# Patient Record
Sex: Female | Born: 1975 | Race: White | Hispanic: No | Marital: Married | Smoking: Former smoker
Health system: Southern US, Community
[De-identification: ages and names within clinical notes are randomized; demographics above are authoritative.]

## PROBLEM LIST (undated history)

## (undated) DIAGNOSIS — L02419 Cutaneous abscess of limb, unspecified: Secondary | ICD-10-CM

## (undated) DIAGNOSIS — T7840XA Allergy, unspecified, initial encounter: Secondary | ICD-10-CM

## (undated) DIAGNOSIS — R52 Pain, unspecified: Secondary | ICD-10-CM

## (undated) DIAGNOSIS — J209 Acute bronchitis, unspecified: Secondary | ICD-10-CM

## (undated) DIAGNOSIS — I219 Acute myocardial infarction, unspecified: Secondary | ICD-10-CM

## (undated) DIAGNOSIS — A419 Sepsis, unspecified organism: Secondary | ICD-10-CM

## (undated) DIAGNOSIS — M797 Fibromyalgia: Secondary | ICD-10-CM

## (undated) DIAGNOSIS — M329 Systemic lupus erythematosus, unspecified: Secondary | ICD-10-CM

## (undated) DIAGNOSIS — I10 Essential (primary) hypertension: Secondary | ICD-10-CM

## (undated) DIAGNOSIS — M1990 Unspecified osteoarthritis, unspecified site: Secondary | ICD-10-CM

## (undated) DIAGNOSIS — K219 Gastro-esophageal reflux disease without esophagitis: Secondary | ICD-10-CM

## (undated) DIAGNOSIS — J45901 Unspecified asthma with (acute) exacerbation: Secondary | ICD-10-CM

## (undated) HISTORY — DX: Allergy, unspecified, initial encounter: T78.40XA

## (undated) HISTORY — PX: KNEE SURGERY: SHX244

## (undated) HISTORY — DX: Essential (primary) hypertension: I10

## (undated) HISTORY — PX: BREAST SURGERY: SHX581

## (undated) HISTORY — PX: COLONOSCOPY WITH ESOPHAGOGASTRODUODENOSCOPY (EGD): SHX5779

## (undated) HISTORY — DX: Pain, unspecified: R52

## (undated) HISTORY — DX: Unspecified asthma with (acute) exacerbation: J45.901

## (undated) HISTORY — DX: Acute myocardial infarction, unspecified: I21.9

## (undated) HISTORY — DX: Sepsis, unspecified organism: A41.9

## (undated) HISTORY — PX: OTHER SURGICAL HISTORY: SHX169

## (undated) HISTORY — PX: ANKLE SURGERY: SHX546

## (undated) HISTORY — PX: APPENDECTOMY: SHX54

## (undated) HISTORY — PX: SHOULDER SURGERY: SHX246

## (undated) HISTORY — DX: Cutaneous abscess of limb, unspecified: L02.419

## (undated) HISTORY — PX: OCCIPITAL NERVE STIMULATOR INSERTION: SHX2095

## (undated) HISTORY — DX: Acute bronchitis, unspecified: J20.9

## (undated) HISTORY — PX: ENDOMETRIAL ABLATION: SHX621

---

## 1997-10-31 ENCOUNTER — Ambulatory Visit (HOSPITAL_BASED_OUTPATIENT_CLINIC_OR_DEPARTMENT_OTHER): Admission: RE | Admit: 1997-10-31 | Discharge: 1997-10-31 | Payer: Self-pay | Admitting: Orthopedic Surgery

## 1998-02-11 HISTORY — PX: TUBAL LIGATION: SHX77

## 1998-02-11 HISTORY — PX: CHOLECYSTECTOMY: SHX55

## 1998-02-14 ENCOUNTER — Emergency Department (HOSPITAL_COMMUNITY): Admission: EM | Admit: 1998-02-14 | Discharge: 1998-02-14 | Payer: Self-pay | Admitting: Emergency Medicine

## 1998-04-21 ENCOUNTER — Ambulatory Visit (HOSPITAL_COMMUNITY): Admission: RE | Admit: 1998-04-21 | Discharge: 1998-04-21 | Payer: Self-pay | Admitting: Obstetrics and Gynecology

## 1998-04-21 ENCOUNTER — Inpatient Hospital Stay (HOSPITAL_COMMUNITY): Admission: AD | Admit: 1998-04-21 | Discharge: 1998-04-21 | Payer: Self-pay | Admitting: Obstetrics and Gynecology

## 1998-04-21 ENCOUNTER — Encounter: Payer: Self-pay | Admitting: Obstetrics and Gynecology

## 1998-04-22 ENCOUNTER — Inpatient Hospital Stay (HOSPITAL_COMMUNITY): Admission: AD | Admit: 1998-04-22 | Discharge: 1998-04-24 | Payer: Self-pay | Admitting: Obstetrics and Gynecology

## 1998-04-23 ENCOUNTER — Encounter: Payer: Self-pay | Admitting: Obstetrics and Gynecology

## 1998-04-25 ENCOUNTER — Inpatient Hospital Stay (HOSPITAL_COMMUNITY): Admission: AD | Admit: 1998-04-25 | Discharge: 1998-04-25 | Payer: Self-pay | Admitting: Obstetrics and Gynecology

## 1998-04-29 ENCOUNTER — Inpatient Hospital Stay (HOSPITAL_COMMUNITY): Admission: AD | Admit: 1998-04-29 | Discharge: 1998-04-29 | Payer: Self-pay | Admitting: Obstetrics & Gynecology

## 1998-05-02 ENCOUNTER — Inpatient Hospital Stay (HOSPITAL_COMMUNITY): Admission: AD | Admit: 1998-05-02 | Discharge: 1998-05-02 | Payer: Self-pay | Admitting: Obstetrics and Gynecology

## 1998-05-08 ENCOUNTER — Observation Stay (HOSPITAL_COMMUNITY): Admission: RE | Admit: 1998-05-08 | Discharge: 1998-05-09 | Payer: Self-pay | Admitting: General Surgery

## 1998-05-24 ENCOUNTER — Observation Stay (HOSPITAL_COMMUNITY): Admission: AD | Admit: 1998-05-24 | Discharge: 1998-05-25 | Payer: Self-pay | Admitting: Obstetrics and Gynecology

## 1998-08-09 ENCOUNTER — Inpatient Hospital Stay (HOSPITAL_COMMUNITY): Admission: AD | Admit: 1998-08-09 | Discharge: 1998-08-09 | Payer: Self-pay | Admitting: Obstetrics & Gynecology

## 1998-08-12 ENCOUNTER — Inpatient Hospital Stay (HOSPITAL_COMMUNITY): Admission: AD | Admit: 1998-08-12 | Discharge: 1998-08-12 | Payer: Self-pay | Admitting: Obstetrics and Gynecology

## 1998-09-05 ENCOUNTER — Inpatient Hospital Stay (HOSPITAL_COMMUNITY): Admission: AD | Admit: 1998-09-05 | Discharge: 1998-09-05 | Payer: Self-pay | Admitting: Obstetrics & Gynecology

## 1998-10-02 ENCOUNTER — Inpatient Hospital Stay (HOSPITAL_COMMUNITY): Admission: AD | Admit: 1998-10-02 | Discharge: 1998-10-02 | Payer: Self-pay | Admitting: Obstetrics & Gynecology

## 1998-10-24 ENCOUNTER — Inpatient Hospital Stay (HOSPITAL_COMMUNITY): Admission: AD | Admit: 1998-10-24 | Discharge: 1998-10-24 | Payer: Self-pay | Admitting: Obstetrics and Gynecology

## 1998-11-11 ENCOUNTER — Inpatient Hospital Stay (HOSPITAL_COMMUNITY): Admission: AD | Admit: 1998-11-11 | Discharge: 1998-11-11 | Payer: Self-pay | Admitting: Obstetrics & Gynecology

## 1998-11-25 ENCOUNTER — Inpatient Hospital Stay (HOSPITAL_COMMUNITY): Admission: AD | Admit: 1998-11-25 | Discharge: 1998-11-25 | Payer: Self-pay | Admitting: Obstetrics & Gynecology

## 1998-11-30 ENCOUNTER — Inpatient Hospital Stay (HOSPITAL_COMMUNITY): Admission: AD | Admit: 1998-11-30 | Discharge: 1998-12-03 | Payer: Self-pay | Admitting: Obstetrics & Gynecology

## 1999-01-01 ENCOUNTER — Emergency Department (HOSPITAL_COMMUNITY): Admission: EM | Admit: 1999-01-01 | Discharge: 1999-01-01 | Payer: Self-pay | Admitting: Emergency Medicine

## 1999-03-08 ENCOUNTER — Ambulatory Visit (HOSPITAL_COMMUNITY): Admission: RE | Admit: 1999-03-08 | Discharge: 1999-03-08 | Payer: Self-pay | Admitting: Gastroenterology

## 2001-04-21 ENCOUNTER — Ambulatory Visit (HOSPITAL_COMMUNITY): Admission: RE | Admit: 2001-04-21 | Discharge: 2001-04-21 | Payer: Self-pay | Admitting: Gastroenterology

## 2001-07-15 ENCOUNTER — Emergency Department (HOSPITAL_COMMUNITY): Admission: EM | Admit: 2001-07-15 | Discharge: 2001-07-15 | Payer: Self-pay | Admitting: Emergency Medicine

## 2001-07-15 ENCOUNTER — Encounter: Payer: Self-pay | Admitting: Emergency Medicine

## 2002-02-12 ENCOUNTER — Other Ambulatory Visit: Admission: RE | Admit: 2002-02-12 | Discharge: 2002-02-12 | Payer: Self-pay | Admitting: Obstetrics and Gynecology

## 2002-08-19 ENCOUNTER — Encounter: Admission: RE | Admit: 2002-08-19 | Discharge: 2002-08-19 | Payer: Self-pay | Admitting: Gastroenterology

## 2002-08-19 ENCOUNTER — Encounter: Payer: Self-pay | Admitting: Gastroenterology

## 2003-07-01 ENCOUNTER — Ambulatory Visit (HOSPITAL_COMMUNITY): Admission: RE | Admit: 2003-07-01 | Discharge: 2003-07-01 | Payer: Self-pay | Admitting: Gastroenterology

## 2003-07-01 ENCOUNTER — Encounter (INDEPENDENT_AMBULATORY_CARE_PROVIDER_SITE_OTHER): Payer: Self-pay | Admitting: Specialist

## 2004-02-28 ENCOUNTER — Other Ambulatory Visit: Admission: RE | Admit: 2004-02-28 | Discharge: 2004-02-28 | Payer: Self-pay | Admitting: Obstetrics and Gynecology

## 2004-03-23 ENCOUNTER — Ambulatory Visit (HOSPITAL_COMMUNITY): Admission: RE | Admit: 2004-03-23 | Discharge: 2004-03-23 | Payer: Self-pay | Admitting: Obstetrics and Gynecology

## 2004-03-23 ENCOUNTER — Encounter (INDEPENDENT_AMBULATORY_CARE_PROVIDER_SITE_OTHER): Payer: Self-pay | Admitting: *Deleted

## 2004-04-22 ENCOUNTER — Emergency Department (HOSPITAL_COMMUNITY): Admission: EM | Admit: 2004-04-22 | Discharge: 2004-04-22 | Payer: Self-pay | Admitting: Emergency Medicine

## 2004-08-01 ENCOUNTER — Ambulatory Visit: Payer: Self-pay | Admitting: Pain Medicine

## 2004-08-23 ENCOUNTER — Ambulatory Visit: Payer: Self-pay | Admitting: Pain Medicine

## 2004-09-10 ENCOUNTER — Ambulatory Visit: Payer: Self-pay | Admitting: Pain Medicine

## 2004-09-10 ENCOUNTER — Ambulatory Visit: Payer: Self-pay | Admitting: Physician Assistant

## 2004-09-20 ENCOUNTER — Ambulatory Visit: Payer: Self-pay | Admitting: Pain Medicine

## 2004-10-16 ENCOUNTER — Ambulatory Visit: Payer: Self-pay | Admitting: Physician Assistant

## 2004-10-25 ENCOUNTER — Ambulatory Visit: Payer: Self-pay | Admitting: Pain Medicine

## 2004-11-15 ENCOUNTER — Ambulatory Visit: Payer: Self-pay | Admitting: Physician Assistant

## 2004-11-28 ENCOUNTER — Ambulatory Visit: Payer: Self-pay | Admitting: Pain Medicine

## 2004-12-23 ENCOUNTER — Emergency Department (HOSPITAL_COMMUNITY): Admission: EM | Admit: 2004-12-23 | Discharge: 2004-12-23 | Payer: Self-pay | Admitting: Emergency Medicine

## 2005-01-31 ENCOUNTER — Ambulatory Visit: Payer: Self-pay | Admitting: Pain Medicine

## 2005-02-21 ENCOUNTER — Ambulatory Visit: Payer: Self-pay | Admitting: Physician Assistant

## 2005-02-28 ENCOUNTER — Ambulatory Visit: Payer: Self-pay | Admitting: Pain Medicine

## 2005-03-15 ENCOUNTER — Ambulatory Visit: Payer: Self-pay | Admitting: Physician Assistant

## 2005-03-28 ENCOUNTER — Ambulatory Visit: Payer: Self-pay | Admitting: Pain Medicine

## 2005-04-25 ENCOUNTER — Ambulatory Visit: Payer: Self-pay | Admitting: Physician Assistant

## 2005-05-22 ENCOUNTER — Ambulatory Visit: Payer: Self-pay | Admitting: Physician Assistant

## 2005-05-26 ENCOUNTER — Emergency Department (HOSPITAL_COMMUNITY): Admission: EM | Admit: 2005-05-26 | Discharge: 2005-05-26 | Payer: Self-pay | Admitting: Emergency Medicine

## 2005-06-11 ENCOUNTER — Ambulatory Visit: Payer: Self-pay | Admitting: Gastroenterology

## 2005-07-15 ENCOUNTER — Ambulatory Visit: Payer: Self-pay | Admitting: Gastroenterology

## 2005-08-22 ENCOUNTER — Ambulatory Visit (HOSPITAL_BASED_OUTPATIENT_CLINIC_OR_DEPARTMENT_OTHER): Admission: RE | Admit: 2005-08-22 | Discharge: 2005-08-22 | Payer: Self-pay | Admitting: Urology

## 2005-08-22 ENCOUNTER — Encounter (INDEPENDENT_AMBULATORY_CARE_PROVIDER_SITE_OTHER): Payer: Self-pay | Admitting: Specialist

## 2005-08-30 ENCOUNTER — Ambulatory Visit: Payer: Self-pay | Admitting: Physician Assistant

## 2005-09-16 ENCOUNTER — Ambulatory Visit: Payer: Self-pay | Admitting: Physician Assistant

## 2005-09-18 ENCOUNTER — Ambulatory Visit: Payer: Self-pay | Admitting: Physician Assistant

## 2005-09-19 ENCOUNTER — Ambulatory Visit: Payer: Self-pay | Admitting: Pain Medicine

## 2005-10-01 ENCOUNTER — Ambulatory Visit: Payer: Self-pay | Admitting: Physician Assistant

## 2005-10-10 ENCOUNTER — Ambulatory Visit: Payer: Self-pay | Admitting: Pain Medicine

## 2005-10-21 ENCOUNTER — Ambulatory Visit: Payer: Self-pay | Admitting: Pain Medicine

## 2005-10-28 ENCOUNTER — Ambulatory Visit: Payer: Self-pay | Admitting: Pain Medicine

## 2005-11-26 ENCOUNTER — Ambulatory Visit: Payer: Self-pay | Admitting: Physician Assistant

## 2005-11-28 ENCOUNTER — Ambulatory Visit: Payer: Self-pay | Admitting: Pain Medicine

## 2005-12-04 ENCOUNTER — Ambulatory Visit: Payer: Self-pay | Admitting: Pain Medicine

## 2005-12-05 ENCOUNTER — Ambulatory Visit: Payer: Self-pay | Admitting: Pain Medicine

## 2005-12-11 ENCOUNTER — Ambulatory Visit: Payer: Self-pay | Admitting: Pain Medicine

## 2006-01-15 ENCOUNTER — Ambulatory Visit: Payer: Self-pay | Admitting: Pain Medicine

## 2006-01-23 ENCOUNTER — Ambulatory Visit: Payer: Self-pay | Admitting: Pain Medicine

## 2006-02-11 HISTORY — PX: SPINAL CORD STIMULATOR IMPLANT: SHX2422

## 2006-06-20 ENCOUNTER — Ambulatory Visit: Payer: Self-pay | Admitting: Physician Assistant

## 2006-07-09 ENCOUNTER — Ambulatory Visit: Payer: Self-pay | Admitting: Pain Medicine

## 2006-07-10 ENCOUNTER — Ambulatory Visit: Payer: Self-pay | Admitting: Pain Medicine

## 2006-07-25 ENCOUNTER — Ambulatory Visit: Payer: Self-pay | Admitting: Physician Assistant

## 2006-08-28 ENCOUNTER — Ambulatory Visit: Payer: Self-pay | Admitting: Pain Medicine

## 2006-09-22 ENCOUNTER — Ambulatory Visit: Payer: Self-pay | Admitting: Pain Medicine

## 2006-10-06 ENCOUNTER — Ambulatory Visit: Payer: Self-pay | Admitting: Unknown Physician Specialty

## 2006-10-22 ENCOUNTER — Ambulatory Visit: Payer: Self-pay | Admitting: Pain Medicine

## 2006-11-19 ENCOUNTER — Ambulatory Visit: Payer: Self-pay | Admitting: Pain Medicine

## 2006-12-23 ENCOUNTER — Ambulatory Visit (HOSPITAL_BASED_OUTPATIENT_CLINIC_OR_DEPARTMENT_OTHER): Admission: RE | Admit: 2006-12-23 | Discharge: 2006-12-23 | Payer: Self-pay | Admitting: Urology

## 2007-02-20 ENCOUNTER — Ambulatory Visit: Payer: Self-pay | Admitting: Physician Assistant

## 2007-03-20 ENCOUNTER — Ambulatory Visit: Payer: Self-pay | Admitting: Physician Assistant

## 2007-04-16 ENCOUNTER — Ambulatory Visit: Payer: Self-pay | Admitting: Pain Medicine

## 2007-04-30 ENCOUNTER — Ambulatory Visit: Payer: Self-pay | Admitting: Pain Medicine

## 2007-05-11 ENCOUNTER — Ambulatory Visit: Payer: Self-pay | Admitting: Pain Medicine

## 2007-05-21 ENCOUNTER — Ambulatory Visit: Payer: Self-pay | Admitting: Pain Medicine

## 2007-06-15 ENCOUNTER — Ambulatory Visit: Payer: Self-pay | Admitting: Pain Medicine

## 2007-07-02 DIAGNOSIS — Z87442 Personal history of urinary calculi: Secondary | ICD-10-CM | POA: Insufficient documentation

## 2007-07-02 DIAGNOSIS — K582 Mixed irritable bowel syndrome: Secondary | ICD-10-CM

## 2007-07-02 DIAGNOSIS — K219 Gastro-esophageal reflux disease without esophagitis: Secondary | ICD-10-CM

## 2007-07-02 DIAGNOSIS — K589 Irritable bowel syndrome without diarrhea: Secondary | ICD-10-CM | POA: Insufficient documentation

## 2007-07-02 HISTORY — DX: Gastro-esophageal reflux disease without esophagitis: K21.9

## 2007-07-02 HISTORY — DX: Personal history of urinary calculi: Z87.442

## 2007-07-02 HISTORY — DX: Mixed irritable bowel syndrome: K58.2

## 2007-07-20 ENCOUNTER — Ambulatory Visit: Payer: Self-pay | Admitting: Pain Medicine

## 2007-08-13 ENCOUNTER — Ambulatory Visit: Payer: Self-pay | Admitting: Physician Assistant

## 2007-08-18 ENCOUNTER — Ambulatory Visit: Payer: Self-pay | Admitting: Pain Medicine

## 2007-08-20 ENCOUNTER — Ambulatory Visit: Payer: Self-pay | Admitting: Pain Medicine

## 2007-08-25 ENCOUNTER — Ambulatory Visit: Payer: Self-pay | Admitting: Pain Medicine

## 2007-09-08 ENCOUNTER — Ambulatory Visit: Payer: Self-pay | Admitting: Pain Medicine

## 2007-09-17 ENCOUNTER — Ambulatory Visit: Payer: Self-pay | Admitting: Physician Assistant

## 2007-09-18 ENCOUNTER — Ambulatory Visit: Payer: Self-pay | Admitting: Gastroenterology

## 2007-10-13 ENCOUNTER — Ambulatory Visit: Payer: Self-pay | Admitting: Physician Assistant

## 2007-12-14 ENCOUNTER — Ambulatory Visit: Payer: Self-pay | Admitting: Pain Medicine

## 2008-01-12 ENCOUNTER — Ambulatory Visit: Payer: Self-pay | Admitting: Physician Assistant

## 2008-03-10 ENCOUNTER — Ambulatory Visit: Payer: Self-pay | Admitting: Physician Assistant

## 2008-06-08 ENCOUNTER — Ambulatory Visit: Payer: Self-pay | Admitting: Physician Assistant

## 2008-06-23 ENCOUNTER — Ambulatory Visit: Payer: Self-pay | Admitting: Pain Medicine

## 2008-07-07 ENCOUNTER — Ambulatory Visit: Payer: Self-pay | Admitting: Physician Assistant

## 2008-07-14 ENCOUNTER — Ambulatory Visit: Payer: Self-pay | Admitting: Pain Medicine

## 2008-08-03 ENCOUNTER — Ambulatory Visit: Payer: Self-pay | Admitting: Physician Assistant

## 2008-08-12 ENCOUNTER — Ambulatory Visit: Payer: Self-pay | Admitting: Pain Medicine

## 2008-11-02 ENCOUNTER — Ambulatory Visit: Payer: Self-pay | Admitting: Physician Assistant

## 2008-11-22 ENCOUNTER — Ambulatory Visit: Payer: Self-pay | Admitting: Pain Medicine

## 2008-12-08 ENCOUNTER — Ambulatory Visit: Payer: Self-pay | Admitting: Physician Assistant

## 2009-03-09 ENCOUNTER — Ambulatory Visit: Payer: Self-pay | Admitting: Physician Assistant

## 2009-03-21 ENCOUNTER — Ambulatory Visit: Payer: Self-pay | Admitting: Pain Medicine

## 2009-04-06 ENCOUNTER — Ambulatory Visit: Payer: Self-pay | Admitting: Pain Medicine

## 2009-07-20 ENCOUNTER — Ambulatory Visit: Payer: Self-pay | Admitting: Pain Medicine

## 2009-08-17 ENCOUNTER — Ambulatory Visit: Payer: Self-pay | Admitting: Pain Medicine

## 2010-01-10 ENCOUNTER — Ambulatory Visit: Payer: Self-pay | Admitting: Pain Medicine

## 2010-01-23 ENCOUNTER — Ambulatory Visit: Payer: Self-pay | Admitting: Pain Medicine

## 2010-04-04 ENCOUNTER — Ambulatory Visit: Payer: Self-pay | Admitting: Pain Medicine

## 2010-06-26 NOTE — Op Note (Signed)
NAME:  Mary Cruz, Mary Cruz NO.:  [id number]   MEDICAL RECORD NO.:  [id number]          PATIENT TYPE:  AMB   LOCATION:  [facility]                         FACILITY:  [facility]   PHYSICIAN:  Jamison Neighbor, M.D.  DATE OF BIRTH:  17-Mar-1975   DATE OF PROCEDURE:  12/23/2006  DATE OF DISCHARGE:                               OPERATIVE REPORT   PREOPERATIVE DIAGNOSIS:  Interstitial cystitis.   SECONDARY DIAGNOSIS:  Urgency incontinence.   POSTOPERATIVE DIAGNOSIS:  Urgency incontinence.   PROCEDURES:  1. Cystoscopy.  2. Urethral calibration.  3. Hydrodistention of bladder with Marcaine and Pyridium installation.  4. Marcaine and Kenalog injection.  5. Botox injection (2 ampules).   SURGEON:  Jamison Neighbor, M.D.   ANESTHESIA:  General.   COMPLICATIONS:  None.   DRAINS:  None.   BRIEF HISTORY:  This 35 year old female is known to have interstitial  cystitis.  She has severe problems with frequency and urgency that have  worsened.  The last time she had a hydrodistention over a year ago, she  had a good response; however, over time that has worn off.  She has not  done very well with Elmiron for her IC.  She found that Reunion and  Neurontin were not much help for her urgency and frequency, and she has  not responded to other medications.  She has requested cystoscopy and  hydrodistention be done and would like to have a Botox injection as  well.  She knows about other options such as InterStim.  She does know  about the potential risk in terms of retention.  She gave full informed  consent.   PROCEDURE:  After successful induction of general anesthesia, the  patient was placed in the dorsal lithotomy position; prepped with  Betadine and draped in the usual sterile fashion.  Careful bimanual  examination revealed minimal pelvic laxity, but no real prolapse.  The  vault was all supported.  She did not have a cystocele or a rectocele to  speak of.  The urethra was  calibrated at 32-French with female urethral  sounds, with no evidence of stenosis or stricture.  The cystoscope was  inserted.  The bladder was carefully inspected.  No tumors or stones  could be identified.  Hydrodistention of the bladder was performed.  The  bladder was distended at a pressure of 100 cm of water for 5 minutes.  When the bladder was drained the bladder capacity of 650 mL was very  comparable to the average IC capacity of 575, but much less than normal  bladder capacity of 1150.  The patient had glomerulations, although they  were relatively modest.  There were no ulcers and nothing that required  biopsy.  The patient then underwent Botox injection; a total of 20  injections utilizing 2 ampules was performed.  The patient had a mixture  of  Marcaine and Pyridium left in the bladder; Marcaine and Kenalog were  injected periurethrally.  The patient tolerated procedure and was taken  to the recovery room in good condition.  She will be sent home with  Tylox, Pyridium Plus  and doxycycline.  Will return to see me in follow-  up.      Jamison Neighbor, M.D.  Electronically Signed     RJE/MEDQ  D:  12/23/2006  T:  12/23/2006  Job:  [id number]

## 2010-06-29 NOTE — Op Note (Signed)
NAME:  Mary Cruz, Mary Cruz NO.:  [id number]   MEDICAL RECORD NO.:  [id number]          PATIENT TYPE:  AMB   LOCATION:  [facility]                         FACILITY:  [facility]   PHYSICIAN:  Jamison Neighbor, M.D.  DATE OF BIRTH:  1975/08/10   DATE OF PROCEDURE:  08/22/2005  DATE OF DISCHARGE:                                 OPERATIVE REPORT   PREOPERATIVE DIAGNOSIS:  Interstitial cystitis.   POSTOPERATIVE DIAGNOSIS:  Interstitial cystitis.   PROCEDURE:  Cystoscopy, urethral calibration, hydrodistention of the  bladder, bladder biopsy, Marcaine and Pyridium instillation, Marcaine and  Kenalog injection.   SURGEON:  Marcelyn Bruins, M.D.   ANESTHESIA:  General.   COMPLICATIONS:  None.   DRAINS:  None.   BRIEF HISTORY:  This 35 year old female had been diagnosed with interstitial  cystitis made on clinical grounds, as well as supra potassium testing.  The  patient had been treated quite aggressively with instillation therapy, as  well as a multimode oral approach including Elmiron, Neurontin, muscle  relaxants, and Sanctura, as well as antihistamine therapy.  The patient  feels she is not responding and wishes to undergo additional diagnostic  evaluation.  We have agreed to perform a cystoscopy and hydrodistention to  confirm her diagnosis and, hopefully, give her some relief.  She is aware of  the fact that there is only a 50% to 60% chance that she will have  improvement from the hydrodistention and then if she does have improvement,  it may not last long term.  She gave full informed consent for the  procedure.   PROCEDURE:  After successful induction of general anesthesia, the patient  was placed in the dorsal lithotomy position, prepped with Betadine and  draped in the usual sterile fashion.  Careful bimanual examination revealed  no abnormalities of the urethra. There was no cystocele, rectocele, and no  mass on bimanual exam.  The uterus was palpably normal.  The  urethra was  dilated to 32-French with urethral sounds with no evidence of stenosis or  stricture.  The cystoscope was inserted.  The bladder was carefully  inspected.  No tumors or stones could be seen.  Ureteral orifices were  identified and were unremarkable in their appearance.  Clear urine was seen  to efflux from each.  Hydrodistention of the bladder was then performed.  The bladder was distended at a pressure of 100 cmH2O for 5 minutes.  When  the bladder was drained, the patient had glomerulations throughout the  bladder.  There was a terminal blood tinge to the drain out.  Bladder  capacity was 600 cc.  There were no ulcers identified.  The bladder capacity  of 600 was near identical to the average bladder capacity of 575 for the  interstitial cystitis population and significantly less than normal bladder  capacity which should be between 1100 and 1200 cc.  A biopsy was taken.  The  biopsy site was cauterized.  This will be sent for mast cell analysis.  The  bladder was drained.  A mixture of Marcaine and Pyridium was left in the  bladder. Marcaine and  Kenalog were injected periurethrally.  The patient  received intraoperative B&S suppository, as well as Toradol and Zofran.  The  patient tolerated the procedure and was taken to the recovery room in good  condition.  She will be sent home with a prescription for Pyridium Plus as  well as doxycycline for a short course.  Because the patient has multiple  allergies to pain medication, we will give her Demerol.  It is one of the  medications she can take for pain.  She will be asked to take it sparingly  on an as-needed basis for severe pain.           ______________________________  Jamison Neighbor, M.D.  Electronically Signed     RJE/MEDQ  D:  08/22/2005  T:  08/22/2005  Job:  [id number]

## 2010-06-29 NOTE — Op Note (Signed)
NAME:  Mary Cruz, Mary Cruz NO.:  [id number]   MEDICAL RECORD NO.:  [id number]          PATIENT TYPE:  AMB   LOCATION:  [facility]                           FACILITY:  [facility]   PHYSICIAN:  Michelle L. Grewal, M.D.DATE OF BIRTH:  February 13, 1975   DATE OF PROCEDURE:  03/23/2004  DATE OF DISCHARGE:                                 OPERATIVE REPORT   PREOPERATIVE DIAGNOSIS:  Menorrhagia and endometrial polyp.   POSTOPERATIVE DIAGNOSIS:  Menorrhagia and endometrial polyp.   PROCEDURES:  Dilatation and curettage, hysteroscopy, and ThermaChoice  endometrial ablation.   SURGEON:  Michelle L. Vincente Poli, M.D.   ANESTHESIA:  MAC with local.   SPECIMENS:  Uterine curettings.   ESTIMATED BLOOD LOSS:  Minimal.   COMPLICATIONS:  None.   PROCEDURE:  The patient was taken to the operating room where she was given  sedation.  She was placed in the low lithotomy position and prepped and  draped in the usual sterile fashion.  Speculum was inserted into the vagina.  The cervix was grasped with the tenaculum and in-and-out catheter being used  to empty the bladder.  A paracervical block was performed in the standard  fashion.  The cervical internal os was gently dilated using Pratt dilators.  The uterus was sounded to 8 cm.  The diagnostic hysteroscope was inserted  into the uterine cavity and, with great visualization, the endometrium  visualized.  There was a small, little area of polypoid tissue in the  posterior wall of the uterus; otherwise, the uterus was clean.  The  hysteroscope was removed and a thorough sharp curettage was performed and  all tissue sent to pathology.  At this point, we inserted the ThermaChoice  balloon, and a ThermaChoice III endometrial ablation was performed in the  standard fashion according to the manufacturer's specification.  The intact  balloon was removed at the end of the ablation sequence.  All instruments  were removed from the vagina.  There was no uterine  bleeding noted.  The  patient was given Toradol and taken to the recovery room in stable  condition.  All sponge, lap, and instrument counts were correct x 2.      MLG/MEDQ  D:  03/23/2004  T:  03/24/2004  Job:  [id number]

## 2010-06-29 NOTE — Procedures (Signed)
[place]  Patient:    SEDALIA, GREESON Visit Number: [id number] MRN: [id number]          Service Type: END Location: ENDO Attending Physician:  Charna Elizabeth Dictated by:   Anselmo Rod, M.D. Proc. Date: 04/21/01 Admit Date:  04/21/2001   CC:         Earlene Plater L. Cloward, M.D.  [facility], [place], [place]   Procedure Report  DATE OF BIRTH:  [birth year].  PROCEDURE:  Colonoscopy.  ENDOSCOPIST:  Anselmo Rod, M.D.  INSTRUMENT USED:  Pediatric Olympus colonoscope.  INDICATIONS:  A 35 year old white female with a history of diarrhea, abdominal pain and occasional bright red blood per rectum, rule out IBD.  PREPROCEDURE PREPARATION:  Informed consent was procured from the patient and the patient fasted for 8 hours prior to procedure and prepped with a bottle of magnesium citrate and a gallon of NuLYTELY the night prior to the procedure.  PREPROCEDURE PHYSICAL EXAMINATION:  VITAL SIGNS:  The patient with stable vital signs.  NECK:  Supple.  LUNGS:  Chest clear to auscultation.  CARDIOVASCULAR:  S1 and S2, regular.  ABDOMEN:  Soft with normal bowel sounds.  DESCRIPTION OF PROCEDURE:  The patient was placed in the left lateral decubitus position and sedated with 80 mg of Demerol and 8 mg of Versed intravenously. Once the patient was adequately sedated, maintained on low flow oxygen, and continuous cardiac monitoring, the Olympus video colonoscope was advanced from the rectum to the cecum and terminal ileum with difficulty. The patient had significant abdominal discomfort with insufflation of the air into the colon, indicating some component of visceral hypersensitivity. There was extensive compression of the cecum and questionable fibroid; no diverticulosis, masses, polyps, erosions or ulcerations was seen. The terminal ileum appeared healthy and without lesion.  IMPRESSION: 1. Normal appearing colon and terminal ileum. 2.  Extrinsic compression of the cecum. 3. 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  RECOMMENDATIONS: 1. Continue a high fiber diet. 2. Antispasmodics p.r.n. 3. Outpatient follow-up in the next two weeks. Dictated by:   Anselmo Rod, M.D. Attending Physician:  Charna Elizabeth DD:  04/21/01 TD:  04/22/01 Job: [id number]

## 2010-06-29 NOTE — Op Note (Signed)
NAME:  Mary Cruz, Mary Cruz NO.:  [id number]   MEDICAL RECORD NO.:  [id number]                   PATIENT TYPE:  AMB   LOCATION:  ENDO                                 FACILITY:  [facility]   PHYSICIAN:  Anselmo Rod, M.D.               DATE OF BIRTH:  01-06-1976   DATE OF PROCEDURE:  07/01/2003  DATE OF DISCHARGE:                                 OPERATIVE REPORT   PROCEDURE PERFORMED:  Esophagogastroduodenoscopy with small bowel biopsies.   ENDOSCOPIST:  Anselmo Rod, M.D.   INSTRUMENT USED:  Olympus video panendoscope.   INDICATION FOR PROCEDURE:  A 35 year old white female with a history of  severe abdominal pain, chest pain, and periodic diarrhea undergoing an EGD  to rule out peptic ulcer disease, esophagitis, gastritis, etc.  The patient  also is going to have small bowel biopsies done to rule out sprue.   PREPROCEDURE PREPARATION:  Informed consent was procured from the patient.  The patient was fasted for 8 hours prior to the procedure.   PREPROCEDURE PHYSICAL EXAMINATION:  VITAL SIGNS:  The patient with stable  vital signs.  NECK:  Supple.  CHEST:  Clear to auscultation.  CARDIAC:  S1, S2, regular.  ABDOMEN:  Soft with normal bowel sounds.   DESCRIPTION OF PROCEDURE:  The patient was placed in the left lateral  decubitus position, sedated with 70 mg of Demerol and 8 mg of Versed  intravenously.  This was done in slow, incremental doses.  Once the patient  was adequately sedated and maintained on low-flow oxygen and continuous  cardiac monitoring, the Olympus video panendoscope was advanced through the  mouth piece, over the tongue, into the esophagus under direct vision.  The  entire esophagus appeared normal with no evidence of ring, stricture,  masses, esophagitis, or Barrett's mucosa.  A small hiatal hernia was seen on  high retroflexion.  The rest of the gastric mucosa and the proximal small  bowel appeared normal.  Small bowel biopsies  were done to rule out sprue.  There was no outlet obstruction.  No erosions, ulcerations, masses, or  polyps were seen.  The patient tolerated the procedure well without  complications.   IMPRESSION:  1. Normal EGD except for a small hiatal hernia.  2. Small bowel biopsies done to rule out sprue.   RECOMMENDATIONS:  1. Continue proton pump inhibitors and Carafate.  2. Avoid nonsteroidals including aspirin for now.  3. Await pathology results.  4. Outpatient follow up in the next 2 weeks.  Follow up with Dr. Barbee Shropshire in     the next couple of days for further evaluation of chest pressure and     pain.  Anselmo Rod, M.D.    JNM/MEDQ  D:  07/01/2003  T:  07/02/2003  Job:  [id number]   cc:   Olene Craven, M.D.  [address]  [address]  [place]  [place] [address]  Fax: [phone]

## 2010-07-02 ENCOUNTER — Ambulatory Visit: Payer: Self-pay | Admitting: Pain Medicine

## 2010-07-10 ENCOUNTER — Ambulatory Visit: Payer: Self-pay | Admitting: Pain Medicine

## 2010-10-24 ENCOUNTER — Ambulatory Visit: Payer: Self-pay | Admitting: Pain Medicine

## 2010-11-20 LAB — POCT PREGNANCY, URINE: Preg Test, Ur: NEGATIVE

## 2010-11-20 LAB — POCT HEMOGLOBIN-HEMACUE: Operator id: 268271

## 2010-12-06 ENCOUNTER — Ambulatory Visit: Payer: Self-pay | Admitting: Pain Medicine

## 2011-02-13 ENCOUNTER — Ambulatory Visit: Payer: Self-pay | Admitting: Pain Medicine

## 2011-02-28 ENCOUNTER — Ambulatory Visit: Payer: Self-pay | Admitting: Pain Medicine

## 2011-06-26 ENCOUNTER — Ambulatory Visit: Payer: Self-pay | Admitting: Pain Medicine

## 2011-10-24 ENCOUNTER — Ambulatory Visit: Payer: Self-pay | Admitting: Pain Medicine

## 2011-11-12 ENCOUNTER — Ambulatory Visit: Payer: Self-pay | Admitting: Pain Medicine

## 2011-12-11 ENCOUNTER — Ambulatory Visit: Payer: Self-pay | Admitting: Pain Medicine

## 2011-12-24 ENCOUNTER — Ambulatory Visit: Payer: Self-pay | Admitting: Pain Medicine

## 2012-02-18 ENCOUNTER — Ambulatory Visit: Payer: Self-pay | Admitting: Pain Medicine

## 2012-06-26 ENCOUNTER — Other Ambulatory Visit: Payer: Self-pay | Admitting: Pain Medicine

## 2012-06-26 ENCOUNTER — Ambulatory Visit: Payer: Self-pay | Admitting: Pain Medicine

## 2012-06-26 LAB — MAGNESIUM: Magnesium: 1.8 mg/dL

## 2012-07-07 ENCOUNTER — Ambulatory Visit: Payer: Self-pay | Admitting: Pain Medicine

## 2012-07-09 ENCOUNTER — Ambulatory Visit: Payer: Self-pay | Admitting: Pain Medicine

## 2012-08-06 ENCOUNTER — Ambulatory Visit: Payer: Self-pay | Admitting: Pain Medicine

## 2012-10-21 ENCOUNTER — Other Ambulatory Visit: Payer: Self-pay | Admitting: Pain Medicine

## 2012-10-21 ENCOUNTER — Ambulatory Visit: Payer: Self-pay | Admitting: Pain Medicine

## 2012-10-21 LAB — BASIC METABOLIC PANEL
BUN: 8 mg/dL (ref 7–18)
Chloride: 105 mmol/L (ref 98–107)
Creatinine: 0.95 mg/dL (ref 0.60–1.30)
EGFR (Non-African Amer.): >60
Osmolality: 274 (ref 275–301)
Potassium: 3.3 mmol/L — ABNORMAL LOW (ref 3.5–5.1)
Sodium: 138 mmol/L (ref 136–145)

## 2012-10-21 LAB — MAGNESIUM: Magnesium: 1.8 mg/dL

## 2012-12-01 ENCOUNTER — Ambulatory Visit: Payer: Self-pay | Admitting: Pain Medicine

## 2013-01-20 ENCOUNTER — Ambulatory Visit: Payer: Self-pay | Admitting: Pain Medicine

## 2013-02-19 ENCOUNTER — Ambulatory Visit: Payer: Self-pay | Admitting: Pain Medicine

## 2013-04-24 ENCOUNTER — Emergency Department (HOSPITAL_COMMUNITY)
Admission: EM | Admit: 2013-04-24 | Discharge: 2013-04-25 | Disposition: A | Discharge status: Home / Self Care | Payer: Managed Care, Other (non HMO) | Attending: Emergency Medicine | Admitting: Emergency Medicine

## 2013-04-24 DIAGNOSIS — R51 Headache: Secondary | ICD-10-CM | POA: Insufficient documentation

## 2013-04-24 DIAGNOSIS — R0982 Postnasal drip: Secondary | ICD-10-CM | POA: Insufficient documentation

## 2013-04-24 DIAGNOSIS — J3489 Other specified disorders of nose and nasal sinuses: Secondary | ICD-10-CM | POA: Insufficient documentation

## 2013-04-24 DIAGNOSIS — R509 Fever, unspecified: Secondary | ICD-10-CM | POA: Insufficient documentation

## 2013-04-24 DIAGNOSIS — J020 Streptococcal pharyngitis: Secondary | ICD-10-CM | POA: Insufficient documentation

## 2013-04-25 ENCOUNTER — Encounter (HOSPITAL_COMMUNITY): Payer: Self-pay | Admitting: Emergency Medicine

## 2013-04-25 LAB — RAPID STREP SCREEN (MED CTR MEBANE ONLY): STREPTOCOCCUS, GROUP A SCREEN (DIRECT): POSITIVE — AB

## 2013-04-25 MED ORDER — GUAIFENESIN ER 600 MG PO TB12: 600.0000 mg | ORAL_TABLET | Freq: Two times a day (BID) | ORAL | Status: DC

## 2013-04-25 MED ORDER — IBUPROFEN 800 MG PO TABS
800.0000 mg | ORAL_TABLET | Freq: Once | ORAL | Status: AC
  Administered 2013-04-25: 800 mg via ORAL
  Filled 2013-04-25: qty 1

## 2013-04-25 MED ORDER — LIDOCAINE VISCOUS 2 % MT SOLN
15.0000 mL | Freq: Once | OROMUCOSAL | Status: AC
  Administered 2013-04-25: 15 mL via OROMUCOSAL
  Filled 2013-04-25: qty 15

## 2013-04-25 MED ORDER — FLUCONAZOLE 200 MG PO TABS: 200.0000 mg | ORAL_TABLET | Freq: Every day | ORAL | Status: AC

## 2013-04-25 MED ORDER — CETIRIZINE-PSEUDOEPHEDRINE ER 5-120 MG PO TB12: 1.0000 | ORAL_TABLET | Freq: Two times a day (BID) | ORAL | Status: DC

## 2013-04-25 MED ORDER — AMOXICILLIN 500 MG PO CAPS: 500.0000 mg | ORAL_CAPSULE | Freq: Three times a day (TID) | ORAL | Status: DC

## 2013-04-25 NOTE — ED Notes (Signed)
Started yesterday with headache,  Sore throat , headache,  Fever  Back pain

## 2013-04-25 NOTE — ED Notes (Signed)
Pt present to ED with flu like sx's since Friday. Has generalized body aches, sore throat and headache. Denies productive cough, N/V.

## 2013-04-25 NOTE — ED Provider Notes (Signed)
CSN: [id number]     Arrival date & time 04/24/13  2326 History   First MD Initiated Contact with Patient 04/25/13 0055     Chief Complaint  Patient presents with  . Fever  . Sore Throat  . Generalized Body Aches  . Otalgia   HPI  History provided by the patient. 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 Symptoms first began with some sore throat and generalized bodyaches Friday evening. She felt worse Saturday all day. She did take Advil in doses of Tylenol cold and sinus without significant improvement of symptoms. She reports fever up to 101 at home. Also reports having sore throat worse with any swallowing. She does report some sinus congestion and pressure with postnasal drainage. No cough. No chest pain or shortness of breath. No neck pain or stiffness. No rash of the skin. No other aggravating or alleviating factors. No other associated symptoms.    No past medical history on file. Past Surgical History  Procedure Laterality Date  . Spinal cord stimulator     No family history on file. History  Substance Use Topics  . Smoking status: Not on file  . Smokeless tobacco: Not on file  . Alcohol Use: No   OB History   Grav Para Term Preterm Abortions TAB SAB Ect Mult Living                 Review of Systems  Constitutional: Positive for fever and chills.  HENT: Positive for congestion, rhinorrhea and sore throat.   Respiratory: Negative for cough and shortness of breath.   Gastrointestinal: Negative for vomiting and diarrhea.  Neurological: Positive for headaches.  All other systems reviewed and are negative.      Allergies  Codeine; Erythromycin; Hydromorphone hcl; Ketorolac tromethamine; Nabumetone; and Pentazocine lactate  Home Medications  No current outpatient prescriptions on file. BP 131/69   Pulse 113  Temp(Src) 100.4 F (38 C) (Oral)  Resp 20  Wt 196 lb (88.905 kg)  SpO2 99% Physical Exam  Nursing note and vitals reviewed. Constitutional: She is oriented to person, place, and time. She appears well-developed and well-nourished. No distress.  HENT:  Head: Normocephalic and atraumatic.  Right Ear: Tympanic membrane normal.  Left Ear: Tympanic membrane normal.  There is irritation to the posterior pharynx. Small amount of left-sided postnasal mucus and drainage. Tonsils appear normal without enlargement, erythema or exudate. Uvula midline.  Neck: Normal range of motion. Neck supple.  No meningeal signs  Cardiovascular: Normal rate and regular rhythm.   Pulmonary/Chest: Effort normal and breath sounds normal. No respiratory distress. She has no wheezes. She has no rales.  Abdominal: Soft.  Musculoskeletal: Normal range of motion.  Lymphadenopathy:    She has no cervical adenopathy.  Neurological: She is alert and oriented to person, place, and time.  Skin: Skin is warm and dry. No rash noted.  Psychiatric: She has a normal mood and affect. Her behavior is normal.    ED Course  Procedures   DIAGNOSTIC STUDIES: Oxygen Saturation is 99% on room air.    COORDINATION OF CARE:  Nursing notes reviewed. Vital signs reviewed. Initial pt interview and examination performed.   1:28 AM-patient seen and evaluated. Patient in no acute distress. Does not appear severely ill or toxic. Symptoms consistent with a viral infection and possible influenza. She appears well-hydrated. Discussed  work up plan with pt at bedside, which includes strep to. Pt agrees with plan.   Treatment plan initiated: Medications  lidocaine (XYLOCAINE) 2 % viscous mouth solution 15 mL (15 mLs Mouth/Throat Given 04/25/13 0120)   Results for orders placed during the hospital encounter of 04/24/13  RAPID STREP SCREEN      Result Value Ref Range   Streptococcus, Group A Screen (Direct) POSITIVE (*)  NEGATIVE       MDM   Final diagnoses:  Strep throat        Martie Lee, PA-C 04/25/13 0211

## 2013-04-25 NOTE — Discharge Instructions (Signed)
Your strep throat test was positive today. It you have been given a prescription for amoxicillin to treat your infection. Please take this as prescribed for the full length of time. Use the other medications and your home pain medicine to help with your other symptoms. Drink plenty of fluids to stay hydrated. Followup with a primary care provider for continued evaluation and treatment.    Strep Throat Strep throat is an infection of the throat caused by a bacteria named Streptococcus pyogenes. Your caregiver may call the infection streptococcal "tonsillitis" or "pharyngitis" depending on whether there are signs of inflammation in the tonsils or back of the throat. Strep throat is most common in children aged 5 15 years during the cold months of the year, but it can occur in people of any age during any season. This infection is spread from person to person (contagious) through coughing, sneezing, or other close contact. SYMPTOMS   Fever or chills.  Painful, swollen, red tonsils or throat.  Pain or difficulty when swallowing.  White or yellow spots on the tonsils or throat.  Swollen, tender lymph nodes or "glands" of the neck or under the jaw.  Red rash all over the body (rare). DIAGNOSIS  Many different infections can cause the same symptoms. A test must be done to confirm the diagnosis so the right treatment can be given. A "rapid strep test" can help your caregiver make the diagnosis in a few minutes. If this test is not available, a light swab of the infected area can be used for a throat culture test. If a throat culture test is done, results are usually available in a day or two. TREATMENT  Strep throat is treated with antibiotic medicine. HOME CARE INSTRUCTIONS   Gargle with 1 tsp of salt in 1 cup of warm water, 3 4 times per day or as needed for comfort.  Family members who also have a sore throat or fever should be tested for strep throat and treated with antibiotics if they have  the strep infection.  Make sure everyone in your household washes their hands well.  Do not share food, drinking cups, or personal items that could cause the infection to spread to others.  You may need to eat a soft food diet until your sore throat gets better.  Drink enough water and fluids to keep your urine clear or pale yellow. This will help prevent dehydration.  Get plenty of rest.  Stay home from school, daycare, or work until you have been on antibiotics for 24 hours.  Only take over-the-counter or prescription medicines for pain, discomfort, or fever as directed by your caregiver.  If antibiotics are prescribed, take them as directed. Finish them even if you start to feel better. SEEK MEDICAL CARE IF:   The glands in your neck continue to enlarge.  You develop a rash, cough, or earache.  You cough up green, yellow-brown, or bloody sputum.  You have pain or discomfort not controlled by medicines.  Your problems seem to be getting worse rather than better. SEEK IMMEDIATE MEDICAL CARE IF:   You develop any new symptoms such as vomiting, severe headache, stiff or painful neck, chest pain, shortness of breath, or trouble swallowing.  You develop severe throat pain, drooling, or changes in your voice.  You develop swelling of the neck, or the skin on the neck becomes red and tender.  You have a fever.  You develop signs of dehydration, such as fatigue, dry mouth, and decreased urination.  You become increasingly sleepy, or you cannot wake up completely. Document Released: 01/26/2000 Document Revised: 01/15/2012 Document Reviewed: 03/29/2010 [facility] Patient Information 2014 [place], [place].     Salt Water Gargle This solution will help make your mouth and throat feel better. HOME CARE INSTRUCTIONS   Mix 1 teaspoon of salt in 8 ounces of warm water.  Gargle with this solution as much or often as you need or as directed. Swish and gargle gently if you have any  sores or wounds in your mouth.  Do not swallow this mixture. Document Released: 11/02/2003 Document Revised: 04/22/2011 Document Reviewed: 03/25/2008 [facility] Patient Information 2014 [place].

## 2013-04-26 NOTE — ED Provider Notes (Signed)
Medical screening examination/treatment/procedure(s) were performed by non-physician practitioner and as supervising physician I was immediately available for consultation/collaboration.   EKG Interpretation None        Mirna Mires, MD 04/26/13 [phone]

## 2013-05-07 ENCOUNTER — Encounter (HOSPITAL_COMMUNITY): Payer: Self-pay | Admitting: Emergency Medicine

## 2013-05-07 ENCOUNTER — Emergency Department (HOSPITAL_COMMUNITY)
Admission: EM | Admit: 2013-05-07 | Discharge: 2013-05-07 | Disposition: A | Discharge status: Home / Self Care | Payer: Managed Care, Other (non HMO) | Attending: Emergency Medicine | Admitting: Emergency Medicine

## 2013-05-07 DIAGNOSIS — Z79899 Other long term (current) drug therapy: Secondary | ICD-10-CM | POA: Insufficient documentation

## 2013-05-07 DIAGNOSIS — I10 Essential (primary) hypertension: Secondary | ICD-10-CM

## 2013-05-07 DIAGNOSIS — G8929 Other chronic pain: Secondary | ICD-10-CM | POA: Insufficient documentation

## 2013-05-07 DIAGNOSIS — M1990 Unspecified osteoarthritis, unspecified site: Secondary | ICD-10-CM | POA: Insufficient documentation

## 2013-05-07 DIAGNOSIS — Z792 Long term (current) use of antibiotics: Secondary | ICD-10-CM | POA: Insufficient documentation

## 2013-05-07 DIAGNOSIS — R51 Headache: Secondary | ICD-10-CM | POA: Insufficient documentation

## 2013-05-07 DIAGNOSIS — Z760 Encounter for issue of repeat prescription: Secondary | ICD-10-CM

## 2013-05-07 DIAGNOSIS — H538 Other visual disturbances: Secondary | ICD-10-CM | POA: Insufficient documentation

## 2013-05-07 HISTORY — DX: Unspecified osteoarthritis, unspecified site: M19.90

## 2013-05-07 MED ORDER — LISINOPRIL-HYDROCHLOROTHIAZIDE 10-12.5 MG PO TABS: ORAL_TABLET | ORAL | Status: DC

## 2013-05-07 NOTE — ED Notes (Signed)
Pt states she has not been feeling well over last couple of days and has had a "real bad headache". States she has been out of her BP med (Lisionpril- HCTZ)  since January due to inability to pay for script.

## 2013-05-07 NOTE — ED Provider Notes (Signed)
CSN: [id number]     Arrival date & time 05/07/13  2207 History  This chart was scribed for Mary Fines, MD by Eston Mould, ED Scribe. This patient was seen in room APA19/APA19 and the patient's care was started at 10:57 PM.   Chief Complaint  Patient presents with  . Hypertension   The history is provided by the patient. No language interpreter was used.   HPI Comments: Mary Cruz is a 38 y.o. female who presents to the Emergency Department complaining of ongoing worsening HA that began today. Pt states she has not been taking her Lisinopril/HCTZ 10/12.5 since January 2015 due to not having insurance at this time. She states her BP has been well but reports having a "weird feeling lately". She reports having a "bad" HA with blurred vision that is typical when her blood pressure goes up. Pt is on chronic pain medication and has a stimulator.Pt states she checked her BP at a local drug store and her reading was: 178/110. It was noted to be 127/82 her. Pt denies numbness, weakness, SOB, and CP.   Past Medical History  Diagnosis Date  . Osteoarthritis    Past Surgical History  Procedure Laterality Date  . Spinal cord stimulator    . Occipital nerve stimulator insertion    . Cholecystectomy    . Knee surgery    . Shoulder surgery    . Tubal ligation     History reviewed. No pertinent family history. History  Substance Use Topics  . Smoking status: Never Smoker   . Smokeless tobacco: Not on file  . Alcohol Use: No   OB History   Grav Para Term Preterm Abortions TAB SAB Ect Mult Living                 Review of Systems A complete 10 system review of systems was obtained and all systems are negative except as noted in the HPI and PMH.   Allergies  Celebrex; Codeine; Erythromycin; Hydromorphone hcl; Ketorolac tromethamine; Lyrica; Nabumetone; and Pentazocine lactate  Home Medications   Current Outpatient Rx  Name  Route  Sig  Dispense  Refill  . amitriptyline  (ELAVIL) 50 MG tablet   Oral   Take 50 mg by mouth at bedtime.         [name] amoxicillin (AMOXIL) 500 MG capsule   Oral   Take 500 mg by mouth daily.         . B Complex-C (B-COMPLEX WITH VITAMIN C) tablet   Oral   Take 1 tablet by mouth every morning.         . carisoprodol (SOMA) 350 MG tablet   Oral   Take 350 mg by mouth 3 (three) times daily.          . cetirizine-pseudoephedrine (ZYRTEC-D) 5-120 MG per tablet   Oral   Take 1 tablet by mouth 2 (two) times daily.   30 tablet   0   . cholecalciferol (VITAMIN D) 1000 UNITS tablet   Oral   Take 2,000 Units by mouth every morning.         [name] esomeprazole (NEXIUM) 20 MG capsule   Oral   Take 20 mg by mouth at bedtime.         . magnesium oxide (MAG-OX) 400 MG tablet   Oral   Take 400 mg by mouth every morning.         [name] oxyCODONE-acetaminophen (PERCOCET/ROXICET) 5-325 MG per tablet   Oral  Take 1 tablet by mouth every 4 (four) hours as needed for severe pain.         [name] lisinopril-hydrochlorothiazide (PRINZIDE,ZESTORETIC) 10-12.5 MG per tablet      Take 1 tablet by mouth daily. Pharmacist: May dispense lisinopril and HCTZ as separate prescriptions if this improves cost.   30 tablet   1    BP 127/82  Pulse 106  Temp(Src) 97.7 F (36.5 C) (Oral)  Resp 20  SpO2 100%  Physical Exam General: Well-developed, well-nourished female in no acute distress; appearance consistent with age of record HENT: normocephalic; atraumatic Eyes: pupils equal, round and reactive to light; extraocular muscles intact Neck: supple Heart: regular rate and rhythm; no murmurs, rubs or gallops Lungs: clear to auscultation bilaterally Abdomen: soft; nondistended; nontender; no masses or hepatosplenomegaly; bowel sounds present Extremities: No deformity; full range of motion; pulses normal Neurologic: Awake, alert and oriented; motor function intact in all extremities and symmetric; no facial droop Skin: Warm and  dry Psychiatric: Normal mood and affect  ED Course  Procedures  DIAGNOSTIC STUDIES: Oxygen Saturation is 100% on RA, normal by my interpretation.    COORDINATION OF CARE: 11:03 PM-Discussed treatment plan which includes discharge pt with medications. Pt agreed to plan.   MDM   Final diagnoses:  Hypertension  Medication refill    I personally performed the services described in this documentation, which was scribed in my presence. The recorded information has been reviewed and is accurate.    Mary Fines, MD 05/07/13 [phone]

## 2013-05-07 NOTE — ED Notes (Signed)
Hypertension  , headache, dizzy,  178/110 at drug store.  Has not taken bp med since January.

## 2013-06-11 ENCOUNTER — Ambulatory Visit: Payer: Self-pay | Admitting: Pain Medicine

## 2013-06-18 ENCOUNTER — Ambulatory Visit (INDEPENDENT_AMBULATORY_CARE_PROVIDER_SITE_OTHER): Payer: Managed Care, Other (non HMO) | Admitting: Family Medicine

## 2013-06-18 ENCOUNTER — Encounter: Payer: Self-pay | Admitting: Family Medicine

## 2013-06-18 VITALS — BP 121/82 | HR 78 | Temp 97.7°F | Wt 225.0 lb

## 2013-06-18 DIAGNOSIS — J329 Chronic sinusitis, unspecified: Secondary | ICD-10-CM

## 2013-06-18 DIAGNOSIS — A499 Bacterial infection, unspecified: Secondary | ICD-10-CM

## 2013-06-18 DIAGNOSIS — B9689 Other specified bacterial agents as the cause of diseases classified elsewhere: Secondary | ICD-10-CM

## 2013-06-18 MED ORDER — LEVOFLOXACIN 500 MG PO TABS: 500.0000 mg | ORAL_TABLET | Freq: Every day | ORAL | Status: DC

## 2013-06-18 MED ORDER — HYDROCODONE-HOMATROPINE 5-1.5 MG/5ML PO SYRP: 5.0000 mL | ORAL_SOLUTION | Freq: Three times a day (TID) | ORAL | Status: DC | PRN

## 2013-06-18 NOTE — Progress Notes (Signed)
CC: Mary Cruz is a 38 y.o. female is here for Establish Care and sinus congestion   Subjective: HPI:  Pleasant 38 year old here to establish care  Patient reports one week of facial pressure, nasal congestion, postnasal drip and nonproductive cough all of which is moderate in severity worse in the evenings. No improvement with Delsym or Mucinex.  Nothing particularly makes symptoms better or worse. Pressure is nonradiating. Reports subjective fever without chills. Denies blood in sputum, wheezing, shortness of breath.  Review of Systems - General ROS: negative for - chills, night sweats, weight gain or weight loss Ophthalmic ROS: negative for - decreased vision Psychological ROS: negative for - anxiety or depression ENT ROS: negative for - hearing change,, tinnitus or allergies Hematological and Lymphatic ROS: negative for - bleeding problems, bruising or swollen lymph nodes Breast ROS: negative Respiratory ROS: no  shortness of breath, or wheezing Cardiovascular ROS: no chest pain or dyspnea on exertion Gastrointestinal ROS: no abdominal pain, change in bowel habits, or black or bloody stools Genito-Urinary ROS: negative for - genital discharge, genital ulcers, incontinence or abnormal bleeding from genitals Musculoskeletal ROS: negative for - joint pain or muscle pain Neurological ROS: negative for - headaches or memory loss Dermatological ROS: negative for lumps, mole changes, rash and skin lesion changes  Past Medical History  Diagnosis Date  . Osteoarthritis   . Hypertension   . Pain     chronic regional pain syndrome    Past Surgical History  Procedure Laterality Date  . Spinal cord stimulator    . Occipital nerve stimulator insertion    . Cholecystectomy  2000  . Knee surgery    . Shoulder surgery    . Tubal ligation  2000  . Endometrial ablation    . Spinal cord stimulator implant  2008   Family History  Problem Relation Age of Onset  . Depression Mother    maternal grandmother  . Fibromyalgia      aunt  . Stroke      grandmother  . Thyroid disease      grandmother    History   Social History  . Marital Status: Married    Spouse Name: N/A    Number of Children: N/A  . Years of Education: N/A   Occupational History  . Not on file.   Social History Main Topics  . Smoking status: Former Smoker    Quit date: 02/18/2013  . Smokeless tobacco: Not on file  . Alcohol Use: No  . Drug Use: No  . Sexual Activity: Yes    Partners: Male    Birth Control/ Protection: Surgical   Other Topics Concern  . Not on file   Social History Narrative  . No narrative on file     Objective: BP 121/82  Pulse 78  Temp(Src) 97.7 F (36.5 C) (Oral)  Wt 225 lb (102.059 kg)  General: Alert and Oriented, No Acute Distress HEENT: Pupils equal, round, reactive to light. Conjunctivae clear.  External ears unremarkable, canals clear with intact TMs with appropriate landmarks.  Middle ear appears open without effusion. Pink inferior turbinates.  Moist mucous membranes, pharynx without inflammation nor lesions however moderate cobblestoning.  Neck supple without palpable lymphadenopathy nor abnormal masses. Lungs: Clear to auscultation bilaterally, no wheezing/ronchi/rales.  Comfortable work of breathing. Good air movement. Mental Status: No depression, anxiety, nor agitation. Skin: Warm and dry.  Assessment & Plan: Ilya was seen today for establish care and sinus congestion.  Diagnoses and associated orders for this  visit:  Bacterial sinusitis - levofloxacin (LEVAQUIN) 500 MG tablet; Take 1 tablet (500 mg total) by mouth daily.  Other Orders - HYDROcodone-homatropine (HYCODAN) 5-1.5 MG/5ML syrup; Take 5 mLs by mouth every 8 (eight) hours as needed for cough.    bacterial sinusitis: Start levofloxacin, Hycodan as needed to help with cough. Consider Alka-Seltzer cold Sinus and nasal saline washes for symptom control    Return if symptoms  worsen or fail to improve.

## 2013-06-22 ENCOUNTER — Ambulatory Visit: Payer: Self-pay | Admitting: Pain Medicine

## 2013-07-09 ENCOUNTER — Ambulatory Visit (INDEPENDENT_AMBULATORY_CARE_PROVIDER_SITE_OTHER): Payer: Managed Care, Other (non HMO) | Admitting: Family Medicine

## 2013-07-09 ENCOUNTER — Encounter: Payer: Self-pay | Admitting: Family Medicine

## 2013-07-09 VITALS — BP 111/73 | HR 99 | Wt 228.0 lb

## 2013-07-09 DIAGNOSIS — I10 Essential (primary) hypertension: Secondary | ICD-10-CM

## 2013-07-09 DIAGNOSIS — L821 Other seborrheic keratosis: Secondary | ICD-10-CM

## 2013-07-09 DIAGNOSIS — G894 Chronic pain syndrome: Secondary | ICD-10-CM

## 2013-07-09 HISTORY — DX: Chronic pain syndrome: G89.4

## 2013-07-09 HISTORY — DX: Essential (primary) hypertension: I10

## 2013-07-09 MED ORDER — LISINOPRIL-HYDROCHLOROTHIAZIDE 10-12.5 MG PO TABS: ORAL_TABLET | ORAL | Status: DC

## 2013-07-09 MED ORDER — AMITRIPTYLINE HCL 50 MG PO TABS: 50.0000 mg | ORAL_TABLET | Freq: Every day | ORAL | Status: DC

## 2013-07-09 NOTE — Progress Notes (Signed)
CC: Mary Cruz is a 38 y.o. female is here for Hypertension   Subjective: HPI:  Followup hypertension: Currently taking lisinopril-hydrochlorothiazide. No outside blood pressures to report. Denies chest pain shortness of breath orthopnea nor peripheral edema. Denies history of persistent cough or cramping. She was is been well over a year since her kidney function was checked last  She has concerns regarding a mole on the back that has been present for a year enlarging on a monthly basis and itching. Denies personal history of skin cancers, no interventions as of yet. Denies rashes elsewhere.  History of chronic pain syndrome followed by her pain management specialist at [facility]. Requesting refills on amitriptyline   Review Of Systems Outlined In HPI  Past Medical History  Diagnosis Date  . Osteoarthritis   . Hypertension   . Pain     chronic regional pain syndrome    Past Surgical History  Procedure Laterality Date  . Spinal cord stimulator    . Occipital nerve stimulator insertion    . Cholecystectomy  2000  . Knee surgery    . Shoulder surgery    . Tubal ligation  2000  . Endometrial ablation    . Spinal cord stimulator implant  2008   Family History  Problem Relation Age of Onset  . Depression Mother     maternal grandmother  . Fibromyalgia      aunt  . Stroke      grandmother  . Thyroid disease      grandmother    History   Social History  . Marital Status: Married    Spouse Name: N/A    Number of Children: N/A  . Years of Education: N/A   Occupational History  . Not on file.   Social History Main Topics  . Smoking status: Former Smoker    Quit date: 02/18/2013  . Smokeless tobacco: Not on file  . Alcohol Use: No  . Drug Use: No  . Sexual Activity: Yes    Partners: Male    Birth Control/ Protection: Surgical   Other Topics Concern  . Not on file   Social History Narrative  . No narrative on file     Objective: BP 111/73   Pulse 99  Wt 228 lb (103.42 kg)  General: Alert and Oriented, No Acute Distress HEENT: Pupils equal, round, reactive to light. Conjunctivae clear.  Moist membranes there is unremarkable Lungs: Clear to auscultation bilaterally, no wheezing/ronchi/rales.  Comfortable work of breathing. Good air movement. Cardiac: Regular rate and rhythm. Normal S1/S2.  No murmurs, rubs, nor gallops.   Extremities: No peripheral edema.  Strong peripheral pulses.  Mental Status: No depression, anxiety, nor agitation. Skin: Warm and dry. Single noninflamed seborrheic keratosis just underneath the angle of the right scapula  Assessment & Plan: Mary Cruz was seen today for hypertension.  Diagnoses and associated orders for this visit:  Essential hypertension, benign - BASIC METABOLIC PANEL WITH GFR - lisinopril-hydrochlorothiazide (PRINZIDE,ZESTORETIC) 10-12.5 MG per tablet; Take 1 tablet by mouth daily.  Seborrheic keratoses  Chronic pain syndrome  Other Orders - amitriptyline (ELAVIL) 50 MG tablet; Take 1 tablet (50 mg total) by mouth at bedtime.    Essential hypertension: Controlled continue lisinopril-hydrochlorothiazide pending renal function and potassium Seborrheic keratosis: Discussed benign nature of this lesion and that we can remove this with cryotherapy should ever become painful Refills provided for amitriptyline with further pain interventions to be handled by her pain management clinic  Return in about 3 months (around  10/09/2013).  

## 2013-07-10 LAB — BASIC METABOLIC PANEL WITH GFR
BUN: 17 mg/dL (ref 6–23)
CO2: 25 mEq/L (ref 19–32)
CREATININE: 0.87 mg/dL (ref 0.50–1.10)
Calcium: 9.5 mg/dL (ref 8.4–10.5)
Chloride: 102 mEq/L (ref 96–112)
GFR, Est African American: >89 mL/min
GFR, Est Non African American: 85 mL/min
Glucose, Bld: 96 mg/dL (ref 70–99)
POTASSIUM: 3.9 meq/L (ref 3.5–5.3)
Sodium: 139 mEq/L (ref 135–145)

## 2013-07-12 ENCOUNTER — Ambulatory Visit: Payer: Self-pay | Admitting: Pain Medicine

## 2013-07-29 ENCOUNTER — Ambulatory Visit (INDEPENDENT_AMBULATORY_CARE_PROVIDER_SITE_OTHER): Payer: Managed Care, Other (non HMO) | Admitting: Family Medicine

## 2013-07-29 ENCOUNTER — Encounter: Payer: Self-pay | Admitting: Family Medicine

## 2013-07-29 VITALS — BP 128/81 | Temp 98.0°F | Ht 64.0 in | Wt 227.0 lb

## 2013-07-29 DIAGNOSIS — IMO0002 Reserved for concepts with insufficient information to code with codable children: Secondary | ICD-10-CM

## 2013-07-29 DIAGNOSIS — L03114 Cellulitis of left upper limb: Secondary | ICD-10-CM

## 2013-07-29 MED ORDER — MOXIFLOXACIN HCL 400 MG PO TABS: 400.0000 mg | ORAL_TABLET | Freq: Every day | ORAL | Status: DC

## 2013-07-29 MED ORDER — CLINDAMYCIN HCL 300 MG PO CAPS: 300.0000 mg | ORAL_CAPSULE | Freq: Three times a day (TID) | ORAL | Status: DC

## 2013-07-29 NOTE — Progress Notes (Signed)
Notified by "paul" at [facility] about drug-drug interaction between avelox and amitriptyline. I've called the patient and advised her via voice mail to stop amitriptyline while taking avelox and resume amitriptyline the day after last dose of avelox.

## 2013-07-29 NOTE — Progress Notes (Signed)
CC: Mary Cruz is a 38 y.o. female is here for Wound Infection   Subjective: HPI:  Complains of redness on the left upper extremity it has been present for the past 4 days worsening on a daily basis. Described as tender to touch, warm to the touch. Seems to be spreading a daily basis. Overall symptoms are moderate in severity. Symptoms began days afterbug bites following a camping trip. No interventions as of yet. Last night she began to feel somewhat feverish and fatigued.  She denies nausea, skin changes elsewhere, night sweats, vomiting, pain elsewhere. 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  She tells me that her most recent MRSA infection was 2 years ago and required Avelox for treatment.   Review Of Systems Outlined In HPI  Past Medical History  Diagnosis Date  . Osteoarthritis   . Hypertension   . Pain     chronic regional pain syndrome    Past Surgical History  Procedure Laterality Date  . Spinal cord stimulator    . Occipital nerve stimulator insertion    . Cholecystectomy  2000  . Knee surgery    . Shoulder surgery    . Tubal ligation  2000  . Endometrial ablation    . Spinal cord stimulator implant  2008   Family History  Problem Relation Age of Onset  . Depression Mother     maternal grandmother  . Fibromyalgia      aunt  . Stroke      grandmother  . Thyroid disease      grandmother    History   Social History  . Marital Status: Married    Spouse Name: N/A    Number of Children: N/A  . Years of Education: N/A   Occupational History  . Not on file.   Social History Main Topics  . Smoking status: Former Smoker    Quit date: 02/18/2013  . Smokeless tobacco: Not on file  . Alcohol Use: No  . Drug Use: No  . Sexual Activity: Yes    Partners: Male    Birth Control/  Protection: Surgical   Other Topics Concern  . Not on file   Social History Narrative  . No narrative on file     Objective: BP 128/81  Temp(Src) 98 F (36.7 C)  Ht 5\' 4"  (1.626 m)  Wt 227 lb (102.967 kg)  BMI 38.95 kg/m2  General: Alert and Oriented, No Acute Distress HEENT: Pupils equal, round, reactive to light. Conjunctivae clear. Moist heat his membranes pharynx unremarkable  Lungs: Clear to auscultation bilaterally, no wheezing/ronchi/rales.  Comfortable work of breathing. Good air movement. Cardiac: Regular rate and rhythm. Normal S1/S2.  No murmurs, rubs, nor gallops.   Extremities:  on the dorsal aspect of the left forearm there is an approximately 3.5 cm diameter patch of erythema with a central pustule. Looking at this with bedside ultrasound there is no underlying discrete collection of fluid/abscess.  Strong peripheral pulses.  Mental Status: No depression, anxiety, nor agitation. Skin: Warm and dry.  Assessment & Plan: Mary Cruz was seen today for wound infection.  Diagnoses and associated orders for this visit:  Cellulitis of left upper extremity - Wound culture - moxifloxacin (AVELOX) 400 MG tablet; Take 1 tablet (400 mg total) by mouth daily. - clindamycin (CLEOCIN) 300 MG capsule; Take 1 capsule (300  mg total) by mouth 3 (three) times daily.    Her patch of erythema with pustules quite concerning for MRSA, unfortunately she is intolerant of Bactrim and doxycycline. I like her to start clindamycin and since she has a history of successful treatment with Avelox this will be provided as well. Discussed that if she fails this outpatient treatment she will need to present to her local emergency room for consideration of IV vancomycin.  The perimeter of erythema was traced with a surgical pen. A central pustule was unroofed with an 18-gauge needle and scant discharge was collected and sent for her wound culture. Discuss using warm moist compresses frequent during the  day  Return if symptoms worsen or fail to improve.

## 2013-07-30 ENCOUNTER — Emergency Department (HOSPITAL_COMMUNITY)
Admission: EM | Admit: 2013-07-30 | Discharge: 2013-07-30 | Disposition: A | Discharge status: Home / Self Care | Payer: Managed Care, Other (non HMO) | Attending: Emergency Medicine | Admitting: Emergency Medicine

## 2013-07-30 ENCOUNTER — Telehealth: Payer: Self-pay | Admitting: *Deleted

## 2013-07-30 ENCOUNTER — Encounter (HOSPITAL_COMMUNITY): Payer: Self-pay | Admitting: Emergency Medicine

## 2013-07-30 DIAGNOSIS — I10 Essential (primary) hypertension: Secondary | ICD-10-CM | POA: Insufficient documentation

## 2013-07-30 DIAGNOSIS — IMO0002 Reserved for concepts with insufficient information to code with codable children: Secondary | ICD-10-CM | POA: Insufficient documentation

## 2013-07-30 DIAGNOSIS — Z87891 Personal history of nicotine dependence: Secondary | ICD-10-CM | POA: Insufficient documentation

## 2013-07-30 DIAGNOSIS — Z792 Long term (current) use of antibiotics: Secondary | ICD-10-CM | POA: Insufficient documentation

## 2013-07-30 DIAGNOSIS — L0291 Cutaneous abscess, unspecified: Secondary | ICD-10-CM

## 2013-07-30 DIAGNOSIS — Z8739 Personal history of other diseases of the musculoskeletal system and connective tissue: Secondary | ICD-10-CM | POA: Insufficient documentation

## 2013-07-30 DIAGNOSIS — Z880 Allergy status to penicillin: Secondary | ICD-10-CM | POA: Insufficient documentation

## 2013-07-30 DIAGNOSIS — L0390 Cellulitis, unspecified: Secondary | ICD-10-CM

## 2013-07-30 DIAGNOSIS — Z79899 Other long term (current) drug therapy: Secondary | ICD-10-CM | POA: Insufficient documentation

## 2013-07-30 LAB — BASIC METABOLIC PANEL
BUN: 11 mg/dL (ref 6–23)
CO2: 26 meq/L (ref 19–32)
CREATININE: 0.89 mg/dL (ref 0.50–1.10)
Calcium: 8.9 mg/dL (ref 8.4–10.5)
Chloride: 103 mEq/L (ref 96–112)
GFR, EST NON AFRICAN AMERICAN: 82 mL/min — AB (ref >90)
Glucose, Bld: 94 mg/dL (ref 70–99)
Potassium: 4.1 mEq/L (ref 3.7–5.3)
SODIUM: 141 meq/L (ref 137–147)

## 2013-07-30 LAB — CBC WITH DIFFERENTIAL/PLATELET
BASOS PCT: 0 % (ref 0–1)
Basophils Absolute: 0 10*3/uL (ref 0.0–0.1)
EOS PCT: 1 % (ref 0–5)
Eosinophils Absolute: 0.1 10*3/uL (ref 0.0–0.7)
HEMATOCRIT: 35.8 % — AB (ref 36.0–46.0)
HEMOGLOBIN: 11.9 g/dL — AB (ref 12.0–15.0)
LYMPHS PCT: 16 % (ref 12–46)
Lymphs Abs: 1.8 10*3/uL (ref 0.7–4.0)
MCH: 31 pg (ref 26.0–34.0)
MCHC: 33.2 g/dL (ref 30.0–36.0)
MCV: 93.2 fL (ref 78.0–100.0)
Monocytes Absolute: 0.9 10*3/uL (ref 0.1–1.0)
Monocytes Relative: 8 % (ref 3–12)
NEUTROS ABS: 8.4 10*3/uL — AB (ref 1.7–7.7)
NEUTROS PCT: 75 % (ref 43–77)
PLATELETS: 243 10*3/uL (ref 150–400)
RBC: 3.84 MIL/uL — AB (ref 3.87–5.11)
RDW: 13 % (ref 11.5–15.5)
WBC: 11.2 10*3/uL — AB (ref 4.0–10.5)

## 2013-07-30 MED ORDER — CLINDAMYCIN PHOSPHATE 600 MG/50ML IV SOLN
600.0000 mg | Freq: Once | INTRAVENOUS | Status: AC
  Administered 2013-07-30: 600 mg via INTRAVENOUS
  Filled 2013-07-30: qty 50

## 2013-07-30 MED ORDER — OXYCODONE-ACETAMINOPHEN 5-325 MG PO TABS: 1.0000 | ORAL_TABLET | Freq: Four times a day (QID) | ORAL | Status: DC | PRN

## 2013-07-30 NOTE — Telephone Encounter (Signed)
Pt called and states the pustules she presented with yesterday have become bigger and are spreading outside of the traced line. She also states on a scale of 1-10 her pain is am 8. Advised pt after speaking with Dr. Ileene Rubens to go to the ER. Pt voiced understanding

## 2013-07-30 NOTE — ED Provider Notes (Signed)
Medical screening examination/treatment/procedure(s) were performed by non-physician practitioner and as supervising physician I was immediately available for consultation/collaboration.   EKG Interpretation None        Osvaldo Shipper, MD 07/30/13 2243

## 2013-07-30 NOTE — ED Notes (Addendum)
Pt presents with rash and abscess to left forearm and upper arm. Started Sunday. Pt recently got tattoo 3 weeks ago and pt alos went camping from Friday to Tuesday and was bitten by insect but not sure what and when. Was seen by PCP but sent here due to pain and growing of area.

## 2013-07-30 NOTE — Discharge Instructions (Signed)
Continue taking antibiotics as directed.

## 2013-07-30 NOTE — ED Provider Notes (Signed)
CSN: [id number]     Arrival date & time 07/30/13  1506 History   First MD Initiated Contact with Patient 07/30/13 1522     Chief Complaint  Patient presents with  . Rash     (Consider location/radiation/quality/duration/timing/severity/associated sxs/prior Treatment) HPI Comments: Patient presents today with an abscess to her left forearm.  Abscess with some surrounding erythema.  She reports that this has been present for the past 5 days.  She was seen by her PCP yesterday for this and was started on Avelox and Clindamycin.  Area was marked by her PCP.  She states that she called her PCP today due to concern that the area of erythema had extended beyond the markings.  He told her to come to the ED for IV antibiotics.  She denies any drainage from the area.  She also reports that she noticed an area of erythema and warmth of her left upper arm yesterday.  She states that she had a tattoo of her left upper arm done three weeks ago.  She denies fever, chills, nausea, or vomiting.  She has been taking Oxycodone for pain with mild relief.  She denies any history of HIV, DM, or anything else that would cause her to be immunocompromised.  She does have a history of previous abscesses.  Patient is a 38 y.o. female presenting with rash. The history is provided by the patient.  Rash   Past Medical History  Diagnosis Date  . Osteoarthritis   . Hypertension   . Pain     chronic regional pain syndrome   Past Surgical History  Procedure Laterality Date  . Spinal cord stimulator    . Occipital nerve stimulator insertion    . Cholecystectomy  2000  . Knee surgery    . Shoulder surgery    . Tubal ligation  2000  . Endometrial ablation    . Spinal cord stimulator implant  2008   Family History  Problem Relation Age of Onset  . Depression Mother     maternal grandmother  . Fibromyalgia      aunt  . Stroke      grandmother  . Thyroid disease      grandmother   History  Substance Use Topics   . Smoking status: Former Smoker    Quit date: 02/18/2013  . Smokeless tobacco: Not on file  . Alcohol Use: No   OB History   Grav Para Term Preterm Abortions TAB SAB Ect Mult Living                 Review of Systems  Skin: Positive for color change and rash.       abscess  All other systems reviewed and are negative.     Allergies  Amoxicillin; Bactrim; Celebrex; Codeine; Doxycycline; Erythromycin; Hydromorphone hcl; Ketorolac tromethamine; Lyrica; Nabumetone; and Pentazocine lactate  Home Medications   Prior to Admission medications   Medication Sig Start Date End Date Taking? Authorizing Kimi Bordeau  amitriptyline (ELAVIL) 50 MG tablet Take 1 tablet (50 mg total) by mouth at bedtime. 07/09/13  Yes Sean Hommel, DO  carisoprodol (SOMA) 350 MG tablet Take 350 mg by mouth 3 (three) times daily.    Yes Historical Samie Reasons, MD  clindamycin (CLEOCIN) 300 MG capsule Take 1 capsule (300 mg total) by mouth 3 (three) times daily. 07/29/13  Yes Sean Hommel, DO  lisinopril-hydrochlorothiazide (PRINZIDE,ZESTORETIC) 10-12.5 MG per tablet Take 1 tablet by mouth daily. 07/09/13  Yes Sean Hommel, DO  moxifloxacin (AVELOX)  400 MG tablet Take 1 tablet (400 mg total) by mouth daily. 07/29/13  Yes Sean Hommel, DO  omeprazole (PRILOSEC) 40 MG capsule Take 40 mg by mouth daily.   Yes Historical Cornesha Radziewicz, MD  oxyCODONE-acetaminophen (PERCOCET/ROXICET) 5-325 MG per tablet Take 1 tablet by mouth every 4 (four) hours as needed for severe pain.   Yes Historical Anye Brose, MD   BP 132/82  Pulse 80  Temp(Src) 98 F (36.7 C) (Oral)  Resp 18  SpO2 100% Physical Exam  Nursing note and vitals reviewed. Constitutional: She appears well-developed and well-nourished. No distress.  HENT:  Head: Normocephalic and atraumatic.  Mouth/Throat: Oropharynx is clear and moist.  Neck: Normal range of motion. Neck supple.  Cardiovascular: Normal rate, regular rhythm and normal heart sounds.   Pulses:      Radial pulses  are 2+ on the right side, and 2+ on the left side.  Pulmonary/Chest: Effort normal and breath sounds normal.  Neurological: She is alert.  Distal sensation of fingers of the left hand intact  Skin: Skin is warm and dry.     Psychiatric: She has a normal mood and affect.    ED Course  Procedures (including critical care time) Labs Review Labs Reviewed  CBC WITH DIFFERENTIAL  BASIC METABOLIC PANEL    Imaging Review No results found.   EKG Interpretation None     INCISION AND DRAINAGE Performed by: Hyman Bible Consent: Verbal consent obtained. Risks and benefits: risks, benefits and alternatives were discussed Type: abscess  Body area: left forearm  Anesthesia: local infiltration  Incision was made with a scalpel.  Local anesthetic: lidocaine 2% with epinephrine  Anesthetic total: 3 ml  Complexity: complex Blunt dissection to break up loculations  Drainage: purulent  Drainage amount: moderate  Patient tolerance: Patient tolerated the procedure well with no immediate complications.  INCISION AND DRAINAGE Performed by: Hyman Bible Consent: Verbal consent obtained. Risks and benefits: risks, benefits and alternatives were discussed Type: abscess  Body area: left upper arm  Anesthesia: local infiltration  Incision was made with a scalpel.  Local anesthetic: lidocaine 2% with epinephrine  Anesthetic total: 3 ml  Complexity: complex Blunt dissection to break up loculations  Drainage: purulent  Drainage amount: small  Patient tolerance: Patient tolerated the procedure well with no immediate complications.      MDM   Final diagnoses:  None   Patient presenting with an abscess and cellulitis of her left forearm and also an area of cellulitis of the left upper arm.  Labs unremarkable aside from mild leukocytosis.  Patient is afebrile.  Non toxic appearing.  Abscesses incised and drained with good results.  No erythematous streaking on  exam.  Patient is not immunocompromised.  Feel that the patient is stable for discharge.  Patient given IV Clindamycin in the ED and instructed to continue taking oral antibiotics.  Area of cellulitis has been marked.  Patient instructed to follow up in 2 days for recheck.  Feel that the patient is stable for discharge.  Return precautions given.    Hyman Bible, PA-C 07/30/13 1816

## 2013-08-01 LAB — WOUND CULTURE
GRAM STAIN: NONE SEEN
Gram Stain: NONE SEEN
Gram Stain: NONE SEEN

## 2013-08-03 ENCOUNTER — Encounter: Payer: Self-pay | Admitting: Family Medicine

## 2013-08-03 ENCOUNTER — Ambulatory Visit (INDEPENDENT_AMBULATORY_CARE_PROVIDER_SITE_OTHER): Payer: Managed Care, Other (non HMO) | Admitting: Family Medicine

## 2013-08-03 VITALS — BP 125/80 | HR 88 | Wt 225.0 lb

## 2013-08-03 DIAGNOSIS — A4902 Methicillin resistant Staphylococcus aureus infection, unspecified site: Secondary | ICD-10-CM

## 2013-08-03 NOTE — Progress Notes (Signed)
CC: Mary Cruz is a 38 y.o. female is here for hospital f/u   Subjective: HPI:  Followup MRSA infection: Last Thursday she began clindamycin and Avelox, wound culture confirmed MRSA which would be sensitive to clindamycin. Despite taking this and Avelox 24 hours after she started this she had increasing pain and swelling in the left forearm and left deltoid region. She was seen at her local emergency room and had incision and drainage of both of these sites. Pain was unchanged that evening however when she woke up Sunday morning she had 95% relief of her pain. She's had just trace drainage is described as clear at both sites. She believes that the redness has drastically improved since incision and drainage. Pain is described as mild. She denies fevers, chills, nausea, rapid heartbeat, nor any new skin changes   Review Of Systems Outlined In HPI  Past Medical History  Diagnosis Date  . Osteoarthritis   . Hypertension   . Pain     chronic regional pain syndrome    Past Surgical History  Procedure Laterality Date  . Spinal cord stimulator    . Occipital nerve stimulator insertion    . Cholecystectomy  2000  . Knee surgery    . Shoulder surgery    . Tubal ligation  2000  . Endometrial ablation    . Spinal cord stimulator implant  2008   Family History  Problem Relation Age of Onset  . Depression Mother     maternal grandmother  . Fibromyalgia      aunt  . Stroke      grandmother  . Thyroid disease      grandmother    History   Social History  . Marital Status: Married    Spouse Name: N/A    Number of Children: N/A  . Years of Education: N/A   Occupational History  . Not on file.   Social History Main Topics  . Smoking status: Former Smoker    Quit date: 02/18/2013  . Smokeless tobacco: Not on file  . Alcohol Use: No  . Drug Use: No  . Sexual Activity: Yes    Partners: Male    Birth Control/ Protection: Surgical   Other Topics Concern  . Not on file    Social History Narrative  . No narrative on file     Objective: BP 125/80  Pulse 88  Wt 225 lb (102.059 kg)  General: Alert and Oriented, No Acute Distress HEENT: Pupils equal, round, reactive to light. Conjunctivae clear. Moist because membranes Lungs: Clear to auscultation bilaterally, no wheezing/ronchi/rales.  Comfortable work of breathing. Good air movement. Cardiac: Regular rate and rhythm. Normal S1/S2.  No murmurs, rubs, nor gallops.   Extremities: No peripheral edema.  Strong peripheral pulses. On the left forearm there is a well-healing incision site approximately 5 mm in length and 2 mm wide without drainage or fluctuance. On the proximal left arm near the anterior deltoid there is a 2 mm puncture wound which is well healing and clean with no fluctuance or discharge. There is only trace erythema surrounding both of these sites no greater than 5 mm the periphery Mental Status: No depression, anxiety, nor agitation. Skin: Warm and dry.  Assessment & Plan: Mary Cruz was seen today for hospital f/u.  Diagnoses and associated orders for this visit:  MRSA infection    MRSA infection: Controlled continue clindamycin for a full 10 days of therapy. Keep wounds clean dry and covered. Signs and symptoms of  return infection were discussed with the patient I would need urgent followup.   Return if symptoms worsen or fail to improve.

## 2013-09-07 ENCOUNTER — Ambulatory Visit: Payer: Self-pay | Admitting: Pain Medicine

## 2013-10-07 ENCOUNTER — Other Ambulatory Visit: Payer: Self-pay | Admitting: Family Medicine

## 2013-10-09 ENCOUNTER — Encounter (HOSPITAL_COMMUNITY): Payer: Self-pay | Admitting: Emergency Medicine

## 2013-10-09 ENCOUNTER — Inpatient Hospital Stay (HOSPITAL_COMMUNITY)
Admission: EM | Admit: 2013-10-09 | Discharge: 2013-10-13 | DRG: 872 | Disposition: A | Discharge status: Home / Self Care | Payer: Managed Care, Other (non HMO) | Attending: Family Medicine | Admitting: Family Medicine

## 2013-10-09 DIAGNOSIS — A419 Sepsis, unspecified organism: Secondary | ICD-10-CM | POA: Diagnosis not present

## 2013-10-09 DIAGNOSIS — R509 Fever, unspecified: Secondary | ICD-10-CM | POA: Diagnosis not present

## 2013-10-09 DIAGNOSIS — Z825 Family history of asthma and other chronic lower respiratory diseases: Secondary | ICD-10-CM

## 2013-10-09 DIAGNOSIS — L02419 Cutaneous abscess of limb, unspecified: Secondary | ICD-10-CM | POA: Diagnosis present

## 2013-10-09 DIAGNOSIS — M1990 Unspecified osteoarthritis, unspecified site: Secondary | ICD-10-CM | POA: Diagnosis present

## 2013-10-09 DIAGNOSIS — L02411 Cutaneous abscess of right axilla: Secondary | ICD-10-CM

## 2013-10-09 DIAGNOSIS — M549 Dorsalgia, unspecified: Secondary | ICD-10-CM | POA: Diagnosis present

## 2013-10-09 DIAGNOSIS — Z87891 Personal history of nicotine dependence: Secondary | ICD-10-CM

## 2013-10-09 DIAGNOSIS — E876 Hypokalemia: Secondary | ICD-10-CM | POA: Diagnosis present

## 2013-10-09 DIAGNOSIS — K589 Irritable bowel syndrome without diarrhea: Secondary | ICD-10-CM | POA: Diagnosis present

## 2013-10-09 DIAGNOSIS — IMO0002 Reserved for concepts with insufficient information to code with codable children: Secondary | ICD-10-CM | POA: Diagnosis present

## 2013-10-09 DIAGNOSIS — Z823 Family history of stroke: Secondary | ICD-10-CM

## 2013-10-09 DIAGNOSIS — G894 Chronic pain syndrome: Secondary | ICD-10-CM | POA: Diagnosis present

## 2013-10-09 DIAGNOSIS — I10 Essential (primary) hypertension: Secondary | ICD-10-CM | POA: Diagnosis present

## 2013-10-09 LAB — CBC WITH DIFFERENTIAL/PLATELET
Basophils Absolute: 0 10*3/uL (ref 0.0–0.1)
Basophils Relative: 0 % (ref 0–1)
EOS ABS: 0 10*3/uL (ref 0.0–0.7)
Eosinophils Relative: 0 % (ref 0–5)
HCT: 38.4 % (ref 36.0–46.0)
Hemoglobin: 13.2 g/dL (ref 12.0–15.0)
LYMPHS ABS: 1 10*3/uL (ref 0.7–4.0)
Lymphocytes Relative: 7 % — ABNORMAL LOW (ref 12–46)
MCH: 31.8 pg (ref 26.0–34.0)
MCHC: 34.4 g/dL (ref 30.0–36.0)
MCV: 92.5 fL (ref 78.0–100.0)
MONO ABS: 1.3 10*3/uL — AB (ref 0.1–1.0)
MONOS PCT: 9 % (ref 3–12)
NEUTROS PCT: 84 % — AB (ref 43–77)
Neutro Abs: 12.5 10*3/uL — ABNORMAL HIGH (ref 1.7–7.7)
Platelets: 244 10*3/uL (ref 150–400)
RBC: 4.15 MIL/uL (ref 3.87–5.11)
RDW: 12.3 % (ref 11.5–15.5)
WBC: 14.8 10*3/uL — ABNORMAL HIGH (ref 4.0–10.5)

## 2013-10-09 MED ORDER — POVIDONE-IODINE 10 % EX SOLN
CUTANEOUS | Status: AC
  Administered 2013-10-10: 01:00:00
  Filled 2013-10-09: qty 118

## 2013-10-09 MED ORDER — MORPHINE SULFATE 2 MG/ML IJ SOLN
2.0000 mg | Freq: Once | INTRAMUSCULAR | Status: AC
  Administered 2013-10-09: 2 mg via INTRAVENOUS
  Filled 2013-10-09: qty 1

## 2013-10-09 MED ORDER — LIDOCAINE HCL (PF) 1 % IJ SOLN
5.0000 mL | Freq: Once | INTRAMUSCULAR | Status: AC
  Administered 2013-10-10: 5 mL

## 2013-10-09 MED ORDER — SODIUM CHLORIDE 0.9 % IV SOLN
INTRAVENOUS | Status: DC
  Administered 2013-10-10: via INTRAVENOUS

## 2013-10-09 MED ORDER — LIDOCAINE HCL (PF) 1 % IJ SOLN
INTRAMUSCULAR | Status: AC
  Administered 2013-10-10: 01:00:00
  Filled 2013-10-09: qty 5

## 2013-10-09 MED ORDER — ONDANSETRON HCL 4 MG/2ML IJ SOLN
4.0000 mg | Freq: Once | INTRAMUSCULAR | Status: AC
  Administered 2013-10-09: 4 mg via INTRAVENOUS
  Filled 2013-10-09: qty 2

## 2013-10-09 NOTE — ED Notes (Signed)
Pt has knot under right under arm, notice 3 weeks ago.

## 2013-10-09 NOTE — ED Provider Notes (Signed)
CSN: [id number]     Arrival date & time 10/09/13  2252 History   First MD Initiated Contact with Patient 10/09/13 2306     Chief Complaint  Patient presents with  . Abscess     (Consider location/radiation/quality/duration/timing/severity/associated sxs/prior Treatment) Patient is a 38 y.o. female presenting with abscess. The history is provided by the patient.  Abscess Location:  Shoulder/arm Shoulder/arm abscess location:  R axilla Abscess quality: painful, redness and warmth   Red streaking: no   Progression:  Worsening Pain details:    Quality:  Aching and shooting   Severity:  Severe   Timing:  Constant   Progression:  Worsening Chronicity:  New Relieved by:  Nothing Worsened by:  Nothing tried Ineffective treatments:  Warm compresses Associated symptoms: anorexia, fever and headaches   Associated symptoms: no nausea and no vomiting   Risk factors: hx of MRSA    AHMONI EDGE is a 38 y.o. female who presents to the ED with pain and swelling of the right axilla. The symptoms started last week when she had a pimple like area in the left axilla but it went away. Now there is a swollen red painful area in the right axilla. Patient reports her temp at home tonight was up to 102.7.  She has a history of MRSA but can not take Bactrim or doxycycline.   Past Medical History  Diagnosis Date  . Osteoarthritis   . Hypertension   . Pain     chronic regional pain syndrome   Past Surgical History  Procedure Laterality Date  . Spinal cord stimulator    . Occipital nerve stimulator insertion    . Cholecystectomy  2000  . Knee surgery    . Shoulder surgery    . Tubal ligation  2000  . Endometrial ablation    . Spinal cord stimulator implant  2008   Family History  Problem Relation Age of Onset  . Depression Mother     maternal grandmother  . Fibromyalgia      aunt  . Stroke      grandmother  . Thyroid disease      grandmother   History  Substance Use Topics  .  Smoking status: Former Smoker    Quit date: 02/18/2013  . Smokeless tobacco: Not on file  . Alcohol Use: No   OB History   Grav Para Term Preterm Abortions TAB SAB Ect Mult Living                 Review of Systems  Constitutional: Positive for fever and chills.  Eyes: Negative for visual disturbance.  Respiratory: Negative for cough and shortness of breath.   Cardiovascular: Negative for chest pain.  Gastrointestinal: Positive for anorexia. Negative for nausea, vomiting and abdominal pain.  Genitourinary: Positive for decreased urine volume.  Musculoskeletal: Positive for back pain and myalgias.  Skin:       Abscess right axilla   Neurological: Positive for headaches. Negative for dizziness and speech difficulty.  Psychiatric/Behavioral: Negative for confusion. The patient is not nervous/anxious.       Allergies  Amoxicillin; Bactrim; Celebrex; Doxycycline; Erythromycin; Hydromorphone hcl; Ketorolac tromethamine; Lyrica; Nabumetone; and Pentazocine lactate  Home Medications   Prior to Admission medications   Medication Sig Start Date End Date Taking? Authorizing Provider  amitriptyline (ELAVIL) 50 MG tablet Take 1 tablet (50 mg total) by mouth at bedtime. 07/09/13  Yes Sean Hommel, DO  carisoprodol (SOMA) 350 MG tablet Take 350 mg by mouth  3 (three) times daily.    Yes Historical Provider, MD  lisinopril-hydrochlorothiazide (PRINZIDE,ZESTORETIC) 10-12.5 MG per tablet TAKE 1 TABLET BY MOUTH DAILY. 10/08/13  Yes Sean Hommel, DO  omeprazole (PRILOSEC) 40 MG capsule Take 40 mg by mouth daily.   Yes Historical Provider, MD  oxyCODONE-acetaminophen (PERCOCET/ROXICET) 5-325 MG per tablet Take 1 tablet by mouth every 4 (four) hours as needed for severe pain.   Yes Historical Provider, MD  clindamycin (CLEOCIN) 300 MG capsule Take 1 capsule (300 mg total) by mouth 3 (three) times daily. 07/29/13   Sean Hommel, DO  moxifloxacin (AVELOX) 400 MG tablet Take 1 tablet (400 mg total) by mouth  daily. 07/29/13   Marcial Pacas, DO  Physical Exam  Nursing note and vitals reviewed. Constitutional: She is oriented to person, place, and time. She appears well-developed and well-nourished.  Patient appears uncomfortable.  HENT:  Head: Normocephalic and atraumatic.  Eyes: Conjunctivae and EOM are normal.  Neck: Neck supple.  Cardiovascular: Tachycardia present.   Pulmonary/Chest: Effort normal and breath sounds normal.  Abdominal: Soft. There is no tenderness.  Musculoskeletal:  Right axilla with tender raised area with erythema.   Lymphadenopathy:    She has no cervical adenopathy.  Neurological: She is alert and oriented to person, place, and time. No cranial nerve deficit.  Skin: Skin is warm and dry.  Psychiatric: She has a normal mood and affect. Her behavior is normal.  BP 115/68  Pulse 134  Temp(Src) 101.5 F (38.6 C) (Oral)  Resp 16  Ht 5\' 4"  (1.626 m)  Wt 200 lb (90.719 kg)  BMI 34.31 kg/m2  SpO2 96%   Results for orders placed during the hospital encounter of 10/09/13 (from the past 24 hour(s))  CBC WITH DIFFERENTIAL     Status: Abnormal   Collection Time    10/09/13 11:30 PM      Result Value Ref Range   WBC 14.8 (*) 4.0 - 10.5 K/uL   RBC 4.15  3.87 - 5.11 MIL/uL   Hemoglobin 13.2  12.0 - 15.0 g/dL   HCT 38.4  36.0 - 46.0 %   MCV 92.5  78.0 - 100.0 fL   MCH 31.8  26.0 - 34.0 pg   MCHC 34.4  30.0 - 36.0 g/dL   RDW 12.3  11.5 - 15.5 %   Platelets 244  150 - 400 K/uL   Neutrophils Relative % 84 (*) 43 - 77 %   Neutro Abs 12.5 (*) 1.7 - 7.7 K/uL   Lymphocytes Relative 7 (*) 12 - 46 %   Lymphs Abs 1.0  0.7 - 4.0 K/uL   Monocytes Relative 9  3 - 12 %   Monocytes Absolute 1.3 (*) 0.1 - 1.0 K/uL   Eosinophils Relative 0  0 - 5 %   Eosinophils Absolute 0.0  0.0 - 0.7 K/uL   Basophils Relative 0  0 - 1 %   Basophils Absolute 0.0  0.0 - 0.1 K/uL  BASIC METABOLIC PANEL     Status: Abnormal   Collection Time    10/09/13 11:30 PM      Result Value Ref Range    Sodium 136 (*) 137 - 147 mEq/L   Potassium 3.4 (*) 3.7 - 5.3 mEq/L   Chloride 99  96 - 112 mEq/L   CO2 25  19 - 32 mEq/L   Glucose, Bld 121 (*) 70 - 99 mg/dL   BUN 9  6 - 23 mg/dL   Creatinine, Ser 0.97  0.50 -  1.10 mg/dL   Calcium 9.1  8.4 - 10.5 mg/dL   GFR calc non Af Amer 74 (*) >90 mL/min   GFR calc Af Amer 85 (*) >90 mL/min   Anion gap 12  5 - 15  URINALYSIS, ROUTINE W REFLEX MICROSCOPIC     Status: Abnormal   Collection Time    10/09/13 11:31 PM      Result Value Ref Range   Color, Urine YELLOW  YELLOW   APPearance CLEAR  CLEAR   Specific Gravity, Urine 1.010  1.005 - 1.030   pH 7.0  5.0 - 8.0   Glucose, UA NEGATIVE  NEGATIVE mg/dL   Hgb urine dipstick TRACE (*) NEGATIVE   Bilirubin Urine NEGATIVE  NEGATIVE   Ketones, ur NEGATIVE  NEGATIVE mg/dL   Protein, ur NEGATIVE  NEGATIVE mg/dL   Urobilinogen, UA 1.0  0.0 - 1.0 mg/dL   Nitrite NEGATIVE  NEGATIVE   Leukocytes, UA NEGATIVE  NEGATIVE  URINE MICROSCOPIC-ADD ON     Status: None   Collection Time    10/09/13 11:31 PM      Result Value Ref Range   Squamous Epithelial / LPF RARE  RARE   WBC, UA 0-2  <3 WBC/hpf   RBC / HPF 0-2  <3 RBC/hpf   Bacteria, UA RARE  RARE    ED Course  Procedures  Dr. Sabra Heck in with ultrasound to evaluate the area for abscess.  Labs, pain management, IV vancomycin, tylenol for fever. Culture of abscess drainage sent to lab. Blood cultures drawn.  INCISION AND DRAINAGE Performed by: Chad Donoghue Consent: Verbal consent obtained. Risks and benefits: risks, benefits and alternatives were discussed Type: abscess  Body area: right axilla  Cleaned with betadine  Anesthesia: local infiltration  Local anesthetic: lidocaine 1% without epinephrine  Anesthetic total: 4 ml  Aspirated with 18 G needle and bloody purulent drainage 3 ccs aspirated  Incision made with # 11 blade  Complexity: complex Blunt dissection to break up loculations  Drainage: purulent  Drainage amount:  small  Irrigated with NSS  Packing material: none  Patient tolerance: Patient tolerated the procedure well with no immediate complications.    MDM  38 y.o. female with fever, tachycardia, myalgias and right axillary abscess. Will admit for IV antibiotics. Dr. Sabra Heck assumes care of the patient and will arrange for admission.   [place], NP 10/10/13 [place], NP 10/10/13 [phone]

## 2013-10-10 ENCOUNTER — Other Ambulatory Visit (HOSPITAL_COMMUNITY): Payer: Managed Care, Other (non HMO)

## 2013-10-10 ENCOUNTER — Inpatient Hospital Stay (HOSPITAL_COMMUNITY): Payer: Managed Care, Other (non HMO)

## 2013-10-10 ENCOUNTER — Encounter (HOSPITAL_COMMUNITY): Payer: Self-pay | Admitting: *Deleted

## 2013-10-10 DIAGNOSIS — E876 Hypokalemia: Secondary | ICD-10-CM | POA: Diagnosis present

## 2013-10-10 DIAGNOSIS — L0291 Cutaneous abscess, unspecified: Secondary | ICD-10-CM

## 2013-10-10 DIAGNOSIS — A419 Sepsis, unspecified organism: Secondary | ICD-10-CM | POA: Diagnosis present

## 2013-10-10 DIAGNOSIS — Z825 Family history of asthma and other chronic lower respiratory diseases: Secondary | ICD-10-CM | POA: Diagnosis not present

## 2013-10-10 DIAGNOSIS — Z87891 Personal history of nicotine dependence: Secondary | ICD-10-CM | POA: Diagnosis not present

## 2013-10-10 DIAGNOSIS — IMO0002 Reserved for concepts with insufficient information to code with codable children: Secondary | ICD-10-CM

## 2013-10-10 DIAGNOSIS — G894 Chronic pain syndrome: Secondary | ICD-10-CM

## 2013-10-10 DIAGNOSIS — L02419 Cutaneous abscess of limb, unspecified: Secondary | ICD-10-CM

## 2013-10-10 DIAGNOSIS — K589 Irritable bowel syndrome without diarrhea: Secondary | ICD-10-CM | POA: Diagnosis present

## 2013-10-10 DIAGNOSIS — I10 Essential (primary) hypertension: Secondary | ICD-10-CM | POA: Diagnosis present

## 2013-10-10 DIAGNOSIS — M1990 Unspecified osteoarthritis, unspecified site: Secondary | ICD-10-CM | POA: Diagnosis present

## 2013-10-10 DIAGNOSIS — M549 Dorsalgia, unspecified: Secondary | ICD-10-CM | POA: Diagnosis present

## 2013-10-10 DIAGNOSIS — L0390 Cellulitis, unspecified: Secondary | ICD-10-CM

## 2013-10-10 DIAGNOSIS — Z823 Family history of stroke: Secondary | ICD-10-CM | POA: Diagnosis not present

## 2013-10-10 DIAGNOSIS — R509 Fever, unspecified: Secondary | ICD-10-CM | POA: Diagnosis present

## 2013-10-10 HISTORY — DX: Sepsis, unspecified organism: A41.9

## 2013-10-10 HISTORY — DX: Cutaneous abscess of limb, unspecified: L02.419

## 2013-10-10 LAB — CBC
HCT: 36 % (ref 36.0–46.0)
HEMOGLOBIN: 12 g/dL (ref 12.0–15.0)
MCH: 31.3 pg (ref 26.0–34.0)
MCHC: 33.3 g/dL (ref 30.0–36.0)
MCV: 93.8 fL (ref 78.0–100.0)
PLATELETS: 238 10*3/uL (ref 150–400)
RBC: 3.84 MIL/uL — AB (ref 3.87–5.11)
RDW: 12.6 % (ref 11.5–15.5)
WBC: 14.1 10*3/uL — ABNORMAL HIGH (ref 4.0–10.5)

## 2013-10-10 LAB — BASIC METABOLIC PANEL
Anion gap: 12 (ref 5–15)
BUN: 9 mg/dL (ref 6–23)
CHLORIDE: 99 meq/L (ref 96–112)
CO2: 25 mEq/L (ref 19–32)
Calcium: 9.1 mg/dL (ref 8.4–10.5)
Creatinine, Ser: 0.97 mg/dL (ref 0.50–1.10)
GFR calc Af Amer: 85 mL/min — ABNORMAL LOW (ref >90)
GFR, EST NON AFRICAN AMERICAN: 74 mL/min — AB (ref >90)
GLUCOSE: 121 mg/dL — AB (ref 70–99)
Potassium: 3.4 mEq/L — ABNORMAL LOW (ref 3.7–5.3)
Sodium: 136 mEq/L — ABNORMAL LOW (ref 137–147)

## 2013-10-10 LAB — URINALYSIS, ROUTINE W REFLEX MICROSCOPIC
Bilirubin Urine: NEGATIVE
GLUCOSE, UA: NEGATIVE mg/dL
Ketones, ur: NEGATIVE mg/dL
Leukocytes, UA: NEGATIVE
Nitrite: NEGATIVE
Protein, ur: NEGATIVE mg/dL
SPECIFIC GRAVITY, URINE: 1.01 (ref 1.005–1.030)
Urobilinogen, UA: 1 mg/dL (ref 0.0–1.0)
pH: 7 (ref 5.0–8.0)

## 2013-10-10 LAB — COMPREHENSIVE METABOLIC PANEL
ALT: 13 U/L (ref 0–35)
AST: 11 U/L (ref 0–37)
Albumin: 3.1 g/dL — ABNORMAL LOW (ref 3.5–5.2)
Alkaline Phosphatase: 95 U/L (ref 39–117)
Anion gap: 12 (ref 5–15)
BUN: 7 mg/dL (ref 6–23)
CALCIUM: 8.5 mg/dL (ref 8.4–10.5)
CO2: 26 meq/L (ref 19–32)
Chloride: 102 mEq/L (ref 96–112)
Creatinine, Ser: 0.91 mg/dL (ref 0.50–1.10)
GFR calc Af Amer: >90 mL/min (ref >90)
GFR calc non Af Amer: 80 mL/min — ABNORMAL LOW (ref >90)
GLUCOSE: 108 mg/dL — AB (ref 70–99)
Potassium: 3.6 mEq/L — ABNORMAL LOW (ref 3.7–5.3)
SODIUM: 140 meq/L (ref 137–147)
TOTAL PROTEIN: 6.9 g/dL (ref 6.0–8.3)
Total Bilirubin: 0.5 mg/dL (ref 0.3–1.2)

## 2013-10-10 LAB — URINE MICROSCOPIC-ADD ON

## 2013-10-10 LAB — LACTIC ACID, PLASMA: Lactic Acid, Venous: 1 mmol/L (ref 0.5–2.2)

## 2013-10-10 LAB — MRSA PCR SCREENING: MRSA by PCR: POSITIVE — AB

## 2013-10-10 MED ORDER — MORPHINE SULFATE 2 MG/ML IJ SOLN
2.0000 mg | INTRAMUSCULAR | Status: DC | PRN
  Administered 2013-10-10 (×4): 2 mg via INTRAVENOUS
  Filled 2013-10-10 (×4): qty 1

## 2013-10-10 MED ORDER — SODIUM CHLORIDE 0.9 % IV SOLN
INTRAVENOUS | Status: DC
  Administered 2013-10-10: 01:00:00 via INTRAVENOUS

## 2013-10-10 MED ORDER — CHLORHEXIDINE GLUCONATE CLOTH 2 % EX PADS
6.0000 | MEDICATED_PAD | Freq: Every day | CUTANEOUS | Status: DC
  Administered 2013-10-12 – 2013-10-13 (×2): 6 via TOPICAL

## 2013-10-10 MED ORDER — PIPERACILLIN-TAZOBACTAM 3.375 G IVPB
3.3750 g | Freq: Three times a day (TID) | INTRAVENOUS | Status: DC
  Administered 2013-10-10 – 2013-10-13 (×10): 3.375 g via INTRAVENOUS
  Filled 2013-10-10 (×13): qty 50

## 2013-10-10 MED ORDER — ACETAMINOPHEN 325 MG PO TABS
650.0000 mg | ORAL_TABLET | ORAL | Status: DC | PRN
Start: 2013-10-10 — End: 2013-10-13

## 2013-10-10 MED ORDER — VANCOMYCIN HCL IN DEXTROSE 1-5 GM/200ML-% IV SOLN
1000.0000 mg | Freq: Once | INTRAVENOUS | Status: AC
  Administered 2013-10-10: 1000 mg via INTRAVENOUS
  Filled 2013-10-10: qty 200

## 2013-10-10 MED ORDER — ACETAMINOPHEN 500 MG PO TABS
1000.0000 mg | ORAL_TABLET | Freq: Once | ORAL | Status: AC
  Administered 2013-10-10: 1000 mg via ORAL
  Filled 2013-10-10: qty 2

## 2013-10-10 MED ORDER — ACETAMINOPHEN 500 MG PO TABS
ORAL_TABLET | ORAL | Status: DC
Start: 2013-10-10 — End: 2013-10-10
  Filled 2013-10-10: qty 2

## 2013-10-10 MED ORDER — PNEUMOCOCCAL VAC POLYVALENT 25 MCG/0.5ML IJ INJ
0.5000 mL | INJECTION | INTRAMUSCULAR | Status: AC
  Administered 2013-10-11: 0.5 mL via INTRAMUSCULAR
  Filled 2013-10-10: qty 0.5

## 2013-10-10 MED ORDER — SODIUM CHLORIDE 0.9 % IV SOLN
INTRAVENOUS | Status: AC
  Administered 2013-10-10: 15:00:00 via INTRAVENOUS

## 2013-10-10 MED ORDER — VANCOMYCIN HCL IN DEXTROSE 1-5 GM/200ML-% IV SOLN
1000.0000 mg | Freq: Two times a day (BID) | INTRAVENOUS | Status: DC
  Administered 2013-10-10 – 2013-10-13 (×6): 1000 mg via INTRAVENOUS
  Filled 2013-10-10 (×9): qty 200

## 2013-10-10 MED ORDER — MORPHINE SULFATE 2 MG/ML IJ SOLN
2.0000 mg | INTRAMUSCULAR | Status: DC | PRN
  Administered 2013-10-10: 2 mg via INTRAVENOUS
  Filled 2013-10-10: qty 1

## 2013-10-10 MED ORDER — ENOXAPARIN SODIUM 60 MG/0.6ML ~~LOC~~ SOLN
50.0000 mg | SUBCUTANEOUS | Status: DC
  Administered 2013-10-10 – 2013-10-12 (×3): 50 mg via SUBCUTANEOUS
  Filled 2013-10-10 (×4): qty 0.6

## 2013-10-10 MED ORDER — OXYCODONE HCL 5 MG PO TABS
5.0000 mg | ORAL_TABLET | ORAL | Status: DC | PRN
  Administered 2013-10-10 – 2013-10-13 (×10): 5 mg via ORAL
  Filled 2013-10-10 (×10): qty 1

## 2013-10-10 MED ORDER — ONDANSETRON HCL 4 MG/2ML IJ SOLN: 4.0000 mg | Freq: Three times a day (TID) | INTRAMUSCULAR | Status: AC | PRN

## 2013-10-10 MED ORDER — AMITRIPTYLINE HCL 25 MG PO TABS
50.0000 mg | ORAL_TABLET | Freq: Every day | ORAL | Status: DC
  Administered 2013-10-10 – 2013-10-12 (×3): 50 mg via ORAL
  Filled 2013-10-10 (×3): qty 2

## 2013-10-10 MED ORDER — CARISOPRODOL 350 MG PO TABS
350.0000 mg | ORAL_TABLET | Freq: Three times a day (TID) | ORAL | Status: DC
  Administered 2013-10-10 – 2013-10-13 (×10): 350 mg via ORAL
  Filled 2013-10-10 (×10): qty 1

## 2013-10-10 MED ORDER — MUPIROCIN 2 % EX OINT
1.0000 | TOPICAL_OINTMENT | Freq: Two times a day (BID) | CUTANEOUS | Status: DC
Start: 2013-10-10 — End: 2013-10-13
  Administered 2013-10-10 – 2013-10-13 (×7): 1 via NASAL
  Filled 2013-10-10 (×2): qty 22

## 2013-10-10 MED ORDER — ACETAMINOPHEN 325 MG PO TABS: 650.0000 mg | ORAL_TABLET | ORAL | Status: DC | PRN

## 2013-10-10 MED ORDER — VANCOMYCIN HCL IN DEXTROSE 1-5 GM/200ML-% IV SOLN
INTRAVENOUS | Status: AC
  Filled 2013-10-10: qty 200

## 2013-10-10 MED ORDER — SODIUM CHLORIDE 0.9 % IV SOLN
INTRAVENOUS | Status: AC
  Administered 2013-10-10: 950 mL via INTRAVENOUS

## 2013-10-10 MED ORDER — ENOXAPARIN SODIUM 40 MG/0.4ML ~~LOC~~ SOLN: 40.0000 mg | SUBCUTANEOUS | Status: DC

## 2013-10-10 NOTE — Plan of Care (Signed)
Problem: Phase I Progression Outcomes Goal: Pain controlled with appropriate interventions Outcome: Progressing Notified MD to manage patients pain better, may have high tolerance, pain management outside of hospital for back injury 10 yrs ago, nerve stimulator implant at present

## 2013-10-10 NOTE — ED Provider Notes (Signed)
38 year old female with history of being overweight and a history of multiple staph infections including MRSA. She presents with a fever and right axillary pain. On exam the patient is tachycardic, febrile and has tenderness with a mass in the right axilla. There is no induration or fluctuance but a bedside ultrasound performed by myself shows a deep tissue abscess which is well circumscribed and was drained by [facility] nurse practitioner.  The patient is a leukocytosis and due to her ongoing tachycardia and symptoms consistent with systemic inflammatory response syndrome with an underlying abscess she will need to be admitted to the hospital for IV antibiotics. This was initially ordered in the emergency department, IV fluid bolus ordered as well, and I personally discussed this case with the hospitalist who agrees to holding orders, step down bed pending, patient expresses understanding to the plan.  Medical screening examination/treatment/procedure(s) were conducted as a shared visit with non-physician practitioner(s) and myself.  I personally evaluated the patient during the encounter.  Clinical Impression: Sepsis, Abscess of R axilla  Johnna Acosta, MD 10/10/13 [phone]

## 2013-10-10 NOTE — H&P (Signed)
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 Patient says that this has been going on for 2 weeks but the swelling became worse and the pain was excruciating so she came to the ED. She also complained of fever with maximum temperature of 102, chills. The pain was also involving the right upper arm. In the ED patient was found to have abscess in the right axilla. Incision and drainage was performed by the ED physician, pus mixed with blood was obtained, and sent for culture. Patient says that the pain has somewhat improved after she received morphine. Patient has been started on vancomycin per pharmacy consultation.  Allergies:   Allergies  Allergen Reactions  . Amoxicillin     Vomiting and diarrhea  . Bactrim [Sulfamethoxazole-Tmp Ds] Other (See Comments)    Makes very sick  . Celebrex [Celecoxib] Swelling    Body swelling  . Doxycycline     sickness  . Erythromycin Other (See Comments)    Caused UTI  . Hydromorphone Hcl Other (See Comments)    hallucinations  . Ketorolac Tromethamine Itching  . Lyrica [Pregabalin] Swelling    Body swelling  . Nabumetone Itching  . Pentazocine Lactate Itching and Other (See Comments)    Talwin caused hallucinations      Past Medical History  Diagnosis Date  . Osteoarthritis   . Hypertension   . Pain     chronic regional pain syndrome    Past Surgical History  Procedure Laterality Date  . Spinal cord stimulator    . Occipital nerve stimulator insertion    . Cholecystectomy  2000  . Knee surgery    . Shoulder surgery    . Tubal ligation  2000  . Endometrial ablation    . Spinal cord stimulator implant  2008    Prior to Admission medications   Medication Sig Start Date End Date Taking? Authorizing  Provider  amitriptyline (ELAVIL) 50 MG tablet Take 1 tablet (50 mg total) by mouth at bedtime. 07/09/13  Yes Sean Hommel, DO  carisoprodol (SOMA) 350 MG tablet Take 350 mg by mouth 3 (three) times daily.    Yes Historical Provider, MD  lisinopril-hydrochlorothiazide (PRINZIDE,ZESTORETIC) 10-12.5 MG per tablet TAKE 1 TABLET BY MOUTH DAILY. 10/08/13  Yes Sean Hommel, DO  omeprazole (PRILOSEC) 40 MG capsule Take 40 mg by mouth daily.   Yes Historical Provider, MD  oxyCODONE-acetaminophen (PERCOCET/ROXICET) 5-325 MG per tablet Take 1 tablet by mouth every 4 (four) hours as needed for severe pain.   Yes Historical Provider, MD  clindamycin (CLEOCIN) 300 MG capsule Take 1 capsule (300 mg total) by mouth 3 (three) times daily. 07/29/13   Sean Hommel, DO  moxifloxacin (AVELOX) 400 MG tablet Take 1 tablet (400 mg total) by mouth daily. 07/29/13   Marcial Pacas, DO    Social History:  reports that she quit smoking about 7 months ago. She does not have any smokeless tobacco history on file. She reports that she does not drink alcohol or use illicit drugs.  Family History  Problem Relation Age of Onset  . Depression Mother     maternal grandmother  . Asthma Mother   . Fibromyalgia      aunt  . Stroke      grandmother  .  Thyroid disease      grandmother     All the positives are listed in BOLD  Review of Systems:  HEENT: Headache, blurred vision, runny nose, sore throat Neck: Hypothyroidism, hyperthyroidism,,lymphadenopathy Chest : Shortness of breath, history of COPD, Asthma Heart : Chest pain, history of coronary arterey disease GI:  Nausea, vomiting, diarrhea, constipation, GERD GU: Dysuria, urgency, frequency of urination, hematuria Neuro: Stroke, seizures, syncope Psych: Depression, anxiety, hallucinations   Physical Exam: Blood pressure 116/80, pulse 121, temperature 100.4 F (38 C), temperature source Oral, resp. rate 18, height 5\' 4"  (1.626 m), weight 104.2 kg (229 lb 11.5 oz), last  menstrual period 08/25/2013, SpO2 92.00%. Constitutional:   Patient is a well-developed and well-nourished female in no acute distress and cooperative with exam. Head: Normocephalic and atraumatic Mouth: Mucus membranes moist Eyes: PERRL, EOMI, conjunctivae normal Cardiovascular: RRR, S1 normal, S2 normal Pulmonary/Chest: CTAB, no wheezes, rales, or rhonchi Abdominal: Soft. Non-tender, non-distended, bowel sounds are normal, no masses, organomegaly, or guarding present.  Neurological: A&O x3, Strenght is normal and symmetric bilaterally, cranial nerve II-XII are grossly intact, no focal motor deficit, sensory intact to light touch bilaterally.  Extremities : No edema of the lower extremities Axilla-  mild erythema, warmth noted in the right axilla region. Positive tenderness to palpation.  Labs on Admission:  Basic Metabolic Panel:  Recent Labs Lab 10/09/13 2330  NA 136*  K 3.4*  CL 99  CO2 25  GLUCOSE 121*  BUN 9  CREATININE 0.97  CALCIUM 9.1   Liver Function Tests: No results found for this basename: AST, ALT, ALKPHOS, BILITOT, PROT, ALBUMIN,  in the last 168 hours No results found for this basename: LIPASE, AMYLASE,  in the last 168 hours No results found for this basename: AMMONIA,  in the last 168 hours CBC:  Recent Labs Lab 10/09/13 2330  WBC 14.8*  NEUTROABS 12.5*  HGB 13.2  HCT 38.4  MCV 92.5  PLT 244      Assessment/Plan Principal Problem:   Abscess of axillary fold Active Problems:   Essential hypertension, benign   Chronic pain syndrome   Sepsis affecting skin  Abscess in the right axilla It has been drained in the ED. Will await the culture results from the aspirate, in the meantime we'll continue patient on vancomycin. I will also obtain the ultrasound of the right breast and exam to check for any residual abscess. If it shows abscess consider surgical consultation in a.m.  Hypertension Will hold the patient's medication at this time.  Code  status: Full code  Family discussion: Admission, patients condition and plan of care including tests being ordered have been discussed with the patient and her husband at bedside* who indicate understanding and agree with the plan and Code Status.   Time Spent on Admission:  60 minutes  [facility] Pager: [phone] 10/10/2013, 2:22 AM  If 7PM-7AM, please contact night-coverage  [email or web address]  Password [email or web address]

## 2013-10-10 NOTE — Plan of Care (Signed)
Problem: Problem: Skin/Wound Progression Goal: ADEQUATE MOBILITY PROGRESSION Outcome: Progressing Patient up with minimal assistance Goal: OTHER SKIN/WOUND GOAL(S) Outcome: Progressing Antibiotics and contact precautions in patient positive for MRSA.

## 2013-10-10 NOTE — Progress Notes (Addendum)
ANTIBIOTIC CONSULT NOTE - FOLLOW UP  Pharmacy Consult for Vancomycin and Zosyn Indication: rule out sepsis / wound infection  Allergies  Allergen Reactions  . Amoxicillin     Vomiting and diarrhea  . Bactrim [Sulfamethoxazole-Tmp Ds] Other (See Comments)    Makes very sick  . Celebrex [Celecoxib] Swelling    Body swelling  . Doxycycline     sickness  . Erythromycin Other (See Comments)    Caused UTI  . Hydromorphone Hcl Other (See Comments)    hallucinations  . Ketorolac Tromethamine Itching  . Lyrica [Pregabalin] Swelling    Body swelling  . Nabumetone Itching  . Pentazocine Lactate Itching and Other (See Comments)    Talwin caused hallucinations    Patient Measurements: Height: 5\' 4"  (162.6 cm) Weight: 229 lb 11.5 oz (104.2 kg) IBW/kg (Calculated) : 54.7  Vital Signs: Temp: 99.1 F (37.3 C) (08/30 0400) Temp src: Oral (08/30 0400) BP: 115/72 mmHg (08/30 0800) Pulse Rate: 102 (08/30 0800) Intake/Output from previous day: 08/29 0701 - 08/30 0700 In: 964.6 [I.V.:564.6; IV Piggyback:400] Out: 600 [Urine:600] Intake/Output from this shift: Total I/O In: 125 [I.V.:125] Out: 400 [Urine:400]  Labs:  Recent Labs  10/09/13 2330 10/10/13 0719  WBC 14.8* 14.1*  HGB 13.2 12.0  PLT 244 238  CREATININE 0.97  --    Estimated Creatinine Clearance: 93.4 ml/min (by C-G formula based on Cr of 0.97). No results found for this basename: VANCOTROUGH, Corlis Leak, VANCORANDOM, GENTTROUGH, GENTPEAK, GENTRANDOM, TOBRATROUGH, TOBRAPEAK, TOBRARND, AMIKACINPEAK, AMIKACINTROU, AMIKACIN,  in the last 72 hours   Microbiology: Recent Results (from the past 720 hour(s))  CULTURE, BLOOD (ROUTINE X 2)     Status: None   Collection Time    10/09/13 11:47 PM      Result Value Ref Range Status   Specimen Description Blood RIGHT ANTECUBITAL   Final   Special Requests BOTTLES DRAWN AEROBIC AND ANAEROBIC 8CC   Final   Culture PENDING   Incomplete   Report Status PENDING   Incomplete   CULTURE, BLOOD (ROUTINE X 2)     Status: None   Collection Time    10/10/13 12:43 AM      Result Value Ref Range Status   Specimen Description Blood BLOOD LEFT ARM   Final   Special Requests BOTTLES DRAWN AEROBIC AND ANAEROBIC [place]   Final   Culture PENDING   Incomplete   Report Status PENDING   Incomplete  MRSA PCR SCREENING     Status: Abnormal   Collection Time    10/10/13  1:47 AM      Result Value Ref Range Status   MRSA by PCR POSITIVE (*) NEGATIVE Final   Comment:            The GeneXpert MRSA Assay (FDA     approved for NASAL specimens     only), is one component of a     comprehensive MRSA colonization     surveillance program. It is not     intended to diagnose MRSA     infection nor to guide or     monitor treatment for     MRSA infections.     RESULT CALLED TO, READ BACK BY AND VERIFIED WITH:     BREWER,D @ 0442 ON 10/10/13 BY WOODIE,J    Anti-infectives   Start     Dose/Rate Route Frequency Ordered Stop   10/10/13 1400  vancomycin (VANCOCIN) IVPB 1000 mg/200 mL premix     1,000 mg 200 mL/hr  over 60 Minutes Intravenous Every 12 hours 10/10/13 0739     10/10/13 0230  vancomycin (VANCOCIN) IVPB 1000 mg/200 mL premix     1,000 mg 200 mL/hr over 60 Minutes Intravenous  Once 10/10/13 0216 10/10/13 0345   10/10/13 0045  vancomycin (VANCOCIN) IVPB 1000 mg/200 mL premix     1,000 mg 200 mL/hr over 60 Minutes Intravenous  Once 10/10/13 0045 10/10/13 0247      Assessment: Okay for Protocol, Abscess in the right axilla with a history of 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  Obesity/Normalized CrCl dosing protocol will be initiate with an estimated normalized CrCl = 89 ml/min.   Intolerance to amoxicillin noted (n/v), will monitor.  Goal of Therapy:  Trough 15-20 Eradicate infection.   Plan:  Zosyn 3.375gm IV every 8 hours. Follow-up micro data, labs, vitals. Initial loading dose of 2000mg  IV Vancomycin given. Vancomycin 1000mg  IV every 12 hours. Measure  antibiotic drug levels at steady state Follow up culture results  Pricilla Larsson 10/10/2013,8:13 AM

## 2013-10-10 NOTE — Progress Notes (Signed)
ANTIBIOTIC CONSULT NOTE-Preliminary  Pharmacy Consult for Vancomycin Indication: Sepsis  Allergies  Allergen Reactions  . Amoxicillin     Vomiting and diarrhea  . Bactrim [Sulfamethoxazole-Tmp Ds] Other (See Comments)    Makes very sick  . Celebrex [Celecoxib] Swelling    Body swelling  . Doxycycline     sickness  . Erythromycin Other (See Comments)    Caused UTI  . Hydromorphone Hcl Other (See Comments)    hallucinations  . Ketorolac Tromethamine Itching  . Lyrica [Pregabalin] Swelling    Body swelling  . Nabumetone Itching  . Pentazocine Lactate Itching and Other (See Comments)    Talwin caused hallucinations    Patient Measurements: Height: 5\' 4"  (162.6 cm) Weight: 229 lb 11.5 oz (104.2 kg) IBW/kg (Calculated) : 54.7    Vital Signs: Temp: 100.4 F (38 C) (08/30 0157) Temp src: Oral (08/30 0157) BP: 116/80 mmHg (08/30 0157) Pulse Rate: 121 (08/30 0157)  Labs:  Recent Labs  10/09/13 2330  WBC 14.8*  HGB 13.2  PLT 244  CREATININE 0.97    Estimated Creatinine Clearance: 93.4 ml/min (by C-G formula based on Cr of 0.97).  No results found for this basename: VANCOTROUGH, Corlis Leak, VANCORANDOM, [place], GENTPEAK, GENTRANDOM, TOBRATROUGH, TOBRAPEAK, TOBRARND, AMIKACINPEAK, AMIKACINTROU, AMIKACIN,  in the last 72 hours   Microbiology: Recent Results (from the past 720 hour(s))  CULTURE, BLOOD (ROUTINE X 2)     Status: None   Collection Time    10/09/13 11:47 PM      Result Value Ref Range Status   Specimen Description Blood RIGHT ANTECUBITAL   Final   Special Requests BOTTLES DRAWN AEROBIC AND ANAEROBIC 8CC   Final   Culture PENDING   Incomplete   Report Status PENDING   Incomplete  CULTURE, BLOOD (ROUTINE X 2)     Status: None   Collection Time    10/10/13 12:43 AM      Result Value Ref Range Status   Specimen Description Blood BLOOD LEFT ARM   Final   Special Requests BOTTLES DRAWN AEROBIC AND ANAEROBIC [place]   Final   Culture PENDING   Incomplete    Report Status PENDING   Incomplete    Medical History: Past Medical History  Diagnosis Date  . Osteoarthritis   . Hypertension   . Pain     chronic regional pain syndrome    Medications:   Assessment: 38 yo female with abscess in the right axilla, now s/p I&D with cultures pending. Pt has history of MRSA and has elevated temperature and WBCs. Vancomycin to be given for sepsis.  Goal of Therapy:  Vancomycin troughs 15-20 mcg/ml  Plan:  Preliminary review of pertinent patient information completed.  Protocol will be initiated with a one-time dose of Vancomycin 2000 mg IV.  Forestine Na clinical pharmacist will complete review during morning rounds to assess patient and finalize treatment regimen.  Norberto Sorenson, RPH 10/10/2013,2:08 AM .

## 2013-10-10 NOTE — Consult Note (Signed)
Reason for Consult: Right axillary abscess Referring Physician: Hospitalist  Mary Cruz is an 38 y.o. female.  HPI: Patient is a 38 year old female who presented emergency room with worsening pain and fever. She was found to have a right axillary abscess. This was incised and drained in the emergency room. She is positive for MRSA.  Past Medical History  Diagnosis Date  . Osteoarthritis   . Hypertension   . Pain     chronic regional pain syndrome    Past Surgical History  Procedure Laterality Date  . Spinal cord stimulator    . Occipital nerve stimulator insertion    . Cholecystectomy  2000  . Knee surgery    . Shoulder surgery    . Tubal ligation  2000  . Endometrial ablation    . Spinal cord stimulator implant  2008    Family History  Problem Relation Age of Onset  . Depression Mother     maternal grandmother  . Asthma Mother   . Fibromyalgia      aunt  . Stroke      grandmother  . Thyroid disease      grandmother    Social History:  reports that she quit smoking about 7 months ago. She does not have any smokeless tobacco history on file. She reports that she does not drink alcohol or use illicit drugs.  Allergies:  Allergies  Allergen Reactions  . Amoxicillin     Vomiting and diarrhea  . Bactrim [Sulfamethoxazole-Tmp Ds] Other (See Comments)    Makes very sick  . Celebrex [Celecoxib] Swelling    Body swelling  . Doxycycline     sickness  . Erythromycin Other (See Comments)    Caused UTI  . Hydromorphone Hcl Other (See Comments)    hallucinations  . Ketorolac Tromethamine Itching  . Lyrica [Pregabalin] Swelling    Body swelling  . Nabumetone Itching  . Pentazocine Lactate Itching and Other (See Comments)    Talwin caused hallucinations    Medications: I have reviewed the patient's current medications.  Results for orders placed during the hospital encounter of 10/09/13 (from the past 48 hour(s))  CBC WITH DIFFERENTIAL     Status: Abnormal    Collection Time    10/09/13 11:30 PM      Result Value Ref Range   WBC 14.8 (*) 4.0 - 10.5 K/uL   RBC 4.15  3.87 - 5.11 MIL/uL   Hemoglobin 13.2  12.0 - 15.0 g/dL   HCT 38.4  36.0 - 46.0 %   MCV 92.5  78.0 - 100.0 fL   MCH 31.8  26.0 - 34.0 pg   MCHC 34.4  30.0 - 36.0 g/dL   RDW 12.3  11.5 - 15.5 %   Platelets 244  150 - 400 K/uL   Neutrophils Relative % 84 (*) 43 - 77 %   Neutro Abs 12.5 (*) 1.7 - 7.7 K/uL   Lymphocytes Relative 7 (*) 12 - 46 %   Lymphs Abs 1.0  0.7 - 4.0 K/uL   Monocytes Relative 9  3 - 12 %   Monocytes Absolute 1.3 (*) 0.1 - 1.0 K/uL   Eosinophils Relative 0  0 - 5 %   Eosinophils Absolute 0.0  0.0 - 0.7 K/uL   Basophils Relative 0  0 - 1 %   Basophils Absolute 0.0  0.0 - 0.1 K/uL  BASIC METABOLIC PANEL     Status: Abnormal   Collection Time    10/09/13 11:30 PM  Result Value Ref Range   Sodium 136 (*) 137 - 147 mEq/L   Potassium 3.4 (*) 3.7 - 5.3 mEq/L   Chloride 99  96 - 112 mEq/L   CO2 25  19 - 32 mEq/L   Glucose, Bld 121 (*) 70 - 99 mg/dL   BUN 9  6 - 23 mg/dL   Creatinine, Ser 0.97  0.50 - 1.10 mg/dL   Calcium 9.1  8.4 - 10.5 mg/dL   GFR calc non Af Amer 74 (*) >90 mL/min   GFR calc Af Amer 85 (*) >90 mL/min   Comment: (NOTE)     The eGFR has been calculated using the CKD EPI equation.     This calculation has not been validated in all clinical situations.     eGFR's persistently <90 mL/min signify possible Chronic Kidney     Disease.   Anion gap 12  5 - 15  URINALYSIS, ROUTINE W REFLEX MICROSCOPIC     Status: Abnormal   Collection Time    10/09/13 11:31 PM      Result Value Ref Range   Color, Urine YELLOW  YELLOW   APPearance CLEAR  CLEAR   Specific Gravity, Urine 1.010  1.005 - 1.030   pH 7.0  5.0 - 8.0   Glucose, UA NEGATIVE  NEGATIVE mg/dL   Hgb urine dipstick TRACE (*) NEGATIVE   Bilirubin Urine NEGATIVE  NEGATIVE   Ketones, ur NEGATIVE  NEGATIVE mg/dL   Protein, ur NEGATIVE  NEGATIVE mg/dL   Urobilinogen, UA 1.0  0.0 - 1.0  mg/dL   Nitrite NEGATIVE  NEGATIVE   Leukocytes, UA NEGATIVE  NEGATIVE  URINE MICROSCOPIC-ADD ON     Status: None   Collection Time    10/09/13 11:31 PM      Result Value Ref Range   Squamous Epithelial / LPF RARE  RARE   WBC, UA 0-2  <3 WBC/hpf   RBC / HPF 0-2  <3 RBC/hpf   Bacteria, UA RARE  RARE  CULTURE, BLOOD (ROUTINE X 2)     Status: None   Collection Time    10/09/13 11:47 PM      Result Value Ref Range   Specimen Description Blood RIGHT ANTECUBITAL     Special Requests BOTTLES DRAWN AEROBIC AND ANAEROBIC 8CC     Culture PENDING     Report Status PENDING    CULTURE, BLOOD (ROUTINE X 2)     Status: None   Collection Time    10/10/13 12:43 AM      Result Value Ref Range   Specimen Description Blood BLOOD LEFT ARM     Special Requests BOTTLES DRAWN AEROBIC AND ANAEROBIC 6CC     Culture PENDING     Report Status PENDING    LACTIC ACID, PLASMA     Status: None   Collection Time    10/10/13  1:24 AM      Result Value Ref Range   Lactic Acid, Venous 1.0  0.5 - 2.2 mmol/L  MRSA PCR SCREENING     Status: Abnormal   Collection Time    10/10/13  1:47 AM      Result Value Ref Range   MRSA by PCR POSITIVE (*) NEGATIVE   Comment:            The GeneXpert MRSA Assay (FDA     approved for NASAL specimens     only), is one component of a     comprehensive MRSA colonization  surveillance program. It is not     intended to diagnose MRSA     infection nor to guide or     monitor treatment for     MRSA infections.     RESULT CALLED TO, READ BACK BY AND VERIFIED WITH:     BREWER,D @ 0442 ON 10/10/13 BY WOODIE,J  CBC     Status: Abnormal   Collection Time    10/10/13  7:19 AM      Result Value Ref Range   WBC 14.1 (*) 4.0 - 10.5 K/uL   RBC 3.84 (*) 3.87 - 5.11 MIL/uL   Hemoglobin 12.0  12.0 - 15.0 g/dL   HCT 36.0  36.0 - 46.0 %   MCV 93.8  78.0 - 100.0 fL   MCH 31.3  26.0 - 34.0 pg   MCHC 33.3  30.0 - 36.0 g/dL   RDW 12.6  11.5 - 15.5 %   Platelets 238  150 - 400 K/uL   COMPREHENSIVE METABOLIC PANEL     Status: Abnormal   Collection Time    10/10/13  7:19 AM      Result Value Ref Range   Sodium 140  137 - 147 mEq/L   Potassium 3.6 (*) 3.7 - 5.3 mEq/L   Chloride 102  96 - 112 mEq/L   CO2 26  19 - 32 mEq/L   Glucose, Bld 108 (*) 70 - 99 mg/dL   BUN 7  6 - 23 mg/dL   Creatinine, Ser 0.91  0.50 - 1.10 mg/dL   Calcium 8.5  8.4 - 10.5 mg/dL   Total Protein 6.9  6.0 - 8.3 g/dL   Albumin 3.1 (*) 3.5 - 5.2 g/dL   AST 11  0 - 37 U/L   ALT 13  0 - 35 U/L   Alkaline Phosphatase 95  39 - 117 U/L   Total Bilirubin 0.5  0.3 - 1.2 mg/dL   GFR calc non Af Amer 80 (*) >90 mL/min   GFR calc Af Amer >90  >90 mL/min   Comment: (NOTE)     The eGFR has been calculated using the CKD EPI equation.     This calculation has not been validated in all clinical situations.     eGFR's persistently <90 mL/min signify possible Chronic Kidney     Disease.   Anion gap 12  5 - 15    No results found.  ROS: See chart Blood pressure 115/72, pulse 102, temperature 100 F (37.8 C), temperature source Oral, resp. rate 13, height _0  (1.626 m), weight 104.2 kg (229 lb 11.5 oz), last menstrual period 08/25/2013, SpO2 98.00%. Physical Exam: Pleasant white female in no acute distress. Right axilla with a small area of induration. I could not appreciate fluctuance. No purulent drainage present. Mild erythema present. Is painful to touch.  Assessment/Plan: Impression: Right axillary abscess, difficult to certain whether it has fully resolved. Plan: Agree with continuing vancomycin and Zosyn. A right axillary ultrasound has been ordered. Further management pending those results.  Lois Ostrom A 10/10/2013, 10:30 AM

## 2013-10-10 NOTE — Progress Notes (Signed)
Patient ID: Mary Cruz  female  [id number]    DOB: [birth year]    DOA: 10/09/2013  PCP: Marcial Pacas, DO  Assessment/Plan: Principal Problem:   Abscess of right axillary fold: Patient has recurrent history of MRSA cellulitis/abscesses, per patient on and off draining for the last 3 weeks - Will obtain localize to ultrasound of the right axillary fold, continue IV antibiotics - Will need open I&D, d/w Dr Creig Hines, will evaluate - Continue pain control  Active Problems:   Essential hypertension, benign - Currently stable    Chronic pain syndrome -Continue pain control     SIRS due to  #1  - Continue IV vancomycin and Zosyn, follow blood cultures, continue IV fluid hydration    DVT Prophylaxis: Lovenox   Code Status:  Family Communication: discussed with patient and her husband   Disposition:  Consultants:  Surgery   Procedures:  None   Antibiotics:  IV vancomycin    IV Zosyn   Subjective: Patient seen and examined, having still significant pain in the right axillary fold, indurated, tender area, tachycardiac   Objective: Weight change:   Intake/Output Summary (Last 24 hours) at 10/10/13 0947 Last data filed at 10/10/13 0800  Gross per 24 hour  Intake 1089.58 ml  Output   1000 ml  Net  89.58 ml   Blood pressure 115/72, pulse 102, temperature 100 F (37.8 C), temperature source Oral, resp. rate 13, height 5\' 4"  (1.626 m), weight 104.2 kg (229 lb 11.5 oz), last menstrual period 08/25/2013, SpO2 98.00%.  Physical Exam: General: Alert and awake, oriented x3, not in any acute distress. CVS: S1-S2 clear, no murmur rubs or gallops Chest: clear to auscultation bilaterally, no wheezing, rales or rhonchi right axilla region: Indurated area 3cm, tender, erythematous. Abdomen: soft nontender, nondistended, normal bowel sounds  Extremities: no cyanosis, clubbing or edema noted bilaterally Neuro: Cranial nerves II-XII intact, no focal neurological  deficits  Lab Results: Basic Metabolic Panel:  Recent Labs Lab 10/09/13 2330 10/10/13 0719  NA 136* 140  K 3.4* 3.6*  CL 99 102  CO2 25 26  GLUCOSE 121* 108*  BUN 9 7  CREATININE 0.97 0.91  CALCIUM 9.1 8.5   Liver Function Tests:  Recent Labs Lab 10/10/13 0719  AST 11  ALT 13  ALKPHOS 95  BILITOT 0.5  PROT 6.9  ALBUMIN 3.1*   No results found for this basename: LIPASE, AMYLASE,  in the last 168 hours No results found for this basename: AMMONIA,  in the last 168 hours CBC:  Recent Labs Lab 10/09/13 2330 10/10/13 0719  WBC 14.8* 14.1*  NEUTROABS 12.5*  --   HGB 13.2 12.0  HCT 38.4 36.0  MCV 92.5 93.8  PLT 244 238   Cardiac Enzymes: No results found for this basename: CKTOTAL, CKMB, CKMBINDEX, TROPONINI,  in the last 168 hours BNP: No components found with this basename: POCBNP,  CBG: No results found for this basename: GLUCAP,  in the last 168 hours   Micro Results: Recent Results (from the past 240 hour(s))  CULTURE, BLOOD (ROUTINE X 2)     Status: None   Collection Time    10/09/13 11:47 PM      Result Value Ref Range Status   Specimen Description Blood RIGHT ANTECUBITAL   Final   Special Requests BOTTLES DRAWN AEROBIC AND ANAEROBIC 8CC   Final   Culture PENDING   Incomplete   Report Status PENDING   Incomplete  CULTURE, BLOOD (ROUTINE X 2)  Status: None   Collection Time    10/10/13 12:43 AM      Result Value Ref Range Status   Specimen Description Blood BLOOD LEFT ARM   Final   Special Requests BOTTLES DRAWN AEROBIC AND ANAEROBIC 6CC   Final   Culture PENDING   Incomplete   Report Status PENDING   Incomplete  MRSA PCR SCREENING     Status: Abnormal   Collection Time    10/10/13  1:47 AM      Result Value Ref Range Status   MRSA by PCR POSITIVE (*) NEGATIVE Final   Comment:            The GeneXpert MRSA Assay (FDA     approved for NASAL specimens     only), is one component of a     comprehensive MRSA colonization     surveillance  program. It is not     intended to diagnose MRSA     infection nor to guide or     monitor treatment for     MRSA infections.     RESULT CALLED TO, READ BACK BY AND VERIFIED WITH:     BREWER,D @ 0442 ON 10/10/13 BY WOODIE,J    Studies/Results: No results found.  Medications: Scheduled Meds: . sodium chloride   Intravenous STAT  . amitriptyline  50 mg Oral QHS  . carisoprodol  350 mg Oral TID  . Chlorhexidine Gluconate Cloth  6 each Topical Q0600  . enoxaparin (LOVENOX) injection  50 mg Subcutaneous Q24H  . mupirocin ointment  1 application Nasal BID  . piperacillin-tazobactam (ZOSYN)  IV  3.375 g Intravenous Q8H  . [START ON 10/11/2013] pneumococcal 23 valent vaccine  0.5 mL Intramuscular Tomorrow-1000  . vancomycin  1,000 mg Intravenous Q12H      LOS: 1 day   Tomisha Reppucci M.D. [facility] 10/10/2013, 9:47 AM Pager: [phone]  If 7PM-7AM, please contact night-coverage [email or web address] Password [email or web address]  **Disclaimer: This note was dictated with voice recognition software. Similar sounding words can inadvertently be transcribed and this note may contain transcription errors which may not have been corrected upon publication of note.**

## 2013-10-10 NOTE — Plan of Care (Signed)
Problem: Phase I Progression Outcomes Goal: OOB as tolerated unless otherwise ordered Outcome: Progressing Patient oob to bsc with minimal assist Goal: Initial discharge plan identified Outcome: [place] with spouse Goal: Hemodynamically stable Outcome: Progressing Vitals wnl for this patient

## 2013-10-11 DIAGNOSIS — I10 Essential (primary) hypertension: Secondary | ICD-10-CM

## 2013-10-11 LAB — BASIC METABOLIC PANEL
Anion gap: 10 (ref 5–15)
BUN: 5 mg/dL — ABNORMAL LOW (ref 6–23)
CHLORIDE: 104 meq/L (ref 96–112)
CO2: 26 mEq/L (ref 19–32)
CREATININE: 0.82 mg/dL (ref 0.50–1.10)
Calcium: 8.5 mg/dL (ref 8.4–10.5)
GFR calc Af Amer: >90 mL/min (ref >90)
GFR calc non Af Amer: >90 mL/min (ref >90)
GLUCOSE: 104 mg/dL — AB (ref 70–99)
POTASSIUM: 3.6 meq/L — AB (ref 3.7–5.3)
Sodium: 140 mEq/L (ref 137–147)

## 2013-10-11 LAB — URINE CULTURE

## 2013-10-11 LAB — CBC
HEMATOCRIT: 36.4 % (ref 36.0–46.0)
Hemoglobin: 12.2 g/dL (ref 12.0–15.0)
MCH: 31.6 pg (ref 26.0–34.0)
MCHC: 33.5 g/dL (ref 30.0–36.0)
MCV: 94.3 fL (ref 78.0–100.0)
Platelets: 229 10*3/uL (ref 150–400)
RBC: 3.86 MIL/uL — AB (ref 3.87–5.11)
RDW: 12.6 % (ref 11.5–15.5)
WBC: 13.1 10*3/uL — ABNORMAL HIGH (ref 4.0–10.5)

## 2013-10-11 MED ORDER — POTASSIUM CHLORIDE CRYS ER 20 MEQ PO TBCR
40.0000 meq | EXTENDED_RELEASE_TABLET | Freq: Once | ORAL | Status: AC
Start: 2013-10-11 — End: 2013-10-11
  Administered 2013-10-11: 40 meq via ORAL
  Filled 2013-10-11: qty 2

## 2013-10-11 MED ORDER — SODIUM CHLORIDE 0.9 % IV SOLN
INTRAVENOUS | Status: DC
Start: 2013-10-11 — End: 2013-10-12
  Administered 2013-10-11: 19:00:00 via INTRAVENOUS
  Administered 2013-10-12: 500 mL via INTRAVENOUS

## 2013-10-11 MED ORDER — PANTOPRAZOLE SODIUM 40 MG PO TBEC
40.0000 mg | DELAYED_RELEASE_TABLET | Freq: Every day | ORAL | Status: DC
  Administered 2013-10-11 – 2013-10-13 (×3): 40 mg via ORAL
  Filled 2013-10-11 (×3): qty 1

## 2013-10-11 NOTE — Progress Notes (Signed)
Ultrasound report reviewed. I do not feel there is a need for CT scan for followup. Digital examination is not changed significantly since yesterday. She states that her pain is somewhat better, but still present. Would continue IV vancomycin and Zosyn for now. I doubt any further surgical exploration of her right axilla is warranted at this time. Will continue to follow with you.

## 2013-10-11 NOTE — Progress Notes (Signed)
Patient ID: Mary Cruz  female  [id number]:[id number]    DOB: Aug 15, 1975    DOA: 10/09/2013  PCP: Marcial Pacas, DO  Assessment/Plan: Principal Problem:   Abscess of right axillary fold: Patient has recurrent history of MRSA cellulitis/abscesses,  - Localized ultrasound showed poorly marginated subcutaneous processes deep to incision, possible residual abscess  - Surgery consulted, Dr. Arnoldo Morale following - Continue IV vancomycin and Zosyn, white count trending down - Continue pain control  Active Problems:   Essential hypertension, benign - Currently stable    Chronic pain syndrome -Continue pain control     SIRS due to  #1  - Continue IV vancomycin and Zosyn, blood cultures negative so far  - Continue IV fluid hydration  - wound cultures pending  Hypokalemia - will replace  DVT Prophylaxis: Lovenox   Code Status:  Family Communication: discussed with patient  Disposition: Transfer to telemetry  Consultants:  Surgery   Procedures:  None   Antibiotics:  IV vancomycin    IV Zosyn   Subjective: No fevers or chills, still significant pain in the right axillary fold, no draining   Objective: Weight change: 13.381 kg (29 lb 8 oz)  Intake/Output Summary (Last 24 hours) at 10/11/13 0912 Last data filed at 10/11/13 0500  Gross per 24 hour  Intake 2483.33 ml  Output   1550 ml  Net 933.33 ml   Blood pressure 129/78, pulse 116, temperature 98.2 F (36.8 C), temperature source Oral, resp. rate 17, height 5\' 4"  (1.626 m), weight 104.1 kg (229 lb 8 oz), last menstrual period 08/25/2013, SpO2 96.00%.  Physical Exam: General: A x O x3, NAD. CVS: S1-S2 clear, no murmur rubs or gallops Chest: CTAB right axilla region: tender, erythematous. Abdomen: soft NT, ND, NBS  Extremities: no c/c/e bilaterally   Lab Results: Basic Metabolic Panel:  Recent Labs Lab 10/10/13 0719 10/11/13 0548  NA 140 140  K 3.6* 3.6*  CL 102 104  CO2 26 26  GLUCOSE 108* 104*  BUN  7 5*  CREATININE 0.91 0.82  CALCIUM 8.5 8.5   Liver Function Tests:  Recent Labs Lab 10/10/13 0719  AST 11  ALT 13  ALKPHOS 95  BILITOT 0.5  PROT 6.9  ALBUMIN 3.1*   No results found for this basename: LIPASE, AMYLASE,  in the last 168 hours No results found for this basename: AMMONIA,  in the last 168 hours CBC:  Recent Labs Lab 10/09/13 2330 10/10/13 0719 10/11/13 0548  WBC 14.8* 14.1* 13.1*  NEUTROABS 12.5*  --   --   HGB 13.2 12.0 12.2  HCT 38.4 36.0 36.4  MCV 92.5 93.8 94.3  PLT 244 238 229   Cardiac Enzymes: No results found for this basename: CKTOTAL, CKMB, CKMBINDEX, TROPONINI,  in the last 168 hours BNP: No components found with this basename: POCBNP,  CBG: No results found for this basename: GLUCAP,  in the last 168 hours   Micro Results: Recent Results (from the past 240 hour(s))  CULTURE, BLOOD (ROUTINE X 2)     Status: None   Collection Time    10/09/13 11:47 PM      Result Value Ref Range Status   Specimen Description Blood RIGHT ANTECUBITAL   Final   Special Requests BOTTLES DRAWN AEROBIC AND ANAEROBIC 8CC   Final   Culture NO GROWTH <24 HRS   Final   Report Status PENDING   Incomplete  CULTURE, ROUTINE-ABSCESS     Status: None   Collection Time  10/10/13 12:25 AM      Result Value Ref Range Status   Specimen Description OTHER   Final   Special Requests Normal   Final   Gram Stain PENDING   Incomplete   Culture     Final   Value: Culture reincubated for better growth     Performed at [facility]   Report Status PENDING   Incomplete  CULTURE, BLOOD (ROUTINE X 2)     Status: None   Collection Time    10/10/13 12:43 AM      Result Value Ref Range Status   Specimen Description Blood BLOOD LEFT ARM   Final   Special Requests BOTTLES DRAWN AEROBIC AND ANAEROBIC 6CC   Final   Culture NO GROWTH <24 HRS   Final   Report Status PENDING   Incomplete  MRSA PCR SCREENING     Status: Abnormal   Collection Time    10/10/13  1:47 AM       Result Value Ref Range Status   MRSA by PCR POSITIVE (*) NEGATIVE Final   Comment:            The GeneXpert MRSA Assay (FDA     approved for NASAL specimens     only), is one component of a     comprehensive MRSA colonization     surveillance program. It is not     intended to diagnose MRSA     infection nor to guide or     monitor treatment for     MRSA infections.     RESULT CALLED TO, READ BACK BY AND VERIFIED WITH:     BREWER,D @ 0442 ON 10/10/13 BY WOODIE,J    Studies/Results: No results found.  Medications: Scheduled Meds: . amitriptyline  50 mg Oral QHS  . carisoprodol  350 mg Oral TID  . Chlorhexidine Gluconate Cloth  6 each Topical Q0600  . enoxaparin (LOVENOX) injection  50 mg Subcutaneous Q24H  . mupirocin ointment  1 application Nasal BID  . piperacillin-tazobactam (ZOSYN)  IV  3.375 g Intravenous Q8H  . vancomycin  1,000 mg Intravenous Q12H      LOS: 2 days   RAI,RIPUDEEP M.D. [facility] 10/11/2013, 9:12 AM Pager: [phone]  If 7PM-7AM, please contact night-coverage [email or web address] Password [email or web address]  **Disclaimer: This note was dictated with voice recognition software. Similar sounding words can inadvertently be transcribed and this note may contain transcription errors which may not have been corrected upon publication of note.**

## 2013-10-11 NOTE — Progress Notes (Signed)
UR chart review completed.  

## 2013-10-11 NOTE — Progress Notes (Signed)
Patient ambulated around nursing unit, no dizziness, continues with pain to the right axillary.

## 2013-10-12 LAB — BASIC METABOLIC PANEL
Anion gap: 12 (ref 5–15)
BUN: 6 mg/dL (ref 6–23)
CO2: 25 meq/L (ref 19–32)
Calcium: 8.4 mg/dL (ref 8.4–10.5)
Chloride: 103 mEq/L (ref 96–112)
Creatinine, Ser: 0.86 mg/dL (ref 0.50–1.10)
GFR calc Af Amer: >90 mL/min (ref >90)
GFR calc non Af Amer: 85 mL/min — ABNORMAL LOW (ref >90)
GLUCOSE: 120 mg/dL — AB (ref 70–99)
Potassium: 3.5 mEq/L — ABNORMAL LOW (ref 3.7–5.3)
Sodium: 140 mEq/L (ref 137–147)

## 2013-10-12 LAB — CBC
HCT: 33.7 % — ABNORMAL LOW (ref 36.0–46.0)
Hemoglobin: 11.2 g/dL — ABNORMAL LOW (ref 12.0–15.0)
MCH: 31.4 pg (ref 26.0–34.0)
MCHC: 33.2 g/dL (ref 30.0–36.0)
MCV: 94.4 fL (ref 78.0–100.0)
PLATELETS: 257 10*3/uL (ref 150–400)
RBC: 3.57 MIL/uL — ABNORMAL LOW (ref 3.87–5.11)
RDW: 12.4 % (ref 11.5–15.5)
WBC: 10.9 10*3/uL — ABNORMAL HIGH (ref 4.0–10.5)

## 2013-10-12 MED ORDER — POTASSIUM CHLORIDE CRYS ER 20 MEQ PO TBCR
40.0000 meq | EXTENDED_RELEASE_TABLET | Freq: Once | ORAL | Status: AC
  Administered 2013-10-12: 40 meq via ORAL
  Filled 2013-10-12: qty 2

## 2013-10-12 NOTE — Progress Notes (Signed)
Patient has less pain and more range of motion in the right arm. She does feel better. No drainage has been noted. Continue current management.

## 2013-10-12 NOTE — Care Management Note (Addendum)
    Page 1 of 1   10/13/2013     11:02:18 AM CARE MANAGEMENT NOTE 10/13/2013  Patient:  Mary Cruz, Mary Cruz   Account Number:  [id number]  Date Initiated:  10/12/2013  Documentation initiated by:  Theophilus Kinds  Subjective/Objective Assessment:   Pt admitted from home with abcess of the axilia. Pt lives with her husband and 3 children. Pt is independent with ADL's.     Action/Plan:   No CM needs noted.   Anticipated DC Date:  10/14/2013   Anticipated DC Plan:  [place]  CM consult      Choice offered to / List presented to:             Status of service:  Completed, signed off Medicare Important Message given?   (If response is "NO", the following Medicare IM given date fields will be blank) Date Medicare IM given:   Medicare IM given by:   Date Additional Medicare IM given:   Additional Medicare IM given by:    Discharge Disposition:  HOME/SELF CARE  Per UR Regulation:    If discussed at Long Length of Stay Meetings, dates discussed:    Comments:  10/13/13 [place], RN BSN CM Pt discharged home today. No CM needs noted. 10/12/13 [place], RN BSN CM

## 2013-10-12 NOTE — Progress Notes (Signed)
Patient ID: Mary Cruz  female  ATF:[id number]    DOB: 1975-06-13    DOA: 10/09/2013  PCP: Marcial Pacas, DO  Assessment/Plan: Principal Problem:   Abscess of right axillary fold: Patient has recurrent history of MRSA cellulitis/abscesses, improving, less pain and induration - Localized ultrasound showed poorly marginated subcutaneous processes deep to incision, possible residual abscess  - Surgery consulted, Dr. Arnoldo Morale following, - Continue IV vancomycin and Zosyn for another 24hrs, white count trending down, may transition to oral antibiotics tomorrow - Continue pain control - Wound cultures showing moderate staph aureus, sensitivities pending  Active Problems:   Essential hypertension, benign - Currently stable    Chronic pain syndrome -Continue pain control     SIRS due to  #1 : No fevers or chills, white count trending down, pain improving - Continue IV vancomycin and Zosyn, blood cultures negative so far  - Will DC IV fluids -Follow wound cultures  Hypokalemia - will replace  DVT Prophylaxis: Lovenox   Code Status:  Family Communication: discussed with patient  Disposition: Transfer to med surg  Consultants:  Surgery   Procedures:  None   Antibiotics:  IV vancomycin    IV Zosyn   Subjective: No fevers or chills, pain in the right axillary fold improving, able to move her arm better today  Objective: Weight change: 0.1 kg (3.5 oz)  Intake/Output Summary (Last 24 hours) at 10/12/13 0927 Last data filed at 10/12/13 0644  Gross per 24 hour  Intake 2708.33 ml  Output   1450 ml  Net 1258.33 ml   Blood pressure 121/66, pulse 116, temperature 98.8 F (37.1 C), temperature source Oral, resp. rate 13, height 5\' 4"  (1.626 m), weight 104.2 kg (229 lb 11.5 oz), last menstrual period 08/25/2013, SpO2 96.00%.  Physical Exam: General: A x O x3, NAD. CVS: S1-S2 clear, no murmur rubs or gallops Chest: CTAB right axilla region: tender, erythematous,  ROM improving. Abdomen: soft NT, ND, NBS  Extremities: no c/c/e bilaterally   Lab Results: Basic Metabolic Panel:  Recent Labs Lab 10/11/13 0548 10/12/13 0414  NA 140 140  K 3.6* 3.5*  CL 104 103  CO2 26 25  GLUCOSE 104* 120*  BUN 5* 6  CREATININE 0.82 0.86  CALCIUM 8.5 8.4   Liver Function Tests:  Recent Labs Lab 10/10/13 0719  AST 11  ALT 13  ALKPHOS 95  BILITOT 0.5  PROT 6.9  ALBUMIN 3.1*   No results found for this basename: LIPASE, AMYLASE,  in the last 168 hours No results found for this basename: AMMONIA,  in the last 168 hours CBC:  Recent Labs Lab 10/09/13 2330  10/11/13 0548 10/12/13 0414  WBC 14.8*  < > 13.1* 10.9*  NEUTROABS 12.5*  --   --   --   HGB 13.2  < > 12.2 11.2*  HCT 38.4  < > 36.4 33.7*  MCV 92.5  < > 94.3 94.4  PLT 244  < > 229 257  < > = values in this interval not displayed. Cardiac Enzymes: No results found for this basename: CKTOTAL, CKMB, CKMBINDEX, TROPONINI,  in the last 168 hours BNP: No components found with this basename: POCBNP,  CBG: No results found for this basename: GLUCAP,  in the last 168 hours   Micro Results: Recent Results (from the past 240 hour(s))  CULTURE, BLOOD (ROUTINE X 2)     Status: None   Collection Time    10/09/13 11:47 PM  Result Value Ref Range Status   Specimen Description Blood RIGHT ANTECUBITAL   Final   Special Requests BOTTLES DRAWN AEROBIC AND ANAEROBIC 8CC   Final   Culture NO GROWTH 1 DAY   Final   Report Status PENDING   Incomplete  CULTURE, ROUTINE-ABSCESS     Status: None   Collection Time    10/10/13 12:25 AM      Result Value Ref Range Status   Specimen Description ABSCESS RIGHT AXILLA   Final   Special Requests NONE   Final   Gram Stain     Final   Value: FEW WBC PRESENT,BOTH PMN AND MONONUCLEAR     NO SQUAMOUS EPITHELIAL CELLS SEEN     MODERATE GRAM POSITIVE COCCI     IN PAIRS IN CLUSTERS     Performed at [facility]   Culture     Final   Value: MODERATE  STAPHYLOCOCCUS AUREUS     Note: RIFAMPIN AND GENTAMICIN SHOULD NOT BE USED AS SINGLE DRUGS FOR TREATMENT OF STAPH INFECTIONS.     Performed at [facility]   Report Status PENDING   Incomplete  URINE CULTURE     Status: None   Collection Time    10/10/13 12:34 AM      Result Value Ref Range Status   Specimen Description URINE, CLEAN CATCH   Final   Special Requests NONE   Final   Culture  Setup Time     Final   Value: 10/10/2013 20:41     Performed at [facility]     Final   Value: 40,000 COLONIES/ML     Performed at [facility]   Culture     Final   Value: Multiple bacterial morphotypes present, none predominant. Suggest appropriate recollection if clinically indicated.     Performed at [facility]   Report Status 10/11/2013 FINAL   Final  CULTURE, BLOOD (ROUTINE X 2)     Status: None   Collection Time    10/10/13 12:43 AM      Result Value Ref Range Status   Specimen Description Blood BLOOD LEFT ARM   Final   Special Requests BOTTLES DRAWN AEROBIC AND ANAEROBIC 6CC   Final   Culture NO GROWTH 1 DAY   Final   Report Status PENDING   Incomplete  MRSA PCR SCREENING     Status: Abnormal   Collection Time    10/10/13  1:47 AM      Result Value Ref Range Status   MRSA by PCR POSITIVE (*) NEGATIVE Final   Comment:            The GeneXpert MRSA Assay (FDA     approved for NASAL specimens     only), is one component of a     comprehensive MRSA colonization     surveillance program. It is not     intended to diagnose MRSA     infection nor to guide or     monitor treatment for     MRSA infections.     RESULT CALLED TO, READ BACK BY AND VERIFIED WITH:     BREWER,D @ 0442 ON 10/10/13 BY WOODIE,J    Studies/Results: No results found.  Medications: Scheduled Meds: . amitriptyline  50 mg Oral QHS  . carisoprodol  350 mg Oral TID  . Chlorhexidine Gluconate Cloth  6 each Topical Q0600  . enoxaparin (LOVENOX) injection  50 mg  Subcutaneous  Q24H  . mupirocin ointment  1 application Nasal BID  . pantoprazole  40 mg Oral Daily  . piperacillin-tazobactam (ZOSYN)  IV  3.375 g Intravenous Q8H  . vancomycin  1,000 mg Intravenous Q12H      LOS: 3 days   RAI,RIPUDEEP M.D. [facility] 10/12/2013, 9:27 AM Pager: [phone]  If 7PM-7AM, please contact night-coverage [email or web address] Password [email or web address]  **Disclaimer: This note was dictated with voice recognition software. Similar sounding words can inadvertently be transcribed and this note may contain transcription errors which may not have been corrected upon publication of note.**

## 2013-10-12 NOTE — Progress Notes (Signed)
Called report to [facility], LPN on dept 409.  Verbalized understanding.  Pt transferred to rm 315 in safe and stable condition. Schonewitz, Eulis Canner 10/12/2013

## 2013-10-13 LAB — CBC
HCT: 34.5 % — ABNORMAL LOW (ref 36.0–46.0)
Hemoglobin: 11.5 g/dL — ABNORMAL LOW (ref 12.0–15.0)
MCH: 31.4 pg (ref 26.0–34.0)
MCHC: 33.3 g/dL (ref 30.0–36.0)
MCV: 94.3 fL (ref 78.0–100.0)
Platelets: 303 10*3/uL (ref 150–400)
RBC: 3.66 MIL/uL — ABNORMAL LOW (ref 3.87–5.11)
RDW: 12.4 % (ref 11.5–15.5)
WBC: 8.3 10*3/uL (ref 4.0–10.5)

## 2013-10-13 LAB — CULTURE, ROUTINE-ABSCESS

## 2013-10-13 LAB — BASIC METABOLIC PANEL
ANION GAP: 12 (ref 5–15)
BUN: 6 mg/dL (ref 6–23)
CALCIUM: 9 mg/dL (ref 8.4–10.5)
CO2: 26 mEq/L (ref 19–32)
Chloride: 103 mEq/L (ref 96–112)
Creatinine, Ser: 0.87 mg/dL (ref 0.50–1.10)
GFR, EST NON AFRICAN AMERICAN: 84 mL/min — AB (ref >90)
Glucose, Bld: 93 mg/dL (ref 70–99)
Potassium: 4 mEq/L (ref 3.7–5.3)
SODIUM: 141 meq/L (ref 137–147)

## 2013-10-13 MED ORDER — FLUCONAZOLE 100 MG PO TABS
100.0000 mg | ORAL_TABLET | Freq: Once | ORAL | Status: AC
  Administered 2013-10-13: 100 mg via ORAL
  Filled 2013-10-13: qty 1

## 2013-10-13 MED ORDER — CEPHALEXIN 500 MG PO CAPS
500.0000 mg | ORAL_CAPSULE | Freq: Four times a day (QID) | ORAL | Status: DC
Start: 2013-10-13 — End: 2014-03-16

## 2013-10-13 MED ORDER — CLINDAMYCIN HCL 300 MG PO CAPS: 300.0000 mg | ORAL_CAPSULE | Freq: Three times a day (TID) | ORAL | Status: DC

## 2013-10-13 NOTE — Progress Notes (Signed)
Indurated area and right axilla still present, but not fluctuant. Patient clinically has improved. No need for acute surgical intervention. Patient cleared for discharge from surgery standpoint. We'll follow for mild this next week. Should continue doxycycline as an outpatient.

## 2013-10-13 NOTE — Discharge Summary (Signed)
Physician Discharge Summary  Mary Cruz:[id number] DOB: [birth year] DOA: 10/09/2013  PCP: Marcial Pacas, DO  Admit date: 10/09/2013 Discharge date: 10/13/2013  Time spent: 35 minutes  Recommendations for Outpatient Follow-up:  1. Complete full course of clindamycin 10/23/13 2. Recommend at least 2 days of oral Keflex 500 4 times a day 3. Close follow general surgeon   Discharge Diagnoses:  Principal Problem:   Abscess of axillary fold Active Problems:   Essential hypertension, benign   Chronic pain syndrome   Sepsis affecting skin   Discharge Condition: Stable  Diet recommendation: Regular  Filed Weights   10/10/13 0157 10/11/13 0500 10/12/13 0500  Weight: 104.2 kg (229 lb 11.5 oz) 104.1 kg (229 lb 8 oz) 104.2 kg (229 lb 11.5 oz)    History of present illness:  38 year old ?, no history HTN, chronic back pain, irritable bowel syndrome, multiple MRSA infections admitted 10/10/13-patient found to have abscess and axilla I&D performed--cultures came back reported and culture showing MRSA moderate   Hospital Course:  Patient was admitted and kept on IV vancomycin and Zosyn her white count trended down from 14 on admission to 8.3 on day of discharge. She was transitioned to oral clindamycin because she has multiple drug intolerances including that to Bactrim, doxycycline as well as some intolerances to penicillins. It was recommended to continue on double coverage for one or 2 more days of Keflex 500 4 times a day and she can discontinue this after by complete a full 14 days of treatment for the MRSA that has grown. General surgeries saw patient in consult and did not feel that any further surgery was required Her blood pressures were very stable during the hospital stay and she was continued on her regular medications Is noted that she is on soma as well as amitriptyline and Percocet at home-this may need to be discussed with her family physician with regards to therapeutic  duplication.  Consultations: Gen surgery  Discharge Exam: Filed Vitals:   10/13/13 0613  BP: 97/62  Pulse: 80  Temp: 98.4 F (36.9 C)  Resp: 16    General: alert pleasant oriented in nad Respiratory: clear no added sound  Discharge Instructions You were cared for by a hospitalist during your hospital stay. If you have any questions about your discharge medications or the care you received while you were in the hospital after you are discharged, you can call the unit and asked to speak with the hospitalist on call if the hospitalist that took care of you is not available. Once you are discharged, your primary care physician will handle any further medical issues. Please note that NO REFILLS for any discharge medications will be authorized once you are discharged, as it is imperative that you return to your primary care physician (or establish a relationship with a primary care physician if you do not have one) for your aftercare needs so that they can reassess your need for medications and monitor your lab values.  Discharge Instructions   Diet - low sodium heart healthy    Complete by:  As directed      Discharge instructions    Complete by:  As directed   You have been given 30 tablets of clindamycin to complete on 10/23/13 I would recommend at least 2 days of Keflex 500 4 times a day [he had multiple drug intolerances but I feel this would cover a broad spectrum enough and then you can just use the clindamycin] We will try to schedule  a followup appointment with Dr. Arnoldo Morale for him to review your wound -this will need to be probably Monday or Tuesday.  Please get lab work from irregular physician's office in about a week's time     Increase activity slowly    Complete by:  As directed             Medication List         amitriptyline 50 MG tablet  Commonly known as:  ELAVIL  Take 1 tablet (50 mg total) by mouth at bedtime.     carisoprodol 350 MG tablet  Commonly known  as:  SOMA  Take 350 mg by mouth 3 (three) times daily.     cephALEXin 500 MG capsule  Commonly known as:  KEFLEX  Take 1 capsule (500 mg total) by mouth 4 (four) times daily.     clindamycin 300 MG capsule  Commonly known as:  CLEOCIN  Take 1 capsule (300 mg total) by mouth 3 (three) times daily.     lisinopril-hydrochlorothiazide 10-12.5 MG per tablet  Commonly known as:  PRINZIDE,ZESTORETIC  TAKE 1 TABLET BY MOUTH DAILY.     omeprazole 40 MG capsule  Commonly known as:  PRILOSEC  Take 40 mg by mouth daily.     oxyCODONE-acetaminophen 5-325 MG per tablet  Commonly known as:  PERCOCET/ROXICET  Take 1 tablet by mouth every 4 (four) hours as needed for severe pain.       Allergies  Allergen Reactions  . Amoxicillin     Vomiting and diarrhea  . Bactrim [Sulfamethoxazole-Tmp Ds] Other (See Comments)    Makes very sick  . Celebrex [Celecoxib] Swelling    Body swelling  . Doxycycline     sickness  . Erythromycin Other (See Comments)    Caused UTI  . Hydromorphone Hcl Other (See Comments)    hallucinations  . Ketorolac Tromethamine Itching  . Lyrica [Pregabalin] Swelling    Body swelling  . Nabumetone Itching  . Pentazocine Lactate Itching and Other (See Comments)    Talwin caused hallucinations       Follow-up Information   Follow up with Jamesetta So, MD. Schedule an appointment as soon as possible for a visit on 10/21/2013.   Specialty:  General Surgery   Contact information:   [address] [place] [address] [phone]        The results of significant diagnostics from this hospitalization (including imaging, microbiology, ancillary and laboratory) are listed below for reference.    Significant Diagnostic Studies: US Breast Ltd Uni Right Inc Axilla  10/10/2013   CLINICAL DATA:  axilla abscess, post I&D, now with sepsis  EXAM: ULTRASOUND OF THE RIGHT AXILLA  COMPARISON:  NONE AVAILABLE  FINDINGS: Ultrasound is performed, showing poorly  marginated hypoechoic region underlying the incision site, centrally in without to vascular flow signal. There is some minimal surrounding flow signal identified. No distinct mass or simple fluid collection.  IMPRESSION: 1. Nonspecific hypoechoic avascular poorly marginated subcutaneous processes deep to incision. Residual/recurrent abscess remains a possibility. CT with contrast suggested for more complete evaluation if clinical concern persists.   Electronically Signed   By: Arne Cleveland M.D.   On: 10/10/2013 17:09    Microbiology: Recent Results (from the past 240 hour(s))  CULTURE, BLOOD (ROUTINE X 2)     Status: None   Collection Time    10/09/13 11:47 PM      Result Value Ref Range Status   Specimen Description BLOOD RIGHT ANTECUBITAL  Final   Special Requests BOTTLES DRAWN AEROBIC AND ANAEROBIC 8CC   Final   Culture NO GROWTH 2 DAYS   Final   Report Status PENDING   Incomplete  CULTURE, ROUTINE-ABSCESS     Status: None   Collection Time    10/10/13 12:25 AM      Result Value Ref Range Status   Specimen Description ABSCESS RIGHT AXILLA   Final   Special Requests NONE   Final   Gram Stain     Final   Value: FEW WBC PRESENT,BOTH PMN AND MONONUCLEAR     NO SQUAMOUS EPITHELIAL CELLS SEEN     MODERATE GRAM POSITIVE COCCI     IN PAIRS IN CLUSTERS     Performed at [facility]   Culture     Final   Value: MODERATE METHICILLIN RESISTANT STAPHYLOCOCCUS AUREUS     Note: RIFAMPIN AND GENTAMICIN SHOULD NOT BE USED AS SINGLE DRUGS FOR TREATMENT OF STAPH INFECTIONS. This organism DOES NOT demonstrate inducible Clindamycin resistance in vitro. CRITICAL RESULT CALLED TO, READ BACK BY AND VERIFIED WITH: VALDESE DILDY @      9:20AM 10/13/13 BY DWEEKS     Performed at [facility]   Report Status 10/13/2013 FINAL   Final   Organism ID, Bacteria METHICILLIN RESISTANT STAPHYLOCOCCUS AUREUS   Final  URINE CULTURE     Status: None   Collection Time    10/10/13 12:34 AM      Result  Value Ref Range Status   Specimen Description URINE, CLEAN CATCH   Final   Special Requests NONE   Final   Culture  Setup Time     Final   Value: 10/10/2013 20:41     Performed at [facility]     Final   Value: 40,000 COLONIES/ML     Performed at [facility]   Culture     Final   Value: Multiple bacterial morphotypes present, none predominant. Suggest appropriate recollection if clinically indicated.     Performed at [facility]   Report Status 10/11/2013 FINAL   Final  CULTURE, BLOOD (ROUTINE X 2)     Status: None   Collection Time    10/10/13 12:43 AM      Result Value Ref Range Status   Specimen Description BLOOD LEFT ARM   Final   Special Requests BOTTLES DRAWN AEROBIC AND ANAEROBIC 6CC   Final   Culture NO GROWTH 2 DAYS   Final   Report Status PENDING   Incomplete  MRSA PCR SCREENING     Status: Abnormal   Collection Time    10/10/13  1:47 AM      Result Value Ref Range Status   MRSA by PCR POSITIVE (*) NEGATIVE Final   Comment:            The GeneXpert MRSA Assay (FDA     approved for NASAL specimens     only), is one component of a     comprehensive MRSA colonization     surveillance program. It is not     intended to diagnose MRSA     infection nor to guide or     monitor treatment for     MRSA infections.     RESULT CALLED TO, READ BACK BY AND VERIFIED WITH:     BREWER,D @ 0442 ON 10/10/13 BY WOODIE,J     Labs: Basic Metabolic Panel:  Recent Labs Lab 10/09/13 2330  10/10/13 0719 10/11/13 0548 10/12/13 0414 10/13/13 0605  NA 136* 140 140 140 141  K 3.4* 3.6* 3.6* 3.5* 4.0  CL 99 102 104 103 103  CO2 25 26 26 25 26   GLUCOSE 121* 108* 104* 120* 93  BUN 9 7 5* 6 6  CREATININE 0.97 0.91 0.82 0.86 0.87  CALCIUM 9.1 8.5 8.5 8.4 9.0   Liver Function Tests:  Recent Labs Lab 10/10/13 0719  AST 11  ALT 13  ALKPHOS 95  BILITOT 0.5  PROT 6.9  ALBUMIN 3.1*   No results found for this basename: LIPASE, AMYLASE,   in the last 168 hours No results found for this basename: AMMONIA,  in the last 168 hours CBC:  Recent Labs Lab 10/09/13 2330 10/10/13 0719 10/11/13 0548 10/12/13 0414 10/13/13 0605  WBC 14.8* 14.1* 13.1* 10.9* 8.3  NEUTROABS 12.5*  --   --   --   --   HGB 13.2 12.0 12.2 11.2* 11.5*  HCT 38.4 36.0 36.4 33.7* 34.5*  MCV 92.5 93.8 94.3 94.4 94.3  PLT 244 238 229 257 303   Cardiac Enzymes: No results found for this basename: CKTOTAL, CKMB, CKMBINDEX, TROPONINI,  in the last 168 hours BNP: BNP (last 3 results) No results found for this basename: PROBNP,  in the last 8760 hours CBG: No results found for this basename: GLUCAP,  in the last 168 hours     Signed:  Nita Sells  [facility] 10/13/2013, 9:57 AM

## 2013-10-13 NOTE — Progress Notes (Signed)
Patient discharged home with instructions given on medications,and follow up visits,patient verbalized understanding.No c/o pain or discomfort noted. Prescriptions sent to Pharmacy of choice documented on AVS.Staff accompanied patient to car.

## 2013-10-14 NOTE — Progress Notes (Signed)
UR chart review completed.  

## 2013-10-16 LAB — CULTURE, BLOOD (ROUTINE X 2)
Culture: NO GROWTH
Culture: NO GROWTH

## 2013-10-19 ENCOUNTER — Ambulatory Visit: Payer: Managed Care, Other (non HMO) | Admitting: Family Medicine

## 2013-10-21 ENCOUNTER — Ambulatory Visit (INDEPENDENT_AMBULATORY_CARE_PROVIDER_SITE_OTHER): Payer: Managed Care, Other (non HMO) | Admitting: Family Medicine

## 2013-10-21 ENCOUNTER — Encounter: Payer: Self-pay | Admitting: Family Medicine

## 2013-10-21 VITALS — BP 136/89 | HR 99 | Ht 64.0 in | Wt 231.0 lb

## 2013-10-21 DIAGNOSIS — Z09 Encounter for follow-up examination after completed treatment for conditions other than malignant neoplasm: Secondary | ICD-10-CM

## 2013-10-21 DIAGNOSIS — B3731 Acute candidiasis of vulva and vagina: Secondary | ICD-10-CM

## 2013-10-21 DIAGNOSIS — A4902 Methicillin resistant Staphylococcus aureus infection, unspecified site: Secondary | ICD-10-CM

## 2013-10-21 DIAGNOSIS — K2100 Gastro-esophageal reflux disease with esophagitis, without bleeding: Secondary | ICD-10-CM

## 2013-10-21 DIAGNOSIS — D72829 Elevated white blood cell count, unspecified: Secondary | ICD-10-CM

## 2013-10-21 DIAGNOSIS — B373 Candidiasis of vulva and vagina: Secondary | ICD-10-CM

## 2013-10-21 LAB — CBC
HCT: 39 % (ref 36.0–46.0)
Hemoglobin: 13 g/dL (ref 12.0–15.0)
MCH: 30.9 pg (ref 26.0–34.0)
MCHC: 33.3 g/dL (ref 30.0–36.0)
MCV: 92.6 fL (ref 78.0–100.0)
Platelets: 357 10*3/uL (ref 150–400)
RBC: 4.21 MIL/uL (ref 3.87–5.11)
RDW: 13.3 % (ref 11.5–15.5)
WBC: 8.7 10*3/uL (ref 4.0–10.5)

## 2013-10-21 MED ORDER — FLUCONAZOLE 150 MG PO TABS
ORAL_TABLET | ORAL | Status: AC
Start: 2013-10-21 — End: 2013-10-27

## 2013-10-21 MED ORDER — OMEPRAZOLE 40 MG PO CPDR: 40.0000 mg | DELAYED_RELEASE_CAPSULE | Freq: Every day | ORAL | Status: DC

## 2013-10-21 NOTE — Progress Notes (Signed)
CC: Mary Cruz is a 38 y.o. female is here for hospital f/u   Subjective: HPI:  Hospital followup for MRSA abscess in the right axilla. Patient states that prior to presentation at the emergency room she was experiencing confusion, fever of 102.4, fatigue and was brought to the emergency room by her husband. She was found to have a quite large abscess in the right axilla extending into the lateral breast. She was started on vancomycin and Zofran and eventually switched over to Keflex and clindamycin after wound culture returned showing MRSA sensitive to clindamycin. Admit date: 10/09/2013 Discharge date: 10/13/2013. By the day of discharge her leukocytosis had resolved. By day of discharge her fever had resolved, she continued to have fatigue but no longer had confusion. She states she feels like she's somewhere around 90% better with respect to fatigue and otherwise in her regular state of health other than a few complaints below. She denies any breast pain nor axilla pain. She denies any breast surgical changes discharge. She denies any new skin lesions.    complains of a rash on the external aspect of her vagina that begin to 3 days after she was started on vancomycin and Zosyn. Symptoms are moderate in severity, itchy, persistent, slightly improved on Diflucan when in the hospital but slowly worsening now that it's been a few days since she took her last dose. She denies vaginal discharge or any other genitourinary complaints.   Complains of epigastric pain described as burning that is radiating up behind the sternum. It is worse after lying down after eating large meals. There is no exertional component to the pain. It is otherwise nonradiating and moderate in severity. She's had identical symptoms in the past that responded well to antacids. She denies vomiting, decreased appetite constipation or diarrhea    Review Of Systems Outlined In HPI  Past Medical History  Diagnosis Date  .  Osteoarthritis   . Hypertension   . Pain     chronic regional pain syndrome    Past Surgical History  Procedure Laterality Date  . Spinal cord stimulator    . Occipital nerve stimulator insertion    . Cholecystectomy  2000  . Knee surgery    . Shoulder surgery    . Tubal ligation  2000  . Endometrial ablation    . Spinal cord stimulator implant  2008   Family History  Problem Relation Age of Onset  . Depression Mother     maternal grandmother  . Asthma Mother   . Fibromyalgia      aunt  . Stroke      grandmother  . Thyroid disease      grandmother    History   Social History  . Marital Status: Married    Spouse Name: N/A    Number of Children: N/A  . Years of Education: N/A   Occupational History  . Not on file.   Social History Main Topics  . Smoking status: Former Smoker    Quit date: 02/18/2013  . Smokeless tobacco: Not on file  . Alcohol Use: No  . Drug Use: No  . Sexual Activity: Yes    Partners: Male    Birth Control/ Protection: Surgical   Other Topics Concern  . Not on file   Social History Narrative  . No narrative on file     Objective: BP 136/89  Pulse 99  Ht 5\' 4"  (1.626 m)  Wt 231 lb (104.781 kg)  BMI 39.63 kg/m2  LMP 08/25/2013  General: Alert and Oriented, No Acute Distress HEENT: Pupils equal, round, reactive to light. Conjunctivae clear.  Moist mucous membranes pharynx unremarkable Lungs: Clear to auscultation bilaterally, no wheezing/ronchi/rales.  Comfortable work of breathing. Good air movement. Cardiac: Regular rate and rhythm. Normal S1/S2.  No murmurs, rubs, nor gallops.   Abdomen: Normal bowel sounds, soft and non tender without palpable masses. Mental Status: No depression, anxiety, nor agitation. Skin: Warm and dry. The right axilla has a 2 mm subcutaneous nontender nodule which is mobile and in the posterior aspect of the axilla. There is no other palpable masses nor palpable lymphadenopathy nor any redness or swelling  in the right axilla.  Assessment & Plan: Dai was seen today for hospital f/u.  Diagnoses and associated orders for this visit:  Leukocytosis, unspecified - CBC  MRSA infection  Vaginal candidiasis - fluconazole (DIFLUCAN) 150 MG tablet; Take one tab, may take second tab if no improvement after 72 hours.  Gastroesophageal reflux disease with esophagitis - omeprazole (PRILOSEC) 40 MG capsule; Take 1 capsule (40 mg total) by mouth daily.  Hospital discharge follow-up    MRSA infection: Resolved, discussed signs and symptoms that would be suggestive of lingering abscess being That day from clindamycin that would require CT scan of the chest with contrast. For now continue clindamycin and Keflex. Encouraged her to continue with chlorhexidine body wash about every to 3 days throughout the week to help lower MRSA colonization. Vaginal candidiasis: Start fluconazole and continue until symptoms resolve after cessation of antibiotics GERD: Uncontrolled restart Prilosec Final recheck of white count for leukocytosis with normal  40 minutes spent face-to-face during visit today of which at least 50% was counseling or coordinating care regarding: 1. Leukocytosis, unspecified   2. MRSA infection   3. Vaginal candidiasis   4. Gastroesophageal reflux disease with esophagitis   5. Hospital discharge follow-up    and reviewing hospital records in the presence of the patient    Return if symptoms worsen or fail to improve.

## 2013-10-22 ENCOUNTER — Ambulatory Visit (INDEPENDENT_AMBULATORY_CARE_PROVIDER_SITE_OTHER): Payer: Managed Care, Other (non HMO) | Admitting: Family Medicine

## 2013-10-22 ENCOUNTER — Encounter: Payer: Self-pay | Admitting: Family Medicine

## 2013-10-22 VITALS — Resp 18

## 2013-11-03 ENCOUNTER — Telehealth: Payer: Self-pay | Admitting: Family Medicine

## 2013-11-03 DIAGNOSIS — Z23 Encounter for immunization: Secondary | ICD-10-CM

## 2013-11-03 NOTE — Telephone Encounter (Signed)
Added flu shot to encounter

## 2013-11-03 NOTE — Telephone Encounter (Signed)
Seth Bake, When this patient's children Thurmond Butts and Lilia Pro) were here on 9//11 the patient was accompanying them and I believe she received a flu shot however Rodman Key does not see a "vaccine and administration charge" on his end.  Do you recall this encounter?

## 2013-12-31 ENCOUNTER — Ambulatory Visit: Payer: Self-pay | Admitting: Pain Medicine

## 2014-01-04 ENCOUNTER — Other Ambulatory Visit: Payer: Self-pay | Admitting: Family Medicine

## 2014-01-17 ENCOUNTER — Other Ambulatory Visit: Payer: Self-pay | Admitting: Family Medicine

## 2014-03-16 ENCOUNTER — Ambulatory Visit (INDEPENDENT_AMBULATORY_CARE_PROVIDER_SITE_OTHER): Payer: BLUE CROSS/BLUE SHIELD | Admitting: Family Medicine

## 2014-03-16 ENCOUNTER — Encounter: Payer: Self-pay | Admitting: Family Medicine

## 2014-03-16 ENCOUNTER — Ambulatory Visit: Payer: Managed Care, Other (non HMO) | Admitting: Family Medicine

## 2014-03-16 VITALS — BP 131/82 | HR 103 | Wt 234.0 lb

## 2014-03-16 DIAGNOSIS — F411 Generalized anxiety disorder: Secondary | ICD-10-CM | POA: Diagnosis not present

## 2014-03-16 HISTORY — DX: Generalized anxiety disorder: F41.1

## 2014-03-16 MED ORDER — ESCITALOPRAM OXALATE 10 MG PO TABS: 10.0000 mg | ORAL_TABLET | Freq: Every day | ORAL | Status: DC

## 2014-03-16 NOTE — Progress Notes (Signed)
CC: Mary Cruz is a 39 y.o. female is here for Anxiety   Subjective: HPI:  Complains of subjective anxiety, panic attacks described as shortness of breath and rapid heartbeat during stressful situations. Symptoms present for the last 1 or 2 months. They seem to be worsening a weekly basis now moderate in severity. Accompanied by a short fuse with very little patience and verbal outbursts toward family members for insignificant issues. She's been trying to change this on her own but has been unsuccessful. She's also noticed that she's having more nightmares about events that happened during her childhood. Specifically she is having flashbacks about poor parenting with regards to her mother.  Symptoms are worse when her husband is out on the road, she doesn't have transportation at that time and is stuck at home with little access to socialization or hobbies. She denies any exertional component to the above complaints. She denies chest pain, unintentional weight loss or gain, abdominal pain, regurgitation fevers nor chills. There have been no thoughts of wanting to harm herself or others.  Review Of Systems Outlined In HPI  Past Medical History  Diagnosis Date  . Osteoarthritis   . Hypertension   . Pain     chronic regional pain syndrome    Past Surgical History  Procedure Laterality Date  . Spinal cord stimulator    . Occipital nerve stimulator insertion    . Cholecystectomy  2000  . Knee surgery    . Shoulder surgery    . Tubal ligation  2000  . Endometrial ablation    . Spinal cord stimulator implant  2008   Family History  Problem Relation Age of Onset  . Depression Mother     maternal grandmother  . Asthma Mother   . Fibromyalgia      aunt  . Stroke      grandmother  . Thyroid disease      grandmother    History   Social History  . Marital Status: Married    Spouse Name: N/A    Number of Children: N/A  . Years of Education: N/A   Occupational History  . Not  on file.   Social History Main Topics  . Smoking status: Former Smoker    Quit date: 02/18/2013  . Smokeless tobacco: Not on file  . Alcohol Use: No  . Drug Use: No  . Sexual Activity:    Partners: Male    Birth Control/ Protection: Surgical   Other Topics Concern  . Not on file   Social History Narrative     Objective: BP 131/82 mmHg  Pulse 103  Wt 234 lb (106.142 kg)  Vital signs reviewed. General: Alert and Oriented, No Acute Distress HEENT: Pupils equal, round, reactive to light. Conjunctivae clear.  External ears unremarkable.  Moist mucous membranes. Lungs: Clear and comfortable work of breathing, speaking in full sentences without accessory muscle use. Cardiac: Regular rate and rhythm.  Neuro: CN II-XII grossly intact, gait normal. Extremities: No peripheral edema.  Strong peripheral pulses.  Mental Status: mild anxiety without depression or agitation. Logical thought process Skin: Warm and dry.  Assessment & Plan: Mary Cruz was seen today for anxiety.  Diagnoses and associated orders for this visit:  Generalized anxiety disorder - escitalopram (LEXAPRO) 10 MG tablet; Take 1 tablet (10 mg total) by mouth daily.    Generalized anxiety disorder: Start Lexapro, her daughter sees a Social worker have encouraged her to look into counseling as well.  Return in about 4 weeks (  around 04/13/2014) for mood follow up.

## 2014-04-13 ENCOUNTER — Ambulatory Visit: Payer: BLUE CROSS/BLUE SHIELD | Admitting: Family Medicine

## 2014-04-20 ENCOUNTER — Ambulatory Visit: Payer: BLUE CROSS/BLUE SHIELD | Admitting: Family Medicine

## 2014-05-28 ENCOUNTER — Other Ambulatory Visit: Payer: Self-pay | Admitting: Family Medicine

## 2014-06-01 ENCOUNTER — Ambulatory Visit: Payer: BLUE CROSS/BLUE SHIELD | Admitting: Family Medicine

## 2014-06-02 ENCOUNTER — Ambulatory Visit (INDEPENDENT_AMBULATORY_CARE_PROVIDER_SITE_OTHER): Payer: BLUE CROSS/BLUE SHIELD | Admitting: Family Medicine

## 2014-06-02 ENCOUNTER — Encounter: Payer: Self-pay | Admitting: Family Medicine

## 2014-06-02 VITALS — BP 132/89 | HR 107 | Temp 97.5°F | Wt 234.0 lb

## 2014-06-02 DIAGNOSIS — I10 Essential (primary) hypertension: Secondary | ICD-10-CM

## 2014-06-02 DIAGNOSIS — R300 Dysuria: Secondary | ICD-10-CM | POA: Diagnosis not present

## 2014-06-02 DIAGNOSIS — N390 Urinary tract infection, site not specified: Secondary | ICD-10-CM

## 2014-06-02 DIAGNOSIS — G4700 Insomnia, unspecified: Secondary | ICD-10-CM

## 2014-06-02 DIAGNOSIS — F411 Generalized anxiety disorder: Secondary | ICD-10-CM

## 2014-06-02 LAB — POCT URINALYSIS DIPSTICK
Bilirubin, UA: NEGATIVE
GLUCOSE UA: NEGATIVE
KETONES UA: NEGATIVE
LEUKOCYTES UA: NEGATIVE
Nitrite, UA: NEGATIVE
Protein, UA: NEGATIVE
SPEC GRAV UA: 1.02
Urobilinogen, UA: 1
pH, UA: 5.5

## 2014-06-02 MED ORDER — AMITRIPTYLINE HCL 100 MG PO TABS: 100.0000 mg | ORAL_TABLET | Freq: Every day | ORAL | Status: DC

## 2014-06-02 MED ORDER — NITROFURANTOIN MONOHYD MACRO 100 MG PO CAPS: 100.0000 mg | ORAL_CAPSULE | Freq: Two times a day (BID) | ORAL | Status: AC

## 2014-06-02 NOTE — Progress Notes (Signed)
CC: Mary Cruz is a 39 y.o. female is here for Urinary Tract Infection   Subjective: HPI:  Complains of dysuria and suprapubic pain that has been present for the past 1-1/2 weeks. It's been coming and going every 2-3 days she keeps thinking notes immediate better only for to return to moderate degree of pain. Interventions have included her increasing fluids and reducing caffeine. No other interventions as of yet. Accompanied by nausea occasionally.  Follow essential hypertension: Continues to take lisinopril on a daily basis. She tells me blood pressures at home are always in the normotensive range. Denies chest pain shortness of breath orthopnea peripheral edema  Follow-up generalized anxiety: Since taking Lexapro she and her family have noticed that she is acting much less anxious. She is quite happy with the medication. Over the past week she's had a little return of her anxiousness and nervousness due to finding out that her grandmother has been hospitalized. Further describes anxiety as irritability and will be falling asleep at night.   Review Of Systems Outlined In HPI  Past Medical History  Diagnosis Date  . Osteoarthritis   . Hypertension   . Pain     chronic regional pain syndrome    Past Surgical History  Procedure Laterality Date  . Spinal cord stimulator    . Occipital nerve stimulator insertion    . Cholecystectomy  2000  . Knee surgery    . Shoulder surgery    . Tubal ligation  2000  . Endometrial ablation    . Spinal cord stimulator implant  2008   Family History  Problem Relation Age of Onset  . Depression Mother     maternal grandmother  . Asthma Mother   . Fibromyalgia      aunt  . Stroke      grandmother  . Thyroid disease      grandmother    History   Social History  . Marital Status: Married    Spouse Name: N/A  . Number of Children: N/A  . Years of Education: N/A   Occupational History  . Not on file.   Social History Main Topics   . Smoking status: Former Smoker    Quit date: 02/18/2013  . Smokeless tobacco: Not on file  . Alcohol Use: No  . Drug Use: No  . Sexual Activity:    Partners: Male    Birth Control/ Protection: Surgical   Other Topics Concern  . Not on file   Social History Narrative     Objective: BP 132/89 mmHg  Pulse 107  Temp(Src) 97.5 F (36.4 C) (Oral)  Wt 234 lb (106.142 kg)  Vital signs reviewed. General: Alert and Oriented, No Acute Distress HEENT: Pupils equal, round, reactive to light. Conjunctivae clear.  External ears unremarkable.  Moist mucous membranes. Lungs: Clear and comfortable work of breathing, speaking in full sentences without accessory muscle use. Cardiac: Regular rate and rhythm.  Neuro: CN II-XII grossly intact, gait normal. Extremities: No peripheral edema.  Strong peripheral pulses.  Mental Status: No depression, anxiety, nor agitation. Logical though process. Skin: Warm and dry. Assessment & Plan: Mary Cruz was seen today for urinary tract infection.  Diagnoses and all orders for this visit:  Dysuria Orders: -     Urinalysis Dipstick -     Urine Culture  Essential hypertension, benign  UTI (lower urinary tract infection) Orders: -     nitrofurantoin, macrocrystal-monohydrate, (MACROBID) 100 MG capsule; Take 1 capsule (100 mg total) by mouth 2 (  two) times daily.  Insomnia Orders: -     amitriptyline (ELAVIL) 100 MG tablet; Take 1 tablet (100 mg total) by mouth at bedtime.  Generalized anxiety disorder   Urinalysis suggestive of UTI therefore start Macrobid pending culture Essential hypertension: Improving controlled continue lisinopril focusing on sodium restriction Insomnia: Increasing amitriptyline Generalized anxiety disorder: Overall stable with the exception of her recent mild exacerbation, no need to change Lexapro at this time continue 10 mg formulation and if anxiety persists despite improved sleeping with amitriptyline call and I will  increase to 20 mg of Lexapro daily.  Return if symptoms worsen or fail to improve.

## 2014-06-04 LAB — URINE CULTURE: Colony Count: 45000

## 2014-07-08 ENCOUNTER — Telehealth: Payer: Self-pay | Admitting: *Deleted

## 2014-07-08 DIAGNOSIS — F411 Generalized anxiety disorder: Secondary | ICD-10-CM

## 2014-07-08 MED ORDER — CLONAZEPAM 0.5 MG PO TABS: 0.2500 mg | ORAL_TABLET | Freq: Two times a day (BID) | ORAL | Status: DC | PRN

## 2014-07-08 NOTE — Telephone Encounter (Signed)
Pt called (3:27) and would like something called in for her anxiety. She states the lexapro does work but her grandmother died last weekend and wants something to help her get through.please advise

## 2014-07-08 NOTE — Telephone Encounter (Signed)
Rx at urgent care

## 2014-07-08 NOTE — Telephone Encounter (Signed)
Leaving Rx upfront, called patient about anxiety.

## 2014-07-30 ENCOUNTER — Emergency Department (HOSPITAL_COMMUNITY)
Admission: EM | Admit: 2014-07-30 | Discharge: 2014-07-30 | Disposition: A | Discharge status: Home / Self Care | Payer: BLUE CROSS/BLUE SHIELD | Attending: Emergency Medicine | Admitting: Emergency Medicine

## 2014-07-30 ENCOUNTER — Encounter (HOSPITAL_COMMUNITY): Payer: Self-pay | Admitting: Emergency Medicine

## 2014-07-30 ENCOUNTER — Emergency Department (HOSPITAL_COMMUNITY): Payer: BLUE CROSS/BLUE SHIELD

## 2014-07-30 DIAGNOSIS — Y929 Unspecified place or not applicable: Secondary | ICD-10-CM | POA: Insufficient documentation

## 2014-07-30 DIAGNOSIS — I10 Essential (primary) hypertension: Secondary | ICD-10-CM | POA: Insufficient documentation

## 2014-07-30 DIAGNOSIS — S63501A Unspecified sprain of right wrist, initial encounter: Secondary | ICD-10-CM

## 2014-07-30 DIAGNOSIS — G894 Chronic pain syndrome: Secondary | ICD-10-CM | POA: Insufficient documentation

## 2014-07-30 DIAGNOSIS — Z8739 Personal history of other diseases of the musculoskeletal system and connective tissue: Secondary | ICD-10-CM | POA: Insufficient documentation

## 2014-07-30 DIAGNOSIS — W010XXA Fall on same level from slipping, tripping and stumbling without subsequent striking against object, initial encounter: Secondary | ICD-10-CM | POA: Diagnosis not present

## 2014-07-30 DIAGNOSIS — S53401A Unspecified sprain of right elbow, initial encounter: Secondary | ICD-10-CM

## 2014-07-30 DIAGNOSIS — Y998 Other external cause status: Secondary | ICD-10-CM | POA: Diagnosis not present

## 2014-07-30 DIAGNOSIS — Z792 Long term (current) use of antibiotics: Secondary | ICD-10-CM | POA: Diagnosis not present

## 2014-07-30 DIAGNOSIS — Z880 Allergy status to penicillin: Secondary | ICD-10-CM | POA: Insufficient documentation

## 2014-07-30 DIAGNOSIS — Y9389 Activity, other specified: Secondary | ICD-10-CM | POA: Insufficient documentation

## 2014-07-30 DIAGNOSIS — Z79899 Other long term (current) drug therapy: Secondary | ICD-10-CM | POA: Diagnosis not present

## 2014-07-30 DIAGNOSIS — S6991XA Unspecified injury of right wrist, hand and finger(s), initial encounter: Secondary | ICD-10-CM | POA: Diagnosis present

## 2014-07-30 HISTORY — DX: Fibromyalgia: M79.7

## 2014-07-30 MED ORDER — IBUPROFEN 800 MG PO TABS
800.0000 mg | ORAL_TABLET | Freq: Once | ORAL | Status: AC
  Administered 2014-07-30: 800 mg via ORAL
  Filled 2014-07-30: qty 1

## 2014-07-30 NOTE — ED Notes (Signed)
Pt. Given ice pack to apply to arm.

## 2014-07-30 NOTE — ED Provider Notes (Signed)
CSN: [id number]     Arrival date & time 07/30/14  0026 History  This chart was scribed for Mary Fraise, MD by Eustaquio Maize, ED Scribe. This patient was seen in room [facility]16A/[facility]A and the patient's care was started at 12:43 AM.    Chief Complaint  Patient presents with  . Arm Pain   Patient is a 39 y.o. female presenting with arm pain. The history is provided by the patient. No language interpreter was used.  Arm Pain This is a new problem. The current episode started 12 to 24 hours ago. Pertinent negatives include no chest pain, no abdominal pain, no headaches and no shortness of breath.     HPI Comments: Mary Cruz is a 39 y.o. female who presents to the Emergency Department complaining of right arm pain s/p ground level fall that occurred around 1 PM today (12 hours ago). Pt states that she slipped and tried to catch herself with her right arm, causing the pain. Pt believes she landed on her right arm as well. The pain is mostly localized in her right wrist and right elbow. Denies head injury or LOC. Denies any other associated symptoms.    Past Medical History  Diagnosis Date  . Osteoarthritis   . Hypertension   . Pain     chronic regional pain syndrome   Past Surgical History  Procedure Laterality Date  . Spinal cord stimulator    . Occipital nerve stimulator insertion    . Cholecystectomy  2000  . Knee surgery    . Shoulder surgery    . Tubal ligation  2000  . Endometrial ablation    . Spinal cord stimulator implant  2008   Family History  Problem Relation Age of Onset  . Depression Mother     maternal grandmother  . Asthma Mother   . Fibromyalgia      aunt  . Stroke      grandmother  . Thyroid disease      grandmother   History  Substance Use Topics  . Smoking status: Current Some Day Smoker    Last Attempt to Quit: 02/18/2013  . Smokeless tobacco: Not on file  . Alcohol Use: No   OB History    No data available     Review of Systems   Respiratory: Negative for shortness of breath.   Cardiovascular: Negative for chest pain.  Gastrointestinal: Negative for abdominal pain.  Musculoskeletal: Positive for arthralgias (Right arm).  Skin: Negative for wound.  Neurological: Negative for syncope, weakness, numbness and headaches.      Allergies  Amoxicillin; Bactrim; Celebrex; Doxycycline; Erythromycin; Hydromorphone hcl; Ketorolac tromethamine; Lyrica; Nabumetone; and Pentazocine lactate  Home Medications   Prior to Admission medications   Medication Sig Start Date End Date Taking? Authorizing Provider  amitriptyline (ELAVIL) 100 MG tablet Take 1 tablet (100 mg total) by mouth at bedtime. 06/02/14   Marcial Pacas, DO  carisoprodol (SOMA) 350 MG tablet Take 350 mg by mouth 3 (three) times daily.     Historical Provider, MD  clonazePAM (KLONOPIN) 0.5 MG tablet Take 0.5-1 tablets (0.25-0.5 mg total) by mouth 2 (two) times daily as needed for anxiety. 07/08/14   Sean Hommel, DO  escitalopram (LEXAPRO) 10 MG tablet Take 1 tablet (10 mg total) by mouth daily. 03/16/14   Marcial Pacas, DO  lisinopril-hydrochlorothiazide (PRINZIDE,ZESTORETIC) 10-12.5 MG per tablet TAKE 1 TABLET EVERY DAY 05/30/14   Sean Hommel, DO  omeprazole (PRILOSEC) 40 MG capsule TAKE ONE CAPSULE EVERY DAY  05/30/14   Marcial Pacas, DO  oxyCODONE-acetaminophen (PERCOCET/ROXICET) 5-325 MG per tablet Take 1 tablet by mouth every 4 (four) hours as needed for severe pain.    Historical Provider, MD   Triage Vitals: BP 118/78 mmHg  Pulse 98  Temp(Src) 98.4 F (36.9 C) (Oral)  Resp 20  Ht 5\' 4"  (1.626 m)  Wt 240 lb (108.863 kg)  BMI 41.18 kg/m2  SpO2 94%   Physical Exam  Nursing note and vitals reviewed.  CONSTITUTIONAL: Well developed/well nourished HEAD: Normocephalic/atraumatic ENMT: Mucous membranes moist NECK: supple no meningeal signs SPINE/BACK:entire spine nontender CV: S1/S2 noted, no murmurs/rubs/gallops noted LUNGS: Lungs are clear to auscultation  bilaterally, no apparent distress ABDOMEN: soft, nontender, no rebound or guarding, bowel sounds noted throughout abdomen NEURO: Pt is awake/alert/appropriate, moves all extremitiesx4.  No facial droop.   EXTREMITIES: pulses normal/equal, full ROM. Tenderness to palpation of right wrist and right elbow. No snuff box tenderness. Able to range wrist, elbow, and shoulder without difficulty , no clavicle tenderness SKIN: warm, color normal PSYCH: no abnormalities of mood noted, alert and oriented to situation  ED Course  Procedures (including critical care time)  DIAGNOSTIC STUDIES: Oxygen Saturation is 94% on RA, adequate by my interpretation.    COORDINATION OF CARE: 12:45 AM-Discussed treatment plan which includes DG R Wrist and DG R Elbow with pt at bedside and pt agreed to plan.   Pt well appearing Likely sprain Stable for d/c home No signs of occult scaphoid injury  Labs Review Labs Reviewed - No data to display  Imaging Review Dg Elbow Complete Right  07/30/2014   CLINICAL DATA:  Slip and fall injury.  Lateral right elbow pain.  EXAM: RIGHT ELBOW - COMPLETE 3+ VIEW  COMPARISON:  None.  FINDINGS: There is no evidence of fracture, dislocation, or joint effusion. There is no evidence of arthropathy or other focal bone abnormality. Soft tissues are unremarkable.  IMPRESSION: Negative.   Electronically Signed   By: Lucienne Capers M.D.   On: 07/30/2014 01:21   Dg Wrist Complete Right  07/30/2014   CLINICAL DATA:  Ulnar side right wrist pain at. Lateral right elbow pain. Slipped and fell on a wet floor.  EXAM: RIGHT WRIST - COMPLETE 3+ VIEW  COMPARISON:  None.  FINDINGS: There is no evidence of fracture or dislocation. There is no evidence of arthropathy or other focal bone abnormality. Soft tissues are unremarkable.  IMPRESSION: Negative.   Electronically Signed   By: Lucienne Capers M.D.   On: 07/30/2014 01:20     EKG Interpretation None      MDM   Final diagnoses:  Sprain of  right wrist, initial encounter  Sprain of right elbow, initial encounter    Nursing notes including past medical history and social history reviewed and considered in documentation xrays/imaging reviewed by myself and considered during evaluation   I personally performed the services described in this documentation, which was scribed in my presence. The recorded information has been reviewed and is accurate.      Mary Fraise, MD 07/30/14 [phone]

## 2014-07-30 NOTE — ED Notes (Signed)
Pt. Reports slipping earlier and catching herself with right arm. Pt. Reports right hand, wrist, and elbow pain. No obvious deformity noted.

## 2014-09-01 ENCOUNTER — Other Ambulatory Visit: Payer: Self-pay | Admitting: Family Medicine

## 2014-09-11 ENCOUNTER — Other Ambulatory Visit: Payer: Self-pay | Admitting: Family Medicine

## 2014-09-18 ENCOUNTER — Other Ambulatory Visit: Payer: Self-pay | Admitting: Family Medicine

## 2014-09-19 ENCOUNTER — Other Ambulatory Visit: Payer: Self-pay | Admitting: *Deleted

## 2014-09-19 MED ORDER — LISINOPRIL-HYDROCHLOROTHIAZIDE 10-12.5 MG PO TABS: 1.0000 | ORAL_TABLET | Freq: Every day | ORAL | Status: DC

## 2014-10-07 ENCOUNTER — Ambulatory Visit: Payer: BLUE CROSS/BLUE SHIELD | Admitting: Family Medicine

## 2014-10-19 ENCOUNTER — Other Ambulatory Visit: Payer: Self-pay | Admitting: Family Medicine

## 2014-10-27 ENCOUNTER — Ambulatory Visit (INDEPENDENT_AMBULATORY_CARE_PROVIDER_SITE_OTHER): Payer: BLUE CROSS/BLUE SHIELD | Admitting: Family Medicine

## 2014-10-27 ENCOUNTER — Encounter: Payer: Self-pay | Admitting: Family Medicine

## 2014-10-27 ENCOUNTER — Ambulatory Visit: Payer: BLUE CROSS/BLUE SHIELD | Admitting: Family Medicine

## 2014-10-27 VITALS — BP 112/74 | HR 86 | Wt 238.0 lb

## 2014-10-27 DIAGNOSIS — N6310 Unspecified lump in the right breast, unspecified quadrant: Secondary | ICD-10-CM

## 2014-10-27 DIAGNOSIS — K219 Gastro-esophageal reflux disease without esophagitis: Secondary | ICD-10-CM | POA: Diagnosis not present

## 2014-10-27 DIAGNOSIS — I10 Essential (primary) hypertension: Secondary | ICD-10-CM | POA: Diagnosis not present

## 2014-10-27 DIAGNOSIS — M7711 Lateral epicondylitis, right elbow: Secondary | ICD-10-CM

## 2014-10-27 DIAGNOSIS — R51 Headache: Secondary | ICD-10-CM

## 2014-10-27 DIAGNOSIS — F411 Generalized anxiety disorder: Secondary | ICD-10-CM | POA: Diagnosis not present

## 2014-10-27 DIAGNOSIS — Z23 Encounter for immunization: Secondary | ICD-10-CM | POA: Diagnosis not present

## 2014-10-27 DIAGNOSIS — N63 Unspecified lump in breast: Secondary | ICD-10-CM

## 2014-10-27 DIAGNOSIS — E669 Obesity, unspecified: Secondary | ICD-10-CM | POA: Diagnosis not present

## 2014-10-27 DIAGNOSIS — R519 Headache, unspecified: Secondary | ICD-10-CM

## 2014-10-27 LAB — COMPLETE METABOLIC PANEL WITH GFR
ALT: 19 U/L (ref 6–29)
AST: 15 U/L (ref 10–30)
Albumin: 3.9 g/dL (ref 3.6–5.1)
Alkaline Phosphatase: 85 U/L (ref 33–115)
BILIRUBIN TOTAL: 0.3 mg/dL (ref 0.2–1.2)
BUN: 12 mg/dL (ref 7–25)
CALCIUM: 9.4 mg/dL (ref 8.6–10.2)
CHLORIDE: 102 mmol/L (ref 98–110)
CO2: 28 mmol/L (ref 20–31)
CREATININE: 0.89 mg/dL (ref 0.50–1.10)
GFR, Est Non African American: 82 mL/min (ref >=60)
Glucose, Bld: 89 mg/dL (ref 65–99)
Potassium: 3.7 mmol/L (ref 3.5–5.3)
Sodium: 138 mmol/L (ref 135–146)
TOTAL PROTEIN: 6.9 g/dL (ref 6.1–8.1)

## 2014-10-27 MED ORDER — CLONAZEPAM 0.5 MG PO TABS: 0.2500 mg | ORAL_TABLET | Freq: Two times a day (BID) | ORAL | Status: DC | PRN

## 2014-10-27 MED ORDER — FUROSEMIDE 20 MG PO TABS: 20.0000 mg | ORAL_TABLET | Freq: Every day | ORAL | Status: DC | PRN

## 2014-10-27 MED ORDER — LISINOPRIL-HYDROCHLOROTHIAZIDE 10-12.5 MG PO TABS: 1.0000 | ORAL_TABLET | Freq: Every day | ORAL | Status: DC

## 2014-10-27 MED ORDER — OMEPRAZOLE 40 MG PO CPDR: DELAYED_RELEASE_CAPSULE | ORAL | Status: DC

## 2014-10-27 MED ORDER — PHENTERMINE HCL 37.5 MG PO TABS: 37.5000 mg | ORAL_TABLET | Freq: Every day | ORAL | Status: DC

## 2014-10-27 MED ORDER — ESCITALOPRAM OXALATE 20 MG PO TABS: ORAL_TABLET | ORAL | Status: DC

## 2014-10-27 NOTE — Progress Notes (Signed)
CC: Mary Cruz is a 39 y.o. female is here for multiple issues   Subjective: HPI:  Follow-up anxiety: Over the past 3 months she's only used Klonopin 19 times. She tells me it helps with anxiousness and nervousness when she thinks about the death of her grandmother last year. Nothing else seems to make symptoms worse. She's also noticed that Lexapro has made a big difference with reducing some of her irritability, anxiousness and restlessness however she's troubled but she still needs the Klonopin to help the occasional sense of panic. She wants a higher dose of Lexapro. She denies depression or any counseling or harm self or others.  She would like to know there is a higher dose of Protonix. She is trying her best to eat foods that do not aggravate her GERD however still has some acid reflux sensation in the epigastric region a few days out of the week. Pain is nonradiating, does not wake her from sleep, no constipation nor diarrhea. No exertional component to pain.  Follow-up central hypertension: She would like refills on lisinopril-hydrochlorothiazide. No outside blood pressures to report. Denies orthopnea but has had some peripheral edema in the lower extremities if she eats food with high sodium content. Symptoms are always symmetrical and usually only involve the lower extremities. There's been no orthopnea or unilateral swelling. Denies any shortness of breath.  Complains of difficulty losing weight despite trying the best she can with dieting. She does not have time to exercise regularly. She wants that there is a medication to help with portion control. She's never been on medication like this before.  Complains of a right-sided pounding headache that is accompanied by photophobia and nausea that has been occurring most days out of the week for the last month. Improves drastically by drinking caffeine slowly returns within a couple hours. She's had these off and on for many years now.  Denies motor or sensory disturbances  Complains of right elbow pain for the past 3 months. She fell down with her hands outstretched and had an x-ray done that night which showed no abnormality of the elbow. Since then she's had a persistent stabbing pain on the lateral surface of the elbow. It's worse with lifting anything up greater than a bag of groceries. No swelling redness or warmth. No interventions as of yet  She's noticed that tender mass in her right anterior axilla that seems to fluctuate in size on a daily basis. She denies any breast architectural changes, skin changes, or injury to the skin in the right upper extremity or right breast. Denies fevers, chills, night sweats or unintentional weight loss. She has a family history of breast cancer in a second degree relative.     Review Of Systems Outlined In HPI  Past Medical History  Diagnosis Date  . Osteoarthritis   . Hypertension   . Pain     chronic regional pain syndrome  . Fibromyalgia     Past Surgical History  Procedure Laterality Date  . Spinal cord stimulator    . Occipital nerve stimulator insertion    . Cholecystectomy  2000  . Knee surgery    . Shoulder surgery    . Tubal ligation  2000  . Endometrial ablation    . Spinal cord stimulator implant  2008   Family History  Problem Relation Age of Onset  . Depression Mother     maternal grandmother  . Asthma Mother   . Fibromyalgia      aunt  .  Stroke      grandmother  . Thyroid disease      grandmother    Social History   Social History  . Marital Status: Married    Spouse Name: N/A  . Number of Children: N/A  . Years of Education: N/A   Occupational History  . Not on file.   Social History Main Topics  . Smoking status: Current Some Day Smoker    Last Attempt to Quit: 02/18/2013  . Smokeless tobacco: Not on file  . Alcohol Use: No  . Drug Use: No  . Sexual Activity:    Partners: Male    Birth Control/ Protection: Surgical   Other  Topics Concern  . Not on file   Social History Narrative     Objective: BP 112/74 mmHg  Pulse 86  Wt 238 lb (107.956 kg)  General: Alert and Oriented, No Acute Distress HEENT: Pupils equal, round, reactive to light. Conjunctivae clear.  Moist mucous membranes pharynx unremarkable Lungs: Clear and comfortable work of breathing. Cardiac: Regular rate and rhythm.  Extremities: No peripheral edema.  Strong peripheral pulses.  Mental Status: No depression, anxiety, nor agitation. Skin: Warm and dry. Extremities: Her right elbow pain is reproduced with palpation of the lateral epicondyle and also when the elbow is incomplete extension resisting her wrist extension reproduces her pain. Examination of the right axillary region shows no overlying skin changes but she does have a slightly tender half centimeter nodule deep below the skin.  Assessment & Plan: Mary Cruz was seen today for multiple issues.  Diagnoses and all orders for this visit:  Generalized anxiety disorder  Gastroesophageal reflux disease without esophagitis  Essential hypertension, benign -     COMPLETE METABOLIC PANEL WITH GFR -     Vit D  25 hydroxy (rtn osteoporosis monitoring)  Obesity -     COMPLETE METABOLIC PANEL WITH GFR -     Vit D  25 hydroxy (rtn osteoporosis monitoring)  Nonintractable headache, unspecified chronicity pattern, unspecified headache type -     COMPLETE METABOLIC PANEL WITH GFR -     Vit D  25 hydroxy (rtn osteoporosis monitoring)  Anxiety state -     clonazePAM (KLONOPIN) 0.5 MG tablet; Take 0.5-1 tablets (0.25-0.5 mg total) by mouth 2 (two) times daily as needed for anxiety.  Right tennis elbow  Breast mass, right -     MM Digital Diagnostic Unilat R; Future -     US BREAST LTD UNI RIGHT INC AXILLA; Future  Encounter for immunization  Other orders -     escitalopram (LEXAPRO) 20 MG tablet; TAKE 1 TABLET (20 MG TOTAL) BY MOUTH DAILY. -     lisinopril-hydrochlorothiazide  (PRINZIDE,ZESTORETIC) 10-12.5 MG per tablet; Take 1 tablet by mouth daily. -     furosemide (LASIX) 20 MG tablet; Take 1-2 tablets (20-40 mg total) by mouth daily as needed for fluid. -     phentermine (ADIPEX-P) 37.5 MG tablet; Take 1 tablet (37.5 mg total) by mouth daily before breakfast. -     omeprazole (PRILOSEC) 40 MG capsule; TAKE ONE CAPSULE twice a day. -     Flu Vaccine QUAD 36+ mos IM   Generalized anxiety disorder: Uncontrolled increasing Lexapro GERD: Uncontrolled begin twice a day dose of omeprazole, if no benefit will change to Protonix once a day. Essential hypertension: Control, continue lisinopril-HCTZ, adding Lasix for as needed treatment of edema. Encouraged to reduce sodium to minimize swelling/edema. Obesity: Starting phentermine, discussed that this needs to be  revisited on monthly basis if she is interested in refills because blood pressure and weight loss needs to be monitored monthly. Headache has migrainous features therefore I have encouraged her to start on over-the-counter magnesium 400 mg daily, additionally increasing Lexapro should help minimize this. Right tennis elbow: She was given a home exercise plan for rehabilitation of her elbow on a daily basis for the next 3 weeks. Right breast mass: Referral to the breast clinic for ultrasound examination and diagnostic mammography of the right breast.  40 minutes spent face-to-face during visit today of which at least 50% was counseling or coordinating care regarding: 1. Generalized anxiety disorder   2. Gastroesophageal reflux disease without esophagitis   3. Essential hypertension, benign   4. Obesity   5. Nonintractable headache, unspecified chronicity pattern, unspecified headache type   6. Anxiety state   7. Right tennis elbow   8. Breast mass, right   9. Encounter for immunization       Return in about 4 weeks (around 11/24/2014).

## 2014-10-28 ENCOUNTER — Telehealth: Payer: Self-pay | Admitting: Family Medicine

## 2014-10-28 LAB — VITAMIN D 25 HYDROXY (VIT D DEFICIENCY, FRACTURES): VIT D 25 HYDROXY: 22 ng/mL — AB (ref 30–100)

## 2014-10-28 MED ORDER — VITAMIN D (ERGOCALCIFEROL) 1.25 MG (50000 UNIT) PO CAPS: 50000.0000 [IU] | ORAL_CAPSULE | ORAL | Status: DC

## 2014-10-28 NOTE — Telephone Encounter (Signed)
Mary Cruz Will you please let patient know that her blood sugar was normal however her vitamin d level was deficient therefore I've sent a weekly supplement to her pharmacy.

## 2014-10-28 NOTE — Telephone Encounter (Signed)
Pt.notified

## 2014-11-01 ENCOUNTER — Other Ambulatory Visit: Payer: Self-pay | Admitting: Family Medicine

## 2014-11-01 DIAGNOSIS — R2231 Localized swelling, mass and lump, right upper limb: Secondary | ICD-10-CM

## 2014-11-03 ENCOUNTER — Emergency Department (INDEPENDENT_AMBULATORY_CARE_PROVIDER_SITE_OTHER)
Admission: EM | Admit: 2014-11-03 | Discharge: 2014-11-03 | Disposition: A | Discharge status: Home / Self Care | Payer: BLUE CROSS/BLUE SHIELD | Source: Home / Self Care | Attending: Family Medicine | Admitting: Family Medicine

## 2014-11-03 ENCOUNTER — Encounter: Payer: Self-pay | Admitting: Emergency Medicine

## 2014-11-03 DIAGNOSIS — J020 Streptococcal pharyngitis: Secondary | ICD-10-CM

## 2014-11-03 DIAGNOSIS — R300 Dysuria: Secondary | ICD-10-CM

## 2014-11-03 LAB — POCT URINALYSIS DIP (MANUAL ENTRY)
Bilirubin, UA: NEGATIVE
Glucose, UA: NEGATIVE
Ketones, POC UA: NEGATIVE
LEUKOCYTES UA: NEGATIVE
NITRITE UA: NEGATIVE
PH UA: 6.5 (ref 5–8)
Protein Ur, POC: NEGATIVE
Spec Grav, UA: 1.015 (ref 1.005–1.03)
UROBILINOGEN UA: 1 (ref 0–1)

## 2014-11-03 LAB — POCT RAPID STREP A (OFFICE): RAPID STREP A SCREEN: POSITIVE — AB

## 2014-11-03 MED ORDER — PENICILLIN V POTASSIUM 500 MG PO TABS: ORAL_TABLET | ORAL | Status: DC

## 2014-11-03 MED ORDER — FLUCONAZOLE 150 MG PO TABS: 150.0000 mg | ORAL_TABLET | Freq: Once | ORAL | Status: DC

## 2014-11-03 NOTE — Discharge Instructions (Signed)
Try warm salt water gargles for sore throat.  May take Ibuprofen 200mg , 4 tabs every 8 hours with food for sore throat.   Rheumatic Fever Rheumatic fever is a complication of untreated strep throat. It is an inflammation which affects the entire body. Rheumatic fever is most common in children between the ages of 17 and 21. However, it can develop in adults. It may run in families and may be associated with overcrowding. Rheumatic fever can damage the heart and affect the joints, central nervous system, skin, and underlying tissues. Most cases of strep throat do not lead to rheumatic fever when properly treated. Even without treatment, only a few cases will develop rheumatic fever. CAUSES  Rheumatic fever can occur after a throat infection of the bacterium Streptococcus pyogenes, or group A streptococcus. In rheumatic fever, immune system cells that would normally target the bacterium may attack the body's own tissues, especially tissues of the heart, joints, skin, and central nervous system. SYMPTOMS  Problems commonly show up within a few weeks after a strep throat infection. Symptoms vary and can include:  Fever.  Chest pain.  Abdominal pain.  Shortness of breath.  Painless bumps under the skin.  Heart irregularities.  Rash.  Joint pain that moves around. The pain is usually in the elbows, wrists, ankles, and knees.  Swelling, warmth, and redness of the joints.  Rapid, uncoordinated, jerky movements of the facial muscles, hands, and feet (chorea). Severe problems of rheumatic fever involve the heart. These problems come from the permanent damage done to the heart valves. The most common problems involve inflammation of the heart (endocarditis), mitral valve narrowing (mitral stenosis), and aortic valve narrowing (aortic stenosis). Narrowed valves make it harder for the heart to pump blood. This may lead to heart failure and an irregular heartbeat. A patient may also have a leaky  valve. This allows blood to flow in the wrong direction (regurgitation). DIAGNOSIS  Your caregiver may suspect the diagnosis based on an exam after a recent sore throat or upper respiratory infection.  Blood tests may be done to confirm the diagnosis when there is evidence of a previous strep infection.  Electrocardiography (EKG) records the electrical activity of the heart. This indicates inflammation of the heart or poor heart function.  Echocardiography bounces sound waves off your chest. This shows damaged structures of the heart. TREATMENT  There is no cure. The goals of treatment are to destroy any remaining bacteria, relieve symptoms, control inflammation, and prevent recurring episodes.  Medicines that kill germs (antibiotics), such as penicillin, are prescribed to eliminate any remaining bacteria. After that treatment, another course of antibiotics are prescribed to prevent rheumatic fever from coming back. This preventive treatment typically continues until the age of 5. It may continue in an older patient until he or she has taken the antibiotics for a minimum of 5 years or as directed.  Anti-inflammatory pain relievers, such as aspirin or naproxen, may be prescribed to reduce inflammation, fever, and pain. If symptoms are severe, your caregiver may prescribe a corticosteroid, such as prednisone.  Anticonvulsant medicines may be prescribed if involuntary movements of chorea are severe. HOME CARE INSTRUCTIONS   Gargle with warm salt water 3 to 4 times per day, or as instructed, for comfort.  Family members with a sore throat or fever should have a medical exam or throat culture. If there has been a positive throat culture in the family, your caregiver may treat the entire family.  You may return to work when  you feel able.  Only take over-the-counter or prescription medicines for pain, discomfort, or fever as directed by your caregiver. SEEK MEDICAL CARE IF:   You or your child  has an oral temperature above 102 F (38.9 C).  Large glands develop in the neck.  You have a sore throat without cold symptoms.  You develop a rash, cough, or earache.  You have difficulty swallowing anything, including saliva.  You cough up green, yellow-brown, or bloody sputum.  You have pain or discomfort not controlled by medicines. SEEK IMMEDIATE MEDICAL CARE IF:   New symptoms develop. This may include vomiting, severe headache, a stiff or painful neck, chest pain, shortness of breath, or trouble breathing or swallowing.  You or your child develops severe throat pain, drooling, or changes in the voice. Document Released: 09/19/2005 Document Revised: 04/22/2011 Document Reviewed: 06/05/2009 [facility] Patient Information 2015 [place], [place]. This information is not intended to replace advice given to you by your health care provider. Make sure you discuss any questions you have with your health care provider.

## 2014-11-03 NOTE — ED Notes (Signed)
Sore throat, chills fever, body aches, headache, ears hurt Dysuria started last nigh

## 2014-11-03 NOTE — ED Notes (Signed)
Discontinue Urine Culture

## 2014-11-03 NOTE — ED Provider Notes (Signed)
CSN: [id number]     Arrival date & time 11/03/14  1539 History   First MD Initiated Contact with Patient 11/03/14 1613     Chief Complaint  Patient presents with  . Sore Throat    x 3 days  . Dysuria    since last night      HPI Comments: Patient complains of two day history of sore throat, chills, fatigue, headache, and myalgias.  No nasal congestion, but has had occasional cough.  Last night she developed dysuria.  The history is provided by the patient.    Past Medical History  Diagnosis Date  . Osteoarthritis   . Hypertension   . Pain     chronic regional pain syndrome  . Fibromyalgia    Past Surgical History  Procedure Laterality Date  . Spinal cord stimulator    . Occipital nerve stimulator insertion    . Cholecystectomy  2000  . Knee surgery    . Shoulder surgery    . Tubal ligation  2000  . Endometrial ablation    . Spinal cord stimulator implant  2008   Family History  Problem Relation Age of Onset  . Depression Mother     maternal grandmother  . Asthma Mother   . Fibromyalgia      aunt  . Stroke      grandmother  . Thyroid disease      grandmother   Social History  Substance Use Topics  . Smoking status: Current Some Day Smoker    Last Attempt to Quit: 02/18/2013  . Smokeless tobacco: None  . Alcohol Use: No   OB History    No data available     Review of Systems + sore throat + cough No pleuritic pain No wheezing No nasal congestion + post-nasal drainage No sinus pain/pressure No itchy/red eyes ? earache No hemoptysis No SOB No fever, + chills + nausea No vomiting No abdominal pain No diarrhea + dysuria No skin rash + fatigue + myalgias + headache Used OTC meds without relief  Allergies  Amoxicillin; Bactrim; Celebrex; Doxycycline; Erythromycin; Hydromorphone hcl; Ketorolac tromethamine; Lyrica; Nabumetone; and Pentazocine lactate  Home Medications   Prior to Admission medications   Medication Sig Start Date End Date  Taking? Authorizing Provider  amitriptyline (ELAVIL) 100 MG tablet Take 1 tablet (100 mg total) by mouth at bedtime. 06/02/14   Marcial Pacas, DO  carisoprodol (SOMA) 350 MG tablet Take 350 mg by mouth 3 (three) times daily.     Historical Provider, MD  clonazePAM (KLONOPIN) 0.5 MG tablet Take 0.5-1 tablets (0.25-0.5 mg total) by mouth 2 (two) times daily as needed for anxiety. 10/27/14   Sean Hommel, DO  escitalopram (LEXAPRO) 20 MG tablet TAKE 1 TABLET (20 MG TOTAL) BY MOUTH DAILY. 10/27/14   Sean Hommel, DO  fluconazole (DIFLUCAN) 150 MG tablet Take 1 tablet (150 mg total) by mouth once. May repeat in 72 hours. 11/03/14   Kandra Nicolas, MD  furosemide (LASIX) 20 MG tablet Take 1-2 tablets (20-40 mg total) by mouth daily as needed for fluid. 10/27/14   Marcial Pacas, DO  lisinopril-hydrochlorothiazide (PRINZIDE,ZESTORETIC) 10-12.5 MG per tablet Take 1 tablet by mouth daily. 10/27/14   Sean Hommel, DO  omeprazole (PRILOSEC) 40 MG capsule TAKE ONE CAPSULE twice a day. 10/27/14   Marcial Pacas, DO  oxyCODONE-acetaminophen (PERCOCET/ROXICET) 5-325 MG per tablet Take 1 tablet by mouth every 4 (four) hours as needed for severe pain.    Historical Provider, MD  penicillin  v potassium (VEETID) 500 MG tablet Take one tab by mouth twice daily for 10 days 11/03/14   Kandra Nicolas, MD  phentermine (ADIPEX-P) 37.5 MG tablet Take 1 tablet (37.5 mg total) by mouth daily before breakfast. 10/27/14   Marcial Pacas, DO  Vitamin D, Ergocalciferol, (DRISDOL) 50000 UNITS CAPS capsule Take 1 capsule (50,000 Units total) by mouth every 7 (seven) days. Recheck Vitamin D in 3 Months 10/28/14   Marcial Pacas, DO   Meds Ordered and Administered this Visit  Medications - No data to display  BP 111/77 mmHg  Pulse 113  Temp(Src) 98.5 F (36.9 C) (Oral)  Ht 5\' 4"  (1.626 m)  Wt 239 lb (108.41 kg)  BMI 41.00 kg/m2  SpO2 96% No data found.   Physical Exam Nursing notes and Vital Signs reviewed. Appearance:  Patient appears stated  age, and in no acute distress.  Patient is obese (BMI 41.0) Eyes:  Pupils are equal, round, and reactive to light and accomodation.  Extraocular movement is intact.  Conjunctivae are not inflamed  Ears:  Canals normal.  Tympanic membranes normal.  Nose:  Normal turbinates.  No sinus tenderness.    Pharynx:   Erythematous Neck:  Supple.   Tender tonsillar nodes are palpated bilaterally  Lungs:  Clear to auscultation.  Breath sounds are equal.  Moving air well. Heart:  Regular rate and rhythm without murmurs, rubs, or gallops.  Abdomen:  Nontender without masses or hepatosplenomegaly.  Bowel sounds are present.  No CVA or flank tenderness.  Extremities:  No edema.  No calf tenderness Skin:  No rash present.   ED Course  Procedures  None    Labs Reviewed  POCT URINALYSIS DIP (MANUAL ENTRY) - Abnormal; Notable for the following:    Blood, UA trace-lysed (*)    All other components within normal limits  POCT RAPID STREP A (OFFICE) - Abnormal; Notable for the following:    Rapid Strep A Screen Positive (*)    All other components within normal limits  URINE CULTURE     MDM   1. Dysuria; ?UTI   2. Acute streptococcal pharyngitis    Urine culture pending Begin PenVK 500mg  BID for 10 days.  Rx for Diflucan at patient's request. Try warm salt water gargles for sore throat.  May take Ibuprofen 200mg , 4 tabs every 8 hours with food for sore throat. Followup with Family Doctor if not improved in one week.     Kandra Nicolas, MD 11/07/14 [phone]

## 2014-11-10 ENCOUNTER — Ambulatory Visit
Admission: RE | Admit: 2014-11-10 | Discharge: 2014-11-10 | Disposition: A | Discharge status: Home / Self Care | Payer: BLUE CROSS/BLUE SHIELD | Source: Ambulatory Visit | Attending: Family Medicine | Admitting: Family Medicine

## 2014-11-10 ENCOUNTER — Telehealth: Payer: Self-pay | Admitting: Family Medicine

## 2014-11-10 ENCOUNTER — Other Ambulatory Visit: Payer: Self-pay | Admitting: Family Medicine

## 2014-11-10 DIAGNOSIS — N6310 Unspecified lump in the right breast, unspecified quadrant: Secondary | ICD-10-CM

## 2014-11-10 DIAGNOSIS — M79601 Pain in right arm: Secondary | ICD-10-CM

## 2014-11-10 DIAGNOSIS — R2231 Localized swelling, mass and lump, right upper limb: Secondary | ICD-10-CM

## 2014-11-10 NOTE — Telephone Encounter (Signed)
Seth Bake, Will you please let patient know that the radiologist working on her case has requested a more comprehensive ultrasound to look for blood clots in her right arm.  An order has been placed, location can be changed if needed. Order in your in box.

## 2014-11-11 ENCOUNTER — Ambulatory Visit: Payer: BLUE CROSS/BLUE SHIELD

## 2014-11-11 NOTE — Telephone Encounter (Signed)
Left message on cell vm

## 2014-12-02 ENCOUNTER — Other Ambulatory Visit: Payer: Self-pay | Admitting: Family Medicine

## 2014-12-06 ENCOUNTER — Ambulatory Visit: Payer: BLUE CROSS/BLUE SHIELD

## 2014-12-19 ENCOUNTER — Ambulatory Visit: Payer: BLUE CROSS/BLUE SHIELD | Attending: Pain Medicine | Admitting: Pain Medicine

## 2014-12-19 ENCOUNTER — Other Ambulatory Visit: Payer: Self-pay | Admitting: Pain Medicine

## 2014-12-19 ENCOUNTER — Encounter: Payer: Self-pay | Admitting: Pain Medicine

## 2014-12-19 VITALS — BP 137/85 | HR 106 | Temp 98.6°F | Resp 18 | Ht 64.0 in | Wt 200.0 lb

## 2014-12-19 DIAGNOSIS — F1120 Opioid dependence, uncomplicated: Secondary | ICD-10-CM

## 2014-12-19 DIAGNOSIS — M8949 Other hypertrophic osteoarthropathy, multiple sites: Secondary | ICD-10-CM

## 2014-12-19 DIAGNOSIS — R252 Cramp and spasm: Secondary | ICD-10-CM

## 2014-12-19 DIAGNOSIS — Z5181 Encounter for therapeutic drug level monitoring: Secondary | ICD-10-CM

## 2014-12-19 DIAGNOSIS — F419 Anxiety disorder, unspecified: Secondary | ICD-10-CM | POA: Insufficient documentation

## 2014-12-19 DIAGNOSIS — Z969 Presence of functional implant, unspecified: Secondary | ICD-10-CM

## 2014-12-19 DIAGNOSIS — G8929 Other chronic pain: Secondary | ICD-10-CM

## 2014-12-19 DIAGNOSIS — M545 Low back pain, unspecified: Secondary | ICD-10-CM

## 2014-12-19 DIAGNOSIS — I10 Essential (primary) hypertension: Secondary | ICD-10-CM | POA: Diagnosis not present

## 2014-12-19 DIAGNOSIS — M1990 Unspecified osteoarthritis, unspecified site: Secondary | ICD-10-CM

## 2014-12-19 DIAGNOSIS — M25551 Pain in right hip: Secondary | ICD-10-CM | POA: Insufficient documentation

## 2014-12-19 DIAGNOSIS — Z79891 Long term (current) use of opiate analgesic: Secondary | ICD-10-CM

## 2014-12-19 DIAGNOSIS — E559 Vitamin D deficiency, unspecified: Secondary | ICD-10-CM

## 2014-12-19 DIAGNOSIS — Z79899 Other long term (current) drug therapy: Secondary | ICD-10-CM | POA: Diagnosis not present

## 2014-12-19 DIAGNOSIS — M542 Cervicalgia: Secondary | ICD-10-CM

## 2014-12-19 DIAGNOSIS — M5412 Radiculopathy, cervical region: Secondary | ICD-10-CM

## 2014-12-19 DIAGNOSIS — K589 Irritable bowel syndrome without diarrhea: Secondary | ICD-10-CM | POA: Insufficient documentation

## 2014-12-19 DIAGNOSIS — F1190 Opioid use, unspecified, uncomplicated: Secondary | ICD-10-CM

## 2014-12-19 DIAGNOSIS — M791 Myalgia: Secondary | ICD-10-CM

## 2014-12-19 DIAGNOSIS — F17200 Nicotine dependence, unspecified, uncomplicated: Secondary | ICD-10-CM

## 2014-12-19 DIAGNOSIS — M150 Primary generalized (osteo)arthritis: Secondary | ICD-10-CM

## 2014-12-19 DIAGNOSIS — M7918 Myalgia, other site: Secondary | ICD-10-CM

## 2014-12-19 DIAGNOSIS — Z459 Encounter for adjustment and management of unspecified implanted device: Secondary | ICD-10-CM

## 2014-12-19 DIAGNOSIS — M25559 Pain in unspecified hip: Secondary | ICD-10-CM

## 2014-12-19 DIAGNOSIS — M79609 Pain in unspecified limb: Secondary | ICD-10-CM

## 2014-12-19 DIAGNOSIS — M79603 Pain in arm, unspecified: Secondary | ICD-10-CM

## 2014-12-19 DIAGNOSIS — M79605 Pain in left leg: Secondary | ICD-10-CM | POA: Insufficient documentation

## 2014-12-19 DIAGNOSIS — M79606 Pain in leg, unspecified: Secondary | ICD-10-CM

## 2014-12-19 DIAGNOSIS — M5416 Radiculopathy, lumbar region: Secondary | ICD-10-CM

## 2014-12-19 DIAGNOSIS — M159 Polyosteoarthritis, unspecified: Secondary | ICD-10-CM

## 2014-12-19 HISTORY — DX: Opioid use, unspecified, uncomplicated: F11.90

## 2014-12-19 HISTORY — DX: Presence of functional implant, unspecified: Z96.9

## 2014-12-19 HISTORY — DX: Unspecified osteoarthritis, unspecified site: M19.90

## 2014-12-19 HISTORY — DX: Morbid (severe) obesity due to excess calories: E66.01

## 2014-12-19 HISTORY — DX: Long term (current) use of opiate analgesic: Z79.891

## 2014-12-19 HISTORY — DX: Opioid dependence, uncomplicated: F11.20

## 2014-12-19 HISTORY — DX: Low back pain, unspecified: M54.50

## 2014-12-19 HISTORY — DX: Encounter for adjustment and management of unspecified implanted device: Z45.9

## 2014-12-19 HISTORY — DX: Encounter for therapeutic drug level monitoring: Z51.81

## 2014-12-19 HISTORY — DX: Vitamin D deficiency, unspecified: E55.9

## 2014-12-19 HISTORY — DX: Cramp and spasm: R25.2

## 2014-12-19 HISTORY — DX: Other chronic pain: G89.29

## 2014-12-19 MED ORDER — OXYCODONE-ACETAMINOPHEN 5-325 MG PO TABS: 1.0000 | ORAL_TABLET | Freq: Three times a day (TID) | ORAL | Status: DC | PRN

## 2014-12-19 MED ORDER — CARISOPRODOL 350 MG PO TABS: 350.0000 mg | ORAL_TABLET | Freq: Three times a day (TID) | ORAL | Status: DC

## 2014-12-19 NOTE — Patient Instructions (Addendum)
Smoking Cessation, Tips for Success If you are ready to quit smoking, congratulations! You have chosen to help yourself be healthier. Cigarettes bring nicotine, tar, carbon monoxide, and other irritants into your body. Your lungs, heart, and blood vessels will be able to work better without these poisons. There are many different ways to quit smoking. Nicotine gum, nicotine patches, a nicotine inhaler, or nicotine nasal spray can help with physical craving. Hypnosis, support groups, and medicines help break the habit of smoking. WHAT THINGS CAN I DO TO MAKE QUITTING EASIER?  Here are some tips to help you quit for good: 1. Pick a date when you will quit smoking completely. Tell all of your friends and family about your plan to quit on that date. 2. Do not try to slowly cut down on the number of cigarettes you are smoking. Pick a quit date and quit smoking completely starting on that day. 3. Throw away all cigarettes.  4. Clean and remove all ashtrays from your home, work, and car. 5. On a card, write down your reasons for quitting. Carry the card with you and read it when you get the urge to smoke. 6. Cleanse your body of nicotine. Drink enough water and fluids to keep your urine clear or pale yellow. Do this after quitting to flush the nicotine from your body. 7. Learn to predict your moods. Do not let a bad situation be your excuse to have a cigarette. Some situations in your life might tempt you into wanting a cigarette. 8. Never have "just one" cigarette. It leads to wanting another and another. Remind yourself of your decision to quit. 9. Change habits associated with smoking. If you smoked while driving or when feeling stressed, try other activities to replace smoking. Stand up when drinking your coffee. Brush your teeth after eating. Sit in a different chair when you read the paper. Avoid alcohol while trying to quit, and try to drink fewer caffeinated beverages. Alcohol and caffeine may urge you  to smoke. 10. Avoid foods and drinks that can trigger a desire to smoke, such as sugary or spicy foods and alcohol. 11. Ask people who smoke not to smoke around you. 12. Have something planned to do right after eating or having a cup of coffee. For example, plan to take a walk or exercise. 13. Try a relaxation exercise to calm you down and decrease your stress. Remember, you may be tense and nervous for the first 2 weeks after you quit, but this will pass. 14. Find new activities to keep your hands busy. Play with a pen, coin, or rubber band. Doodle or draw things on paper. 15. Brush your teeth right after eating. This will help cut down on the craving for the taste of tobacco after meals. You can also try mouthwash.  16. Use oral substitutes in place of cigarettes. Try using lemon drops, carrots, cinnamon sticks, or chewing gum. Keep them handy so they are available when you have the urge to smoke. 17. When you have the urge to smoke, try deep breathing. 18. Designate your home as a nonsmoking area. 19. If you are a heavy smoker, ask your health care provider about a prescription for nicotine chewing gum. It can ease your withdrawal from nicotine. 20. Reward yourself. Set aside the cigarette money you save and buy yourself something nice. 21. Look for support from others. Join a support group or smoking cessation program. Ask someone at home or at work to help you with your plan   to quit smoking. 22. Always ask yourself, "Do I need this cigarette or is this just a reflex?" Tell yourself, "Today, I choose not to smoke," or "I do not want to smoke." You are reminding yourself of your decision to quit. 23. Do not replace cigarette smoking with electronic cigarettes (commonly called e-cigarettes). The safety of e-cigarettes is unknown, and some may contain harmful chemicals. 24. If you relapse, do not give up! Plan ahead and think about what you will do the next time you get the urge to smoke. HOW WILL  I FEEL WHEN I QUIT SMOKING? You may have symptoms of withdrawal because your body is used to nicotine (the addictive substance in cigarettes). You may crave cigarettes, be irritable, feel very hungry, cough often, get headaches, or have difficulty concentrating. The withdrawal symptoms are only temporary. They are strongest when you first quit but will go away within 10-14 days. When withdrawal symptoms occur, stay in control. Think about your reasons for quitting. Remind yourself that these are signs that your body is healing and getting used to being without cigarettes. Remember that withdrawal symptoms are easier to treat than the major diseases that smoking can cause.  Even after the withdrawal is over, expect periodic urges to smoke. However, these cravings are generally short lived and will go away whether you smoke or not. Do not smoke! WHAT RESOURCES ARE AVAILABLE TO HELP ME QUIT SMOKING? Your health care provider can direct you to community resources or hospitals for support, which may include: 1. Group support. 2. Education. 3. Hypnosis. 4. Therapy.   This information is not intended to replace advice given to you by your health care provider. Make sure you discuss any questions you have with your health care provider.   Document Released: 10/27/2003 Document Revised: 02/18/2014 Document Reviewed: 07/16/2012 Else[employer] Interactive Patient Education 2016 [employer] GENERAL RISKS AND COMPLICATIONS  What are the risk, side effects and possible complications? Generally speaking, most procedures are safe.  However, with any procedure there are risks, side effects, and the possibility of complications.  The risks and complications are dependent upon the sites that are lesioned, or the type of nerve block to be performed.  The closer the procedure is to the spine, the more serious the risks are.  Great care is taken when placing the radio frequency needles, block needles or lesioning probes,  but sometimes complications can occur. 1. Infection: Any time there is an injection through the skin, there is a risk of infection.  This is why sterile conditions are used for these blocks.  There are four possible types of infection. 1. Localized skin infection. 2. Central Nervous System Infection-This can be in the form of Meningitis, which can be deadly. 3. Epidural Infections-This can be in the form of an epidural abscess, which can cause pressure inside of the spine, causing compression of the spinal cord with subsequent paralysis. This would require an emergency surgery to decompress, and there are no guarantees that the patient would recover from the paralysis. 4. Discitis-This is an infection of the intervertebral discs.  It occurs in about 1% of discography procedures.  It is difficult to treat and it may lead to surgery.        2. Pain: the needles have to go through skin and soft tissues, will cause soreness.       3. Damage to internal structures:  The nerves to be lesioned may be near blood vessels or    other nerves which can   be potentially damaged.       4. Bleeding: Bleeding is more common if the patient is taking blood thinners such as  aspirin, Coumadin, Ticiid, Plavix, etc., or if he/she have some genetic predisposition  such as hemophilia. Bleeding into the spinal canal can cause compression of the spinal  cord with subsequent paralysis.  This would require an emergency surgery to  decompress and there are no guarantees that the patient would recover from the  paralysis.       5. Pneumothorax:  Puncturing of a lung is a possibility, every time a needle is introduced in  the area of the chest or upper back.  Pneumothorax refers to free air around the  collapsed lung(s), inside of the thoracic cavity (chest cavity).  Another two possible  complications related to a similar event would include: Hemothorax and Chylothorax.   These are variations of the Pneumothorax, where instead of air  around the collapsed  lung(s), you may have blood or chyle, respectively.       6. Spinal headaches: They may occur with any procedures in the area of the spine.       7. Persistent CSF (Cerebro-Spinal Fluid) leakage: This is a rare problem, but may occur  with prolonged intrathecal or epidural catheters either due to the formation of a fistulous  track or a dural tear.       8. Nerve damage: By working so close to the spinal cord, there is always a possibility of  nerve damage, which could be as serious as a permanent spinal cord injury with  paralysis.       9. Death:  Although rare, severe deadly allergic reactions known as "Anaphylactic  reaction" can occur to any of the medications used.      10. Worsening of the symptoms:  We can always make thing worse.  What are the chances of something like this happening? Chances of any of this occuring are extremely low.  By statistics, you have more of a chance of getting killed in a motor vehicle accident: while driving to the hospital than any of the above occurring .  Nevertheless, you should be aware that they are possibilities.  In general, it is similar to taking a shower.  Everybody knows that you can slip, hit your head and get killed.  Does that mean that you should not shower again?  Nevertheless always keep in mind that statistics do not mean anything if you happen to be on the wrong side of them.  Even if a procedure has a 1 (one) in a 1,000,000 (million) chance of going wrong, it you happen to be that one..Also, keep in mind that by statistics, you have more of a chance of having something go wrong when taking medications.  Who should not have this procedure? If you are on a blood thinning medication (e.g. Coumadin, Plavix, see list of "Blood Thinners"), or if you have an active infection going on, you should not have the procedure.  If you are taking any blood thinners, please inform your physician.  How should I prepare for this  procedure?  Do not eat or drink anything at least six hours prior to the procedure.  Bring a driver with you .  It cannot be a taxi.  Come accompanied by an adult that can drive you back, and that is strong enough to help you if your legs get weak or numb from the local anesthetic.  Take all of your medicines   the morning of the procedure with just enough water to swallow them.  If you have diabetes, make sure that you are scheduled to have your procedure done first thing in the morning, whenever possible.  If you have diabetes, take only half of your insulin dose and notify our nurse that you have done so as soon as you arrive at the clinic.  If you are diabetic, but only take blood sugar pills (oral hypoglycemic), then do not take them on the morning of your procedure.  You may take them after you have had the procedure.  Do not take aspirin or any aspirin-containing medications, at least eleven (11) days prior to the procedure.  They may prolong bleeding.  Wear loose fitting clothing that may be easy to take off and that you would not mind if it got stained with Betadine or blood.  Do not wear any jewelry or perfume  Remove any nail coloring.  It will interfere with some of our monitoring equipment.  NOTE: Remember that this is not meant to be interpreted as a complete list of all possible complications.  Unforeseen problems may occur.  BLOOD THINNERS The following drugs contain aspirin or other products, which can cause increased bleeding during surgery and should not be taken for 2 weeks prior to and 1 week after surgery.  If you should need take something for relief of minor pain, you may take acetaminophen which is found in Tylenol,m Datril, Anacin-3 and Panadol. It is not blood thinner. The products listed below are.  Do not take any of the products listed below in addition to any listed on your instruction sheet.  A.P.C or A.P.C with Codeine Codeine Phosphate Capsules #3  Ibuprofen Ridaura  ABC compound Congesprin Imuran rimadil  Advil Cope Indocin Robaxisal  Alka-Seltzer Effervescent Pain Reliever and Antacid Coricidin or Coricidin-D  Indomethacin Rufen  Alka-Seltzer plus Cold Medicine Cosprin Ketoprofen S-A-C Tablets  Anacin Analgesic Tablets or Capsules Coumadin Korlgesic Salflex  Anacin Extra Strength Analgesic tablets or capsules CP-2 Tablets Lanoril Salicylate  Anaprox Cuprimine Capsules Levenox Salocol  Anexsia-D Dalteparin Magan Salsalate  Anodynos Darvon compound Magnesium Salicylate Sine-off  Ansaid Dasin Capsules Magsal Sodium Salicylate  Anturane Depen Capsules Marnal Soma  APF Arthritis pain formula Dewitt's Pills Measurin Stanback  Argesic Dia-Gesic Meclofenamic Sulfinpyrazone  Arthritis Bayer Timed Release Aspirin Diclofenac Meclomen Sulindac  Arthritis pain formula Anacin Dicumarol Medipren Supac  Analgesic (Safety coated) Arthralgen Diffunasal Mefanamic Suprofen  Arthritis Strength Bufferin Dihydrocodeine Mepro Compound Suprol  Arthropan liquid Dopirydamole Methcarbomol with Aspirin Synalgos  ASA tablets/Enseals Disalcid Micrainin Tagament  Ascriptin Doan's Midol Talwin  Ascriptin A/D Dolene Mobidin Tanderil  Ascriptin Extra Strength Dolobid Moblgesic Ticlid  Ascriptin with Codeine Doloprin or Doloprin with Codeine Momentum Tolectin  Asperbuf Duoprin Mono-gesic Trendar  Aspergum Duradyne Motrin or Motrin IB Triminicin  Aspirin plain, buffered or enteric coated Durasal Myochrisine Trigesic  Aspirin Suppositories Easprin Nalfon Trillsate  Aspirin with Codeine Ecotrin Regular or Extra Strength Naprosyn Uracel  Atromid-S Efficin Naproxen Ursinus  Auranofin Capsules Elmiron Neocylate Vanquish  Axotal Emagrin Norgesic Verin  Azathioprine Empirin or Empirin with Codeine Normiflo Vitamin E  Azolid Emprazil Nuprin Voltaren  Bayer Aspirin plain, buffered or children's or timed BC Tablets or powders Encaprin Orgaran Warfarin Sodium   Buff-a-Comp Enoxaparin Orudis Zorpin  Buff-a-Comp with Codeine Equegesic Os-Cal-Gesic   Buffaprin Excedrin plain, buffered or Extra Strength Oxalid   Bufferin Arthritis Strength Feldene Oxphenbutazone   Bufferin plain or Extra Strength Feldene Capsules Oxycodone with Aspirin     Bufferin with Codeine Fenoprofen Fenoprofen Pabalate or Pabalate-SF   Buffets II Flogesic Panagesic   Buffinol plain or Extra Strength Florinal or Florinal with Codeine Panwarfarin   Buf-Tabs Flurbiprofen Penicillamine   Butalbital Compound Four-way cold tablets Penicillin   Butazolidin Fragmin Pepto-Bismol   Carbenicillin Geminisyn Percodan   Carna Arthritis Reliever Geopen Persantine   Carprofen Gold's salt Persistin   Chloramphenicol Goody's Phenylbutazone   Chloromycetin Haltrain Piroxlcam   Clmetidine heparin Plaquenil   Cllnoril Hyco-pap Ponstel   Clofibrate Hydroxy chloroquine Propoxyphen         Before stopping any of these medications, be sure to consult the physician who ordered them.  Some, such as Coumadin (Warfarin) are ordered to prevent or treat serious conditions such as "deep thrombosis", "pumonary embolisms", and other heart problems.  The amount of time that you may need off of the medication may also vary with the medication and the reason for which you were taking it.  If you are taking any of these medications, please make sure you notify your pain physician before you undergo any procedures.         Epidural Steroid Injection Patient Information  Description: The epidural space surrounds the nerves as they exit the spinal cord.  In some patients, the nerves can be compressed and inflamed by a bulging disc or a tight spinal canal (spinal stenosis).  By injecting steroids into the epidural space, we can bring irritated nerves into direct contact with a potentially helpful medication.  These steroids act directly on the irritated nerves and can reduce swelling and inflammation which often  leads to decreased pain.  Epidural steroids may be injected anywhere along the spine and from the neck to the low back depending upon the location of your pain.   After numbing the skin with local anesthetic (like Novocaine), a small needle is passed into the epidural space slowly.  You may experience a sensation of pressure while this is being done.  The entire block usually last less than 10 minutes.  Conditions which may be treated by epidural steroids:   Low back and leg pain  Neck and arm pain  Spinal stenosis  Post-laminectomy syndrome  Herpes zoster (shingles) pain  Pain from compression fractures  Preparation for the injection:  5. Do not eat any solid food or dairy products within 6 hours of your appointment.  6. You may drink clear liquids up to 2 hours before appointment.  Clear liquids include water, black coffee, juice or soda.  No milk or cream please. 7. You may take your regular medication, including pain medications, with a sip of water before your appointment  Diabetics should hold regular insulin (if taken separately) and take 1/2 normal NPH dos the morning of the procedure.  Carry some sugar containing items with you to your appointment. 8. A driver must accompany you and be prepared to drive you home after your procedure.  9. Bring all your current medications with your. 10. An IV may be inserted and sedation may be given at the discretion of the physician.   11. A blood pressure cuff, EKG and other monitors will often be applied during the procedure.  Some patients may need to have extra oxygen administered for a short period. 12. You will be asked to provide medical information, including your allergies, prior to the procedure.  We must know immediately if you are taking blood thinners (like Coumadin/Warfarin)  Or if you are allergic to IV iodine contrast (dye). We must   know if you could possible be pregnant.  Possible side-effects:  Bleeding from needle  site  Infection (rare, may require surgery)  Nerve injury (rare)  Numbness & tingling (temporary)  Difficulty urinating (rare, temporary)  Spinal headache ( a headache worse with upright posture)  Light -headedness (temporary)  Pain at injection site (several days)  Decreased blood pressure (temporary)  Weakness in arm/leg (temporary)  Pressure sensation in back/neck (temporary)  Call if you experience:  Fever/chills associated with headache or increased back/neck pain.  Headache worsened by an upright position.  New onset weakness or numbness of an extremity below the injection site  Hives or difficulty breathing (go to the emergency room)  Inflammation or drainage at the infection site  Severe back/neck pain  Any new symptoms which are concerning to you  Please note:  Although the local anesthetic injected can often make your back or neck feel good for several hours after the injection, the pain will likely return.  It takes 3-7 days for steroids to work in the epidural space.  You may not notice any pain relief for at least that one week.  If effective, we will often do a series of three injections spaced 3-6 weeks apart to maximally decrease your pain.  After the initial series, we generally will wait several months before considering a repeat injection of the same type.  If you have any questions, please call [phone] [facility] 

## 2014-12-19 NOTE — Progress Notes (Signed)
Patient's Name: Mary Cruz MRN: [id number] DOB: 04/24/1975 DOS: 12/19/2014  Primary Reason(s) for Visit: Encounter for Medication Management. CC: Neck Pain; Hip Pain; and Back Pain   HPI:   Mary Cruz is a 39 y.o. year old, female patient, who returns today as an established patient. 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 Her primarily concern today is the Neck Pain; Hip Pain; and Back Pain    The patient's primary pain is in the lower back with the right being worst on the left. In addition, she has bilateral lower extremity pain which goes all the way down to her feet. On the right sided follows an L5 dermatomal distribution. On the left sided follows an S1 dermatomal distribution. In addition, she has bilateral neck pain and bilateral upper extremity pain with the left being worse than the right. The pain goes all the way into her hands. In terms of her low back pain and her leg pain, she indicates that the neck pain is worse today. She came in to the clinic today to have her spinal cord similar adjusted. This was done without any  complications. She can now experienced more stimulation over her hips, which is what she was looking for. However, it is not covering the neck pain as it is in the lumbar region. This is because the implant is in the lumbar region and not in the cervical. Her implant was put in in 2009 and it is not MRI compatible. There is still some battery life left. As soon as the battery gets the plegic, we will going and consider exchanging the system for an MRI compatible one. However, before doing that and after having taken the old system, we will need to do an MRI of her cervical and lumbar regions to determine the progression of her disease at those 2 levels. Today's Pain Score: 5  Reported level of pain is incompatible with clinical obrservations. This may be secondary to a possible lack of understanding on how the pain scale works. Pain Type: Chronic pain Pain Location: Neck (back, knee) Pain Orientation: Lower (low back, mid neck right knee and right hip) Pain Descriptors / Indicators: Tingling, Numbness, Sharp, Aching, Burning Pain Frequency: Constant  Date of Last Visit: Date of Last Visit: 09/29/14 Service Provided on Last Visit: Service Provided on Last Visit: Med Refill  Pharmacotherapy Review:   Side-effects or Adverse reactions: None reported. Effectiveness: Described as relatively effective, allowing for increase in activities of daily living (ADL). Onset of action: Within expected pharmacological parameters. Duration of action: Within normal limits for medication. Peak effect: Timing and results are as within normal expected parameters. [place] PMP: Compliant with practice rules and regulations. DST: Compliant with practice rules and regulations. Lab work: No new labs ordered by our practice. Treatment compliance: Compliant. Substance Use Disorder (SUD) Risk Level: Low  Planned course of action: Continue therapy as is.  Allergies: Mary Cruz is allergic to amoxicillin; bactrim; celebrex;  doxycycline; erythromycin; hydromorphone hcl; lyrica; nabumetone; and pentazocine lactate.  Meds: The patient has a current medication list which includes the following prescription(s): amitriptyline, carisoprodol, clonazepam, escitalopram, furosemide, lisinopril-hydrochlorothiazide, omeprazole, oxycodone-acetaminophen, vitamin d (ergocalciferol), oxycodone-acetaminophen, oxycodone-acetaminophen, and oxycodone-acetaminophen. Requested Prescriptions   Signed Prescriptions Disp Refills  . carisoprodol (SOMA) 350 MG tablet 90 tablet 2    Sig: Take 1 tablet (350 mg total) by mouth 3 (three) times daily.  [name] oxyCODONE-acetaminophen (PERCOCET/ROXICET) 5-325 MG tablet 90 tablet 0    Sig: Take 1 tablet by mouth every 8 (eight) hours as needed for severe pain.  [name] oxyCODONE-acetaminophen (PERCOCET/ROXICET) 5-325 MG tablet 90 tablet 0    Sig: Take 1 tablet by mouth every 8 (eight) hours as needed for severe pain.  [name] oxyCODONE-acetaminophen (PERCOCET/ROXICET) 5-325 MG tablet 90 tablet 0    Sig: Take 1 tablet by mouth every 8 (eight) hours as needed for severe pain.    ROS: Constitutional: Afebrile, no chills, well hydrated and well nourished Gastrointestinal: negative Musculoskeletal:negative Neurological: negative Behavioral/Psych: negative  PFSH: Medical:  Mary Cruz  has a past medical history of Osteoarthritis; Hypertension; Pain; and Fibromyalgia. Family: family history includes Asthma in her mother; Depression in her mother; Fibromyalgia in an other family member; Stroke in an other family member; Thyroid disease in an other family member. Surgical:  has past surgical history that includes spinal cord stimulator; Occipital nerve stimulator insertion; Cholecystectomy (2000); Knee surgery; Shoulder surgery; Tubal ligation (2000); Endometrial ablation; and Spinal cord stimulator implant (2008). Tobacco:  reports that she has been smoking.  She does not have any smokeless tobacco history on  file. Alcohol:  reports that she does not drink alcohol. Drug:  reports that she does not use illicit drugs.  Physical Exam: Vitals:  Today's Vitals   12/19/14 1416 12/19/14 1418  BP: 137/85   Pulse: 106   Temp: 98.6 F (37 C)   Resp: 18   Height: 5\' 4"  (1.626 m)   Weight: 200 lb (90.719 kg)   SpO2: 100%   PainSc: 5  5   PainLoc: Neck   Calculated BMI: Body mass index is 34.31 kg/(m^2). General appearance: alert, cooperative, appears stated age, mild distress and morbidly obese Eyes: conjunctivae/corneas clear. PERRL, EOM's intact. Fundi benign. Lungs: No evidence respiratory distress, no audible rales or ronchi and no use of accessory muscles of respiration Neck: no adenopathy, no carotid bruit, no JVD, supple, symmetrical, trachea midline and thyroid not enlarged, symmetric, no tenderness/mass/nodules Back: symmetric, no curvature. ROM normal. No CVA tenderness. Extremities: extremities normal, atraumatic, no cyanosis or edema Pulses: 2+ and symmetric Skin: Skin color, texture, turgor normal. No rashes or lesions Neurologic: Gait: Antalgic    Assessment: Encounter Diagnosis:  Primary Diagnosis: Chronic pain [G89.29]  Plan: Mary Cruz was seen today for neck pain, hip pain and back pain.  Diagnoses and all orders for this visit:  Chronic pain -     COMPLETE METABOLIC PANEL WITH GFR; Future -     C-reactive protein; Future -     Magnesium; Future -     Sedimentation rate; Future -     Vitamin D2,D3 Panel; Future -     oxyCODONE-acetaminophen (PERCOCET/ROXICET) 5-325 MG tablet; Take 1 tablet by mouth every 8 (eight) hours as needed for severe pain. -     oxyCODONE-acetaminophen (PERCOCET/ROXICET) 5-325 MG tablet; Take 1 tablet by mouth every 8 (eight) hours as needed  for severe pain. -     oxyCODONE-acetaminophen (PERCOCET/ROXICET) 5-325 MG tablet; Take 1 tablet by mouth every 8 (eight) hours as needed for severe pain.  Long term current use of opiate analgesic -      Drugs of abuse screen w/o alc, rtn urine-sln; Future  Long term prescription opiate use  Opiate use  Uncomplicated opioid dependence ([place])  Encounter for therapeutic drug level monitoring  Musculoskeletal pain  Muscle cramping -     carisoprodol (SOMA) 350 MG tablet; Take 1 tablet (350 mg total) by mouth 3 (three) times daily.  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 You have chosen to help yourself be healthier. Cigarettes bring nicotine, tar, carbon monoxide, and other irritants into your body. Your lungs, heart, and blood vessels will be able to work better without these poisons. There are many different ways to quit smoking. Nicotine gum, nicotine patches, a nicotine inhaler, or nicotine nasal spray can help with physical craving. Hypnosis, support groups, and medicines help break the habit of smoking. WHAT THINGS CAN I DO TO MAKE QUITTING EASIER?  Here are some tips to help you quit for good: 1. Pick a date when you will quit smoking completely. Tell all of your friends and family about your plan to quit on that date. 2. Do not try to slowly cut down on the number of cigarettes you are smoking. Pick a quit date and quit smoking completely starting on that day. 3. Throw away  all cigarettes.  4. Clean and remove all ashtrays from your home, work, and car. 5. On a card, write down your reasons for quitting. Carry the card with you and read it when you get the urge to smoke. 6. Cleanse your body of nicotine. Drink enough water and fluids to keep your urine clear or pale yellow. Do this after quitting to flush the nicotine from your body. 7. Learn to predict your moods. Do not let a bad situation be your excuse to have a cigarette. Some situations in your life might tempt you into wanting a cigarette. 8. Never have "just one" cigarette. It leads to wanting another and another. Remind yourself of your decision to quit. 9. Change habits associated with smoking. If you smoked while driving or when feeling stressed, try other activities to replace smoking. Stand up when drinking your coffee. Brush your teeth after eating. Sit in a different chair when you read the paper. Avoid alcohol while trying to quit, and try to drink fewer caffeinated beverages. Alcohol and caffeine may urge you to smoke. 10. Avoid foods and drinks that can trigger a desire to smoke, such as sugary or spicy foods and alcohol. 11. Ask people who smoke not to smoke around you. 12. Have something planned to do right after eating or having a cup of coffee. For example, plan to take a walk or exercise. 13. Try a  relaxation exercise to calm you down and decrease your stress. Remember, you may be tense and nervous for the first 2 weeks after you quit, but this will pass. 92. Find new activities to keep your hands busy. Play with a pen, coin, or rubber band. Doodle or draw things on paper. 15. Brush your teeth right after eating. This will help cut down on the craving for the taste of tobacco after meals. You can also try mouthwash.  16. Use oral substitutes in place of cigarettes. Try using lemon drops, carrots, cinnamon sticks, or chewing gum. Keep them handy so they are available when you have the urge to  smoke. 17. When you have the urge to smoke, try deep breathing. 33. Designate your home as a nonsmoking area. 19. If you are a heavy smoker, ask your health care provider about a prescription for nicotine chewing gum. It can ease your withdrawal from nicotine. 20. Reward yourself. Set aside the cigarette money you save and buy yourself something nice. 21. Look for support from others. Join a support group or smoking cessation program. Ask someone at home or at work to help you with your plan to quit smoking. 22. Always ask yourself, "Do I need this cigarette or is this just a reflex?" Tell yourself, "Today, I choose not to smoke," or "I do not want to smoke." You are reminding yourself of your decision to quit. 23. Do not replace cigarette smoking with electronic cigarettes (commonly called e-cigarettes). The safety of e-cigarettes is unknown, and some may contain harmful chemicals. 24. If you relapse, do not give up! Plan ahead and think about what you will do the next time you get the urge to smoke. HOW WILL I FEEL WHEN I QUIT SMOKING? You may have symptoms of withdrawal because your body is used to nicotine (the addictive substance in cigarettes). You may crave cigarettes, be irritable, feel very hungry, cough often, get headaches, or have difficulty concentrating. The withdrawal symptoms are only temporary. They are strongest when you first quit but will go away within 10-14 days. When withdrawal symptoms occur, stay in control. Think about your reasons for quitting. Remind yourself that these are signs that your body is healing and getting used to being without cigarettes. Remember that withdrawal symptoms are easier to treat than the major diseases that smoking can cause.  Even after the withdrawal is over, expect periodic urges to smoke. However, these cravings are generally short lived and will go away whether you smoke or not. Do not smoke! WHAT RESOURCES ARE AVAILABLE TO HELP ME QUIT  SMOKING? Your health care provider can direct you to community resources or hospitals for support, which may include: 1. Group support. 2. Education. 3. Hypnosis. 4. Therapy.   This information is not intended to replace advice given to you by your health care provider. Make sure you discuss any questions you have with your health care provider.   Document Released: 10/27/2003 Document Revised: 02/18/2014 Document Reviewed: 07/16/2012 Elsevier Interactive Patient Education 2016 [place]  What are the risk, side effects and possible complications? Generally speaking, most procedures are safe.  However, with any procedure there are risks, side effects, and the possibility of complications.  The risks and complications are dependent upon the sites that are lesioned, or the type of nerve block to be performed.  The closer the procedure is to the spine, the more serious the risks are.  Great care is taken when placing the radio frequency  needles, block needles or lesioning probes, but sometimes complications can occur. 1. Infection: Any time there is an injection through the skin, there is a risk of infection.  This is why sterile conditions are used for these blocks.  There are four possible types of infection. 1. Localized skin infection. 2. Central Nervous System Infection-This can be in the form of Meningitis, which can be deadly. 3. Epidural Infections-This can be in the form of an epidural abscess, which can cause pressure inside of the spine, causing compression of the spinal cord with subsequent paralysis. This would require an emergency surgery to decompress, and there are no guarantees that the patient would recover from the paralysis. 4. Discitis-This is an infection of the intervertebral discs.  It occurs in about 1% of discography procedures.  It is difficult to treat and it may lead to surgery.        2. Pain: the needles have to go through skin and  soft tissues, will cause soreness.       3. Damage to internal structures:  The nerves to be lesioned may be near blood vessels or    other nerves which can be potentially damaged.       4. Bleeding: Bleeding is more common if the patient is taking blood thinners such as  aspirin, Coumadin, Ticiid, Plavix, etc., or if he/she have some genetic predisposition  such as hemophilia. Bleeding into the spinal canal can cause compression of the spinal  cord with subsequent paralysis.  This would require an emergency surgery to  decompress and there are no guarantees that the patient would recover from the  paralysis.       5. Pneumothorax:  Puncturing of a lung is a possibility, every time a needle is introduced in  the area of the chest or upper back.  Pneumothorax refers to free air around the  collapsed lung(s), inside of the thoracic cavity (chest cavity).  Another two possible  complications related to a similar event would include: Hemothorax and Chylothorax.   These are variations of the Pneumothorax, where instead of air around the collapsed  lung(s), you may have blood or chyle, respectively.       6. Spinal headaches: They may occur with any procedures in the area of the spine.       7. Persistent CSF (Cerebro-Spinal Fluid) leakage: This is a rare problem, but may occur  with prolonged intrathecal or epidural catheters either due to the formation of a fistulous  track or a dural tear.       8. Nerve damage: By working so close to the spinal cord, there is always a possibility of  nerve damage, which could be as serious as a permanent spinal cord injury with  paralysis.       9. Death:  Although rare, severe deadly allergic reactions known as "Anaphylactic  reaction" can occur to any of the medications used.      10. Worsening of the symptoms:  We can always make thing worse.  What are the chances of something like this happening? Chances of any of this occuring are extremely low.  By statistics, you  have more of a chance of getting killed in a motor vehicle accident: while driving to the hospital than any of the above occurring .  Nevertheless, you should be aware that they are possibilities.  In general, it is similar to taking a shower.  Everybody knows that you can slip, hit your head and get killed.  Does that mean that you should not shower again?  Nevertheless always keep in mind that statistics do not mean anything if you happen to be on the wrong side of them.  Even if a procedure has a 1 (one) in a 1,000,000 (million) chance of going wrong, it you happen to be that one..Also, keep in mind that by statistics, you have more of a chance of having something go wrong when taking medications.  Who should not have this procedure? If you are on a blood thinning medication (e.g. Coumadin, Plavix, see list of "Blood Thinners"), or if you have an active infection going on, you should not have the procedure.  If you are taking any blood thinners, please inform your physician.  How should I prepare for this procedure?  Do not eat or drink anything at least six hours prior to the procedure.  Bring a driver with you .  It cannot be a taxi.  Come accompanied by an adult that can drive you back, and that is strong enough to help you if your legs get weak or numb from the local anesthetic.  Take all of your medicines the morning of the procedure with just enough water to swallow them.  If you have diabetes, make sure that you are scheduled to have your procedure done first thing in the morning, whenever possible.  If you have diabetes, take only half of your insulin dose and notify our nurse that you have done so as soon as you arrive at the clinic.  If you are diabetic, but only take blood sugar pills (oral hypoglycemic), then do not take them on the morning of your procedure.  You may take them after you have had the procedure.  Do not take aspirin or any aspirin-containing medications, at least  eleven (11) days prior to the procedure.  They may prolong bleeding.  Wear loose fitting clothing that may be easy to take off and that you would not mind if it got stained with Betadine or blood.  Do not wear any jewelry or perfume  Remove any nail coloring.  It will interfere with some of our monitoring equipment.  NOTE: Remember that this is not meant to be interpreted as a complete list of all possible complications.  Unforeseen problems may occur.  BLOOD THINNERS The following drugs contain aspirin or other products, which can cause increased bleeding during surgery and should not be taken for 2 weeks prior to and 1 week after surgery.  If you should need take something for relief of minor pain, you may take acetaminophen which is found in Tylenol,m Datril, Anacin-3 and Panadol. It is not blood thinner. The products listed below are.  Do not take any of the products listed below in addition to any listed on your instruction sheet.  A.P.C or A.P.C with Codeine Codeine Phosphate Capsules #3 Ibuprofen Ridaura  ABC compound Congesprin Imuran rimadil  Advil Cope Indocin Robaxisal  Alka-Seltzer Effervescent Pain Reliever and Antacid Coricidin or Coricidin-D  Indomethacin Rufen  Alka-Seltzer plus Cold Medicine Cosprin Ketoprofen S-A-C Tablets  Anacin Analgesic Tablets or Capsules Coumadin Korlgesic Salflex  Anacin Extra Strength Analgesic tablets or capsules CP-2 Tablets Lanoril Salicylate  Anaprox Cuprimine Capsules Levenox Salocol  Anexsia-D Dalteparin Magan Salsalate  Anodynos Darvon compound Magnesium Salicylate Sine-off  Ansaid Dasin Capsules Magsal Sodium Salicylate  Anturane Depen Capsules Marnal Soma  APF Arthritis pain formula Dewitt's Pills Measurin Stanback  Argesic Dia-Gesic Meclofenamic Sulfinpyrazone  Arthritis Bayer Timed Release Aspirin Diclofenac Meclomen  Sulindac  Arthritis pain formula Anacin Dicumarol Medipren Supac  Analgesic (Safety coated) Arthralgen Diffunasal  Mefanamic Suprofen  Arthritis Strength Bufferin Dihydrocodeine Mepro Compound Suprol  Arthropan liquid Dopirydamole Methcarbomol with Aspirin Synalgos  ASA tablets/Enseals Disalcid Micrainin Tagament  Ascriptin Doan's Midol Talwin  Ascriptin A/D Dolene Mobidin Tanderil  Ascriptin Extra Strength Dolobid Moblgesic Ticlid  Ascriptin with Codeine Doloprin or Doloprin with Codeine Momentum Tolectin  Asperbuf Duoprin Mono-gesic Trendar  Aspergum Duradyne Motrin or Motrin IB Triminicin  Aspirin plain, buffered or enteric coated Durasal Myochrisine Trigesic  Aspirin Suppositories Easprin Nalfon Trillsate  Aspirin with Codeine Ecotrin Regular or Extra Strength Naprosyn Uracel  Atromid-S Efficin Naproxen Ursinus  Auranofin Capsules Elmiron Neocylate Vanquish  Axotal Emagrin Norgesic Verin  Azathioprine Empirin or Empirin with Codeine Normiflo Vitamin E  Azolid Emprazil Nuprin Voltaren  Bayer Aspirin plain, buffered or children's or timed BC Tablets or powders Encaprin Orgaran Warfarin Sodium  Buff-a-Comp Enoxaparin Orudis Zorpin  Buff-a-Comp with Codeine Equegesic Os-Cal-Gesic   Buffaprin Excedrin plain, buffered or Extra Strength Oxalid   Bufferin Arthritis Strength Feldene Oxphenbutazone   Bufferin plain or Extra Strength Feldene Capsules Oxycodone with Aspirin   Bufferin with Codeine Fenoprofen Fenoprofen Pabalate or Pabalate-SF   Buffets II Flogesic Panagesic   Buffinol plain or Extra Strength Florinal or Florinal with Codeine Panwarfarin   Buf-Tabs Flurbiprofen Penicillamine   Butalbital Compound Four-way cold tablets Penicillin   Butazolidin Fragmin Pepto-Bismol   Carbenicillin Geminisyn Percodan   Carna Arthritis Reliever Geopen Persantine   Carprofen Gold's salt Persistin   Chloramphenicol Goody's Phenylbutazone   Chloromycetin Haltrain Piroxlcam   Clmetidine heparin Plaquenil   Cllnoril Hyco-pap Ponstel   Clofibrate Hydroxy chloroquine Propoxyphen         Before stopping any of  these medications, be sure to consult the physician who ordered them.  Some, such as Coumadin (Warfarin) are ordered to prevent or treat serious conditions such as "deep thrombosis", "pumonary embolisms", and other heart problems.  The amount of time that you may need off of the medication may also vary with the medication and the reason for which you were taking it.  If you are taking any of these medications, please make sure you notify your pain physician before you undergo any procedures.         Epidural Steroid Injection Patient Information  Description: The epidural space surrounds the nerves as they exit the spinal cord.  In some patients, the nerves can be compressed and inflamed by a bulging disc or a tight spinal canal (spinal stenosis).  By injecting steroids into the epidural space, we can bring irritated nerves into direct contact with a potentially helpful medication.  These steroids act directly on the irritated nerves and can reduce swelling and inflammation which often leads to decreased pain.  Epidural steroids may be injected anywhere along the spine and from the neck to the low back depending upon the location of your pain.   After numbing the skin with local anesthetic (like Novocaine), a small needle is passed into the epidural space slowly.  You may experience a sensation of pressure while this is being done.  The entire block usually last less than 10 minutes.  Conditions which may be treated by epidural steroids:   Low back and leg pain  Neck and arm pain  Spinal stenosis  Post-laminectomy syndrome  Herpes zoster (shingles) pain  Pain from compression fractures  Preparation for the injection:  5. Do not eat any solid food or dairy products within  6 hours of your appointment.  6. You may drink clear liquids up to 2 hours before appointment.  Clear liquids include water, black coffee, juice or soda.  No milk or cream please. 7. You may take your regular  medication, including pain medications, with a sip of water before your appointment  Diabetics should hold regular insulin (if taken separately) and take 1/2 normal NPH dos the morning of the procedure.  Carry some sugar containing items with you to your appointment. 8. A driver must accompany you and be prepared to drive you home after your procedure.  9. Bring all your current medications with your. 10. An IV may be inserted and sedation may be given at the discretion of the physician.   11. A blood pressure cuff, EKG and other monitors will often be applied during the procedure.  Some patients may need to have extra oxygen administered for a short period. 12. You will be asked to provide medical information, including your allergies, prior to the procedure.  We must know immediately if you are taking blood thinners (like Coumadin/Warfarin)  Or if you are allergic to IV iodine contrast (dye). We must know if you could possible be pregnant.  Possible side-effects:  Bleeding from needle site  Infection (rare, may require surgery)  Nerve injury (rare)  Numbness & tingling (temporary)  Difficulty urinating (rare, temporary)  Spinal headache ( a headache worse with upright posture)  Light -headedness (temporary)  Pain at injection site (several days)  Decreased blood pressure (temporary)  Weakness in arm/leg (temporary)  Pressure sensation in back/neck (temporary)  Call if you experience:  Fever/chills associated with headache or increased back/neck pain.  Headache worsened by an upright position.  New onset weakness or numbness of an extremity below the injection site  Hives or difficulty breathing (go to the emergency room)  Inflammation or drainage at the infection site  Severe back/neck pain  Any new symptoms which are concerning to you  Please note:  Although the local anesthetic injected can often make your back or neck feel good for several hours after the  injection, the pain will likely return.  It takes 3-7 days for steroids to work in the epidural space.  You may not notice any pain relief for at least that one week.  If effective, we will often do a series of three injections spaced 3-6 weeks apart to maximally decrease your pain.  After the initial series, we generally will wait several months before considering a repeat injection of the same type.  If you have any questions, please call [phone] [facility]   Medications discontinued today:  Medications Discontinued During This Encounter  Medication Reason  . fluconazole (DIFLUCAN) 150 MG tablet Error  . penicillin v potassium (VEETID) 500 MG tablet Error  . phentermine (ADIPEX-P) 37.5 MG tablet Error  . carisoprodol (SOMA) 350 MG tablet Reorder   Medications administered today:  Mary Cruz had no medications administered during this visit.  Primary Care Physician: Marcial Pacas, DO Location: [facility] Note by: Kathlen Brunswick. Dossie Arbour, M.D, DABA, DABAPM, DABPM, DABIPP, FIPP

## 2014-12-19 NOTE — Progress Notes (Signed)
Safety precautions to be maintained throughout the outpatient stay will include: orient to surroundings, keep bed in low position, maintain call bell within reach at all times, provide assistance with transfer out of bed and ambulation. Percocet pill count # 42/90

## 2014-12-27 LAB — TOXASSURE SELECT 13 (MW), URINE: PDF: 0

## 2015-01-10 ENCOUNTER — Ambulatory Visit (INDEPENDENT_AMBULATORY_CARE_PROVIDER_SITE_OTHER): Payer: BLUE CROSS/BLUE SHIELD | Admitting: Sports Medicine

## 2015-01-10 ENCOUNTER — Encounter: Payer: Self-pay | Admitting: Sports Medicine

## 2015-01-10 VITALS — BP 126/83 | HR 98 | Wt 235.0 lb

## 2015-01-10 DIAGNOSIS — M25561 Pain in right knee: Secondary | ICD-10-CM | POA: Diagnosis not present

## 2015-01-10 HISTORY — DX: Pain in right knee: M25.561

## 2015-01-10 NOTE — Assessment & Plan Note (Signed)
Pain and swelling with predominate pain at the medial joint line suggestive of a medial meniscal injury. We will give her a hinged knee brace, aspiration and injection as above, x-rays, return to see me in one month, formal physical therapy.

## 2015-01-10 NOTE — Progress Notes (Signed)
   Subjective:    I'm seeing this patient as a consultation for:  Dr. Marcial Pacas  CC: Right knee pain  HPI: For several days this pleasant 39 year old female has had severe pain that she localizes along the medial joint line of her right knee with swelling. Pain is persistent without radiation. She is unable to fully straighten her knee.  Past medical history, Surgical history, Family history not pertinant except as noted below, Social history, Allergies, and medications have been entered into the medical record, reviewed, and no changes needed.   Review of Systems: No headache, visual changes, nausea, vomiting, diarrhea, constipation, dizziness, abdominal pain, skin rash, fevers, chills, night sweats, weight loss, swollen lymph nodes, body aches, joint swelling, muscle aches, chest pain, shortness of breath, mood changes, visual or auditory hallucinations.   Objective:   General: Well Developed, well nourished, and in no acute distress.  Neuro/Psych: Alert and oriented x3, extra-ocular muscles intact, able to move all 4 extremities, sensation grossly intact. Skin: Warm and dry, no rashes noted.  Respiratory: Not using accessory muscles, speaking in full sentences, trachea midline.  Cardiovascular: Pulses palpable, no extremity edema. Abdomen: Does not appear distended. Right Knee: Visibly swollen with a palpable fluid wave and tenderness at the medial joint line. ROM normal in flexion and extension and lower leg rotation. Ligaments with solid consistent endpoints including ACL, PCL, LCL, MCL. Negative Mcmurray's and provocative meniscal tests. Non painful patellar compression. Patellar and quadriceps tendons unremarkable. Hamstring and quadriceps strength is normal.  Procedure: Real-time Ultrasound Guided aspiration/Injection of right knee Device: GE Logiq E  Verbal informed consent obtained.  Time-out conducted.  Noted no overlying erythema, induration, or other signs of local  infection.  Skin prepped in a sterile fashion.  Local anesthesia: Topical Ethyl chloride.  With sterile technique and under real time ultrasound guidance:  5 mL of straw-colored fluid aspirated, syringe switched and 2 mL kenalog 40, 2 mL lidocaine, 2 mL Marcaine injected easily. Completed without difficulty  Pain immediately resolved suggesting accurate placement of the medication.  Advised to call if fevers/chills, erythema, induration, drainage, or persistent bleeding.  Images permanently stored and available for review in the ultrasound unit.  Impression: Technically successful ultrasound guided injection.  Impression and Recommendations:   This case required medical decision making of moderate complexity.

## 2015-01-11 ENCOUNTER — Other Ambulatory Visit: Payer: Self-pay | Admitting: Family Medicine

## 2015-01-17 ENCOUNTER — Ambulatory Visit: Payer: BLUE CROSS/BLUE SHIELD | Admitting: Family Medicine

## 2015-01-17 ENCOUNTER — Other Ambulatory Visit: Payer: Self-pay | Admitting: Pain Medicine

## 2015-01-18 ENCOUNTER — Ambulatory Visit: Payer: BLUE CROSS/BLUE SHIELD | Attending: Sports Medicine | Admitting: Physical Therapy

## 2015-01-18 DIAGNOSIS — M25561 Pain in right knee: Secondary | ICD-10-CM | POA: Diagnosis present

## 2015-01-18 DIAGNOSIS — M24661 Ankylosis, right knee: Secondary | ICD-10-CM | POA: Insufficient documentation

## 2015-01-18 NOTE — Therapy (Signed)
[facility] [place], [place], [address] Phone: [phone]   Fax:  [phone]  Physical Therapy Evaluation  Patient Details  Name: Mary Cruz MRN: [id number] Date of Birth: 07/14/1975 Referring Provider: Aundria Mems MD  Encounter Date: 01/18/2015    Past Medical History  Diagnosis Date  . Osteoarthritis   . Hypertension   . Pain     chronic regional pain syndrome  . Fibromyalgia     Past Surgical History  Procedure Laterality Date  . Spinal cord stimulator    . Occipital nerve stimulator insertion    . Cholecystectomy  2000  . Knee surgery    . Shoulder surgery    . Tubal ligation  2000  . Endometrial ablation    . Spinal cord stimulator implant  2008    There were no vitals filed for this visit.  Visit Diagnosis:  Right knee pain - Plan: PT plan of care cert/re-cert  Decreased range of motion of knee, right - Plan: PT plan of care cert/re-cert      Subjective Assessment - 01/18/15 1334    Subjective I cannot straighten my knee.  i had to go up and down a lot of stairs yesterday and my right knee hurt and swelled a lot.   Limitations Walking   How long can you walk comfortably? 5-10 minutes.   Patient Stated Goals Get rid of mt right knee pain so I can walk to lose weight.   Currently in Pain? Yes   Pain Score 6    Pain Location Knee   Pain Orientation Right   Pain Descriptors / Indicators Aching;Throbbing   Pain Type Acute pain   Pain Onset 1 to 4 weeks ago   Pain Frequency Constant   Aggravating Factors  Walking, squatting; staying on it and climbing stairs.   Pain Relieving Factors Not walking or climbing stairs.            [facility] PT Assessment - 01/18/15 0001    Assessment   Medical Diagnosis Right knee pain.   Referring Provider Aundria Mems MD   Onset Date/Surgical Date --  Months but intense episode last weekend.   Precautions   Precautions None   Restrictions    Weight Bearing Restrictions No   Balance Screen   Has the patient fallen in the past 6 months Yes   How many times? 2   Has the patient had a decrease in activity level because of a fear of falling?  No   Is the patient reluctant to leave their home because of a fear of falling?  No   Home Ec[employer] residence   Prior Function   Level of Independence Independent   Observation/Other Assessments-Edema    Edema Circumferential   Circumferential Edema   Circumferential - Right RT 2 cms > LT.   ROM / Strength   AROM / PROM / Strength AROM;Strength   AROM   Overall AROM Comments -32 degrees of right knee extension (blocking sensation) and flexion to 95 degrees.   Strength   Overall Strength Comments Unable to accurately assess strength due to patient's high pain-level.   Palpation   Palpation comment Right knee medial joint line tenderness and pain reported in popliteal fossa.   Ambulation/Gait   Gait Comments Very antalgic gait pattern with right kne held in flexion with a knee brace with a patellar orifice.  [facility] Adult PT Treatment/Exercise - 01/18/15 0001    Modalities   Modalities Electrical Stimulation   Electrical Stimulation   Electrical Stimulation Location Right knee.   Electrical Stimulation Action IFC at 80-150 HZ x 15 minutes                     PT Long Term Goals - 01/18/15 1742    PT LONG TERM GOAL #1   Title Ind with a HEP.   Time 6   Period Weeks   Status New   PT LONG TERM GOAL #2   Title Full active right kne extension   Time 6   Period Weeks   Status New   PT LONG TERM GOAL #3   Title Active right knee flexion to 125 degrees.   Time 6   Period Weeks   Status New   PT LONG TERM GOAL #4   Title Perform ADL's with pain not > 3/10.   Time 6   Status New   PT LONG TERM GOAL #5   Title Perform a reciprocating stair gait with pain not > 3/10.               Plan -  01/18/15 1337    Clinical Impression Statement The patient states on and off right knee pain over the last few months and her right kne ehas been popping.  Patient slipped on water in her kitchen last weekend then as she was getting in bed she bent her right knee and heard a loud "pop" and then her knee swelled a lot.  During a recent MD visit her knee was aspirated and an [place] test was performed appartentlt revealing a menicus tear.  Her resting pain-level is a 5-6/10 and close to 10/10 with stairs.   Pt will benefit from skilled therapeutic intervention in order to improve on the following deficits Pain;Decreased activity tolerance;Decreased range of motion   Rehab Potential Fair   PT Frequency 2x / week   PT Duration 6 weeks   PT Treatment/Interventions ADLs/Self Care Home Management;Electrical Stimulation;Therapeutic activities;Therapeutic exercise;Neuromuscular re-education;Manual techniques   PT Next Visit Plan Nustep; gentle ROM.  Tried vasocompression but patient did not care for it.         Problem List Patient Active Problem List   Diagnosis Date Noted  . Right knee pain 01/10/2015  . Chronic pain 12/19/2014  . Long term current use of opiate analgesic 12/19/2014  . Long term prescription opiate use 12/19/2014  . Opiate use 12/19/2014  . Opiate dependence ([place]) 12/19/2014  . Encounter for therapeutic drug level monitoring 12/19/2014  . Musculoskeletal pain 12/19/2014  . Muscle cramping 12/19/2014  . Osteoarthrosis 12/19/2014  . Vitamin D insufficiency 12/19/2014  . 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 Encounter for management of implanted device 12/19/2014  . Chronic low back pain (R>L) 12/19/2014  . Chronic radicular lumbar pain (R>L) 12/19/2014  . Chronic hip pain (Left) 12/19/2014  . Chronic neck pain 12/19/2014  . Chronic upper extremity pain (Bilateral) (L>R) 12/19/2014  . Morbid obesity ([place]) 12/19/2014  .  Nicotine dependence 12/19/2014  . Chronic radicular cervical pain (Bilateral) (L>R) 12/19/2014  . Chronic pain of lower extremity (Bilateral) (R>L) 12/19/2014  . Obesity 10/27/2014  . Generalized anxiety disorder 03/16/2014  . Sepsis affecting skin 10/10/2013  . Abscess of axillary fold 10/10/2013  . Essential hypertension, benign 07/09/2013  . Chronic pain syndrome 07/09/2013  . GERD 07/02/2007  .  IRRITABLE BOWEL SYNDROME 07/02/2007  . KIDNEY STONES 07/02/2007    Karman Veney, [place] MPT 01/18/2015, 5:46 PM  [facility] [address] [place], [place], [address] Phone: [phone]   Fax:  [phone]  Name: VIRGIN GREISEN MRN: [id number] Date of Birth: 10/21/1975

## 2015-01-19 ENCOUNTER — Ambulatory Visit: Payer: BLUE CROSS/BLUE SHIELD | Admitting: Pain Medicine

## 2015-01-20 ENCOUNTER — Encounter: Payer: Self-pay | Admitting: Family Medicine

## 2015-01-20 ENCOUNTER — Ambulatory Visit (INDEPENDENT_AMBULATORY_CARE_PROVIDER_SITE_OTHER): Payer: BLUE CROSS/BLUE SHIELD | Admitting: Family Medicine

## 2015-01-20 VITALS — BP 141/86 | HR 111 | Temp 98.5°F | Wt 231.0 lb

## 2015-01-20 DIAGNOSIS — F329 Major depressive disorder, single episode, unspecified: Secondary | ICD-10-CM

## 2015-01-20 DIAGNOSIS — R05 Cough: Secondary | ICD-10-CM

## 2015-01-20 DIAGNOSIS — I10 Essential (primary) hypertension: Secondary | ICD-10-CM | POA: Diagnosis not present

## 2015-01-20 DIAGNOSIS — F418 Other specified anxiety disorders: Secondary | ICD-10-CM | POA: Diagnosis not present

## 2015-01-20 DIAGNOSIS — F419 Anxiety disorder, unspecified: Secondary | ICD-10-CM

## 2015-01-20 DIAGNOSIS — F32A Depression, unspecified: Secondary | ICD-10-CM | POA: Insufficient documentation

## 2015-01-20 DIAGNOSIS — R059 Cough, unspecified: Secondary | ICD-10-CM

## 2015-01-20 DIAGNOSIS — E559 Vitamin D deficiency, unspecified: Secondary | ICD-10-CM

## 2015-01-20 HISTORY — DX: Anxiety disorder, unspecified: F41.9

## 2015-01-20 HISTORY — DX: Depression, unspecified: F32.A

## 2015-01-20 MED ORDER — BENZONATATE 200 MG PO CAPS: 200.0000 mg | ORAL_CAPSULE | Freq: Three times a day (TID) | ORAL | Status: DC | PRN

## 2015-01-20 MED ORDER — AMITRIPTYLINE HCL 100 MG PO TABS: 100.0000 mg | ORAL_TABLET | Freq: Every day | ORAL | Status: DC

## 2015-01-20 MED ORDER — PREDNISONE 20 MG PO TABS: ORAL_TABLET | ORAL | Status: DC

## 2015-01-20 MED ORDER — DULOXETINE HCL 30 MG PO CPEP: 30.0000 mg | ORAL_CAPSULE | Freq: Every day | ORAL | Status: DC

## 2015-01-20 MED ORDER — LISINOPRIL-HYDROCHLOROTHIAZIDE 10-12.5 MG PO TABS: 1.0000 | ORAL_TABLET | Freq: Every day | ORAL | Status: DC

## 2015-01-20 NOTE — Progress Notes (Signed)
CC: Mary Cruz is a 39 y.o. female is here for Medication Refill; Sinusitis; and Labs Only   Subjective: HPI:  Follow-up essential hypertension: Requesting refills on lisinopril-hydrochlorothiazide. She reports blood pressures from home being in the normotensive range. she denies chest pain shortness of breath or peripheral edema.  Follow-up anxiety and depression. She tells me depression seems to be under control however anxiety and irritability seems to be worsening. Its moderate in severity on a daily basis. It's worse when her youngest daughter is not cooperative. Nothing else seems to make it better or worse. She tends to take Lexapro daily and does not think it's working from an anxiety standpoint.  Follow vitamin D deficiency: She got a mild degree of improvement with respect to fatigue since starting this medication.  Her major complaint today is a nonproductive cough that has been present on-and-off for the past 1 or 2 weeks. Its moderate severity and present all hours of the day. She tried over-the-counter cough medications and nothing seems to help. She denies wheezing or shortness of breath. Denies fevers or chills   Review Of Systems Outlined In HPI  Past Medical History  Diagnosis Date  . Osteoarthritis   . Hypertension   . Pain     chronic regional pain syndrome  . Fibromyalgia     Past Surgical History  Procedure Laterality Date  . Spinal cord stimulator    . Occipital nerve stimulator insertion    . Cholecystectomy  2000  . Knee surgery    . Shoulder surgery    . Tubal ligation  2000  . Endometrial ablation    . Spinal cord stimulator implant  2008   Family History  Problem Relation Age of Onset  . Depression Mother     maternal grandmother  . Asthma Mother   . Fibromyalgia      aunt  . Stroke      grandmother  . Thyroid disease      grandmother    Social History   Social History  . Marital Status: Married    Spouse Name: N/A  . Number of  Children: N/A  . Years of Education: N/A   Occupational History  . Not on file.   Social History Main Topics  . Smoking status: Current Some Day Smoker    Last Attempt to Quit: 02/18/2013  . Smokeless tobacco: Not on file  . Alcohol Use: No  . Drug Use: No  . Sexual Activity:    Partners: Male    Birth Control/ Protection: Surgical   Other Topics Concern  . Not on file   Social History Narrative     Objective: BP 141/86 mmHg  Pulse 111  Temp(Src) 98.5 F (36.9 C) (Oral)  Wt 231 lb (104.781 kg)  General: Alert and Oriented, No Acute Distress HEENT: Pupils equal, round, reactive to light. Conjunctivae clear.  External ears unremarkable, canals clear with intact TMs with appropriate landmarks.  Middle ear appears open without effusion. Pink inferior turbinates.  Moist mucous membranes, pharynx without inflammation nor lesions.  Neck supple without palpable lymphadenopathy nor abnormal masses. Lungs: Clear to auscultation bilaterally, no wheezing/ronchi/rales.  Comfortable work of breathing. Good air movement. Occasional coughing Cardiac: Regular rate and rhythm.  Extremities: No peripheral edema.  Strong peripheral pulses.  Mental Status: No depression, anxiety, nor agitation. Skin: Warm and dry.  Assessment & Plan: Mary Cruz was seen today for medication refill, sinusitis and labs only.  Diagnoses and all orders for this visit:  Essential hypertension, benign -     lisinopril-hydrochlorothiazide (PRINZIDE,ZESTORETIC) 10-12.5 MG tablet; Take 1 tablet by mouth daily.  Anxiety and depression -     amitriptyline (ELAVIL) 100 MG tablet; Take 1 tablet (100 mg total) by mouth at bedtime. -     DULoxetine (CYMBALTA) 30 MG capsule; Take 1 capsule (30 mg total) by mouth daily.  Vitamin D deficiency -     Vitamin D (25 hydroxy)  Cough -     benzonatate (TESSALON) 200 MG capsule; Take 1 capsule (200 mg total) by mouth 3 (three) times daily as needed for cough. -     predniSONE  (DELTASONE) 20 MG tablet; Three tabs at once daily for five days.   Essential hypertension: Controlled continue on lisinopril-hydrochlorothiazide  Anxiety: Uncontrolled continue amitriptyline but switching from Lexapro to Cymbalta Vitamin D deficiency: Due for repeat vitamin D level today Cough: Start prednisone burst along with Tessalon Perles. Please call me on Monday if no signs of improvement, next step would be adding an antibiotic.   No Follow-up on file.

## 2015-01-21 LAB — VITAMIN D 25 HYDROXY (VIT D DEFICIENCY, FRACTURES): Vit D, 25-Hydroxy: 38 ng/mL (ref 30–100)

## 2015-01-23 ENCOUNTER — Ambulatory Visit (INDEPENDENT_AMBULATORY_CARE_PROVIDER_SITE_OTHER): Payer: BLUE CROSS/BLUE SHIELD | Admitting: Physician Assistant

## 2015-01-23 ENCOUNTER — Encounter: Payer: Self-pay | Admitting: Physician Assistant

## 2015-01-23 ENCOUNTER — Ambulatory Visit (INDEPENDENT_AMBULATORY_CARE_PROVIDER_SITE_OTHER): Payer: BLUE CROSS/BLUE SHIELD

## 2015-01-23 VITALS — BP 135/72 | HR 94 | Ht 64.0 in | Wt 233.0 lb

## 2015-01-23 DIAGNOSIS — J209 Acute bronchitis, unspecified: Secondary | ICD-10-CM | POA: Insufficient documentation

## 2015-01-23 DIAGNOSIS — J45901 Unspecified asthma with (acute) exacerbation: Secondary | ICD-10-CM

## 2015-01-23 DIAGNOSIS — R05 Cough: Secondary | ICD-10-CM

## 2015-01-23 DIAGNOSIS — R059 Cough, unspecified: Secondary | ICD-10-CM

## 2015-01-23 HISTORY — DX: Acute bronchitis, unspecified: J20.9

## 2015-01-23 HISTORY — DX: Unspecified asthma with (acute) exacerbation: J45.901

## 2015-01-23 MED ORDER — IPRATROPIUM-ALBUTEROL 0.5-2.5 (3) MG/3ML IN SOLN
3.0000 mL | Freq: Once | RESPIRATORY_TRACT | Status: AC
  Administered 2015-01-23: 3 mL via RESPIRATORY_TRACT

## 2015-01-23 MED ORDER — ALBUTEROL SULFATE 108 (90 BASE) MCG/ACT IN AEPB: 2.0000 | INHALATION_SPRAY | RESPIRATORY_TRACT | Status: DC | PRN

## 2015-01-23 MED ORDER — PREDNISONE 20 MG PO TABS: ORAL_TABLET | ORAL | Status: DC

## 2015-01-23 MED ORDER — METHYLPREDNISOLONE SODIUM SUCC 125 MG IJ SOLR
125.0000 mg | Freq: Once | INTRAMUSCULAR | Status: AC
  Administered 2015-01-23: 125 mg via INTRAMUSCULAR

## 2015-01-23 MED ORDER — LEVOFLOXACIN 500 MG PO TABS: 500.0000 mg | ORAL_TABLET | Freq: Every day | ORAL | Status: DC

## 2015-01-23 MED ORDER — CEFTRIAXONE SODIUM 1 G IJ SOLR
1.0000 g | Freq: Once | INTRAMUSCULAR | Status: AC
  Administered 2015-01-23: 1 g via INTRAMUSCULAR

## 2015-01-23 NOTE — Progress Notes (Signed)
   Subjective:    Patient ID: Mary Cruz, female    DOB: 06-18-1975, 39 y.o.   MRN: [id number]  HPI Patient is a 39 year old female who presents to the clinic with worsening productive cough, sore throat, fatigue and weakness. She was seen by Dr. Barbaraann Barthel on 01/20/15 and given prednisone for likely viral upper respiratory infection. She has continued to worsen. Her cough is very productive. She feels very short of breath and his wheezing. She had HA. She can't stop coughing. Taking deslym OTC with no improvement.    Review of Systems  All other systems reviewed and are negative.      Objective:   Physical Exam  Constitutional: She appears well-nourished.  HENT:  Head: Normocephalic and atraumatic.  Right Ear: External ear normal.  Left Ear: External ear normal.  Oropharynx erythematous with no tonsillar swelling or exudate TM clear bilaterally. Bilateral nasal turbinates red and swollen with active rhinorrhea.  There is tenderness to palpation over bilateral maxillary sinuses.  Eyes:  Bilateral injected conjunctiva with watery discharge.   Neck: Normal range of motion. Neck supple.  Non-tender cervical adenopathy.   Cardiovascular: Normal rate, regular rhythm and normal heart sounds.   Pulmonary/Chest:  Decreased effort. Pt continually cough with production and wheezing.  She struggled to complete sentences due to cough.  Pulse ox 99 percent.  duoneb made cough worse and patient had to stop.  Bilateral lung wheezing and rhonchi throughout all fields.   Lymphadenopathy:    She has cervical adenopathy.  Skin: She is diaphoretic.  Flushed cheeks.           Assessment & Plan:  Acute bronchitis with acute asthma exacerbation - patient worsened with DuoNeb. She did not complete nebulizer. I did give her proair every 2-4 hours as needed for SOB and wheezing. Shot of Solu-Medrol 125 mg IM and Rocephin 1 g were given today. She was sent home with an extended prednisone taper  as well as Levaquin 500 mg for 10 days. She was instructed to follow-up with PCP on Friday before the weekend.

## 2015-01-25 ENCOUNTER — Encounter: Payer: BLUE CROSS/BLUE SHIELD | Admitting: Physical Therapy

## 2015-01-26 ENCOUNTER — Encounter: Payer: Self-pay | Admitting: Physical Therapy

## 2015-01-26 ENCOUNTER — Ambulatory Visit: Payer: BLUE CROSS/BLUE SHIELD | Admitting: Physical Therapy

## 2015-01-26 DIAGNOSIS — M24661 Ankylosis, right knee: Secondary | ICD-10-CM

## 2015-01-26 DIAGNOSIS — M25561 Pain in right knee: Secondary | ICD-10-CM

## 2015-01-26 NOTE — Therapy (Signed)
[facility] [place], [place], [address] Phone: [phone]   Fax:  [phone]  Physical Therapy Treatment  Patient Details  Name: Mary Cruz MRN: [id number] Date of Birth: [birth year] Referring Provider: Aundria Mems MD  Encounter Date: 01/26/2015      PT End of Session - 01/26/15 1311    Visit Number 2   PT Start Time [id number]   PT Stop Time 1352   PT Time Calculation (min) 46 min   Activity Tolerance Patient limited by pain   Behavior During Therapy [facility] for tasks assessed/performed      Past Medical History  Diagnosis Date  . Osteoarthritis   . Hypertension   . Pain     chronic regional pain syndrome  . Fibromyalgia     Past Surgical History  Procedure Laterality Date  . Spinal cord stimulator    . Occipital nerve stimulator insertion    . Cholecystectomy  2000  . Knee surgery    . Shoulder surgery    . Tubal ligation  2000  . Endometrial ablation    . Spinal cord stimulator implant  2008    There were no vitals filed for this visit.  Visit Diagnosis:  Right knee pain  Decreased range of motion of knee, right      Subjective Assessment - 01/26/15 1307    Subjective Reports that her knee hurts today. States that her knee still feels like it could give way.   Limitations Walking   How long can you walk comfortably? 5-10 minutes.   Patient Stated Goals Get rid of mt right knee pain so I can walk to lose weight.   Currently in Pain? Yes   Pain Score 7    Pain Location Knee   Pain Orientation Right   Pain Descriptors / Indicators Constant   Pain Type Acute pain   Pain Onset 1 to 4 weeks ago            [facility] PT Assessment - 01/26/15 0001    Assessment   Medical Diagnosis Right knee pain.   Next MD Visit 02/07/2015   Precautions   Precautions None                     [facility] Adult PT Treatment/Exercise - 01/26/15 0001    Exercises   Exercises Knee/Hip   Knee/Hip  Exercises: Aerobic   Nustep L4 x2 min  Experienced increased lateral R knee pain   Knee/Hip Exercises: Seated   Ball Squeeze 3 sec hold x20 reps   Knee/Hip Exercises: Supine   Quad Sets Strengthening;Right;1 set;10 reps   Short Arc Quad Sets AROM;Right;3 sets;10 reps   Straight Leg Raises AROM;Right;3 sets;10 reps   Straight Leg Raise with External Rotation AROM;Right  x3 reps but discontinued d/t increased pain   Knee/Hip Exercises: Sidelying   Hip ABduction AROM;Right;1 set;10 reps   Modalities   Modalities Education officer, environmental Stimulation Location Right knee.   Electrical Stimulation Action IFC   Electrical Stimulation Parameters 1-10 Hz x15 min   Electrical Stimulation Goals Pain;Edema   Manual Therapy   Manual Therapy Passive ROM   Passive ROM PROM of R knee into flex/ext with gentle holds at end range                     PT Long Term Goals - 01/18/15 1742    PT LONG TERM GOAL #1  Title Ind with a HEP.   Time 6   Period Weeks   Status New   PT LONG TERM GOAL #2   Title Full active right kne extension   Time 6   Period Weeks   Status New   PT LONG TERM GOAL #3   Title Active right knee flexion to 125 degrees.   Time 6   Period Weeks   Status New   PT LONG TERM GOAL #4   Title Perform ADL's with pain not > 3/10.   Time 6   Status New   PT LONG TERM GOAL #5   Title Perform a reciprocating stair gait with pain not > 3/10.               Plan - 01/26/15 1340    Clinical Impression Statement Patient did fairly well during today's treatment although she was limited due to pain. Patient first experienced pain with attempt at NuStep. Pain under the  R patella was verbalized by patient during SAQs and with SLR. Patient experienced feeling like R knee was binding up during AROM R hip abduction in sidelying per patient report. Patient continues to experience posterior R knee tenderness and continues to present  with R knee edema. Patient's R knee is still blocked with extension duirng PROM and no audible popping was noted during today's PROM into flexion. Normal modalities response noted following removal of the modalities.    Pt will benefit from skilled therapeutic intervention in order to improve on the following deficits Pain;Decreased activity tolerance;Decreased range of motion   Rehab Potential Fair   PT Frequency 2x / week   PT Duration 6 weeks   PT Treatment/Interventions ADLs/Self Care Home Management;Electrical Stimulation;Therapeutic activities;Therapeutic exercise;Neuromuscular re-education;Manual techniques   PT Next Visit Plan Nustep; gentle ROM.  Tried vasocompression but patient did not care for it.   Consulted and Agree with Plan of Care Patient        Problem List Patient Active Problem List   Diagnosis Date Noted  . Acute bronchitis with asthma with acute exacerbation 01/23/2015  . Anxiety and depression 01/20/2015  . Right knee pain 01/10/2015  . Chronic pain 12/19/2014  . Long term current use of opiate analgesic 12/19/2014  . Long term prescription opiate use 12/19/2014  . Opiate use 12/19/2014  . Opiate dependence ([place]) 12/19/2014  . Encounter for therapeutic drug level monitoring 12/19/2014  . Musculoskeletal pain 12/19/2014  . Muscle cramping 12/19/2014  . Osteoarthrosis 12/19/2014  . Vitamin D insufficiency 12/19/2014  . 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 Encounter for management of implanted device 12/19/2014  . Chronic low back pain (R>L) 12/19/2014  . Chronic radicular lumbar pain (R>L) 12/19/2014  . Chronic hip pain (Left) 12/19/2014  . Chronic neck pain 12/19/2014  . Chronic upper extremity pain (Bilateral) (L>R) 12/19/2014  . Morbid obesity ([place]) 12/19/2014  . Nicotine dependence 12/19/2014  . Chronic radicular cervical pain (Bilateral) (L>R) 12/19/2014  . Chronic pain of lower extremity  (Bilateral) (R>L) 12/19/2014  . Obesity 10/27/2014  . Generalized anxiety disorder 03/16/2014  . Sepsis affecting skin 10/10/2013  . Abscess of axillary fold 10/10/2013  . Essential hypertension, benign 07/09/2013  . Chronic pain syndrome 07/09/2013  . GERD 07/02/2007  . IRRITABLE BOWEL SYNDROME 07/02/2007  . KIDNEY STONES 07/02/2007    Wynelle Fanny, PTA 01/26/2015, 2:10 PM  [facility] [address] [place], [place], [address] Phone: [phone]  Fax:  [phone]  Name: Mary Cruz MRN: [id number] Date of Birth: [birth year]

## 2015-01-27 ENCOUNTER — Ambulatory Visit (INDEPENDENT_AMBULATORY_CARE_PROVIDER_SITE_OTHER): Payer: BLUE CROSS/BLUE SHIELD | Admitting: Family Medicine

## 2015-01-27 ENCOUNTER — Encounter: Payer: Self-pay | Admitting: Family Medicine

## 2015-01-27 VITALS — BP 125/80 | HR 98 | Wt 233.0 lb

## 2015-01-27 DIAGNOSIS — R05 Cough: Secondary | ICD-10-CM

## 2015-01-27 DIAGNOSIS — R059 Cough, unspecified: Secondary | ICD-10-CM

## 2015-01-27 MED ORDER — HYDROCODONE-HOMATROPINE 5-1.5 MG/5ML PO SYRP: 5.0000 mL | ORAL_SOLUTION | Freq: Three times a day (TID) | ORAL | Status: DC | PRN

## 2015-01-27 NOTE — Progress Notes (Signed)
CC: Mary Cruz is a 39 y.o. female is here for Bronchitis   Subjective: HPI:  Cough: She believes that she is getting better. She is still wheezing but not nearly as much as she is used to. She says her cough has decreased in frequency drastically. She still coughs a few times during the day and it is keeping her awake at night. It seems to be worse at night. It's now nonproductive. She denies any side effects from her current medications. Tessalon Perles was not very helpful. Denies fevers, chills, shortness of breath, chest discomfort or blood in sputum.   Review Of Systems Outlined In HPI  Past Medical History  Diagnosis Date  . Osteoarthritis   . Hypertension   . Pain     chronic regional pain syndrome  . Fibromyalgia     Past Surgical History  Procedure Laterality Date  . Spinal cord stimulator    . Occipital nerve stimulator insertion    . Cholecystectomy  2000  . Knee surgery    . Shoulder surgery    . Tubal ligation  2000  . Endometrial ablation    . Spinal cord stimulator implant  2008   Family History  Problem Relation Age of Onset  . Depression Mother     maternal grandmother  . Asthma Mother   . Fibromyalgia      aunt  . Stroke      grandmother  . Thyroid disease      grandmother    Social History   Social History  . Marital Status: Married    Spouse Name: N/A  . Number of Children: N/A  . Years of Education: N/A   Occupational History  . Not on file.   Social History Main Topics  . Smoking status: Current Some Day Smoker    Last Attempt to Quit: 02/18/2013  . Smokeless tobacco: Not on file  . Alcohol Use: No  . Drug Use: No  . Sexual Activity:    Partners: Male    Birth Control/ Protection: Surgical   Other Topics Concern  . Not on file   Social History Narrative     Objective: BP 125/80 mmHg  Pulse 98  Wt 233 lb (105.688 kg)  SpO2 98%  General: Alert and Oriented, No Acute Distress HEENT: Pupils equal, round, reactive to  light. Conjunctivae clear.  External ears unremarkable, canals clear with intact TMs with appropriate landmarks.  Middle ear appears open without effusion. Pink inferior turbinates.  Moist mucous membranes, pharynx without inflammation nor lesions.  Neck supple without palpable lymphadenopathy nor abnormal masses. Lungs: Clear to auscultation bilaterally, no wheezing/ronchi/rales.  Comfortable work of breathing. Good air movement. Extremities: No peripheral edema.  Strong peripheral pulses.  Mental Status: No depression, anxiety, nor agitation. Skin: Warm and dry.  Assessment & Plan: Mary Cruz was seen today for bronchitis.  Diagnoses and all orders for this visit:  Cough -     HYDROcodone-homatropine (HYCODAN) 5-1.5 MG/5ML syrup; Take 5 mLs by mouth every 8 (eight) hours as needed for cough.   Exam is much improved compared to what Mary Cruz described earlier this week. She is on the road to recovery. To help with sleep adding Hycodan.  Return if symptoms worsen or fail to improve.

## 2015-01-28 ENCOUNTER — Other Ambulatory Visit: Payer: Self-pay | Admitting: Family Medicine

## 2015-01-30 ENCOUNTER — Encounter: Payer: Self-pay | Admitting: Physical Therapy

## 2015-01-30 ENCOUNTER — Ambulatory Visit: Payer: BLUE CROSS/BLUE SHIELD | Admitting: Physical Therapy

## 2015-01-30 ENCOUNTER — Telehealth: Payer: Self-pay

## 2015-01-30 DIAGNOSIS — M25561 Pain in right knee: Secondary | ICD-10-CM

## 2015-01-30 DIAGNOSIS — F411 Generalized anxiety disorder: Secondary | ICD-10-CM

## 2015-01-30 DIAGNOSIS — M24661 Ankylosis, right knee: Secondary | ICD-10-CM

## 2015-01-30 MED ORDER — CLONAZEPAM 0.5 MG PO TABS: 0.2500 mg | ORAL_TABLET | Freq: Two times a day (BID) | ORAL | Status: DC | PRN

## 2015-01-30 MED ORDER — FLUCONAZOLE 150 MG PO TABS: ORAL_TABLET | ORAL | Status: AC

## 2015-01-30 NOTE — Therapy (Signed)
[facility] [place], [place], [address] Phone: [phone]   Fax:  [phone]  Physical Therapy Treatment  Patient Details  Name: Mary Cruz MRN: [id number] Date of Birth: 12-Jan-1976 Referring Provider: Aundria Mems MD  Encounter Date: 01/30/2015      PT End of Session - 01/30/15 1504    Visit Number 3   Number of Visits 12   Date for PT Re-Evaluation 03/01/15   PT Start Time [id number]   PT Stop Time 1519  2 units secondary to patient's late arrival   PT Time Calculation (min) 34 min   Activity Tolerance Patient tolerated treatment well   Behavior During Therapy [facility] for tasks assessed/performed      Past Medical History  Diagnosis Date  . Osteoarthritis   . Hypertension   . Pain     chronic regional pain syndrome  . Fibromyalgia     Past Surgical History  Procedure Laterality Date  . Spinal cord stimulator    . Occipital nerve stimulator insertion    . Cholecystectomy  2000  . Knee surgery    . Shoulder surgery    . Tubal ligation  2000  . Endometrial ablation    . Spinal cord stimulator implant  2008    There were no vitals filed for this visit.  Visit Diagnosis:  Right knee pain  Decreased range of motion of knee, right      Subjective Assessment - 01/30/15 1447    Subjective Reports that she saw Dr. Darene Lamer as she was leaving MD appt last week and he said to continue PT this week. Reports that her knee hurts while driving.   Limitations Walking   How long can you walk comfortably? 5-10 minutes.   Patient Stated Goals Get rid of mt right knee pain so I can walk to lose weight.   Currently in Pain? Yes   Pain Score 5    Pain Location Knee   Pain Orientation Right   Pain Descriptors / Indicators Aching;Sharp;Throbbing;Burning   Pain Type Acute pain   Pain Onset 1 to 4 weeks ago   Pain Frequency Constant            [facility] PT Assessment - 01/30/15 0001    Assessment   Medical  Diagnosis Right knee pain.   Next MD Visit 02/07/2015                     [facility] Adult PT Treatment/Exercise - 01/30/15 0001    Knee/Hip Exercises: Seated   Ball Squeeze 3 sec hold x20 reps   Knee/Hip Exercises: Supine   Short Arc Quad Sets AROM;Right;3 sets;10 reps   Straight Leg Raises AROM;Right;3 sets;10 reps   Knee/Hip Exercises: Sidelying   Hip ABduction AROM;Right;2 sets;10 reps   Knee/Hip Exercises: Prone   Hip Extension AROM;Right;2 sets;10 reps   Modalities   Modalities Education officer, environmental Stimulation Location Right knee.   Electrical Stimulation Action IFC   Electrical Stimulation Parameters 80-150 Hz x15 min   Electrical Stimulation Goals Pain;Edema                     PT Long Term Goals - 01/18/15 1742    PT LONG TERM GOAL #1   Title Ind with a HEP.   Time 6   Period Weeks   Status New   PT LONG TERM GOAL #2   Title Full active right kne  extension   Time 6   Period Weeks   Status New   PT LONG TERM GOAL #3   Title Active right knee flexion to 125 degrees.   Time 6   Period Weeks   Status New   PT LONG TERM GOAL #4   Title Perform ADL's with pain not > 3/10.   Time 6   Status New   PT LONG TERM GOAL #5   Title Perform a reciprocating stair gait with pain not > 3/10.               Plan - 01/30/15 1523    Clinical Impression Statement Patient tolerated today's treatment well with no pain verbalized today only soreness with hip abduction. Exercises were slim based on pain experienced in previous treatment. Ambulated into therapy with antalgic gait and with R knee brace donned. Experiences R knee catching and giving out per patient report. Has reported that she can achieve knee extension i nsupine but as she stands her knee bends again. Normal stimulation response noted following removal of the modality. Experienced 4/10 R knee pain following today's treatment.   Pt will benefit from  skilled therapeutic intervention in order to improve on the following deficits Pain;Decreased activity tolerance;Decreased range of motion   Rehab Potential Fair   PT Frequency 2x / week   PT Duration 6 weeks   PT Treatment/Interventions ADLs/Self Care Home Management;Electrical Stimulation;Therapeutic activities;Therapeutic exercise;Neuromuscular re-education;Manual techniques   PT Next Visit Plan Continue with pain free exercises per MPT POC.   Consulted and Agree with Plan of Care Patient        Problem List Patient Active Problem List   Diagnosis Date Noted  . Acute bronchitis with asthma with acute exacerbation 01/23/2015  . Anxiety and depression 01/20/2015  . Right knee pain 01/10/2015  . Chronic pain 12/19/2014  . Long term current use of opiate analgesic 12/19/2014  . Long term prescription opiate use 12/19/2014  . Opiate use 12/19/2014  . Opiate dependence ([place]) 12/19/2014  . Encounter for therapeutic drug level monitoring 12/19/2014  . Musculoskeletal pain 12/19/2014  . Muscle cramping 12/19/2014  . Osteoarthrosis 12/19/2014  . Vitamin D insufficiency 12/19/2014  . 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 Encounter for management of implanted device 12/19/2014  . Chronic low back pain (R>L) 12/19/2014  . Chronic radicular lumbar pain (R>L) 12/19/2014  . Chronic hip pain (Left) 12/19/2014  . Chronic neck pain 12/19/2014  . Chronic upper extremity pain (Bilateral) (L>R) 12/19/2014  . Morbid obesity ([place]) 12/19/2014  . Nicotine dependence 12/19/2014  . Chronic radicular cervical pain (Bilateral) (L>R) 12/19/2014  . Chronic pain of lower extremity (Bilateral) (R>L) 12/19/2014  . Obesity 10/27/2014  . Generalized anxiety disorder 03/16/2014  . Sepsis affecting skin 10/10/2013  . Abscess of axillary fold 10/10/2013  . Essential hypertension, benign 07/09/2013  . Chronic pain syndrome 07/09/2013  . GERD 07/02/2007   . IRRITABLE BOWEL SYNDROME 07/02/2007  . KIDNEY STONES 07/02/2007    Wynelle Fanny, PTA 01/30/2015, 3:38 PM  [facility] [address] [place], [place], [address] Phone: [phone]   Fax:  [phone]  Name: KALLYN MORAES MRN: [id number] Date of Birth: 1976-02-05

## 2015-01-30 NOTE — Telephone Encounter (Signed)
[place], Rx placed in in-box ready for pickup/faxing.  

## 2015-01-30 NOTE — Telephone Encounter (Signed)
Pt called requesting an antibiotic, she stated that she feels like she getting a yeast infection.  She is also requesting a refill on clonazepam.

## 2015-01-30 NOTE — Telephone Encounter (Signed)
Rx faxed

## 2015-02-01 ENCOUNTER — Encounter: Payer: BLUE CROSS/BLUE SHIELD | Admitting: Physical Therapy

## 2015-02-02 ENCOUNTER — Ambulatory Visit: Payer: BLUE CROSS/BLUE SHIELD | Admitting: Physical Therapy

## 2015-02-02 ENCOUNTER — Telehealth: Payer: Self-pay

## 2015-02-02 DIAGNOSIS — M25561 Pain in right knee: Secondary | ICD-10-CM

## 2015-02-02 DIAGNOSIS — M24661 Ankylosis, right knee: Secondary | ICD-10-CM

## 2015-02-02 MED ORDER — TERCONAZOLE 80 MG VA SUPP: 80.0000 mg | Freq: Every day | VAGINAL | Status: DC

## 2015-02-02 NOTE — Therapy (Addendum)
[facility] [place], [place], [address] Phone: [phone]   Fax:  [phone]  Physical Therapy Treatment  Patient Details  Name: Mary Cruz MRN: [id number] Date of Birth: [birth year] Referring Provider: Aundria Mems MD  Encounter Date: 02/02/2015      PT End of Session - 02/02/15 1555    Visit Number 4   Number of Visits 12   Date for PT Re-Evaluation 03/01/15   PT Start Time 0147   PT Stop Time 0235   PT Time Calculation (min) 48 min   Activity Tolerance Patient tolerated treatment well;Patient limited by pain   Behavior During Therapy --  "frustrated."      Past Medical History  Diagnosis Date  . Osteoarthritis   . Hypertension   . Pain     chronic regional pain syndrome  . Fibromyalgia     Past Surgical History  Procedure Laterality Date  . Spinal cord stimulator    . Occipital nerve stimulator insertion    . Cholecystectomy  2000  . Knee surgery    . Shoulder surgery    . Tubal ligation  2000  . Endometrial ablation    . Spinal cord stimulator implant  2008    There were no vitals filed for this visit.  Visit Diagnosis:  Right knee pain  Decreased range of motion of knee, right      Subjective Assessment - 02/02/15 1428    Subjective Patient frustrated today.  She has continued c/o her right knee locking and giving way and cannot fully straighten her right knee.                         [facility] Adult PT Treatment/Exercise - 02/02/15 0001    Knee/Hip Exercises: Aerobic   Nustep level 2 x 15 minutes.   Acupuncturist Location Right knee.   Electrical Stimulation Action IFC   Electrical Stimulation Parameters 80-150 HZ x 15 minutes.   Electrical Stimulation Goals Edema;Pain   Manual Therapy   Manual Therapy Passive ROM   Passive ROM PROM to patient's right knee in supine x 9 minutes.                     PT Long  Term Goals - 02/02/15 1527    PT LONG TERM GOAL #1   Title Ind with a HEP.   Time 6   Period Weeks   Status New   PT LONG TERM GOAL #2   Title Full active right kne extension   Status On-going  -18 degrees.   PT LONG TERM GOAL #3   Title Active right knee flexion to 125 degrees.   Time 6   Status On-going  105 degrees.   PT LONG TERM GOAL #4   Title Perform ADL's with pain not > 3/10.   Time 6   Status On-going   PT LONG TERM GOAL #5   Title Perform a reciprocating stair gait with pain not > 3/10.   Time 6   Period Weeks   Status On-going               Plan - 02/02/15 1428    Clinical Impression Statement (p) Patient frustrated today.  She has continued c/o her right knee locking and giving way and cannot fully straighten her right knee.  She rates her pain at a 6/10.  She would really like to proceed  with an CT scan at this point.  Patient has made very little progress in PT at this point.  ROM was limited to -18 degrees of right knee extension and flexion to 105 degrees today.   Pt will benefit from skilled therapeutic intervention in order to improve on the following deficits (p) Pain;Decreased activity tolerance;Decreased range of motion   Rehab Potential (p) Fair   PT Frequency (p) 2x / week   PT Duration (p) 6 weeks   PT Treatment/Interventions (p) ADLs/Self Care Home Management;Electrical Stimulation;Therapeutic activities;Therapeutic exercise;Neuromuscular re-education;Manual techniques   PT Next Visit Plan (p) Continue with pain free exercises per MPT POC.   Consulted and Agree with Plan of Care (p) Patient        Problem List Patient Active Problem List   Diagnosis Date Noted  . Acute bronchitis with asthma with acute exacerbation 01/23/2015  . Anxiety and depression 01/20/2015  . Right knee pain 01/10/2015  . Chronic pain 12/19/2014  . Long term current use of opiate analgesic 12/19/2014  . Long term prescription opiate use 12/19/2014  . Opiate use  12/19/2014  . Opiate dependence ([place]) 12/19/2014  . Encounter for therapeutic drug level monitoring 12/19/2014  . Musculoskeletal pain 12/19/2014  . Muscle cramping 12/19/2014  . Osteoarthrosis 12/19/2014  . Vitamin D insufficiency 12/19/2014  . 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 Encounter for management of implanted device 12/19/2014  . Chronic low back pain (R>L) 12/19/2014  . Chronic radicular lumbar pain (R>L) 12/19/2014  . Chronic hip pain (Left) 12/19/2014  . Chronic neck pain 12/19/2014  . Chronic upper extremity pain (Bilateral) (L>R) 12/19/2014  . Morbid obesity ([place]) 12/19/2014  . Nicotine dependence 12/19/2014  . Chronic radicular cervical pain (Bilateral) (L>R) 12/19/2014  . Chronic pain of lower extremity (Bilateral) (R>L) 12/19/2014  . Obesity 10/27/2014  . Generalized anxiety disorder 03/16/2014  . Sepsis affecting skin 10/10/2013  . Abscess of axillary fold 10/10/2013  . Essential hypertension, benign 07/09/2013  . Chronic pain syndrome 07/09/2013  . GERD 07/02/2007  . IRRITABLE BOWEL SYNDROME 07/02/2007  . KIDNEY STONES 07/02/2007   PHYSICAL THERAPY DISCHARGE SUMMARY  Visits from Start of Care: 4.  Current functional level related to goals / functional outcomes: See above.   Remaining deficits: Continued right knee pain and loss of motion.   Education / Equipment:  Plan: Patient agrees to discharge.  Patient goals were not met. Patient is being discharged due to not returning since the last visit.  ?????      Jaedynn Bohlken, [place] MPT 02/02/2015, 3:59 PM  [facility] [address] [place], [place], [address] Phone: [phone]   Fax:  [phone]  Name: Mary Cruz MRN: [id number] Date of Birth: 12/15/1975

## 2015-02-02 NOTE — Telephone Encounter (Signed)
Vaginal suppository sent to [facility] in [place].

## 2015-02-02 NOTE — Telephone Encounter (Signed)
Pt.notified

## 2015-02-02 NOTE — Telephone Encounter (Signed)
Pt reports that the diflucan that was Rx'd is not working for her yeast infection.  Can something different be sent to the pharmacy?

## 2015-02-07 ENCOUNTER — Ambulatory Visit (INDEPENDENT_AMBULATORY_CARE_PROVIDER_SITE_OTHER): Payer: BLUE CROSS/BLUE SHIELD | Admitting: Sports Medicine

## 2015-02-07 ENCOUNTER — Encounter: Payer: Self-pay | Admitting: Sports Medicine

## 2015-02-07 DIAGNOSIS — M25561 Pain in right knee: Secondary | ICD-10-CM

## 2015-02-07 MED ORDER — HYDROCODONE-ACETAMINOPHEN 5-325 MG PO TABS: 1.0000 | ORAL_TABLET | Freq: Three times a day (TID) | ORAL | Status: DC | PRN

## 2015-02-07 NOTE — Assessment & Plan Note (Signed)
Unfortunately with persistent pain and mechanical symptoms despite injection, bracing, formal physical therapy. Cannot get an MRI due to spinal cord stimulator. At this point she is arthroscopy candidate, referral to orthopedic surgery. Small amount of hydrocodone for pain.

## 2015-02-07 NOTE — Progress Notes (Signed)
  Subjective:    CC: Follow-up  HPI: Kyndel was here a month ago with acute medial knee pain, we injected at the time, and placed her through physical therapy, she never obtained her x-rays, unfortunately she continues to have pain with mild mechanical symptoms and catching, specifically at the medial joint line despite injection.  Past medical history, Surgical history, Family history not pertinant except as noted below, Social history, Allergies, and medications have been entered into the medical record, reviewed, and no changes needed.   Review of Systems: No fevers, chills, night sweats, weight loss, chest pain, or shortness of breath.   Objective:    General: Well Developed, well nourished, and in no acute distress.  Neuro: Alert and oriented x3, extra-ocular muscles intact, sensation grossly intact.  HEENT: Normocephalic, atraumatic, pupils equal round reactive to light, neck supple, no masses, no lymphadenopathy, thyroid nonpalpable.  Skin: Warm and dry, no rashes. Cardiac: Regular rate and rhythm, no murmurs rubs or gallops, no lower extremity edema.  Respiratory: Clear to auscultation bilaterally. Not using accessory muscles, speaking in full sentences.  Impression and Recommendations:

## 2015-02-12 HISTORY — PX: KNEE SURGERY: SHX244

## 2015-02-27 ENCOUNTER — Other Ambulatory Visit: Payer: Self-pay | Admitting: *Deleted

## 2015-02-27 ENCOUNTER — Telehealth: Payer: Self-pay | Admitting: Pain Medicine

## 2015-02-27 NOTE — Telephone Encounter (Signed)
Had knee surgery and received script for 40 oxycodone for break - thru knee pain / wanted to let our office know

## 2015-03-07 ENCOUNTER — Telehealth: Payer: Self-pay

## 2015-03-07 NOTE — Telephone Encounter (Signed)
Spoke with patient, she had knee surgery last week, was given script for Oxycodone. Patient and pharmacy notified ok to fill the script. Patient reminded to notify our office anytime she gets extra pain meds.

## 2015-03-07 NOTE — Telephone Encounter (Signed)
A person from [facility] called today and said Mary Cruz came in with a oxycodone script after having a  percocet script on hold. [facility] wants to know should they fill both scripts even if it from a different provider?

## 2015-03-27 ENCOUNTER — Encounter: Payer: BLUE CROSS/BLUE SHIELD | Admitting: Pain Medicine

## 2015-04-05 ENCOUNTER — Ambulatory Visit: Payer: BLUE CROSS/BLUE SHIELD | Attending: Pain Medicine | Admitting: Pain Medicine

## 2015-04-05 ENCOUNTER — Encounter: Payer: Self-pay | Admitting: Pain Medicine

## 2015-04-05 VITALS — BP 125/66 | HR 96 | Temp 98.1°F | Resp 18 | Ht 64.0 in | Wt 220.0 lb

## 2015-04-05 DIAGNOSIS — T85840S Pain due to nervous system prosthetic devices, implants and grafts, sequela: Secondary | ICD-10-CM

## 2015-04-05 DIAGNOSIS — I10 Essential (primary) hypertension: Secondary | ICD-10-CM | POA: Insufficient documentation

## 2015-04-05 DIAGNOSIS — N200 Calculus of kidney: Secondary | ICD-10-CM | POA: Insufficient documentation

## 2015-04-05 DIAGNOSIS — R252 Cramp and spasm: Secondary | ICD-10-CM | POA: Diagnosis not present

## 2015-04-05 DIAGNOSIS — M545 Low back pain: Secondary | ICD-10-CM

## 2015-04-05 DIAGNOSIS — F419 Anxiety disorder, unspecified: Secondary | ICD-10-CM | POA: Diagnosis not present

## 2015-04-05 DIAGNOSIS — F329 Major depressive disorder, single episode, unspecified: Secondary | ICD-10-CM | POA: Insufficient documentation

## 2015-04-05 DIAGNOSIS — M797 Fibromyalgia: Secondary | ICD-10-CM | POA: Insufficient documentation

## 2015-04-05 DIAGNOSIS — Z9689 Presence of other specified functional implants: Secondary | ICD-10-CM | POA: Insufficient documentation

## 2015-04-05 DIAGNOSIS — E559 Vitamin D deficiency, unspecified: Secondary | ICD-10-CM | POA: Diagnosis not present

## 2015-04-05 DIAGNOSIS — Z9049 Acquired absence of other specified parts of digestive tract: Secondary | ICD-10-CM | POA: Diagnosis not present

## 2015-04-05 DIAGNOSIS — M542 Cervicalgia: Secondary | ICD-10-CM | POA: Diagnosis present

## 2015-04-05 DIAGNOSIS — M5412 Radiculopathy, cervical region: Secondary | ICD-10-CM

## 2015-04-05 DIAGNOSIS — F17200 Nicotine dependence, unspecified, uncomplicated: Secondary | ICD-10-CM | POA: Diagnosis not present

## 2015-04-05 DIAGNOSIS — G8929 Other chronic pain: Secondary | ICD-10-CM

## 2015-04-05 DIAGNOSIS — F411 Generalized anxiety disorder: Secondary | ICD-10-CM | POA: Insufficient documentation

## 2015-04-05 DIAGNOSIS — A419 Sepsis, unspecified organism: Secondary | ICD-10-CM | POA: Diagnosis not present

## 2015-04-05 DIAGNOSIS — M1990 Unspecified osteoarthritis, unspecified site: Secondary | ICD-10-CM | POA: Insufficient documentation

## 2015-04-05 DIAGNOSIS — M79606 Pain in leg, unspecified: Secondary | ICD-10-CM | POA: Diagnosis present

## 2015-04-05 DIAGNOSIS — K589 Irritable bowel syndrome without diarrhea: Secondary | ICD-10-CM | POA: Diagnosis not present

## 2015-04-05 DIAGNOSIS — M25559 Pain in unspecified hip: Secondary | ICD-10-CM | POA: Diagnosis present

## 2015-04-05 DIAGNOSIS — J209 Acute bronchitis, unspecified: Secondary | ICD-10-CM | POA: Diagnosis not present

## 2015-04-05 DIAGNOSIS — L02419 Cutaneous abscess of limb, unspecified: Secondary | ICD-10-CM | POA: Insufficient documentation

## 2015-04-05 DIAGNOSIS — J45901 Unspecified asthma with (acute) exacerbation: Secondary | ICD-10-CM | POA: Diagnosis not present

## 2015-04-05 DIAGNOSIS — Z79891 Long term (current) use of opiate analgesic: Secondary | ICD-10-CM | POA: Diagnosis not present

## 2015-04-05 DIAGNOSIS — F1120 Opioid dependence, uncomplicated: Secondary | ICD-10-CM | POA: Diagnosis not present

## 2015-04-05 DIAGNOSIS — Z5181 Encounter for therapeutic drug level monitoring: Secondary | ICD-10-CM | POA: Diagnosis not present

## 2015-04-05 DIAGNOSIS — K219 Gastro-esophageal reflux disease without esophagitis: Secondary | ICD-10-CM | POA: Diagnosis not present

## 2015-04-05 HISTORY — DX: Pain due to nervous system prosthetic devices, implants and grafts, sequela: T85.840S

## 2015-04-05 LAB — COMPREHENSIVE METABOLIC PANEL
ALBUMIN: 4.1 g/dL (ref 3.5–5.0)
ALK PHOS: 82 U/L (ref 38–126)
ALT: 21 U/L (ref 14–54)
AST: 20 U/L (ref 15–41)
Anion gap: 7 (ref 5–15)
BILIRUBIN TOTAL: 0.5 mg/dL (ref 0.3–1.2)
BUN: 12 mg/dL (ref 6–20)
CALCIUM: 9.2 mg/dL (ref 8.9–10.3)
CO2: 31 mmol/L (ref 22–32)
CREATININE: 0.97 mg/dL (ref 0.44–1.00)
Chloride: 103 mmol/L (ref 101–111)
GFR calc Af Amer: >60 mL/min (ref >60)
GLUCOSE: 93 mg/dL (ref 65–99)
Potassium: 3.1 mmol/L — ABNORMAL LOW (ref 3.5–5.1)
Sodium: 141 mmol/L (ref 135–145)
TOTAL PROTEIN: 7.6 g/dL (ref 6.5–8.1)

## 2015-04-05 LAB — C-REACTIVE PROTEIN: CRP: 1 mg/dL — AB (ref <1.0)

## 2015-04-05 LAB — MAGNESIUM: Magnesium: 2 mg/dL (ref 1.7–2.4)

## 2015-04-05 LAB — SEDIMENTATION RATE: Sed Rate: 35 mm/hr — ABNORMAL HIGH (ref 0–20)

## 2015-04-05 MED ORDER — OXYCODONE HCL 5 MG PO TABS: 5.0000 mg | ORAL_TABLET | Freq: Four times a day (QID) | ORAL | Status: DC | PRN

## 2015-04-05 MED ORDER — TIZANIDINE HCL 2 MG PO CAPS: 2.0000 mg | ORAL_CAPSULE | Freq: Three times a day (TID) | ORAL | Status: DC | PRN

## 2015-04-05 NOTE — Patient Instructions (Addendum)
Smoking Cessation, Tips for Success If you are ready to quit smoking, congratulations! You have chosen to help yourself be healthier. Cigarettes bring nicotine, tar, carbon monoxide, and other irritants into your body. Your lungs, heart, and blood vessels will be able to work better without these poisons. There are many different ways to quit smoking. Nicotine gum, nicotine patches, a nicotine inhaler, or nicotine nasal spray can help with physical craving. Hypnosis, support groups, and medicines help break the habit of smoking. WHAT THINGS CAN I DO TO MAKE QUITTING EASIER?  Here are some tips to help you quit for good: 1. Pick a date when you will quit smoking completely. Tell all of your friends and family about your plan to quit on that date. 2. Do not try to slowly cut down on the number of cigarettes you are smoking. Pick a quit date and quit smoking completely starting on that day. 3. Throw away all cigarettes.  4. Clean and remove all ashtrays from your home, work, and car. 5. On a card, write down your reasons for quitting. Carry the card with you and read it when you get the urge to smoke. 6. Cleanse your body of nicotine. Drink enough water and fluids to keep your urine clear or pale yellow. Do this after quitting to flush the nicotine from your body. 7. Learn to predict your moods. Do not let a bad situation be your excuse to have a cigarette. Some situations in your life might tempt you into wanting a cigarette. 8. Never have "just one" cigarette. It leads to wanting another and another. Remind yourself of your decision to quit. 9. Change habits associated with smoking. If you smoked while driving or when feeling stressed, try other activities to replace smoking. Stand up when drinking your coffee. Brush your teeth after eating. Sit in a different chair when you read the paper. Avoid alcohol while trying to quit, and try to drink fewer caffeinated beverages. Alcohol and caffeine may urge you  to smoke. 10. Avoid foods and drinks that can trigger a desire to smoke, such as sugary or spicy foods and alcohol. 11. Ask people who smoke not to smoke around you. 12. Have something planned to do right after eating or having a cup of coffee. For example, plan to take a walk or exercise. 13. Try a relaxation exercise to calm you down and decrease your stress. Remember, you may be tense and nervous for the first 2 weeks after you quit, but this will pass. 14. Find new activities to keep your hands busy. Play with a pen, coin, or rubber band. Doodle or draw things on paper. 15. Brush your teeth right after eating. This will help cut down on the craving for the taste of tobacco after meals. You can also try mouthwash.  16. Use oral substitutes in place of cigarettes. Try using lemon drops, carrots, cinnamon sticks, or chewing gum. Keep them handy so they are available when you have the urge to smoke. 17. When you have the urge to smoke, try deep breathing. 18. Designate your home as a nonsmoking area. 19. If you are a heavy smoker, ask your health care provider about a prescription for nicotine chewing gum. It can ease your withdrawal from nicotine. 20. Reward yourself. Set aside the cigarette money you save and buy yourself something nice. 21. Look for support from others. Join a support group or smoking cessation program. Ask someone at home or at work to help you with your plan   to quit smoking. 22. Always ask yourself, "Do I need this cigarette or is this just a reflex?" Tell yourself, "Today, I choose not to smoke," or "I do not want to smoke." You are reminding yourself of your decision to quit. 23. Do not replace cigarette smoking with electronic cigarettes (commonly called e-cigarettes). The safety of e-cigarettes is unknown, and some may contain harmful chemicals. 24. If you relapse, do not give up! Plan ahead and think about what you will do the next time you get the urge to smoke. HOW WILL  I FEEL WHEN I QUIT SMOKING? You may have symptoms of withdrawal because your body is used to nicotine (the addictive substance in cigarettes). You may crave cigarettes, be irritable, feel very hungry, cough often, get headaches, or have difficulty concentrating. The withdrawal symptoms are only temporary. They are strongest when you first quit but will go away within 10-14 days. When withdrawal symptoms occur, stay in control. Think about your reasons for quitting. Remind yourself that these are signs that your body is healing and getting used to being without cigarettes. Remember that withdrawal symptoms are easier to treat than the major diseases that smoking can cause.  Even after the withdrawal is over, expect periodic urges to smoke. However, these cravings are generally short lived and will go away whether you smoke or not. Do not smoke! WHAT RESOURCES ARE AVAILABLE TO HELP ME QUIT SMOKING? Your health care provider can direct you to community resources or hospitals for support, which may include: 1. Group support. 2. Education. 3. Hypnosis. 4. Therapy.   This information is not intended to replace advice given to you by your health care provider. Make sure you discuss any questions you have with your health care provider.   Document Released: 10/27/2003 Document Revised: 02/18/2014 Document Reviewed: 07/16/2012 Elsevier Interactive Patient Education 2016 [place] to get labwork drawn today at Pre admit testing.GENERAL RISKS AND COMPLICATIONS  What are the risk, side effects and possible complications? Generally speaking, most procedures are safe.  However, with any procedure there are risks, side effects, and the possibility of complications.  The risks and complications are dependent upon the sites that are lesioned, or the type of nerve block to be performed.  The closer the procedure is to the spine, the more serious the risks are.  Great care is taken when placing the  radio frequency needles, block needles or lesioning probes, but sometimes complications can occur. 1. Infection: Any time there is an injection through the skin, there is a risk of infection.  This is why sterile conditions are used for these blocks.  There are four possible types of infection. 1. Localized skin infection. 2. Central Nervous System Infection-This can be in the form of Meningitis, which can be deadly. 3. Epidural Infections-This can be in the form of an epidural abscess, which can cause pressure inside of the spine, causing compression of the spinal cord with subsequent paralysis. This would require an emergency surgery to decompress, and there are no guarantees that the patient would recover from the paralysis. 4. Discitis-This is an infection of the intervertebral discs.  It occurs in about 1% of discography procedures.  It is difficult to treat and it may lead to surgery.        2. Pain: the needles have to go through skin and soft tissues, will cause soreness.       3. Damage to internal structures:  The nerves to be lesioned may be near blood  vessels or    other nerves which can be potentially damaged.       4. Bleeding: Bleeding is more common if the patient is taking blood thinners such as  aspirin, Coumadin, Ticiid, Plavix, etc., or if he/she have some genetic predisposition  such as hemophilia. Bleeding into the spinal canal can cause compression of the spinal  cord with subsequent paralysis.  This would require an emergency surgery to  decompress and there are no guarantees that the patient would recover from the  paralysis.       5. Pneumothorax:  Puncturing of a lung is a possibility, every time a needle is introduced in  the area of the chest or upper back.  Pneumothorax refers to free air around the  collapsed lung(s), inside of the thoracic cavity (chest cavity).  Another two possible  complications related to a similar event would include: Hemothorax and Chylothorax.    These are variations of the Pneumothorax, where instead of air around the collapsed  lung(s), you may have blood or chyle, respectively.       6. Spinal headaches: They may occur with any procedures in the area of the spine.       7. Persistent CSF (Cerebro-Spinal Fluid) leakage: This is a rare problem, but may occur  with prolonged intrathecal or epidural catheters either due to the formation of a fistulous  track or a dural tear.       8. Nerve damage: By working so close to the spinal cord, there is always a possibility of  nerve damage, which could be as serious as a permanent spinal cord injury with  paralysis.       9. Death:  Although rare, severe deadly allergic reactions known as "Anaphylactic  reaction" can occur to any of the medications used.      10. Worsening of the symptoms:  We can always make thing worse.  What are the chances of something like this happening? Chances of any of this occuring are extremely low.  By statistics, you have more of a chance of getting killed in a motor vehicle accident: while driving to the hospital than any of the above occurring .  Nevertheless, you should be aware that they are possibilities.  In general, it is similar to taking a shower.  Everybody knows that you can slip, hit your head and get killed.  Does that mean that you should not shower again?  Nevertheless always keep in mind that statistics do not mean anything if you happen to be on the wrong side of them.  Even if a procedure has a 1 (one) in a 1,000,000 (million) chance of going wrong, it you happen to be that one..Also, keep in mind that by statistics, you have more of a chance of having something go wrong when taking medications.  Who should not have this procedure? If you are on a blood thinning medication (e.g. Coumadin, Plavix, see list of "Blood Thinners"), or if you have an active infection going on, you should not have the procedure.  If you are taking any blood thinners, please inform  your physician.  How should I prepare for this procedure?  Do not eat or drink anything at least six hours prior to the procedure.  Bring a driver with you .  It cannot be a taxi.  Come accompanied by an adult that can drive you back, and that is strong enough to help you if your legs get weak or numb from  the local anesthetic.  Take all of your medicines the morning of the procedure with just enough water to swallow them.  If you have diabetes, make sure that you are scheduled to have your procedure done first thing in the morning, whenever possible.  If you have diabetes, take only half of your insulin dose and notify our nurse that you have done so as soon as you arrive at the clinic.  If you are diabetic, but only take blood sugar pills (oral hypoglycemic), then do not take them on the morning of your procedure.  You may take them after you have had the procedure.  Do not take aspirin or any aspirin-containing medications, at least eleven (11) days prior to the procedure.  They may prolong bleeding.  Wear loose fitting clothing that may be easy to take off and that you would not mind if it got stained with Betadine or blood.  Do not wear any jewelry or perfume  Remove any nail coloring.  It will interfere with some of our monitoring equipment.  NOTE: Remember that this is not meant to be interpreted as a complete list of all possible complications.  Unforeseen problems may occur.  BLOOD THINNERS The following drugs contain aspirin or other products, which can cause increased bleeding during surgery and should not be taken for 2 weeks prior to and 1 week after surgery.  If you should need take something for relief of minor pain, you may take acetaminophen which is found in Tylenol,m Datril, Anacin-3 and Panadol. It is not blood thinner. The products listed below are.  Do not take any of the products listed below in addition to any listed on your instruction sheet.  A.P.C or A.P.C  with Codeine Codeine Phosphate Capsules #3 Ibuprofen Ridaura  ABC compound Congesprin Imuran rimadil  Advil Cope Indocin Robaxisal  Alka-Seltzer Effervescent Pain Reliever and Antacid Coricidin or Coricidin-D  Indomethacin Rufen  Alka-Seltzer plus Cold Medicine Cosprin Ketoprofen S-A-C Tablets  Anacin Analgesic Tablets or Capsules Coumadin Korlgesic Salflex  Anacin Extra Strength Analgesic tablets or capsules CP-2 Tablets Lanoril Salicylate  Anaprox Cuprimine Capsules Levenox Salocol  Anexsia-D Dalteparin Magan Salsalate  Anodynos Darvon compound Magnesium Salicylate Sine-off  Ansaid Dasin Capsules Magsal Sodium Salicylate  Anturane Depen Capsules Marnal Soma  APF Arthritis pain formula Dewitt's Pills Measurin Stanback  Argesic Dia-Gesic Meclofenamic Sulfinpyrazone  Arthritis Bayer Timed Release Aspirin Diclofenac Meclomen Sulindac  Arthritis pain formula Anacin Dicumarol Medipren Supac  Analgesic (Safety coated) Arthralgen Diffunasal Mefanamic Suprofen  Arthritis Strength Bufferin Dihydrocodeine Mepro Compound Suprol  Arthropan liquid Dopirydamole Methcarbomol with Aspirin Synalgos  ASA tablets/Enseals Disalcid Micrainin Tagament  Ascriptin Doan's Midol Talwin  Ascriptin A/D Dolene Mobidin Tanderil  Ascriptin Extra Strength Dolobid Moblgesic Ticlid  Ascriptin with Codeine Doloprin or Doloprin with Codeine Momentum Tolectin  Asperbuf Duoprin Mono-gesic Trendar  Aspergum Duradyne Motrin or Motrin IB Triminicin  Aspirin plain, buffered or enteric coated Durasal Myochrisine Trigesic  Aspirin Suppositories Easprin Nalfon Trillsate  Aspirin with Codeine Ecotrin Regular or Extra Strength Naprosyn Uracel  Atromid-S Efficin Naproxen Ursinus  Auranofin Capsules Elmiron Neocylate Vanquish  Axotal Emagrin Norgesic Verin  Azathioprine Empirin or Empirin with Codeine Normiflo Vitamin E  Azolid Emprazil Nuprin Voltaren  Bayer Aspirin plain, buffered or children's or timed BC Tablets or powders  Encaprin Orgaran Warfarin Sodium  Buff-a-Comp Enoxaparin Orudis Zorpin  Buff-a-Comp with Codeine Equegesic Os-Cal-Gesic   Buffaprin Excedrin plain, buffered or Extra Strength Oxalid   Bufferin Arthritis Strength Feldene Oxphenbutazone   Bufferin plain  or Extra Strength Feldene Capsules Oxycodone with Aspirin   Bufferin with Codeine Fenoprofen Fenoprofen Pabalate or Pabalate-SF   Buffets II Flogesic Panagesic   Buffinol plain or Extra Strength Florinal or Florinal with Codeine Panwarfarin   Buf-Tabs Flurbiprofen Penicillamine   Butalbital Compound Four-way cold tablets Penicillin   Butazolidin Fragmin Pepto-Bismol   Carbenicillin Geminisyn Percodan   Carna Arthritis Reliever Geopen Persantine   Carprofen Gold's salt Persistin   Chloramphenicol Goody's Phenylbutazone   Chloromycetin Haltrain Piroxlcam   Clmetidine heparin Plaquenil   Cllnoril Hyco-pap Ponstel   Clofibrate Hydroxy chloroquine Propoxyphen         Before stopping any of these medications, be sure to consult the physician who ordered them.  Some, such as Coumadin (Warfarin) are ordered to prevent or treat serious conditions such as "deep thrombosis", "pumonary embolisms", and other heart problems.  The amount of time that you may need off of the medication may also vary with the medication and the reason for which you were taking it.  If you are taking any of these medications, please make sure you notify your pain physician before you undergo any procedures.         Epidural Steroid Injection Patient Information  Description: The epidural space surrounds the nerves as they exit the spinal cord.  In some patients, the nerves can be compressed and inflamed by a bulging disc or a tight spinal canal (spinal stenosis).  By injecting steroids into the epidural space, we can bring irritated nerves into direct contact with a potentially helpful medication.  These steroids act directly on the irritated nerves and can reduce  swelling and inflammation which often leads to decreased pain.  Epidural steroids may be injected anywhere along the spine and from the neck to the low back depending upon the location of your pain.   After numbing the skin with local anesthetic (like Novocaine), a small needle is passed into the epidural space slowly.  You may experience a sensation of pressure while this is being done.  The entire block usually last less than 10 minutes.  Conditions which may be treated by epidural steroids:   Low back and leg pain  Neck and arm pain  Spinal stenosis  Post-laminectomy syndrome  Herpes zoster (shingles) pain  Pain from compression fractures  Preparation for the injection:  5. Do not eat any solid food or dairy products within 6 hours of your appointment.  6. You may drink clear liquids up to 2 hours before appointment.  Clear liquids include water, black coffee, juice or soda.  No milk or cream please. 7. You may take your regular medication, including pain medications, with a sip of water before your appointment  Diabetics should hold regular insulin (if taken separately) and take 1/2 normal NPH dos the morning of the procedure.  Carry some sugar containing items with you to your appointment. 8. A driver must accompany you and be prepared to drive you home after your procedure.  9. Bring all your current medications with your. 10. An IV may be inserted and sedation may be given at the discretion of the physician.   11. A blood pressure cuff, EKG and other monitors will often be applied during the procedure.  Some patients may need to have extra oxygen administered for a short period. 12. You will be asked to provide medical information, including your allergies, prior to the procedure.  We must know immediately if you are taking blood thinners (like Coumadin/Warfarin)  Or if  you are allergic to IV iodine contrast (dye). We must know if you could possible be pregnant.  Possible  side-effects:  Bleeding from needle site  Infection (rare, may require surgery)  Nerve injury (rare)  Numbness & tingling (temporary)  Difficulty urinating (rare, temporary)  Spinal headache ( a headache worse with upright posture)  Light -headedness (temporary)  Pain at injection site (several days)  Decreased blood pressure (temporary)  Weakness in arm/leg (temporary)  Pressure sensation in back/neck (temporary)  Call if you experience:  Fever/chills associated with headache or increased back/neck pain.  Headache worsened by an upright position.  New onset weakness or numbness of an extremity below the injection site  Hives or difficulty breathing (go to the emergency room)  Inflammation or drainage at the infection site  Severe back/neck pain  Any new symptoms which are concerning to you  Please note:  Although the local anesthetic injected can often make your back or neck feel good for several hours after the injection, the pain will likely return.  It takes 3-7 days for steroids to work in the epidural space.  You may not notice any pain relief for at least that one week.  If effective, we will often do a series of three injections spaced 3-6 weeks apart to maximally decrease your pain.  After the initial series, we generally will wait several months before considering a repeat injection of the same type.  If you have any questions, please call [phone] [facility]

## 2015-04-05 NOTE — Progress Notes (Signed)
Patient's Name: Mary Cruz MRN: [id number] DOB: 1975-07-11 DOS: 04/05/2015  Primary Reason(s) for Visit: Encounter for Medication Management CC: Neck Pain; Hip Pain; Leg Pain; and Back Pain   HPI  Ms. Rohlik is a 40 y.o. year old, female patient, who returns today as an established patient. She has GERD; IRRITABLE BOWEL SYNDROME; KIDNEY STONES; Essential hypertension, benign; Chronic pain syndrome; Sepsis affecting skin; Abscess of axillary fold; Generalized anxiety disorder; Obesity; Chronic pain; Long term current use of opiate analgesic; Long term prescription opiate use; Opiate use; Opiate dependence ([place]); Encounter for therapeutic drug level monitoring; Musculoskeletal pain; Muscle cramping; Osteoarthrosis; Vitamin D insufficiency; Presence of functional implant ([employer] Lumbar spinal cord stimulator implant); Encounter for management of implanted device; Chronic low back pain (R>L); Chronic lumbar radicular pain (Right L5 dermatome; Left S1 Dermatome) (Bilateral) (R>L); Chronic hip pain (Location of Tertiary source of pain) (Bilateral) (R>L); Chronic neck pain (Location of Primary Source of Pain) (Bilateral) (L>R); Chronic upper extremity pain (Location of Secondary source of pain) (Bilateral) (L>R); Morbid obesity ([place]); Nicotine dependence; Chronic cervical radicular pain (C7 Dermatome) (Bilateral) (L>R); Chronic lower extremity pain (Bilateral) (R>L); Right knee pain; Anxiety and depression; Acute bronchitis with asthma with acute exacerbation; and Implant pain at battery site (right buttocks area) on her problem list.. Her primarily concern today is the Neck Pain; Hip Pain; Leg Pain; and Back Pain   The patient comes in today clinics today for pharmacological management of her chronic pain. Today she indicates her primary pain to be in the area of the neck with the left side being worst on the right. This is followed by pain in the upper extremities or the left side is worse than the  right. In the case of the left upper extremity the pain goals to the middle 3 fingers where she is experiencing pain and numbness. The same is true for the right upper extremity but to a lesser extent. Her third area of pain is the hips with the right being worst on the left. This is followed by the lower extremity pain where again the right is worse than the left. In the case of the right lower extremity the pain goes from the hip to the top of the foot in what seems to be an L5 dermatomal distribution. In the case of the left lower extremity the pain goes down the lateral aspect of the leg to the bottom of the foot following an S1 dermatomal distribution. Her fifth source of pain is the battery of the spinal cord stimulator. Inspection of the area shows that the device is not close to the skin and the pain is actually more proximal and superior to the device over the area of the superior cluneal nerve on the right side.  Today the patient has requested that we schedule her for a left-sided cervical epidural steroid injection under fluoroscopic guidance and IV sedation. Today we'll also discontinue the use of the [place] and start her on tizanidine 2 mg by mouth every 8 hours.  After we get her cervical pain under control, the plan is to go back and do a right-sided L2 & L3, medial branch block, in order to block the origins of the right superior Cluneal nerve. Should the patient get good relief of the pain with this diagnostic injection, we will then follow up with radiofrequency.  Reported Pain Score: 6 , clinically she looks like a 2-3/10. Reported level is inconsistent with clinical obrservations. Pain Type: Chronic pain Pain Location: Neck  Pain Descriptors / Indicators: Aching, Constant, Radiating Pain Frequency: Constant  Date of Last Visit: 12/19/14 Service Provided on Last Visit: Med Refill  Controlled Substance Pharmacotherapy Assessment  Analgesic: Oxycodone IR 5 mg every 6 hours when  necessary for pain. (20 mg/day) MME/day: 30 mg/day Pharmacokinetics: Onset of action (Liberation/Absorption): Within expected pharmacological parameters. (30-45 minutes) Time to Peak effect (Distribution): Timing and results are as within normal expected parameters. (One hour) Duration of action (Metabolism/Excretion): Within normal limits for medication. (4-5 hours) Pharmacodynamics: Analgesic Effect: 80% Activity Facilitation: Medication(s) allow patient to sit, stand, walk, and do the basic ADLs Perceived Effectiveness: Described as relatively effective, allowing for increase in activities of daily living (ADL) Side-effects or Adverse reactions: None reported Monitoring: [place] PMP: Compliant with practice rules and regulations UDS Results/interpretation: The patient's last UDS was done on 12/19/2014 and it came back with unexpected results. However, this did not seem to be significant. One of the unexpected results was the fact that the patient hadn't declare taking clonazepam and it did not show up in the sample. The second unexpected result was the lack of oxycodone which was not detected in the sample however its metabolites were present. This would suggest that the patient remains compliant. Medication Assessment Form: Reviewed. Patient indicates being compliant with therapy Treatment compliance: Compliant Risk Assessment: Substance Use Disorder (SUD) Risk Level: Low Opioid Risk Tool (ORT) Score: Total Score: 0 Depression Scale Score:    Pharmacologic Plan: Continue therapy as is  Lab Work: Illicit Drugs No results found for: THCU, COCAINSCRNUR, PCPSCRNUR, MDMA, AMPHETMU, METHADONE, ETOH  Inflammation Markers Lab Results  Component Value Date   ESRSEDRATE 35* 04/05/2015   CRP 1.0* 04/05/2015    Renal Function Lab Results  Component Value Date   BUN 12 04/05/2015   CREATININE 0.97 04/05/2015   GFRAA >60 04/05/2015   GFRNONAA >60 04/05/2015    Hepatic Function Lab  Results  Component Value Date   AST 20 04/05/2015   ALT 21 04/05/2015   ALBUMIN 4.1 04/05/2015    Electrolytes Lab Results  Component Value Date   NA 141 04/05/2015   K 3.1* 04/05/2015   CL 103 04/05/2015   CALCIUM 9.2 04/05/2015   MG 2.0 04/05/2015    Allergies  Ms. Nila is allergic to amoxicillin; bactrim; celebrex; doxycycline; erythromycin; hydromorphone hcl; lyrica; nabumetone; and pentazocine lactate.  Meds  The patient has a current medication list which includes the following prescription(s): amitriptyline, vitamin d3, clonazepam, duloxetine, furosemide, lisinopril-hydrochlorothiazide, metformin, omeprazole, oxycodone-acetaminophen, oxycodone, oxycodone, oxycodone, and tizanidine.  Current Outpatient Prescriptions on File Prior to Visit  Medication Sig  . amitriptyline (ELAVIL) 100 MG tablet Take 1 tablet (100 mg total) by mouth at bedtime.  . clonazePAM (KLONOPIN) 0.5 MG tablet Take 0.5-1 tablets (0.25-0.5 mg total) by mouth 2 (two) times daily as needed for anxiety.  . DULoxetine (CYMBALTA) 30 MG capsule Take 1 capsule (30 mg total) by mouth daily.  . furosemide (LASIX) 20 MG tablet Take 1-2 tablets (20-40 mg total) by mouth daily as needed for fluid.  [name] lisinopril-hydrochlorothiazide (PRINZIDE,ZESTORETIC) 10-12.5 MG tablet Take 1 tablet by mouth daily.  . metFORMIN (GLUCOPHAGE) 500 MG tablet Take by mouth 2 (two) times daily with a meal.  . omeprazole (PRILOSEC) 40 MG capsule TAKE ONE CAPSULE twice a day.  . oxyCODONE-acetaminophen (PERCOCET/ROXICET) 5-325 MG tablet Take 1 tablet by mouth every 8 (eight) hours as needed for severe pain.   No current facility-administered medications on file prior to visit.    ROS  Constitutional: Afebrile, no chills, well hydrated and well nourished Gastrointestinal: negative Musculoskeletal:negative Neurological: negative Behavioral/Psych: negative  [place]  Medical:  Ms. Odonohue  has a past medical history of Osteoarthritis;  Hypertension; Pain; and Fibromyalgia. Family: family history includes Asthma in her mother; Depression in her mother. Surgical:  has past surgical history that includes spinal cord stimulator; Occipital nerve stimulator insertion; Cholecystectomy (2000); Knee surgery; Shoulder surgery; Tubal ligation (2000); Endometrial ablation; Spinal cord stimulator implant (2008); and Knee surgery (Right, 02-2015). Tobacco:  reports that she has been smoking.  She does not have any smokeless tobacco history on file. Alcohol:  reports that she does not drink alcohol. Drug:  reports that she does not use illicit drugs.  Physical Exam  Vitals:  Today's Vitals   04/05/15 1131 04/05/15 1135  BP:  125/66  Pulse: 96   Temp: 98.1 F (36.7 C)   Resp: 18   Height: 5\' 4"  (1.626 m)   Weight: 220 lb (99.791 kg)   SpO2: 100%   PainSc: 6  6   PainLoc: Neck     Calculated BMI: Body mass index is 37.74 kg/(m^2).  General appearance: alert, cooperative, appears stated age, mild distress and moderately obese Eyes: PERLA Respiratory: No evidence respiratory distress, no audible rales or ronchi and no use of accessory muscles of respiration  Cervical Spine Inspection: Normal anatomy Alignment: Symetrical ROM: Decreased  Upper Extremities Inspection: No gross anomalies detected ROM: Adequate Sensory: Normal Motor: Unremarkable  Thoracic Spine Inspection: No gross anomalies detected Alignment: Symetrical ROM: Adequate  Lumbar Spine Inspection: No gross anomalies detected Alignment: Symetrical ROM: Adequate  Gait: WNL  Lower Extremities Inspection: No gross anomalies detected ROM: Adequate Sensory:  Normal Motor: Unremarkable  Assessment & Plan  Primary Diagnosis & Pertinent Problem List: The primary encounter diagnosis was Chronic pain. Diagnoses of Encounter for therapeutic drug level monitoring, Long term current use of opiate analgesic, Chronic low back pain (R>L), Chronic neck pain,  Chronic radicular cervical pain (Bilateral) (L>R), Muscle cramping, and Implant pain at battery site (right buttocks area) were also pertinent to this visit.  Visit Diagnosis: 1. Chronic pain   2. Encounter for therapeutic drug level monitoring   3. Long term current use of opiate analgesic   4. Chronic low back pain (R>L)   5. Chronic neck pain   6. Chronic radicular cervical pain (Bilateral) (L>R)   7. Muscle cramping   8. Implant pain at battery site (right buttocks area)     Problem-specific Plan(s): No problem-specific assessment & plan notes found for this encounter.   Plan of Care  Pharmacotherapy (Medications Ordered): Meds ordered this encounter  Medications  . oxyCODONE (OXY IR/ROXICODONE) 5 MG immediate release tablet    Sig: Take 1 tablet (5 mg total) by mouth every 6 (six) hours as needed for moderate pain or severe pain.    Dispense:  120 tablet    Refill:  0    Do not place this medication, or any other prescription from our practice, on "Automatic Refill". Patient may have prescription filled one day early if pharmacy is closed on scheduled refill date. Do not fill until: 04/05/15 To last until: 05/05/15  . tizanidine (ZANAFLEX) 2 MG capsule    Sig: Take 1 capsule (2 mg total) by mouth 3 (three) times daily as needed for muscle spasms.    Dispense:  90 capsule    Refill:  2    Do not place this medication, or any other prescription from our practice, on "  Automatic Refill". Patient may have prescription filled one day early if pharmacy is closed on scheduled refill date.  [name] oxyCODONE (OXY IR/ROXICODONE) 5 MG immediate release tablet    Sig: Take 1 tablet (5 mg total) by mouth every 6 (six) hours as needed for moderate pain or severe pain.    Dispense:  120 tablet    Refill:  0    Do not place this medication, or any other prescription from our practice, on "Automatic Refill". Patient may have prescription filled one day early if pharmacy is closed on scheduled  refill date. Do not fill until: 05/05/15 To last until: 06/04/15  . oxyCODONE (OXY IR/ROXICODONE) 5 MG immediate release tablet    Sig: Take 1 tablet (5 mg total) by mouth every 6 (six) hours as needed for moderate pain or severe pain.    Dispense:  120 tablet    Refill:  0    Do not place this medication, or any other prescription from our practice, on "Automatic Refill". Patient may have prescription filled one day early if pharmacy is closed on scheduled refill date. Do not fill until: 06/04/15 To last until: 07/04/15    [facility] & Procedure Ordered: Orders Placed This Encounter  Procedures  . CERVICAL EPIDURAL STEROID INJECTION    Standing Status: Future     Number of Occurrences:      Standing Expiration Date: 04/04/2016    Scheduling Instructions:     Side: Left-sided     Sedation: With Sedation.     Timeframe: ASAP    Order Specific Question:  Where will this procedure be performed?    Answer:  [facility]  . ToxASSURE Select 13 (MW), Urine    Volume: 30 ml(s). Minimum 3 ml of urine is needed. Document temperature of fresh sample. Indications: Long term (current) use of opiate analgesic (Z79.891)  . Comprehensive metabolic panel    Order Specific Question:  Has the patient fasted?    Answer:  No  . C-reactive protein  . Magnesium  . Sedimentation rate  . Vitamin B12    Indication: Bone Pain (M89.9)  . Vitamin D pnl(25-hydrxy+1,25-dihy)-bld    Imaging Ordered: None  Interventional Therapies: Scheduled: Palliative, left sided, cervical epidural steroid injection under fluoroscopic guidance and IV sedation. PRN Procedures: Possible right L2 and L3 medial branch block for the purpose of blocking the sensation to the superior cluneal nerve around the area of the spinal cord simulator battery.    Referral(s) or Consult(s): None at this time.  Medications administered during this visit: Ms. Reifsteck had no medications administered during this visit.  Future  Appointments Date Time Provider [place]  04/13/2015 10:40 AM Milinda Pointer, MD [facility] None    Primary Care Physician: Marcial Pacas, DO Location: [facility] Out[facility] Facility Note by: Kathlen Brunswick. Dossie Arbour, M.D, DABA, DABAPM, DABPM, DABIPP, FIPP

## 2015-04-05 NOTE — Progress Notes (Signed)
Safety precautions to be maintained throughout the outpatient stay will include: orient to surroundings, keep bed in low position, maintain call bell within reach at all times, provide assistance with transfer out of bed and ambulation. Percocet pill count # 6/90  Filled 03-07-15

## 2015-04-11 NOTE — Progress Notes (Signed)
Quick Note:   Potassium levels below 3.6 mmol/L are considered to be low. Levels (less than 2.5 mmol/L) can be life-threatening and requires urgent medical attention. Low potassium (hypokalemia) has many causes. The most common cause is excessive potassium loss in urine due to prescription water or fluid pills (diuretics). Vomiting or diarrhea or both can result in excessive potassium loss from the digestive tract. Causes of potassium loss leading to low potassium include: chronic kidney disease; diabetic ketoacidosis; diarrhea; excessive alcohol use; excessive laxative use; excessive sweating; folic acid deficiency; diuretics; primary aldosteronism; vomiting; and/or some antibiotic use.  Patient may need replacement. Please notify PCP. ______

## 2015-04-11 NOTE — Progress Notes (Signed)

## 2015-04-11 NOTE — Progress Notes (Signed)
Quick Note:   Normal levels of C-Reactive Protein for our Lab are less than 1.0 mg/L. C-reactive protein (CRP) is produced by the liver. The level of CRP rises when there is inflammation throughout the body. CRP goes up in response to inflammation. High levels suggests the presence of chronic inflammation but do not identify its location or cause. High levels have been observed in obese patients, individuals with bacterial infections, chronic inflammation, or flare-ups of inflammatory conditions. Drops of previously elevated levels suggest that the inflammation or infection is subsiding and/or responding to treatment.  The combined elevation of the ESR & CRP, may be suggestive of an autoimmune disease. Should this be the case, we will inquire if the patient has had a rheumatologic evaluation looking at the RF levels, ANA levels, and CBC.  ______

## 2015-04-11 NOTE — Progress Notes (Signed)
Quick Note:  Lab results reviewed and found to be within normal limits. ______ 

## 2015-04-13 ENCOUNTER — Encounter: Payer: Self-pay | Admitting: Pain Medicine

## 2015-04-13 ENCOUNTER — Ambulatory Visit: Payer: BLUE CROSS/BLUE SHIELD | Attending: Pain Medicine | Admitting: Pain Medicine

## 2015-04-13 VITALS — BP 105/68 | HR 100 | Temp 98.0°F | Resp 16 | Ht 64.0 in | Wt 220.0 lb

## 2015-04-13 DIAGNOSIS — Z9689 Presence of other specified functional implants: Secondary | ICD-10-CM | POA: Diagnosis not present

## 2015-04-13 DIAGNOSIS — M79601 Pain in right arm: Secondary | ICD-10-CM | POA: Diagnosis not present

## 2015-04-13 DIAGNOSIS — M25551 Pain in right hip: Secondary | ICD-10-CM | POA: Diagnosis not present

## 2015-04-13 DIAGNOSIS — J209 Acute bronchitis, unspecified: Secondary | ICD-10-CM | POA: Insufficient documentation

## 2015-04-13 DIAGNOSIS — Z79891 Long term (current) use of opiate analgesic: Secondary | ICD-10-CM | POA: Diagnosis not present

## 2015-04-13 DIAGNOSIS — M542 Cervicalgia: Secondary | ICD-10-CM | POA: Diagnosis present

## 2015-04-13 DIAGNOSIS — E559 Vitamin D deficiency, unspecified: Secondary | ICD-10-CM | POA: Diagnosis not present

## 2015-04-13 DIAGNOSIS — K219 Gastro-esophageal reflux disease without esophagitis: Secondary | ICD-10-CM | POA: Insufficient documentation

## 2015-04-13 DIAGNOSIS — M545 Low back pain: Secondary | ICD-10-CM | POA: Diagnosis not present

## 2015-04-13 DIAGNOSIS — F17200 Nicotine dependence, unspecified, uncomplicated: Secondary | ICD-10-CM | POA: Diagnosis not present

## 2015-04-13 DIAGNOSIS — Z87442 Personal history of urinary calculi: Secondary | ICD-10-CM | POA: Insufficient documentation

## 2015-04-13 DIAGNOSIS — F419 Anxiety disorder, unspecified: Secondary | ICD-10-CM | POA: Insufficient documentation

## 2015-04-13 DIAGNOSIS — F329 Major depressive disorder, single episode, unspecified: Secondary | ICD-10-CM | POA: Diagnosis not present

## 2015-04-13 DIAGNOSIS — K589 Irritable bowel syndrome without diarrhea: Secondary | ICD-10-CM | POA: Diagnosis not present

## 2015-04-13 DIAGNOSIS — G8929 Other chronic pain: Secondary | ICD-10-CM | POA: Diagnosis not present

## 2015-04-13 DIAGNOSIS — M25552 Pain in left hip: Secondary | ICD-10-CM | POA: Insufficient documentation

## 2015-04-13 DIAGNOSIS — M5412 Radiculopathy, cervical region: Secondary | ICD-10-CM | POA: Insufficient documentation

## 2015-04-13 DIAGNOSIS — M1990 Unspecified osteoarthritis, unspecified site: Secondary | ICD-10-CM | POA: Diagnosis not present

## 2015-04-13 DIAGNOSIS — J45901 Unspecified asthma with (acute) exacerbation: Secondary | ICD-10-CM | POA: Diagnosis not present

## 2015-04-13 DIAGNOSIS — M791 Myalgia: Secondary | ICD-10-CM | POA: Diagnosis not present

## 2015-04-13 DIAGNOSIS — M79602 Pain in left arm: Secondary | ICD-10-CM | POA: Insufficient documentation

## 2015-04-13 DIAGNOSIS — M25561 Pain in right knee: Secondary | ICD-10-CM | POA: Diagnosis not present

## 2015-04-13 DIAGNOSIS — A419 Sepsis, unspecified organism: Secondary | ICD-10-CM | POA: Diagnosis not present

## 2015-04-13 DIAGNOSIS — I10 Essential (primary) hypertension: Secondary | ICD-10-CM | POA: Diagnosis not present

## 2015-04-13 LAB — TOXASSURE SELECT 13 (MW), URINE: PDF: 0

## 2015-04-13 MED ORDER — SODIUM CHLORIDE 0.9 % IJ SOLN
INTRAMUSCULAR | Status: AC
  Administered 2015-04-13: 12:00:00
  Filled 2015-04-13: qty 10

## 2015-04-13 MED ORDER — ROPIVACAINE HCL 2 MG/ML IJ SOLN
1.0000 mL | Freq: Once | INTRAMUSCULAR | Status: AC
  Administered 2015-04-13: 12:00:00 via EPIDURAL

## 2015-04-13 MED ORDER — FENTANYL CITRATE (PF) 100 MCG/2ML IJ SOLN
INTRAMUSCULAR | Status: AC
  Administered 2015-04-13: 100 ug via INTRAVENOUS
  Filled 2015-04-13: qty 2

## 2015-04-13 MED ORDER — LIDOCAINE HCL (PF) 1 % IJ SOLN
10.0000 mL | Freq: Once | INTRAMUSCULAR | Status: AC
  Administered 2015-04-13: 12:00:00 via INTRADERMAL

## 2015-04-13 MED ORDER — DEXAMETHASONE SODIUM PHOSPHATE 10 MG/ML IJ SOLN
10.0000 mg | Freq: Once | INTRAMUSCULAR | Status: AC
Start: 2015-04-13 — End: 2015-04-13
  Administered 2015-04-13: 11:00:00

## 2015-04-13 MED ORDER — ROPIVACAINE HCL 2 MG/ML IJ SOLN
INTRAMUSCULAR | Status: AC
  Filled 2015-04-13: qty 10

## 2015-04-13 MED ORDER — IOHEXOL 180 MG/ML  SOLN
5.0000 mL | Freq: Once | INTRAMUSCULAR | Status: AC | PRN
Start: 2015-04-13 — End: 2015-04-13
  Administered 2015-04-13: 11:00:00 via EPIDURAL

## 2015-04-13 MED ORDER — DEXAMETHASONE SODIUM PHOSPHATE 10 MG/ML IJ SOLN
INTRAMUSCULAR | Status: AC
  Filled 2015-04-13: qty 1

## 2015-04-13 MED ORDER — MIDAZOLAM HCL 5 MG/5ML IJ SOLN
INTRAMUSCULAR | Status: AC
  Administered 2015-04-13: 4 mg via INTRAVENOUS
  Filled 2015-04-13: qty 5

## 2015-04-13 MED ORDER — LACTATED RINGERS IV SOLN: 1000.0000 mL | INTRAVENOUS | Status: AC

## 2015-04-13 MED ORDER — LIDOCAINE HCL (PF) 1 % IJ SOLN
INTRAMUSCULAR | Status: AC
  Filled 2015-04-13: qty 5

## 2015-04-13 MED ORDER — MIDAZOLAM HCL 5 MG/5ML IJ SOLN
5.0000 mg | INTRAMUSCULAR | Status: AC
  Administered 2015-04-13: 4 mg via INTRAVENOUS

## 2015-04-13 MED ORDER — IOHEXOL 180 MG/ML  SOLN
INTRAMUSCULAR | Status: AC
  Filled 2015-04-13: qty 20

## 2015-04-13 MED ORDER — FENTANYL CITRATE (PF) 100 MCG/2ML IJ SOLN
100.0000 ug | INTRAMUSCULAR | Status: AC
  Administered 2015-04-13: 100 ug via INTRAVENOUS

## 2015-04-13 MED ORDER — SODIUM CHLORIDE 0.9% FLUSH: 1.0000 mL | Freq: Once | INTRAVENOUS | Status: DC

## 2015-04-13 NOTE — Patient Instructions (Addendum)
Safety precautions to be maintained throughout the outpatient stay will include: orient to surroundings, keep bed in low position, maintain call bell within reach at all times, provide assistance with transfer out of bed and ambulation.Pain Management Discharge Instructions  General Discharge Instructions :  If you need to reach your doctor call: Monday-Friday 8:00 am - 4:00 pm at [phone] or toll free [phone].  After clinic hours [phone] to have operator reach doctor.  Bring all of your medication bottles to all your appointments in the pain clinic.  To cancel or reschedule your appointment with Pain Management please remember to call 24 hours in advance to avoid a fee.  Refer to the educational materials which you have been given on: General Risks, I had my Procedure. Discharge Instructions, Post Sedation.  Post Procedure Instructions:  The drugs you were given will stay in your system until tomorrow, so for the next 24 hours you should not drive, make any legal decisions or drink any alcoholic beverages.  You may eat anything you prefer, but it is better to start with liquids then soups and crackers, and gradually work up to solid foods.  Please notify your doctor immediately if you have any unusual bleeding, trouble breathing or pain that is not related to your normal pain.  Depending on the type of procedure that was done, some parts of your body may feel week and/or numb.  This usually clears up by tonight or the next day.  Walk with the use of an assistive device or accompanied by an adult for the 24 hours.  You may use ice on the affected area for the first 24 hours.  Put ice in a Ziploc bag and cover with a towel and place against area 15 minutes on 15 minutes off.  You may switch to heat after 24 hours.Epidural Steroid Injection An epidural steroid injection is given to relieve pain in your neck, back, or legs that is caused by the irritation or swelling of a nerve  root. This procedure involves injecting a steroid and numbing medicine (anesthetic) into the epidural space. The epidural space is the space between the outer covering of your spinal cord and the bones that form your backbone (vertebra).  LET [facility] CARE PROVIDER KNOW ABOUT:   Any allergies you have.  All medicines you are taking, including vitamins, herbs, eye drops, creams, and over-the-counter medicines such as aspirin.  Previous problems you or members of your family have had with the use of anesthetics.  Any blood disorders or blood clotting disorders you have.  Previous surgeries you have had.  Medical conditions you have. RISKS AND COMPLICATIONS Generally, this is a safe procedure. However, as with any procedure, complications can occur. Possible complications of epidural steroid injection include:  Headache.  Bleeding.  Infection.  Allergic reaction to the medicines.  Damage to your nerves. The response to this procedure depends on the underlying cause of the pain and its duration. People who have long-term (chronic) pain are less likely to benefit from epidural steroids than are those people whose pain comes on strong and suddenly. BEFORE THE PROCEDURE   Ask your health care provider about changing or stopping your regular medicines. You may be advised to stop taking blood-thinning medicines a few days before the procedure.  You may be given medicines to reduce anxiety.  Arrange for someone to take you home after the procedure. PROCEDURE   You will remain awake during the procedure. You may receive medicine to make you  relaxed.  You will be asked to lie on your stomach.  The injection site will be cleaned.  The injection site will be numbed with a medicine (local anesthetic).  A needle will be injected through your skin into the epidural space.  Your health care provider will use an X-ray machine to ensure that the steroid is delivered closest to the  affected nerve. You may have minimal discomfort at this time.  Once the needle is in the right position, the local anesthetic and the steroid will be injected into the epidural space.  The needle will then be removed and a bandage will be applied to the injection site. AFTER THE PROCEDURE   You may be monitored for a short time before you go home.  You may feel weakness or numbness in your arm or leg, which disappears within hours.  You may be allowed to eat, drink, and take your regular medicine.  You may have soreness at the site of the injection.   This information is not intended to replace advice given to you by your health care provider. Make sure you discuss any questions you have with your health care provider.   Document Released: 05/07/2007 Document Revised: 09/30/2012 Document Reviewed: 07/17/2012 Elsevier Interactive Patient Education [employer].

## 2015-04-13 NOTE — Progress Notes (Signed)
Patient's Name: Mary Cruz MRN: [id number] DOB: January 20, 1976 DOS: 04/13/2015  Primary Reason(s) for Visit: Interventional Pain Management Treatment. CC: Neck Pain   Pre-Procedure Assessment:  Mary Cruz is a 40 y.o. year old, female patient, seen today for interventional treatment. 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); Encounter for management of implanted device; Chronic low back pain (R>L); Chronic lumbar radicular pain (Right L5 dermatome; Left S1 Dermatome) (Bilateral) (R>L); Chronic hip pain (Location of Tertiary source of pain) (Bilateral) (R>L); Chronic neck pain (Location of Primary Source of Pain) (Bilateral) (L>R); Chronic upper extremity pain (Location of Secondary source of pain) (Bilateral) (L>R); Morbid obesity ([place]); Nicotine dependence; Chronic cervical radicular pain (C7 Dermatome) (Bilateral) (L>R); Chronic lower extremity pain (Bilateral) (R>L); Right knee pain; Anxiety and depression; Acute bronchitis with asthma with acute exacerbation; and Implant pain at battery site (right buttocks area) on her problem list.. Her primarily concern today is the Neck Pain   Today's Initial Pain Score: 6/10 Reported level of pain is incompatible with clinical obrservations. This may be secondary to a possible lack of understanding on how the pain scale works. Pain Type: Chronic pain Pain Location: Neck Pain Orientation: Right, Left Pain Descriptors / Indicators: Aching, Constant, Radiating Pain  Frequency: Constant  Post-procedure Pain Score: 0-No pain  Date of Last Visit: 04/05/15 Service Provided on Last Visit: Med Refill  Verification of the correct person, correct site (including marking of site), and correct procedure were performed and confirmed by the patient.  Today's Vitals   04/13/15 1132 04/13/15 1142 04/13/15 1152 04/13/15 1202  BP: 123/94 99/66 101/60 105/68  Pulse: 95 99 100 100  Temp:      Resp: '10 16 16 16  '$ Height:      Weight:      SpO2: 95% 99% 100% 0%  PainSc: 1  0-No pain 0-No pain   PainLoc:      Calculated BMI: Body mass index is 37.74 kg/(m^2). Allergies: She is allergic to amoxicillin; bactrim; celebrex; doxycycline; erythromycin; hydromorphone hcl; lyrica; nabumetone; and pentazocine lactate.. Primary Diagnosis: Chronic radicular cervical pain [M54.12, G89.29]  Procedure:  Type: Diagnostic, Inter-Laminar, Epidural Steroid Injection Region: Posterior Cervico-thoracic Region Level: C7-T1 Laterality: Left-Sided Paramedial  Indications: 1. Chronic cervical radicular pain (C7 Dermatome) (Bilateral) (L>R)   2. Chronic neck pain (Location of Primary Source of Pain) (Bilateral) (L>R)   3. 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  Consent: Secured. Under the influence of no sedatives a written informed consent was obtained, after having provided information  on the risks and possible complications. To fulfill our ethical and legal obligations, as recommended by the American Medical Association's Code of Ethics, we have provided information to the patient about our clinical impression; the nature and purpose of the treatment or procedure; the risks, benefits, and possible complications of the intervention; alternatives; the risk(s) and benefit(s) of the alternative treatment(s) or procedure(s); and the risk(s) and benefit(s) of doing nothing. The patient was provided information about the risks and possible complications associated with the procedure. In the case of spinal procedures these may include, but are not limited to, failure to achieve desired goals, infection, bleeding, organ or nerve damage, allergic reactions, paralysis, and death. In addition, the patient was informed that Medicine is not an exact science; therefore, there is also the possibility of unforeseen risks and possible complications that may result in a catastrophic outcome. The patient indicated having understood very clearly. We have given the patient no guarantees and we have made no promises. Enough time was given to the patient to ask questions, all of which were answered to the patient's satisfaction.  Pre-Procedure Preparation: Safety Precautions: Allergies reviewed. Appropriate site, procedure, and patient were confirmed by following the Joint Commission's Universal Protocol (UP.01.01.01), in the form of a "Time Out". The patient was asked to confirm marked site and procedure, before commencing. The patient was asked about blood thinners, or active infections, both of which were denied. Patient was assessed for positional comfort and all pressure points were checked before starting procedure. Monitoring:  As per clinic protocol.  Blood pressure monitor, ECG, Pulse Oxymetry, & Clinical Monitoring. Infection Control Precautions: Sterile technique used. Standard Universal Precautions were taken as recommended by the Department of [facility] for Disease Control and Prevention (CDC). Standard pre-surgical skin prep was conducted. Respiratory hygiene and cough etiquette was practiced. Hand hygiene observed. Safe injection practices and needle disposal techniques followed. SDV (single dose vial) medications used. Medications properly checked for expiration dates and contaminants. Personal protective equipment (PPE) used: Surgical mask. Sterile double glove technique. Radiation resistant gloves. Sterile surgical gloves.  Anesthesia, Analgesia, Anxiolysis:  Type: Moderate (Conscious) Sedation & Local Anesthesia Local Anesthetic: Lidocaine 1% Route: Intravenous (IV) IV Access: Secured Sedation: Meaningful verbal contact was maintained at all times during the procedure  Indication(s): Analgesia & Anxiolysis  Description of Procedure Process:  Time-out: "Time-out" completed before starting procedure, as per protocol. Position: Prone with head of the table was raised to facilitate breathing. Target Area: For Epidural Steroid injections the target is the interlaminar space, initially targeting the lower border of the superior vertebral body lamina. Approach: Paramedial approach. Area Prepped: Entire PosteriorCervical Region Prepping solution: ChloraPrep (2% chlorhexidine gluconate and 70% isopropyl alcohol) Safety Precautions: Aspiration looking for blood return was conducted prior to all injections. At no point did we inject any substances, as a needle was being advanced. No attempts were made at seeking any paresthesias. Safe injection practices and needle disposal techniques used. Medications properly checked for expiration dates. SDV (single dose vial) medications used.   Description of the Procedure: Protocol guidelines  were followed. The procedure needle was introduced through the skin, ipsilateral to the reported pain, and advanced to the target area. Bone was contacted and the needle walked caudad, until the lamina was cleared. The epidural space was identified using "loss-of-resistance technique" with 2-3 ml of PF-NaCl (0.9% NSS), in a 5cc LOR glass syringe. EBL: None Materials & Medications Used:  Needle(s) Used: 20g - 10cm, Tuohy-style epidural needle Medication(s): see below.  Imaging Guidance:  Type of Imaging Technique: Fluoroscopy Guidance (Spinal) Indication(s): Assistance in needle guidance and placement for procedures requiring needle placement in or near specific anatomical locations not easily accessible without such assistance. Exposure Time: Please see nurses notes. Contrast: Before injecting any contrast, we confirmed that the patient did not have an allergy to iodine, shellfish, or radiological contrast. Once satisfactory needle placement was completed at the desired level, radiological contrast was injected. Injection was conducted under continuous fluoroscopic guidance. Injection of contrast accomplished without complications. See chart for type and volume of contrast used. Fluoroscopic Guidance: I was personally present in the fluoroscopy suite, where the patient was placed in position for the procedure, over the fluoroscopy-compatible table. Fluoroscopy was manipulated, using "Tunnel Vision Technique", to obtain the best possible view of the target area, on the affected side. Parallax error was corrected before commencing the procedure. A "direction-depth-direction" technique was used to introduce the needle under continuous pulsed fluoroscopic guidance. Once the target was reached, antero-posterior, oblique, and lateral fluoroscopic projection views were taken to confirm needle placement in all planes. Permanently recorded images stored by scanning into EMR. Interpretation: Intraoperative imaging  interpretation by performing Physician. Adequate needle placement confirmed. Needle placement confirmed in AP & Oblique Views. Appropriate spread of contrast to desired area. No evidence of afferent or efferent intravascular uptake. No intrathecal or subarachnoid spread observed. Permanent hardcopy images in multiple planes scanned into the patient's record.  Antibiotic Prophylaxis:  Indication(s): No indications identified. Type:  Antibiotics Given (last 72 hours)    None       Post-operative Assessment:  Complications: No immediate post-treatment complications observed. Disposition: The patient was discharged home, once institutional criteria were met. Return to clinic in 2 weeks for follow-up evaluation and interpretation of results. The patient tolerated the entire procedure well. A repeat set of vitals were taken after the procedure and the patient was kept under observation following institutional policy, for this type of procedure. Post-procedural neurological assessment was performed, showing return to baseline, prior to discharge. The patient was provided with post-procedure discharge instructions, including a section on how to identify potential problems. Should any problems arise concerning this procedure, the patient was given instructions to immediately contact us, at any time, without hesitation. In any case, we plan to contact the patient by telephone for a follow-up status report regarding this interventional procedure. Comments:  No additional relevant information.  Medications administered during this visit: We administered fentaNYL, midazolam, dexamethasone, iohexol, lidocaine (PF), ropivacaine (PF) 2 mg/ml (0.2%), ropivacaine (PF) 2 mg/ml (0.2%), iohexol, sodium chloride, dexamethasone, midazolam, fentaNYL, and lidocaine (PF).  Future Appointments Date Time Provider Joy  05/03/2015 9:00 AM Milinda Pointer, MD [facility] None    Primary Care Physician: Marcial Pacas, DO Location: [facility] Note by: Kathlen Brunswick. Dossie Arbour, M.D, DABA, DABAPM, DABPM, DABIPP, FIPP  Disclaimer:  Medicine is not an exact science. The only guarantee in medicine is that nothing is guaranteed. It is important to note that the decision to proceed with this intervention was based on the information collected from the patient. The Data and conclusions were drawn from the patient's questionnaire, the interview, and the physical examination. Because the information was provided in large part by the patient, it cannot be guaranteed that it has not been purposely or unconsciously manipulated. Every effort has been made to obtain as much relevant data as possible for this evaluation. It is important to note that the conclusions that lead to this procedure are derived  in large part from the available data. Always take into account that the treatment will also be dependent on availability of resources and existing treatment guidelines, considered by other Pain Management Practitioners as being common knowledge and practice, at the time of the intervention. For Medico-Legal purposes, it is also important to point out that variation in procedural techniques and pharmacological choices are the acceptable norm. The indications, contraindications, technique, and results of the above procedure should only be interpreted and judged by a Board-Certified Interventional Pain Specialist with extensive familiarity and expertise in the same exact procedure and technique. Attempts at providing opinions without similar or greater experience and expertise than that of the treating physician will be considered as inappropriate and unethical, and shall result in a formal complaint to the state medical board and applicable specialty societies.

## 2015-04-13 NOTE — Progress Notes (Signed)
Safety precautions to be maintained throughout the outpatient stay will include: orient to surroundings, keep bed in low position, maintain call bell within reach at all times, provide assistance with transfer out of bed and ambulation.  

## 2015-04-14 ENCOUNTER — Telehealth: Payer: Self-pay | Admitting: *Deleted

## 2015-04-14 NOTE — Telephone Encounter (Signed)
Patient doing well; had no pain for the first 4 hours and "this morning the pain is about a 3/10".

## 2015-04-23 ENCOUNTER — Other Ambulatory Visit: Payer: Self-pay | Admitting: Family Medicine

## 2015-04-28 ENCOUNTER — Other Ambulatory Visit: Payer: Self-pay | Admitting: Family Medicine

## 2015-05-03 ENCOUNTER — Encounter: Payer: BLUE CROSS/BLUE SHIELD | Admitting: Pain Medicine

## 2015-05-05 ENCOUNTER — Encounter: Payer: Self-pay | Admitting: Family Medicine

## 2015-05-05 ENCOUNTER — Ambulatory Visit (INDEPENDENT_AMBULATORY_CARE_PROVIDER_SITE_OTHER): Payer: BLUE CROSS/BLUE SHIELD | Admitting: Family Medicine

## 2015-05-05 VITALS — BP 122/81 | HR 96 | Wt 237.0 lb

## 2015-05-05 DIAGNOSIS — F419 Anxiety disorder, unspecified: Principal | ICD-10-CM

## 2015-05-05 DIAGNOSIS — F411 Generalized anxiety disorder: Secondary | ICD-10-CM | POA: Diagnosis not present

## 2015-05-05 DIAGNOSIS — F32A Depression, unspecified: Secondary | ICD-10-CM

## 2015-05-05 DIAGNOSIS — I10 Essential (primary) hypertension: Secondary | ICD-10-CM

## 2015-05-05 DIAGNOSIS — F418 Other specified anxiety disorders: Secondary | ICD-10-CM

## 2015-05-05 DIAGNOSIS — F329 Major depressive disorder, single episode, unspecified: Secondary | ICD-10-CM

## 2015-05-05 MED ORDER — DULOXETINE HCL 60 MG PO CPEP: 60.0000 mg | ORAL_CAPSULE | Freq: Every day | ORAL | Status: DC

## 2015-05-05 MED ORDER — LISINOPRIL-HYDROCHLOROTHIAZIDE 10-12.5 MG PO TABS: 1.0000 | ORAL_TABLET | Freq: Every day | ORAL | Status: DC

## 2015-05-05 MED ORDER — CLONAZEPAM 0.5 MG PO TABS
0.2500 mg | ORAL_TABLET | Freq: Two times a day (BID) | ORAL | Status: DC | PRN
Start: 2015-05-05 — End: 2016-03-04

## 2015-05-05 NOTE — Progress Notes (Signed)
CC: Mary Cruz is a 40 y.o. female is here for Medication Management and Medication Refill   Subjective: HPI:  Follow-up anxiety and depression: She tells me that she doesn't feel depressed at all but she has been particularly anxious over the past few months. Symptoms are worse due to legal proceedings between her and her child's biological mother. Symptoms are also worse when her daughter has outbursts in public. Symptoms are mild in severity but interfering with quality of life.  Follow essential hypertension: She is requesting a refill on lisinopril-hydro-chlorothiazide. No outside blood pressures to report but chart review shows normotensive readings with pain management. She denies chest pain shortness of breath orthopnea nor peripheral edema.   Review Of Systems Outlined In HPI  Past Medical History  Diagnosis Date  . Osteoarthritis   . Hypertension   . Pain     chronic regional pain syndrome  . Fibromyalgia     Past Surgical History  Procedure Laterality Date  . Spinal cord stimulator    . Occipital nerve stimulator insertion    . Cholecystectomy  2000  . Knee surgery    . Shoulder surgery    . Tubal ligation  2000  . Endometrial ablation    . Spinal cord stimulator implant  2008  . Knee surgery Right 02-2015   Family History  Problem Relation Age of Onset  . Depression Mother     maternal grandmother  . Asthma Mother   . Fibromyalgia      aunt  . Stroke      grandmother  . Thyroid disease      grandmother    Social History   Social History  . Marital Status: Married    Spouse Name: N/A  . Number of Children: N/A  . Years of Education: N/A   Occupational History  . Not on file.   Social History Main Topics  . Smoking status: Current Some Day Smoker    Last Attempt to Quit: 02/18/2013  . Smokeless tobacco: Not on file  . Alcohol Use: No  . Drug Use: No  . Sexual Activity:    Partners: Male    Birth Control/ Protection: Surgical   Other  Topics Concern  . Not on file   Social History Narrative     Objective: BP 122/81 mmHg  Pulse 96  Wt 237 lb (107.502 kg)  General: Alert and Oriented, No Acute Distress HEENT: Pupils equal, round, reactive to light. Conjunctivae clear.   Lungs: Clear to auscultation bilaterally, no wheezing/ronchi/rales.  Comfortable work of breathing. Good air movement. Cardiac: Regular rate and rhythm. Normal S1/S2.  No murmurs, rubs, nor gallops.   Extremities: No peripheral edema.  Strong peripheral pulses.  Mental Status: No depression, anxiety, nor agitation. Skin: Warm and dry.  Assessment & Plan: Mary Cruz was seen today for medication management and medication refill.  Diagnoses and all orders for this visit:  Anxiety and depression -     DULoxetine (CYMBALTA) 60 MG capsule; Take 1 capsule (60 mg total) by mouth daily.  Essential hypertension, benign  Anxiety state -     clonazePAM (KLONOPIN) 0.5 MG tablet; Take 0.5-1 tablets (0.25-0.5 mg total) by mouth 2 (two) times daily as needed for anxiety.  Other orders -     lisinopril-hydrochlorothiazide (PRINZIDE,ZESTORETIC) 10-12.5 MG tablet; Take 1 tablet by mouth daily.   Anxiety and depression: Depression is controlled however anxiety has some room for improvement therefore continue as needed use of clonazepam and increasing duloxetine. Hypertension:  Controlled with lisinopril-hydrochlorothiazide  Return in about 3 months (around 08/05/2015) for Mood and BP.

## 2015-05-10 ENCOUNTER — Ambulatory Visit: Payer: BLUE CROSS/BLUE SHIELD | Admitting: Family Medicine

## 2015-05-19 ENCOUNTER — Other Ambulatory Visit: Payer: Self-pay | Admitting: Family Medicine

## 2015-05-31 ENCOUNTER — Ambulatory Visit: Payer: BLUE CROSS/BLUE SHIELD | Admitting: Family Medicine

## 2015-06-01 ENCOUNTER — Ambulatory Visit (INDEPENDENT_AMBULATORY_CARE_PROVIDER_SITE_OTHER): Payer: BLUE CROSS/BLUE SHIELD | Admitting: Family Medicine

## 2015-06-01 ENCOUNTER — Encounter: Payer: Self-pay | Admitting: Family Medicine

## 2015-06-01 VITALS — BP 122/82 | HR 103 | Wt 226.0 lb

## 2015-06-01 DIAGNOSIS — H8110 Benign paroxysmal vertigo, unspecified ear: Secondary | ICD-10-CM

## 2015-06-01 MED ORDER — MECLIZINE HCL 25 MG PO TABS: 25.0000 mg | ORAL_TABLET | Freq: Three times a day (TID) | ORAL | Status: DC | PRN

## 2015-06-01 NOTE — Progress Notes (Signed)
CC: Mary Cruz is a 40 y.o. female is here for Dizziness; Ear Fullness; Nausea; and Headache   Subjective: HPI:  For the past 2 weeks she's been experiencing daily dizziness. It's almost absent and only faintly present when at rest however moderate to severe when lying down or turning her head to the left or right. She denies any recent or remote trauma to her head. She's never had this before. She tells me is making it hard to focus on stationary objects. Nothing else seems to make it better or worse. No interventions as of yet. She denies hearing loss, ear pain, sore throat, nasal congestion, hearing loss or any other motor or sensory disturbances.   Review Of Systems Outlined In HPI  Past Medical History  Diagnosis Date  . Osteoarthritis   . Hypertension   . Pain     chronic regional pain syndrome  . Fibromyalgia     Past Surgical History  Procedure Laterality Date  . Spinal cord stimulator    . Occipital nerve stimulator insertion    . Cholecystectomy  2000  . Knee surgery    . Shoulder surgery    . Tubal ligation  2000  . Endometrial ablation    . Spinal cord stimulator implant  2008  . Knee surgery Right 02-2015   Family History  Problem Relation Age of Onset  . Depression Mother     maternal grandmother  . Asthma Mother   . Fibromyalgia      aunt  . Stroke      grandmother  . Thyroid disease      grandmother    Social History   Social History  . Marital Status: Married    Spouse Name: N/A  . Number of Children: N/A  . Years of Education: N/A   Occupational History  . Not on file.   Social History Main Topics  . Smoking status: Current Some Day Smoker    Last Attempt to Quit: 02/18/2013  . Smokeless tobacco: Not on file  . Alcohol Use: No  . Drug Use: No  . Sexual Activity:    Partners: Male    Birth Control/ Protection: Surgical   Other Topics Concern  . Not on file   Social History Narrative     Objective: BP 122/82 mmHg  Pulse 103   Wt 226 lb (102.513 kg)  General: Alert and Oriented, No Acute Distress HEENT: Pupils equal, round, reactive to light. Conjunctivae clear.  External ears unremarkable, canals clear with intact TMs with appropriate landmarks.  Middle ear appears open without effusion. Pink inferior turbinates.  Moist mucous membranes, pharynx without inflammation nor lesions.  Neck supple without palpable lymphadenopathy nor abnormal masses. Lungs: Clear to auscultation bilaterally, no wheezing/ronchi/rales.  Comfortable work of breathing. Good air movement. Cardiac: Regular rate and rhythm. Normal S1/S2.  No murmurs, rubs, nor gallops.   Neuro: Cranial nerves II through XII grossly intact Extremities: No peripheral edema.  Strong peripheral pulses.  Mental Status: No depression, anxiety, nor agitation. Skin: Warm and dry.  Assessment & Plan: Mary Cruz was seen today for dizziness, ear fullness, nausea and headache.  Diagnoses and all orders for this visit:  BPV (benign positional vertigo), unspecified laterality  Other orders -     meclizine (ANTIVERT) 25 MG tablet; Take 1 tablet (25 mg total) by mouth 3 (three) times daily as needed for dizziness.    she was shown how to do the modified Epley maneuver at home and encouraged her to  do this  3-4 times a day at home. If not getting better by next week please call and I can arrange physical therapy to help with this.Signs and symptoms requring emergent/urgent reevaluation were discussed with the patient.  Return if symptoms worsen or fail to improve.

## 2015-06-05 ENCOUNTER — Other Ambulatory Visit: Payer: Self-pay | Admitting: Family Medicine

## 2015-06-29 ENCOUNTER — Other Ambulatory Visit: Payer: Self-pay | Admitting: Family Medicine

## 2015-06-29 ENCOUNTER — Ambulatory Visit: Payer: BLUE CROSS/BLUE SHIELD | Attending: Pain Medicine | Admitting: Pain Medicine

## 2015-06-29 ENCOUNTER — Encounter: Payer: Self-pay | Admitting: Pain Medicine

## 2015-06-29 ENCOUNTER — Telehealth: Payer: Self-pay | Admitting: *Deleted

## 2015-06-29 VITALS — BP 131/83 | HR 122 | Temp 98.4°F | Resp 16 | Ht 64.0 in | Wt 238.0 lb

## 2015-06-29 DIAGNOSIS — M7918 Myalgia, other site: Secondary | ICD-10-CM

## 2015-06-29 DIAGNOSIS — R252 Cramp and spasm: Secondary | ICD-10-CM | POA: Diagnosis not present

## 2015-06-29 DIAGNOSIS — J45901 Unspecified asthma with (acute) exacerbation: Secondary | ICD-10-CM | POA: Insufficient documentation

## 2015-06-29 DIAGNOSIS — K219 Gastro-esophageal reflux disease without esophagitis: Secondary | ICD-10-CM | POA: Insufficient documentation

## 2015-06-29 DIAGNOSIS — K589 Irritable bowel syndrome without diarrhea: Secondary | ICD-10-CM | POA: Insufficient documentation

## 2015-06-29 DIAGNOSIS — Z79891 Long term (current) use of opiate analgesic: Secondary | ICD-10-CM

## 2015-06-29 DIAGNOSIS — Z9689 Presence of other specified functional implants: Secondary | ICD-10-CM | POA: Diagnosis not present

## 2015-06-29 DIAGNOSIS — N200 Calculus of kidney: Secondary | ICD-10-CM | POA: Diagnosis not present

## 2015-06-29 DIAGNOSIS — E559 Vitamin D deficiency, unspecified: Secondary | ICD-10-CM | POA: Diagnosis not present

## 2015-06-29 DIAGNOSIS — M25561 Pain in right knee: Secondary | ICD-10-CM | POA: Diagnosis not present

## 2015-06-29 DIAGNOSIS — F419 Anxiety disorder, unspecified: Secondary | ICD-10-CM | POA: Insufficient documentation

## 2015-06-29 DIAGNOSIS — G8929 Other chronic pain: Secondary | ICD-10-CM | POA: Diagnosis not present

## 2015-06-29 DIAGNOSIS — I82611 Acute embolism and thrombosis of superficial veins of right upper extremity: Secondary | ICD-10-CM | POA: Diagnosis not present

## 2015-06-29 DIAGNOSIS — L02411 Cutaneous abscess of right axilla: Secondary | ICD-10-CM | POA: Insufficient documentation

## 2015-06-29 DIAGNOSIS — M542 Cervicalgia: Secondary | ICD-10-CM | POA: Diagnosis present

## 2015-06-29 DIAGNOSIS — Z5181 Encounter for therapeutic drug level monitoring: Secondary | ICD-10-CM

## 2015-06-29 DIAGNOSIS — M549 Dorsalgia, unspecified: Secondary | ICD-10-CM | POA: Diagnosis present

## 2015-06-29 DIAGNOSIS — M47816 Spondylosis without myelopathy or radiculopathy, lumbar region: Secondary | ICD-10-CM

## 2015-06-29 DIAGNOSIS — I10 Essential (primary) hypertension: Secondary | ICD-10-CM | POA: Insufficient documentation

## 2015-06-29 DIAGNOSIS — F17200 Nicotine dependence, unspecified, uncomplicated: Secondary | ICD-10-CM | POA: Diagnosis not present

## 2015-06-29 DIAGNOSIS — M545 Low back pain, unspecified: Secondary | ICD-10-CM

## 2015-06-29 DIAGNOSIS — M791 Myalgia: Secondary | ICD-10-CM

## 2015-06-29 HISTORY — DX: Spondylosis without myelopathy or radiculopathy, lumbar region: M47.816

## 2015-06-29 MED ORDER — OXYCODONE HCL 5 MG PO TABS: 5.0000 mg | ORAL_TABLET | Freq: Four times a day (QID) | ORAL | Status: DC | PRN

## 2015-06-29 MED ORDER — CYCLOBENZAPRINE HCL 10 MG PO TABS: 10.0000 mg | ORAL_TABLET | Freq: Three times a day (TID) | ORAL | Status: DC | PRN

## 2015-06-29 MED ORDER — METAXALONE 800 MG PO TABS: 800.0000 mg | ORAL_TABLET | Freq: Three times a day (TID) | ORAL | Status: DC

## 2015-06-29 NOTE — Progress Notes (Signed)
Safety precautions to be maintained throughout the outpatient stay will include: orient to surroundings, keep bed in low position, maintain call bell within reach at all times, provide assistance with transfer out of bed and ambulation. Oxycodone # [id number]  Filled 06-05-15

## 2015-06-29 NOTE — Patient Instructions (Addendum)
Smoking Cessation, Tips for Success If you are ready to quit smoking, congratulations! You have chosen to help yourself be healthier. Cigarettes bring nicotine, tar, carbon monoxide, and other irritants into your body. Your lungs, heart, and blood vessels will be able to work better without these poisons. There are many different ways to quit smoking. Nicotine gum, nicotine patches, a nicotine inhaler, or nicotine nasal spray can help with physical craving. Hypnosis, support groups, and medicines help break the habit of smoking. WHAT THINGS CAN I DO TO MAKE QUITTING EASIER?  Here are some tips to help you quit for good:  Pick a date when you will quit smoking completely. Tell all of your friends and family about your plan to quit on that date.  Do not try to slowly cut down on the number of cigarettes you are smoking. Pick a quit date and quit smoking completely starting on that day.  Throw away all cigarettes.   Clean and remove all ashtrays from your home, work, and car.  On a card, write down your reasons for quitting. Carry the card with you and read it when you get the urge to smoke.  Cleanse your body of nicotine. Drink enough water and fluids to keep your urine clear or pale yellow. Do this after quitting to flush the nicotine from your body.  Learn to predict your moods. Do not let a bad situation be your excuse to have a cigarette. Some situations in your life might tempt you into wanting a cigarette.  Never have "just one" cigarette. It leads to wanting another and another. Remind yourself of your decision to quit.  Change habits associated with smoking. If you smoked while driving or when feeling stressed, try other activities to replace smoking. Stand up when drinking your coffee. Brush your teeth after eating. Sit in a different chair when you read the paper. Avoid alcohol while trying to quit, and try to drink fewer caffeinated beverages. Alcohol and caffeine may urge you to  smoke.  Avoid foods and drinks that can trigger a desire to smoke, such as sugary or spicy foods and alcohol.  Ask people who smoke not to smoke around you.  Have something planned to do right after eating or having a cup of coffee. For example, plan to take a walk or exercise.  Try a relaxation exercise to calm you down and decrease your stress. Remember, you may be tense and nervous for the first 2 weeks after you quit, but this will pass.  Find new activities to keep your hands busy. Play with a pen, coin, or rubber band. Doodle or draw things on paper.  Brush your teeth right after eating. This will help cut down on the craving for the taste of tobacco after meals. You can also try mouthwash.   Use oral substitutes in place of cigarettes. Try using lemon drops, carrots, cinnamon sticks, or chewing gum. Keep them handy so they are available when you have the urge to smoke.  When you have the urge to smoke, try deep breathing.  Designate your home as a nonsmoking area.  If you are a heavy smoker, ask your health care provider about a prescription for nicotine chewing gum. It can ease your withdrawal from nicotine.  Reward yourself. Set aside the cigarette money you save and buy yourself something nice.  Look for support from others. Join a support group or smoking cessation program. Ask someone at home or at work to help you with your plan   to quit smoking.  Always ask yourself, "Do I need this cigarette or is this just a reflex?" Tell yourself, "Today, I choose not to smoke," or "I do not want to smoke." You are reminding yourself of your decision to quit.  Do not replace cigarette smoking with electronic cigarettes (commonly called e-cigarettes). The safety of e-cigarettes is unknown, and some may contain harmful chemicals.  If you relapse, do not give up! Plan ahead and think about what you will do the next time you get the urge to smoke. HOW WILL I FEEL WHEN I QUIT SMOKING? You  may have symptoms of withdrawal because your body is used to nicotine (the addictive substance in cigarettes). You may crave cigarettes, be irritable, feel very hungry, cough often, get headaches, or have difficulty concentrating. The withdrawal symptoms are only temporary. They are strongest when you first quit but will go away within 10-14 days. When withdrawal symptoms occur, stay in control. Think about your reasons for quitting. Remind yourself that these are signs that your body is healing and getting used to being without cigarettes. Remember that withdrawal symptoms are easier to treat than the major diseases that smoking can cause.  Even after the withdrawal is over, expect periodic urges to smoke. However, these cravings are generally short lived and will go away whether you smoke or not. Do not smoke! WHAT RESOURCES ARE AVAILABLE TO HELP ME QUIT SMOKING? Your health care provider can direct you to community resources or hospitals for support, which may include:  Group support.  Education.  Hypnosis.  Therapy.   This information is not intended to replace advice given to you by your health care provider. Make sure you discuss any questions you have with your health care provider.   Document Released: 10/27/2003 Document Revised: 02/18/2014 Document Reviewed: 07/16/2012 Elsevier Interactive Patient Education 2016 [place] tablets What is this medicine? METAXALONE (me TAX a lone) is a muscle relaxer. It is used to treat pain and stiffness in muscles caused by strains, sprains, or other injury. This medicine may be used for other purposes; ask your health care provider or pharmacist if you have questions. What should I tell my health care provider before I take this medicine? They need to know if you have any of these conditions: -anemia or blood disorder -kidney disease -liver disease -an unusual or allergic reaction to metaxalone, other medicines, foods, dyes, or  preservatives -pregnant or trying to get pregnant -breast-feeding How should I use this medicine? Take this medicine by mouth. Swallow it with a full glass of water. Follow the directions on the prescription label. Do not take more medicine than you are told to take. Talk to your pediatrician regarding the use of this medicine in children. While this drug may be prescribed for children as young as 46 years of age for selected conditions, precautions do apply. Overdosage: If you think you have taken too much of this medicine contact a poison control center or emergency room at once. NOTE: This medicine is only for you. Do not share this medicine with others. What if I miss a dose? If you miss a dose, take it as soon as you can. If it is almost time for your next dose, take only that dose. Do not take double or extra doses. What may interact with this medicine? -alcohol or medicines that contain alcohol -antihistamines -medicines for depression, anxiety, and other mental conditions -medicines for pain like codeine, oxycodone, tramadol, and propoxyphene -medicines for sleep -phenobarbital  This list may not describe all possible interactions. Give your health care provider a list of all the medicines, herbs, non-prescription drugs, or dietary supplements you use. Also tell them if you smoke, drink alcohol, or use illegal drugs. Some items may interact with your medicine. What should I watch for while using this medicine? Check with your doctor or health care professional if your condition does not improve within 1 to 3 weeks. You may get drowsy or dizzy when you first start taking the medicine or change doses. Do not drive, use machinery, or do anything that may be dangerous until you know how the medicine affects you. Stand or sit up slowly. What side effects may I notice from receiving this medicine? Side effects that you should report to your doctor or health care professional as soon as  possible: -difficulty breathing -skin rash -unusually weak or tired -vomiting -swelling of face, lips, or tongue -yellow eyes or skin Side effects that usually do not require medical attention (report to your doctor or health care professional if they continue or are bothersome): -headache -irritability -nervousness -stomach upset This list may not describe all possible side effects. Call your doctor for medical advice about side effects. You may report side effects to FDA at 1-800-FDA-1088. Where should I keep my medicine? Keep out of the reach of children. Store at room temperature between 15 and 30 degrees C (59 and 86 degrees F). Keep container tightly closed. Throw away any unused medicine after the expiration date. NOTE: This sheet is a summary. It may not cover all possible information. If you have questions about this medicine, talk to your doctor, pharmacist, or health care provider.    2016, Elsevier/[employer] Standard. (2013-08-12 15:32:46) Radiofrequency Lesioning Radiofrequency lesioning is a procedure that is performed to relieve pain. The procedure is often used for back, neck, or arm pain. Radiofrequency lesioning involves the use of a machine that creates radio waves to make heat. During the procedure, the heat is applied to the nerve that carries the pain signal. The heat damages the nerve and interferes with the pain signal. Pain relief usually lasts for 6 months to 1 year. LET [facility] CARE PROVIDER KNOW ABOUT:  Any allergies you have.  All medicines you are taking, including vitamins, herbs, eye drops, creams, and over-the-counter medicines.  Previous problems you or members of your family have had with the use of anesthetics.  Any blood disorders you have.  Previous surgeries you have had.  Any medical conditions you have.  Whether you are pregnant or may be pregnant. RISKS AND COMPLICATIONS Generally, this is a safe procedure. However, problems may occur,  including:  Pain or soreness at the injection site.  Infection at the injection site.  Damage to nerves or blood vessels. BEFORE THE PROCEDURE  Ask your health care provider about:  Changing or stopping your regular medicines. This is especially important if you are taking diabetes medicines or blood thinners.  Taking medicines such as aspirin and ibuprofen. These medicines can thin your blood. Do not take these medicines before your procedure if your health care provider instructs you not to.  Follow instructions from your health care provider about eating or drinking restrictions.  Plan to have someone take you home after the procedure.  If you go home right after the procedure, plan to have someone with you for 24 hours. PROCEDURE  You will be given one or more of the following:  A medicine to help you relax (sedative).  A medicine to numb the area (local anesthetic).  You will be awake during the procedure. You will need to be able to talk with the health care provider during the procedure.  With the help of a type of X-ray (fluoroscopy), the health care provider will insert a radiofrequency needle into the area to be treated.  Next, a wire that carries the radio waves (electrode) will be put through the radiofrequency needle. An electrical pulse will be sent through the electrode to verify the correct nerve. You will feel a tingling sensation, and you may have muscle twitching.  Then, the tissue that is around the needle tip will be heated by an electric current that is passed using the radiofrequency machine. This will numb the nerves.  A bandage (dressing) will be put on the insertion area after the procedure is done. The procedure may vary among health care providers and hospitals. AFTER THE PROCEDURE  Your blood pressure, heart rate, breathing rate, and blood oxygen level will be monitored often until the medicines you were given have worn off.  Return to your  normal activities as directed by your health care provider.   This information is not intended to replace advice given to you by your health care provider. Make sure you discuss any questions you have with your health care provider.   Document Released: 09/26/2010 Document Revised: 10/19/2014 Document Reviewed: 03/07/2014 Elsevier Interactive Patient Education [employer].

## 2015-06-29 NOTE — Progress Notes (Signed)
Patient's Name: Mary Cruz  Patient type: Established  MRN: [id number]  Service setting: Ambulatory outpatient  DOB: October 11, 1975  Location: [facility]  DOS: 06/29/2015  Primary Care Physician: Marcial Pacas, DO  Note by: Kathlen Brunswick. Dossie Arbour, M.D, DABA, DABAPM, DABPM, Milagros Evener, FIPP  Referring Physician: Marcial Pacas, DO  Specialty: Board-Certified Interventional Pain Management  Last Visit to Pain Management: 04/14/2015   Primary Reason(s) for Visit: Encounter for prescription drug management & post-procedure evaluation of chronic illness with mild to moderate exacerbation(Level of risk: moderate) CC: Back Pain and Neck Pain   HPI  Mary Cruz is a 40 y.o. year old, female patient, who returns today as an established patient. She has GERD; IRRITABLE BOWEL SYNDROME; KIDNEY STONES; Essential hypertension, benign; Chronic pain syndrome; Sepsis affecting skin; Abscess of axillary fold; Generalized anxiety disorder; Obesity; Chronic pain; Long term current use of opiate analgesic; Long term prescription opiate use; Opiate use; Opiate dependence ([place]); Encounter for therapeutic drug level monitoring; Musculoskeletal pain; Muscle cramping; Osteoarthrosis; Vitamin D insufficiency; Presence of functional implant ([employer] Lumbar spinal cord stimulator implant); Encounter for management of implanted device; Chronic low back pain (Bilateral) (R>L); Chronic lumbar radicular pain (Right L5 dermatome; Left S1 Dermatome) (Bilateral) (R>L); Chronic hip pain (Location of Tertiary source of pain) (Bilateral) (R>L); Chronic neck pain (Location of Primary Source of Pain) (Bilateral) (L>R); Chronic upper extremity pain (Location of Secondary source of pain) (Bilateral) (L>R); Morbid obesity ([place]); Nicotine dependence; Chronic cervical radicular pain (C7 Dermatome) (Bilateral) (L>R); Chronic lower extremity pain (Bilateral) (R>L); Right knee pain; Anxiety and depression; Acute bronchitis with  asthma with acute exacerbation; Implant pain at battery site (right buttocks area); Lumbar spondylosis; and Lumbar facet syndrome (Bilateral) (R>L) on her problem list.. Her primarily concern today is the Back Pain and Neck Pain   Pain Assessment: Self-Reported Pain Score: 6  Reported level is compatible with observation Pain Type: Chronic pain Pain Location: Neck Pain Orientation: Lower Pain Descriptors / Indicators: Spasm (twitching) Pain Frequency: Constant  The patient comes into the clinics today for post-procedure evaluation on the interventional treatment done on 04/13/2015. In addition, she comes in today for pharmacological management of her chronic pain.  The patient  reports that she does not use illicit drugs.   The patient indicates that the entire tizanidine causes her to have dizziness and therefore she has stopped using it. Today we will replace it with Skelaxin. We will go ahead and schedule her for a right-sided lumbar facet radiofrequency neurotomy under fluoroscopic guidance. The patient did great with her diagnostic lumbar facet block.  Date of Last Visit: 04/13/15 Service Provided on Last Visit: Procedure  Controlled Substance Pharmacotherapy Assessment & REMS (Risk Evaluation and Mitigation Strategy)  Analgesic: Oxycodone IR 5 mg every 6 hours when necessary for pain. (20 mg/day) Pill Count: Oxycodone # 58/120 Filled 06-05-15. MME/day: 30 mg/day.  Pharmacokinetics: Onset of action (Liberation/Absorption): Within expected pharmacological parameters Time to Peak effect (Distribution): Timing and results are as within normal expected parameters Duration of action (Metabolism/Excretion): Within normal limits for medication Pharmacodynamics: Analgesic Effect: More than 50% Activity Facilitation: Medication(s) allow patient to sit, stand, walk, and do the basic ADLs Perceived Effectiveness: Described as relatively effective, allowing for increase in activities of daily  living (ADL) Side-effects or Adverse reactions: None reported Monitoring: [place] PMP: Online review of the past 42-month period conducted. Compliant with practice rules and regulations UDS Results/interpretation: The patient's last UDS was done on 04/05/2015 and it came back with unexpected results.  Apparently the patient had indicated taking some hydrocodone but that was not in the sample. In addition, the patient also indicated taking some clonazepam that was also not in the sample. Other than that the oxycodone that we prescribed and its metabolites were present as expected and therefore the results were compliant. Medication Assessment Form: Reviewed. Patient indicates being compliant with therapy Treatment compliance: Compliant Risk Assessment: Aberrant Behavior: None observed today Substance Use Disorder (SUD) Risk Level: Low Risk of opioid abuse or dependence: 0.7-3.0% with doses ? 36 MME/day and 6.1-26% with doses ? 120 MME/day. Opioid Risk Tool (ORT) Score: Total Score: 0 Low Risk for SUD (Score <3) Depression Scale Score: PHQ-2: PHQ-2 Total Score: 0 No depression (0) PHQ-9: PHQ-9 Total Score: 0 No depression (0-4)  Pharmacologic Plan: No change in therapy, at this time  Post-Procedure Assessment  Procedure done on last visit: Left-sided C7-T1 cervical epidural steroid injection under fluoroscopic guidance and IV sedation. Side-effects or Adverse reactions: None reported Sedation: Sedation given  Results: Ultra-Short Term Relief (First 1 hour after procedure): 100 %  Analgesia during this period is likely to be Local Anesthetic and/or IV Sedative (Analgesic/Anxiolitic) related Short Term Relief (Initial 4-6 hrs after procedure): 100 % Complete relief confirms area to be the source of pain Long Term Relief : 90 % Long-term benefit would suggest an inflammatory etiology to the pain   Current Relief (Now): 90%  Persistent relief would suggest effective anti-inflammatory effects from  steroids Interpretation of Results: Clearly this type of therapy is very effective as a palliative treatment for her neck and upper extremity pain.  Laboratory Chemistry  Inflammation Markers Lab Results  Component Value Date   ESRSEDRATE 35* 04/05/2015   CRP 1.0* 04/05/2015    Renal Function Lab Results  Component Value Date   BUN 12 04/05/2015   CREATININE 0.97 04/05/2015   GFRAA >60 04/05/2015   GFRNONAA >60 04/05/2015    Hepatic Function Lab Results  Component Value Date   AST 20 04/05/2015   ALT 21 04/05/2015   ALBUMIN 4.1 04/05/2015    Electrolytes Lab Results  Component Value Date   NA 141 04/05/2015   K 3.1* 04/05/2015   CL 103 04/05/2015   CALCIUM 9.2 04/05/2015   MG 2.0 04/05/2015    Pain Modulating Vitamins Lab Results  Component Value Date   VD25OH 38 01/20/2015    Coagulation Parameters No results found for: INR, LABPROT  Note: I personally reviewed the above data. Results made available to patient.  Recent Diagnostic Imaging  US Venous Img Upper Uni Right  11/11/2014  CLINICAL DATA:  Right axillary fullness, and DVT suggested on previous limited exam. EXAM: RIGHT UPPER EXTREMITY VENOUS DOPPLER ULTRASOUND TECHNIQUE: Gray-scale sonography with graded compression, as well as color Doppler and duplex ultrasound were performed to evaluate the upper extremity deep venous system from the level of the subclavian vein and including the jugular, axillary, basilic and upper cephalic vein. Spectral Doppler was utilized to evaluate flow at rest and with distal augmentation maneuvers. COMPARISON:  ultrasound of 11/10/2014 by report only FINDINGS: Thrombus within deep veins:  None visualized. Compressibility of deep veins:  Normal. Duplex waveform respiratory phasicity:  Normal. Duplex waveform response to augmentation:  Normal. Venous reflux:  None visualized. Other findings: Directed scanning in the area of axillary fullness shows no thrombus, mass, adenopathy, or  other lesion. Contralateral vein imaging was not obtained. IMPRESSION: 1. No evidence of right upper extremity DVT. Electronically Signed   By: Lucrezia Europe  M.D.   On: 11/11/2014 10:49   [place] Extrem Up Right Ltd  11/10/2014  CLINICAL DATA:  40 year old female presenting for evaluation of a palpable abnormality in the right axilla for about 3 weeks. She also describes an odd sensation in her right upper extremity and some swelling in the right breast for approximately 2 weeks. The patient has had prior history of a right axillary abscess. EXAM: DIGITAL DIAGNOSTIC BILATERAL MAMMOGRAM WITH 3D TOMOSYNTHESIS AND CAD RIGHT AXILLARY ULTRASOUND COMPARISON:  BASELINE MAMMOGRAM.  NO PRIORS. ACR Breast Density Category b: There are scattered areas of fibroglandular density. FINDINGS: A palpable marker has been placed over the right axilla. A normal-appearing lymph node is seen deep to the palpable marker on the tangential view. No suspicious masses, calcifications or areas of distortion are identified in the bilateral breasts. Mammographic images were processed with CAD. Physical exam of the right axilla demonstrates an area of fullness without a discrete palpable mass. Ultrasound targeted to the right axilla demonstrates an elongated tubular hypoechoic structure running adjacent to an artery. No blood flow was identified within this structure on color Doppler imaging, and this is suspected to represent a thrombosed vein. No suspicious masses or areas of shadowing are seen in the superficial tissues. IMPRESSION: 1. A thrombosed vein is identified in the area of palpable concern in the right axilla. 2.  No mammographic evidence of malignancy in the bilateral breasts. RECOMMENDATION: 1. A dedicated right upper extremity ultrasound is recommended to evaluate for DVT. An e-mail has been sent to Dr. Ileene Rubens at 12:20 p.m. on 11/10/2014. 2.  Screening mammogram in one year.(Code:SM-B-01Y) I have discussed the findings and  recommendations with the patient. Results were also provided in writing at the conclusion of the visit. If applicable, a reminder letter will be sent to the patient regarding the next appointment. BI-RADS CATEGORY  2: Benign. Electronically Signed   By: Ammie Ferrier M.D.   On: 11/10/2014 13:25   Mm Diag Breast Tomo Bilateral  11/10/2014  CLINICAL DATA:  40 year old female presenting for evaluation of a palpable abnormality in the right axilla for about 3 weeks. She also describes an odd sensation in her right upper extremity and some swelling in the right breast for approximately 2 weeks. The patient has had prior history of a right axillary abscess. EXAM: DIGITAL DIAGNOSTIC BILATERAL MAMMOGRAM WITH 3D TOMOSYNTHESIS AND CAD RIGHT AXILLARY ULTRASOUND COMPARISON:  BASELINE MAMMOGRAM.  NO PRIORS. ACR Breast Density Category b: There are scattered areas of fibroglandular density. FINDINGS: A palpable marker has been placed over the right axilla. A normal-appearing lymph node is seen deep to the palpable marker on the tangential view. No suspicious masses, calcifications or areas of distortion are identified in the bilateral breasts. Mammographic images were processed with CAD. Physical exam of the right axilla demonstrates an area of fullness without a discrete palpable mass. Ultrasound targeted to the right axilla demonstrates an elongated tubular hypoechoic structure running adjacent to an artery. No blood flow was identified within this structure on color Doppler imaging, and this is suspected to represent a thrombosed vein. No suspicious masses or areas of shadowing are seen in the superficial tissues. IMPRESSION: 1. A thrombosed vein is identified in the area of palpable concern in the right axilla. 2.  No mammographic evidence of malignancy in the bilateral breasts. RECOMMENDATION: 1. A dedicated right upper extremity ultrasound is recommended to evaluate for DVT. An e-mail has been sent to Dr. Ileene Rubens at  12:20 p.m. on 11/10/2014. 2.  Screening  mammogram in one year.(Code:SM-B-01Y) I have discussed the findings and recommendations with the patient. Results were also provided in writing at the conclusion of the visit. If applicable, a reminder letter will be sent to the patient regarding the next appointment. BI-RADS CATEGORY  2: Benign. Electronically Signed   By: Ammie Ferrier M.D.   On: 11/10/2014 13:25    Meds  The patient has a current medication list which includes the following prescription(s): amitriptyline, vitamin d3, clonazepam, duloxetine, furosemide, lisinopril-hydrochlorothiazide, meclizine, metformin, omeprazole, oxycodone, oxycodone, oxycodone, and metaxalone.  Current Outpatient Prescriptions on File Prior to Visit  Medication Sig  . amitriptyline (ELAVIL) 100 MG tablet Take 1 tablet (100 mg total) by mouth at bedtime.  . Cholecalciferol (VITAMIN D3) 2000 units TABS Take 1 tablet by mouth daily.  . clonazePAM (KLONOPIN) 0.5 MG tablet Take 0.5-1 tablets (0.25-0.5 mg total) by mouth 2 (two) times daily as needed for anxiety.  . DULoxetine (CYMBALTA) 60 MG capsule Take 1 capsule (60 mg total) by mouth daily.  . furosemide (LASIX) 20 MG tablet Take 1-2 tablets (20-40 mg total) by mouth daily as needed for fluid.  [name] lisinopril-hydrochlorothiazide (PRINZIDE,ZESTORETIC) 10-12.5 MG tablet Take 1 tablet by mouth daily.  . meclizine (ANTIVERT) 25 MG tablet Take 1 tablet (25 mg total) by mouth 3 (three) times daily as needed for dizziness.  . metFORMIN (GLUCOPHAGE) 500 MG tablet Take by mouth 2 (two) times daily with a meal.  . omeprazole (PRILOSEC) 40 MG capsule TAKE ONE CAPSULE TWICE A DAY.   No current facility-administered medications on file prior to visit.    ROS  Constitutional: Denies any fever or chills Gastrointestinal: No reported hemesis, hematochezia, vomiting, or acute GI distress Musculoskeletal: Denies any acute onset joint swelling, redness, loss of ROM, or  weakness Neurological: No reported episodes of acute onset apraxia, aphasia, dysarthria, agnosia, amnesia, paralysis, loss of coordination, or loss of consciousness  Allergies  Ms. Ruhland is allergic to amoxicillin; bactrim; celebrex; doxycycline; erythromycin; hydromorphone hcl; lyrica; nabumetone; and pentazocine lactate.  [place]  Medical:  Ms. Szarka  has a past medical history of Osteoarthritis; Hypertension; Pain; and Fibromyalgia. Family: family history includes Asthma in her mother; Depression in her mother. Surgical:  has past surgical history that includes spinal cord stimulator; Occipital nerve stimulator insertion; Cholecystectomy (2000); Knee surgery; Shoulder surgery; Tubal ligation (2000); Endometrial ablation; Spinal cord stimulator implant (2008); and Knee surgery (Right, 02-2015). Tobacco:  reports that she has been smoking.  She does not have any smokeless tobacco history on file. Alcohol:  reports that she does not drink alcohol. Drug:  reports that she does not use illicit drugs.  Constitutional Exam  Vitals: Blood pressure 131/83, pulse 122, temperature 98.4 F (36.9 C), resp. rate 16, height 5\' 4"  (1.626 m), weight 238 lb (107.956 kg), SpO2 100 %. General appearance: Well nourished, well developed, and well hydrated. In no acute distress Calculated BMI/Body habitus: Body mass index is 40.83 kg/(m^2). (>40 kg/m2) Extreme obesity (Class III) - 254% higher incidence of chronic pain Psych/Mental status: Alert and oriented x 3 (person, place, & time) Eyes: PERLA Respiratory: No evidence of acute respiratory distress  Cervical Spine Exam  Inspection: No masses, redness, or swelling Alignment: Symmetrical ROM: Functional: ROM is within functional limits [facility]) Stability: No instability detected Muscle strength & Tone: Functionally intact Sensory: Unimpaired Palpation: No complaints of tenderness  Upper Extremity (UE) Exam    Side: Right upper extremity  Side: Left  upper extremity  Inspection: No masses, redness, swelling, or  asymmetry  Inspection: No masses, redness, swelling, or asymmetry  ROM:  ROM:  Functional: ROM is within functional limits [facility])  Functional: ROM is within functional limits [facility])  Muscle strength & Tone: Functionally intact  Muscle strength & Tone: Functionally intact  Sensory: Unimpaired  Sensory: Unimpaired  Palpation: Non-contributory  Palpation: Non-contributory   Thoracic Spine Exam  Inspection: No masses, redness, or swelling Alignment: Symmetrical ROM: Functional: ROM is within functional limits [facility]) Stability: No instability detected Sensory: Unimpaired Muscle strength & Tone: Functionally intact Palpation: No complaints of tenderness  Lumbar Spine Exam  Inspection: No masses, redness, or swelling Alignment: Symmetrical ROM: Functional: Reduced REM Stability: No instability detected Muscle strength & Tone: Functionally intact Sensory: Unimpaired Palpation: Tender Provocative Tests: Lumbar Hyperextension and rotation test: Positive positive bilaterally for lumbar facet pain but with the right side being worse than the left. Patrick's Maneuver: deferred  Gait & Posture Assessment  Ambulation: Unassisted Gait: Unaffected Posture: WNL  Lower Extremity Exam    Side: Right lower extremity  Side: Left lower extremity  Inspection: No masses, redness, swelling, or asymmetry ROM:  Inspection: No masses, redness, swelling, or asymmetry ROM:  Functional: ROM is within functional limits [facility])  Functional: ROM is within functional limits [facility])  Muscle strength & Tone: Functionally intact  Muscle strength & Tone: Functionally intact  Sensory: Unimpaired  Sensory: Unimpaired  Palpation: Non-contributory  Palpation: Non-contributory   Assessment & Plan  Primary Diagnosis & Pertinent Problem List: The primary encounter diagnosis was Chronic pain. Diagnoses of Encounter for therapeutic drug level monitoring, Long  term current use of opiate analgesic, Chronic neck pain (Location of Primary Source of Pain) (Bilateral) (L>R), Muscle cramping, Musculoskeletal pain, Lumbar spondylosis, unspecified spinal osteoarthritis, Lumbar facet syndrome (Bilateral) (R>L), and Chronic low back pain (Bilateral) (R>L) were also pertinent to this visit.  Visit Diagnosis: 1. Chronic pain   2. Encounter for therapeutic drug level monitoring   3. Long term current use of opiate analgesic   4. Chronic neck pain (Location of Primary Source of Pain) (Bilateral) (L>R)   5. Muscle cramping   6. Musculoskeletal pain   7. Lumbar spondylosis, unspecified spinal osteoarthritis   8. Lumbar facet syndrome (Bilateral) (R>L)   9. Chronic low back pain (Bilateral) (R>L)     Problems updated and reviewed during this visit: Problem  Lumbar Spondylosis  Lumbar facet syndrome (Bilateral) (R>L)  Chronic low back pain (Bilateral) (R>L)    Problem-specific Plan(s): No problem-specific assessment & plan notes found for this encounter.  No new assessment & plan notes have been filed under this hospital service since the last note was generated. Service: Pain Management   Plan of Care   Problem List Items Addressed This Visit      High   Chronic low back pain (Bilateral) (R>L) (Chronic)   Relevant Medications   oxyCODONE (OXY IR/ROXICODONE) 5 MG immediate release tablet   oxyCODONE (OXY IR/ROXICODONE) 5 MG immediate release tablet   oxyCODONE (OXY IR/ROXICODONE) 5 MG immediate release tablet   metaxalone (SKELAXIN) 800 MG tablet   Chronic neck pain (Location of Primary Source of Pain) (Bilateral) (L>R) (Chronic)   Relevant Medications   oxyCODONE (OXY IR/ROXICODONE) 5 MG immediate release tablet   oxyCODONE (OXY IR/ROXICODONE) 5 MG immediate release tablet   oxyCODONE (OXY IR/ROXICODONE) 5 MG immediate release tablet   metaxalone (SKELAXIN) 800 MG tablet   Chronic pain - Primary (Chronic)   Relevant Medications   oxyCODONE  (OXY IR/ROXICODONE) 5 MG immediate release tablet  oxyCODONE (OXY IR/ROXICODONE) 5 MG immediate release tablet   oxyCODONE (OXY IR/ROXICODONE) 5 MG immediate release tablet   metaxalone (SKELAXIN) 800 MG tablet   Lumbar facet syndrome (Bilateral) (R>L) (Chronic)   Relevant Medications   oxyCODONE (OXY IR/ROXICODONE) 5 MG immediate release tablet   oxyCODONE (OXY IR/ROXICODONE) 5 MG immediate release tablet   oxyCODONE (OXY IR/ROXICODONE) 5 MG immediate release tablet   metaxalone (SKELAXIN) 800 MG tablet   Other Relevant Orders   Radiofrequency,Lumbar   Lumbar spondylosis (Chronic)   Relevant Medications   oxyCODONE (OXY IR/ROXICODONE) 5 MG immediate release tablet   oxyCODONE (OXY IR/ROXICODONE) 5 MG immediate release tablet   oxyCODONE (OXY IR/ROXICODONE) 5 MG immediate release tablet   metaxalone (SKELAXIN) 800 MG tablet   Muscle cramping (Chronic)   Relevant Medications   metaxalone (SKELAXIN) 800 MG tablet   Musculoskeletal pain (Chronic)   Relevant Medications   metaxalone (SKELAXIN) 800 MG tablet     Medium   Encounter for therapeutic drug level monitoring   Long term current use of opiate analgesic (Chronic)   Relevant Orders   ToxASSURE Select 13 (MW), Urine       Pharmacotherapy (Medications Ordered): Meds ordered this encounter  Medications  . oxyCODONE (OXY IR/ROXICODONE) 5 MG immediate release tablet    Sig: Take 1 tablet (5 mg total) by mouth every 6 (six) hours as needed for severe pain.    Dispense:  120 tablet    Refill:  0    Do not add this medication to the electronic "Automatic Refill" notification system. Patient may have prescription filled one day early if pharmacy is closed on scheduled refill date. Do not fill until: 07/05/15 To last until: 08/04/15  . oxyCODONE (OXY IR/ROXICODONE) 5 MG immediate release tablet    Sig: Take 1 tablet (5 mg total) by mouth every 6 (six) hours as needed for severe pain.    Dispense:  120 tablet    Refill:  0     Do not add this medication to the electronic "Automatic Refill" notification system. Patient may have prescription filled one day early if pharmacy is closed on scheduled refill date. Do not fill until: 08/04/15 To last until: 09/03/15  . oxyCODONE (OXY IR/ROXICODONE) 5 MG immediate release tablet    Sig: Take 1 tablet (5 mg total) by mouth every 6 (six) hours as needed for severe pain.    Dispense:  120 tablet    Refill:  0    Do not add this medication to the electronic "Automatic Refill" notification system. Patient may have prescription filled one day early if pharmacy is closed on scheduled refill date. Do not fill until: 09/03/15 To last until: 10/03/15  . metaxalone (SKELAXIN) 800 MG tablet    Sig: Take 1 tablet (800 mg total) by mouth 3 (three) times daily.    Dispense:  90 tablet    Refill:  2    Do not place this medication, or any other prescription from our practice, on "Automatic Refill". Patient may have prescription filled one day early if pharmacy is closed on scheduled refill date.    Lab-work & Procedure Ordered: Orders Placed This Encounter  Procedures  . Radiofrequency,Lumbar  . ToxASSURE Select 13 (MW), Urine    Imaging Ordered: None  Interventional Therapies: Scheduled:  Right sided lumbar facet radiofrequency neurotomy under fluoroscopic guidance and IV sedation.    Considering:  Bilateral lumbar facet radiofrequency.    PRN Procedures:  Palliative left-sided C7-T1 cervical epidural steroid injection  under fluoroscopic guidance and IV sedation.    Referral(s) or Consult(s): None at this time.  New Prescriptions   METAXALONE (SKELAXIN) 800 MG TABLET    Take 1 tablet (800 mg total) by mouth 3 (three) times daily.    Medications administered during this visit: Ms. Neeser had no medications administered during this visit.  Requested PM Follow-up: Return for Medication Management, (3-Mo), Procedure (Scheduled).  Future Appointments Date Time Provider  [place]  10/02/2015 1:00 PM Milinda Pointer, MD [facility] None    Primary Care Physician: Marcial Pacas, DO Location: [facility] Note by: Kathlen Brunswick. Dossie Arbour, M.D, DABA, DABAPM, DABPM, DABIPP, FIPP  Pain Score Disclaimer: We use the NRS-11 scale. This is a self-reported, subjective measurement of pain severity with only modest accuracy. It is used primarily to identify changes within a particular patient. It must be understood that outpatient pain scales are significantly less accurate that those used for research, where they can be applied under ideal controlled circumstances with minimal exposure to variables. In reality, the score is likely to be a combination of pain intensity and pain affect, where pain affect describes the degree of emotional arousal or changes in action readiness caused by the sensory experience of pain. Factors such as social and work situation, setting, emotional state, anxiety levels, expectation, and prior pain experience may influence pain perception and show large inter-individual differences that may also be affected by time variables.  Patient instructions provided during this appointment: Patient Instructions  Smoking Cessation, Tips for Success If you are ready to quit smoking, congratulations! You have chosen to help yourself be healthier. Cigarettes bring nicotine, tar, carbon monoxide, and other irritants into your body. Your lungs, heart, and blood vessels will be able to work better without these poisons. There are many different ways to quit smoking. Nicotine gum, nicotine patches, a nicotine inhaler, or nicotine nasal spray can help with physical craving. Hypnosis, support groups, and medicines help break the habit of smoking. WHAT THINGS CAN I DO TO MAKE QUITTING EASIER?  Here are some tips to help you quit for good:  Pick a date when you will quit smoking completely. Tell all of your friends and family about your  plan to quit on that date.  Do not try to slowly cut down on the number of cigarettes you are smoking. Pick a quit date and quit smoking completely starting on that day.  Throw away all cigarettes.   Clean and remove all ashtrays from your home, work, and car.  On a card, write down your reasons for quitting. Carry the card with you and read it when you get the urge to smoke.  Cleanse your body of nicotine. Drink enough water and fluids to keep your urine clear or pale yellow. Do this after quitting to flush the nicotine from your body.  Learn to predict your moods. Do not let a bad situation be your excuse to have a cigarette. Some situations in your life might tempt you into wanting a cigarette.  Never have "just one" cigarette. It leads to wanting another and another. Remind yourself of your decision to quit.  Change habits associated with smoking. If you smoked while driving or when feeling stressed, try other activities to replace smoking. Stand up when drinking your coffee. Brush your teeth after eating. Sit in a different chair when you read the paper. Avoid alcohol while trying to quit, and try to drink fewer caffeinated beverages. Alcohol and caffeine may urge you  to smoke.  Avoid foods and drinks that can trigger a desire to smoke, such as sugary or spicy foods and alcohol.  Ask people who smoke not to smoke around you.  Have something planned to do right after eating or having a cup of coffee. For example, plan to take a walk or exercise.  Try a relaxation exercise to calm you down and decrease your stress. Remember, you may be tense and nervous for the first 2 weeks after you quit, but this will pass.  Find new activities to keep your hands busy. Play with a pen, coin, or rubber band. Doodle or draw things on paper.  Brush your teeth right after eating. This will help cut down on the craving for the taste of tobacco after meals. You can also try mouthwash.   Use oral  substitutes in place of cigarettes. Try using lemon drops, carrots, cinnamon sticks, or chewing gum. Keep them handy so they are available when you have the urge to smoke.  When you have the urge to smoke, try deep breathing.  Designate your home as a nonsmoking area.  If you are a heavy smoker, ask your health care provider about a prescription for nicotine chewing gum. It can ease your withdrawal from nicotine.  Reward yourself. Set aside the cigarette money you save and buy yourself something nice.  Look for support from others. Join a support group or smoking cessation program. Ask someone at home or at work to help you with your plan to quit smoking.  Always ask yourself, "Do I need this cigarette or is this just a reflex?" Tell yourself, "Today, I choose not to smoke," or "I do not want to smoke." You are reminding yourself of your decision to quit.  Do not replace cigarette smoking with electronic cigarettes (commonly called e-cigarettes). The safety of e-cigarettes is unknown, and some may contain harmful chemicals.  If you relapse, do not give up! Plan ahead and think about what you will do the next time you get the urge to smoke. HOW WILL I FEEL WHEN I QUIT SMOKING? You may have symptoms of withdrawal because your body is used to nicotine (the addictive substance in cigarettes). You may crave cigarettes, be irritable, feel very hungry, cough often, get headaches, or have difficulty concentrating. The withdrawal symptoms are only temporary. They are strongest when you first quit but will go away within 10-14 days. When withdrawal symptoms occur, stay in control. Think about your reasons for quitting. Remind yourself that these are signs that your body is healing and getting used to being without cigarettes. Remember that withdrawal symptoms are easier to treat than the major diseases that smoking can cause.  Even after the withdrawal is over, expect periodic urges to smoke. However, these  cravings are generally short lived and will go away whether you smoke or not. Do not smoke! WHAT RESOURCES ARE AVAILABLE TO HELP ME QUIT SMOKING? Your health care provider can direct you to community resources or hospitals for support, which may include:  Group support.  Education.  Hypnosis.  Therapy.   This information is not intended to replace advice given to you by your health care provider. Make sure you discuss any questions you have with your health care provider.   Document Released: 10/27/2003 Document Revised: 02/18/2014 Document Reviewed: 07/16/2012 Elsevier Interactive Patient Education 2016 [place] tablets What is this medicine? METAXALONE (me TAX a lone) is a muscle relaxer. It is used to treat pain and stiffness  in muscles caused by strains, sprains, or other injury. This medicine may be used for other purposes; ask your health care provider or pharmacist if you have questions. What should I tell my health care provider before I take this medicine? They need to know if you have any of these conditions: -anemia or blood disorder -kidney disease -liver disease -an unusual or allergic reaction to metaxalone, other medicines, foods, dyes, or preservatives -pregnant or trying to get pregnant -breast-feeding How should I use this medicine? Take this medicine by mouth. Swallow it with a full glass of water. Follow the directions on the prescription label. Do not take more medicine than you are told to take. Talk to your pediatrician regarding the use of this medicine in children. While this drug may be prescribed for children as young as 54 years of age for selected conditions, precautions do apply. Overdosage: If you think you have taken too much of this medicine contact a poison control center or emergency room at once. NOTE: This medicine is only for you. Do not share this medicine with others. What if I miss a dose? If you miss a dose, take it as soon  as you can. If it is almost time for your next dose, take only that dose. Do not take double or extra doses. What may interact with this medicine? -alcohol or medicines that contain alcohol -antihistamines -medicines for depression, anxiety, and other mental conditions -medicines for pain like codeine, oxycodone, tramadol, and propoxyphene -medicines for sleep -phenobarbital This list may not describe all possible interactions. Give your health care provider a list of all the medicines, herbs, non-prescription drugs, or dietary supplements you use. Also tell them if you smoke, drink alcohol, or use illegal drugs. Some items may interact with your medicine. What should I watch for while using this medicine? Check with your doctor or health care professional if your condition does not improve within 1 to 3 weeks. You may get drowsy or dizzy when you first start taking the medicine or change doses. Do not drive, use machinery, or do anything that may be dangerous until you know how the medicine affects you. Stand or sit up slowly. What side effects may I notice from receiving this medicine? Side effects that you should report to your doctor or health care professional as soon as possible: -difficulty breathing -skin rash -unusually weak or tired -vomiting -swelling of face, lips, or tongue -yellow eyes or skin Side effects that usually do not require medical attention (report to your doctor or health care professional if they continue or are bothersome): -headache -irritability -nervousness -stomach upset This list may not describe all possible side effects. Call your doctor for medical advice about side effects. You may report side effects to FDA at 1-800-FDA-1088. Where should I keep my medicine? Keep out of the reach of children. Store at room temperature between 15 and 30 degrees C (59 and 86 degrees F). Keep container tightly closed. Throw away any unused medicine after the expiration  date. NOTE: This sheet is a summary. It may not cover all possible information. If you have questions about this medicine, talk to your doctor, pharmacist, or health care provider.    2016, Elsevier/[employer] Standard. (2013-08-12 15:32:46) Radiofrequency Lesioning Radiofrequency lesioning is a procedure that is performed to relieve pain. The procedure is often used for back, neck, or arm pain. Radiofrequency lesioning involves the use of a machine that creates radio waves to make heat. During the procedure, the heat is applied  to the nerve that carries the pain signal. The heat damages the nerve and interferes with the pain signal. Pain relief usually lasts for 6 months to 1 year. LET [facility] CARE PROVIDER KNOW ABOUT:  Any allergies you have.  All medicines you are taking, including vitamins, herbs, eye drops, creams, and over-the-counter medicines.  Previous problems you or members of your family have had with the use of anesthetics.  Any blood disorders you have.  Previous surgeries you have had.  Any medical conditions you have.  Whether you are pregnant or may be pregnant. RISKS AND COMPLICATIONS Generally, this is a safe procedure. However, problems may occur, including:  Pain or soreness at the injection site.  Infection at the injection site.  Damage to nerves or blood vessels. BEFORE THE PROCEDURE  Ask your health care provider about:  Changing or stopping your regular medicines. This is especially important if you are taking diabetes medicines or blood thinners.  Taking medicines such as aspirin and ibuprofen. These medicines can thin your blood. Do not take these medicines before your procedure if your health care provider instructs you not to.  Follow instructions from your health care provider about eating or drinking restrictions.  Plan to have someone take you home after the procedure.  If you go home right after the procedure, plan to have someone with you for  24 hours. PROCEDURE  You will be given one or more of the following:  A medicine to help you relax (sedative).  A medicine to numb the area (local anesthetic).  You will be awake during the procedure. You will need to be able to talk with the health care provider during the procedure.  With the help of a type of X-ray (fluoroscopy), the health care provider will insert a radiofrequency needle into the area to be treated.  Next, a wire that carries the radio waves (electrode) will be put through the radiofrequency needle. An electrical pulse will be sent through the electrode to verify the correct nerve. You will feel a tingling sensation, and you may have muscle twitching.  Then, the tissue that is around the needle tip will be heated by an electric current that is passed using the radiofrequency machine. This will numb the nerves.  A bandage (dressing) will be put on the insertion area after the procedure is done. The procedure may vary among health care providers and hospitals. AFTER THE PROCEDURE  Your blood pressure, heart rate, breathing rate, and blood oxygen level will be monitored often until the medicines you were given have worn off.  Return to your normal activities as directed by your health care provider.   This information is not intended to replace advice given to you by your health care provider. Make sure you discuss any questions you have with your health care provider.   Document Released: 09/26/2010 Document Revised: 10/19/2014 Document Reviewed: 03/07/2014 Elsevier Interactive Patient Education [employer].

## 2015-06-29 NOTE — Telephone Encounter (Signed)
Pt is aware that we have to get autho from her insurance for radiofrequency. i put the order in [place]...td

## 2015-07-07 LAB — TOXASSURE SELECT 13 (MW), URINE

## 2015-08-17 ENCOUNTER — Other Ambulatory Visit: Payer: Self-pay | Admitting: Family Medicine

## 2015-08-31 ENCOUNTER — Ambulatory Visit: Payer: BLUE CROSS/BLUE SHIELD | Attending: Pain Medicine | Admitting: Pain Medicine

## 2015-08-31 ENCOUNTER — Encounter: Payer: Self-pay | Admitting: Pain Medicine

## 2015-08-31 VITALS — BP 114/67 | HR 94 | Temp 98.2°F | Resp 16 | Ht 64.0 in | Wt 220.0 lb

## 2015-08-31 DIAGNOSIS — Z79891 Long term (current) use of opiate analgesic: Secondary | ICD-10-CM | POA: Insufficient documentation

## 2015-08-31 DIAGNOSIS — M542 Cervicalgia: Secondary | ICD-10-CM | POA: Diagnosis not present

## 2015-08-31 DIAGNOSIS — Z9689 Presence of other specified functional implants: Secondary | ICD-10-CM | POA: Insufficient documentation

## 2015-08-31 DIAGNOSIS — M545 Low back pain: Secondary | ICD-10-CM

## 2015-08-31 DIAGNOSIS — I10 Essential (primary) hypertension: Secondary | ICD-10-CM | POA: Diagnosis not present

## 2015-08-31 DIAGNOSIS — G8929 Other chronic pain: Secondary | ICD-10-CM | POA: Diagnosis not present

## 2015-08-31 DIAGNOSIS — J45901 Unspecified asthma with (acute) exacerbation: Secondary | ICD-10-CM | POA: Insufficient documentation

## 2015-08-31 DIAGNOSIS — E669 Obesity, unspecified: Secondary | ICD-10-CM | POA: Diagnosis not present

## 2015-08-31 DIAGNOSIS — F418 Other specified anxiety disorders: Secondary | ICD-10-CM | POA: Insufficient documentation

## 2015-08-31 DIAGNOSIS — M5416 Radiculopathy, lumbar region: Secondary | ICD-10-CM | POA: Diagnosis not present

## 2015-08-31 DIAGNOSIS — K589 Irritable bowel syndrome without diarrhea: Secondary | ICD-10-CM | POA: Insufficient documentation

## 2015-08-31 DIAGNOSIS — F411 Generalized anxiety disorder: Secondary | ICD-10-CM | POA: Insufficient documentation

## 2015-08-31 DIAGNOSIS — K219 Gastro-esophageal reflux disease without esophagitis: Secondary | ICD-10-CM | POA: Diagnosis not present

## 2015-08-31 DIAGNOSIS — M47816 Spondylosis without myelopathy or radiculopathy, lumbar region: Secondary | ICD-10-CM | POA: Insufficient documentation

## 2015-08-31 DIAGNOSIS — J209 Acute bronchitis, unspecified: Secondary | ICD-10-CM | POA: Diagnosis not present

## 2015-08-31 DIAGNOSIS — M791 Myalgia: Secondary | ICD-10-CM

## 2015-08-31 DIAGNOSIS — A419 Sepsis, unspecified organism: Secondary | ICD-10-CM | POA: Insufficient documentation

## 2015-08-31 DIAGNOSIS — F1190 Opioid use, unspecified, uncomplicated: Secondary | ICD-10-CM

## 2015-08-31 DIAGNOSIS — M7918 Myalgia, other site: Secondary | ICD-10-CM

## 2015-08-31 DIAGNOSIS — R252 Cramp and spasm: Secondary | ICD-10-CM

## 2015-08-31 DIAGNOSIS — Z87442 Personal history of urinary calculi: Secondary | ICD-10-CM | POA: Insufficient documentation

## 2015-08-31 DIAGNOSIS — M549 Dorsalgia, unspecified: Secondary | ICD-10-CM | POA: Diagnosis present

## 2015-08-31 DIAGNOSIS — E559 Vitamin D deficiency, unspecified: Secondary | ICD-10-CM | POA: Insufficient documentation

## 2015-08-31 DIAGNOSIS — M25559 Pain in unspecified hip: Secondary | ICD-10-CM | POA: Diagnosis present

## 2015-08-31 MED ORDER — OXYCODONE HCL 5 MG PO TABS: 5.0000 mg | ORAL_TABLET | ORAL | Status: DC | PRN

## 2015-08-31 MED ORDER — ROPIVACAINE HCL 2 MG/ML IJ SOLN
9.0000 mL | Freq: Once | INTRAMUSCULAR | Status: AC
  Administered 2015-08-31: 9 mL

## 2015-08-31 MED ORDER — METAXALONE 800 MG PO TABS: 800.0000 mg | ORAL_TABLET | Freq: Three times a day (TID) | ORAL | Status: DC

## 2015-08-31 MED ORDER — LIDOCAINE HCL (PF) 1 % IJ SOLN
10.0000 mL | Freq: Once | INTRAMUSCULAR | Status: AC
  Administered 2015-08-31: 10 mL

## 2015-08-31 MED ORDER — LACTATED RINGERS IV SOLN: 1000.0000 mL | Freq: Once | INTRAVENOUS | Status: DC

## 2015-08-31 MED ORDER — MIDAZOLAM HCL 5 MG/5ML IJ SOLN: 1.0000 mg | INTRAMUSCULAR | Status: DC | PRN

## 2015-08-31 MED ORDER — OXYCODONE HCL 5 MG PO TABS: 5.0000 mg | ORAL_TABLET | Freq: Four times a day (QID) | ORAL | Status: DC | PRN

## 2015-08-31 MED ORDER — DIPHENHYDRAMINE HCL 50 MG/ML IJ SOLN
INTRAMUSCULAR | Status: AC
  Filled 2015-08-31: qty 1

## 2015-08-31 MED ORDER — TRIAMCINOLONE ACETONIDE 40 MG/ML IJ SUSP
40.0000 mg | Freq: Once | INTRAMUSCULAR | Status: AC
  Administered 2015-08-31: 40 mg

## 2015-08-31 MED ORDER — FENTANYL CITRATE (PF) 100 MCG/2ML IJ SOLN: 25.0000 ug | INTRAMUSCULAR | Status: DC | PRN

## 2015-08-31 NOTE — Progress Notes (Signed)
Safety precautions to be maintained throughout the outpatient stay will include: orient to surroundings, keep bed in low position, maintain call bell within reach at all times, provide assistance with transfer out of bed and ambulation.  

## 2015-08-31 NOTE — Progress Notes (Signed)
Patient's Name: Mary Cruz  Patient type: Established  MRN: [id number]  Service setting: Ambulatory outpatient  DOB: 05-27-1975  Location: [facility]  DOS: 08/31/2015  Primary Care Physician: Mary Pacas, DO  Note by: Mary Cruz, M.D, DABA, DABAPM, DABPM, Milagros Evener, FIPP  Referring Physician: Milinda Pointer, MD  Specialty: Board-Certified Interventional Pain Management  Last Visit to Pain Management: 06/29/2015   Primary Reason(s) for Visit: Interventional Pain Management Treatment. CC: Back Pain and Hip Pain  Primary Diagnosis: Facet syndrome, lumbar [M54.5]   Procedure:  Anesthesia, Analgesia, Anxiolysis:  Type: Therapeutic Medial Branch Facet Radiofrequency Ablation Region: Lumbar Level: L2, L3, L4, L5, & S1 Medial Branch Level(s) Laterality: Right-Sided  Indications: 1. Lumbar facet syndrome (Bilateral) (R>L)   2. Lumbar spondylosis, unspecified spinal osteoarthritis   3. Chronic low back pain (Bilateral) (R>L)   4. Chronic pain   5. Long term current use of opiate analgesic   6. Opiate use (30 MME/Day)   7. Muscle cramping   8. Musculoskeletal pain     Pre-procedure Pain Score: 4/10  Reported level of pain is compatible with clinical observations Post-procedure Pain Score: 0-No pain  The patient has failed to respond to conservative therapies including over-the-counter medications, anti-inflammatories, muscle relaxants, membrane stabilizers, opioids, physical therapy, modalities such as heat and ice, as well as more invasive techniques such as nerve blocks. The patient did attained more than 50% relief of the pain from a series of diagnostic injections conducted in separate occasions.  Type: Moderate (Conscious) Sedation & Local Anesthesia Local Anesthetic: Lidocaine 1% Route: Intravenous (IV) IV Access: Secured Sedation: Meaningful verbal contact was maintained at all times during the procedure  Indication(s): Analgesia &  Anxiolysis   Pre-Procedure Assessment:  [name] is a 40 y.o. year old, female patient, seen today for interventional treatment. 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); Encounter for management of implanted device; Chronic low back pain (Bilateral) (R>L); Chronic lumbar radicular pain (Right L5 dermatome; Left S1 Dermatome) (Bilateral) (R>L); Chronic hip pain (Location of Tertiary source of pain) (Bilateral) (R>L); Chronic neck pain (Location of Primary Source of Pain) (Bilateral) (L>R); Chronic upper extremity pain (Location of Secondary source of pain) (Bilateral) (L>R); Morbid obesity ([place]); Nicotine dependence; Chronic cervical radicular pain (C7 Dermatome) (Bilateral) (L>R); Chronic lower extremity pain (Bilateral) (R>L); Right knee pain; Anxiety and depression; Acute bronchitis with asthma with acute exacerbation; Implant pain at battery site (right buttocks area); Lumbar spondylosis; and Lumbar facet syndrome (Bilateral) (R>L) on her problem list.. Her primarily concern today is the Back Pain and Hip Pain   Pain Type: Chronic pain Pain Location: Back Pain Orientation: Lower Pain Descriptors / Indicators: Aching, Burning Pain Frequency: Constant  Date of Last Visit: 06/29/15 Service Provided on Last Visit: Med Refill  Coagulation Parameters Lab Results  Component Value Date   PLT 357 10/21/2013    Verification of the correct person, correct site (including marking of site), and correct  procedure were performed and confirmed by the patient.  Consent: Secured. Under the influence of no sedatives a written informed consent was obtained, after having provided information on the risks  and possible complications. To fulfill our ethical and legal obligations, as recommended by the American Medical Association's Code of Ethics, we have provided information to the patient about our clinical impression; the nature and purpose of the treatment or procedure; the risks, benefits, and possible complications of the intervention; alternatives; the risk(s) and benefit(s) of the alternative treatment(s) or procedure(s); and the risk(s) and benefit(s) of doing nothing. The patient was provided information about the risks and possible complications associated with the procedure. These include, but are not limited to, failure to achieve desired goals, infection, bleeding, organ or nerve damage, allergic reactions, paralysis, and death. In the case of spinal procedures these may include, but are not limited to, failure to achieve desired goals, infection, bleeding, organ or nerve damage, allergic reactions, paralysis, and death. In addition, the patient was informed that Medicine is not an exact science; therefore, there is also the possibility of unforeseen risks and possible complications that may result in a catastrophic outcome. The patient indicated having understood very clearly. We have given the patient no guarantees and we have made no promises. Enough time was given to the patient to ask questions, all of which were answered to the patient's satisfaction.  Consent Attestation: I, the ordering provider, attest that I have discussed with the patient the benefits, risks, side-effects, alternatives, likelihood of achieving goals, and potential problems during recovery for the procedure that I have provided informed consent.  Pre-Procedure Preparation: Safety Precautions: Allergies reviewed. Appropriate  site, procedure, and patient were confirmed by following the Joint Commission's Universal Protocol (UP.01.01.01), in the form of a "Time Out". The patient was asked to confirm marked site and procedure, before commencing. The patient was asked about blood thinners, or active infections, both of which were denied. Patient was assessed for positional comfort and all pressure points were checked before starting procedure. Allergies: She is allergic to amoxicillin; bactrim; celebrex; doxycycline; erythromycin; hydromorphone hcl; latex; lyrica; nabumetone; and pentazocine lactate.. Infection Control Precautions: Aseptic technique used. Standard Universal Precautions were taken as recommended by the Department of [facility] for Disease Control and Prevention (CDC). Standard pre-surgical skin prep was conducted. Respiratory hygiene and cough etiquette was practiced. Hand hygiene observed. Safe injection practices and needle disposal techniques followed. SDV (single dose vial) medications used. Medications properly checked for expiration dates and contaminants. Personal protective equipment (PPE) used: Sterile Radiation-resistant gloves. Monitoring:  As per clinic protocol. Filed Vitals:   08/31/15 1242 08/31/15 1252 08/31/15 1302 08/31/15 1312  BP: 124/79 125/69 114/73 114/67  Pulse: 98 92 94 94  Temp:      TempSrc:      Resp: 13 16 16 16   Height:      Weight:      SpO2: 96% 92% 95% 95%  Calculated BMI: Body mass index is 37.74 kg/(m^2).  Description of Procedure Process:   Time-out: "Time-out" completed before starting procedure, as per protocol. Position: Prone Target Area: For Lumbar Facet blocks, the target is the groove formed by the junction of the transverse process and superior articular process. For the L5 dorsal ramus, the target is the notch between superior articular process and sacral ala. For the S1 dorsal ramus, the target is the superior and lateral edge of the posterior S1  Sacral foramen. Approach: Paraspinal approach. Area Prepped: Entire Posterior Lumbosacral Region Prepping solution: ChloraPrep (2% chlorhexidine gluconate and 70% isopropyl alcohol) Safety Precautions: Aspiration looking for blood return was conducted prior to all injections. At no point did we inject any substances,  as a needle was being advanced. No attempts were made at seeking any paresthesias. Safe injection practices and needle disposal techniques used. Medications properly checked for expiration dates. SDV (single dose vial) medications used. Latex Allergy precautions taken.   Description of the Procedure: Protocol guidelines were followed. The patient was placed in position over the fluoroscopy table. The target area was identified and the area prepped in the usual manner. Skin desensitized using vapocoolant spray. Skin & deeper tissues infiltrated with local anesthetic. Appropriate amount of time allowed to pass for local anesthetics to take effect. Radiofrequency needles were introduced to the area of the medial branch at the junction of the superior articular process and transverse process using fluoroscopy. Using the [employer], sensory stimulation using 50 Hz was used to locate & identify the nerve, making sure that the needle was positioned such that there was no sensory stimulation below 0.3 V or above 0.7 V. Stimulation using 2 Hz was used to evaluate the motor component. Care was taken not to lesion any nerves that demonstrated motor stimulation of the lower extremities at an output of less than 2.5 times that of the sensory threshold, or a maximum of 2.0 V. Once satisfactory placement of the needles was achieved, the above solution was slowly injected after negative aspiration. After waiting for at least 2 minutes, the ablation was performed at 80 degrees C for 60 seconds.The needles were then removed and the area cleansed, making sure to leave some of the prepping  solution back to take advantage of its long term bactericidal properties. EBL: None Materials & Medications Used:  Needle(s) Used: Teflon-Coated Radiofrequency Needles  Imaging Guidance:   Type of Imaging Technique: Fluoroscopy Guidance (Spinal) Indication(s): Assistance in needle guidance and placement for procedures requiring needle placement in or near specific anatomical locations not easily accessible without such assistance. Exposure Time: Please see nurses notes. Contrast: None required. Fluoroscopic Guidance: I was personally present in the fluoroscopy suite, where the patient was placed in position for the procedure, over the fluoroscopy-compatible table. Fluoroscopy was manipulated, using "Tunnel Vision Technique", to obtain the best possible view of the target area, on the affected side. Parallax error was corrected before commencing the procedure. A "direction-depth-direction" technique was used to introduce the needle under continuous pulsed fluoroscopic guidance. Once the target was reached, antero-posterior, oblique, and lateral fluoroscopic projection views were taken to confirm needle placement in all planes. Permanently recorded images stored by scanning into EMR. Interpretation: Intraoperative imaging interpretation by performing Physician. Adequate needle placement confirmed.  Antibiotic Prophylaxis:  Indication(s): No indications identified. Type:  Antibiotics Given (last 72 hours)    None       Post-operative Assessment:   Complications: No immediate post-treatment complications were observed Disposition: Return to clinic for follow-up evaluation. The patient tolerated the entire procedure well. A repeat set of vitals were taken after the procedure and the patient was kept under observation following institutional policy, for this procedure. Post-procedural neurological assessment was performed, showing return to baseline, prior to discharge. The patient was discharged  home, once institutional criteria were met. The patient was provided with post-procedure discharge instructions, including a section on how to identify potential problems. Should any problems arise concerning this procedure, the patient was given instructions to immediately contact us, at any time, without hesitation. In any case, we plan to contact the patient by telephone for a follow-up status report regarding this interventional procedure. Comments:  No additional relevant information  Medications administered during this visit: We  administered triamcinolone acetonide, lidocaine (PF), and ropivacaine (PF) 2 mg/ml (0.2%).  Prescriptions ordered during this visit: New Prescriptions   No medications on file    Requested PM Follow-up: Return in 2 months (on 11/13/2015) for Post-RF Eval (6-wk), Med-Mgmt, (3-Mo).  Future Appointments Date Time Provider [place]  11/14/2015 9:00 AM Mary Pointer, MD [facility] None    Primary Care Physician: Mary Pacas, DO Location: [facility] Note by: Mary Cruz, M.D, DABA, DABAPM, DABPM, DABIPP, FIPP Disclaimer:  Medicine is not an exact science. The only guarantee in medicine is that nothing is guaranteed. It is important to note that the decision to proceed with this intervention was based on the information collected from the patient. The Data and conclusions were drawn from the patient's questionnaire, the interview, and the physical examination. Because the information was provided in large part by the patient, it cannot be guaranteed that it has not been purposely or unconsciously manipulated. Every effort has been made to obtain as much relevant data as possible for this evaluation. It is important to note that the conclusions that lead to this procedure are derived in large part from the available data. Always take into account that the treatment will also be dependent on availability of resources and  existing treatment guidelines, considered by other Pain Management Practitioners as being common knowledge and practice, at the time of the intervention. For Medico-Legal purposes, it is also important to point out that variation in procedural techniques and pharmacological choices are the acceptable norm. The indications, contraindications, technique, and results of the above procedure should only be interpreted and judged by a Board-Certified Interventional Pain Specialist with extensive familiarity and expertise in the same exact procedure and technique. Attempts at providing opinions without similar or greater experience and expertise than that of the treating physician will be considered as inappropriate and unethical, and shall result in a formal complaint to the state medical board and applicable specialty societies.

## 2015-08-31 NOTE — Patient Instructions (Signed)
Radiofrequency Lesioning Radiofrequency lesioning is a procedure that is performed to relieve pain. The procedure is often used for back, neck, or arm pain. Radiofrequency lesioning involves the use of a machine that creates radio waves to make heat. During the procedure, the heat is applied to the nerve that carries the pain signal. The heat damages the nerve and interferes with the pain signal. Pain relief usually lasts for 6 months to 1 year. LET [facility] CARE PROVIDER KNOW ABOUT:  Any allergies you have.  All medicines you are taking, including vitamins, herbs, eye drops, creams, and over-the-counter medicines.  Previous problems you or members of your family have had with the use of anesthetics.  Any blood disorders you have.  Previous surgeries you have had.  Any medical conditions you have.  Whether you are pregnant or may be pregnant. RISKS AND COMPLICATIONS Generally, this is a safe procedure. However, problems may occur, including:  Pain or soreness at the injection site.  Infection at the injection site.  Damage to nerves or blood vessels. BEFORE THE PROCEDURE  Ask your health care provider about:  Changing or stopping your regular medicines. This is especially important if you are taking diabetes medicines or blood thinners.  Taking medicines such as aspirin and ibuprofen. These medicines can thin your blood. Do not take these medicines before your procedure if your health care provider instructs you not to.  Follow instructions from your health care provider about eating or drinking restrictions.  Plan to have someone take you home after the procedure.  If you go home right after the procedure, plan to have someone with you for 24 hours. PROCEDURE  You will be given one or more of the following:  A medicine to help you relax (sedative).  A medicine to numb the area (local anesthetic).  You will be awake during the procedure. You will need to be able to  talk with the health care provider during the procedure.  With the help of a type of X-ray (fluoroscopy), the health care provider will insert a radiofrequency needle into the area to be treated.  Next, a wire that carries the radio waves (electrode) will be put through the radiofrequency needle. An electrical pulse will be sent through the electrode to verify the correct nerve. You will feel a tingling sensation, and you may have muscle twitching.  Then, the tissue that is around the needle tip will be heated by an electric current that is passed using the radiofrequency machine. This will numb the nerves.  A bandage (dressing) will be put on the insertion area after the procedure is done. The procedure may vary among health care providers and hospitals. AFTER THE PROCEDURE  Your blood pressure, heart rate, breathing rate, and blood oxygen level will be monitored often until the medicines you were given have worn off.  Return to your normal activities as directed by your health care provider.   This information is not intended to replace advice given to you by your health care provider. Make sure you discuss any questions you have with your health care provider.   Document Released: 09/26/2010 Document Revised: 10/19/2014 Document Reviewed: 03/07/2014 Elsevier Interactive Patient Education 2016 [place] Facet Blocks Patient Information  Description: The facets are joints in the spine between the vertebrae.  Like any joints in the body, facets can become irritated and painful.  Arthritis can also effect the facets.  By injecting steroids and local anesthetic in and around these joints, we can  temporarily block the nerve supply to them.  Steroids act directly on irritated nerves and tissues to reduce selling and inflammation which often leads to decreased pain.  Facet blocks may be done anywhere along the spine from the neck to the low back depending upon the location of your  pain.   After numbing the skin with local anesthetic (like Novocaine), a small needle is passed onto the facet joints under x-ray guidance.  You may experience a sensation of pressure while this is being done.  The entire block usually lasts about 15-25 minutes.   Conditions which may be treated by facet blocks:   Low back/buttock pain  Neck/shoulder pain  Certain types of headaches  Preparation for the injection:   Do not eat any solid food or dairy products within 8 hours of your appointment.  You may drink clear liquid up to 3 hours before appointment.  Clear liquids include water, black coffee, juice or soda.  No milk or cream please.  You may take your regular medication, including pain medications, with a sip of water before your appointment.  Diabetics should hold regular insulin (if taken separately) and take 1/2 normal NPH dose the morning of the procedure.  Carry some sugar containing items with you to your appointment.  A driver must accompany you and be prepared to drive you home after your procedure.  Bring all your current medications with you.  An IV may be inserted and sedation may be given at the discretion of the physician.  A blood pressure cuff, EKG and other monitors will often be applied during the procedure.  Some patients may need to have extra oxygen administered for a short period.  You will be asked to provide medical information, including your allergies and medications, prior to the procedure.  We must know immediately if you are taking blood thinners (like Coumadin/Warfarin) or if you are allergic to IV iodine contrast (dye).  We must know if you could possible be pregnant.  Possible side-effects:   Bleeding from needle site  Infection (rare, may require surgery)  Nerve injury (rare)  Numbness & tingling (temporary)  Difficulty urinating (rare, temporary)  Spinal headache (a headache worse with upright posture)  Light-headedness  (temporary)  Pain at injection site (serveral days)  Decreased blood pressure (rare, temporary)  Weakness in arm/leg (temporary)  Pressure sensation in back/neck (temporary)   Call if you experience:   Fever/chills associated with headache or increased back/neck pain  Headache worsened by an upright position  New onset, weakness or numbness of an extremity below the injection site  Hives or difficulty breathing (go to the emergency room)  Inflammation or drainage at the injection site(s)  Severe back/neck pain greater than usual  New symptoms which are concerning to you  Please note:  Although the local anesthetic injected can often make your back or neck feel good for several hours after the injection, the pain will likely return. It takes 3-7 days for steroids to work.  You may not notice any pain relief for at least one week.  If effective, we will often do a series of 2-3 injections spaced 3-6 weeks apart to maximally decrease your pain.  After the initial series, you may be a candidate for a more permanent nerve block of the facets.  If you have any questions, please call #[phone]) [facility]

## 2015-09-01 ENCOUNTER — Telehealth: Payer: Self-pay | Admitting: *Deleted

## 2015-09-01 NOTE — Telephone Encounter (Signed)
Spoke with patient re; procedure on yesterday.  Denies any concerns.

## 2015-09-11 ENCOUNTER — Encounter: Payer: Self-pay | Admitting: Family Medicine

## 2015-09-11 ENCOUNTER — Ambulatory Visit (INDEPENDENT_AMBULATORY_CARE_PROVIDER_SITE_OTHER): Payer: BLUE CROSS/BLUE SHIELD | Admitting: Family Medicine

## 2015-09-11 VITALS — BP 112/73 | HR 100 | Wt 234.0 lb

## 2015-09-11 DIAGNOSIS — I10 Essential (primary) hypertension: Secondary | ICD-10-CM | POA: Diagnosis not present

## 2015-09-11 DIAGNOSIS — F329 Major depressive disorder, single episode, unspecified: Secondary | ICD-10-CM

## 2015-09-11 DIAGNOSIS — R252 Cramp and spasm: Secondary | ICD-10-CM | POA: Diagnosis not present

## 2015-09-11 DIAGNOSIS — F418 Other specified anxiety disorders: Secondary | ICD-10-CM

## 2015-09-11 DIAGNOSIS — M2550 Pain in unspecified joint: Secondary | ICD-10-CM | POA: Diagnosis not present

## 2015-09-11 DIAGNOSIS — F419 Anxiety disorder, unspecified: Secondary | ICD-10-CM

## 2015-09-11 DIAGNOSIS — F32A Depression, unspecified: Secondary | ICD-10-CM

## 2015-09-11 MED ORDER — FUROSEMIDE 20 MG PO TABS: 20.0000 mg | ORAL_TABLET | Freq: Every day | ORAL | 3 refills | Status: DC | PRN

## 2015-09-11 MED ORDER — DULOXETINE HCL 60 MG PO CPEP: 60.0000 mg | ORAL_CAPSULE | Freq: Two times a day (BID) | ORAL | 1 refills | Status: DC

## 2015-09-11 MED ORDER — LISINOPRIL-HYDROCHLOROTHIAZIDE 10-12.5 MG PO TABS: 1.0000 | ORAL_TABLET | Freq: Every day | ORAL | 2 refills | Status: DC

## 2015-09-11 MED ORDER — AMITRIPTYLINE HCL 100 MG PO TABS: ORAL_TABLET | ORAL | 1 refills | Status: DC

## 2015-09-11 NOTE — Patient Instructions (Signed)
Nasacort or Flonase for the nose.

## 2015-09-11 NOTE — Progress Notes (Signed)
CC: Mary Cruz is a 40 y.o. female is here for leg cramps (bilateral); Ear Fullness (left); and Fatigue   Subjective: HPI:  Follow-up essential hypertension: Requesting refills on lisinopril-HCTZ, no outside blood pressures to report other than her pain management clinic visits, all of which have been in the normotensive range. She is wondering if this medication is causing cramping in her legs. She gets this couple times a week and symptoms can be severe in severity. Episodes last 5-30 minutes. Nothing particularly makes it better or worse and she is aware of other than that above. Denies chest pain shortness of breath orthopnea nor peripheral edema  Follow-up anxiety and depression: She wants no shingle up on her Cymbalta. She feels even more stressed out with the adoption process of one of the children in her home. Legal proceedings are almost over and she is looking forward to this relief. She denies any thoughts of harm self or others she just feels stressed out and irritable.  She's concerned that maybe she doesn't have fibromyalgia and she's been misdiagnosed. She's never had an antineutrophil antibody done in the past. She's worried that some of her joint pain is due to a systemic condition.   Review Of Systems Outlined In HPI  Past Medical History:  Diagnosis Date  . Fibromyalgia   . Hypertension   . Osteoarthritis   . Pain    chronic regional pain syndrome    Past Surgical History:  Procedure Laterality Date  . CHOLECYSTECTOMY  2000  . ENDOMETRIAL ABLATION    . KNEE SURGERY    . KNEE SURGERY Right 02-2015  . OCCIPITAL NERVE STIMULATOR INSERTION    . SHOULDER SURGERY    . spinal cord stimulator    . SPINAL CORD STIMULATOR IMPLANT  2008  . TUBAL LIGATION  2000   Family History  Problem Relation Age of Onset  . Depression Mother     maternal grandmother  . Asthma Mother   . Fibromyalgia      aunt  . Stroke      grandmother  . Thyroid disease      grandmother     Social History   Social History  . Marital status: Married    Spouse name: N/A  . Number of children: N/A  . Years of education: N/A   Occupational History  . Not on file.   Social History Main Topics  . Smoking status: Former Smoker    Quit date: 08/01/2015  . Smokeless tobacco: Not on file  . Alcohol use No  . Drug use: No  . Sexual activity: Yes    Partners: Male    Birth control/ protection: Surgical   Other Topics Concern  . Not on file   Social History Narrative  . No narrative on file     Objective: BP 112/73   Pulse 100   Wt 234 lb (106.1 kg)   BMI 40.17 kg/m   Vital signs reviewed. General: Alert and Oriented, No Acute Distress HEENT: Pupils equal, round, reactive to light. Conjunctivae clear.  External ears unremarkable.  Moist mucous membranes. Lungs: Clear and comfortable work of breathing, speaking in full sentences without accessory muscle use. Cardiac: Regular rate and rhythm.  Neuro: CN II-XII grossly intact, gait normal. Extremities: No peripheral edema.  Strong peripheral pulses.  Mental Status: No depression, anxiety, nor agitation. Logical though process. Skin: Warm and dry.  Assessment & Plan: Mary Cruz was seen today for leg cramps, ear fullness and fatigue.  Diagnoses  and all orders for this visit:  Essential hypertension, benign  Anxiety and depression -     DULoxetine (CYMBALTA) 60 MG capsule; Take 1 capsule (60 mg total) by mouth 2 (two) times daily.  Muscle cramping -     COMPLETE METABOLIC PANEL WITH GFR  Pain, joint, multiple sites -     Antinuclear Antib (ANA) -     TSH  Other orders -     amitriptyline (ELAVIL) 100 MG tablet; TAKE 1 TABLET (100 MG TOTAL) BY MOUTH AT BEDTIME. -     furosemide (LASIX) 20 MG tablet; Take 1-2 tablets (20-40 mg total) by mouth daily as needed for fluid. -     lisinopril-hydrochlorothiazide (PRINZIDE,ZESTORETIC) 10-12.5 MG tablet; Take 1 tablet by mouth daily.  Essential hypertension:  Controlled continue lisinopril-hydrochlorothiazide, checking potassium to see if she needs to be on a potassium supplement especially given that she is taking Lasix with hydrochlorothiazide. Anxiety and depression: Slightly uncontrolled, increasing Cymbalta regimen. Checking TSH to make sure that this is not contribute to some of her psychiatric symptoms. Multiple joint pain: Screen for autoimmune disease with ANA   Return in about 4 weeks (around 10/09/2015) for Mood.

## 2015-09-12 ENCOUNTER — Telehealth: Payer: Self-pay | Admitting: Family Medicine

## 2015-09-12 LAB — COMPLETE METABOLIC PANEL WITH GFR
ALT: 19 U/L (ref 6–29)
AST: 16 U/L (ref 10–30)
Albumin: 4.3 g/dL (ref 3.6–5.1)
Alkaline Phosphatase: 103 U/L (ref 33–115)
BILIRUBIN TOTAL: 0.5 mg/dL (ref 0.2–1.2)
BUN: 18 mg/dL (ref 7–25)
CO2: 28 mmol/L (ref 20–31)
Calcium: 9.8 mg/dL (ref 8.6–10.2)
Chloride: 97 mmol/L — ABNORMAL LOW (ref 98–110)
Creat: 1.28 mg/dL — ABNORMAL HIGH (ref 0.50–1.10)
GFR, EST AFRICAN AMERICAN: 61 mL/min (ref >=60)
GFR, EST NON AFRICAN AMERICAN: 53 mL/min — AB (ref >=60)
GLUCOSE: 79 mg/dL (ref 65–99)
POTASSIUM: 4.2 mmol/L (ref 3.5–5.3)
SODIUM: 138 mmol/L (ref 135–146)
TOTAL PROTEIN: 7.6 g/dL (ref 6.1–8.1)

## 2015-09-12 LAB — ANTI-NUCLEAR AB-TITER (ANA TITER)

## 2015-09-12 LAB — TSH: TSH: 1.84 mIU/L

## 2015-09-12 LAB — ANA: Anti Nuclear Antibody(ANA): POSITIVE — AB

## 2015-09-12 MED ORDER — LISINOPRIL-HYDROCHLOROTHIAZIDE 10-12.5 MG PO TABS: 0.5000 | ORAL_TABLET | Freq: Every day | ORAL | 1 refills | Status: DC

## 2015-09-12 NOTE — Telephone Encounter (Signed)
vm is not set up

## 2015-09-12 NOTE — Telephone Encounter (Signed)
Will you Please let patient know that her kidney function is slightly depressed due to the combination of lisinopril/HCTZ and using Lasix on a daily basis. I would recommend that she cut her lisinopril/HCTZ to a half tablet daily, a new perception was sent [facility] her [facility]. I would recommend that we recheck her kidney function in one month. Also thyroid function was normal.

## 2015-09-13 ENCOUNTER — Telehealth: Payer: Self-pay | Admitting: Family Medicine

## 2015-09-13 DIAGNOSIS — G894 Chronic pain syndrome: Secondary | ICD-10-CM

## 2015-09-13 DIAGNOSIS — R768 Other specified abnormal immunological findings in serum: Secondary | ICD-10-CM

## 2015-09-13 HISTORY — DX: Other specified abnormal immunological findings in serum: R76.8

## 2015-09-13 NOTE — Telephone Encounter (Signed)
Left vm for pt to return call to clinic  

## 2015-09-13 NOTE — Telephone Encounter (Signed)
Pt.notified

## 2015-09-13 NOTE — Telephone Encounter (Signed)
In addition to yesterday's advice in the phone note...  We please let patient know that the screening test for autoimmune disease was positive meaning that further workup is warranted.  I would recommend she meet with a rheumatologist to help pinpoint the exact labs for further working this up. This will help limit the number of labs that she has to pay for versus a shotgun approach. Referral has been placed.

## 2015-09-14 ENCOUNTER — Telehealth: Payer: Self-pay | Admitting: Family Medicine

## 2015-09-14 MED ORDER — OMEPRAZOLE 40 MG PO CPDR: 40.0000 mg | DELAYED_RELEASE_CAPSULE | Freq: Two times a day (BID) | ORAL | 1 refills | Status: DC

## 2015-09-14 NOTE — Telephone Encounter (Signed)
Refill req 

## 2015-10-02 ENCOUNTER — Encounter: Payer: BLUE CROSS/BLUE SHIELD | Admitting: Pain Medicine

## 2015-11-14 ENCOUNTER — Encounter: Payer: BLUE CROSS/BLUE SHIELD | Admitting: Pain Medicine

## 2015-11-20 ENCOUNTER — Encounter: Payer: BLUE CROSS/BLUE SHIELD | Admitting: Pain Medicine

## 2015-11-24 ENCOUNTER — Emergency Department (HOSPITAL_COMMUNITY): Payer: BLUE CROSS/BLUE SHIELD

## 2015-11-24 ENCOUNTER — Emergency Department (HOSPITAL_COMMUNITY)
Admission: EM | Admit: 2015-11-24 | Discharge: 2015-11-24 | Disposition: A | Discharge status: Home / Self Care | Payer: BLUE CROSS/BLUE SHIELD | Attending: Emergency Medicine | Admitting: Emergency Medicine

## 2015-11-24 ENCOUNTER — Encounter (HOSPITAL_COMMUNITY): Payer: Self-pay | Admitting: *Deleted

## 2015-11-24 DIAGNOSIS — Y999 Unspecified external cause status: Secondary | ICD-10-CM | POA: Insufficient documentation

## 2015-11-24 DIAGNOSIS — Y92410 Unspecified street and highway as the place of occurrence of the external cause: Secondary | ICD-10-CM | POA: Diagnosis not present

## 2015-11-24 DIAGNOSIS — S161XXA Strain of muscle, fascia and tendon at neck level, initial encounter: Secondary | ICD-10-CM | POA: Insufficient documentation

## 2015-11-24 DIAGNOSIS — I10 Essential (primary) hypertension: Secondary | ICD-10-CM | POA: Diagnosis not present

## 2015-11-24 DIAGNOSIS — R0789 Other chest pain: Secondary | ICD-10-CM | POA: Insufficient documentation

## 2015-11-24 DIAGNOSIS — Z79899 Other long term (current) drug therapy: Secondary | ICD-10-CM | POA: Insufficient documentation

## 2015-11-24 DIAGNOSIS — R51 Headache: Secondary | ICD-10-CM | POA: Insufficient documentation

## 2015-11-24 DIAGNOSIS — Z87891 Personal history of nicotine dependence: Secondary | ICD-10-CM | POA: Insufficient documentation

## 2015-11-24 DIAGNOSIS — Y9389 Activity, other specified: Secondary | ICD-10-CM | POA: Diagnosis not present

## 2015-11-24 DIAGNOSIS — S39012A Strain of muscle, fascia and tendon of lower back, initial encounter: Secondary | ICD-10-CM | POA: Insufficient documentation

## 2015-11-24 DIAGNOSIS — S63502A Unspecified sprain of left wrist, initial encounter: Secondary | ICD-10-CM | POA: Diagnosis not present

## 2015-11-24 DIAGNOSIS — S199XXA Unspecified injury of neck, initial encounter: Secondary | ICD-10-CM | POA: Diagnosis present

## 2015-11-24 HISTORY — DX: Systemic lupus erythematosus, unspecified: M32.9

## 2015-11-24 LAB — BASIC METABOLIC PANEL
Anion gap: 8 (ref 5–15)
BUN: 11 mg/dL (ref 6–20)
CHLORIDE: 105 mmol/L (ref 101–111)
CO2: 25 mmol/L (ref 22–32)
CREATININE: 0.94 mg/dL (ref 0.44–1.00)
Calcium: 9.2 mg/dL (ref 8.9–10.3)
GFR calc non Af Amer: >60 mL/min (ref >60)
Glucose, Bld: 115 mg/dL — ABNORMAL HIGH (ref 65–99)
POTASSIUM: 3.7 mmol/L (ref 3.5–5.1)
SODIUM: 138 mmol/L (ref 135–145)

## 2015-11-24 LAB — CBC
HEMATOCRIT: 36.8 % (ref 36.0–46.0)
Hemoglobin: 11.9 g/dL — ABNORMAL LOW (ref 12.0–15.0)
MCH: 29.5 pg (ref 26.0–34.0)
MCHC: 32.3 g/dL (ref 30.0–36.0)
MCV: 91.3 fL (ref 78.0–100.0)
PLATELETS: 276 10*3/uL (ref 150–400)
RBC: 4.03 MIL/uL (ref 3.87–5.11)
RDW: 12.6 % (ref 11.5–15.5)
WBC: 10.8 10*3/uL — AB (ref 4.0–10.5)

## 2015-11-24 MED ORDER — ONDANSETRON HCL 4 MG/2ML IJ SOLN
4.0000 mg | Freq: Once | INTRAMUSCULAR | Status: DC
  Filled 2015-11-24: qty 2

## 2015-11-24 MED ORDER — SODIUM CHLORIDE 0.9 % IV BOLUS (SEPSIS)
500.0000 mL | Freq: Once | INTRAVENOUS | Status: AC
Start: 2015-11-24 — End: 2015-11-24
  Administered 2015-11-24: 500 mL via INTRAVENOUS

## 2015-11-24 MED ORDER — SODIUM CHLORIDE 0.9 % IV BOLUS (SEPSIS): 250.0000 mL | Freq: Once | INTRAVENOUS | Status: DC

## 2015-11-24 MED ORDER — FENTANYL CITRATE (PF) 100 MCG/2ML IJ SOLN
50.0000 ug | Freq: Once | INTRAMUSCULAR | Status: AC
  Administered 2015-11-24: 50 ug via INTRAVENOUS
  Filled 2015-11-24: qty 2

## 2015-11-24 MED ORDER — IOPAMIDOL (ISOVUE-300) INJECTION 61%
100.0000 mL | Freq: Once | INTRAVENOUS | Status: AC | PRN
  Administered 2015-11-24: 100 mL via INTRAVENOUS

## 2015-11-24 MED ORDER — HYDROCODONE-ACETAMINOPHEN 5-325 MG PO TABS: 1.0000 | ORAL_TABLET | Freq: Four times a day (QID) | ORAL | 0 refills | Status: DC | PRN

## 2015-11-24 MED ORDER — SODIUM CHLORIDE 0.9 % IV SOLN: INTRAVENOUS | Status: DC

## 2015-11-24 NOTE — ED Notes (Signed)
Velcro splint applied, no complaints, Patient given discharge instruction, verbalized understand. IV removed, band aid applied. Patient ambulatory out of the department.

## 2015-11-24 NOTE — ED Notes (Signed)
Returned from xray
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
## 2015-11-24 NOTE — ED Notes (Signed)
MD at the bedside  

## 2015-11-24 NOTE — Discharge Instructions (Signed)
CT scans and x-rays without any acute findings. Expect to be sore and stiff for the next several days. We'll place the left hand and wrist in a Velcro splint. If the wrist is still tender after a week follow-up with Dr. Aline Brochure from orthopedics. Could be a sign of a scaphoid bone injury in the wrist which does not often show up on x-rays.
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
## 2015-11-29 ENCOUNTER — Encounter: Payer: BLUE CROSS/BLUE SHIELD | Admitting: Pain Medicine

## 2015-11-29 ENCOUNTER — Encounter: Payer: Self-pay | Admitting: Pain Medicine

## 2015-11-29 ENCOUNTER — Ambulatory Visit: Payer: BLUE CROSS/BLUE SHIELD | Attending: Pain Medicine | Admitting: Pain Medicine

## 2015-11-29 VITALS — BP 155/88 | HR 104 | Temp 98.5°F | Resp 16 | Ht 64.0 in | Wt 244.0 lb

## 2015-11-29 DIAGNOSIS — Z7984 Long term (current) use of oral hypoglycemic drugs: Secondary | ICD-10-CM | POA: Insufficient documentation

## 2015-11-29 DIAGNOSIS — M488X6 Other specified spondylopathies, lumbar region: Secondary | ICD-10-CM | POA: Insufficient documentation

## 2015-11-29 DIAGNOSIS — Z825 Family history of asthma and other chronic lower respiratory diseases: Secondary | ICD-10-CM | POA: Diagnosis not present

## 2015-11-29 DIAGNOSIS — M25551 Pain in right hip: Secondary | ICD-10-CM | POA: Diagnosis not present

## 2015-11-29 DIAGNOSIS — Z6841 Body Mass Index (BMI) 40.0 and over, adult: Secondary | ICD-10-CM | POA: Insufficient documentation

## 2015-11-29 DIAGNOSIS — Z818 Family history of other mental and behavioral disorders: Secondary | ICD-10-CM | POA: Insufficient documentation

## 2015-11-29 DIAGNOSIS — M545 Low back pain: Secondary | ICD-10-CM | POA: Diagnosis present

## 2015-11-29 DIAGNOSIS — Z87891 Personal history of nicotine dependence: Secondary | ICD-10-CM | POA: Insufficient documentation

## 2015-11-29 DIAGNOSIS — M25552 Pain in left hip: Secondary | ICD-10-CM | POA: Diagnosis not present

## 2015-11-29 DIAGNOSIS — G894 Chronic pain syndrome: Secondary | ICD-10-CM

## 2015-11-29 DIAGNOSIS — M7918 Myalgia, other site: Secondary | ICD-10-CM

## 2015-11-29 DIAGNOSIS — R252 Cramp and spasm: Secondary | ICD-10-CM | POA: Diagnosis not present

## 2015-11-29 DIAGNOSIS — F329 Major depressive disorder, single episode, unspecified: Secondary | ICD-10-CM | POA: Diagnosis not present

## 2015-11-29 DIAGNOSIS — K589 Irritable bowel syndrome without diarrhea: Secondary | ICD-10-CM | POA: Diagnosis not present

## 2015-11-29 DIAGNOSIS — I10 Essential (primary) hypertension: Secondary | ICD-10-CM | POA: Insufficient documentation

## 2015-11-29 DIAGNOSIS — M1288 Other specific arthropathies, not elsewhere classified, other specified site: Secondary | ICD-10-CM

## 2015-11-29 DIAGNOSIS — F419 Anxiety disorder, unspecified: Secondary | ICD-10-CM | POA: Insufficient documentation

## 2015-11-29 DIAGNOSIS — Z91040 Latex allergy status: Secondary | ICD-10-CM | POA: Diagnosis not present

## 2015-11-29 DIAGNOSIS — Z79891 Long term (current) use of opiate analgesic: Secondary | ICD-10-CM | POA: Insufficient documentation

## 2015-11-29 DIAGNOSIS — M791 Myalgia: Secondary | ICD-10-CM

## 2015-11-29 DIAGNOSIS — Z888 Allergy status to other drugs, medicaments and biological substances status: Secondary | ICD-10-CM | POA: Diagnosis not present

## 2015-11-29 DIAGNOSIS — E559 Vitamin D deficiency, unspecified: Secondary | ICD-10-CM | POA: Insufficient documentation

## 2015-11-29 DIAGNOSIS — Z885 Allergy status to narcotic agent status: Secondary | ICD-10-CM | POA: Diagnosis not present

## 2015-11-29 DIAGNOSIS — G8929 Other chronic pain: Secondary | ICD-10-CM

## 2015-11-29 DIAGNOSIS — Z79899 Other long term (current) drug therapy: Secondary | ICD-10-CM | POA: Diagnosis not present

## 2015-11-29 DIAGNOSIS — M47816 Spondylosis without myelopathy or radiculopathy, lumbar region: Secondary | ICD-10-CM

## 2015-11-29 DIAGNOSIS — M797 Fibromyalgia: Secondary | ICD-10-CM | POA: Diagnosis not present

## 2015-11-29 DIAGNOSIS — Z882 Allergy status to sulfonamides status: Secondary | ICD-10-CM | POA: Diagnosis not present

## 2015-11-29 MED ORDER — OXYCODONE HCL 5 MG PO TABS: 5.0000 mg | ORAL_TABLET | Freq: Four times a day (QID) | ORAL | 0 refills | Status: DC | PRN

## 2015-11-29 MED ORDER — METAXALONE 800 MG PO TABS: 800.0000 mg | ORAL_TABLET | Freq: Three times a day (TID) | ORAL | 0 refills | Status: DC

## 2015-11-29 NOTE — Progress Notes (Signed)
Nursing Pain Medication Assessment:  Safety precautions to be maintained throughout the outpatient stay will include: orient to surroundings, keep bed in low position, maintain call bell within reach at all times, provide assistance with transfer out of bed and ambulation.  Medication Inspection Compliance: Pill count conducted under aseptic conditions, in front of the patient. Neither the pills nor the bottle was removed from the patient's sight at any time. Once count was completed pills were immediately returned to the patient in their original bottle. Pill Count: 15 of 120 pills remain Bottle Appearance: Standard pharmacy container. Clearly labeled. Medication: Oxycodone IR Filled Date: 54 / 21 / 2017
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
## 2015-11-29 NOTE — Patient Instructions (Signed)
You were given 3 prescriptions for Hydrocodone today. Radiofrequency Lesioning Radiofrequency lesioning is a procedure that is performed to relieve pain. The procedure is often used for back, neck, or arm pain. Radiofrequency lesioning involves the use of a machine that creates radio waves to make heat. During the procedure, the heat is applied to the nerve that carries the pain signal. The heat damages the nerve and interferes with the pain signal. Pain relief usually lasts for 6 months to 1 year. LET [facility] CARE PROVIDER KNOW ABOUT:  Any allergies you have.  All medicines you are taking, including vitamins, herbs, eye drops, creams, and over-the-counter medicines.  Previous problems you or members of your family have had with the use of anesthetics.  Any blood disorders you have.  Previous surgeries you have had.  Any medical conditions you have.  Whether you are pregnant or may be pregnant. RISKS AND COMPLICATIONS Generally, this is a safe procedure. However, problems may occur, including:  Pain or soreness at the injection site.  Infection at the injection site.  Damage to nerves or blood vessels. BEFORE THE PROCEDURE  Ask your health care provider about:  Changing or stopping your regular medicines. This is especially important if you are taking diabetes medicines or blood thinners.  Taking medicines such as aspirin and ibuprofen. These medicines can thin your blood. Do not take these medicines before your procedure if your health care provider instructs you not to.  Follow instructions from your health care provider about eating or drinking restrictions.  Plan to have someone take you home after the procedure.  If you go home right after the procedure, plan to have someone with you for 24 hours. PROCEDURE  You will be given one or more of the following:  A medicine to help you relax (sedative).  A medicine to numb the area (local anesthetic).  You will be  awake during the procedure. You will need to be able to talk with the health care provider during the procedure.  With the help of a type of X-ray (fluoroscopy), the health care provider will insert a radiofrequency needle into the area to be treated.  Next, a wire that carries the radio waves (electrode) will be put through the radiofrequency needle. An electrical pulse will be sent through the electrode to verify the correct nerve. You will feel a tingling sensation, and you may have muscle twitching.  Then, the tissue that is around the needle tip will be heated by an electric current that is passed using the radiofrequency machine. This will numb the nerves.  A bandage (dressing) will be put on the insertion area after the procedure is done. The procedure may vary among health care providers and hospitals. AFTER THE PROCEDURE  Your blood pressure, heart rate, breathing rate, and blood oxygen level will be monitored often until the medicines you were given have worn off.  Return to your normal activities as directed by your health care provider.   This information is not intended to replace advice given to you by your health care provider. Make sure you discuss any questions you have with your health care provider.   Document Released: 09/26/2010 Document Revised: 10/19/2014 Document Reviewed: 03/07/2014 [employer] Interactive Patient Education [employer].

## 2015-11-29 NOTE — Progress Notes (Signed)
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    Sig: Take 1 tablet (5 mg total) by mouth every 6 (six) hours as needed for severe pain.    Dispense:  120 tablet    Refill:  0    Do not add this medication to the electronic "Automatic Refill" notification system. Patient may have prescription filled one day early if pharmacy is closed on scheduled refill date. Do not fill until: 12/01/15 To last until: 12/31/15  . oxyCODONE (OXY IR/ROXICODONE) 5 MG immediate release tablet    Sig: Take 1 tablet (5 mg total) by mouth every 6 (six) hours as needed for severe pain.    Dispense:  120 tablet    Refill:  0    Do not add this medication to the electronic "Automatic Refill" notification system. Patient may have prescription filled one day early if pharmacy is closed on scheduled refill date. Do not fill until: 12/31/15 To last until: 01/30/16  . oxyCODONE (OXY IR/ROXICODONE) 5 MG immediate release tablet    Sig: Take 1 tablet (5 mg total) by mouth every 6 (six) hours as needed for severe pain.    Dispense:  120 tablet    Refill:  0    Do not add this medication  to the electronic "Automatic Refill" notification system. Patient may have prescription filled one day early if pharmacy is closed on scheduled refill date. Do not fill until: 01/30/16 To last until: 02/29/16  . metaxalone (SKELAXIN) 800 MG tablet    Sig: Take 1 tablet (800 mg total) by mouth 3 (three) times daily.    Dispense:  270 tablet    Refill:  0    Do not place this medication, or any other prescription from our practice, on "Automatic Refill". Patient may have prescription filled one day early if pharmacy is closed on scheduled refill date.   New Prescriptions  OXYCODONE (OXY IR/ROXICODONE) 5 MG IMMEDIATE RELEASE TABLET    Take 1 tablet (5 mg total) by mouth every 6 (six) hours as needed for severe pain.   OXYCODONE (OXY IR/ROXICODONE) 5 MG IMMEDIATE RELEASE TABLET    Take 1 tablet (5 mg total) by mouth every 6 (six) hours as needed for severe pain.   OXYCODONE (OXY IR/ROXICODONE) 5 MG IMMEDIATE RELEASE TABLET    Take 1 tablet (5 mg total) by mouth every 6 (six) hours as needed for severe pain.   Medications administered during this visit: Mary Cruz had no medications administered during this visit. Lab-work, Procedure(s), & Referral(s) Ordered: Orders Placed This Encounter  Procedures  . Radiofrequency,Lumbar   Imaging & Referral(s) Ordered: None  Interventional Therapies: Scheduled:  Left sided lumbar facet radiofrequency neurotomy under fluoroscopic guidance and IV sedation.    Considering:  Bilateral lumbar facet radiofrequency.    PRN Procedures:  Palliative left-sided C7-T1 cervical epidural steroid injection under fluoroscopic guidance and IV sedation.    Requested PM Follow-up: Return in about 3 months (around 02/29/2016) for Med-Mgmt, In addition, Schedule Procedure, ([facility]).  Future Appointments Date Time Provider [place]  02/27/2016 10:30 AM Milinda Pointer, MD [facility] None   Primary Care Physician: Aundria Mems, MD Location: [facility]  Outpatient Pain Management Facility Note by: Kathlen Brunswick. Dossie Arbour, M.D, DABA, DABAPM, DABPM, DABIPP, FIPP  Pain Score Disclaimer: We use the NRS-11 scale. This is a self-reported, subjective measurement of pain severity with only modest accuracy. It is used primarily to identify changes within a particular patient. It must be understood that outpatient pain scales are significantly less accurate that those used for research, where they can be applied under ideal controlled circumstances with minimal exposure to variables. In reality, the score is likely to be a combination of pain intensity and pain affect, where pain affect describes the degree of emotional arousal or changes in action readiness caused by the sensory experience of pain. Factors such as social and work situation, setting, emotional state, anxiety levels, expectation, and prior pain experience may influence pain perception and show large inter-individual differences that may also be affected by time variables.  Patient instructions provided during this appointment: Patient Instructions  You were given 3 prescriptions for Hydrocodone today. Radiofrequency Lesioning Radiofrequency lesioning is a procedure that is performed to relieve pain. The procedure is often used for back, neck, or arm pain. Radiofrequency lesioning involves the use of a machine that creates radio waves to make heat. During the procedure, the heat is applied to the nerve that carries the pain signal. The heat damages the nerve and interferes with the pain signal. Pain relief usually lasts for 6 months to 1 year. LET [facility] CARE PROVIDER KNOW ABOUT:  Any allergies you have.  All medicines you are taking, including vitamins, herbs, eye drops, creams, and over-the-counter medicines.  Previous problems you or members of your family have had with the use of anesthetics.  Any blood disorders you have.  Previous surgeries you have had.  Any medical conditions  you have.  Whether you are pregnant or may be pregnant. RISKS AND COMPLICATIONS Generally, this is a safe procedure. However, problems may occur, including:  Pain or soreness at the injection site.  Infection at the injection site.  Damage to nerves or blood vessels. BEFORE THE PROCEDURE  Ask your health care provider about:  Changing or stopping your regular medicines. This is especially important if you are taking diabetes medicines or blood thinners.  Taking medicines  such as aspirin and ibuprofen. These medicines can thin your blood. Do not take these medicines before your procedure if your health care provider instructs you not to.  Follow instructions from your health care provider about eating or drinking restrictions.  Plan to have someone take you home after the procedure.  If you go home right after the procedure, plan to have someone with you for 24 hours. PROCEDURE  You will be given one or more of the following:  A medicine to help you relax (sedative).  A medicine to numb the area (local anesthetic).  You will be awake during the procedure. You will need to be able to talk with the health care provider during the procedure.  With the help of a type of X-ray (fluoroscopy), the health care provider will insert a radiofrequency needle into the area to be treated.  Next, a wire that carries the radio waves (electrode) will be put through the radiofrequency needle. An electrical pulse will be sent through the electrode to verify the correct nerve. You will feel a tingling sensation, and you may have muscle twitching.  Then, the tissue that is around the needle tip will be heated by an electric current that is passed using the radiofrequency machine. This will numb the nerves.  A bandage (dressing) will be put on the insertion area after the procedure is done. The procedure may vary among health care providers and hospitals. AFTER THE PROCEDURE  Your blood  pressure, heart rate, breathing rate, and blood oxygen level will be monitored often until the medicines you were given have worn off.  Return to your normal activities as directed by your health care provider.   This information is not intended to replace advice given to you by your health care provider. Make sure you discuss any questions you have with your health care provider.   Document Released: 09/26/2010 Document Revised: 10/19/2014 Document Reviewed: 03/07/2014 Elsevier Interactive Patient Education [employer].

## 2016-02-21 ENCOUNTER — Encounter: Payer: Self-pay | Admitting: Pain Medicine

## 2016-02-21 ENCOUNTER — Ambulatory Visit (HOSPITAL_BASED_OUTPATIENT_CLINIC_OR_DEPARTMENT_OTHER): Payer: BLUE CROSS/BLUE SHIELD | Admitting: Pain Medicine

## 2016-02-21 ENCOUNTER — Ambulatory Visit
Admission: RE | Admit: 2016-02-21 | Discharge: 2016-02-21 | Disposition: A | Discharge status: Home / Self Care | Payer: BLUE CROSS/BLUE SHIELD | Source: Ambulatory Visit | Attending: Pain Medicine | Admitting: Pain Medicine

## 2016-02-21 VITALS — BP 136/76 | HR 86 | Temp 99.5°F | Resp 17 | Ht 64.0 in | Wt 242.0 lb

## 2016-02-21 DIAGNOSIS — G8929 Other chronic pain: Secondary | ICD-10-CM | POA: Insufficient documentation

## 2016-02-21 DIAGNOSIS — M7918 Myalgia, other site: Secondary | ICD-10-CM

## 2016-02-21 DIAGNOSIS — R252 Cramp and spasm: Secondary | ICD-10-CM | POA: Insufficient documentation

## 2016-02-21 DIAGNOSIS — M791 Myalgia: Secondary | ICD-10-CM | POA: Insufficient documentation

## 2016-02-21 DIAGNOSIS — Z9049 Acquired absence of other specified parts of digestive tract: Secondary | ICD-10-CM | POA: Insufficient documentation

## 2016-02-21 DIAGNOSIS — N281 Cyst of kidney, acquired: Secondary | ICD-10-CM | POA: Insufficient documentation

## 2016-02-21 DIAGNOSIS — Z79891 Long term (current) use of opiate analgesic: Secondary | ICD-10-CM | POA: Insufficient documentation

## 2016-02-21 DIAGNOSIS — M545 Low back pain: Secondary | ICD-10-CM

## 2016-02-21 DIAGNOSIS — F1190 Opioid use, unspecified, uncomplicated: Secondary | ICD-10-CM

## 2016-02-21 DIAGNOSIS — G894 Chronic pain syndrome: Secondary | ICD-10-CM | POA: Insufficient documentation

## 2016-02-21 DIAGNOSIS — M1288 Other specific arthropathies, not elsewhere classified, other specified site: Secondary | ICD-10-CM | POA: Diagnosis not present

## 2016-02-21 DIAGNOSIS — M47896 Other spondylosis, lumbar region: Secondary | ICD-10-CM | POA: Diagnosis not present

## 2016-02-21 DIAGNOSIS — Z9889 Other specified postprocedural states: Secondary | ICD-10-CM | POA: Diagnosis not present

## 2016-02-21 DIAGNOSIS — M47816 Spondylosis without myelopathy or radiculopathy, lumbar region: Secondary | ICD-10-CM

## 2016-02-21 MED ORDER — LIDOCAINE HCL (PF) 1 % IJ SOLN
10.0000 mL | Freq: Once | INTRAMUSCULAR | Status: AC
  Administered 2016-02-21: 10 mL

## 2016-02-21 MED ORDER — METAXALONE 800 MG PO TABS: 800.0000 mg | ORAL_TABLET | Freq: Three times a day (TID) | ORAL | 0 refills | Status: DC

## 2016-02-21 MED ORDER — OXYCODONE HCL 5 MG PO TABS: 5.0000 mg | ORAL_TABLET | Freq: Four times a day (QID) | ORAL | 0 refills | Status: DC | PRN

## 2016-02-21 MED ORDER — ROPIVACAINE HCL 2 MG/ML IJ SOLN
9.0000 mL | Freq: Once | INTRAMUSCULAR | Status: AC
  Administered 2016-02-21: 9 mL via PERINEURAL
  Filled 2016-02-21: qty 10

## 2016-02-21 MED ORDER — LACTATED RINGERS IV SOLN
1000.0000 mL | Freq: Once | INTRAVENOUS | Status: AC
  Administered 2016-02-21: 1000 mL via INTRAVENOUS

## 2016-02-21 MED ORDER — FENTANYL CITRATE (PF) 100 MCG/2ML IJ SOLN
25.0000 ug | INTRAMUSCULAR | Status: DC | PRN
Start: 2016-02-21 — End: 2016-05-16
  Administered 2016-02-21: 50 ug via INTRAVENOUS
  Filled 2016-02-21: qty 2

## 2016-02-21 MED ORDER — MIDAZOLAM HCL 5 MG/5ML IJ SOLN
1.0000 mg | INTRAMUSCULAR | Status: DC | PRN
  Administered 2016-02-21: 2 mg via INTRAVENOUS
  Administered 2016-02-21: 1 mg via INTRAVENOUS
  Filled 2016-02-21: qty 5

## 2016-02-21 MED ORDER — TRIAMCINOLONE ACETONIDE 40 MG/ML IJ SUSP
40.0000 mg | Freq: Once | INTRAMUSCULAR | Status: AC
  Administered 2016-02-21: 40 mg
  Filled 2016-02-21: qty 1

## 2016-02-21 NOTE — Progress Notes (Signed)
Patient's Name: Mary Cruz  MRN: [id number]  Referring Provider: Milinda Pointer, MD  DOB: 24-Oct-1975  PCP: Silverio Decamp, MD  DOS: 02/21/2016  Note by: Kathlen Brunswick. Dossie Arbour, MD  Service setting: Ambulatory outpatient  Location: [facility] (AMB) Pain Management Facility  Visit type: Procedure  Specialty: Interventional Pain Management  Patient type: Established   Primary Reason for Visit: Interventional Pain Management Treatment. CC: Back Pain (left lower)  Procedure:  Anesthesia, Analgesia, Anxiolysis:  Type: Therapeutic Medial Branch Facet Radiofrequency Ablation Region: Lumbar Level: L2, L3, L4, L5, & S1 Medial Branch Level(s) Laterality: Left-Sided  Type: Local Anesthesia with Moderate (Conscious) Sedation Local Anesthetic: Lidocaine 1% Route: Intravenous (IV) IV Access: Secured Sedation: Meaningful verbal contact was maintained at all times during the procedure  Indication(s): Analgesia and Anxiety  Indications: 1. Lumbar facet syndrome (Bilateral) (R>L)   2. Chronic low back pain (Bilateral) (R>L)   3. Chronic pain syndrome   4. Long term prescription opiate use   5. Opiate use (30 MME/Day)   6. Lumbar spondylosis, unspecified spinal osteoarthritis   7. Muscle cramping   8. Musculoskeletal pain    Mary Cruz has either failed to respond, was unable to tolerate, or simply did not get enough benefit from other more conservative therapies including, but not limited to: 1. Over-the-counter medications 2. Anti-inflammatory medications 3. Muscle relaxants 4. Membrane stabilizers 5. Opioids 6. Physical therapy 7. Modalities (Heat, ice, etc.) 8. Invasive techniques such as nerve blocks. Mary Cruz has attained more than 50% relief of the pain from a series of diagnostic injections conducted in separate occasions.  Pain Score: Pre-procedure: 3 /10 Post-procedure: 0-No pain/10  Pre-Procedure Assessment:  Mary Cruz is a 41 y.o. (year old), female patient, seen  today for interventional treatment. She  has a past surgical history that includes spinal cord stimulator; Occipital nerve stimulator insertion; Cholecystectomy (2000); Knee surgery; Shoulder surgery; Tubal ligation (2000); Endometrial ablation; Spinal cord stimulator implant (2008); Knee surgery (Right, 02-2015); and Breast surgery.. Her primarily concern today is the Back Pain (left lower) The primary encounter diagnosis was Lumbar facet syndrome (Bilateral) (R>L). Diagnoses of Chronic low back pain (Bilateral) (R>L), Chronic pain syndrome, Long term prescription opiate use, Opiate use (30 MME/Day), Lumbar spondylosis, unspecified spinal osteoarthritis, Muscle cramping, and Musculoskeletal pain were also pertinent to this visit.  Pain Type: Chronic pain Pain Location: Back Pain Orientation: Left Pain Descriptors / Indicators: Sharp, Burning, Constant Pain Frequency: Constant  Date of Last Visit: 11/29/15 Service Provided on Last Visit: Med Refill  Coagulation Parameters Lab Results  Component Value Date   PLT 276 11/24/2015   Verification of the correct person, correct site (including marking of site), and correct procedure were performed and confirmed by the patient.  Consent: Before the procedure and under the influence of no sedative(s), amnesic(s), or anxiolytics, the patient was informed of the treatment options, risks and possible complications. To fulfill our ethical and legal obligations, as recommended by the American Medical Association's Code of Ethics, I have informed the patient of my clinical impression; the nature and purpose of the treatment or procedure; the risks, benefits, and possible complications of the intervention; the alternatives, including doing nothing; the risk(s) and benefit(s) of the alternative treatment(s) or procedure(s); and the risk(s) and benefit(s) of doing nothing. The patient was provided information about the general risks and possible complications  associated with the procedure. These may include, but are not limited to: failure to achieve desired goals, infection, bleeding, organ or nerve damage, allergic reactions,  paralysis, and death. In addition, the patient was informed of those risks and complications associated to Spine-related procedures, such as failure to decrease pain; infection (i.e.: Meningitis, epidural or intraspinal abscess); bleeding (i.e.: epidural hematoma, subarachnoid hemorrhage, or any other type of intraspinal or peri-dural bleeding); organ or nerve damage (i.e.: Any type of peripheral nerve, nerve root, or spinal cord injury) with subsequent damage to sensory, motor, and/or autonomic systems, resulting in permanent pain, numbness, and/or weakness of one or several areas of the body; allergic reactions; (i.e.: anaphylactic reaction); and/or death. Furthermore, the patient was informed of those risks and complications associated with the medications. These include, but are not limited to: allergic reactions (i.e.: anaphylactic or anaphylactoid reaction(s)); adrenal axis suppression; blood sugar elevation that in diabetics may result in ketoacidosis or comma; water retention that in patients with history of congestive heart failure may result in shortness of breath, pulmonary edema, and decompensation with resultant heart failure; weight gain; swelling or edema; medication-induced neural toxicity; particulate matter embolism and blood vessel occlusion with resultant organ, and/or nervous system infarction; and/or aseptic necrosis of one or more joints. Finally, the patient was informed that Medicine is not an exact science; therefore, there is also the possibility of unforeseen or unpredictable risks and/or possible complications that may result in a catastrophic outcome. The patient indicated having understood very clearly. We have given the patient no guarantees and we have made no promises. Enough time was given to the patient to  ask questions, all of which were answered to the patient's satisfaction. Mary Cruz has indicated that she wanted to continue with the procedure.  Consent Attestation: I, the ordering provider, attest that I have discussed with the patient the benefits, risks, side-effects, alternatives, likelihood of achieving goals, and potential problems during recovery for the procedure that I have provided informed consent.  Pre-Procedure Preparation:  Safety Precautions: Allergies reviewed. The patient was asked about blood thinners, or active infections, both of which were denied. The patient was asked to confirm the procedure and laterality, before marking the site, and again before commencing the procedure. Appropriate site, procedure, and patient were confirmed by following the Joint Commission's Universal Protocol (UP.01.01.01), in the form of a "Time Out". The patient was asked to participate by confirming the accuracy of the "Time Out" information. Patient was assessed for positional comfort and pressure points before starting the procedure. Allergies: She is allergic to amoxicillin; bactrim [sulfamethoxazole-trimethoprim]; celebrex [celecoxib]; doxycycline; erythromycin; hydromorphone hcl; latex; nabumetone; and pentazocine lactate. Allergy Precautions: Latex-free protocol activated Infection Control Precautions: Sterile technique used. Standard Universal Precautions were taken as recommended by the Department of [facility] for Disease Control and Prevention (CDC). Standard pre-surgical skin prep was conducted. Respiratory hygiene and cough etiquette was practiced. Hand hygiene observed. Safe injection practices and needle disposal techniques followed. SDV (single dose vial) medications used. Medications properly checked for expiration dates and contaminants. Personal protective equipment (PPE) used as per protocol. Monitoring:  As per clinic protocol. Vitals:   02/21/16 1344 02/21/16 1352  02/21/16 1402 02/21/16 1414  BP: 133/80 135/76 (!) 141/82 136/76  Pulse: 93 90 93 86  Resp: 13 14 16 17   Temp:  99.3 F (37.4 C)  99.5 F (37.5 C)  TempSrc:  Temporal    SpO2: 96% 96% 94% 98%  Weight:      Height:      Calculated BMI: Body mass index is 41.54 kg/m. Time-out: "Time-out" completed before starting procedure, as per protocol.  Imaging Review  Lumbosacral Imaging:  Lumbar MR wo contrast:  Results for orders placed in visit on 10/06/06  MR [place] W/O Cm   Narrative * PRIOR REPORT IMPORTED FROM AN EXTERNAL SYSTEM *   PRIOR REPORT IMPORTED FROM THE [facility] [place] EXAM:    back and leg pain  COMMENTS:   PROCEDURE:     MR  - MR LUMBAR SPINE WO CONTRAST  - Oct 06 2006  6:33PM   RESULT:     Comparison: Lumbar spine MRI on 10/21/2005.   Procedure: Multiplanar, multisequence MR examination of the lumbar spine  was  performed without gadolinium contrast.   Findings:   The lumbar spine alignment is within normal limits. The conus terminates  at  the T12-L1 level and is unremarkable. There is no significant bone marrow  abnormality to suggest an acute fracture or a suspicious osseous lesion.   Minimal degenerative facet changes are noted at L5-S1 level. The  intervertebral discs are relatively preserved at all the lumbar levels.  There is no evidence of disc herniation. There is no significant canal or  neural foraminal narrowing.   A 1.6 cm left upper pole renal cyst is noted.   IMPRESSION:   1. Minimal degenerative facet changes are noted at L5-S1 level. The  intervertebral discs are relatively preserved at all the lumbar levels.  There is no evidence of disc herniation. There is no significant canal or  neural foraminal narrowing at any of the lumbar levels.   2. 1.6 cm left upper pole renal cyst is noted.   Thank you for this opportunity to contribute to the care of your patient.       Lumbar CT wo contrast:  Results for orders  placed in visit on 07/09/12  CT Lumbar Spine Wo Contrast   Narrative * PRIOR REPORT IMPORTED FROM AN EXTERNAL SYSTEM *   PRIOR REPORT IMPORTED FROM THE [facility] WORKFLOW SYSTEM   REASON FOR EXAM:    Neck pain and cervical radiculitis Low back pain and  lumbar radiculitis Pt h...  COMMENTS:   PROCEDURE:     CT  - CT LUMBAR SPINE WO  - Jul 09 2012 12:15PM   RESULT:   Technique:  Multiplanar imaging of the lumbar spine was obtained utilizing  Helical 2 mm acquisition and bone reconstruction algorithm. This study was  compared to a prior lumbar CT myelogram dated 08/12/2008.   Findings: There is no evidence of fracture or dislocation. There is no  evidence of canal stenosis or neural foraminal narrowing. The osseous  structures are unremarkable. Limited evaluation of the surrounding soft  tissues are unremarkable.   IMPRESSION:      Unremarkable lumbar CT.   Thank you for the opportunity to contribute to the care of your patient.       Lumbar DG (Complete) 4+V:  Results for orders placed during the hospital encounter of 04/22/04  DG Lumbar Spine Complete   Narrative Clinical Data:  Fall off horse.   Pelvic and low back pain.    PELVIS - ONE VIEW:    There is no evidence of fracture or diastasis. No other significant bone or soft tissue abnormalities are identified.    IMPRESSION:    Normal study.      LUMBAR SPINE - FOUR VIEW:    There is no evidence of fracture. Alignment is normal. The intervertebral disk spaces are within normal limits and no other significant bone abnormalities are identified.     IMPRESSION:  Normal study.                      Provider: Leta Speller   Lumbar DG Myelogram views:  Results for orders placed in visit on 08/12/08  DG Myelogram Lumbar   Narrative * PRIOR REPORT IMPORTED FROM AN EXTERNAL SYSTEM *   PRIOR REPORT IMPORTED FROM THE [facility] WORKFLOW SYSTEM   REASON FOR EXAM:    low back pain please eval patency of canal especially   from l4 through sacral...  COMMENTS:   PROCEDURE:     FL  - FL MYELOGRAM LUMBAR SPINE  - Aug 12 2008  9:03AM   RESULT:     PROCEDURE:  Lumbar spine myelogram   MEDICATIONS:   2 mL 1% lidocaine, subcutaneous  12 mL Omnipaque contrast, intrathecal injection   MATERIALS:   Myelographic tray  22 gauge spinal needle   CONSENT:  Prior to the study, the patient participated in written informed  consent. The technical aspects of the procedure, as well as potential  risks  and benefits, were explained to the patient. The patient was given the  opportunity to ask questions, which were then answered. The patient wished  to proceed with the study.   Technique: The patient was placed prone on the fluoroscopy table. A formal  time a procedure was performed. Identification of the interlaminar space  was  optimized by oblique positioning under direct fluoroscopy. The puncture  site  at L4-L5 was chosen and marked. The lumbar region was cleaned and draped  in  the usual sterile manner. Local anesthesia was achieved with 1% lidocaine.  The spinal needle was placed into the thecal sac under fluoroscopic  guidance. When intrathecal placement of the needle tip was confirmed by  CSF  return, the iodinated contrast was instilled under direct fluoroscopic  control. Intra-thecal contrast at the appropriate level was confirmed with  fluoroscopy. The spinal needle was then withdrawn and a bandage placed  over  the puncture site. Hemostasis was achieved. The patient left the procedure  room in stable condition, with appropriate orders on the chart.   Subsequently, the patient was placed on the CT scanner and images were  obtained from T12 to L1 at 27mm collimation. Sagittal and coronal  reformatted  images were then generated from the axial images.   COMPLICATIONS:  None.   IMPRESSION:   Successful lumbar spine myelogram prior to CT scan of the lumbar spine.       Description of Procedure  Process:   Time-out: "Time-out" completed before starting procedure, as per protocol. Position: Prone Target Area: For Lumbar Facet blocks, the target is the groove formed by the junction of the transverse process and superior articular process. For the L5 dorsal ramus, the target is the notch between superior articular process and sacral ala. For the S1 dorsal ramus, the target is the superior and lateral edge of the posterior S1 Sacral foramen. Approach: Paraspinal approach. Area Prepped: Entire Posterior Lumbosacral Region Prepping solution: ChloraPrep (2% chlorhexidine gluconate and 70% isopropyl alcohol) Safety Precautions: Aspiration looking for blood return was conducted prior to all injections. At no point did we inject any substances, as a needle was being advanced. No attempts were made at seeking any paresthesias. Safe injection practices and needle disposal techniques used. Medications properly checked for expiration dates. SDV (single dose vial) medications used. Description of the Procedure: Protocol guidelines were followed. The patient was placed in position over the fluoroscopy table. The  target area was identified and the area prepped in the usual manner. Skin desensitized using vapocoolant spray. Skin & deeper tissues infiltrated with local anesthetic. Appropriate amount of time allowed to pass for local anesthetics to take effect. Radiofrequency needles were introduced to the area of the medial branch at the junction of the superior articular process and transverse process using fluoroscopy. Using the [employer], sensory stimulation using 50 Hz was used to locate & identify the nerve, making sure that the needle was positioned such that there was no sensory stimulation below 0.3 V or above 0.7 V. Stimulation using 2 Hz was used to evaluate the motor component. Care was taken not to lesion any nerves that demonstrated motor stimulation of the lower extremities at an  output of less than 2.5 times that of the sensory threshold, or a maximum of 2.0 V. Once satisfactory placement of the needles was achieved, the above solution was slowly injected after negative aspiration. After waiting for at least 2 minutes, the ablation was performed at 80 degrees C for 60 seconds.The needles were then removed and the area cleansed, making sure to leave some of the prepping solution back to take advantage of its long term bactericidal properties. EBL: None Materials & Medications:  Needle(s) Type: Teflon-coated, curved tip, Radiofrequency needle(s) Gauge: 20G Length: 15cm Medication(s): We administered triamcinolone acetonide, lidocaine (PF), ropivacaine (PF) 2 mg/mL (0.2%), fentaNYL, lactated ringers, and midazolam. Please see chart orders for dosing details.  Imaging Guidance (Spinal):  Type of Imaging Technique: Fluoroscopy Guidance (Spinal) Indication(s): Assistance in needle guidance and placement for procedures requiring needle placement in or near specific anatomical locations not easily accessible without such assistance. Exposure Time: Please see nurses notes. Contrast: None used. Fluoroscopic Guidance: I was personally present during the use of fluoroscopy. "Tunnel Vision Technique" used to obtain the best possible view of the target area. Parallax error corrected before commencing the procedure. "Direction-depth-direction" technique used to introduce the needle under continuous pulsed fluoroscopy. Once target was reached, antero-posterior, oblique, and lateral fluoroscopic projection used confirm needle placement in all planes. Images permanently stored in EMR. Interpretation: No contrast injected. I personally interpreted the imaging intraoperatively. Adequate needle placement confirmed in multiple planes. Permanent images saved into the patient's record.  Antibiotic Prophylaxis:  Indication(s): No indications identified. Type:  Antibiotics Given (last 72 hours)     None      Post-operative Assessment:  Complications: No immediate post-treatment complications observed by team, or reported by patient. Disposition: The patient tolerated the entire procedure well. A repeat set of vitals were taken after the procedure and the patient was kept under observation following institutional policy, for this type of procedure. Post-procedural neurological assessment was performed, showing return to baseline, prior to discharge. The patient was provided with post-procedure discharge instructions, including a section on how to identify potential problems. Should any problems arise concerning this procedure, the patient was given instructions to immediately contact us, at any time, without hesitation. In any case, we plan to contact the patient by telephone for a follow-up status report regarding this interventional procedure. Comments:  No additional relevant information.  Plan of Care  Discharge to: Discharge home  Medications ordered for procedure: Meds ordered this encounter  Medications  . oxyCODONE (OXY IR/ROXICODONE) 5 MG immediate release tablet    Sig: Take 1 tablet (5 mg total) by mouth every 6 (six) hours as needed for severe pain.    Dispense:  120 tablet    Refill:  0  Do not add this medication to the electronic "Automatic Refill" notification system. Patient may have prescription filled one day early if pharmacy is closed on scheduled refill date. Do not fill until: 03/30/16 To last until: 04/29/16  . oxyCODONE (OXY IR/ROXICODONE) 5 MG immediate release tablet    Sig: Take 1 tablet (5 mg total) by mouth every 6 (six) hours as needed for severe pain.    Dispense:  120 tablet    Refill:  0    Patient may have prescription filled one day early if pharmacy is closed on scheduled refill date. Do not fill until: 04/29/16 To last until: 05/29/16  . oxyCODONE (OXY IR/ROXICODONE) 5 MG immediate release tablet    Sig: Take 1 tablet (5 mg total) by mouth  every 6 (six) hours as needed for severe pain.    Dispense:  120 tablet    Refill:  0    Do not add this medication to the electronic "Automatic Refill" notification system. Patient may have prescription filled one day early if pharmacy is closed on scheduled refill date. Do not fill until: 02/29/16 To last until: 03/30/16  . triamcinolone acetonide (KENALOG-40) injection 40 mg  . lidocaine (PF) (XYLOCAINE) 1 % injection 10 mL  . ropivacaine (PF) 2 mg/mL (0.2%) (NAROPIN) injection 9 mL  . fentaNYL (SUBLIMAZE) injection 25-50 mcg    Make sure Narcan is available in the pyxis when using this medication. In the event of respiratory depression (RR< 8/min): Titrate NARCAN (naloxone) in increments of 0.1 to 0.2 mg IV at 2-3 minute intervals, until desired degree of reversal.  . lactated ringers infusion 1,000 mL  . midazolam (VERSED) 5 MG/5ML injection 1-2 mg    Make sure Flumazenil is available in the pyxis when using this medication. If oversedation occurs, administer 0.2 mg IV over 15 sec. If after 45 sec no response, administer 0.2 mg again over 1 min; may repeat at 1 min intervals; not to exceed 4 doses (1 mg)  . metaxalone (SKELAXIN) 800 MG tablet    Sig: Take 1 tablet (800 mg total) by mouth 3 (three) times daily.    Dispense:  270 tablet    Refill:  0    Do not place this medication, or any other prescription from our practice, on "Automatic Refill". Patient may have prescription filled one day early if pharmacy is closed on scheduled refill date.   Medications administered: (For more details, see medical record) We administered triamcinolone acetonide, lidocaine (PF), ropivacaine (PF) 2 mg/mL (0.2%), fentaNYL, lactated ringers, and midazolam. Lab-work, Procedure(s), & Referral(s) Ordered: Orders Placed This Encounter  Procedures  . DG C-Arm 1-60 Min-No Report  . Discharge instructions  . Follow-up  . Informed Consent Details: Transcribe to consent form and obtain patient signature  .  Provider attestation of informed consent for procedure/surgical case  . Verify informed consent   Imaging Ordered: No results found for this or any previous visit. New Prescriptions   No medications on file   Physician-requested Follow-up:  Return in about 6 weeks (around 04/03/2016) for Post-Procedure evaluation, in addition medication management before 05/29/2016.  Future Appointments Date Time Provider [place]  04/01/2016 1:15 PM Milinda Pointer, MD [facility] None   Primary Care Physician: Silverio Decamp, MD Location: [facility] Note by: Kathlen Brunswick. Dossie Arbour, M.D, DABA, DABAPM, DABPM, DABIPP, FIPP Date: 02/21/16; Time: 5:18 PM  Disclaimer:  Medicine is not an Chief Strategy Officer. The only guarantee in medicine is that nothing is guaranteed. It  is important to note that the decision to proceed with this intervention was based on the information collected from the patient. The Data and conclusions were drawn from the patient's questionnaire, the interview, and the physical examination. Because the information was provided in large part by the patient, it cannot be guaranteed that it has not been purposely or unconsciously manipulated. Every effort has been made to obtain as much relevant data as possible for this evaluation. It is important to note that the conclusions that lead to this procedure are derived in large part from the available data. Always take into account that the treatment will also be dependent on availability of resources and existing treatment guidelines, considered by other Pain Management Practitioners as being common knowledge and practice, at the time of the intervention. For Medico-Legal purposes, it is also important to point out that variation in procedural techniques and pharmacological choices are the acceptable norm. The indications, contraindications, technique, and results of the above procedure should only be interpreted  and judged by a Board-Certified Interventional Pain Specialist with extensive familiarity and expertise in the same exact procedure and technique. Attempts at providing opinions without similar or greater experience and expertise than that of the treating physician will be considered as inappropriate and unethical, and shall result in a formal complaint to the state medical board and applicable specialty societies.  Instructions provided at this appointment: Patient Instructions  Radiofrequency Lesioning Introduction Radiofrequency lesioning is a procedure that is performed to relieve pain. The procedure is often used for back, neck, or arm pain. Radiofrequency lesioning involves the use of a machine that creates radio waves to make heat. During the procedure, the heat is applied to the nerve that carries the pain signal. The heat damages the nerve and interferes with the pain signal. Pain relief usually starts about 2 weeks after the procedure and lasts for 6 months to 1 year. Tell a health care provider about:  Any allergies you have.  All medicines you are taking, including vitamins, herbs, eye drops, creams, and over-the-counter medicines.  Any problems you or family members have had with anesthetic medicines.  Any blood disorders you have.  Any surgeries you have had.  Any medical conditions you have.  Whether you are pregnant or may be pregnant. What are the risks? Generally, this is a safe procedure. However, problems may occur, including:  Pain or soreness at the injection site.  Infection at the injection site.  Damage to nerves or blood vessels. What happens before the procedure?  Ask your health care provider about:  Changing or stopping your regular medicines. This is especially important if you are taking diabetes medicines or blood thinners.  Taking medicines such as aspirin and ibuprofen. These medicines can thin your blood. Do not take these medicines before your  procedure if your health care provider instructs you not to.  Follow instructions from your health care provider about eating or drinking restrictions.  Plan to have someone take you home after the procedure.  If you go home right after the procedure, plan to have someone with you for 24 hours. What happens during the procedure?  You will be given one or more of the following:  A medicine to help you relax (sedative).  A medicine to numb the area (local anesthetic).  You will be awake during the procedure. You will need to be able to talk with the health care provider during the procedure.  With the help of a type of X-ray (fluoroscopy),  the health care provider will insert a radiofrequency needle into the area to be treated.  Next, a wire that carries the radio waves (electrode) will be put through the radiofrequency needle. An electrical pulse will be sent through the electrode to verify the correct nerve. You will feel a tingling sensation, and you may have muscle twitching.  Then, the tissue that is around the needle tip will be heated by an electric current that is passed using the radiofrequency machine. This will numb the nerves.  A bandage (dressing) will be put on the insertion area after the procedure is done. The procedure may vary among health care providers and hospitals. What happens after the procedure?  Your blood pressure, heart rate, breathing rate, and blood oxygen level will be monitored often until the medicines you were given have worn off.  Return to your normal activities as directed by your health care provider. This information is not intended to replace advice given to you by your health care provider. Make sure you discuss any questions you have with your health care provider. Document Released: 09/26/2010 Document Revised: 07/06/2015 Document Reviewed: 03/07/2014  2017 [employer] Discharge Instructions  General Discharge Instructions  :  If you need to reach your doctor call: Monday-Friday 8:00 am - 4:00 pm at [phone] or toll free [phone].  After clinic hours [phone] to have operator reach doctor.  Bring all of your medication bottles to all your appointments in the pain clinic.  To cancel or reschedule your appointment with Pain Management please remember to call 24 hours in advance to avoid a fee.  Refer to the educational materials which you have been given on: General Risks, I had my Procedure. Discharge Instructions, Post Sedation.  Post Procedure Instructions:  The drugs you were given will stay in your system until tomorrow, so for the next 24 hours you should not drive, make any legal decisions or drink any alcoholic beverages.  You may eat anything you prefer, but it is better to start with liquids then soups and crackers, and gradually work up to solid foods.  Please notify your doctor immediately if you have any unusual bleeding, trouble breathing or pain that is not related to your normal pain.  Depending on the type of procedure that was done, some parts of your body may feel week and/or numb.  This usually clears up by tonight or the next day.  Walk with the use of an assistive device or accompanied by an adult for the 24 hours.  You may use ice on the affected area for the first 24 hours.  Put ice in a Ziploc bag and cover with a towel and place against area 15 minutes on 15 minutes off.  You may switch to heat after 24 hours.

## 2016-02-21 NOTE — Progress Notes (Signed)
Safety precautions to be maintained throughout the outpatient stay will include: orient to surroundings, keep bed in low position, maintain call bell within reach at all times, provide assistance with transfer out of bed and ambulation.  

## 2016-02-21 NOTE — Patient Instructions (Signed)
Radiofrequency Lesioning Introduction Radiofrequency lesioning is a procedure that is performed to relieve pain. The procedure is often used for back, neck, or arm pain. Radiofrequency lesioning involves the use of a machine that creates radio waves to make heat. During the procedure, the heat is applied to the nerve that carries the pain signal. The heat damages the nerve and interferes with the pain signal. Pain relief usually starts about 2 weeks after the procedure and lasts for 6 months to 1 year. Tell a health care provider about:  Any allergies you have.  All medicines you are taking, including vitamins, herbs, eye drops, creams, and over-the-counter medicines.  Any problems you or family members have had with anesthetic medicines.  Any blood disorders you have.  Any surgeries you have had.  Any medical conditions you have.  Whether you are pregnant or may be pregnant. What are the risks? Generally, this is a safe procedure. However, problems may occur, including:  Pain or soreness at the injection site.  Infection at the injection site.  Damage to nerves or blood vessels. What happens before the procedure?  Ask your health care provider about:  Changing or stopping your regular medicines. This is especially important if you are taking diabetes medicines or blood thinners.  Taking medicines such as aspirin and ibuprofen. These medicines can thin your blood. Do not take these medicines before your procedure if your health care provider instructs you not to.  Follow instructions from your health care provider about eating or drinking restrictions.  Plan to have someone take you home after the procedure.  If you go home right after the procedure, plan to have someone with you for 24 hours. What happens during the procedure?  You will be given one or more of the following:  A medicine to help you relax (sedative).  A medicine to numb the area (local anesthetic).  You  will be awake during the procedure. You will need to be able to talk with the health care provider during the procedure.  With the help of a type of X-ray (fluoroscopy), the health care provider will insert a radiofrequency needle into the area to be treated.  Next, a wire that carries the radio waves (electrode) will be put through the radiofrequency needle. An electrical pulse will be sent through the electrode to verify the correct nerve. You will feel a tingling sensation, and you may have muscle twitching.  Then, the tissue that is around the needle tip will be heated by an electric current that is passed using the radiofrequency machine. This will numb the nerves.  A bandage (dressing) will be put on the insertion area after the procedure is done. The procedure may vary among health care providers and hospitals. What happens after the procedure?  Your blood pressure, heart rate, breathing rate, and blood oxygen level will be monitored often until the medicines you were given have worn off.  Return to your normal activities as directed by your health care provider. This information is not intended to replace advice given to you by your health care provider. Make sure you discuss any questions you have with your health care provider. Document Released: 09/26/2010 Document Revised: 07/06/2015 Document Reviewed: 03/07/2014  2017 [employer] Pain [employer] Discharge Instructions  General Discharge Instructions :  If you need to reach your doctor call: Monday-Friday 8:00 am - 4:00 pm at [phone] or toll free [phone].  After clinic hours [phone] to have operator reach doctor.  Bring all of your  medication bottles to all your appointments in the pain clinic.  To cancel or reschedule your appointment with Pain Management please remember to call 24 hours in advance to avoid a fee.  Refer to the educational materials which you have been given on: General Risks, I had my  Procedure. Discharge Instructions, Post Sedation.  Post Procedure Instructions:  The drugs you were given will stay in your system until tomorrow, so for the next 24 hours you should not drive, make any legal decisions or drink any alcoholic beverages.  You may eat anything you prefer, but it is better to start with liquids then soups and crackers, and gradually work up to solid foods.  Please notify your doctor immediately if you have any unusual bleeding, trouble breathing or pain that is not related to your normal pain.  Depending on the type of procedure that was done, some parts of your body may feel week and/or numb.  This usually clears up by tonight or the next day.  Walk with the use of an assistive device or accompanied by an adult for the 24 hours.  You may use ice on the affected area for the first 24 hours.  Put ice in a Ziploc bag and cover with a towel and place against area 15 minutes on 15 minutes off.  You may switch to heat after 24 hours.

## 2016-02-22 ENCOUNTER — Telehealth: Payer: Self-pay

## 2016-02-22 NOTE — Telephone Encounter (Signed)
Post procedure phone call.   No answer.  

## 2016-02-27 ENCOUNTER — Ambulatory Visit: Payer: BLUE CROSS/BLUE SHIELD | Admitting: Pain Medicine

## 2016-03-04 ENCOUNTER — Emergency Department
Admission: EM | Admit: 2016-03-04 | Discharge: 2016-03-04 | Disposition: A | Discharge status: Home / Self Care | Payer: BLUE CROSS/BLUE SHIELD | Source: Home / Self Care

## 2016-03-04 ENCOUNTER — Encounter: Payer: Self-pay | Admitting: Emergency Medicine

## 2016-03-04 DIAGNOSIS — J209 Acute bronchitis, unspecified: Secondary | ICD-10-CM | POA: Diagnosis not present

## 2016-03-04 MED ORDER — CEPHALEXIN 500 MG PO CAPS: 500.0000 mg | ORAL_CAPSULE | Freq: Four times a day (QID) | ORAL | 0 refills | Status: DC

## 2016-03-04 MED ORDER — ALBUTEROL SULFATE HFA 108 (90 BASE) MCG/ACT IN AERS: 1.0000 | INHALATION_SPRAY | Freq: Four times a day (QID) | RESPIRATORY_TRACT | 0 refills | Status: DC | PRN

## 2016-03-04 NOTE — ED Triage Notes (Signed)
Dry cough, congestion, fever x 1 week

## 2016-03-04 NOTE — ED Provider Notes (Signed)
CSN: [id number]     Arrival date & time 03/04/16  1426 History   None    Chief Complaint  Patient presents with  . Cough   (Consider location/radiation/quality/duration/timing/severity/associated sxs/prior Treatment) The history is provided by the patient. No language interpreter was used.  Cough  Cough characteristics:  Productive Sputum characteristics:  Nondescript Severity:  Moderate Onset quality:  Gradual Duration:  1 week Timing:  Constant Progression:  Worsening Chronicity:  New Smoker: yes   Context: not sick contacts   Relieved by:  Nothing Worsened by:  Nothing Associated symptoms: no fever and no shortness of breath   Risk factors: no recent infection   Pt reports bad cough for over a week.  Pt has had to use an inhaler in the past    Past Medical History:  Diagnosis Date  . Abscess of axillary fold 10/10/2013  . Acute bronchitis with asthma with acute exacerbation 01/23/2015  . Fibromyalgia   . Hypertension   . Lupus   . Osteoarthritis   . Pain    chronic regional pain syndrome  . Sepsis affecting skin ([place]) 10/10/2013   Past Surgical History:  Procedure Laterality Date  . BREAST SURGERY    . CHOLECYSTECTOMY  2000  . ENDOMETRIAL ABLATION    . KNEE SURGERY    . KNEE SURGERY Right 02-2015  . OCCIPITAL NERVE STIMULATOR INSERTION    . SHOULDER SURGERY    . spinal cord stimulator    . SPINAL CORD STIMULATOR IMPLANT  2008  . TUBAL LIGATION  2000   Family History  Problem Relation Age of Onset  . Depression Mother     maternal grandmother  . Asthma Mother   . Fibromyalgia      aunt  . Stroke      grandmother  . Thyroid disease      grandmother   Social History  Substance Use Topics  . Smoking status: Former Smoker    Quit date: 08/01/2015  . Smokeless tobacco: Never Used  . Alcohol use No   OB History    No data available     Review of Systems  Constitutional: Negative for fever.  Respiratory: Positive for cough. Negative for shortness of  breath.   All other systems reviewed and are negative.   Allergies  Amoxicillin; Bactrim [sulfamethoxazole-trimethoprim]; Celebrex [celecoxib]; Doxycycline; Erythromycin; Hydromorphone hcl; Latex; Nabumetone; and Pentazocine lactate  Home Medications   Prior to Admission medications   Medication Sig Start Date End Date Taking? Authorizing Provider  albuterol (PROVENTIL HFA;VENTOLIN HFA) 108 (90 Base) MCG/ACT inhaler Inhale 1-2 puffs into the lungs every 6 (six) hours as needed for wheezing or shortness of breath. 03/04/16   Fransico Meadow, PA-C  amitriptyline (ELAVIL) 100 MG tablet TAKE 1 TABLET (100 MG TOTAL) BY MOUTH AT BEDTIME. 09/11/15   Sean Hommel, DO  cephALEXin (KEFLEX) 500 MG capsule Take 1 capsule (500 mg total) by mouth 4 (four) times daily. 03/04/16   Fransico Meadow, PA-C  Cholecalciferol (VITAMIN D3) 2000 units TABS Take 1 tablet by mouth daily.    Historical Provider, MD  DULoxetine (CYMBALTA) 60 MG capsule Take 1 capsule (60 mg total) by mouth 2 (two) times daily. 09/11/15   Marcial Pacas, DO  hydroxychloroquine (PLAQUENIL) 200 MG tablet 2 (two) times daily.  11/28/15   Historical Provider, MD  lisinopril-hydrochlorothiazide (PRINZIDE,ZESTORETIC) 10-12.5 MG tablet Take 0.5 tablets by mouth daily. 09/12/15   Marcial Pacas, DO  metaxalone (SKELAXIN) 800 MG tablet Take 1 tablet (800 mg  total) by mouth 3 (three) times daily. 02/29/16 05/29/16  Milinda Pointer, MD  metFORMIN (GLUCOPHAGE) 500 MG tablet Take by mouth 2 (two) times daily with a meal.    Historical Provider, MD  omeprazole (PRILOSEC) 40 MG capsule Take 1 capsule (40 mg total) by mouth 2 (two) times daily. 09/14/15   Marcial Pacas, DO  oxyCODONE (OXY IR/ROXICODONE) 5 MG immediate release tablet Take 1 tablet (5 mg total) by mouth every 6 (six) hours as needed for severe pain. 03/30/16 04/29/16  Milinda Pointer, MD  oxyCODONE (OXY IR/ROXICODONE) 5 MG immediate release tablet Take 1 tablet (5 mg total) by mouth every 6 (six) hours as  needed for severe pain. 04/29/16 05/29/16  Milinda Pointer, MD  oxyCODONE (OXY IR/ROXICODONE) 5 MG immediate release tablet Take 1 tablet (5 mg total) by mouth every 6 (six) hours as needed for severe pain. 02/29/16 03/30/16  Milinda Pointer, MD  pregabalin (LYRICA) 75 MG capsule Take 75 mg by mouth 2 (two) times daily.    Historical Provider, MD   Meds Ordered and Administered this Visit  Medications - No data to display  BP 110/78 (BP Location: Left Arm)   Pulse 92   Temp 98 F (36.7 C) (Oral)   Ht 5\' 4"  (1.626 m)   Wt 251 lb (113.9 kg)   SpO2 94%   BMI 43.08 kg/m  No data found.   Physical Exam  Constitutional: She appears well-developed and well-nourished.  HENT:  Head: Normocephalic.  Right Ear: External ear normal.  Left Ear: External ear normal.  Mouth/Throat: Oropharynx is clear and moist.  Eyes: Pupils are equal, round, and reactive to light.  Neck: Normal range of motion.  Cardiovascular: Normal rate.   Pulmonary/Chest: Effort normal.  Abdominal: Soft.  Musculoskeletal: Normal range of motion.  Neurological: She is alert.  Skin: Skin is warm.  Psychiatric: She has a normal mood and affect.  Nursing note and vitals reviewed.   Urgent Care Course     Procedures (including critical care time)  Labs Review Labs Reviewed - No data to display  Imaging Review No results found.   Visual Acuity Review  Right Eye Distance:   Left Eye Distance:   Bilateral Distance:    Right Eye Near:   Left Eye Near:    Bilateral Near:         MDM  I doubt influenza.  No bodyache,  Pt recently stop smoking.  I do not suspect pneumonia.  I will cover for bronchitis    1. Acute bronchitis, unspecified organism    Meds ordered this encounter  Medications  . cephALEXin (KEFLEX) 500 MG capsule    Sig: Take 1 capsule (500 mg total) by mouth 4 (four) times daily.    Dispense:  40 capsule    Refill:  0    Order Specific Question:   Supervising Provider    Answer:    Burnett Harry, DAVID [[id number]]  . albuterol (PROVENTIL HFA;VENTOLIN HFA) 108 (90 Base) MCG/ACT inhaler    Sig: Inhale 1-2 puffs into the lungs every 6 (six) hours as needed for wheezing or shortness of breath.    Dispense:  1 Inhaler    Refill:  0    Order Specific Question:   Supervising Provider    Answer:   Burnett Harry, DAVID McDonough, PA-C 03/04/16 1559

## 2016-03-04 NOTE — Discharge Instructions (Signed)
Return if any problems.

## 2016-03-13 ENCOUNTER — Other Ambulatory Visit: Payer: Self-pay

## 2016-03-13 MED ORDER — OMEPRAZOLE 40 MG PO CPDR: 40.0000 mg | DELAYED_RELEASE_CAPSULE | Freq: Two times a day (BID) | ORAL | 1 refills | Status: DC

## 2016-03-27 ENCOUNTER — Other Ambulatory Visit: Payer: Self-pay | Admitting: Sports Medicine

## 2016-03-27 MED ORDER — AMITRIPTYLINE HCL 100 MG PO TABS: ORAL_TABLET | ORAL | 1 refills | Status: DC

## 2016-04-01 ENCOUNTER — Ambulatory Visit: Payer: BLUE CROSS/BLUE SHIELD | Admitting: Pain Medicine

## 2016-04-02 ENCOUNTER — Other Ambulatory Visit: Payer: Self-pay | Admitting: Sports Medicine

## 2016-04-02 MED ORDER — LISINOPRIL-HYDROCHLOROTHIAZIDE 10-12.5 MG PO TABS: 0.5000 | ORAL_TABLET | Freq: Every day | ORAL | 1 refills | Status: DC

## 2016-05-05 ENCOUNTER — Emergency Department
Admission: EM | Admit: 2016-05-05 | Discharge: 2016-05-05 | Disposition: A | Discharge status: Home / Self Care | Payer: BLUE CROSS/BLUE SHIELD | Source: Home / Self Care | Attending: Family Medicine | Admitting: Family Medicine

## 2016-05-05 DIAGNOSIS — J209 Acute bronchitis, unspecified: Secondary | ICD-10-CM

## 2016-05-05 DIAGNOSIS — J040 Acute laryngitis: Secondary | ICD-10-CM

## 2016-05-05 DIAGNOSIS — J4521 Mild intermittent asthma with (acute) exacerbation: Secondary | ICD-10-CM

## 2016-05-05 MED ORDER — AZITHROMYCIN 250 MG PO TABS: 250.0000 mg | ORAL_TABLET | Freq: Every day | ORAL | 0 refills | Status: DC

## 2016-05-05 NOTE — ED Provider Notes (Signed)
CSN: [id number]     Arrival date & time 05/05/16  1408 History   First MD Initiated Contact with Patient 05/05/16 1440     Chief Complaint  Patient presents with  . Cough   (Consider location/radiation/quality/duration/timing/severity/associated sxs/prior Treatment) HPI  Mary Cruz is a 41 y.o. female presenting to UC with c/o 1 week of worsening productive cough with congestion, chest tightness and hoarse voice that started this morning. She has tried OTC Tylenol Severe Cough with little relief.  She notes she has had back and rib soreness from her cough.  Hx of bronchitis in the past, she has an inhaler at home she has been using.  She also reports hx of lupus so she has "plenty" of prednisone to use as needed. She has not tried any prednisone yet.  She is accompanied by her 59yo daughter who also developed a cough 1 week ago and her 72yo daughter who developed a cough 2 days ago.  Pt is a current cigarette smoker, trying to quit.    Past Medical History:  Diagnosis Date  . Abscess of axillary fold 10/10/2013  . Acute bronchitis with asthma with acute exacerbation 01/23/2015  . Fibromyalgia   . Hypertension   . Lupus   . Osteoarthritis   . Pain    chronic regional pain syndrome  . Sepsis affecting skin ([place]) 10/10/2013   Past Surgical History:  Procedure Laterality Date  . BREAST SURGERY    . CHOLECYSTECTOMY  2000  . ENDOMETRIAL ABLATION    . KNEE SURGERY    . KNEE SURGERY Right 02-2015  . OCCIPITAL NERVE STIMULATOR INSERTION    . SHOULDER SURGERY    . spinal cord stimulator    . SPINAL CORD STIMULATOR IMPLANT  2008  . TUBAL LIGATION  2000   Family History  Problem Relation Age of Onset  . Depression Mother     maternal grandmother  . Asthma Mother   . Fibromyalgia      aunt  . Stroke      grandmother  . Thyroid disease      grandmother   Social History  Substance Use Topics  . Smoking status: Former Smoker    Quit date: 08/01/2015  . Smokeless tobacco: Never  Used  . Alcohol use No   OB History    No data available     Review of Systems  Constitutional: Negative for chills and fever.  HENT: Positive for congestion, postnasal drip, rhinorrhea and voice change. Negative for ear pain, sore throat and trouble swallowing.   Respiratory: Positive for cough, chest tightness and wheezing. Negative for shortness of breath.   Cardiovascular: Negative for chest pain and palpitations.  Gastrointestinal: Negative for abdominal pain, diarrhea, nausea and vomiting.  Musculoskeletal: Negative for arthralgias, back pain and myalgias.  Skin: Negative for rash.    Allergies  Amoxicillin; Bactrim [sulfamethoxazole-trimethoprim]; Celebrex [celecoxib]; Doxycycline; Erythromycin; Hydromorphone hcl; Latex; Nabumetone; and Pentazocine lactate  Home Medications   Prior to Admission medications   Medication Sig Start Date End Date Taking? Authorizing Provider  albuterol (PROVENTIL HFA;VENTOLIN HFA) 108 (90 Base) MCG/ACT inhaler Inhale 1-2 puffs into the lungs every 6 (six) hours as needed for wheezing or shortness of breath. 03/04/16  Yes Hollace Kinnier Sofia, PA-C  amitriptyline (ELAVIL) 100 MG tablet TAKE 1 TABLET (100 MG TOTAL) BY MOUTH AT BEDTIME. 03/27/16  Yes Silverio Decamp, MD  Cholecalciferol (VITAMIN D3) 2000 units TABS Take 1 tablet by mouth daily.   Yes Historical Provider,  MD  DULoxetine (CYMBALTA) 60 MG capsule Take 1 capsule (60 mg total) by mouth 2 (two) times daily. 09/11/15  Yes Sean Hommel, DO  hydroxychloroquine (PLAQUENIL) 200 MG tablet 2 (two) times daily.  11/28/15  Yes Historical Provider, MD  lisinopril-hydrochlorothiazide (PRINZIDE,ZESTORETIC) 10-12.5 MG tablet Take 0.5 tablets by mouth daily. 04/02/16  Yes Silverio Decamp, MD  metaxalone (SKELAXIN) 800 MG tablet Take 1 tablet (800 mg total) by mouth 3 (three) times daily. 02/29/16 05/29/16 Yes Milinda Pointer, MD  metFORMIN (GLUCOPHAGE) 500 MG tablet Take by mouth 2 (two) times daily with  a meal.   Yes Historical Provider, MD  omeprazole (PRILOSEC) 40 MG capsule Take 1 capsule (40 mg total) by mouth 2 (two) times daily. 03/13/16  Yes Silverio Decamp, MD  oxyCODONE (OXY IR/ROXICODONE) 5 MG immediate release tablet Take 1 tablet (5 mg total) by mouth every 6 (six) hours as needed for severe pain. 04/29/16 05/29/16 Yes Milinda Pointer, MD  pregabalin (LYRICA) 75 MG capsule Take 75 mg by mouth 2 (two) times daily.   Yes Historical Provider, MD  azithromycin (ZITHROMAX) 250 MG tablet Take 1 tablet (250 mg total) by mouth daily. Take first 2 tablets together, then 1 every day until finished. 05/05/16   Noland Fordyce, PA-C  cephALEXin (KEFLEX) 500 MG capsule Take 1 capsule (500 mg total) by mouth 4 (four) times daily. 03/04/16   Fransico Meadow, PA-C  oxyCODONE (OXY IR/ROXICODONE) 5 MG immediate release tablet Take 1 tablet (5 mg total) by mouth every 6 (six) hours as needed for severe pain. 03/30/16 04/29/16  Milinda Pointer, MD  oxyCODONE (OXY IR/ROXICODONE) 5 MG immediate release tablet Take 1 tablet (5 mg total) by mouth every 6 (six) hours as needed for severe pain. 02/29/16 03/30/16  Milinda Pointer, MD   Meds Ordered and Administered this Visit  Medications - No data to display  BP 127/83 (BP Location: Left Arm)   Pulse 94   Temp 97.7 F (36.5 C) (Oral)   Ht 5\' 4"  (1.626 m)   Wt 261 lb (118.4 kg)   SpO2 96%   BMI 44.80 kg/m  No data found.   Physical Exam  Constitutional: She is oriented to person, place, and time. She appears well-developed and well-nourished. No distress.  HENT:  Head: Normocephalic and atraumatic.  Right Ear: Tympanic membrane normal.  Left Ear: Tympanic membrane normal.  Nose: Nose normal.  Mouth/Throat: Uvula is midline, oropharynx is clear and moist and mucous membranes are normal.  Eyes: EOM are normal.  Neck: Normal range of motion. Neck supple.  Hoarse voice but no stridor.  Cardiovascular: Normal rate and regular rhythm.    Pulmonary/Chest: Effort normal. No stridor. No respiratory distress. She has wheezes. She has rhonchi. She has rales. She exhibits no tenderness.  Diffuse coarse breath sounds with wheeze. No respiratory distress. Intermittent dry cough on exam.   Musculoskeletal: Normal range of motion.  Lymphadenopathy:    She has no cervical adenopathy.  Neurological: She is alert and oriented to person, place, and time.  Skin: Skin is warm and dry. She is not diaphoretic.  Psychiatric: She has a normal mood and affect. Her behavior is normal.  Nursing note and vitals reviewed.   Urgent Care Course     Procedures (including critical care time)  Labs Review Labs Reviewed - No data to display  Imaging Review No results found.    MDM   1. Acute bronchitis, unspecified organism   2. Mild intermittent asthma with exacerbation  3. Laryngitis    Hx and exam c/w acute bronchitis and laryngitis   Pt is afebrile Encouraged symptomatic treatment with her inhaler and prednisone. Pt declined refill of either. Instructed to take 60mg  Day 1 and 40mg  Day 2-5 for 5 days of prednisone to help with cough/wheeze.  Prescription to hold with expiration date for azithromycin. Pt to fill if persistent fever develops or not improving in 1 week.      Noland Fordyce, PA-C 05/05/16 1520

## 2016-05-05 NOTE — Discharge Instructions (Signed)
°  You may take 60mg  Prednisone Day 1 and 40mg  Prednisone for Days 2-5 (for a total of 5 days) to help with inflammation in your chest. The prednisone will help with cough, wheeze, chest tightness, and hoarse voice.  Your symptoms are likely due to a virus such as the common cold, however, if you developing worsening chest congestion with shortness of breath, persistent fever for 3 days, or symptoms not improving in 4-5 days, you may fill the antibiotic (azithromycin).  If you do fill the antibiotic,  please take antibiotics as prescribed and be sure to complete entire course even if you start to feel better to ensure infection does not come back.

## 2016-05-05 NOTE — ED Triage Notes (Signed)
Patient states that she has had cough, congestion, sore throat for approx one week. States she last her voice this morning. She tried OTC Tylenol severe cough with little relief. States her back and ribs are sore from coughing.

## 2016-05-16 ENCOUNTER — Encounter: Payer: Self-pay | Admitting: Pain Medicine

## 2016-05-16 ENCOUNTER — Ambulatory Visit: Payer: BLUE CROSS/BLUE SHIELD | Attending: Pain Medicine | Admitting: Pain Medicine

## 2016-05-16 ENCOUNTER — Telehealth: Payer: Self-pay

## 2016-05-16 VITALS — BP 125/78 | HR 106 | Temp 98.2°F | Resp 16 | Ht 64.0 in | Wt 255.0 lb

## 2016-05-16 DIAGNOSIS — Z87891 Personal history of nicotine dependence: Secondary | ICD-10-CM | POA: Insufficient documentation

## 2016-05-16 DIAGNOSIS — Z9689 Presence of other specified functional implants: Secondary | ICD-10-CM | POA: Insufficient documentation

## 2016-05-16 DIAGNOSIS — M25559 Pain in unspecified hip: Secondary | ICD-10-CM | POA: Diagnosis not present

## 2016-05-16 DIAGNOSIS — E559 Vitamin D deficiency, unspecified: Secondary | ICD-10-CM | POA: Diagnosis not present

## 2016-05-16 DIAGNOSIS — M5412 Radiculopathy, cervical region: Secondary | ICD-10-CM | POA: Insufficient documentation

## 2016-05-16 DIAGNOSIS — M79601 Pain in right arm: Secondary | ICD-10-CM

## 2016-05-16 DIAGNOSIS — M797 Fibromyalgia: Secondary | ICD-10-CM | POA: Insufficient documentation

## 2016-05-16 DIAGNOSIS — M79602 Pain in left arm: Secondary | ICD-10-CM | POA: Diagnosis not present

## 2016-05-16 DIAGNOSIS — K589 Irritable bowel syndrome without diarrhea: Secondary | ICD-10-CM | POA: Insufficient documentation

## 2016-05-16 DIAGNOSIS — M1990 Unspecified osteoarthritis, unspecified site: Secondary | ICD-10-CM | POA: Diagnosis not present

## 2016-05-16 DIAGNOSIS — Z969 Presence of functional implant, unspecified: Secondary | ICD-10-CM | POA: Diagnosis not present

## 2016-05-16 DIAGNOSIS — G894 Chronic pain syndrome: Secondary | ICD-10-CM | POA: Diagnosis not present

## 2016-05-16 DIAGNOSIS — F1190 Opioid use, unspecified, uncomplicated: Secondary | ICD-10-CM | POA: Diagnosis not present

## 2016-05-16 DIAGNOSIS — M542 Cervicalgia: Secondary | ICD-10-CM | POA: Diagnosis not present

## 2016-05-16 DIAGNOSIS — I10 Essential (primary) hypertension: Secondary | ICD-10-CM | POA: Diagnosis not present

## 2016-05-16 DIAGNOSIS — J45909 Unspecified asthma, uncomplicated: Secondary | ICD-10-CM | POA: Diagnosis not present

## 2016-05-16 DIAGNOSIS — Z4542 Encounter for adjustment and management of neuropacemaker (brain) (peripheral nerve) (spinal cord): Secondary | ICD-10-CM

## 2016-05-16 DIAGNOSIS — G8929 Other chronic pain: Secondary | ICD-10-CM | POA: Insufficient documentation

## 2016-05-16 DIAGNOSIS — M47816 Spondylosis without myelopathy or radiculopathy, lumbar region: Secondary | ICD-10-CM | POA: Insufficient documentation

## 2016-05-16 DIAGNOSIS — K219 Gastro-esophageal reflux disease without esophagitis: Secondary | ICD-10-CM | POA: Diagnosis not present

## 2016-05-16 DIAGNOSIS — Z462 Encounter for fitting and adjustment of other devices related to nervous system and special senses: Secondary | ICD-10-CM

## 2016-05-16 DIAGNOSIS — F411 Generalized anxiety disorder: Secondary | ICD-10-CM | POA: Diagnosis not present

## 2016-05-16 DIAGNOSIS — Z6841 Body Mass Index (BMI) 40.0 and over, adult: Secondary | ICD-10-CM | POA: Diagnosis not present

## 2016-05-16 DIAGNOSIS — Z79891 Long term (current) use of opiate analgesic: Secondary | ICD-10-CM | POA: Diagnosis not present

## 2016-05-16 DIAGNOSIS — M791 Myalgia: Secondary | ICD-10-CM | POA: Diagnosis not present

## 2016-05-16 DIAGNOSIS — R252 Cramp and spasm: Secondary | ICD-10-CM | POA: Diagnosis not present

## 2016-05-16 DIAGNOSIS — Z87442 Personal history of urinary calculi: Secondary | ICD-10-CM | POA: Diagnosis not present

## 2016-05-16 DIAGNOSIS — M7918 Myalgia, other site: Secondary | ICD-10-CM

## 2016-05-16 DIAGNOSIS — M329 Systemic lupus erythematosus, unspecified: Secondary | ICD-10-CM | POA: Diagnosis not present

## 2016-05-16 HISTORY — DX: Encounter for adjustment and management of neurostimulator: Z45.42

## 2016-05-16 HISTORY — DX: Presence of other specified functional implants: Z96.89

## 2016-05-16 MED ORDER — OXYCODONE HCL 5 MG PO TABS: 5.0000 mg | ORAL_TABLET | Freq: Four times a day (QID) | ORAL | 0 refills | Status: DC | PRN

## 2016-05-16 MED ORDER — METAXALONE 800 MG PO TABS: 800.0000 mg | ORAL_TABLET | Freq: Three times a day (TID) | ORAL | 0 refills | Status: DC

## 2016-05-16 NOTE — Progress Notes (Signed)
Patient's Name: Mary Cruz  MRN: [id number]  Referring Provider: Silverio Decamp,*  DOB: 1975-12-24  PCP: Marcial Pacas, DO  DOS: 05/16/2016  Note by: Kathlen Brunswick. Dossie Arbour, MD  Service setting: Ambulatory outpatient  Specialty: Interventional Pain Management  Location: [facility] (AMB) Pain Management Facility    Patient type: Established   Primary Reason(s) for Visit: Encounter for prescription drug management & post-procedure evaluation of chronic illness with mild to moderate exacerbation(Level of risk: moderate) CC: Neck Pain (back of the neck)  HPI  Mary Cruz is a 41 y.o. year old, female patient, who comes today for a post-procedure evaluation and medication management. She has GERD; IRRITABLE BOWEL SYNDROME; KIDNEY STONES; Essential hypertension, benign; Chronic pain syndrome; Generalized anxiety disorder; Obesity; Long term current use of opiate analgesic; Long term prescription opiate use; Opiate use (30 MME/Day); Opiate dependence ([place]); Encounter for therapeutic drug level monitoring; Musculoskeletal pain; Muscle cramping; Osteoarthrosis; Vitamin D insufficiency; Presence of functional implant ([employer] Lumbar spinal cord stimulator implant); Encounter for management of implanted device; Chronic low back pain (Bilateral) (R>L); Chronic lumbar radicular pain (Right L5 dermatome; Left S1 Dermatome) (Bilateral) (R>L); Chronic hip pain (Location of Tertiary source of pain) (Bilateral) (R>L); Chronic neck pain (Location of Primary Source of Pain) (Bilateral) (L>R); Chronic upper extremity pain (Location of Secondary source of pain) (Bilateral) (L>R); Morbid obesity ([place]); Nicotine dependence; Chronic cervical radicular pain (C7 Dermatome) (Bilateral) (L>R); Chronic lower extremity pain (Bilateral) (R>L); Right knee pain; Anxiety and depression; Implant pain at battery site (right buttocks area); Lumbar spondylosis; Lumbar facet syndrome (Bilateral) (R>L); Positive ANA (antinuclear antibody);  Spinal cord stimulator status; and Battery end of life of spinal cord stimulator on her problem list. Her primarily concern today is the Neck Pain (back of the neck)  Pain Assessment: Self-Reported Pain Score: 2 /10             Reported level is compatible with observation.       Pain Type: Chronic pain Pain Location: Neck Pain Orientation: Posterior Pain Descriptors / Indicators: Aching, Constant, Radiating, Burning, Sharp Pain Frequency: Constant  Mary Cruz was last seen on 02/21/2016 for a procedure. During today's appointment we reviewed Mary Cruz post-procedure results, as well as her outpatient medication regimen. The patient indicates having done very well after the bilateral lumbar facet radiofrequency with 90% relief of her low back pain. At this point, she comes in for follow-up and medication refill. She was met and seen by the representative from [employer] to see if they could help with her spinal cord stim later. As it turns out, her device is reaching end of battery life. The patient has expressed her desire to change the entire unit to an MRI compatible, rechargeable unit. I will be referring the patient to Dr. Clydell Hakim at the [facility] neurosurgery and spine in [place] for this replacement. I will continue to manage the patient's pain medication. Today she has received enough refills to last her for 3 months.  Further details on both, my assessment(s), as well as the proposed treatment plan, please see below.  Controlled Substance Pharmacotherapy Assessment REMS (Risk Evaluation and Mitigation Strategy)  Analgesic:Oxycodone IR 5 mg every 6 hours when necessary for pain. (20 mg/day) MME/day:30 mg/day.  Evon Slack, RN  05/16/2016  1:29 PM  Sign at close encounter Nursing Pain Medication Assessment:  Safety precautions to be maintained throughout the outpatient stay will include: orient to surroundings, keep bed in low position, maintain call bell within reach at all  times,  provide assistance with transfer out of bed and ambulation.  Medication Inspection Compliance: Pill count conducted under aseptic conditions, in front of the patient. Neither the pills nor the bottle was removed from the patient's sight at any time. Once count was completed pills were immediately returned to the patient in their original bottle.  Medication: Oxycodone IR Pill/Patch Count: 47 of 120 pills remain Pill/Patch Appearance: Markings consistent with prescribed medication Bottle Appearance: Standard pharmacy container. Clearly labeled. Filled Date: 03 / 20 / 2018 Last Medication intake:  Today   Pharmacokinetics: Liberation and absorption (onset of action): WNL Distribution (time to peak effect): WNL Metabolism and excretion (duration of action): WNL         Pharmacodynamics: Desired effects: Analgesia: Mary Cruz reports >50% benefit. Functional ability: Patient reports that medication allows her to accomplish basic ADLs Clinically meaningful improvement in function (CMIF): Sustained CMIF goals met Perceived effectiveness: Described as relatively effective, allowing for increase in activities of daily living (ADL) Undesirable effects: Side-effects or Adverse reactions: None reported Monitoring: [place] PMP: Online review of the past 28-monthperiod conducted. Compliant with practice rules and regulations List of all UDS test(s) done:  Lab Results  Component Value Date   TOXASSSELUR FINAL 06/29/2015   TOXASSSELUR FINAL 04/05/2015   [place] 12[place]19[place]2014   Last UDS on record: ToxAssure Select 13  Date Value Ref Range Status  06/29/2015 FINAL  Final    Comment:    ==================================================================== TOXASSURE SELECT 13 (MW) ==================================================================== Test                             Result       Flag       Units Drug Present and Declared for Prescription Verification   Oxycodone                       419          EXPECTED   ng/mg creat   Oxymorphone                    481          EXPECTED   ng/mg creat   Noroxycodone                   1119         EXPECTED   ng/mg creat   Noroxymorphone                 123          EXPECTED   ng/mg creat    Sources of oxycodone are scheduled prescription medications.    Oxymorphone, noroxycodone, and noroxymorphone are expected    metabolites of oxycodone. Oxymorphone is also available as a    scheduled prescription medication. Drug Absent but Declared for Prescription Verification   Clonazepam                     Not Detected UNEXPECTED ng/mg creat ==================================================================== Test                      Result    Flag   Units      Ref Range   Creatinine              52               mg/dL      >=20 ====================================================================  Declared Medications:  The flagging and interpretation on this report are based on the  following declared medications.  Unexpected results may arise from  inaccuracies in the declared medications.  **Note: The testing scope of this panel includes these medications:  Clonazepam (Klonopin)  Oxycodone  **Note: The testing scope of this panel does not include following  reported medications:  Amitriptyline (Elavil)  Cyclobenzaprine (Flexeril)  Duloxetine (Cymbalta)  Furosemide (Lasix)  Hydrochlorothiazide (Prinzide)  Hydrochlorothiazide (Zestoretic)  Lisinopril (Prinzide)  Lisinopril (Zestoretic)  Meclizine (Antivert)  Metformin  Omeprazole (Prilosec)  Vitamin D3 ==================================================================== For clinical consultation, please call [phone]. ====================================================================    UDS interpretation: Compliant          Medication Assessment Form: Reviewed. Patient indicates being compliant with therapy Treatment compliance: Compliant Risk Assessment  Profile: Aberrant behavior: See prior evaluations. None observed or detected today Comorbid factors increasing risk of overdose: See prior notes. No additional risks detected today Risk of substance use disorder (SUD): Low Opioid Risk Tool (ORT) Total Score: 0  Interpretation Table:  Score <3 = Low Risk for SUD  Score between 4-7 = Moderate Risk for SUD  Score >8 = High Risk for Opioid Abuse   Risk Mitigation Strategies:  Patient Counseling: Covered Patient-Prescriber Agreement (PPA): Present and active  Notification to other healthcare providers: Done  Pharmacologic Plan: No change in therapy, at this time  Post-Procedure Assessment  02/21/2016 Procedure: Left lumbar facet radiofrequency ablation under fluoroscopic guidance and IV sedation Pre-procedure pain score:  3/10 Post-procedure pain score: 0/10 (100% relief) Influential Factors: BMI: 43.77 kg/m Intra-procedural challenges: None observed Assessment challenges: None detected         Post-procedural side-effects, adverse reactions, or complications: None reported Reported issues: None  Sedation: Sedation provided. When no sedatives are used, the analgesic levels obtained are directly associated to the effectiveness of the local anesthetics. However, when sedation is provided, the level of analgesia obtained during the initial 1 hour following the intervention, is believed to be the result of a combination of factors. These factors may include, but are not limited to: 1. The effectiveness of the local anesthetics used. 2. The effects of the analgesic(s) and/or anxiolytic(s) used. 3. The degree of discomfort experienced by the patient at the time of the procedure. 4. The patients ability and reliability in recalling and recording the events. 5. The presence and influence of possible secondary gains and/or psychosocial factors. Reported result: Relief experienced during the 1st hour after the procedure: 100 % (Ultra-Short Term  Relief) Interpretative annotation: Analgesia during this period is likely to be Local Anesthetic and/or IV Sedative (Analgesic/Anxiolitic) related.          Effects of local anesthetic: The analgesic effects attained during this period are directly associated to the localized infiltration of local anesthetics and therefore cary significant diagnostic value as to the etiological location, or anatomical origin, of the pain. Expected duration of relief is directly dependent on the pharmacodynamics of the local anesthetic used. Long-acting (4-6 hours) anesthetics used.  Reported result: Relief during the next 4 to 6 hour after the procedure: 90 % (Short-Term Relief) Interpretative annotation: Complete relief would suggest area to be the source of the pain.          Long-term benefit: Defined as the period of time past the expected duration of local anesthetics. With the possible exception of prolonged sympathetic blockade from the local anesthetics, benefits during this period are typically attributed to, or associated with, other factors such as analgesic sensory neuropraxia,  antiinflammatory effects, or beneficial biochemical changes provided by agents other than the local anesthetics Reported result: Extended relief following procedure: 90 % (Long-Term Relief) Interpretative annotation: Good relief. Possible therapeutic success. Benefit could signal adequate RF ablation  Current benefits: Defined as persistent relief that continues at this point in time.   Reported results: Treated area: 90 % Mary Cruz reports improvement in function Interpretative annotation: Long-term benefit would suggest adequate RF ablation  Interpretation: Results would suggest a successful therapeutic intervention.          Laboratory Chemistry  Inflammation Markers Lab Results  Component Value Date   CRP 1.0 (H) 04/05/2015   ESRSEDRATE 35 (H) 04/05/2015   (CRP: Acute Phase) (ESR: Chronic Phase) Renal Function  Markers Lab Results  Component Value Date   BUN 11 11/24/2015   CREATININE 0.94 11/24/2015   GFRAA >60 11/24/2015   GFRNONAA >60 11/24/2015   Hepatic Function Markers Lab Results  Component Value Date   AST 16 09/11/2015   ALT 19 09/11/2015   ALBUMIN 4.3 09/11/2015   ALKPHOS 103 09/11/2015   Electrolytes Lab Results  Component Value Date   NA 138 11/24/2015   K 3.7 11/24/2015   CL 105 11/24/2015   CALCIUM 9.2 11/24/2015   MG 2.0 04/05/2015   Neuropathy Markers No results found for: [id number] Bone Pathology Markers Lab Results  Component Value Date   ALKPHOS 103 09/11/2015   VD25OH 38 01/20/2015   CALCIUM 9.2 11/24/2015   Coagulation Parameters Lab Results  Component Value Date   PLT 276 11/24/2015   Cardiovascular Markers Lab Results  Component Value Date   HGB 11.9 (L) 11/24/2015   HCT 36.8 11/24/2015   Note: Lab results reviewed.  Recent Diagnostic Imaging Review  No results found. Note: Imaging results reviewed.          Meds  The patient has a current medication list which includes the following prescription(s): albuterol, amitriptyline, vitamin d3, duloxetine, hydroxychloroquine, lisinopril-hydrochlorothiazide, metaxalone, omeprazole, oxycodone, pregabalin, oxycodone, and oxycodone.  Current Outpatient Prescriptions on File Prior to Visit  Medication Sig  . albuterol (PROVENTIL HFA;VENTOLIN HFA) 108 (90 Base) MCG/ACT inhaler Inhale 1-2 puffs into the lungs every 6 (six) hours as needed for wheezing or shortness of breath.  [name] amitriptyline (ELAVIL) 100 MG tablet TAKE 1 TABLET (100 MG TOTAL) BY MOUTH AT BEDTIME.  [name] Cholecalciferol (VITAMIN D3) 2000 units TABS Take 1 tablet by mouth daily.  . DULoxetine (CYMBALTA) 60 MG capsule Take 1 capsule (60 mg total) by mouth 2 (two) times daily.  . hydroxychloroquine (PLAQUENIL) 200 MG tablet 2 (two) times daily.   [name] lisinopril-hydrochlorothiazide (PRINZIDE,ZESTORETIC) 10-12.5 MG tablet Take 0.5 tablets by mouth  daily.  [name] omeprazole (PRILOSEC) 40 MG capsule Take 1 capsule (40 mg total) by mouth 2 (two) times daily.   No current facility-administered medications on file prior to visit.    ROS  Constitutional: Denies any fever or chills Gastrointestinal: No reported hemesis, hematochezia, vomiting, or acute GI distress Musculoskeletal: Denies any acute onset joint swelling, redness, loss of ROM, or weakness Neurological: No reported episodes of acute onset apraxia, aphasia, dysarthria, agnosia, amnesia, paralysis, loss of coordination, or loss of consciousness  Allergies  Mary Cruz is allergic to keflex [cephalexin]; amoxicillin; bactrim [sulfamethoxazole-trimethoprim]; celebrex [celecoxib]; doxycycline; erythromycin; hydromorphone hcl; latex; nabumetone; and pentazocine lactate.  [place]  Drug: Mary Cruz  reports that she does not use drugs. Alcohol:  reports that she does not drink alcohol. Tobacco:  reports that she quit smoking about 9  months ago. She has never used smokeless tobacco. Medical:  has a past medical history of Abscess of axillary fold (10/10/2013); Acute bronchitis with asthma with acute exacerbation (01/23/2015); Fibromyalgia; Hypertension; Lupus; Osteoarthritis; Pain; and Sepsis affecting skin ([place]) (10/10/2013). Family: family history includes Asthma in her mother; Depression in her mother.  Past Surgical History:  Procedure Laterality Date  . BREAST SURGERY    . CHOLECYSTECTOMY  2000  . ENDOMETRIAL ABLATION    . KNEE SURGERY    . KNEE SURGERY Right 02-2015  . OCCIPITAL NERVE STIMULATOR INSERTION    . SHOULDER SURGERY    . spinal cord stimulator    . SPINAL CORD STIMULATOR IMPLANT  2008  . TUBAL LIGATION  2000   Constitutional Exam  General appearance: Well nourished, well developed, and well hydrated. In no apparent acute distress Vitals:   05/16/16 1316  BP: 125/78  Pulse: (!) 106  Resp: 16  Temp: 98.2 F (36.8 C)  TempSrc: Oral  SpO2: 98%  Weight: 255 lb  (115.7 kg)  Height: 5' 4"  (1.626 m)   BMI Assessment: Estimated body mass index is 43.77 kg/m as calculated from the following:   Height as of this encounter: 5' 4"  (1.626 m).   Weight as of this encounter: 255 lb (115.7 kg).  BMI interpretation table: BMI level Category Range association with higher incidence of chronic pain  <18 kg/m2 Underweight   18.5-24.9 kg/m2 Ideal body weight   25-29.9 kg/m2 Overweight Increased incidence by 20%  30-34.9 kg/m2 Obese (Class I) Increased incidence by 68%  35-39.9 kg/m2 Severe obesity (Class II) Increased incidence by 136%  >40 kg/m2 Extreme obesity (Class III) Increased incidence by 254%   BMI Readings from Last 4 Encounters:  05/16/16 43.77 kg/m  05/05/16 44.80 kg/m  03/04/16 43.08 kg/m  02/21/16 41.54 kg/m   Wt Readings from Last 4 Encounters:  05/16/16 255 lb (115.7 kg)  05/05/16 261 lb (118.4 kg)  03/04/16 251 lb (113.9 kg)  02/21/16 242 lb (109.8 kg)  Psych/Mental status: Alert, oriented x 3 (person, place, & time)       Eyes: PERLA Respiratory: No evidence of acute respiratory distress  Cervical Spine Exam  Inspection: No masses, redness, or swelling Alignment: Symmetrical Functional ROM: Unrestricted ROM Stability: No instability detected Muscle strength & Tone: Functionally intact Sensory: Unimpaired Palpation: No palpable anomalies  Upper Extremity (UE) Exam    Side: Right upper extremity  Side: Left upper extremity  Inspection: No masses, redness, swelling, or asymmetry. No contractures  Inspection: No masses, redness, swelling, or asymmetry. No contractures  Functional ROM: Unrestricted ROM          Functional ROM: Unrestricted ROM          Muscle strength & Tone: Functionally intact  Muscle strength & Tone: Functionally intact  Sensory: Unimpaired  Sensory: Unimpaired  Palpation: No palpable anomalies  Palpation: No palpable anomalies  Specialized Test(s): Deferred         Specialized Test(s): Deferred           Thoracic Spine Exam  Inspection: No masses, redness, or swelling Alignment: Symmetrical Functional ROM: Unrestricted ROM Stability: No instability detected Sensory: Unimpaired Muscle strength & Tone: No palpable anomalies  Lumbar Spine Exam  Inspection: No masses, redness, or swelling Alignment: Symmetrical Functional ROM: Improved after treatment Stability: No instability detected Muscle strength & Tone: Functionally intact Sensory: Unimpaired Palpation: No palpable anomalies Provocative Tests: Lumbar Hyperextension and rotation test: evaluation deferred today  Patrick's Maneuver: evaluation deferred today              Gait & Posture Assessment  Ambulation: Unassisted Gait: Relatively normal for age and body habitus Posture: WNL   Lower Extremity Exam    Side: Right lower extremity  Side: Left lower extremity  Inspection: No masses, redness, swelling, or asymmetry. No contractures  Inspection: No masses, redness, swelling, or asymmetry. No contractures  Functional ROM: Unrestricted ROM          Functional ROM: Unrestricted ROM          Muscle strength & Tone: Functionally intact  Muscle strength & Tone: Functionally intact  Sensory: Unimpaired  Sensory: Unimpaired  Palpation: No palpable anomalies  Palpation: No palpable anomalies   Assessment  Primary Diagnosis & Pertinent Problem List: The primary encounter diagnosis was Chronic neck pain (Location of Primary Source of Pain) (Bilateral) (L>R). Diagnoses of Chronic pain of both upper extremities, Chronic hip pain, unspecified laterality, Chronic cervical radicular pain (C7 Dermatome) (Bilateral) (L>R), Long term prescription opiate use, Opiate use (30 MME/Day), Chronic pain syndrome, Muscle cramping, Musculoskeletal pain, Spinal cord stimulator status, Battery end of life of spinal cord stimulator, and Presence of functional implant ([employer] Lumbar spinal cord stimulator implant) were also pertinent to this  visit.  Status Diagnosis  Controlled Controlled Controlled 1. Chronic neck pain (Location of Primary Source of Pain) (Bilateral) (L>R)   2. Chronic pain of both upper extremities   3. Chronic hip pain, unspecified laterality   4. Chronic cervical radicular pain (C7 Dermatome) (Bilateral) (L>R)   5. Long term prescription opiate use   6. Opiate use (30 MME/Day)   7. Chronic pain syndrome   8. Muscle cramping   9. Musculoskeletal pain   10. Spinal cord stimulator status   11. Battery end of life of spinal cord stimulator   12. Presence of functional implant ([employer] Lumbar spinal cord stimulator implant)      Plan of Care  Pharmacotherapy (Medications Ordered): Meds ordered this encounter  Medications  . oxyCODONE (OXY IR/ROXICODONE) 5 MG immediate release tablet    Sig: Take 1 tablet (5 mg total) by mouth every 6 (six) hours as needed for severe pain.    Dispense:  120 tablet    Refill:  0    Patient may have prescription filled one day early if pharmacy is closed on scheduled refill date. Do not fill until: 07/28/16 To last until: 08/27/16  . oxyCODONE (OXY IR/ROXICODONE) 5 MG immediate release tablet    Sig: Take 1 tablet (5 mg total) by mouth every 6 (six) hours as needed for severe pain.    Dispense:  120 tablet    Refill:  0    Patient may have prescription filled one day early if pharmacy is closed on scheduled refill date. Do not fill until: 06/28/16 To last until: 07/28/16  . oxyCODONE (OXY IR/ROXICODONE) 5 MG immediate release tablet    Sig: Take 1 tablet (5 mg total) by mouth every 6 (six) hours as needed for severe pain.    Dispense:  120 tablet    Refill:  0    Patient may have prescription filled one day early if pharmacy is closed on scheduled refill date. Do not fill until: 05/29/16 To last until: 06/28/16  . metaxalone (SKELAXIN) 800 MG tablet    Sig: Take 1 tablet (800 mg total) by mouth 3 (three) times daily.    Dispense:  270 tablet    Refill:  0  Do  not place this medication, or any other prescription from our practice, on "Automatic Refill". Patient may have prescription filled one day early if pharmacy is closed on scheduled refill date.   New Prescriptions   No medications on file   Medications administered today: Mary Cruz had no medications administered during this visit. Lab-work, procedure(s), and/or referral(s): Orders Placed This Encounter  Procedures  . Ambulatory referral to Pain Clinic   Imaging and/or referral(s): AMB REFERRAL TO PAIN CLINIC  Interventional therapies: Planned, scheduled, and/or pending:   Replacement of lumbar spinal cord stimulator by Dr. Clydell Hakim. The patient has indicated wanting to have an MRI compatible, rechargeable unit.    Considering:   Palliative Bilateral lumbar facet radiofrequency.    Palliative PRN treatment(s):   Palliative left-sided C7-T1 cervical epidural steroid injection under fluoroscopic guidance and IV sedation.    Provider-requested follow-up: Return in about 3 months (around 08/15/2016) for (Nurse Practitioner) Med-Mgmt.  No future appointments. Primary Care Physician: Marcial Pacas, DO Location: [facility] Note by: Kathlen Brunswick. Dossie Arbour, M.D, DABA, DABAPM, DABPM, DABIPP, FIPP Date: 05/16/2016; Time: 2:15 PM  Pain Score Disclaimer: We use the NRS-11 scale. This is a self-reported, subjective measurement of pain severity with only modest accuracy. It is used primarily to identify changes within a particular patient. It must be understood that outpatient pain scales are significantly less accurate that those used for research, where they can be applied under ideal controlled circumstances with minimal exposure to variables. In reality, the score is likely to be a combination of pain intensity and pain affect, where pain affect describes the degree of emotional arousal or changes in action readiness caused by the sensory experience of pain. Factors  such as social and work situation, setting, emotional state, anxiety levels, expectation, and prior pain experience may influence pain perception and show large inter-individual differences that may also be affected by time variables.  Patient instructions provided during this appointment: Patient Instructions  Pain Management Discharge Instructions  General Discharge Instructions :  If you need to reach your doctor call: Monday-Friday 8:00 am - 4:00 pm at [phone] or toll free [phone].  After clinic hours [phone] to have operator reach doctor.  Bring all of your medication bottles to all your appointments in the pain clinic.  To cancel or reschedule your appointment with Pain Management please remember to call 24 hours in advance to avoid a fee.  Refer to the educational materials which you have been given on: General Risks, I had my Procedure. Discharge Instructions, Post Sedation.  Post Procedure Instructions:  The drugs you were given will stay in your system until tomorrow, so for the next 24 hours you should not drive, make any legal decisions or drink any alcoholic beverages.  You may eat anything you prefer, but it is better to start with liquids then soups and crackers, and gradually work up to solid foods.  Please notify your doctor immediately if you have any unusual bleeding, trouble breathing or pain that is not related to your normal pain.  Depending on the type of procedure that was done, some parts of your body may feel week and/or numb.  This usually clears up by tonight or the next day.  Walk with the use of an assistive device or accompanied by an adult for the 24 hours.  You may use ice on the affected area for the first 24 hours.  Put ice in a Ziploc bag and cover with a towel  and place against area 15 minutes on 15 minutes off.  You may switch to heat after 24 hours.

## 2016-05-16 NOTE — Telephone Encounter (Signed)
Dr Dossie Arbour wanted pt to be referred to [facility] for replacement of lumbar spinal cord stimulator

## 2016-05-16 NOTE — Patient Instructions (Signed)

## 2016-05-16 NOTE — Progress Notes (Signed)
Nursing Pain Medication Assessment:  Safety precautions to be maintained throughout the outpatient stay will include: orient to surroundings, keep bed in low position, maintain call bell within reach at all times, provide assistance with transfer out of bed and ambulation.  Medication Inspection Compliance: Pill count conducted under aseptic conditions, in front of the patient. Neither the pills nor the bottle was removed from the patient's sight at any time. Once count was completed pills were immediately returned to the patient in their original bottle.  Medication: Oxycodone IR Pill/Patch Count: 47 of 120 pills remain Pill/Patch Appearance: Markings consistent with prescribed medication Bottle Appearance: Standard pharmacy container. Clearly labeled. Filled Date: 03 / 20 / 2018 Last Medication intake:  Today

## 2016-06-06 ENCOUNTER — Other Ambulatory Visit: Payer: Self-pay | Admitting: Anesthesiology

## 2016-06-16 ENCOUNTER — Encounter (HOSPITAL_COMMUNITY): Payer: Self-pay

## 2016-06-16 ENCOUNTER — Emergency Department (HOSPITAL_COMMUNITY): Payer: BLUE CROSS/BLUE SHIELD

## 2016-06-16 ENCOUNTER — Emergency Department (HOSPITAL_COMMUNITY)
Admission: EM | Admit: 2016-06-16 | Discharge: 2016-06-16 | Disposition: A | Discharge status: Home / Self Care | Payer: BLUE CROSS/BLUE SHIELD | Attending: Emergency Medicine | Admitting: Emergency Medicine

## 2016-06-16 DIAGNOSIS — M7711 Lateral epicondylitis, right elbow: Secondary | ICD-10-CM | POA: Diagnosis not present

## 2016-06-16 DIAGNOSIS — Y999 Unspecified external cause status: Secondary | ICD-10-CM | POA: Insufficient documentation

## 2016-06-16 DIAGNOSIS — Y929 Unspecified place or not applicable: Secondary | ICD-10-CM | POA: Diagnosis not present

## 2016-06-16 DIAGNOSIS — W1839XA Other fall on same level, initial encounter: Secondary | ICD-10-CM | POA: Insufficient documentation

## 2016-06-16 DIAGNOSIS — Z79899 Other long term (current) drug therapy: Secondary | ICD-10-CM | POA: Insufficient documentation

## 2016-06-16 DIAGNOSIS — S5001XA Contusion of right elbow, initial encounter: Secondary | ICD-10-CM | POA: Diagnosis not present

## 2016-06-16 DIAGNOSIS — Y939 Activity, unspecified: Secondary | ICD-10-CM | POA: Diagnosis not present

## 2016-06-16 DIAGNOSIS — S59901A Unspecified injury of right elbow, initial encounter: Secondary | ICD-10-CM | POA: Diagnosis present

## 2016-06-16 DIAGNOSIS — Z87891 Personal history of nicotine dependence: Secondary | ICD-10-CM | POA: Insufficient documentation

## 2016-06-16 DIAGNOSIS — I10 Essential (primary) hypertension: Secondary | ICD-10-CM | POA: Diagnosis not present

## 2016-06-16 MED ORDER — NAPROXEN 500 MG PO TABS: 500.0000 mg | ORAL_TABLET | Freq: Two times a day (BID) | ORAL | 0 refills | Status: DC

## 2016-06-16 NOTE — ED Notes (Signed)
Pt states understanding of care given and follow up instructions.  Pt a/o and ambulated from ED with a steady gait

## 2016-06-16 NOTE — ED Provider Notes (Signed)
[facility] DEPT Provider Note   CSN: [id number] Arrival date & time: 06/16/16  1650   By signing my name below, I, Eunice Blase, attest that this documentation has been prepared under the direction and in the presence of Evalee Jefferson, PA-C. Electronically Signed: Eunice Blase, Scribe. 06/16/16. 6:59 PM.   History   Chief Complaint Chief Complaint  Patient presents with  . Arm Pain   The history is provided by the patient and medical records. No language interpreter was used.    Mary Cruz is a 40 y.o. right handed female with h/o multiple RUE sprains in the past year and chronic back pain, who presents to the Emergency Department with concern for persistent R elbow pain onset 4-5 days ago. Pt reportedly fell and her arm collided with a door jam 4-5 days ago. Associated mild quarter-sized bruising that has subsided noted. Pt describes 3/10, constant, sore, insignificantly improved pain that is worsened with extension of the wrist and rotation of the arm. No relief from oxycodone prescribed for chronic back pain. No other modifying factors noted. She is followed by an orthopedist for R knee pain and has a history of chronic regional pain syndrome and was last evaluated ~8 months ago. No other complaints at this time. She is scheduled to have a replacement spinal cord stimulator procedure later this week.  Past Medical History:  Diagnosis Date  . Abscess of axillary fold 10/10/2013  . Acute bronchitis with asthma with acute exacerbation 01/23/2015  . Fibromyalgia   . Hypertension   . Lupus   . Osteoarthritis   . Pain    chronic regional pain syndrome  . Sepsis affecting skin ([place]) 10/10/2013    Patient Active Problem List   Diagnosis Date Noted  . Spinal cord stimulator status 05/16/2016  . Battery end of life of spinal cord stimulator 05/16/2016  . Positive ANA (antinuclear antibody) 09/13/2015  . Lumbar spondylosis 06/29/2015  . Lumbar facet syndrome (Bilateral) (R>L)  06/29/2015  . Implant pain at battery site (right buttocks area) 04/05/2015  . Anxiety and depression 01/20/2015  . Right knee pain 01/10/2015  . Long term current use of opiate analgesic 12/19/2014  . Long term prescription opiate use 12/19/2014  . Opiate use (30 MME/Day) 12/19/2014  . Opiate dependence ([place]) 12/19/2014  . Encounter for therapeutic drug level monitoring 12/19/2014  . Musculoskeletal pain 12/19/2014  . Muscle cramping 12/19/2014  . Osteoarthrosis 12/19/2014  . Vitamin D insufficiency 12/19/2014  . 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 Encounter for management of implanted device 12/19/2014  . Chronic low back pain (Bilateral) (R>L) 12/19/2014  . Chronic lumbar radicular pain (Right L5 dermatome; Left S1 Dermatome) (Bilateral) (R>L) 12/19/2014  . Chronic hip pain (Location of Tertiary source of pain) (Bilateral) (R>L) 12/19/2014  . Chronic neck pain (Location of Primary Source of Pain) (Bilateral) (L>R) 12/19/2014  . Chronic upper extremity pain (Location of Secondary source of pain) (Bilateral) (L>R) 12/19/2014  . Morbid obesity ([place]) 12/19/2014  . Nicotine dependence 12/19/2014  . Chronic cervical radicular pain (C7 Dermatome) (Bilateral) (L>R) 12/19/2014  . Chronic lower extremity pain (Bilateral) (R>L) 12/19/2014  . Obesity 10/27/2014  . Generalized anxiety disorder 03/16/2014  . Essential hypertension, benign 07/09/2013  . Chronic pain syndrome 07/09/2013  . GERD 07/02/2007  . IRRITABLE BOWEL SYNDROME 07/02/2007  . KIDNEY STONES 07/02/2007    Past Surgical History:  Procedure Laterality Date  . BREAST SURGERY    . CHOLECYSTECTOMY  2000  .  ENDOMETRIAL ABLATION    . KNEE SURGERY    . KNEE SURGERY Right 02-2015  . OCCIPITAL NERVE STIMULATOR INSERTION    . SHOULDER SURGERY    . spinal cord stimulator    . SPINAL CORD STIMULATOR IMPLANT  2008  . TUBAL LIGATION  2000    OB History    No data  available       Home Medications    Prior to Admission medications   Medication Sig Start Date End Date Taking? Authorizing Provider  albuterol (PROVENTIL HFA;VENTOLIN HFA) 108 (90 Base) MCG/ACT inhaler Inhale 1-2 puffs into the lungs every 6 (six) hours as needed for wheezing or shortness of breath. 03/04/16   Fransico Meadow, PA-C  amitriptyline (ELAVIL) 100 MG tablet TAKE 1 TABLET (100 MG TOTAL) BY MOUTH AT BEDTIME. 03/27/16   Silverio Decamp, MD  Cholecalciferol (VITAMIN D3) 2000 units TABS Take 1 tablet by mouth daily.    [provider]  DULoxetine (CYMBALTA) 60 MG capsule Take 1 capsule (60 mg total) by mouth 2 (two) times daily. 09/11/15   Marcial Pacas, DO  hydroxychloroquine (PLAQUENIL) 200 MG tablet Take 200 mg by mouth 2 (two) times daily.  11/28/15   [provider]  lisinopril-hydrochlorothiazide (PRINZIDE,ZESTORETIC) 10-12.5 MG tablet Take 0.5 tablets by mouth daily. 04/02/16   Silverio Decamp, MD  metaxalone (SKELAXIN) 800 MG tablet Take 1 tablet (800 mg total) by mouth 3 (three) times daily. 05/29/16 08/27/16  Milinda Pointer, MD  naproxen (NAPROSYN) 500 MG tablet Take 1 tablet (500 mg total) by mouth 2 (two) times daily. 06/16/16   Evalee Jefferson, PA-C  omeprazole (PRILOSEC) 40 MG capsule Take 1 capsule (40 mg total) by mouth 2 (two) times daily. 03/13/16   Silverio Decamp, MD  oxyCODONE (OXY IR/ROXICODONE) 5 MG immediate release tablet Take 1 tablet (5 mg total) by mouth every 6 (six) hours as needed for severe pain. 07/28/16 08/27/16  Milinda Pointer, MD  pregabalin (LYRICA) 300 MG capsule Take 600 mg by mouth at bedtime.    [provider]    Family History Family History  Problem Relation Age of Onset  . Depression Mother     maternal grandmother  . Asthma Mother   . Fibromyalgia      aunt  . Stroke      grandmother  . Thyroid disease      grandmother    Social History Social History  Substance Use Topics  . Smoking  status: Former Smoker    Quit date: 08/01/2015  . Smokeless tobacco: Never Used  . Alcohol use No     Allergies   Keflex [cephalexin]; Amoxicillin; Bactrim [sulfamethoxazole-trimethoprim]; Celebrex [celecoxib]; Doxycycline; Erythromycin; Hydromorphone hcl; Latex; Nabumetone; and Pentazocine lactate   Review of Systems Review of Systems  Musculoskeletal: Positive for arthralgias. Negative for joint swelling.  Skin: Positive for color change. Negative for wound.  Neurological: Negative for weakness and numbness.  All other systems reviewed and are negative.    Physical Exam Updated Vital Signs Temp 97.7 F (36.5 C) (Oral)   Wt 255 lb (115.7 kg)   SpO2 100%   BMI 43.77 kg/m   Physical Exam  Constitutional: She is oriented to person, place, and time. She appears well-developed and well-nourished.  HENT:  Head: Normocephalic.  Eyes: EOM are normal.  Neck: Normal range of motion.  Pulmonary/Chest: Effort normal.  Abdominal: She exhibits no distension.  Musculoskeletal: Normal range of motion. She exhibits tenderness.  TTP over the R elbow  lateral malleolus; no deformity, effusion or crepitus with ROM; no bruising; forearm soft and non tender; pain increased with pronation, supination and with wrist extension; distal sensation intact; radial pulse is full; sore to palpation over belly of R bicep, no palpable deformity or disruption  Neurological: She is alert and oriented to person, place, and time.  Psychiatric: She has a normal mood and affect.  Nursing note and vitals reviewed.    ED Treatments / Results  DIAGNOSTIC STUDIES: Oxygen Saturation is 100% on RA, NL by my interpretation.    COORDINATION OF CARE: 6:55 PM-Discussed next steps with pt. Pt verbalized understanding and is agreeable with the plan. Will order medication. Pt prepared for d/c, advised of symptomatic care at home and return precautions.    Labs (all labs ordered are listed, but only abnormal results  are displayed) Labs Reviewed - No data to display  EKG  EKG Interpretation None       Radiology   Dg Elbow Complete Right  Result Date: 06/16/2016 CLINICAL DATA:  Trauma on Wednesday.  Persistent pain. EXAM: RIGHT ELBOW - COMPLETE 3+ VIEW COMPARISON:  07/30/2014 FINDINGS: No acute fracture or dislocation.  No joint effusion. IMPRESSION: No acute osseous abnormality. Electronically Signed   By: Abigail Miyamoto M.D.   On: 06/16/2016 18:02     Procedures Procedures (including critical care time)  Medications Ordered in ED Medications - No data to display   Initial Impression / Assessment and Plan / ED Course  I have reviewed the triage vital signs and the nursing notes.  Pertinent labs & imaging results that were available during my care of the patient were reviewed by me and considered in my medical decision making (see chart for details).     Discussed use of elbow band which pt will obtain.  activities as tolerated Naproxen, discussed ice and heat tx. Prn f/u with ortho if sx persist.  Final Clinical Impressions(s) / ED Diagnoses   Final diagnoses:  Contusion of right elbow, initial encounter  Lateral epicondylitis of right elbow    New Prescriptions Discharge Medication List as of 06/16/2016  7:01 PM    START taking these medications   Details  naproxen (NAPROSYN) 500 MG tablet Take 1 tablet (500 mg total) by mouth 2 (two) times daily., Starting Sun 06/16/2016, Print      I personally performed the services described in this documentation, which was scribed in my presence. The recorded information has been reviewed and is accurate.     Evalee Jefferson, PA-C 06/17/16 1410    Fredia Sorrow, MD 06/25/16 [phone]

## 2016-06-16 NOTE — ED Triage Notes (Signed)
Pt reports she fell into door jam on Wednesday and right elbow and and arm have been hurting. Has difficulty with extending right arm

## 2016-06-16 NOTE — Discharge Instructions (Signed)
Apply a heating pad to your elbow injury site 20 minutes several times daily.  Obtain a tennis elbow band to add compression as discussed (any medical supply or pharmacy should have this).  Your xrays are negative for fracture or dislocation.

## 2016-06-17 NOTE — Pre-Procedure Instructions (Signed)
Mary Cruz  06/17/2016      Mary Cruz [address] [place] [address] [place] [address] Phone: [phone] Fax: [place], [address] [address] Phone: [phone] Fax: [place], [place] [address] Phone: [phone] Fax: [phone]  [id number] [place], [place] [address] Phone: [phone] Fax: [phone]    Your procedure is scheduled on Fri, May 11 @ 7:30 AM  Report to [facility] at 5:30 AM  Call this number if you have problems the morning of surgery:  [phone]   Remember:  Do not eat food or drink liquids after midnight.  Take these medicines the morning of surgery with A SIP OF WATER Albuterol<Bring Your Inhaler With You>,Cymbalta(Duloxetine),Plaquenil(Hydroxychloroquine),Omeprazole(Prilosec),and Pain Pill(if needed)                Stop taking your Naproxen along with any Vitamins or Herbal Medications. No Goody's,BC's,Advil,Motrin,Ibuprofen,or Fish Oil.     Do not wear jewelry, make-up or nail polish.  Do not wear lotions, powders,perfumes, or deoderant.  Do not shave 48 hours prior to surgery.    Do not bring valuables to the hospital.  [facility] is not responsible for any belongings or valuables.  Contacts, dentures or bridgework may not be worn into surgery.  Leave your suitcase in the car.  After surgery it may be brought to your room.  For patients admitted to the hospital, discharge time will be determined by your treatment team.  Patients discharged the day of surgery will not be allowed to drive home.    [facility]  Before surgery, you can play an important role.  Because skin is  not sterile, your skin needs to be as free of germs as possible.  You can reduce the number of germs on you skin by washing with CHG (chlorahexidine gluconate) soap before surgery.  CHG is an antiseptic cleaner which kills germs and bonds with the skin to continue killing germs even after washing.  Please DO NOT use if you have an allergy to CHG or antibacterial soaps.  If your skin becomes reddened/irritated stop using the CHG and inform your nurse when you arrive at Short Stay.  Do not shave (including legs and underarms) for at least 48 hours prior to the first CHG shower.  You may shave your face.  Please follow these instructions carefully:   1.  Shower with CHG Soap the night before surgery and the                                morning of Surgery.  2.  If you choose to wash your hair, wash your hair first as usual with your       normal shampoo.  3.  After you shampoo, rinse your hair and body thoroughly to remove the                      Shampoo.  4.  Use CHG as you would any other liquid soap.  You can apply chg directly       to the skin and wash gently with  scrungie or a clean washcloth.  5.  Apply the CHG Soap to your body ONLY FROM THE NECK DOWN.        Do not use on open wounds or open sores.  Avoid contact with your eyes,       ears, mouth and genitals (private parts).  Wash genitals (private parts)       with your normal soap.  6.  Wash thoroughly, paying special attention to the area where your surgery        will be performed.  7.  Thoroughly rinse your body with warm water from the neck down.  8.  DO NOT shower/wash with your normal soap after using and rinsing off       the CHG Soap.  9.  Pat yourself dry with a clean towel.            10.  Wear clean pajamas.            11.  Place clean sheets on your bed the night of your first shower and do not        sleep with pets.  Day of Surgery  Do not apply any lotions/deoderants the morning of surgery.  Please wear clean clothes  to the hospital/surgery center.    Please read over the following fact sheets that you were given. Pain Booklet, Coughing and Deep Breathing, MRSA Information and Surgical Site Infection Prevention

## 2016-06-18 ENCOUNTER — Encounter (HOSPITAL_COMMUNITY): Payer: Self-pay

## 2016-06-18 ENCOUNTER — Encounter (HOSPITAL_COMMUNITY)
Admission: RE | Admit: 2016-06-18 | Discharge: 2016-06-18 | Disposition: A | Discharge status: Home / Self Care | Payer: BLUE CROSS/BLUE SHIELD | Source: Ambulatory Visit | Attending: Anesthesiology | Admitting: Anesthesiology

## 2016-06-18 DIAGNOSIS — Z881 Allergy status to other antibiotic agents status: Secondary | ICD-10-CM | POA: Diagnosis not present

## 2016-06-18 DIAGNOSIS — K219 Gastro-esophageal reflux disease without esophagitis: Secondary | ICD-10-CM | POA: Diagnosis not present

## 2016-06-18 DIAGNOSIS — Z79899 Other long term (current) drug therapy: Secondary | ICD-10-CM | POA: Diagnosis not present

## 2016-06-18 DIAGNOSIS — M545 Low back pain: Secondary | ICD-10-CM | POA: Diagnosis not present

## 2016-06-18 DIAGNOSIS — G8929 Other chronic pain: Secondary | ICD-10-CM | POA: Diagnosis present

## 2016-06-18 DIAGNOSIS — M797 Fibromyalgia: Secondary | ICD-10-CM | POA: Diagnosis not present

## 2016-06-18 DIAGNOSIS — Z888 Allergy status to other drugs, medicaments and biological substances status: Secondary | ICD-10-CM | POA: Diagnosis not present

## 2016-06-18 DIAGNOSIS — Z880 Allergy status to penicillin: Secondary | ICD-10-CM | POA: Diagnosis not present

## 2016-06-18 DIAGNOSIS — M329 Systemic lupus erythematosus, unspecified: Secondary | ICD-10-CM | POA: Diagnosis not present

## 2016-06-18 DIAGNOSIS — Z886 Allergy status to analgesic agent status: Secondary | ICD-10-CM | POA: Diagnosis not present

## 2016-06-18 DIAGNOSIS — Z882 Allergy status to sulfonamides status: Secondary | ICD-10-CM | POA: Diagnosis not present

## 2016-06-18 DIAGNOSIS — I10 Essential (primary) hypertension: Secondary | ICD-10-CM | POA: Diagnosis not present

## 2016-06-18 DIAGNOSIS — Z885 Allergy status to narcotic agent status: Secondary | ICD-10-CM | POA: Diagnosis not present

## 2016-06-18 DIAGNOSIS — Z91040 Latex allergy status: Secondary | ICD-10-CM | POA: Diagnosis not present

## 2016-06-18 DIAGNOSIS — G894 Chronic pain syndrome: Secondary | ICD-10-CM | POA: Diagnosis not present

## 2016-06-18 DIAGNOSIS — Z87891 Personal history of nicotine dependence: Secondary | ICD-10-CM | POA: Diagnosis not present

## 2016-06-18 LAB — CBC
HCT: 37.8 % (ref 36.0–46.0)
Hemoglobin: 11.9 g/dL — ABNORMAL LOW (ref 12.0–15.0)
MCH: 27.9 pg (ref 26.0–34.0)
MCHC: 31.5 g/dL (ref 30.0–36.0)
MCV: 88.5 fL (ref 78.0–100.0)
Platelets: 272 10*3/uL (ref 150–400)
RBC: 4.27 MIL/uL (ref 3.87–5.11)
RDW: 14.6 % (ref 11.5–15.5)
WBC: 8.4 10*3/uL (ref 4.0–10.5)

## 2016-06-18 LAB — BASIC METABOLIC PANEL
Anion gap: 11 (ref 5–15)
BUN: 9 mg/dL (ref 6–20)
CO2: 23 mmol/L (ref 22–32)
Calcium: 9.4 mg/dL (ref 8.9–10.3)
Chloride: 105 mmol/L (ref 101–111)
Creatinine, Ser: 0.89 mg/dL (ref 0.44–1.00)
GFR calc Af Amer: >60 mL/min (ref >60)
GFR calc non Af Amer: >60 mL/min (ref >60)
Glucose, Bld: 106 mg/dL — ABNORMAL HIGH (ref 65–99)
Potassium: 3.7 mmol/L (ref 3.5–5.1)
Sodium: 139 mmol/L (ref 135–145)

## 2016-06-18 LAB — HCG, SERUM, QUALITATIVE: PREG SERUM: NEGATIVE

## 2016-06-18 LAB — SURGICAL PCR SCREEN
MRSA, PCR: NEGATIVE
Staphylococcus aureus: NEGATIVE

## 2016-06-18 NOTE — Progress Notes (Signed)
PCP: [facility] in [place]  Cardiologist: pt denies  EKG: pt denies past year  Stress test: pt denies ever  ECHO: pt denies ever  Cardiac Cath: pt denies  Chest x-ray:pt denies past year

## 2016-06-19 NOTE — Progress Notes (Signed)
Anesthesia Chart Review:  Pt is a 41 year old female scheduled for lumbar spinal cord stimulator insertion on 06/21/2016 with Clydell Hakim, M.D.  [facility] includes:HTN. Former smoker. BMI 44  Medications include: Albuterol, Plaquenil, lisinopril-HCTZ, Prilosec  Preoperative labs reviewed.  EKG 06/18/16: NSR. Possible LA enlargement. Possible Inferior infarct, age undetermined.   If no changes, I anticipate pt can proceed with surgery as scheduled.   Willeen Cass, FNP-BC [facility] Short Stay Surgical Center/Anesthesiology Phone: [phone] 06/19/2016 12:17 PM

## 2016-06-20 MED ORDER — VANCOMYCIN HCL 10 G IV SOLR
1500.0000 mg | INTRAVENOUS | Status: AC
  Administered 2016-06-21: 1500 mg via INTRAVENOUS
  Filled 2016-06-20: qty 1500
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
## 2016-06-21 ENCOUNTER — Ambulatory Visit (HOSPITAL_COMMUNITY): Payer: BLUE CROSS/BLUE SHIELD

## 2016-06-21 ENCOUNTER — Ambulatory Visit (HOSPITAL_COMMUNITY)
Admission: RE | Admit: 2016-06-21 | Discharge: 2016-06-21 | Disposition: A | Discharge status: Home / Self Care | Payer: BLUE CROSS/BLUE SHIELD | Source: Ambulatory Visit | Attending: Anesthesiology | Admitting: Anesthesiology

## 2016-06-21 ENCOUNTER — Encounter (HOSPITAL_COMMUNITY): Payer: Self-pay | Admitting: *Deleted

## 2016-06-21 ENCOUNTER — Ambulatory Visit (HOSPITAL_COMMUNITY): Payer: BLUE CROSS/BLUE SHIELD | Admitting: Certified Registered Nurse Anesthetist

## 2016-06-21 ENCOUNTER — Encounter (HOSPITAL_COMMUNITY)
Admission: RE | Disposition: A | Discharge status: Home / Self Care | Payer: Self-pay | Source: Ambulatory Visit | Attending: Anesthesiology

## 2016-06-21 ENCOUNTER — Ambulatory Visit (HOSPITAL_COMMUNITY): Payer: BLUE CROSS/BLUE SHIELD | Admitting: Emergency Medicine

## 2016-06-21 DIAGNOSIS — Z886 Allergy status to analgesic agent status: Secondary | ICD-10-CM | POA: Insufficient documentation

## 2016-06-21 DIAGNOSIS — G894 Chronic pain syndrome: Secondary | ICD-10-CM | POA: Diagnosis not present

## 2016-06-21 DIAGNOSIS — Z79899 Other long term (current) drug therapy: Secondary | ICD-10-CM | POA: Insufficient documentation

## 2016-06-21 DIAGNOSIS — Z888 Allergy status to other drugs, medicaments and biological substances status: Secondary | ICD-10-CM | POA: Insufficient documentation

## 2016-06-21 DIAGNOSIS — Z91040 Latex allergy status: Secondary | ICD-10-CM | POA: Insufficient documentation

## 2016-06-21 DIAGNOSIS — Z882 Allergy status to sulfonamides status: Secondary | ICD-10-CM | POA: Insufficient documentation

## 2016-06-21 DIAGNOSIS — M797 Fibromyalgia: Secondary | ICD-10-CM | POA: Insufficient documentation

## 2016-06-21 DIAGNOSIS — Z419 Encounter for procedure for purposes other than remedying health state, unspecified: Secondary | ICD-10-CM

## 2016-06-21 DIAGNOSIS — Z880 Allergy status to penicillin: Secondary | ICD-10-CM | POA: Insufficient documentation

## 2016-06-21 DIAGNOSIS — M329 Systemic lupus erythematosus, unspecified: Secondary | ICD-10-CM | POA: Insufficient documentation

## 2016-06-21 DIAGNOSIS — M545 Low back pain: Secondary | ICD-10-CM | POA: Insufficient documentation

## 2016-06-21 DIAGNOSIS — K219 Gastro-esophageal reflux disease without esophagitis: Secondary | ICD-10-CM | POA: Insufficient documentation

## 2016-06-21 DIAGNOSIS — I10 Essential (primary) hypertension: Secondary | ICD-10-CM | POA: Insufficient documentation

## 2016-06-21 DIAGNOSIS — Z885 Allergy status to narcotic agent status: Secondary | ICD-10-CM | POA: Insufficient documentation

## 2016-06-21 DIAGNOSIS — Z87891 Personal history of nicotine dependence: Secondary | ICD-10-CM | POA: Insufficient documentation

## 2016-06-21 DIAGNOSIS — Z881 Allergy status to other antibiotic agents status: Secondary | ICD-10-CM | POA: Insufficient documentation

## 2016-06-21 HISTORY — PX: SPINAL CORD STIMULATOR INSERTION: SHX5378

## 2016-06-21 LAB — APTT: aPTT: 29 seconds (ref 24–36)

## 2016-06-21 LAB — PROTIME-INR
INR: 1.05
Prothrombin Time: 13.8 seconds (ref 11.4–15.2)

## 2016-06-21 SURGERY — INSERTION, SPINAL CORD STIMULATOR, LUMBAR
Anesthesia: Monitor Anesthesia Care | Site: Back

## 2016-06-21 MED ORDER — LIDOCAINE-EPINEPHRINE 1 %-1:100000 IJ SOLN
INTRAMUSCULAR | Status: DC | PRN
  Administered 2016-06-21 (×2): 20 mL
  Administered 2016-06-21: 17 mL

## 2016-06-21 MED ORDER — CHLORHEXIDINE GLUCONATE CLOTH 2 % EX PADS: 6.0000 | MEDICATED_PAD | Freq: Once | CUTANEOUS | Status: DC

## 2016-06-21 MED ORDER — PROPOFOL 10 MG/ML IV BOLUS
INTRAVENOUS | Status: DC | PRN
  Administered 2016-06-21: 20 mg via INTRAVENOUS

## 2016-06-21 MED ORDER — MEPERIDINE HCL 25 MG/ML IJ SOLN: 6.2500 mg | INTRAMUSCULAR | Status: DC | PRN

## 2016-06-21 MED ORDER — SODIUM CHLORIDE 0.9 % IR SOLN
Status: DC | PRN
  Administered 2016-06-21: 500 mL

## 2016-06-21 MED ORDER — MIDAZOLAM HCL 2 MG/2ML IJ SOLN
INTRAMUSCULAR | Status: AC
  Filled 2016-06-21: qty 2

## 2016-06-21 MED ORDER — ONDANSETRON HCL 4 MG/2ML IJ SOLN: 4.0000 mg | Freq: Once | INTRAMUSCULAR | Status: DC | PRN

## 2016-06-21 MED ORDER — PROPOFOL 10 MG/ML IV BOLUS
INTRAVENOUS | Status: AC
  Filled 2016-06-21: qty 20

## 2016-06-21 MED ORDER — OXYCODONE HCL 5 MG PO TABS
ORAL_TABLET | ORAL | Status: AC
  Filled 2016-06-21: qty 1

## 2016-06-21 MED ORDER — BACITRACIN-NEOMYCIN-POLYMYXIN OINTMENT TUBE: TOPICAL_OINTMENT | CUTANEOUS | Status: DC | PRN

## 2016-06-21 MED ORDER — OXYCODONE HCL 5 MG PO TABS
5.0000 mg | ORAL_TABLET | Freq: Once | ORAL | Status: AC
  Administered 2016-06-21: 5 mg via ORAL

## 2016-06-21 MED ORDER — LIDOCAINE-EPINEPHRINE 1 %-1:100000 IJ SOLN
INTRAMUSCULAR | Status: AC
  Filled 2016-06-21: qty 1

## 2016-06-21 MED ORDER — LIDOCAINE-EPINEPHRINE 1 %-1:100000 IJ SOLN
INTRAMUSCULAR | Status: AC
  Filled 2016-06-21: qty 2

## 2016-06-21 MED ORDER — MIDAZOLAM HCL 5 MG/5ML IJ SOLN
INTRAMUSCULAR | Status: DC | PRN
  Administered 2016-06-21: 2 mg via INTRAVENOUS

## 2016-06-21 MED ORDER — OXYCODONE HCL 5 MG PO TABS: 5.0000 mg | ORAL_TABLET | ORAL | 0 refills | Status: DC | PRN

## 2016-06-21 MED ORDER — FENTANYL CITRATE (PF) 250 MCG/5ML IJ SOLN
INTRAMUSCULAR | Status: AC
  Filled 2016-06-21: qty 5

## 2016-06-21 MED ORDER — BACITRACIN-NEOMYCIN-POLYMYXIN 400-5-5000 EX OINT
TOPICAL_OINTMENT | CUTANEOUS | Status: AC
  Filled 2016-06-21: qty 1

## 2016-06-21 MED ORDER — PROPOFOL 500 MG/50ML IV EMUL
INTRAVENOUS | Status: DC | PRN
  Administered 2016-06-21: 50 ug/kg/min via INTRAVENOUS

## 2016-06-21 MED ORDER — ONDANSETRON HCL 4 MG/2ML IJ SOLN
INTRAMUSCULAR | Status: DC | PRN
  Administered 2016-06-21: 4 mg via INTRAVENOUS

## 2016-06-21 MED ORDER — FENTANYL CITRATE (PF) 100 MCG/2ML IJ SOLN
INTRAMUSCULAR | Status: DC | PRN
  Administered 2016-06-21: 25 ug via INTRAVENOUS
  Administered 2016-06-21: 50 ug via INTRAVENOUS
  Administered 2016-06-21 (×3): 25 ug via INTRAVENOUS

## 2016-06-21 MED ORDER — LACTATED RINGERS IV SOLN
INTRAVENOUS | Status: DC | PRN
  Administered 2016-06-21: 07:00:00 via INTRAVENOUS

## 2016-06-21 MED ORDER — 0.9 % SODIUM CHLORIDE (POUR BTL) OPTIME
TOPICAL | Status: DC | PRN
  Administered 2016-06-21: 1000 mL

## 2016-06-21 SURGICAL SUPPLY — 63 items
ACC NRSTM 6.5FR LD INTRO STRL (MISCELLANEOUS) ×1 IMPLANT
BAG DECANTER FOR FLEXI CONT (MISCELLANEOUS) ×2 IMPLANT
BENZOIN TINCTURE PRP APPL 2/3 (GAUZE/BANDAGES/DRESSINGS) IMPLANT
BINDER ABDOMINAL 12 ML 46-62 (SOFTGOODS) ×2 IMPLANT
BLADE CLIPPER SURG (BLADE) IMPLANT
CARTRIDGE OIL MAESTRO DRILL (MISCELLANEOUS) ×1 IMPLANT
CHLORAPREP W/TINT 26ML (MISCELLANEOUS) ×2 IMPLANT
CLIP TI WIDE RED SMALL 6 (CLIP) ×2 IMPLANT
DERMABOND ADVANCED (GAUZE/BANDAGES/DRESSINGS) ×1
DERMABOND ADVANCED .7 DNX12 (GAUZE/BANDAGES/DRESSINGS) ×1 IMPLANT
DEVICE IMPLANT NEUROSTIMULATOR (Neuro Prosthesis/Implant) ×2 IMPLANT
DIFFUSER DRILL AIR PNEUMATIC (MISCELLANEOUS) ×2 IMPLANT
DRAPE C-ARM 42X72 X-RAY (DRAPES) ×2 IMPLANT
DRAPE C-ARMOR (DRAPES) ×2 IMPLANT
DRAPE LAPAROTOMY 100X72X124 (DRAPES) ×2 IMPLANT
DRAPE POUCH INSTRU U-SHP 10X18 (DRAPES) ×2 IMPLANT
DRAPE SURG 17X23 STRL (DRAPES) ×2 IMPLANT
DRSG OPSITE POSTOP 3X4 (GAUZE/BANDAGES/DRESSINGS) ×2 IMPLANT
DRSG OPSITE POSTOP 4X6 (GAUZE/BANDAGES/DRESSINGS) ×2 IMPLANT
ELECT REM PT RETURN 9FT ADLT (ELECTROSURGICAL) ×2
ELECTRODE REM PT RTRN 9FT ADLT (ELECTROSURGICAL) ×1 IMPLANT
GAUZE SPONGE 4X4 16PLY XRAY LF (GAUZE/BANDAGES/DRESSINGS) ×2 IMPLANT
GLOVE BIOGEL PI IND STRL 7.5 (GLOVE) ×1 IMPLANT
GLOVE BIOGEL PI INDICATOR 7.5 (GLOVE) ×1
GLOVE ECLIPSE 7.5 STRL STRAW (GLOVE) ×2 IMPLANT
GLOVE EXAM NITRILE LRG STRL (GLOVE) IMPLANT
GLOVE EXAM NITRILE XL STR (GLOVE) IMPLANT
GLOVE EXAM NITRILE XS STR PU (GLOVE) IMPLANT
GOWN STRL REUS W/ TWL LRG LVL3 (GOWN DISPOSABLE) ×1 IMPLANT
GOWN STRL REUS W/ TWL XL LVL3 (GOWN DISPOSABLE) ×1 IMPLANT
GOWN STRL REUS W/TWL 2XL LVL3 (GOWN DISPOSABLE) IMPLANT
GOWN STRL REUS W/TWL LRG LVL3 (GOWN DISPOSABLE) ×1
GOWN STRL REUS W/TWL XL LVL3 (GOWN DISPOSABLE) ×1
KIT BASIN OR (CUSTOM PROCEDURE TRAY) ×2 IMPLANT
KIT LEAD (Lead) ×4 IMPLANT
KIT NEURO ACCESSORY W/WRENCH (MISCELLANEOUS) ×2 IMPLANT
KIT ROOM TURNOVER OR (KITS) ×2 IMPLANT
NEEDLE 18GX1X1/2 (RX/OR ONLY) (NEEDLE) IMPLANT
NEEDLE HYPO 25X1 1.5 SAFETY (NEEDLE) ×2 IMPLANT
NS IRRIG 1000ML POUR BTL (IV SOLUTION) ×2 IMPLANT
OIL CARTRIDGE MAESTRO DRILL (MISCELLANEOUS) ×2
PACEMAKER KIT ACCES (MISCELLANEOUS) ×1
PACK LAMINECTOMY NEURO (CUSTOM PROCEDURE TRAY) ×2 IMPLANT
PAD ARMBOARD 7.5X6 YLW CONV (MISCELLANEOUS) ×2 IMPLANT
RECHARGER INTELLIS (NEUROSURGERY SUPPLIES) ×2 IMPLANT
REPROGRAMMER INTELLIS (NEUROSURGERY SUPPLIES) ×2 IMPLANT
SPONGE LAP 4X18 X RAY DECT (DISPOSABLE) ×2 IMPLANT
SPONGE SURGIFOAM ABS GEL SZ50 (HEMOSTASIS) IMPLANT
STAPLER SKIN PROX WIDE 3.9 (STAPLE) ×4 IMPLANT
STIMULATOR WIRELESS 74X79X20MM (MISCELLANEOUS) ×2 IMPLANT
STRIP CLOSURE SKIN 1/2X4 (GAUZE/BANDAGES/DRESSINGS) IMPLANT
SUT MNCRL AB 4-0 PS2 18 (SUTURE) IMPLANT
SUT SILK 0 (SUTURE) ×1
SUT SILK 0 MO-6 18XCR BRD 8 (SUTURE) ×1 IMPLANT
SUT SILK 0 TIES 10X30 (SUTURE) IMPLANT
SUT SILK 2 0 TIES 10X30 (SUTURE) IMPLANT
SUT VIC AB 2-0 CP2 18 (SUTURE) ×6 IMPLANT
SYR 10ML LL (SYRINGE) IMPLANT
SYR EPIDURAL 5ML GLASS (SYRINGE) ×2 IMPLANT
TOWEL GREEN STERILE (TOWEL DISPOSABLE) ×2 IMPLANT
TOWEL GREEN STERILE FF (TOWEL DISPOSABLE) ×2 IMPLANT
WATER STERILE IRR 1000ML POUR (IV SOLUTION) ×2 IMPLANT
YANKAUER SUCT BULB TIP NO VENT (SUCTIONS) ×2 IMPLANT

## 2016-06-21 NOTE — Discharge Instructions (Signed)
Dr. Alden Bensinger Post-Op Orders ° °• Ice Pack - 20 minutes on (in a pillow case), and 20 minutes off. Wear the ice pack UNDER the binder. °• Follow up in office, they will call you for an appointment in 10 days to 2 weeks. °• Increase activity gradually.   °• No lifting anything heavier than a gallon of milk (10 pounds) until seen in the office. °• Advance diet slowly as tolerated. °• Dressing care:  Keep dressing dry for 3 days, and on Post-op day 4, may shower. °• Call for fever, drainage, and redness. °• No swimming or bathing in a bathtub (do not get into standing water). °•  °

## 2016-06-21 NOTE — Transfer of Care (Signed)
Immediate Anesthesia Transfer of Care Note  Patient: Mary Cruz  Procedure(s) Performed: Procedure(s) with comments: LUMBAR SPINAL CORD STIMULATOR INSERTION (N/A) - [place]  Patient Location: PACU  Anesthesia Type:MAC  Level of Consciousness: awake, alert  and oriented  Airway & Oxygen Therapy: Patient Spontanous Breathing  Post-op Assessment: Report given to RN and Post -op Vital signs reviewed and stable  Post vital signs: Reviewed and stable  Last Vitals:  Vitals:   06/21/16 0632  BP: (!) 163/77  Pulse: 82  Resp: 20  Temp: 36.8 C    Last Pain:  Vitals:   06/21/16 0632  TempSrc: Oral      Patients Stated Pain Goal: 4 (85/02/77 4128)  Complications: No apparent anesthesia complications

## 2016-06-21 NOTE — Op Note (Signed)
1) lumbar pain 2) chronic pain  POSTOP DX: same as preop PROCEDURES PERFORMED:1)replacement of 2 [employer] SCS leads, new serial numbers [id number] 2) IPG replacement, SN [id number] SURGEON:Javontay Vandam  ASSISTANT: NONE  ANESTHESIA: MAC/TIVA EBL: <10cc  DESCRIPTION OF PROCEDURE: After a discussion of risks, benefits and alternatives, informed consent was obtained. The patient was taken to the OR, general anesthesia induced by the anesthesia team without difficulty, turned prone onto a Jackson table, all pressure points padded, SCD's placed. A timeout was taken to verify the correct patient, position, personnel, availability of appropriate equipment, and administration of perioperative antibiotics.  The thoracic and lumbar areas were widely prepped with chloraprep and draped into a sterile field. The skin and subcutaneous tissues around the patient's previous pocket incision was infiltrated with 0.25% bupivicaine 1:200K epinephrine. The subcutaneous pocket was incised with a 10 blade and using sharp, careful dissection the pocket opened and the IPG delivered onto the field. The pocket was inspected for hemostasis, which was found to be excellent. The leads were removed from the IPG. Leads were then isolated through the original lead placement incision, and with careful placement of angiocatheter access over the leads into the epidural space, the old leads were removed, and new MRI compatible leads placed at the original lead positions.   Pulse parameters and pulse width were changed with the patient reporting some improvement in comfort. With good coverage and comfort levels obtained, the leads were tunneled to the old pocket site, and a new MRI compatible IPG placed inline  The  Incisions were copiously irrigated with bacitracin-containing irrigation.The pocket incision was closed with a deeper layer of 2-0 vicryl interrupted sutures, and the skin closed with staples. The lead incision  was closed with 2 layers of 2-0 interrupted vicryl and the skin closed with staples. Sterile dressings were applied. Needle, sponge, and instrument counts were correct x2 at the end of the case.  The patient was then carefully awakened from anesthesia, turned supine, an abdominal binder placed, and the patient taken to the recovery room. COMPLICATIONS: NONE  CONDITION: Stable throughout the course of the procedure and immediately afterward  DISPOSITION: discharge to home. Discussed care with the patient and spouse Followup in clinic will be scheduled in 10-14 days.

## 2016-06-21 NOTE — Anesthesia Preprocedure Evaluation (Signed)
Anesthesia Evaluation  Patient identified by MRN, date of birth, ID band Patient awake    Reviewed: Allergy & Precautions, NPO status , Patient's Chart, lab work & pertinent test results  Airway Mallampati: I  TM Distance: >3 FB Neck ROM: Full    Dental   Pulmonary former smoker,    Pulmonary exam normal        Cardiovascular hypertension, Pt. on medications Normal cardiovascular exam     Neuro/Psych Anxiety    GI/Hepatic GERD  Medicated and Controlled,  Endo/Other    Renal/GU      Musculoskeletal   Abdominal   Peds  Hematology   Anesthesia Other Findings   Reproductive/Obstetrics                             Anesthesia Physical Anesthesia Plan  ASA: II  Anesthesia Plan: MAC   Post-op Pain Management:    Induction: Intravenous  Airway Management Planned: Simple Face Mask  Additional Equipment:   Intra-op Plan:   Post-operative Plan:   Informed Consent: I have reviewed the patients History and Physical, chart, labs and discussed the procedure including the risks, benefits and alternatives for the proposed anesthesia with the patient or authorized representative who has indicated his/her understanding and acceptance.     Plan Discussed with: CRNA and Surgeon  Anesthesia Plan Comments:         Anesthesia Quick Evaluation

## 2016-06-21 NOTE — Anesthesia Postprocedure Evaluation (Signed)
Anesthesia Post Note  Patient: Mary Cruz  Procedure(s) Performed: Procedure(s) (LRB): LUMBAR SPINAL CORD STIMULATOR INSERTION (N/A)  Patient location during evaluation: PACU Anesthesia Type: MAC Level of consciousness: awake and alert Pain management: pain level controlled Vital Signs Assessment: post-procedure vital signs reviewed and stable Respiratory status: spontaneous breathing, nonlabored ventilation, respiratory function stable and patient connected to nasal cannula oxygen Cardiovascular status: blood pressure returned to baseline and stable Postop Assessment: no signs of nausea or vomiting Anesthetic complications: no       Last Vitals:  Vitals:   06/21/16 1121 06/21/16 1122  BP:  (!) 124/59  Pulse:  86  Resp:  13  Temp: 36.5 C 36.5 C    Last Pain:  Vitals:   06/21/16 1030  TempSrc:   PainSc: [place] DAVID

## 2016-06-24 ENCOUNTER — Telehealth: Payer: Self-pay | Admitting: *Deleted

## 2016-06-25 ENCOUNTER — Encounter (HOSPITAL_COMMUNITY): Payer: Self-pay | Admitting: Anesthesiology

## 2016-07-11 ENCOUNTER — Other Ambulatory Visit: Payer: Self-pay | Admitting: Sports Medicine

## 2016-07-14 ENCOUNTER — Encounter: Payer: Self-pay | Admitting: Emergency Medicine

## 2016-07-14 ENCOUNTER — Emergency Department (INDEPENDENT_AMBULATORY_CARE_PROVIDER_SITE_OTHER): Payer: BLUE CROSS/BLUE SHIELD

## 2016-07-14 ENCOUNTER — Emergency Department
Admission: EM | Admit: 2016-07-14 | Discharge: 2016-07-14 | Disposition: A | Discharge status: Home / Self Care | Payer: BLUE CROSS/BLUE SHIELD | Source: Home / Self Care | Attending: Family Medicine | Admitting: Family Medicine

## 2016-07-14 DIAGNOSIS — R319 Hematuria, unspecified: Secondary | ICD-10-CM | POA: Diagnosis not present

## 2016-07-14 DIAGNOSIS — K5900 Constipation, unspecified: Secondary | ICD-10-CM

## 2016-07-14 DIAGNOSIS — R350 Frequency of micturition: Secondary | ICD-10-CM

## 2016-07-14 DIAGNOSIS — R300 Dysuria: Secondary | ICD-10-CM

## 2016-07-14 DIAGNOSIS — R109 Unspecified abdominal pain: Secondary | ICD-10-CM

## 2016-07-14 DIAGNOSIS — R102 Pelvic and perineal pain: Secondary | ICD-10-CM

## 2016-07-14 LAB — POCT URINALYSIS DIP (MANUAL ENTRY)
Bilirubin, UA: NEGATIVE
Glucose, UA: NEGATIVE mg/dL
Ketones, POC UA: NEGATIVE mg/dL
Leukocytes, UA: NEGATIVE
Nitrite, UA: NEGATIVE
Protein Ur, POC: 30 mg/dL — AB
Spec Grav, UA: >=1.03 — AB (ref 1.010–1.025)
Urobilinogen, UA: 0.2 E.U./dL
pH, UA: 5 (ref 5.0–8.0)

## 2016-07-14 MED ORDER — PHENAZOPYRIDINE HCL 200 MG PO TABS: 200.0000 mg | ORAL_TABLET | Freq: Three times a day (TID) | ORAL | 0 refills | Status: DC

## 2016-07-14 MED ORDER — TAMSULOSIN HCL 0.4 MG PO CAPS: 0.4000 mg | ORAL_CAPSULE | Freq: Every day | ORAL | 0 refills | Status: DC

## 2016-07-14 NOTE — ED Provider Notes (Signed)
CSN: [id number]     Arrival date & time 07/14/16  1705 History   First MD Initiated Contact with Patient 07/14/16 1732     Chief Complaint  Patient presents with  . Abdominal Pain   (Consider location/radiation/quality/duration/timing/severity/associated sxs/prior Treatment) HPI  Mary Cruz is a 41 y.o. female presenting to UC with c/o lower abdominal pain and pressure for about 3 days with associated dysuria, urinary frequency and sensation of needing to strain to urinate.  She has hx of cystitis and possible kidney stone in the past. Denies flank pain. Denies fever, chills, n/v/d. She has tried to increased fluid intake but no relief.    Past Medical History:  Diagnosis Date  . Abscess of axillary fold 10/10/2013  . Acute bronchitis with asthma with acute exacerbation 01/23/2015  . Fibromyalgia   . Hypertension   . Lupus   . Osteoarthritis   . Pain    chronic regional pain syndrome  . Sepsis affecting skin ([place]) 10/10/2013   Past Surgical History:  Procedure Laterality Date  . BREAST SURGERY    . CHOLECYSTECTOMY  2000  . ENDOMETRIAL ABLATION    . KNEE SURGERY    . KNEE SURGERY Right 02-2015  . OCCIPITAL NERVE STIMULATOR INSERTION    . SHOULDER SURGERY    . spinal cord stimulator    . SPINAL CORD STIMULATOR IMPLANT  2008  . SPINAL CORD STIMULATOR INSERTION N/A 06/21/2016   Procedure: LUMBAR SPINAL CORD STIMULATOR INSERTION;  Surgeon: Clydell Hakim, MD;  Location: [facility];  Service: Neurosurgery;  Laterality: N/A;  LUMBAR SPINAL CORD STIMULATOR INSERTION  . TUBAL LIGATION  2000   Family History  Problem Relation Age of Onset  . Depression Mother        maternal grandmother  . Asthma Mother   . Fibromyalgia Unknown        aunt  . Stroke Unknown        grandmother  . Thyroid disease Unknown        grandmother   Social History  Substance Use Topics  . Smoking status: Former Smoker    Quit date: 08/01/2015  . Smokeless tobacco: Never Used  . Alcohol use No   OB  History    No data available     Review of Systems  Constitutional: Negative for chills and fever.  Gastrointestinal: Positive for abdominal pain. Negative for diarrhea, nausea and vomiting.  Genitourinary: Positive for decreased urine volume, dysuria, frequency, hematuria, pelvic pain and urgency. Negative for flank pain, vaginal bleeding, vaginal discharge and vaginal pain.  Musculoskeletal: Negative for back pain and myalgias.    Allergies  Keflex [cephalexin]; Amoxicillin; Celebrex [celecoxib]; Pentazocine lactate; Bactrim [sulfamethoxazole-trimethoprim]; Doxycycline; Erythromycin; Hydromorphone hcl; Latex; and Nabumetone  Home Medications   Prior to Admission medications   Medication Sig Start Date End Date Taking? Authorizing Provider  albuterol (PROVENTIL HFA;VENTOLIN HFA) 108 (90 Base) MCG/ACT inhaler Inhale 1-2 puffs into the lungs every 6 (six) hours as needed for wheezing or shortness of breath. 03/04/16   Fransico Meadow, PA-C  amitriptyline (ELAVIL) 100 MG tablet TAKE 1 TABLET (100 MG TOTAL) BY MOUTH AT BEDTIME. 03/27/16   Silverio Decamp, MD  Cholecalciferol (VITAMIN D3) 2000 units TABS Take 1 tablet by mouth daily.    [provider]  DULoxetine (CYMBALTA) 60 MG capsule Take 1 capsule (60 mg total) by mouth 2 (two) times daily. 09/11/15   Marcial Pacas, DO  hydroxychloroquine (PLAQUENIL) 200 MG tablet Take 200 mg by mouth 2 (  two) times daily.  11/28/15   [provider]  lisinopril-hydrochlorothiazide (PRINZIDE,ZESTORETIC) 10-12.5 MG tablet Take 0.5 tablets by mouth daily. 04/02/16   Silverio Decamp, MD  metaxalone (SKELAXIN) 800 MG tablet Take 1 tablet (800 mg total) by mouth 3 (three) times daily. 05/29/16 08/27/16  Milinda Pointer, MD  naproxen (NAPROSYN) 500 MG tablet Take 1 tablet (500 mg total) by mouth 2 (two) times daily. 06/16/16   Evalee Jefferson, PA-C  omeprazole (PRILOSEC) 40 MG capsule TAKE 1 CAPSULE (40 MG TOTAL) BY MOUTH 2 (TWO) TIMES  DAILY. 07/11/16   Silverio Decamp, MD  oxyCODONE (OXY IR/ROXICODONE) 5 MG immediate release tablet Take 1 tablet (5 mg total) by mouth every 4 (four) hours as needed for severe pain. 06/21/16 07/21/16  Clydell Hakim, MD  phenazopyridine (PYRIDIUM) 200 MG tablet Take 1 tablet (200 mg total) by mouth 3 (three) times daily. 07/14/16   Noland Fordyce, PA-C  pregabalin (LYRICA) 300 MG capsule Take 600 mg by mouth at bedtime.    [provider]  tamsulosin (FLOMAX) 0.4 MG CAPS capsule Take 1 capsule (0.4 mg total) by mouth daily. 07/14/16   Noland Fordyce, PA-C   Meds Ordered and Administered this Visit  Medications - No data to display  BP 139/83 (BP Location: Left Arm)   Pulse (!) 102   Temp 97.8 F (36.6 C) (Oral)   Ht 5\' 4"  (1.626 m)   Wt 256 lb (116.1 kg)   LMP  (LMP Unknown)   SpO2 97%   BMI 43.94 kg/m  No data found.   Physical Exam  Constitutional: She is oriented to person, place, and time. She appears well-developed and well-nourished. No distress.  HENT:  Head: Normocephalic and atraumatic.  Mouth/Throat: Oropharynx is clear and moist.  Eyes: EOM are normal.  Neck: Normal range of motion. Neck supple.  Cardiovascular: Normal rate and regular rhythm.   Pulmonary/Chest: Effort normal and breath sounds normal. No respiratory distress. She has no wheezes. She has no rales.  Abdominal: Soft. She exhibits no distension and no mass. There is tenderness (suprapubic). There is no rebound, no guarding and no CVA tenderness.  Musculoskeletal: Normal range of motion.  Neurological: She is alert and oriented to person, place, and time.  Skin: Skin is warm and dry. She is not diaphoretic.  Psychiatric: She has a normal mood and affect. Her behavior is normal.  Nursing note and vitals reviewed.   Urgent Care Course     Procedures (including critical care time)  Labs Review Labs Reviewed  POCT URINALYSIS DIP (MANUAL ENTRY) - Abnormal; Notable for the following:        Result Value   Spec Grav, UA >=1.030 (*)    Blood, UA large (*)    Protein Ur, POC =30 (*)    All other components within normal limits  URINE CULTURE  POCT URINE PREGNANCY    Imaging Review Dg Abd 1 View  Result Date: 07/14/2016 CLINICAL DATA:  Patient with bilateral flank pain and urinary frequency. Prior cholecystectomy. Spinal stimulator. EXAM: ABDOMEN - 1 VIEW COMPARISON:  CT abdomen pelvis 11/24/2015. FINDINGS: Cholecystectomy clips. Large amount of stool within the colon, particularly within the cecum and ascending colon. Paucity of small bowel gas limits evaluation. Osseous structures are unremarkable. Multiple rounded radiodensities within the pelvis. IMPRESSION: Paucity of small bowel gas. Large amount of stool throughout the colon, particularly the cecum and ascending colon as can be seen with constipation. Multiple rounded radiodensities projecting over the pelvis felt to  represent phleboliths. A distal ureteral stone would be difficult to exclude given the multiple radiodensities. Electronically Signed   By: Lovey Newcomer M.D.   On: 07/14/2016 17:58     MDM   1. Hematuria   2. Pelvic pain   3. Constipation, unspecified constipation type   4. Dysuria    Hx and exam most c/w with a ureteral stone. Pain also likely due to constipation.  Pt notes she has been "working on that" Will treat symptomatically.  Strainer given to pt  Rx: Pyridium and Flomax  Urine culture sent to lab  Encouraged continued increased fluids. F/u with PCP later this week if not improving, sooner if worsening.     Noland Fordyce, PA-C 07/14/16 1821

## 2016-07-14 NOTE — ED Triage Notes (Signed)
Patient presents to [facility] with complaint of abdominal pain x 3 days. Patient reports that she is having urgency and frequency but does not put out a lot of urine.

## 2016-07-15 LAB — URINE CULTURE

## 2016-07-16 ENCOUNTER — Telehealth: Payer: Self-pay

## 2016-07-16 NOTE — Telephone Encounter (Signed)
Has passed 3-4 kidney stones, stated that they look like grains of sand..  Feeling better.

## 2016-07-22 ENCOUNTER — Ambulatory Visit (INDEPENDENT_AMBULATORY_CARE_PROVIDER_SITE_OTHER): Payer: BLUE CROSS/BLUE SHIELD | Admitting: Family Medicine

## 2016-07-22 ENCOUNTER — Ambulatory Visit (HOSPITAL_BASED_OUTPATIENT_CLINIC_OR_DEPARTMENT_OTHER)
Admission: RE | Admit: 2016-07-22 | Discharge: 2016-07-22 | Disposition: A | Discharge status: Home / Self Care | Payer: BLUE CROSS/BLUE SHIELD | Source: Ambulatory Visit | Attending: Family Medicine | Admitting: Family Medicine

## 2016-07-22 ENCOUNTER — Encounter: Payer: Self-pay | Admitting: Family Medicine

## 2016-07-22 VITALS — BP 123/73 | HR 111 | Temp 98.3°F | Wt 261.0 lb

## 2016-07-22 DIAGNOSIS — Z87442 Personal history of urinary calculi: Secondary | ICD-10-CM

## 2016-07-22 DIAGNOSIS — R300 Dysuria: Secondary | ICD-10-CM | POA: Diagnosis not present

## 2016-07-22 DIAGNOSIS — N202 Calculus of kidney with calculus of ureter: Secondary | ICD-10-CM | POA: Diagnosis not present

## 2016-07-22 DIAGNOSIS — R768 Other specified abnormal immunological findings in serum: Secondary | ICD-10-CM

## 2016-07-22 LAB — POCT URINALYSIS DIPSTICK
BILIRUBIN UA: NEGATIVE
GLUCOSE UA: NEGATIVE
LEUKOCYTES UA: NEGATIVE
NITRITE UA: NEGATIVE
Protein, UA: NEGATIVE
Spec Grav, UA: 1.025 (ref 1.010–1.025)
Urobilinogen, UA: 0.2 E.U./dL
pH, UA: 5.5 (ref 5.0–8.0)

## 2016-07-22 MED ORDER — PHENAZOPYRIDINE HCL 200 MG PO TABS: 200.0000 mg | ORAL_TABLET | Freq: Three times a day (TID) | ORAL | 0 refills | Status: DC

## 2016-07-22 MED ORDER — TAMSULOSIN HCL 0.4 MG PO CAPS: 0.4000 mg | ORAL_CAPSULE | Freq: Every day | ORAL | 1 refills | Status: DC

## 2016-07-22 NOTE — Patient Instructions (Signed)
Thank you for coming in today. Continue the pyridium Continue flomax.  We will arrange for a CT scan ASAP.  We will contact you with results.  I recommend you establish care with Dr Sheppard Coil in a few weeks to follow up.    Kidney Stones Kidney stones (urolithiasis) are solid, rock-like deposits that form inside of the organs that make urine (kidneys). A kidney stone may form in a kidney and move into the bladder, where it can cause intense pain and block the flow of urine. Kidney stones are created when high levels of certain minerals are found in the urine. They are usually passed through urination, but in some cases, medical treatment may be needed to remove them. What are the causes? Kidney stones may be caused by:  A condition in which certain glands produce too much parathyroid hormone (primary hyperparathyroidism), which causes too much calcium buildup in the blood.  Buildup of uric acid crystals in the bladder (hyperuricosuria). Uric acid is a chemical that the body produces when you eat certain foods. It usually exits the body in the urine.  Narrowing (stricture) of one or both of the tubes that drain urine from the kidneys to the bladder (ureters).  A kidney blockage that is present at birth (congenital obstruction).  Past surgery on the kidney or the ureters, such as gastric bypass surgery.  What increases the risk? The following factors make you more likely to develop kidney stones:  Having had a kidney stone in the past.  Having a family history of kidney stones.  Not drinking enough water.  Eating a diet that is high in protein, salt (sodium), or sugar.  Being overweight or obese.  What are the signs or symptoms? Symptoms of a kidney stone may include:  Nausea.  Vomiting.  Blood in the urine (hematuria).  Pain in the side of the abdomen, right below the ribs (flank pain). Pain usually spreads (radiates) to the groin.  Needing to urinate frequently or  urgently.  How is this diagnosed? This condition may be diagnosed based on:  Your medical history.  A physical exam.  Blood tests.  Urine tests.  CT scan.  Abdominal X-ray.  A procedure to examine the inside of the bladder (cystoscopy).  How is this treated? Treatment for kidney stones depends on the size, location, and makeup of the stones. Treatment may involve:  Analyzing your urine before and after you pass the stone through urination.  Being monitored at the hospital until you pass the stone through urination.  Increasing your fluid intake and decreasing the amount of calcium and protein in your diet.  A procedure to break up kidney stones in the bladder using: ? A focused beam of light (laser therapy). ? Shock waves (extracorporeal shock wave lithotripsy).  Surgery to remove kidney stones. This may be needed if you have severe pain or have stones that block your urinary tract.  Follow these instructions at home: Eating and drinking   Drink enough fluid to keep your urine clear or pale yellow. This will help you to pass the kidney stone.  If directed, change your diet. This may include: ? Limiting how much sodium you eat. ? Eating more fruits and vegetables. ? Limiting how much meat, poultry, fish, and eggs you eat.  Follow instructions from your health care provider about eating or drinking restrictions. General instructions  Collect urine samples as told by your health care provider. You may need to collect a urine sample: ? 24 hours  after you pass the stone. ? 8-12 weeks after passing the kidney stone, and every 6-12 months after that.  Strain your urine every time you urinate, for as long as directed. Use the strainer that your health care provider recommends.  Do not throw out the kidney stone after passing it. Keep the stone so it can be tested by your health care provider. Testing the makeup of your kidney stone may help prevent you from getting  kidney stones in the future.  Take over-the-counter and prescription medicines only as told by your health care provider.  Keep all follow-up visits as told by your health care provider. This is important. You may need follow-up X-rays or ultrasounds to make sure that your stone has passed. How is this prevented? To prevent another kidney stone:  Drink enough fluid to keep your urine clear or pale yellow. This is the best way to prevent kidney stones.  Eat a healthy diet and follow recommendations from your health care provider about foods to avoid. You may be instructed to eat a low-protein diet. Recommendations vary depending on the type of kidney stone that you have.  Maintain a healthy weight.  Contact a health care provider if:  You have pain that gets worse or does not get better with medicine. Get help right away if:  You have a fever or chills.  You develop severe pain.  You develop new abdominal pain.  You faint.  You are unable to urinate. This information is not intended to replace advice given to you by your health care provider. Make sure you discuss any questions you have with your health care provider. Document Released: 01/28/2005 Document Revised: 08/18/2015 Document Reviewed: 07/14/2015 Elsevier Interactive Patient Education  2017 [employer].

## 2016-07-22 NOTE — Progress Notes (Signed)
Mary Cruz is a 41 y.o. female who presents to [facility]: [place] today for abdominal pain. Patient has a several day history of recurrent intermittent right-sided flank and abdominal pain. She was seen in urgent care on June 3 were she was found to have hematuria and thought to have kidney stones. She notes her symptoms are consistent with previous episodes of kidney stones. She was prescribed Flomax and Pyridium and given a urine strainer. She's passed 3 small grains of sand looking potential kidney stones. She notes she continues to experience intermittent severe colicky abdominal pain. She denies fevers or chills or vomiting. She notes urinary frequency urgency and dysuria as well. Her past surgical history is most recently significant for a lumbar spinal cord stimulator change on May 11. She has not had problems with urinary retention until recently.  Additionally she notes that she is unhappy with her rheumatologist and wishes to switch.   Past Medical History:  Diagnosis Date  . Abscess of axillary fold 10/10/2013  . Acute bronchitis with asthma with acute exacerbation 01/23/2015  . Fibromyalgia   . Hypertension   . Lupus   . Osteoarthritis   . Pain    chronic regional pain syndrome  . Sepsis affecting skin ([place]) 10/10/2013   Past Surgical History:  Procedure Laterality Date  . BREAST SURGERY    . CHOLECYSTECTOMY  2000  . ENDOMETRIAL ABLATION    . KNEE SURGERY    . KNEE SURGERY Right 02-2015  . OCCIPITAL NERVE STIMULATOR INSERTION    . SHOULDER SURGERY    . spinal cord stimulator    . SPINAL CORD STIMULATOR IMPLANT  2008  . SPINAL CORD STIMULATOR INSERTION N/A 06/21/2016   Procedure: LUMBAR SPINAL CORD STIMULATOR INSERTION;  Surgeon: Clydell Hakim, MD;  Location: [facility];  Service: Neurosurgery;  Laterality: N/A;  LUMBAR SPINAL CORD STIMULATOR INSERTION  . TUBAL  LIGATION  2000   Social History  Substance Use Topics  . Smoking status: Former Smoker    Quit date: 08/01/2015  . Smokeless tobacco: Never Used  . Alcohol use No   family history includes Asthma in her mother; Depression in her mother.  ROS as above:  Medications: Current Outpatient Prescriptions  Medication Sig Dispense Refill  . albuterol (PROVENTIL HFA;VENTOLIN HFA) 108 (90 Base) MCG/ACT inhaler Inhale 1-2 puffs into the lungs every 6 (six) hours as needed for wheezing or shortness of breath. 1 Inhaler 0  . amitriptyline (ELAVIL) 100 MG tablet TAKE 1 TABLET (100 MG TOTAL) BY MOUTH AT BEDTIME. 90 tablet 1  . Cholecalciferol (VITAMIN D3) 2000 units TABS Take 1 tablet by mouth daily.    . DULoxetine (CYMBALTA) 60 MG capsule Take 1 capsule (60 mg total) by mouth 2 (two) times daily. 180 capsule 1  . hydroxychloroquine (PLAQUENIL) 200 MG tablet Take 200 mg by mouth 2 (two) times daily.     [name] lisinopril-hydrochlorothiazide (PRINZIDE,ZESTORETIC) 10-12.5 MG tablet Take 0.5 tablets by mouth daily. 45 tablet 1  . metaxalone (SKELAXIN) 800 MG tablet Take 1 tablet (800 mg total) by mouth 3 (three) times daily. 270 tablet 0  . naproxen (NAPROSYN) 500 MG tablet Take 1 tablet (500 mg total) by mouth 2 (two) times daily. 30 tablet 0  . omeprazole (PRILOSEC) 40 MG capsule TAKE 1 CAPSULE (40 MG TOTAL) BY MOUTH 2 (TWO) TIMES DAILY. 180 capsule 0  . oxyCODONE (OXY IR/ROXICODONE) 5 MG immediate release tablet Take 1 tablet (5  mg total) by mouth every 4 (four) hours as needed for severe pain. 120 tablet 0  . phenazopyridine (PYRIDIUM) 200 MG tablet Take 1 tablet (200 mg total) by mouth 3 (three) times daily. 60 tablet 0  . pregabalin (LYRICA) 300 MG capsule Take 600 mg by mouth at bedtime.    . tamsulosin (FLOMAX) 0.4 MG CAPS capsule Take 1 capsule (0.4 mg total) by mouth daily. 30 capsule 1   No current facility-administered medications for this visit.    Allergies  Allergen Reactions  . Keflex  [Cephalexin] Other (See Comments)    Nausea and causes uti's  . Amoxicillin Diarrhea and Nausea And Vomiting  . Celebrex [Celecoxib] Swelling    "Body swelling"  . Pentazocine Lactate Itching and Other (See Comments)    HALLUCINATIONS  . Bactrim [Sulfamethoxazole-Trimethoprim] Other (See Comments)    "Makes very sick"  . Doxycycline     UNSPECIFIED REACTION  "SICKNESS"  . Erythromycin Other (See Comments)    RESULTANT UTI  . Hydromorphone Hcl Other (See Comments)    HALLUCINATIONS  . Latex Itching  . Nabumetone Itching    Health Maintenance Health Maintenance  Topic Date Due  . HIV Screening  11/08/1990  . TETANUS/TDAP  11/08/1994  . PAP SMEAR  11/07/1996  . INFLUENZA VACCINE  09/11/2016     Exam:  BP 123/73   Pulse (!) 111   Temp 98.3 F (36.8 C) (Oral)   Wt 261 lb (118.4 kg)   LMP  (LMP Unknown) Comment: pregancy test performed  SpO2 100%   BMI 44.80 kg/m  Gen: Well NAD HEENT: EOMI,  MMM Lungs: Normal work of breathing. CTABL Heart: Mild tachycardia no MRG Abd: NABS, Soft. Nondistended, Nontender no CVA angle tenderness to percussion Exts: Brisk capillary refill, warm and well perfused.    Results for orders placed or performed in visit on 07/22/16 (from the past 72 hour(s))  POCT Urinalysis Dipstick     Status: None   Collection Time: 07/22/16  3:59 PM  Result Value Ref Range   Color, UA yellow    Clarity, UA clear    Glucose, UA neg    Bilirubin, UA neg    Ketones, UA trace    Spec Grav, UA 1.025 1.010 - 1.025   Blood, UA trace-intact    pH, UA 5.5 5.0 - 8.0   Protein, UA neg    Urobilinogen, UA 0.2 0.2 or 1.0 E.U./dL   Nitrite, UA neg    Leukocytes, UA Negative Negative   No results found.    Assessment and Plan: 41 y.o. female with  Flank pain with hematuria concerning for kidney stones. Landed obtain a CT renal stone study tonight as well as basic labs are listed below. We'll continue Flomax and Pyridium.  Additionally patient requests  referral to a different rheumatologist. We have done so.   Orders Placed This Encounter  Procedures  . CT RENAL STONE STUDY    Standing Status:   Future    Standing Expiration Date:   10/22/2017    Order Specific Question:   Reason for Exam (SYMPTOM  OR DIAGNOSIS REQUIRED)    Answer:   eval poss rt kidney stone    Order Specific Question:   Is patient pregnant?    Answer:   No    Order Specific Question:   Preferred imaging location?    Answer:   [facility] Specific Question:   Call Results- [facility] Contact Number?  Answer:   [phone]    Order Specific Question:   Radiology Contrast Protocol - do NOT remove file path    Answer:   [email or web address]  . CBC  . COMPLETE METABOLIC PANEL WITH GFR  . Ambulatory referral to Rheumatology    Referral Priority:   Routine    Referral Type:   Consultation    Referral Reason:   Specialty Services Required    Requested Specialty:   Rheumatology    Number of Visits Requested:   1  . POCT Urinalysis Dipstick   Meds ordered this encounter  Medications  . phenazopyridine (PYRIDIUM) 200 MG tablet    Sig: Take 1 tablet (200 mg total) by mouth 3 (three) times daily.    Dispense:  60 tablet    Refill:  0  . tamsulosin (FLOMAX) 0.4 MG CAPS capsule    Sig: Take 1 capsule (0.4 mg total) by mouth daily.    Dispense:  30 capsule    Refill:  1     Discussed warning signs or symptoms. Please see discharge instructions. Patient expresses understanding. I spent 40 minutes with this patient, greater than 50% was face-to-face time counseling regarding the above diagnosis.

## 2016-07-23 LAB — COMPLETE METABOLIC PANEL WITH GFR
ALT: 20 U/L (ref 6–29)
AST: 15 U/L (ref 10–30)
Albumin: 3.8 g/dL (ref 3.6–5.1)
Alkaline Phosphatase: 96 U/L (ref 33–115)
BUN: 10 mg/dL (ref 7–25)
CHLORIDE: 104 mmol/L (ref 98–110)
CO2: 25 mmol/L (ref 20–31)
CREATININE: 1.01 mg/dL (ref 0.50–1.10)
Calcium: 8.9 mg/dL (ref 8.6–10.2)
GFR, EST AFRICAN AMERICAN: 80 mL/min (ref >=60)
GFR, EST NON AFRICAN AMERICAN: 70 mL/min (ref >=60)
Glucose, Bld: 92 mg/dL (ref 65–99)
Potassium: 4 mmol/L (ref 3.5–5.3)
Sodium: 139 mmol/L (ref 135–146)
Total Bilirubin: 0.3 mg/dL (ref 0.2–1.2)
Total Protein: 6.9 g/dL (ref 6.1–8.1)

## 2016-07-23 LAB — CBC
HCT: 36.7 % (ref 35.0–45.0)
Hemoglobin: 11.5 g/dL — ABNORMAL LOW (ref 11.7–15.5)
MCH: 27.4 pg (ref 27.0–33.0)
MCHC: 31.3 g/dL — ABNORMAL LOW (ref 32.0–36.0)
MCV: 87.4 fL (ref 80.0–100.0)
MPV: 10.9 fL (ref 7.5–12.5)
PLATELETS: 262 10*3/uL (ref 140–400)
RBC: 4.2 MIL/uL (ref 3.80–5.10)
RDW: 15.2 % — AB (ref 11.0–15.0)
WBC: 7.8 10*3/uL (ref 3.8–10.8)

## 2016-07-24 LAB — URINE CULTURE: Organism ID, Bacteria: NO GROWTH

## 2016-08-05 ENCOUNTER — Ambulatory Visit: Payer: BLUE CROSS/BLUE SHIELD | Admitting: Osteopathic Medicine

## 2016-08-08 ENCOUNTER — Ambulatory Visit: Payer: BLUE CROSS/BLUE SHIELD | Admitting: Osteopathic Medicine

## 2016-08-12 ENCOUNTER — Telehealth: Payer: Self-pay | Admitting: *Deleted

## 2016-08-12 ENCOUNTER — Ambulatory Visit: Payer: BLUE CROSS/BLUE SHIELD | Admitting: Nurse Practitioner

## 2016-08-19 ENCOUNTER — Ambulatory Visit: Payer: BLUE CROSS/BLUE SHIELD | Attending: Nurse Practitioner | Admitting: Nurse Practitioner

## 2016-08-19 ENCOUNTER — Encounter: Payer: Self-pay | Admitting: Nurse Practitioner

## 2016-08-19 VITALS — BP 161/90 | HR 92 | Temp 98.1°F | Resp 18 | Ht 64.0 in | Wt 251.0 lb

## 2016-08-19 DIAGNOSIS — M797 Fibromyalgia: Secondary | ICD-10-CM | POA: Insufficient documentation

## 2016-08-19 DIAGNOSIS — K589 Irritable bowel syndrome without diarrhea: Secondary | ICD-10-CM | POA: Insufficient documentation

## 2016-08-19 DIAGNOSIS — M79602 Pain in left arm: Secondary | ICD-10-CM | POA: Diagnosis not present

## 2016-08-19 DIAGNOSIS — K219 Gastro-esophageal reflux disease without esophagitis: Secondary | ICD-10-CM | POA: Diagnosis not present

## 2016-08-19 DIAGNOSIS — F329 Major depressive disorder, single episode, unspecified: Secondary | ICD-10-CM | POA: Insufficient documentation

## 2016-08-19 DIAGNOSIS — R252 Cramp and spasm: Secondary | ICD-10-CM

## 2016-08-19 DIAGNOSIS — M79601 Pain in right arm: Secondary | ICD-10-CM | POA: Diagnosis not present

## 2016-08-19 DIAGNOSIS — N202 Calculus of kidney with calculus of ureter: Secondary | ICD-10-CM | POA: Diagnosis not present

## 2016-08-19 DIAGNOSIS — J209 Acute bronchitis, unspecified: Secondary | ICD-10-CM | POA: Diagnosis not present

## 2016-08-19 DIAGNOSIS — Z79891 Long term (current) use of opiate analgesic: Secondary | ICD-10-CM

## 2016-08-19 DIAGNOSIS — M791 Myalgia: Secondary | ICD-10-CM

## 2016-08-19 DIAGNOSIS — J45901 Unspecified asthma with (acute) exacerbation: Secondary | ICD-10-CM | POA: Diagnosis not present

## 2016-08-19 DIAGNOSIS — F17200 Nicotine dependence, unspecified, uncomplicated: Secondary | ICD-10-CM | POA: Diagnosis not present

## 2016-08-19 DIAGNOSIS — I10 Essential (primary) hypertension: Secondary | ICD-10-CM | POA: Diagnosis not present

## 2016-08-19 DIAGNOSIS — E559 Vitamin D deficiency, unspecified: Secondary | ICD-10-CM | POA: Diagnosis not present

## 2016-08-19 DIAGNOSIS — E669 Obesity, unspecified: Secondary | ICD-10-CM | POA: Insufficient documentation

## 2016-08-19 DIAGNOSIS — M542 Cervicalgia: Secondary | ICD-10-CM

## 2016-08-19 DIAGNOSIS — M329 Systemic lupus erythematosus, unspecified: Secondary | ICD-10-CM | POA: Insufficient documentation

## 2016-08-19 DIAGNOSIS — G894 Chronic pain syndrome: Secondary | ICD-10-CM | POA: Diagnosis not present

## 2016-08-19 DIAGNOSIS — G8929 Other chronic pain: Secondary | ICD-10-CM

## 2016-08-19 DIAGNOSIS — M1990 Unspecified osteoarthritis, unspecified site: Secondary | ICD-10-CM | POA: Diagnosis not present

## 2016-08-19 DIAGNOSIS — Z9049 Acquired absence of other specified parts of digestive tract: Secondary | ICD-10-CM | POA: Diagnosis not present

## 2016-08-19 DIAGNOSIS — M25561 Pain in right knee: Secondary | ICD-10-CM | POA: Insufficient documentation

## 2016-08-19 DIAGNOSIS — M5412 Radiculopathy, cervical region: Secondary | ICD-10-CM | POA: Diagnosis not present

## 2016-08-19 DIAGNOSIS — F411 Generalized anxiety disorder: Secondary | ICD-10-CM | POA: Insufficient documentation

## 2016-08-19 DIAGNOSIS — M7918 Myalgia, other site: Secondary | ICD-10-CM

## 2016-08-19 MED ORDER — METAXALONE 800 MG PO TABS: 800.0000 mg | ORAL_TABLET | Freq: Three times a day (TID) | ORAL | 0 refills | Status: DC

## 2016-08-19 MED ORDER — OXYCODONE HCL 5 MG PO TABS: 5.0000 mg | ORAL_TABLET | Freq: Four times a day (QID) | ORAL | 0 refills | Status: DC | PRN

## 2016-08-19 NOTE — Progress Notes (Signed)
Patient's Name: Mary Cruz  MRN: [id number]  Referring Provider: Marcial Pacas, DO  DOB: 12-01-1975  PCP: System, Provider Not In  DOS: 08/19/2016  Note by: Vevelyn Francois NP  Service setting: Ambulatory outpatient  Specialty: Interventional Pain Management  Location: [facility] (AMB) Pain Management Facility    Patient type: Established    Primary Reason(s) for Visit: Encounter for prescription drug management. (Level of risk: moderate)  CC: Neck Pain  HPI  Ms. Kos is a 41 y.o. year old, female patient, who comes today for a medication management evaluation. She has GERD; Irritable bowel syndrome; KIDNEY STONES; Essential hypertension, benign; Chronic pain syndrome; Generalized anxiety disorder; Obesity; Long term current use of opiate analgesic; Long term prescription opiate use; Opiate use (30 MME/Day); Opiate dependence ([place]); Encounter for therapeutic drug level monitoring; Musculoskeletal pain; Muscle cramping; Osteoarthrosis; Vitamin D insufficiency; Presence of functional implant (Medtronic Lumbar spinal cord stimulator implant); Encounter for management of implanted device; Chronic low back pain (Bilateral) (R>L); Chronic lumbar radicular pain (Right L5 dermatome; Left S1 Dermatome) (Bilateral) (R>L); Chronic hip pain (Location of Tertiary source of pain) (Bilateral) (R>L); Chronic neck pain (Location of Primary Source of Pain) (Bilateral) (L>R); Chronic upper extremity pain (Location of Secondary source of pain) (Bilateral) (L>R); Morbid obesity ([place]); Nicotine dependence; Chronic cervical radicular pain (C7 Dermatome) (Bilateral) (L>R); Chronic lower extremity pain (Bilateral) (R>L); Right knee pain; Anxiety and depression; Implant pain at battery site (right buttocks area); Lumbar spondylosis; Lumbar facet syndrome (Bilateral) (R>L); Positive ANA (antinuclear antibody); Spinal cord stimulator status; and Battery end of life of spinal cord stimulator on her problem list. Her primarily concern  today is the Neck Pain  Pain Assessment: Location:     Radiating:   Onset:   Duration:   Quality:   Severity: 2 /10 (self-reported pain score)  Note: Reported level is compatible with observation.                   Effect on ADL:   Timing:   Modifying factors:    Ms. Runkel was last scheduled for an appointment on 05/16/16 for medication management. During today's appointment we reviewed Ms. Pinkhasov chronic pain status, as well as her outpatient medication regimen. She has chronic neck back pain. She has radicular symptoms that do down into her shoulders. She admits that the right is greater than the left. She has numbness tingling and weakness in her right hand. She admits the she had a injection at [place] last week into her right elbow. She admits that it has been effective in the elbow. She admits that she has not had a recent cervical x-ray.  The patient  reports that she does not use drugs. Her body mass index is 43.08 kg/m.  Further details on both, my assessment(s), as well as the proposed treatment plan, please see below.  Controlled Substance Pharmacotherapy Assessment REMS (Risk Evaluation and Mitigation Strategy)  Analgesic:Oxycodone IR 5 mg every 6 hours when necessary for pain. (20 mg/day) MME/day:30 mg/day.  Hart Rochester, RN  08/19/2016 11:31 AM  Sign at close encounter Nursing Pain Medication Assessment:  Safety precautions to be maintained throughout the outpatient stay will include: orient to surroundings, keep bed in low position, maintain call bell within reach at all times, provide assistance with transfer out of bed and ambulation.  Medication Inspection Compliance: Pill count conducted under aseptic conditions, in front of the patient. Neither the pills nor the bottle was removed from the patient's sight at any  time. Once count was completed pills were immediately returned to the patient in their original bottle.  Medication: Oxycodone  IR Pill/Patch Count: 10 of 120 pills remain Pill/Patch Appearance: Markings consistent with prescribed medication Bottle Appearance: Standard pharmacy container. Clearly labeled. Filled Date: 06 / 12 / 2018 Last Medication intake:  Yesterday   Pharmacokinetics: Liberation and absorption (onset of action): WNL Distribution (time to peak effect): WNL Metabolism and excretion (duration of action): WNL         Pharmacodynamics: Desired effects: Analgesia: Ms. Karam reports >50% benefit. Functional ability: Patient reports that medication allows her to accomplish basic ADLs Clinically meaningful improvement in function (CMIF): Sustained CMIF goals met Perceived effectiveness: Described as relatively effective, allowing for increase in activities of daily living (ADL) Undesirable effects: Side-effects or Adverse reactions: None reported Monitoring: [place] PMP: Online review of the past 17-monthperiod conducted. Compliant with practice rules and regulations List of all UDS test(s) done:  Lab Results  Component Value Date   TOXASSSELUR FINAL 06/29/2015   TOXASSSELUR FINAL 04/05/2015   TEl RioFINAL 12/19/2014   Last UDS on record: ToxAssure Select 13  Date Value Ref Range Status  06/29/2015 FINAL  Final    Comment:    ==================================================================== TOXASSURE SELECT 13 (MW) ==================================================================== Test                             Result       Flag       Units Drug Present and Declared for Prescription Verification   Oxycodone                      419          EXPECTED   ng/mg creat   Oxymorphone                    481          EXPECTED   ng/mg creat   Noroxycodone                   1119         EXPECTED   ng/mg creat   Noroxymorphone                 123          EXPECTED   ng/mg creat    Sources of oxycodone are scheduled prescription medications.    Oxymorphone, noroxycodone, and noroxymorphone  are expected    metabolites of oxycodone. Oxymorphone is also available as a    scheduled prescription medication. Drug Absent but Declared for Prescription Verification   Clonazepam                     Not Detected UNEXPECTED ng/mg creat ==================================================================== Test                      Result    Flag   Units      Ref Range   Creatinine              52               mg/dL      >=20 ==================================================================== Declared Medications:  The flagging and interpretation on this report are based on the  following declared medications.  Unexpected results may arise from  inaccuracies in the declared medications.  **Note: The testing scope of this  panel includes these medications:  Clonazepam (Klonopin)  Oxycodone  **Note: The testing scope of this panel does not include following  reported medications:  Amitriptyline (Elavil)  Cyclobenzaprine (Flexeril)  Duloxetine (Cymbalta)  Furosemide (Lasix)  Hydrochlorothiazide (Prinzide)  Hydrochlorothiazide (Zestoretic)  Lisinopril (Prinzide)  Lisinopril (Zestoretic)  Meclizine (Antivert)  Metformin  Omeprazole (Prilosec)  Vitamin D3 ==================================================================== For clinical consultation, please call [phone]. ====================================================================    UDS interpretation: Compliant          Medication Assessment Form: Reviewed. Patient indicates being compliant with therapy Treatment compliance: Compliant Risk Assessment Profile: Aberrant behavior: See prior evaluations. None observed or detected today Comorbid factors increasing risk of overdose: See prior notes. No additional risks detected today Risk of substance use disorder (SUD): Low Opioid Risk Tool (ORT) Total Score:    Interpretation Table:  Score <3 = Low Risk for SUD  Score between 4-7 = Moderate Risk for SUD  Score >8  = High Risk for Opioid Abuse   Risk Mitigation Strategies:  Patient Counseling: Covered Patient-Prescriber Agreement (PPA): Present and active  Notification to other healthcare providers: Done  Pharmacologic Plan: No change in therapy, at this time  Laboratory Chemistry  Inflammation Markers (CRP: Acute Phase) (ESR: Chronic Phase) Lab Results  Component Value Date   CRP 1.0 (H) 04/05/2015   ESRSEDRATE 35 (H) 04/05/2015                 Renal Function Markers Lab Results  Component Value Date   BUN 10 07/22/2016   CREATININE 1.01 07/22/2016   GFRAA 80 07/22/2016   GFRNONAA 70 07/22/2016                 Hepatic Function Markers Lab Results  Component Value Date   AST 15 07/22/2016   ALT 20 07/22/2016   ALBUMIN 3.8 07/22/2016   ALKPHOS 96 07/22/2016                 Electrolytes Lab Results  Component Value Date   NA 139 07/22/2016   K 4.0 07/22/2016   CL 104 07/22/2016   CALCIUM 8.9 07/22/2016   MG 2.0 04/05/2015                 Neuropathy Markers No results found for: [id number]               Bone Pathology Markers Lab Results  Component Value Date   ALKPHOS 96 07/22/2016   VD25OH 38 01/20/2015   CALCIUM 8.9 07/22/2016                 Coagulation Parameters Lab Results  Component Value Date   INR 1.05 06/21/2016   LABPROT 13.8 06/21/2016   APTT 29 06/21/2016   PLT 262 07/22/2016                 Cardiovascular Markers Lab Results  Component Value Date   HGB 11.5 (L) 07/22/2016   HCT 36.7 07/22/2016                 Note: Lab results reviewed.  Recent Diagnostic Imaging Review  Ct Renal Stone Study  Result Date: 07/22/2016 CLINICAL DATA:  Bilateral flank pain and pelvic pain. EXAM: CT ABDOMEN AND PELVIS WITHOUT CONTRAST TECHNIQUE: Multidetector CT imaging of the abdomen and pelvis was performed following the standard protocol without IV contrast. COMPARISON:  CT scan 11/24/2015 FINDINGS: Lower chest: The lung bases are clear of acute process. No  pleural effusion or pulmonary lesions. The  heart is normal in size. No pericardial effusion. The distal esophagus and aorta are unremarkable. Hepatobiliary: No focal hepatic lesions or intrahepatic biliary dilatation. The gallbladder surgically absent. No common bile duct dilatation. Pancreas: No mass, inflammation or ductal dilatation. Spleen: Normal size.  No focal lesions. Adrenals/Urinary Tract: The adrenal glands are unremarkable. Small bilateral renal calculi. No hydronephrosis or hydroureter. There is a 3.5 mm calculus at the left UVJ. No right-sided ureteral calculi. No bladder calculi. Stomach/Bowel: The stomach, duodenum, small bowel and colon are grossly normal without oral contrast. No inflammatory changes, mass lesions or obstructive findings. The appendix is normal. Vascular/Lymphatic: Normal caliber aorta. No atherosclerotic calcifications. Scattered mesenteric and retroperitoneal lymph nodes but no mass or adenopathy. Reproductive: The uterus and ovaries are normal. Other: No pelvic mass or adenopathy. No free pelvic fluid collections. No inguinal mass or adenopathy. No abdominal wall hernia or subcutaneous lesions. Musculoskeletal: No significant bony findings. There is a spinal cord stimulator noted in the thoracic region. IMPRESSION: 1. 3.5 mm nonobstructive left UVJ calculus. 2. Small bilateral renal calculi. 3. No other significant abdominal/pelvic findings. Electronically Signed   By: Marijo Sanes M.D.   On: 07/22/2016 17:25   Note: Imaging results reviewed.          Meds   Current Meds  Medication Sig  . albuterol (PROVENTIL HFA;VENTOLIN HFA) 108 (90 Base) MCG/ACT inhaler Inhale 1-2 puffs into the lungs every 6 (six) hours as needed for wheezing or shortness of breath.  [name] amitriptyline (ELAVIL) 100 MG tablet TAKE 1 TABLET (100 MG TOTAL) BY MOUTH AT BEDTIME.  [name] Cholecalciferol (VITAMIN D3) 2000 units TABS Take 1 tablet by mouth daily.  . DULoxetine (CYMBALTA) 60 MG capsule Take 1  capsule (60 mg total) by mouth 2 (two) times daily.  . hydroxychloroquine (PLAQUENIL) 200 MG tablet Take 200 mg by mouth 2 (two) times daily.   [name] lisinopril-hydrochlorothiazide (PRINZIDE,ZESTORETIC) 10-12.5 MG tablet Take 0.5 tablets by mouth daily.  Derrill Memo ON 08/22/2016] metaxalone (SKELAXIN) 800 MG tablet Take 1 tablet (800 mg total) by mouth 3 (three) times daily.  [name] omeprazole (PRILOSEC) 40 MG capsule TAKE 1 CAPSULE (40 MG TOTAL) BY MOUTH 2 (TWO) TIMES DAILY.  [name] phenazopyridine (PYRIDIUM) 200 MG tablet Take 1 tablet (200 mg total) by mouth 3 (three) times daily.  . pregabalin (LYRICA) 300 MG capsule Take 600 mg by mouth at bedtime.  . tamsulosin (FLOMAX) 0.4 MG CAPS capsule Take 1 capsule (0.4 mg total) by mouth daily.  . [DISCONTINUED] metaxalone (SKELAXIN) 800 MG tablet Take 1 tablet (800 mg total) by mouth 3 (three) times daily.  . [DISCONTINUED] metaxalone (SKELAXIN) 800 MG tablet Take 1 tablet (800 mg total) by mouth 3 (three) times daily.    ROS  Constitutional: Denies any fever or chills Gastrointestinal: No reported hemesis, hematochezia, vomiting, or acute GI distress Musculoskeletal: Denies any acute onset joint swelling, redness, loss of ROM, or weakness Neurological: No reported episodes of acute onset apraxia, aphasia, dysarthria, agnosia, amnesia, paralysis, loss of coordination, or loss of consciousness  Allergies  Ms. Mcinerney is allergic to keflex [cephalexin]; amoxicillin; celebrex [celecoxib]; pentazocine lactate; bactrim [sulfamethoxazole-trimethoprim]; doxycycline; erythromycin; hydromorphone hcl; latex; and nabumetone.  [place]  Drug: Ms. Biggers  reports that she does not use drugs. Alcohol:  reports that she does not drink alcohol. Tobacco:  reports that she quit smoking about 12 months ago. She has never used smokeless tobacco. Medical:  has a past medical history of Abscess of axillary fold (10/10/2013); Acute bronchitis with  asthma with acute exacerbation  (01/23/2015); Fibromyalgia; Hypertension; Lupus; Osteoarthritis; Pain; and Sepsis affecting skin ([place]) (10/10/2013). Surgical: Ms. White  has a past surgical history that includes spinal cord stimulator; Occipital nerve stimulator insertion; Cholecystectomy (2000); Knee surgery; Shoulder surgery; Tubal ligation (2000); Endometrial ablation; Spinal cord stimulator implant (2008); Knee surgery (Right, 02-2015); Breast surgery; and Spinal cord stimulator insertion (N/A, 06/21/2016). Family: family history includes Asthma in her mother; Depression in her mother.  Constitutional Exam  General appearance: Well nourished, well developed, and well hydrated. In no apparent acute distress Vitals:   08/19/16 1124  BP: (!) 161/90  Pulse: 92  Resp: 18  Temp: 98.1 F (36.7 C)  TempSrc: Oral  SpO2: 100%  Weight: 251 lb (113.9 kg)  Height: 5' 4"  (1.626 m)   BMI Assessment: Estimated body mass index is 43.08 kg/m as calculated from the following:   Height as of this encounter: 5' 4"  (1.626 m).   Weight as of this encounter: 251 lb (113.9 kg).  BMI interpretation table: BMI level Category Range association with higher incidence of chronic pain  <18 kg/m2 Underweight   18.5-24.9 kg/m2 Ideal body weight   25-29.9 kg/m2 Overweight Increased incidence by 20%  30-34.9 kg/m2 Obese (Class I) Increased incidence by 68%  35-39.9 kg/m2 Severe obesity (Class II) Increased incidence by 136%  >40 kg/m2 Extreme obesity (Class III) Increased incidence by 254%   BMI Readings from Last 4 Encounters:  08/19/16 43.08 kg/m  07/22/16 44.80 kg/m  07/14/16 43.94 kg/m  06/18/16 44.20 kg/m   Wt Readings from Last 4 Encounters:  08/19/16 251 lb (113.9 kg)  07/22/16 261 lb (118.4 kg)  07/14/16 256 lb (116.1 kg)  06/18/16 257 lb 8 oz (116.8 kg)  Psych/Mental status: Alert, oriented x 3 (person, place, & time)       Eyes: PERLA Respiratory: No evidence of acute respiratory distress  Cervical Spine Exam   Inspection: No masses, redness, or swelling Alignment: Symmetrical Functional ROM: Unrestricted ROM      Stability: No instability detected Muscle strength & Tone: Functionally intact Sensory: Unimpaired Palpation: No palpable anomalies              Upper Extremity (UE) Exam    Side: Right upper extremity  Side: Left upper extremity  Inspection: No masses, redness, swelling, or asymmetry. No contractures  Inspection: No masses, redness, swelling, or asymmetry. No contractures  Functional ROM: Decreased ROM for elbow  Functional ROM: Unrestricted ROM          Muscle strength & Tone: Functionally intact  Muscle strength & Tone: Functionally intact  Sensory: Unimpaired  Sensory: Unimpaired  Palpation: No palpable anomalies              Palpation: No palpable anomalies              Specialized Test(s): Deferred         Specialized Test(s): Deferred          Thoracic Spine Exam  Inspection: No masses, redness, or swelling Alignment: Symmetrical Functional ROM: Unrestricted ROM Stability: No instability detected Sensory: Unimpaired Muscle strength & Tone: No palpable anomalies  Lumbar Spine Exam  Inspection: No masses, redness, or swelling Alignment: Symmetrical Functional ROM: Unrestricted ROM      Stability: No instability detected Muscle strength & Tone: Functionally intact Sensory: Unimpaired Palpation: No palpable anomalies       Provocative Tests: Lumbar Hyperextension and rotation test: evaluation deferred today       Patrick's Maneuver: evaluation  deferred today                    Gait & Posture Assessment  Ambulation: Unassisted Gait: Relatively normal for age and body habitus Posture: WNL   Lower Extremity Exam    Side: Right lower extremity  Side: Left lower extremity  Inspection: No masses, redness, swelling, or asymmetry. No contractures  Inspection: No masses, redness, swelling, or asymmetry. No contractures  Functional ROM: Unrestricted ROM          Functional  ROM: Unrestricted ROM          Muscle strength & Tone: Functionally intact  Muscle strength & Tone: Functionally intact  Sensory: Unimpaired  Sensory: Unimpaired  Palpation: No palpable anomalies  Palpation: No palpable anomalies   Assessment  Primary Diagnosis & Pertinent Problem List: The primary encounter diagnosis was Chronic neck pain (Location of Primary Source of Pain) (Bilateral) (L>R). Diagnoses of Chronic cervical radicular pain (C7 Dermatome) (Bilateral) (L>R), Chronic pain of both upper extremities, Chronic pain syndrome, Muscle cramping, Musculoskeletal pain, and Long term current use of opiate analgesic were also pertinent to this visit.  Status Diagnosis  Controlled Controlled Controlled 1. Chronic neck pain (Location of Primary Source of Pain) (Bilateral) (L>R)   2. Chronic cervical radicular pain (C7 Dermatome) (Bilateral) (L>R)   3. Chronic pain of both upper extremities   4. Chronic pain syndrome   5. Muscle cramping   6. Musculoskeletal pain   7. Long term current use of opiate analgesic     Problems updated and reviewed during this visit: Problem  Positive Ana (Antinuclear Antibody)  Lumbar Spondylosis  Lumbar facet syndrome (Bilateral) (R>L)  Implant pain at battery site (right buttocks area)  Right Knee Pain  Musculoskeletal Pain  Muscle Cramping  Osteoarthrosis  Presence of functional implant (Medtronic Lumbar spinal cord stimulator implant)   Non-MRI compatible system. 08/2007.   Chronic low back pain (Bilateral) (R>L)  Chronic lumbar radicular pain (Right L5 dermatome; Left S1 Dermatome) (Bilateral) (R>L)   Right L5 radicular pain. Left S1 radicular pain.   Chronic hip pain (Location of Tertiary source of pain) (Bilateral) (R>L)  Chronic neck pain (Location of Primary Source of Pain) (Bilateral) (L>R)  Chronic upper extremity pain (Location of Secondary source of pain) (Bilateral) (L>R)  Chronic cervical radicular pain (C7 Dermatome) (Bilateral) (L>R)   Chronic lower extremity pain (Bilateral) (R>L)  Chronic Pain Syndrome  Long Term Current Use of Opiate Analgesic  Long Term Prescription Opiate Use  Opiate use (30 MME/Day)  Opiate Dependence ([facility])  Spinal Cord Stimulator Status  Battery End of Life of Spinal Cord Stimulator  Anxiety and Depression  Encounter for Therapeutic Drug Level Monitoring  Vitamin D Insufficiency  Encounter for Management of Implanted Device  Morbid Obesity ([facility])  Nicotine Dependence  Obesity  Generalized Anxiety Disorder  Essential Hypertension, Benign  GERD   Qualifier: Diagnosis of  By: Bobby Rumpf CMA (AAMA), Patty     Irritable Bowel Syndrome   Qualifier: Diagnosis of  By: Bobby Rumpf CMA (AAMA), Patty     KIDNEY STONES   Qualifier: History of  ByBobby Rumpf CMA (AAMA), Patty      Plan of Care  Pharmacotherapy (Medications Ordered): Meds ordered this encounter  Medications  . DISCONTD: metaxalone (SKELAXIN) 800 MG tablet    Sig: Take 1 tablet (800 mg total) by mouth 3 (three) times daily.    Dispense:  270 tablet    Refill:  0    Do not place this  medication, or any other prescription from our practice, on "Automatic Refill". Patient may have prescription filled one day early if pharmacy is closed on scheduled refill date.    Order Specific Question:   Supervising Provider    Answer:   Milinda Pointer [phone]  . oxyCODONE (OXY IR/ROXICODONE) 5 MG immediate release tablet    Sig: Take 1 tablet (5 mg total) by mouth every 6 (six) hours as needed for severe pain.    Dispense:  120 tablet    Refill:  0    Do not add this medication to the electronic "Automatic Refill" notification system. Patient may have prescription filled one day early if pharmacy is closed on scheduled refill date. Do not fill until: 08/22/16 To last until:09/21/16    Order Specific Question:   Supervising Provider    Answer:   Milinda Pointer [phone]  . oxyCODONE (OXY IR/ROXICODONE) 5 MG immediate release tablet    Sig:  Take 1 tablet (5 mg total) by mouth every 6 (six) hours as needed for severe pain.    Dispense:  120 tablet    Refill:  0    Patient may have prescription filled one day early if pharmacy is closed on scheduled refill date. Do not fill until: 09/21/16 To last until: 10/21/16    Order Specific Question:   Supervising Provider    Answer:   Milinda Pointer [phone]  . oxyCODONE (OXY IR/ROXICODONE) 5 MG immediate release tablet    Sig: Take 1 tablet (5 mg total) by mouth every 6 (six) hours as needed for severe pain.    Dispense:  120 tablet    Refill:  0    Do not add this medication to the electronic "Automatic Refill" notification system. Patient may have prescription filled one day early if pharmacy is closed on scheduled refill date. Do not fill until: 10/21/16 To last until: 11/20/16    Order Specific Question:   Supervising Provider    Answer:   Milinda Pointer [phone]  . metaxalone (SKELAXIN) 800 MG tablet    Sig: Take 1 tablet (800 mg total) by mouth 3 (three) times daily.    Dispense:  270 tablet    Refill:  0    Do not place this medication, or any other prescription from our practice, on "Automatic Refill". Patient may have prescription filled one day early if pharmacy is closed on scheduled refill date.    Order Specific Question:   Supervising Provider    Answer:   Milinda Pointer [[id number]]   New Prescriptions   No medications on file   Medications administered today: Ms. Makarewicz had no medications administered during this visit. Lab-work, procedure(s), and/or referral(s): Orders Placed This Encounter  Procedures  . ToxASSURE Select 13 (MW), Urine   Imaging and/or referral(s): None  Patient instructions if she continues to have upper extremity pain call for repeat cervical x-ray.  Interventional therapies: Planned, scheduled, and/or pending:   Not this time    Considering:   Palliative Bilateral lumbar facet radiofrequency.    Palliative PRN  treatment(s):   Palliative left-sided C7-T1 cervical epidural steroid injection under fluoroscopic guidance and IV sedation.     Provider-requested follow-up: Return in about 3 months (around 11/19/2016) for MedMgmt.  Future Appointments Date Time Provider [place]  11/18/2016 11:30 AM Vevelyn Francois, NP [facility] None   Primary Care Physician: System, Provider Not In Location: [facility] Outpatient Pain Management Facility Note by: Vevelyn Francois NP Date: 08/19/2016; Time: 1:10 PM  Pain Score Disclaimer: We use the NRS-11 scale. This is a self-reported, subjective measurement of pain severity with only modest accuracy. It is used primarily to identify changes within a particular patient. It must be understood that outpatient pain scales are significantly less accurate that those used for research, where they can be applied under ideal controlled circumstances with minimal exposure to variables. In reality, the score is likely to be a combination of pain intensity and pain affect, where pain affect describes the degree of emotional arousal or changes in action readiness caused by the sensory experience of pain. Factors such as social and work situation, setting, emotional state, anxiety levels, expectation, and prior pain experience may influence pain perception and show large inter-individual differences that may also be affected by time variables.  Patient instructions provided during this appointment: Patient Instructions   ____________________________________________________________________________________________  Medication Rules  Applies to: All patients receiving prescriptions (written or electronic).  Pharmacy of record: Pharmacy where electronic prescriptions will be sent. If written prescriptions are taken to a different pharmacy, please inform the nursing staff. The pharmacy listed in the electronic medical record should be the one where you would like electronic  prescriptions to be sent.  Prescription refills: Only during scheduled appointments. Applies to both, written and electronic prescriptions.  NOTE: The following applies primarily to controlled substances (Opioid* Pain Medications).   Patient's responsibilities: 1. Pain Pills: Bring all pain pills to every appointment (except for procedure appointments). 2. Pill Bottles: Bring pills in original pharmacy bottle. Always bring newest bottle. Bring bottle, even if empty. 3. Medication refills: You are responsible for knowing and keeping track of what medications you need refilled. The day before your appointment, write a list of all prescriptions that need to be refilled. Bring that list to your appointment and give it to the admitting nurse. Prescriptions will be written only during appointments. If you forget a medication, it will not be "Called in", "Faxed", or "electronically sent". You will need to get another appointment to get these prescribed. 4. Prescription Accuracy: You are responsible for carefully inspecting your prescriptions before leaving our office. Have the discharge nurse carefully go over each prescription with you, before taking them home. Make sure that your name is accurately spelled, that your address is correct. Check the name and dose of your medication to make sure it is accurate. Check the number of pills, and the written instructions to make sure they are clear and accurate. Make sure that you are given enough medication to last until your next medication refill appointment. 5. Taking Medication: Take medication as prescribed. Never take more pills than instructed. Never take medication more frequently than prescribed. Taking less pills or less frequently is permitted and encouraged, when it comes to controlled substances (written prescriptions).  6. Inform other Doctors: Always inform, all of your healthcare providers, of all the medications you take. 7. Pain Medication from  other Providers: You are not allowed to accept any additional pain medication from any other Doctor or Healthcare provider. There are two exceptions to this rule. (see below) In the event that you require additional pain medication, you are responsible for notifying us, as stated below. 8. Medication Agreement: You are responsible for carefully reading and following our Medication Agreement. This must be signed before receiving any prescriptions from our practice. Safely store a copy of your signed Agreement. Violations to the Agreement will result in no further prescriptions. (Additional copies of our Medication Agreement are available upon request.) 9. Laws, Rules, &  Regulations: All patients are expected to follow all Federal and [employer], [employer], Rules, [employer]. Ignorance of the Laws does not constitute a valid excuse. The use of any illegal substances is prohibited. 10. Adopted CDC guidelines & recommendations: Target dosing levels will be at or below 60 MME/day. Use of benzodiazepines** is not recommended.  Exceptions: There are only two exceptions to the rule of not receiving pain medications from other Healthcare Providers. 1. Exception #1 (Emergencies): In the event of an emergency (i.e.: accident requiring emergency care), you are allowed to receive additional pain medication. However, you are responsible for: As soon as you are able, call our office [phone], at any time of the day or night, and leave a message stating your name, the date and nature of the emergency, and the name and dose of the medication prescribed. In the event that your call is answered by a member of our staff, make sure to document and save the date, time, and the name of the person that took your information.  2. Exception #2 (Planned Surgery): In the event that you are scheduled by another doctor or dentist to have any type of surgery or procedure, you are allowed (for a period no longer than 30 days), to  receive additional pain medication, for the acute post-op pain. However, in this case, you are responsible for picking up a copy of our "Post-op Pain Management for Surgeons" handout, and giving it to your surgeon or dentist. This document is available at our office, and does not require an appointment to obtain it. Simply go to our office during business hours (Monday-Thursday from 8:00 AM to 4:00 PM) (Friday 8:00 AM to 12:00 Noon) or if you have a scheduled appointment with [place], prior to your surgery, and ask for it by name. In addition, you will need to provide [place] with your name, name of your surgeon, type of surgery, and date of procedure or surgery.  *Opioid medications include: morphine, codeine, oxycodone, oxymorphone, hydrocodone, hydromorphone, meperidine, tramadol, tapentadol, buprenorphine, fentanyl, methadone. **Benzodiazepine medications include: diazepam (Valium), alprazolam (Xanax), clonazepam (Klonopine), lorazepam (Ativan), clorazepate (Tranxene), chlordiazepoxide (Librium), estazolam (Prosom), oxazepam (Serax), temazepam (Restoril), triazolam (Halcion)  ____________________________________________________________________________________________ Oxycodone prescriptions x 3 months given.

## 2016-08-19 NOTE — Progress Notes (Signed)
Nursing Pain Medication Assessment:  Safety precautions to be maintained throughout the outpatient stay will include: orient to surroundings, keep bed in low position, maintain call bell within reach at all times, provide assistance with transfer out of bed and ambulation.  Medication Inspection Compliance: Pill count conducted under aseptic conditions, in front of the patient. Neither the pills nor the bottle was removed from the patient's sight at any time. Once count was completed pills were immediately returned to the patient in their original bottle.  Medication: Oxycodone IR Pill/Patch Count: 10 of 120 pills remain Pill/Patch Appearance: Markings consistent with prescribed medication Bottle Appearance: Standard pharmacy container. Clearly labeled. Filled Date: 06 / 12 / 2018 Last Medication intake:  Yesterday

## 2016-08-19 NOTE — Patient Instructions (Addendum)
____________________________________________________________________________________________  Medication Rules  Applies to: All patients receiving prescriptions (written or electronic).  Pharmacy of record: Pharmacy where electronic prescriptions will be sent. If written prescriptions are taken to a different pharmacy, please inform the nursing staff. The pharmacy listed in the electronic medical record should be the one where you would like electronic prescriptions to be sent.  Prescription refills: Only during scheduled appointments. Applies to both, written and electronic prescriptions.  NOTE: The following applies primarily to controlled substances (Opioid* Pain Medications).   Patient's responsibilities: 1. Pain Pills: Bring all pain pills to every appointment (except for procedure appointments). 2. Pill Bottles: Bring pills in original pharmacy bottle. Always bring newest bottle. Bring bottle, even if empty. 3. Medication refills: You are responsible for knowing and keeping track of what medications you need refilled. The day before your appointment, write a list of all prescriptions that need to be refilled. Bring that list to your appointment and give it to the admitting nurse. Prescriptions will be written only during appointments. If you forget a medication, it will not be "Called in", "Faxed", or "electronically sent". You will need to get another appointment to get these prescribed. 4. Prescription Accuracy: You are responsible for carefully inspecting your prescriptions before leaving our office. Have the discharge nurse carefully go over each prescription with you, before taking them home. Make sure that your name is accurately spelled, that your address is correct. Check the name and dose of your medication to make sure it is accurate. Check the number of pills, and the written instructions to make sure they are clear and accurate. Make sure that you are given enough medication to  last until your next medication refill appointment. 5. Taking Medication: Take medication as prescribed. Never take more pills than instructed. Never take medication more frequently than prescribed. Taking less pills or less frequently is permitted and encouraged, when it comes to controlled substances (written prescriptions).  6. Inform other Doctors: Always inform, all of your healthcare providers, of all the medications you take. 7. Pain Medication from other Providers: You are not allowed to accept any additional pain medication from any other Doctor or Healthcare provider. There are two exceptions to this rule. (see below) In the event that you require additional pain medication, you are responsible for notifying us, as stated below. 8. Medication Agreement: You are responsible for carefully reading and following our Medication Agreement. This must be signed before receiving any prescriptions from our practice. Safely store a copy of your signed Agreement. Violations to the Agreement will result in no further prescriptions. (Additional copies of our Medication Agreement are available upon request.) 9. Laws, Rules, & Regulations: All patients are expected to follow all Federal and State Laws, Statutes, Rules, & Regulations. Ignorance of the Laws does not constitute a valid excuse. The use of any illegal substances is prohibited. 10. Adopted CDC guidelines & recommendations: Target dosing levels will be at or below 60 MME/day. Use of benzodiazepines** is not recommended.  Exceptions: There are only two exceptions to the rule of not receiving pain medications from other Healthcare Providers. 1. Exception #1 (Emergencies): In the event of an emergency (i.e.: accident requiring emergency care), you are allowed to receive additional pain medication. However, you are responsible for: As soon as you are able, call our office [phone], at any time of the day or night, and leave a message stating your  name, the date and nature of the emergency, and the name and dose of the medication   prescribed. In the event that your call is answered by a member of our staff, make sure to document and save the date, time, and the name of the person that took your information.  2. Exception #2 (Planned Surgery): In the event that you are scheduled by another doctor or dentist to have any type of surgery or procedure, you are allowed (for a period no longer than 30 days), to receive additional pain medication, for the acute post-op pain. However, in this case, you are responsible for picking up a copy of our "Post-op Pain Management for Surgeons" handout, and giving it to your surgeon or dentist. This document is available at our office, and does not require an appointment to obtain it. Simply go to our office during business hours (Monday-Thursday from 8:00 AM to 4:00 PM) (Friday 8:00 AM to 12:00 Noon) or if you have a scheduled appointment with [place], prior to your surgery, and ask for it by name. In addition, you will need to provide [place] with your name, name of your surgeon, type of surgery, and date of procedure or surgery.  *Opioid medications include: morphine, codeine, oxycodone, oxymorphone, hydrocodone, hydromorphone, meperidine, tramadol, tapentadol, buprenorphine, fentanyl, methadone. **Benzodiazepine medications include: diazepam (Valium), alprazolam (Xanax), clonazepam (Klonopine), lorazepam (Ativan), clorazepate (Tranxene), chlordiazepoxide (Librium), estazolam (Prosom), oxazepam (Serax), temazepam (Restoril), triazolam (Halcion)  ____________________________________________________________________________________________ Oxycodone prescriptions x 3 months given.

## 2016-08-22 LAB — TOXASSURE SELECT 13 (MW), URINE

## 2016-08-26 NOTE — Progress Notes (Signed)
NO MED NOTE: This forensic urine drug screen (UDS) test was conducted using a state-of-the-art ultra high performance liquid chromatography and mass spectrometry system (UPLC/MS-MS), the most sophisticated and accurate method available. UPLC/MS-MS is 1,000 times more precise and accurate than standard gas chromatography and mass spectrometry (GC/MS). This system can analyze 26 drug categories and 180 drug compounds.  The findings of this UDT were reported as abnormal due to inconsistencies with expected results. An expected substance was not present in the sample. Expectations were based on the medication history provided by the patient at the time of sample collection. Results may suggest one of the following possibilities:  1). Possible opioid diversion. 2). Non-compliance with instructions on how to take the medication, including increase intake of medication early in the prescription refill, possibly running out towards the end of the prescribed period. 3). A scheduling issue where the patient was unable to come in before running out of medication. 4). PRN and/or low dose use of medication. 

## 2016-09-24 ENCOUNTER — Other Ambulatory Visit: Payer: Self-pay | Admitting: Family Medicine

## 2016-09-24 DIAGNOSIS — R300 Dysuria: Secondary | ICD-10-CM

## 2016-09-30 ENCOUNTER — Telehealth: Payer: Self-pay | Admitting: *Deleted

## 2016-09-30 ENCOUNTER — Telehealth: Payer: Self-pay

## 2016-09-30 NOTE — Telephone Encounter (Signed)
C/o back pain with standing and looking down has. Unable to squat, bend lover, look down. States her lower back is hurting .Informed pt that she needs to come for evaluation. Pt needs to schedule appt for eval of lower back

## 2016-10-02 ENCOUNTER — Encounter: Payer: Self-pay | Admitting: Pain Medicine

## 2016-10-02 ENCOUNTER — Ambulatory Visit
Admission: RE | Admit: 2016-10-02 | Discharge: 2016-10-02 | Disposition: A | Discharge status: Home / Self Care | Payer: BLUE CROSS/BLUE SHIELD | Source: Ambulatory Visit | Attending: Nurse Practitioner | Admitting: Nurse Practitioner

## 2016-10-02 ENCOUNTER — Ambulatory Visit: Payer: BLUE CROSS/BLUE SHIELD | Attending: Pain Medicine | Admitting: Nurse Practitioner

## 2016-10-02 VITALS — BP 134/69 | HR 86 | Temp 98.2°F | Resp 16 | Ht 64.0 in | Wt 247.0 lb

## 2016-10-02 DIAGNOSIS — Z825 Family history of asthma and other chronic lower respiratory diseases: Secondary | ICD-10-CM | POA: Insufficient documentation

## 2016-10-02 DIAGNOSIS — E559 Vitamin D deficiency, unspecified: Secondary | ICD-10-CM | POA: Insufficient documentation

## 2016-10-02 DIAGNOSIS — G894 Chronic pain syndrome: Secondary | ICD-10-CM | POA: Diagnosis not present

## 2016-10-02 DIAGNOSIS — Z79891 Long term (current) use of opiate analgesic: Secondary | ICD-10-CM | POA: Diagnosis not present

## 2016-10-02 DIAGNOSIS — M47816 Spondylosis without myelopathy or radiculopathy, lumbar region: Secondary | ICD-10-CM | POA: Diagnosis not present

## 2016-10-02 DIAGNOSIS — M5416 Radiculopathy, lumbar region: Secondary | ICD-10-CM | POA: Diagnosis not present

## 2016-10-02 DIAGNOSIS — M79606 Pain in leg, unspecified: Secondary | ICD-10-CM

## 2016-10-02 DIAGNOSIS — M79602 Pain in left arm: Secondary | ICD-10-CM | POA: Diagnosis not present

## 2016-10-02 DIAGNOSIS — Z6841 Body Mass Index (BMI) 40.0 and over, adult: Secondary | ICD-10-CM | POA: Diagnosis not present

## 2016-10-02 DIAGNOSIS — Z87442 Personal history of urinary calculi: Secondary | ICD-10-CM | POA: Insufficient documentation

## 2016-10-02 DIAGNOSIS — I10 Essential (primary) hypertension: Secondary | ICD-10-CM | POA: Diagnosis not present

## 2016-10-02 DIAGNOSIS — M791 Myalgia: Secondary | ICD-10-CM | POA: Diagnosis not present

## 2016-10-02 DIAGNOSIS — K219 Gastro-esophageal reflux disease without esophagitis: Secondary | ICD-10-CM | POA: Diagnosis not present

## 2016-10-02 DIAGNOSIS — M4726 Other spondylosis with radiculopathy, lumbar region: Secondary | ICD-10-CM | POA: Insufficient documentation

## 2016-10-02 DIAGNOSIS — Z87891 Personal history of nicotine dependence: Secondary | ICD-10-CM | POA: Insufficient documentation

## 2016-10-02 DIAGNOSIS — M4696 Unspecified inflammatory spondylopathy, lumbar region: Secondary | ICD-10-CM | POA: Diagnosis not present

## 2016-10-02 DIAGNOSIS — M797 Fibromyalgia: Secondary | ICD-10-CM | POA: Insufficient documentation

## 2016-10-02 DIAGNOSIS — M25551 Pain in right hip: Secondary | ICD-10-CM | POA: Diagnosis not present

## 2016-10-02 DIAGNOSIS — F411 Generalized anxiety disorder: Secondary | ICD-10-CM | POA: Diagnosis not present

## 2016-10-02 DIAGNOSIS — Z885 Allergy status to narcotic agent status: Secondary | ICD-10-CM | POA: Insufficient documentation

## 2016-10-02 DIAGNOSIS — M25552 Pain in left hip: Secondary | ICD-10-CM | POA: Insufficient documentation

## 2016-10-02 DIAGNOSIS — Z5181 Encounter for therapeutic drug level monitoring: Secondary | ICD-10-CM | POA: Diagnosis not present

## 2016-10-02 DIAGNOSIS — K589 Irritable bowel syndrome without diarrhea: Secondary | ICD-10-CM | POA: Insufficient documentation

## 2016-10-02 DIAGNOSIS — G8929 Other chronic pain: Secondary | ICD-10-CM | POA: Diagnosis not present

## 2016-10-02 DIAGNOSIS — Z881 Allergy status to other antibiotic agents status: Secondary | ICD-10-CM | POA: Insufficient documentation

## 2016-10-02 DIAGNOSIS — M7918 Myalgia, other site: Secondary | ICD-10-CM

## 2016-10-02 DIAGNOSIS — M25561 Pain in right knee: Secondary | ICD-10-CM | POA: Insufficient documentation

## 2016-10-02 DIAGNOSIS — M1990 Unspecified osteoarthritis, unspecified site: Secondary | ICD-10-CM | POA: Diagnosis not present

## 2016-10-02 DIAGNOSIS — M79601 Pain in right arm: Secondary | ICD-10-CM | POA: Insufficient documentation

## 2016-10-02 DIAGNOSIS — M329 Systemic lupus erythematosus, unspecified: Secondary | ICD-10-CM | POA: Diagnosis not present

## 2016-10-02 DIAGNOSIS — F329 Major depressive disorder, single episode, unspecified: Secondary | ICD-10-CM | POA: Insufficient documentation

## 2016-10-02 DIAGNOSIS — Z818 Family history of other mental and behavioral disorders: Secondary | ICD-10-CM | POA: Insufficient documentation

## 2016-10-02 DIAGNOSIS — Z79899 Other long term (current) drug therapy: Secondary | ICD-10-CM | POA: Diagnosis not present

## 2016-10-02 DIAGNOSIS — Z880 Allergy status to penicillin: Secondary | ICD-10-CM | POA: Insufficient documentation

## 2016-10-02 DIAGNOSIS — M488X6 Other specified spondylopathies, lumbar region: Secondary | ICD-10-CM | POA: Diagnosis not present

## 2016-10-02 DIAGNOSIS — Z9049 Acquired absence of other specified parts of digestive tract: Secondary | ICD-10-CM | POA: Insufficient documentation

## 2016-10-02 DIAGNOSIS — M545 Low back pain: Secondary | ICD-10-CM | POA: Diagnosis present

## 2016-10-02 DIAGNOSIS — Z882 Allergy status to sulfonamides status: Secondary | ICD-10-CM | POA: Insufficient documentation

## 2016-10-02 DIAGNOSIS — Z91040 Latex allergy status: Secondary | ICD-10-CM | POA: Insufficient documentation

## 2016-10-02 DIAGNOSIS — M542 Cervicalgia: Secondary | ICD-10-CM | POA: Insufficient documentation

## 2016-10-02 MED ORDER — ORPHENADRINE CITRATE 30 MG/ML IJ SOLN
INTRAMUSCULAR | Status: AC
  Filled 2016-10-02: qty 2

## 2016-10-02 MED ORDER — KETOROLAC TROMETHAMINE 60 MG/2ML IM SOLN
60.0000 mg | Freq: Once | INTRAMUSCULAR | Status: AC
  Administered 2016-10-02: 60 mg via INTRAMUSCULAR

## 2016-10-02 MED ORDER — ORPHENADRINE CITRATE 30 MG/ML IJ SOLN
60.0000 mg | Freq: Once | INTRAMUSCULAR | Status: AC
  Administered 2016-10-02: 60 mg via INTRAMUSCULAR

## 2016-10-02 MED ORDER — KETOROLAC TROMETHAMINE 60 MG/2ML IM SOLN
INTRAMUSCULAR | Status: AC
  Filled 2016-10-02: qty 2

## 2016-10-02 NOTE — Patient Instructions (Addendum)
GENERAL RISKS AND COMPLICATIONS  What are the risk, side effects and possible complications? Generally speaking, most procedures are safe.  However, with any procedure there are risks, side effects, and the possibility of complications.  The risks and complications are dependent upon the sites that are lesioned, or the type of nerve block to be performed.  The closer the procedure is to the spine, the more serious the risks are.  Great care is taken when placing the radio frequency needles, block needles or lesioning probes, but sometimes complications can occur. 1. Infection: Any time there is an injection through the skin, there is a risk of infection.  This is why sterile conditions are used for these blocks.  There are four possible types of infection. 1. Localized skin infection. 2. Central Nervous System Infection-This can be in the form of Meningitis, which can be deadly. 3. Epidural Infections-This can be in the form of an epidural abscess, which can cause pressure inside of the spine, causing compression of the spinal cord with subsequent paralysis. This would require an emergency surgery to decompress, and there are no guarantees that the patient would recover from the paralysis. 4. Discitis-This is an infection of the intervertebral discs.  It occurs in about 1% of discography procedures.  It is difficult to treat and it may lead to surgery.        2. Pain: the needles have to go through skin and soft tissues, will cause soreness.       3. Damage to internal structures:  The nerves to be lesioned may be near blood vessels or    other nerves which can be potentially damaged.       4. Bleeding: Bleeding is more common if the patient is taking blood thinners such as  aspirin, Coumadin, Ticiid, Plavix, etc., or if he/she have some genetic predisposition  such as hemophilia. Bleeding into the spinal canal can cause compression of the spinal  cord with subsequent paralysis.  This would require  an emergency surgery to  decompress and there are no guarantees that the patient would recover from the  paralysis.       5. Pneumothorax:  Puncturing of a lung is a possibility, every time a needle is introduced in  the area of the chest or upper back.  Pneumothorax refers to free air around the  collapsed lung(s), inside of the thoracic cavity (chest cavity).  Another two possible  complications related to a similar event would include: Hemothorax and Chylothorax.   These are variations of the Pneumothorax, where instead of air around the collapsed  lung(s), you may have blood or chyle, respectively.       6. Spinal headaches: They may occur with any procedures in the area of the spine.       7. Persistent CSF (Cerebro-Spinal Fluid) leakage: This is a rare problem, but may occur  with prolonged intrathecal or epidural catheters either due to the formation of a fistulous  track or a dural tear.       8. Nerve damage: By working so close to the spinal cord, there is always a possibility of  nerve damage, which could be as serious as a permanent spinal cord injury with  paralysis.       9. Death:  Although rare, severe deadly allergic reactions known as "Anaphylactic  reaction" can occur to any of the medications used.      10. Worsening of the symptoms:  We can always make thing  worse.  What are the chances of something like this happening? Chances of any of this occuring are extremely low.  By statistics, you have more of a chance of getting killed in a motor vehicle accident: while driving to the hospital than any of the above occurring .  Nevertheless, you should be aware that they are possibilities.  In general, it is similar to taking a shower.  Everybody knows that you can slip, hit your head and get killed.  Does that mean that you should not shower again?  Nevertheless always keep in mind that statistics do not mean anything if you happen to be on the wrong side of them.  Even if a procedure has a 1  (one) in a 1,000,000 (million) chance of going wrong, it you happen to be that one..Also, keep in mind that by statistics, you have more of a chance of having something go wrong when taking medications.  Who should not have this procedure? If you are on a blood thinning medication (e.g. Coumadin, Plavix, see list of "Blood Thinners"), or if you have an active infection going on, you should not have the procedure.  If you are taking any blood thinners, please inform your physician.  How should I prepare for this procedure?  Do not eat or drink anything at least six hours prior to the procedure.  Bring a driver with you .  It cannot be a taxi.  Come accompanied by an adult that can drive you back, and that is strong enough to help you if your legs get weak or numb from the local anesthetic.  Take all of your medicines the morning of the procedure with just enough water to swallow them.  If you have diabetes, make sure that you are scheduled to have your procedure done first thing in the morning, whenever possible.  If you have diabetes, take only half of your insulin dose and notify our nurse that you have done so as soon as you arrive at the clinic.  If you are diabetic, but only take blood sugar pills (oral hypoglycemic), then do not take them on the morning of your procedure.  You may take them after you have had the procedure.  Do not take aspirin or any aspirin-containing medications, at least eleven (11) days prior to the procedure.  They may prolong bleeding.  Wear loose fitting clothing that may be easy to take off and that you would not mind if it got stained with Betadine or blood.  Do not wear any jewelry or perfume  Remove any nail coloring.  It will interfere with some of our monitoring equipment.  NOTE: Remember that this is not meant to be interpreted as a complete list of all possible complications.  Unforeseen problems may occur.  BLOOD THINNERS The following drugs  contain aspirin or other products, which can cause increased bleeding during surgery and should not be taken for 2 weeks prior to and 1 week after surgery.  If you should need take something for relief of minor pain, you may take acetaminophen which is found in Tylenol,m Datril, Anacin-3 and Panadol. It is not blood thinner. The products listed below are.  Do not take any of the products listed below in addition to any listed on your instruction sheet.  A.P.C or A.P.C with Codeine Codeine Phosphate Capsules #3 Ibuprofen Ridaura  ABC compound Congesprin Imuran rimadil  Advil Cope Indocin Robaxisal  Alka-Seltzer Effervescent Pain Reliever and Antacid Coricidin or Coricidin-D  Indomethacin  Rufen  Alka-Seltzer plus Cold Medicine Cosprin Ketoprofen S-A-C Tablets  Anacin Analgesic Tablets or Capsules Coumadin Korlgesic Salflex  Anacin Extra Strength Analgesic tablets or capsules CP-2 Tablets Lanoril Salicylate  Anaprox Cuprimine Capsules Levenox Salocol  Anexsia-D Dalteparin Magan Salsalate  Anodynos Darvon compound Magnesium Salicylate Sine-off  Ansaid Dasin Capsules Magsal Sodium Salicylate  Anturane Depen Capsules Marnal Soma  APF Arthritis pain formula Dewitt's Pills Measurin Stanback  Argesic Dia-Gesic Meclofenamic Sulfinpyrazone  Arthritis Bayer Timed Release Aspirin Diclofenac Meclomen Sulindac  Arthritis pain formula Anacin Dicumarol Medipren Supac  Analgesic (Safety coated) Arthralgen Diffunasal Mefanamic Suprofen  Arthritis Strength Bufferin Dihydrocodeine Mepro Compound Suprol  Arthropan liquid Dopirydamole Methcarbomol with Aspirin Synalgos  ASA tablets/Enseals Disalcid Micrainin Tagament  Ascriptin Doan's Midol Talwin  Ascriptin A/D Dolene Mobidin Tanderil  Ascriptin Extra Strength Dolobid Moblgesic Ticlid  Ascriptin with Codeine Doloprin or Doloprin with Codeine Momentum Tolectin  Asperbuf Duoprin Mono-gesic Trendar  Aspergum Duradyne Motrin or Motrin IB Triminicin  Aspirin  plain, buffered or enteric coated Durasal Myochrisine Trigesic  Aspirin Suppositories Easprin Nalfon Trillsate  Aspirin with Codeine Ecotrin Regular or Extra Strength Naprosyn Uracel  Atromid-S Efficin Naproxen Ursinus  Auranofin Capsules Elmiron Neocylate Vanquish  Axotal Emagrin Norgesic Verin  Azathioprine Empirin or Empirin with Codeine Normiflo Vitamin E  Azolid Emprazil Nuprin Voltaren  Bayer Aspirin plain, buffered or children's or timed BC Tablets or powders Encaprin Orgaran Warfarin Sodium  Buff-a-Comp Enoxaparin Orudis Zorpin  Buff-a-Comp with Codeine Equegesic Os-Cal-Gesic   Buffaprin Excedrin plain, buffered or Extra Strength Oxalid   Bufferin Arthritis Strength Feldene Oxphenbutazone   Bufferin plain or Extra Strength Feldene Capsules Oxycodone with Aspirin   Bufferin with Codeine Fenoprofen Fenoprofen Pabalate or Pabalate-SF   Buffets II Flogesic Panagesic   Buffinol plain or Extra Strength Florinal or Florinal with Codeine Panwarfarin   Buf-Tabs Flurbiprofen Penicillamine   Butalbital Compound Four-way cold tablets Penicillin   Butazolidin Fragmin Pepto-Bismol   Carbenicillin Geminisyn Percodan   Carna Arthritis Reliever Geopen Persantine   Carprofen Gold's salt Persistin   Chloramphenicol Goody's Phenylbutazone   Chloromycetin Haltrain Piroxlcam   Clmetidine heparin Plaquenil   Cllnoril Hyco-pap Ponstel   Clofibrate Hydroxy chloroquine Propoxyphen         Before stopping any of these medications, be sure to consult the physician who ordered them.  Some, such as Coumadin (Warfarin) are ordered to prevent or treat serious conditions such as "deep thrombosis", "pumonary embolisms", and other heart problems.  The amount of time that you may need off of the medication may also vary with the medication and the reason for which you were taking it.  If you are taking any of these medications, please make sure you notify your pain physician before you undergo any  procedures.          Epidural Steroid Injection An epidural steroid injection is a shot of steroid medicine and numbing medicine that is given into the space between the spinal cord and the bones in your back (epidural space). The shot helps relieve pain caused by an irritated or swollen nerve root. The amount of pain relief you get from the injection depends on what is causing the nerve to be swollen and irritated, and how long your pain lasts. You are more likely to benefit from this injection if your pain is strong and comes on suddenly rather than if you have had pain for a long time. Tell a health care provider about:  Any allergies you have.  All medicines you are taking,  including vitamins, herbs, eye drops, creams, and over-the-counter medicines.  Any problems you or family members have had with anesthetic medicines.  Any blood disorders you have.  Any surgeries you have had.  Any medical conditions you have.  Whether you are pregnant or may be pregnant. What are the risks? Generally, this is a safe procedure. However, problems may occur, including:  Headache.  Bleeding.  Infection.  Allergic reaction to medicines.  Damage to your nerves.  What happens before the procedure? Staying hydrated Follow instructions from your health care provider about hydration, which may include:  Up to 2 hours before the procedure - you may continue to drink clear liquids, such as water, clear fruit juice, black coffee, and plain tea.  Eating and drinking restrictions Follow instructions from your health care provider about eating and drinking, which may include:  8 hours before the procedure - stop eating heavy meals or foods such as meat, fried foods, or fatty foods.  6 hours before the procedure - stop eating light meals or foods, such as toast or cereal.  6 hours before the procedure - stop drinking milk or drinks that contain milk.  2 hours before the procedure - stop  drinking clear liquids.  Medicine  You may be given medicines to lower anxiety.  Ask your health care provider about: ? Changing or stopping your regular medicines. This is especially important if you are taking diabetes medicines or blood thinners. ? Taking medicines such as aspirin and ibuprofen. These medicines can thin your blood. Do not take these medicines before your procedure if your health care provider instructs you not to. General instructions  Plan to have someone take you home from the hospital or clinic. What happens during the procedure?  You may receive a medicine to help you relax (sedative).  You will be asked to lie on your abdomen.  The injection site will be cleaned.  A numbing medicine (local anesthetic) will be used to numb the injection site.  A needle will be inserted through your skin into the epidural space. You may feel some discomfort when this happens. An X-ray machine will be used to make sure the needle is put as close as possible to the affected nerve.  A steroid medicine and a local anesthetic will be injected into the epidural space.  The needle will be removed.  A bandage (dressing) will be put over the injection site. What happens after the procedure?  Your blood pressure, heart rate, breathing rate, and blood oxygen level will be monitored until the medicines you were given have worn off.  Your arm or leg may feel weak or numb for a few hours.  The injection site may feel sore.  Do not drive for 24 hours if you received a sedative. This information is not intended to replace advice given to you by your health care provider. Make sure you discuss any questions you have with your health care provider. Document Released: 05/07/2007 Document Revised: 07/12/2015 Document Reviewed: 05/16/2015 Elsevier Interactive Patient Education  2017 [employer]  Facet Joint Block The facet joints connect the bones of the spine (vertebrae). They make it  possible for you to bend, twist, and make other movements with your spine. They also keep you from bending too far, twisting too far, and making other excessive movements. A facet joint block is a procedure where a numbing medicine (anesthetic) is injected into a facet joint. Often, a type of anti-inflammatory medicine called a steroid is also  injected. A facet joint block may be done to diagnose neck or back pain. If the pain gets better after a facet joint block, it means the pain is probably coming from the facet joint. If the pain does not get better, it means the pain is probably not coming from the facet joint. A facet joint block may also be done to relieve neck or back pain caused by an inflamed facet joint. A facet joint block is only done to relieve pain if the pain does not improve with other methods, such as medicine, exercise programs, and physical therapy. Tell a health care provider about:  Any allergies you have.  All medicines you are taking, including vitamins, herbs, eye drops, creams, and over-the-counter medicines.  Any problems you or family members have had with anesthetic medicines.  Any blood disorders you have.  Any surgeries you have had.  Any medical conditions you have.  Whether you are pregnant or may be pregnant. What are the risks? Generally, this is a safe procedure. However, problems may occur, including:  Bleeding.  Injury to a nerve near the injection site.  Pain at the injection site.  Weakness or numbness in areas controlled by nerves near the injection site.  Infection.  Temporary fluid retention.  Allergic reactions to medicines or dyes.  Injury to other structures or organs near the injection site.  What happens before the procedure?  Follow instructions from your health care provider about eating or drinking restrictions.  Ask your health care provider about: ? Changing or stopping your regular medicines. This is especially important  if you are taking diabetes medicines or blood thinners. ? Taking medicines such as aspirin and ibuprofen. These medicines can thin your blood. Do not take these medicines before your procedure if your health care provider instructs you not to.  Do not take any new dietary supplements or medicines without asking your health care provider first.  Plan to have someone take you home after the procedure. What happens during the procedure?  You may need to remove your clothing and dress in an open-back gown.  The procedure will be done while you are lying on an X-ray table. You will most likely be asked to lie on your stomach, but you may be asked to lie in a different position if an injection will be made in your neck.  Machines will be used to monitor your oxygen levels, heart rate, and blood pressure.  If an injection will be made in your neck, an IV tube will be inserted into one of your veins. Fluids and medicine will flow directly into your body through the IV tube.  The area over the facet joint where the injection will be made will be cleaned with soap. The surrounding skin will be covered with clean drapes.  A numbing medicine (local anesthetic) will be applied to your skin. Your skin may sting or burn for a moment.  A video X-ray machine (fluoroscopy) will be used to locate the joint. In some cases, a CT scan may be used.  A contrast dye may be injected into the facet joint area to help locate the joint.  When the joint is located, an anesthetic will be injected into the joint through the needle.  Your health care provider will ask you whether you feel pain relief. If you do feel relief, a steroid may be injected to provide pain relief for a longer period of time. If you do not feel relief or feel only  partial relief, additional injections of an anesthetic may be made in other facet joints.  The needle will be removed.  Your skin will be cleaned.  A bandage (dressing) will be  applied over each injection site. The procedure may vary among health care providers and hospitals. What happens after the procedure?  You will be observed for 15-30 minutes before being allowed to go home. This information is not intended to replace advice given to you by your health care provider. Make sure you discuss any questions you have with your health care provider. Document Released: 06/19/2006 Document Revised: 03/01/2015 Document Reviewed: 10/24/2014 Elsevier Interactive Patient Education  [employer].

## 2016-10-02 NOTE — Progress Notes (Signed)
Safety precautions to be maintained throughout the outpatient stay will include: orient to surroundings, keep bed in low position, maintain call bell within reach at all times, provide assistance with transfer out of bed and ambulation.  

## 2016-10-02 NOTE — Progress Notes (Signed)
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 the patient would recover from the paralysis. 4. Discitis-This is an infection of the intervertebral discs.  It occurs in about 1% of discography procedures.  It is difficult to treat and it may lead to surgery.        2.  Pain: the needles have to go through skin and soft tissues, will cause soreness.       3. Damage to internal structures:  The nerves to be lesioned may be near blood vessels or    other nerves which can be potentially damaged.       4. Bleeding: Bleeding is more common if the patient is taking blood thinners such as  aspirin, Coumadin, Ticiid, Plavix, etc., or if he/she have some genetic predisposition  such as hemophilia. Bleeding into the spinal canal can cause compression of the spinal  cord with subsequent paralysis.  This would require an emergency surgery to  decompress and there are no guarantees that the patient  would recover from the  paralysis.       5. Pneumothorax:  Puncturing of a lung is a possibility, every time a needle is introduced in  the area of the chest or upper back.  Pneumothorax refers to free air around the  collapsed lung(s), inside of the thoracic cavity (chest cavity).  Another two possible  complications related to a similar event would include: Hemothorax and Chylothorax.   These are variations of the Pneumothorax, where instead of air around the collapsed  lung(s), you may have blood or chyle, respectively.       6. Spinal headaches: They may occur with any procedures in the area of the spine.       7. Persistent CSF (Cerebro-Spinal Fluid) leakage: This is a rare problem, but may occur  with prolonged intrathecal or epidural catheters either due to the formation of a fistulous  track or a dural tear.       8. Nerve damage: By working so close to the spinal cord, there is always a possibility of  nerve damage, which could be as serious as a permanent spinal cord injury with  paralysis.       9. Death:  Although rare, severe deadly allergic reactions known as "Anaphylactic  reaction" can occur to any of the medications used.      10. Worsening of the symptoms:  We can always make thing worse.  What are the chances of something like this happening? Chances of any of this occuring are extremely low.  By statistics, you have more of a chance of getting killed in a motor vehicle accident: while driving to the hospital than any of the above occurring .  Nevertheless, you should be aware that they are possibilities.  In general, it is similar to taking a shower.  Everybody knows that you can slip, hit your head and get killed.  Does that mean that you should not shower again?  Nevertheless always keep in mind that statistics do not mean anything if you happen to be on the wrong side of them.  Even if a procedure has a 1 (one) in a 1,000,000 (million) chance of going wrong, it you happen to be that  one..Also, keep in mind that by statistics, you have more of a chance of having something go wrong when taking medications.  Who should not have this procedure? If you are on a blood thinning medication (e.g. Coumadin, Plavix, see list of "Blood Thinners"), or if you have an active infection going on, you should not have the procedure.  If you are taking any blood thinners, please inform your physician.  How should I prepare for this procedure?  Do not eat or drink anything at least six hours prior to the procedure.  Bring a driver with  you .  It cannot be a taxi.  Come accompanied by an adult that can drive you back, and that is strong enough to help you if your legs get weak or numb from the local anesthetic.  Take all of your medicines the morning of the procedure with just enough water to swallow them.  If you have diabetes, make sure that you are scheduled to have your procedure done first thing in the morning, whenever possible.  If you have diabetes, take only half of your insulin dose and notify our nurse that you have done so as soon as you arrive at the clinic.  If you are diabetic, but only take blood sugar pills (oral hypoglycemic), then do not take them on the morning of your procedure.  You may take them after you have had the procedure.  Do not take aspirin or any aspirin-containing medications, at least eleven (11) days prior to the procedure.  They may prolong bleeding.  Wear loose fitting clothing that may be easy to take off and that you would not mind if it got stained with Betadine or blood.  Do not wear any jewelry or perfume  Remove any nail coloring.  It will interfere with some of our monitoring equipment.  NOTE: Remember that this is not meant to be interpreted as a complete list of all possible complications.  Unforeseen problems may occur.  BLOOD THINNERS The following drugs contain aspirin or other products, which can cause increased bleeding during  surgery and should not be taken for 2 weeks prior to and 1 week after surgery.  If you should need take something for relief of minor pain, you may take acetaminophen which is found in Tylenol,m Datril, Anacin-3 and Panadol. It is not blood thinner. The products listed below are.  Do not take any of the products listed below in addition to any listed on your instruction sheet.  A.P.C or A.P.C with Codeine Codeine Phosphate Capsules #3 Ibuprofen Ridaura  ABC compound Congesprin Imuran rimadil  Advil Cope Indocin Robaxisal  Alka-Seltzer Effervescent Pain Reliever and Antacid Coricidin or Coricidin-D  Indomethacin Rufen  Alka-Seltzer plus Cold Medicine Cosprin Ketoprofen S-A-C Tablets  Anacin Analgesic Tablets or Capsules Coumadin Korlgesic Salflex  Anacin Extra Strength Analgesic tablets or capsules CP-2 Tablets Lanoril Salicylate  Anaprox Cuprimine Capsules Levenox Salocol  Anexsia-D Dalteparin Magan Salsalate  Anodynos Darvon compound Magnesium Salicylate Sine-off  Ansaid Dasin Capsules Magsal Sodium Salicylate  Anturane Depen Capsules Marnal Soma  APF Arthritis pain formula Dewitt's Pills Measurin Stanback  Argesic Dia-Gesic Meclofenamic Sulfinpyrazone  Arthritis Bayer Timed Release Aspirin Diclofenac Meclomen Sulindac  Arthritis pain formula Anacin Dicumarol Medipren Supac  Analgesic (Safety coated) Arthralgen Diffunasal Mefanamic Suprofen  Arthritis Strength Bufferin Dihydrocodeine Mepro Compound Suprol  Arthropan liquid Dopirydamole Methcarbomol with Aspirin Synalgos  ASA tablets/Enseals Disalcid Micrainin Tagament  Ascriptin Doan's Midol Talwin  Ascriptin A/D Dolene Mobidin Tanderil  Ascriptin Extra Strength Dolobid Moblgesic Ticlid  Ascriptin with Codeine Doloprin or Doloprin with Codeine Momentum Tolectin  Asperbuf Duoprin Mono-gesic Trendar  Aspergum Duradyne Motrin or Motrin IB Triminicin  Aspirin plain, buffered or enteric coated Durasal Myochrisine Trigesic  Aspirin  Suppositories Easprin Nalfon Trillsate  Aspirin with Codeine Ecotrin Regular or Extra Strength Naprosyn Uracel  Atromid-S Efficin Naproxen Ursinus  Auranofin Capsules Elmiron Neocylate Vanquish  Axotal Emagrin Norgesic Verin  Azathioprine Empirin or Empirin with Codeine Normiflo Vitamin E  Azolid Emprazil Nuprin Voltaren  Bayer Aspirin plain, buffered or children's or timed BC Tablets or powders Encaprin  Orgaran Warfarin Sodium  Buff-a-Comp Enoxaparin Orudis Zorpin  Buff-a-Comp with Codeine Equegesic Os-Cal-Gesic   Buffaprin Excedrin plain, buffered or Extra Strength Oxalid   Bufferin Arthritis Strength Feldene Oxphenbutazone   Bufferin plain or Extra Strength Feldene Capsules Oxycodone with Aspirin   Bufferin with Codeine Fenoprofen Fenoprofen Pabalate or Pabalate-SF   Buffets II Flogesic Panagesic   Buffinol plain or Extra Strength Florinal or Florinal with Codeine Panwarfarin   Buf-Tabs Flurbiprofen Penicillamine   Butalbital Compound Four-way cold tablets Penicillin   Butazolidin Fragmin Pepto-Bismol   Carbenicillin Geminisyn Percodan   Carna Arthritis Reliever Geopen Persantine   Carprofen Gold's salt Persistin   Chloramphenicol Goody's Phenylbutazone   Chloromycetin Haltrain Piroxlcam   Clmetidine heparin Plaquenil   Cllnoril Hyco-pap Ponstel   Clofibrate Hydroxy chloroquine Propoxyphen         Before stopping any of these medications, be sure to consult the physician who ordered them.  Some, such as Coumadin (Warfarin) are ordered to prevent or treat serious conditions such as "deep thrombosis", "pumonary embolisms", and other heart problems.  The amount of time that you may need off of the medication may also vary with the medication and the reason for which you were taking it.  If you are taking any of these medications, please make sure you notify your pain physician before you undergo any procedures.          Epidural Steroid Injection An epidural steroid injection  is a shot of steroid medicine and numbing medicine that is given into the space between the spinal cord and the bones in your back (epidural space). The shot helps relieve pain caused by an irritated or swollen nerve root. The amount of pain relief you get from the injection depends on what is causing the nerve to be swollen and irritated, and how long your pain lasts. You are more likely to benefit from this injection if your pain is strong and comes on suddenly rather than if you have had pain for a long time. Tell a health care provider about:  Any allergies you have.  All medicines you are taking, including vitamins, herbs, eye drops, creams, and over-the-counter medicines.  Any problems you or family members have had with anesthetic medicines.  Any blood disorders you have.  Any surgeries you have had.  Any medical conditions you have.  Whether you are pregnant or may be pregnant. What are the risks? Generally, this is a safe procedure. However, problems may occur, including:  Headache.  Bleeding.  Infection.  Allergic reaction to medicines.  Damage to your nerves.  What happens before the procedure? Staying hydrated Follow instructions from your health care provider about hydration, which may include:  Up to 2 hours before the procedure - you may continue to drink clear liquids, such as water, clear fruit juice, black coffee, and plain tea.  Eating and drinking restrictions Follow instructions from your health care provider about eating and drinking, which may include:  8 hours before the procedure - stop eating heavy meals or foods such as meat, fried foods, or fatty foods.  6 hours before the procedure - stop eating light meals or foods, such as toast or cereal.  6 hours before the procedure - stop drinking milk or drinks that contain milk.  2 hours before the procedure - stop drinking clear liquids.  Medicine  You may be given medicines to lower  anxiety.  Ask your health care provider about: ? Changing or stopping your regular medicines. This is  especially important if you are taking diabetes medicines or blood thinners. ? Taking medicines such as aspirin and ibuprofen. These medicines can thin your blood. Do not take these medicines before your procedure if your health care provider instructs you not to. General instructions  Plan to have someone take you home from the hospital or clinic. What happens during the procedure?  You may receive a medicine to help you relax (sedative).  You will be asked to lie on your abdomen.  The injection site will be cleaned.  A numbing medicine (local anesthetic) will be used to numb the injection site.  A needle will be inserted through your skin into the epidural space. You may feel some discomfort when this happens. An X-ray machine will be used to make sure the needle is put as close as possible to the affected nerve.  A steroid medicine and a local anesthetic will be injected into the epidural space.  The needle will be removed.  A bandage (dressing) will be put over the injection site. What happens after the procedure?  Your blood pressure, heart rate, breathing rate, and blood oxygen level will be monitored until the medicines you were given have worn off.  Your arm or leg may feel weak or numb for a few hours.  The injection site may feel sore.  Do not drive for 24 hours if you received a sedative. This information is not intended to replace advice given to you by your health care provider. Make sure you discuss any questions you have with your health care provider. Document Released: 05/07/2007 Document Revised: 07/12/2015 Document Reviewed: 05/16/2015 Elsevier Interactive Patient Education  2017 [employer]  Facet Joint Block The facet joints connect the bones of the spine (vertebrae). They make it possible for you to bend, twist, and make other movements with your spine.  They also keep you from bending too far, twisting too far, and making other excessive movements. A facet joint block is a procedure where a numbing medicine (anesthetic) is injected into a facet joint. Often, a type of anti-inflammatory medicine called a steroid is also injected. A facet joint block may be done to diagnose neck or back pain. If the pain gets better after a facet joint block, it means the pain is probably coming from the facet joint. If the pain does not get better, it means the pain is probably not coming from the facet joint. A facet joint block may also be done to relieve neck or back pain caused by an inflamed facet joint. A facet joint block is only done to relieve pain if the pain does not improve with other methods, such as medicine, exercise programs, and physical therapy. Tell a health care provider about:  Any allergies you have.  All medicines you are taking, including vitamins, herbs, eye drops, creams, and over-the-counter medicines.  Any problems you or family members have had with anesthetic medicines.  Any blood disorders you have.  Any surgeries you have had.  Any medical conditions you have.  Whether you are pregnant or may be pregnant. What are the risks? Generally, this is a safe procedure. However, problems may occur, including:  Bleeding.  Injury to a nerve near the injection site.  Pain at the injection site.  Weakness or numbness in areas controlled by nerves near the injection site.  Infection.  Temporary fluid retention.  Allergic reactions to medicines or dyes.  Injury to other structures or organs near the injection site.  What happens  before the procedure?  Follow instructions from your health care provider about eating or drinking restrictions.  Ask your health care provider about: ? Changing or stopping your regular medicines. This is especially important if you are taking diabetes medicines or blood thinners. ? Taking medicines  such as aspirin and ibuprofen. These medicines can thin your blood. Do not take these medicines before your procedure if your health care provider instructs you not to.  Do not take any new dietary supplements or medicines without asking your health care provider first.  Plan to have someone take you home after the procedure. What happens during the procedure?  You may need to remove your clothing and dress in an open-back gown.  The procedure will be done while you are lying on an X-ray table. You will most likely be asked to lie on your stomach, but you may be asked to lie in a different position if an injection will be made in your neck.  Machines will be used to monitor your oxygen levels, heart rate, and blood pressure.  If an injection will be made in your neck, an IV tube will be inserted into one of your veins. Fluids and medicine will flow directly into your body through the IV tube.  The area over the facet joint where the injection will be made will be cleaned with soap. The surrounding skin will be covered with clean drapes.  A numbing medicine (local anesthetic) will be applied to your skin. Your skin may sting or burn for a moment.  A video X-ray machine (fluoroscopy) will be used to locate the joint. In some cases, a CT scan may be used.  A contrast dye may be injected into the facet joint area to help locate the joint.  When the joint is located, an anesthetic will be injected into the joint through the needle.  Your health care provider will ask you whether you feel pain relief. If you do feel relief, a steroid may be injected to provide pain relief for a longer period of time. If you do not feel relief or feel only partial relief, additional injections of an anesthetic may be made in other facet joints.  The needle will be removed.  Your skin will be cleaned.  A bandage (dressing) will be applied over each injection site. The procedure may vary among health care  providers and hospitals. What happens after the procedure?  You will be observed for 15-30 minutes before being allowed to go home. This information is not intended to replace advice given to you by your health care provider. Make sure you discuss any questions you have with your health care provider. Document Released: 06/19/2006 Document Revised: 03/01/2015 Document Reviewed: 10/24/2014 [employer] Interactive Patient Education  [employer].

## 2016-10-10 ENCOUNTER — Ambulatory Visit (HOSPITAL_BASED_OUTPATIENT_CLINIC_OR_DEPARTMENT_OTHER): Payer: BLUE CROSS/BLUE SHIELD | Admitting: Pain Medicine

## 2016-10-10 ENCOUNTER — Encounter: Payer: Self-pay | Admitting: Pain Medicine

## 2016-10-10 ENCOUNTER — Ambulatory Visit
Admission: RE | Admit: 2016-10-10 | Discharge: 2016-10-10 | Disposition: A | Discharge status: Home / Self Care | Payer: BLUE CROSS/BLUE SHIELD | Source: Ambulatory Visit | Attending: Pain Medicine | Admitting: Pain Medicine

## 2016-10-10 VITALS — BP 121/68 | HR 83 | Temp 98.9°F | Resp 16 | Ht 64.0 in | Wt 269.0 lb

## 2016-10-10 DIAGNOSIS — M792 Neuralgia and neuritis, unspecified: Secondary | ICD-10-CM

## 2016-10-10 DIAGNOSIS — M545 Low back pain, unspecified: Secondary | ICD-10-CM

## 2016-10-10 DIAGNOSIS — Z880 Allergy status to penicillin: Secondary | ICD-10-CM | POA: Insufficient documentation

## 2016-10-10 DIAGNOSIS — M47896 Other spondylosis, lumbar region: Secondary | ICD-10-CM | POA: Diagnosis not present

## 2016-10-10 DIAGNOSIS — M4696 Unspecified inflammatory spondylopathy, lumbar region: Secondary | ICD-10-CM

## 2016-10-10 DIAGNOSIS — M47816 Spondylosis without myelopathy or radiculopathy, lumbar region: Secondary | ICD-10-CM

## 2016-10-10 DIAGNOSIS — G8929 Other chronic pain: Secondary | ICD-10-CM

## 2016-10-10 DIAGNOSIS — M797 Fibromyalgia: Secondary | ICD-10-CM | POA: Diagnosis not present

## 2016-10-10 HISTORY — DX: Neuralgia and neuritis, unspecified: M79.2

## 2016-10-10 MED ORDER — FENTANYL CITRATE (PF) 100 MCG/2ML IJ SOLN
25.0000 ug | INTRAMUSCULAR | Status: DC | PRN
  Administered 2016-10-10: 100 ug via INTRAVENOUS
  Filled 2016-10-10: qty 2

## 2016-10-10 MED ORDER — TRIAMCINOLONE ACETONIDE 40 MG/ML IJ SUSP
40.0000 mg | Freq: Once | INTRAMUSCULAR | Status: AC
  Administered 2016-10-10: 40 mg
  Filled 2016-10-10: qty 1

## 2016-10-10 MED ORDER — LACTATED RINGERS IV SOLN
1000.0000 mL | Freq: Once | INTRAVENOUS | Status: AC
  Administered 2016-10-10: 1000 mL via INTRAVENOUS

## 2016-10-10 MED ORDER — ROPIVACAINE HCL 2 MG/ML IJ SOLN
9.0000 mL | Freq: Once | INTRAMUSCULAR | Status: AC
  Administered 2016-10-10: 10 mL via PERINEURAL
  Filled 2016-10-10: qty 10

## 2016-10-10 MED ORDER — MIDAZOLAM HCL 5 MG/5ML IJ SOLN
1.0000 mg | INTRAMUSCULAR | Status: DC | PRN
  Administered 2016-10-10: 3 mg via INTRAVENOUS
  Filled 2016-10-10: qty 5

## 2016-10-10 MED ORDER — PREGABALIN 150 MG PO CAPS: 150.0000 mg | ORAL_CAPSULE | Freq: Three times a day (TID) | ORAL | 2 refills | Status: DC

## 2016-10-10 MED ORDER — LIDOCAINE HCL 2 % IJ SOLN
10.0000 mL | Freq: Once | INTRAMUSCULAR | Status: AC
  Administered 2016-10-10: 400 mg
  Filled 2016-10-10: qty 20

## 2016-10-10 NOTE — Progress Notes (Addendum)
Patient's Name: Mary Cruz  MRN: [id number]  Referring Provider: No ref. provider found  DOB: [birth year]  PCP: System, Provider Not In  DOS: 10/10/2016  Note by: Gaspar Cola, MD  Service setting: Ambulatory outpatient  Specialty: Interventional Pain Management  Patient type: Established  Location: [facility] (AMB) Pain Management Facility  Visit type: Interventional Procedure   Primary Reason for Visit: Interventional Pain Management Treatment. CC: Back Pain (left lower)  Procedure:  Anesthesia, Analgesia, Anxiolysis:  Type: Diagnostic Medial Branch Facet Block Region: Lumbar Level: L2, L3, L4, L5, & S1 Medial Branch Level(s) Laterality: Left  Type: Local Anesthesia with Moderate (Conscious) Sedation Local Anesthetic: Lidocaine 1% Route: Intravenous (IV) IV Access: Secured Sedation: Meaningful verbal contact was maintained at all times during the procedure  Indication(s): Analgesia and Anxiety  Indications: 1. Lumbar facet syndrome (Bilateral) (R>L)   2. Lumbar spondylosis   3. Chronic low back pain (Bilateral) (R>L)   4. Neurogenic pain   5. Fibromyalgia    Pain Score: Pre-procedure: 6 /10 Post-procedure: 0-No pain/10  Pre-op Assessment:  Mary Cruz is a 41 y.o. (year old), female patient, seen today for interventional treatment. She  has a past surgical history that includes spinal cord stimulator; Occipital nerve stimulator insertion; Cholecystectomy (2000); Knee surgery; Shoulder surgery; Tubal ligation (2000); Endometrial ablation; Spinal cord stimulator implant (2008); Knee surgery (Right, 02-2015); Breast surgery; and Spinal cord stimulator insertion (N/A, 06/21/2016). Mary Cruz has a current medication list which includes the following prescription(s): albuterol, amitriptyline, vitamin d3, duloxetine, hydroxychloroquine, lisinopril-hydrochlorothiazide, metaxalone, omeprazole, oxycodone, oxycodone, phenazopyridine, pregabalin, tamsulosin, and oxycodone. Her primarily  concern today is the Back Pain (left lower)  Initial Vital Signs: Blood pressure 128/75, pulse 85, temperature 98.3 F (36.8 C), resp. rate 18, height 5\' 4"  (1.626 m), weight 269 lb (122 kg), last menstrual period 09/25/2016, SpO2 98 %. BMI: Estimated body mass index is 46.17 kg/m as calculated from the following:   Height as of this encounter: 5\' 4"  (1.626 m).   Weight as of this encounter: 269 lb (122 kg).  Risk Assessment: Allergies: Reviewed. She is allergic to keflex [cephalexin]; amoxicillin; celebrex [celecoxib]; pentazocine lactate; bactrim [sulfamethoxazole-trimethoprim]; doxycycline; erythromycin; hydromorphone hcl; latex; and nabumetone.  Allergy Precautions: None required Coagulopathies: Reviewed. None identified.  Blood-thinner therapy: None at this time Active Infection(s): Reviewed. None identified. Mary Cruz is afebrile  Site Confirmation: Mary Cruz was asked to confirm the procedure and laterality before marking the site Procedure checklist: Completed Consent: Before the procedure and under the influence of no sedative(s), amnesic(s), or anxiolytics, the patient was informed of the treatment options, risks and possible complications. To fulfill our ethical and legal obligations, as recommended by the American Medical Association's Code of Ethics, I have informed the patient of my clinical impression; the nature and purpose of the treatment or procedure; the risks, benefits, and possible complications of the intervention; the alternatives, including doing nothing; the risk(s) and benefit(s) of the alternative treatment(s) or procedure(s); and the risk(s) and benefit(s) of doing nothing. The patient was provided information about the general risks and possible complications associated with the procedure. These may include, but are not limited to: failure to achieve desired goals, infection, bleeding, organ or nerve damage, allergic reactions, paralysis, and death. In addition,  the patient was informed of those risks and complications associated to Spine-related procedures, such as failure to decrease pain; infection (i.e.: Meningitis, epidural or intraspinal abscess); bleeding (i.e.: epidural hematoma, subarachnoid hemorrhage, or any other type of intraspinal or peri-dural bleeding); organ or  nerve damage (i.e.: Any type of peripheral nerve, nerve root, or spinal cord injury) with subsequent damage to sensory, motor, and/or autonomic systems, resulting in permanent pain, numbness, and/or weakness of one or several areas of the body; allergic reactions; (i.e.: anaphylactic reaction); and/or death. Furthermore, the patient was informed of those risks and complications associated with the medications. These include, but are not limited to: allergic reactions (i.e.: anaphylactic or anaphylactoid reaction(s)); adrenal axis suppression; blood sugar elevation that in diabetics may result in ketoacidosis or comma; water retention that in patients with history of congestive heart failure may result in shortness of breath, pulmonary edema, and decompensation with resultant heart failure; weight gain; swelling or edema; medication-induced neural toxicity; particulate matter embolism and blood vessel occlusion with resultant organ, and/or nervous system infarction; and/or aseptic necrosis of one or more joints. Finally, the patient was informed that Medicine is not an exact science; therefore, there is also the possibility of unforeseen or unpredictable risks and/or possible complications that may result in a catastrophic outcome. The patient indicated having understood very clearly. We have given the patient no guarantees and we have made no promises. Enough time was given to the patient to ask questions, all of which were answered to the patient's satisfaction. Mary Cruz has indicated that she wanted to continue with the procedure. Attestation: I, the ordering provider, attest that I have  discussed with the patient the benefits, risks, side-effects, alternatives, likelihood of achieving goals, and potential problems during recovery for the procedure that I have provided informed consent. Date: 10/10/2016; Time: 10:59 AM  Pre-Procedure Preparation:  Monitoring: As per clinic protocol. Respiration, ETCO2, SpO2, BP, heart rate and rhythm monitor placed and checked for adequate function Safety Precautions: Patient was assessed for positional comfort and pressure points before starting the procedure. Time-out: I initiated and conducted the "Time-out" before starting the procedure, as per protocol. The patient was asked to participate by confirming the accuracy of the "Time Out" information. Verification of the correct person, site, and procedure were performed and confirmed by me, the nursing staff, and the patient. "Time-out" conducted as per Joint Commission's Universal Protocol (UP.01.01.01). "Time-out" Date & Time: 10/10/2016; 1147 hrs.  Description of Procedure Process:   Position: Prone Target Area: For Lumbar Facet blocks, the target is the groove formed by the junction of the transverse process and superior articular process. For the L5 dorsal ramus, the target is the notch between superior articular process and sacral ala. For the S1 dorsal ramus, the target is the superior and lateral edge of the posterior S1 Sacral foramen. Approach: Paramedial approach. Area Prepped: Entire Posterior Lumbosacral Region Prepping solution: ChloraPrep (2% chlorhexidine gluconate and 70% isopropyl alcohol) Safety Precautions: Aspiration looking for blood return was conducted prior to all injections. At no point did we inject any substances, as a needle was being advanced. No attempts were made at seeking any paresthesias. Safe injection practices and needle disposal techniques used. Medications properly checked for expiration dates. SDV (single dose vial) medications used. Description of the  Procedure: Protocol guidelines were followed. The patient was placed in position over the fluoroscopy table. The target area was identified and the area prepped in the usual manner. Skin desensitized using vapocoolant spray. Skin & deeper tissues infiltrated with local anesthetic. Appropriate amount of time allowed to pass for local anesthetics to take effect. The procedure needle was introduced through the skin, ipsilateral to the reported pain, and advanced to the target area. Employing the "Medial Branch Technique", the needles were  advanced to the angle made by the superior and medial portion of the transverse process, and the lateral and inferior portion of the superior articulating process of the targeted vertebral bodies. This area is known as "Burton's Eye" or the "Eye of the [place] Dog". A procedure needle was introduced through the skin, and this time advanced to the angle made by the superior and medial border of the sacral ala, and the lateral border of the S1 vertebral body. This last needle was later repositioned at the superior and lateral border of the posterior S1 foramen. Negative aspiration confirmed. Solution injected in intermittent fashion, asking for systemic symptoms every 0.5cc of injectate. The needles were then removed and the area cleansed, making sure to leave some of the prepping solution back to take advantage of its long term bactericidal properties.   Illustration of the posterior view of the lumbar spine and the posterior neural structures. Laminae of L2 through S1 are labeled. DPRL5, dorsal primary ramus of L5; DPRS1, dorsal primary ramus of S1; DPR3, dorsal primary ramus of L3; FJ, facet (zygapophyseal) joint L3-L4; I, inferior articular process of L4; LB1, lateral branch of dorsal primary ramus of L1; IAB, inferior articular branches from L3 medial branch (supplies L4-L5 facet joint); IBP, intermediate branch plexus; MB3, medial branch of dorsal primary ramus of L3; NR3,  third lumbar nerve root; S, superior articular process of L5; SAB, superior articular branches from L4 (supplies L4-5 facet joint also); TP3, transverse process of L3.  Vitals:   10/10/16 1205 10/10/16 1215 10/10/16 1225 10/10/16 1235  BP: 132/83 110/69 124/68 121/68  Pulse: 81 80 79 83  Resp: 16 16 16 16   Temp: 98.9 F (37.2 C)     SpO2: 96% 96% 97% 98%  Weight:      Height:        Start Time: 1147 hrs. End Time: 1154 hrs. Materials:  Needle(s) Type: Regular needle Gauge: 22G Length: 3.5-in Medication(s): We administered lactated ringers, midazolam, fentaNYL, triamcinolone acetonide, lidocaine, and ropivacaine (PF) 2 mg/mL (0.2%). Please see chart orders for dosing details.  Imaging Guidance (Spinal):  Type of Imaging Technique: Fluoroscopy Guidance (Spinal) Indication(s): Assistance in needle guidance and placement for procedures requiring needle placement in or near specific anatomical locations not easily accessible without such assistance. Exposure Time: Please see nurses notes. Contrast: None used. Fluoroscopic Guidance: I was personally present during the use of fluoroscopy. "Tunnel Vision Technique" used to obtain the best possible view of the target area. Parallax error corrected before commencing the procedure. "Direction-depth-direction" technique used to introduce the needle under continuous pulsed fluoroscopy. Once target was reached, antero-posterior, oblique, and lateral fluoroscopic projection used confirm needle placement in all planes. Images permanently stored in EMR. Interpretation: No contrast injected. I personally interpreted the imaging intraoperatively. Adequate needle placement confirmed in multiple planes. Permanent images saved into the patient's record.  Antibiotic Prophylaxis:  Indication(s): None identified Antibiotic given: None  Post-operative Assessment:  EBL: None Complications: No immediate post-treatment complications observed by team, or  reported by patient. Note: The patient tolerated the entire procedure well. A repeat set of vitals were taken after the procedure and the patient was kept under observation following institutional policy, for this type of procedure. Post-procedural neurological assessment was performed, showing return to baseline, prior to discharge. The patient was provided with post-procedure discharge instructions, including a section on how to identify potential problems. Should any problems arise concerning this procedure, the patient was given instructions to immediately contact us, at any time,  without hesitation. In any case, we plan to contact the patient by telephone for a follow-up status report regarding this interventional procedure. Comments:  No additional relevant information.  Plan of Care  Disposition: Discharge home  Discharge Date & Time: 10/10/2016; 1236 hrs.   Physician-requested Follow-up:  Return for post-procedure eval by Dr. Dossie Arbour in 2 weeks.  Future Appointments Date Time Provider [place]  10/17/2016 1:00 PM Emeterio Reeve, DO [facility] None  11/13/2016 1:30 PM Milinda Pointer, MD [facility] None  11/18/2016 11:30 AM Vevelyn Francois, NP [facility] None    Imaging Orders     DG C-Arm 1-60 Min-No Report  Procedure Orders     LUMBAR FACET(MEDIAL BRANCH NERVE BLOCK) MBNB  Medications ordered for procedure: Meds ordered this encounter  Medications  . pregabalin (LYRICA) 150 MG capsule    Sig: Take 1 capsule (150 mg total) by mouth every 8 (eight) hours.    Dispense:  90 capsule    Refill:  2    Do not place medication on "Automatic Refill". Fill one day early if pharmacy is closed on scheduled refill date.  . lactated ringers infusion 1,000 mL  . DISCONTD: midazolam (VERSED) 5 MG/5ML injection 1-2 mg    Make sure Flumazenil is available in the pyxis when using this medication. If oversedation occurs, administer 0.2 mg IV over 15 sec. If after 45 sec no response,  administer 0.2 mg again over 1 min; may repeat at 1 min intervals; not to exceed 4 doses (1 mg)  . DISCONTD: fentaNYL (SUBLIMAZE) injection 25-50 mcg    Make sure Narcan is available in the pyxis when using this medication. In the event of respiratory depression (RR< 8/min): Titrate NARCAN (naloxone) in increments of 0.1 to 0.2 mg IV at 2-3 minute intervals, until desired degree of reversal.  . triamcinolone acetonide (KENALOG-40) injection 40 mg  . lidocaine (XYLOCAINE) 2 % (with pres) injection 200 mg  . ropivacaine (PF) 2 mg/mL (0.2%) (NAROPIN) injection 9 mL   Medications administered: We administered lactated ringers, midazolam, fentaNYL, triamcinolone acetonide, lidocaine, and ropivacaine (PF) 2 mg/mL (0.2%).  See the medical record for exact dosing, route, and time of administration.  New Prescriptions   No medications on file   Primary Care Physician: System, Provider Not In Location: [facility] Note by: Gaspar Cola, MD Date: 10/10/2016; Time: 1:11 PM  Disclaimer:  Medicine is not an Chief Strategy Officer. The only guarantee in medicine is that nothing is guaranteed. It is important to note that the decision to proceed with this intervention was based on the information collected from the patient. The Data and conclusions were drawn from the patient's questionnaire, the interview, and the physical examination. Because the information was provided in large part by the patient, it cannot be guaranteed that it has not been purposely or unconsciously manipulated. Every effort has been made to obtain as much relevant data as possible for this evaluation. It is important to note that the conclusions that lead to this procedure are derived in large part from the available data. Always take into account that the treatment will also be dependent on availability of resources and existing treatment guidelines, considered by other Pain Management Practitioners as being  common knowledge and practice, at the time of the intervention. For Medico-Legal purposes, it is also important to point out that variation in procedural techniques and pharmacological choices are the acceptable norm. The indications, contraindications, technique, and results of the above procedure should only be  interpreted and judged by a Board-Certified Interventional Pain Specialist with extensive familiarity and expertise in the same exact procedure and technique.

## 2016-10-10 NOTE — Addendum Note (Signed)
Addended by: Milinda Pointer A on: 10/10/2016 05:14 PM   Modules accepted: Orders

## 2016-10-10 NOTE — Progress Notes (Signed)
Safety precautions to be maintained throughout the outpatient stay will include: orient to surroundings, keep bed in low position, maintain call bell within reach at all times, provide assistance with transfer out of bed and ambulation.  

## 2016-10-10 NOTE — Patient Instructions (Signed)

## 2016-10-11 ENCOUNTER — Telehealth: Payer: Self-pay

## 2016-10-11 NOTE — Telephone Encounter (Signed)
No answer. Left message to call us if needed.

## 2016-10-14 ENCOUNTER — Other Ambulatory Visit: Payer: Self-pay | Admitting: Sports Medicine

## 2016-10-17 ENCOUNTER — Ambulatory Visit: Payer: BLUE CROSS/BLUE SHIELD | Admitting: Osteopathic Medicine

## 2016-10-17 DIAGNOSIS — Z0189 Encounter for other specified special examinations: Secondary | ICD-10-CM

## 2016-10-24 ENCOUNTER — Ambulatory Visit (INDEPENDENT_AMBULATORY_CARE_PROVIDER_SITE_OTHER): Payer: BLUE CROSS/BLUE SHIELD | Admitting: Osteopathic Medicine

## 2016-10-24 ENCOUNTER — Encounter: Payer: Self-pay | Admitting: Osteopathic Medicine

## 2016-10-24 VITALS — BP 139/84 | HR 86 | Ht 64.0 in | Wt 263.0 lb

## 2016-10-24 DIAGNOSIS — Z8349 Family history of other endocrine, nutritional and metabolic diseases: Secondary | ICD-10-CM | POA: Insufficient documentation

## 2016-10-24 DIAGNOSIS — R5383 Other fatigue: Secondary | ICD-10-CM | POA: Diagnosis not present

## 2016-10-24 DIAGNOSIS — G894 Chronic pain syndrome: Secondary | ICD-10-CM | POA: Diagnosis not present

## 2016-10-24 DIAGNOSIS — R635 Abnormal weight gain: Secondary | ICD-10-CM

## 2016-10-24 DIAGNOSIS — R946 Abnormal results of thyroid function studies: Secondary | ICD-10-CM | POA: Diagnosis not present

## 2016-10-24 DIAGNOSIS — K219 Gastro-esophageal reflux disease without esophagitis: Secondary | ICD-10-CM | POA: Diagnosis not present

## 2016-10-24 HISTORY — DX: Family history of other endocrine, nutritional and metabolic diseases: Z83.49

## 2016-10-24 MED ORDER — OMEPRAZOLE 40 MG PO CPDR: 40.0000 mg | DELAYED_RELEASE_CAPSULE | Freq: Every day | ORAL | 3 refills | Status: DC

## 2016-10-24 MED ORDER — OMEPRAZOLE 40 MG PO CPDR: 40.0000 mg | DELAYED_RELEASE_CAPSULE | Freq: Two times a day (BID) | ORAL | 0 refills | Status: DC

## 2016-10-24 NOTE — Patient Instructions (Signed)
Plan:  Labs today  If labs normal, will plan to see you back when due for annual physical - let's make this sometime in the next 6 months or so   Will request records for Dr. Annamaria Boots

## 2016-10-24 NOTE — Progress Notes (Signed)
HPI: Mary Cruz is a 41 y.o. female  who presents to [facility] today, 10/24/16,  for chief complaint of:  Chief Complaint  Patient presents with  . Establish Care    SWITCH FROM HOMMEL/ DISCUSS THYROID CONCERN    Recently seen by Rheum - second opinion, initially was told may have Lupus but further testing by new provider made this diagnosis unlikely. Dr. Annamaria Boots also mentioned that thyroid might be something worth looking into and patient requests thyroid testing today, (+)FH hypothyroid in several family members. Reports previously abn thyroid studies though we don't have records available.   Swelling in LE has resolved, pt not concerned anymore about this.   Chronic pain: following with pain management for opiate Rx.  HTN: stable on current medications    GERD: better now back on PPI but Is at high dose, previously following with GI in [place], doesn't want another EGD at this point     Past medical, surgical, social and family history reviewed: Patient Active Problem List   Diagnosis Date Noted  . Neurogenic pain 10/10/2016  . Fibromyalgia 10/10/2016  . Spinal cord stimulator status 05/16/2016  . Battery end of life of spinal cord stimulator 05/16/2016  . Positive ANA (antinuclear antibody) 09/13/2015  . Lumbar spondylosis 06/29/2015  . Lumbar facet syndrome (Bilateral) (R>L) 06/29/2015  . Implant pain at battery site (right buttocks area) 04/05/2015  . Anxiety and depression 01/20/2015  . Right knee pain 01/10/2015  . Long term current use of opiate analgesic 12/19/2014  . Long term prescription opiate use 12/19/2014  . Opiate use (30 MME/Day) 12/19/2014  . Opiate dependence ([place]) 12/19/2014  . Encounter for therapeutic drug level monitoring 12/19/2014  . Musculoskeletal pain 12/19/2014  . Muscle cramping 12/19/2014  . Osteoarthrosis 12/19/2014  . Vitamin D insufficiency 12/19/2014  . Presence of functional implant ([employer]  Lumbar spinal cord stimulator implant) 12/19/2014  . Encounter for management of implanted device 12/19/2014  . Chronic low back pain (Bilateral) (R>L) 12/19/2014  . Chronic lumbar radicular pain (Right L5 dermatome; Left S1 Dermatome) (Bilateral) (R>L) 12/19/2014  . Chronic hip pain (Location of Tertiary source of pain) (Bilateral) (R>L) 12/19/2014  . Chronic neck pain (Location of Primary Source of Pain) (Bilateral) (L>R) 12/19/2014  . Chronic upper extremity pain (Location of Secondary source of pain) (Bilateral) (L>R) 12/19/2014  . Morbid obesity ([place]) 12/19/2014  . Nicotine dependence 12/19/2014  . Chronic cervical radicular pain (C7 Dermatome) (Bilateral) (L>R) 12/19/2014  . Chronic lower extremity pain (Bilateral) (R>L) 12/19/2014  . Obesity 10/27/2014  . Generalized anxiety disorder 03/16/2014  . Essential hypertension, benign 07/09/2013  . Chronic pain syndrome 07/09/2013  . GERD 07/02/2007  . Irritable bowel syndrome 07/02/2007  . KIDNEY STONES 07/02/2007   Past Surgical History:  Procedure Laterality Date  . BREAST SURGERY    . CHOLECYSTECTOMY  2000  . ENDOMETRIAL ABLATION    . KNEE SURGERY    . KNEE SURGERY Right 02-2015  . OCCIPITAL NERVE STIMULATOR INSERTION    . SHOULDER SURGERY    . spinal cord stimulator    . SPINAL CORD STIMULATOR IMPLANT  2008  . SPINAL CORD STIMULATOR INSERTION N/A 06/21/2016   Procedure: LUMBAR SPINAL CORD STIMULATOR INSERTION;  Surgeon: Clydell Hakim, MD;  Location: [facility];  Service: Neurosurgery;  Laterality: N/A;  LUMBAR SPINAL CORD STIMULATOR INSERTION  . TUBAL LIGATION  2000   Social History  Substance Use Topics  . Smoking status: Current Every Day Smoker  Types: Cigarettes    Last attempt to quit: 08/01/2015  . Smokeless tobacco: Never Used  . Alcohol use No   Family History  Problem Relation Age of Onset  . Depression Mother        maternal grandmother  . Asthma Mother   . Fibromyalgia Unknown        aunt  . Stroke Unknown         grandmother  . Thyroid disease Unknown        grandmother     Current medication list and allergy/intolerance information reviewed:   Current Outpatient Prescriptions  Medication Sig Dispense Refill  . albuterol (PROVENTIL HFA;VENTOLIN HFA) 108 (90 Base) MCG/ACT inhaler Inhale 1-2 puffs into the lungs every 6 (six) hours as needed for wheezing or shortness of breath. 1 Inhaler 0  . amitriptyline (ELAVIL) 100 MG tablet TAKE 1 TABLET (100 MG TOTAL) BY MOUTH AT BEDTIME. 90 tablet 0  . Cholecalciferol (VITAMIN D3) 2000 units TABS Take 1 tablet by mouth daily.    . DULoxetine (CYMBALTA) 60 MG capsule Take 1 capsule (60 mg total) by mouth 2 (two) times daily. 180 capsule 1  . lisinopril-hydrochlorothiazide (PRINZIDE,ZESTORETIC) 10-12.5 MG tablet Take 0.5 tablets by mouth daily. 45 tablet 1  . metaxalone (SKELAXIN) 800 MG tablet Take 1 tablet (800 mg total) by mouth 3 (three) times daily. 270 tablet 0  . omeprazole (PRILOSEC) 40 MG capsule Take 1 capsule (40 mg total) by mouth daily. 90 capsule 3  . oxyCODONE (OXY IR/ROXICODONE) 5 MG immediate release tablet Take 1 tablet (5 mg total) by mouth every 6 (six) hours as needed for severe pain. 120 tablet 0  . pregabalin (LYRICA) 150 MG capsule Take 1 capsule (150 mg total) by mouth every 8 (eight) hours. 90 capsule 2   No current facility-administered medications for this visit.    Allergies  Allergen Reactions  . Keflex [Cephalexin] Other (See Comments)    Nausea and causes uti's  . Amoxicillin Diarrhea and Nausea And Vomiting  . Celebrex [Celecoxib] Swelling    "Body swelling"  . Pentazocine Lactate Itching and Other (See Comments)    HALLUCINATIONS  . Bactrim [Sulfamethoxazole-Trimethoprim] Other (See Comments)    "Makes very sick"  . Doxycycline     UNSPECIFIED REACTION  "SICKNESS"  . Erythromycin Other (See Comments)    RESULTANT UTI  . Hydromorphone Hcl Other (See Comments)    HALLUCINATIONS  . Latex Itching  . Nabumetone  Itching      Review of Systems:  Constitutional:  No  fever, no chills, No recent illness, +unintentional weight changes. +significant fatigue.   HEENT: No  headache, no vision change  Cardiac: No  chest pain, No  pressure, No palpitations  Respiratory:  No  shortness of breath. No  Cough  Gastrointestinal: No  abdominal pain, No  nausea, No  vomiting   Musculoskeletal: No new myalgia/arthralgia  Skin: No  Rash  Endocrine: +cold intolerance,  No heat intolerance. No polyuria/polydipsia/polyphagia   Neurologic: No  weakness, No  dizziness  Psychiatric: No  concerns with depression, No  concerns with anxiety, No sleep problems, No mood problems  Exam:  BP 139/84   Pulse 86   Ht 5\' 4"  (1.626 m)   Wt 263 lb (119.3 kg)   LMP 09/25/2016   BMI 45.14 kg/m   Constitutional: VS see above. General Appearance: alert, well-developed, well-nourished, NAD  Eyes: Normal lids and conjunctive, non-icteric sclera  Ears, Nose, Mouth, Throat: MMM, Normal external inspection ears/nares/mouth/lips/gums.  TM normal bilaterally.   Neck: No masses, trachea midline. No thyroid enlargement. No tenderness/mass appreciated. No lymphadenopathy   Respiratory: Normal respiratory effort. no wheeze, no rhonchi, no rales  Cardiovascular: S1/S2 normal, no murmur, no rub/gallop auscultated. RRR. No lower extremity edema.   Musculoskeletal: Gait normal. No clubbing/cyanosis of digits.   Neurological: Normal balance/coordination. No tremor.   Skin: warm, dry, intact. No rash/ulcer.   Psychiatric: Normal judgment/insight. Normal mood and affect. Oriented x3.     ASSESSMENT/PLAN:   Thyroid function study abnormality - Plan: TSH, T4, free, COMPLETE METABOLIC PANEL WITH GFR, CBC, VITAMIN D 25 Hydroxy (Vit-D Deficiency, Fractures), T3, free  Weight gain - Plan: TSH, T4, free, COMPLETE METABOLIC PANEL WITH GFR, CBC, VITAMIN D 25 Hydroxy (Vit-D Deficiency, Fractures), T3, free  Fatigue, unspecified  type - Plan: TSH, T4, free, COMPLETE METABOLIC PANEL WITH GFR, CBC, VITAMIN D 25 Hydroxy (Vit-D Deficiency, Fractures), T3, free  Family history of thyroid disease  Chronic pain syndrome  Gastroesophageal reflux disease, esophagitis presence not specified - advise trial decrease dose PPI, declines H2B    Patient Instructions  Plan:  Labs today  If labs normal, will plan to see you back when due for annual physical - let's make this sometime in the next 6 months or so   Will request records for Dr. Annamaria Boots     Visit summary with medication list and pertinent instructions was printed for patient to review. All questions at time of visit were answered - patient instructed to contact office with any additional concerns. ER/RTC precautions were reviewed with the patient. Follow-up plan: Return for annual physical, sooner if needed.  Note: Total time spent 25 minutes, greater than 50% of the visit was spent face-to-face counseling and coordinating care for the following: The primary encounter diagnosis was Thyroid function study abnormality. Diagnoses of Weight gain, Fatigue, unspecified type, Family history of thyroid disease, Chronic pain syndrome, and Gastroesophageal reflux disease, esophagitis presence not specified were also pertinent to this visit.[name]

## 2016-10-25 ENCOUNTER — Other Ambulatory Visit: Payer: Self-pay | Admitting: Family Medicine

## 2016-10-25 DIAGNOSIS — R300 Dysuria: Secondary | ICD-10-CM

## 2016-10-25 LAB — CBC
HEMATOCRIT: 35.6 % (ref 35.0–45.0)
HEMOGLOBIN: 11.8 g/dL (ref 11.7–15.5)
MCH: 28.2 pg (ref 27.0–33.0)
MCHC: 33.1 g/dL (ref 32.0–36.0)
MCV: 85.2 fL (ref 80.0–100.0)
MPV: 11.7 fL (ref 7.5–12.5)
Platelets: 285 10*3/uL (ref 140–400)
RBC: 4.18 10*6/uL (ref 3.80–5.10)
RDW: 15 % (ref 11.0–15.0)
WBC: 7.3 10*3/uL (ref 3.8–10.8)

## 2016-10-25 LAB — COMPLETE METABOLIC PANEL WITH GFR
AG RATIO: 1.3 (calc) (ref 1.0–2.5)
ALT: 16 U/L (ref 6–29)
AST: 18 U/L (ref 10–30)
Albumin: 4 g/dL (ref 3.6–5.1)
Alkaline phosphatase (APISO): 108 U/L (ref 33–115)
BUN: 16 mg/dL (ref 7–25)
CALCIUM: 9.4 mg/dL (ref 8.6–10.2)
CO2: 21 mmol/L (ref 20–32)
CREATININE: 1.01 mg/dL (ref 0.50–1.10)
Chloride: 103 mmol/L (ref 98–110)
GFR, EST AFRICAN AMERICAN: 81 mL/min/{1.73_m2} (ref >=60)
GFR, EST NON AFRICAN AMERICAN: 70 mL/min/{1.73_m2} (ref >=60)
Globulin: 3.1 g/dL (calc) (ref 1.9–3.7)
Glucose, Bld: 89 mg/dL (ref 65–99)
POTASSIUM: 4 mmol/L (ref 3.5–5.3)
Sodium: 138 mmol/L (ref 135–146)
TOTAL PROTEIN: 7.1 g/dL (ref 6.1–8.1)
Total Bilirubin: 0.3 mg/dL (ref 0.2–1.2)

## 2016-10-25 LAB — VITAMIN D 25 HYDROXY (VIT D DEFICIENCY, FRACTURES): Vit D, 25-Hydroxy: 25 ng/mL — ABNORMAL LOW (ref 30–100)

## 2016-10-25 LAB — T4, FREE: Free T4: 1.1 ng/dL (ref 0.8–1.8)

## 2016-10-25 LAB — T3, FREE: T3, Free: 3.3 pg/mL (ref 2.3–4.2)

## 2016-10-25 LAB — TSH: TSH: 2.63 mIU/L

## 2016-11-13 ENCOUNTER — Ambulatory Visit: Payer: BLUE CROSS/BLUE SHIELD | Admitting: Pain Medicine

## 2016-11-18 ENCOUNTER — Ambulatory Visit: Payer: BLUE CROSS/BLUE SHIELD | Admitting: Nurse Practitioner

## 2016-11-21 ENCOUNTER — Encounter: Payer: Self-pay | Admitting: Nurse Practitioner

## 2016-11-21 ENCOUNTER — Ambulatory Visit: Payer: BLUE CROSS/BLUE SHIELD | Attending: Nurse Practitioner | Admitting: Nurse Practitioner

## 2016-11-21 VITALS — BP 144/82 | HR 97 | Temp 97.8°F | Resp 18 | Ht 64.0 in | Wt 240.0 lb

## 2016-11-21 DIAGNOSIS — M25561 Pain in right knee: Secondary | ICD-10-CM | POA: Insufficient documentation

## 2016-11-21 DIAGNOSIS — Z5181 Encounter for therapeutic drug level monitoring: Secondary | ICD-10-CM | POA: Diagnosis not present

## 2016-11-21 DIAGNOSIS — F17210 Nicotine dependence, cigarettes, uncomplicated: Secondary | ICD-10-CM | POA: Insufficient documentation

## 2016-11-21 DIAGNOSIS — E559 Vitamin D deficiency, unspecified: Secondary | ICD-10-CM | POA: Insufficient documentation

## 2016-11-21 DIAGNOSIS — M25552 Pain in left hip: Secondary | ICD-10-CM

## 2016-11-21 DIAGNOSIS — F411 Generalized anxiety disorder: Secondary | ICD-10-CM | POA: Insufficient documentation

## 2016-11-21 DIAGNOSIS — R252 Cramp and spasm: Secondary | ICD-10-CM

## 2016-11-21 DIAGNOSIS — K589 Irritable bowel syndrome without diarrhea: Secondary | ICD-10-CM | POA: Insufficient documentation

## 2016-11-21 DIAGNOSIS — Z4549 Encounter for adjustment and management of other implanted nervous system device: Secondary | ICD-10-CM | POA: Diagnosis not present

## 2016-11-21 DIAGNOSIS — G8929 Other chronic pain: Secondary | ICD-10-CM

## 2016-11-21 DIAGNOSIS — M79605 Pain in left leg: Secondary | ICD-10-CM

## 2016-11-21 DIAGNOSIS — M7918 Myalgia, other site: Secondary | ICD-10-CM

## 2016-11-21 DIAGNOSIS — J45901 Unspecified asthma with (acute) exacerbation: Secondary | ICD-10-CM | POA: Insufficient documentation

## 2016-11-21 DIAGNOSIS — J209 Acute bronchitis, unspecified: Secondary | ICD-10-CM | POA: Diagnosis not present

## 2016-11-21 DIAGNOSIS — Z825 Family history of asthma and other chronic lower respiratory diseases: Secondary | ICD-10-CM | POA: Insufficient documentation

## 2016-11-21 DIAGNOSIS — M25551 Pain in right hip: Secondary | ICD-10-CM

## 2016-11-21 DIAGNOSIS — M5412 Radiculopathy, cervical region: Secondary | ICD-10-CM | POA: Diagnosis not present

## 2016-11-21 DIAGNOSIS — Z8349 Family history of other endocrine, nutritional and metabolic diseases: Secondary | ICD-10-CM | POA: Insufficient documentation

## 2016-11-21 DIAGNOSIS — M79604 Pain in right leg: Secondary | ICD-10-CM | POA: Diagnosis not present

## 2016-11-21 DIAGNOSIS — M47816 Spondylosis without myelopathy or radiculopathy, lumbar region: Secondary | ICD-10-CM

## 2016-11-21 DIAGNOSIS — Z818 Family history of other mental and behavioral disorders: Secondary | ICD-10-CM | POA: Insufficient documentation

## 2016-11-21 DIAGNOSIS — M1990 Unspecified osteoarthritis, unspecified site: Secondary | ICD-10-CM | POA: Diagnosis not present

## 2016-11-21 DIAGNOSIS — Z79899 Other long term (current) drug therapy: Secondary | ICD-10-CM | POA: Insufficient documentation

## 2016-11-21 DIAGNOSIS — Z9049 Acquired absence of other specified parts of digestive tract: Secondary | ICD-10-CM | POA: Insufficient documentation

## 2016-11-21 DIAGNOSIS — G894 Chronic pain syndrome: Secondary | ICD-10-CM

## 2016-11-21 DIAGNOSIS — E669 Obesity, unspecified: Secondary | ICD-10-CM | POA: Insufficient documentation

## 2016-11-21 DIAGNOSIS — M797 Fibromyalgia: Secondary | ICD-10-CM | POA: Diagnosis not present

## 2016-11-21 DIAGNOSIS — I10 Essential (primary) hypertension: Secondary | ICD-10-CM | POA: Insufficient documentation

## 2016-11-21 DIAGNOSIS — Z79891 Long term (current) use of opiate analgesic: Secondary | ICD-10-CM

## 2016-11-21 DIAGNOSIS — M792 Neuralgia and neuritis, unspecified: Secondary | ICD-10-CM

## 2016-11-21 DIAGNOSIS — F329 Major depressive disorder, single episode, unspecified: Secondary | ICD-10-CM | POA: Insufficient documentation

## 2016-11-21 DIAGNOSIS — K219 Gastro-esophageal reflux disease without esophagitis: Secondary | ICD-10-CM | POA: Diagnosis not present

## 2016-11-21 DIAGNOSIS — Z823 Family history of stroke: Secondary | ICD-10-CM | POA: Insufficient documentation

## 2016-11-21 DIAGNOSIS — Z888 Allergy status to other drugs, medicaments and biological substances status: Secondary | ICD-10-CM | POA: Insufficient documentation

## 2016-11-21 DIAGNOSIS — M329 Systemic lupus erythematosus, unspecified: Secondary | ICD-10-CM | POA: Diagnosis not present

## 2016-11-21 DIAGNOSIS — M545 Low back pain, unspecified: Secondary | ICD-10-CM

## 2016-11-21 DIAGNOSIS — Z880 Allergy status to penicillin: Secondary | ICD-10-CM | POA: Diagnosis not present

## 2016-11-21 DIAGNOSIS — Z9889 Other specified postprocedural states: Secondary | ICD-10-CM | POA: Insufficient documentation

## 2016-11-21 MED ORDER — OXYCODONE HCL 5 MG PO TABS: 5.0000 mg | ORAL_TABLET | Freq: Four times a day (QID) | ORAL | 0 refills | Status: DC | PRN

## 2016-11-21 MED ORDER — OXYCODONE HCL 5 MG PO TABS: 5.0000 mg | ORAL_TABLET | ORAL | 0 refills | Status: DC | PRN

## 2016-11-21 MED ORDER — PREGABALIN 150 MG PO CAPS: 150.0000 mg | ORAL_CAPSULE | Freq: Three times a day (TID) | ORAL | 2 refills | Status: DC

## 2016-11-21 MED ORDER — METAXALONE 800 MG PO TABS: 800.0000 mg | ORAL_TABLET | Freq: Three times a day (TID) | ORAL | 0 refills | Status: DC

## 2016-11-21 NOTE — Progress Notes (Signed)
Patient's Name: Mary Cruz  MRN: [id number]  Referring Provider: No ref. provider found  DOB: 02-09-1976  PCP: System, Provider Not In  DOS: 11/21/2016  Note by: Vevelyn Francois NP  Service setting: Ambulatory outpatient  Specialty: Interventional Pain Management  Location: [facility] (AMB) Pain Management Facility    Patient type: Established    Primary Reason(s) for Visit: Encounter for prescription drug management & post-procedure evaluation of chronic illness with mild to moderate exacerbation(Level of risk: moderate) CC: Back Pain (low); Hip Pain (bilateral); and Neck Pain  HPI  Mary Cruz is a 41 y.o. year old, female patient, who comes today for a post-procedure evaluation and medication management. She has GERD; Irritable bowel syndrome; KIDNEY STONES; Essential hypertension, benign; Chronic pain syndrome; Generalized anxiety disorder; Obesity; Long term current use of opiate analgesic; Long term prescription opiate use; Opiate use (30 MME/Day); Opiate dependence ([place]); Encounter for therapeutic drug level monitoring; Musculoskeletal pain; Muscle cramping; Osteoarthrosis; Vitamin D insufficiency; Presence of functional implant ([employer] Lumbar spinal cord stimulator implant); Encounter for management of implanted device; Chronic low back pain (Bilateral) (R>L); Chronic lumbar radicular pain (Right L5 dermatome; Left S1 Dermatome) (Bilateral) (R>L); Chronic hip pain (Location of Tertiary source of pain) (Bilateral) (R>L); Chronic neck pain (Location of Primary Source of Pain) (Bilateral) (L>R); Chronic upper extremity pain (Location of Secondary source of pain) (Bilateral) (L>R); Morbid obesity ([place]); Nicotine dependence; Chronic cervical radicular pain (C7 Dermatome) (Bilateral) (L>R); Chronic lower extremity pain (Bilateral) (R>L); Right knee pain; Anxiety and depression; Implant pain at battery site (right buttocks area); Lumbar spondylosis; Lumbar facet syndrome (Bilateral) (R>L); Positive  ANA (antinuclear antibody); Spinal cord stimulator status; Battery end of life of spinal cord stimulator; Neurogenic pain; Fibromyalgia; and Family history of thyroid disease on her problem list. Her primarily concern today is the Back Pain (low); Hip Pain (bilateral); and Neck Pain  Pain Assessment: Location: Lower Back Radiating: hips Onset: More than a month ago Duration: Chronic pain Quality: Aching, Burning, Constant Severity: 5 /10 (self-reported pain score)  Note: Reported level is compatible with observation.                   When using our objective Pain Scale, levels between 6 and 10/10 are said to belong in an emergency room, as it progressively worsens from a 6/10, described as severely limiting, requiring emergency care not usually available at an outpatient pain management facility. At a 6/10 level, communication becomes difficult and requires great effort. Assistance to reach the emergency department may be required. Facial flushing and profuse sweating along with potentially dangerous increases in heart rate and blood pressure will be evident. Effect on ADL:   Timing: Constant Modifying factors: meds, procedures  Mary Cruz was last seen on 10/02/2016 for a procedure. During today's appointment we reviewed Mary Cruz post-procedure results, as well as her outpatient medication regimen.  Further details on both, my assessment(s), as well as the proposed treatment plan, please see below.  Controlled Substance Pharmacotherapy Assessment REMS (Risk Evaluation and Mitigation Strategy)  Analgesic:Oxycodone IR 5 mg every 6 hours when necessary for pain. (20 mg/day) MME/day:30 mg/day. Hart Rochester, RN  11/21/2016  1:46 PM  Sign at close encounter Nursing Pain Medication Assessment:  Safety precautions to be maintained throughout the outpatient stay will include: orient to surroundings, keep bed in low position, maintain call bell within reach at all times, provide  assistance with transfer out of bed and ambulation.  Medication Inspection Compliance: Pill count conducted under  aseptic conditions, in front of the patient. Neither the pills nor the bottle was removed from the patient's sight at any time. Once count was completed pills were immediately returned to the patient in their original bottle.  Medication: Oxycodone IR Pill/Patch Count: 114 of 120 pills remain Pill/Patch Appearance: Markings consistent with prescribed medication Bottle Appearance: Standard pharmacy container. Clearly labeled. Filled Date: 10 / 04 / 2018 Last Medication intake:  Today   Pharmacokinetics: Liberation and absorption (onset of action): WNL Distribution (time to peak effect): WNL Metabolism and excretion (duration of action): WNL         Pharmacodynamics: Desired effects: Analgesia: Mary Cruz reports >50% benefit. Functional ability: Patient reports that medication allows her to accomplish basic ADLs Clinically meaningful improvement in function (CMIF): Sustained CMIF goals met Perceived effectiveness: Described as relatively effective, allowing for increase in activities of daily living (ADL) Undesirable effects: Side-effects or Adverse reactions: None reported Monitoring: [place] PMP: Online review of the past 48-monthperiod conducted. Compliant with practice rules and regulations Last UDS on record: Summary  Date Value Ref Range Status  08/19/2016 FINAL  Final    Comment:    ==================================================================== TOXASSURE SELECT 13 (MW) ==================================================================== Test                             Result       Flag       Units Drug Absent but Declared for Prescription Verification   Oxycodone                      Not Detected UNEXPECTED ng/mg creat ==================================================================== Test                      Result    Flag   Units      Ref Range    Creatinine              85               mg/dL      >=20 ==================================================================== Declared Medications:  The flagging and interpretation on this report are based on the  following declared medications.  Unexpected results may arise from  inaccuracies in the declared medications.  **Note: The testing scope of this panel includes these medications:  Oxycodone  **Note: The testing scope of this panel does not include following  reported medications:  Albuterol  Amitriptyline (Elavil)  Duloxetine (Cymbalta)  Hydrochlorothiazide (Lisinopril-HCTZ)  Hydroxychloroquine (Plaquenil)  Lisinopril (Lisinopril-HCTZ)  Metaxalone (Skelaxin)  Omeprazole  Phenazopyridine (Pyridium)  Pregabalin (Lyrica)  Tamsulosin (Flomax)  Vitamin D3 ==================================================================== For clinical consultation, please call ([phone] ====================================================================    UDS interpretation: Non-Compliant Today I reminded Ms. KRettingerthat accuracy during medication reconciliation is of paramount importance. Medication Assessment Form: Reviewed. Patient indicates being compliant with therapy Treatment compliance: Deficiencies noted and steps taken to remind the patient of the seriousness of adequate therapy compliance Risk Assessment Profile: Aberrant behavior: See prior evaluations. None observed or detected today Comorbid factors increasing risk of overdose: See prior notes. No additional risks detected today Risk of substance use disorder (SUD): Low     Opioid Risk Tool - 10/02/16 1158      Family History of Substance Abuse   Alcohol Negative   Illegal Drugs Negative   Rx Drugs Negative     Personal History of Substance Abuse   Alcohol Negative   Illegal Drugs Negative  Rx Drugs Negative     Age   Age between 64-45 years  Yes     History of Preadolescent Sexual Abuse   History of  Preadolescent Sexual Abuse Negative or Female     Psychological Disease   Psychological Disease Negative   Depression Negative     Total Score   Opioid Risk Tool Scoring 1   Opioid Risk Interpretation Low Risk     ORT Scoring interpretation table:  Score <3 = Low Risk for SUD  Score between 4-7 = Moderate Risk for SUD  Score >8 = High Risk for Opioid Abuse   Risk Mitigation Strategies:  Patient Counseling: Covered Patient-Prescriber Agreement (PPA): Present and active  Notification to other healthcare providers: Done  Pharmacologic Plan: No change in therapy, at this time  Post-Procedure Assessment  10/02/2016 Procedure: 10/10/16 left-sided lumbar facet block Pre-procedure pain score:  6/10 Post-procedure pain score: 0/10         Influential Factors: BMI: 41.20 kg/m Intra-procedural challenges: None observed.         Assessment challenges: None detected.              Reported side-effects: None.        Post-procedural adverse reactions or complications: None reported         Sedation: Please see nurses note. When no sedatives are used, the analgesic levels obtained are directly associated to the effectiveness of the local anesthetics. However, when sedation is provided, the level of analgesia obtained during the initial 1 hour following the intervention, is believed to be the result of a combination of factors. These factors may include, but are not limited to: 1. The effectiveness of the local anesthetics used. 2. The effects of the analgesic(s) and/or anxiolytic(s) used. 3. The degree of discomfort experienced by the patient at the time of the procedure. 4. The patients ability and reliability in recalling and recording the events. 5. The presence and influence of possible secondary gains and/or psychosocial factors. Reported result: Relief experienced during the 1st hour after the procedure: 100 % (Ultra-Short Term Relief)            Interpretative annotation: Clinically  appropriate result. Analgesia during this period is likely to be Local Anesthetic and/or IV Sedative (Analgesic/Anxiolytic) related.          Effects of local anesthetic: The analgesic effects attained during this period are directly associated to the localized infiltration of local anesthetics and therefore cary significant diagnostic value as to the etiological location, or anatomical origin, of the pain. Expected duration of relief is directly dependent on the pharmacodynamics of the local anesthetic used. Long-acting (4-6 hours) anesthetics used.  Reported result: Relief during the next 4 to 6 hour after the procedure: 100 % (Short-Term Relief)            Interpretative annotation: Clinically appropriate result. Analgesia during this period is likely to be Local Anesthetic-related.          Long-term benefit: Defined as the period of time past the expected duration of local anesthetics (1 hour for short-acting and 4-6 hours for long-acting). With the possible exception of prolonged sympathetic blockade from the local anesthetics, benefits during this period are typically attributed to, or associated with, other factors such as analgesic sensory neuropraxia, antiinflammatory effects, or beneficial biochemical changes provided by agents other than the local anesthetics.  Reported result: Extended relief following procedure: 100 % (Long-Term Relief)  Interpretative annotation: Clinically appropriate result. Good relief. No permanent benefit expected. Inflammation plays a part in the etiology to the pain.          Current benefits: Defined as reported results that persistent at this point in time.   Analgesia: 90 % Mary Cruz reports improvement of axial symptoms. Function: Mary Cruz reports improvement in function ROM: Mary Cruz reports improvement in ROM Interpretative annotation: Recurrence of symptoms. No permanent benefit expected. Effective diagnostic intervention.           Interpretation: Results would suggest a successful diagnostic intervention.                  Plan:  Please see "Plan of Care" for details.       Laboratory Chemistry  Inflammation Markers (CRP: Acute Phase) (ESR: Chronic Phase) Lab Results  Component Value Date   CRP 1.0 (H) 04/05/2015   ESRSEDRATE 35 (H) 04/05/2015                 Renal Function Markers Lab Results  Component Value Date   BUN 16 10/24/2016   CREATININE 1.01 10/24/2016   GFRAA 81 10/24/2016   GFRNONAA 70 10/24/2016                 Hepatic Function Markers Lab Results  Component Value Date   AST 18 10/24/2016   ALT 16 10/24/2016   ALBUMIN 3.8 07/22/2016   ALKPHOS 96 07/22/2016                 Electrolytes Lab Results  Component Value Date   NA 138 10/24/2016   K 4.0 10/24/2016   CL 103 10/24/2016   CALCIUM 9.4 10/24/2016   MG 2.0 04/05/2015                 Neuropathy Markers No results found for: [id number]               Bone Pathology Markers Lab Results  Component Value Date   ALKPHOS 96 07/22/2016   VD25OH 25 (L) 10/24/2016   CALCIUM 9.4 10/24/2016                 Coagulation Parameters Lab Results  Component Value Date   INR 1.05 06/21/2016   LABPROT 13.8 06/21/2016   APTT 29 06/21/2016   PLT 285 10/24/2016                 Cardiovascular Markers Lab Results  Component Value Date   HGB 11.8 10/24/2016   HCT 35.6 10/24/2016                 Note: Lab results reviewed.  Recent Diagnostic Imaging Results  DG C-Arm 1-60 Min-No Report Fluoroscopy was utilized by the requesting physician.  No radiographic  interpretation.   Complexity Note: Imaging results reviewed. Results shared with Mary Cruz, using Layman's terms.                         Meds   Current Outpatient Prescriptions:  .  albuterol (PROVENTIL HFA;VENTOLIN HFA) 108 (90 Base) MCG/ACT inhaler, Inhale 1-2 puffs into the lungs every 6 (six) hours as needed for wheezing or shortness of breath., Disp: 1 Inhaler,  Rfl: 0 .  amitriptyline (ELAVIL) 100 MG tablet, TAKE 1 TABLET (100 MG TOTAL) BY MOUTH AT BEDTIME., Disp: 90 tablet, Rfl: 0 .  Cholecalciferol (VITAMIN D3) 2000 units TABS, Take 1 tablet by mouth daily., Disp: ,  Rfl:  .  DULoxetine (CYMBALTA) 60 MG capsule, Take 1 capsule (60 mg total) by mouth 2 (two) times daily., Disp: 180 capsule, Rfl: 1 .  lisinopril-hydrochlorothiazide (PRINZIDE,ZESTORETIC) 10-12.5 MG tablet, Take 0.5 tablets by mouth daily., Disp: 45 tablet, Rfl: 1 .  [START ON 12/14/2016] metaxalone (SKELAXIN) 800 MG tablet, Take 1 tablet (800 mg total) by mouth 3 (three) times daily., Disp: 270 tablet, Rfl: 0 .  omeprazole (PRILOSEC) 40 MG capsule, Take 1 capsule (40 mg total) by mouth daily., Disp: 90 capsule, Rfl: 3 .  [START ON 12/14/2016] oxyCODONE (OXY IR/ROXICODONE) 5 MG immediate release tablet, Take 1 tablet (5 mg total) by mouth every 6 (six) hours as needed for severe pain., Disp: 120 tablet, Rfl: 0 .  [START ON 12/14/2016] pregabalin (LYRICA) 150 MG capsule, Take 1 capsule (150 mg total) by mouth every 8 (eight) hours., Disp: 90 capsule, Rfl: 2 .  [START ON 01/13/2017] oxyCODONE (OXY IR/ROXICODONE) 5 MG immediate release tablet, Take 1 tablet (5 mg total) by mouth every 4 (four) hours as needed for severe pain., Disp: 120 tablet, Rfl: 0 .  [START ON 02/12/2017] oxyCODONE (OXY IR/ROXICODONE) 5 MG immediate release tablet, Take 1 tablet (5 mg total) by mouth every 6 (six) hours as needed for severe pain., Disp: 120 tablet, Rfl: 0  ROS  Constitutional: Denies any fever or chills Gastrointestinal: No reported hemesis, hematochezia, vomiting, or acute GI distress Musculoskeletal: Denies any acute onset joint swelling, redness, loss of ROM, or weakness Neurological: No reported episodes of acute onset apraxia, aphasia, dysarthria, agnosia, amnesia, paralysis, loss of coordination, or loss of consciousness  Allergies  Mary Cruz is allergic to keflex [cephalexin]; amoxicillin; celebrex  [celecoxib]; pentazocine lactate; bactrim [sulfamethoxazole-trimethoprim]; doxycycline; erythromycin; hydromorphone hcl; latex; and nabumetone.  [place]  Drug: Mary Cruz  reports that she does not use drugs. Alcohol:  reports that she does not drink alcohol. Tobacco:  reports that she has been smoking Cigarettes.  She has never used smokeless tobacco. Medical:  has a past medical history of Abscess of axillary fold (10/10/2013); Acute bronchitis with asthma with acute exacerbation (01/23/2015); Fibromyalgia; Hypertension; Lupus; Osteoarthritis; Pain; and Sepsis affecting skin ([place]) (10/10/2013). Surgical: Mary Cruz  has a past surgical history that includes spinal cord stimulator; Occipital nerve stimulator insertion; Cholecystectomy (2000); Knee surgery; Shoulder surgery; Tubal ligation (2000); Endometrial ablation; Spinal cord stimulator implant (2008); Knee surgery (Right, 02-2015); Breast surgery; and Spinal cord stimulator insertion (N/A, 06/21/2016). Family: family history includes Asthma in her mother; Depression in her mother; Fibromyalgia in her unknown relative; Stroke in her unknown relative; Thyroid disease in her unknown relative.  Constitutional Exam  General appearance: Well nourished, well developed, and well hydrated. In no apparent acute distress Vitals:   11/21/16 1342  BP: (!) 144/82  Pulse: 97  Resp: 18  Temp: 97.8 F (36.6 C)  TempSrc: Oral  SpO2: 100%  Weight: 240 lb (108.9 kg)  Height: 5' 4"  (1.626 m)   BMI Assessment: Estimated body mass index is 41.2 kg/m as calculated from the following:   Height as of this encounter: 5' 4"  (1.626 m).   Weight as of this encounter: 240 lb (108.9 kg).  BMI interpretation table: BMI level Category Range association with higher incidence of chronic pain  <18 kg/m2 Underweight   18.5-24.9 kg/m2 Ideal body weight   25-29.9 kg/m2 Overweight Increased incidence by 20%  30-34.9 kg/m2 Obese (Class I) Increased incidence by 68%   35-39.9 kg/m2 Severe obesity (Class II) Increased incidence by 136%  >  40 kg/m2 Extreme obesity (Class III) Increased incidence by 254%   BMI Readings from Last 4 Encounters:  11/21/16 41.20 kg/m  10/24/16 45.14 kg/m  10/10/16 46.17 kg/m  10/02/16 42.40 kg/m   Wt Readings from Last 4 Encounters:  11/21/16 240 lb (108.9 kg)  10/24/16 263 lb (119.3 kg)  10/10/16 269 lb (122 kg)  10/02/16 247 lb (112 kg)  Psych/Mental status: Alert, oriented x 3 (person, place, & time)       Eyes: PERLA Respiratory: No evidence of acute respiratory distress  Cervical Spine Area Exam  Skin & Axial Inspection: No masses, redness, edema, swelling, or associated skin lesions Alignment: Symmetrical Functional ROM: Unrestricted ROM      Stability: No instability detected Muscle Tone/Strength: Functionally intact. No obvious neuro-muscular anomalies detected. Sensory (Neurological): Unimpaired Palpation: No palpable anomalies              Upper Extremity (UE) Exam    Side: Right upper extremity  Side: Left upper extremity  Skin & Extremity Inspection: Skin color, temperature, and hair growth are WNL. No peripheral edema or cyanosis. No masses, redness, swelling, asymmetry, or associated skin lesions. No contractures.  Skin & Extremity Inspection: Skin color, temperature, and hair growth are WNL. No peripheral edema or cyanosis. No masses, redness, swelling, asymmetry, or associated skin lesions. No contractures.  Functional ROM: Unrestricted ROM          Functional ROM: Unrestricted ROM          Muscle Tone/Strength: Functionally intact. No obvious neuro-muscular anomalies detected.  Muscle Tone/Strength: Functionally intact. No obvious neuro-muscular anomalies detected.  Sensory (Neurological): Unimpaired          Sensory (Neurological): Unimpaired          Palpation: No palpable anomalies              Palpation: No palpable anomalies              Specialized Test(s): Deferred         Specialized  Test(s): Deferred          Thoracic Spine Area Exam  Skin & Axial Inspection: No masses, redness, or swelling Alignment: Symmetrical Functional ROM: Unrestricted ROM Stability: No instability detected Muscle Tone/Strength: Functionally intact. No obvious neuro-muscular anomalies detected. Sensory (Neurological): Unimpaired Muscle strength & Tone: No palpable anomalies  Lumbar Spine Area Exam  Skin & Axial Inspection: No masses, redness, or swelling Alignment: Symmetrical Functional ROM: Unrestricted ROM      Stability: No instability detected Muscle Tone/Strength: Functionally intact. No obvious neuro-muscular anomalies detected. Sensory (Neurological): Unimpaired Palpation: No palpable anomalies       Provocative Tests: Lumbar Hyperextension and rotation test: evaluation deferred today       Lumbar Lateral bending test: evaluation deferred today       Patrick's Maneuver: evaluation deferred today                    Gait & Posture Assessment  Ambulation: Unassisted Gait: Relatively normal for age and body habitus Posture: WNL   Lower Extremity Exam    Side: Right lower extremity  Side: Left lower extremity  Skin & Extremity Inspection: Skin color, temperature, and hair growth are WNL. No peripheral edema or cyanosis. No masses, redness, swelling, asymmetry, or associated skin lesions. No contractures.  Skin & Extremity Inspection: Skin color, temperature, and hair growth are WNL. No peripheral edema or cyanosis. No masses, redness, swelling, asymmetry, or associated skin lesions. No contractures.  Functional  ROM: Unrestricted ROM          Functional ROM: Unrestricted ROM          Muscle Tone/Strength: Functionally intact. No obvious neuro-muscular anomalies detected.  Muscle Tone/Strength: Functionally intact. No obvious neuro-muscular anomalies detected.  Sensory (Neurological): Unimpaired  Sensory (Neurological): Unimpaired  Palpation: No palpable anomalies  Palpation: No  palpable anomalies   Assessment  Primary Diagnosis & Pertinent Problem List: The primary encounter diagnosis was Chronic low back pain (Bilateral) (R>L). Diagnoses of Lumbar facet syndrome (Bilateral) (R>L), Chronic pain of both hips, Chronic pain syndrome, Fibromyalgia, Chronic pain of both lower extremities, Muscle cramping, Neurogenic pain, Musculoskeletal pain, and Long term current use of opiate analgesic were also pertinent to this visit.  Status Diagnosis  Controlled Controlled Controlled 1. Chronic low back pain (Bilateral) (R>L)   2. Lumbar facet syndrome (Bilateral) (R>L)   3. Chronic pain of both hips   4. Chronic pain syndrome   5. Fibromyalgia   6. Chronic pain of both lower extremities   7. Muscle cramping   8. Neurogenic pain   9. Musculoskeletal pain   10. Long term current use of opiate analgesic     Problems updated and reviewed during this visit: No problems updated. Plan of Care  Pharmacotherapy (Medications Ordered): Meds ordered this encounter  Medications  . oxyCODONE (OXY IR/ROXICODONE) 5 MG immediate release tablet    Sig: Take 1 tablet (5 mg total) by mouth every 6 (six) hours as needed for severe pain.    Dispense:  120 tablet    Refill:  0    Do not add this medication to the electronic "Automatic Refill" notification system. Patient may have prescription filled one day early if pharmacy is closed on scheduled refill date. Do not fill until: 12/14/2016 To last until: 01/13/2017    Order Specific Question:   Supervising Provider    Answer:   Milinda Pointer [phone]  . pregabalin (LYRICA) 150 MG capsule    Sig: Take 1 capsule (150 mg total) by mouth every 8 (eight) hours.    Dispense:  90 capsule    Refill:  2    Do not place medication on "Automatic Refill". Fill one day early if pharmacy is closed on scheduled refill date.    Order Specific Question:   Supervising Provider    Answer:   Milinda Pointer [phone]  . metaxalone (SKELAXIN) 800 MG  tablet    Sig: Take 1 tablet (800 mg total) by mouth 3 (three) times daily.    Dispense:  270 tablet    Refill:  0    Do not place this medication, or any other prescription from our practice, on "Automatic Refill". Patient may have prescription filled one day early if pharmacy is closed on scheduled refill date.    Order Specific Question:   Supervising Provider    Answer:   Milinda Pointer [phone]  . oxyCODONE (OXY IR/ROXICODONE) 5 MG immediate release tablet    Sig: Take 1 tablet (5 mg total) by mouth every 4 (four) hours as needed for severe pain.    Dispense:  120 tablet    Refill:  0    Patient may have prescription filled one day early if pharmacy is closed on scheduled refill date. Do not fill until: 01/13/2017 To last until: 02/12/2017    Order Specific Question:   Supervising Provider    Answer:   Milinda Pointer [phone]  . oxyCODONE (OXY IR/ROXICODONE) 5 MG immediate release tablet  Sig: Take 1 tablet (5 mg total) by mouth every 6 (six) hours as needed for severe pain.    Dispense:  120 tablet    Refill:  0    Do not add this medication to the electronic "Automatic Refill" notification system. Patient may have prescription filled one day early if pharmacy is closed on scheduled refill date. Do not fill until: 02/12/2017 To last until: 03/14/2017    Order Specific Question:   Supervising Provider    Answer:   Milinda Pointer [phone]   New Prescriptions   No medications on file   Medications administered today: Mary Cruz had no medications administered during this visit. Lab-work, procedure(s), and/or referral(s): Orders Placed This Encounter  Procedures  . ToxASSURE Select 13 (MW), Urine   Imaging and/or referral(s): None  Interventional therapies: Planned, scheduled, and/or pending:  Not at time.   Considering:  Palliative Bilateral lumbar facet radiofrequency.    Palliative PRN treatment(s):  Palliative left-sided C7-T1 cervical epidural steroid  injection    Provider-requested follow-up: Return in about 3 months (around 02/21/2017) for MedMgmt.  Future Appointments Date Time Provider [place]  02/20/2017 11:00 AM Vevelyn Francois, NP [facility] None   Primary Care Physician: System, Provider Not In Location: [facility] Note by: Vevelyn Francois NP Date: 11/21/2016; Time: 4:02 PM  Pain Score Disclaimer: We use the NRS-11 scale. This is a self-reported, subjective measurement of pain severity with only modest accuracy. It is used primarily to identify changes within a particular patient. It must be understood that outpatient pain scales are significantly less accurate that those used for research, where they can be applied under ideal controlled circumstances with minimal exposure to variables. In reality, the score is likely to be a combination of pain intensity and pain affect, where pain affect describes the degree of emotional arousal or changes in action readiness caused by the sensory experience of pain. Factors such as social and work situation, setting, emotional state, anxiety levels, expectation, and prior pain experience may influence pain perception and show large inter-individual differences that may also be affected by time variables.  Patient instructions provided during this appointment: Patient Instructions   BMI Assessment: Estimated body mass index is 41.2 kg/m as calculated from the following:   Height as of this encounter: 5' 4"  (1.626 m).   Weight as of this encounter: 240 lb (108.9 kg).  BMI interpretation table: BMI level Category Range association with higher incidence of chronic pain  <18 kg/m2 Underweight   18.5-24.9 kg/m2 Ideal body weight   25-29.9 kg/m2 Overweight Increased incidence by 20%  30-34.9 kg/m2 Obese (Class I) Increased incidence by 68%  35-39.9 kg/m2 Severe obesity (Class II) Increased incidence by 136%  >40 kg/m2 Extreme obesity (Class III) Increased  incidence by 254%   BMI Readings from Last 4 Encounters:  11/21/16 41.20 kg/m  10/24/16 45.14 kg/m  10/10/16 46.17 kg/m  10/02/16 42.40 kg/m   Wt Readings from Last 4 Encounters:  11/21/16 240 lb (108.9 kg)  10/24/16 263 lb (119.3 kg)  10/10/16 269 lb (122 kg)  10/02/16 247 lb (112 kg)

## 2016-11-21 NOTE — Patient Instructions (Signed)
BMI Assessment: Estimated body mass index is 41.2 kg/m as calculated from the following:   Height as of this encounter: 5\' 4"  (1.626 m).   Weight as of this encounter: 240 lb (108.9 kg).  BMI interpretation table: BMI level Category Range association with higher incidence of chronic pain  <18 kg/m2 Underweight   18.5-24.9 kg/m2 Ideal body weight   25-29.9 kg/m2 Overweight Increased incidence by 20%  30-34.9 kg/m2 Obese (Class I) Increased incidence by 68%  35-39.9 kg/m2 Severe obesity (Class II) Increased incidence by 136%  >40 kg/m2 Extreme obesity (Class III) Increased incidence by 254%   BMI Readings from Last 4 Encounters:  11/21/16 41.20 kg/m  10/24/16 45.14 kg/m  10/10/16 46.17 kg/m  10/02/16 42.40 kg/m   Wt Readings from Last 4 Encounters:  11/21/16 240 lb (108.9 kg)  10/24/16 263 lb (119.3 kg)  10/10/16 269 lb (122 kg)  10/02/16 247 lb (112 kg)

## 2016-11-21 NOTE — Progress Notes (Signed)
Nursing Pain Medication Assessment:  Safety precautions to be maintained throughout the outpatient stay will include: orient to surroundings, keep bed in low position, maintain call bell within reach at all times, provide assistance with transfer out of bed and ambulation.  Medication Inspection Compliance: Pill count conducted under aseptic conditions, in front of the patient. Neither the pills nor the bottle was removed from the patient's sight at any time. Once count was completed pills were immediately returned to the patient in their original bottle.  Medication: Oxycodone IR Pill/Patch Count: 114 of 120 pills remain Pill/Patch Appearance: Markings consistent with prescribed medication Bottle Appearance: Standard pharmacy container. Clearly labeled. Filled Date: 10 / 04 / 2018 Last Medication intake:  Today

## 2016-11-27 LAB — TOXASSURE SELECT 13 (MW), URINE

## 2017-01-08 ENCOUNTER — Telehealth: Payer: Self-pay | Admitting: Emergency Medicine

## 2017-01-08 DIAGNOSIS — F32A Depression, unspecified: Secondary | ICD-10-CM

## 2017-01-08 DIAGNOSIS — F329 Major depressive disorder, single episode, unspecified: Secondary | ICD-10-CM

## 2017-01-08 DIAGNOSIS — F419 Anxiety disorder, unspecified: Principal | ICD-10-CM

## 2017-01-08 NOTE — Telephone Encounter (Signed)
Patient calling to get rx refill for Cymbalta; originally from [place]; has seen Dr.Alexander recently; was told by her pain med Dr. To ask [place] to be in charge of the Cymbalta. She would appreciate [facility] [address]

## 2017-01-08 NOTE — Telephone Encounter (Signed)
Final with me. Okay to send 90 days supply with 3 refills

## 2017-01-10 MED ORDER — DULOXETINE HCL 60 MG PO CPEP: 60.0000 mg | ORAL_CAPSULE | Freq: Two times a day (BID) | ORAL | 3 refills | Status: DC

## 2017-01-10 NOTE — Telephone Encounter (Signed)
Rx sent Pt advised 

## 2017-01-18 ENCOUNTER — Emergency Department (INDEPENDENT_AMBULATORY_CARE_PROVIDER_SITE_OTHER)
Admission: EM | Admit: 2017-01-18 | Discharge: 2017-01-18 | Disposition: A | Discharge status: Home / Self Care | Payer: BLUE CROSS/BLUE SHIELD | Source: Home / Self Care | Attending: Family Medicine | Admitting: Family Medicine

## 2017-01-18 ENCOUNTER — Other Ambulatory Visit: Payer: Self-pay

## 2017-01-18 ENCOUNTER — Encounter: Payer: Self-pay | Admitting: Emergency Medicine

## 2017-01-18 DIAGNOSIS — B9789 Other viral agents as the cause of diseases classified elsewhere: Secondary | ICD-10-CM

## 2017-01-18 DIAGNOSIS — J069 Acute upper respiratory infection, unspecified: Secondary | ICD-10-CM | POA: Diagnosis not present

## 2017-01-18 MED ORDER — AZITHROMYCIN 250 MG PO TABS: ORAL_TABLET | ORAL | 0 refills | Status: DC

## 2017-01-18 MED ORDER — PREDNISONE 20 MG PO TABS
ORAL_TABLET | ORAL | 0 refills | Status: DC
Start: 2017-01-18 — End: 2017-03-06

## 2017-01-18 MED ORDER — BENZONATATE 200 MG PO CAPS: ORAL_CAPSULE | ORAL | 0 refills | Status: DC

## 2017-01-18 NOTE — Discharge Instructions (Signed)
Take plain guaifenesin (1200mg  extended release tabs such as Mucinex) twice daily, with plenty of water, for cough and congestion.  May add Pseudoephedrine (30mg , one or two every 4 to 6 hours) for sinus congestion.  Get adequate rest.   May use Afrin nasal spray (or generic oxymetazoline) each morning for about 5 days and then discontinue.  Also recommend using saline nasal spray several times daily and saline nasal irrigation (AYR is a common brand).  Use Flonase nasal spray each morning after using Afrin nasal spray and saline nasal irrigation. Try warm salt water gargles for sore throat.  Stop all antihistamines for now, and other non-prescription cough/cold preparations. May use albuterol inhaler as needed.

## 2017-01-18 NOTE — ED Triage Notes (Signed)
Reports sore throat and ear pain both sides for 2 days; ibuprofen this morning.

## 2017-01-18 NOTE — ED Provider Notes (Signed)
[facility]    CSN: [id number] Arrival date & time: 01/18/17  1738     History   Chief Complaint Chief Complaint  Patient presents with  . Sore Throat  . Otalgia    HPI Mary Cruz is a 41 y.o. female.   Patient reports that she developed a cold about 3 weeks ago that seemed to resolve.  About 5 days ago she developed a recurrent sore throat, followed by sinus congestion, fatigue, myalgias, and partly productive cough at night.  She has a past history of pneumonia.   The history is provided by the patient.    Past Medical History:  Diagnosis Date  . Abscess of axillary fold 10/10/2013  . Acute bronchitis with asthma with acute exacerbation 01/23/2015  . Fibromyalgia   . Hypertension   . Lupus   . Osteoarthritis   . Pain    chronic regional pain syndrome  . Sepsis affecting skin ([place]) 10/10/2013    Patient Active Problem List   Diagnosis Date Noted  . Family history of thyroid disease 10/24/2016  . Neurogenic pain 10/10/2016  . Fibromyalgia 10/10/2016  . Spinal cord stimulator status 05/16/2016  . Battery end of life of spinal cord stimulator 05/16/2016  . Positive ANA (antinuclear antibody) 09/13/2015  . Lumbar spondylosis 06/29/2015  . Lumbar facet syndrome (Bilateral) (R>L) 06/29/2015  . Implant pain at battery site (right buttocks area) 04/05/2015  . Anxiety and depression 01/20/2015  . Right knee pain 01/10/2015  . Long term current use of opiate analgesic 12/19/2014  . Long term prescription opiate use 12/19/2014  . Opiate use (30 MME/Day) 12/19/2014  . Opiate dependence ([place]) 12/19/2014  . Encounter for therapeutic drug level monitoring 12/19/2014  . Musculoskeletal pain 12/19/2014  . Muscle cramping 12/19/2014  . Osteoarthrosis 12/19/2014  . Vitamin D insufficiency 12/19/2014  . 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 Encounter for management of implanted device 12/19/2014  .  Chronic low back pain (Bilateral) (R>L) 12/19/2014  . Chronic lumbar radicular pain (Right L5 dermatome; Left S1 Dermatome) (Bilateral) (R>L) 12/19/2014  . Chronic hip pain (Location of Tertiary source of pain) (Bilateral) (R>L) 12/19/2014  . Chronic neck pain (Location of Primary Source of Pain) (Bilateral) (L>R) 12/19/2014  . Chronic upper extremity pain (Location of Secondary source of pain) (Bilateral) (L>R) 12/19/2014  . Morbid obesity ([place]) 12/19/2014  . Nicotine dependence 12/19/2014  . Chronic cervical radicular pain (C7 Dermatome) (Bilateral) (L>R) 12/19/2014  . Chronic lower extremity pain (Bilateral) (R>L) 12/19/2014  . Obesity 10/27/2014  . Generalized anxiety disorder 03/16/2014  . Essential hypertension, benign 07/09/2013  . Chronic pain syndrome 07/09/2013  . GERD 07/02/2007  . Irritable bowel syndrome 07/02/2007  . KIDNEY STONES 07/02/2007    Past Surgical History:  Procedure Laterality Date  . BREAST SURGERY    . CHOLECYSTECTOMY  2000  . ENDOMETRIAL ABLATION    . KNEE SURGERY    . KNEE SURGERY Right 02-2015  . OCCIPITAL NERVE STIMULATOR INSERTION    . SHOULDER SURGERY    . spinal cord stimulator    . SPINAL CORD STIMULATOR IMPLANT  2008  . SPINAL CORD STIMULATOR INSERTION N/A 06/21/2016   Procedure: LUMBAR SPINAL CORD STIMULATOR INSERTION;  Surgeon: Clydell Hakim, MD;  Location: [place];  Service: Neurosurgery;  Laterality: N/A;  LUMBAR SPINAL CORD STIMULATOR INSERTION  . TUBAL LIGATION  2000    OB History    No data available  Home Medications    Prior to Admission medications   Medication Sig Start Date End Date Taking? Authorizing Provider  albuterol (PROVENTIL HFA;VENTOLIN HFA) 108 (90 Base) MCG/ACT inhaler Inhale 1-2 puffs into the lungs every 6 (six) hours as needed for wheezing or shortness of breath. 03/04/16   Fransico Meadow, PA-C  amitriptyline (ELAVIL) 100 MG tablet TAKE 1 TABLET (100 MG TOTAL) BY MOUTH AT BEDTIME. 10/14/16   Silverio Decamp, MD  azithromycin (ZITHROMAX Z-PAK) 250 MG tablet Take 2 tabs today; then begin one tab once daily for 4 more days. 01/18/17   Kandra Nicolas, MD  benzonatate (TESSALON) 200 MG capsule Take one cap by mouth at bedtime as needed for cough.  May repeat in 4 to 6 hours 01/18/17   Kandra Nicolas, MD  Cholecalciferol (VITAMIN D3) 2000 units TABS Take 1 tablet by mouth daily.    [provider]  DULoxetine (CYMBALTA) 60 MG capsule Take 1 capsule (60 mg total) by mouth 2 (two) times daily. 01/10/17   Emeterio Reeve, DO  lisinopril-hydrochlorothiazide (PRINZIDE,ZESTORETIC) 10-12.5 MG tablet Take 0.5 tablets by mouth daily. 04/02/16   Silverio Decamp, MD  metaxalone (SKELAXIN) 800 MG tablet Take 1 tablet (800 mg total) by mouth 3 (three) times daily. 12/14/16 03/14/17  Vevelyn Francois, NP  omeprazole (PRILOSEC) 40 MG capsule Take 1 capsule (40 mg total) by mouth daily. 10/24/16   Emeterio Reeve, DO  oxyCODONE (OXY IR/ROXICODONE) 5 MG immediate release tablet Take 1 tablet (5 mg total) by mouth every 6 (six) hours as needed for severe pain. 12/14/16 01/13/17  Vevelyn Francois, NP  oxyCODONE (OXY IR/ROXICODONE) 5 MG immediate release tablet Take 1 tablet (5 mg total) by mouth every 4 (four) hours as needed for severe pain. 01/13/17 02/12/17  Vevelyn Francois, NP  oxyCODONE (OXY IR/ROXICODONE) 5 MG immediate release tablet Take 1 tablet (5 mg total) by mouth every 6 (six) hours as needed for severe pain. 02/12/17 03/14/17  Vevelyn Francois, NP  predniSONE (DELTASONE) 20 MG tablet Take one tab by mouth twice daily for 4 days, then one daily. Take with food. 01/18/17   Kandra Nicolas, MD  pregabalin (LYRICA) 150 MG capsule Take 1 capsule (150 mg total) by mouth every 8 (eight) hours. 12/14/16 03/14/17  Vevelyn Francois, NP    Family History Family History  Problem Relation Age of Onset  . Depression Mother        maternal grandmother  . Asthma Mother   . Fibromyalgia Unknown        aunt  .  Stroke Unknown        grandmother  . Thyroid disease Unknown        grandmother    Social History Social History   Tobacco Use  . Smoking status: Current Every Day Smoker    Types: Cigarettes    Last attempt to quit: 08/01/2015    Years since quitting: 1.4  . Smokeless tobacco: Never Used  Substance Use Topics  . Alcohol use: No    Alcohol/week: 0.0 oz  . Drug use: No     Allergies   Keflex [cephalexin]; Amoxicillin; Celebrex [celecoxib]; Pentazocine lactate; Bactrim [sulfamethoxazole-trimethoprim]; Doxycycline; Erythromycin; Hydromorphone hcl; Latex; and Nabumetone   Review of Systems Review of Systems + sore throat + cough No pleuritic pain No wheezing + nasal congestion + post-nasal drainage No sinus pain/pressure No itchy/red eyes ? earache No hemoptysis No SOB No fever, + chills No nausea No  vomiting No abdominal pain No diarrhea No urinary symptoms No skin rash + fatigue + myalgias + headache Used OTC meds without relief   Physical Exam Triage Vital Signs ED Triage Vitals [01/18/17 1817]  Enc Vitals Group     BP 135/87     Pulse Rate (!) 101     Resp 16     Temp 98.3 F (36.8 C)     Temp Source Oral     SpO2 99 %     Weight 275 lb (124.7 kg)     Height 5\' 4"  (1.626 m)     Head Circumference      Peak Flow      Pain Score      Pain Loc      Pain Edu?      Excl. in [place]?    No data found.  Updated Vital Signs BP 135/87 (BP Location: Right Arm)   Pulse (!) 101   Temp 98.3 F (36.8 C) (Oral)   Resp 16   Ht 5\' 4"  (1.626 m)   Wt 275 lb (124.7 kg)   LMP 12/19/2016 (Approximate)   SpO2 99%   BMI 47.20 kg/m   Visual Acuity Right Eye Distance:   Left Eye Distance:   Bilateral Distance:    Right Eye Near:   Left Eye Near:    Bilateral Near:     Physical Exam Nursing notes and Vital Signs reviewed. Appearance:  Patient appears stated age, and in no acute distress Eyes:  Pupils are equal, round, and reactive to light and  accomodation.  Extraocular movement is intact.  Conjunctivae are not inflamed  Ears:  Canals normal.  Tympanic membranes normal.  Nose:  Mildly congested turbinates.  No sinus tenderness.   Pharynx:  Normal Neck:  Supple.  Enlarged posterior/lateral nodes are palpated bilaterally, tender to palpation on the left.   Lungs:  Clear to auscultation.  Breath sounds are equal.  Moving air well. Heart:  Regular rate and rhythm without murmurs, rubs, or gallops.  Abdomen:  Nontender without masses or hepatosplenomegaly.  Bowel sounds are present.  No CVA or flank tenderness.  Extremities:  No edema.  Skin:  No rash present.    UC Treatments / Results  Labs (all labs ordered are listed, but only abnormal results are displayed) Labs Reviewed  POCT RAPID STREP A (OFFICE) negative    EKG  EKG Interpretation None       Radiology No results found.  Procedures Procedures (including critical care time)  Medications Ordered in UC Medications - No data to display   Initial Impression / Assessment and Plan / UC Course  I have reviewed the triage vital signs and the nursing notes.  Pertinent labs & imaging results that were available during my care of the patient were reviewed by me and considered in my medical decision making (see chart for details).    Begin empiric Z-pak and prednisone burst/taper. Prescription written for Benzonatate [facility]) to take at bedtime for night-time cough.  Take plain guaifenesin (1200mg  extended release tabs such as Mucinex) twice daily, with plenty of water, for cough and congestion.  May add Pseudoephedrine (30mg , one or two every 4 to 6 hours) for sinus congestion.  Get adequate rest.   May use Afrin nasal spray (or generic oxymetazoline) each morning for about 5 days and then discontinue.  Also recommend using saline nasal spray several times daily and saline nasal irrigation (AYR is a common brand).  Use Flonase nasal  spray each morning after using Afrin  nasal spray and saline nasal irrigation. Try warm salt water gargles for sore throat.  Stop all antihistamines for now, and other non-prescription cough/cold preparations. May use albuterol inhaler as needed.    Final Clinical Impressions(s) / UC Diagnoses   Final diagnoses:  Viral URI with cough    ED Discharge Orders        Ordered    azithromycin (ZITHROMAX Z-PAK) 250 MG tablet     01/18/17 1933    predniSONE (DELTASONE) 20 MG tablet     01/18/17 1933    benzonatate (TESSALON) 200 MG capsule     01/18/17 1933           Kandra Nicolas, MD 01/20/17 2251

## 2017-01-22 LAB — POCT RAPID STREP A (OFFICE): Rapid Strep A Screen: NEGATIVE

## 2017-01-31 ENCOUNTER — Other Ambulatory Visit: Payer: Self-pay

## 2017-01-31 MED ORDER — FLUCONAZOLE 150 MG PO TABS: 150.0000 mg | ORAL_TABLET | Freq: Once | ORAL | 0 refills | Status: AC

## 2017-01-31 NOTE — Telephone Encounter (Signed)
Patient called stating she went to UC on 01/16/17 for an URI. Currently taking z-pack. She now thinks she has an yeast infection. Requesting abx to be sent to her local pharmacy. Please advise, thanks.

## 2017-01-31 NOTE — Telephone Encounter (Signed)
Pended prescription for Diflucan. There are several pharmacies on file, [facility]. Please confirm pharmacy and then can go ahead and send the [place].

## 2017-01-31 NOTE — Telephone Encounter (Signed)
[facility] [place] [place].

## 2017-01-31 NOTE — Telephone Encounter (Signed)
Sent, thank you

## 2017-02-20 ENCOUNTER — Ambulatory Visit: Payer: BLUE CROSS/BLUE SHIELD | Attending: Nurse Practitioner | Admitting: Nurse Practitioner

## 2017-02-27 ENCOUNTER — Ambulatory Visit: Payer: BLUE CROSS/BLUE SHIELD | Admitting: Nurse Practitioner

## 2017-02-27 ENCOUNTER — Other Ambulatory Visit: Payer: Self-pay | Admitting: Sports Medicine

## 2017-03-06 ENCOUNTER — Emergency Department (HOSPITAL_COMMUNITY): Payer: BLUE CROSS/BLUE SHIELD

## 2017-03-06 ENCOUNTER — Encounter: Payer: Self-pay | Admitting: Nurse Practitioner

## 2017-03-06 ENCOUNTER — Ambulatory Visit: Payer: BLUE CROSS/BLUE SHIELD | Attending: Nurse Practitioner | Admitting: Nurse Practitioner

## 2017-03-06 ENCOUNTER — Other Ambulatory Visit: Payer: Self-pay

## 2017-03-06 ENCOUNTER — Encounter (HOSPITAL_COMMUNITY): Payer: Self-pay | Admitting: Emergency Medicine

## 2017-03-06 ENCOUNTER — Emergency Department (HOSPITAL_COMMUNITY)
Admission: EM | Admit: 2017-03-06 | Discharge: 2017-03-06 | Disposition: A | Discharge status: Home / Self Care | Payer: BLUE CROSS/BLUE SHIELD | Attending: Emergency Medicine | Admitting: Emergency Medicine

## 2017-03-06 VITALS — BP 116/85 | HR 100 | Temp 98.1°F | Resp 16 | Ht 64.0 in | Wt 275.0 lb

## 2017-03-06 DIAGNOSIS — F329 Major depressive disorder, single episode, unspecified: Secondary | ICD-10-CM | POA: Insufficient documentation

## 2017-03-06 DIAGNOSIS — M79605 Pain in left leg: Secondary | ICD-10-CM | POA: Insufficient documentation

## 2017-03-06 DIAGNOSIS — Z87891 Personal history of nicotine dependence: Secondary | ICD-10-CM | POA: Diagnosis not present

## 2017-03-06 DIAGNOSIS — I10 Essential (primary) hypertension: Secondary | ICD-10-CM | POA: Diagnosis not present

## 2017-03-06 DIAGNOSIS — M545 Low back pain, unspecified: Secondary | ICD-10-CM

## 2017-03-06 DIAGNOSIS — M47896 Other spondylosis, lumbar region: Secondary | ICD-10-CM | POA: Insufficient documentation

## 2017-03-06 DIAGNOSIS — M792 Neuralgia and neuritis, unspecified: Secondary | ICD-10-CM | POA: Insufficient documentation

## 2017-03-06 DIAGNOSIS — Z881 Allergy status to other antibiotic agents status: Secondary | ICD-10-CM | POA: Insufficient documentation

## 2017-03-06 DIAGNOSIS — M7918 Myalgia, other site: Secondary | ICD-10-CM | POA: Diagnosis not present

## 2017-03-06 DIAGNOSIS — Z79899 Other long term (current) drug therapy: Secondary | ICD-10-CM | POA: Insufficient documentation

## 2017-03-06 DIAGNOSIS — F1120 Opioid dependence, uncomplicated: Secondary | ICD-10-CM | POA: Diagnosis not present

## 2017-03-06 DIAGNOSIS — M79601 Pain in right arm: Secondary | ICD-10-CM | POA: Insufficient documentation

## 2017-03-06 DIAGNOSIS — W010XXA Fall on same level from slipping, tripping and stumbling without subsequent striking against object, initial encounter: Secondary | ICD-10-CM | POA: Diagnosis not present

## 2017-03-06 DIAGNOSIS — M25561 Pain in right knee: Secondary | ICD-10-CM

## 2017-03-06 DIAGNOSIS — Z880 Allergy status to penicillin: Secondary | ICD-10-CM | POA: Insufficient documentation

## 2017-03-06 DIAGNOSIS — F419 Anxiety disorder, unspecified: Secondary | ICD-10-CM | POA: Insufficient documentation

## 2017-03-06 DIAGNOSIS — M797 Fibromyalgia: Secondary | ICD-10-CM | POA: Insufficient documentation

## 2017-03-06 DIAGNOSIS — E559 Vitamin D deficiency, unspecified: Secondary | ICD-10-CM | POA: Insufficient documentation

## 2017-03-06 DIAGNOSIS — M25551 Pain in right hip: Secondary | ICD-10-CM | POA: Diagnosis not present

## 2017-03-06 DIAGNOSIS — K589 Irritable bowel syndrome without diarrhea: Secondary | ICD-10-CM | POA: Insufficient documentation

## 2017-03-06 DIAGNOSIS — Y999 Unspecified external cause status: Secondary | ICD-10-CM | POA: Insufficient documentation

## 2017-03-06 DIAGNOSIS — M79604 Pain in right leg: Secondary | ICD-10-CM | POA: Insufficient documentation

## 2017-03-06 DIAGNOSIS — K219 Gastro-esophageal reflux disease without esophagitis: Secondary | ICD-10-CM | POA: Insufficient documentation

## 2017-03-06 DIAGNOSIS — M25552 Pain in left hip: Secondary | ICD-10-CM | POA: Diagnosis present

## 2017-03-06 DIAGNOSIS — Z882 Allergy status to sulfonamides status: Secondary | ICD-10-CM | POA: Insufficient documentation

## 2017-03-06 DIAGNOSIS — F17210 Nicotine dependence, cigarettes, uncomplicated: Secondary | ICD-10-CM | POA: Diagnosis not present

## 2017-03-06 DIAGNOSIS — R252 Cramp and spasm: Secondary | ICD-10-CM | POA: Diagnosis not present

## 2017-03-06 DIAGNOSIS — Y929 Unspecified place or not applicable: Secondary | ICD-10-CM | POA: Diagnosis not present

## 2017-03-06 DIAGNOSIS — G894 Chronic pain syndrome: Secondary | ICD-10-CM | POA: Diagnosis not present

## 2017-03-06 DIAGNOSIS — M79602 Pain in left arm: Secondary | ICD-10-CM | POA: Diagnosis not present

## 2017-03-06 DIAGNOSIS — Z91040 Latex allergy status: Secondary | ICD-10-CM | POA: Diagnosis not present

## 2017-03-06 DIAGNOSIS — M1990 Unspecified osteoarthritis, unspecified site: Secondary | ICD-10-CM | POA: Insufficient documentation

## 2017-03-06 DIAGNOSIS — M5416 Radiculopathy, lumbar region: Secondary | ICD-10-CM

## 2017-03-06 DIAGNOSIS — Z886 Allergy status to analgesic agent status: Secondary | ICD-10-CM | POA: Insufficient documentation

## 2017-03-06 DIAGNOSIS — M542 Cervicalgia: Secondary | ICD-10-CM | POA: Diagnosis present

## 2017-03-06 DIAGNOSIS — M5412 Radiculopathy, cervical region: Secondary | ICD-10-CM | POA: Insufficient documentation

## 2017-03-06 DIAGNOSIS — Z79891 Long term (current) use of opiate analgesic: Secondary | ICD-10-CM | POA: Insufficient documentation

## 2017-03-06 DIAGNOSIS — Y939 Activity, unspecified: Secondary | ICD-10-CM | POA: Diagnosis not present

## 2017-03-06 DIAGNOSIS — G8929 Other chronic pain: Secondary | ICD-10-CM

## 2017-03-06 MED ORDER — AMITRIPTYLINE HCL 100 MG PO TABS: ORAL_TABLET | ORAL | 0 refills | Status: DC

## 2017-03-06 MED ORDER — KETOROLAC TROMETHAMINE 30 MG/ML IJ SOLN
30.0000 mg | Freq: Once | INTRAMUSCULAR | Status: AC
  Administered 2017-03-06: 30 mg via INTRAMUSCULAR
  Filled 2017-03-06: qty 1

## 2017-03-06 MED ORDER — OXYCODONE-ACETAMINOPHEN 5-325 MG PO TABS
2.0000 | ORAL_TABLET | Freq: Once | ORAL | Status: AC
  Administered 2017-03-06: 2 via ORAL
  Filled 2017-03-06: qty 2

## 2017-03-06 MED ORDER — IBUPROFEN 600 MG PO TABS: 600.0000 mg | ORAL_TABLET | Freq: Four times a day (QID) | ORAL | 0 refills | Status: DC | PRN

## 2017-03-06 MED ORDER — OXYCODONE HCL 5 MG PO TABS: 5.0000 mg | ORAL_TABLET | Freq: Four times a day (QID) | ORAL | 0 refills | Status: DC | PRN

## 2017-03-06 MED ORDER — OXYCODONE HCL 5 MG PO TABS: 5.0000 mg | ORAL_TABLET | ORAL | 0 refills | Status: DC | PRN

## 2017-03-06 MED ORDER — BACLOFEN 10 MG PO TABS: 10.0000 mg | ORAL_TABLET | Freq: Three times a day (TID) | ORAL | 0 refills | Status: DC

## 2017-03-06 MED ORDER — PREGABALIN 150 MG PO CAPS: 150.0000 mg | ORAL_CAPSULE | Freq: Three times a day (TID) | ORAL | 2 refills | Status: DC

## 2017-03-06 NOTE — Patient Instructions (Addendum)

## 2017-03-06 NOTE — Progress Notes (Addendum)
Patient's Name: Mary Cruz  MRN: [id number]  Referring Provider: No ref. provider found  DOB: 16-Jan-1976  PCP: System, Provider Not In  DOS: 03/06/2017  Note by: Vevelyn Francois NP  Service setting: Ambulatory outpatient  Specialty: Interventional Pain Management  Location: [facility] (AMB) Pain Management Facility    Patient type: Established    Primary Reason(s) for Visit: Encounter for prescription drug management. (Level of risk: moderate)  CC: Back Pain (lower); Hip Pain (bilaterally); Leg Pain (bilaterally); and Neck Pain  HPI  Mary Cruz is a 42 y.o. year old, female patient, who comes today for a medication management evaluation. She has GERD; Irritable bowel syndrome; KIDNEY STONES; Essential hypertension, benign; Chronic pain syndrome; Generalized anxiety disorder; Obesity; Long term current use of opiate analgesic; Long term prescription opiate use; Opiate use (30 MME/Day); Opiate dependence ([place]); Encounter for therapeutic drug level monitoring; Musculoskeletal pain; Muscle cramping; Osteoarthrosis; Vitamin D insufficiency; Presence of functional implant ([employer] Lumbar spinal cord stimulator implant); Encounter for management of implanted device; Chronic low back pain (Bilateral) (R>L); Chronic lumbar radicular pain (Right L5 dermatome; Left S1 Dermatome) (Bilateral) (R>L); Chronic hip pain (Location of Tertiary source of pain) (Bilateral) (R>L); Chronic neck pain (Location of Primary Source of Pain) (Bilateral) (L>R); Chronic upper extremity pain (Location of Secondary source of pain) (Bilateral) (L>R); Morbid obesity ([place]); Nicotine dependence; Chronic cervical radicular pain (C7 Dermatome) (Bilateral) (L>R); Chronic lower extremity pain (Bilateral) (R>L); Right knee pain; Anxiety and depression; Implant pain at battery site (right buttocks area); Lumbar spondylosis; Lumbar facet syndrome (Bilateral) (R>L); Positive ANA (antinuclear antibody); Spinal cord stimulator status; Battery end of  life of spinal cord stimulator; Neurogenic pain; Fibromyalgia; and Family history of thyroid disease on their problem list. Her primarily concern today is the Back Pain (lower); Hip Pain (bilaterally); Leg Pain (bilaterally); and Neck Pain  Pain Assessment: Location: Lower, Right, Left Back Radiating: moves to both hips, down the backs of both legs and stops on the tops of both feet; travels up the middle of back to neck making it difficult to turn head to the left Onset: More than a month ago Duration: Chronic pain Quality: Constant, Aching, Sharp(stiffness takes breath away) Severity: 5 /10 (self-reported pain score)  Note: Reported level is compatible with observation.                          Effect on ADL: unable to do housework without neck stiffening up Timing: Constant Modifying factors: medication, heat, rest  Mary Cruz was last scheduled for an appointment on 02/20/2017 for medication management. During today's appointment we reviewed Mary Cruz chronic pain status, as well as her outpatient medication regimen. She admits that her back pain is getting worse. She is having increased pain with walking. She is having stiffness. She is also having more muscle spasms. She does not feel like methocarbamol is effective. She is concern that the SCS is not effective for the pain despite the use of the settings being adjusted. She denies any accidents or injuries. She is not interested any injections.   The patient  reports that she does not use drugs. Her body mass index is 47.2 kg/m.  Further details on both, my assessment(s), as well as the proposed treatment plan, please see below.  Controlled Substance Pharmacotherapy Assessment REMS (Risk Evaluation and Mitigation Strategy)  Analgesic:Oxycodone IR 5 mg every 6 hours when necessary for pain. (20 mg/day) MME/day:30 mg/day.   Mary Cruz  03/06/2017  9:29 AM  Signed Nursing Pain Medication Assessment:  Safety precautions to be  maintained throughout the outpatient stay will include: orient to surroundings, keep bed in low position, maintain call bell within reach at all times, provide assistance with transfer out of bed and ambulation.  Medication Inspection Compliance: Pill count conducted under aseptic conditions, in front of the patient. Neither the pills nor the bottle was removed from the patient's sight at any time. Once count was completed pills were immediately returned to the patient in their original bottle.  Medication: Oxycodone IR Pill/Patch Count: 44 of 120 pills remain Pill/Patch Appearance: Markings consistent with prescribed medication Bottle Appearance: Standard pharmacy container. Clearly labeled. Filled Date: 01 / 06 / 2019 Last Medication intake:  Today   Pharmacokinetics: Liberation and absorption (onset of action): WNL Distribution (time to peak effect): WNL Metabolism and excretion (duration of action): WNL         Pharmacodynamics: Desired effects: Analgesia: Mary Cruz reports >50% benefit. Functional ability: Patient reports that medication allows her to accomplish basic ADLs Clinically meaningful improvement in function (CMIF): Sustained CMIF goals met Perceived effectiveness: Described as relatively effective, allowing for increase in activities of daily living (ADL) Undesirable effects: Side-effects or Adverse reactions: None reported Monitoring: [place] PMP: Online review of the past 42-monthperiod conducted. Compliant with practice rules and regulations Last UDS on record: Summary  Date Value Ref Range Status  11/21/2016 FINAL  Final    Comment:    ==================================================================== TOXASSURE SELECT 13 (MW) ==================================================================== Test                             Result       Flag       Units Drug Present and Declared for Prescription Verification   Oxycodone                      397           EXPECTED   ng/mg creat   Oxymorphone                    153          EXPECTED   ng/mg creat   Noroxycodone                   628          EXPECTED   ng/mg creat   Noroxymorphone                 83           EXPECTED   ng/mg creat    Sources of oxycodone are scheduled prescription medications.    Oxymorphone, noroxycodone, and noroxymorphone are expected    metabolites of oxycodone. Oxymorphone is also available as a    scheduled prescription medication. ==================================================================== Test                      Result    Flag   Units      Ref Range   Creatinine              212              mg/dL      >=20 ==================================================================== Declared Medications:  The flagging and interpretation on this report are based on the  following declared medications.  Unexpected results may arise from  inaccuracies in the declared  medications.  **Note: The testing scope of this panel includes these medications:  Oxycodone  **Note: The testing scope of this panel does not include following  reported medications:  Albuterol  Amitriptyline (Elavil)  Duloxetine (Cymbalta)  Hydrochlorothiazide (Lisinopril-HCTZ)  Lisinopril (Lisinopril-HCTZ)  Metaxalone  Omeprazole  Pregabalin (Lyrica)  Vitamin D3 ==================================================================== For clinical consultation, please call [phone]. ====================================================================    UDS interpretation: Compliant          Medication Assessment Form: Reviewed. Patient indicates being compliant with therapy Treatment compliance: Compliant Risk Assessment Profile: Aberrant behavior: See prior evaluations. None observed or detected today Comorbid factors increasing risk of overdose: See prior notes. No additional risks detected today Risk of substance use disorder (SUD): Low Opioid Risk Tool - 03/06/17 0938       Family History of Substance Abuse   Alcohol  Negative    Illegal Drugs  Negative    Rx Drugs  Negative      Personal History of Substance Abuse   Alcohol  Negative    Illegal Drugs  Negative    Rx Drugs  Negative      Age   Age between 31-45 years   Yes      History of Preadolescent Sexual Abuse   History of Preadolescent Sexual Abuse  Negative or Female      Psychological Disease   Psychological Disease  Positive anxiety   anxiety   ADD  Negative    OCD  Negative    Bipolar  Negative    Schizophrenia  Negative    Depression  Negative      Total Score   Opioid Risk Tool Scoring  3    Opioid Risk Interpretation  Low Risk      ORT Scoring interpretation table:  Score <3 = Low Risk for SUD  Score between 4-7 = Moderate Risk for SUD  Score >8 = High Risk for Opioid Abuse   Risk Mitigation Strategies:  Patient Counseling: Covered Patient-Prescriber Agreement (PPA): Present and active  Notification to other healthcare providers: Done  Pharmacologic Plan: No change in therapy, at this time.             Laboratory Chemistry  Inflammation Markers (CRP: Acute Phase) (ESR: Chronic Phase) Lab Results  Component Value Date   CRP 1.0 (H) 04/05/2015   ESRSEDRATE 35 (H) 04/05/2015   LATICACIDVEN 1.0 10/10/2013                 Rheumatology Markers Lab Results  Component Value Date   ANA POS (A) 09/11/2015                Renal Function Markers Lab Results  Component Value Date   BUN 16 10/24/2016   CREATININE 1.01 10/24/2016   GFRAA 81 10/24/2016   GFRNONAA 70 10/24/2016                 Hepatic Function Markers Lab Results  Component Value Date   AST 18 10/24/2016   ALT 16 10/24/2016   ALBUMIN 3.8 07/22/2016   ALKPHOS 96 07/22/2016                 Electrolytes Lab Results  Component Value Date   NA 138 10/24/2016   K 4.0 10/24/2016   CL 103 10/24/2016   CALCIUM 9.4 10/24/2016   MG 2.0 04/05/2015                 Neuropathy Markers No results found  for: VITAMINB12, FOLATE,  HGBA1C, HIV               Bone Pathology Markers Lab Results  Component Value Date   VD25OH 25 (L) 10/24/2016                 Coagulation Parameters Lab Results  Component Value Date   INR 1.05 06/21/2016   LABPROT 13.8 06/21/2016   APTT 29 06/21/2016   PLT 285 10/24/2016                 Cardiovascular Markers Lab Results  Component Value Date   HGB 11.8 10/24/2016   HCT 35.6 10/24/2016                 CA Markers No results found for: CEA, CA125, LABCA2               Note: Lab results reviewed.  Recent Diagnostic Imaging Results  DG C-Arm 1-60 Min-No Report Fluoroscopy was utilized by the requesting physician.  No radiographic  interpretation.   Complexity Note: Imaging results reviewed. Results shared with Ms. Denman, using Layman's terms.                         Meds   Current Outpatient Medications:  .  albuterol (PROVENTIL HFA;VENTOLIN HFA) 108 (90 Base) MCG/ACT inhaler, Inhale 1-2 puffs into the lungs every 6 (six) hours as needed for wheezing or shortness of breath., Disp: 1 Inhaler, Rfl: 0 .  amitriptyline (ELAVIL) 100 MG tablet, TAKE 1 TABLET (100 MG TOTAL) BY MOUTH AT BEDTIME., Disp: 90 tablet, Rfl: 0 .  Cholecalciferol (VITAMIN D3) 2000 units TABS, Take 1 tablet by mouth daily., Disp: , Rfl:  .  DULoxetine (CYMBALTA) 60 MG capsule, Take 1 capsule (60 mg total) by mouth 2 (two) times daily., Disp: 180 capsule, Rfl: 3 .  omeprazole (PRILOSEC) 40 MG capsule, Take 1 capsule (40 mg total) by mouth daily., Disp: 90 capsule, Rfl: 3 .  [START ON 05/17/2017] oxyCODONE (OXY IR/ROXICODONE) 5 MG immediate release tablet, Take 1 tablet (5 mg total) by mouth every 6 (six) hours as needed for severe pain., Disp: 120 tablet, Rfl: 0 .  pregabalin (LYRICA) 150 MG capsule, Take 1 capsule (150 mg total) by mouth every 8 (eight) hours., Disp: 90 capsule, Rfl: 2 .  baclofen (LIORESAL) 10 MG tablet, Take 1 tablet (10 mg total) by mouth 3 (three) times daily.,  Disp: 90 tablet, Rfl: 0 .  [START ON 04/17/2017] oxyCODONE (OXY IR/ROXICODONE) 5 MG immediate release tablet, Take 1 tablet (5 mg total) by mouth every 6 (six) hours as needed for severe pain., Disp: 120 tablet, Rfl: 0 .  [START ON 03/18/2017] oxyCODONE (OXY IR/ROXICODONE) 5 MG immediate release tablet, Take 1 tablet (5 mg total) by mouth every 4 (four) hours as needed for severe pain., Disp: 120 tablet, Rfl: 0  ROS  Constitutional: Denies any fever or chills Gastrointestinal: No reported hemesis, hematochezia, vomiting, or acute GI distress Musculoskeletal: Denies any acute onset joint swelling, redness, loss of ROM, or weakness Neurological: No reported episodes of acute onset apraxia, aphasia, dysarthria, agnosia, amnesia, paralysis, loss of coordination, or loss of consciousness  Allergies  Ms. Bridwell is allergic to keflex [cephalexin]; amoxicillin; celebrex [celecoxib]; pentazocine lactate; bactrim [sulfamethoxazole-trimethoprim]; doxycycline; erythromycin; hydromorphone hcl; latex; and nabumetone.  [place]  Drug: Ms. Soza  reports that she does not use drugs. Alcohol:  reports that she does not drink alcohol. Tobacco:  reports that she quit smoking  about 2 months ago. Her smoking use included cigarettes. she has never used smokeless tobacco. Medical:  has a past medical history of Abscess of axillary fold (10/10/2013), Acute bronchitis with asthma with acute exacerbation (01/23/2015), Fibromyalgia, Hypertension, Lupus, Osteoarthritis, Pain, and Sepsis affecting skin ([place]) (10/10/2013). Surgical: Ms. Garnett  has a past surgical history that includes spinal cord stimulator; Occipital nerve stimulator insertion; Cholecystectomy (2000); Knee surgery; Shoulder surgery; Tubal ligation (2000); Endometrial ablation; Spinal cord stimulator implant (2008); Knee surgery (Right, 02-2015); Breast surgery; and Spinal cord stimulator insertion (N/A, 06/21/2016). Family: family history includes Asthma in her  mother; Depression in her mother; Fibromyalgia in her unknown relative; Stroke in her unknown relative; Thyroid disease in her unknown relative.  Constitutional Exam  General appearance: Well nourished, well developed, and well hydrated. In no apparent acute distress Vitals:   03/06/17 0926  BP: 116/85  Pulse: 100  Resp: 16  Temp: 98.1 F (36.7 C)  TempSrc: Oral  SpO2: 99%  Weight: 275 lb (124.7 kg)  Height: 5' 4" (1.626 m)   BMI Assessment: Estimated body mass index is 47.2 kg/m as calculated from the following:   Height as of this encounter: 5' 4" (1.626 m).   Weight as of this encounter: 275 lb (124.7 kg). Psych/Mental status: Alert, oriented x 3 (person, place, & time)       Eyes: PERLA Respiratory: No evidence of acute respiratory distress  Cervical Spine Area Exam  Skin & Axial Inspection: No masses, redness, edema, swelling, or associated skin lesions Alignment: Symmetrical Functional ROM: Unrestricted ROM      Stability: No instability detected Muscle Tone/Strength: Functionally intact. No obvious neuro-muscular anomalies detected. Sensory (Neurological): Unimpaired Palpation: No palpable anomalies              Upper Extremity (UE) Exam    Side: Right upper extremity  Side: Left upper extremity  Skin & Extremity Inspection: Skin color, temperature, and hair growth are WNL. No peripheral edema or cyanosis. No masses, redness, swelling, asymmetry, or associated skin lesions. No contractures.  Skin & Extremity Inspection: Skin color, temperature, and hair growth are WNL. No peripheral edema or cyanosis. No masses, redness, swelling, asymmetry, or associated skin lesions. No contractures.  Functional ROM: Unrestricted ROM          Functional ROM: Unrestricted ROM          Muscle Tone/Strength: Functionally intact. No obvious neuro-muscular anomalies detected.  Muscle Tone/Strength: Functionally intact. No obvious neuro-muscular anomalies detected.  Sensory (Neurological):  Unimpaired          Sensory (Neurological): Unimpaired          Palpation: No palpable anomalies              Palpation: No palpable anomalies              Specialized Test(s): Deferred         Specialized Test(s): Deferred          Thoracic Spine Area Exam  Skin & Axial Inspection: No masses, redness, or swelling Alignment: Symmetrical Functional ROM: Unrestricted ROM Stability: No instability detected Muscle Tone/Strength: Functionally intact. No obvious neuro-muscular anomalies detected. Sensory (Neurological): Unimpaired Muscle strength & Tone: No palpable anomalies  Lumbar Spine Area Exam  Skin & Axial Inspection: No masses, redness, or swelling Alignment: Symmetrical Functional ROM: Unrestricted ROM      Stability: No instability detected Muscle Tone/Strength: Functionally intact. No obvious neuro-muscular anomalies detected. Sensory (Neurological): Unimpaired Palpation: Complains of area being tender to  palpation       Provocative Tests: Lumbar Hyperextension and rotation test: Positive bilaterally for facet joint pain. Lumbar Lateral bending test: evaluation deferred today       Patrick's Maneuver: evaluation deferred today                    Gait & Posture Assessment  Ambulation: Unassisted Gait: Relatively normal for age and body habitus Posture: WNL   Lower Extremity Exam    Side: Right lower extremity  Side: Left lower extremity  Skin & Extremity Inspection: Skin color, temperature, and hair growth are WNL. No peripheral edema or cyanosis. No masses, redness, swelling, asymmetry, or associated skin lesions. No contractures.  Skin & Extremity Inspection: Skin color, temperature, and hair growth are WNL. No peripheral edema or cyanosis. No masses, redness, swelling, asymmetry, or associated skin lesions. No contractures.  Functional ROM: Unrestricted ROM          Functional ROM: Unrestricted ROM          Muscle Tone/Strength: Functionally intact. No obvious neuro-muscular  anomalies detected.  Muscle Tone/Strength: Functionally intact. No obvious neuro-muscular anomalies detected.  Sensory (Neurological): Unimpaired  Sensory (Neurological): Unimpaired  Palpation: No palpable anomalies  Palpation: No palpable anomalies   Assessment  Primary Diagnosis & Pertinent Problem List: The primary encounter diagnosis was Chronic lumbar radicular pain (Right L5 dermatome; Left S1 Dermatome) (Bilateral) (R>L). Diagnoses of Chronic low back pain (Bilateral) (R>L), Chronic pain of both hips, Chronic pain of both lower extremities, Chronic pain syndrome, Neurogenic pain, Fibromyalgia, Muscle cramping, Musculoskeletal pain, and Long term current use of opiate analgesic were also pertinent to this visit.  Status Diagnosis  Worsening Worsening Worsening 1. Chronic lumbar radicular pain (Right L5 dermatome; Left S1 Dermatome) (Bilateral) (R>L)   2. Chronic low back pain (Bilateral) (R>L)   3. Chronic pain of both hips   4. Chronic pain of both lower extremities   5. Chronic pain syndrome   6. Neurogenic pain   7. Fibromyalgia   8. Muscle cramping   9. Musculoskeletal pain   10. Long term current use of opiate analgesic     Problems updated and reviewed during this visit: No problems updated. Plan of Care  Pharmacotherapy (Medications Ordered): Meds ordered this encounter  Medications  . oxyCODONE (OXY IR/ROXICODONE) 5 MG immediate release tablet    Sig: Take 1 tablet (5 mg total) by mouth every 6 (six) hours as needed for severe pain.    Dispense:  120 tablet    Refill:  0    Do not add this medication to the electronic "Automatic Refill" notification system. Patient may have prescription filled one day early if pharmacy is closed on scheduled refill date. Do not fill until: 05/17/2017 To last until: 06/16/2017    Order Specific Question:   Supervising Provider    Answer:   Milinda Pointer [phone]  . oxyCODONE (OXY IR/ROXICODONE) 5 MG immediate release tablet    Sig:  Take 1 tablet (5 mg total) by mouth every 6 (six) hours as needed for severe pain.    Dispense:  120 tablet    Refill:  0    Do not add this medication to the electronic "Automatic Refill" notification system. Patient may have prescription filled one day early if pharmacy is closed on scheduled refill date. Do not fill until: 04/17/2017 To last until: 05/17/2017    Order Specific Question:   Supervising Provider    Answer:   Milinda Pointer [phone]  .  oxyCODONE (OXY IR/ROXICODONE) 5 MG immediate release tablet    Sig: Take 1 tablet (5 mg total) by mouth every 4 (four) hours as needed for severe pain.    Dispense:  120 tablet    Refill:  0    Patient may have prescription filled one day early if pharmacy is closed on scheduled refill date. Do not fill until: 03/18/2017 To last until: 04/17/2017    Order Specific Question:   Supervising Provider    Answer:   Milinda Pointer [phone]  . pregabalin (LYRICA) 150 MG capsule    Sig: Take 1 capsule (150 mg total) by mouth every 8 (eight) hours.    Dispense:  90 capsule    Refill:  2    Do not place medication on "Automatic Refill". Fill one day early if pharmacy is closed on scheduled refill date.    Order Specific Question:   Supervising Provider    Answer:   Milinda Pointer [phone]  . baclofen (LIORESAL) 10 MG tablet    Sig: Take 1 tablet (10 mg total) by mouth 3 (three) times daily.    Dispense:  90 tablet    Refill:  0    Do not place this medication, or any other prescription from our practice, on "Automatic Refill". Patient may have prescription filled one day early if pharmacy is closed on scheduled refill date.    Order Specific Question:   Supervising Provider    Answer:   Milinda Pointer [phone]  . amitriptyline (ELAVIL) 100 MG tablet    Sig: TAKE 1 TABLET (100 MG TOTAL) BY MOUTH AT BEDTIME.    Dispense:  90 tablet    Refill:  0    Order Specific Question:   Supervising Provider    Answer:   Milinda Pointer [phone]    New Prescriptions   BACLOFEN (LIORESAL) 10 MG TABLET    Take 1 tablet (10 mg total) by mouth 3 (three) times daily.   Medications administered today: Emelia Loron had no medications administered during this visit. Lab-work, procedure(s), and/or referral(s): Orders Placed This Encounter  Procedures  . MR LUMBAR SPINE WO CONTRAST  . ToxASSURE Select 13 (MW), Urine   Imaging and/or referral(s): MR LUMBAR SPINE WO CONTRAST  Interventional therapies: Planned, scheduled, and/or pending:  Not at time.   Considering:  Palliative Bilateral lumbar facet radiofrequency.    Palliative PRN treatment(s):  Palliative left-sided C7-T1 cervical epidural steroid injection        Provider-requested follow-up: Return in about 3 months (around 06/04/2017) for MedMgmt with Me Dionisio David), MRI, in addition, F/U eval follow MRI poss change need in treatment.  Future Appointments  Date Time Provider [place]  05/29/2017  9:45 AM Vevelyn Francois, NP [facility] None   Primary Care Physician: System, Provider Not In Location: [facility] Outpatient Pain Management Facility Note by: Vevelyn Francois NP Date: 03/06/2017; Time: 2:17 PM  Pain Score Disclaimer: We use the NRS-11 scale. This is a self-reported, subjective measurement of pain severity with only modest accuracy. It is used primarily to identify changes within a particular patient. It must be understood that outpatient pain scales are significantly less accurate that those used for research, where they can be applied under ideal controlled circumstances with minimal exposure to variables. In reality, the score is likely to be a combination of pain intensity and pain affect, where pain affect describes the degree of emotional arousal or changes in action readiness caused by the sensory experience of pain. Factors  such as social and work situation, setting, emotional state, anxiety levels, expectation, and prior pain experience may  influence pain perception and show large inter-individual differences that may also be affected by time variables.  Patient instructions provided during this appointment: Patient Instructions   ____________________________________________________________________________________________   Medication Rules  Applies to: All patients receiving prescriptions (written or electronic).  Pharmacy of record: Pharmacy where electronic prescriptions will be sent. If written prescriptions are taken to a different pharmacy, please inform the nursing staff. The pharmacy listed in the electronic medical record should be the one where you would like electronic prescriptions to be sent.  Prescription refills: Only during scheduled appointments. Applies to both, written and electronic prescriptions.  NOTE: The following applies primarily to controlled substances (Opioid* Pain Medications).   Patient's responsibilities: 1. Pain Pills: Bring all pain pills to every appointment (except for procedure appointments). 2. Pill Bottles: Bring pills in original pharmacy bottle. Always bring newest bottle. Bring bottle, even if empty. 3. Medication refills: You are responsible for knowing and keeping track of what medications you need refilled. The day before your appointment, write a list of all prescriptions that need to be refilled. Bring that list to your appointment and give it to the admitting nurse. Prescriptions will be written only during appointments. If you forget a medication, it will not be "Called in", "Faxed", or "electronically sent". You will need to get another appointment to get these prescribed. 4. Prescription Accuracy: You are responsible for carefully inspecting your prescriptions before leaving our office. Have the discharge nurse carefully go over each prescription with you, before taking them home. Make sure that your name is accurately spelled, that your address is correct. Check the name and dose  of your medication to make sure it is accurate. Check the number of pills, and the written instructions to make sure they are clear and accurate. Make sure that you are given enough medication to last until your next medication refill appointment. 5. Taking Medication: Take medication as prescribed. Never take more pills than instructed. Never take medication more frequently than prescribed. Taking less pills or less frequently is permitted and encouraged, when it comes to controlled substances (written prescriptions).  6. Inform other Doctors: Always inform, all of your healthcare providers, of all the medications you take. 7. Pain Medication from other Providers: You are not allowed to accept any additional pain medication from any other Doctor or Healthcare provider. There are two exceptions to this rule. (see below) In the event that you require additional pain medication, you are responsible for notifying us, as stated below. 8. Medication Agreement: You are responsible for carefully reading and following our Medication Agreement. This must be signed before receiving any prescriptions from our practice. Safely store a copy of your signed Agreement. Violations to the Agreement will result in no further prescriptions. (Additional copies of our Medication Agreement are available upon request.) 9. Laws, Rules, & Regulations: All patients are expected to follow all Federal and [employer], [employer], Rules, [employer]. Ignorance of the Laws does not constitute a valid excuse. The use of any illegal substances is prohibited. 10. Adopted CDC guidelines & recommendations: Target dosing levels will be at or below 60 MME/day. Use of benzodiazepines** is not recommended.  Exceptions: There are only two exceptions to the rule of not receiving pain medications from other Healthcare Providers. 1. Exception #1 (Emergencies): In the event of an emergency (i.e.: accident requiring emergency care), you are allowed to  receive additional pain medication.  However, you are responsible for: As soon as you are able, call our office [phone], at any time of the day or night, and leave a message stating your name, the date and nature of the emergency, and the name and dose of the medication prescribed. In the event that your call is answered by a member of our staff, make sure to document and save the date, time, and the name of the person that took your information.  2. Exception #2 (Planned Surgery): In the event that you are scheduled by another doctor or dentist to have any type of surgery or procedure, you are allowed (for a period no longer than 30 days), to receive additional pain medication, for the acute post-op pain. However, in this case, you are responsible for picking up a copy of our "Post-op Pain Management for Surgeons" handout, and giving it to your surgeon or dentist. This document is available at our office, and does not require an appointment to obtain it. Simply go to our office during business hours (Monday-Thursday from 8:00 AM to 4:00 PM) (Friday 8:00 AM to 12:00 Noon) or if you have a scheduled appointment with [place], prior to your surgery, and ask for it by name. In addition, you will need to provide [place] with your name, name of your surgeon, type of surgery, and date of procedure or surgery.  *Opioid medications include: morphine, codeine, oxycodone, oxymorphone, hydrocodone, hydromorphone, meperidine, tramadol, tapentadol, buprenorphine, fentanyl, methadone. **Benzodiazepine medications include: diazepam (Valium), alprazolam (Xanax), clonazepam (Klonopine), lorazepam (Ativan), clorazepate (Tranxene), chlordiazepoxide (Librium), estazolam (Prosom), oxazepam (Serax), temazepam (Restoril), triazolam (Halcion)  ____________________________________________________________________________________________

## 2017-03-06 NOTE — Discharge Instructions (Signed)
As discussed, I am suspicious for a possible ligament injury and you will need to see Dr. Rhona Raider for further evaluation.  Use ice as much as possible to help reduce swelling and avoid weightbearing and significant movements of the knee joint, using your knee immobilizer as much as possible.  Take your regular pain medication.  Also take ibuprofen as prescribed.

## 2017-03-06 NOTE — Progress Notes (Signed)
Nursing Pain Medication Assessment:  Safety precautions to be maintained throughout the outpatient stay will include: orient to surroundings, keep bed in low position, maintain call bell within reach at all times, provide assistance with transfer out of bed and ambulation.  Medication Inspection Compliance: Pill count conducted under aseptic conditions, in front of the patient. Neither the pills nor the bottle was removed from the patient's sight at any time. Once count was completed pills were immediately returned to the patient in their original bottle.  Medication: Oxycodone IR Pill/Patch Count: 44 of 120 pills remain Pill/Patch Appearance: Markings consistent with prescribed medication Bottle Appearance: Standard pharmacy container. Clearly labeled. Filled Date: 01 / 06 / 2019 Last Medication intake:  Today

## 2017-03-06 NOTE — ED Triage Notes (Addendum)
PT states she slipped and fell onto her right knee this am around 0730. PT states hx of knee pain on same side. PT states she went to her Chronic back pain MD around 0915 and told them about the fall but they didn't order an xray.

## 2017-03-07 NOTE — ED Provider Notes (Signed)
[facility] EMERGENCY DEPARTMENT Provider Note   CSN: [id number] Arrival date & time: 03/06/17  1422     History   Chief Complaint Chief Complaint  Patient presents with  . Knee Injury    HPI Mary Cruz is a 42 y.o. female presenting with pain and swelling of her right knee from an injury sustained when she slipped and fell directly on the flexed knee, then fell with the knee buckled under her when she hit the ground..  This injury occurred at 730 this morning.  She has been unable to weight-bear since this injury and has applied ice to the site but has persistent pain swelling and now bruising at the site.  She has a chronic low back pain patient, in fact was seen by her chronic pain specialist today.  She denies weakness or numbness distal to the knee, sustained in this fall.  The history is provided by the patient.    Past Medical History:  Diagnosis Date  . Abscess of axillary fold 10/10/2013  . Acute bronchitis with asthma with acute exacerbation 01/23/2015  . Fibromyalgia   . Hypertension   . Lupus   . Osteoarthritis   . Pain    chronic regional pain syndrome  . Sepsis affecting skin ([place]) 10/10/2013    Patient Active Problem List   Diagnosis Date Noted  . Family history of thyroid disease 10/24/2016  . Neurogenic pain 10/10/2016  . Fibromyalgia 10/10/2016  . Spinal cord stimulator status 05/16/2016  . Battery end of life of spinal cord stimulator 05/16/2016  . Positive ANA (antinuclear antibody) 09/13/2015  . Lumbar spondylosis 06/29/2015  . Lumbar facet syndrome (Bilateral) (R>L) 06/29/2015  . Implant pain at battery site (right buttocks area) 04/05/2015  . Anxiety and depression 01/20/2015  . Right knee pain 01/10/2015  . Long term current use of opiate analgesic 12/19/2014  . Long term prescription opiate use 12/19/2014  . Opiate use (30 MME/Day) 12/19/2014  . Opiate dependence ([place]) 12/19/2014  . Encounter for therapeutic drug level monitoring  12/19/2014  . Musculoskeletal pain 12/19/2014  . Muscle cramping 12/19/2014  . Osteoarthrosis 12/19/2014  . Vitamin D insufficiency 12/19/2014  . 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 Encounter for management of implanted device 12/19/2014  . Chronic low back pain (Bilateral) (R>L) 12/19/2014  . Chronic lumbar radicular pain (Right L5 dermatome; Left S1 Dermatome) (Bilateral) (R>L) 12/19/2014  . Chronic hip pain (Location of Tertiary source of pain) (Bilateral) (R>L) 12/19/2014  . Chronic neck pain (Location of Primary Source of Pain) (Bilateral) (L>R) 12/19/2014  . Chronic upper extremity pain (Location of Secondary source of pain) (Bilateral) (L>R) 12/19/2014  . Morbid obesity ([place]) 12/19/2014  . Nicotine dependence 12/19/2014  . Chronic cervical radicular pain (C7 Dermatome) (Bilateral) (L>R) 12/19/2014  . Chronic lower extremity pain (Bilateral) (R>L) 12/19/2014  . Obesity 10/27/2014  . Generalized anxiety disorder 03/16/2014  . Essential hypertension, benign 07/09/2013  . Chronic pain syndrome 07/09/2013  . GERD 07/02/2007  . Irritable bowel syndrome 07/02/2007  . KIDNEY STONES 07/02/2007    Past Surgical History:  Procedure Laterality Date  . BREAST SURGERY    . CHOLECYSTECTOMY  2000  . ENDOMETRIAL ABLATION    . KNEE SURGERY    . KNEE SURGERY Right 02-2015  . OCCIPITAL NERVE STIMULATOR INSERTION    . SHOULDER SURGERY    . spinal cord stimulator    . SPINAL CORD STIMULATOR IMPLANT  2008  . SPINAL CORD  STIMULATOR INSERTION N/A 06/21/2016   Procedure: LUMBAR SPINAL CORD STIMULATOR INSERTION;  Surgeon: Clydell Hakim, MD;  Location: [place];  Service: Neurosurgery;  Laterality: N/A;  LUMBAR SPINAL CORD STIMULATOR INSERTION  . TUBAL LIGATION  2000    OB History    No data available       Home Medications    Prior to Admission medications   Medication Sig Start Date End Date Taking? Authorizing Provider  albuterol  (PROVENTIL HFA;VENTOLIN HFA) 108 (90 Base) MCG/ACT inhaler Inhale 1-2 puffs into the lungs every 6 (six) hours as needed for wheezing or shortness of breath. 03/04/16   Fransico Meadow, PA-C  amitriptyline (ELAVIL) 100 MG tablet TAKE 1 TABLET (100 MG TOTAL) BY MOUTH AT BEDTIME. 03/06/17   Vevelyn Francois, NP  baclofen (LIORESAL) 10 MG tablet Take 1 tablet (10 mg total) by mouth 3 (three) times daily. 03/06/17   Vevelyn Francois, NP  Cholecalciferol (VITAMIN D3) 2000 units TABS Take 1 tablet by mouth daily.    [provider]  DULoxetine (CYMBALTA) 60 MG capsule Take 1 capsule (60 mg total) by mouth 2 (two) times daily. 01/10/17   Emeterio Reeve, DO  ibuprofen (ADVIL,MOTRIN) 600 MG tablet Take 1 tablet (600 mg total) by mouth every 6 (six) hours as needed. 03/06/17   Evalee Jefferson, PA-C  omeprazole (PRILOSEC) 40 MG capsule Take 1 capsule (40 mg total) by mouth daily. 10/24/16   Emeterio Reeve, DO  oxyCODONE (OXY IR/ROXICODONE) 5 MG immediate release tablet Take 1 tablet (5 mg total) by mouth every 6 (six) hours as needed for severe pain. 05/17/17 06/16/17  Vevelyn Francois, NP  oxyCODONE (OXY IR/ROXICODONE) 5 MG immediate release tablet Take 1 tablet (5 mg total) by mouth every 6 (six) hours as needed for severe pain. 04/17/17 05/17/17  Vevelyn Francois, NP  oxyCODONE (OXY IR/ROXICODONE) 5 MG immediate release tablet Take 1 tablet (5 mg total) by mouth every 4 (four) hours as needed for severe pain. 03/18/17 04/17/17  Vevelyn Francois, NP  pregabalin (LYRICA) 150 MG capsule Take 1 capsule (150 mg total) by mouth every 8 (eight) hours. 03/06/17 06/04/17  Vevelyn Francois, NP    Family History Family History  Problem Relation Age of Onset  . Depression Mother        maternal grandmother  . Asthma Mother   . Fibromyalgia Unknown        aunt  . Stroke Unknown        grandmother  . Thyroid disease Unknown        grandmother    Social History Social History   Tobacco Use  . Smoking status: Former  Smoker    Types: Cigarettes    Last attempt to quit: 01/04/2017    Years since quitting: 0.1  . Smokeless tobacco: Never Used  Substance Use Topics  . Alcohol use: No    Alcohol/week: 0.0 oz  . Drug use: No     Allergies   Keflex [cephalexin]; Amoxicillin; Celebrex [celecoxib]; Pentazocine lactate; Bactrim [sulfamethoxazole-trimethoprim]; Doxycycline; Erythromycin; Hydromorphone hcl; Latex; and Nabumetone   Review of Systems Review of Systems  Constitutional: Negative for fever.  Musculoskeletal: Positive for arthralgias and joint swelling. Negative for myalgias.  Neurological: Negative for weakness and numbness.     Physical Exam Updated Vital Signs BP 128/63 (BP Location: Right Arm)   Pulse 98   Temp (!) 97.5 F (36.4 C) (Oral)   Resp 20   Ht 5\' 4"  (1.626 m)  Wt 124.7 kg (275 lb)   SpO2 96%   BMI 47.20 kg/m   Physical Exam  Constitutional: She appears well-developed and well-nourished.  HENT:  Head: Atraumatic.  Neck: Normal range of motion.  Cardiovascular:  Pulses equal bilaterally  Musculoskeletal: She exhibits tenderness.       Right knee: She exhibits swelling, ecchymosis and MCL laxity. She exhibits no deformity, no laceration, normal alignment, no LCL laxity and normal meniscus. Tenderness found.  Patient is tender in the popliteal fossa.  She also has tenderness across the anterior knee with moderate edema and bruising with very superficial abrasion noted.  She has severe pain with valgus strain at the medial and lateral joint spaces, but is not particularly tender to palpation at these 2 locations.  She did appear to have MCL laxity with valgus strain.  Neurological: She is alert. She has normal strength. She displays normal reflexes. No sensory deficit.  Skin: Skin is warm and dry.  Psychiatric: She has a normal mood and affect.     ED Treatments / Results  Labs (all labs ordered are listed, but only abnormal results are displayed) Labs Reviewed -  No data to display  EKG  EKG Interpretation None       Radiology Dg Knee Complete 4 Views Right  Result Date: 03/06/2017 CLINICAL DATA:  42 year old female with a history of fall on right knee and pain EXAM: RIGHT KNEE - COMPLETE 4+ VIEW COMPARISON:  None. FINDINGS: No acute displaced fracture identified. No joint effusion. Mild degenerative changes with joint space narrowing and marginal osteophyte formation. No radiopaque foreign body. IMPRESSION: No acute bony abnormality. Electronically Signed   By: Corrie Mckusick D.O.   On: 03/06/2017 16:12    Procedures Procedures (including critical care time)  Medications Ordered in ED Medications  oxyCODONE-acetaminophen (PERCOCET/ROXICET) 5-325 MG per tablet 2 tablet (2 tablets Oral Given 03/06/17 1804)  ketorolac (TORADOL) 30 MG/ML injection 30 mg (30 mg Intramuscular Given 03/06/17 1804)     Initial Impression / Assessment and Plan / ED Course  I have reviewed the triage vital signs and the nursing notes.  Pertinent labs & imaging results that were available during my care of the patient were reviewed by me and considered in my medical decision making (see chart for details).     Patient with normal plain film imaging after fall involving injury to the right knee.  Her exam is concerning for possible medial collateral ligament injury although the majority of her tenderness is more posterior.  She was placed in a knee immobilizer, crutches given.  Discussed rest, ice, elevation.  She was prescribed ibuprofen and advised to continue taking her regular pain medications.  She is seeing Dr. Rhona Raider in the past for prior orthopedic issues, advised call tomorrow for office visit for further evaluation of this new injury.  Final Clinical Impressions(s) / ED Diagnoses   Final diagnoses:  Acute pain of right knee    ED Discharge Orders        Ordered    ibuprofen (ADVIL,MOTRIN) 600 MG tablet  Every 6 hours PRN     03/06/17 Hannah Beat, PA-C 03/07/17 0226    Long, Wonda Olds, MD 03/07/17 [phone]

## 2017-03-12 LAB — TOXASSURE SELECT 13 (MW), URINE

## 2017-03-19 DIAGNOSIS — M25561 Pain in right knee: Secondary | ICD-10-CM

## 2017-03-19 HISTORY — DX: Pain in right knee: M25.561

## 2017-04-07 ENCOUNTER — Ambulatory Visit
Admission: RE | Admit: 2017-04-07 | Discharge: 2017-04-07 | Disposition: A | Discharge status: Home / Self Care | Payer: BLUE CROSS/BLUE SHIELD | Source: Ambulatory Visit | Attending: Nurse Practitioner | Admitting: Nurse Practitioner

## 2017-04-07 DIAGNOSIS — G8929 Other chronic pain: Secondary | ICD-10-CM

## 2017-04-07 DIAGNOSIS — M545 Low back pain: Secondary | ICD-10-CM

## 2017-04-07 DIAGNOSIS — M5416 Radiculopathy, lumbar region: Principal | ICD-10-CM

## 2017-04-08 NOTE — Progress Notes (Signed)
Results were reviewed and found to be: mildly abnormal  No acute injury or pathology identified  Review would suggest interventional pain management techniques may be of benefit 

## 2017-04-28 ENCOUNTER — Other Ambulatory Visit: Payer: Self-pay

## 2017-04-28 ENCOUNTER — Encounter: Payer: Self-pay | Admitting: Nurse Practitioner

## 2017-04-28 ENCOUNTER — Ambulatory Visit: Payer: BLUE CROSS/BLUE SHIELD | Attending: Nurse Practitioner | Admitting: Nurse Practitioner

## 2017-04-28 VITALS — BP 157/86 | HR 91 | Temp 98.0°F | Resp 18 | Ht 64.0 in | Wt 279.0 lb

## 2017-04-28 DIAGNOSIS — G8929 Other chronic pain: Secondary | ICD-10-CM

## 2017-04-28 DIAGNOSIS — Z886 Allergy status to analgesic agent status: Secondary | ICD-10-CM | POA: Diagnosis not present

## 2017-04-28 DIAGNOSIS — M5412 Radiculopathy, cervical region: Secondary | ICD-10-CM | POA: Diagnosis not present

## 2017-04-28 DIAGNOSIS — M79604 Pain in right leg: Secondary | ICD-10-CM | POA: Insufficient documentation

## 2017-04-28 DIAGNOSIS — F329 Major depressive disorder, single episode, unspecified: Secondary | ICD-10-CM | POA: Insufficient documentation

## 2017-04-28 DIAGNOSIS — G894 Chronic pain syndrome: Secondary | ICD-10-CM | POA: Diagnosis not present

## 2017-04-28 DIAGNOSIS — Z6841 Body Mass Index (BMI) 40.0 and over, adult: Secondary | ICD-10-CM | POA: Diagnosis not present

## 2017-04-28 DIAGNOSIS — M792 Neuralgia and neuritis, unspecified: Secondary | ICD-10-CM | POA: Diagnosis not present

## 2017-04-28 DIAGNOSIS — M25561 Pain in right knee: Secondary | ICD-10-CM | POA: Insufficient documentation

## 2017-04-28 DIAGNOSIS — M79602 Pain in left arm: Secondary | ICD-10-CM | POA: Diagnosis not present

## 2017-04-28 DIAGNOSIS — M25551 Pain in right hip: Secondary | ICD-10-CM

## 2017-04-28 DIAGNOSIS — M545 Low back pain: Secondary | ICD-10-CM | POA: Insufficient documentation

## 2017-04-28 DIAGNOSIS — Z882 Allergy status to sulfonamides status: Secondary | ICD-10-CM | POA: Diagnosis not present

## 2017-04-28 DIAGNOSIS — Z79891 Long term (current) use of opiate analgesic: Secondary | ICD-10-CM

## 2017-04-28 DIAGNOSIS — F419 Anxiety disorder, unspecified: Secondary | ICD-10-CM | POA: Diagnosis not present

## 2017-04-28 DIAGNOSIS — M79605 Pain in left leg: Secondary | ICD-10-CM | POA: Insufficient documentation

## 2017-04-28 DIAGNOSIS — I10 Essential (primary) hypertension: Secondary | ICD-10-CM | POA: Insufficient documentation

## 2017-04-28 DIAGNOSIS — M79601 Pain in right arm: Secondary | ICD-10-CM | POA: Insufficient documentation

## 2017-04-28 DIAGNOSIS — K219 Gastro-esophageal reflux disease without esophagitis: Secondary | ICD-10-CM | POA: Insufficient documentation

## 2017-04-28 DIAGNOSIS — Z881 Allergy status to other antibiotic agents status: Secondary | ICD-10-CM | POA: Insufficient documentation

## 2017-04-28 DIAGNOSIS — M25552 Pain in left hip: Secondary | ICD-10-CM | POA: Diagnosis not present

## 2017-04-28 DIAGNOSIS — Z79899 Other long term (current) drug therapy: Secondary | ICD-10-CM | POA: Insufficient documentation

## 2017-04-28 DIAGNOSIS — K589 Irritable bowel syndrome without diarrhea: Secondary | ICD-10-CM | POA: Insufficient documentation

## 2017-04-28 DIAGNOSIS — Z5181 Encounter for therapeutic drug level monitoring: Secondary | ICD-10-CM | POA: Diagnosis not present

## 2017-04-28 DIAGNOSIS — Z880 Allergy status to penicillin: Secondary | ICD-10-CM | POA: Diagnosis not present

## 2017-04-28 DIAGNOSIS — Z87891 Personal history of nicotine dependence: Secondary | ICD-10-CM | POA: Insufficient documentation

## 2017-04-28 DIAGNOSIS — M47816 Spondylosis without myelopathy or radiculopathy, lumbar region: Secondary | ICD-10-CM | POA: Diagnosis not present

## 2017-04-28 DIAGNOSIS — M797 Fibromyalgia: Secondary | ICD-10-CM

## 2017-04-28 DIAGNOSIS — M7918 Myalgia, other site: Secondary | ICD-10-CM

## 2017-04-28 DIAGNOSIS — E559 Vitamin D deficiency, unspecified: Secondary | ICD-10-CM | POA: Diagnosis not present

## 2017-04-28 DIAGNOSIS — Z9049 Acquired absence of other specified parts of digestive tract: Secondary | ICD-10-CM | POA: Insufficient documentation

## 2017-04-28 MED ORDER — METHOCARBAMOL 500 MG PO TABS: 500.0000 mg | ORAL_TABLET | Freq: Three times a day (TID) | ORAL | 0 refills | Status: DC | PRN

## 2017-04-28 MED ORDER — AMITRIPTYLINE HCL 100 MG PO TABS: ORAL_TABLET | ORAL | 2 refills | Status: DC

## 2017-04-28 MED ORDER — PREGABALIN 150 MG PO CAPS: 150.0000 mg | ORAL_CAPSULE | Freq: Three times a day (TID) | ORAL | 1 refills | Status: DC

## 2017-04-28 MED ORDER — OXYCODONE HCL 5 MG PO TABS: 5.0000 mg | ORAL_TABLET | ORAL | 0 refills | Status: DC | PRN

## 2017-04-28 MED ORDER — OXYCODONE HCL 5 MG PO TABS: 5.0000 mg | ORAL_TABLET | Freq: Four times a day (QID) | ORAL | 0 refills | Status: DC | PRN

## 2017-04-28 NOTE — Patient Instructions (Addendum)
____________________________________________________________________________________________  Medication Rules  Applies to: All patients receiving prescriptions (written or electronic).  Pharmacy of record: Pharmacy where electronic prescriptions will be sent. If written prescriptions are taken to a different pharmacy, please inform the nursing staff. The pharmacy listed in the electronic medical record should be the one where you would like electronic prescriptions to be sent.  Prescription refills: Only during scheduled appointments. Applies to both, written and electronic prescriptions.  NOTE: The following applies primarily to controlled substances (Opioid* Pain Medications).   Patient's responsibilities: 1. Pain Pills: Bring all pain pills to every appointment (except for procedure appointments). 2. Pill Bottles: Bring pills in original pharmacy bottle. Always bring newest bottle. Bring bottle, even if empty. 3. Medication refills: You are responsible for knowing and keeping track of what medications you need refilled. The day before your appointment, write a list of all prescriptions that need to be refilled. Bring that list to your appointment and give it to the admitting nurse. Prescriptions will be written only during appointments. If you forget a medication, it will not be "Called in", "Faxed", or "electronically sent". You will need to get another appointment to get these prescribed. 4. Prescription Accuracy: You are responsible for carefully inspecting your prescriptions before leaving our office. Have the discharge nurse carefully go over each prescription with you, before taking them home. Make sure that your name is accurately spelled, that your address is correct. Check the name and dose of your medication to make sure it is accurate. Check the number of pills, and the written instructions to make sure they are clear and accurate. Make sure that you are given enough medication to last  until your next medication refill appointment. 5. Taking Medication: Take medication as prescribed. Never take more pills than instructed. Never take medication more frequently than prescribed. Taking less pills or less frequently is permitted and encouraged, when it comes to controlled substances (written prescriptions).  6. Inform other Doctors: Always inform, all of your healthcare providers, of all the medications you take. 7. Pain Medication from other Providers: You are not allowed to accept any additional pain medication from any other Doctor or Healthcare provider. There are two exceptions to this rule. (see below) In the event that you require additional pain medication, you are responsible for notifying us, as stated below. 8. Medication Agreement: You are responsible for carefully reading and following our Medication Agreement. This must be signed before receiving any prescriptions from our practice. Safely store a copy of your signed Agreement. Violations to the Agreement will result in no further prescriptions. (Additional copies of our Medication Agreement are available upon request.) 9. Laws, Rules, & Regulations: All patients are expected to follow all Federal and State Laws, Statutes, Rules, & Regulations. Ignorance of the Laws does not constitute a valid excuse. The use of any illegal substances is prohibited. 10. Adopted CDC guidelines & recommendations: Target dosing levels will be at or below 60 MME/day. Use of benzodiazepines** is not recommended.  Exceptions: There are only two exceptions to the rule of not receiving pain medications from other Healthcare Providers. 1. Exception #1 (Emergencies): In the event of an emergency (i.e.: accident requiring emergency care), you are allowed to receive additional pain medication. However, you are responsible for: As soon as you are able, call our office [phone], at any time of the day or night, and leave a message stating your name, the  date and nature of the emergency, and the name and dose of the medication   prescribed. In the event that your call is answered by a member of our staff, make sure to document and save the date, time, and the name of the person that took your information.  2. Exception #2 (Planned Surgery): In the event that you are scheduled by another doctor or dentist to have any type of surgery or procedure, you are allowed (for a period no longer than 30 days), to receive additional pain medication, for the acute post-op pain. However, in this case, you are responsible for picking up a copy of our "Post-op Pain Management for Surgeons" handout, and giving it to your surgeon or dentist. This document is available at our office, and does not require an appointment to obtain it. Simply go to our office during business hours (Monday-Thursday from 8:00 AM to 4:00 PM) (Friday 8:00 AM to 12:00 Noon) or if you have a scheduled appointment with [place], prior to your surgery, and ask for it by name. In addition, you will need to provide [place] with your name, name of your surgeon, type of surgery, and date of procedure or surgery.  *Opioid medications include: morphine, codeine, oxycodone, oxymorphone, hydrocodone, hydromorphone, meperidine, tramadol, tapentadol, buprenorphine, fentanyl, methadone. **Benzodiazepine medications include: diazepam (Valium), alprazolam (Xanax), clonazepam (Klonopine), lorazepam (Ativan), clorazepate (Tranxene), chlordiazepoxide (Librium), estazolam (Prosom), oxazepam (Serax), temazepam (Restoril), triazolam (Halcion) (Last updated: 04/10/2017) ____________________________________________________________________________________________    BMI Assessment: Estimated body mass index is 47.89 kg/m as calculated from the following:   Height as of this encounter: 5\' 4"  (1.626 m).   Weight as of this encounter: 279 lb (126.6 kg).  BMI interpretation table: BMI level Category Range association with higher  incidence of chronic pain  <18 kg/m2 Underweight   18.5-24.9 kg/m2 Ideal body weight   25-29.9 kg/m2 Overweight Increased incidence by 20%  30-34.9 kg/m2 Obese (Class I) Increased incidence by 68%  35-39.9 kg/m2 Severe obesity (Class II) Increased incidence by 136%  >40 kg/m2 Extreme obesity (Class III) Increased incidence by 254%   BMI Readings from Last 4 Encounters:  04/28/17 47.89 kg/m  03/06/17 47.20 kg/m  03/06/17 47.20 kg/m  01/18/17 47.20 kg/m   Wt Readings from Last 4 Encounters:  04/28/17 279 lb (126.6 kg)  03/06/17 275 lb (124.7 kg)  03/06/17 275 lb (124.7 kg)  01/18/17 275 lb (124.7 kg)   You were given 3 prescriptions for Oxycodone  and one for Lyrica today. Prescriptions for Robaxin and Amitriptyline were sent to your pharmacy.

## 2017-04-28 NOTE — Progress Notes (Signed)
Nursing Pain Medication Assessment:  Safety precautions to be maintained throughout the outpatient stay will include: orient to surroundings, keep bed in low position, maintain call bell within reach at all times, provide assistance with transfer out of bed and ambulation.  Medication Inspection Compliance: Pill count conducted under aseptic conditions, in front of the patient. Neither the pills nor the bottle was removed from the patient's sight at any time. Once count was completed pills were immediately returned to the patient in their original bottle.  Medication: Oxycodone IR Pill/Patch Count: 104 of 120 pills remain Pill/Patch Appearance: Markings consistent with prescribed medication Bottle Appearance: Standard pharmacy container. Clearly labeled. Filled Date: 03/14 / 2019 Last Medication intake:  Today

## 2017-04-28 NOTE — Progress Notes (Signed)
Patient's Name: Mary Cruz  MRN: [id number]  Referring Provider: Emeterio Reeve, DO  DOB: 03/05/1975  PCP: Emeterio Reeve, DO  DOS: 04/28/2017  Note by: Vevelyn Francois NP  Service setting: Ambulatory outpatient  Specialty: Interventional Pain Management  Location: [facility] (AMB) Pain Management Facility    Patient type: Established    Primary Reason(s) for Visit: Encounter for prescription drug management. (Level of risk: moderate)  CC: Back Pain (lower) and Neck Pain  HPI  [name] is a 42 y.o. year old, female patient, who comes today for a medication management evaluation. She has GERD; Irritable bowel syndrome; KIDNEY STONES; Essential hypertension, benign; Chronic pain syndrome; Generalized anxiety disorder; Obesity; Long term current use of opiate analgesic; Long term prescription opiate use; Opiate use (30 MME/Day); Opiate dependence ([place]); Encounter for therapeutic drug level monitoring; Musculoskeletal pain; Muscle cramping; Osteoarthrosis; Vitamin D insufficiency; Presence of functional implant ([employer] Lumbar spinal cord stimulator implant); Encounter for management of implanted device; Chronic low back pain (Bilateral) (R>L); Chronic lumbar radicular pain (Right L5 dermatome; Left S1 Dermatome) (Bilateral) (R>L); Chronic hip pain (Location of Tertiary source of pain) (Bilateral) (R>L); Chronic neck pain (Location of Primary Source of Pain) (Bilateral) (L>R); Chronic upper extremity pain (Location of Secondary source of pain) (Bilateral) (L>R); Morbid obesity ([place]); Nicotine dependence; Chronic cervical radicular pain (C7 Dermatome) (Bilateral) (L>R); Chronic lower extremity pain (Bilateral) (R>L); Right knee pain; Anxiety and depression; Implant pain at battery site (right buttocks area); Lumbar spondylosis; Lumbar facet syndrome (Bilateral) (R>L); Positive ANA (antinuclear antibody); Spinal cord stimulator status; Battery end of life of spinal cord stimulator; Neurogenic pain;  Fibromyalgia; Family history of thyroid disease; and Pain in right knee on their problem list. Her primarily concern today is the Back Pain (lower) and Neck Pain  Pain Assessment: Location:   Back(neck) Radiating: both hips and upper legs Onset: More than a month ago Duration: Chronic pain Quality: Aching, Sharp Severity: 5 /10 (self-reported pain score)  Note: Reported level is compatible with observation.                          Timing: Constant Modifying factors: lying down  Mary Cruz was last scheduled for an appointment on 03/06/2017 for medication management. During today's appointment we reviewed Mary Cruz chronic pain status, as well as her outpatient medication regimen. She states that her current muscle spasm; baclofen is not effective. She has failed all others except Robaxin. She had nausea and tinnitus with tizanidine. She admits that the Manuela Neptune was highly effective for her and she would like to go back to this.  The patient  reports that she does not use drugs. Her body mass index is 47.89 kg/m.  Further details on both, my assessment(s), as well as the proposed treatment plan, please see below.  Controlled Substance Pharmacotherapy Assessment REMS (Risk Evaluation and Mitigation Strategy)  Analgesic:Oxycodone IR 5 mg every 6 hours when necessary for pain. (20 mg/day) MME/day:30 mg/day.   Landis Martins, RN  04/28/2017 11:09 AM  Sign at close encounter Nursing Pain Medication Assessment:  Safety precautions to be maintained throughout the outpatient stay will include: orient to surroundings, keep bed in low position, maintain call bell within reach at all times, provide assistance with transfer out of bed and ambulation.  Medication Inspection Compliance: Pill count conducted under aseptic conditions, in front of the patient. Neither the pills nor the bottle was removed from the patient's sight at any time. Once count  was completed pills were immediately returned to  the patient in their original bottle.  Medication: Oxycodone IR Pill/Patch Count: 104 of 120 pills remain Pill/Patch Appearance: Markings consistent with prescribed medication Bottle Appearance: Standard pharmacy container. Clearly labeled. Filled Date: 03/14 / 2019 Last Medication intake:  Today   Pharmacokinetics: Liberation and absorption (onset of action): WNL Distribution (time to peak effect): WNL Metabolism and excretion (duration of action): WNL         Pharmacodynamics: Desired effects: Analgesia: Mary Cruz reports >50% benefit. Functional ability: Patient reports that medication allows her to accomplish basic ADLs Clinically meaningful improvement in function (CMIF): Sustained CMIF goals met Perceived effectiveness: Described as relatively effective, allowing for increase in activities of daily living (ADL) Undesirable effects: Side-effects or Adverse reactions: None reported Monitoring:  PMP: Online review of the past 56-monthperiod conducted. Compliant with practice rules and regulations Last UDS on record: Summary  Date Value Ref Range Status  03/06/2017 FINAL  Final    Comment:    ==================================================================== TOXASSURE SELECT 13 (MW) ==================================================================== Test                             Result       Flag       Units Drug Present and Declared for Prescription Verification   Oxycodone                      3225         EXPECTED   ng/mg creat   Oxymorphone                    746          EXPECTED   ng/mg creat   Noroxycodone                   3061         EXPECTED   ng/mg creat   Noroxymorphone                 326          EXPECTED   ng/mg creat    Sources of oxycodone are scheduled prescription medications.    Oxymorphone, noroxycodone, and noroxymorphone are expected    metabolites of oxycodone. Oxymorphone is also available as a    scheduled prescription  medication. ==================================================================== Test                      Result    Flag   Units      Ref Range   Creatinine              219              mg/dL      >=20 ==================================================================== Declared Medications:  The flagging and interpretation on this report are based on the  following declared medications.  Unexpected results may arise from  inaccuracies in the declared medications.  **Note: The testing scope of this panel includes these medications:  Oxycodone  **Note: The testing scope of this panel does not include following  reported medications:  Albuterol  Amitriptyline  Baclofen  Duloxetine  Omeprazole  Pregabalin  Vitamin D3 ==================================================================== For clinical consultation, please call ([phone] ====================================================================    UDS interpretation: Compliant          Medication Assessment Form: Reviewed. Patient indicates being compliant with therapy Treatment compliance: Compliant Risk  Assessment Profile: Aberrant behavior: See prior evaluations. None observed or detected today Comorbid factors increasing risk of overdose: See prior notes. No additional risks detected today Risk of substance use disorder (SUD): Low Opioid Risk Tool - 03/06/17 0938      Family History of Substance Abuse   Alcohol  Negative    Illegal Drugs  Negative    Rx Drugs  Negative      Personal History of Substance Abuse   Alcohol  Negative    Illegal Drugs  Negative    Rx Drugs  Negative      Age   Age between 59-45 years   Yes      History of Preadolescent Sexual Abuse   History of Preadolescent Sexual Abuse  Negative or Female      Psychological Disease   Psychological Disease  Positive anxiety   anxiety   ADD  Negative    OCD  Negative    Bipolar  Negative    Schizophrenia  Negative    Depression   Negative      Total Score   Opioid Risk Tool Scoring  3    Opioid Risk Interpretation  Low Risk      ORT Scoring interpretation table:  Score <3 = Low Risk for SUD  Score between 4-7 = Moderate Risk for SUD  Score >8 = High Risk for Opioid Abuse   Risk Mitigation Strategies:  Patient Counseling: Covered Patient-Prescriber Agreement (PPA): Present and active  Notification to other healthcare providers: Done  Pharmacologic Plan: No change in therapy, at this time.             Laboratory Chemistry  Inflammation Markers (CRP: Acute Phase) (ESR: Chronic Phase) Lab Results  Component Value Date   CRP 1.0 (H) 04/05/2015   ESRSEDRATE 35 (H) 04/05/2015   LATICACIDVEN 1.0 10/10/2013                         Rheumatology Markers Lab Results  Component Value Date   ANA POS (A) 09/11/2015                Renal Function Markers Lab Results  Component Value Date   BUN 16 10/24/2016   CREATININE 1.01 10/24/2016   GFRAA 81 10/24/2016   GFRNONAA 70 10/24/2016                 Hepatic Function Markers Lab Results  Component Value Date   AST 18 10/24/2016   ALT 16 10/24/2016   ALBUMIN 3.8 07/22/2016   ALKPHOS 96 07/22/2016                 Electrolytes Lab Results  Component Value Date   NA 138 10/24/2016   K 4.0 10/24/2016   CL 103 10/24/2016   CALCIUM 9.4 10/24/2016   MG 2.0 04/05/2015                        Neuropathy Markers No results found for: VITAMINB12, FOLATE, HGBA1C, HIV               Bone Pathology Markers Lab Results  Component Value Date   VD25OH 25 (L) 10/24/2016                         Coagulation Parameters Lab Results  Component Value Date   INR 1.05 06/21/2016   LABPROT 13.8 06/21/2016   APTT 29  06/21/2016   PLT 285 10/24/2016                 Cardiovascular Markers Lab Results  Component Value Date   HGB 11.8 10/24/2016   HCT 35.6 10/24/2016                 CA Markers No results found for: CEA, CA125, LABCA2               Note: Lab  results reviewed.  Recent Diagnostic Imaging Results  MR LUMBAR SPINE WO CONTRAST CLINICAL DATA:  Low back and bilateral hip pain with numbness and tingling in both legs for 1 year.  EXAM: MRI LUMBAR SPINE WITHOUT CONTRAST  TECHNIQUE: Multiplanar, multisequence MR imaging of the lumbar spine was performed. No intravenous contrast was administered.  COMPARISON:  CT lumbar spine 07/09/2012. Plain films lumbar spine 10/02/2016  FINDINGS: Segmentation:  Standard.  Alignment:  Normal.  Vertebrae: Height and signal are normal. No pars interarticularis defect.  Conus medullaris and cauda equina: Conus extends to the T12 level. Conus and cauda equina appear normal.  Paraspinal and other soft tissues: Spinal stimulator extending cephalad to T12 noted.  Disc levels:  Disc height and hydration are maintained at all levels. The central canal and foramina are widely patent at all levels. Mild to moderate bilateral facet degenerative change L5-S1 is noted.  IMPRESSION: Mild-to-moderate bilateral facet degenerative disease at L5-S1. The lumbar spine otherwise appears normal. The central canal and foramina are widely patent all levels.  Electronically Signed   By: Inge Rise M.D.   On: 04/07/2017 15:28  Complexity Note: Imaging results reviewed. Results shared with Ms. Abundis, using Layman's terms.                         Meds   Current Outpatient Medications:  .  albuterol (PROVENTIL HFA;VENTOLIN HFA) 108 (90 Base) MCG/ACT inhaler, Inhale 1-2 puffs into the lungs every 6 (six) hours as needed for wheezing or shortness of breath., Disp: 1 Inhaler, Rfl: 0 .  amitriptyline (ELAVIL) 100 MG tablet, TAKE 1 TABLET (100 MG TOTAL) BY MOUTH AT BEDTIME., Disp: 90 tablet, Rfl: 2 .  Cholecalciferol (VITAMIN D3) 2000 units TABS, Take 1 tablet by mouth daily., Disp: , Rfl:  .  DULoxetine (CYMBALTA) 60 MG capsule, Take 1 capsule (60 mg total) by mouth 2 (two) times daily., Disp: 180  capsule, Rfl: 3 .  Ibuprofen-Famotidine 800-26.6 MG TABS, Take by mouth 3 (three) times daily., Disp: , Rfl:  .  omeprazole (PRILOSEC) 40 MG capsule, Take 1 capsule (40 mg total) by mouth daily., Disp: 90 capsule, Rfl: 3 .  [START ON 06/23/2017] oxyCODONE (OXY IR/ROXICODONE) 5 MG immediate release tablet, Take 1 tablet (5 mg total) by mouth every 6 (six) hours as needed for severe pain., Disp: 120 tablet, Rfl: 0 .  [START ON 05/24/2017] oxyCODONE (OXY IR/ROXICODONE) 5 MG immediate release tablet, Take 1 tablet (5 mg total) by mouth every 6 (six) hours as needed for severe pain., Disp: 120 tablet, Rfl: 0 .  [START ON 05/28/2017] pregabalin (LYRICA) 150 MG capsule, Take 1 capsule (150 mg total) by mouth every 8 (eight) hours., Disp: 90 capsule, Rfl: 1 .  methocarbamol (ROBAXIN) 500 MG tablet, Take 1 tablet (500 mg total) by mouth every 8 (eight) hours as needed for muscle spasms., Disp: 90 tablet, Rfl: 0 .  [START ON 07/23/2017] oxyCODONE (OXY IR/ROXICODONE) 5 MG immediate release tablet, Take 1 tablet (5  mg total) by mouth every 4 (four) hours as needed for severe pain., Disp: 120 tablet, Rfl: 0  ROS  Constitutional: Denies any fever or chills Gastrointestinal: No reported hemesis, hematochezia, vomiting, or acute GI distress Musculoskeletal: Denies any acute onset joint swelling, redness, loss of ROM, or weakness Neurological: No reported episodes of acute onset apraxia, aphasia, dysarthria, agnosia, amnesia, paralysis, loss of coordination, or loss of consciousness  Allergies  Mary Cruz is allergic to keflex [cephalexin]; amoxicillin; celebrex [celecoxib]; pentazocine lactate; bactrim [sulfamethoxazole-trimethoprim]; doxycycline; erythromycin; hydromorphone hcl; latex; and nabumetone.  [place]  Drug: Mary Cruz  reports that she does not use drugs. Alcohol:  reports that she does not drink alcohol. Tobacco:  reports that she quit smoking about 3 months ago. Her smoking use included cigarettes. she  has never used smokeless tobacco. Medical:  has a past medical history of Abscess of axillary fold (10/10/2013), Acute bronchitis with asthma with acute exacerbation (01/23/2015), Fibromyalgia, Hypertension, Lupus, Osteoarthritis, Pain, and Sepsis affecting skin ([place]) (10/10/2013). Surgical: Mary Cruz  has a past surgical history that includes spinal cord stimulator; Occipital nerve stimulator insertion; Cholecystectomy (2000); Knee surgery; Shoulder surgery; Tubal ligation (2000); Endometrial ablation; Spinal cord stimulator implant (2008); Knee surgery (Right, 02-2015); Breast surgery; and Spinal cord stimulator insertion (N/A, 06/21/2016). Family: family history includes Asthma in her mother; Depression in her mother; Fibromyalgia in her unknown relative; Stroke in her unknown relative; Thyroid disease in her unknown relative.  Constitutional Exam  General appearance: Well nourished, well developed, and well hydrated. In no apparent acute distress Vitals:   04/28/17 1057  BP: (!) 157/86  Pulse: 91  Resp: 18  Temp: 98 F (36.7 C)  TempSrc: Oral  SpO2: 100%  Weight: 279 lb (126.6 kg)  Height: 5' 4"  (1.626 m)  Psych/Mental status: Alert, oriented x 3 (person, place, & time)       Eyes: PERLA Respiratory: No evidence of acute respiratory distress  Cervical Spine Area Exam  Skin & Axial Inspection: No masses, redness, edema, swelling, or associated skin lesions Alignment: Symmetrical Functional ROM: Unrestricted ROM      Stability: No instability detected Muscle Tone/Strength: Functionally intact. No obvious neuro-muscular anomalies detected. Sensory (Neurological): Unimpaired Palpation: No palpable anomalies              Upper Extremity (UE) Exam    Side: Right upper extremity  Side: Left upper extremity  Skin & Extremity Inspection: Skin color, temperature, and hair growth are WNL. No peripheral edema or cyanosis. No masses, redness, swelling, asymmetry, or associated skin lesions. No  contractures.  Skin & Extremity Inspection: Skin color, temperature, and hair growth are WNL. No peripheral edema or cyanosis. No masses, redness, swelling, asymmetry, or associated skin lesions. No contractures.  Functional ROM: Unrestricted ROM          Functional ROM: Unrestricted ROM          Muscle Tone/Strength: Functionally intact. No obvious neuro-muscular anomalies detected.  Muscle Tone/Strength: Functionally intact. No obvious neuro-muscular anomalies detected.  Sensory (Neurological): Unimpaired          Sensory (Neurological): Unimpaired          Palpation: No palpable anomalies              Palpation: No palpable anomalies              Specialized Test(s): Deferred         Specialized Test(s): Deferred          Thoracic Spine Area Exam  Skin & Axial Inspection: No masses, redness, or swelling Alignment: Symmetrical Functional ROM: Unrestricted ROM Stability: No instability detected Muscle Tone/Strength: Functionally intact. No obvious neuro-muscular anomalies detected. Sensory (Neurological): Unimpaired Muscle strength & Tone: No palpable anomalies  Lumbar Spine Area Exam  Skin & Axial Inspection: No masses, redness, or swelling Alignment: Symmetrical Functional ROM: Adequate ROM      Stability: No instability detected Muscle Tone/Strength: Functionally intact. No obvious neuro-muscular anomalies detected. Sensory (Neurological): Unimpaired Palpation: Complains of area being tender to palpation       Provocative Tests: Lumbar Hyperextension and rotation test: Positive bilaterally for facet joint pain. greater on the right Lumbar Lateral bending test: evaluation deferred today       Patrick's Maneuver: evaluation deferred today                    Gait & Posture Assessment  Ambulation: Unassisted Gait: Relatively normal for age and body habitus Posture: WNL   Lower Extremity Exam    Side: Right lower extremity  Side: Left lower extremity  Skin & Extremity Inspection:  Skin color, temperature, and hair growth are WNL. No peripheral edema or cyanosis. No masses, redness, swelling, asymmetry, or associated skin lesions. No contractures.  Skin & Extremity Inspection: Skin color, temperature, and hair growth are WNL. No peripheral edema or cyanosis. No masses, redness, swelling, asymmetry, or associated skin lesions. No contractures.  Functional ROM: Unrestricted ROM          Functional ROM: Unrestricted ROM          Muscle Tone/Strength: Guarding  Muscle Tone/Strength: TEFL teacher (Neurological): Unimpaired  Sensory (Neurological): Unimpaired  Palpation: No palpable anomalies  Palpation: No palpable anomalies   Assessment  Primary Diagnosis & Pertinent Problem List: The primary encounter diagnosis was Lumbar spondylosis. Diagnoses of Chronic low back pain (Bilateral) (R>L), Musculoskeletal pain, Chronic pain syndrome, Chronic pain of both hips, Neurogenic pain, Fibromyalgia, and Long term current use of opiate analgesic were also pertinent to this visit.  Status Diagnosis  Controlled Controlled Controlled 1. Lumbar spondylosis   2. Chronic low back pain (Bilateral) (R>L)   3. Musculoskeletal pain   4. Chronic pain syndrome   5. Chronic pain of both hips   6. Neurogenic pain   7. Fibromyalgia   8. Long term current use of opiate analgesic     Problems updated and reviewed during this visit: Problem  Neurogenic Pain  Fibromyalgia  Family History of Thyroid Disease  Pain in Right Knee   Plan of Care  Pharmacotherapy (Medications Ordered): Meds ordered this encounter  Medications  . oxyCODONE (OXY IR/ROXICODONE) 5 MG immediate release tablet    Sig: Take 1 tablet (5 mg total) by mouth every 4 (four) hours as needed for severe pain.    Dispense:  120 tablet    Refill:  0    Patient may have prescription filled one day early if pharmacy is closed on scheduled refill date. Do not fill until: 07/23/2017 To last until: 08/22/2017    Order Specific  Question:   Supervising Provider    Answer:   Milinda Pointer [phone]  . oxyCODONE (OXY IR/ROXICODONE) 5 MG immediate release tablet    Sig: Take 1 tablet (5 mg total) by mouth every 6 (six) hours as needed for severe pain.    Dispense:  120 tablet    Refill:  0    Do not add this medication to the electronic "Automatic Refill" notification system. Patient may have prescription  filled one day early if pharmacy is closed on scheduled refill date. Do not fill until: 06/23/2017 To last until: 07/23/2017    Order Specific Question:   Supervising Provider    Answer:   Milinda Pointer [phone]  . oxyCODONE (OXY IR/ROXICODONE) 5 MG immediate release tablet    Sig: Take 1 tablet (5 mg total) by mouth every 6 (six) hours as needed for severe pain.    Dispense:  120 tablet    Refill:  0    Do not add this medication to the electronic "Automatic Refill" notification system. Patient may have prescription filled one day early if pharmacy is closed on scheduled refill date. Do not fill until: 05/24/2017 To last until: 06/23/2017    Order Specific Question:   Supervising Provider    Answer:   Milinda Pointer [phone]  . pregabalin (LYRICA) 150 MG capsule    Sig: Take 1 capsule (150 mg total) by mouth every 8 (eight) hours.    Dispense:  90 capsule    Refill:  1    Do not place medication on "Automatic Refill". Fill one day early if pharmacy is closed on scheduled refill date.    Order Specific Question:   Supervising Provider    Answer:   Milinda Pointer [phone]  . methocarbamol (ROBAXIN) 500 MG tablet    Sig: Take 1 tablet (500 mg total) by mouth every 8 (eight) hours as needed for muscle spasms.    Dispense:  90 tablet    Refill:  0    Do not place this medication, or any other prescription from our practice, on "Automatic Refill". Patient may have prescription filled one day early if pharmacy is closed on scheduled refill date.    Order Specific Question:   Supervising Provider     Answer:   Milinda Pointer [phone]  . amitriptyline (ELAVIL) 100 MG tablet    Sig: TAKE 1 TABLET (100 MG TOTAL) BY MOUTH AT BEDTIME.    Dispense:  90 tablet    Refill:  2    Order Specific Question:   Supervising Provider    Answer:   Milinda Pointer [phone]   New Prescriptions   METHOCARBAMOL (ROBAXIN) 500 MG TABLET    Take 1 tablet (500 mg total) by mouth every 8 (eight) hours as needed for muscle spasms.   Medications administered today: Emelia Loron had no medications administered during this visit. Lab-work, procedure(s), and/or referral(s): Orders Placed This Encounter  Procedures  . ToxASSURE Select 13 (MW), Urine   Imaging and/or referral(s): None  Interventional therapies: Planned, scheduled, and/or pending:  Not at time.   Considering:  Palliative Bilateral lumbar facet radiofrequency.    Palliative PRN treatment(s):  Palliative left-sided C7-T1 cervical epidural steroid injection   Provider-requested follow-up: Return in about 3 months (around 07/29/2017) for MedMgmt with Mary Cruz).  Future Appointments  Date Time Provider [place]  07/24/2017 10:30 AM Vevelyn Francois, NP [facility] None   Primary Care Physician: Emeterio Reeve, DO Location: [facility] Note by: Vevelyn Francois NP Date: 04/28/2017; Time: 1:35 PM  Pain Score Disclaimer: We use the NRS-11 scale. This is a self-reported, subjective measurement of pain severity with only modest accuracy. It is used primarily to identify changes within a particular patient. It must be understood that outpatient pain scales are significantly less accurate that those used for research, where they can be applied under ideal controlled circumstances with minimal exposure to variables. In reality, the score  is likely to be a combination of pain intensity and pain affect, where pain affect describes the degree of emotional arousal or changes in action  readiness caused by the sensory experience of pain. Factors such as social and work situation, setting, emotional state, anxiety levels, expectation, and prior pain experience may influence pain perception and show large inter-individual differences that may also be affected by time variables.  Patient instructions provided during this appointment: Patient Instructions   ____________________________________________________________________________________________  Medication Rules  Applies to: All patients receiving prescriptions (written or electronic).  Pharmacy of record: Pharmacy where electronic prescriptions will be sent. If written prescriptions are taken to a different pharmacy, please inform the nursing staff. The pharmacy listed in the electronic medical record should be the one where you would like electronic prescriptions to be sent.  Prescription refills: Only during scheduled appointments. Applies to both, written and electronic prescriptions.  NOTE: The following applies primarily to controlled substances (Opioid* Pain Medications).   Patient's responsibilities: 1. Pain Pills: Bring all pain pills to every appointment (except for procedure appointments). 2. Pill Bottles: Bring pills in original pharmacy bottle. Always bring newest bottle. Bring bottle, even if empty. 3. Medication refills: You are responsible for knowing and keeping track of what medications you need refilled. The day before your appointment, write a list of all prescriptions that need to be refilled. Bring that list to your appointment and give it to the admitting nurse. Prescriptions will be written only during appointments. If you forget a medication, it will not be "Called in", "Faxed", or "electronically sent". You will need to get another appointment to get these prescribed. 4. Prescription Accuracy: You are responsible for carefully inspecting your prescriptions before leaving our office. Have the discharge  nurse carefully go over each prescription with you, before taking them home. Make sure that your name is accurately spelled, that your address is correct. Check the name and dose of your medication to make sure it is accurate. Check the number of pills, and the written instructions to make sure they are clear and accurate. Make sure that you are given enough medication to last until your next medication refill appointment. 5. Taking Medication: Take medication as prescribed. Never take more pills than instructed. Never take medication more frequently than prescribed. Taking less pills or less frequently is permitted and encouraged, when it comes to controlled substances (written prescriptions).  6. Inform other Doctors: Always inform, all of your healthcare providers, of all the medications you take. 7. Pain Medication from other Providers: You are not allowed to accept any additional pain medication from any other Doctor or Healthcare provider. There are two exceptions to this rule. (see below) In the event that you require additional pain medication, you are responsible for notifying us, as stated below. 8. Medication Agreement: You are responsible for carefully reading and following our Medication Agreement. This must be signed before receiving any prescriptions from our practice. Safely store a copy of your signed Agreement. Violations to the Agreement will result in no further prescriptions. (Additional copies of our Medication Agreement are available upon request.) 9. Laws, Rules, & Regulations: All patients are expected to follow all Federal and [employer], [employer], Rules, [employer]. Ignorance of the Laws does not constitute a valid excuse. The use of any illegal substances is prohibited. 10. Adopted CDC guidelines & recommendations: Target dosing levels will be at or below 60 MME/day. Use of benzodiazepines** is not recommended.  Exceptions: There are only two exceptions to the rule  of not  receiving pain medications from other Healthcare Providers. 1. Exception #1 (Emergencies): In the event of an emergency (i.e.: accident requiring emergency care), you are allowed to receive additional pain medication. However, you are responsible for: As soon as you are able, call our office [phone], at any time of the day or night, and leave a message stating your name, the date and nature of the emergency, and the name and dose of the medication prescribed. In the event that your call is answered by a member of our staff, make sure to document and save the date, time, and the name of the person that took your information.  2. Exception #2 (Planned Surgery): In the event that you are scheduled by another doctor or dentist to have any type of surgery or procedure, you are allowed (for a period no longer than 30 days), to receive additional pain medication, for the acute post-op pain. However, in this case, you are responsible for picking up a copy of our "Post-op Pain Management for Surgeons" handout, and giving it to your surgeon or dentist. This document is available at our office, and does not require an appointment to obtain it. Simply go to our office during business hours (Monday-Thursday from 8:00 AM to 4:00 PM) (Friday 8:00 AM to 12:00 Noon) or if you have a scheduled appointment with [place], prior to your surgery, and ask for it by name. In addition, you will need to provide [place] with your name, name of your surgeon, type of surgery, and date of procedure or surgery.  *Opioid medications include: morphine, codeine, oxycodone, oxymorphone, hydrocodone, hydromorphone, meperidine, tramadol, tapentadol, buprenorphine, fentanyl, methadone. **Benzodiazepine medications include: diazepam (Valium), alprazolam (Xanax), clonazepam (Klonopine), lorazepam (Ativan), clorazepate (Tranxene), chlordiazepoxide (Librium), estazolam (Prosom), oxazepam (Serax), temazepam (Restoril), triazolam (Halcion) (Last updated:  04/10/2017) ____________________________________________________________________________________________    BMI Assessment: Estimated body mass index is 47.89 kg/m as calculated from the following:   Height as of this encounter: 5' 4"  (1.626 m).   Weight as of this encounter: 279 lb (126.6 kg).  BMI interpretation table: BMI level Category Range association with higher incidence of chronic pain  <18 kg/m2 Underweight   18.5-24.9 kg/m2 Ideal body weight   25-29.9 kg/m2 Overweight Increased incidence by 20%  30-34.9 kg/m2 Obese (Class I) Increased incidence by 68%  35-39.9 kg/m2 Severe obesity (Class II) Increased incidence by 136%  >40 kg/m2 Extreme obesity (Class III) Increased incidence by 254%   BMI Readings from Last 4 Encounters:  04/28/17 47.89 kg/m  03/06/17 47.20 kg/m  03/06/17 47.20 kg/m  01/18/17 47.20 kg/m   Wt Readings from Last 4 Encounters:  04/28/17 279 lb (126.6 kg)  03/06/17 275 lb (124.7 kg)  03/06/17 275 lb (124.7 kg)  01/18/17 275 lb (124.7 kg)   You were given 3 prescriptions for Oxycodone  and one for Lyrica today. Prescriptions for Robaxin and Amitriptyline were sent to your pharmacy.

## 2017-05-02 LAB — TOXASSURE SELECT 13 (MW), URINE

## 2017-05-22 ENCOUNTER — Telehealth: Payer: Self-pay | Admitting: Nurse Practitioner

## 2017-05-22 ENCOUNTER — Other Ambulatory Visit: Payer: Self-pay | Admitting: Nurse Practitioner

## 2017-05-22 MED ORDER — METHOCARBAMOL 500 MG PO TABS: 500.0000 mg | ORAL_TABLET | Freq: Three times a day (TID) | ORAL | 1 refills | Status: DC | PRN

## 2017-05-22 NOTE — Telephone Encounter (Signed)
Please advise 

## 2017-05-22 NOTE — Telephone Encounter (Signed)
Refill sent.

## 2017-05-22 NOTE — Telephone Encounter (Signed)
Muscle relaxer does work and she needs refill called in to pharmacy please. As this was trial to see if it would help. Please let patient know when she can pick up

## 2017-05-26 NOTE — Telephone Encounter (Signed)
Patient notified per voicemail that Robaxin sent to pharmacy.

## 2017-05-29 ENCOUNTER — Encounter: Payer: BLUE CROSS/BLUE SHIELD | Admitting: Nurse Practitioner

## 2017-07-04 ENCOUNTER — Telehealth: Payer: Self-pay | Admitting: Nurse Practitioner

## 2017-07-04 NOTE — Telephone Encounter (Signed)
Pharmacy lvmail stating they need to verify script turned in.

## 2017-07-08 NOTE — Telephone Encounter (Signed)
Called to pharmacy, they could not find anything that needed clarification.

## 2017-07-24 ENCOUNTER — Other Ambulatory Visit: Payer: Self-pay

## 2017-07-24 ENCOUNTER — Encounter: Payer: Self-pay | Admitting: Nurse Practitioner

## 2017-07-24 ENCOUNTER — Ambulatory Visit: Payer: BLUE CROSS/BLUE SHIELD | Attending: Nurse Practitioner | Admitting: Nurse Practitioner

## 2017-07-24 VITALS — BP 139/92 | HR 100 | Temp 98.3°F | Resp 16 | Ht 64.0 in | Wt 266.0 lb

## 2017-07-24 DIAGNOSIS — M79602 Pain in left arm: Secondary | ICD-10-CM | POA: Insufficient documentation

## 2017-07-24 DIAGNOSIS — Z79891 Long term (current) use of opiate analgesic: Secondary | ICD-10-CM | POA: Diagnosis not present

## 2017-07-24 DIAGNOSIS — Z6841 Body Mass Index (BMI) 40.0 and over, adult: Secondary | ICD-10-CM | POA: Diagnosis not present

## 2017-07-24 DIAGNOSIS — M797 Fibromyalgia: Secondary | ICD-10-CM

## 2017-07-24 DIAGNOSIS — K219 Gastro-esophageal reflux disease without esophagitis: Secondary | ICD-10-CM | POA: Diagnosis not present

## 2017-07-24 DIAGNOSIS — M1990 Unspecified osteoarthritis, unspecified site: Secondary | ICD-10-CM | POA: Insufficient documentation

## 2017-07-24 DIAGNOSIS — M542 Cervicalgia: Secondary | ICD-10-CM | POA: Diagnosis not present

## 2017-07-24 DIAGNOSIS — M79601 Pain in right arm: Secondary | ICD-10-CM | POA: Insufficient documentation

## 2017-07-24 DIAGNOSIS — Z5181 Encounter for therapeutic drug level monitoring: Secondary | ICD-10-CM | POA: Insufficient documentation

## 2017-07-24 DIAGNOSIS — Z91040 Latex allergy status: Secondary | ICD-10-CM | POA: Insufficient documentation

## 2017-07-24 DIAGNOSIS — F411 Generalized anxiety disorder: Secondary | ICD-10-CM | POA: Insufficient documentation

## 2017-07-24 DIAGNOSIS — M79605 Pain in left leg: Secondary | ICD-10-CM | POA: Diagnosis not present

## 2017-07-24 DIAGNOSIS — M25561 Pain in right knee: Secondary | ICD-10-CM | POA: Diagnosis not present

## 2017-07-24 DIAGNOSIS — M5416 Radiculopathy, lumbar region: Secondary | ICD-10-CM | POA: Insufficient documentation

## 2017-07-24 DIAGNOSIS — Z888 Allergy status to other drugs, medicaments and biological substances status: Secondary | ICD-10-CM | POA: Insufficient documentation

## 2017-07-24 DIAGNOSIS — Z79899 Other long term (current) drug therapy: Secondary | ICD-10-CM | POA: Insufficient documentation

## 2017-07-24 DIAGNOSIS — G629 Polyneuropathy, unspecified: Secondary | ICD-10-CM | POA: Diagnosis not present

## 2017-07-24 DIAGNOSIS — M25552 Pain in left hip: Secondary | ICD-10-CM | POA: Diagnosis not present

## 2017-07-24 DIAGNOSIS — F419 Anxiety disorder, unspecified: Secondary | ICD-10-CM

## 2017-07-24 DIAGNOSIS — M25551 Pain in right hip: Secondary | ICD-10-CM | POA: Insufficient documentation

## 2017-07-24 DIAGNOSIS — G894 Chronic pain syndrome: Secondary | ICD-10-CM

## 2017-07-24 DIAGNOSIS — K589 Irritable bowel syndrome without diarrhea: Secondary | ICD-10-CM | POA: Insufficient documentation

## 2017-07-24 DIAGNOSIS — F32A Depression, unspecified: Secondary | ICD-10-CM

## 2017-07-24 DIAGNOSIS — M792 Neuralgia and neuritis, unspecified: Secondary | ICD-10-CM | POA: Diagnosis not present

## 2017-07-24 DIAGNOSIS — F17210 Nicotine dependence, cigarettes, uncomplicated: Secondary | ICD-10-CM | POA: Insufficient documentation

## 2017-07-24 DIAGNOSIS — M47896 Other spondylosis, lumbar region: Secondary | ICD-10-CM | POA: Insufficient documentation

## 2017-07-24 DIAGNOSIS — M47816 Spondylosis without myelopathy or radiculopathy, lumbar region: Secondary | ICD-10-CM | POA: Diagnosis not present

## 2017-07-24 DIAGNOSIS — Z9049 Acquired absence of other specified parts of digestive tract: Secondary | ICD-10-CM | POA: Insufficient documentation

## 2017-07-24 DIAGNOSIS — F329 Major depressive disorder, single episode, unspecified: Secondary | ICD-10-CM | POA: Diagnosis not present

## 2017-07-24 DIAGNOSIS — Z880 Allergy status to penicillin: Secondary | ICD-10-CM | POA: Insufficient documentation

## 2017-07-24 DIAGNOSIS — Z882 Allergy status to sulfonamides status: Secondary | ICD-10-CM | POA: Insufficient documentation

## 2017-07-24 DIAGNOSIS — I10 Essential (primary) hypertension: Secondary | ICD-10-CM | POA: Insufficient documentation

## 2017-07-24 DIAGNOSIS — M79604 Pain in right leg: Secondary | ICD-10-CM | POA: Insufficient documentation

## 2017-07-24 MED ORDER — OXYCODONE HCL 5 MG PO TABS: 5.0000 mg | ORAL_TABLET | Freq: Four times a day (QID) | ORAL | 0 refills | Status: DC | PRN

## 2017-07-24 MED ORDER — OXYCODONE HCL 5 MG PO TABS: 5.0000 mg | ORAL_TABLET | ORAL | 0 refills | Status: DC | PRN

## 2017-07-24 MED ORDER — METHOCARBAMOL 750 MG PO TABS
750.0000 mg | ORAL_TABLET | Freq: Three times a day (TID) | ORAL | 0 refills | Status: DC | PRN
Start: 2017-07-24 — End: 2017-08-27

## 2017-07-24 MED ORDER — PREGABALIN 150 MG PO CAPS: 150.0000 mg | ORAL_CAPSULE | Freq: Three times a day (TID) | ORAL | 1 refills | Status: DC

## 2017-07-24 NOTE — Patient Instructions (Addendum)
_You have been given prescription for Oxycodone 5mg  to last until 11/30/2017 and Lyrica. _Robaxin 750mg  sent to pharmacy.  ____________________________________________________________________________________________  Preparing for Procedure with Sedation  Instructions: . Oral Intake: Do not eat or drink anything for at least 8 hours prior to your procedure. . Transportation: Public transportation is not allowed. Bring an adult driver. The driver must be physically present in our waiting room before any procedure can be started. [name] Physical Assistance: Bring an adult physically capable of assisting you, in the event you need help. This adult should keep you company at home for at least 6 hours after the procedure. . Blood Pressure Medicine: Take your blood pressure medicine with a sip of water the morning of the procedure. . Blood thinners:  . Diabetics on insulin: Notify the staff so that you can be scheduled 1st case in the morning. If your diabetes requires high dose insulin, take only  of your normal insulin dose the morning of the procedure and notify the staff that you have done so. . Preventing infections: Shower with an antibacterial soap the morning of your procedure. . Build-up your immune system: Take 1000 mg of Vitamin C with every meal (3 times a day) the day prior to your procedure. [name] Antibiotics: Inform the staff if you have a condition or reason that requires you to take antibiotics before dental procedures. . Pregnancy: If you are pregnant, call and cancel the procedure. . Sickness: If you have a cold, fever, or any active infections, call and cancel the procedure. . Arrival: You must be in the facility at least 30 minutes prior to your scheduled procedure. . Children: Do not bring children with you. . Dress appropriately: Bring dark clothing that you would not mind if they get stained. . Valuables: Do not bring any jewelry or valuables.  Procedure appointments are reserved for  interventional treatments only. [name] No Prescription Refills. . No medication changes will be discussed during procedure appointments. . No disability issues will be discussed.  Remember:  Regular Business hours are:  Monday to Thursday 8:00 AM to 4:00 PM  Provider's Schedule: Milinda Pointer, MD:  Procedure days: Tuesday and Thursday 7:30 AM to 4:00 PM  Gillis Santa, MD:  Procedure days: Monday and Wednesday 7:30 AM to 4:00 PM ____________________________________________________________________________________________     Radiofrequency Lesioning Radiofrequency lesioning is a procedure that is performed to relieve pain. The procedure is often used for back, neck, or arm pain. Radiofrequency lesioning involves the use of a machine that creates radio waves to make heat. During the procedure, the heat is applied to the nerve that carries the pain signal. The heat damages the nerve and interferes with the pain signal. Pain relief usually starts about 2 weeks after the procedure and lasts for 6 months to 1 year. Tell a health care provider about:  Any allergies you have.  All medicines you are taking, including vitamins, herbs, eye drops, creams, and over-the-counter medicines.  Any problems you or family members have had with anesthetic medicines.  Any blood disorders you have.  Any surgeries you have had.  Any medical conditions you have.  Whether you are pregnant or may be pregnant. What are the risks? Generally, this is a safe procedure. However, problems may occur, including:  Pain or soreness at the injection site.  Infection at the injection site.  Damage to nerves or blood vessels.  What happens before the procedure?  Ask your health care provider about: ? Changing or stopping your regular medicines.  This is especially important if you are taking diabetes medicines or blood thinners. ? Taking medicines such as aspirin and ibuprofen. These medicines can thin your  blood. Do not take these medicines before your procedure if your health care provider instructs you not to.  Follow instructions from your health care provider about eating or drinking restrictions.  Plan to have someone take you home after the procedure.  If you go home right after the procedure, plan to have someone with you for 24 hours. What happens during the procedure?  You will be given one or more of the following: ? A medicine to help you relax (sedative). ? A medicine to numb the area (local anesthetic).  You will be awake during the procedure. You will need to be able to talk with the health care provider during the procedure.  With the help of a type of X-ray (fluoroscopy), the health care provider will insert a radiofrequency needle into the area to be treated.  Next, a wire that carries the radio waves (electrode) will be put through the radiofrequency needle. An electrical pulse will be sent through the electrode to verify the correct nerve. You will feel a tingling sensation, and you may have muscle twitching.  Then, the tissue that is around the needle tip will be heated by an electric current that is passed using the radiofrequency machine. This will numb the nerves.  A bandage (dressing) will be put on the insertion area after the procedure is done. The procedure may vary among health care providers and hospitals. What happens after the procedure?  Your blood pressure, heart rate, breathing rate, and blood oxygen level will be monitored often until the medicines you were given have worn off.  Return to your normal activities as directed by your health care provider. This information is not intended to replace advice given to you by your health care provider. Make sure you discuss any questions you have with your health care provider. Document Released: 09/26/2010 Document Revised: 07/06/2015 Document Reviewed: 03/07/2014 Elsevier Interactive Patient Education  2018  [employer].  __________________________________________________________________________________________  Medication Rules  Applies to: All patients receiving prescriptions (written or electronic).  Pharmacy of record: Pharmacy where electronic prescriptions will be sent. If written prescriptions are taken to a different pharmacy, please inform the nursing staff. The pharmacy listed in the electronic medical record should be the one where you would like electronic prescriptions to be sent.  Prescription refills: Only during scheduled appointments. Applies to both, written and electronic prescriptions.  NOTE: The following applies primarily to controlled substances (Opioid* Pain Medications).   Patient's responsibilities: 1. Pain Pills: Bring all pain pills to every appointment (except for procedure appointments). 2. Pill Bottles: Bring pills in original pharmacy bottle. Always bring newest bottle. Bring bottle, even if empty. 3. Medication refills: You are responsible for knowing and keeping track of what medications you need refilled. The day before your appointment, write a list of all prescriptions that need to be refilled. Bring that list to your appointment and give it to the admitting nurse. Prescriptions will be written only during appointments. If you forget a medication, it will not be "Called in", "Faxed", or "electronically sent". You will need to get another appointment to get these prescribed. 4. Prescription Accuracy: You are responsible for carefully inspecting your prescriptions before leaving our office. Have the discharge nurse carefully go over each prescription with you, before taking them home. Make sure that your name is accurately spelled, that your address  is correct. Check the name and dose of your medication to make sure it is accurate. Check the number of pills, and the written instructions to make sure they are clear and accurate. Make sure that you are given enough  medication to last until your next medication refill appointment. 5. Taking Medication: Take medication as prescribed. Never take more pills than instructed. Never take medication more frequently than prescribed. Taking less pills or less frequently is permitted and encouraged, when it comes to controlled substances (written prescriptions).  6. Inform other Doctors: Always inform, all of your healthcare providers, of all the medications you take. 7. Pain Medication from other Providers: You are not allowed to accept any additional pain medication from any other Doctor or Healthcare provider. There are two exceptions to this rule. (see below) In the event that you require additional pain medication, you are responsible for notifying us, as stated below. 8. Medication Agreement: You are responsible for carefully reading and following our Medication Agreement. This must be signed before receiving any prescriptions from our practice. Safely store a copy of your signed Agreement. Violations to the Agreement will result in no further prescriptions. (Additional copies of our Medication Agreement are available upon request.) 9. Laws, Rules, & Regulations: All patients are expected to follow all Federal and [employer], [employer], Rules, [employer]. Ignorance of the Laws does not constitute a valid excuse. The use of any illegal substances is prohibited. 10. Adopted CDC guidelines & recommendations: Target dosing levels will be at or below 60 MME/day. Use of benzodiazepines** is not recommended.  Exceptions: There are only two exceptions to the rule of not receiving pain medications from other Healthcare Providers. 1. Exception #1 (Emergencies): In the event of an emergency (i.e.: accident requiring emergency care), you are allowed to receive additional pain medication. However, you are responsible for: As soon as you are able, call our office [phone], at any time of the day or night, and leave a message  stating your name, the date and nature of the emergency, and the name and dose of the medication prescribed. In the event that your call is answered by a member of our staff, make sure to document and save the date, time, and the name of the person that took your information.  2. Exception #2 (Planned Surgery): In the event that you are scheduled by another doctor or dentist to have any type of surgery or procedure, you are allowed (for a period no longer than 30 days), to receive additional pain medication, for the acute post-op pain. However, in this case, you are responsible for picking up a copy of our "Post-op Pain Management for Surgeons" handout, and giving it to your surgeon or dentist. This document is available at our office, and does not require an appointment to obtain it. Simply go to our office during business hours (Monday-Thursday from 8:00 AM to 4:00 PM) (Friday 8:00 AM to 12:00 Noon) or if you have a scheduled appointment with [place], prior to your surgery, and ask for it by name. In addition, you will need to provide [place] with your name, name of your surgeon, type of surgery, and date of procedure or surgery.  *Opioid medications include: morphine, codeine, oxycodone, oxymorphone, hydrocodone, hydromorphone, meperidine, tramadol, tapentadol, buprenorphine, fentanyl, methadone. **Benzodiazepine medications include: diazepam (Valium), alprazolam (Xanax), clonazepam (Klonopine), lorazepam (Ativan), clorazepate (Tranxene), chlordiazepoxide (Librium), estazolam (Prosom), oxazepam (Serax), temazepam (Restoril), triazolam (Halcion) (Last updated: 04/10/2017) ____________________________________________________________________________________________   ____________________________________________________________________________________________  Pain Scale  Introduction: The pain  score used by this practice is the Verbal Numerical Rating Scale (VNRS-11). This is an 11-point scale. It is for adults  and children 10 years or older. There are significant differences in how the pain score is reported, used, and applied. Forget everything you learned in the past and learn this scoring system.  General Information: The scale should reflect your current level of pain. Unless you are specifically asked for the level of your worst pain, or your average pain. If you are asked for one of these two, then it should be understood that it is over the past 24 hours.  Basic Activities of Daily Living (ADL): Personal hygiene, dressing, eating, transferring, and using restroom.  Instructions: Most patients tend to report their level of pain as a combination of two factors, their physical pain and their psychosocial pain. This last one is also known as "suffering" and it is reflection of how physical pain affects you socially and psychologically. From now on, report them separately. From this point on, when asked to report your pain level, report only your physical pain. Use the following table for reference.  Pain Clinic Pain Levels (0-5/10)  Pain Level Score  Description  No Pain 0   Mild pain 1 Nagging, annoying, but does not interfere with basic activities of daily living (ADL). Patients are able to eat, bathe, get dressed, toileting (being able to get on and off the toilet and perform personal hygiene functions), transfer (move in and out of bed or a chair without assistance), and maintain continence (able to control bladder and bowel functions). Blood pressure and heart rate are unaffected. A normal heart rate for a healthy adult ranges from 60 to 100 bpm (beats per minute).   Mild to moderate pain 2 Noticeable and distracting. Impossible to hide from other people. More frequent flare-ups. Still possible to adapt and function close to normal. It can be very annoying and may have occasional stronger flare-ups. With discipline, patients may get used to it and adapt.   Moderate pain 3 Interferes significantly  with activities of daily living (ADL). It becomes difficult to feed, bathe, get dressed, get on and off the toilet or to perform personal hygiene functions. Difficult to get in and out of bed or a chair without assistance. Very distracting. With effort, it can be ignored when deeply involved in activities.   Moderately severe pain 4 Impossible to ignore for more than a few minutes. With effort, patients may still be able to manage work or participate in some social activities. Very difficult to concentrate. Signs of autonomic nervous system discharge are evident: dilated pupils (mydriasis); mild sweating (diaphoresis); sleep interference. Heart rate becomes elevated (>115 bpm). Diastolic blood pressure (lower number) rises above 100 mmHg. Patients find relief in laying down and not moving.   Severe pain 5 Intense and extremely unpleasant. Associated with frowning face and frequent crying. Pain overwhelms the senses.  Ability to do any activity or maintain social relationships becomes significantly limited. Conversation becomes difficult. Pacing back and forth is common, as getting into a comfortable position is nearly impossible. Pain wakes you up from deep sleep. Physical signs will be obvious: pupillary dilation; increased sweating; goosebumps; brisk reflexes; cold, clammy hands and feet; nausea, vomiting or dry heaves; loss of appetite; significant sleep disturbance with inability to fall asleep or to remain asleep. When persistent, significant weight loss is observed due to the complete loss of appetite and sleep deprivation.  Blood pressure and heart rate becomes significantly elevated.  Caution: If elevated blood pressure triggers a pounding headache associated with blurred vision, then the patient should immediately seek attention at an urgent or emergency care unit, as these may be signs of an impending stroke.    Emergency Department Pain Levels (6-10/10)  Emergency Room Pain 6 Severely limiting.  Requires emergency care and should not be seen or managed at an outpatient pain management facility. Communication becomes difficult and requires great effort. Assistance to reach the emergency department may be required. Facial flushing and profuse sweating along with potentially dangerous increases in heart rate and blood pressure will be evident.   Distressing pain 7 Self-care is very difficult. Assistance is required to transport, or use restroom. Assistance to reach the emergency department will be required. Tasks requiring coordination, such as bathing and getting dressed become very difficult.   Disabling pain 8 Self-care is no longer possible. At this level, pain is disabling. The individual is unable to do even the most "basic" activities such as walking, eating, bathing, dressing, transferring to a bed, or toileting. Fine motor skills are lost. It is difficult to think clearly.   Incapacitating pain 9 Pain becomes incapacitating. Thought processing is no longer possible. Difficult to remember your own name. Control of movement and coordination are lost.   The worst pain imaginable 10 At this level, most patients pass out from pain. When this level is reached, collapse of the autonomic nervous system occurs, leading to a sudden drop in blood pressure and heart rate. This in turn results in a temporary and dramatic drop in blood flow to the brain, leading to a loss of consciousness. Fainting is one of the body's self defense mechanisms. Passing out puts the brain in a calmed state and causes it to shut down for a while, in order to begin the healing process.    Summary: 1. Refer to this scale when providing [place] with your pain level. 2. Be accurate and careful when reporting your pain level. This will help with your care. 3. Over-reporting your pain level will lead to loss of credibility. 4. Even a level of 1/10 means that there is pain and will be treated at our facility. 5. High, inaccurate  reporting will be documented as "Symptom Exaggeration", leading to loss of credibility and suspicions of possible secondary gains such as obtaining more narcotics, or wanting to appear disabled, for fraudulent reasons. 6. Only pain levels of 5 or below will be seen at our facility. 7. Pain levels of 6 and above will be sent to the Emergency Department and the appointment cancelled. ____________________________________________________________________________________________    BMI Assessment: Estimated body mass index is 45.66 kg/m as calculated from the following:   Height as of this encounter: 5\' 4"  (1.626 m).   Weight as of this encounter: 266 lb (120.7 kg).  BMI interpretation table: BMI level Category Range association with higher incidence of chronic pain  <18 kg/m2 Underweight   18.5-24.9 kg/m2 Ideal body weight   25-29.9 kg/m2 Overweight Increased incidence by 20%  30-34.9 kg/m2 Obese (Class I) Increased incidence by 68%  35-39.9 kg/m2 Severe obesity (Class II) Increased incidence by 136%  >40 kg/m2 Extreme obesity (Class III) Increased incidence by 254%   Patient's current BMI Ideal Body weight  Body mass index is 45.66 kg/m. Ideal body weight: 54.7 kg (120 lb 9.5 oz) Adjusted ideal body weight: 81.1 kg (178 lb 12.1 oz)   BMI Readings from Last 4 Encounters:  07/24/17 45.66 kg/m  04/28/17 47.89 kg/m  03/06/17  47.20 kg/m  03/06/17 47.20 kg/m   Wt Readings from Last 4 Encounters:  07/24/17 266 lb (120.7 kg)  04/28/17 279 lb (126.6 kg)  03/06/17 275 lb (124.7 kg)  03/06/17 275 lb (124.7 kg)

## 2017-07-24 NOTE — Progress Notes (Signed)
Patient's Name: Mary Cruz  MRN: [id number]  Referring Provider: Emeterio Reeve, DO  DOB: 07/14/1975  PCP: Emeterio Reeve, DO  DOS: 07/24/2017  Note by: Vevelyn Francois NP  Service setting: Ambulatory outpatient  Specialty: Interventional Pain Management  Location: [facility] (AMB) Pain Management Facility    Patient type: Established    Primary Reason(s) for Visit: Encounter for prescription drug management. (Level of risk: moderate)  CC: Medication Refill (Oxycodone )  HPI  Mary Cruz is a 42 y.o. year old, female patient, who comes today for a medication management evaluation. She has GERD; Irritable bowel syndrome; KIDNEY STONES; Essential hypertension, benign; Chronic pain syndrome; Generalized anxiety disorder; Obesity; Long term current use of opiate analgesic; Long term prescription opiate use; Opiate use (30 MME/Day); Opiate dependence ([place]); Encounter for therapeutic drug level monitoring; Musculoskeletal pain; Muscle cramping; Osteoarthrosis; Vitamin D insufficiency; Presence of functional implant ([employer] Lumbar spinal cord stimulator implant); Encounter for management of implanted device; Chronic low back pain (Bilateral) (R>L); Chronic lumbar radicular pain (Right L5 dermatome; Left S1 Dermatome) (Bilateral) (R>L); Chronic hip pain (Location of Tertiary source of pain) (Bilateral) (R>L); Chronic neck pain (Location of Primary Source of Pain) (Bilateral) (L>R); Chronic upper extremity pain (Location of Secondary source of pain) (Bilateral) (L>R); Morbid obesity ([place]); Nicotine dependence; Chronic cervical radicular pain (C7 Dermatome) (Bilateral) (L>R); Chronic lower extremity pain (Bilateral) (R>L); Right knee pain; Anxiety and depression; Implant pain at battery site (right buttocks area); Lumbar spondylosis; Lumbar facet syndrome (Bilateral) (R>L); Positive ANA (antinuclear antibody); Spinal cord stimulator status; Battery end of life of spinal cord stimulator; Neurogenic pain;  Fibromyalgia; Family history of thyroid disease; and Pain in right knee on their problem list. Her primarily concern today is the Medication Refill (Oxycodone )  Pain Assessment: Location: Lower Back Radiating: Radiates down legs bilateral down to feet. Right leg is worse.  Onset: More than a month ago Duration: Chronic pain Quality: Constant, Stabbing, Sharp, Shooting Severity: 6 /10 (subjective, self-reported pain score)  Note: Reported level is compatible with observation. Clinically the patient looks like a 2/10 A 2/10 is viewed as "Mild to Moderate" and described as noticeable and distracting. Impossible to hide from other people. More frequent flare-ups. Still possible to adapt and function close to normal. It can be very annoying and may have occasional stronger flare-ups. With discipline, patients may get used to it and adapt. Information on the proper use of the pain scale provided to the patient today. When using our objective Pain Scale, levels between 6 and 10/10 are said to belong in an emergency room, as it progressively worsens from a 6/10, described as severely limiting, requiring emergency care not usually available at an outpatient pain management facility. At a 6/10 level, communication becomes difficult and requires great effort. Assistance to reach the emergency department may be required. Facial flushing and profuse sweating along with potentially dangerous increases in heart rate and blood pressure will be evident. Effect on ADL: Household chorse and driving  Timing: Constant Modifying factors: Medications, walking, and laying flat BP: (!) 139/92  HR: 100  Mary Cruz was last scheduled for an appointment on 07/04/2017 for medication management. During today's appointment we reviewed Mary Cruz chronic pain status, as well as her outpatient medication regimen. She admits that she is ready for another procedure that will give her long lasting pain relief.    The patient   reports that she does not use drugs. Her body mass index is 45.66 kg/m.  Further details on  both, my assessment(s), as well as the proposed treatment plan, please see below.  Controlled Substance Pharmacotherapy Assessment REMS (Risk Evaluation and Mitigation Strategy)  Analgesic:Oxycodone IR 5 mg every 6 hours when necessary for pain. (20 mg/day) MME/day:30 mg/day.   Janne Napoleon, RN  07/24/2017 11:08 AM  Sign at close encounter  Safety precautions to be maintained throughout the outpatient stay will include: orient to surroundings, keep bed in low position, maintain call bell within reach at all times, provide assistance with transfer out of bed and ambulation.   Nursing Pain Medication Assessment:  Safety precautions to be maintained throughout the outpatient stay will include: orient to surroundings, keep bed in low position, maintain call bell within reach at all times, provide assistance with transfer out of bed and ambulation.  Medication Inspection Compliance: Pill count conducted under aseptic conditions, in front of the patient. Neither the pills nor the bottle was removed from the patient's sight at any time. Once count was completed pills were immediately returned to the patient in their original bottle.  Medication: Oxycodone HCL Pill/Patch Count: 43 of 120 pills remain Pill/Patch Appearance: Markings consistent with prescribed medication Bottle Appearance: Standard pharmacy container. Clearly labeled. Filled Date: 05 / 23 / 2018 Last Medication intake:  Today   Pharmacokinetics: Liberation and absorption (onset of action): WNL Distribution (time to peak effect): WNL Metabolism and excretion (duration of action): WNL         Pharmacodynamics: Desired effects: Analgesia: Mary Cruz reports >50% benefit. Functional ability: Patient reports that medication allows her to accomplish basic ADLs Clinically meaningful improvement in function (CMIF): Sustained CMIF goals  met Perceived effectiveness: Described as relatively effective, allowing for increase in activities of daily living (ADL) Undesirable effects: Side-effects or Adverse reactions: None reported Monitoring: [place] PMP: Online review of the past 29-monthperiod conducted. Compliant with practice rules and regulations Last UDS on record: Summary  Date Value Ref Range Status  04/28/2017 FINAL  Final    Comment:    ==================================================================== TOXASSURE SELECT 13 (MW) ==================================================================== Test                             Result       Flag       Units Drug Present and Declared for Prescription Verification   Oxycodone                      3179         EXPECTED   ng/mg creat   Oxymorphone                    960          EXPECTED   ng/mg creat   Noroxycodone                   3191         EXPECTED   ng/mg creat   Noroxymorphone                 287          EXPECTED   ng/mg creat    Sources of oxycodone are scheduled prescription medications.    Oxymorphone, noroxycodone, and noroxymorphone are expected    metabolites of oxycodone. Oxymorphone is also available as a    scheduled prescription medication. ==================================================================== Test  Result    Flag   Units      Ref Range   Creatinine              172              mg/dL      >=20 ==================================================================== Declared Medications:  The flagging and interpretation on this report are based on the  following declared medications.  Unexpected results may arise from  inaccuracies in the declared medications.  **Note: The testing scope of this panel includes these medications:  Oxycodone  **Note: The testing scope of this panel does not include following  reported medications:  Albuterol  Amitriptyline  Duloxetine  Famotidine  Ibuprofen  Methocarbamol   Omeprazole  Pregabalin  Vitamin D3 ==================================================================== For clinical consultation, please call [phone]. ====================================================================    UDS interpretation: Compliant          Medication Assessment Form: Reviewed. Patient indicates being compliant with therapy Treatment compliance: Compliant Risk Assessment Profile: Aberrant behavior: See prior evaluations. None observed or detected today Comorbid factors increasing risk of overdose: See prior notes. No additional risks detected today Risk of substance use disorder (SUD): Low Opioid Risk Tool - 07/24/17 1106      Family History of Substance Abuse   Alcohol  Negative    Illegal Drugs  Negative    Rx Drugs  Negative      Personal History of Substance Abuse   Alcohol  Negative    Illegal Drugs  Negative    Rx Drugs  Negative      Age   Age between 29-45 years   Yes      History of Preadolescent Sexual Abuse   History of Preadolescent Sexual Abuse  Negative or Female      Psychological Disease   Psychological Disease  Negative    Depression  Negative      Total Score   Opioid Risk Tool Scoring  1    Opioid Risk Interpretation  Low Risk      ORT Scoring interpretation table:  Score <3 = Low Risk for SUD  Score between 4-7 = Moderate Risk for SUD  Score >8 = High Risk for Opioid Abuse   Risk Mitigation Strategies:  Patient Counseling: Covered Patient-Prescriber Agreement (PPA): Present and active  Notification to other healthcare providers: Done  Pharmacologic Plan: No change in therapy, at this time.             Laboratory Chemistry  Inflammation Markers (CRP: Acute Phase) (ESR: Chronic Phase) Lab Results  Component Value Date   CRP 1.0 (H) 04/05/2015   ESRSEDRATE 35 (H) 04/05/2015   LATICACIDVEN 1.0 10/10/2013                         Rheumatology Markers Lab Results  Component Value Date   ANA POS (A) 09/11/2015                         Renal Function Markers Lab Results  Component Value Date   BUN 16 10/24/2016   CREATININE 1.01 57/84/6962   BCR NOT APPLICABLE 95/28/4132   GFRAA 81 10/24/2016   GFRNONAA 70 10/24/2016                             Hepatic Function Markers Lab Results  Component Value Date   AST 18 10/24/2016   ALT  16 10/24/2016   ALBUMIN 3.8 07/22/2016   ALKPHOS 96 07/22/2016                        Electrolytes Lab Results  Component Value Date   NA 138 10/24/2016   K 4.0 10/24/2016   CL 103 10/24/2016   CALCIUM 9.4 10/24/2016   MG 2.0 04/05/2015                        Neuropathy Markers No results found for: VITAMINB12, FOLATE, HGBA1C, HIV                      Bone Pathology Markers Lab Results  Component Value Date   VD25OH 25 (L) 10/24/2016                         Coagulation Parameters Lab Results  Component Value Date   INR 1.05 06/21/2016   LABPROT 13.8 06/21/2016   APTT 29 06/21/2016   PLT 285 10/24/2016                        Cardiovascular Markers Lab Results  Component Value Date   HGB 11.8 10/24/2016   HCT 35.6 10/24/2016                         CA Markers No results found for: CEA, CA125, LABCA2                      Note: Lab results reviewed.  Recent Diagnostic Imaging Results  MR LUMBAR SPINE WO CONTRAST CLINICAL DATA:  Low back and bilateral hip pain with numbness and tingling in both legs for 1 year.  EXAM: MRI LUMBAR SPINE WITHOUT CONTRAST  TECHNIQUE: Multiplanar, multisequence MR imaging of the lumbar spine was performed. No intravenous contrast was administered.  COMPARISON:  CT lumbar spine 07/09/2012. Plain films lumbar spine 10/02/2016  FINDINGS: Segmentation:  Standard.  Alignment:  Normal.  Vertebrae: Height and signal are normal. No pars interarticularis defect.  Conus medullaris and cauda equina: Conus extends to the T12 level. Conus and cauda equina appear normal.  Paraspinal and other soft  tissues: Spinal stimulator extending cephalad to T12 noted.  Disc levels:  Disc height and hydration are maintained at all levels. The central canal and foramina are widely patent at all levels. Mild to moderate bilateral facet degenerative change L5-S1 is noted.  IMPRESSION: Mild-to-moderate bilateral facet degenerative disease at L5-S1. The lumbar spine otherwise appears normal. The central canal and foramina are widely patent all levels.  Electronically Signed   By: Inge Rise M.D.   On: 04/07/2017 15:28  Complexity Note: Imaging results reviewed. Results shared with Mary Cruz, using Layman's terms.                         Meds   Current Outpatient Medications:  .  albuterol (PROVENTIL HFA;VENTOLIN HFA) 108 (90 Base) MCG/ACT inhaler, Inhale 1-2 puffs into the lungs every 6 (six) hours as needed for wheezing or shortness of breath., Disp: 1 Inhaler, Rfl: 0 .  amitriptyline (ELAVIL) 100 MG tablet, TAKE 1 TABLET (100 MG TOTAL) BY MOUTH AT BEDTIME., Disp: 90 tablet, Rfl: 2 .  Cholecalciferol (VITAMIN D3) 2000 units TABS, Take 1 tablet by mouth daily., Disp: , Rfl:  .  DULoxetine (CYMBALTA) 60 MG capsule, Take 1 capsule (60 mg total) by mouth 2 (two) times daily., Disp: 180 capsule, Rfl: 3 .  Ibuprofen-Famotidine 800-26.6 MG TABS, Take by mouth 3 (three) times daily., Disp: , Rfl:  .  omeprazole (PRILOSEC) 40 MG capsule, Take 1 capsule (40 mg total) by mouth daily., Disp: 90 capsule, Rfl: 3 .  [START ON 10/01/2017] oxyCODONE (OXY IR/ROXICODONE) 5 MG immediate release tablet, Take 1 tablet (5 mg total) by mouth every 4 (four) hours as needed for severe pain., Disp: 120 tablet, Rfl: 0 .  pregabalin (LYRICA) 150 MG capsule, Take 1 capsule (150 mg total) by mouth every 8 (eight) hours., Disp: 90 capsule, Rfl: 1 .  methocarbamol (ROBAXIN) 750 MG tablet, Take 1 tablet (750 mg total) by mouth every 8 (eight) hours as needed for muscle spasms., Disp: 90 tablet, Rfl: 0 .  [START ON  10/31/2017] oxyCODONE (OXY IR/ROXICODONE) 5 MG immediate release tablet, Take 1 tablet (5 mg total) by mouth every 6 (six) hours as needed for severe pain., Disp: 120 tablet, Rfl: 0 .  [START ON 09/01/2017] oxyCODONE (OXY IR/ROXICODONE) 5 MG immediate release tablet, Take 1 tablet (5 mg total) by mouth every 6 (six) hours as needed for severe pain., Disp: 120 tablet, Rfl: 0  ROS  Constitutional: Denies any fever or chills Gastrointestinal: No reported hemesis, hematochezia, vomiting, or acute GI distress Musculoskeletal: Denies any acute onset joint swelling, redness, loss of ROM, or weakness Neurological: No reported episodes of acute onset apraxia, aphasia, dysarthria, agnosia, amnesia, paralysis, loss of coordination, or loss of consciousness  Allergies  Mary Cruz is allergic to keflex [cephalexin]; amoxicillin; celebrex [celecoxib]; pentazocine lactate; bactrim [sulfamethoxazole-trimethoprim]; doxycycline; erythromycin; hydromorphone hcl; latex; and nabumetone.  [place]  Drug: Mary Cruz  reports that she does not use drugs. Alcohol:  reports that she does not drink alcohol. Tobacco:  reports that she has been smoking cigarettes.  She has never used smokeless tobacco. Medical:  has a past medical history of Abscess of axillary fold (10/10/2013), Acute bronchitis with asthma with acute exacerbation (01/23/2015), Fibromyalgia, Hypertension, Lupus ([place]), Osteoarthritis, Pain, and Sepsis affecting skin (10/10/2013). Surgical: Mary Cruz  has a past surgical history that includes spinal cord stimulator; Occipital nerve stimulator insertion; Cholecystectomy (2000); Knee surgery; Shoulder surgery; Tubal ligation (2000); Endometrial ablation; Spinal cord stimulator implant (2008); Knee surgery (Right, 02-2015); Breast surgery; and Spinal cord stimulator insertion (N/A, 06/21/2016). Family: family history includes Asthma in her mother; Depression in her mother; Fibromyalgia in her unknown relative; Stroke in  her unknown relative; Thyroid disease in her unknown relative.  Constitutional Exam  General appearance: Well nourished, well developed, and well hydrated. In no apparent acute distress Vitals:   07/24/17 1055  BP: (!) 139/92  Pulse: 100  Resp: 16  Temp: 98.3 F (36.8 C)  SpO2: 99%  Weight: 266 lb (120.7 kg)  Height: 5' 4"  (1.626 m)  Psych/Mental status: Alert, oriented x 3 (person, place, & time)       Eyes: PERLA Respiratory: No evidence of acute respiratory distress  Lumbar Spine Area Exam  Skin & Axial Inspection: No masses, redness, or swelling Alignment: Symmetrical Functional ROM: Unrestricted ROM       Stability: No instability detected Muscle Tone/Strength: Functionally intact. No obvious neuro-muscular anomalies detected. Sensory (Neurological): Unimpaired Palpation: Tender       Provocative Tests: Lumbar Hyperextension/rotation test: (+) bilaterally for facet joint pain. Lumbar quadrant test (Kemp's test): deferred today       Lumbar Lateral bending  test: deferred today       Patrick's Maneuver: deferred today                   FABER test: deferred today       Thigh-thrust test: deferred today       S-I compression test: deferred today       S-I distraction test: deferred today        Gait & Posture Assessment  Ambulation: Unassisted Gait: Relatively normal for age and body habitus Posture: WNL   Lower Extremity Exam    Side: Right lower extremity  Side: Left lower extremity  Stability: No instability observed          Stability: No instability observed          Skin & Extremity Inspection: Skin color, temperature, and hair growth are WNL. No peripheral edema or cyanosis. No masses, redness, swelling, asymmetry, or associated skin lesions. No contractures.  Skin & Extremity Inspection: Skin color, temperature, and hair growth are WNL. No peripheral edema or cyanosis. No masses, redness, swelling, asymmetry, or associated skin lesions. No contractures.   Functional ROM: Unrestricted ROM                  Functional ROM: Unrestricted ROM                  Muscle Tone/Strength: Functionally intact. No obvious neuro-muscular anomalies detected.  Muscle Tone/Strength: Functionally intact. No obvious neuro-muscular anomalies detected.  Sensory (Neurological): Unimpaired  Sensory (Neurological): Unimpaired  Palpation: No palpable anomalies  Palpation: No palpable anomalies   Assessment  Primary Diagnosis & Pertinent Problem List: The primary encounter diagnosis was Lumbar spondylosis. Diagnoses of Lumbar facet syndrome (Bilateral) (R>L), Fibromyalgia, Anxiety and depression, Neurogenic pain, and Chronic pain syndrome were also pertinent to this visit.  Status Diagnosis  Worsening Worsening Controlled 1. Lumbar spondylosis   2. Lumbar facet syndrome (Bilateral) (R>L)   3. Fibromyalgia   4. Anxiety and depression   5. Neurogenic pain   6. Chronic pain syndrome     Problems updated and reviewed during this visit: No problems updated. Plan of Care  Pharmacotherapy (Medications Ordered): Meds ordered this encounter  Medications  . pregabalin (LYRICA) 150 MG capsule    Sig: Take 1 capsule (150 mg total) by mouth every 8 (eight) hours.    Dispense:  90 capsule    Refill:  1    Do not place medication on "Automatic Refill". Fill one day early if pharmacy is closed on scheduled refill date.    Order Specific Question:   Supervising Provider    Answer:   Milinda Pointer [phone]  . oxyCODONE (OXY IR/ROXICODONE) 5 MG immediate release tablet    Sig: Take 1 tablet (5 mg total) by mouth every 6 (six) hours as needed for severe pain.    Dispense:  120 tablet    Refill:  0    Do not add this medication to the electronic "Automatic Refill" notification system. Patient may have prescription filled one day early if pharmacy is closed on scheduled refill date. Do not fill until: 10/31/2017 To last until: 11/30/2017    Order Specific Question:    Supervising Provider    Answer:   Milinda Pointer [phone]  . oxyCODONE (OXY IR/ROXICODONE) 5 MG immediate release tablet    Sig: Take 1 tablet (5 mg total) by mouth every 4 (four) hours as needed for severe pain.    Dispense:  120 tablet  Refill:  0    Patient may have prescription filled one day early if pharmacy is closed on scheduled refill date. Do not fill until: 10/01/2017 To last until:10/31/2017    Order Specific Question:   Supervising Provider    Answer:   Milinda Pointer [phone]  . oxyCODONE (OXY IR/ROXICODONE) 5 MG immediate release tablet    Sig: Take 1 tablet (5 mg total) by mouth every 6 (six) hours as needed for severe pain.    Dispense:  120 tablet    Refill:  0    Do not add this medication to the electronic "Automatic Refill" notification system. Patient may have prescription filled one day early if pharmacy is closed on scheduled refill date. Do not fill until: 09/01/2017 To last until: 10/01/2017    Order Specific Question:   Supervising Provider    Answer:   Milinda Pointer [phone]  . methocarbamol (ROBAXIN) 750 MG tablet    Sig: Take 1 tablet (750 mg total) by mouth every 8 (eight) hours as needed for muscle spasms.    Dispense:  90 tablet    Refill:  0    Do not place this medication, or any other prescription from our practice, on "Automatic Refill". Patient may have prescription filled one day early if pharmacy is closed on scheduled refill date.    Order Specific Question:   Supervising Provider    Answer:   Milinda Pointer [phone]   New Prescriptions   METHOCARBAMOL (ROBAXIN) 750 MG TABLET    Take 1 tablet (750 mg total) by mouth every 8 (eight) hours as needed for muscle spasms.   Medications administered today: Mary Cruz had no medications administered during this visit. Lab-work, procedure(s), and/or referral(s): Orders Placed This Encounter  Procedures  . Radiofrequency,Lumbar   Imaging and/or  referral(s): None  Interventional therapies: Planned, scheduled, and/or pending:  Palliative Left -sided lumbar facet radiofrequency.    Considering:  Palliative Bilateral lumbar facet radiofrequency.    Palliative PRN treatment(s):  Palliative left-sided C7-T1 cervical epidural steroid injection      Provider-requested follow-up: Return in about 4 months (around 11/13/2017) for MedMgmt with Me Dionisio David), in addition, Procedure(w/Sedation), w/ Dr. Dossie Arbour, Left Lumbar RFA.  Future Appointments  Date Time Provider [place]  11/13/2017 10:30 AM Vevelyn Francois, NP [facility] None   Primary Care Physician: Emeterio Reeve, DO Location: [facility] Outpatient Pain Management Facility Note by: Vevelyn Francois NP Date: 07/24/2017; Time: 3:57 PM  Pain Score Disclaimer: We use the NRS-11 scale. This is a self-reported, subjective measurement of pain severity with only modest accuracy. It is used primarily to identify changes within a particular patient. It must be understood that outpatient pain scales are significantly less accurate that those used for research, where they can be applied under ideal controlled circumstances with minimal exposure to variables. In reality, the score is likely to be a combination of pain intensity and pain affect, where pain affect describes the degree of emotional arousal or changes in action readiness caused by the sensory experience of pain. Factors such as social and work situation, setting, emotional state, anxiety levels, expectation, and prior pain experience may influence pain perception and show large inter-individual differences that may also be affected by time variables.  Patient instructions provided during this appointment: Patient Instructions   _You have been given prescription for Oxycodone 12m to last until 11/30/2017 and Lyrica. _Robaxin 759msent to  pharmacy.  ____________________________________________________________________________________________  Preparing for Procedure with Sedation  Instructions: .  Oral Intake: Do not eat or drink anything for at least 8 hours prior to your procedure. . Transportation: Public transportation is not allowed. Bring an adult driver. The driver must be physically present in our waiting room before any procedure can be started. [name] Physical Assistance: Bring an adult physically capable of assisting you, in the event you need help. This adult should keep you company at home for at least 6 hours after the procedure. . Blood Pressure Medicine: Take your blood pressure medicine with a sip of water the morning of the procedure. . Blood thinners:  . Diabetics on insulin: Notify the staff so that you can be scheduled 1st case in the morning. If your diabetes requires high dose insulin, take only  of your normal insulin dose the morning of the procedure and notify the staff that you have done so. . Preventing infections: Shower with an antibacterial soap the morning of your procedure. . Build-up your immune system: Take 1000 mg of Vitamin C with every meal (3 times a day) the day prior to your procedure. [name] Antibiotics: Inform the staff if you have a condition or reason that requires you to take antibiotics before dental procedures. . Pregnancy: If you are pregnant, call and cancel the procedure. . Sickness: If you have a cold, fever, or any active infections, call and cancel the procedure. . Arrival: You must be in the facility at least 30 minutes prior to your scheduled procedure. . Children: Do not bring children with you. . Dress appropriately: Bring dark clothing that you would not mind if they get stained. . Valuables: Do not bring any jewelry or valuables.  Procedure appointments are reserved for interventional treatments only. [name] No Prescription Refills. . No medication changes will be discussed during  procedure appointments. . No disability issues will be discussed.  Remember:  Regular Business hours are:  Monday to Thursday 8:00 AM to 4:00 PM  Provider's Schedule: Milinda Pointer, MD:  Procedure days: Tuesday and Thursday 7:30 AM to 4:00 PM  Gillis Santa, MD:  Procedure days: Monday and Wednesday 7:30 AM to 4:00 PM ____________________________________________________________________________________________     Radiofrequency Lesioning Radiofrequency lesioning is a procedure that is performed to relieve pain. The procedure is often used for back, neck, or arm pain. Radiofrequency lesioning involves the use of a machine that creates radio waves to make heat. During the procedure, the heat is applied to the nerve that carries the pain signal. The heat damages the nerve and interferes with the pain signal. Pain relief usually starts about 2 weeks after the procedure and lasts for 6 months to 1 year. Tell a health care provider about:  Any allergies you have.  All medicines you are taking, including vitamins, herbs, eye drops, creams, and over-the-counter medicines.  Any problems you or family members have had with anesthetic medicines.  Any blood disorders you have.  Any surgeries you have had.  Any medical conditions you have.  Whether you are pregnant or may be pregnant. What are the risks? Generally, this is a safe procedure. However, problems may occur, including:  Pain or soreness at the injection site.  Infection at the injection site.  Damage to nerves or blood vessels.  What happens before the procedure?  Ask your health care provider about: ? Changing or stopping your regular medicines. This is especially important if you are taking diabetes medicines or blood thinners. ? Taking medicines such as aspirin and ibuprofen. These medicines can thin your blood. Do not take  these medicines before your procedure if your health care provider instructs you not  to.  Follow instructions from your health care provider about eating or drinking restrictions.  Plan to have someone take you home after the procedure.  If you go home right after the procedure, plan to have someone with you for 24 hours. What happens during the procedure?  You will be given one or more of the following: ? A medicine to help you relax (sedative). ? A medicine to numb the area (local anesthetic).  You will be awake during the procedure. You will need to be able to talk with the health care provider during the procedure.  With the help of a type of X-ray (fluoroscopy), the health care provider will insert a radiofrequency needle into the area to be treated.  Next, a wire that carries the radio waves (electrode) will be put through the radiofrequency needle. An electrical pulse will be sent through the electrode to verify the correct nerve. You will feel a tingling sensation, and you may have muscle twitching.  Then, the tissue that is around the needle tip will be heated by an electric current that is passed using the radiofrequency machine. This will numb the nerves.  A bandage (dressing) will be put on the insertion area after the procedure is done. The procedure may vary among health care providers and hospitals. What happens after the procedure?  Your blood pressure, heart rate, breathing rate, and blood oxygen level will be monitored often until the medicines you were given have worn off.  Return to your normal activities as directed by your health care provider. This information is not intended to replace advice given to you by your health care provider. Make sure you discuss any questions you have with your health care provider. Document Released: 09/26/2010 Document Revised: 07/06/2015 Document Reviewed: 03/07/2014 Elsevier Interactive Patient Education  2018 [employer].   __________________________________________________________________________________________  Medication Rules  Applies to: All patients receiving prescriptions (written or electronic).  Pharmacy of record: Pharmacy where electronic prescriptions will be sent. If written prescriptions are taken to a different pharmacy, please inform the nursing staff. The pharmacy listed in the electronic medical record should be the one where you would like electronic prescriptions to be sent.  Prescription refills: Only during scheduled appointments. Applies to both, written and electronic prescriptions.  NOTE: The following applies primarily to controlled substances (Opioid* Pain Medications).   Patient's responsibilities: 1. Pain Pills: Bring all pain pills to every appointment (except for procedure appointments). 2. Pill Bottles: Bring pills in original pharmacy bottle. Always bring newest bottle. Bring bottle, even if empty. 3. Medication refills: You are responsible for knowing and keeping track of what medications you need refilled. The day before your appointment, write a list of all prescriptions that need to be refilled. Bring that list to your appointment and give it to the admitting nurse. Prescriptions will be written only during appointments. If you forget a medication, it will not be "Called in", "Faxed", or "electronically sent". You will need to get another appointment to get these prescribed. 4. Prescription Accuracy: You are responsible for carefully inspecting your prescriptions before leaving our office. Have the discharge nurse carefully go over each prescription with you, before taking them home. Make sure that your name is accurately spelled, that your address is correct. Check the name and dose of your medication to make sure it is accurate. Check the number of pills, and the written instructions to make sure they are  clear and accurate. Make sure that you are given enough medication to last  until your next medication refill appointment. 5. Taking Medication: Take medication as prescribed. Never take more pills than instructed. Never take medication more frequently than prescribed. Taking less pills or less frequently is permitted and encouraged, when it comes to controlled substances (written prescriptions).  6. Inform other Doctors: Always inform, all of your healthcare providers, of all the medications you take. 7. Pain Medication from other Providers: You are not allowed to accept any additional pain medication from any other Doctor or Healthcare provider. There are two exceptions to this rule. (see below) In the event that you require additional pain medication, you are responsible for notifying us, as stated below. 8. Medication Agreement: You are responsible for carefully reading and following our Medication Agreement. This must be signed before receiving any prescriptions from our practice. Safely store a copy of your signed Agreement. Violations to the Agreement will result in no further prescriptions. (Additional copies of our Medication Agreement are available upon request.) 9. Laws, Rules, & Regulations: All patients are expected to follow all Federal and [employer], [employer], Rules, [employer] [facility]. Ignorance of the Laws does not constitute a valid excuse. The use of any illegal substances is prohibited. 10. Adopted CDC guidelines & recommendations: Target dosing levels will be at or below 60 MME/day. Use of benzodiazepines** is not recommended.  Exceptions: There are only two exceptions to the rule of not receiving pain medications from other Healthcare Providers. 1. Exception #1 (Emergencies): In the event of an emergency (i.e.: accident requiring emergency care), you are allowed to receive additional pain medication. However, you are responsible for: As soon as you are able, call our office [phone], at any time of the day or night, and leave a message stating your name, the  date and nature of the emergency, and the name and dose of the medication prescribed. In the event that your call is answered by a member of our staff, make sure to document and save the date, time, and the name of the person that took your information.  2. Exception #2 (Planned Surgery): In the event that you are scheduled by another doctor or dentist to have any type of surgery or procedure, you are allowed (for a period no longer than 30 days), to receive additional pain medication, for the acute post-op pain. However, in this case, you are responsible for picking up a copy of our "Post-op Pain Management for Surgeons" handout, and giving it to your surgeon or dentist. This document is available at our office, and does not require an appointment to obtain it. Simply go to our office during business hours (Monday-Thursday from 8:00 AM to 4:00 PM) (Friday 8:00 AM to 12:00 Noon) or if you have a scheduled appointment with [place], prior to your surgery, and ask for it by name. In addition, you will need to provide [place] with your name, name of your surgeon, type of surgery, and date of procedure or surgery.  *Opioid medications include: morphine, codeine, oxycodone, oxymorphone, hydrocodone, hydromorphone, meperidine, tramadol, tapentadol, buprenorphine, fentanyl, methadone. **Benzodiazepine medications include: diazepam (Valium), alprazolam (Xanax), clonazepam (Klonopine), lorazepam (Ativan), clorazepate (Tranxene), chlordiazepoxide (Librium), estazolam (Prosom), oxazepam (Serax), temazepam (Restoril), triazolam (Halcion) (Last updated: 04/10/2017) ____________________________________________________________________________________________   ____________________________________________________________________________________________  Pain Scale  Introduction: The pain score used by this practice is the Verbal Numerical Rating Scale (VNRS-11). This is an 11-point scale. It is for adults and children 10 years or  older. There are  significant differences in how the pain score is reported, used, and applied. Forget everything you learned in the past and learn this scoring system.  General Information: The scale should reflect your current level of pain. Unless you are specifically asked for the level of your worst pain, or your average pain. If you are asked for one of these two, then it should be understood that it is over the past 24 hours.  Basic Activities of Daily Living (ADL): Personal hygiene, dressing, eating, transferring, and using restroom.  Instructions: Most patients tend to report their level of pain as a combination of two factors, their physical pain and their psychosocial pain. This last one is also known as "suffering" and it is reflection of how physical pain affects you socially and psychologically. From now on, report them separately. From this point on, when asked to report your pain level, report only your physical pain. Use the following table for reference.  Pain Clinic Pain Levels (0-5/10)  Pain Level Score  Description  No Pain 0   Mild pain 1 Nagging, annoying, but does not interfere with basic activities of daily living (ADL). Patients are able to eat, bathe, get dressed, toileting (being able to get on and off the toilet and perform personal hygiene functions), transfer (move in and out of bed or a chair without assistance), and maintain continence (able to control bladder and bowel functions). Blood pressure and heart rate are unaffected. A normal heart rate for a healthy adult ranges from 60 to 100 bpm (beats per minute).   Mild to moderate pain 2 Noticeable and distracting. Impossible to hide from other people. More frequent flare-ups. Still possible to adapt and function close to normal. It can be very annoying and may have occasional stronger flare-ups. With discipline, patients may get used to it and adapt.   Moderate pain 3 Interferes significantly with activities of daily  living (ADL). It becomes difficult to feed, bathe, get dressed, get on and off the toilet or to perform personal hygiene functions. Difficult to get in and out of bed or a chair without assistance. Very distracting. With effort, it can be ignored when deeply involved in activities.   Moderately severe pain 4 Impossible to ignore for more than a few minutes. With effort, patients may still be able to manage work or participate in some social activities. Very difficult to concentrate. Signs of autonomic nervous system discharge are evident: dilated pupils (mydriasis); mild sweating (diaphoresis); sleep interference. Heart rate becomes elevated (>115 bpm). Diastolic blood pressure (lower number) rises above 100 mmHg. Patients find relief in laying down and not moving.   Severe pain 5 Intense and extremely unpleasant. Associated with frowning face and frequent crying. Pain overwhelms the senses.  Ability to do any activity or maintain social relationships becomes significantly limited. Conversation becomes difficult. Pacing back and forth is common, as getting into a comfortable position is nearly impossible. Pain wakes you up from deep sleep. Physical signs will be obvious: pupillary dilation; increased sweating; goosebumps; brisk reflexes; cold, clammy hands and feet; nausea, vomiting or dry heaves; loss of appetite; significant sleep disturbance with inability to fall asleep or to remain asleep. When persistent, significant weight loss is observed due to the complete loss of appetite and sleep deprivation.  Blood pressure and heart rate becomes significantly elevated. Caution: If elevated blood pressure triggers a pounding headache associated with blurred vision, then the patient should immediately seek attention at an urgent or emergency care unit, as these may  be signs of an impending stroke.    Emergency Department Pain Levels (6-10/10)  Emergency Room Pain 6 Severely limiting. Requires emergency care  and should not be seen or managed at an outpatient pain management facility. Communication becomes difficult and requires great effort. Assistance to reach the emergency department may be required. Facial flushing and profuse sweating along with potentially dangerous increases in heart rate and blood pressure will be evident.   Distressing pain 7 Self-care is very difficult. Assistance is required to transport, or use restroom. Assistance to reach the emergency department will be required. Tasks requiring coordination, such as bathing and getting dressed become very difficult.   Disabling pain 8 Self-care is no longer possible. At this level, pain is disabling. The individual is unable to do even the most "basic" activities such as walking, eating, bathing, dressing, transferring to a bed, or toileting. Fine motor skills are lost. It is difficult to think clearly.   Incapacitating pain 9 Pain becomes incapacitating. Thought processing is no longer possible. Difficult to remember your own name. Control of movement and coordination are lost.   The worst pain imaginable 10 At this level, most patients pass out from pain. When this level is reached, collapse of the autonomic nervous system occurs, leading to a sudden drop in blood pressure and heart rate. This in turn results in a temporary and dramatic drop in blood flow to the brain, leading to a loss of consciousness. Fainting is one of the body's self defense mechanisms. Passing out puts the brain in a calmed state and causes it to shut down for a while, in order to begin the healing process.    Summary: 1. Refer to this scale when providing [place] with your pain level. 2. Be accurate and careful when reporting your pain level. This will help with your care. 3. Over-reporting your pain level will lead to loss of credibility. 4. Even a level of 1/10 means that there is pain and will be treated at our facility. 5. High, inaccurate reporting will be  documented as "Symptom Exaggeration", leading to loss of credibility and suspicions of possible secondary gains such as obtaining more narcotics, or wanting to appear disabled, for fraudulent reasons. 6. Only pain levels of 5 or below will be seen at our facility. 7. Pain levels of 6 and above will be sent to the Emergency Department and the appointment cancelled. ____________________________________________________________________________________________    BMI Assessment: Estimated body mass index is 45.66 kg/m as calculated from the following:   Height as of this encounter: 5' 4"  (1.626 m).   Weight as of this encounter: 266 lb (120.7 kg).  BMI interpretation table: BMI level Category Range association with higher incidence of chronic pain  <18 kg/m2 Underweight   18.5-24.9 kg/m2 Ideal body weight   25-29.9 kg/m2 Overweight Increased incidence by 20%  30-34.9 kg/m2 Obese (Class I) Increased incidence by 68%  35-39.9 kg/m2 Severe obesity (Class II) Increased incidence by 136%  >40 kg/m2 Extreme obesity (Class III) Increased incidence by 254%   Patient's current BMI Ideal Body weight  Body mass index is 45.66 kg/m. Ideal body weight: 54.7 kg (120 lb 9.5 oz) Adjusted ideal body weight: 81.1 kg (178 lb 12.1 oz)   BMI Readings from Last 4 Encounters:  07/24/17 45.66 kg/m  04/28/17 47.89 kg/m  03/06/17 47.20 kg/m  03/06/17 47.20 kg/m   Wt Readings from Last 4 Encounters:  07/24/17 266 lb (120.7 kg)  04/28/17 279 lb (126.6 kg)  03/06/17 275 lb (  124.7 kg)  03/06/17 275 lb (124.7 kg)

## 2017-07-24 NOTE — Progress Notes (Signed)
  Safety precautions to be maintained throughout the outpatient stay will include: orient to surroundings, keep bed in low position, maintain call bell within reach at all times, provide assistance with transfer out of bed and ambulation.   Nursing Pain Medication Assessment:  Safety precautions to be maintained throughout the outpatient stay will include: orient to surroundings, keep bed in low position, maintain call bell within reach at all times, provide assistance with transfer out of bed and ambulation.  Medication Inspection Compliance: Pill count conducted under aseptic conditions, in front of the patient. Neither the pills nor the bottle was removed from the patient's sight at any time. Once count was completed pills were immediately returned to the patient in their original bottle.  Medication: Oxycodone HCL Pill/Patch Count: 43 of 120 pills remain Pill/Patch Appearance: Markings consistent with prescribed medication Bottle Appearance: Standard pharmacy container. Clearly labeled. Filled Date: 05 / 23 / 2018 Last Medication intake:  Today

## 2017-08-27 ENCOUNTER — Other Ambulatory Visit: Payer: Self-pay | Admitting: Nurse Practitioner

## 2017-08-27 ENCOUNTER — Telehealth: Payer: Self-pay | Admitting: *Deleted

## 2017-08-27 MED ORDER — METHOCARBAMOL 750 MG PO TABS: 750.0000 mg | ORAL_TABLET | Freq: Three times a day (TID) | ORAL | 1 refills | Status: DC | PRN

## 2017-08-27 NOTE — Telephone Encounter (Signed)
sent 

## 2017-08-28 NOTE — Telephone Encounter (Signed)
Called patient to let her know that Rx was called in

## 2017-09-01 ENCOUNTER — Ambulatory Visit: Payer: BLUE CROSS/BLUE SHIELD | Admitting: Physician Assistant

## 2017-09-12 ENCOUNTER — Ambulatory Visit: Payer: BLUE CROSS/BLUE SHIELD | Admitting: Physician Assistant

## 2017-09-15 ENCOUNTER — Telehealth: Payer: Self-pay

## 2017-09-15 ENCOUNTER — Ambulatory Visit (INDEPENDENT_AMBULATORY_CARE_PROVIDER_SITE_OTHER): Payer: BLUE CROSS/BLUE SHIELD | Admitting: Osteopathic Medicine

## 2017-09-15 ENCOUNTER — Encounter: Payer: Self-pay | Admitting: Osteopathic Medicine

## 2017-09-15 VITALS — BP 136/84 | HR 102 | Temp 98.0°F | Wt 281.1 lb

## 2017-09-15 DIAGNOSIS — F419 Anxiety disorder, unspecified: Secondary | ICD-10-CM

## 2017-09-15 DIAGNOSIS — F329 Major depressive disorder, single episode, unspecified: Secondary | ICD-10-CM | POA: Diagnosis not present

## 2017-09-15 DIAGNOSIS — F32A Depression, unspecified: Secondary | ICD-10-CM

## 2017-09-15 MED ORDER — CLONAZEPAM 0.5 MG PO TABS: 0.2500 mg | ORAL_TABLET | Freq: Two times a day (BID) | ORAL | 0 refills | Status: DC | PRN

## 2017-09-15 NOTE — Progress Notes (Signed)
HPI: Mary Cruz is a 42 y.o. female who  has a past medical history of Abscess of axillary fold (10/10/2013), Acute bronchitis with asthma with acute exacerbation (01/23/2015), Fibromyalgia, Hypertension, Lupus ([place]), Osteoarthritis, Pain, and Sepsis affecting skin (10/10/2013).  she presents to [facility] today, 09/15/17,  for chief complaint of:  Anxiety GERD  Anxiety: has a lot going on lately. 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 She is currently on Elavil (helping sleep), Cymbalta (helping pain).   Depression screen [facility] 2/9 09/15/2017 07/24/2017 04/28/2017  Decreased Interest 1 0 0  Down, Depressed, Hopeless 2 0 0  PHQ - 2 Score 3 0 0  Altered sleeping 2 - -  Tired, decreased energy 2 - -  Change in appetite 2 - -  Feeling bad or failure about yourself  1 - -  Trouble concentrating 2 - -  Moving slowly or fidgety/restless 2 - -  Suicidal thoughts 0 - -  PHQ-9 Score 14 - -  Difficult doing work/chores Somewhat difficult - -   GAD 7 : Generalized Anxiety Score 09/15/2017  Nervous, Anxious, on Edge 2  Control/stop worrying 2  Worry too much - different things 2  Trouble relaxing 2  Restless 2  Easily annoyed or irritable 2  Afraid - awful might happen 2  Total GAD 7 Score 14  Anxiety Difficulty Somewhat difficult     GERD: requests refill Prilosec.    Past medical history, surgical history, and family history reviewed.  Current medication list and allergy/intolerance information reviewed.   (See remainder of HPI, ROS, Phys Exam below)    ASSESSMENT/PLAN:   Anxiety and depression -situational in nature.  Discussed sparing use of benzodiazepines, consider medication adjustment/augmentation, possibly with assistance from psychiatry, based on how she is doing.   Meds ordered this encounter  Medications  .  clonazePAM (KLONOPIN) 0.5 MG tablet    Sig: Take 0.5-1 tablets (0.25-0.5 mg total) by mouth 2 (two) times daily as needed for anxiety.    Dispense:  30 tablet    Refill:  0    Patient Instructions  For anxiety, sent clonazepam Rx for sparing use when feeling overwhelmed/anxious. NOT for daily use or use every night.   For tetanus shot: we will call you when we have the latex-free vaccine delivered to the office!    Follow-up plan: Return for recheck anxiety as needed nect 6 weeks or so. .                   ############################################ ############################################ ############################################ ############################################    Outpatient Encounter Medications as of 09/15/2017  Medication Sig  . albuterol (PROVENTIL HFA;VENTOLIN HFA) 108 (90 Base) MCG/ACT inhaler Inhale 1-2 puffs into the lungs every 6 (six) hours as needed for wheezing or shortness of breath.  [name] amitriptyline (ELAVIL) 100 MG tablet TAKE 1 TABLET (100 MG TOTAL) BY MOUTH AT BEDTIME.  [name] Cholecalciferol (VITAMIN D3) 2000 units TABS Take 1 tablet by mouth daily.  . DULoxetine (CYMBALTA) 60 MG capsule Take 1 capsule (60 mg total) by mouth 2 (two) times daily.  . Ibuprofen-Famotidine 800-26.6 MG TABS Take by mouth 3 (three) times daily.  . methocarbamol (ROBAXIN) 750 MG tablet Take 1 tablet (750 mg total) by mouth every 8 (eight) hours as needed for muscle spasms.  [name] omeprazole (PRILOSEC) 40 MG capsule Take 1 capsule (40  mg total) by mouth daily.  Derrill Memo ON 10/31/2017] oxyCODONE (OXY IR/ROXICODONE) 5 MG immediate release tablet Take 1 tablet (5 mg total) by mouth every 6 (six) hours as needed for severe pain.  Derrill Memo ON 10/01/2017] oxyCODONE (OXY IR/ROXICODONE) 5 MG immediate release tablet Take 1 tablet (5 mg total) by mouth every 4 (four) hours as needed for severe pain.  [name] oxyCODONE (OXY IR/ROXICODONE) 5 MG immediate release tablet Take 1 tablet  (5 mg total) by mouth every 6 (six) hours as needed for severe pain.  . pregabalin (LYRICA) 150 MG capsule Take 1 capsule (150 mg total) by mouth every 8 (eight) hours.   No facility-administered encounter medications on file as of 09/15/2017.    Allergies  Allergen Reactions  . Keflex [Cephalexin] Other (See Comments)    Nausea and causes uti's  . Amoxicillin Diarrhea and Nausea And Vomiting  . Celebrex [Celecoxib] Swelling    "Body swelling"  . Pentazocine Lactate Itching and Other (See Comments)    HALLUCINATIONS  . Bactrim [Sulfamethoxazole-Trimethoprim] Other (See Comments)    "Makes very sick"  . Doxycycline     UNSPECIFIED REACTION  "SICKNESS"  . Erythromycin Other (See Comments)    RESULTANT UTI  . Hydromorphone Hcl Other (See Comments)    HALLUCINATIONS  . Latex Itching  . Nabumetone Itching      Review of Systems:  Constitutional: No recent illness  HEENT: No  headache, no vision change  Cardiac: No  chest pain, No  pressure, No palpitations  Respiratory:  No  shortness of breath. No  Cough  Gastrointestinal: No  abdominal pain  Neurologic: No  weakness, No  Dizziness  Psychiatric: +concerns with depression, +concerns with anxiety  Exam:  BP 136/84 (BP Location: Left Arm, Patient Position: Sitting, Cuff Size: Large)   Pulse (!) 102   Temp 98 F (36.7 C) (Oral)   Wt 281 lb 1.6 oz (127.5 kg)   BMI 48.25 kg/m   Constitutional: VS see above. General Appearance: alert, well-developed, well-nourished, NAD  Eyes: Normal lids and conjunctive, non-icteric sclera  Ears, Nose, Mouth, Throat: MMM, Normal external inspection ears/nares/mouth/lips/gums.  Neck: No masses, trachea midline.   Respiratory: Normal respiratory effort. no wheeze, no rhonchi, no rales  Cardiovascular: S1/S2 normal, no murmur, no rub/gallop auscultated. RRR.   Musculoskeletal: Gait normal. Symmetric and independent movement of all extremities  Neurological: Normal  balance/coordination. No tremor.  Skin: warm, dry, intact.   Psychiatric: Normal judgment/insight. Normal mood and affect. Oriented x3. Anxious. No thought disorder.   Visit summary with medication list and pertinent instructions was printed for patient to review, advised to alert [place] if any changes needed. All questions at time of visit were answered - patient instructed to contact office with any additional concerns. ER/RTC precautions were reviewed with the patient and understanding verbalized.   Follow-up plan: Return for recheck anxiety as needed nect 6 weeks or so. .  Note: Total time spent 25 minutes, greater than 50% of the visit was spent face-to-face counseling and coordinating care for the following: The encounter diagnosis was Anxiety and depression..  Please note: voice recognition software was used to produce this document, and typos may escape review. Please contact Dr. Sheppard Coil for any needed clarifications.

## 2017-09-15 NOTE — Telephone Encounter (Signed)
I don't have a problem with it, patient and I discussed risks of meds and advised sparing use starting with low dose. I just had the thought though, she should check with her pain management folks if filling the Rx would violater any contract she has with them!

## 2017-09-15 NOTE — Telephone Encounter (Signed)
RX was called in today for Clonazepam. Per  [facility], they need OK from Dr Sheppard Coil to fill RX since pt is getting pain medications from a different doctor.   Note to Dr Sheppard Coil.   Please advise if OK

## 2017-09-15 NOTE — Telephone Encounter (Signed)
Spoke with tech Devina at [facility], gave her ok and she states they are also getting in touch with pt's pain doctor.

## 2017-09-15 NOTE — Patient Instructions (Addendum)
For anxiety, sent clonazepam Rx for sparing use when feeling overwhelmed/anxious. NOT for daily use or use every night.   For tetanus shot: we will call you when we have the latex-free vaccine delivered to the office!

## 2017-09-16 ENCOUNTER — Telehealth: Payer: Self-pay

## 2017-09-16 NOTE — Telephone Encounter (Signed)
Pt called stating that rx for clonazapam and prilosec was never rec'd by pharmacy. Pls [facility] to [facility] [place]. Rx for clonazepam must have written notification indicating med for pain mgmt. Pt is taking prilosec med 1 tab, 2x times daily.

## 2017-09-17 NOTE — Telephone Encounter (Signed)
Apolonio Schneiders, I thought we handled this? I checked phone note from 09/15/17.  Can we call the pharmacy and see where we are at with this?  Thank you!

## 2017-09-17 NOTE — Telephone Encounter (Signed)
Left pt msg advising RXs are at pharmacy and that I called [facility] to confirm this

## 2017-09-17 NOTE — Telephone Encounter (Signed)
Called [facility] again, after a 6 minute hold was able to speak with Devena. She states the Clonazepam is at the pharmacy and ready to be picked up. They did not receive any Prilosec R, which makes sense because chart does not reflect this was seen.   Do you want to RF Prilosec for pt?   Please advise and I will call pt with an update

## 2017-09-17 NOTE — Telephone Encounter (Signed)
Called [facility], on hold without answer for 20 minutes. Hung up, will call back later

## 2017-09-17 NOTE — Telephone Encounter (Signed)
OK she told me she was getting it OTC but can send Rx

## 2017-09-22 ENCOUNTER — Encounter: Payer: Self-pay | Admitting: Student in an Organized Health Care Education/Training Program

## 2017-09-22 ENCOUNTER — Ambulatory Visit (HOSPITAL_BASED_OUTPATIENT_CLINIC_OR_DEPARTMENT_OTHER): Payer: BLUE CROSS/BLUE SHIELD | Admitting: Student in an Organized Health Care Education/Training Program

## 2017-09-22 ENCOUNTER — Ambulatory Visit
Admission: RE | Admit: 2017-09-22 | Discharge: 2017-09-22 | Disposition: A | Discharge status: Home / Self Care | Payer: BLUE CROSS/BLUE SHIELD | Source: Ambulatory Visit | Attending: Student in an Organized Health Care Education/Training Program | Admitting: Student in an Organized Health Care Education/Training Program

## 2017-09-22 ENCOUNTER — Other Ambulatory Visit: Payer: Self-pay

## 2017-09-22 VITALS — BP 121/73 | HR 82 | Temp 98.3°F | Resp 14 | Ht 64.0 in | Wt 260.0 lb

## 2017-09-22 DIAGNOSIS — G8929 Other chronic pain: Secondary | ICD-10-CM | POA: Diagnosis not present

## 2017-09-22 DIAGNOSIS — Z880 Allergy status to penicillin: Secondary | ICD-10-CM | POA: Diagnosis not present

## 2017-09-22 DIAGNOSIS — M545 Low back pain: Secondary | ICD-10-CM | POA: Insufficient documentation

## 2017-09-22 DIAGNOSIS — Z882 Allergy status to sulfonamides status: Secondary | ICD-10-CM | POA: Insufficient documentation

## 2017-09-22 DIAGNOSIS — Z881 Allergy status to other antibiotic agents status: Secondary | ICD-10-CM | POA: Diagnosis not present

## 2017-09-22 DIAGNOSIS — Z886 Allergy status to analgesic agent status: Secondary | ICD-10-CM | POA: Diagnosis not present

## 2017-09-22 DIAGNOSIS — M47816 Spondylosis without myelopathy or radiculopathy, lumbar region: Secondary | ICD-10-CM | POA: Diagnosis not present

## 2017-09-22 MED ORDER — LIDOCAINE HCL 2 % IJ SOLN
INTRAMUSCULAR | Status: AC
  Filled 2017-09-22: qty 20

## 2017-09-22 MED ORDER — DEXAMETHASONE SODIUM PHOSPHATE 10 MG/ML IJ SOLN
10.0000 mg | Freq: Once | INTRAMUSCULAR | Status: AC
  Administered 2017-09-22: 10 mg

## 2017-09-22 MED ORDER — ROPIVACAINE HCL 2 MG/ML IJ SOLN
10.0000 mL | Freq: Once | INTRAMUSCULAR | Status: AC
  Administered 2017-09-22: 10 mL

## 2017-09-22 MED ORDER — LACTATED RINGERS IV SOLN
1000.0000 mL | Freq: Once | INTRAVENOUS | Status: AC
  Administered 2017-09-22: 1000 mL via INTRAVENOUS

## 2017-09-22 MED ORDER — ROPIVACAINE HCL 2 MG/ML IJ SOLN
INTRAMUSCULAR | Status: AC
  Filled 2017-09-22: qty 10

## 2017-09-22 MED ORDER — DEXAMETHASONE SODIUM PHOSPHATE 10 MG/ML IJ SOLN
INTRAMUSCULAR | Status: AC
  Filled 2017-09-22: qty 1

## 2017-09-22 MED ORDER — FENTANYL CITRATE (PF) 100 MCG/2ML IJ SOLN
25.0000 ug | INTRAMUSCULAR | Status: DC | PRN
  Administered 2017-09-22: 100 ug via INTRAVENOUS

## 2017-09-22 MED ORDER — LIDOCAINE HCL 2 % IJ SOLN
20.0000 mL | Freq: Once | INTRAMUSCULAR | Status: AC
  Administered 2017-09-22: 400 mg

## 2017-09-22 MED ORDER — FENTANYL CITRATE (PF) 100 MCG/2ML IJ SOLN
INTRAMUSCULAR | Status: AC
  Filled 2017-09-22: qty 2

## 2017-09-22 NOTE — Progress Notes (Signed)
Safety precautions to be maintained throughout the outpatient stay will include: orient to surroundings, keep bed in low position, maintain call bell within reach at all times, provide assistance with transfer out of bed and ambulation.  

## 2017-09-22 NOTE — Progress Notes (Signed)
Patient's Name: Mary Cruz  MRN: [id number]  Referring Provider: Emeterio Reeve, DO  DOB: Oct 29, 1975  PCP: Emeterio Reeve, DO  DOS: 09/22/2017  Note by: Gillis Santa, MD  Service setting: Ambulatory outpatient  Specialty: Interventional Pain Management  Patient type: Established  Location: [facility] (AMB) Pain Management Facility  Visit type: Interventional Procedure   Primary Reason for Visit: Interventional Pain Management Treatment. CC: Back Pain (low left)  Procedure:          Anesthesia, Analgesia, Anxiolysis:  Type: Thermal Lumbar Facet, Medial Branch Radiofrequency Ablation/Neurotomy Level: L2, L3, L4, L5, & S1 Medial Branch Level(s). These levels will denervate the L3-4, L4-5, and the L5-S1 lumbar facet joints. Primary Purpose: Therapeutic Region: Posterolateral Lumbosacral Spine Laterality: Left  Type: Moderate (Conscious) Sedation combined with Local Anesthesia Indication(s): Analgesia and Anxiety Route: Intravenous (IV) IV Access: Secured Sedation: Meaningful verbal contact was maintained at all times during the procedure  Local Anesthetic: Lidocaine 1-2%   Indications: 1. Lumbar spondylosis    Mary Cruz has been dealing with the above chronic pain for longer than three months and has either failed to respond, was unable to tolerate, or simply did not get enough benefit from other more conservative therapies including, but not limited to: 1. Over-the-counter medications 2. Anti-inflammatory medications 3. Muscle relaxants 4. Membrane stabilizers 5. Opioids 6. Physical therapy 7. Modalities (Heat, ice, etc.) 8. Invasive techniques such as nerve blocks. Mary Cruz has attained more than 50% relief of the pain from a series of diagnostic injections conducted in separate occasions.  Pain Score: Pre-procedure: 5 /10 Post-procedure: 0-No pain/10  Pre-op Assessment:  [name] is a 42 y.o. (year old), female patient, seen today for interventional treatment.  She  has a past surgical history that includes spinal cord stimulator; Occipital nerve stimulator insertion; Cholecystectomy (2000); Knee surgery; Shoulder surgery; Tubal ligation (2000); Endometrial ablation; Spinal cord stimulator implant (2008); Knee surgery (Right, 02-2015); Breast surgery; and Spinal cord stimulator insertion (N/A, 06/21/2016). Mary Cruz has a current medication list which includes the following prescription(s): albuterol, amitriptyline, vitamin d3, clonazepam, diclofenac sodium, duloxetine, methocarbamol, omeprazole, oxycodone, oxycodone, oxycodone, pregabalin, ibuprofen-famotidine, phenazopyridine, and quetiapine, and the following Facility-Administered Medications: fentanyl. Her primarily concern today is the Back Pain (low left)  Initial Vital Signs:  Pulse/HCG Rate: (!) 101ECG Heart Rate: 76 Temp: 98.6 F (37 C) Resp: 18 BP: (!) 122/97 SpO2: 98 %  BMI: Estimated body mass index is 44.63 kg/m as calculated from the following:   Height as of this encounter: 5\' 4"  (1.626 m).   Weight as of this encounter: 260 lb (117.9 kg).  Risk Assessment: Allergies: Reviewed. She is allergic to keflex [cephalexin]; amoxicillin; celebrex [celecoxib]; pentazocine lactate; bactrim [sulfamethoxazole-trimethoprim]; doxycycline; erythromycin; hydromorphone hcl; latex; and nabumetone.  Allergy Precautions: None required Coagulopathies: Reviewed. None identified.  Blood-thinner therapy: None at this time Active Infection(s): Reviewed. None identified. [name] is afebrile  Site Confirmation: Mary Cruz was asked to confirm the procedure and laterality before marking the site Procedure checklist: Completed Consent: Before the procedure and under the influence of no sedative(s), amnesic(s), or anxiolytics, the patient was informed of the treatment options, risks and possible complications. To fulfill our ethical and legal obligations, as recommended by the American Medical Association's  Code of Ethics, I have informed the patient of my clinical impression; the nature and purpose of the treatment or procedure; the risks, benefits, and possible complications of the intervention; the alternatives, including doing nothing; the risk(s) and benefit(s) of the alternative treatment(s) or  procedure(s); and the risk(s) and benefit(s) of doing nothing. The patient was provided information about the general risks and possible complications associated with the procedure. These may include, but are not limited to: failure to achieve desired goals, infection, bleeding, organ or nerve damage, allergic reactions, paralysis, and death. In addition, the patient was informed of those risks and complications associated to Spine-related procedures, such as failure to decrease pain; infection (i.e.: Meningitis, epidural or intraspinal abscess); bleeding (i.e.: epidural hematoma, subarachnoid hemorrhage, or any other type of intraspinal or peri-dural bleeding); organ or nerve damage (i.e.: Any type of peripheral nerve, nerve root, or spinal cord injury) with subsequent damage to sensory, motor, and/or autonomic systems, resulting in permanent pain, numbness, and/or weakness of one or several areas of the body; allergic reactions; (i.e.: anaphylactic reaction); and/or death. Furthermore, the patient was informed of those risks and complications associated with the medications. These include, but are not limited to: allergic reactions (i.e.: anaphylactic or anaphylactoid reaction(s)); adrenal axis suppression; blood sugar elevation that in diabetics may result in ketoacidosis or comma; water retention that in patients with history of congestive heart failure may result in shortness of breath, pulmonary edema, and decompensation with resultant heart failure; weight gain; swelling or edema; medication-induced neural toxicity; particulate matter embolism and blood vessel occlusion with resultant organ, and/or nervous system  infarction; and/or aseptic necrosis of one or more joints. Finally, the patient was informed that Medicine is not an exact science; therefore, there is also the possibility of unforeseen or unpredictable risks and/or possible complications that may result in a catastrophic outcome. The patient indicated having understood very clearly. We have given the patient no guarantees and we have made no promises. Enough time was given to the patient to ask questions, all of which were answered to the patient's satisfaction. Ms. Hillesheim has indicated that she wanted to continue with the procedure. Attestation: I, the ordering provider, attest that I have discussed with the patient the benefits, risks, side-effects, alternatives, likelihood of achieving goals, and potential problems during recovery for the procedure that I have provided informed consent. Date  Time: 09/22/2017  7:43 AM  Pre-Procedure Preparation:  Monitoring: As per clinic protocol. Respiration, ETCO2, SpO2, BP, heart rate and rhythm monitor placed and checked for adequate function Safety Precautions: Patient was assessed for positional comfort and pressure points before starting the procedure. Time-out: I initiated and conducted the "Time-out" before starting the procedure, as per protocol. The patient was asked to participate by confirming the accuracy of the "Time Out" information. Verification of the correct person, site, and procedure were performed and confirmed by me, the nursing staff, and the patient. "Time-out" conducted as per Joint Commission's Universal Protocol (UP.01.01.01). Time: 0828  Description of Procedure:          Position: Prone Laterality: Left Levels:  L2, L3, L4, L5, & S1 Medial Branch Level(s), at the L3-4, L4-5, and the L5-S1 lumbar facet joints. Area Prepped: Lumbosacral Prepping solution: ChloraPrep (2% chlorhexidine gluconate and 70% isopropyl alcohol) Safety Precautions: Aspiration looking for blood return was  conducted prior to all injections. At no point did we inject any substances, as a needle was being advanced. Before injecting, the patient was told to immediately notify me if she was experiencing any new onset of "ringing in the ears, or metallic taste in the mouth". No attempts were made at seeking any paresthesias. Safe injection practices and needle disposal techniques used. Medications properly checked for expiration dates. SDV (single dose vial) medications used. After  the completion of the procedure, all disposable equipment used was discarded in the proper designated medical waste containers. Local Anesthesia: Protocol guidelines were followed. The patient was positioned over the fluoroscopy table. The area was prepped in the usual manner. The time-out was completed. The target area was identified using fluoroscopy. A 12-in long, straight, sterile hemostat was used with fluoroscopic guidance to locate the targets for each level blocked. Once located, the skin was marked with an approved surgical skin marker. Once all sites were marked, the skin (epidermis, dermis, and hypodermis), as well as deeper tissues (fat, connective tissue and muscle) were infiltrated with a small amount of a short-acting local anesthetic, loaded on a 10cc syringe with a 25G, 1.5-in  Needle. An appropriate amount of time was allowed for local anesthetics to take effect before proceeding to the next step. Local Anesthetic: Lidocaine 2.0% The unused portion of the local anesthetic was discarded in the proper designated containers. Technical explanation of process:  Radiofrequency Ablation (RFA) L2 Medial Branch Nerve RFA: The target area for the L2 medial branch is at the junction of the postero-lateral aspect of the superior articular process and the superior, posterior, and medial edge of the transverse process of L3. Under fluoroscopic guidance, a Radiofrequency needle was inserted until contact was made with os over the  superior postero-lateral aspect of the pedicular shadow (target area). Sensory and motor testing was conducted to properly adjust the position of the needle. Once satisfactory placement of the needle was achieved, the numbing solution was slowly injected after negative aspiration for blood. 50mL of the nerve block solution was injected without difficulty or complication. After waiting for at least 3 minutes, the ablation was performed. Once completed, the needle was removed intact. L3 Medial Branch Nerve RFA: The target area for the L3 medial branch is at the junction of the postero-lateral aspect of the superior articular process and the superior, posterior, and medial edge of the transverse process of L4. Under fluoroscopic guidance, a Radiofrequency needle was inserted until contact was made with os over the superior postero-lateral aspect of the pedicular shadow (target area). Sensory and motor testing was conducted to properly adjust the position of the needle. Once satisfactory placement of the needle was achieved, the numbing solution was slowly injected after negative aspiration for blood.76mL of the nerve block solution was injected without difficulty or complication. After waiting for at least 3 minutes, the ablation was performed. Once completed, the needle was removed intact. L4 Medial Branch Nerve RFA: The target area for the L4 medial branch is at the junction of the postero-lateral aspect of the superior articular process and the superior, posterior, and medial edge of the transverse process of L5. Under fluoroscopic guidance, a Radiofrequency needle was inserted until contact was made with os over the superior postero-lateral aspect of the pedicular shadow (target area). Sensory and motor testing was conducted to properly adjust the position of the needle. Once satisfactory placement of the needle was achieved, the numbing solution was slowly injected after negative aspiration for blood.66mL of the  nerve block solution was injected without difficulty or complication. After waiting for at least 3 minutes, the ablation was performed. Once completed, the needle was removed intact. L5 Medial Branch Nerve RFA: The target area for the L5 medial branch is at the junction of the postero-lateral aspect of the superior articular process of S1 and the superior, posterior, and medial edge of the sacral ala. Under fluoroscopic guidance, a Radiofrequency needle was inserted until contact  was made with os over the superior postero-lateral aspect of the pedicular shadow (target area). Sensory and motor testing was conducted to properly adjust the position of the needle. Once satisfactory placement of the needle was achieved, the numbing solution was slowly injected after negative aspiration for blood. 75mL of the nerve block solution was injected without difficulty or complication. After waiting for at least 3 minutes, the ablation was performed. Once completed, the needle was removed intact. S1 Medial Branch Nerve RFA: The target area for the S1 medial branch is located inferior to the junction of the S1 superior articular process and the L5 inferior articular process, posterior, inferior, and lateral to the 6 o'clock position of the L5-S1 facet joint, just superior to the S1 posterior foramen. Under fluoroscopic guidance, the Radiofrequency needle was advanced until contact was made with os over the Target area. Sensory and motor testing was conducted to properly adjust the position of the needle. Once satisfactory placement of the needle was achieved, the numbing solution was slowly injected after negative aspiration for blood. 1 mL of the nerve block solution was injected without difficulty or complication. After waiting for at least 3 minutes, the ablation was performed. Once completed, the needle was removed intact. Radiofrequency lesioning (ablation):  Radiofrequency Generator: NeuroTherm [id number] Sensory Stimulation  Parameters: 50 Hz was used to locate & identify the nerve, making sure that the needle was positioned such that there was no sensory stimulation below 0.3 V or above 0.7 V. Motor Stimulation Parameters: 2 Hz was used to evaluate the motor component. Care was taken not to lesion any nerves that demonstrated motor stimulation of the lower extremities at an output of less than 2.5 times that of the sensory threshold, or a maximum of 2.0 V. Lesioning Technique Parameters: Standard Radiofrequency settings. (Not bipolar or pulsed.) Temperature Settings: 80 degrees C Lesioning time: 60 seconds Intra-operative Compliance: Compliant Materials & Medications: Needle(s) (Electrode/Cannula) Type: Teflon-coated, curved tip, Radiofrequency needle(s) Gauge: 22G Length: 15cm Numbing solution: 10 cc solution consisting of 9 cc of 0.2% ropivacaine, 1 cc of Decadron 10 mg/cc.  1 to 1.5 cc injected at each level on the left.  The unused portion of the solution was discarded in the proper designated containers.  Once the entire procedure was completed, the treated area was cleaned, making sure to leave some of the prepping solution back to take advantage of its long term bactericidal properties.  Illustration of the posterior view of the lumbar spine and the posterior neural structures. Laminae of L2 through S1 are labeled. DPRL5, dorsal primary ramus of L5; DPRS1, dorsal primary ramus of S1; DPR3, dorsal primary ramus of L3; FJ, facet (zygapophyseal) joint L3-L4; I, inferior articular process of L4; LB1, lateral branch of dorsal primary ramus of L1; IAB, inferior articular branches from L3 medial branch (supplies L4-L5 facet joint); IBP, intermediate branch plexus; MB3, medial branch of dorsal primary ramus of L3; NR3, third lumbar nerve root; S, superior articular process of L5; SAB, superior articular branches from L4 (supplies L4-5 facet joint also); TP3, transverse process of L3.  Vitals:   09/22/17 0915 09/22/17  0925 09/22/17 0934 09/22/17 0944  BP: 125/84 125/88 121/73   Pulse: 82     Resp: 13 14 13 14   Temp:  97.6 F (36.4 C)  98.3 F (36.8 C)  SpO2: 98% 98% 99% 97%  Weight:      Height:        Start Time: 0828 hrs. End Time: 0912 hrs.  Imaging  Guidance (Spinal):          Type of Imaging Technique: Fluoroscopy Guidance (Spinal) Indication(s): Assistance in needle guidance and placement for procedures requiring needle placement in or near specific anatomical locations not easily accessible without such assistance. Exposure Time: Please see nurses notes. Contrast: None used. Fluoroscopic Guidance: I was personally present during the use of fluoroscopy. "Tunnel Vision Technique" used to obtain the best possible view of the target area. Parallax error corrected before commencing the procedure. "Direction-depth-direction" technique used to introduce the needle under continuous pulsed fluoroscopy. Once target was reached, antero-posterior, oblique, and lateral fluoroscopic projection used confirm needle placement in all planes. Images permanently stored in EMR. Interpretation: No contrast injected. I personally interpreted the imaging intraoperatively. Adequate needle placement confirmed in multiple planes. Permanent images saved into the patient's record.  Antibiotic Prophylaxis:   Anti-infectives (From admission, onward)   None     Indication(s): None identified  Post-operative Assessment:  Post-procedure Vital Signs:  Pulse/HCG Rate: 8284 Temp: 98.3 F (36.8 C) Resp: 14 BP: 121/73 SpO2: 97 %  EBL: None  Complications: No immediate post-treatment complications observed by team, or reported by patient.  Note: The patient tolerated the entire procedure well. A repeat set of vitals were taken after the procedure and the patient was kept under observation following institutional policy, for this type of procedure. Post-procedural neurological assessment was performed, showing return to  baseline, prior to discharge. The patient was provided with post-procedure discharge instructions, including a section on how to identify potential problems. Should any problems arise concerning this procedure, the patient was given instructions to immediately contact us, at any time, without hesitation. In any case, we plan to contact the patient by telephone for a follow-up status report regarding this interventional procedure.  Comments:  No additional relevant information. 5 out of 5 strength bilateral lower extremity: Plantar flexion, dorsiflexion, knee flexion, knee extension.  Plan of Care    Imaging Orders     DG C-Arm 1-60 Min-No Report Procedure Orders    No procedure(s) ordered today    Medications ordered for procedure: Meds ordered this encounter  Medications  . lactated ringers infusion 1,000 mL  . fentaNYL (SUBLIMAZE) injection 25-100 mcg    Make sure Narcan is available in the pyxis when using this medication. In the event of respiratory depression (RR< 8/min): Titrate NARCAN (naloxone) in increments of 0.1 to 0.2 mg IV at 2-3 minute intervals, until desired degree of reversal.  . lidocaine (XYLOCAINE) 2 % (with pres) injection 400 mg  . ropivacaine (PF) 2 mg/mL (0.2%) (NAROPIN) injection 10 mL  . dexamethasone (DECADRON) injection 10 mg   Medications administered: We administered lactated ringers, fentaNYL, lidocaine, ropivacaine (PF) 2 mg/mL (0.2%), and dexamethasone.  See the medical record for exact dosing, route, and time of administration.  New Prescriptions   No medications on file   Disposition: Discharge home  Discharge Date & Time: 09/22/2017; 0946 hrs.   Physician-requested Follow-up: Return in about 6 weeks (around 11/03/2017) for Post Procedure Evaluation.  Future Appointments  Date Time Provider [place]  10/27/2017 10:50 AM Emeterio Reeve, DO [facility] None  11/03/2017 12:45 PM Gillis Santa, MD [facility] None  11/13/2017 10:30 AM Vevelyn Francois, NP [facility] None   Primary Care Physician: Emeterio Reeve, DO Location: [facility] Out[facility] Management Facility Note by: Gillis Santa, MD Date: 09/22/2017; Time: 10:37 AM  Disclaimer:  Medicine is not an exact science. The only guarantee in medicine is that nothing is guaranteed. It  is important to note that the decision to proceed with this intervention was based on the information collected from the patient. The Data and conclusions were drawn from the patient's questionnaire, the interview, and the physical examination. Because the information was provided in large part by the patient, it cannot be guaranteed that it has not been purposely or unconsciously manipulated. Every effort has been made to obtain as much relevant data as possible for this evaluation. It is important to note that the conclusions that lead to this procedure are derived in large part from the available data. Always take into account that the treatment will also be dependent on availability of resources and existing treatment guidelines, considered by other Pain Management Practitioners as being common knowledge and practice, at the time of the intervention. For Medico-Legal purposes, it is also important to point out that variation in procedural techniques and pharmacological choices are the acceptable norm. The indications, contraindications, technique, and results of the above procedure should only be interpreted and judged by a Board-Certified Interventional Pain Specialist with extensive familiarity and expertise in the same exact procedure and technique.

## 2017-09-22 NOTE — Patient Instructions (Signed)

## 2017-09-23 ENCOUNTER — Telehealth: Payer: Self-pay

## 2017-09-23 NOTE — Telephone Encounter (Signed)
Post procedure phone call.  LM 

## 2017-09-30 ENCOUNTER — Ambulatory Visit: Payer: BLUE CROSS/BLUE SHIELD | Admitting: Pain Medicine

## 2017-10-16 ENCOUNTER — Telehealth: Payer: Self-pay

## 2017-10-16 MED ORDER — FLUCONAZOLE 150 MG PO TABS: 150.0000 mg | ORAL_TABLET | Freq: Once | ORAL | 1 refills | Status: AC

## 2017-10-16 NOTE — Telephone Encounter (Signed)
Sent!

## 2017-10-16 NOTE — Telephone Encounter (Signed)
Pt called, stating she believes she may have a yeast infection. Requesting diflucan rx (2 pills) to be sent to [facility] pharmacy. As per pt, she gets "yeast infection" occasionally due to incontinence pads usage. Pls advise, thanks.

## 2017-10-22 ENCOUNTER — Other Ambulatory Visit: Payer: Self-pay | Admitting: Nurse Practitioner

## 2017-10-22 ENCOUNTER — Telehealth: Payer: Self-pay | Admitting: *Deleted

## 2017-10-22 MED ORDER — METHOCARBAMOL 750 MG PO TABS: 750.0000 mg | ORAL_TABLET | Freq: Three times a day (TID) | ORAL | 0 refills | Status: DC | PRN

## 2017-10-22 NOTE — Telephone Encounter (Signed)
Medication sent.

## 2017-10-22 NOTE — Telephone Encounter (Signed)
Patient notified that [employer] Will call in refill for Robaxin.

## 2017-10-27 ENCOUNTER — Ambulatory Visit: Payer: BLUE CROSS/BLUE SHIELD | Admitting: Osteopathic Medicine

## 2017-11-03 ENCOUNTER — Encounter: Payer: Self-pay | Admitting: Student in an Organized Health Care Education/Training Program

## 2017-11-03 ENCOUNTER — Other Ambulatory Visit: Payer: Self-pay

## 2017-11-03 ENCOUNTER — Ambulatory Visit
Payer: BLUE CROSS/BLUE SHIELD | Attending: Student in an Organized Health Care Education/Training Program | Admitting: Student in an Organized Health Care Education/Training Program

## 2017-11-03 VITALS — BP 157/88 | HR 97 | Temp 98.7°F | Resp 16 | Ht 64.0 in | Wt 260.0 lb

## 2017-11-03 DIAGNOSIS — F329 Major depressive disorder, single episode, unspecified: Secondary | ICD-10-CM | POA: Insufficient documentation

## 2017-11-03 DIAGNOSIS — Z9049 Acquired absence of other specified parts of digestive tract: Secondary | ICD-10-CM | POA: Insufficient documentation

## 2017-11-03 DIAGNOSIS — Z8349 Family history of other endocrine, nutritional and metabolic diseases: Secondary | ICD-10-CM | POA: Insufficient documentation

## 2017-11-03 DIAGNOSIS — F17210 Nicotine dependence, cigarettes, uncomplicated: Secondary | ICD-10-CM | POA: Insufficient documentation

## 2017-11-03 DIAGNOSIS — M4726 Other spondylosis with radiculopathy, lumbar region: Secondary | ICD-10-CM | POA: Diagnosis not present

## 2017-11-03 DIAGNOSIS — Z825 Family history of asthma and other chronic lower respiratory diseases: Secondary | ICD-10-CM | POA: Diagnosis not present

## 2017-11-03 DIAGNOSIS — J209 Acute bronchitis, unspecified: Secondary | ICD-10-CM | POA: Diagnosis not present

## 2017-11-03 DIAGNOSIS — Z79891 Long term (current) use of opiate analgesic: Secondary | ICD-10-CM | POA: Insufficient documentation

## 2017-11-03 DIAGNOSIS — E559 Vitamin D deficiency, unspecified: Secondary | ICD-10-CM | POA: Insufficient documentation

## 2017-11-03 DIAGNOSIS — Z886 Allergy status to analgesic agent status: Secondary | ICD-10-CM | POA: Insufficient documentation

## 2017-11-03 DIAGNOSIS — G8929 Other chronic pain: Secondary | ICD-10-CM

## 2017-11-03 DIAGNOSIS — Z9889 Other specified postprocedural states: Secondary | ICD-10-CM | POA: Insufficient documentation

## 2017-11-03 DIAGNOSIS — Z91040 Latex allergy status: Secondary | ICD-10-CM | POA: Insufficient documentation

## 2017-11-03 DIAGNOSIS — M5416 Radiculopathy, lumbar region: Secondary | ICD-10-CM | POA: Diagnosis not present

## 2017-11-03 DIAGNOSIS — G894 Chronic pain syndrome: Secondary | ICD-10-CM | POA: Insufficient documentation

## 2017-11-03 DIAGNOSIS — Z818 Family history of other mental and behavioral disorders: Secondary | ICD-10-CM | POA: Insufficient documentation

## 2017-11-03 DIAGNOSIS — M7918 Myalgia, other site: Secondary | ICD-10-CM | POA: Diagnosis not present

## 2017-11-03 DIAGNOSIS — Z87442 Personal history of urinary calculi: Secondary | ICD-10-CM | POA: Insufficient documentation

## 2017-11-03 DIAGNOSIS — J45901 Unspecified asthma with (acute) exacerbation: Secondary | ICD-10-CM | POA: Diagnosis not present

## 2017-11-03 DIAGNOSIS — K219 Gastro-esophageal reflux disease without esophagitis: Secondary | ICD-10-CM | POA: Diagnosis not present

## 2017-11-03 DIAGNOSIS — Z881 Allergy status to other antibiotic agents status: Secondary | ICD-10-CM | POA: Diagnosis not present

## 2017-11-03 DIAGNOSIS — Z880 Allergy status to penicillin: Secondary | ICD-10-CM | POA: Insufficient documentation

## 2017-11-03 DIAGNOSIS — F411 Generalized anxiety disorder: Secondary | ICD-10-CM | POA: Insufficient documentation

## 2017-11-03 DIAGNOSIS — Z79899 Other long term (current) drug therapy: Secondary | ICD-10-CM | POA: Diagnosis not present

## 2017-11-03 DIAGNOSIS — M47816 Spondylosis without myelopathy or radiculopathy, lumbar region: Secondary | ICD-10-CM | POA: Diagnosis not present

## 2017-11-03 DIAGNOSIS — Z882 Allergy status to sulfonamides status: Secondary | ICD-10-CM | POA: Diagnosis not present

## 2017-11-03 DIAGNOSIS — I10 Essential (primary) hypertension: Secondary | ICD-10-CM | POA: Insufficient documentation

## 2017-11-03 DIAGNOSIS — K589 Irritable bowel syndrome without diarrhea: Secondary | ICD-10-CM | POA: Insufficient documentation

## 2017-11-03 DIAGNOSIS — Z823 Family history of stroke: Secondary | ICD-10-CM | POA: Insufficient documentation

## 2017-11-03 NOTE — Progress Notes (Signed)
Safety precautions to be maintained throughout the outpatient stay will include: orient to surroundings, keep bed in low position, maintain call bell within reach at all times, provide assistance with transfer out of bed and ambulation.  

## 2017-11-03 NOTE — Progress Notes (Signed)
Patient's Name: Mary Cruz  MRN: [id number]  Referring Provider: Emeterio Reeve, DO  DOB: 03-Jun-1975  PCP: Emeterio Reeve, DO  DOS: 11/03/2017  Note by: Gillis Santa, MD  Service setting: Ambulatory outpatient  Specialty: Interventional Pain Management  Location: [facility] (AMB) Pain Management Facility    Patient type: Established   Primary Reason(s) for Visit: Encounter for post-procedure evaluation of chronic illness with mild to moderate exacerbation CC: Follow-up  HPI  Mary Cruz is a 42 y.o. year old, female patient, who comes today for a post-procedure evaluation. She has GERD; Irritable bowel syndrome; KIDNEY STONES; Essential hypertension, benign; Chronic pain syndrome; Generalized anxiety disorder; Obesity; Long term current use of opiate analgesic; Long term prescription opiate use; Opiate use (30 MME/Day); Opiate dependence ([place]); Encounter for therapeutic drug level monitoring; Musculoskeletal pain; Muscle cramping; Osteoarthrosis; Vitamin D insufficiency; Presence of functional implant ([employer] Lumbar spinal cord stimulator implant); Encounter for management of implanted device; Chronic low back pain (Bilateral) (R>L); Chronic lumbar radicular pain (Right L5 dermatome; Left S1 Dermatome) (Bilateral) (R>L); Chronic hip pain (Location of Tertiary source of pain) (Bilateral) (R>L); Chronic neck pain (Location of Primary Source of Pain) (Bilateral) (L>R); Chronic upper extremity pain (Location of Secondary source of pain) (Bilateral) (L>R); Morbid obesity ([place]); Nicotine dependence; Chronic cervical radicular pain (C7 Dermatome) (Bilateral) (L>R); Chronic lower extremity pain (Bilateral) (R>L); Right knee pain; Anxiety and depression; Implant pain at battery site (right buttocks area); Lumbar spondylosis; Lumbar facet syndrome (Bilateral) (R>L); Positive ANA (antinuclear antibody); Spinal cord stimulator status; Battery end of life of spinal cord stimulator; Neurogenic pain;  Fibromyalgia; Family history of thyroid disease; and Pain in right knee on their problem list. Her primarily concern today is the Follow-up  Pain Assessment: Location: Lower, Right Back Radiating: Radiates from lower right back to back of right leg to feet where pinky toe is.  Onset: More than a month ago Duration: Chronic pain Quality: Constant, Sharp, Stabbing Severity: 5 /10 (subjective, self-reported pain score)  Note: Reported level is inconsistent with clinical observations. Clinically the patient looks like a 2/10 A 2/10 is viewed as "Mild to Moderate" and described as noticeable and distracting. Impossible to hide from other people. More frequent flare-ups. Still possible to adapt and function close to normal. It can be very annoying and may have occasional stronger flare-ups. With discipline, patients may get used to it and adapt. Information on the proper use of the pain scale provided to the patient today. When using our objective Pain Scale, levels between 6 and 10/10 are said to belong in an emergency room, as it progressively worsens from a 6/10, described as severely limiting, requiring emergency care not usually available at an outpatient pain management facility. At a 6/10 level, communication becomes difficult and requires great effort. Assistance to reach the emergency department may be required. Facial flushing and profuse sweating along with potentially dangerous increases in heart rate and blood pressure will be evident. Effect on ADL: "Hard to do household work" Timing: Constant Modifying factors: Procedures, medication BP: (!) 157/88  HR: 97  Mary Cruz comes in today for post-procedure evaluation after the treatment done on 10/22/2017.  Further details on both, my assessment(s), as well as the proposed treatment plan, please see below.  Post-Procedure Assessment  09/22/2017 Procedure: Left L2, L3, L4, L5, S1 RFA Pre-procedure pain score:  5/10 Post-procedure pain score:  0/10         Influential Factors: BMI: 44.63 kg/m Intra-procedural challenges: None observed.  Assessment challenges: None detected.              Reported side-effects: None.        Post-procedural adverse reactions or complications: None reported         Sedation: Please see nurses note. When no sedatives are used, the analgesic levels obtained are directly associated to the effectiveness of the local anesthetics. However, when sedation is provided, the level of analgesia obtained during the initial 1 hour following the intervention, is believed to be the result of a combination of factors. These factors may include, but are not limited to: 1. The effectiveness of the local anesthetics used. 2. The effects of the analgesic(s) and/or anxiolytic(s) used. 3. The degree of discomfort experienced by the patient at the time of the procedure. 4. The patients ability and reliability in recalling and recording the events. 5. The presence and influence of possible secondary gains and/or psychosocial factors. Reported result: Relief experienced during the 1st hour after the procedure: 100 % (Ultra-Short Term Relief)            Interpretative annotation: Clinically appropriate result. Analgesia during this period is likely to be Local Anesthetic and/or IV Sedative (Analgesic/Anxiolytic) related.          Effects of local anesthetic: The analgesic effects attained during this period are directly associated to the localized infiltration of local anesthetics and therefore cary significant diagnostic value as to the etiological location, or anatomical origin, of the pain. Expected duration of relief is directly dependent on the pharmacodynamics of the local anesthetic used. Long-acting (4-6 hours) anesthetics used.  Reported result: Relief during the next 4 to 6 hour after the procedure: 100 % (Short-Term Relief)            Interpretative annotation: Clinically appropriate result. Analgesia during this  period is likely to be Local Anesthetic-related.          Long-term benefit: Defined as the period of time past the expected duration of local anesthetics (1 hour for short-acting and 4-6 hours for long-acting). With the possible exception of prolonged sympathetic blockade from the local anesthetics, benefits during this period are typically attributed to, or associated with, other factors such as analgesic sensory neuropraxia, antiinflammatory effects, or beneficial biochemical changes provided by agents other than the local anesthetics.  Reported result: Extended relief following procedure: 100 % (Long-Term Relief)            Interpretative annotation: Clinically possible results. Complete relief. Therapeutic success. Inflammation plays a part in the etiology to the pain.          Current benefits: Defined as reported results that persistent at this point in time.   Analgesia: 75-100 % Ms. Broder reports improvement of axial and dermatomal symptoms. Function: Ms. Brigandi reports improvement in function ROM: Ms. Oyervides reports improvement in ROM Interpretative annotation: Ongoing benefit. Therapeutic success. Adequate RF ablation.          Interpretation: Results would suggest adequate radiofrequency ablation.                  Plan:  Proceed with Radiofrequency Ablation for the purpose of attaining long-term benefits.  Plan for contralateral RFA, right L2, L3, L4, L5, S1                Laboratory Chemistry  Inflammation Markers (CRP: Acute Phase) (ESR: Chronic Phase) Lab Results  Component Value Date   CRP 1.0 (H) 04/05/2015   ESRSEDRATE 35 (H) 04/05/2015   LATICACIDVEN  1.0 10/10/2013                         Rheumatology Markers Lab Results  Component Value Date   ANA POS (A) 09/11/2015                        Renal Function Markers Lab Results  Component Value Date   BUN 16 10/24/2016   CREATININE 1.01 25/85/2778   BCR NOT APPLICABLE 24/23/5361   GFRAA 81 10/24/2016    GFRNONAA 70 10/24/2016                             Hepatic Function Markers Lab Results  Component Value Date   AST 18 10/24/2016   ALT 16 10/24/2016   ALBUMIN 3.8 07/22/2016   ALKPHOS 96 07/22/2016                        Electrolytes Lab Results  Component Value Date   NA 138 10/24/2016   K 4.0 10/24/2016   CL 103 10/24/2016   CALCIUM 9.4 10/24/2016   MG 2.0 04/05/2015                        Neuropathy Markers No results found for: VITAMINB12, FOLATE, HGBA1C, HIV                      CNS Tests No results found for: COLORCSF, APPEARCSF, RBCCOUNTCSF, WBCCSF, POLYSCSF, LYMPHSCSF, EOSCSF, PROTEINCSF, GLUCCSF, JCVIRUS, CSFOLI, IGGCSF                      Bone Pathology Markers Lab Results  Component Value Date   VD25OH 25 (L) 10/24/2016                         Coagulation Parameters Lab Results  Component Value Date   INR 1.05 06/21/2016   LABPROT 13.8 06/21/2016   APTT 29 06/21/2016   PLT 285 10/24/2016                        Cardiovascular Markers Lab Results  Component Value Date   HGB 11.8 10/24/2016   HCT 35.6 10/24/2016                         CA Markers No results found for: CEA, CA125, LABCA2                      Note: Lab results reviewed.  Recent Diagnostic Imaging Results  DG C-Arm 1-60 Min-No Report Fluoroscopy was utilized by the requesting physician.  No radiographic  interpretation.   Complexity Note: Imaging results reviewed. Results shared with Ms. Therriault, using Layman's terms.                         Meds   Current Outpatient Medications:  .  albuterol (PROVENTIL HFA;VENTOLIN HFA) 108 (90 Base) MCG/ACT inhaler, Inhale 1-2 puffs into the lungs every 6 (six) hours as needed for wheezing or shortness of breath., Disp: 1 Inhaler, Rfl: 0 .  amitriptyline (ELAVIL) 100 MG tablet, TAKE 1 TABLET (100 MG TOTAL) BY MOUTH AT BEDTIME., Disp: 90 tablet, Rfl: 2 .  Cholecalciferol (VITAMIN D3)  2000 units TABS, Take 1 tablet by mouth daily., Disp: ,  Rfl:  .  clonazePAM (KLONOPIN) 0.5 MG tablet, Take 0.5-1 tablets (0.25-0.5 mg total) by mouth 2 (two) times daily as needed for anxiety., Disp: 30 tablet, Rfl: 0 .  Diclofenac Sodium (PENNSAID) 2 % SOLN, Pennsaid 20 mg/gram/actuation (2 %) topical soln in metered-dose pump  APPLY 2 (TWO) PUMPS TOPICALLY TO AFFECTED AREA(S) ON KNEES TWICE A DAY, Disp: , Rfl:  .  DULoxetine (CYMBALTA) 60 MG capsule, Take 1 capsule (60 mg total) by mouth 2 (two) times daily., Disp: 180 capsule, Rfl: 3 .  Ibuprofen-Famotidine 800-26.6 MG TABS, Take by mouth 3 (three) times daily., Disp: , Rfl:  .  methocarbamol (ROBAXIN) 750 MG tablet, Take 1 tablet (750 mg total) by mouth every 8 (eight) hours as needed for muscle spasms., Disp: 90 tablet, Rfl: 0 .  Naproxen-Esomeprazole (VIMOVO) 500-20 MG TBEC, Take by mouth., Disp: , Rfl:  .  omeprazole (PRILOSEC) 40 MG capsule, Take 1 capsule (40 mg total) by mouth daily., Disp: 90 capsule, Rfl: 3 .  oxyCODONE (OXY IR/ROXICODONE) 5 MG immediate release tablet, Take 1 tablet (5 mg total) by mouth every 6 (six) hours as needed for severe pain., Disp: 120 tablet, Rfl: 0 .  phenazopyridine (PYRIDIUM) 200 MG tablet, phenazopyridine 200 mg tablet, Disp: , Rfl:  .  predniSONE (RAYOS) 5 MG TBEC, Take by mouth., Disp: , Rfl:  .  QUEtiapine (SEROQUEL) 25 MG tablet, quetiapine 25 mg tablet, Disp: , Rfl:  .  oxyCODONE (OXY IR/ROXICODONE) 5 MG immediate release tablet, Take 1 tablet (5 mg total) by mouth every 4 (four) hours as needed for severe pain., Disp: 120 tablet, Rfl: 0 .  oxyCODONE (OXY IR/ROXICODONE) 5 MG immediate release tablet, Take 1 tablet (5 mg total) by mouth every 6 (six) hours as needed for severe pain., Disp: 120 tablet, Rfl: 0 .  pregabalin (LYRICA) 150 MG capsule, Take 1 capsule (150 mg total) by mouth every 8 (eight) hours., Disp: 90 capsule, Rfl: 1  ROS  Constitutional: Denies any fever or chills Gastrointestinal: No reported hemesis, hematochezia, vomiting, or acute GI  distress Musculoskeletal: Denies any acute onset joint swelling, redness, loss of ROM, or weakness Neurological: No reported episodes of acute onset apraxia, aphasia, dysarthria, agnosia, amnesia, paralysis, loss of coordination, or loss of consciousness  Allergies  Ms. Norby is allergic to keflex [cephalexin]; amoxicillin; celebrex [celecoxib]; pentazocine lactate; bactrim [sulfamethoxazole-trimethoprim]; doxycycline; erythromycin; hydromorphone hcl; latex; and nabumetone.  [place]  Drug: Ms. Huss  reports that she does not use drugs. Alcohol:  reports that she does not drink alcohol. Tobacco:  reports that she has been smoking cigarettes. She has never used smokeless tobacco. Medical:  has a past medical history of Abscess of axillary fold (10/10/2013), Acute bronchitis with asthma with acute exacerbation (01/23/2015), Fibromyalgia, Hypertension, Lupus ([place]), Osteoarthritis, Pain, and Sepsis affecting skin (10/10/2013). Surgical: Ms. Kniskern  has a past surgical history that includes spinal cord stimulator; Occipital nerve stimulator insertion; Cholecystectomy (2000); Knee surgery; Shoulder surgery; Tubal ligation (2000); Endometrial ablation; Spinal cord stimulator implant (2008); Knee surgery (Right, 02-2015); Breast surgery; and Spinal cord stimulator insertion (N/A, 06/21/2016). Family: family history includes Asthma in her mother; Depression in her mother; Fibromyalgia in her unknown relative; Stroke in her unknown relative; Thyroid disease in her unknown relative.  Constitutional Exam  General appearance: Well nourished, well developed, and well hydrated. In no apparent acute distress Vitals:   11/03/17 1321  BP: (!) 157/88  Pulse: 97  Resp: 16  Temp: 98.7 F (37.1 C)  SpO2: 98%  Weight: 260 lb (117.9 kg)  Height: _0  (1.626 m)   BMI Assessment: Estimated body mass index is 44.63 kg/m as calculated from the following:   Height as of this encounter: _1  (1.626 m).   Weight as  of this encounter: 260 lb (117.9 kg).  BMI interpretation table: BMI level Category Range association with higher incidence of chronic pain  <18 kg/m2 Underweight   18.5-24.9 kg/m2 Ideal body weight   25-29.9 kg/m2 Overweight Increased incidence by 20%  30-34.9 kg/m2 Obese (Class I) Increased incidence by 68%  35-39.9 kg/m2 Severe obesity (Class II) Increased incidence by 136%  >40 kg/m2 Extreme obesity (Class III) Increased incidence by 254%   Patient's current BMI Ideal Body weight  Body mass index is 44.63 kg/m. Ideal body weight: 54.7 kg (120 lb 9.5 oz) Adjusted ideal body weight: 80 kg (176 lb 5.7 oz)   BMI Readings from Last 4 Encounters:  11/03/17 44.63 kg/m  09/22/17 44.63 kg/m  09/15/17 48.25 kg/m  07/24/17 45.66 kg/m   Wt Readings from Last 4 Encounters:  11/03/17 260 lb (117.9 kg)  09/22/17 260 lb (117.9 kg)  09/15/17 281 lb 1.6 oz (127.5 kg)  07/24/17 266 lb (120.7 kg)  Psych/Mental status: Alert, oriented x 3 (person, place, & time)       Eyes: PERLA Respiratory: No evidence of acute respiratory distress  Cervical Spine Area Exam  Skin & Axial Inspection: No masses, redness, edema, swelling, or associated skin lesions Alignment: Symmetrical Functional ROM: Unrestricted ROM      Stability: No instability detected Muscle Tone/Strength: Functionally intact. No obvious neuro-muscular anomalies detected. Sensory (Neurological): Unimpaired Palpation: No palpable anomalies              Upper Extremity (UE) Exam    Side: Right upper extremity  Side: Left upper extremity  Skin & Extremity Inspection: Skin color, temperature, and hair growth are WNL. No peripheral edema or cyanosis. No masses, redness, swelling, asymmetry, or associated skin lesions. No contractures.  Skin & Extremity Inspection: Skin color, temperature, and hair growth are WNL. No peripheral edema or cyanosis. No masses, redness, swelling, asymmetry, or associated skin lesions. No contractures.   Functional ROM: Unrestricted ROM          Functional ROM: Unrestricted ROM          Muscle Tone/Strength: Functionally intact. No obvious neuro-muscular anomalies detected.  Muscle Tone/Strength: Functionally intact. No obvious neuro-muscular anomalies detected.  Sensory (Neurological): Unimpaired          Sensory (Neurological): Unimpaired          Palpation: No palpable anomalies              Palpation: No palpable anomalies              Provocative Test(s):  Phalen's test: deferred Tinel's test: deferred Apley's scratch test (touch opposite shoulder):  Action 1 (Across chest): deferred Action 2 (Overhead): deferred Action 3 (LB reach): deferred   Provocative Test(s):  Phalen's test: deferred Tinel's test: deferred Apley's scratch test (touch opposite shoulder):  Action 1 (Across chest): deferred Action 2 (Overhead): deferred Action 3 (LB reach): deferred    Thoracic Spine Area Exam  Skin & Axial Inspection: No masses, redness, or swelling Alignment: Symmetrical Functional ROM: Unrestricted ROM Stability: No instability detected Muscle Tone/Strength: Functionally intact. No obvious neuro-muscular anomalies detected. Sensory (Neurological): Unimpaired Muscle strength & Tone: No palpable anomalies  Lumbar  Spine Area Exam  Skin & Axial Inspection: No masses, redness, or swelling Alignment: Symmetrical Functional ROM: Improved after treatment affecting primarily the left; decreased range of motion on the right Stability: No instability detected Muscle Tone/Strength: Functionally intact. No obvious neuro-muscular anomalies detected. Sensory (Neurological): Articular pain pattern on the right Palpation: No palpable anomalies       Provocative Tests: Hyperextension/rotation test: (+) on the right for facet joint pain., left side improved after RFA Lumbar quadrant test (Kemp's test): deferred today       Lateral bending test: (+) due to pain., right greater than left Patrick's  Maneuver: deferred today                   FABER test: deferred today                   S-I anterior distraction/compression test: deferred today         S-I lateral compression test: deferred today         S-I Thigh-thrust test: deferred today         S-I Gaenslen's test: deferred today          Gait & Posture Assessment  Ambulation: Unassisted Gait: Relatively normal for age and body habitus Posture: WNL   Lower Extremity Exam    Side: Right lower extremity  Side: Left lower extremity  Stability: No instability observed          Stability: No instability observed          Skin & Extremity Inspection: Skin color, temperature, and hair growth are WNL. No peripheral edema or cyanosis. No masses, redness, swelling, asymmetry, or associated skin lesions. No contractures.  Skin & Extremity Inspection: Left foot and boot due to plantar fasciitis exacerbation  Functional ROM: Unrestricted ROM                  Functional ROM: Mechanically restricted ROM due to being in left boot for plantar fasciitis                  Muscle Tone/Strength: Functionally intact. No obvious neuro-muscular anomalies detected.  Muscle Tone/Strength: Functionally intact. No obvious neuro-muscular anomalies detected.  Sensory (Neurological): Unimpaired  Sensory (Neurological): Neurogenic pain pattern  Palpation: No palpable anomalies  Palpation: No palpable anomalies   Assessment  Primary Diagnosis & Pertinent Problem List: The primary encounter diagnosis was Lumbar spondylosis. Diagnoses of Lumbar facet syndrome (Bilateral)  and Chronic lumbar radicular pain (Right L5 dermatome; Left S1 Dermatome) (Bilateral) (R>L) were also pertinent to this visit.  Status Diagnosis  Responding Responding Persistent 1. Lumbar spondylosis   2. Lumbar facet syndrome (Bilateral)    3. Chronic lumbar radicular pain (Right L5 dermatome; Left S1 Dermatome) (Bilateral) (R>L)     General Recommendations: The pain condition that the  patient suffers from is best treated with a multidisciplinary approach that involves an increase in physical activity to prevent de-conditioning and worsening of the pain cycle, as well as psychological counseling (formal and/or informal) to address the co-morbid psychological affects of pain. Treatment will often involve judicious use of pain medications and interventional procedures to decrease the pain, allowing the patient to participate in the physical activity that will ultimately produce long-lasting pain reductions. The goal of the multidisciplinary approach is to return the patient to a higher level of overall function and to restore their ability to perform activities of daily living.  42 year old female with a history of lumbar spondylosis who follows  up status post left L2, L3, L4, L5, S1 radiofrequency ablation who endorses complete pain relief in regards to her left-sided axial low back pain and buttock pain.  Patient is endorsing 100% pain relief that is ongoing at this time.  She endorses improvement in range of function and her ability to perform activities of daily living.  Patient is requesting to have the right side radiofrequency ablation performed.  This is reasonable.  Risks and benefits of this procedure have been discussed and patient would like to proceed.  Plan: -Right L2, L3, L4, L5, S1 RFA under fluoroscopy with sedation.  This is for lumbar spondylosis. 100% PAIN RELIEF s/p LEFT SIDE. -Continue MM with [place], procedure(s), and/or referral(s): Orders Placed This Encounter  Procedures  . Radiofrequency,Lumbar   Time Note: Greater than 50% of the 25 minute(s) of face-to-face time spent with Ms. Abad, was spent in counseling/coordination of care regarding: the appropriate use of the pain scale, Ms. Durkee primary cause of pain, the treatment plan, treatment alternatives, the risks and possible complications of proposed treatment, going  over the informed consent, the results, interpretation and significance of  her recent diagnostic interventional treatment(s), realistic expectations, the goals of pain management (increased in functionality) and the need to bring and keep the BMI below 30.  Provider-requested follow-up: Return in about 2 weeks (around 11/17/2017) for Procedure.  Future Appointments  Date Time Provider [place]  11/13/2017 10:30 AM Vevelyn Francois, NP [facility] None  12/01/2017  9:00 AM Gillis Santa, MD [facility] None    Primary Care Physician: Emeterio Reeve, DO Location: [facility] Facility Note by: Gillis Santa, M.D Date: 11/03/2017; Time: 2:42 PM  Patient Instructions   ____________________________________________________________________________________________  Preparing for Procedure with Sedation  Instructions: . Oral Intake: Do not eat or drink anything for at least 8 hours prior to your procedure. . Transportation: Public transportation is not allowed. Bring an adult driver. The driver must be physically present in our waiting room before any procedure can be started. [name] Physical Assistance: Bring an adult physically capable of assisting you, in the event you need help. This adult should keep you company at home for at least 6 hours after the procedure. . Blood Pressure Medicine: Take your blood pressure medicine with a sip of water the morning of the procedure. . Blood thinners: Notify our staff if you are taking any blood thinners. Depending on which one you take, there will be specific instructions on how and when to stop it. . Diabetics on insulin: Notify the staff so that you can be scheduled 1st case in the morning. If your diabetes requires high dose insulin, take only  of your normal insulin dose the morning of the procedure and notify the staff that you have done so. . Preventing infections: Shower with an antibacterial soap the morning of your  procedure. . Build-up your immune system: Take 1000 mg of Vitamin C with every meal (3 times a day) the day prior to your procedure. [name] Antibiotics: Inform the staff if you have a condition or reason that requires you to take antibiotics before dental procedures. . Pregnancy: If you are pregnant, call and cancel the procedure. . Sickness: If you have a cold, fever, or any active infections, call and cancel the procedure. . Arrival: You must be in the facility at least 30 minutes prior to your scheduled procedure. . Children: Do not bring children with you. Percell Miller appropriately:  Bring dark clothing that you would not mind if they get stained. . Valuables: Do not bring any jewelry or valuables.  Procedure appointments are reserved for interventional treatments only. [name] No Prescription Refills. . No medication changes will be discussed during procedure appointments. . No disability issues will be discussed.  Reasons to call and reschedule or cancel your procedure: (Following these recommendations will minimize the risk of a serious complication.) . Surgeries: Avoid having procedures within 2 weeks of any surgery. (Avoid for 2 weeks before or after any surgery). . Flu Shots: Avoid having procedures within 2 weeks of a flu shots or . (Avoid for 2 weeks before or after immunizations). . Barium: Avoid having a procedure within 7-10 days after having had a radiological study involving the use of radiological contrast. (Myelograms, Barium swallow or enema study). . Heart attacks: Avoid any elective procedures or surgeries for the initial 6 months after a "Myocardial Infarction" (Heart Attack). . Blood thinners: It is imperative that you stop these medications before procedures. Let us know if you if you take any blood thinner.  . Infection: Avoid procedures during or within two weeks of an infection (including chest colds or gastrointestinal problems). Symptoms associated with infections include:  Localized redness, fever, chills, night sweats or profuse sweating, burning sensation when voiding, cough, congestion, stuffiness, runny nose, sore throat, diarrhea, nausea, vomiting, cold or Flu symptoms, recent or current infections. It is specially important if the infection is over the area that we intend to treat. [name] Heart and lung problems: Symptoms that may suggest an active cardiopulmonary problem include: cough, chest pain, breathing difficulties or shortness of breath, dizziness, ankle swelling, uncontrolled high or unusually low blood pressure, and/or palpitations. If you are experiencing any of these symptoms, cancel your procedure and contact your primary care physician for an evaluation.  Remember:  Regular Business hours are:  Monday to Thursday 8:00 AM to 4:00 PM  Provider's Schedule: Milinda Pointer, MD:  Procedure days: Tuesday and Thursday 7:30 AM to 4:00 PM  Gillis Santa, MD:  Procedure days: Monday and Wednesday 7:30 AM to 4:00 PM ____________________________________________________________________________________________   Radiofrequency Lesioning Radiofrequency lesioning is a procedure that is performed to relieve pain. The procedure is often used for back, neck, or arm pain. Radiofrequency lesioning involves the use of a machine that creates radio waves to make heat. During the procedure, the heat is applied to the nerve that carries the pain signal. The heat damages the nerve and interferes with the pain signal. Pain relief usually starts about 2 weeks after the procedure and lasts for 6 months to 1 year. Tell a health care provider about:  Any allergies you have.  All medicines you are taking, including vitamins, herbs, eye drops, creams, and over-the-counter medicines.  Any problems you or family members have had with anesthetic medicines.  Any blood disorders you have.  Any surgeries you have had.  Any medical conditions you have.  Whether you are pregnant  or may be pregnant. What are the risks? Generally, this is a safe procedure. However, problems may occur, including:  Pain or soreness at the injection site.  Infection at the injection site.  Damage to nerves or blood vessels.  What happens before the procedure?  Ask your health care provider about: ? Changing or stopping your regular medicines. This is especially important if you are taking diabetes medicines or blood thinners. ? Taking medicines such as aspirin and ibuprofen. These medicines can thin your blood. Do not take these  medicines before your procedure if your health care provider instructs you not to.  Follow instructions from your health care provider about eating or drinking restrictions.  Plan to have someone take you home after the procedure.  If you go home right after the procedure, plan to have someone with you for 24 hours. What happens during the procedure?  You will be given one or more of the following: ? A medicine to help you relax (sedative). ? A medicine to numb the area (local anesthetic).  You will be awake during the procedure. You will need to be able to talk with the health care provider during the procedure.  With the help of a type of X-ray (fluoroscopy), the health care provider will insert a radiofrequency needle into the area to be treated.  Next, a wire that carries the radio waves (electrode) will be put through the radiofrequency needle. An electrical pulse will be sent through the electrode to verify the correct nerve. You will feel a tingling sensation, and you may have muscle twitching.  Then, the tissue that is around the needle tip will be heated by an electric current that is passed using the radiofrequency machine. This will numb the nerves.  A bandage (dressing) will be put on the insertion area after the procedure is done. The procedure may vary among health care providers and hospitals. What happens after the procedure?  Your  blood pressure, heart rate, breathing rate, and blood oxygen level will be monitored often until the medicines you were given have worn off.  Return to your normal activities as directed by your health care provider. This information is not intended to replace advice given to you by your health care provider. Make sure you discuss any questions you have with your health care provider. Document Released: 09/26/2010 Document Revised: 07/06/2015 Document Reviewed: 03/07/2014 Elsevier Interactive Patient Education  [employer].

## 2017-11-03 NOTE — Patient Instructions (Signed)
____________________________________________________________________________________________  Preparing for Procedure with Sedation  Instructions: . Oral Intake: Do not eat or drink anything for at least 8 hours prior to your procedure. . Transportation: Public transportation is not allowed. Bring an adult driver. The driver must be physically present in our waiting room before any procedure can be started. . Physical Assistance: Bring an adult physically capable of assisting you, in the event you need help. This adult should keep you company at home for at least 6 hours after the procedure. . Blood Pressure Medicine: Take your blood pressure medicine with a sip of water the morning of the procedure. . Blood thinners: Notify our staff if you are taking any blood thinners. Depending on which one you take, there will be specific instructions on how and when to stop it. . Diabetics on insulin: Notify the staff so that you can be scheduled 1st case in the morning. If your diabetes requires high dose insulin, take only  of your normal insulin dose the morning of the procedure and notify the staff that you have done so. . Preventing infections: Shower with an antibacterial soap the morning of your procedure. . Build-up your immune system: Take 1000 mg of Vitamin C with every meal (3 times a day) the day prior to your procedure. . Antibiotics: Inform the staff if you have a condition or reason that requires you to take antibiotics before dental procedures. . Pregnancy: If you are pregnant, call and cancel the procedure. . Sickness: If you have a cold, fever, or any active infections, call and cancel the procedure. . Arrival: You must be in the facility at least 30 minutes prior to your scheduled procedure. . Children: Do not bring children with you. . Dress appropriately: Bring dark clothing that you would not mind if they get stained. . Valuables: Do not bring any jewelry or valuables.  Procedure  appointments are reserved for interventional treatments only. . No Prescription Refills. . No medication changes will be discussed during procedure appointments. . No disability issues will be discussed.  Reasons to call and reschedule or cancel your procedure: (Following these recommendations will minimize the risk of a serious complication.) . Surgeries: Avoid having procedures within 2 weeks of any surgery. (Avoid for 2 weeks before or after any surgery). . Flu Shots: Avoid having procedures within 2 weeks of a flu shots or . (Avoid for 2 weeks before or after immunizations). . Barium: Avoid having a procedure within 7-10 days after having had a radiological study involving the use of radiological contrast. (Myelograms, Barium swallow or enema study). . Heart attacks: Avoid any elective procedures or surgeries for the initial 6 months after a "Myocardial Infarction" (Heart Attack). . Blood thinners: It is imperative that you stop these medications before procedures. Let us know if you if you take any blood thinner.  . Infection: Avoid procedures during or within two weeks of an infection (including chest colds or gastrointestinal problems). Symptoms associated with infections include: Localized redness, fever, chills, night sweats or profuse sweating, burning sensation when voiding, cough, congestion, stuffiness, runny nose, sore throat, diarrhea, nausea, vomiting, cold or Flu symptoms, recent or current infections. It is specially important if the infection is over the area that we intend to treat. . Heart and lung problems: Symptoms that may suggest an active cardiopulmonary problem include: cough, chest pain, breathing difficulties or shortness of breath, dizziness, ankle swelling, uncontrolled high or unusually low blood pressure, and/or palpitations. If you are experiencing any of these symptoms, cancel   your procedure and contact your primary care physician for an evaluation.  Remember:   Regular Business hours are:  Monday to Thursday 8:00 AM to 4:00 PM  Provider's Schedule: Francisco Naveira, MD:  Procedure days: Tuesday and Thursday 7:30 AM to 4:00 PM  Bilal Lateef, MD:  Procedure days: Monday and Wednesday 7:30 AM to 4:00 PM ____________________________________________________________________________________________  Radiofrequency Lesioning Radiofrequency lesioning is a procedure that is performed to relieve pain. The procedure is often used for back, neck, or arm pain. Radiofrequency lesioning involves the use of a machine that creates radio waves to make heat. During the procedure, the heat is applied to the nerve that carries the pain signal. The heat damages the nerve and interferes with the pain signal. Pain relief usually starts about 2 weeks after the procedure and lasts for 6 months to 1 year. Tell a health care provider about:  Any allergies you have.  All medicines you are taking, including vitamins, herbs, eye drops, creams, and over-the-counter medicines.  Any problems you or family members have had with anesthetic medicines.  Any blood disorders you have.  Any surgeries you have had.  Any medical conditions you have.  Whether you are pregnant or may be pregnant. What are the risks? Generally, this is a safe procedure. However, problems may occur, including:  Pain or soreness at the injection site.  Infection at the injection site.  Damage to nerves or blood vessels.  What happens before the procedure?  Ask your health care provider about: ? Changing or stopping your regular medicines. This is especially important if you are taking diabetes medicines or blood thinners. ? Taking medicines such as aspirin and ibuprofen. These medicines can thin your blood. Do not take these medicines before your procedure if your health care provider instructs you not to.  Follow instructions from your health care provider about eating or drinking  restrictions.  Plan to have someone take you home after the procedure.  If you go home right after the procedure, plan to have someone with you for 24 hours. What happens during the procedure?  You will be given one or more of the following: ? A medicine to help you relax (sedative). ? A medicine to numb the area (local anesthetic).  You will be awake during the procedure. You will need to be able to talk with the health care provider during the procedure.  With the help of a type of X-ray (fluoroscopy), the health care provider will insert a radiofrequency needle into the area to be treated.  Next, a wire that carries the radio waves (electrode) will be put through the radiofrequency needle. An electrical pulse will be sent through the electrode to verify the correct nerve. You will feel a tingling sensation, and you may have muscle twitching.  Then, the tissue that is around the needle tip will be heated by an electric current that is passed using the radiofrequency machine. This will numb the nerves.  A bandage (dressing) will be put on the insertion area after the procedure is done. The procedure may vary among health care providers and hospitals. What happens after the procedure?  Your blood pressure, heart rate, breathing rate, and blood oxygen level will be monitored often until the medicines you were given have worn off.  Return to your normal activities as directed by your health care provider. This information is not intended to replace advice given to you by your health care provider. Make sure you discuss any questions you have   with your health care provider. Document Released: 09/26/2010 Document Revised: 07/06/2015 Document Reviewed: 03/07/2014 Elsevier Interactive Patient Education  2018 [employer]  

## 2017-11-13 ENCOUNTER — Other Ambulatory Visit: Payer: Self-pay

## 2017-11-13 ENCOUNTER — Encounter: Payer: Self-pay | Admitting: Nurse Practitioner

## 2017-11-13 ENCOUNTER — Ambulatory Visit: Payer: BLUE CROSS/BLUE SHIELD | Attending: Nurse Practitioner | Admitting: Nurse Practitioner

## 2017-11-13 VITALS — BP 156/97 | HR 98 | Temp 97.9°F | Resp 18 | Ht 64.0 in | Wt 260.0 lb

## 2017-11-13 DIAGNOSIS — F329 Major depressive disorder, single episode, unspecified: Secondary | ICD-10-CM | POA: Diagnosis not present

## 2017-11-13 DIAGNOSIS — K219 Gastro-esophageal reflux disease without esophagitis: Secondary | ICD-10-CM | POA: Insufficient documentation

## 2017-11-13 DIAGNOSIS — M329 Systemic lupus erythematosus, unspecified: Secondary | ICD-10-CM | POA: Diagnosis not present

## 2017-11-13 DIAGNOSIS — I10 Essential (primary) hypertension: Secondary | ICD-10-CM | POA: Insufficient documentation

## 2017-11-13 DIAGNOSIS — M797 Fibromyalgia: Secondary | ICD-10-CM | POA: Insufficient documentation

## 2017-11-13 DIAGNOSIS — Z9689 Presence of other specified functional implants: Secondary | ICD-10-CM | POA: Diagnosis not present

## 2017-11-13 DIAGNOSIS — K589 Irritable bowel syndrome without diarrhea: Secondary | ICD-10-CM | POA: Insufficient documentation

## 2017-11-13 DIAGNOSIS — M5412 Radiculopathy, cervical region: Secondary | ICD-10-CM | POA: Insufficient documentation

## 2017-11-13 DIAGNOSIS — E559 Vitamin D deficiency, unspecified: Secondary | ICD-10-CM | POA: Insufficient documentation

## 2017-11-13 DIAGNOSIS — F17210 Nicotine dependence, cigarettes, uncomplicated: Secondary | ICD-10-CM | POA: Diagnosis not present

## 2017-11-13 DIAGNOSIS — M25551 Pain in right hip: Secondary | ICD-10-CM | POA: Diagnosis present

## 2017-11-13 DIAGNOSIS — G894 Chronic pain syndrome: Secondary | ICD-10-CM

## 2017-11-13 DIAGNOSIS — M79604 Pain in right leg: Secondary | ICD-10-CM | POA: Diagnosis present

## 2017-11-13 DIAGNOSIS — M4726 Other spondylosis with radiculopathy, lumbar region: Secondary | ICD-10-CM | POA: Diagnosis not present

## 2017-11-13 DIAGNOSIS — Z79891 Long term (current) use of opiate analgesic: Secondary | ICD-10-CM | POA: Diagnosis not present

## 2017-11-13 DIAGNOSIS — F411 Generalized anxiety disorder: Secondary | ICD-10-CM | POA: Insufficient documentation

## 2017-11-13 DIAGNOSIS — Z79899 Other long term (current) drug therapy: Secondary | ICD-10-CM | POA: Diagnosis not present

## 2017-11-13 DIAGNOSIS — M47816 Spondylosis without myelopathy or radiculopathy, lumbar region: Secondary | ICD-10-CM

## 2017-11-13 DIAGNOSIS — M1990 Unspecified osteoarthritis, unspecified site: Secondary | ICD-10-CM | POA: Insufficient documentation

## 2017-11-13 MED ORDER — OXYCODONE HCL 5 MG PO TABS: 5.0000 mg | ORAL_TABLET | Freq: Four times a day (QID) | ORAL | 0 refills | Status: DC | PRN

## 2017-11-13 MED ORDER — OXYCODONE HCL 5 MG PO TABS: 5.0000 mg | ORAL_TABLET | ORAL | 0 refills | Status: DC | PRN

## 2017-11-13 MED ORDER — METHOCARBAMOL 750 MG PO TABS: 750.0000 mg | ORAL_TABLET | Freq: Three times a day (TID) | ORAL | 2 refills | Status: DC | PRN

## 2017-11-13 NOTE — Patient Instructions (Addendum)
____________________________________________________________________________________________  Medication Rules  Applies to: All patients receiving prescriptions (written or electronic).  Pharmacy of record: Pharmacy where electronic prescriptions will be sent. If written prescriptions are taken to a different pharmacy, please inform the nursing staff. The pharmacy listed in the electronic medical record should be the one where you would like electronic prescriptions to be sent.  Prescription refills: Only during scheduled appointments. Applies to both, written and electronic prescriptions.  NOTE: The following applies primarily to controlled substances (Opioid* Pain Medications).   Patient's responsibilities: 1. Pain Pills: Bring all pain pills to every appointment (except for procedure appointments). 2. Pill Bottles: Bring pills in original pharmacy bottle. Always bring newest bottle. Bring bottle, even if empty. 3. Medication refills: You are responsible for knowing and keeping track of what medications you need refilled. The day before your appointment, write a list of all prescriptions that need to be refilled. Bring that list to your appointment and give it to the admitting nurse. Prescriptions will be written only during appointments. If you forget a medication, it will not be "Called in", "Faxed", or "electronically sent". You will need to get another appointment to get these prescribed. 4. Prescription Accuracy: You are responsible for carefully inspecting your prescriptions before leaving our office. Have the discharge nurse carefully go over each prescription with you, before taking them home. Make sure that your name is accurately spelled, that your address is correct. Check the name and dose of your medication to make sure it is accurate. Check the number of pills, and the written instructions to make sure they are clear and accurate. Make sure that you are given enough medication to last  until your next medication refill appointment. 5. Taking Medication: Take medication as prescribed. Never take more pills than instructed. Never take medication more frequently than prescribed. Taking less pills or less frequently is permitted and encouraged, when it comes to controlled substances (written prescriptions).  6. Inform other Doctors: Always inform, all of your healthcare providers, of all the medications you take. 7. Pain Medication from other Providers: You are not allowed to accept any additional pain medication from any other Doctor or Healthcare provider. There are two exceptions to this rule. (see below) In the event that you require additional pain medication, you are responsible for notifying us, as stated below. 8. Medication Agreement: You are responsible for carefully reading and following our Medication Agreement. This must be signed before receiving any prescriptions from our practice. Safely store a copy of your signed Agreement. Violations to the Agreement will result in no further prescriptions. (Additional copies of our Medication Agreement are available upon request.) 9. Laws, Rules, & Regulations: All patients are expected to follow all Federal and State Laws, Statutes, Rules, & Regulations. Ignorance of the Laws does not constitute a valid excuse. The use of any illegal substances is prohibited. 10. Adopted CDC guidelines & recommendations: Target dosing levels will be at or below 60 MME/day. Use of benzodiazepines** is not recommended.  Exceptions: There are only two exceptions to the rule of not receiving pain medications from other Healthcare Providers. 1. Exception #1 (Emergencies): In the event of an emergency (i.e.: accident requiring emergency care), you are allowed to receive additional pain medication. However, you are responsible for: As soon as you are able, call our office [phone], at any time of the day or night, and leave a message stating your name, the  date and nature of the emergency, and the name and dose of the medication   prescribed. In the event that your call is answered by a member of our staff, make sure to document and save the date, time, and the name of the person that took your information.  2. Exception #2 (Planned Surgery): In the event that you are scheduled by another doctor or dentist to have any type of surgery or procedure, you are allowed (for a period no longer than 30 days), to receive additional pain medication, for the acute post-op pain. However, in this case, you are responsible for picking up a copy of our "Post-op Pain Management for Surgeons" handout, and giving it to your surgeon or dentist. This document is available at our office, and does not require an appointment to obtain it. Simply go to our office during business hours (Monday-Thursday from 8:00 AM to 4:00 PM) (Friday 8:00 AM to 12:00 Noon) or if you have a scheduled appointment with [place], prior to your surgery, and ask for it by name. In addition, you will need to provide [place] with your name, name of your surgeon, type of surgery, and date of procedure or surgery.  *Opioid medications include: morphine, codeine, oxycodone, oxymorphone, hydrocodone, hydromorphone, meperidine, tramadol, tapentadol, buprenorphine, fentanyl, methadone. **Benzodiazepine medications include: diazepam (Valium), alprazolam (Xanax), clonazepam (Klonopine), lorazepam (Ativan), clorazepate (Tranxene), chlordiazepoxide (Librium), estazolam (Prosom), oxazepam (Serax), temazepam (Restoril), triazolam (Halcion) (Last updated: 04/10/2017) ____________________________________________________________________________________________   All scripts were e-scribed to your pharmacy of choice.- [facility]

## 2017-11-13 NOTE — Progress Notes (Signed)
Nursing Pain Medication Assessment:  Safety precautions to be maintained throughout the outpatient stay will include: orient to surroundings, keep bed in low position, maintain call bell within reach at all times, provide assistance with transfer out of bed and ambulation.  Medication Inspection Compliance: Pill count conducted under aseptic conditions, in front of the patient. Neither the pills nor the bottle was removed from the patient's sight at any time. Once count was completed pills were immediately returned to the patient in their original bottle.  Medication: Oxycodone IR Pill/Patch Count: 48 of 120 pills remain Pill/Patch Appearance: Markings consistent with prescribed medication Bottle Appearance: Standard pharmacy container. Clearly labeled. Filled Date: 08 / 31 / 2019 Last Medication intake:  Today

## 2017-11-13 NOTE — Progress Notes (Signed)
Patient's Name: Mary Cruz  MRN: [id number]  Referring Provider: Emeterio Reeve, DO  DOB: 1975/11/19  PCP: Emeterio Reeve, DO  DOS: 11/13/2017  Note by: Vevelyn Francois NP  Service setting: Ambulatory outpatient  Specialty: Interventional Pain Management  Location: [facility] (AMB) Pain Management Facility    Patient type: Established    Primary Reason(s) for Visit: Encounter for prescription drug management & post-procedure evaluation of chronic illness with mild to moderate exacerbation(Level of risk: moderate) CC: Hip Pain (right) and Leg Pain (right)  HPI  Mary Cruz is a 42 y.o. year old, female patient, who comes today for a post-procedure evaluation and medication management. Mary Cruz has GERD; Irritable bowel syndrome; KIDNEY STONES; Essential hypertension, benign; Chronic pain syndrome; Generalized anxiety disorder; Obesity; Long term current use of opiate analgesic; Long term prescription opiate use; Opiate use (30 MME/Day); Opiate dependence ([place]); Encounter for therapeutic drug level monitoring; Musculoskeletal pain; Muscle cramping; Osteoarthrosis; Vitamin D insufficiency; Presence of functional implant ([employer] Lumbar spinal cord stimulator implant); Encounter for management of implanted device; Chronic low back pain (Bilateral) (R>L); Chronic lumbar radicular pain (Right L5 dermatome; Left S1 Dermatome) (Bilateral) (R>L); Chronic hip pain (Location of Tertiary source of pain) (Bilateral) (R>L); Chronic neck pain (Location of Primary Source of Pain) (Bilateral) (L>R); Chronic upper extremity pain (Location of Secondary source of pain) (Bilateral) (L>R); Morbid obesity ([place]); Nicotine dependence; Chronic cervical radicular pain (C7 Dermatome) (Bilateral) (L>R); Chronic lower extremity pain (Bilateral) (R>L); Right knee pain; Anxiety and depression; Implant pain at battery site (right buttocks area); Lumbar spondylosis; Lumbar facet syndrome (Bilateral) (R>L); Positive ANA (antinuclear  antibody); Spinal cord stimulator status; Battery end of life of spinal cord stimulator; Neurogenic pain; Fibromyalgia; Family history of thyroid disease; and Pain in right knee on their problem list. Her primarily concern today is the Hip Pain (right) and Leg Pain (right)  Pain Assessment: Location: Right Hip Radiating: right leg Onset: More than a month ago Duration: Chronic pain Quality: Sharp, Constant, Stabbing Severity: 5 /10 (subjective, self-reported pain score)  Note: Reported level is compatible with observation.                          Effect on ADL:   Timing: Constant Modifying factors: procedures, medications BP: (!) 156/97  HR: 98  Mary Cruz was last seen on 11/03/2017 for a procedure. During today's appointment we reviewed Mary Cruz post-procedure results, as well as her outpatient medication regimen.  Mary Cruz admits that Mary Cruz is doing okay.  Mary Cruz is scheduled to have right-sided lumbar facet radiofrequency ablation.  Mary Cruz admits that Mary Cruz has moved however will continue with care here.  Mary Cruz denies any side effects of her medications or any concerns with her treatment regimen.  Further details on both, my assessment(s), as well as the proposed treatment plan, please see below.  Controlled Substance Pharmacotherapy Assessment REMS (Risk Evaluation and Mitigation Strategy)  Analgesic:Oxycodone IR 5 mg every 6 hours when necessary for pain. (20 mg/day) MME/day:30 mg/day.   Hart Rochester, RN  11/13/2017 11:07 AM  Sign at close encounter Nursing Pain Medication Assessment:  Safety precautions to be maintained throughout the outpatient stay will include: orient to surroundings, keep bed in low position, maintain call bell within reach at all times, provide assistance with transfer out of bed and ambulation.  Medication Inspection Compliance: Pill count conducted under aseptic conditions, in front of the patient. Neither the pills nor the bottle was removed from the  patient's sight  at any time. Once count was completed pills were immediately returned to the patient in their original bottle.  Medication: Oxycodone IR Pill/Patch Count: 48 of 120 pills remain Pill/Patch Appearance: Markings consistent with prescribed medication Bottle Appearance: Standard pharmacy container. Clearly labeled. Filled Date: 08 / 31 / 2019 Last Medication intake:  Today   Pharmacokinetics: Liberation and absorption (onset of action): WNL Distribution (time to peak effect): WNL Metabolism and excretion (duration of action): WNL         Pharmacodynamics: Desired effects: Analgesia: Mary Cruz reports >50% benefit. Functional ability: Patient reports that medication allows her to accomplish basic ADLs Clinically meaningful improvement in function (CMIF): Sustained CMIF goals met Perceived effectiveness: Described as relatively effective, allowing for increase in activities of daily living (ADL) Undesirable effects: Side-effects or Adverse reactions: None reported Monitoring: [place] PMP: Online review of the past 82-monthperiod conducted. Compliant with practice rules and regulations Last UDS on record: Summary  Date Value Ref Range Status  04/28/2017 FINAL  Final    Comment:    ==================================================================== TOXASSURE SELECT 13 (MW) ==================================================================== Test                             Result       Flag       Units Drug Present and Declared for Prescription Verification   Oxycodone                      3179         EXPECTED   ng/mg creat   Oxymorphone                    960          EXPECTED   ng/mg creat   Noroxycodone                   3191         EXPECTED   ng/mg creat   Noroxymorphone                 287          EXPECTED   ng/mg creat    Sources of oxycodone are scheduled prescription medications.    Oxymorphone, noroxycodone, and noroxymorphone are expected    metabolites of  oxycodone. Oxymorphone is also available as a    scheduled prescription medication. ==================================================================== Test                      Result    Flag   Units      Ref Range   Creatinine              172              mg/dL      >=20 ==================================================================== Declared Medications:  The flagging and interpretation on this report are based on the  following declared medications.  Unexpected results may arise from  inaccuracies in the declared medications.  **Note: The testing scope of this panel includes these medications:  Oxycodone  **Note: The testing scope of this panel does not include following  reported medications:  Albuterol  Amitriptyline  Duloxetine  Famotidine  Ibuprofen  Methocarbamol  Omeprazole  Pregabalin  Vitamin D3 ==================================================================== For clinical consultation, please call ([phone] ====================================================================    UDS interpretation: Compliant          Medication Assessment Form: Reviewed.  Patient indicates being compliant with therapy Treatment compliance: Compliant Risk Assessment Profile: Aberrant behavior: See prior evaluations. None observed or detected today Comorbid factors increasing risk of overdose: See prior notes. No additional risks detected today Opioid risk tool (ORT) (Total Score):   Personal History of Substance Abuse (SUD-Substance use disorder):  Alcohol:    Illegal Drugs:    Rx Drugs:    ORT Risk Level calculation:   Risk of substance use disorder (SUD): Low  ORT Scoring interpretation table:  Score <3 = Low Risk for SUD  Score between 4-7 = Moderate Risk for SUD  Score >8 = High Risk for Opioid Abuse   Risk Mitigation Strategies:  Patient Counseling: Covered Patient-Prescriber Agreement (PPA): Present and active  Notification to other healthcare  providers: Done  Pharmacologic Plan: No change in therapy, at this time.             Laboratory Chemistry  Inflammation Markers (CRP: Acute Phase) (ESR: Chronic Phase) Lab Results  Component Value Date   CRP 1.0 (H) 04/05/2015   ESRSEDRATE 35 (H) 04/05/2015   LATICACIDVEN 1.0 10/10/2013                         Rheumatology Markers Lab Results  Component Value Date   ANA POS (A) 09/11/2015                        Renal Function Markers Lab Results  Component Value Date   BUN 16 10/24/2016   CREATININE 1.01 55/20/8022   BCR NOT APPLICABLE 33/61/2244   GFRAA 81 10/24/2016   GFRNONAA 70 10/24/2016                             Hepatic Function Markers Lab Results  Component Value Date   AST 18 10/24/2016   ALT 16 10/24/2016   ALBUMIN 3.8 07/22/2016   ALKPHOS 96 07/22/2016                        Electrolytes Lab Results  Component Value Date   NA 138 10/24/2016   K 4.0 10/24/2016   CL 103 10/24/2016   CALCIUM 9.4 10/24/2016   MG 2.0 04/05/2015                        Neuropathy Markers No results found for: VITAMINB12, FOLATE, HGBA1C, HIV                      CNS Tests No results found for: COLORCSF, APPEARCSF, RBCCOUNTCSF, WBCCSF, POLYSCSF, LYMPHSCSF, EOSCSF, PROTEINCSF, GLUCCSF, JCVIRUS, CSFOLI, IGGCSF                      Bone Pathology Markers Lab Results  Component Value Date   VD25OH 25 (L) 10/24/2016                         Coagulation Parameters Lab Results  Component Value Date   INR 1.05 06/21/2016   LABPROT 13.8 06/21/2016   APTT 29 06/21/2016   PLT 285 10/24/2016                        Cardiovascular Markers Lab Results  Component Value Date   HGB 11.8 10/24/2016   HCT 35.6 10/24/2016  CA Markers No results found for: CEA, CA125, LABCA2                      Note: Lab results reviewed.  Recent Diagnostic Imaging Results  DG C-Arm 1-60 Min-No Report Fluoroscopy was utilized by the requesting physician.  No  radiographic  interpretation.   Complexity Note: Imaging results reviewed. Results shared with Ms. Hausner, using Layman's terms.                         Meds   Current Outpatient Medications:  .  albuterol (PROVENTIL HFA;VENTOLIN HFA) 108 (90 Base) MCG/ACT inhaler, Inhale 1-2 puffs into the lungs every 6 (six) hours as needed for wheezing or shortness of breath., Disp: 1 Inhaler, Rfl: 0 .  amitriptyline (ELAVIL) 100 MG tablet, TAKE 1 TABLET (100 MG TOTAL) BY MOUTH AT BEDTIME., Disp: 90 tablet, Rfl: 2 .  Cholecalciferol (VITAMIN D3) 2000 units TABS, Take 1 tablet by mouth daily., Disp: , Rfl:  .  clonazePAM (KLONOPIN) 0.5 MG tablet, Take 0.5-1 tablets (0.25-0.5 mg total) by mouth 2 (two) times daily as needed for anxiety., Disp: 30 tablet, Rfl: 0 .  Diclofenac Sodium (PENNSAID) 2 % SOLN, [place] 20 mg/gram/actuation (2 %) topical soln in metered-dose pump  APPLY 2 (TWO) PUMPS TOPICALLY TO AFFECTED AREA(S) ON KNEES TWICE A DAY, Disp: , Rfl:  .  DULoxetine (CYMBALTA) 60 MG capsule, Take 1 capsule (60 mg total) by mouth 2 (two) times daily., Disp: 180 capsule, Rfl: 3 .  Ibuprofen-Famotidine 800-26.6 MG TABS, Take by mouth 3 (three) times daily., Disp: , Rfl:  .  [START ON 11/21/2017] methocarbamol (ROBAXIN) 750 MG tablet, Take 1 tablet (750 mg total) by mouth every 8 (eight) hours as needed for muscle spasms., Disp: 90 tablet, Rfl: 2 .  Naproxen-Esomeprazole (VIMOVO) 500-20 MG TBEC, Take by mouth., Disp: , Rfl:  .  omeprazole (PRILOSEC) 40 MG capsule, Take 1 capsule (40 mg total) by mouth daily., Disp: 90 capsule, Rfl: 3 .  [START ON 02/11/2018] oxyCODONE (OXY IR/ROXICODONE) 5 MG immediate release tablet, Take 1 tablet (5 mg total) by mouth every 6 (six) hours as needed for severe pain., Disp: 120 tablet, Rfl: 0 .  phenazopyridine (PYRIDIUM) 200 MG tablet, phenazopyridine 200 mg tablet, Disp: , Rfl:  .  predniSONE (RAYOS) 5 MG TBEC, Take by mouth., Disp: , Rfl:  .  pregabalin (LYRICA) 150 MG  capsule, Take 1 capsule (150 mg total) by mouth every 8 (eight) hours., Disp: 90 capsule, Rfl: 1 .  QUEtiapine (SEROQUEL) 25 MG tablet, quetiapine 25 mg tablet, Disp: , Rfl:  .  [START ON 01/12/2018] oxyCODONE (OXY IR/ROXICODONE) 5 MG immediate release tablet, Take 1 tablet (5 mg total) by mouth every 4 (four) hours as needed for severe pain., Disp: 120 tablet, Rfl: 0 .  [START ON 12/13/2017] oxyCODONE (OXY IR/ROXICODONE) 5 MG immediate release tablet, Take 1 tablet (5 mg total) by mouth every 6 (six) hours as needed for severe pain., Disp: 120 tablet, Rfl: 0  ROS  Constitutional: Denies any fever or chills Gastrointestinal: No reported hemesis, hematochezia, vomiting, or acute GI distress Musculoskeletal: Denies any acute onset joint swelling, redness, loss of ROM, or weakness Neurological: No reported episodes of acute onset apraxia, aphasia, dysarthria, agnosia, amnesia, paralysis, loss of coordination, or loss of consciousness  Allergies  Ms. Hargrove is allergic to keflex [cephalexin]; amoxicillin; celebrex [celecoxib]; pentazocine lactate; bactrim [sulfamethoxazole-trimethoprim]; doxycycline; erythromycin; hydromorphone hcl; latex; and nabumetone.  [place]  Drug: Ms. Apsey  reports that Mary Cruz does not use drugs. Alcohol:  reports that Mary Cruz does not drink alcohol. Tobacco:  reports that Mary Cruz has been smoking cigarettes. Mary Cruz has never used smokeless tobacco. Medical:  has a past medical history of Abscess of axillary fold (10/10/2013), Acute bronchitis with asthma with acute exacerbation (01/23/2015), Fibromyalgia, Hypertension, Lupus ([place]), Osteoarthritis, Pain, and Sepsis affecting skin (10/10/2013). Surgical: Ms. Hellums  has a past surgical history that includes spinal cord stimulator; Occipital nerve stimulator insertion; Cholecystectomy (2000); Knee surgery; Shoulder surgery; Tubal ligation (2000); Endometrial ablation; Spinal cord stimulator implant (2008); Knee surgery (Right, 02-2015); Breast  surgery; and Spinal cord stimulator insertion (N/A, 06/21/2016). Family: family history includes Asthma in her mother; Depression in her mother; Fibromyalgia in her unknown relative; Stroke in her unknown relative; Thyroid disease in her unknown relative.  Constitutional Exam  General appearance: alert, cooperative, in no distress, moderately obese and morbidly obese Vitals:   11/13/17 1108  BP: (!) 156/97  Pulse: 98  Resp: 18  Temp: 97.9 F (36.6 C)  TempSrc: Oral  SpO2: 98%  Weight: 260 lb (117.9 kg)  Height: 5' 4"  (1.626 m)  Psych/Mental status: Alert, oriented x 3 (person, place, & time)       Eyes: PERLA Respiratory: No evidence of acute respiratory distress  Lumbar Spine Area Exam  Skin & Axial Inspection: No masses, redness, or swelling Alignment: Symmetrical Functional ROM: Unrestricted ROM       Stability: No instability detected Muscle Tone/Strength: Functionally intact. No obvious neuro-muscular anomalies detected. Sensory (Neurological): Unimpaired Palpation: Tender         Gait & Posture Assessment  Ambulation: Unassisted Gait: Relatively normal for age and body habitus Posture: WNL   Lower Extremity Exam    Side: Right lower extremity  Side: Left lower extremity  Stability: No instability observed          Stability: No instability observed          Skin & Extremity Inspection: Skin color, temperature, and hair growth are WNL. No peripheral edema or cyanosis. No masses, redness, swelling, asymmetry, or associated skin lesions. No contractures.  Skin & Extremity Inspection: Skin color, temperature, and hair growth are WNL. No peripheral edema or cyanosis. No masses, redness, swelling, asymmetry, or associated skin lesions. No contractures.  Functional ROM: Unrestricted ROM                  Functional ROM: Unrestricted ROM                  Muscle Tone/Strength: Functionally intact. No obvious neuro-muscular anomalies detected.  Muscle Tone/Strength: Functionally  intact. No obvious neuro-muscular anomalies detected.  Sensory (Neurological): Unimpaired  Sensory (Neurological): Unimpaired  Palpation: No palpable anomalies  Palpation: No palpable anomalies   Assessment  Primary Diagnosis & Pertinent Problem List: The primary encounter diagnosis was Lumbar spondylosis. A diagnosis of Chronic pain syndrome was also pertinent to this visit.  Status Diagnosis  Controlled Controlled Controlled 1. Lumbar spondylosis   2. Chronic pain syndrome     Problems updated and reviewed during this visit: No problems updated. Plan of Care  Pharmacotherapy (Medications Ordered): Meds ordered this encounter  Medications  . methocarbamol (ROBAXIN) 750 MG tablet    Sig: Take 1 tablet (750 mg total) by mouth every 8 (eight) hours as needed for muscle spasms.    Dispense:  90 tablet    Refill:  2    Do not place this medication, or  any other prescription from our practice, on "Automatic Refill". Patient may have prescription filled one day early if pharmacy is closed on scheduled refill date.    Order Specific Question:   Supervising Provider    Answer:   Milinda Pointer [phone]  . oxyCODONE (OXY IR/ROXICODONE) 5 MG immediate release tablet    Sig: Take 1 tablet (5 mg total) by mouth every 6 (six) hours as needed for severe pain.    Dispense:  120 tablet    Refill:  0    Do not add this medication to the electronic "Automatic Refill" notification system. Patient may have prescription filled one day early if pharmacy is closed on scheduled refill date.    Order Specific Question:   Supervising Provider    Answer:   Milinda Pointer [phone]  . oxyCODONE (OXY IR/ROXICODONE) 5 MG immediate release tablet    Sig: Take 1 tablet (5 mg total) by mouth every 4 (four) hours as needed for severe pain.    Dispense:  120 tablet    Refill:  0    Do not add this medication to the electronic "Automatic Refill" notification system. Patient may have prescription filled one  day early if pharmacy is closed on scheduled refill date.    Order Specific Question:   Supervising Provider    Answer:   Milinda Pointer [phone]  . oxyCODONE (OXY IR/ROXICODONE) 5 MG immediate release tablet    Sig: Take 1 tablet (5 mg total) by mouth every 6 (six) hours as needed for severe pain.    Dispense:  120 tablet    Refill:  0    Do not add this medication to the electronic "Automatic Refill" notification system. Patient may have prescription filled one day early if pharmacy is closed on scheduled refill date.    Order Specific Question:   Supervising Provider    Answer:   Milinda Pointer [[id number]]   New Prescriptions   No medications on file   Medications administered today: Emelia Loron had no medications administered during this visit. Lab-work, procedure(s), and/or referral(s): No orders of the defined types were placed in this encounter.  Imaging and/or referral(s): None  Interventional therapies: Planned, scheduled, and/or pending:  Palliative  right-sided lumbar facet radiofrequency.    Considering:  Palliative Bilateral lumbar facet radiofrequency.    Palliative PRN treatment(s):  Palliative left-sided C7-T1 cervical epidural steroid injection    Provider-requested follow-up: Return in about 3 months (around 02/18/2018) for MedMgmt, Appointment As Scheduled.  Future Appointments  Date Time Provider [place]  12/01/2017  8:00 AM Gillis Santa, MD [facility] None  02/12/2018 11:00 AM Vevelyn Francois, NP [facility] None   Primary Care Physician: Emeterio Reeve, DO Location: [facility] Outpatient Pain Management Facility Note by: Vevelyn Francois NP Date: 11/13/2017; Time: 3:43 PM  Pain Score Disclaimer: We use the NRS-11 scale. This is a self-reported, subjective measurement of pain severity with only modest accuracy. It is used primarily to identify changes within a particular patient. It must be understood that outpatient pain scales  are significantly less accurate that those used for research, where they can be applied under ideal controlled circumstances with minimal exposure to variables. In reality, the score is likely to be a combination of pain intensity and pain affect, where pain affect describes the degree of emotional arousal or changes in action readiness caused by the sensory experience of pain. Factors such as social and work situation, setting, emotional state, anxiety levels, expectation, and prior pain experience  may influence pain perception and show large inter-individual differences that may also be affected by time variables.  Patient instructions provided during this appointment: Patient Instructions  ____________________________________________________________________________________________  Medication Rules  Applies to: All patients receiving prescriptions (written or electronic).  Pharmacy of record: Pharmacy where electronic prescriptions will be sent. If written prescriptions are taken to a different pharmacy, please inform the nursing staff. The pharmacy listed in the electronic medical record should be the one where you would like electronic prescriptions to be sent.  Prescription refills: Only during scheduled appointments. Applies to both, written and electronic prescriptions.  NOTE: The following applies primarily to controlled substances (Opioid* Pain Medications).   Patient's responsibilities: 1. Pain Pills: Bring all pain pills to every appointment (except for procedure appointments). 2. Pill Bottles: Bring pills in original pharmacy bottle. Always bring newest bottle. Bring bottle, even if empty. 3. Medication refills: You are responsible for knowing and keeping track of what medications you need refilled. The day before your appointment, write a list of all prescriptions that need to be refilled. Bring that list to your appointment and give it to the admitting nurse. Prescriptions will be  written only during appointments. If you forget a medication, it will not be "Called in", "Faxed", or "electronically sent". You will need to get another appointment to get these prescribed. 4. Prescription Accuracy: You are responsible for carefully inspecting your prescriptions before leaving our office. Have the discharge nurse carefully go over each prescription with you, before taking them home. Make sure that your name is accurately spelled, that your address is correct. Check the name and dose of your medication to make sure it is accurate. Check the number of pills, and the written instructions to make sure they are clear and accurate. Make sure that you are given enough medication to last until your next medication refill appointment. 5. Taking Medication: Take medication as prescribed. Never take more pills than instructed. Never take medication more frequently than prescribed. Taking less pills or less frequently is permitted and encouraged, when it comes to controlled substances (written prescriptions).  6. Inform other Doctors: Always inform, all of your healthcare providers, of all the medications you take. 7. Pain Medication from other Providers: You are not allowed to accept any additional pain medication from any other Doctor or Healthcare provider. There are two exceptions to this rule. (see below) In the event that you require additional pain medication, you are responsible for notifying us, as stated below. 8. Medication Agreement: You are responsible for carefully reading and following our Medication Agreement. This must be signed before receiving any prescriptions from our practice. Safely store a copy of your signed Agreement. Violations to the Agreement will result in no further prescriptions. (Additional copies of our Medication Agreement are available upon request.) 9. Laws, Rules, & Regulations: All patients are expected to follow all Federal and [employer], Rules, [employer] Ignorance of the Laws does not constitute a valid excuse. The use of any illegal substances is prohibited. 10. Adopted CDC guidelines & recommendations: Target dosing levels will be at or below 60 MME/day. Use of benzodiazepines** is not recommended.  Exceptions: There are only two exceptions to the rule of not receiving pain medications from other Healthcare Providers. 1. Exception #1 (Emergencies): In the event of an emergency (i.e.: accident requiring emergency care), you are allowed to receive additional pain medication. However, you are responsible for: As soon as you are able, call our office [phone], at any time  of the day or night, and leave a message stating your name, the date and nature of the emergency, and the name and dose of the medication prescribed. In the event that your call is answered by a member of our staff, make sure to document and save the date, time, and the name of the person that took your information.  2. Exception #2 (Planned Surgery): In the event that you are scheduled by another doctor or dentist to have any type of surgery or procedure, you are allowed (for a period no longer than 30 days), to receive additional pain medication, for the acute post-op pain. However, in this case, you are responsible for picking up a copy of our "Post-op Pain Management for Surgeons" handout, and giving it to your surgeon or dentist. This document is available at our office, and does not require an appointment to obtain it. Simply go to our office during business hours (Monday-Thursday from 8:00 AM to 4:00 PM) (Friday 8:00 AM to 12:00 Noon) or if you have a scheduled appointment with [place], prior to your surgery, and ask for it by name. In addition, you will need to provide [place] with your name, name of your surgeon, type of surgery, and date of procedure or surgery.  *Opioid medications include: morphine, codeine, oxycodone, oxymorphone, hydrocodone, hydromorphone, meperidine,  tramadol, tapentadol, buprenorphine, fentanyl, methadone. **Benzodiazepine medications include: diazepam (Valium), alprazolam (Xanax), clonazepam (Klonopine), lorazepam (Ativan), clorazepate (Tranxene), chlordiazepoxide (Librium), estazolam (Prosom), oxazepam (Serax), temazepam (Restoril), triazolam (Halcion) (Last updated: 04/10/2017) ____________________________________________________________________________________________   All scripts were e-scribed to your pharmacy of choice.- [facility]

## 2017-12-01 ENCOUNTER — Ambulatory Visit (HOSPITAL_BASED_OUTPATIENT_CLINIC_OR_DEPARTMENT_OTHER): Payer: BLUE CROSS/BLUE SHIELD | Admitting: Student in an Organized Health Care Education/Training Program

## 2017-12-01 ENCOUNTER — Encounter: Payer: Self-pay | Admitting: Student in an Organized Health Care Education/Training Program

## 2017-12-01 ENCOUNTER — Ambulatory Visit
Admission: RE | Admit: 2017-12-01 | Discharge: 2017-12-01 | Disposition: A | Discharge status: Home / Self Care | Payer: BLUE CROSS/BLUE SHIELD | Source: Ambulatory Visit | Attending: Student in an Organized Health Care Education/Training Program | Admitting: Student in an Organized Health Care Education/Training Program

## 2017-12-01 VITALS — BP 134/77 | HR 81 | Temp 97.2°F | Resp 16 | Ht 64.0 in | Wt 260.0 lb

## 2017-12-01 DIAGNOSIS — M47816 Spondylosis without myelopathy or radiculopathy, lumbar region: Secondary | ICD-10-CM

## 2017-12-01 DIAGNOSIS — M79604 Pain in right leg: Secondary | ICD-10-CM | POA: Diagnosis present

## 2017-12-01 DIAGNOSIS — M25551 Pain in right hip: Secondary | ICD-10-CM | POA: Diagnosis present

## 2017-12-01 DIAGNOSIS — M545 Low back pain: Secondary | ICD-10-CM | POA: Diagnosis present

## 2017-12-01 DIAGNOSIS — Z9689 Presence of other specified functional implants: Secondary | ICD-10-CM | POA: Insufficient documentation

## 2017-12-01 MED ORDER — DEXAMETHASONE SODIUM PHOSPHATE 10 MG/ML IJ SOLN
10.0000 mg | Freq: Once | INTRAMUSCULAR | Status: AC
  Administered 2017-12-01: 10 mg
  Filled 2017-12-01: qty 1

## 2017-12-01 MED ORDER — LACTATED RINGERS IV SOLN
1000.0000 mL | Freq: Once | INTRAVENOUS | Status: AC
  Administered 2017-12-01: 1000 mL via INTRAVENOUS

## 2017-12-01 MED ORDER — ROPIVACAINE HCL 2 MG/ML IJ SOLN
10.0000 mL | Freq: Once | INTRAMUSCULAR | Status: AC
  Administered 2017-12-01: 10 mL
  Filled 2017-12-01: qty 10

## 2017-12-01 MED ORDER — LIDOCAINE HCL 2 % IJ SOLN
20.0000 mL | Freq: Once | INTRAMUSCULAR | Status: AC
  Administered 2017-12-01: 400 mg
  Filled 2017-12-01: qty 20

## 2017-12-01 MED ORDER — FENTANYL CITRATE (PF) 100 MCG/2ML IJ SOLN
25.0000 ug | INTRAMUSCULAR | Status: DC | PRN
  Administered 2017-12-01: 100 ug via INTRAVENOUS
  Filled 2017-12-01: qty 2

## 2017-12-01 MED ORDER — MIDAZOLAM HCL 5 MG/5ML IJ SOLN
1.0000 mg | INTRAMUSCULAR | Status: DC | PRN
  Administered 2017-12-01: 1 mg via INTRAVENOUS
  Filled 2017-12-01: qty 5

## 2017-12-01 NOTE — Progress Notes (Signed)
Safety precautions to be maintained throughout the outpatient stay will include: orient to surroundings, keep bed in low position, maintain call bell within reach at all times, provide assistance with transfer out of bed and ambulation.  

## 2017-12-01 NOTE — Progress Notes (Signed)
Patient's Name: Mary Cruz  MRN: [id number]  Referring Provider: Emeterio Reeve, DO  DOB: 12-16-1975  PCP: Emeterio Reeve, DO  DOS: 12/01/2017  Note by: Gillis Santa, MD  Service setting: Ambulatory outpatient  Specialty: Interventional Pain Management  Patient type: Established  Location: [facility] (AMB) Pain Management Facility  Visit type: Interventional Procedure   Primary Reason for Visit: Interventional Pain Management Treatment. CC: Back Pain (right lower back); Hip Pain (right); and Leg Pain (right)  Procedure:          Anesthesia, Analgesia, Anxiolysis:  Type: Thermal Lumbar Facet, Medial Branch Radiofrequency Ablation/Neurotomy Level:L3, L4, L5, & S1 Medial Branch Level(s). These levels will denervate the L3-4, L4-5, and the L5-S1 lumbar facet joints. Primary Purpose: Therapeutic Region: Posterolateral Lumbosacral Spine Laterality: Right  Type: Moderate (Conscious) Sedation combined with Local Anesthesia Indication(s): Analgesia and Anxiety Route: Intravenous (IV) IV Access: Secured Sedation: Meaningful verbal contact was maintained at all times during the procedure  Local Anesthetic: Lidocaine 1-2%   Indications: 1. Lumbar spondylosis   2. Lumbar facet syndrome (Bilateral)     Mary Cruz has been dealing with the above chronic pain for longer than three months and has either failed to respond, was unable to tolerate, or simply did not get enough benefit from other more conservative therapies including, but not limited to: 1. Over-the-counter medications 2. Anti-inflammatory medications 3. Muscle relaxants 4. Membrane stabilizers 5. Opioids 6. Physical therapy 7. Modalities (Heat, ice, etc.) 8. Invasive techniques such as nerve blocks. Mary Cruz has attained more than 50% relief of the pain from a series of diagnostic injections conducted in separate occasions.  Pain Score: Pre-procedure: 4 /10 Post-procedure: 0-No pain/10  Pre-op Assessment:  Ms.  Pentland is a 42 y.o. (year old), female patient, seen today for interventional treatment. She  has a past surgical history that includes spinal cord stimulator; Occipital nerve stimulator insertion; Cholecystectomy (2000); Knee surgery; Shoulder surgery; Tubal ligation (2000); Endometrial ablation; Spinal cord stimulator implant (2008); Knee surgery (Right, 02-2015); Breast surgery; and Spinal cord stimulator insertion (N/A, 06/21/2016). Mary Cruz has a current medication list which includes the following prescription(s): albuterol, amitriptyline, vitamin d3, clonazepam, diclofenac sodium, duloxetine, ibuprofen-famotidine, methocarbamol, naproxen-esomeprazole, omeprazole, oxycodone, oxycodone, oxycodone, phenazopyridine, prednisone, pregabalin, and quetiapine, and the following Facility-Administered Medications: fentanyl and midazolam. Her primarily concern today is the Back Pain (right lower back); Hip Pain (right); and Leg Pain (right)  Initial Vital Signs:  Pulse/HCG Rate: 81ECG Heart Rate: 91 Temp: 98.1 F (36.7 C) Resp: 16 BP: (!) 141/89 SpO2: 99 %  BMI: Estimated body mass index is 44.63 kg/m as calculated from the following:   Height as of this encounter: 5\' 4"  (1.626 m).   Weight as of this encounter: 260 lb (117.9 kg).  Risk Assessment: Allergies: Reviewed. She is allergic to keflex [cephalexin]; amoxicillin; celebrex [celecoxib]; pentazocine lactate; bactrim [sulfamethoxazole-trimethoprim]; doxycycline; erythromycin; hydromorphone hcl; latex; and nabumetone.  Allergy Precautions: None required Coagulopathies: Reviewed. None identified.  Blood-thinner therapy: None at this time Active Infection(s): Reviewed. None identified. Mary Cruz is afebrile  Site Confirmation: [name] was asked to confirm the procedure and laterality before marking the site Procedure checklist: Completed Consent: Before the procedure and under the influence of no sedative(s), amnesic(s), or anxiolytics,  the patient was informed of the treatment options, risks and possible complications. To fulfill our ethical and legal obligations, as recommended by the American Medical Association's Code of Ethics, I have informed the patient of my clinical impression; the nature and purpose of the treatment  or procedure; the risks, benefits, and possible complications of the intervention; the alternatives, including doing nothing; the risk(s) and benefit(s) of the alternative treatment(s) or procedure(s); and the risk(s) and benefit(s) of doing nothing. The patient was provided information about the general risks and possible complications associated with the procedure. These may include, but are not limited to: failure to achieve desired goals, infection, bleeding, organ or nerve damage, allergic reactions, paralysis, and death. In addition, the patient was informed of those risks and complications associated to Spine-related procedures, such as failure to decrease pain; infection (i.e.: Meningitis, epidural or intraspinal abscess); bleeding (i.e.: epidural hematoma, subarachnoid hemorrhage, or any other type of intraspinal or peri-dural bleeding); organ or nerve damage (i.e.: Any type of peripheral nerve, nerve root, or spinal cord injury) with subsequent damage to sensory, motor, and/or autonomic systems, resulting in permanent pain, numbness, and/or weakness of one or several areas of the body; allergic reactions; (i.e.: anaphylactic reaction); and/or death. Furthermore, the patient was informed of those risks and complications associated with the medications. These include, but are not limited to: allergic reactions (i.e.: anaphylactic or anaphylactoid reaction(s)); adrenal axis suppression; blood sugar elevation that in diabetics may result in ketoacidosis or comma; water retention that in patients with history of congestive heart failure may result in shortness of breath, pulmonary edema, and decompensation with  resultant heart failure; weight gain; swelling or edema; medication-induced neural toxicity; particulate matter embolism and blood vessel occlusion with resultant organ, and/or nervous system infarction; and/or aseptic necrosis of one or more joints. Finally, the patient was informed that Medicine is not an exact science; therefore, there is also the possibility of unforeseen or unpredictable risks and/or possible complications that may result in a catastrophic outcome. The patient indicated having understood very clearly. We have given the patient no guarantees and we have made no promises. Enough time was given to the patient to ask questions, all of which were answered to the patient's satisfaction. Ms. Scurlock has indicated that she wanted to continue with the procedure. Attestation: I, the ordering provider, attest that I have discussed with the patient the benefits, risks, side-effects, alternatives, likelihood of achieving goals, and potential problems during recovery for the procedure that I have provided informed consent. Date  Time: 12/01/2017  8:00 AM  Pre-Procedure Preparation:  Monitoring: As per clinic protocol. Respiration, ETCO2, SpO2, BP, heart rate and rhythm monitor placed and checked for adequate function Safety Precautions: Patient was assessed for positional comfort and pressure points before starting the procedure. Time-out: I initiated and conducted the "Time-out" before starting the procedure, as per protocol. The patient was asked to participate by confirming the accuracy of the "Time Out" information. Verification of the correct person, site, and procedure were performed and confirmed by me, the nursing staff, and the patient. "Time-out" conducted as per Joint Commission's Universal Protocol (UP.01.01.01). Time: 0833  Description of Procedure:          Position: Prone Laterality: Right Levels: L3, L4, L5, & S1 Medial Branch Level(s), at the L3-4, L4-5, and the L5-S1 lumbar  facet joints. Area Prepped: Lumbosacral Prepping solution: ChloraPrep (2% chlorhexidine gluconate and 70% isopropyl alcohol) Safety Precautions: Aspiration looking for blood return was conducted prior to all injections. At no point did we inject any substances, as a needle was being advanced. Before injecting, the patient was told to immediately notify me if she was experiencing any new onset of "ringing in the ears, or metallic taste in the mouth". No attempts were made at seeking  any paresthesias. Safe injection practices and needle disposal techniques used. Medications properly checked for expiration dates. SDV (single dose vial) medications used. After the completion of the procedure, all disposable equipment used was discarded in the proper designated medical waste containers. Local Anesthesia: Protocol guidelines were followed. The patient was positioned over the fluoroscopy table. The area was prepped in the usual manner. The time-out was completed. The target area was identified using fluoroscopy. A 12-in long, straight, sterile hemostat was used with fluoroscopic guidance to locate the targets for each level blocked. Once located, the skin was marked with an approved surgical skin marker. Once all sites were marked, the skin (epidermis, dermis, and hypodermis), as well as deeper tissues (fat, connective tissue and muscle) were infiltrated with a small amount of a short-acting local anesthetic, loaded on a 10cc syringe with a 25G, 1.5-in  Needle. An appropriate amount of time was allowed for local anesthetics to take effect before proceeding to the next step. Local Anesthetic: Lidocaine 2.0% The unused portion of the local anesthetic was discarded in the proper designated containers. Technical explanation of process:  Radiofrequency Ablation (RFA)  (Avoided L2 given SCS lead traverses over anatomical region of medial branch nerve block)  L3 Medial Branch Nerve RFA: The target area for the L3  medial branch is at the junction of the postero-lateral aspect of the superior articular process and the superior, posterior, and medial edge of the transverse process of L4. Under fluoroscopic guidance, a Radiofrequency needle was inserted until contact was made with os over the superior postero-lateral aspect of the pedicular shadow (target area). Sensory and motor testing was conducted to properly adjust the position of the needle. Once satisfactory placement of the needle was achieved, the numbing solution was slowly injected after negative aspiration for blood.62mL of the nerve block solution was injected without difficulty or complication. After waiting for at least 3 minutes, the ablation was performed. Once completed, the needle was removed intact. L4 Medial Branch Nerve RFA: The target area for the L4 medial branch is at the junction of the postero-lateral aspect of the superior articular process and the superior, posterior, and medial edge of the transverse process of L5. Under fluoroscopic guidance, a Radiofrequency needle was inserted until contact was made with os over the superior postero-lateral aspect of the pedicular shadow (target area). Sensory and motor testing was conducted to properly adjust the position of the needle. Once satisfactory placement of the needle was achieved, the numbing solution was slowly injected after negative aspiration for blood.98mL of the nerve block solution was injected without difficulty or complication. After waiting for at least 3 minutes, the ablation was performed. Once completed, the needle was removed intact. L5 Medial Branch Nerve RFA: The target area for the L5 medial branch is at the junction of the postero-lateral aspect of the superior articular process of S1 and the superior, posterior, and medial edge of the sacral ala. Under fluoroscopic guidance, a Radiofrequency needle was inserted until contact was made with os over the superior postero-lateral aspect  of the pedicular shadow (target area). Sensory and motor testing was conducted to properly adjust the position of the needle. Once satisfactory placement of the needle was achieved, the numbing solution was slowly injected after negative aspiration for blood. 52mL of the nerve block solution was injected without difficulty or complication. After waiting for at least 3 minutes, the ablation was performed. Once completed, the needle was removed intact. S1 Medial Branch Nerve RFA: The target area for the  S1 medial branch is located inferior to the junction of the S1 superior articular process and the L5 inferior articular process, posterior, inferior, and lateral to the 6 o'clock position of the L5-S1 facet joint, just superior to the S1 posterior foramen. Under fluoroscopic guidance, the Radiofrequency needle was advanced until contact was made with os over the Target area. Sensory and motor testing was conducted to properly adjust the position of the needle. Once satisfactory placement of the needle was achieved, the numbing solution was slowly injected after negative aspiration for blood. 1 mL of the nerve block solution was injected without difficulty or complication. After waiting for at least 3 minutes, the ablation was performed. Once completed, the needle was removed intact. Radiofrequency lesioning (ablation):  Radiofrequency Generator: NeuroTherm [id number] Sensory Stimulation Parameters: 50 Hz was used to locate & identify the nerve, making sure that the needle was positioned such that there was no sensory stimulation below 0.3 V or above 0.7 V. Motor Stimulation Parameters: 2 Hz was used to evaluate the motor component. Care was taken not to lesion any nerves that demonstrated motor stimulation of the lower extremities at an output of less than 2.5 times that of the sensory threshold, or a maximum of 2.0 V. Lesioning Technique Parameters: Standard Radiofrequency settings. (Not bipolar or  pulsed.) Temperature Settings: 80 degrees C Lesioning time: 60 seconds Intra-operative Compliance: Compliant Materials & Medications: Needle(s) (Electrode/Cannula) Type: Teflon-coated, curved tip, Radiofrequency needle(s) Gauge: 22G Length: 15cm Numbing solution: 10 cc solution consisting of 9 cc of 0.2% ropivacaine, 1 cc of Decadron 10 mg/cc.  1 to 1.5 cc injected at each level on the RIGHT.  The unused portion of the solution was discarded in the proper designated containers.  Once the entire procedure was completed, the treated area was cleaned, making sure to leave some of the prepping solution back to take advantage of its long term bactericidal properties.  Illustration of the posterior view of the lumbar spine and the posterior neural structures. Laminae of L2 through S1 are labeled. DPRL5, dorsal primary ramus of L5; DPRS1, dorsal primary ramus of S1; DPR3, dorsal primary ramus of L3; FJ, facet (zygapophyseal) joint L3-L4; I, inferior articular process of L4; LB1, lateral branch of dorsal primary ramus of L1; IAB, inferior articular branches from L3 medial branch (supplies L4-L5 facet joint); IBP, intermediate branch plexus; MB3, medial branch of dorsal primary ramus of L3; NR3, third lumbar nerve root; S, superior articular process of L5; SAB, superior articular branches from L4 (supplies L4-5 facet joint also); TP3, transverse process of L3.  Vitals:   12/01/17 0915 12/01/17 0924 12/01/17 0934 12/01/17 0944  BP: (!) 117/98 (!) 121/98 124/78 134/77  Pulse:      Resp: 13 12 14 16   Temp:  (!) 97.2 F (36.2 C)    TempSrc:      SpO2: 97% 96% 96% 96%  Weight:      Height:        Start Time: 0833 hrs. End Time: 0915 hrs.  Imaging Guidance (Spinal):          Type of Imaging Technique: Fluoroscopy Guidance (Spinal) Indication(s): Assistance in needle guidance and placement for procedures requiring needle placement in or near specific anatomical locations not easily accessible without  such assistance. Exposure Time: Please see nurses notes. Contrast: None used. Fluoroscopic Guidance: I was personally present during the use of fluoroscopy. "Tunnel Vision Technique" used to obtain the best possible view of the target area. Parallax error corrected before commencing the  procedure. "Direction-depth-direction" technique used to introduce the needle under continuous pulsed fluoroscopy. Once target was reached, antero-posterior, oblique, and lateral fluoroscopic projection used confirm needle placement in all planes. Images permanently stored in EMR. Interpretation: No contrast injected. I personally interpreted the imaging intraoperatively. Adequate needle placement confirmed in multiple planes. Permanent images saved into the patient's record.  Antibiotic Prophylaxis:   Anti-infectives (From admission, onward)   None     Indication(s): None identified  Post-operative Assessment:  Post-procedure Vital Signs:  Pulse/HCG Rate: 8178 Temp: (!) 97.2 F (36.2 C) Resp: 16 BP: 134/77 SpO2: 96 %  EBL: None  Complications: No immediate post-treatment complications observed by team, or reported by patient.  Note: The patient tolerated the entire procedure well. A repeat set of vitals were taken after the procedure and the patient was kept under observation following institutional policy, for this type of procedure. Post-procedural neurological assessment was performed, showing return to baseline, prior to discharge. The patient was provided with post-procedure discharge instructions, including a section on how to identify potential problems. Should any problems arise concerning this procedure, the patient was given instructions to immediately contact us, at any time, without hesitation. In any case, we plan to contact the patient by telephone for a follow-up status report regarding this interventional procedure.  Comments:  No additional relevant information. 5 out of 5 strength  bilateral lower extremity: Plantar flexion, dorsiflexion, knee flexion, knee extension.  Plan of Care    Imaging Orders     DG C-Arm 1-60 Min-No Report Procedure Orders    No procedure(s) ordered today    Medications ordered for procedure: Meds ordered this encounter  Medications  . lactated ringers infusion 1,000 mL  . midazolam (VERSED) 5 MG/5ML injection 1-2 mg    Make sure Flumazenil is available in the pyxis when using this medication. If oversedation occurs, administer 0.2 mg IV over 15 sec. If after 45 sec no response, administer 0.2 mg again over 1 min; may repeat at 1 min intervals; not to exceed 4 doses (1 mg)  . fentaNYL (SUBLIMAZE) injection 25-100 mcg    Make sure Narcan is available in the pyxis when using this medication. In the event of respiratory depression (RR< 8/min): Titrate NARCAN (naloxone) in increments of 0.1 to 0.2 mg IV at 2-3 minute intervals, until desired degree of reversal.  . ropivacaine (PF) 2 mg/mL (0.2%) (NAROPIN) injection 10 mL  . lidocaine (XYLOCAINE) 2 % (with pres) injection 400 mg  . dexamethasone (DECADRON) injection 10 mg  . dexamethasone (DECADRON) injection 10 mg   Medications administered: We administered lactated ringers, midazolam, fentaNYL, ropivacaine (PF) 2 mg/mL (0.2%), lidocaine, dexamethasone, and dexamethasone.  See the medical record for exact dosing, route, and time of administration.  New Prescriptions   No medications on file   Disposition: Discharge home  Discharge Date & Time: 12/01/2017; 0944 hrs.   Physician-requested Follow-up: Return in about 6 weeks (around 01/12/2018) for Post Procedure Evaluation.  Future Appointments  Date Time Provider [place]  01/13/2018  8:30 AM Gillis Santa, MD [facility] None  02/12/2018 11:00 AM Vevelyn Francois, NP [facility] None   Primary Care Physician: Emeterio Reeve, DO Location: [facility] Outpatient [facility] Management Facility Note by: Gillis Santa, MD Date: 12/01/2017;  Time: 10:32 AM  Disclaimer:  Medicine is not an exact science. The only guarantee in medicine is that nothing is guaranteed. It is important to note that the decision to proceed with this intervention was based on the information collected from the  patient. The Data and conclusions were drawn from the patient's questionnaire, the interview, and the physical examination. Because the information was provided in large part by the patient, it cannot be guaranteed that it has not been purposely or unconsciously manipulated. Every effort has been made to obtain as much relevant data as possible for this evaluation. It is important to note that the conclusions that lead to this procedure are derived in large part from the available data. Always take into account that the treatment will also be dependent on availability of resources and existing treatment guidelines, considered by other Pain Management Practitioners as being common knowledge and practice, at the time of the intervention. For Medico-Legal purposes, it is also important to point out that variation in procedural techniques and pharmacological choices are the acceptable norm. The indications, contraindications, technique, and results of the above procedure should only be interpreted and judged by a Board-Certified Interventional Pain Specialist with extensive familiarity and expertise in the same exact procedure and technique.

## 2017-12-01 NOTE — Patient Instructions (Signed)

## 2017-12-02 ENCOUNTER — Telehealth: Payer: Self-pay

## 2017-12-02 NOTE — Telephone Encounter (Signed)
Post procedure phone call.  Patient states she is doing well.  

## 2018-01-13 ENCOUNTER — Ambulatory Visit: Payer: BLUE CROSS/BLUE SHIELD | Admitting: Student in an Organized Health Care Education/Training Program

## 2018-01-20 ENCOUNTER — Other Ambulatory Visit: Payer: Self-pay | Admitting: Osteopathic Medicine

## 2018-01-20 DIAGNOSIS — F32A Depression, unspecified: Secondary | ICD-10-CM

## 2018-01-20 DIAGNOSIS — F419 Anxiety disorder, unspecified: Principal | ICD-10-CM

## 2018-01-20 DIAGNOSIS — F329 Major depressive disorder, single episode, unspecified: Secondary | ICD-10-CM

## 2018-01-27 ENCOUNTER — Other Ambulatory Visit: Payer: Self-pay

## 2018-01-27 DIAGNOSIS — F419 Anxiety disorder, unspecified: Principal | ICD-10-CM

## 2018-01-27 DIAGNOSIS — F329 Major depressive disorder, single episode, unspecified: Secondary | ICD-10-CM

## 2018-01-27 DIAGNOSIS — F32A Depression, unspecified: Secondary | ICD-10-CM

## 2018-01-27 MED ORDER — DULOXETINE HCL 60 MG PO CPEP: 60.0000 mg | ORAL_CAPSULE | Freq: Two times a day (BID) | ORAL | 0 refills | Status: DC

## 2018-01-28 ENCOUNTER — Other Ambulatory Visit: Payer: Self-pay | Admitting: Osteopathic Medicine

## 2018-01-28 DIAGNOSIS — F419 Anxiety disorder, unspecified: Principal | ICD-10-CM

## 2018-01-28 DIAGNOSIS — F329 Major depressive disorder, single episode, unspecified: Secondary | ICD-10-CM

## 2018-01-28 DIAGNOSIS — F32A Depression, unspecified: Secondary | ICD-10-CM

## 2018-01-28 MED ORDER — DULOXETINE HCL 60 MG PO CPEP: 60.0000 mg | ORAL_CAPSULE | Freq: Two times a day (BID) | ORAL | 0 refills | Status: DC

## 2018-02-12 ENCOUNTER — Encounter: Payer: BLUE CROSS/BLUE SHIELD | Admitting: Nurse Practitioner

## 2018-02-13 ENCOUNTER — Other Ambulatory Visit: Payer: Self-pay

## 2018-02-13 ENCOUNTER — Encounter: Payer: Self-pay | Admitting: Nurse Practitioner

## 2018-02-13 ENCOUNTER — Ambulatory Visit: Payer: BLUE CROSS/BLUE SHIELD | Attending: Nurse Practitioner | Admitting: Nurse Practitioner

## 2018-02-13 VITALS — BP 136/100 | HR 88 | Temp 98.0°F | Resp 16 | Ht 64.5 in | Wt 270.0 lb

## 2018-02-13 DIAGNOSIS — M8949 Other hypertrophic osteoarthropathy, multiple sites: Secondary | ICD-10-CM | POA: Insufficient documentation

## 2018-02-13 DIAGNOSIS — Z79891 Long term (current) use of opiate analgesic: Secondary | ICD-10-CM | POA: Diagnosis not present

## 2018-02-13 DIAGNOSIS — I10 Essential (primary) hypertension: Secondary | ICD-10-CM | POA: Diagnosis not present

## 2018-02-13 DIAGNOSIS — Z79899 Other long term (current) drug therapy: Secondary | ICD-10-CM | POA: Diagnosis not present

## 2018-02-13 DIAGNOSIS — Z825 Family history of asthma and other chronic lower respiratory diseases: Secondary | ICD-10-CM | POA: Diagnosis not present

## 2018-02-13 DIAGNOSIS — G894 Chronic pain syndrome: Secondary | ICD-10-CM | POA: Diagnosis not present

## 2018-02-13 DIAGNOSIS — M47816 Spondylosis without myelopathy or radiculopathy, lumbar region: Secondary | ICD-10-CM

## 2018-02-13 DIAGNOSIS — E559 Vitamin D deficiency, unspecified: Secondary | ICD-10-CM | POA: Insufficient documentation

## 2018-02-13 DIAGNOSIS — Z9049 Acquired absence of other specified parts of digestive tract: Secondary | ICD-10-CM | POA: Insufficient documentation

## 2018-02-13 DIAGNOSIS — M792 Neuralgia and neuritis, unspecified: Secondary | ICD-10-CM

## 2018-02-13 DIAGNOSIS — J45901 Unspecified asthma with (acute) exacerbation: Secondary | ICD-10-CM | POA: Insufficient documentation

## 2018-02-13 DIAGNOSIS — Z818 Family history of other mental and behavioral disorders: Secondary | ICD-10-CM | POA: Insufficient documentation

## 2018-02-13 DIAGNOSIS — F411 Generalized anxiety disorder: Secondary | ICD-10-CM | POA: Insufficient documentation

## 2018-02-13 DIAGNOSIS — M150 Primary generalized (osteo)arthritis: Secondary | ICD-10-CM | POA: Diagnosis not present

## 2018-02-13 DIAGNOSIS — Z7952 Long term (current) use of systemic steroids: Secondary | ICD-10-CM | POA: Diagnosis not present

## 2018-02-13 DIAGNOSIS — Z6841 Body Mass Index (BMI) 40.0 and over, adult: Secondary | ICD-10-CM | POA: Insufficient documentation

## 2018-02-13 DIAGNOSIS — Z87891 Personal history of nicotine dependence: Secondary | ICD-10-CM | POA: Diagnosis not present

## 2018-02-13 DIAGNOSIS — M797 Fibromyalgia: Secondary | ICD-10-CM

## 2018-02-13 DIAGNOSIS — M159 Polyosteoarthritis, unspecified: Secondary | ICD-10-CM

## 2018-02-13 DIAGNOSIS — Z791 Long term (current) use of non-steroidal anti-inflammatories (NSAID): Secondary | ICD-10-CM | POA: Insufficient documentation

## 2018-02-13 MED ORDER — PREGABALIN 150 MG PO CAPS: 150.0000 mg | ORAL_CAPSULE | Freq: Three times a day (TID) | ORAL | 2 refills | Status: DC

## 2018-02-13 MED ORDER — ORPHENADRINE CITRATE 30 MG/ML IJ SOLN
60.0000 mg | Freq: Once | INTRAMUSCULAR | Status: AC
  Administered 2018-02-13: 60 mg via INTRAMUSCULAR
  Filled 2018-02-13: qty 2

## 2018-02-13 MED ORDER — OXYCODONE HCL 5 MG PO TABS: 5.0000 mg | ORAL_TABLET | Freq: Four times a day (QID) | ORAL | 0 refills | Status: DC | PRN

## 2018-02-13 MED ORDER — METHOCARBAMOL 750 MG PO TABS: 750.0000 mg | ORAL_TABLET | Freq: Three times a day (TID) | ORAL | 2 refills | Status: DC | PRN

## 2018-02-13 MED ORDER — KETOROLAC TROMETHAMINE 60 MG/2ML IM SOLN
INTRAMUSCULAR | Status: AC
  Filled 2018-02-13: qty 2

## 2018-02-13 MED ORDER — OXYCODONE HCL 5 MG PO TABS: 5.0000 mg | ORAL_TABLET | ORAL | 0 refills | Status: DC | PRN

## 2018-02-13 MED ORDER — KETOROLAC TROMETHAMINE 60 MG/2ML IM SOLN
60.0000 mg | Freq: Once | INTRAMUSCULAR | Status: AC
  Administered 2018-02-13: 60 mg via INTRAMUSCULAR

## 2018-02-13 NOTE — Progress Notes (Signed)
Patient's Name: Mary Cruz  MRN: [id number]  Referring Provider: Emeterio Reeve, DO  DOB: [birth year]  PCP: Emeterio Reeve, DO  DOS: 02/13/2018  Note by: Vevelyn Francois NP  Service setting: Ambulatory outpatient  Specialty: Interventional Pain Management  Location: [facility] (AMB) Pain Management Facility    Patient type: Established    Primary Reason(s) for Visit: Encounter for prescription drug management & post-procedure evaluation of chronic illness with mild to moderate exacerbation(Level of risk: moderate) CC: Back Pain (lower)  HPI  Mary Cruz is a 43 y.o. year old, female patient, who comes today for a post-procedure evaluation and medication management. She has GERD; Irritable bowel syndrome; KIDNEY STONES; Essential hypertension, benign; Chronic pain syndrome; Generalized anxiety disorder; Obesity; Long term current use of opiate analgesic; Long term prescription opiate use; Opiate use (30 MME/Day); Opiate dependence ([place]); Encounter for therapeutic drug level monitoring; Musculoskeletal pain; Muscle cramping; Osteoarthrosis; Vitamin D insufficiency; Presence of functional implant ([employer] Lumbar spinal cord stimulator implant); Encounter for management of implanted device; Chronic low back pain (Bilateral) (R>L); Chronic lumbar radicular pain (Right L5 dermatome; Left S1 Dermatome) (Bilateral) (R>L); Chronic hip pain (Location of Tertiary source of pain) (Bilateral) (R>L); Chronic neck pain (Location of Primary Source of Pain) (Bilateral) (L>R); Chronic upper extremity pain (Location of Secondary source of pain) (Bilateral) (L>R); Morbid obesity ([place]); Nicotine dependence; Chronic cervical radicular pain (C7 Dermatome) (Bilateral) (L>R); Chronic lower extremity pain (Bilateral) (R>L); Right knee pain; Anxiety and depression; Implant pain at battery site (right buttocks area); Lumbar spondylosis; Lumbar facet syndrome (Bilateral) (R>L); Positive ANA (antinuclear antibody); Spinal cord  stimulator status; Battery end of life of spinal cord stimulator; Neurogenic pain; Fibromyalgia; Family history of thyroid disease; and Pain in right knee on their problem list. Her primarily concern today is the Back Pain (lower)  Pain Assessment: Location: Lower Back Radiating: down hip/butt down back of leg to mostly thigh but will radiate to the foot. Onset: More than a month ago Duration: Chronic pain Quality: Aching Severity: 6 /10 (subjective, self-reported pain score)  Note: Reported level is compatible with observation. Clinically the patient looks like a 2/10 A 2/10 is viewed as "Mild to Moderate" and described as noticeable and distracting. Impossible to hide from other people. More frequent flare-ups. Still possible to adapt and function close to normal. It can be very annoying and may have occasional stronger flare-ups. With discipline, patients may get used to it and adapt. Information on the proper use of the pain scale provided to the patient today. When using our objective Pain Scale, levels between 6 and 10/10 are said to belong in an emergency room, as it progressively worsens from a 6/10, described as severely limiting, requiring emergency care not usually available at an outpatient pain management facility. At a 6/10 level, communication becomes difficult and requires great effort. Assistance to reach the emergency department may be required. Facial flushing and profuse sweating along with potentially dangerous increases in heart rate and blood pressure will be evident. Effect on ADL: housework, walking, standing when it flares up Timing: Constant Modifying factors: medication, Predinsone BP: (!) 136/100  HR: 88  Mary Cruz was last seen on 12/01/2017 for a procedure. During today's appointment we reviewed Mary Cruz post-procedure results, as well as her outpatient medication regimen. She admits that her back pain has increased because of the cooking that she did over the  holiday. She admits that she was unable to bend and lift her leg. She admits that she has some prednisone  from the rheumatologist. She admits that it was effective however she is now having another flare up. She admits that it is mostly radiating down to the knee on the right.  She admits that she is going to start to loose weight. She is going to start with walking in her driveway.   Further details on both, my assessment(s), as well as the proposed treatment plan, please see below.  Controlled Substance Pharmacotherapy Assessment REMS (Risk Evaluation and Mitigation Strategy)  Analgesic:Oxycodone IR 5 mg every 6 hours when necessary for pain. (20 mg/day) MME/day:30 mg/day.Ignatius Specking, RN  02/13/2018  9:45 AM  Sign when Signing Visit Nursing Pain Medication Assessment:  Safety precautions to be maintained throughout the outpatient stay will include: orient to surroundings, keep bed in low position, maintain call bell within reach at all times, provide assistance with transfer out of bed and ambulation.  Medication Inspection Compliance: Pill count conducted under aseptic conditions, in front of the patient. Neither the pills nor the bottle was removed from the patient's sight at any time. Once count was completed pills were immediately returned to the patient in their original bottle.  Medication: Oxycodone IR Pill/Patch Count: 66 of 120 pills remain Pill/Patch Appearance: Markings consistent with prescribed medication Bottle Appearance: Standard pharmacy container. Clearly labeled. Filled Date: 31 / 15 / 2019 Last Medication intake:  Today   Pharmacokinetics: Liberation and absorption (onset of action): WNL Distribution (time to peak effect): WNL Metabolism and excretion (duration of action): WNL         Pharmacodynamics: Desired effects: Analgesia: Ms. Sann reports >50% benefit. Functional ability: Patient reports that medication allows her to accomplish basic  ADLs Clinically meaningful improvement in function (CMIF): Sustained CMIF goals met Perceived effectiveness: Described as relatively effective, allowing for increase in activities of daily living (ADL) Undesirable effects: Side-effects or Adverse reactions: None reported Monitoring: [place] PMP: Online review of the past 74-monthperiod conducted. Compliant with practice rules and regulations Last UDS on record: Summary  Date Value Ref Range Status  04/28/2017 FINAL  Final    Comment:    ==================================================================== TOXASSURE SELECT 13 (MW) ==================================================================== Test                             Result       Flag       Units Drug Present and Declared for Prescription Verification   Oxycodone                      3179         EXPECTED   ng/mg creat   Oxymorphone                    960          EXPECTED   ng/mg creat   Noroxycodone                   3191         EXPECTED   ng/mg creat   Noroxymorphone                 287          EXPECTED   ng/mg creat    Sources of oxycodone are scheduled prescription medications.    Oxymorphone, noroxycodone, and noroxymorphone are expected    metabolites of oxycodone. Oxymorphone is also available as a    scheduled prescription medication. ====================================================================  Test                      Result    Flag   Units      Ref Range   Creatinine              172              mg/dL      >=20 ==================================================================== Declared Medications:  The flagging and interpretation on this report are based on the  following declared medications.  Unexpected results may arise from  inaccuracies in the declared medications.  **Note: The testing scope of this panel includes these medications:  Oxycodone  **Note: The testing scope of this panel does not include following  reported medications:   Albuterol  Amitriptyline  Duloxetine  Famotidine  Ibuprofen  Methocarbamol  Omeprazole  Pregabalin  Vitamin D3 ==================================================================== For clinical consultation, please call [phone]. ====================================================================    UDS interpretation: Compliant          Medication Assessment Form: Reviewed. Patient indicates being compliant with therapy Treatment compliance: Compliant Risk Assessment Profile: Aberrant behavior: See prior evaluations. None observed or detected today Comorbid factors increasing risk of overdose: See prior notes. No additional risks detected today Opioid risk tool (ORT) (Total Score): 0 Personal History of Substance Abuse (SUD-Substance use disorder):  Alcohol: Negative  Illegal Drugs: Negative  Rx Drugs: Negative  ORT Risk Level calculation: Low Risk Risk of substance use disorder (SUD): Low Opioid Risk Tool - 02/13/18 0940      Family History of Substance Abuse   Alcohol  Negative    Illegal Drugs  Negative    Rx Drugs  Negative      Personal History of Substance Abuse   Alcohol  Negative    Illegal Drugs  Negative    Rx Drugs  Negative      History of Preadolescent Sexual Abuse   History of Preadolescent Sexual Abuse  Negative or Female      Psychological Disease   Psychological Disease  Negative    Depression  --   anxiety     Total Score   Opioid Risk Tool Scoring  0    Opioid Risk Interpretation  Low Risk      ORT Scoring interpretation table:  Score <3 = Low Risk for SUD  Score between 4-7 = Moderate Risk for SUD  Score >8 = High Risk for Opioid Abuse   Risk Mitigation Strategies:  Patient Counseling: Covered Patient-Prescriber Agreement (PPA): Present and active  Notification to other healthcare providers: Done  Pharmacologic Plan: No change in therapy, at this time.             Post-Procedure Assessment  12/01/2017 Procedure: Right-sided  lumbar facet radiofrequency ablation Pre-procedure pain score:  4/10 Post-procedure pain score: 0/10         Influential Factors: BMI: 45.63 kg/m Intra-procedural challenges: None observed.         Assessment challenges: None detected.              Reported side-effects: None.        Post-procedural adverse reactions or complications: None reported         Sedation: Please see nurses note. When no sedatives are used, the analgesic levels obtained are directly associated to the effectiveness of the local anesthetics. However, when sedation is provided, the level of analgesia obtained during the initial 1 hour following the intervention, is believed to be the  result of a combination of factors. These factors may include, but are not limited to: 1. The effectiveness of the local anesthetics used. 2. The effects of the analgesic(s) and/or anxiolytic(s) used. 3. The degree of discomfort experienced by the patient at the time of the procedure. 4. The patients ability and reliability in recalling and recording the events. 5. The presence and influence of possible secondary gains and/or psychosocial factors. Reported result: Relief experienced during the 1st hour after the procedure:   (Ultra-Short Term Relief)            Interpretative annotation: Clinically appropriate result. Analgesia during this period is likely to be Local Anesthetic and/or IV Sedative (Analgesic/Anxiolytic) related.          Effects of local anesthetic: The analgesic effects attained during this period are directly associated to the localized infiltration of local anesthetics and therefore cary significant diagnostic value as to the etiological location, or anatomical origin, of the pain. Expected duration of relief is directly dependent on the pharmacodynamics of the local anesthetic used. Long-acting (4-6 hours) anesthetics used.  Reported result: Relief during the next 4 to 6 hour after the procedure:   (Short-Term Relief)             Interpretative annotation: Clinically appropriate result. Analgesia during this period is likely to be Local Anesthetic-related.          Long-term benefit: Defined as the period of time past the expected duration of local anesthetics (1 hour for short-acting and 4-6 hours for long-acting). With the possible exception of prolonged sympathetic blockade from the local anesthetics, benefits during this period are typically attributed to, or associated with, other factors such as analgesic sensory neuropraxia, antiinflammatory effects, or beneficial biochemical changes provided by agents other than the local anesthetics.  Reported result: Extended relief following procedure:   (Long-Term Relief)            Interpretative annotation: Clinically possible results. Good relief. No permanent benefit expected. Inflammation plays a part in the etiology to the pain.          Current benefits: Defined as reported results that persistent at this point in time.   Analgesia: 50 % Ms. Pals reports that both, extremity and the axial pain improved with the treatment. Prior to flare up Function: Back to baseline secondary to overdoing on Christmas Day ROM: Back to baseline Interpretative annotation: Recurrence of symptoms.    Adequate RF ablation.          Interpretation: Results would suggest adequate radiofrequency ablation.                  Plan:  Please see "Plan of Care" for details.                Laboratory Chemistry  Inflammation Markers (CRP: Acute Phase) (ESR: Chronic Phase) Lab Results  Component Value Date   CRP 1.0 (H) 04/05/2015   ESRSEDRATE 35 (H) 04/05/2015   LATICACIDVEN 1.0 10/10/2013                         Rheumatology Markers Lab Results  Component Value Date   ANA POS (A) 09/11/2015                        Renal Function Markers Lab Results  Component Value Date   BUN 16 10/24/2016   CREATININE 1.01 11/94/1740   BCR NOT APPLICABLE 81/44/8185   GFRAA  81 10/24/2016    GFRNONAA 70 10/24/2016                             Hepatic Function Markers Lab Results  Component Value Date   AST 18 10/24/2016   ALT 16 10/24/2016   ALBUMIN 3.8 07/22/2016   ALKPHOS 96 07/22/2016                        Electrolytes Lab Results  Component Value Date   NA 138 10/24/2016   K 4.0 10/24/2016   CL 103 10/24/2016   CALCIUM 9.4 10/24/2016   MG 2.0 04/05/2015                        Neuropathy Markers No results found for: VITAMINB12, FOLATE, HGBA1C, HIV                      CNS Tests No results found for: COLORCSF, APPEARCSF, RBCCOUNTCSF, WBCCSF, POLYSCSF, LYMPHSCSF, EOSCSF, PROTEINCSF, GLUCCSF, JCVIRUS, CSFOLI, IGGCSF                      Bone Pathology Markers Lab Results  Component Value Date   VD25OH 25 (L) 10/24/2016                         Coagulation Parameters Lab Results  Component Value Date   INR 1.05 06/21/2016   LABPROT 13.8 06/21/2016   APTT 29 06/21/2016   PLT 285 10/24/2016                        Cardiovascular Markers Lab Results  Component Value Date   HGB 11.8 10/24/2016   HCT 35.6 10/24/2016                         CA Markers No results found for: CEA, CA125, LABCA2                      Note: Lab results reviewed.  Recent Diagnostic Imaging Results  DG C-Arm 1-60 Min-No Report Fluoroscopy was utilized by the requesting physician.  No radiographic  interpretation.   Complexity Note: Imaging results reviewed. Results shared with Ms. Kaminsky, using Layman's terms.                         Meds   Current Outpatient Medications:  .  albuterol (PROVENTIL HFA;VENTOLIN HFA) 108 (90 Base) MCG/ACT inhaler, Inhale 1-2 puffs into the lungs every 6 (six) hours as needed for wheezing or shortness of breath., Disp: 1 Inhaler, Rfl: 0 .  amitriptyline (ELAVIL) 100 MG tablet, TAKE 1 TABLET (100 MG TOTAL) BY MOUTH AT BEDTIME., Disp: 90 tablet, Rfl: 2 .  Cholecalciferol (VITAMIN D3) 2000 units TABS, Take 1 tablet by mouth daily., Disp: ,  Rfl:  .  clonazePAM (KLONOPIN) 0.5 MG tablet, Take 0.5-1 tablets (0.25-0.5 mg total) by mouth 2 (two) times daily as needed for anxiety., Disp: 30 tablet, Rfl: 0 .  Diclofenac Sodium (PENNSAID) 2 % SOLN, Pennsaid 20 mg/gram/actuation (2 %) topical soln in metered-dose pump  APPLY 2 (TWO) PUMPS TOPICALLY TO AFFECTED AREA(S) ON KNEES TWICE A DAY, Disp: , Rfl:  .  DULoxetine (CYMBALTA) 60 MG capsule, Take 1 capsule (60 mg total)  by mouth 2 (two) times daily., Disp: 180 capsule, Rfl: 0 .  [START ON 02/24/2018] methocarbamol (ROBAXIN) 750 MG tablet, Take 1 tablet (750 mg total) by mouth every 8 (eight) hours as needed for muscle spasms., Disp: 90 tablet, Rfl: 2 .  Naproxen-Esomeprazole (VIMOVO) 500-20 MG TBEC, Take by mouth., Disp: , Rfl:  .  omeprazole (PRILOSEC) 40 MG capsule, Take 1 capsule (40 mg total) by mouth daily., Disp: 90 capsule, Rfl: 3 .  [START ON 04/25/2018] oxyCODONE (OXY IR/ROXICODONE) 5 MG immediate release tablet, Take 1 tablet (5 mg total) by mouth every 6 (six) hours as needed for severe pain., Disp: 120 tablet, Rfl: 0 .  predniSONE (RAYOS) 5 MG TBEC, Take by mouth., Disp: , Rfl:  .  QUEtiapine (SEROQUEL) 25 MG tablet, quetiapine 25 mg tablet, Disp: , Rfl:  .  [START ON 03/26/2018] oxyCODONE (OXY IR/ROXICODONE) 5 MG immediate release tablet, Take 1 tablet (5 mg total) by mouth every 4 (four) hours as needed for severe pain., Disp: 120 tablet, Rfl: 0 .  [START ON 02/24/2018] oxyCODONE (OXY IR/ROXICODONE) 5 MG immediate release tablet, Take 1 tablet (5 mg total) by mouth every 6 (six) hours as needed for severe pain., Disp: 120 tablet, Rfl: 0 .  [START ON 02/24/2018] pregabalin (LYRICA) 150 MG capsule, Take 1 capsule (150 mg total) by mouth every 8 (eight) hours., Disp: 90 capsule, Rfl: 2  ROS  Constitutional: Denies any fever or chills Gastrointestinal: No reported hemesis, hematochezia, vomiting, or acute GI distress Musculoskeletal: Denies any acute onset joint swelling, redness, loss of  ROM, or weakness Neurological: No reported episodes of acute onset apraxia, aphasia, dysarthria, agnosia, amnesia, paralysis, loss of coordination, or loss of consciousness  Allergies  Ms. Ault is allergic to keflex [cephalexin]; amoxicillin; celebrex [celecoxib]; pentazocine lactate; bactrim [sulfamethoxazole-trimethoprim]; doxycycline; erythromycin; hydromorphone hcl; latex; and nabumetone.  [place]  Drug: Ms. Strong  reports no history of drug use. Alcohol:  reports no history of alcohol use. Tobacco:  reports that she has quit smoking. Her smoking use included cigarettes. She quit smokeless tobacco use about 8 weeks ago. Medical:  has a past medical history of Abscess of axillary fold (10/10/2013), Acute bronchitis with asthma with acute exacerbation (01/23/2015), Fibromyalgia, Hypertension, Lupus ([place]), Osteoarthritis, Pain, and Sepsis affecting skin (10/10/2013). Surgical: Ms. Liggett  has a past surgical history that includes spinal cord stimulator; Occipital nerve stimulator insertion; Cholecystectomy (2000); Knee surgery; Shoulder surgery; Tubal ligation (2000); Endometrial ablation; Spinal cord stimulator implant (2008); Knee surgery (Right, 02-2015); Breast surgery; and Spinal cord stimulator insertion (N/A, 06/21/2016). Family: family history includes Asthma in her mother; Depression in her mother; Fibromyalgia in an other family member; Stroke in an other family member; Thyroid disease in an other family member.  Constitutional Exam  General appearance: Well nourished, well developed, and well hydrated. In no apparent acute distress Vitals:   02/13/18 0930  BP: (!) 136/100  Pulse: 88  Resp: 16  Temp: 98 F (36.7 C)  SpO2: 99%  Weight: 270 lb (122.5 kg)  Height: 5' 4.5" (1.638 m)  Psych/Mental status: Alert, oriented x 3 (person, place, & time)       Eyes: PERLA Respiratory: No evidence of acute respiratory distress   Lumbar Spine Area Exam  Skin & Axial Inspection: No  masses, redness, or swelling Alignment: Symmetrical Functional ROM: Unrestricted ROM       Stability: No instability detected Muscle Tone/Strength: Functionally intact. No obvious neuro-muscular anomalies detected. Sensory (Neurological): Unimpaired Palpation: Complains of area being tender  to palpation       Provocative Tests: Hyperextension/rotation test: Positive       Lumbar quadrant test (Kemp's test): deferred today       Lateral bending test: (+)       Patrick's Maneuver: deferred today                     Gait & Posture Assessment  Ambulation: Unassisted Gait: Relatively normal for age and body habitus Posture: WNL   Lower Extremity Exam    Side: Right lower extremity  Side: Left lower extremity  Stability: No instability observed          Stability: No instability observed          Skin & Extremity Inspection: Skin color, temperature, and hair growth are WNL. No peripheral edema or cyanosis. No masses, redness, swelling, asymmetry, or associated skin lesions. No contractures.  Skin & Extremity Inspection: Skin color, temperature, and hair growth are WNL. No peripheral edema or cyanosis. No masses, redness, swelling, asymmetry, or associated skin lesions. No contractures.  Functional ROM: Unrestricted ROM                  Functional ROM: Unrestricted ROM                  Muscle Tone/Strength: Functionally intact. No obvious neuro-muscular anomalies detected.  Muscle Tone/Strength: Functionally intact. No obvious neuro-muscular anomalies detected.  Sensory (Neurological): Unimpaired        Sensory (Neurological): Unimpaired        Palpation: No palpable anomalies  Palpation: No palpable anomalies   Assessment  Primary Diagnosis & Pertinent Problem List: The primary encounter diagnosis was Lumbar spondylosis. Diagnoses of Primary osteoarthritis involving multiple joints, Fibromyalgia, Chronic pain syndrome, Neurogenic pain, and Long term current use of opiate analgesic were also  pertinent to this visit.  Status Diagnosis  Having a Flare-up Controlled Controlled 1. Lumbar spondylosis   2. Primary osteoarthritis involving multiple joints   3. Fibromyalgia   4. Chronic pain syndrome   5. Neurogenic pain   6. Long term current use of opiate analgesic     Problems updated and reviewed during this visit: No problems updated. Plan of Care  Pharmacotherapy (Medications Ordered): Meds ordered this encounter  Medications  . methocarbamol (ROBAXIN) 750 MG tablet    Sig: Take 1 tablet (750 mg total) by mouth every 8 (eight) hours as needed for muscle spasms.    Dispense:  90 tablet    Refill:  2    Do not place this medication, or any other prescription from our practice, on "Automatic Refill". Patient may have prescription filled one day early if pharmacy is closed on scheduled refill date.    Order Specific Question:   Supervising Provider    Answer:   Milinda Pointer [phone]  . oxyCODONE (OXY IR/ROXICODONE) 5 MG immediate release tablet    Sig: Take 1 tablet (5 mg total) by mouth every 6 (six) hours as needed for severe pain.    Dispense:  120 tablet    Refill:  0    Do not add this medication to the electronic "Automatic Refill" notification system. Patient may have prescription filled one day early if pharmacy is closed on scheduled refill date.    Order Specific Question:   Supervising Provider    Answer:   Milinda Pointer [phone]  . oxyCODONE (OXY IR/ROXICODONE) 5 MG immediate release tablet    Sig: Take 1 tablet (5 mg  total) by mouth every 4 (four) hours as needed for severe pain.    Dispense:  120 tablet    Refill:  0    Do not add this medication to the electronic "Automatic Refill" notification system. Patient may have prescription filled one day early if pharmacy is closed on scheduled refill date.    Order Specific Question:   Supervising Provider    Answer:   Milinda Pointer [phone]  . oxyCODONE (OXY IR/ROXICODONE) 5 MG immediate release  tablet    Sig: Take 1 tablet (5 mg total) by mouth every 6 (six) hours as needed for severe pain.    Dispense:  120 tablet    Refill:  0    Do not add this medication to the electronic "Automatic Refill" notification system. Patient may have prescription filled one day early if pharmacy is closed on scheduled refill date.    Order Specific Question:   Supervising Provider    Answer:   Milinda Pointer [phone]  . DISCONTD: pregabalin (LYRICA) 150 MG capsule    Sig: Take 1 capsule (150 mg total) by mouth every 8 (eight) hours.    Dispense:  90 capsule    Refill:  2    Do not place medication on "Automatic Refill". Fill one day early if pharmacy is closed on scheduled refill date.    Order Specific Question:   Supervising Provider    Answer:   Milinda Pointer [phone]  . orphenadrine (NORFLEX) injection 60 mg  . ketorolac (TORADOL) injection 60 mg  . pregabalin (LYRICA) 150 MG capsule    Sig: Take 1 capsule (150 mg total) by mouth every 8 (eight) hours.    Dispense:  90 capsule    Refill:  2    Do not place medication on "Automatic Refill". Fill one day early if pharmacy is closed on scheduled refill date.    Order Specific Question:   Supervising Provider    Answer:   Milinda Pointer [phone]   New Prescriptions   No medications on file   Medications administered today: We administered orphenadrine and ketorolac. Lab-work, procedure(s), and/or referral(s): Orders Placed This Encounter  Procedures  . ToxASSURE Select 13 (MW), Urine   Imaging and/or referral(s): None   Interventional therapies: Planned, scheduled, and/or pending:  None at this time.   Considering:  Palliative Bilateral lumbar facet radiofrequency.    Palliative PRN treatment(s):  Palliative left-sided C7-T1 cervical epidural steroid injection    Provider-requested follow-up: Return in about 3 months (around 05/15/2018) for MedMgmt.  Future Appointments  Date Time Provider [place]   05/21/2018  9:30 AM Vevelyn Francois, NP [facility] None   Primary Care Physician: Emeterio Reeve, DO Location: [facility] Note by: Vevelyn Francois NP Date: 02/13/2018; Time: 12:50 PM  Pain Score Disclaimer: We use the NRS-11 scale. This is a self-reported, subjective measurement of pain severity with only modest accuracy. It is used primarily to identify changes within a particular patient. It must be understood that outpatient pain scales are significantly less accurate that those used for research, where they can be applied under ideal controlled circumstances with minimal exposure to variables. In reality, the score is likely to be a combination of pain intensity and pain affect, where pain affect describes the degree of emotional arousal or changes in action readiness caused by the sensory experience of pain. Factors such as social and work situation, setting, emotional state, anxiety levels, expectation, and prior pain experience may influence pain perception  and show large inter-individual differences that may also be affected by time variables.  Patient instructions provided during this appointment: Patient Instructions   ____________________________________________________________________________________________  Medication Rules  Purpose: To inform patients, and their family members, of our rules and regulations.  Applies to: All patients receiving prescriptions (written or electronic).  Pharmacy of record: Pharmacy where electronic prescriptions will be sent. If written prescriptions are taken to a different pharmacy, please inform the nursing staff. The pharmacy listed in the electronic medical record should be the one where you would like electronic prescriptions to be sent.  Electronic prescriptions: In compliance with the [place] (STOP) Act of 2017 (Session Lanny Cramp [phone]), effective February 11, 2018, all  controlled substances must be electronically prescribed. Calling prescriptions to the pharmacy will cease to exist.  Prescription refills: Only during scheduled appointments. Applies to all prescriptions.  NOTE: The following applies primarily to controlled substances (Opioid* Pain Medications).   Patient's responsibilities: 1. Pain Pills: Bring all pain pills to every appointment (except for procedure appointments). 2. Pill Bottles: Bring pills in original pharmacy bottle. Always bring the newest bottle. Bring bottle, even if empty. 3. Medication refills: You are responsible for knowing and keeping track of what medications you take and those you need refilled. The day before your appointment: write a list of all prescriptions that need to be refilled. The day of the appointment: give the list to the admitting nurse. Prescriptions will be written only during appointments. If you forget a medication: it will not be "Called in", "Faxed", or "electronically sent". You will need to get another appointment to get these prescribed. No early refills. Do not call asking to have your prescription filled early. 4. Prescription Accuracy: You are responsible for carefully inspecting your prescriptions before leaving our office. Have the discharge nurse carefully go over each prescription with you, before taking them home. Make sure that your name is accurately spelled, that your address is correct. Check the name and dose of your medication to make sure it is accurate. Check the number of pills, and the written instructions to make sure they are clear and accurate. Make sure that you are given enough medication to last until your next medication refill appointment. 5. Taking Medication: Take medication as prescribed. When it comes to controlled substances, taking less pills or less frequently than prescribed is permitted and encouraged. Never take more pills than instructed. Never take medication more  frequently than prescribed.  6. Inform other Doctors: Always inform, all of your healthcare providers, of all the medications you take. 7. Pain Medication from other Providers: You are not allowed to accept any additional pain medication from any other Doctor or Healthcare provider. There are two exceptions to this rule. (see below) In the event that you require additional pain medication, you are responsible for notifying us, as stated below. 8. Medication Agreement: You are responsible for carefully reading and following our Medication Agreement. This must be signed before receiving any prescriptions from our practice. Safely store a copy of your signed Agreement. Violations to the Agreement will result in no further prescriptions. (Additional copies of our Medication Agreement are available upon request.) 9. Laws, Rules, & Regulations: All patients are expected to follow all Federal and [employer], Rules, [employer]. Ignorance of the Laws does not constitute a valid excuse. The use of any illegal substances is prohibited. 10. Adopted CDC guidelines & recommendations: Target dosing levels will be at or below 60 MME/day. Use of  benzodiazepines** is not recommended.  Exceptions: There are only two exceptions to the rule of not receiving pain medications from other Healthcare Providers. 1. Exception #1 (Emergencies): In the event of an emergency (i.e.: accident requiring emergency care), you are allowed to receive additional pain medication. However, you are responsible for: As soon as you are able, call our office [phone], at any time of the day or night, and leave a message stating your name, the date and nature of the emergency, and the name and dose of the medication prescribed. In the event that your call is answered by a member of our staff, make sure to document and save the date, time, and the name of the person that took your information.  2. Exception #2 (Planned Surgery): In  the event that you are scheduled by another doctor or dentist to have any type of surgery or procedure, you are allowed (for a period no longer than 30 days), to receive additional pain medication, for the acute post-op pain. However, in this case, you are responsible for picking up a copy of our "Post-op Pain Management for Surgeons" handout, and giving it to your surgeon or dentist. This document is available at our office, and does not require an appointment to obtain it. Simply go to our office during business hours (Monday-Thursday from 8:00 AM to 4:00 PM) (Friday 8:00 AM to 12:00 Noon) or if you have a scheduled appointment with [place], prior to your surgery, and ask for it by name. In addition, you will need to provide [place] with your name, name of your surgeon, type of surgery, and date of procedure or surgery.  *Opioid medications include: morphine, codeine, oxycodone, oxymorphone, hydrocodone, hydromorphone, meperidine, tramadol, tapentadol, buprenorphine, fentanyl, methadone. **Benzodiazepine medications include: diazepam (Valium), alprazolam (Xanax), clonazepam (Klonopine), lorazepam (Ativan), clorazepate (Tranxene), chlordiazepoxide (Librium), estazolam (Prosom), oxazepam (Serax), temazepam (Restoril), triazolam (Halcion) (Last updated: 04/10/2017) ____________________________________________________________________________________________    BMI Assessment: Estimated body mass index is 45.63 kg/m as calculated from the following:   Height as of this encounter: 5' 4.5" (1.638 m).   Weight as of this encounter: 270 lb (122.5 kg).  BMI interpretation table: BMI level Category Range association with higher incidence of chronic pain  <18 kg/m2 Underweight   18.5-24.9 kg/m2 Ideal body weight   25-29.9 kg/m2 Overweight Increased incidence by 20%  30-34.9 kg/m2 Obese (Class I) Increased incidence by 68%  35-39.9 kg/m2 Severe obesity (Class II) Increased incidence by 136%  >40 kg/m2 Extreme  obesity (Class III) Increased incidence by 254%   Patient's current BMI Ideal Body weight  Body mass index is 45.63 kg/m. Ideal body weight: 55.8 kg (123 lb 2 oz) Adjusted ideal body weight: 82.5 kg (181 lb 14 oz)   BMI Readings from Last 4 Encounters:  02/13/18 45.63 kg/m  12/01/17 44.63 kg/m  11/13/17 44.63 kg/m  11/03/17 44.63 kg/m   Wt Readings from Last 4 Encounters:  02/13/18 270 lb (122.5 kg)  12/01/17 260 lb (117.9 kg)  11/13/17 260 lb (117.9 kg)  11/03/17 260 lb (117.9 kg)   ______________________________________________________________________________________________  Specialty Pain Scale  Introduction:  There are significant differences in how pain is reported. The word pain usually refers to physical pain, but it is also a common synonym of suffering. The medical community uses a scale from 0 (zero) to 10 (ten) to report pain level. Zero (0) is described as "no pain", while ten (10) is described as "the worse pain you can imagine". The problem with this scale is that  physical pain is reported along with suffering. Suffering refers to mental pain, or more often yet it refers to any unpleasant feeling, emotion or aversion associated with the perception of harm or threat of harm. It is the psychological component of pain.  Pain Specialists prefer to separate the two components. The pain scale used by this practice is the Verbal Numerical Rating Scale (VNRS-11). This scale is for the physical pain only. DO NOT INCLUDE how your pain psychologically affects you. This scale is for adults 47 years of age and older. It has 11 (eleven) levels. The 1st level is 0/10. This means: "right now, I have no pain". In the context of pain management, it also means: "right now, my physical pain is under control with the current therapy".  General Information:  The scale should reflect your current level of pain. Unless you are specifically asked for the level of your worst pain, or your  average pain. If you are asked for one of these two, then it should be understood that it is over the past 24 hours.  Levels 1 (one) through 5 (five) are described below, and can be treated as an outpatient. Ambulatory pain management facilities such as ours are more than adequate to treat these levels. Levels 6 (six) through 10 (ten) are also described below, however, these must be treated as a hospitalized patient. While levels 6 (six) and 7 (seven) may be evaluated at an urgent care facility, levels 8 (eight) through 10 (ten) constitute medical emergencies and as such, they belong in a hospital's emergency department. When having these levels (as described below), do not come to our office. Our facility is not equipped to manage these levels. Go directly to an urgent care facility or an emergency department to be evaluated.  Definitions:  Activities of Daily Living (ADL): Activities of daily living (ADL or ADLs) is a term used in healthcare to refer to people's daily self-care activities. Health professionals often use a person's ability or inability to perform ADLs as a measurement of their functional status, particularly in regard to people post injury, with disabilities and the elderly. There are two ADL levels: Basic and Instrumental. Basic Activities of Daily Living (BADL  or BADLs) consist of self-care tasks that include: Bathing and showering; personal hygiene and grooming (including brushing/combing/styling hair); dressing; Toilet hygiene (getting to the toilet, cleaning oneself, and getting back up); eating and self-feeding (not including cooking or chewing and swallowing); functional mobility, often referred to as "transferring", as measured by the ability to walk, get in and out of bed, and get into and out of a chair; the broader definition (moving from one place to another while performing activities) is useful for people with different physical abilities who are still able to get around  independently. Basic ADLs include the things many people do when they get up in the morning and get ready to go out of the house: get out of bed, go to the toilet, bathe, dress, groom, and eat. On the average, loss of function typically follows a particular order. Hygiene is the first to go, followed by loss of toilet use and locomotion. The last to go is the ability to eat. When there is only one remaining area in which the person is independent, there is a 62.9% chance that it is eating and only a 3.5% chance that it is hygiene. Instrumental Activities of Daily Living (IADL or IADLs) are not necessary for fundamental functioning, but they let an individual live independently  in a community. IADL consist of tasks that include: cleaning and maintaining the house; home establishment and maintenance; care of others (including selecting and supervising caregivers); care of pets; child rearing; managing money; managing financials (investments, etc.); meal preparation and cleanup; shopping for groceries and necessities; moving within the community; safety procedures and emergency responses; health management and maintenance (taking prescribed medications); and using the telephone or other form of communication.  Instructions:  Most patients tend to report their pain as a combination of two factors, their physical pain and their psychosocial pain. This last one is also known as "suffering" and it is reflection of how physical pain affects you socially and psychologically. From now on, report them separately.  From this point on, when asked to report your pain level, report only your physical pain. Use the following table for reference.  Pain Clinic Pain Levels (0-5/10)  Pain Level Score  Description  No Pain 0   Mild pain 1 Nagging, annoying, but does not interfere with basic activities of daily living (ADL). Patients are able to eat, bathe, get dressed, toileting (being able to get on and off the toilet and  perform personal hygiene functions), transfer (move in and out of bed or a chair without assistance), and maintain continence (able to control bladder and bowel functions). Blood pressure and heart rate are unaffected. A normal heart rate for a healthy adult ranges from 60 to 100 bpm (beats per minute).   Mild to moderate pain 2 Noticeable and distracting. Impossible to hide from other people. More frequent flare-ups. Still possible to adapt and function close to normal. It can be very annoying and may have occasional stronger flare-ups. With discipline, patients may get used to it and adapt.   Moderate pain 3 Interferes significantly with activities of daily living (ADL). It becomes difficult to feed, bathe, get dressed, get on and off the toilet or to perform personal hygiene functions. Difficult to get in and out of bed or a chair without assistance. Very distracting. With effort, it can be ignored when deeply involved in activities.   Moderately severe pain 4 Impossible to ignore for more than a few minutes. With effort, patients may still be able to manage work or participate in some social activities. Very difficult to concentrate. Signs of autonomic nervous system discharge are evident: dilated pupils (mydriasis); mild sweating (diaphoresis); sleep interference. Heart rate becomes elevated (>115 bpm). Diastolic blood pressure (lower number) rises above 100 mmHg. Patients find relief in laying down and not moving.   Severe pain 5 Intense and extremely unpleasant. Associated with frowning face and frequent crying. Pain overwhelms the senses.  Ability to do any activity or maintain social relationships becomes significantly limited. Conversation becomes difficult. Pacing back and forth is common, as getting into a comfortable position is nearly impossible. Pain wakes you up from deep sleep. Physical signs will be obvious: pupillary dilation; increased sweating; goosebumps; brisk reflexes; cold, clammy  hands and feet; nausea, vomiting or dry heaves; loss of appetite; significant sleep disturbance with inability to fall asleep or to remain asleep. When persistent, significant weight loss is observed due to the complete loss of appetite and sleep deprivation.  Blood pressure and heart rate becomes significantly elevated. Caution: If elevated blood pressure triggers a pounding headache associated with blurred vision, then the patient should immediately seek attention at an urgent or emergency care unit, as these may be signs of an impending stroke.    Emergency Department Pain Levels (6-10/10)  Emergency Room  Pain 6 Severely limiting. Requires emergency care and should not be seen or managed at an outpatient pain management facility. Communication becomes difficult and requires great effort. Assistance to reach the emergency department may be required. Facial flushing and profuse sweating along with potentially dangerous increases in heart rate and blood pressure will be evident.   Distressing pain 7 Self-care is very difficult. Assistance is required to transport, or use restroom. Assistance to reach the emergency department will be required. Tasks requiring coordination, such as bathing and getting dressed become very difficult.   Disabling pain 8 Self-care is no longer possible. At this level, pain is disabling. The individual is unable to do even the most "basic" activities such as walking, eating, bathing, dressing, transferring to a bed, or toileting. Fine motor skills are lost. It is difficult to think clearly.   Incapacitating pain 9 Pain becomes incapacitating. Thought processing is no longer possible. Difficult to remember your own name. Control of movement and coordination are lost.   The worst pain imaginable 10 At this level, most patients pass out from pain. When this level is reached, collapse of the autonomic nervous system occurs, leading to a sudden drop in blood pressure and heart  rate. This in turn results in a temporary and dramatic drop in blood flow to the brain, leading to a loss of consciousness. Fainting is one of the body's self defense mechanisms. Passing out puts the brain in a calmed state and causes it to shut down for a while, in order to begin the healing process.    Summary: 1. Refer to this scale when providing [place] with your pain level. 2. Be accurate and careful when reporting your pain level. This will help with your care. 3. Over-reporting your pain level will lead to loss of credibility. 4. Even a level of 1/10 means that there is pain and will be treated at our facility. 5. High, inaccurate reporting will be documented as "Symptom Exaggeration", leading to loss of credibility and suspicions of possible secondary gains such as obtaining more narcotics, or wanting to appear disabled, for fraudulent reasons. 6. Only pain levels of 5 or below will be seen at our facility. 7. Pain levels of 6 and above will be sent to the Emergency Department and the appointment cancelled. ______________________________________________________________________________________________    BMI Assessment: Estimated body mass index is 45.63 kg/m as calculated from the following:   Height as of this encounter: 5' 4.5" (1.638 m).   Weight as of this encounter: 270 lb (122.5 kg).  BMI interpretation table: BMI level Category Range association with higher incidence of chronic pain  <18 kg/m2 Underweight   18.5-24.9 kg/m2 Ideal body weight   25-29.9 kg/m2 Overweight Increased incidence by 20%  30-34.9 kg/m2 Obese (Class I) Increased incidence by 68%  35-39.9 kg/m2 Severe obesity (Class II) Increased incidence by 136%  >40 kg/m2 Extreme obesity (Class III) Increased incidence by 254%   Patient's current BMI Ideal Body weight  Body mass index is 45.63 kg/m. Ideal body weight: 55.8 kg (123 lb 2 oz) Adjusted ideal body weight: 82.5 kg (181 lb 14 oz)   BMI Readings from Last  4 Encounters:  02/13/18 45.63 kg/m  12/01/17 44.63 kg/m  11/13/17 44.63 kg/m  11/03/17 44.63 kg/m   Wt Readings from Last 4 Encounters:  02/13/18 270 lb (122.5 kg)  12/01/17 260 lb (117.9 kg)  11/13/17 260 lb (117.9 kg)  11/03/17 260 lb (117.9 kg)    oxycocodone 5 mg x 3 months  to begin filling on 02/23/18, 03/27/18, 04/25/18 escribed to your pharmacy   Lyrica and Robaxin also escribed  toradol 60 mg and Norflex 60 mg given  IM in left and right gluteal muscle respectively.

## 2018-02-13 NOTE — Progress Notes (Signed)
Nursing Pain Medication Assessment:  Safety precautions to be maintained throughout the outpatient stay will include: orient to surroundings, keep bed in low position, maintain call bell within reach at all times, provide assistance with transfer out of bed and ambulation.  Medication Inspection Compliance: Pill count conducted under aseptic conditions, in front of the patient. Neither the pills nor the bottle was removed from the patient's sight at any time. Once count was completed pills were immediately returned to the patient in their original bottle.  Medication: Oxycodone IR Pill/Patch Count: 66 of 120 pills remain Pill/Patch Appearance: Markings consistent with prescribed medication Bottle Appearance: Standard pharmacy container. Clearly labeled. Filled Date: 75 / 15 / 2019 Last Medication intake:  Today

## 2018-02-13 NOTE — Patient Instructions (Addendum)
____________________________________________________________________________________________  Medication Rules  Purpose: To inform patients, and their family members, of our rules and regulations.  Applies to: All patients receiving prescriptions (written or electronic).  Pharmacy of record: Pharmacy where electronic prescriptions will be sent. If written prescriptions are taken to a different pharmacy, please inform the nursing staff. The pharmacy listed in the electronic medical record should be the one where you would like electronic prescriptions to be sent.  Electronic prescriptions: In compliance with the [place] Strengthen Opioid Misuse Prevention (STOP) Act of 2017 (Session Law 2017-74/H243), effective February 11, 2018, all controlled substances must be electronically prescribed. Calling prescriptions to the pharmacy will cease to exist.  Prescription refills: Only during scheduled appointments. Applies to all prescriptions.  NOTE: The following applies primarily to controlled substances (Opioid* Pain Medications).   Patient's responsibilities: 1. Pain Pills: Bring all pain pills to every appointment (except for procedure appointments). 2. Pill Bottles: Bring pills in original pharmacy bottle. Always bring the newest bottle. Bring bottle, even if empty. 3. Medication refills: You are responsible for knowing and keeping track of what medications you take and those you need refilled. The day before your appointment: write a list of all prescriptions that need to be refilled. The day of the appointment: give the list to the admitting nurse. Prescriptions will be written only during appointments. If you forget a medication: it will not be "Called in", "Faxed", or "electronically sent". You will need to get another appointment to get these prescribed. No early refills. Do not call asking to have your prescription filled early. 4. Prescription Accuracy: You are responsible for  carefully inspecting your prescriptions before leaving our office. Have the discharge nurse carefully go over each prescription with you, before taking them home. Make sure that your name is accurately spelled, that your address is correct. Check the name and dose of your medication to make sure it is accurate. Check the number of pills, and the written instructions to make sure they are clear and accurate. Make sure that you are given enough medication to last until your next medication refill appointment. 5. Taking Medication: Take medication as prescribed. When it comes to controlled substances, taking less pills or less frequently than prescribed is permitted and encouraged. Never take more pills than instructed. Never take medication more frequently than prescribed.  6. Inform other Doctors: Always inform, all of your healthcare providers, of all the medications you take. 7. Pain Medication from other Providers: You are not allowed to accept any additional pain medication from any other Doctor or Healthcare provider. There are two exceptions to this rule. (see below) In the event that you require additional pain medication, you are responsible for notifying us, as stated below. 8. Medication Agreement: You are responsible for carefully reading and following our Medication Agreement. This must be signed before receiving any prescriptions from our practice. Safely store a copy of your signed Agreement. Violations to the Agreement will result in no further prescriptions. (Additional copies of our Medication Agreement are available upon request.) 9. Laws, Rules, & Regulations: All patients are expected to follow all Federal and State Laws, Statutes, Rules, & Regulations. Ignorance of the Laws does not constitute a valid excuse. The use of any illegal substances is prohibited. 10. Adopted CDC guidelines & recommendations: Target dosing levels will be at or below 60 MME/day. Use of benzodiazepines** is not  recommended.  Exceptions: There are only two exceptions to the rule of not receiving pain medications from other Healthcare Providers. 1.   Exception #1 (Emergencies): In the event of an emergency (i.e.: accident requiring emergency care), you are allowed to receive additional pain medication. However, you are responsible for: As soon as you are able, call our office [phone], at any time of the day or night, and leave a message stating your name, the date and nature of the emergency, and the name and dose of the medication prescribed. In the event that your call is answered by a member of our staff, make sure to document and save the date, time, and the name of the person that took your information.  2. Exception #2 (Planned Surgery): In the event that you are scheduled by another doctor or dentist to have any type of surgery or procedure, you are allowed (for a period no longer than 30 days), to receive additional pain medication, for the acute post-op pain. However, in this case, you are responsible for picking up a copy of our "Post-op Pain Management for Surgeons" handout, and giving it to your surgeon or dentist. This document is available at our office, and does not require an appointment to obtain it. Simply go to our office during business hours (Monday-Thursday from 8:00 AM to 4:00 PM) (Friday 8:00 AM to 12:00 Noon) or if you have a scheduled appointment with [place], prior to your surgery, and ask for it by name. In addition, you will need to provide [place] with your name, name of your surgeon, type of surgery, and date of procedure or surgery.  *Opioid medications include: morphine, codeine, oxycodone, oxymorphone, hydrocodone, hydromorphone, meperidine, tramadol, tapentadol, buprenorphine, fentanyl, methadone. **Benzodiazepine medications include: diazepam (Valium), alprazolam (Xanax), clonazepam (Klonopine), lorazepam (Ativan), clorazepate (Tranxene), chlordiazepoxide (Librium), estazolam (Prosom),  oxazepam (Serax), temazepam (Restoril), triazolam (Halcion) (Last updated: 04/10/2017) ____________________________________________________________________________________________    BMI Assessment: Estimated body mass index is 45.63 kg/m as calculated from the following:   Height as of this encounter: 5' 4.5" (1.638 m).   Weight as of this encounter: 270 lb (122.5 kg).  BMI interpretation table: BMI level Category Range association with higher incidence of chronic pain  <18 kg/m2 Underweight   18.5-24.9 kg/m2 Ideal body weight   25-29.9 kg/m2 Overweight Increased incidence by 20%  30-34.9 kg/m2 Obese (Class I) Increased incidence by 68%  35-39.9 kg/m2 Severe obesity (Class II) Increased incidence by 136%  >40 kg/m2 Extreme obesity (Class III) Increased incidence by 254%   Patient's current BMI Ideal Body weight  Body mass index is 45.63 kg/m. Ideal body weight: 55.8 kg (123 lb 2 oz) Adjusted ideal body weight: 82.5 kg (181 lb 14 oz)   BMI Readings from Last 4 Encounters:  02/13/18 45.63 kg/m  12/01/17 44.63 kg/m  11/13/17 44.63 kg/m  11/03/17 44.63 kg/m   Wt Readings from Last 4 Encounters:  02/13/18 270 lb (122.5 kg)  12/01/17 260 lb (117.9 kg)  11/13/17 260 lb (117.9 kg)  11/03/17 260 lb (117.9 kg)   ______________________________________________________________________________________________  Specialty Pain Scale  Introduction:  There are significant differences in how pain is reported. The word pain usually refers to physical pain, but it is also a common synonym of suffering. The medical community uses a scale from 0 (zero) to 10 (ten) to report pain level. Zero (0) is described as "no pain", while ten (10) is described as "the worse pain you can imagine". The problem with this scale is that physical pain is reported along with suffering. Suffering refers to mental pain, or more often yet it refers to any unpleasant feeling, emotion or aversion  associated with the  perception of harm or threat of harm. It is the psychological component of pain.  Pain Specialists prefer to separate the two components. The pain scale used by this practice is the Verbal Numerical Rating Scale (VNRS-11). This scale is for the physical pain only. DO NOT INCLUDE how your pain psychologically affects you. This scale is for adults 81 years of age and older. It has 11 (eleven) levels. The 1st level is 0/10. This means: "right now, I have no pain". In the context of pain management, it also means: "right now, my physical pain is under control with the current therapy".  General Information:  The scale should reflect your current level of pain. Unless you are specifically asked for the level of your worst pain, or your average pain. If you are asked for one of these two, then it should be understood that it is over the past 24 hours.  Levels 1 (one) through 5 (five) are described below, and can be treated as an outpatient. Ambulatory pain management facilities such as ours are more than adequate to treat these levels. Levels 6 (six) through 10 (ten) are also described below, however, these must be treated as a hospitalized patient. While levels 6 (six) and 7 (seven) may be evaluated at an urgent care facility, levels 8 (eight) through 10 (ten) constitute medical emergencies and as such, they belong in a hospital's emergency department. When having these levels (as described below), do not come to our office. Our facility is not equipped to manage these levels. Go directly to an urgent care facility or an emergency department to be evaluated.  Definitions:  Activities of Daily Living (ADL): Activities of daily living (ADL or ADLs) is a term used in healthcare to refer to people's daily self-care activities. Health professionals often use a person's ability or inability to perform ADLs as a measurement of their functional status, particularly in regard to people post injury, with disabilities and  the elderly. There are two ADL levels: Basic and Instrumental. Basic Activities of Daily Living (BADL  or BADLs) consist of self-care tasks that include: Bathing and showering; personal hygiene and grooming (including brushing/combing/styling hair); dressing; Toilet hygiene (getting to the toilet, cleaning oneself, and getting back up); eating and self-feeding (not including cooking or chewing and swallowing); functional mobility, often referred to as "transferring", as measured by the ability to walk, get in and out of bed, and get into and out of a chair; the broader definition (moving from one place to another while performing activities) is useful for people with different physical abilities who are still able to get around independently. Basic ADLs include the things many people do when they get up in the morning and get ready to go out of the house: get out of bed, go to the toilet, bathe, dress, groom, and eat. On the average, loss of function typically follows a particular order. Hygiene is the first to go, followed by loss of toilet use and locomotion. The last to go is the ability to eat. When there is only one remaining area in which the person is independent, there is a 62.9% chance that it is eating and only a 3.5% chance that it is hygiene. Instrumental Activities of Daily Living (IADL or IADLs) are not necessary for fundamental functioning, but they let an individual live independently in a community. IADL consist of tasks that include: cleaning and maintaining the house; home establishment and maintenance; care of others (including selecting and supervising  caregivers); care of pets; child rearing; managing money; managing financials (investments, etc.); meal preparation and cleanup; shopping for groceries and necessities; moving within the community; safety procedures and emergency responses; health management and maintenance (taking prescribed medications); and using the telephone or other form  of communication.  Instructions:  Most patients tend to report their pain as a combination of two factors, their physical pain and their psychosocial pain. This last one is also known as "suffering" and it is reflection of how physical pain affects you socially and psychologically. From now on, report them separately.  From this point on, when asked to report your pain level, report only your physical pain. Use the following table for reference.  Pain Clinic Pain Levels (0-5/10)  Pain Level Score  Description  No Pain 0   Mild pain 1 Nagging, annoying, but does not interfere with basic activities of daily living (ADL). Patients are able to eat, bathe, get dressed, toileting (being able to get on and off the toilet and perform personal hygiene functions), transfer (move in and out of bed or a chair without assistance), and maintain continence (able to control bladder and bowel functions). Blood pressure and heart rate are unaffected. A normal heart rate for a healthy adult ranges from 60 to 100 bpm (beats per minute).   Mild to moderate pain 2 Noticeable and distracting. Impossible to hide from other people. More frequent flare-ups. Still possible to adapt and function close to normal. It can be very annoying and may have occasional stronger flare-ups. With discipline, patients may get used to it and adapt.   Moderate pain 3 Interferes significantly with activities of daily living (ADL). It becomes difficult to feed, bathe, get dressed, get on and off the toilet or to perform personal hygiene functions. Difficult to get in and out of bed or a chair without assistance. Very distracting. With effort, it can be ignored when deeply involved in activities.   Moderately severe pain 4 Impossible to ignore for more than a few minutes. With effort, patients may still be able to manage work or participate in some social activities. Very difficult to concentrate. Signs of autonomic nervous system discharge are  evident: dilated pupils (mydriasis); mild sweating (diaphoresis); sleep interference. Heart rate becomes elevated (>115 bpm). Diastolic blood pressure (lower number) rises above 100 mmHg. Patients find relief in laying down and not moving.   Severe pain 5 Intense and extremely unpleasant. Associated with frowning face and frequent crying. Pain overwhelms the senses.  Ability to do any activity or maintain social relationships becomes significantly limited. Conversation becomes difficult. Pacing back and forth is common, as getting into a comfortable position is nearly impossible. Pain wakes you up from deep sleep. Physical signs will be obvious: pupillary dilation; increased sweating; goosebumps; brisk reflexes; cold, clammy hands and feet; nausea, vomiting or dry heaves; loss of appetite; significant sleep disturbance with inability to fall asleep or to remain asleep. When persistent, significant weight loss is observed due to the complete loss of appetite and sleep deprivation.  Blood pressure and heart rate becomes significantly elevated. Caution: If elevated blood pressure triggers a pounding headache associated with blurred vision, then the patient should immediately seek attention at an urgent or emergency care unit, as these may be signs of an impending stroke.    Emergency Department Pain Levels (6-10/10)  Emergency Room Pain 6 Severely limiting. Requires emergency care and should not be seen or managed at an outpatient pain management facility. Communication becomes difficult and requires  great effort. Assistance to reach the emergency department may be required. Facial flushing and profuse sweating along with potentially dangerous increases in heart rate and blood pressure will be evident.   Distressing pain 7 Self-care is very difficult. Assistance is required to transport, or use restroom. Assistance to reach the emergency department will be required. Tasks requiring coordination, such as  bathing and getting dressed become very difficult.   Disabling pain 8 Self-care is no longer possible. At this level, pain is disabling. The individual is unable to do even the most "basic" activities such as walking, eating, bathing, dressing, transferring to a bed, or toileting. Fine motor skills are lost. It is difficult to think clearly.   Incapacitating pain 9 Pain becomes incapacitating. Thought processing is no longer possible. Difficult to remember your own name. Control of movement and coordination are lost.   The worst pain imaginable 10 At this level, most patients pass out from pain. When this level is reached, collapse of the autonomic nervous system occurs, leading to a sudden drop in blood pressure and heart rate. This in turn results in a temporary and dramatic drop in blood flow to the brain, leading to a loss of consciousness. Fainting is one of the body's self defense mechanisms. Passing out puts the brain in a calmed state and causes it to shut down for a while, in order to begin the healing process.    Summary: 1. Refer to this scale when providing [place] with your pain level. 2. Be accurate and careful when reporting your pain level. This will help with your care. 3. Over-reporting your pain level will lead to loss of credibility. 4. Even a level of 1/10 means that there is pain and will be treated at our facility. 5. High, inaccurate reporting will be documented as "Symptom Exaggeration", leading to loss of credibility and suspicions of possible secondary gains such as obtaining more narcotics, or wanting to appear disabled, for fraudulent reasons. 6. Only pain levels of 5 or below will be seen at our facility. 7. Pain levels of 6 and above will be sent to the Emergency Department and the appointment cancelled. ______________________________________________________________________________________________    BMI Assessment: Estimated body mass index is 45.63 kg/m as calculated  from the following:   Height as of this encounter: 5' 4.5" (1.638 m).   Weight as of this encounter: 270 lb (122.5 kg).  BMI interpretation table: BMI level Category Range association with higher incidence of chronic pain  <18 kg/m2 Underweight   18.5-24.9 kg/m2 Ideal body weight   25-29.9 kg/m2 Overweight Increased incidence by 20%  30-34.9 kg/m2 Obese (Class I) Increased incidence by 68%  35-39.9 kg/m2 Severe obesity (Class II) Increased incidence by 136%  >40 kg/m2 Extreme obesity (Class III) Increased incidence by 254%   Patient's current BMI Ideal Body weight  Body mass index is 45.63 kg/m. Ideal body weight: 55.8 kg (123 lb 2 oz) Adjusted ideal body weight: 82.5 kg (181 lb 14 oz)   BMI Readings from Last 4 Encounters:  02/13/18 45.63 kg/m  12/01/17 44.63 kg/m  11/13/17 44.63 kg/m  11/03/17 44.63 kg/m   Wt Readings from Last 4 Encounters:  02/13/18 270 lb (122.5 kg)  12/01/17 260 lb (117.9 kg)  11/13/17 260 lb (117.9 kg)  11/03/17 260 lb (117.9 kg)    oxycocodone 5 mg x 3 months to begin filling on 02/23/18, 03/27/18, 04/25/18 escribed to your pharmacy   Lyrica and Robaxin also escribed  toradol 60 mg and Norflex 60  mg given  IM in left and right gluteal muscle respectively.

## 2018-02-19 LAB — TOXASSURE SELECT 13 (MW), URINE

## 2018-02-25 ENCOUNTER — Telehealth: Payer: Self-pay

## 2018-02-25 NOTE — Telephone Encounter (Signed)
As per Rep Ludwig Clarks - duloxetine 60mg /bid will required PA. Plan only covers #30/30 days. Pls call [phone]. Thanks in advance.

## 2018-02-26 NOTE — Telephone Encounter (Signed)
I called the patients insurance nad per guidelines they will have to fax the form over since its a certain way it has to be filled out. Waiting on a fax.   I have left the patient a brief VM that we are waiting on a form from her insurance and we will call her once we get a response.

## 2018-03-16 NOTE — Telephone Encounter (Signed)
I spoke with Elmo Putt at [facility] and she states that according to their records's the patient does not have insurance.   She gave me a help desk number to call and see if any further information could be received.   I called the benefit card services and spoke Lysbeth Galas and he did some investigating and the form was faxed to another office. He will have this re sent to the office. He will place this as an Urgent and fax the office a new form so we can get the medication approved. Waiting on fax.

## 2018-03-19 NOTE — Telephone Encounter (Signed)
Form has been received and filled out. waiting on provider signature.

## 2018-03-20 NOTE — Telephone Encounter (Signed)
Forms faxed to ins[employer] and received a confirmation that all pages were received.

## 2018-04-06 ENCOUNTER — Encounter: Payer: Self-pay | Admitting: Sports Medicine

## 2018-04-06 ENCOUNTER — Ambulatory Visit
Admission: RE | Admit: 2018-04-06 | Discharge: 2018-04-06 | Disposition: A | Discharge status: Home / Self Care | Payer: BLUE CROSS/BLUE SHIELD | Source: Ambulatory Visit | Attending: Sports Medicine | Admitting: Sports Medicine

## 2018-04-06 ENCOUNTER — Ambulatory Visit: Payer: BLUE CROSS/BLUE SHIELD | Admitting: Sports Medicine

## 2018-04-06 VITALS — BP 163/97 | Ht 64.5 in | Wt 240.0 lb

## 2018-04-06 DIAGNOSIS — M25561 Pain in right knee: Secondary | ICD-10-CM | POA: Diagnosis not present

## 2018-04-06 NOTE — Progress Notes (Signed)
Subjective:     Patient ID: Mary Cruz, female   DOB: 11/13/1975, 43 y.o.   MRN: [id number]  HPI Mary Cruz is a 43yo with PMHx of lumbar spondylosis and R meniscus tear s/p repair 1.5 years ago who presents for worsening R knee pain. She reports that about 6 months after her meniscal repair she fell directly on her R knee, for which an MRI was ordered and showed a fracture of the tibial tubercle (without any injury to soft tissues or ligaments, though we do not have the report). She was told to immobilize and nonweight bear, but her pain never improved. She has continued seeing physicians for this knee pain and reports being told that the pain should continue to improve but that it does not, so she is here for a second opinion.  She reports that the knee pain is worst in the medial aspect, with some intermittent pain over the fracture site at the tibial tubercle as well. Now the pain is so intense that it wakes her up from sleep. She says the pain can be worse with extended walking or standing, and when she gets on and off her horse. The medication she takes for her back pain does help some with the knee pain, and she occasionally takes Duexis for the knee pain as well with some relief.  She does report chronic pain in her R hip as well that is unchanged from prior.  Review of Systems As above.    Objective:   Physical Exam  R knee: trace effusion noted without erythema, ecchymoses, or warmth; tenderness to palpation over medial aspect of patella, medial joint line, posteromedial joint line, tibial tubercle, and pes bursa, without tenderness to palpation over the anterior, superior, or lateral patella, lateral joint line, or posterolateral joint line; full ROM with pain at the extreme of extension; crepitus with flexion and extension; 4/5 strength with flexion and extension of the knee likely limited most by pain; negative varus and valgus stress test; negative anterior and posterior  drawer; negative Lachman's and McMurry's; positive Thessaly's  L knee: no effusion, erythema, ecchymoses, or warmth; mild tenderness to palpation over pes anserine bursa, without tenderness to palpation over patella, medial or lateral joint line, tibial tubercle, or posterior knee; full ROM without pain; full strength with flexion and extension  R hip: no effusion, erythema, ecchymoses, or warmth; no tenderness to palpation; full ROM in flexion and extension, ROM limited by pain on internal and external rotation; full strength with adduction and abduction  L hip: no effusion, erythema, ecchymoses, or warmth; no tenderness to palpation; full ROM in flexion, extension, internal and external rotation; full strength with adduction and abduction    Assessment:     It is possible that there is a reinjury to the medial meniscus given positive Thessaly's, intermittent catching, and the sheer chronicity of the pain. Could also be due to medial compartment osteoarthritis. She may have a component of pes anserine bursitis, however this would not explain the pain over her medial joint line. Given how long this has been going on and the possibility that it is a meniscal injury that warrants repair, will evaluate with imaging to determine next steps.    Plan:     - R knee AP, lateral, and sunrise plain radiographs  - MRI R knee to r/o meniscal tear - Will call her with any positive results from the X-rays, or, when the MRI results to discuss follow up and next steps  Jeris Easterly, MS4     Patient seen and evaluated with the medical student.  I agree with the above plan of care.  X-rays show only mild degenerative changes but nothing acute.  MRI of the right knee specifically to rule out a meniscal tear which may need operative intervention.  Phone follow-up with those results when available we will delineate a more definitive treatment plan based on those findings.

## 2018-04-07 ENCOUNTER — Encounter: Payer: Self-pay | Admitting: Sports Medicine

## 2018-04-16 ENCOUNTER — Ambulatory Visit
Admission: RE | Admit: 2018-04-16 | Discharge: 2018-04-16 | Disposition: A | Discharge status: Home / Self Care | Payer: BLUE CROSS/BLUE SHIELD | Source: Ambulatory Visit | Attending: Sports Medicine | Admitting: Sports Medicine

## 2018-04-16 ENCOUNTER — Other Ambulatory Visit: Payer: BLUE CROSS/BLUE SHIELD

## 2018-04-16 DIAGNOSIS — M25561 Pain in right knee: Secondary | ICD-10-CM

## 2018-04-21 ENCOUNTER — Telehealth: Payer: Self-pay | Admitting: Sports Medicine

## 2018-04-21 NOTE — Telephone Encounter (Signed)
  I spoke with Mary Cruz on the phone today after reviewing MRI findings of her right knee.  No discrete meniscal tear seen.  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  Arthritis is much more pronounced on the MRI than on her x-ray.  I discussed treatment with over-the-counter anti-inflammatories, ice as needed, and cortisone injections.  She will take a watchful waiting approach for now and if symptoms persist or worsen she may return to the office for possible cortisone injection.

## 2018-05-09 ENCOUNTER — Other Ambulatory Visit: Payer: Self-pay | Admitting: Nurse Practitioner
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
## 2018-05-21 ENCOUNTER — Other Ambulatory Visit: Payer: Self-pay

## 2018-05-21 ENCOUNTER — Telehealth: Payer: Self-pay | Admitting: *Deleted

## 2018-05-21 ENCOUNTER — Ambulatory Visit: Payer: BLUE CROSS/BLUE SHIELD | Attending: Nurse Practitioner | Admitting: Nurse Practitioner

## 2018-05-21 DIAGNOSIS — M159 Polyosteoarthritis, unspecified: Secondary | ICD-10-CM

## 2018-05-21 DIAGNOSIS — M47816 Spondylosis without myelopathy or radiculopathy, lumbar region: Secondary | ICD-10-CM | POA: Diagnosis not present

## 2018-05-21 DIAGNOSIS — G894 Chronic pain syndrome: Secondary | ICD-10-CM | POA: Diagnosis not present

## 2018-05-21 DIAGNOSIS — M792 Neuralgia and neuritis, unspecified: Secondary | ICD-10-CM | POA: Diagnosis not present

## 2018-05-21 DIAGNOSIS — M8949 Other hypertrophic osteoarthropathy, multiple sites: Secondary | ICD-10-CM

## 2018-05-21 DIAGNOSIS — M150 Primary generalized (osteo)arthritis: Secondary | ICD-10-CM

## 2018-05-21 DIAGNOSIS — M797 Fibromyalgia: Secondary | ICD-10-CM | POA: Diagnosis not present

## 2018-05-21 MED ORDER — OXYCODONE HCL 5 MG PO TABS: 5.0000 mg | ORAL_TABLET | Freq: Four times a day (QID) | ORAL | 0 refills | Status: DC | PRN

## 2018-05-21 MED ORDER — METHOCARBAMOL 750 MG PO TABS: 750.0000 mg | ORAL_TABLET | Freq: Three times a day (TID) | ORAL | 2 refills | Status: DC | PRN

## 2018-05-21 MED ORDER — OXYCODONE HCL 5 MG PO TABS: 5.0000 mg | ORAL_TABLET | ORAL | 0 refills | Status: DC | PRN

## 2018-05-21 MED ORDER — AMITRIPTYLINE HCL 100 MG PO TABS: ORAL_TABLET | ORAL | 2 refills | Status: DC

## 2018-05-21 NOTE — Progress Notes (Signed)
Pain Management Encounter Note - Virtual Visit via Telephone Telehealth (real-time audio visits between healthcare provider and patient).  Patient's Phone No. & Preferred Pharmacy:  [phone] (home); [phone] (mobile); (Preferred) [phone]  [id number] - [place], [place] - [address] [place] [address] Phone: [phone] Fax: [phone]   Pre-screening note:  Our staff contacted Mary Cruz and offered her an "in person", "face-to-face" appointment versus a telephone encounter. She indicated preferring the telephone encounter, at this time.  Reason for Virtual Visit: COVID-19*  Social distancing based on CDC and AMA recommendations.   I contacted Mary Cruz on 05/21/2018 at 9:30 AM by telephone and clearly identified myself as Dionisio David, NP. I verified that I was speaking with the correct person using two identifiers (Name and date of birth: 17-Aug-1975).  Advanced Informed Consent I sought verbal advanced consent from Mary Cruz for telemedicine interactions and virtual visit. I informed Mary Cruz of the security and privacy concerns, risks, and limitations associated with performing an evaluation and management service by telephone. I also informed Mary Cruz of the availability of "in person" appointments and I informed her of the possibility of a patient responsible charge related to this service. Mary Cruz expressed understanding and agreed to proceed.   Historic Elements   Mary Cruz is a 43 y.o. year old, female patient evaluated today after her last encounter by our practice on 05/09/2018. Mary Cruz  has a past medical history of Abscess of axillary fold (10/10/2013), Acute bronchitis with asthma with acute exacerbation (01/23/2015), Fibromyalgia, Hypertension, Lupus ([place]), Osteoarthritis, Pain, and Sepsis affecting skin (10/10/2013). She also  has a past surgical history that includes spinal cord stimulator;  Occipital nerve stimulator insertion; Cholecystectomy (2000); Knee surgery; Shoulder surgery; Tubal ligation (2000); Endometrial ablation; Spinal cord stimulator implant (2008); Knee surgery (Right, 02-2015); Breast surgery; and Spinal cord stimulator insertion (N/A, 06/21/2016). Mary Cruz has a current medication list which includes the following prescription(s): albuterol, amitriptyline, vitamin d3, clonazepam, diclofenac sodium, duloxetine, methocarbamol, naproxen-esomeprazole, omeprazole, oxycodone, oxycodone, oxycodone, prednisone, and quetiapine. She  reports that she has quit smoking. Her smoking use included cigarettes. She quit smokeless tobacco use about 5 months ago. She reports that she does not drink alcohol or use drugs. Mary Cruz is allergic to keflex [cephalexin]; amoxicillin; celebrex [celecoxib]; pentazocine lactate; bactrim [sulfamethoxazole-trimethoprim]; doxycycline; erythromycin; hydromorphone hcl; latex; and nabumetone.   HPI  I last saw her on 05/09/2018. She is being evaluated for medication management. She is having 3/10 low back and hip pain.  She has pain that goes into buttock and hip only. She is having numbness and tingling in her buttock. She is not having weakness.  She feels like the pain is about the same. She denies any side effects of the current medication. She denies any new concerns today. She admits that she is no longer taking the Lyrica secondary to sedation. She is doing ok without this.   Pharmacotherapy Assessment  Analgesic:Oxycodone IR 5 mg every 6 hours when necessary for pain. (20 mg/day) MME/day:30 mg/day.  Monitoring: Pharmacotherapy: No side-effects or adverse reactions reported. [place] PMP: PDMP reviewed during this encounter.       Compliance: No problems identified. Plan: Refer to "POC".  Review of recent tests  MR Knee Right Wo Contrast CLINICAL DATA:  Right knee pain for 1 year history of previous knee surgery 2018.  EXAM: MRI OF THE RIGHT  KNEE WITHOUT CONTRAST  TECHNIQUE: Multiplanar, multisequence MR imaging of the  knee was performed. No intravenous contrast was administered.  COMPARISON:  Radiographs 04/06/2018  FINDINGS: MENISCI  Medial meniscus:  Intrasubstance degenerative changes but no tear.  Lateral meniscus: Mild free edge fraying along the anterior horn but no discrete tear.  LIGAMENTS  Cruciates:  Intact  Collaterals:  Intact  CARTILAGE  Patellofemoral:  Moderate degenerative chondrosis.  Medial: Moderate to advanced degenerative chondrosis with joint space narrowing and spurring.  Lateral: Moderate degenerative chondrosis with early spurring changes.  Joint:  Moderate-sized joint effusion and mild synovitis.  Popliteal Fossa:  No popliteal mass or Baker's cyst.  Extensor Mechanism: The patella retinacular structures are intact and the quadriceps and patellar tendons are intact.  Bones: No acute bony findings. Patchy metaphyseal signal abnormality not uncommon and young females with this body habitus.  Other: Small amount of fluid tracking back along the popliteus tendon. The knee musculature appears normal.  IMPRESSION: 1. Meniscal degenerative changes but no discrete meniscal tear. 2. Intact ligamentous structures and no acute bony findings. 3. Age advanced tricompartmental degenerative chondrosis. 4. Moderate-sized joint effusion and mild synovitis.  Electronically Signed   By: Marijo Sanes M.D.   On: 04/16/2018 14:29   Clinical Support on 02/13/2018  Component Date Value Ref Range Status  . Summary 02/13/2018 FINAL   Final   Comment: ==================================================================== TOXASSURE SELECT 13 (MW) ==================================================================== Test                             Result       Flag       Units Drug Present and Declared for Prescription Verification   Oxycodone                      2035         EXPECTED   ng/mg  creat   Oxymorphone                    564          EXPECTED   ng/mg creat   Noroxycodone                   1930         EXPECTED   ng/mg creat   Noroxymorphone                 142          EXPECTED   ng/mg creat    Sources of oxycodone are scheduled prescription medications.    Oxymorphone, noroxycodone, and noroxymorphone are expected    metabolites of oxycodone. Oxymorphone is also available as a    scheduled prescription medication. Drug Absent but Declared for Prescription Verification   Clonazepam                     Not Detected UNEXPECTED ng/mg creat ==================================================================== Test                                                Result    Flag   Units      Ref Range   Creatinine              103              mg/dL      >=20 ====================================================================  Declared Medications:  The flagging and interpretation on this report are based on the  following declared medications.  Unexpected results may arise from  inaccuracies in the declared medications.  **Note: The testing scope of this panel includes these medications:  Clonazepam (Klonopin)  Oxycodone (Roxicodone)  **Note: The testing scope of this panel does not include following  reported medications:  Albuterol  Amitriptyline (Elavil)  Cholecalciferol  Diclofenac  Duloxetine (Cymbalta)  Esomeprazole  Methocarbamol (Robaxin)  Naproxen  Omeprazole (Prilosec)  Prednisone  Pregabalin (Lyrica)  Quetiapine (Seroquel) ==================================================================== For clinical consultation, please call [phone]. ===========================================                          =========================    Assessment  The primary encounter diagnosis was Lumbar spondylosis. Diagnoses of Chronic pain syndrome, Neurogenic pain, Fibromyalgia, and Primary osteoarthritis involving multiple joints were also pertinent to  this visit.  Plan of Care  I have discontinued Mary Cruz. Mary Cruz pregabalin. I have also changed her oxyCODONE, oxyCODONE, and oxyCODONE. Additionally, I am having her maintain her Vitamin D3, albuterol, omeprazole, clonazePAM, Diclofenac Sodium, QUEtiapine, predniSONE, Naproxen-Esomeprazole, DULoxetine, amitriptyline, and methocarbamol.  Pharmacotherapy (Medications Ordered): Meds ordered this encounter  Medications  . amitriptyline (ELAVIL) 100 MG tablet    Sig: TAKE 1 TABLET (100 MG TOTAL) BY MOUTH AT BEDTIME.    Dispense:  90 tablet    Refill:  2    Order Specific Question:   Supervising Provider    Answer:   Milinda Pointer [phone]  . methocarbamol (ROBAXIN) 750 MG tablet    Sig: Take 1 tablet (750 mg total) by mouth every 8 (eight) hours as needed for muscle spasms.    Dispense:  90 tablet    Refill:  2    Do not place this medication, or any other prescription from our practice, on "Automatic Refill". Patient may have prescription filled one day early if pharmacy is closed on scheduled refill date.    Order Specific Question:   Supervising Provider    Answer:   Milinda Pointer [phone]  . oxyCODONE (OXY IR/ROXICODONE) 5 MG immediate release tablet    Sig: Take 1 tablet (5 mg total) by mouth every 6 (six) hours as needed for up to 30 days for severe pain.    Dispense:  120 tablet    Refill:  0    Do not add this medication to the electronic "Automatic Refill" notification system. Patient may have prescription filled one day early if pharmacy is closed on scheduled refill date.    Order Specific Question:   Supervising Provider    Answer:   Milinda Pointer [phone]  . oxyCODONE (OXY IR/ROXICODONE) 5 MG immediate release tablet    Sig: Take 1 tablet (5 mg total) by mouth every 4 (four) hours as needed for up to 30 days for severe pain.    Dispense:  120 tablet    Refill:  0    Do not add this medication to the electronic "Automatic Refill" notification system. Patient  may have prescription filled one day early if pharmacy is closed on scheduled refill date.    Order Specific Question:   Supervising Provider    Answer:   Milinda Pointer [phone]  . oxyCODONE (OXY IR/ROXICODONE) 5 MG immediate release tablet    Sig: Take 1 tablet (5 mg total) by mouth every 6 (six) hours as needed for up to 30 days for severe pain.    Dispense:  120  tablet    Refill:  0    Do not add this medication to the electronic "Automatic Refill" notification system. Patient may have prescription filled one day early if pharmacy is closed on scheduled refill date.    Order Specific Question:   Supervising Provider    Answer:   Milinda Pointer [phone]   Orders:  No orders of the defined types were placed in this encounter.  Follow-up plan:   Return in about 3 months (around 08/20/2018) for MedMgmt.   I discussed the assessment and treatment plan with the patient. The patient was provided an opportunity to ask questions and all were answered. The patient agreed with the plan and demonstrated an understanding of the instructions.  Patient advised to call back or seek an in-person evaluation if the symptoms or condition worsens.  Total duration of non-face-to-face encounter: 11 minutes.  Note by: Dionisio David, NP  Disclaimer:  * Given the special circumstances of the COVID-19 pandemic, the federal government has announced that the Office for Civil Rights (OCR) will exercise its enforcement discretion and will not impose penalties on physicians using telehealth in the event of noncompliance with regulatory requirements under the [place] and [place] (HIPAA) in connection with the good faith provision of telehealth during the XFGHW-29 national public health emergency. ([place])

## 2018-06-22 ENCOUNTER — Other Ambulatory Visit: Payer: Self-pay | Admitting: Osteopathic Medicine

## 2018-06-22 DIAGNOSIS — F32A Depression, unspecified: Secondary | ICD-10-CM

## 2018-06-22 DIAGNOSIS — F329 Major depressive disorder, single episode, unspecified: Secondary | ICD-10-CM

## 2018-07-23 ENCOUNTER — Ambulatory Visit: Payer: BLUE CROSS/BLUE SHIELD | Admitting: Sports Medicine

## 2018-07-24 ENCOUNTER — Emergency Department (HOSPITAL_COMMUNITY)
Admission: EM | Admit: 2018-07-24 | Discharge: 2018-07-24 | Disposition: A | Discharge status: Home / Self Care | Payer: BLUE CROSS/BLUE SHIELD | Attending: Emergency Medicine | Admitting: Emergency Medicine

## 2018-07-24 ENCOUNTER — Emergency Department (HOSPITAL_COMMUNITY): Payer: BLUE CROSS/BLUE SHIELD

## 2018-07-24 ENCOUNTER — Encounter (HOSPITAL_COMMUNITY): Payer: Self-pay

## 2018-07-24 ENCOUNTER — Other Ambulatory Visit: Payer: Self-pay

## 2018-07-24 DIAGNOSIS — Z87891 Personal history of nicotine dependence: Secondary | ICD-10-CM | POA: Insufficient documentation

## 2018-07-24 DIAGNOSIS — Y9352 Activity, horseback riding: Secondary | ICD-10-CM | POA: Diagnosis not present

## 2018-07-24 DIAGNOSIS — Y999 Unspecified external cause status: Secondary | ICD-10-CM | POA: Insufficient documentation

## 2018-07-24 DIAGNOSIS — S4981XA Other specified injuries of right shoulder and upper arm, initial encounter: Secondary | ICD-10-CM | POA: Diagnosis present

## 2018-07-24 DIAGNOSIS — I10 Essential (primary) hypertension: Secondary | ICD-10-CM | POA: Diagnosis not present

## 2018-07-24 DIAGNOSIS — S42101A Fracture of unspecified part of scapula, right shoulder, initial encounter for closed fracture: Secondary | ICD-10-CM | POA: Diagnosis not present

## 2018-07-24 DIAGNOSIS — Z79899 Other long term (current) drug therapy: Secondary | ICD-10-CM | POA: Diagnosis not present

## 2018-07-24 DIAGNOSIS — Y929 Unspecified place or not applicable: Secondary | ICD-10-CM | POA: Diagnosis not present

## 2018-07-24 MED ORDER — MORPHINE SULFATE (PF) 4 MG/ML IV SOLN
8.0000 mg | Freq: Once | INTRAVENOUS | Status: AC
  Administered 2018-07-24: 8 mg via INTRAMUSCULAR
  Filled 2018-07-24: qty 2

## 2018-07-24 MED ORDER — OXYCODONE-ACETAMINOPHEN 5-325 MG PO TABS: 1.0000 | ORAL_TABLET | Freq: Four times a day (QID) | ORAL | 0 refills | Status: DC | PRN
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
## 2018-07-25 NOTE — ED Provider Notes (Signed)
[facility] DEPT Provider Note   CSN: [id number] Arrival date & time: 07/24/18  1906     History   Chief Complaint Chief Complaint  Patient presents with  . Fall    thrown from horse  . Shoulder Pain    HPI Mary Cruz is a 43 y.o. female.     HPI   71yF with R shoulder/scapular pain. Fell from galloping horse just prior to arrival. Wasn't wearing helmet but says she didn't hit head head. Denies HA, neck or back pain. Has been ambulatory and I watched her walk in the ED back to her room. Severe pain when she tried to move R shoulder. Denies any other injuries.   Past Medical History:  Diagnosis Date  . Abscess of axillary fold 10/10/2013  . Acute bronchitis with asthma with acute exacerbation 01/23/2015  . Fibromyalgia   . Hypertension   . Lupus ([place])   . Osteoarthritis   . Pain    chronic regional pain syndrome  . Sepsis affecting skin 10/10/2013    Patient Active Problem List   Diagnosis Date Noted  . Pain in right knee 03/19/2017  . Family history of thyroid disease 10/24/2016  . Neurogenic pain 10/10/2016  . Fibromyalgia 10/10/2016  . Spinal cord stimulator status 05/16/2016  . Battery end of life of spinal cord stimulator 05/16/2016  . Positive ANA (antinuclear antibody) 09/13/2015  . Lumbar spondylosis 06/29/2015  . Lumbar facet syndrome (Bilateral) (R>L) 06/29/2015  . Implant pain at battery site (right buttocks area) 04/05/2015  . Anxiety and depression 01/20/2015  . Right knee pain 01/10/2015  . Long term current use of opiate analgesic 12/19/2014  . Long term prescription opiate use 12/19/2014  . Opiate use (30 MME/Day) 12/19/2014  . Opiate dependence ([place]) 12/19/2014  . Encounter for therapeutic drug level monitoring 12/19/2014  . Musculoskeletal pain 12/19/2014  . Muscle cramping 12/19/2014  . Osteoarthrosis 12/19/2014  . Vitamin D insufficiency 12/19/2014  . Presence of functional implant ([employer]tronic Lumbar  spinal cord stimulator implant) 12/19/2014  . Encounter for management of implanted device 12/19/2014  . Chronic low back pain (Bilateral) (R>L) 12/19/2014  . Chronic lumbar radicular pain (Right L5 dermatome; Left S1 Dermatome) (Bilateral) (R>L) 12/19/2014  . Chronic hip pain (Location of Tertiary source of pain) (Bilateral) (R>L) 12/19/2014  . Chronic neck pain (Location of Primary Source of Pain) (Bilateral) (L>R) 12/19/2014  . Chronic upper extremity pain (Location of Secondary source of pain) (Bilateral) (L>R) 12/19/2014  . Morbid obesity ([place]) 12/19/2014  . Nicotine dependence 12/19/2014  . Chronic cervical radicular pain (C7 Dermatome) (Bilateral) (L>R) 12/19/2014  . Chronic lower extremity pain (Bilateral) (R>L) 12/19/2014  . Obesity 10/27/2014  . Generalized anxiety disorder 03/16/2014  . Essential hypertension, benign 07/09/2013  . Chronic pain syndrome 07/09/2013  . GERD 07/02/2007  . Irritable bowel syndrome 07/02/2007  . KIDNEY STONES 07/02/2007    Past Surgical History:  Procedure Laterality Date  . BREAST SURGERY    . CHOLECYSTECTOMY  2000  . ENDOMETRIAL ABLATION    . KNEE SURGERY    . KNEE SURGERY Right 02-2015  . OCCIPITAL NERVE STIMULATOR INSERTION    . SHOULDER SURGERY    . spinal cord stimulator    . SPINAL CORD STIMULATOR IMPLANT  2008  . SPINAL CORD STIMULATOR INSERTION N/A 06/21/2016   Procedure: LUMBAR SPINAL CORD STIMULATOR INSERTION;  Surgeon: Clydell Hakim, MD;  Location: [facility];  Service: Neurosurgery;  Laterality: N/A;  LUMBAR SPINAL CORD STIMULATOR INSERTION  .  TUBAL LIGATION  2000     OB History   No obstetric history on file.      Home Medications    Prior to Admission medications   Medication Sig Start Date End Date Taking? Authorizing Provider  albuterol (PROVENTIL HFA;VENTOLIN HFA) 108 (90 Base) MCG/ACT inhaler Inhale 1-2 puffs into the lungs every 6 (six) hours as needed for wheezing or shortness of breath. 03/04/16   Fransico Meadow, PA-C   amitriptyline (ELAVIL) 100 MG tablet TAKE 1 TABLET (100 MG TOTAL) BY MOUTH AT BEDTIME. 05/27/18   Vevelyn Francois, NP  Cholecalciferol (VITAMIN D3) 2000 units TABS Take 1 tablet by mouth daily.    [provider]  clonazePAM (KLONOPIN) 0.5 MG tablet Take 0.5-1 tablets (0.25-0.5 mg total) by mouth 2 (two) times daily as needed for anxiety. 09/15/17   Emeterio Reeve, DO  Diclofenac Sodium (PENNSAID) 2 % SOLN Pennsaid 20 mg/gram/actuation (2 %) topical soln in metered-dose pump  APPLY 2 (TWO) PUMPS TOPICALLY TO AFFECTED AREA(S) ON KNEES TWICE A DAY    [provider]  DULoxetine (CYMBALTA) 60 MG capsule Take 1 capsule (60 mg total) by mouth 2 (two) times daily. 01/28/18   Emeterio Reeve, DO  methocarbamol (ROBAXIN) 750 MG tablet Take 1 tablet (750 mg total) by mouth every 8 (eight) hours as needed for muscle spasms. 05/27/18 08/25/18  Vevelyn Francois, NP  Naproxen-Esomeprazole (VIMOVO) 500-20 MG TBEC Take by mouth.    [provider]  omeprazole (PRILOSEC) 40 MG capsule Take 1 capsule (40 mg total) by mouth daily. 10/24/16   Emeterio Reeve, DO  oxyCODONE (OXY IR/ROXICODONE) 5 MG immediate release tablet Take 1 tablet (5 mg total) by mouth every 6 (six) hours as needed for up to 30 days for severe pain. 06/26/18 07/26/18  Vevelyn Francois, NP  oxyCODONE (OXY IR/ROXICODONE) 5 MG immediate release tablet Take 1 tablet (5 mg total) by mouth every 4 (four) hours as needed for up to 30 days for severe pain. 07/26/18 08/25/18  Vevelyn Francois, NP  oxyCODONE (OXY IR/ROXICODONE) 5 MG immediate release tablet Take 1 tablet (5 mg total) by mouth every 6 (six) hours as needed for up to 30 days for severe pain. 05/27/18 06/26/18  Vevelyn Francois, NP  oxyCODONE-acetaminophen (PERCOCET/ROXICET) 5-325 MG tablet Take 1 tablet by mouth every 6 (six) hours as needed for severe pain. 07/24/18   Virgel Manifold, MD  predniSONE (RAYOS) 5 MG TBEC Take by mouth.    [provider]  QUEtiapine  (SEROQUEL) 25 MG tablet quetiapine 25 mg tablet    [provider]    Family History Family History  Problem Relation Age of Onset  . Depression Mother        maternal grandmother  . Asthma Mother   . Fibromyalgia Other        aunt  . Stroke Other        grandmother  . Thyroid disease Other        grandmother    Social History Social History   Tobacco Use  . Smoking status: Former Smoker    Types: Cigarettes  . Smokeless tobacco: Former Systems developer    Quit date: 12/22/2017  Substance Use Topics  . Alcohol use: No    Alcohol/week: 0.0 standard drinks  . Drug use: No     Allergies   Keflex [cephalexin], Amoxicillin, Celebrex [celecoxib], Pentazocine lactate, Bactrim [sulfamethoxazole-trimethoprim], Doxycycline, Erythromycin, Hydromorphone hcl, Latex, and Nabumetone   Review of Systems Review of  Systems  All systems reviewed and negative, other than as noted in HPI.  Physical Exam Updated Vital Signs BP 136/89   Pulse 97   Temp 98.6 F (37 C) (Oral)   Resp 18   SpO2 99%   Physical Exam Vitals signs and nursing note reviewed.  Constitutional:      General: She is not in acute distress.    Appearance: She is well-developed.  HENT:     Head: Normocephalic and atraumatic.  Eyes:     General:        Right eye: No discharge.        Left eye: No discharge.     Conjunctiva/sclera: Conjunctivae normal.  Neck:     Musculoskeletal: Neck supple.  Cardiovascular:     Rate and Rhythm: Normal rate and regular rhythm.     Heart sounds: Normal heart sounds. No murmur. No friction rub. No gallop.   Pulmonary:     Effort: Pulmonary effort is normal. No respiratory distress.     Breath sounds: Normal breath sounds.  Abdominal:     General: There is no distension.     Palpations: Abdomen is soft.     Tenderness: There is no abdominal tenderness.  Musculoskeletal:        General: Tenderness present.     Comments: Superficial abrasions to R shoulder/scapular  region. Pain with attempted ROM of R shoulder and limited by pain. NVI. No midline spinal tenderness.   Skin:    General: Skin is warm and dry.  Neurological:     Mental Status: She is alert.  Psychiatric:        Behavior: Behavior normal.        Thought Content: Thought content normal.      ED Treatments / Results  Labs (all labs ordered are listed, but only abnormal results are displayed) Labs Reviewed - No data to display  EKG    Radiology Dg Shoulder Right  Result Date: 07/24/2018 CLINICAL DATA:  Pt reports that she was thrown from her horse at full gallop around 5p. Reports R shoulder and scapular pain where she landed. No obvious bruising on her back or flanks. Best images obtained due to pt pain. Pt unable to perform.*comment was truncated*fall from horse EXAM: RIGHT SHOULDER - 2+ VIEW COMPARISON:  None. FINDINGS: Glenohumeral joint is intact. The acromioclavicular joint is intact. Along the inferolateral margin of the body of the scapula there is a linear lucency perpendicular to the margin of the scapula evident on two views. This lesion is approximately 2 cm medial to the glenoid fossa. IMPRESSION: Minimally displaced fracture of the body of the scapula. No shoulder dislocation. Electronically Signed   By: Suzy Bouchard M.D.   On: 07/24/2018 20:08    Procedures Procedures (including critical care time)  Medications Ordered in ED Medications  morphine 4 MG/ML injection 8 mg (8 mg Intramuscular Given 07/24/18 2001)     Initial Impression / Assessment and Plan / ED Course  I have reviewed the triage vital signs and the nursing notes.  Pertinent labs & imaging results that were available during my care of the patient were reviewed by me and considered in my medical decision making (see chart for details).  42yF with closed R scapular fx. NVI. No midline spinal tenderness. Didn't hit head and denies HA. Sling. PRN pain meds. Ortho FU.   Final Clinical Impressions(s) /  ED Diagnoses   Final diagnoses:  Closed fracture of right scapula, unspecified part of  scapula, initial encounter    ED Discharge Orders         Ordered    oxyCODONE-acetaminophen (PERCOCET/ROXICET) 5-325 MG tablet  Every 6 hours PRN     07/24/18 2050           Virgel Manifold, MD 07/25/18 1657

## 2018-08-04 ENCOUNTER — Ambulatory Visit (INDEPENDENT_AMBULATORY_CARE_PROVIDER_SITE_OTHER): Payer: BLUE CROSS/BLUE SHIELD | Admitting: Osteopathic Medicine

## 2018-08-04 ENCOUNTER — Encounter: Payer: Self-pay | Admitting: Osteopathic Medicine

## 2018-08-04 VITALS — Wt 260.0 lb

## 2018-08-04 DIAGNOSIS — I10 Essential (primary) hypertension: Secondary | ICD-10-CM

## 2018-08-04 DIAGNOSIS — M7918 Myalgia, other site: Secondary | ICD-10-CM

## 2018-08-04 DIAGNOSIS — F329 Major depressive disorder, single episode, unspecified: Secondary | ICD-10-CM

## 2018-08-04 DIAGNOSIS — F419 Anxiety disorder, unspecified: Secondary | ICD-10-CM | POA: Diagnosis not present

## 2018-08-04 DIAGNOSIS — F32A Depression, unspecified: Secondary | ICD-10-CM

## 2018-08-04 DIAGNOSIS — M797 Fibromyalgia: Secondary | ICD-10-CM

## 2018-08-04 MED ORDER — DULOXETINE HCL 60 MG PO CPEP: 60.0000 mg | ORAL_CAPSULE | Freq: Two times a day (BID) | ORAL | 1 refills | Status: DC

## 2018-08-04 MED ORDER — CLONAZEPAM 0.5 MG PO TABS: 0.2500 mg | ORAL_TABLET | Freq: Two times a day (BID) | ORAL | 0 refills | Status: DC | PRN

## 2018-08-04 MED ORDER — LISINOPRIL-HYDROCHLOROTHIAZIDE 10-12.5 MG PO TABS: 0.5000 | ORAL_TABLET | Freq: Every day | ORAL | 1 refills | Status: DC

## 2018-08-04 NOTE — Progress Notes (Signed)
Virtual Visit via Phone Note  I connected with      Mary Cruz on 08/04/18 at 1:30 by a telemedicine application and verified that I am speaking with the correct person using two identifiers.  Patient is at work I am at office    I discussed the limitations of evaluation and management by telemedicine and the availability of in person appointments. The patient expressed understanding and agreed to proceed.  History of Present Illness: Mary Cruz is a 43 y.o. female who would like to discuss BP elevated    BP has been diastolic in the 154'M, and systolic 086'P would like to get back on Lisinopril-HCT BP medications.        Observations/Objective: Wt 260 lb (117.9 kg)   BMI 43.94 kg/m  BP Readings from Last 3 Encounters:  07/24/18 136/89  04/06/18 (!) 163/97  02/13/18 (!) 136/100   Exam: Normal Speech.    Lab and Radiology Results No results found for this or any previous visit (from the past 72 hour(s)). No results found.     Assessment and Plan: 43 y.o. female with The primary encounter diagnosis was Essential hypertension, benign. Diagnoses of Anxiety and depression, Musculoskeletal pain, and Fibromyalgia were also pertinent to this visit.   PDMP not reviewed this encounter. Orders Placed This Encounter  Procedures  . COMPLETE METABOLIC PANEL WITH GFR  . CBC  . TSH  . Lipid panel   Meds ordered this encounter  Medications  . clonazePAM (KLONOPIN) 0.5 MG tablet    Sig: Take 0.5-1 tablets (0.25-0.5 mg total) by mouth 2 (two) times daily as needed for anxiety.    Dispense:  30 tablet    Refill:  0  . DULoxetine (CYMBALTA) 60 MG capsule    Sig: Take 1 capsule (60 mg total) by mouth 2 (two) times daily.    Dispense:  180 capsule    Refill:  1  . lisinopril-hydrochlorothiazide (ZESTORETIC) 10-12.5 MG tablet    Sig: Take 0.5 tablets by mouth daily.    Dispense:  45 tablet    Refill:  1   There are no Patient Instructions on file for  this visit.  Instructions sent via MyChart. If MyChart not available, pt was given option for info via personal e-mail w/ no guarantee of protected health info over unsecured e-mail communication, and MyChart sign-up instructions were included.   Follow Up Instructions: Return in about 1 week (around 08/11/2018) for nurse visit sometime this week or next to verify home BP monitor, also lab visit. .    I discussed the assessment and treatment plan with the patient. The patient was provided an opportunity to ask questions and all were answered. The patient agreed with the plan and demonstrated an understanding of the instructions.   The patient was advised to call back or seek an in-person evaluation if any new concerns, if symptoms worsen or if the condition fails to improve as anticipated.  15 minutes of non-face-to-face time was provided during this encounter.                      Historical information moved to improve visibility of documentation.  Past Medical History:  Diagnosis Date  . Abscess of axillary fold 10/10/2013  . Acute bronchitis with asthma with acute exacerbation 01/23/2015  . Fibromyalgia   . Hypertension   . Lupus ([place])   . Osteoarthritis   . Pain    chronic regional pain syndrome  .  Sepsis affecting skin 10/10/2013   Past Surgical History:  Procedure Laterality Date  . BREAST SURGERY    . CHOLECYSTECTOMY  2000  . ENDOMETRIAL ABLATION    . KNEE SURGERY    . KNEE SURGERY Right 02-2015  . OCCIPITAL NERVE STIMULATOR INSERTION    . SHOULDER SURGERY    . spinal cord stimulator    . SPINAL CORD STIMULATOR IMPLANT  2008  . SPINAL CORD STIMULATOR INSERTION N/A 06/21/2016   Procedure: LUMBAR SPINAL CORD STIMULATOR INSERTION;  Surgeon: Clydell Hakim, MD;  Location: [facility];  Service: Neurosurgery;  Laterality: N/A;  LUMBAR SPINAL CORD STIMULATOR INSERTION  . TUBAL LIGATION  2000   Social History   Tobacco Use  . Smoking status: Current Every Day  Smoker    Types: Cigarettes  . Smokeless tobacco: Former Systems developer    Quit date: 12/22/2017  . Tobacco comment: as per pt - smokes 3-4 cigs daily  Substance Use Topics  . Alcohol use: No    Alcohol/week: 0.0 standard drinks   family history includes Asthma in her mother; Depression in her mother; Fibromyalgia in an other family member; Stroke in an other family member; Thyroid disease in an other family member.  Medications: Current Outpatient Medications  Medication Sig Dispense Refill  . albuterol (PROVENTIL HFA;VENTOLIN HFA) 108 (90 Base) MCG/ACT inhaler Inhale 1-2 puffs into the lungs every 6 (six) hours as needed for wheezing or shortness of breath. 1 Inhaler 0  . amitriptyline (ELAVIL) 100 MG tablet TAKE 1 TABLET (100 MG TOTAL) BY MOUTH AT BEDTIME. 90 tablet 2  . Cholecalciferol (VITAMIN D3) 2000 units TABS Take 1 tablet by mouth daily.    . clonazePAM (KLONOPIN) 0.5 MG tablet Take 0.5-1 tablets (0.25-0.5 mg total) by mouth 2 (two) times daily as needed for anxiety. 30 tablet 0  . Diclofenac Sodium (PENNSAID) 2 % SOLN Pennsaid 20 mg/gram/actuation (2 %) topical soln in metered-dose pump  APPLY 2 (TWO) PUMPS TOPICALLY TO AFFECTED AREA(S) ON KNEES TWICE A DAY    . DULoxetine (CYMBALTA) 60 MG capsule Take 1 capsule (60 mg total) by mouth 2 (two) times daily. 180 capsule 1  . methocarbamol (ROBAXIN) 750 MG tablet Take 1 tablet (750 mg total) by mouth every 8 (eight) hours as needed for muscle spasms. 90 tablet 2  . Naproxen-Esomeprazole (VIMOVO) 500-20 MG TBEC Take by mouth.    [name] omeprazole (PRILOSEC) 40 MG capsule Take 1 capsule (40 mg total) by mouth daily. 90 capsule 3  . oxyCODONE (OXY IR/ROXICODONE) 5 MG immediate release tablet Take 1 tablet (5 mg total) by mouth every 6 (six) hours as needed for up to 30 days for severe pain. 120 tablet 0  . QUEtiapine (SEROQUEL) 25 MG tablet quetiapine 25 mg tablet    . lisinopril-hydrochlorothiazide (ZESTORETIC) 10-12.5 MG tablet Take 0.5 tablets by  mouth daily. 45 tablet 1  . oxyCODONE (OXY IR/ROXICODONE) 5 MG immediate release tablet Take 1 tablet (5 mg total) by mouth every 4 (four) hours as needed for up to 30 days for severe pain. 120 tablet 0  . oxyCODONE (OXY IR/ROXICODONE) 5 MG immediate release tablet Take 1 tablet (5 mg total) by mouth every 6 (six) hours as needed for up to 30 days for severe pain. 120 tablet 0  . oxyCODONE-acetaminophen (PERCOCET/ROXICET) 5-325 MG tablet Take 1 tablet by mouth every 6 (six) hours as needed for severe pain. (Patient not taking: Reported on 08/04/2018) 20 tablet 0  . predniSONE (RAYOS) 5 MG TBEC Take by  mouth.     No current facility-administered medications for this visit.    Allergies  Allergen Reactions  . Keflex [Cephalexin] Other (See Comments)    Nausea and causes uti's  . Amoxicillin Diarrhea and Nausea And Vomiting  . Celebrex [Celecoxib] Swelling    "Body swelling"  . Pentazocine Lactate Itching and Other (See Comments)    HALLUCINATIONS  . Bactrim [Sulfamethoxazole-Trimethoprim] Other (See Comments)    "Makes very sick"  . Doxycycline     UNSPECIFIED REACTION  "SICKNESS"  . Erythromycin Other (See Comments)    RESULTANT UTI  . Hydromorphone Hcl Other (See Comments)    HALLUCINATIONS  . Latex Itching  . Nabumetone Itching    PDMP not reviewed this encounter. Orders Placed This Encounter  Procedures  . COMPLETE METABOLIC PANEL WITH GFR  . CBC  . TSH  . Lipid panel   Meds ordered this encounter  Medications  . clonazePAM (KLONOPIN) 0.5 MG tablet    Sig: Take 0.5-1 tablets (0.25-0.5 mg total) by mouth 2 (two) times daily as needed for anxiety.    Dispense:  30 tablet    Refill:  0  . DULoxetine (CYMBALTA) 60 MG capsule    Sig: Take 1 capsule (60 mg total) by mouth 2 (two) times daily.    Dispense:  180 capsule    Refill:  1  . lisinopril-hydrochlorothiazide (ZESTORETIC) 10-12.5 MG tablet    Sig: Take 0.5 tablets by mouth daily.    Dispense:  45 tablet    Refill:   1

## 2018-08-06 ENCOUNTER — Telehealth: Payer: Self-pay

## 2018-08-06 NOTE — Telephone Encounter (Signed)
-----   Message from Emeterio Reeve, DO sent at 08/04/2018  2:10 PM EDT ----- nurse visit sometime this week or next to verify home BP monitor, also lab visit.

## 2018-08-13 ENCOUNTER — Ambulatory Visit: Payer: BLUE CROSS/BLUE SHIELD

## 2018-08-19 ENCOUNTER — Telehealth: Payer: Self-pay

## 2018-08-20 ENCOUNTER — Encounter: Payer: BLUE CROSS/BLUE SHIELD | Admitting: Student in an Organized Health Care Education/Training Program

## 2018-08-20 ENCOUNTER — Ambulatory Visit: Payer: BLUE CROSS/BLUE SHIELD | Attending: Nurse Practitioner | Admitting: Pain Medicine

## 2018-08-20 ENCOUNTER — Other Ambulatory Visit: Payer: Self-pay

## 2018-08-20 DIAGNOSIS — Z79899 Other long term (current) drug therapy: Secondary | ICD-10-CM | POA: Insufficient documentation

## 2018-08-20 DIAGNOSIS — G894 Chronic pain syndrome: Secondary | ICD-10-CM

## 2018-08-20 DIAGNOSIS — M79601 Pain in right arm: Secondary | ICD-10-CM

## 2018-08-20 DIAGNOSIS — G8929 Other chronic pain: Secondary | ICD-10-CM

## 2018-08-20 DIAGNOSIS — M797 Fibromyalgia: Secondary | ICD-10-CM

## 2018-08-20 DIAGNOSIS — M25551 Pain in right hip: Secondary | ICD-10-CM

## 2018-08-20 DIAGNOSIS — R252 Cramp and spasm: Secondary | ICD-10-CM

## 2018-08-20 DIAGNOSIS — M899 Disorder of bone, unspecified: Secondary | ICD-10-CM

## 2018-08-20 DIAGNOSIS — Z789 Other specified health status: Secondary | ICD-10-CM | POA: Insufficient documentation

## 2018-08-20 DIAGNOSIS — M542 Cervicalgia: Secondary | ICD-10-CM | POA: Diagnosis not present

## 2018-08-20 DIAGNOSIS — Z969 Presence of functional implant, unspecified: Secondary | ICD-10-CM

## 2018-08-20 DIAGNOSIS — M25552 Pain in left hip: Secondary | ICD-10-CM

## 2018-08-20 DIAGNOSIS — M792 Neuralgia and neuritis, unspecified: Secondary | ICD-10-CM

## 2018-08-20 DIAGNOSIS — M79602 Pain in left arm: Secondary | ICD-10-CM

## 2018-08-20 DIAGNOSIS — E559 Vitamin D deficiency, unspecified: Secondary | ICD-10-CM

## 2018-08-20 DIAGNOSIS — M7918 Myalgia, other site: Secondary | ICD-10-CM

## 2018-08-20 DIAGNOSIS — S42101S Fracture of unspecified part of scapula, right shoulder, sequela: Secondary | ICD-10-CM | POA: Insufficient documentation

## 2018-08-20 HISTORY — DX: Disorder of bone, unspecified: M89.9

## 2018-08-20 HISTORY — DX: Other long term (current) drug therapy: Z79.899

## 2018-08-20 HISTORY — DX: Other chronic pain: G89.29

## 2018-08-20 MED ORDER — OXYCODONE HCL 5 MG PO TABS: 5.0000 mg | ORAL_TABLET | Freq: Four times a day (QID) | ORAL | 0 refills | Status: DC | PRN

## 2018-08-20 MED ORDER — METHOCARBAMOL 750 MG PO TABS: 750.0000 mg | ORAL_TABLET | Freq: Three times a day (TID) | ORAL | 2 refills | Status: DC | PRN

## 2018-08-20 MED ORDER — AMITRIPTYLINE HCL 100 MG PO TABS: 100.0000 mg | ORAL_TABLET | Freq: Every day | ORAL | 2 refills | Status: DC

## 2018-08-20 NOTE — Progress Notes (Signed)
Pain Management Virtual Encounter Note - Virtual Visit via Telephone Telehealth (real-time audio visits between healthcare provider and patient).   Patient's Phone No. & Preferred Pharmacy:  [phone] (home); [phone] (mobile); (Preferred) [phone] [email or web address]  [id number] - [place], [place] - [address] [place] [address] Phone: [phone] Fax: [phone]    Pre-screening note:  Our staff contacted Ms. Beauchesne and offered her an "in person", "face-to-face" appointment versus a telephone encounter. She indicated preferring the telephone encounter, at this time.   Reason for Virtual Visit: COVID-19*  Social distancing based on CDC and AMA recommendations.   I contacted JAYSHA LASURE on 08/20/2018 via telephone.      I clearly identified myself as Gaspar Cola, MD. I verified that I was speaking with the correct person using two identifiers (Name: AYLEEN MCKINSTRY, and date of birth: 1975/07/14).  Advanced Informed Consent I sought verbal advanced consent from Emelia Loron for virtual visit interactions. I informed Ms. Wesche of possible security and privacy concerns, risks, and limitations associated with providing "not-in-person" medical evaluation and management services. I also informed Ms. Langlinais of the availability of "in-person" appointments. Finally, I informed her that there would be a charge for the virtual visit and that she could be  personally, fully or partially, financially responsible for it. Ms. Riebe expressed understanding and agreed to proceed.   Historic Elements   Ms. LAURINE KUYPER is a 43 y.o. year old, female patient evaluated today after her last encounter by our practice on 08/19/2018. Ms. Hammontree  has a past medical history of Abscess of axillary fold (10/10/2013), Acute bronchitis with asthma with acute exacerbation (01/23/2015), Fibromyalgia, Hypertension, Lupus ([place]), Osteoarthritis,  Pain, and Sepsis affecting skin (10/10/2013). She also  has a past surgical history that includes spinal cord stimulator; Occipital nerve stimulator insertion; Cholecystectomy (2000); Knee surgery; Shoulder surgery; Tubal ligation (2000); Endometrial ablation; Spinal cord stimulator implant (2008); Knee surgery (Right, 02-2015); Breast surgery; and Spinal cord stimulator insertion (N/A, 06/21/2016). Ms. Belflower has a current medication list which includes the following prescription(s): albuterol, amitriptyline, vitamin d3, clonazepam, diclofenac sodium, duloxetine, lisinopril-hydrochlorothiazide, methocarbamol, naproxen-esomeprazole, omeprazole, oxycodone, oxycodone, oxycodone, prednisone, and quetiapine. She  reports that she has been smoking cigarettes. She quit smokeless tobacco use about 7 months ago. She reports that she does not drink alcohol or use drugs. Ms. Cohill is allergic to keflex [cephalexin]; amoxicillin; celebrex [celecoxib]; pentazocine lactate; bactrim [sulfamethoxazole-trimethoprim]; doxycycline; erythromycin; hydromorphone hcl; latex; and nabumetone.   HPI  Today, she is being contacted for medication management.  The patient apparently was doing well until 07/24/2018 when she fell from her horse and fractured her right scapula when she landed on her right shoulder.  Today I have ordered repeat lab work to monitor the patient's kidney and liver function as well as other pain associated markers.  Pharmacotherapy Assessment  Analgesic: Oxycodone IR 5 mg, 1 tab PO q 6 hrs (20 mg/day of oxycodone) MME/day:30 mg/day.   Monitoring: Pharmacotherapy: No side-effects or adverse reactions reported. [place] PMP: PDMP reviewed during this encounter.       Compliance: No problems identified. Effectiveness: Clinically acceptable. Plan: Refer to "POC".  Pertinent Labs   SAFETY SCREENING Profile Lab Results  Component Value Date   STAPHAUREUS NEGATIVE 06/18/2016   MRSAPCR NEGATIVE 06/18/2016    PREGTESTUR  12/23/2006    NEGATIVE        THE SENSITIVITY OF THIS METHODOLOGY IS >24 mIU/mL   Renal Function Lab Results  Component Value Date   BUN 16 10/24/2016   CREATININE 1.01 39/76/7341   BCR NOT APPLICABLE 93/79/0240   GFRAA 81 10/24/2016   GFRNONAA 70 10/24/2016   Hepatic Function Lab Results  Component Value Date   AST 18 10/24/2016   ALT 16 10/24/2016   ALBUMIN 3.8 07/22/2016   UDS Summary  Date Value Ref Range Status  02/13/2018 FINAL  Final    Comment:    ==================================================================== TOXASSURE SELECT 13 (MW) ==================================================================== Test                             Result       Flag       Units Drug Present and Declared for Prescription Verification   Oxycodone                      2035         EXPECTED   ng/mg creat   Oxymorphone                    564          EXPECTED   ng/mg creat   Noroxycodone                   1930         EXPECTED   ng/mg creat   Noroxymorphone                 142          EXPECTED   ng/mg creat    Sources of oxycodone are scheduled prescription medications.    Oxymorphone, noroxycodone, and noroxymorphone are expected    metabolites of oxycodone. Oxymorphone is also available as a    scheduled prescription medication. Drug Absent but Declared for Prescription Verification   Clonazepam                     Not Detected UNEXPECTED ng/mg creat ==================================================================== Test                      Result    Flag   Units      Ref Range   Creatinine              103              mg/dL      >=20 ==================================================================== Declared Medications:  The flagging and interpretation on this report are based on the  following declared medications.  Unexpected results may arise from  inaccuracies in the declared medications.  **Note: The testing scope of this panel includes these  medications:  Clonazepam (Klonopin)  Oxycodone (Roxicodone)  **Note: The testing scope of this panel does not include following  reported medications:  Albuterol  Amitriptyline (Elavil)  Cholecalciferol  Diclofenac  Duloxetine (Cymbalta)  Esomeprazole  Methocarbamol (Robaxin)  Naproxen  Omeprazole (Prilosec)  Prednisone  Pregabalin (Lyrica)  Quetiapine (Seroquel) ==================================================================== For clinical consultation, please call [phone]. ====================================================================    Note: Above Lab results reviewed.  Recent imaging  DG Shoulder Right CLINICAL DATA:  Pt reports that she was thrown from her horse at full gallop around 5p. Reports R shoulder and scapular pain where she landed. No obvious bruising on her back or flanks. Best images obtained due to pt pain. Pt unable to perform.*comment was truncated*fall from horse  EXAM: RIGHT SHOULDER - 2+ VIEW  COMPARISON:  None.  FINDINGS: Glenohumeral joint is intact. The acromioclavicular joint is intact.  Along the inferolateral margin of the body of the scapula there is a linear lucency perpendicular to the margin of the scapula evident on two views. This lesion is approximately 2 cm medial to the glenoid fossa.  IMPRESSION: Minimally displaced fracture of the body of the scapula. No shoulder dislocation.  Electronically Signed   By: Suzy Bouchard M.D.   On: 07/24/2018 20:08  Assessment  The primary encounter diagnosis was Chronic pain syndrome. Diagnoses of Chronic neck pain (Primary Area of Pain) (Bilateral) (L>R), Chronic upper extremity pain (Secondary area of Pain) (Bilateral) (L>R), Chronic hip pain (Third area of Pain) (Bilateral) (R>L), Presence of functional implant ([employer] Lumbar spinal cord stimulator implant), Closed fracture of scapula, sequela (Right), Chronic musculoskeletal pain, Fibromyalgia, Muscle cramping,  Neurogenic pain, Pharmacologic therapy, Disorder of skeletal system, Problems influencing health status, and Vitamin D insufficiency were also pertinent to this visit.  Plan of Care  I have discontinued Khamiya Varin. Viele's oxyCODONE, oxyCODONE, and oxyCODONE-acetaminophen. I have also changed her oxyCODONE. Additionally, I am having her start on oxyCODONE and oxyCODONE. Lastly, I am having her maintain her Vitamin D3, albuterol, omeprazole, Diclofenac Sodium, QUEtiapine, predniSONE, Naproxen-Esomeprazole, clonazePAM, DULoxetine, lisinopril-hydrochlorothiazide, amitriptyline, and methocarbamol.  Pharmacotherapy (Medications Ordered): Meds ordered this encounter  Medications  . oxyCODONE (OXY IR/ROXICODONE) 5 MG immediate release tablet    Sig: Take 1 tablet (5 mg total) by mouth every 6 (six) hours as needed for severe pain. Must last 30 days    Dispense:  120 tablet    Refill:  0    Chronic Pain: STOP Act (Not applicable) Fill 1 day early if closed on refill date. Do not fill until: 08/26/2018. To last until: 09/25/2018. Avoid benzodiazepines within 8 hours of opioids  . oxyCODONE (OXY IR/ROXICODONE) 5 MG immediate release tablet    Sig: Take 1 tablet (5 mg total) by mouth every 6 (six) hours as needed for severe pain. Must last 30 days    Dispense:  120 tablet    Refill:  0    Chronic Pain: STOP Act (Not applicable) Fill 1 day early if closed on refill date. Do not fill until: 09/25/2018. To last until: 10/25/2018. Avoid benzodiazepines within 8 hours of opioids  . oxyCODONE (OXY IR/ROXICODONE) 5 MG immediate release tablet    Sig: Take 1 tablet (5 mg total) by mouth every 6 (six) hours as needed for severe pain. Must last 30 days    Dispense:  120 tablet    Refill:  0    Chronic Pain: STOP Act (Not applicable) Fill 1 day early if closed on refill date. Do not fill until: 10/25/2018. To last until: 11/24/2018. Avoid benzodiazepines within 8 hours of opioids  . amitriptyline (ELAVIL) 100 MG tablet     Sig: Take 1 tablet (100 mg total) by mouth at bedtime.    Dispense:  30 tablet    Refill:  2    Fill one day early if pharmacy is closed on scheduled refill date. May substitute for generic if available.  . methocarbamol (ROBAXIN) 750 MG tablet    Sig: Take 1 tablet (750 mg total) by mouth every 8 (eight) hours as needed for muscle spasms.    Dispense:  90 tablet    Refill:  2    Fill one day early if pharmacy is closed on scheduled refill date. May substitute for generic if available.   Orders:  Orders Placed This Encounter  Procedures  . Comp. Metabolic Panel (12)    With GFR. Indications: Chronic Pain Syndrome (G89.4) & Pharmacotherapy (W29.937)    Order Specific Question:   Has the patient fasted?    Answer:   No    Order Specific Question:   CC Results    Answer:   PCP-NURSE [[id number]]  . Magnesium    Indication: Pharmacologic therapy (L38.101)    Order Specific Question:   CC Results    Answer:   PCP-NURSE [[id number]]  . Vitamin B12    Indication: Pharmacologic therapy (E52.778).    Order Specific Question:   CC Results    Answer:   PCP-NURSE [[id number]]  . Sedimentation rate    Indication: Disorder of skeletal system (M89.9)    Order Specific Question:   CC Results    Answer:   PCP-NURSE [[id number]]  . 25-Hydroxyvitamin D Lcms D2+D3    Indication: Disorder of skeletal system (M89.9).    Order Specific Question:   CC Results    Answer:   PCP-NURSE [[id number]]  . C-reactive protein    Indication: Problems influencing health status (Z78.9)    Order Specific Question:   CC Results    Answer:   PCP-NURSE [[id number]]   Follow-up plan:   Return in about 3 months (around 11/18/2018) for (VV), E/M (MM).     Considering:   Diagnostic right suprascapular nerve block #1    Palliative PRN treatment(s):   Palliative right-sided lumbar facet RFA #2 (last done on 12/01/2017) (by Dr. Holley Raring)  Palliative left lumbar facet RFA #2 (last done on 09/22/2017) (by Dr. Holley Raring)  Palliative left-sided  C7-T1 CESI  Palliative bilateral lumbar facet blocks  PRN SCS programming adjustments (2018 replacement by Dr. Clydell Hakim)    Recent Visits No visits were found meeting these conditions.  Showing recent visits within past 90 days and meeting all other requirements   Today's Visits Date Type Provider Dept  08/20/18 Office Visit Milinda Pointer, MD [facility]  Showing today's visits and meeting all other requirements   Future Appointments No visits were found meeting these conditions.  Showing future appointments within next 90 days and meeting all other requirements   I discussed the assessment and treatment plan with the patient. The patient was provided an opportunity to ask questions and all were answered. The patient agreed with the plan and demonstrated an understanding of the instructions.  Patient advised to call back or seek an in-person evaluation if the symptoms or condition worsens.  Total duration of non-face-to-face encounter: 12 minutes.  Note by: Gaspar Cola, MD Date: 08/20/2018; Time: 10:10 AM  Note: This dictation was prepared with Dragon dictation. Any transcriptional errors that may result from this process are unintentional.  Disclaimer:  * Given the special circumstances of the COVID-19 pandemic, the federal government has announced that the Office for Civil Rights (OCR) will exercise its enforcement discretion and will not impose penalties on physicians using telehealth in the event of noncompliance with regulatory requirements under the [place] (HIPAA) in connection with the good faith provision of telehealth during the DTOIZ-12 national public health emergency. ([place])

## 2018-08-20 NOTE — Patient Instructions (Signed)
____________________________________________________________________________________________  Medication Rules  Purpose: To inform patients, and their family members, of our rules and regulations.  Applies to: All patients receiving prescriptions (written or electronic).  Pharmacy of record: Pharmacy where electronic prescriptions will be sent. If written prescriptions are taken to a different pharmacy, please inform the nursing staff. The pharmacy listed in the electronic medical record should be the one where you would like electronic prescriptions to be sent.  Electronic prescriptions: In compliance with the [place] Strengthen Opioid Misuse Prevention (STOP) Act of 2017 (Session Law 2017-74/H243), effective February 11, 2018, all controlled substances must be electronically prescribed. Calling prescriptions to the pharmacy will cease to exist.  Prescription refills: Only during scheduled appointments. Applies to all prescriptions.  NOTE: The following applies primarily to controlled substances (Opioid* Pain Medications).   Patient's responsibilities: 1. Pain Pills: Bring all pain pills to every appointment (except for procedure appointments). 2. Pill Bottles: Bring pills in original pharmacy bottle. Always bring the newest bottle. Bring bottle, even if empty. 3. Medication refills: You are responsible for knowing and keeping track of what medications you take and those you need refilled. The day before your appointment: write a list of all prescriptions that need to be refilled. The day of the appointment: give the list to the admitting nurse. Prescriptions will be written only during appointments. No prescriptions will be written on procedure days. If you forget a medication: it will not be "Called in", "Faxed", or "electronically sent". You will need to get another appointment to get these prescribed. No early refills. Do not call asking to have your prescription filled  early. 4. Prescription Accuracy: You are responsible for carefully inspecting your prescriptions before leaving our office. Have the discharge nurse carefully go over each prescription with you, before taking them home. Make sure that your name is accurately spelled, that your address is correct. Check the name and dose of your medication to make sure it is accurate. Check the number of pills, and the written instructions to make sure they are clear and accurate. Make sure that you are given enough medication to last until your next medication refill appointment. 5. Taking Medication: Take medication as prescribed. When it comes to controlled substances, taking less pills or less frequently than prescribed is permitted and encouraged. Never take more pills than instructed. Never take medication more frequently than prescribed.  6. Inform other Doctors: Always inform, all of your healthcare providers, of all the medications you take. 7. Pain Medication from other Providers: You are not allowed to accept any additional pain medication from any other Doctor or Healthcare provider. There are two exceptions to this rule. (see below) In the event that you require additional pain medication, you are responsible for notifying us, as stated below. 8. Medication Agreement: You are responsible for carefully reading and following our Medication Agreement. This must be signed before receiving any prescriptions from our practice. Safely store a copy of your signed Agreement. Violations to the Agreement will result in no further prescriptions. (Additional copies of our Medication Agreement are available upon request.) 9. Laws, Rules, & Regulations: All patients are expected to follow all Federal and State Laws, Statutes, Rules, & Regulations. Ignorance of the Laws does not constitute a valid excuse. The use of any illegal substances is prohibited. 10. Adopted CDC guidelines & recommendations: Target dosing levels will be  at or below 60 MME/day. Use of benzodiazepines** is not recommended.  Exceptions: There are only two exceptions to the rule of not   receiving pain medications from other Healthcare Providers. 1. Exception #1 (Emergencies): In the event of an emergency (i.e.: accident requiring emergency care), you are allowed to receive additional pain medication. However, you are responsible for: As soon as you are able, call our office [phone], at any time of the day or night, and leave a message stating your name, the date and nature of the emergency, and the name and dose of the medication prescribed. In the event that your call is answered by a member of our staff, make sure to document and save the date, time, and the name of the person that took your information.  2. Exception #2 (Planned Surgery): In the event that you are scheduled by another doctor or dentist to have any type of surgery or procedure, you are allowed (for a period no longer than 30 days), to receive additional pain medication, for the acute post-op pain. However, in this case, you are responsible for picking up a copy of our "Post-op Pain Management for Surgeons" handout, and giving it to your surgeon or dentist. This document is available at our office, and does not require an appointment to obtain it. Simply go to our office during business hours (Monday-Thursday from 8:00 AM to 4:00 PM) (Friday 8:00 AM to 12:00 Noon) or if you have a scheduled appointment with us, prior to your surgery, and ask for it by name. In addition, you will need to provide us with your name, name of your surgeon, type of surgery, and date of procedure or surgery.  *Opioid medications include: morphine, codeine, oxycodone, oxymorphone, hydrocodone, hydromorphone, meperidine, tramadol, tapentadol, buprenorphine, fentanyl, methadone. **Benzodiazepine medications include: diazepam (Valium), alprazolam (Xanax), clonazepam (Klonopine), lorazepam (Ativan), clorazepate  (Tranxene), chlordiazepoxide (Librium), estazolam (Prosom), oxazepam (Serax), temazepam (Restoril), triazolam (Halcion) (Last updated: 04/10/2017) ____________________________________________________________________________________________   ____________________________________________________________________________________________  Medication Recommendations and Reminders  Applies to: All patients receiving prescriptions (written and/or electronic).  Medication Rules & Regulations: These rules and regulations exist for your safety and that of others. They are not flexible and neither are we. Dismissing or ignoring them will be considered "non-compliance" with medication therapy, resulting in complete and irreversible termination of such therapy. (See document titled "Medication Rules" for more details.) In all conscience, because of safety reasons, we cannot continue providing a therapy where the patient does not follow instructions.  Pharmacy of record:   Definition: This is the pharmacy where your electronic prescriptions will be sent.   We do not endorse any particular pharmacy.  You are not restricted in your choice of pharmacy.  The pharmacy listed in the electronic medical record should be the one where you want electronic prescriptions to be sent.  If you choose to change pharmacy, simply notify our nursing staff of your choice of new pharmacy.  Recommendations:  Keep all of your pain medications in a safe place, under lock and key, even if you live alone.   After you fill your prescription, take 1 week's worth of pills and put them away in a safe place. You should keep a separate, properly labeled bottle for this purpose. The remainder should be kept in the original bottle. Use this as your primary supply, until it runs out. Once it's gone, then you know that you have 1 week's worth of medicine, and it is time to come in for a prescription refill. If you do this correctly, it  is unlikely that you will ever run out of medicine.  To make sure that the above recommendation works,   it is very important that you make sure your medication refill appointments are scheduled at least 1 week before you run out of medicine. To do this in an effective manner, make sure that you do not leave the office without scheduling your next medication management appointment. Always ask the nursing staff to show you in your prescription , when your medication will be running out. Then arrange for the receptionist to get you a return appointment, at least 7 days before you run out of medicine. Do not wait until you have 1 or 2 pills left, to come in. This is very poor planning and does not take into consideration that we may need to cancel appointments due to bad weather, sickness, or emergencies affecting our staff.  "Partial Fill": If for any reason your pharmacy does not have enough pills/tablets to completely fill or refill your prescription, do not allow for a "partial fill". You will need a separate prescription to fill the remaining amount, which we will not provide. If the reason for the partial fill is your insurance, you will need to talk to the pharmacist about payment alternatives for the remaining tablets, but again, do not accept a partial fill.  Prescription refills and/or changes in medication(s):   Prescription refills, and/or changes in dose or medication, will be conducted only during scheduled medication management appointments. (Applies to both, written and electronic prescriptions.)  No refills on procedure days. No medication will be changed or started on procedure days. No changes, adjustments, and/or refills will be conducted on a procedure day. Doing so will interfere with the diagnostic portion of the procedure.  No phone refills. No medications will be "called into the pharmacy".  No Fax refills.  No weekend refills.  No Holliday refills.  No after hours  refills.  Remember:  Business hours are:  Monday to Thursday 8:00 AM to 4:00 PM Provider's Schedule: Crystal King, NP - Appointments are:  Medication management: Monday to Thursday 8:00 AM to 4:00 PM Connell Bognar, MD - Appointments are:  Medication management: Monday and Wednesday 8:00 AM to 4:00 PM Procedure day: Tuesday and Thursday 7:30 AM to 4:00 PM Bilal Lateef, MD - Appointments are:  Medication management: Tuesday and Thursday 8:00 AM to 4:00 PM Procedure day: Monday and Wednesday 7:30 AM to 4:00 PM (Last update: 04/10/2017) ____________________________________________________________________________________________   

## 2018-09-11 ENCOUNTER — Other Ambulatory Visit: Payer: Self-pay | Admitting: Orthopaedic Surgery

## 2018-09-11 ENCOUNTER — Other Ambulatory Visit: Payer: Self-pay | Admitting: Orthopedic Surgery

## 2018-09-11 DIAGNOSIS — M25511 Pain in right shoulder: Secondary | ICD-10-CM

## 2018-10-10 ENCOUNTER — Other Ambulatory Visit: Payer: Self-pay

## 2018-10-10 ENCOUNTER — Ambulatory Visit
Admission: RE | Admit: 2018-10-10 | Discharge: 2018-10-10 | Disposition: A | Discharge status: Home / Self Care | Payer: BLUE CROSS/BLUE SHIELD | Source: Ambulatory Visit | Attending: Orthopaedic Surgery | Admitting: Orthopaedic Surgery

## 2018-10-10 DIAGNOSIS — M25511 Pain in right shoulder: Secondary | ICD-10-CM

## 2018-10-22 ENCOUNTER — Other Ambulatory Visit: Payer: Self-pay

## 2018-10-22 ENCOUNTER — Encounter (HOSPITAL_BASED_OUTPATIENT_CLINIC_OR_DEPARTMENT_OTHER): Payer: Self-pay | Admitting: *Deleted

## 2018-10-26 ENCOUNTER — Encounter (HOSPITAL_BASED_OUTPATIENT_CLINIC_OR_DEPARTMENT_OTHER)
Admission: RE | Admit: 2018-10-26 | Discharge: 2018-10-26 | Disposition: A | Discharge status: Home / Self Care | Payer: BLUE CROSS/BLUE SHIELD | Source: Ambulatory Visit | Attending: Orthopaedic Surgery | Admitting: Orthopaedic Surgery

## 2018-10-26 ENCOUNTER — Other Ambulatory Visit (HOSPITAL_COMMUNITY)
Admission: RE | Admit: 2018-10-26 | Discharge: 2018-10-26 | Disposition: A | Discharge status: Home / Self Care | Payer: BLUE CROSS/BLUE SHIELD | Source: Ambulatory Visit | Attending: Orthopaedic Surgery | Admitting: Orthopaedic Surgery

## 2018-10-26 ENCOUNTER — Other Ambulatory Visit: Payer: Self-pay

## 2018-10-26 DIAGNOSIS — Z01818 Encounter for other preprocedural examination: Secondary | ICD-10-CM | POA: Insufficient documentation

## 2018-10-26 DIAGNOSIS — F329 Major depressive disorder, single episode, unspecified: Secondary | ICD-10-CM | POA: Diagnosis not present

## 2018-10-26 DIAGNOSIS — I10 Essential (primary) hypertension: Secondary | ICD-10-CM | POA: Diagnosis not present

## 2018-10-26 DIAGNOSIS — K219 Gastro-esophageal reflux disease without esophagitis: Secondary | ICD-10-CM | POA: Diagnosis not present

## 2018-10-26 DIAGNOSIS — G894 Chronic pain syndrome: Secondary | ICD-10-CM | POA: Diagnosis not present

## 2018-10-26 DIAGNOSIS — Z818 Family history of other mental and behavioral disorders: Secondary | ICD-10-CM | POA: Diagnosis not present

## 2018-10-26 DIAGNOSIS — M7541 Impingement syndrome of right shoulder: Secondary | ICD-10-CM | POA: Diagnosis not present

## 2018-10-26 DIAGNOSIS — E669 Obesity, unspecified: Secondary | ICD-10-CM | POA: Diagnosis not present

## 2018-10-26 DIAGNOSIS — Z9049 Acquired absence of other specified parts of digestive tract: Secondary | ICD-10-CM | POA: Diagnosis not present

## 2018-10-26 DIAGNOSIS — Z825 Family history of asthma and other chronic lower respiratory diseases: Secondary | ICD-10-CM | POA: Diagnosis not present

## 2018-10-26 DIAGNOSIS — Z79899 Other long term (current) drug therapy: Secondary | ICD-10-CM | POA: Diagnosis not present

## 2018-10-26 DIAGNOSIS — F419 Anxiety disorder, unspecified: Secondary | ICD-10-CM | POA: Diagnosis not present

## 2018-10-26 DIAGNOSIS — Z823 Family history of stroke: Secondary | ICD-10-CM | POA: Diagnosis not present

## 2018-10-26 DIAGNOSIS — Z6841 Body Mass Index (BMI) 40.0 and over, adult: Secondary | ICD-10-CM | POA: Diagnosis not present

## 2018-10-26 DIAGNOSIS — Z791 Long term (current) use of non-steroidal anti-inflammatories (NSAID): Secondary | ICD-10-CM | POA: Diagnosis not present

## 2018-10-26 DIAGNOSIS — M75101 Unspecified rotator cuff tear or rupture of right shoulder, not specified as traumatic: Secondary | ICD-10-CM | POA: Diagnosis present

## 2018-10-26 DIAGNOSIS — M797 Fibromyalgia: Secondary | ICD-10-CM | POA: Diagnosis not present

## 2018-10-26 DIAGNOSIS — Z87891 Personal history of nicotine dependence: Secondary | ICD-10-CM | POA: Diagnosis not present

## 2018-10-26 DIAGNOSIS — M329 Systemic lupus erythematosus, unspecified: Secondary | ICD-10-CM | POA: Diagnosis not present

## 2018-10-26 DIAGNOSIS — Z888 Allergy status to other drugs, medicaments and biological substances status: Secondary | ICD-10-CM | POA: Diagnosis not present

## 2018-10-26 DIAGNOSIS — R000 Tachycardia, unspecified: Secondary | ICD-10-CM | POA: Insufficient documentation

## 2018-10-26 DIAGNOSIS — J45909 Unspecified asthma, uncomplicated: Secondary | ICD-10-CM | POA: Diagnosis not present

## 2018-10-26 DIAGNOSIS — Z8269 Family history of other diseases of the musculoskeletal system and connective tissue: Secondary | ICD-10-CM | POA: Diagnosis not present

## 2018-10-26 DIAGNOSIS — Z91040 Latex allergy status: Secondary | ICD-10-CM | POA: Diagnosis not present

## 2018-10-26 DIAGNOSIS — Z20828 Contact with and (suspected) exposure to other viral communicable diseases: Secondary | ICD-10-CM | POA: Diagnosis not present

## 2018-10-26 DIAGNOSIS — Z881 Allergy status to other antibiotic agents status: Secondary | ICD-10-CM | POA: Diagnosis not present

## 2018-10-26 DIAGNOSIS — M19011 Primary osteoarthritis, right shoulder: Secondary | ICD-10-CM | POA: Diagnosis not present

## 2018-10-26 DIAGNOSIS — Z8349 Family history of other endocrine, nutritional and metabolic diseases: Secondary | ICD-10-CM | POA: Diagnosis not present

## 2018-10-26 LAB — BASIC METABOLIC PANEL
Anion gap: 10 (ref 5–15)
BUN: 10 mg/dL (ref 6–20)
CO2: 25 mmol/L (ref 22–32)
Calcium: 9.1 mg/dL (ref 8.9–10.3)
Chloride: 102 mmol/L (ref 98–111)
Creatinine, Ser: 1 mg/dL (ref 0.44–1.00)
GFR calc Af Amer: >60 mL/min (ref >60)
GFR calc non Af Amer: >60 mL/min (ref >60)
Glucose, Bld: 120 mg/dL — ABNORMAL HIGH (ref 70–99)
Potassium: 4.1 mmol/L (ref 3.5–5.1)
Sodium: 137 mmol/L (ref 135–145)

## 2018-10-26 NOTE — Progress Notes (Signed)

## 2018-10-27 LAB — NOVEL CORONAVIRUS, NAA (HOSP ORDER, SEND-OUT TO REF LAB; TAT 18-24 HRS): SARS-CoV-2, NAA: NOT DETECTED

## 2018-10-29 ENCOUNTER — Ambulatory Visit (HOSPITAL_BASED_OUTPATIENT_CLINIC_OR_DEPARTMENT_OTHER): Payer: BLUE CROSS/BLUE SHIELD | Admitting: Anesthesiology

## 2018-10-29 ENCOUNTER — Ambulatory Visit (HOSPITAL_BASED_OUTPATIENT_CLINIC_OR_DEPARTMENT_OTHER)
Admission: RE | Admit: 2018-10-29 | Discharge: 2018-10-29 | Disposition: A | Discharge status: Home / Self Care | Payer: BLUE CROSS/BLUE SHIELD | Attending: Orthopaedic Surgery | Admitting: Orthopaedic Surgery

## 2018-10-29 ENCOUNTER — Encounter (HOSPITAL_BASED_OUTPATIENT_CLINIC_OR_DEPARTMENT_OTHER)
Admission: RE | Disposition: A | Discharge status: Home / Self Care | Payer: Self-pay | Source: Home / Self Care | Attending: Orthopaedic Surgery

## 2018-10-29 ENCOUNTER — Encounter (HOSPITAL_BASED_OUTPATIENT_CLINIC_OR_DEPARTMENT_OTHER): Payer: Self-pay | Admitting: Anesthesiology

## 2018-10-29 ENCOUNTER — Other Ambulatory Visit: Payer: Self-pay

## 2018-10-29 DIAGNOSIS — E669 Obesity, unspecified: Secondary | ICD-10-CM | POA: Insufficient documentation

## 2018-10-29 DIAGNOSIS — Z91040 Latex allergy status: Secondary | ICD-10-CM | POA: Insufficient documentation

## 2018-10-29 DIAGNOSIS — Z823 Family history of stroke: Secondary | ICD-10-CM | POA: Insufficient documentation

## 2018-10-29 DIAGNOSIS — Z8349 Family history of other endocrine, nutritional and metabolic diseases: Secondary | ICD-10-CM | POA: Insufficient documentation

## 2018-10-29 DIAGNOSIS — K219 Gastro-esophageal reflux disease without esophagitis: Secondary | ICD-10-CM | POA: Insufficient documentation

## 2018-10-29 DIAGNOSIS — Z881 Allergy status to other antibiotic agents status: Secondary | ICD-10-CM | POA: Insufficient documentation

## 2018-10-29 DIAGNOSIS — M19011 Primary osteoarthritis, right shoulder: Secondary | ICD-10-CM | POA: Insufficient documentation

## 2018-10-29 DIAGNOSIS — M797 Fibromyalgia: Secondary | ICD-10-CM | POA: Insufficient documentation

## 2018-10-29 DIAGNOSIS — Z791 Long term (current) use of non-steroidal anti-inflammatories (NSAID): Secondary | ICD-10-CM | POA: Insufficient documentation

## 2018-10-29 DIAGNOSIS — F329 Major depressive disorder, single episode, unspecified: Secondary | ICD-10-CM | POA: Insufficient documentation

## 2018-10-29 DIAGNOSIS — F419 Anxiety disorder, unspecified: Secondary | ICD-10-CM | POA: Insufficient documentation

## 2018-10-29 DIAGNOSIS — Z79899 Other long term (current) drug therapy: Secondary | ICD-10-CM | POA: Insufficient documentation

## 2018-10-29 DIAGNOSIS — M329 Systemic lupus erythematosus, unspecified: Secondary | ICD-10-CM | POA: Insufficient documentation

## 2018-10-29 DIAGNOSIS — Z825 Family history of asthma and other chronic lower respiratory diseases: Secondary | ICD-10-CM | POA: Insufficient documentation

## 2018-10-29 DIAGNOSIS — Z886 Allergy status to analgesic agent status: Secondary | ICD-10-CM | POA: Insufficient documentation

## 2018-10-29 DIAGNOSIS — Z8269 Family history of other diseases of the musculoskeletal system and connective tissue: Secondary | ICD-10-CM | POA: Insufficient documentation

## 2018-10-29 DIAGNOSIS — I10 Essential (primary) hypertension: Secondary | ICD-10-CM | POA: Insufficient documentation

## 2018-10-29 DIAGNOSIS — Z818 Family history of other mental and behavioral disorders: Secondary | ICD-10-CM | POA: Insufficient documentation

## 2018-10-29 DIAGNOSIS — J45909 Unspecified asthma, uncomplicated: Secondary | ICD-10-CM | POA: Insufficient documentation

## 2018-10-29 DIAGNOSIS — Z87891 Personal history of nicotine dependence: Secondary | ICD-10-CM | POA: Insufficient documentation

## 2018-10-29 DIAGNOSIS — Z888 Allergy status to other drugs, medicaments and biological substances status: Secondary | ICD-10-CM | POA: Insufficient documentation

## 2018-10-29 DIAGNOSIS — G894 Chronic pain syndrome: Secondary | ICD-10-CM | POA: Insufficient documentation

## 2018-10-29 DIAGNOSIS — Z9049 Acquired absence of other specified parts of digestive tract: Secondary | ICD-10-CM | POA: Insufficient documentation

## 2018-10-29 DIAGNOSIS — Z6841 Body Mass Index (BMI) 40.0 and over, adult: Secondary | ICD-10-CM | POA: Insufficient documentation

## 2018-10-29 DIAGNOSIS — M7541 Impingement syndrome of right shoulder: Secondary | ICD-10-CM | POA: Insufficient documentation

## 2018-10-29 DIAGNOSIS — Z885 Allergy status to narcotic agent status: Secondary | ICD-10-CM | POA: Insufficient documentation

## 2018-10-29 HISTORY — PX: SUBACROMIAL DECOMPRESSION: SHX5174

## 2018-10-29 HISTORY — PX: SHOULDER ARTHROSCOPY WITH DISTAL CLAVICLE RESECTION: SHX5675

## 2018-10-29 SURGERY — SHOULDER ARTHROSCOPY WITH DISTAL CLAVICLE RESECTION
Anesthesia: General | Site: Shoulder | Laterality: Right

## 2018-10-29 MED ORDER — DEXAMETHASONE SODIUM PHOSPHATE 10 MG/ML IJ SOLN
INTRAMUSCULAR | Status: AC
  Filled 2018-10-29: qty 1

## 2018-10-29 MED ORDER — FENTANYL CITRATE (PF) 100 MCG/2ML IJ SOLN
INTRAMUSCULAR | Status: AC
  Filled 2018-10-29: qty 2

## 2018-10-29 MED ORDER — ONDANSETRON HCL 4 MG/2ML IJ SOLN
INTRAMUSCULAR | Status: AC
  Filled 2018-10-29: qty 2

## 2018-10-29 MED ORDER — ACETAMINOPHEN 10 MG/ML IV SOLN: 1000.0000 mg | Freq: Once | INTRAVENOUS | Status: DC | PRN

## 2018-10-29 MED ORDER — PROPOFOL 10 MG/ML IV BOLUS
INTRAVENOUS | Status: DC | PRN
  Administered 2018-10-29: 150 mg via INTRAVENOUS

## 2018-10-29 MED ORDER — SUGAMMADEX SODIUM 200 MG/2ML IV SOLN
INTRAVENOUS | Status: DC | PRN
  Administered 2018-10-29: 200 mg via INTRAVENOUS

## 2018-10-29 MED ORDER — EPINEPHRINE PF 1 MG/ML IJ SOLN
INTRAMUSCULAR | Status: AC
  Filled 2018-10-29: qty 4

## 2018-10-29 MED ORDER — FENTANYL CITRATE (PF) 100 MCG/2ML IJ SOLN
50.0000 ug | INTRAMUSCULAR | Status: DC | PRN
  Administered 2018-10-29: 50 ug via INTRAVENOUS

## 2018-10-29 MED ORDER — DEXAMETHASONE SODIUM PHOSPHATE 4 MG/ML IJ SOLN
INTRAMUSCULAR | Status: DC | PRN
  Administered 2018-10-29: 4 mg via INTRAVENOUS

## 2018-10-29 MED ORDER — BUPIVACAINE LIPOSOME 1.3 % IJ SUSP
INTRAMUSCULAR | Status: DC | PRN
  Administered 2018-10-29: 10 mL via PERINEURAL

## 2018-10-29 MED ORDER — CLINDAMYCIN PHOSPHATE 900 MG/50ML IV SOLN
900.0000 mg | INTRAVENOUS | Status: AC
  Administered 2018-10-29: 900 mg via INTRAVENOUS

## 2018-10-29 MED ORDER — OXYCODONE HCL 5 MG/5ML PO SOLN: 5.0000 mg | Freq: Once | ORAL | Status: DC | PRN

## 2018-10-29 MED ORDER — ACETAMINOPHEN 325 MG PO TABS: 325.0000 mg | ORAL_TABLET | Freq: Once | ORAL | Status: DC | PRN

## 2018-10-29 MED ORDER — PHENYLEPHRINE HCL (PRESSORS) 10 MG/ML IV SOLN
INTRAVENOUS | Status: DC | PRN
  Administered 2018-10-29 (×4): 40 ug via INTRAVENOUS

## 2018-10-29 MED ORDER — LACTATED RINGERS IV SOLN: INTRAVENOUS | Status: DC

## 2018-10-29 MED ORDER — ONDANSETRON HCL 4 MG PO TABS: 4.0000 mg | ORAL_TABLET | Freq: Three times a day (TID) | ORAL | 1 refills | Status: AC | PRN

## 2018-10-29 MED ORDER — SUCCINYLCHOLINE CHLORIDE 20 MG/ML IJ SOLN
INTRAMUSCULAR | Status: DC | PRN
  Administered 2018-10-29: 100 mg via INTRAVENOUS

## 2018-10-29 MED ORDER — ACETAMINOPHEN 160 MG/5ML PO SOLN: 325.0000 mg | Freq: Once | ORAL | Status: DC | PRN

## 2018-10-29 MED ORDER — ROCURONIUM 10MG/ML (10ML) SYRINGE FOR MEDFUSION PUMP - OPTIME
INTRAVENOUS | Status: DC | PRN
  Administered 2018-10-29: 30 mg via INTRAVENOUS

## 2018-10-29 MED ORDER — OXYCODONE HCL 5 MG PO TABS: ORAL_TABLET | ORAL | 0 refills | Status: DC

## 2018-10-29 MED ORDER — FENTANYL CITRATE (PF) 100 MCG/2ML IJ SOLN
INTRAMUSCULAR | Status: DC | PRN
  Administered 2018-10-29: 50 ug via INTRAVENOUS

## 2018-10-29 MED ORDER — CLINDAMYCIN PHOSPHATE 900 MG/50ML IV SOLN
INTRAVENOUS | Status: AC
  Filled 2018-10-29: qty 50

## 2018-10-29 MED ORDER — CHLORHEXIDINE GLUCONATE 4 % EX LIQD: 60.0000 mL | Freq: Once | CUTANEOUS | Status: DC

## 2018-10-29 MED ORDER — LACTATED RINGERS IV SOLN
INTRAVENOUS | Status: DC
  Administered 2018-10-29: 11:00:00 via INTRAVENOUS

## 2018-10-29 MED ORDER — MELOXICAM 7.5 MG PO TABS: 7.5000 mg | ORAL_TABLET | Freq: Every day | ORAL | 2 refills | Status: DC

## 2018-10-29 MED ORDER — LIDOCAINE 2% (20 MG/ML) 5 ML SYRINGE
INTRAMUSCULAR | Status: AC
  Filled 2018-10-29: qty 5

## 2018-10-29 MED ORDER — BUPIVACAINE HCL (PF) 0.5 % IJ SOLN
INTRAMUSCULAR | Status: DC | PRN
  Administered 2018-10-29: 10 mL via PERINEURAL

## 2018-10-29 MED ORDER — SODIUM CHLORIDE 0.9 % IR SOLN
Status: DC | PRN
  Administered 2018-10-29: 4000 mL

## 2018-10-29 MED ORDER — OXYCODONE HCL 5 MG PO TABS: 5.0000 mg | ORAL_TABLET | Freq: Once | ORAL | Status: DC | PRN

## 2018-10-29 MED ORDER — ONDANSETRON HCL 4 MG/2ML IJ SOLN
INTRAMUSCULAR | Status: DC | PRN
  Administered 2018-10-29: 4 mg via INTRAVENOUS

## 2018-10-29 MED ORDER — MIDAZOLAM HCL 2 MG/2ML IJ SOLN
INTRAMUSCULAR | Status: AC
  Filled 2018-10-29: qty 2

## 2018-10-29 MED ORDER — ROCURONIUM BROMIDE 10 MG/ML (PF) SYRINGE
PREFILLED_SYRINGE | INTRAVENOUS | Status: AC
  Filled 2018-10-29: qty 10

## 2018-10-29 MED ORDER — ACETAMINOPHEN 500 MG PO TABS: 1000.0000 mg | ORAL_TABLET | Freq: Three times a day (TID) | ORAL | 0 refills | Status: AC

## 2018-10-29 MED ORDER — POVIDONE-IODINE 10 % EX SOLN
Freq: Once | CUTANEOUS | Status: AC
  Administered 2018-10-29: 11:00:00 via TOPICAL

## 2018-10-29 MED ORDER — FENTANYL CITRATE (PF) 100 MCG/2ML IJ SOLN: 25.0000 ug | INTRAMUSCULAR | Status: DC | PRN

## 2018-10-29 MED ORDER — MIDAZOLAM HCL 2 MG/2ML IJ SOLN
1.0000 mg | INTRAMUSCULAR | Status: DC | PRN
  Administered 2018-10-29: 2 mg via INTRAVENOUS

## 2018-10-29 MED ORDER — PROPOFOL 500 MG/50ML IV EMUL
INTRAVENOUS | Status: DC | PRN
  Administered 2018-10-29: 25 ug/kg/min via INTRAVENOUS

## 2018-10-29 MED ORDER — LIDOCAINE HCL (CARDIAC) PF 100 MG/5ML IV SOSY
PREFILLED_SYRINGE | INTRAVENOUS | Status: DC | PRN
  Administered 2018-10-29: 30 mg via INTRAVENOUS

## 2018-10-29 MED ORDER — PROMETHAZINE HCL 25 MG/ML IJ SOLN: 6.2500 mg | INTRAMUSCULAR | Status: DC | PRN

## 2018-10-29 MED ORDER — MEPERIDINE HCL 25 MG/ML IJ SOLN: 6.2500 mg | INTRAMUSCULAR | Status: DC | PRN

## 2018-10-29 SURGICAL SUPPLY — 57 items
BLADE EXCALIBUR 4.0X13 (MISCELLANEOUS) ×2 IMPLANT
BURR OVAL 8 FLU 4.0X13 (MISCELLANEOUS) IMPLANT
CANNULA PASSPORT 5 (CANNULA) IMPLANT
CANNULA PASSPORT BUTTON 10-40 (CANNULA) IMPLANT
CANNULA TWIST IN 8.25X7CM (CANNULA) IMPLANT
CHLORAPREP W/TINT 26 (MISCELLANEOUS) ×2 IMPLANT
COVER WAND RF STERILE (DRAPES) IMPLANT
DECANTER SPIKE VIAL GLASS SM (MISCELLANEOUS) IMPLANT
DISSECTOR 3.5MM X 13CM CVD (MISCELLANEOUS) IMPLANT
DISSECTOR 4.0MMX13CM CVD (MISCELLANEOUS) IMPLANT
DRAPE HALF SHEET 70X43 (DRAPES) ×2 IMPLANT
DRAPE IMP U-DRAPE 54X76 (DRAPES) ×2 IMPLANT
DRAPE INCISE IOBAN 66X45 STRL (DRAPES) IMPLANT
DRAPE SHOULDER BEACH CHAIR (DRAPES) ×2 IMPLANT
DRSG PAD ABDOMINAL 8X10 ST (GAUZE/BANDAGES/DRESSINGS) ×2 IMPLANT
DW OUTFLOW CASSETTE/TUBE SET (MISCELLANEOUS) ×2 IMPLANT
GAUZE SPONGE 4X4 12PLY STRL (GAUZE/BANDAGES/DRESSINGS) ×2 IMPLANT
GAUZE XEROFORM 1X8 LF (GAUZE/BANDAGES/DRESSINGS) IMPLANT
GLOVE BIO SURGEON STRL SZ 6.5 (GLOVE) ×2 IMPLANT
GLOVE BIOGEL PI IND STRL 6.5 (GLOVE) ×1 IMPLANT
GLOVE BIOGEL PI IND STRL 8 (GLOVE) ×1 IMPLANT
GLOVE BIOGEL PI INDICATOR 6.5 (GLOVE) ×1
GLOVE BIOGEL PI INDICATOR 8 (GLOVE) ×1
GLOVE ECLIPSE 8.0 STRL XLNG CF (GLOVE) ×2 IMPLANT
GOWN STRL REUS W/ TWL LRG LVL3 (GOWN DISPOSABLE) ×1 IMPLANT
GOWN STRL REUS W/TWL LRG LVL3 (GOWN DISPOSABLE) ×1
GOWN STRL REUS W/TWL XL LVL3 (GOWN DISPOSABLE) ×2 IMPLANT
KIT STABILIZATION SHOULDER (MISCELLANEOUS) ×2 IMPLANT
KIT STR SPEAR 1.8 FBRTK DISP (KITS) IMPLANT
LASSO 90 CVE QUICKPAS (DISPOSABLE) IMPLANT
MANIFOLD NEPTUNE II (INSTRUMENTS) ×2 IMPLANT
NDL SAFETY ECLIPSE 18X1.5 (NEEDLE) ×1 IMPLANT
NDL SCORPION MULTI FIRE (NEEDLE) IMPLANT
NDL SUT 6 .5 CRC .975X.05 MAYO (NEEDLE) IMPLANT
NEEDLE HYPO 18GX1.5 SHARP (NEEDLE) ×1
NEEDLE MAYO TAPER (NEEDLE)
NEEDLE SCORPION MULTI FIRE (NEEDLE) IMPLANT
PACK ARTHROSCOPY DSU (CUSTOM PROCEDURE TRAY) ×2 IMPLANT
PACK BASIN DAY SURGERY FS (CUSTOM PROCEDURE TRAY) ×2 IMPLANT
PORT APPOLLO RF 90DEGREE MULTI (SURGICAL WAND) ×2 IMPLANT
RESTRAINT HEAD UNIVERSAL NS (MISCELLANEOUS) ×2 IMPLANT
SLEEVE SCD COMPRESS KNEE MED (MISCELLANEOUS) ×2 IMPLANT
SLING ARM FOAM STRAP LRG (SOFTGOODS) ×1 IMPLANT
SPONGE LAP 4X18 RFD (DISPOSABLE) IMPLANT
STRIP CLOSURE SKIN 1/2X4 (GAUZE/BANDAGES/DRESSINGS) IMPLANT
SUT FIBERWIRE #2 38 T-5 BLUE (SUTURE)
SUT MNCRL AB 4-0 PS2 18 (SUTURE) ×2 IMPLANT
SUT PDS AB 1 CT  36 (SUTURE)
SUT PDS AB 1 CT 36 (SUTURE) IMPLANT
SUT TIGER TAPE 7 IN WHITE (SUTURE) IMPLANT
SUTURE FIBERWR #2 38 T-5 BLUE (SUTURE) IMPLANT
SUTURE TAPE TIGERLINK 1.3MM BL (SUTURE) IMPLANT
SUTURETAPE TIGERLINK 1.3MM BL (SUTURE)
SYR 5ML LL (SYRINGE) ×2 IMPLANT
TAPE FIBER 2MM 7IN #2 BLUE (SUTURE) IMPLANT
TOWEL GREEN STERILE FF (TOWEL DISPOSABLE) ×2 IMPLANT
TUBING ARTHROSCOPY IRRIG 16FT (MISCELLANEOUS) ×2 IMPLANT

## 2018-10-29 NOTE — Anesthesia Procedure Notes (Signed)
Anesthesia Regional Block: Interscalene brachial plexus block   Pre-Anesthetic Checklist: ,, timeout performed, Correct Patient, Correct Site, Correct Laterality, Correct Procedure, Correct Position, site marked, Risks and benefits discussed,  Surgical consent,  Pre-op evaluation,  At surgeon's request and post-op pain management  Laterality: Right  Prep: chloraprep       Needles:  Injection technique: Single-shot  Needle Type: Echogenic Stimulator Needle     Needle Length: 9cm  Needle Gauge: 21     Additional Needles:   Procedures:,,,, ultrasound used (permanent image in chart),,,,  Narrative:  Start time: 10/29/2018 11:15 AM End time: 10/29/2018 11:25 AM Injection made incrementally with aspirations every 5 mL.  Performed by: Personally  Anesthesiologist: Effie Berkshire, MD  Additional Notes: Patient tolerated the procedure well. Local anesthetic introduced in an incremental fashion under minimal resistance after negative aspirations. No paresthesias were elicited. After completion of the procedure, no acute issues were identified and patient continued to be monitored by RN.

## 2018-10-29 NOTE — Anesthesia Postprocedure Evaluation (Signed)
Anesthesia Post Note  Patient: Mary Cruz  Procedure(s) Performed: SHOULDER ARTHROSCOPY WITH DISTAL CLAVICLE RESECTION (Right Shoulder) SUBACROMIAL DECOMPRESSION (Right Shoulder)     Patient location during evaluation: PACU Anesthesia Type: General Level of consciousness: awake and alert Pain management: pain level controlled Vital Signs Assessment: post-procedure vital signs reviewed and stable Respiratory status: spontaneous breathing, nonlabored ventilation, respiratory function stable and patient connected to nasal cannula oxygen Cardiovascular status: blood pressure returned to baseline and stable Postop Assessment: no apparent nausea or vomiting Anesthetic complications: no    Last Vitals:  Vitals:   10/29/18 1300 10/29/18 1315  BP: (!) 97/57 106/73  Pulse: 90 89  Resp: (!) 21 19  Temp:    SpO2: 100% 97%    Last Pain:  Vitals:   10/29/18 1330  TempSrc:   PainSc: 0-No pain                 Effie Berkshire

## 2018-10-29 NOTE — Transfer of Care (Signed)
Immediate Anesthesia Transfer of Care Note  Patient: Mary Cruz  Procedure(s) Performed: SHOULDER ARTHROSCOPY WITH DISTAL CLAVICLE RESECTION (Right Shoulder) SUBACROMIAL DECOMPRESSION (Right Shoulder)  Patient Location: PACU  Anesthesia Type:GA combined with regional for post-op pain  Level of Consciousness: awake, alert , oriented and patient cooperative  Airway & Oxygen Therapy: Patient Spontanous Breathing and Patient connected to face mask oxygen  Post-op Assessment: Report given to RN and Post -op Vital signs reviewed and stable  Post vital signs: Reviewed and stable  Last Vitals:  Vitals Value Taken Time  BP    Temp    Pulse    Resp    SpO2      Last Pain:  Vitals:   10/29/18 1039  TempSrc: Oral  PainSc: 3       Patients Stated Pain Goal: 3 ([id number])  Complications: No apparent anesthesia complications

## 2018-10-29 NOTE — Op Note (Signed)
Orthopaedic Surgery Operative Note (CSN: [id number])  Mary Cruz  1975/03/04 Date of Surgery: 10/29/2018   Diagnoses:  R shoulder AC arthrosis and pain  Procedure: Right arthroscopic extensive debridement with biceps tenotomy Right distal clavicle excision Right subacromial decompression   Operative Finding Exam under anesthesia: Full motion no instability, no limitation Articular space: No loose bodies, capsule intact, labrum frayed anteriorly Chondral surfaces:Intact, no sign of chondral degeneration on the glenoid or humeral head Biceps: type 2 slap tear Subscapularis: normal Superior Cuff:normal Bursal side: normal  Successful completion of the planned procedure.  No issues, 8mm distal clavicle resection   Post-operative plan: The patient will be nwb in sling for 1 week w PT to start after first visit.  The patient will be DC home.  DVT prophylaxis not indicated in ambulatory upper extremity patient without known risk factors.   Pain control with PRN pain medication preferring oral medicines.  Follow up plan will be scheduled in approximately 7 days for incision check and XR.  Post-Op Diagnosis: Same Surgeons:Primary: Hiram Gash, MD Assistants: Location: [facility] OR ROOM 2 Anesthesia: General with Exparel interscalene block Antibiotics: Ancef 2g preop Tourniquet time: * No tourniquets in log * Estimated Blood Loss: Minimal Complications: None Specimens: None Implants: * No implants in log *  Indications for Surgery:   Mary Cruz is a 43 y.o. female with continued shoulder pain refractory to nonoperative measures for extended period of time.    The risks and benefits were explained at length including but not limited to continued pain, cuff failure, biceps tenodesis failure, stiffness, need for further surgery and infection.   Procedure:   Patient was correctly identified in the preoperative holding area and operative site marked.  Patient brought to OR and  positioned beachchair on an [place] table ensuring that all bony prominences were padded and the head was in an appropriate location.  Anesthesia was induced and the operative shoulder was prepped and draped in the usual sterile fashion.  Timeout was called preincision.  A standard posterior viewing portal was made after localizing the portal with a spinal needle.  An anterior accessory portal was also made.  After clearing the articular space the camera was positioned in the subacromial space.  Findings above.  Extensive debridement of articular space and tenotomy of biceps tendon performed includign labrum, biceps stump and bursa.  Subacromial decompression: We made a lateral portal with spinal needle guidance. We then proceeded to debride bursal tissue extensively with a shaver and arthrocare device. At that point we continued to identify the borders of the acromion and identify the spur. We then carefully preserved the deltoid fascia and used a burr to convert the type 2 acromion to a Type 1 flat acromion without issue.  Distal Clavicle resection:  The scope was placed in the subacromial space from the posterior portal.  A hemostat was placed through the anterior portal and we spread at the [facility] joint.  A burr was then inserted and 10 mm of distal clavicle was resected taking care to avoid damage to the capsule around the joint and avoiding overhanging bone posteriorly.    The incisions were closed with absorbable monocryl and steri strips.  A sterile dressing was placed along with a sling. The patient was awoken from general anesthesia and taken to the PACU in stable condition without complication.

## 2018-10-29 NOTE — Discharge Instructions (Signed)
Post Anesthesia Home Care Instructions  Activity: Get plenty of rest for the remainder of the day. A responsible individual must stay with you for 24 hours following the procedure.  For the next 24 hours, DO NOT: -Drive a car -Operate machinery -Drink alcoholic beverages -Take any medication unless instructed by your physician -Make any legal decisions or sign important papers.  Meals: Start with liquid foods such as gelatin or soup. Progress to regular foods as tolerated. Avoid greasy, spicy, heavy foods. If nausea and/or vomiting occur, drink only clear liquids until the nausea and/or vomiting subsides. Call your physician if vomiting continues.  Special Instructions/Symptoms: Your throat may feel dry or sore from the anesthesia or the breathing tube placed in your throat during surgery. If this causes discomfort, gargle with warm salt water. The discomfort should disappear within 24 hours.  If you had a scopolamine patch placed behind your ear for the management of post- operative nausea and/or vomiting:  1. The medication in the patch is effective for 72 hours, after which it should be removed.  Wrap patch in a tissue and discard in the trash. Wash hands thoroughly with soap and water. 2. You may remove the patch earlier than 72 hours if you experience unpleasant side effects which may include dry mouth, dizziness or visual disturbances. 3. Avoid touching the patch. Wash your hands with soap and water after contact with the patch.      Regional Anesthesia Blocks  1. Numbness or the inability to move the "blocked" extremity may last from 3-48 hours after placement. The length of time depends on the medication injected and your individual response to the medication. If the numbness is not going away after 48 hours, call your surgeon.  2. The extremity that is blocked will need to be protected until the numbness is gone and the  Strength has returned. Because you cannot feel it, you  will need to take extra care to avoid injury. Because it may be weak, you may have difficulty moving it or using it. You may not know what position it is in without looking at it while the block is in effect.  3. For blocks in the legs and feet, returning to weight bearing and walking needs to be done carefully. You will need to wait until the numbness is entirely gone and the strength has returned. You should be able to move your leg and foot normally before you try and bear weight or walk. You will need someone to be with you when you first try to ensure you do not fall and possibly risk injury.  4. Bruising and tenderness at the needle site are common side effects and will resolve in a few days.  5. Persistent numbness or new problems with movement should be communicated to the surgeon or the [facility] ([phone])/ [facility] ([phone]).  Information for Discharge Teaching: EXPAREL (bupivacaine liposome injectable suspension)   Your surgeon or anesthesiologist gave you EXPAREL(bupivacaine) to help control your pain after surgery.   EXPAREL is a local anesthetic that provides pain relief by numbing the tissue around the surgical site.  EXPAREL is designed to release pain medication over time and can control pain for up to 72 hours.  Depending on how you respond to EXPAREL, you may require less pain medication during your recovery.  Possible side effects:  Temporary loss of sensation or ability to move in the area where bupivacaine was injected.  Nausea, vomiting, constipation  Rarely,   numbness and tingling in your mouth or lips, lightheadedness, or anxiety may occur.  Call your doctor right away if you think you may be experiencing any of these sensations, or if you have other questions regarding possible side effects.  Follow all other discharge instructions given to you by your surgeon or nurse. Eat a healthy diet and drink plenty of water or other  fluids.  If you return to the hospital for any reason within 96 hours following the administration of EXPAREL, it is important for health care providers to know that you have received this anesthetic. A teal colored band has been placed on your arm with the date, time and amount of EXPAREL you have received in order to alert and inform your health care providers. Please leave this armband in place for the full 96 hours following administration, and then you may remove the band. 

## 2018-10-29 NOTE — Anesthesia Preprocedure Evaluation (Addendum)
Anesthesia Evaluation  Patient identified by MRN, date of birth, ID band Patient awake    Reviewed: Allergy & Precautions, NPO status , Patient's Chart, lab work & pertinent test results  Airway Mallampati: III  TM Distance: >3 FB Neck ROM: Full    Dental  (+) Teeth Intact, Dental Advisory Given   Pulmonary asthma , former smoker,    breath sounds clear to auscultation       Cardiovascular hypertension, Pt. on medications  Rhythm:Regular Rate:Normal     Neuro/Psych Anxiety Depression    GI/Hepatic GERD  Medicated,  Endo/Other    Renal/GU      Musculoskeletal  (+) Arthritis , Fibromyalgia -  Abdominal (+) + obese,   Peds  Hematology   Anesthesia Other Findings - Lupus  Reproductive/Obstetrics                            Lab Results  Component Value Date   WBC 7.3 10/24/2016   HGB 11.8 10/24/2016   HCT 35.6 10/24/2016   MCV 85.2 10/24/2016   PLT 285 10/24/2016   Lab Results  Component Value Date   CREATININE 1.00 10/26/2018   BUN 10 10/26/2018   NA 137 10/26/2018   K 4.1 10/26/2018   CL 102 10/26/2018   CO2 25 10/26/2018   COVID-19 Labs  No results for input(s): DDIMER, FERRITIN, LDH, CRP in the last 72 hours.  Lab Results  Component Value Date   SARSCOV2NAA NOT DETECTED 10/26/2018     Anesthesia Physical Anesthesia Plan  ASA: III  Anesthesia Plan: General   Post-op Pain Management: GA combined w/ Regional for post-op pain   Induction: Intravenous  PONV Risk Score and Plan: 4 or greater and Ondansetron, Dexamethasone, Midazolam and Scopolamine patch - Pre-op  Airway Management Planned: Oral ETT  Additional Equipment: None  Intra-op Plan:   Post-operative Plan: Extubation in OR  Informed Consent: I have reviewed the patients History and Physical, chart, labs and discussed the procedure including the risks, benefits and alternatives for the proposed anesthesia  with the patient or authorized representative who has indicated his/her understanding and acceptance.     Dental advisory given  Plan Discussed with: CRNA  Anesthesia Plan Comments:        Anesthesia Quick Evaluation

## 2018-10-29 NOTE — Anesthesia Procedure Notes (Signed)
Procedure Name: Intubation Date/Time: 10/29/2018 11:31 AM Performed by: Signe Colt, CRNA Pre-anesthesia Checklist: Patient identified, Emergency Drugs available, Suction available and Patient being monitored Patient Re-evaluated:Patient Re-evaluated prior to induction Oxygen Delivery Method: Circle system utilized Preoxygenation: Pre-oxygenation with 100% oxygen Induction Type: IV induction Ventilation: Mask ventilation without difficulty Laryngoscope Size: Mac and 3 Grade View: Grade I Tube type: Oral Number of attempts: 1 Airway Equipment and Method: Stylet and Oral airway Placement Confirmation: ETT inserted through vocal cords under direct vision,  positive ETCO2 and breath sounds checked- equal and bilateral Tube secured with: Tape Dental Injury: Teeth and Oropharynx as per pre-operative assessment

## 2018-10-29 NOTE — Progress Notes (Signed)
AssistedDr. Hollis with right, ultrasound guided, interscalene  block. Side rails up, monitors on throughout procedure. See vital signs in flow sheet. Tolerated Procedure well.  

## 2018-10-29 NOTE — H&P (Signed)
PREOPERATIVE H&P  Chief Complaint: CARTILAGE DISORDER RIGHT SHOULDER,IMPINGEMENT SYNDROME OF RIGHT SHOULDER  HPI: Mary Cruz is a 43 y.o. female who presents for preoperative history and physical with a diagnosis of CARTILAGE DISORDER RIGHT SHOULDER,IMPINGEMENT SYNDROME OF RIGHT SHOULDER. Symptoms are rated as moderate to severe, and have been worsening.  This is significantly impairing activities of daily living.  Please see my clinic note for full details on this patient's care.  She has elected for surgical management.   Past Medical History:  Diagnosis Date  . Abscess of axillary fold 10/10/2013  . Acute bronchitis with asthma with acute exacerbation 01/23/2015  . Fibromyalgia   . Hypertension   . Lupus ([place])   . Osteoarthritis   . Pain    chronic regional pain syndrome  . Sepsis affecting skin 10/10/2013   Past Surgical History:  Procedure Laterality Date  . BREAST SURGERY    . CHOLECYSTECTOMY  2000  . ENDOMETRIAL ABLATION    . KNEE SURGERY    . KNEE SURGERY Right 02-2015  . OCCIPITAL NERVE STIMULATOR INSERTION    . SHOULDER SURGERY    . spinal cord stimulator    . SPINAL CORD STIMULATOR IMPLANT  2008  . SPINAL CORD STIMULATOR INSERTION N/A 06/21/2016   Procedure: LUMBAR SPINAL CORD STIMULATOR INSERTION;  Surgeon: Clydell Hakim, MD;  Location: [facility];  Service: Neurosurgery;  Laterality: N/A;  LUMBAR SPINAL CORD STIMULATOR INSERTION  . TUBAL LIGATION  2000   Social History   Socioeconomic History  . Marital status: Married    Spouse name: Not on file  . Number of children: Not on file  . Years of education: Not on file  . Highest education level: Not on file  Occupational History  . Not on file  Social Needs  . Financial resource strain: Not on file  . Food insecurity    Worry: Not on file    Inability: Not on file  . Transportation needs    Medical: Not on file    Non-medical: Not on file  Tobacco Use  . Smoking status: Former Smoker    Types: Cigarettes   . Smokeless tobacco: Former Systems developer    Quit date: 12/22/2017  Substance and Sexual Activity  . Alcohol use: No    Alcohol/week: 0.0 standard drinks  . Drug use: No  . Sexual activity: Yes    Partners: Male    Birth control/protection: Surgical  Lifestyle  . Physical activity    Days per week: Not on file    Minutes per session: Not on file  . Stress: Not on file  Relationships  . Social Herbalist on phone: Not on file    Gets together: Not on file    Attends religious service: Not on file    Active member of club or organization: Not on file    Attends meetings of clubs or organizations: Not on file    Relationship status: Not on file  Other Topics Concern  . Not on file  Social History Narrative  . Not on file   Family History  Problem Relation Age of Onset  . Depression Mother        maternal grandmother  . Asthma Mother   . Fibromyalgia Other        aunt  . Stroke Other        grandmother  . Thyroid disease Other        grandmother   Allergies  Allergen Reactions  .  Keflex [Cephalexin] Other (See Comments)    Nausea and causes uti's  . Amoxicillin Diarrhea and Nausea And Vomiting  . Celebrex [Celecoxib] Swelling    "Body swelling"  . Pentazocine Lactate Itching and Other (See Comments)    HALLUCINATIONS  . Bactrim [Sulfamethoxazole-Trimethoprim] Other (See Comments)    "Makes very sick"  . Doxycycline     UNSPECIFIED REACTION  "SICKNESS"  . Erythromycin Other (See Comments)    RESULTANT UTI  . Hydromorphone Hcl Other (See Comments)    HALLUCINATIONS  . Latex Itching  . Nabumetone Itching   Prior to Admission medications   Medication Sig Start Date End Date Taking? Authorizing Provider  amitriptyline (ELAVIL) 100 MG tablet Take 1 tablet (100 mg total) by mouth at bedtime. 08/26/18 11/24/18 Yes Milinda Pointer, MD  clonazePAM (KLONOPIN) 0.5 MG tablet Take 0.5-1 tablets (0.25-0.5 mg total) by mouth 2 (two) times daily as needed for anxiety.  08/04/18  Yes Emeterio Reeve, DO  DULoxetine (CYMBALTA) 60 MG capsule Take 1 capsule (60 mg total) by mouth 2 (two) times daily. 08/04/18  Yes Emeterio Reeve, DO  lisinopril-hydrochlorothiazide (ZESTORETIC) 10-12.5 MG tablet Take 0.5 tablets by mouth daily. 08/04/18  Yes Emeterio Reeve, DO  methocarbamol (ROBAXIN) 750 MG tablet Take 1 tablet (750 mg total) by mouth every 8 (eight) hours as needed for muscle spasms. 08/26/18 11/24/18 Yes Milinda Pointer, MD  omeprazole (PRILOSEC) 40 MG capsule Take 1 capsule (40 mg total) by mouth daily. Patient taking differently: Take 700 mg by mouth daily.  10/24/16  Yes Emeterio Reeve, DO  oxyCODONE (OXY IR/ROXICODONE) 5 MG immediate release tablet Take 1 tablet (5 mg total) by mouth every 6 (six) hours as needed for severe pain. Must last 30 days 10/25/18 11/24/18 Yes Milinda Pointer, MD  acetaminophen (TYLENOL) 500 MG tablet Take 2 tablets (1,000 mg total) by mouth every 8 (eight) hours for 14 days. 10/29/18 11/12/18  Hiram Gash, MD  albuterol (PROVENTIL HFA;VENTOLIN HFA) 108 (90 Base) MCG/ACT inhaler Inhale 1-2 puffs into the lungs every 6 (six) hours as needed for wheezing or shortness of breath. 03/04/16   Fransico Meadow, PA-C  meloxicam (MOBIC) 7.5 MG tablet Take 1 tablet (7.5 mg total) by mouth daily. 10/29/18 10/29/19  Hiram Gash, MD  ondansetron (ZOFRAN) 4 MG tablet Take 1 tablet (4 mg total) by mouth every 8 (eight) hours as needed for up to 7 days for nausea or vomiting. 10/29/18 11/05/18  Hiram Gash, MD  oxyCODONE (OXY IR/ROXICODONE) 5 MG immediate release tablet Take 1 tablet (5 mg total) by mouth every 6 (six) hours as needed for severe pain. Must last 30 days 08/26/18 09/25/18  Milinda Pointer, MD  oxyCODONE (OXY IR/ROXICODONE) 5 MG immediate release tablet Take 1 tablet (5 mg total) by mouth every 6 (six) hours as needed for severe pain. Must last 30 days 09/25/18 10/25/18  Milinda Pointer, MD  oxyCODONE (OXY IR/ROXICODONE) 5 MG  immediate release tablet Take 1-2 pills every 6 hrs as needed for pain, no more than 6 per day 10/29/18 11/03/18  Hiram Gash, MD     Positive ROS: All other systems have been reviewed and were otherwise negative with the exception of those mentioned in the HPI and as above.  Physical Exam: General: Alert, no acute distress Cardiovascular: No pedal edema Respiratory: No cyanosis, no use of accessory musculature GI: No organomegaly, abdomen is soft and non-tender Skin: No lesions in the area of chief complaint Neurologic: Sensation intact distally Psychiatric: Patient  is competent for consent with normal mood and affect Lymphatic: No axillary or cervical lymphadenopathy  MUSCULOSKELETAL: R shoulder +AC ttp, + obrien  Assessment: CARTILAGE DISORDER RIGHT SHOULDER,IMPINGEMENT SYNDROME OF RIGHT SHOULDER  Plan: Plan for Procedure(s): SHOULDER ARTHROSCOPY WITH DISTAL CLAVICLE RESECTION SUBACROMIAL DECOMPRESSION SHOULDER ACROMIOPLASTY AND POSSIBLE BICEP TENODESIS  The risks benefits and alternatives were discussed with the patient including but not limited to the risks of nonoperative treatment, versus surgical intervention including infection, bleeding, nerve injury,  blood clots, cardiopulmonary complications, morbidity, mortality, among others, and they were willing to proceed.   Hiram Gash, MD  10/29/2018 11:14 AM

## 2018-11-02 ENCOUNTER — Encounter (HOSPITAL_BASED_OUTPATIENT_CLINIC_OR_DEPARTMENT_OTHER): Payer: Self-pay | Admitting: Orthopaedic Surgery

## 2018-11-12 ENCOUNTER — Encounter: Payer: Self-pay | Admitting: Pain Medicine

## 2018-11-15 DIAGNOSIS — M5030 Other cervical disc degeneration, unspecified cervical region: Secondary | ICD-10-CM

## 2018-11-15 DIAGNOSIS — M542 Cervicalgia: Secondary | ICD-10-CM | POA: Insufficient documentation

## 2018-11-15 DIAGNOSIS — R936 Abnormal findings on diagnostic imaging of limbs: Secondary | ICD-10-CM

## 2018-11-15 HISTORY — DX: Other cervical disc degeneration, unspecified cervical region: M50.30

## 2018-11-15 HISTORY — DX: Cervicalgia: M54.2

## 2018-11-15 HISTORY — DX: Abnormal findings on diagnostic imaging of limbs: R93.6

## 2018-11-15 NOTE — Progress Notes (Signed)
Pain Management Virtual Encounter Note - Virtual Visit via Telephone Telehealth (real-time audio visits between healthcare provider and patient).   Patient's Phone No. & Preferred Pharmacy:  [phone] (home); [phone] (mobile); (Preferred) [phone] [email or web address]  [id number] - [place], [place] - [address] [place] [address] Phone: [phone] Fax: [phone]    Pre-screening note:  Our staff contacted Ms. Forestier and offered her an "in person", "face-to-face" appointment versus a telephone encounter. She indicated preferring the telephone encounter, at this time.   Reason for Virtual Visit: COVID-19*  Social distancing based on CDC and AMA recommendations.   I contacted YAHIRA DEMERCHANT on 11/16/2018 via telephone.      I clearly identified myself as Gaspar Cola, MD. I verified that I was speaking with the correct person using two identifiers (Name: GLENDALY ROLLIE, and date of birth: [birth year]).  Advanced Informed Consent I sought verbal advanced consent from Emelia Loron for virtual visit interactions. I informed Ms. Starzyk of possible security and privacy concerns, risks, and limitations associated with providing "not-in-person" medical evaluation and management services. I also informed Ms. Dahlman of the availability of "in-person" appointments. Finally, I informed her that there would be a charge for the virtual visit and that she could be  personally, fully or partially, financially responsible for it. Ms. Schettino expressed understanding and agreed to proceed.   Historic Elements   Ms. FALEN BAGNALL is a 43 y.o. year old, female patient evaluated today after her last encounter by our practice on 08/20/2018. Ms. Beu  has a past medical history of Abscess of axillary fold (10/10/2013), Acute bronchitis with asthma with acute exacerbation (01/23/2015), Fibromyalgia, Hypertension, Lupus ([place]), Osteoarthritis,  Pain, and Sepsis affecting skin (10/10/2013). She also  has a past surgical history that includes spinal cord stimulator; Occipital nerve stimulator insertion; Cholecystectomy (2000); Knee surgery; Shoulder surgery; Tubal ligation (2000); Endometrial ablation; Spinal cord stimulator implant (2008); Knee surgery (Right, 02-2015); Breast surgery; Spinal cord stimulator insertion (N/A, 06/21/2016); Shoulder arthroscopy with distal clavicle resection (Right, 10/29/2018); and Subacromial decompression (Right, 10/29/2018). Ms. Clyburn has a current medication list which includes the following prescription(s): albuterol, amitriptyline, clonazepam, duloxetine, lisinopril-hydrochlorothiazide, meloxicam, methocarbamol, omeprazole, oxycodone, oxycodone, oxycodone, and oxycodone. She  reports that she has quit smoking. Her smoking use included cigarettes. She quit smokeless tobacco use about 10 months ago. She reports that she does not drink alcohol or use drugs. Ms. Seguin is allergic to keflex [cephalexin]; amoxicillin; celebrex [celecoxib]; pentazocine lactate; bactrim [sulfamethoxazole-trimethoprim]; doxycycline; erythromycin; hydromorphone hcl; latex; and nabumetone.   HPI  Today, she is being contacted for medication management.  The patient indicates doing well with the current medication regimen. No adverse reactions or side effects reported to the medications.   Pharmacotherapy Assessment  Analgesic: Oxycodone IR 5 mg, 1 tab PO q 6 hrs (20 mg/day of oxycodone) MME/day:30 mg/day.   Monitoring: Pharmacotherapy: No side-effects or adverse reactions reported. [place] PMP: PDMP reviewed during this encounter.       Compliance: No problems identified. Effectiveness: Clinically acceptable. Plan: Refer to "POC".  UDS:  Summary  Date Value Ref Range Status  02/13/2018 FINAL  Final    Comment:    ==================================================================== TOXASSURE SELECT 13  (MW) ==================================================================== Test                             Result       Flag       Units  Drug Present and Declared for Prescription Verification   Oxycodone                      2035         EXPECTED   ng/mg creat   Oxymorphone                    564          EXPECTED   ng/mg creat   Noroxycodone                   1930         EXPECTED   ng/mg creat   Noroxymorphone                 142          EXPECTED   ng/mg creat    Sources of oxycodone are scheduled prescription medications.    Oxymorphone, noroxycodone, and noroxymorphone are expected    metabolites of oxycodone. Oxymorphone is also available as a    scheduled prescription medication. Drug Absent but Declared for Prescription Verification   Clonazepam                     Not Detected UNEXPECTED ng/mg creat ==================================================================== Test                      Result    Flag   Units      Ref Range   Creatinine              103              mg/dL      >=20 ==================================================================== Declared Medications:  The flagging and interpretation on this report are based on the  following declared medications.  Unexpected results may arise from  inaccuracies in the declared medications.  **Note: The testing scope of this panel includes these medications:  Clonazepam (Klonopin)  Oxycodone (Roxicodone)  **Note: The testing scope of this panel does not include following  reported medications:  Albuterol  Amitriptyline (Elavil)  Cholecalciferol  Diclofenac  Duloxetine (Cymbalta)  Esomeprazole  Methocarbamol (Robaxin)  Naproxen  Omeprazole (Prilosec)  Prednisone  Pregabalin (Lyrica)  Quetiapine (Seroquel) ==================================================================== For clinical consultation, please call [phone]. ====================================================================     Laboratory Chemistry Profile (12 mo)  Renal: 10/26/2018: BUN 10; Creatinine, Ser 1.00  Lab Results  Component Value Date   GFRAA >60 10/26/2018   GFRNONAA >60 10/26/2018   Hepatic: No results found for requested labs within last 8760 hours. Lab Results  Component Value Date   AST 18 10/24/2016   ALT 16 10/24/2016   Other: No results found for requested labs within last 8760 hours. Note: Above Lab results reviewed.  Imaging  Last 90 days:  Mr Shoulder Right Wo Contrast  Result Date: 10/11/2018 CLINICAL DATA:  Right arm weakness and right shoulder pain. Scapular fracture at the time of a horseback riding accident on July 24, 2018. EXAM: MRI OF THE RIGHT SHOULDER WITHOUT CONTRAST TECHNIQUE: Multiplanar, multisequence MR imaging of the shoulder was performed. No intravenous contrast was administered. COMPARISON:  Radiographs of 07/24/2018 FINDINGS: Rotator cuff: Mild to moderate supraspinatus tendinopathy potentially with mild partial-thickness bursal surface tearing of the supraspinatus for example on image 10/8. Mild infraspinatus tendinopathy. Muscles:  Unremarkable Biceps long head:  Unremarkable Acromioclavicular Joint: Mild degenerative spurring with a small amount of fluid signal in the [facility] joint. Type  I acromion. Trace subacromial subdeltoid bursitis. Glenohumeral Joint: Mild degenerative chondral thinning. Potential mild laxity of the axillary pouch without a well-defined tear or obvious fluid leaking from the joint in this vicinity. Labrum:  Grossly unremarkable. Bones: Healing fracture the scapular body shown on image 11/8 with mild surrounding soft tissue edema. Other: No supplemental non-categorized findings. IMPRESSION: 1. Healing fracture of the scapular body. 2. Mild to moderate supraspinatus and mild infraspinatus tendinopathy. Suspected small partial thickness bursal surface tear of the supraspinatus but no full-thickness tear is identified. 3. Trace subacromial subdeltoid  bursitis. 4. Mild degenerative AC joint arthropathy and mild degenerative chondral thinning in the glenohumeral joint. 5. The axillary pouch is slightly lax, but I do not see a well-defined tear or obvious fluid leaking from the joint in this vicinity. Electronically Signed   By: Van Clines M.D.   On: 10/11/2018 09:48    Assessment  The primary encounter diagnosis was Chronic pain syndrome. Diagnoses of Chronic neck pain (Primary Area of Pain) (Bilateral) (L>R), DDD (degenerative disc disease), cervical, Cervicalgia, Abnormal MRI, shoulder, Closed fracture of scapula, sequela (Right), Fibromyalgia, Muscle cramping, Chronic musculoskeletal pain, and Neurogenic pain were also pertinent to this visit.  Plan of Care  I am having Emelia Loron start on oxyCODONE and oxyCODONE. I am also having her maintain her albuterol, omeprazole, clonazePAM, DULoxetine, lisinopril-hydrochlorothiazide, meloxicam, oxyCODONE, methocarbamol, amitriptyline, and oxyCODONE.  Pharmacotherapy (Medications Ordered): Meds ordered this encounter  Medications  . methocarbamol (ROBAXIN) 750 MG tablet    Sig: Take 1 tablet (750 mg total) by mouth every 8 (eight) hours as needed for muscle spasms.    Dispense:  90 tablet    Refill:  2    Fill one day early if pharmacy is closed on scheduled refill date. May substitute for generic if available.  [name] amitriptyline (ELAVIL) 100 MG tablet    Sig: Take 1 tablet (100 mg total) by mouth at bedtime.    Dispense:  30 tablet    Refill:  2    Fill one day early if pharmacy is closed on scheduled refill date. May substitute for generic if available.  [name] oxyCODONE (OXY IR/ROXICODONE) 5 MG immediate release tablet    Sig: Take 1 tablet (5 mg total) by mouth every 6 (six) hours as needed for severe pain. Must last 30 days    Dispense:  120 tablet    Refill:  0    Chronic Pain: STOP Act (Not applicable) Fill 1 day early if closed on refill date. Do not fill until: 11/24/2018. To  last until: 12/24/2018. Avoid benzodiazepines within 8 hours of opioids  . oxyCODONE (OXY IR/ROXICODONE) 5 MG immediate release tablet    Sig: Take 1 tablet (5 mg total) by mouth every 6 (six) hours as needed for severe pain. Must last 30 days    Dispense:  120 tablet    Refill:  0    Chronic Pain: STOP Act (Not applicable) Fill 1 day early if closed on refill date. Do not fill until: 12/24/2018. To last until: 01/23/2019. Avoid benzodiazepines within 8 hours of opioids  . oxyCODONE (OXY IR/ROXICODONE) 5 MG immediate release tablet    Sig: Take 1 tablet (5 mg total) by mouth every 6 (six) hours as needed for severe pain. Must last 30 days    Dispense:  120 tablet    Refill:  0    Chronic Pain: STOP Act (Not applicable) Fill 1 day early if closed on refill date. Do not fill  until: 01/23/2019. To last until: 02/22/2019. Avoid benzodiazepines within 8 hours of opioids   Orders:  No orders of the defined types were placed in this encounter.  Follow-up plan:   Return in about 14 weeks (around 02/22/2019) for (VV), (MM).      Considering:   NOTE: PLAQUENIL Anticoagulation (Stop: 11 days) Diagnostic right suprascapular nerve block #1    Palliative PRN treatment(s):   Palliative right lumbar facet RFA #2 (last done on 12/01/2017) (by Dr. Holley Raring)  Palliative left lumbar facet RFA #2 (last done on 09/22/2017) (by Dr. Holley Raring)  Palliative left C7-T1 CESI  Palliative bilateral lumbar facet blocks  PRN SCS programming adjustments (2018 replacement by Dr. Clydell Hakim)    Recent Visits Date Type Provider Dept  08/20/18 Office Visit Milinda Pointer, MD [facility]  Showing recent visits within past 90 days and meeting all other requirements   Today's Visits Date Type Provider Dept  11/16/18 Office Visit Milinda Pointer, MD [facility]  Showing today's visits and meeting all other requirements   Future Appointments No visits were found meeting these conditions.  Showing  future appointments within next 90 days and meeting all other requirements   I discussed the assessment and treatment plan with the patient. The patient was provided an opportunity to ask questions and all were answered. The patient agreed with the plan and demonstrated an understanding of the instructions.  Patient advised to call back or seek an in-person evaluation if the symptoms or condition worsens.  Total duration of non-face-to-face encounter: 12 minutes.  Note by: Gaspar Cola, MD Date: 11/16/2018; Time: 1:17 PM  Note: This dictation was prepared with Dragon dictation. Any transcriptional errors that may result from this process are unintentional.  Disclaimer:  * Given the special circumstances of the COVID-19 pandemic, the federal government has announced that the Office for Civil Rights (OCR) will exercise its enforcement discretion and will not impose penalties on physicians using telehealth in the event of noncompliance with regulatory requirements under the [place] and [place] (HIPAA) in connection with the good faith provision of telehealth during the XX123456 national public health emergency. ([place])

## 2018-11-16 ENCOUNTER — Ambulatory Visit: Payer: BLUE CROSS/BLUE SHIELD | Attending: Pain Medicine | Admitting: Pain Medicine

## 2018-11-16 ENCOUNTER — Other Ambulatory Visit: Payer: Self-pay

## 2018-11-16 ENCOUNTER — Telehealth: Payer: Self-pay | Admitting: Pain Medicine

## 2018-11-16 DIAGNOSIS — R252 Cramp and spasm: Secondary | ICD-10-CM

## 2018-11-16 DIAGNOSIS — R936 Abnormal findings on diagnostic imaging of limbs: Secondary | ICD-10-CM

## 2018-11-16 DIAGNOSIS — M5030 Other cervical disc degeneration, unspecified cervical region: Secondary | ICD-10-CM

## 2018-11-16 DIAGNOSIS — G894 Chronic pain syndrome: Secondary | ICD-10-CM | POA: Diagnosis not present

## 2018-11-16 DIAGNOSIS — M542 Cervicalgia: Secondary | ICD-10-CM

## 2018-11-16 DIAGNOSIS — S42101S Fracture of unspecified part of scapula, right shoulder, sequela: Secondary | ICD-10-CM

## 2018-11-16 DIAGNOSIS — M792 Neuralgia and neuritis, unspecified: Secondary | ICD-10-CM

## 2018-11-16 DIAGNOSIS — M7918 Myalgia, other site: Secondary | ICD-10-CM

## 2018-11-16 DIAGNOSIS — G8929 Other chronic pain: Secondary | ICD-10-CM

## 2018-11-16 DIAGNOSIS — M797 Fibromyalgia: Secondary | ICD-10-CM

## 2018-11-16 MED ORDER — AMITRIPTYLINE HCL 100 MG PO TABS: 100.0000 mg | ORAL_TABLET | Freq: Every day | ORAL | 2 refills | Status: DC

## 2018-11-16 MED ORDER — OXYCODONE HCL 5 MG PO TABS: 5.0000 mg | ORAL_TABLET | Freq: Four times a day (QID) | ORAL | 0 refills | Status: DC | PRN

## 2018-11-16 MED ORDER — METHOCARBAMOL 750 MG PO TABS: 750.0000 mg | ORAL_TABLET | Freq: Three times a day (TID) | ORAL | 2 refills | Status: DC | PRN

## 2018-11-16 NOTE — Telephone Encounter (Signed)
Called Pharmacy and vertified that is was OK to proceed with Elavil. She states it increases level of amitriptyline. Read MD notes from today and he is continuing that medsication.

## 2018-11-16 NOTE — Telephone Encounter (Signed)
Pharmacy called stating they need documentation from our office that Dr. Dossie Arbour is aware of the interaction between the Elavil and Cymbalta. Please contact pharmacy. They are holding script until they hear from our office

## 2018-12-21 ENCOUNTER — Encounter: Payer: Self-pay | Admitting: Physician Assistant

## 2018-12-21 ENCOUNTER — Ambulatory Visit (INDEPENDENT_AMBULATORY_CARE_PROVIDER_SITE_OTHER): Payer: BLUE CROSS/BLUE SHIELD | Admitting: Physician Assistant

## 2018-12-21 VITALS — BP 135/75 | Ht 64.0 in | Wt 260.0 lb

## 2018-12-21 DIAGNOSIS — H9203 Otalgia, bilateral: Secondary | ICD-10-CM | POA: Diagnosis not present

## 2018-12-21 MED ORDER — METHYLPREDNISOLONE 4 MG PO TBPK: ORAL_TABLET | ORAL | 0 refills | Status: DC

## 2018-12-21 NOTE — Progress Notes (Signed)
Patient ID: Mary Cruz, female   DOB: 12-Apr-1975, 43 y.o.   MRN: [id number] .Mary KitchenVirtual Visit via Video Note  I connected with Mary Cruz on 12/21/18 at 10:10 AM EST by a video enabled telemedicine application and verified that I am speaking with the correct person using two identifiers.  Location: Patient: home Provider: clinic   I discussed the limitations of evaluation and management by telemedicine and the availability of in person appointments. The patient expressed understanding and agreed to proceed.  History of Present Illness: Pt is a 43 yo female who calls into the clinic to follow up on bilateral ear pain. She was seen at [facility] on Thursday, 5 days ago. She was treated for bilateral otitis media and sinus infection with levaquin and zyrtec. She started to improve but then her bilateral ears continue to feel "full of pressure". Her sinus pressure feels some better.  She denies any ear drainage. She denies any recent travel. No fever, chills, loss of smell or taste, SOB. She is coughing.   .. Active Ambulatory Problems    Diagnosis Date Noted  . GERD 07/02/2007  . Irritable bowel syndrome 07/02/2007  . KIDNEY STONES 07/02/2007  . Essential hypertension, benign 07/09/2013  . Chronic pain syndrome 07/09/2013  . Generalized anxiety disorder 03/16/2014  . Obesity 10/27/2014  . Long term current use of opiate analgesic 12/19/2014  . Long term prescription opiate use 12/19/2014  . Opiate use (30 MME/Day) 12/19/2014  . Opiate dependence ([place]) 12/19/2014  . Encounter for therapeutic drug level monitoring 12/19/2014  . Chronic musculoskeletal pain 12/19/2014  . Muscle cramping 12/19/2014  . Osteoarthrosis 12/19/2014  . Vitamin D insufficiency 12/19/2014  . 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 Encounter for management of implanted device 12/19/2014  . Chronic low back pain (Bilateral) (R>L) 12/19/2014  . Chronic lumbar  radicular pain (Right L5 dermatome; Left S1 Dermatome) (Bilateral) (R>L) 12/19/2014  . Chronic neck pain (Primary Area of Pain) (Bilateral) (L>R) 12/19/2014  . Morbid obesity ([place]) 12/19/2014  . Nicotine dependence 12/19/2014  . Chronic cervical radicular pain (C7 Dermatome) (Bilateral) (L>R) 12/19/2014  . Chronic lower extremity pain (Bilateral) (R>L) 12/19/2014  . Right knee pain 01/10/2015  . Anxiety and depression 01/20/2015  . Implant pain at battery site (right buttocks area) 04/05/2015  . Lumbar spondylosis 06/29/2015  . Lumbar facet syndrome (Bilateral) (R>L) 06/29/2015  . Positive ANA (antinuclear antibody) 09/13/2015  . Spinal cord stimulator status 05/16/2016  . Battery end of life of spinal cord stimulator 05/16/2016  . Neurogenic pain 10/10/2016  . Fibromyalgia 10/10/2016  . Family history of thyroid disease 10/24/2016  . Pain in right knee 03/19/2017  . Chronic upper extremity pain (Secondary area of Pain) (Bilateral) (L>R) 08/20/2018  . Chronic hip pain (Third area of Pain) (Bilateral) (R>L) 08/20/2018  . Closed fracture of scapula, sequela (Right) 08/20/2018  . Pharmacologic therapy 08/20/2018  . Disorder of skeletal system 08/20/2018  . Problems influencing health status 08/20/2018  . DDD (degenerative disc disease), cervical 11/15/2018  . Cervicalgia 11/15/2018  . Abnormal MRI, shoulder (Right) 11/15/2018   Resolved Ambulatory Problems    Diagnosis Date Noted  . Sepsis affecting skin ([place]) 10/10/2013  . Abscess of axillary fold 10/10/2013  . Acute bronchitis with asthma with acute exacerbation 01/23/2015   Past Medical History:  Diagnosis Date  . Hypertension   . Lupus ([place])   . Osteoarthritis   . Pain    Reviewed med, allergy, problem  list.     Observations/Objective: No acute distress. Dry cough with no labored breathing.  Normal mood.   .. Today's Vitals   12/21/18 0942  BP: 135/75  Weight: 260 lb (117.9 kg)  Height: 5\' 4"  (1.626 m)   Body  mass index is 44.63 kg/m.   Assessment and Plan: Mary KitchenMarland KitchenLondan was seen today for ear problem.  Diagnoses and all orders for this visit:  Acute ear pain, bilateral -     methylPREDNISolone (MEDROL DOSEPAK) 4 MG TBPK tablet; Take as directed by package insert.   Continue on levaquin and zyrtec. Start medrol dose pack for ETD. Consider flonase 2 sprays each nostril. Follow up as needed.    Follow Up Instructions:    I discussed the assessment and treatment plan with the patient. The patient was provided an opportunity to ask questions and all were answered. The patient agreed with the plan and demonstrated an understanding of the instructions.   The patient was advised to call back or seek an in-person evaluation if the symptoms worsen or if the condition fails to improve as anticipated.   Mary Planas, PA-C

## 2018-12-21 NOTE — Progress Notes (Deleted)
Went last Thursday - urgent care  Sinus and Ear infection Left ear started hurting again last night, right ear started hurting Coughing Still taking Levaquin and Zyrtec

## 2019-02-16 ENCOUNTER — Other Ambulatory Visit: Payer: Self-pay | Admitting: Osteopathic Medicine

## 2019-02-16 NOTE — Progress Notes (Signed)
Virtual Encounter - Pain Management PROVIDER NOTE: Information contained herein reflects review and annotations entered in association with encounter. Interpretation of such information and data should be left to medically-trained personnel. Information provided to patient can be located elsewhere in the medical record under "Patient Instructions". Document created using STT-dictation technology, any transcriptional errors that may result from process are unintentional.    Contact & Pharmacy Preferred: [phone] Home: [phone] (home) Mobile: [phone] (mobile) E-mail: [email or web address]  [id number] - [place], [place] - [address] [place] [address] Phone: [phone] Fax: [phone]   Pre-screening  Mary Cruz offered "in-person" vs "virtual" encounter. She indicated preferring virtual for this encounter.   Reason COVID-19*  Social distancing based on CDC and AMA recommendations.   I contacted AZIZAH GILES on 02/17/2019 via telephone.      I clearly identified myself as Gaspar Cola, MD. I verified that I was speaking with the correct person using two identifiers (Name: Mary Cruz, and date of birth: 07/21/1975).  Consent I sought verbal advanced consent from Mary Cruz for virtual visit interactions. I informed Mary Cruz of possible security and privacy concerns, risks, and limitations associated with providing "not-in-person" medical evaluation and management services. I also informed Mary Cruz of the availability of "in-person" appointments. Finally, I informed her that there would be a charge for the virtual visit and that she could be  personally, fully or partially, financially responsible for it. Mary Cruz expressed understanding and agreed to proceed.   Historic Elements   Mary Cruz is a 44 y.o. year old, female patient evaluated today after her last encounter by our practice on 11/16/2018. Ms.  Cruz  has a past medical history of Abscess of axillary fold (10/10/2013), Acute bronchitis with asthma with acute exacerbation (01/23/2015), Fibromyalgia, Hypertension, Lupus ([place]), Osteoarthritis, Pain, and Sepsis affecting skin (10/10/2013). She also  has a past surgical history that includes spinal cord stimulator; Occipital nerve stimulator insertion; Cholecystectomy (2000); Knee surgery; Shoulder surgery; Tubal ligation (2000); Endometrial ablation; Spinal cord stimulator implant (2008); Knee surgery (Right, 02-2015); Breast surgery; Spinal cord stimulator insertion (N/A, 06/21/2016); Shoulder arthroscopy with distal clavicle resection (Right, 10/29/2018); and Subacromial decompression (Right, 10/29/2018). Mary Cruz has a current medication list which includes the following prescription(s): albuterol, [START ON 02/22/2019] amitriptyline, clonazepam, duloxetine, lisinopril-hydrochlorothiazide, meloxicam, [START ON 02/22/2019] methocarbamol, methylprednisolone, omeprazole, [START ON 02/22/2019] oxycodone, [START ON 03/24/2019] oxycodone, and [START ON 04/23/2019] oxycodone. She  reports that she has quit smoking. Her smoking use included cigarettes. She quit smokeless tobacco use about 13 months ago. She reports that she does not drink alcohol or use drugs. Mary Cruz is allergic to keflex [cephalexin]; amoxicillin; celebrex [celecoxib]; pentazocine lactate; bactrim [sulfamethoxazole-trimethoprim]; doxycycline; erythromycin; hydromorphone hcl; latex; and nabumetone.   HPI  Today, she is being contacted for medication management.  The patient indicates doing well with the current medication regimen. No adverse reactions or side effects reported to the medications.  Although the patient indicates doing well on the medications and not having any side effects, she also reports that she is having a flareup of her low back pain and would like to come in for a palliative bilateral lumbar facet block.  She is well  acquainted with the symptoms of her lumbar facet syndrome and she can tell when she is having a flareup of that pain.  She has identified her current pain as coming from her facet joints and typically the facet blocks tend to work  in controlling the pain.  After further evaluation I have agreed with the patient and we will go ahead and put her on the schedule for a palliative bilateral lumbar facet block under fluoroscopic guidance and IV sedation.  She indicates no longer taking the Plaquenil.  Pharmacotherapy Assessment  Analgesic: Oxycodone IR 5 mg, 1 tab PO q 6 hrs (20 mg/day of oxycodone) MME/day:30 mg/day.   Monitoring: Pharmacotherapy: No side-effects or adverse reactions reported. [place] PMP: PDMP reviewed during this encounter.       Compliance: No problems identified. Effectiveness: Clinically acceptable. Plan: Refer to "POC".  UDS:  Summary  Date Value Ref Range Status  02/13/2018 FINAL  Final    Comment:    ==================================================================== TOXASSURE SELECT 13 (MW) ==================================================================== Test                             Result       Flag       Units Drug Present and Declared for Prescription Verification   Oxycodone                      2035         EXPECTED   ng/mg creat   Oxymorphone                    564          EXPECTED   ng/mg creat   Noroxycodone                   1930         EXPECTED   ng/mg creat   Noroxymorphone                 142          EXPECTED   ng/mg creat    Sources of oxycodone are scheduled prescription medications.    Oxymorphone, noroxycodone, and noroxymorphone are expected    metabolites of oxycodone. Oxymorphone is also available as a    scheduled prescription medication. Drug Absent but Declared for Prescription Verification   Clonazepam                     Not Detected UNEXPECTED ng/mg creat ==================================================================== Test                       Result    Flag   Units      Ref Range   Creatinine              103              mg/dL      >=20 ==================================================================== Declared Medications:  The flagging and interpretation on this report are based on the  following declared medications.  Unexpected results may arise from  inaccuracies in the declared medications.  **Note: The testing scope of this panel includes these medications:  Clonazepam (Klonopin)  Oxycodone (Roxicodone)  **Note: The testing scope of this panel does not include following  reported medications:  Albuterol  Amitriptyline (Elavil)  Cholecalciferol  Diclofenac  Duloxetine (Cymbalta)  Esomeprazole  Methocarbamol (Robaxin)  Naproxen  Omeprazole (Prilosec)  Prednisone  Pregabalin (Lyrica)  Quetiapine (Seroquel) ==================================================================== For clinical consultation, please call [phone]. ====================================================================    Laboratory Chemistry Profile (12 mo)  Renal: 10/26/2018: BUN 10; Creatinine, Ser 1.00  Lab Results  Component Value Date   GFRAA >  60 10/26/2018   GFRNONAA >60 10/26/2018   Hepatic: No results found for requested labs within last 8760 hours. Lab Results  Component Value Date   AST 18 10/24/2016   ALT 16 10/24/2016   Other: No results found for requested labs within last 8760 hours. Note: Above Lab results reviewed.  Imaging  MR SHOULDER RIGHT WO CONTRAST CLINICAL DATA:  Right arm weakness and right shoulder pain. Scapular fracture at the time of a horseback riding accident on July 24, 2018.  EXAM: MRI OF THE RIGHT SHOULDER WITHOUT CONTRAST  TECHNIQUE: Multiplanar, multisequence MR imaging of the shoulder was performed. No intravenous contrast was administered.  COMPARISON:  Radiographs of 07/24/2018  FINDINGS: Rotator cuff: Mild to moderate supraspinatus  tendinopathy potentially with mild partial-thickness bursal surface tearing of the supraspinatus for example on image 10/8. Mild infraspinatus tendinopathy.  Muscles:  Unremarkable  Biceps long head:  Unremarkable  Acromioclavicular Joint: Mild degenerative spurring with a small amount of fluid signal in the [facility] joint. Type I acromion. Trace subacromial subdeltoid bursitis.  Glenohumeral Joint: Mild degenerative chondral thinning. Potential mild laxity of the axillary pouch without a well-defined tear or obvious fluid leaking from the joint in this vicinity.  Labrum:  Grossly unremarkable.  Bones: Healing fracture the scapular body shown on image 11/8 with mild surrounding soft tissue edema.  Other: No supplemental non-categorized findings.  IMPRESSION: 1. Healing fracture of the scapular body. 2. Mild to moderate supraspinatus and mild infraspinatus tendinopathy. Suspected small partial thickness bursal surface tear of the supraspinatus but no full-thickness tear is identified. 3. Trace subacromial subdeltoid bursitis. 4. Mild degenerative AC joint arthropathy and mild degenerative chondral thinning in the glenohumeral joint. 5. The axillary pouch is slightly lax, but I do not see a well-defined tear or obvious fluid leaking from the joint in this vicinity.  Electronically Signed   By: Van Clines M.D.   On: 10/11/2018 09:48   Assessment  The primary encounter diagnosis was Lumbar facet syndrome (Bilateral) (R>L). Diagnoses of Chronic low back pain (Bilateral) (R>L), Chronic pain syndrome, Chronic neck pain (Primary Area of Pain) (Bilateral) (L>R), Cervicalgia, Chronic upper extremity pain (Secondary area of Pain) (Bilateral) (L>R), Fibromyalgia, Muscle cramping, Chronic musculoskeletal pain, and Neurogenic pain were also pertinent to this visit.  Plan of Care  Problem-specific:  No problem-specific Assessment & Plan notes found for this encounter.  I am having  Mary Cruz start on oxyCODONE and oxyCODONE. I am also having her maintain her albuterol, omeprazole, clonazePAM, meloxicam, methylPREDNISolone, DULoxetine, lisinopril-hydrochlorothiazide, methocarbamol, oxyCODONE, and amitriptyline.  Pharmacotherapy (Medications Ordered): Meds ordered this encounter  Medications  . methocarbamol (ROBAXIN) 750 MG tablet    Sig: Take 1 tablet (750 mg total) by mouth every 8 (eight) hours as needed for muscle spasms.    Dispense:  90 tablet    Refill:  5    Fill one day early if pharmacy is closed on scheduled refill date. May substitute for generic if available.  [name] oxyCODONE (OXY IR/ROXICODONE) 5 MG immediate release tablet    Sig: Take 1 tablet (5 mg total) by mouth every 6 (six) hours as needed for severe pain. Must last 30 days    Dispense:  120 tablet    Refill:  0    Chronic Pain: STOP Act (Not applicable) Fill 1 day early if closed on refill date. Do not fill until: 02/22/2019. To last until: 03/24/2019. Avoid benzodiazepines within 8 hours of opioids  . oxyCODONE (OXY IR/ROXICODONE) 5 MG immediate release  tablet    Sig: Take 1 tablet (5 mg total) by mouth every 6 (six) hours as needed for severe pain. Must last 30 days    Dispense:  120 tablet    Refill:  0    Chronic Pain: STOP Act (Not applicable) Fill 1 day early if closed on refill date. Do not fill until: 03/24/2019. To last until: 04/23/2019. Avoid benzodiazepines within 8 hours of opioids  . oxyCODONE (OXY IR/ROXICODONE) 5 MG immediate release tablet    Sig: Take 1 tablet (5 mg total) by mouth every 6 (six) hours as needed for severe pain. Must last 30 days    Dispense:  120 tablet    Refill:  0    Chronic Pain: STOP Act (Not applicable) Fill 1 day early if closed on refill date. Do not fill until: 04/23/2019. To last until: 05/23/2019. Avoid benzodiazepines within 8 hours of opioids  . amitriptyline (ELAVIL) 100 MG tablet    Sig: Take 1 tablet (100 mg total) by mouth at bedtime.     Dispense:  30 tablet    Refill:  5    Fill one day early if pharmacy is closed on scheduled refill date. May substitute for generic if available.   Orders:  Orders Placed This Encounter  Procedures  . L-FCT Blk (Schedule)    Standing Status:   Future    Standing Expiration Date:   03/20/2019    Scheduling Instructions:     Procedure: Lumbar facet block (AKA.: Lumbosacral medial branch nerve block)     Side: Bilateral     Level: L3-4, L4-5, & L5-S1 Facets (L2, L3, L4, L5, & S1 Medial Branch Nerves)     Sedation: Patient's choice.     Timeframe: ASAA    Order Specific Question:   Where will this procedure be performed?    Answer:   [facility]   Follow-up plan:   Return in about 13 weeks (around 05/19/2019) for (VV), (MM), in addition, Procedure (w/ sedation): (B) L-FCT BLK.      Interventional treatment options:  Under consideration:   Diagnostic right suprascapular nerve block #1    Therapeutic/palliative (PRN):   Palliative bilateral lumbar facet blocks  Palliative right lumbar facet RFA #2 (last done on 12/01/2017) (by Dr. Holley Raring)  Palliative left lumbar facet RFA #2 (last done on 09/22/2017) (by Dr. Holley Raring)  Palliative left C7-T1 CESI  PRN SCS programming adjustments (2018 replacement by Dr. Clydell Hakim)    Recent Visits No visits were found meeting these conditions.  Showing recent visits within past 90 days and meeting all other requirements   Today's Visits Date Type Provider Dept  02/17/19 Telemedicine Milinda Pointer, MD [facility]  Showing today's visits and meeting all other requirements   Future Appointments No visits were found meeting these conditions.  Showing future appointments within next 90 days and meeting all other requirements   I discussed the assessment and treatment plan with the patient. The patient was provided an opportunity to ask questions and all were answered. The patient agreed with the plan and demonstrated an  understanding of the instructions.  Patient advised to call back or seek an in-person evaluation if the symptoms or condition worsens.  Total duration of non-face-to-face encounter: 16 minutes.  Note by: Gaspar Cola, MD Date: 02/17/2019; Time: 12:17 PM

## 2019-02-16 NOTE — Telephone Encounter (Signed)
Requested medication (s) are due for refill today: yes  Requested medication (s) are on the active medication list: yes  Last refill:  01/12/2019  Future visit scheduled:no  Notes to clinic:  review for refill   Requested Prescriptions  Pending Prescriptions Disp Refills   DULoxetine (CYMBALTA) 60 MG capsule [Pharmacy Med Name: DULOXETINE HCL DR 60 MG CAP] 60 capsule 5    Sig: Take 1 capsule (60 mg total) by mouth 2 (two) times daily.      Psychiatry: Antidepressants - SNRI Failed - 02/16/2019  1:07 AM      Failed - Valid encounter within last 6 months    Recent Outpatient Visits           1 month ago Acute ear pain, bilateral   [facility], Royetta Car, PA-C   6 months ago Essential hypertension, benign   [facility], Lanelle Bal, DO   1 year ago Anxiety and depression   [facility], Lanelle Bal, DO   2 years ago Thyroid function study abnormality   [facility], Lanelle Bal, DO   2 years ago [facility], Rebekah Chester[place], MD       Future Appointments             Tomorrow Milinda Pointer, MD [facility] - Completed PHQ-2 or PHQ-9 in the last 360 days.      Passed - Last BP in normal range    BP Readings from Last 1 Encounters:  12/21/18 135/75            lisinopril-hydrochlorothiazide (ZESTORETIC) 10-12.5 MG tablet [Pharmacy Med Name: LISINOPRIL-HCTZ 10-12.5 MG TAB] 15 tablet 5    Sig: TAKE 1/2 TABLET BY MOUTH EVERY DAY      Cardiovascular:  ACEI + Diuretic Combos Failed - 02/16/2019  1:07 AM      Failed - Valid encounter within last 6 months    Recent Outpatient Visits           1 month ago Acute ear pain, bilateral   [facility], Royetta Car, PA-C   6 months ago Essential hypertension, benign   [facility], Lanelle Bal, DO   1 year ago Anxiety and depression   [facility], Lanelle Bal, DO   2 years ago Thyroid function study abnormality   [facility], Lanelle Bal, DO   2 years ago [place], Rebekah Chesterfield, MD       Future Appointments             Tomorrow Milinda Pointer, MD [facility]            Passed - Na in normal range and within 180 days    Sodium  Date Value Ref Range Status  10/26/2018 137 135 - 145 mmol/L Final  10/21/2012 138 136 - 145 mmol/L Final          Passed - K in normal range and within 180 days    Potassium  Date Value Ref Range Status  10/26/2018 4.1 3.5 - 5.1 mmol/L Final  10/21/2012 3.3 (L) 3.5 - 5.1 mmol/L Final          Passed - Cr in normal range and within 180 days    Creat  Date Value Ref Range Status  10/24/2016 1.01 0.50 - 1.10 mg/dL Final   Creatinine, Ser  Date Value Ref Range Status  10/26/2018 1.00 0.44 - 1.00 mg/dL Final          Passed - Ca in normal range and within 180 days    Calcium  Date Value Ref Range Status  10/26/2018 9.1 8.9 - 10.3 mg/dL Final   Calcium, Total  Date Value Ref Range Status  10/21/2012 9.2 8.5 - 10.1 mg/dL Final          Passed - Patient is not pregnant      Passed - Last BP in normal range    BP Readings from Last 1 Encounters:  12/21/18 135/75

## 2019-02-17 ENCOUNTER — Other Ambulatory Visit: Payer: Self-pay

## 2019-02-17 ENCOUNTER — Ambulatory Visit: Payer: BLUE CROSS/BLUE SHIELD | Attending: Pain Medicine | Admitting: Pain Medicine

## 2019-02-17 DIAGNOSIS — M7918 Myalgia, other site: Secondary | ICD-10-CM

## 2019-02-17 DIAGNOSIS — M47816 Spondylosis without myelopathy or radiculopathy, lumbar region: Secondary | ICD-10-CM

## 2019-02-17 DIAGNOSIS — M792 Neuralgia and neuritis, unspecified: Secondary | ICD-10-CM

## 2019-02-17 DIAGNOSIS — G894 Chronic pain syndrome: Secondary | ICD-10-CM | POA: Diagnosis not present

## 2019-02-17 DIAGNOSIS — M797 Fibromyalgia: Secondary | ICD-10-CM

## 2019-02-17 DIAGNOSIS — M545 Low back pain, unspecified: Secondary | ICD-10-CM

## 2019-02-17 DIAGNOSIS — R252 Cramp and spasm: Secondary | ICD-10-CM

## 2019-02-17 DIAGNOSIS — M542 Cervicalgia: Secondary | ICD-10-CM | POA: Diagnosis not present

## 2019-02-17 DIAGNOSIS — M79602 Pain in left arm: Secondary | ICD-10-CM

## 2019-02-17 DIAGNOSIS — M79601 Pain in right arm: Secondary | ICD-10-CM

## 2019-02-17 DIAGNOSIS — G8929 Other chronic pain: Secondary | ICD-10-CM

## 2019-02-17 MED ORDER — AMITRIPTYLINE HCL 100 MG PO TABS: 100.0000 mg | ORAL_TABLET | Freq: Every day | ORAL | 5 refills | Status: DC

## 2019-02-17 MED ORDER — OXYCODONE HCL 5 MG PO TABS: 5.0000 mg | ORAL_TABLET | Freq: Four times a day (QID) | ORAL | 0 refills | Status: DC | PRN

## 2019-02-17 MED ORDER — METHOCARBAMOL 750 MG PO TABS: 750.0000 mg | ORAL_TABLET | Freq: Three times a day (TID) | ORAL | 5 refills | Status: DC | PRN

## 2019-02-22 ENCOUNTER — Telehealth: Payer: BLUE CROSS/BLUE SHIELD | Admitting: Pain Medicine

## 2019-02-25 ENCOUNTER — Ambulatory Visit
Admission: RE | Admit: 2019-02-25 | Discharge: 2019-02-25 | Disposition: A | Discharge status: Home / Self Care | Payer: BLUE CROSS/BLUE SHIELD | Source: Ambulatory Visit | Attending: Pain Medicine | Admitting: Pain Medicine

## 2019-02-25 ENCOUNTER — Ambulatory Visit (HOSPITAL_BASED_OUTPATIENT_CLINIC_OR_DEPARTMENT_OTHER): Payer: BLUE CROSS/BLUE SHIELD | Admitting: Pain Medicine

## 2019-02-25 ENCOUNTER — Encounter: Payer: Self-pay | Admitting: Pain Medicine

## 2019-02-25 ENCOUNTER — Other Ambulatory Visit: Payer: Self-pay

## 2019-02-25 VITALS — BP 121/71 | HR 82 | Temp 98.0°F | Resp 14 | Ht 67.0 in | Wt 254.0 lb

## 2019-02-25 DIAGNOSIS — M545 Low back pain: Secondary | ICD-10-CM | POA: Diagnosis present

## 2019-02-25 DIAGNOSIS — M47816 Spondylosis without myelopathy or radiculopathy, lumbar region: Secondary | ICD-10-CM

## 2019-02-25 DIAGNOSIS — G8929 Other chronic pain: Secondary | ICD-10-CM | POA: Diagnosis present

## 2019-02-25 DIAGNOSIS — M5136 Other intervertebral disc degeneration, lumbar region: Secondary | ICD-10-CM | POA: Insufficient documentation

## 2019-02-25 DIAGNOSIS — M51369 Other intervertebral disc degeneration, lumbar region without mention of lumbar back pain or lower extremity pain: Secondary | ICD-10-CM

## 2019-02-25 DIAGNOSIS — M47817 Spondylosis without myelopathy or radiculopathy, lumbosacral region: Secondary | ICD-10-CM

## 2019-02-25 HISTORY — DX: Other intervertebral disc degeneration, lumbar region without mention of lumbar back pain or lower extremity pain: M51.369

## 2019-02-25 HISTORY — DX: Other intervertebral disc degeneration, lumbar region: M51.36

## 2019-02-25 HISTORY — DX: Spondylosis without myelopathy or radiculopathy, lumbosacral region: M47.817

## 2019-02-25 MED ORDER — TRIAMCINOLONE ACETONIDE 40 MG/ML IJ SUSP
INTRAMUSCULAR | Status: AC
  Filled 2019-02-25: qty 2

## 2019-02-25 MED ORDER — FENTANYL CITRATE (PF) 100 MCG/2ML IJ SOLN
INTRAMUSCULAR | Status: AC
  Filled 2019-02-25: qty 2

## 2019-02-25 MED ORDER — LIDOCAINE HCL 2 % IJ SOLN
INTRAMUSCULAR | Status: AC
  Filled 2019-02-25: qty 20

## 2019-02-25 MED ORDER — ROPIVACAINE HCL 2 MG/ML IJ SOLN
INTRAMUSCULAR | Status: AC
  Filled 2019-02-25: qty 20

## 2019-02-25 MED ORDER — ROPIVACAINE HCL 2 MG/ML IJ SOLN
18.0000 mL | Freq: Once | INTRAMUSCULAR | Status: AC
  Administered 2019-02-25: 10:00:00 18 mL via PERINEURAL

## 2019-02-25 MED ORDER — LIDOCAINE HCL 2 % IJ SOLN
20.0000 mL | Freq: Once | INTRAMUSCULAR | Status: AC
  Administered 2019-02-25: 400 mg

## 2019-02-25 MED ORDER — LACTATED RINGERS IV SOLN
1000.0000 mL | Freq: Once | INTRAVENOUS | Status: AC
  Administered 2019-02-25: 1000 mL via INTRAVENOUS

## 2019-02-25 MED ORDER — MIDAZOLAM HCL 5 MG/5ML IJ SOLN
INTRAMUSCULAR | Status: AC
  Filled 2019-02-25: qty 5

## 2019-02-25 MED ORDER — FENTANYL CITRATE (PF) 100 MCG/2ML IJ SOLN
25.0000 ug | INTRAMUSCULAR | Status: AC | PRN
  Administered 2019-02-25 (×2): 50 ug via INTRAVENOUS

## 2019-02-25 MED ORDER — TRIAMCINOLONE ACETONIDE 40 MG/ML IJ SUSP
80.0000 mg | Freq: Once | INTRAMUSCULAR | Status: AC
  Administered 2019-02-25: 10:00:00 80 mg

## 2019-02-25 MED ORDER — MIDAZOLAM HCL 5 MG/5ML IJ SOLN
1.0000 mg | INTRAMUSCULAR | Status: DC | PRN
  Administered 2019-02-25: 10:00:00 1 mg via INTRAVENOUS
  Administered 2019-02-25: 10:00:00 2 mg via INTRAVENOUS
  Administered 2019-02-25 (×2): 1 mg via INTRAVENOUS

## 2019-02-25 NOTE — Progress Notes (Signed)
Safety precautions to be maintained throughout the outpatient stay will include: orient to surroundings, keep bed in low position, maintain call bell within reach at all times, provide assistance with transfer out of bed and ambulation.  

## 2019-02-25 NOTE — Progress Notes (Signed)
PROVIDER NOTE: Information contained herein reflects review and annotations entered in association with encounter. Interpretation of such information and data should be left to medically-trained personnel. Information provided to patient can be located elsewhere in the medical record under "Patient Instructions". Document created using STT-dictation technology, any transcriptional errors that may result from process are unintentional.    Patient: Mary Cruz  Service Category: Procedure  Provider: Gaspar Cola, MD  DOB: 1975-12-28  DOS: 02/25/2019  Location: [facility]  MRN: [id number]  Setting: Ambulatory - outpatient  Referring Provider: Milinda Pointer, MD  Type: Established Patient  Specialty: Interventional Pain Management  PCP: Emeterio Reeve, DO   Primary Reason for Visit: Interventional Pain Management Treatment. CC: Back Pain  Procedure:          Anesthesia, Analgesia, Anxiolysis:  Type: Lumbar Facet, Medial Branch Block(s)          Primary Purpose: Palliative Region: Posterolateral Lumbosacral Spine Level: L2, L3, L4, L5, & S1 Medial Branch Level(s). Injecting these levels blocks the L3-4, L4-5, and L5-S1 lumbar facet joints. Laterality: Bilateral  Type: Moderate (Conscious) Sedation combined with Local Anesthesia Indication(s): Analgesia and Anxiety Route: Intravenous (IV) IV Access: Secured Sedation: Meaningful verbal contact was maintained at all times during the procedure  Local Anesthetic: Lidocaine 1-2%  Position: Prone   Indications: 1. Lumbar facet syndrome (Bilateral) (R>L)   2. Spondylosis without myelopathy or radiculopathy, lumbosacral region   3. DDD (degenerative disc disease), lumbar   4. Chronic low back pain (Bilateral) (R>L)    Pain Score: Pre-procedure: 7 /10 Post-procedure: 0-No pain/10   Pre-op Assessment:  Mary Cruz is a 44 y.o. (year old), female patient, seen today for interventional treatment. She  has a past  surgical history that includes spinal cord stimulator; Occipital nerve stimulator insertion; Cholecystectomy (2000); Knee surgery; Shoulder surgery; Tubal ligation (2000); Endometrial ablation; Spinal cord stimulator implant (2008); Knee surgery (Right, 02-2015); Breast surgery; Spinal cord stimulator insertion (N/A, 06/21/2016); Shoulder arthroscopy with distal clavicle resection (Right, 10/29/2018); and Subacromial decompression (Right, 10/29/2018). Mary Cruz has a current medication list which includes the following prescription(s): albuterol, amitriptyline, clonazepam, duloxetine, lisinopril-hydrochlorothiazide, meloxicam, methocarbamol, omeprazole, oxycodone, [START ON 03/24/2019] oxycodone, [START ON 04/23/2019] oxycodone, and methylprednisolone, and the following Facility-Administered Medications: midazolam. Her primarily concern today is the Back Pain  Initial Vital Signs:  Pulse/HCG Rate: 98ECG Heart Rate: 82 Temp: 98.1 F (36.7 C) Resp: 13 BP: 137/72 SpO2: 99 %  BMI: Estimated body mass index is 39.78 kg/m as calculated from the following:   Height as of this encounter: 5\' 7"  (1.702 m).   Weight as of this encounter: 254 lb (115.2 kg).  Risk Assessment: Allergies: Reviewed. She is allergic to keflex [cephalexin]; amoxicillin; celebrex [celecoxib]; pentazocine lactate; bactrim [sulfamethoxazole-trimethoprim]; doxycycline; erythromycin; hydromorphone hcl; latex; and nabumetone.  Allergy Precautions: None required Coagulopathies: Reviewed. None identified.  Blood-thinner therapy: None at this time Active Infection(s): Reviewed. None identified. Mary Cruz is afebrile  Site Confirmation: Mary Cruz was asked to confirm the procedure and laterality before marking the site Procedure checklist: Completed Consent: Before the procedure and under the influence of no sedative(s), amnesic(s), or anxiolytics, the patient was informed of the treatment options, risks and possible complications. To  fulfill our ethical and legal obligations, as recommended by the American Medical Association's Code of Ethics, I have informed the patient of my clinical impression; the nature and purpose of the treatment or procedure; the risks, benefits, and possible complications of the intervention; the alternatives, including doing  nothing; the risk(s) and benefit(s) of the alternative treatment(s) or procedure(s); and the risk(s) and benefit(s) of doing nothing. The patient was provided information about the general risks and possible complications associated with the procedure. These may include, but are not limited to: failure to achieve desired goals, infection, bleeding, organ or nerve damage, allergic reactions, paralysis, and death. In addition, the patient was informed of those risks and complications associated to Spine-related procedures, such as failure to decrease pain; infection (i.e.: Meningitis, epidural or intraspinal abscess); bleeding (i.e.: epidural hematoma, subarachnoid hemorrhage, or any other type of intraspinal or peri-dural bleeding); organ or nerve damage (i.e.: Any type of peripheral nerve, nerve root, or spinal cord injury) with subsequent damage to sensory, motor, and/or autonomic systems, resulting in permanent pain, numbness, and/or weakness of one or several areas of the body; allergic reactions; (i.e.: anaphylactic reaction); and/or death. Furthermore, the patient was informed of those risks and complications associated with the medications. These include, but are not limited to: allergic reactions (i.e.: anaphylactic or anaphylactoid reaction(s)); adrenal axis suppression; blood sugar elevation that in diabetics may result in ketoacidosis or comma; water retention that in patients with history of congestive heart failure may result in shortness of breath, pulmonary edema, and decompensation with resultant heart failure; weight gain; swelling or edema; medication-induced neural toxicity;  particulate matter embolism and blood vessel occlusion with resultant organ, and/or nervous system infarction; and/or aseptic necrosis of one or more joints. Finally, the patient was informed that Medicine is not an exact science; therefore, there is also the possibility of unforeseen or unpredictable risks and/or possible complications that may result in a catastrophic outcome. The patient indicated having understood very clearly. We have given the patient no guarantees and we have made no promises. Enough time was given to the patient to ask questions, all of which were answered to the patient's satisfaction. Ms. Pitsenbarger has indicated that she wanted to continue with the procedure. Attestation: I, the ordering provider, attest that I have discussed with the patient the benefits, risks, side-effects, alternatives, likelihood of achieving goals, and potential problems during recovery for the procedure that I have provided informed consent. Date  Time: 02/25/2019  9:00 AM  Pre-Procedure Preparation:  Monitoring: As per clinic protocol. Respiration, ETCO2, SpO2, BP, heart rate and rhythm monitor placed and checked for adequate function Safety Precautions: Patient was assessed for positional comfort and pressure points before starting the procedure. Time-out: I initiated and conducted the "Time-out" before starting the procedure, as per protocol. The patient was asked to participate by confirming the accuracy of the "Time Out" information. Verification of the correct person, site, and procedure were performed and confirmed by me, the nursing staff, and the patient. "Time-out" conducted as per Joint Commission's Universal Protocol (UP.01.01.01). Time: 0951  Description of Procedure:          Laterality: Bilateral. The procedure was performed in identical fashion on both sides. Levels:  L2, L3, L4, L5, & S1 Medial Branch Level(s) Area Prepped: Posterior Lumbosacral Region Prepping solution: DuraPrep  (Iodine Povacrylex [0.7% available iodine] and Isopropyl Alcohol, 74% w/w) Safety Precautions: Aspiration looking for blood return was conducted prior to all injections. At no point did we inject any substances, as a needle was being advanced. Before injecting, the patient was told to immediately notify me if she was experiencing any new onset of "ringing in the ears, or metallic taste in the mouth". No attempts were made at seeking any paresthesias. Safe injection practices and needle disposal techniques used.  Medications properly checked for expiration dates. SDV (single dose vial) medications used. After the completion of the procedure, all disposable equipment used was discarded in the proper designated medical waste containers. Local Anesthesia: Protocol guidelines were followed. The patient was positioned over the fluoroscopy table. The area was prepped in the usual manner. The time-out was completed. The target area was identified using fluoroscopy. A 12-in long, straight, sterile hemostat was used with fluoroscopic guidance to locate the targets for each level blocked. Once located, the skin was marked with an approved surgical skin marker. Once all sites were marked, the skin (epidermis, dermis, and hypodermis), as well as deeper tissues (fat, connective tissue and muscle) were infiltrated with a small amount of a short-acting local anesthetic, loaded on a 10cc syringe with a 25G, 1.5-in  Needle. An appropriate amount of time was allowed for local anesthetics to take effect before proceeding to the next step. Local Anesthetic: Lidocaine 2.0% The unused portion of the local anesthetic was discarded in the proper designated containers. Technical explanation of process:  L2 Medial Branch Nerve Block (MBB): The target area for the L2 medial branch is at the junction of the postero-lateral aspect of the superior articular process and the superior, posterior, and medial edge of the transverse process of L3.  Under fluoroscopic guidance, a Quincke needle was inserted until contact was made with os over the superior postero-lateral aspect of the pedicular shadow (target area). After negative aspiration for blood, 0.5 mL of the nerve block solution was injected without difficulty or complication. The needle was removed intact. L3 Medial Branch Nerve Block (MBB): The target area for the L3 medial branch is at the junction of the postero-lateral aspect of the superior articular process and the superior, posterior, and medial edge of the transverse process of L4. Under fluoroscopic guidance, a Quincke needle was inserted until contact was made with os over the superior postero-lateral aspect of the pedicular shadow (target area). After negative aspiration for blood, 0.5 mL of the nerve block solution was injected without difficulty or complication. The needle was removed intact. L4 Medial Branch Nerve Block (MBB): The target area for the L4 medial branch is at the junction of the postero-lateral aspect of the superior articular process and the superior, posterior, and medial edge of the transverse process of L5. Under fluoroscopic guidance, a Quincke needle was inserted until contact was made with os over the superior postero-lateral aspect of the pedicular shadow (target area). After negative aspiration for blood, 0.5 mL of the nerve block solution was injected without difficulty or complication. The needle was removed intact. L5 Medial Branch Nerve Block (MBB): The target area for the L5 medial branch is at the junction of the postero-lateral aspect of the superior articular process and the superior, posterior, and medial edge of the sacral ala. Under fluoroscopic guidance, a Quincke needle was inserted until contact was made with os over the superior postero-lateral aspect of the pedicular shadow (target area). After negative aspiration for blood, 0.5 mL of the nerve block solution was injected without difficulty or  complication. The needle was removed intact. S1 Medial Branch Nerve Block (MBB): The target area for the S1 medial branch is at the posterior and inferior 6 o'clock position of the L5-S1 facet joint. Under fluoroscopic guidance, the Quincke needle inserted for the L5 MBB was redirected until contact was made with os over the inferior and postero aspect of the sacrum, at the 6 o' clock position under the L5-S1 facet joint (Target  area). After negative aspiration for blood, 0.5 mL of the nerve block solution was injected without difficulty or complication. The needle was removed intact.  Nerve block solution: 0.2% PF-Ropivacaine + Triamcinolone (40 mg/mL) diluted to a final concentration of 4 mg of Triamcinolone/mL of Ropivacaine The unused portion of the solution was discarded in the proper designated containers. Procedural Needles: 22-gauge, 3.5-inch, Quincke needles used for all levels.  Once the entire procedure was completed, the treated area was cleaned, making sure to leave some of the prepping solution back to take advantage of its long term bactericidal properties.   Illustration of the posterior view of the lumbar spine and the posterior neural structures. Laminae of L2 through S1 are labeled. DPRL5, dorsal primary ramus of L5; DPRS1, dorsal primary ramus of S1; DPR3, dorsal primary ramus of L3; FJ, facet (zygapophyseal) joint L3-L4; I, inferior articular process of L4; LB1, lateral branch of dorsal primary ramus of L1; IAB, inferior articular branches from L3 medial branch (supplies L4-L5 facet joint); IBP, intermediate branch plexus; MB3, medial branch of dorsal primary ramus of L3; NR3, third lumbar nerve root; S, superior articular process of L5; SAB, superior articular branches from L4 (supplies L4-5 facet joint also); TP3, transverse process of L3.  Vitals:   02/25/19 1009 02/25/19 1015 02/25/19 1026 02/25/19 1037  BP: (!) 141/80 126/82 126/77 121/71  Pulse: 82     Resp: 13 12 13 14     Temp:    98 F (36.7 C)  SpO2: 96% 99% 100% 100%  Weight:      Height:         Start Time: 0951 hrs. End Time:   hrs.  Imaging Guidance (Spinal):          Type of Imaging Technique: Fluoroscopy Guidance (Spinal) Indication(s): Assistance in needle guidance and placement for procedures requiring needle placement in or near specific anatomical locations not easily accessible without such assistance. Exposure Time: Please see nurses notes. Contrast: None used. Fluoroscopic Guidance: I was personally present during the use of fluoroscopy. "Tunnel Vision Technique" used to obtain the best possible view of the target area. Parallax error corrected before commencing the procedure. "Direction-depth-direction" technique used to introduce the needle under continuous pulsed fluoroscopy. Once target was reached, antero-posterior, oblique, and lateral fluoroscopic projection used confirm needle placement in all planes. Images permanently stored in EMR. Interpretation: No contrast injected. I personally interpreted the imaging intraoperatively. Adequate needle placement confirmed in multiple planes. Permanent images saved into the patient's record.  Antibiotic Prophylaxis:   Anti-infectives (From admission, onward)   None     Indication(s): None identified  Post-operative Assessment:  Post-procedure Vital Signs:  Pulse/HCG Rate: 8285 Temp: 98 F (36.7 C) Resp: 14 BP: 121/71 SpO2: 100 %  EBL: None  Complications: No immediate post-treatment complications observed by team, or reported by patient.  Note: The patient tolerated the entire procedure well. A repeat set of vitals were taken after the procedure and the patient was kept under observation following institutional policy, for this type of procedure. Post-procedural neurological assessment was performed, showing return to baseline, prior to discharge. The patient was provided with post-procedure discharge instructions, including a  section on how to identify potential problems. Should any problems arise concerning this procedure, the patient was given instructions to immediately contact us, at any time, without hesitation. In any case, we plan to contact the patient by telephone for a follow-up status report regarding this interventional procedure.  Comments:  No additional relevant information.  Plan  of Care  Orders:  Orders Placed This Encounter  Procedures  . L-FCT Blk (Today)    Scheduling Instructions:     Procedure: Lumbar facet block (AKA.: Lumbosacral medial branch nerve block)     Side: Bilateral     Level: L3-4, L4-5, & L5-S1 Facets (L2, L3, L4, L5, & S1 Medial Branch Nerves)     Sedation: Patient's choice.     Timeframe: Today    Order Specific Question:   Where will this procedure be performed?    Answer:   [facility]  . Fluoro (C-Arm) (<60 min) (No Report)    Intraoperative interpretation by procedural physician at [place].    Standing Status:   Standing    Number of Occurrences:   1    Order Specific Question:   Reason for exam:    Answer:   Assistance in needle guidance and placement for procedures requiring needle placement in or near specific anatomical locations not easily accessible without such assistance.  . Consent: L-FCT BLK    Nursing Order: Transcribe to consent form and obtain patient signature. Note: Always confirm laterality of pain with Ms. Vacchiano, before procedure. Procedure: Lumbar Facet Block  under fluoroscopic guidance Indication/Reason: Low Back Pain, with our without leg pain, due to Facet Joint Arthralgia (Joint Pain) known as Lumbar Facet Syndrome, secondary to Lumbar, and/or Lumbosacral Spondylosis (Arthritis of the Spine), without myelopathy or radiculopathy (Nerve Damage). Provider Attestation: I, Yetter Dossie Arbour, MD, (Pain Management Specialist), the physician/practitioner, attest that I have discussed with the patient the benefits, risks, side  effects, alternatives, likelihood of achieving goals and potential problems during recovery for the procedure that I have provided informed consent.  . Block Tray    Equipment required: Single use, disposable, "Block Tray"    Standing Status:   Standing    Number of Occurrences:   1    Order Specific Question:   Specify    Answer:   Block Tray   Chronic Opioid Analgesic:  Oxycodone IR 5 mg, 1 tab PO q 6 hrs (20 mg/day of oxycodone) MME/day:30 mg/day.   Medications ordered for procedure: Meds ordered this encounter  Medications  . lidocaine (XYLOCAINE) 2 % (with pres) injection 400 mg  . lactated ringers infusion 1,000 mL  . midazolam (VERSED) 5 MG/5ML injection 1-2 mg    Make sure Flumazenil is available in the pyxis when using this medication. If oversedation occurs, administer 0.2 mg IV over 15 sec. If after 45 sec no response, administer 0.2 mg again over 1 min; may repeat at 1 min intervals; not to exceed 4 doses (1 mg)  . fentaNYL (SUBLIMAZE) injection 25-50 mcg    Make sure Narcan is available in the pyxis when using this medication. In the event of respiratory depression (RR< 8/min): Titrate NARCAN (naloxone) in increments of 0.1 to 0.2 mg IV at 2-3 minute intervals, until desired degree of reversal.  . ropivacaine (PF) 2 mg/mL (0.2%) (NAROPIN) injection 18 mL  . triamcinolone acetonide (KENALOG-40) injection 80 mg   Medications administered: We administered lidocaine, lactated ringers, midazolam, fentaNYL, ropivacaine (PF) 2 mg/mL (0.2%), and triamcinolone acetonide.  See the medical record for exact dosing, route, and time of administration.  Follow-up plan:   Return in about 2 weeks (around 03/11/2019) for (VV), (PP).       Interventional treatment options:  Under consideration:   Diagnostic right suprascapular nerve block #1    Therapeutic/palliative (PRN):   Palliative bilateral lumbar facet blocks  Palliative right lumbar facet RFA #2 (last done on 12/01/2017) (by  Dr. Holley Raring)  Palliative left lumbar facet RFA #2 (last done on 09/22/2017) (by Dr. Holley Raring)  Palliative left C7-T1 CESI  PRN SCS programming adjustments (2018 replacement by Dr. Clydell Hakim)    Recent Visits Date Type Provider Dept  02/17/19 Telemedicine Milinda Pointer, MD [facility]  Showing recent visits within past 90 days and meeting all other requirements   Today's Visits Date Type Provider Dept  02/25/19 Procedure visit Milinda Pointer, MD [facility]  Showing today's visits and meeting all other requirements   Future Appointments Date Type Provider Dept  03/10/19 Appointment Milinda Pointer, [facility]  05/19/19 Appointment Milinda Pointer, MD [facility]  Showing future appointments within next 90 days and meeting all other requirements   Disposition: Discharge home  Discharge (Date  Time): 02/25/2019; 1038 hrs.   Primary Care Physician: Emeterio Reeve, DO Location: [facility] Note by: Gaspar Cola, MD Date: 02/25/2019; Time: 11:19 AM  Disclaimer:  Medicine is not an Chief Strategy Officer. The only guarantee in medicine is that nothing is guaranteed. It is important to note that the decision to proceed with this intervention was based on the information collected from the patient. The Data and conclusions were drawn from the patient's questionnaire, the interview, and the physical examination. Because the information was provided in large part by the patient, it cannot be guaranteed that it has not been purposely or unconsciously manipulated. Every effort has been made to obtain as much relevant data as possible for this evaluation. It is important to note that the conclusions that lead to this procedure are derived in large part from the available data. Always take into account that the treatment will also be dependent on availability of resources and existing treatment guidelines,  considered by other Pain Management Practitioners as being common knowledge and practice, at the time of the intervention. For Medico-Legal purposes, it is also important to point out that variation in procedural techniques and pharmacological choices are the acceptable norm. The indications, contraindications, technique, and results of the above procedure should only be interpreted and judged by a Board-Certified Interventional Pain Specialist with extensive familiarity and expertise in the same exact procedure and technique.

## 2019-02-25 NOTE — Patient Instructions (Signed)

## 2019-02-26 ENCOUNTER — Telehealth: Payer: Self-pay

## 2019-02-26 NOTE — Telephone Encounter (Signed)
Post procedure phone call.  Patient states she is doing well.  

## 2019-03-08 ENCOUNTER — Encounter: Payer: Self-pay | Admitting: Pain Medicine

## 2019-03-08 NOTE — Progress Notes (Signed)
Pain relief after procedure (treated area only): (Questions asked to patient) 1. Starting about 15 minutes after the procedure, and "while the area was still numb" (from the local anesthetics), were you having any of your usual pain "in that area" (the treated area)?  (NOTE: NOT including the discomfort from the needle sticks.) First 1 hour: 100% better. First 4-6 hours: 100 % better. .  3. How much better is your pain now, when compared to before the procedure? Current benefit:75 % better. 4. Can you move better now? Improvement in ROM (Range of Motion): Yes. 5. Can you do more now? Improvement in function: Yes. 4. Did you have any problems with the procedure? Side-effects/Complications: No. 

## 2019-03-09 NOTE — Progress Notes (Signed)
Patient: Mary Cruz  Service Category: E/M  Provider: Gaspar Cola, MD  DOB: 1975-07-02  DOS: 03/10/2019  Location: Office  MRN: [id number]  Setting: Ambulatory outpatient  Referring Provider: Emeterio Reeve, DO  Type: Established Patient  Specialty: Interventional Pain Management  PCP: Emeterio Reeve, DO  Location: Remote location  Delivery: TeleHealth     Virtual Encounter - Pain Management PROVIDER NOTE: Information contained herein reflects review and annotations entered in association with encounter. Interpretation of such information and data should be left to medically-trained personnel. Information provided to patient can be located elsewhere in the medical record under "Patient Instructions". Document created using STT-dictation technology, any transcriptional errors that may result from process are unintentional.    Contact & Pharmacy Preferred: [phone] Home: [phone] (home) Mobile: [phone] (mobile) E-mail: [email or web address]  [id number] - [place], [place] - [address] [place] [address] Phone: [phone] Fax: [phone]   Pre-screening  Ms. Attebury offered "in-person" vs "virtual" encounter. She indicated preferring virtual for this encounter.   Reason COVID-19*  Social distancing based on CDC and AMA recommendations.   I contacted ADANA PEDRO on 03/10/2019 via telephone.      I clearly identified myself as Gaspar Cola, MD. I verified that I was speaking with the correct person using two identifiers (Name: CAROLEANN GERTZ, and date of birth: Jun 16, 1975).  Consent I sought verbal advanced consent from Emelia Loron for virtual visit interactions. I informed Ms. Caballes of possible security and privacy concerns, risks, and limitations associated with providing "not-in-person" medical evaluation and management services. I also informed Ms. Dollens of the availability of "in-person"  appointments. Finally, I informed her that there would be a charge for the virtual visit and that she could be  personally, fully or partially, financially responsible for it. Ms. Bosarge expressed understanding and agreed to proceed.   Historic Elements   Ms. MOMO RICHART is a 44 y.o. year old, female patient evaluated today after her last encounter by our practice on 02/26/2019. Ms. Schepps  has a past medical history of Abscess of axillary fold (10/10/2013), Acute bronchitis with asthma with acute exacerbation (01/23/2015), Fibromyalgia, Hypertension, Lupus ([place]), Osteoarthritis, Pain, and Sepsis affecting skin (10/10/2013). She also  has a past surgical history that includes spinal cord stimulator; Occipital nerve stimulator insertion; Cholecystectomy (2000); Knee surgery; Shoulder surgery; Tubal ligation (2000); Endometrial ablation; Spinal cord stimulator implant (2008); Knee surgery (Right, 02-2015); Breast surgery; Spinal cord stimulator insertion (N/A, 06/21/2016); Shoulder arthroscopy with distal clavicle resection (Right, 10/29/2018); and Subacromial decompression (Right, 10/29/2018). Ms. Weitman has a current medication list which includes the following prescription(s): albuterol, amitriptyline, clonazepam, duloxetine, lisinopril-hydrochlorothiazide, meloxicam, methocarbamol, methylprednisolone, omeprazole, [START ON 03/24/2019] oxycodone, [START ON 04/23/2019] oxycodone, and [START ON 05/23/2019] oxycodone. She  reports that she has quit smoking. Her smoking use included cigarettes. She quit smokeless tobacco use about 14 months ago. She reports that she does not drink alcohol or use drugs. Ms. Reifsnyder is allergic to keflex [cephalexin]; amoxicillin; celebrex [celecoxib]; pentazocine lactate; bactrim [sulfamethoxazole-trimethoprim]; doxycycline; erythromycin; hydromorphone hcl; latex; and nabumetone.   HPI  Today, she is being contacted for a post-procedure assessment.  The patient refers again having  attained excellent relief of the pain with the bilateral lumbar facet block.  Unfortunately, last night the pain started coming back on both sides with the right side being worse.  In reviewing her case, I see that the last time that she had a radiofrequency ablation of the  lumbar facets was on 12/01/2017 on the right side and 09/22/2017 on the left side, both done by Dr. Holley Raring.  Clearly he has been more than a year since both of her radiofrequencies and it is logical that they have worn off.  This was confirmed by the 02/25/2019 bilateral lumbar facet block.  Today I have talked to the patient about this and she agrees that it probably needs to be repeated.  She has requested to do the right side first, followed by the left.  At this point I do believe that it is medically necessary for her to undergo this second RFA.  Post-Procedure Evaluation  Procedure (02/25/2019): Palliative bilateral lumbar facet block under fluoroscopic guidance and IV sedation Pre-procedure pain level:  7/10 Post-procedure: 0/10 (100% relief)  Sedation: Sedation provided.  Dewayne Shorter, RN  03/08/2019  3:24 PM  Sign when Signing Visit Pain relief after procedure (treated area only): (Questions asked to patient) 1. Starting about 15 minutes after the procedure, and "while the area was still numb" (from the local anesthetics), were you having any of your usual pain "in that area" (the treated area)?  (NOTE: NOT including the discomfort from the needle sticks.) First 1 hour: 100 % better. First 4-6 hours: 100 % better. 3. How much better is your pain now, when compared to before the procedure? Current benefit: 75 % better. 4. Can you move better now? Improvement in ROM (Range of Motion): Yes. 5. Can you do more now? Improvement in function: Yes. 4. Did you have any problems with the procedure? Side-effects/Complications: No.  Current benefits: Defined as benefit that persist at this time.   Analgesia:  >75%  relief Function: Ms. Saldierna reports improvement in function ROM: Ms. Junghans reports improvement in ROM  Medical Necessity: Ms. 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 Over-the-counter oral analgesic medications (i.e.: ibuprofen, naproxen, etc.) 2. Anti-inflammatory medications 3. Muscle relaxants 4. Membrane stabilizers 5. Opioids 6. Physical therapy (PT), chiropractic manipulation, and/or home exercise program (HEP). 7. Modalities (Heat, ice, etc.) 8. Invasive techniques such as nerve blocks.  Ms. Matsuura has attained greater than 50% reduction in pain from at least two (2) diagnostic medial branch blocks conducted in separate occasions. For this reason, I believe it is medically necessary to proceed with Non-Pulsed Radiofrequency Ablation for the purpose of attempting to prolong the duration of the benefits seen with the diagnostic injections.  Pharmacotherapy Assessment  Analgesic: Oxycodone IR 5 mg, 1 tab PO q 6 hrs (20 mg/day of oxycodone) MME/day:30 mg/day.   Monitoring: Pharmacotherapy: No side-effects or adverse reactions reported.  PMP: PDMP reviewed during this encounter.       Compliance: No problems identified. Effectiveness: Clinically acceptable. Plan: Refer to "POC".  UDS:  Summary  Date Value Ref Range Status  02/13/2018 FINAL  Final    Comment:    ==================================================================== TOXASSURE SELECT 13 (MW) ==================================================================== Test                             Result       Flag       Units Drug Present and  Declared for Prescription Verification  Oxycodone                      2035         EXPECTED   ng/mg creat   Oxymorphone                    564          EXPECTED   ng/mg creat   Noroxycodone                   1930         EXPECTED   ng/mg creat   Noroxymorphone                 142          EXPECTED   ng/mg creat    Sources of oxycodone are scheduled prescription medications.    Oxymorphone, noroxycodone, and noroxymorphone are expected    metabolites of oxycodone. Oxymorphone is also available as a    scheduled prescription medication. Drug Absent but Declared for Prescription Verification   Clonazepam                     Not Detected UNEXPECTED ng/mg creat ==================================================================== Test                      Result    Flag   Units      Ref Range   Creatinine              103              mg/dL      >=20 ==================================================================== Declared Medications:  The flagging and interpretation on this report are based on the  following declared medications.  Unexpected results may arise from  inaccuracies in the declared medications.  **Note: The testing scope of this panel includes these medications:  Clonazepam (Klonopin)  Oxycodone (Roxicodone)  **Note: The testing scope of this panel does not include following  reported medications:  Albuterol  Amitriptyline (Elavil)  Cholecalciferol  Diclofenac  Duloxetine (Cymbalta)  Esomeprazole  Methocarbamol (Robaxin)  Naproxen  Omeprazole (Prilosec)  Prednisone  Pregabalin (Lyrica)  Quetiapine (Seroquel) ==================================================================== For clinical consultation, please call [phone]. ====================================================================    Laboratory Chemistry Profile (12 mo)  Renal: 10/26/2018: BUN 10; Creatinine, Ser 1.00  Lab Results  Component Value Date   GFRAA >60 10/26/2018   GFRNONAA >60  10/26/2018   Hepatic: No results found for requested labs within last 8760 hours. Lab Results  Component Value Date   AST 18 10/24/2016   ALT 16 10/24/2016   Other: No results found for requested labs within last 8760 hours.  Note: Above Lab results reviewed.  Imaging  Fluoro (C-Arm) (<60 min) (No Report) Fluoro was used, but no Radiologist interpretation will be provided.  Please refer to "NOTES" tab for provider progress note.   Assessment  The primary encounter diagnosis was Chronic pain syndrome. Diagnoses of Chronic low back pain (Bilateral) (R>L), Lumbar facet syndrome (Bilateral) (R>L), and Chronic hip pain (Third area of Pain) (Bilateral) (R>L) were also pertinent to this visit.  Plan of Care  Problem-specific:  No problem-specific Assessment & Plan notes found for this encounter.  I am having Emelia Loron maintain her albuterol, omeprazole, clonazePAM, meloxicam, methylPREDNISolone, DULoxetine, lisinopril-hydrochlorothiazide, methocarbamol, oxyCODONE, oxyCODONE, amitriptyline, and oxyCODONE.  Pharmacotherapy (Medications Ordered): Meds ordered this encounter  Medications  . oxyCODONE (OXY IR/ROXICODONE) 5 MG immediate  release tablet    Sig: Take 1 tablet (5 mg total) by mouth every 6 (six) hours as needed for severe pain. Must last 30 days    Dispense:  120 tablet    Refill:  0    Chronic Pain: STOP Act (Not applicable) Fill 1 day early if closed on refill date. Do not fill until: 05/23/2019. To last until: 06/22/2019. Avoid benzodiazepines within 8 hours of opioids   Orders:  Orders Placed This Encounter  Procedures  . Radiofrequency,Lumbar    Standing Status:   Future    Standing Expiration Date:   09/06/2020    Scheduling Instructions:     Side(s): Right-sided     Level: L3-4, L4-5, & L5-S1 Facets (L2, L3, L4, L5, & S1 Medial Branch Nerves)     Sedation: With Sedation     Scheduling Timeframe: As soon as pre-approved    Order Specific Question:   Where  will this procedure be performed?    Answer:   [facility]   Follow-up plan:   Return in about 3 months (around 06/21/2019) for (VV), (MM), in addition, RFA (w/ sedation): (R) L-FCT RFA #2, (ASAP).      Interventional treatment options:  Under consideration:   Diagnostic right suprascapular nerve block #1  Repeat therapeutic bilateral lumbar facet RFA    Therapeutic/palliative (PRN):   Palliative bilateral lumbar facet blocks  Palliative right lumbar facet RFA #2 (last done on 12/01/2017) (by Dr. Holley Raring)  Palliative left lumbar facet RFA #2 (last done on 09/22/2017) (by Dr. Holley Raring)  Palliative left C7-T1 CESI  PRN SCS programming adjustments (2018 replacement by Dr. Clydell Hakim)    Recent Visits Date Type Provider Dept  02/25/19 Procedure visit Milinda Pointer, MD [facility]  02/17/19 Telemedicine Milinda Pointer, MD [facility]  Showing recent visits within past 90 days and meeting all other requirements   Today's Visits Date Type Provider Dept  03/10/19 Telemedicine Milinda Pointer, MD [facility]  Showing today's visits and meeting all other requirements   Future Appointments Date Type Provider Dept  05/19/19 Appointment Milinda Pointer, MD [facility]  Showing future appointments within next 90 days and meeting all other requirements   I discussed the assessment and treatment plan with the patient. The patient was provided an opportunity to ask questions and all were answered. The patient agreed with the plan and demonstrated an understanding of the instructions.  Patient advised to call back or seek an in-person evaluation if the symptoms or condition worsens.  Duration of encounter: 15 minutes.  Note by: Gaspar Cola, MD Date: 03/10/2019; Time: 2:33 PM

## 2019-03-10 ENCOUNTER — Other Ambulatory Visit: Payer: Self-pay

## 2019-03-10 ENCOUNTER — Telehealth: Payer: Self-pay

## 2019-03-10 ENCOUNTER — Ambulatory Visit: Payer: BLUE CROSS/BLUE SHIELD | Attending: Pain Medicine | Admitting: Pain Medicine

## 2019-03-10 DIAGNOSIS — M25551 Pain in right hip: Secondary | ICD-10-CM | POA: Diagnosis not present

## 2019-03-10 DIAGNOSIS — M545 Low back pain: Secondary | ICD-10-CM

## 2019-03-10 DIAGNOSIS — M47816 Spondylosis without myelopathy or radiculopathy, lumbar region: Secondary | ICD-10-CM | POA: Diagnosis not present

## 2019-03-10 DIAGNOSIS — G8929 Other chronic pain: Secondary | ICD-10-CM

## 2019-03-10 DIAGNOSIS — G894 Chronic pain syndrome: Secondary | ICD-10-CM | POA: Diagnosis not present

## 2019-03-10 DIAGNOSIS — M25552 Pain in left hip: Secondary | ICD-10-CM

## 2019-03-10 MED ORDER — OXYCODONE HCL 5 MG PO TABS: 5.0000 mg | ORAL_TABLET | Freq: Four times a day (QID) | ORAL | 0 refills | Status: DC | PRN

## 2019-03-10 NOTE — Telephone Encounter (Signed)
I called the patient to discharge her and she wants to know if we can change the side of the RFA that was ordered today. She wants to start with the left side instead of the right.

## 2019-03-10 NOTE — Telephone Encounter (Signed)
I have sent a note to Dr. Dossie Arbour.

## 2019-03-13 ENCOUNTER — Other Ambulatory Visit: Payer: Self-pay | Admitting: Osteopathic Medicine

## 2019-03-13 NOTE — Telephone Encounter (Signed)
Requested medication (s) are due for refill today: yes  Requested medication (s) are on the active medication list: yes  Last refill:  02/16/2019  Future visit scheduled: no  Notes to clinic:  review for refill   Requested Prescriptions  Pending Prescriptions Disp Refills   DULoxetine (CYMBALTA) 60 MG capsule [Pharmacy Med Name: DULOXETINE HCL DR 60 MG CAP] 60 capsule 0    Sig: Take 1 capsule (60 mg total) by mouth 2 (two) times daily. MUST MAKE APPOINTMENT      Psychiatry: Antidepressants - SNRI Failed - 03/13/2019 12:35 AM      Failed - Valid encounter within last 6 months    Recent Outpatient Visits           2 months ago Acute ear pain, bilateral   [facility], Royetta Car, PA-C   7 months ago Essential hypertension, benign   [facility], Lanelle Bal, DO   1 year ago Anxiety and depression   [facility], Lanelle Bal, DO   2 years ago Thyroid function study abnormality   [facility], Lanelle Bal, DO   2 years ago [facility], Rebekah Chesterfield, MD       Future Appointments             In 3 months Milinda Pointer, MD [place] - Completed PHQ-2 or PHQ-9 in the last 360 days.      Passed - Last BP in normal range    BP Readings from Last 1 Encounters:  02/25/19 121/71

## 2019-03-15 NOTE — Telephone Encounter (Signed)
Left message with patient to call me back re; phone message.

## 2019-03-15 NOTE — Telephone Encounter (Signed)
Patient returned call and would like the left side done first. 5/10. Please call patient to make appointment after PA. Thanks

## 2019-03-15 NOTE — Telephone Encounter (Signed)
I need to know before I try to get prior authorization. They will ask what side. Thank you

## 2019-03-31 ENCOUNTER — Telehealth: Payer: Self-pay | Admitting: *Deleted

## 2019-04-01 ENCOUNTER — Ambulatory Visit: Payer: BLUE CROSS/BLUE SHIELD | Admitting: Pain Medicine

## 2019-04-08 ENCOUNTER — Other Ambulatory Visit: Payer: Self-pay

## 2019-04-08 ENCOUNTER — Telehealth: Payer: Self-pay | Admitting: *Deleted

## 2019-04-08 ENCOUNTER — Encounter: Payer: Self-pay | Admitting: Pain Medicine

## 2019-04-08 ENCOUNTER — Ambulatory Visit (HOSPITAL_BASED_OUTPATIENT_CLINIC_OR_DEPARTMENT_OTHER): Payer: BLUE CROSS/BLUE SHIELD | Admitting: Pain Medicine

## 2019-04-08 ENCOUNTER — Ambulatory Visit
Admission: RE | Admit: 2019-04-08 | Discharge: 2019-04-08 | Disposition: A | Discharge status: Home / Self Care | Payer: BLUE CROSS/BLUE SHIELD | Source: Ambulatory Visit | Attending: Pain Medicine | Admitting: Pain Medicine

## 2019-04-08 VITALS — BP 127/78 | HR 106 | Temp 98.1°F | Resp 18 | Ht 64.0 in | Wt 270.0 lb

## 2019-04-08 DIAGNOSIS — M8949 Other hypertrophic osteoarthropathy, multiple sites: Secondary | ICD-10-CM | POA: Diagnosis present

## 2019-04-08 DIAGNOSIS — M545 Low back pain, unspecified: Secondary | ICD-10-CM

## 2019-04-08 DIAGNOSIS — G8929 Other chronic pain: Secondary | ICD-10-CM

## 2019-04-08 DIAGNOSIS — G8918 Other acute postprocedural pain: Secondary | ICD-10-CM | POA: Insufficient documentation

## 2019-04-08 DIAGNOSIS — M159 Polyosteoarthritis, unspecified: Secondary | ICD-10-CM

## 2019-04-08 DIAGNOSIS — M25552 Pain in left hip: Secondary | ICD-10-CM | POA: Insufficient documentation

## 2019-04-08 DIAGNOSIS — M47817 Spondylosis without myelopathy or radiculopathy, lumbosacral region: Secondary | ICD-10-CM | POA: Diagnosis not present

## 2019-04-08 DIAGNOSIS — M47816 Spondylosis without myelopathy or radiculopathy, lumbar region: Secondary | ICD-10-CM | POA: Diagnosis not present

## 2019-04-08 DIAGNOSIS — M5136 Other intervertebral disc degeneration, lumbar region: Secondary | ICD-10-CM | POA: Insufficient documentation

## 2019-04-08 DIAGNOSIS — Z969 Presence of functional implant, unspecified: Secondary | ICD-10-CM

## 2019-04-08 DIAGNOSIS — Z91040 Latex allergy status: Secondary | ICD-10-CM | POA: Insufficient documentation

## 2019-04-08 DIAGNOSIS — M25551 Pain in right hip: Secondary | ICD-10-CM | POA: Insufficient documentation

## 2019-04-08 DIAGNOSIS — M150 Primary generalized (osteo)arthritis: Secondary | ICD-10-CM

## 2019-04-08 HISTORY — DX: Latex allergy status: Z91.040

## 2019-04-08 HISTORY — DX: Primary generalized (osteo)arthritis: M15.0

## 2019-04-08 HISTORY — DX: Polyosteoarthritis, unspecified: M15.9

## 2019-04-08 MED ORDER — HYDROCODONE-ACETAMINOPHEN 5-325 MG PO TABS: 1.0000 | ORAL_TABLET | Freq: Three times a day (TID) | ORAL | 0 refills | Status: AC | PRN

## 2019-04-08 MED ORDER — MIDAZOLAM HCL 5 MG/5ML IJ SOLN
1.0000 mg | INTRAMUSCULAR | Status: DC | PRN
  Administered 2019-04-08: 5 mg via INTRAVENOUS
  Filled 2019-04-08: qty 5

## 2019-04-08 MED ORDER — ROPIVACAINE HCL 2 MG/ML IJ SOLN
9.0000 mL | Freq: Once | INTRAMUSCULAR | Status: AC
  Administered 2019-04-08: 14:00:00 9 mL via PERINEURAL
  Filled 2019-04-08: qty 10

## 2019-04-08 MED ORDER — FENTANYL CITRATE (PF) 100 MCG/2ML IJ SOLN
25.0000 ug | INTRAMUSCULAR | Status: DC | PRN
  Administered 2019-04-08: 100 ug via INTRAVENOUS
  Filled 2019-04-08: qty 2

## 2019-04-08 MED ORDER — LIDOCAINE HCL 2 % IJ SOLN
20.0000 mL | Freq: Once | INTRAMUSCULAR | Status: AC
  Administered 2019-04-08: 14:00:00 400 mg
  Filled 2019-04-08: qty 40

## 2019-04-08 MED ORDER — LACTATED RINGERS IV SOLN
1000.0000 mL | Freq: Once | INTRAVENOUS | Status: AC
  Administered 2019-04-08: 14:00:00 1000 mL via INTRAVENOUS

## 2019-04-08 MED ORDER — TRIAMCINOLONE ACETONIDE 40 MG/ML IJ SUSP
40.0000 mg | Freq: Once | INTRAMUSCULAR | Status: AC
  Administered 2019-04-08: 14:00:00 40 mg
  Filled 2019-04-08: qty 1

## 2019-04-08 NOTE — Progress Notes (Signed)
PROVIDER NOTE: Information contained herein reflects review and annotations entered in association with encounter. Interpretation of such information and data should be left to medically-trained personnel. Information provided to patient can be located elsewhere in the medical record under "Patient Instructions". Document created using STT-dictation technology, any transcriptional errors that may result from process are unintentional.    Patient: Mary Cruz  Service Category: Procedure  Provider: Gaspar Cola, MD  DOB: 1975/09/06  DOS: 04/08/2019  Location: [facility]  MRN: [id number]  Setting: Ambulatory - outpatient  Referring Provider: Milinda Pointer, MD  Type: Established Patient  Specialty: Interventional Pain Management  PCP: Emeterio Reeve, DO   Primary Reason for Visit: Interventional Pain Management Treatment. CC: Back Pain (low bilateral)  Procedure:          Anesthesia, Analgesia, Anxiolysis:  Type: Thermal Lumbar Facet, Medial Branch Radiofrequency Ablation/Neurotomy  #2  Primary Purpose: Therapeutic Region: Posterolateral Lumbosacral Spine Level: L2, L3, L4, L5, & S1 Medial Branch Level(s). These levels will denervate the L3-4, L4-5, and the L5-S1 lumbar facet joints. Laterality: Left  Type: Moderate (Conscious) Sedation combined with Local Anesthesia Indication(s): Analgesia and Anxiety Route: Intravenous (IV) IV Access: Secured Sedation: Meaningful verbal contact was maintained at all times during the procedure  Local Anesthetic: Lidocaine 1-2%  Position: Prone   Indications: 1. Lumbar facet syndrome (Bilateral) (R>L)   2. Spondylosis without myelopathy or radiculopathy, lumbosacral region   3. DDD (degenerative disc disease), lumbar   4. Lumbar spondylosis   5. Chronic low back pain (Bilateral) (R>L)   6. Presence of functional implant ([employer]tronic Lumbar spinal cord stimulator implant)   7. Severe obesity with body mass index (BMI)  of 35.0 to 39.9 with comorbidity (Mary Cruz)   8. Osteoarthritis involving multiple joints   9. Latex precautions, history of latex allergy    Mary Cruz has been dealing with the above chronic pain for longer than three months and has either failed to respond, was unable to tolerate, or simply did not get enough benefit from other more conservative therapies including, but not limited to: 1. Over-the-counter medications 2. Anti-inflammatory medications 3. Muscle relaxants 4. Membrane stabilizers 5. Opioids 6. Physical therapy and/or chiropractic manipulation 7. Modalities (Heat, ice, etc.) 8. Invasive techniques such as nerve blocks. Mary Cruz has attained more than 50% relief of the pain from a series of diagnostic injections conducted in separate occasions.  Pain Score: Pre-procedure: 6 /10 Post-procedure: 0-No pain/10  Pre-op Assessment:  Mary Cruz is a 44 y.o. (year old), female patient, seen today for interventional treatment. She  has a past surgical history that includes spinal cord stimulator; Occipital nerve stimulator insertion; Cholecystectomy (2000); Knee surgery; Shoulder surgery; Tubal ligation (2000); Endometrial ablation; Spinal cord stimulator implant (2008); Knee surgery (Right, 02-2015); Breast surgery; Spinal cord stimulator insertion (N/A, 06/21/2016); Shoulder arthroscopy with distal clavicle resection (Right, 10/29/2018); and Subacromial decompression (Right, 10/29/2018). Mary Cruz has a current medication list which includes the following prescription(s): albuterol, amitriptyline, clonazepam, duloxetine, lisinopril-hydrochlorothiazide, meloxicam, methocarbamol, omeprazole, oxycodone, [START ON 04/23/2019] oxycodone, [START ON 05/23/2019] oxycodone, hydrocodone-acetaminophen, [START ON 04/15/2019] hydrocodone-acetaminophen, and methylprednisolone, and the following Facility-Administered Medications: fentanyl and midazolam. Her primarily concern today is the Back Pain (low  bilateral)  Initial Vital Signs:  Pulse/HCG Rate: (!) 106ECG Heart Rate: 95 Temp: (!) 97.4 F (36.3 C) Resp: 18 BP: 140/87 SpO2: 99 %  BMI: Estimated body mass index is 46.35 kg/m as calculated from the following:   Height as of this encounter: 5\' 4"  (1.626  m).   Weight as of this encounter: 270 lb (122.5 kg).  Risk Assessment: Allergies: Reviewed. She is allergic to keflex [cephalexin]; amoxicillin; celebrex [celecoxib]; pentazocine lactate; bactrim [sulfamethoxazole-trimethoprim]; doxycycline; erythromycin; hydromorphone hcl; latex; and nabumetone.  Allergy Precautions: Latex-free protocol activated Coagulopathies: Reviewed. None identified.  Blood-thinner therapy: None at this time Active Infection(s): Reviewed. None identified. Mary Cruz is afebrile  Site Confirmation: Mary Cruz was asked to confirm the procedure and laterality before marking the site Procedure checklist: Completed Consent: Before the procedure and under the influence of no sedative(s), amnesic(s), or anxiolytics, the patient was informed of the treatment options, risks and possible complications. To fulfill our ethical and legal obligations, as recommended by the American Medical Association's Code of Ethics, I have informed the patient of my clinical impression; the nature and purpose of the treatment or procedure; the risks, benefits, and possible complications of the intervention; the alternatives, including doing nothing; the risk(s) and benefit(s) of the alternative treatment(s) or procedure(s); and the risk(s) and benefit(s) of doing nothing. The patient was provided information about the general risks and possible complications associated with the procedure. These may include, but are not limited to: failure to achieve desired goals, infection, bleeding, organ or nerve damage, allergic reactions, paralysis, and death. In addition, the patient was informed of those risks and complications associated to  Spine-related procedures, such as failure to decrease pain; infection (i.e.: Meningitis, epidural or intraspinal abscess); bleeding (i.e.: epidural hematoma, subarachnoid hemorrhage, or any other type of intraspinal or peri-dural bleeding); organ or nerve damage (i.e.: Any type of peripheral nerve, nerve root, or spinal cord injury) with subsequent damage to sensory, motor, and/or autonomic systems, resulting in permanent pain, numbness, and/or weakness of one or several areas of the body; allergic reactions; (i.e.: anaphylactic reaction); and/or death. Furthermore, the patient was informed of those risks and complications associated with the medications. These include, but are not limited to: allergic reactions (i.e.: anaphylactic or anaphylactoid reaction(s)); adrenal axis suppression; blood sugar elevation that in diabetics may result in ketoacidosis or comma; water retention that in patients with history of congestive heart failure may result in shortness of breath, pulmonary edema, and decompensation with resultant heart failure; weight gain; swelling or edema; medication-induced neural toxicity; particulate matter embolism and blood vessel occlusion with resultant organ, and/or nervous system infarction; and/or aseptic necrosis of one or more joints. Finally, the patient was informed that Medicine is not an exact science; therefore, there is also the possibility of unforeseen or unpredictable risks and/or possible complications that may result in a catastrophic outcome. The patient indicated having understood very clearly. We have given the patient no guarantees and we have made no promises. Enough time was given to the patient to ask questions, all of which were answered to the patient's satisfaction. Ms. Heitzman has indicated that she wanted to continue with the procedure. Attestation: I, the ordering provider, attest that I have discussed with the patient the benefits, risks, side-effects, alternatives,  likelihood of achieving goals, and potential problems during recovery for the procedure that I have provided informed consent. Date  Time: 04/08/2019 12:34 PM  Pre-Procedure Preparation:  Monitoring: As per clinic protocol. Respiration, ETCO2, SpO2, BP, heart rate and rhythm monitor placed and checked for adequate function Safety Precautions: Patient was assessed for positional comfort and pressure points before starting the procedure. Time-out: I initiated and conducted the "Time-out" before starting the procedure, as per protocol. The patient was asked to participate by confirming the accuracy of the "Time Out" information.  Verification of the correct person, site, and procedure were performed and confirmed by me, the nursing staff, and the patient. "Time-out" conducted as per Joint Commission's Universal Protocol (UP.01.01.01). Time: 1329  Description of Procedure:          Laterality: Left Levels:  L2, L3, L4, L5, & S1 Medial Branch Level(s), at the L3-4, L4-5, and the L5-S1 lumbar facet joints. Area Prepped: Lumbosacral Prepping solution: DuraPrep (Iodine Povacrylex [0.7% available iodine] and Isopropyl Alcohol, 74% w/w) Safety Precautions: Aspiration looking for blood return was conducted prior to all injections. At no point did we inject any substances, as a needle was being advanced. Before injecting, the patient was told to immediately notify me if she was experiencing any new onset of "ringing in the ears, or metallic taste in the mouth". No attempts were made at seeking any paresthesias. Safe injection practices and needle disposal techniques used. Medications properly checked for expiration dates. SDV (single dose vial) medications used. After the completion of the procedure, all disposable equipment used was discarded in the proper designated medical waste containers. Local Anesthesia: Protocol guidelines were followed. The patient was positioned over the fluoroscopy table. The area was  prepped in the usual manner. The time-out was completed. The target area was identified using fluoroscopy. A 12-in long, straight, sterile hemostat was used with fluoroscopic guidance to locate the targets for each level blocked. Once located, the skin was marked with an approved surgical skin marker. Once all sites were marked, the skin (epidermis, dermis, and hypodermis), as well as deeper tissues (fat, connective tissue and muscle) were infiltrated with a small amount of a short-acting local anesthetic, loaded on a 10cc syringe with a 25G, 1.5-in  Needle. An appropriate amount of time was allowed for local anesthetics to take effect before proceeding to the next step. Local Anesthetic: Lidocaine 2.0% The unused portion of the local anesthetic was discarded in the proper designated containers. Technical explanation of process:  Radiofrequency Ablation (RFA) L2 Medial Branch Nerve RFA: The target area for the L2 medial branch is at the junction of the postero-lateral aspect of the superior articular process and the superior, posterior, and medial edge of the transverse process of L3. Under fluoroscopic guidance, a Radiofrequency needle was inserted until contact was made with os over the superior postero-lateral aspect of the pedicular shadow (target area). Sensory and motor testing was conducted to properly adjust the position of the needle. Once satisfactory placement of the needle was achieved, the numbing solution was slowly injected after negative aspiration for blood. 2.0 mL of the nerve block solution was injected without difficulty or complication. After waiting for at least 3 minutes, the ablation was performed. Once completed, the needle was removed intact. L3 Medial Branch Nerve RFA: The target area for the L3 medial branch is at the junction of the postero-lateral aspect of the superior articular process and the superior, posterior, and medial edge of the transverse process of L4. Under  fluoroscopic guidance, a Radiofrequency needle was inserted until contact was made with os over the superior postero-lateral aspect of the pedicular shadow (target area). Sensory and motor testing was conducted to properly adjust the position of the needle. Once satisfactory placement of the needle was achieved, the numbing solution was slowly injected after negative aspiration for blood. 2.0 mL of the nerve block solution was injected without difficulty or complication. After waiting for at least 3 minutes, the ablation was performed. Once completed, the needle was removed intact. L4 Medial Branch Nerve RFA:  The target area for the L4 medial branch is at the junction of the postero-lateral aspect of the superior articular process and the superior, posterior, and medial edge of the transverse process of L5. Under fluoroscopic guidance, a Radiofrequency needle was inserted until contact was made with os over the superior postero-lateral aspect of the pedicular shadow (target area). Sensory and motor testing was conducted to properly adjust the position of the needle. Once satisfactory placement of the needle was achieved, the numbing solution was slowly injected after negative aspiration for blood. 2.0 mL of the nerve block solution was injected without difficulty or complication. After waiting for at least 3 minutes, the ablation was performed. Once completed, the needle was removed intact. L5 Medial Branch Nerve RFA: The target area for the L5 medial branch is at the junction of the postero-lateral aspect of the superior articular process of S1 and the superior, posterior, and medial edge of the sacral ala. Under fluoroscopic guidance, a Radiofrequency needle was inserted until contact was made with os over the superior postero-lateral aspect of the pedicular shadow (target area). Sensory and motor testing was conducted to properly adjust the position of the needle. Once satisfactory placement of the needle was  achieved, the numbing solution was slowly injected after negative aspiration for blood. 2.0 mL of the nerve block solution was injected without difficulty or complication. After waiting for at least 3 minutes, the ablation was performed. Once completed, the needle was removed intact. S1 Medial Branch Nerve RFA: The target area for the S1 medial branch is located inferior to the junction of the S1 superior articular process and the L5 inferior articular process, posterior, inferior, and lateral to the 6 o'clock position of the L5-S1 facet joint, just superior to the S1 posterior foramen. Under fluoroscopic guidance, the Radiofrequency needle was advanced until contact was made with os over the Target area. Sensory and motor testing was conducted to properly adjust the position of the needle. Once satisfactory placement of the needle was achieved, the numbing solution was slowly injected after negative aspiration for blood. 2.0 mL of the nerve block solution was injected without difficulty or complication. After waiting for at least 3 minutes, the ablation was performed. Once completed, the needle was removed intact. Radiofrequency lesioning (ablation):  Radiofrequency Generator: NeuroTherm [id number] Sensory Stimulation Parameters: 50 Hz was used to locate & identify the nerve, making sure that the needle was positioned such that there was no sensory stimulation below 0.3 V or above 0.7 V. Motor Stimulation Parameters: 2 Hz was used to evaluate the motor component. Care was taken not to lesion any nerves that demonstrated motor stimulation of the lower extremities at an output of less than 2.5 times that of the sensory threshold, or a maximum of 2.0 V. Lesioning Technique Parameters: Standard Radiofrequency settings. (Not bipolar or pulsed.) Temperature Settings: 80 degrees C Lesioning time: 60 seconds Intra-operative Compliance: Compliant Materials & Medications: Needle(s) (Electrode/Cannula) Type:  Teflon-coated, curved tip, Radiofrequency needle(s) Gauge: 20G Length: 15cm Numbing solution: 0.2% PF-Ropivacaine + Triamcinolone (40 mg/mL) diluted to a final concentration of 4 mg of Triamcinolone/mL of Ropivacaine The unused portion of the solution was discarded in the proper designated containers.  Once the entire procedure was completed, the treated area was cleaned, making sure to leave some of the prepping solution back to take advantage of its long term bactericidal properties.  Illustration of the posterior view of the lumbar spine and the posterior neural structures. Laminae of L2 through S1 are labeled. DPRL5,  dorsal primary ramus of L5; DPRS1, dorsal primary ramus of S1; DPR3, dorsal primary ramus of L3; FJ, facet (zygapophyseal) joint L3-L4; I, inferior articular process of L4; LB1, lateral branch of dorsal primary ramus of L1; IAB, inferior articular branches from L3 medial branch (supplies L4-L5 facet joint); IBP, intermediate branch plexus; MB3, medial branch of dorsal primary ramus of L3; NR3, third lumbar nerve root; S, superior articular process of L5; SAB, superior articular branches from L4 (supplies L4-5 facet joint also); TP3, transverse process of L3.  Vitals:   04/08/19 1408 04/08/19 1418 04/08/19 1428 04/08/19 1437  BP: 134/79 132/80 130/78 127/78  Pulse:      Resp: 16 18 17 18   Temp:  (!) 97.3 F (36.3 C)  98.1 F (36.7 C)  SpO2: 95% 98% 98% 99%  Weight:      Height:       Start Time: 1329 hrs. End Time: 1408 hrs.  Imaging Guidance (Spinal):          Type of Imaging Technique: Fluoroscopy Guidance (Spinal) Indication(s): Assistance in needle guidance and placement for procedures requiring needle placement in or near specific anatomical locations not easily accessible without such assistance. Exposure Time: Please see nurses notes. Contrast: None used. Fluoroscopic Guidance: I was personally present during the use of fluoroscopy. "Tunnel Vision Technique" used  to obtain the best possible view of the target area. Parallax error corrected before commencing the procedure. "Direction-depth-direction" technique used to introduce the needle under continuous pulsed fluoroscopy. Once target was reached, antero-posterior, oblique, and lateral fluoroscopic projection used confirm needle placement in all planes. Images permanently stored in EMR. Interpretation: No contrast injected. I personally interpreted the imaging intraoperatively. Adequate needle placement confirmed in multiple planes. Permanent images saved into the patient's record.  Antibiotic Prophylaxis:   Anti-infectives (From admission, onward)   None     Indication(s): None identified  Post-operative Assessment:  Post-procedure Vital Signs:  Pulse/HCG Rate: (!) 10687 Temp: 98.1 F (36.7 C) Resp: 18 BP: 127/78 SpO2: 99 %  EBL: None  Complications: No immediate post-treatment complications observed by team, or reported by patient.  Note: The patient tolerated the entire procedure well. A repeat set of vitals were taken after the procedure and the patient was kept under observation following institutional policy, for this type of procedure. Post-procedural neurological assessment was performed, showing return to baseline, prior to discharge. The patient was provided with post-procedure discharge instructions, including a section on how to identify potential problems. Should any problems arise concerning this procedure, the patient was given instructions to immediately contact us, at any time, without hesitation. In any case, we plan to contact the patient by telephone for a follow-up status report regarding this interventional procedure.  Comments:  No additional relevant information.  Plan of Care  Orders:  Orders Placed This Encounter  Procedures  . Radiofrequency,Lumbar    Scheduling Instructions:     Side(s): Left-sided     Level: L3-4, L4-5, & L5-S1 Facets (L2, L3, L4, L5, & S1  Medial Branch Nerves)     Sedation: With Sedation     Timeframe: Today    Order Specific Question:   Where will this procedure be performed?    Answer:   [facility]  . Radiofrequency,Lumbar    Standing Status:   Future    Standing Expiration Date:   10/05/2020    Scheduling Instructions:     Side(s): Right-sided     Level: L3-4, L4-5, & L5-S1 Facets (L2, L3, L4,  L5, & S1 Medial Branch Nerves)     Sedation: With Sedation     Scheduling Timeframe: 2 weeks from now    Order Specific Question:   Where will this procedure be performed?    Answer:   [facility]  . DG PAIN [facility]IN[facility] C-ARM 1-60 MIN NO REPORT    Intraoperative interpretation by procedural physician at [place].    Standing Status:   Standing    Number of Occurrences:   1    Order Specific Question:   Reason for exam:    Answer:   Assistance in needle guidance and placement for procedures requiring needle placement in or near specific anatomical locations not easily accessible without such assistance.  . Informed Consent Details: Physician/Practitioner Attestation; Transcribe to consent form and obtain patient signature    Nursing Order: Transcribe to consent form and obtain patient signature. Note: Always confirm laterality of pain with Ms. Kwiecinski, before procedure. Procedure: Lumbar Facet Radiofrequency Ablation Indication/Reason: Low Back Pain, with our without leg pain, due to Facet Joint Arthralgia (Joint Pain) known as Lumbar Facet Syndrome, secondary to Lumbar, and/or Lumbosacral Spondylosis (Arthritis of the Spine), without myelopathy or radiculopathy (Nerve Damage). Provider Attestation: I, [place] Dossie Arbour, MD, (Pain Management Specialist), the physician/practitioner, attest that I have discussed with the patient the benefits, risks, side effects, alternatives, likelihood of achieving goals and potential problems during recovery for the procedure that I have provided informed consent.  .  Provide equipment / supplies at bedside    Equipment required: Sterile "Radiofrequency Tray"; Large hemostat (1); Small hemostat (1); Towels (6-8); 4x4 sterile sponge pack (1) Radiofrequency Needle(s): Size: Long Quantity: 5    Standing Status:   Standing    Number of Occurrences:   1    Order Specific Question:   Specify    Answer:   Radiofrequency Tray  . Latex precautions    Activate Latex-Free Protocol.    Standing Status:   Standing    Number of Occurrences:   1   Chronic Opioid Analgesic:  Oxycodone IR 5 mg, 1 tab PO q 6 hrs (20 mg/day of oxycodone) MME/day:30 mg/day.   Medications ordered for procedure: Meds ordered this encounter  Medications  . lidocaine (XYLOCAINE) 2 % (with pres) injection 400 mg  . lactated ringers infusion 1,000 mL  . midazolam (VERSED) 5 MG/5ML injection 1-2 mg    Make sure Flumazenil is available in the pyxis when using this medication. If oversedation occurs, administer 0.2 mg IV over 15 sec. If after 45 sec no response, administer 0.2 mg again over 1 min; may repeat at 1 min intervals; not to exceed 4 doses (1 mg)  . fentaNYL (SUBLIMAZE) injection 25-50 mcg    Make sure Narcan is available in the pyxis when using this medication. In the event of respiratory depression (RR< 8/min): Titrate NARCAN (naloxone) in increments of 0.1 to 0.2 mg IV at 2-3 minute intervals, until desired degree of reversal.  . ropivacaine (PF) 2 mg/mL (0.2%) (NAROPIN) injection 9 mL  . triamcinolone acetonide (KENALOG-40) injection 40 mg  . HYDROcodone-acetaminophen (NORCO/VICODIN) 5-325 MG tablet    Sig: Take 1 tablet by mouth every 8 (eight) hours as needed for up to 7 days for severe pain. Must last 7 days.    Dispense:  21 tablet    Refill:  0    For acute post-operative pain. Not to be refilled. Must last 7 days.  [name] HYDROcodone-acetaminophen (NORCO/VICODIN) 5-325 MG tablet  Sig: Take 1 tablet by mouth every 8 (eight) hours as needed for up to 7 days for severe pain.  Must last for 7 days.    Dispense:  21 tablet    Refill:  0    For acute post-operative pain. Not to be refilled. Must last 7 days.   Medications administered: We administered lidocaine, lactated ringers, midazolam, fentaNYL, ropivacaine (PF) 2 mg/mL (0.2%), and triamcinolone acetonide.  See the medical record for exact dosing, route, and time of administration.  Follow-up plan:   Return in about 2 weeks (around 04/22/2019) for RFA (w/ sedation): (R) L-FCT RFA #2.       Interventional treatment options:  Under consideration:   Diagnostic right suprascapular nerve block #1  Repeat therapeutic bilateral lumbar facet RFA    Therapeutic/palliative (PRN):   Palliative bilateral lumbar facet blocks  Palliative right lumbar facet RFA #2 (last done on 12/01/2017) (by Dr. Holley Raring)  Palliative left lumbar facet RFA #2 (last done on 09/22/2017) (by Dr. Holley Raring)  Palliative left C7-T1 CESI  PRN SCS programming adjustments (2018 replacement by Dr. Clydell Hakim)     Recent Visits Date Type Provider Dept  03/10/19 Telemedicine Milinda Pointer, [facility]  02/25/19 Procedure visit Milinda Pointer, MD [facility]  02/17/19 Telemedicine Milinda Pointer, MD [facility]  Showing recent visits within past 90 days and meeting all other requirements   Today's Visits Date Type Provider Dept  04/08/19 Procedure visit Milinda Pointer, MD [facility]  Showing today's visits and meeting all other requirements   Future Appointments Date Type Provider Dept  04/22/19 Appointment Milinda Pointer, [facility]  06/21/19 Appointment Milinda Pointer, MD [facility]  Showing future appointments within next 90 days and meeting all other requirements   Disposition: Discharge home  Discharge (Date  Time): 04/08/2019; 1447 hrs.   Primary Care Physician: Emeterio Reeve, DO Location: [facility] Note  by: Gaspar Cola, MD Date: 04/08/2019; Time: 2:51 PM  Disclaimer:  Medicine is not an Chief Strategy Officer. The only guarantee in medicine is that nothing is guaranteed. It is important to note that the decision to proceed with this intervention was based on the information collected from the patient. The Data and conclusions were drawn from the patient's questionnaire, the interview, and the physical examination. Because the information was provided in large part by the patient, it cannot be guaranteed that it has not been purposely or unconsciously manipulated. Every effort has been made to obtain as much relevant data as possible for this evaluation. It is important to note that the conclusions that lead to this procedure are derived in large part from the available data. Always take into account that the treatment will also be dependent on availability of resources and existing treatment guidelines, considered by other Pain Management Practitioners as being common knowledge and practice, at the time of the intervention. For Medico-Legal purposes, it is also important to point out that variation in procedural techniques and pharmacological choices are the acceptable norm. The indications, contraindications, technique, and results of the above procedure should only be interpreted and judged by a Board-Certified Interventional Pain Specialist with extensive familiarity and expertise in the same exact procedure and technique.

## 2019-04-08 NOTE — Progress Notes (Signed)
Safety precautions to be maintained throughout the outpatient stay will include: orient to surroundings, keep bed in low position, maintain call bell within reach at all times, provide assistance with transfer out of bed and ambulation.  

## 2019-04-08 NOTE — Telephone Encounter (Signed)
Called pharmacy. States her insurance will not pay for it. Out of pocket is $24. Patient called and is ok with this.

## 2019-04-08 NOTE — Patient Instructions (Signed)
___________________________________________________________________________________________  Post-Radiofrequency (RF) Discharge Instructions  You have just completed a Radiofrequency Neurotomy.  The following instructions will provide you with information and guidelines for self-care upon discharge.  If at any time you have questions or concerns please call your physician. DO NOT DRIVE YOURSELF!!  Instructions:  Apply ice: Fill a plastic sandwich bag with crushed ice. Cover it with a small towel and apply to injection site. Apply for 15 minutes then remove x 15 minutes. Repeat sequence on day of procedure, until you go to bed. The purpose is to minimize swelling and discomfort after procedure.  Apply heat: Apply heat to procedure site starting the day following the procedure. The purpose is to treat any soreness and discomfort from the procedure.  Food intake: No eating limitations, unless stipulated above.  Nevertheless, if you have had sedation, you may experience some nausea.  In this case, it may be wise to wait at least two hours prior to resuming regular diet.  Physical activities: Keep activities to a minimum for the first 8 hours after the procedure. For the first 24 hours after the procedure, do not drive a motor vehicle,  Operate heavy machinery, power tools, or handle any weapons.  Consider walking with the use of an assistive device or accompanied by an adult for the first 24 hours.  Do not drink alcoholic beverages including beer.  Do not make any important decisions or sign any legal documents. Go home and rest today.  Resume activities tomorrow, as tolerated.  Use caution in moving about as you may experience mild leg weakness.  Use caution in cooking, use of household electrical appliances and climbing steps.  Driving: If you have received any sedation, you are not allowed to drive for 24 hours after your procedure.  Blood thinner: Restart your blood thinner 6 hours after your  procedure. (Only for those taking blood thinners)  Insulin: As soon as you can eat, you may resume your normal dosing schedule. (Only for those taking insulin)  Medications: May resume pre-procedure medications.  Do not take any drugs, other than what has been prescribed to you.  Infection prevention: Keep procedure site clean and dry.  Post-procedure Pain Diary: Extremely important that this be done correctly and accurately. Recorded information will be used to determine the next step in treatment.  Pain evaluated is that of treated area only. Do not include pain from an untreated area.  Complete every hour, on the hour, for the initial 8 hours. Set an alarm to help you do this part accurately.  Do not go to sleep and have it completed later. It will not be accurate.  Follow-up appointment: Keep your follow-up appointment after the procedure. Usually 2 weeks for most procedures. (6 weeks in the case of radiofrequency.) Bring you pain diary.   Expect:  From numbing medicine (AKA: Local Anesthetics): Numbness or decrease in pain.  Onset: Full effect within 15 minutes of injected.  Duration: It will depend on the type of local anesthetic used. On the average, 1 to 8 hours.   From steroids: Decrease in swelling or inflammation. Once inflammation is improved, relief of the pain will follow.  Onset of benefits: Depends on the amount of swelling present. The more swelling, the longer it will take for the benefits to be seen. In some cases, up to 10 days.  Duration: Steroids will stay in the system x 2 weeks. Duration of benefits will depend on multiple posibilities including persistent irritating factors.  From procedure: Some   discomfort is to be expected once the numbing medicine wears off. This should be minimal if ice and heat are applied as instructed.  Call if:  You experience numbness and weakness that gets worse with time, as opposed to wearing off.  He experience any unusual  bleeding, difficulty breathing, or loss of the ability to control your bowel and bladder. (This applies to Spinal procedures only)  You experience any redness, swelling, heat, red streaks, elevated temperature, fever, or any other signs of a possible infection.  Emergency Numbers:  [facility] hours (Monday - Thursday, 8:00 AM - 4:00 PM) (Friday, 9:00 AM - 12:00 Noon): [phone]  After hours: [phone] ____________________________________________________________________________________________    ______________________________________________________________________________________________  Weight Management Required  URGENT: Your weight has been found to be adversely affecting your health.  Dear Mary Cruz:  Your current Estimated body mass index is 39.78 kg/m as calculated from the following:   Height as of 02/25/19: 5\' 7"  (1.702 m).   Weight as of 02/25/19: 254 lb (115.2 kg).  Calculations estimate your ideal body weight to be: Patient weight not recorded  Please use the table below to identify your weight category and associated incidence of chronic pain, secondary to your weight.  Body Mass Index (BMI) Classification BMI level (kg/m2) Category Associated incidence of chronic pain  <18  Underweight   18.5-24.9 Ideal body weight   25-29.9 Overweight  20%  30-34.9 Obese (Class I)  68%  35-39.9 Severe obesity (Class II)  136%  >40 Extreme obesity (Class III)  254%   In addition: You will be considered "Morbidly Obese", if your BMI is above 30 and you have one or more of the following conditions which are known to be directly associated with obesity: 1.    Type 2 Diabetes (Which in turn can lead to cardiovascular diseases (CVD), stroke, peripheral vascular diseases (PVD), retinopathy, nephropathy, and neuropathy) 2.    Cardiovascular Disease (High Blood Pressure; Congestive Heart Failure; High Cholesterol; Coronary Artery Disease; Angina; or History of Heart  Attacks) 3.    Breathing problems (Asthma; obesity-hypoventilation syndrome; obstructive sleep apnea; chronic inflammatory airway disease; reactive airway disease; or shortness of breath) 4.    Chronic kidney disease 5.    Liver disease (nonalcoholic fatty liver disease) 6.    High blood pressure 7.    Acid reflux (gastroesophageal reflux disease; heartburn) 8.    Osteoarthritis (OA) (with any of the following: hip pain; knee pain; and/or low back pain) 9.    Low back pain (Lumbar Facet Syndrome; and/or Degenerative Disc Disease) 10.  Hip pain (Osteoarthritis of hip) (For every 1 lbs of added body weight, there is a 2 lbs increase in pressure inside of each hip articulation. 1:2 mechanical relationship) 11.  Knee pain (Osteoarthritis of knee) (For every 1 lbs of added body weight, there is a 4 lbs increase in pressure inside of each knee articulation. 1:4 mechanical relationship) (patients with a BMI>30 kg/m2 were 6.8 times more likely to develop knee OA than normal-weight individuals) 12.  Cancer. Epidemiological studies have shown that obesity is a risk factor for: post-menopausal breast cancer; cancers of the endometrium, colon and kidney cancer; malignant adenomas of the oesophagus. Obese subjects have an approximately 1.5-3.5-fold increased risk of developing these cancers compared with normal-weight subjects, and it has been estimated that between 15 and 45% of these cancers can be attributed to overweight. More recent studies suggest that obesity may also increase the risk of other types of cancer, including pancreatic,  hepatic and gallbladder cancer. (Ref: Obesity and cancer. Pischon T, Nthlings U, [employer] H. Proc Nutr Soc. 2008 May;67(2):128-45. doi: N3840775.)  Recommendation: At this point it is urgent that you take a step back and concentrate in loosing weight. Dedicate 100% of your efforts on this task. Nothing else will improve your health more than bringing down your BMI  to less than 30. Because most chronic pain patients do have difficulty exercising secondary to their pain, you must rely on proper nutrition and dieting in order to lose the weight. If your BMI is above 40, you should seriously consider bariatric surgery. A realistic goal is to lose 10% of your body weight over a period of 12 months.  If over time you have unsuccessfully try to lose weight, then it is time for you to seek professional help and to enter a medically supervised weight management program.  Pain management considerations:  1.    Pharmacological Problems: Be advised that the use of opioid analgesics (oxycodone; hydrocodone; morphine; methadone; codeine; and all of their derivatives) have been associated with decreased metabolism and weight gain.  For this reason, should we see that you are unable to lose weight while taking these medications, it may become necessary for [place] to taper down and indefinitely discontinue them.  2.    Technical Problems: The incidence of successful interventional therapies decreases as the patient's BMI increases. It is much more difficult to accomplish a safe and effective interventional therapy on a patient with a BMI above 35. 3.    Radiation Exposure Problems: The x-rays machine, used to accomplish injection therapies, will automatically increase their x-ray output in order to capture an appropriate bone image. This means that radiation exposure increases exponentially with the patient's BMI. (The higher the BMI, the higher the radiation exposure.) Although the level of radiation used at a given time is still safe to the patient, it is not for the physician and/or assisting staff. Unfortunately, radiation exposure is accumulative. Because physicians and the staff have to do procedures and be exposed on a daily basis, this can result in health problems such as cancer and radiation burns. Radiation exposure to the staff is monitored by the radiation batches that they  wear. The exposure levels are reported back to the staff on a quarterly basis. Depending on levels of exposure, physicians and staff may be obligated by law to decrease this exposure. This means that they have the right and obligation to refuse providing therapies where they may be overexposed to radiation. For this reason, physicians may decline to offer therapies such as radiofrequency ablation or implants to patients with a BMI above 40. 4.    Current Trends: Be advised that the current trend is to no longer offer certain therapies to patients with a BMI equal to, or above 35, due to increase perioperative risks, increased technical procedural difficulties, and excessive radiation exposure to healthcare personnel.  ______________________________________________________________________________________________

## 2019-04-09 ENCOUNTER — Telehealth: Payer: Self-pay

## 2019-04-09 NOTE — Telephone Encounter (Signed)
Post procedure phone call.  Patient states she is doing well.  

## 2019-04-15 ENCOUNTER — Other Ambulatory Visit: Payer: Self-pay

## 2019-04-15 ENCOUNTER — Ambulatory Visit: Payer: BLUE CROSS/BLUE SHIELD | Admitting: Pain Medicine

## 2019-04-15 ENCOUNTER — Encounter: Payer: Self-pay | Admitting: Nurse Practitioner

## 2019-04-15 ENCOUNTER — Ambulatory Visit (INDEPENDENT_AMBULATORY_CARE_PROVIDER_SITE_OTHER): Payer: BLUE CROSS/BLUE SHIELD | Admitting: Nurse Practitioner

## 2019-04-15 VITALS — BP 125/85 | HR 105 | Temp 98.1°F | Ht 64.0 in | Wt 275.0 lb

## 2019-04-15 DIAGNOSIS — F32A Depression, unspecified: Secondary | ICD-10-CM

## 2019-04-15 DIAGNOSIS — F419 Anxiety disorder, unspecified: Secondary | ICD-10-CM | POA: Diagnosis not present

## 2019-04-15 DIAGNOSIS — Z6841 Body Mass Index (BMI) 40.0 and over, adult: Secondary | ICD-10-CM

## 2019-04-15 DIAGNOSIS — I10 Essential (primary) hypertension: Secondary | ICD-10-CM

## 2019-04-15 DIAGNOSIS — G4700 Insomnia, unspecified: Secondary | ICD-10-CM

## 2019-04-15 DIAGNOSIS — F329 Major depressive disorder, single episode, unspecified: Secondary | ICD-10-CM

## 2019-04-15 DIAGNOSIS — G43009 Migraine without aura, not intractable, without status migrainosus: Secondary | ICD-10-CM

## 2019-04-15 MED ORDER — BUPROPION HCL ER (SR) 150 MG PO TB12: 150.0000 mg | ORAL_TABLET | Freq: Two times a day (BID) | ORAL | 1 refills | Status: DC

## 2019-04-15 MED ORDER — LISINOPRIL-HYDROCHLOROTHIAZIDE 10-12.5 MG PO TABS: 1.0000 | ORAL_TABLET | Freq: Every day | ORAL | 12 refills | Status: DC

## 2019-04-15 MED ORDER — RIZATRIPTAN BENZOATE 10 MG PO TABS: 10.0000 mg | ORAL_TABLET | ORAL | 12 refills | Status: DC | PRN

## 2019-04-15 MED ORDER — PHENTERMINE HCL 37.5 MG PO CAPS: 37.5000 mg | ORAL_CAPSULE | ORAL | 0 refills | Status: DC

## 2019-04-15 MED ORDER — DULOXETINE HCL 60 MG PO CPEP: 60.0000 mg | ORAL_CAPSULE | Freq: Two times a day (BID) | ORAL | 12 refills | Status: DC

## 2019-04-15 MED ORDER — CLONAZEPAM 0.5 MG PO TABS: 0.2500 mg | ORAL_TABLET | Freq: Two times a day (BID) | ORAL | 0 refills | Status: DC | PRN

## 2019-04-15 MED ORDER — BUPROPION HCL ER (SR) 150 MG PO TB12: ORAL_TABLET | ORAL | 1 refills | Status: DC

## 2019-04-15 NOTE — Patient Instructions (Addendum)
Persistent Depressive Disorder  Persistent depressive disorder (PDD) is a mental health condition. PDD causes symptoms of low-level depression for 2 years or longer. It may also be called long-term (chronic) depression or dysthymia. PDD may include episodes of more severe depression that last for about 2 weeks (major depressive disorder or MDD). PDD can affect the way you think, feel, and sleep. This condition may also affect your relationships. You may be more likely to get sick if you have PDD. Symptoms of PDD occur for most of the day and may include:  Feeling tired (fatigue).  Low energy.  Eating too much or too little.  Sleeping too much or too little.  Feeling restless or agitated.  Feeling hopeless.  Feeling worthless or guilty.  Feeling worried or nervous (anxiety).  Trouble concentrating or making decisions.  Low self-esteem.  A negative way of looking at things (outlook).  Not being able to have fun or feel pleasure.  Avoiding interacting with people.  Getting angry or annoyed easily (irritability).  Acting aggressive or angry. Follow these instructions at home: Activity  Go back to your normal activities as told by your doctor.  Exercise regularly as told by your doctor. General instructions  Take over-the-counter and prescription medicines only as told by your doctor.  Do not drink alcohol. Or, limit how much alcohol you drink to no more than 1 drink a day for nonpregnant women and 2 drinks a day for men. One drink equals 12 oz of beer, 5 oz of wine, or 1 oz of hard liquor. Alcohol can affect any antidepressant medicines you are taking. Talk with your doctor about your alcohol use.  Eat a healthy diet and get plenty of sleep.  Find activities that you enjoy each day.  Consider joining a support group. Your doctor may be able to suggest a support group.  Keep all follow-up visits as told by your doctor. This is important. Where to find more  information [employer] on Mental Illness  www.nami.org [place] National Institute of Mental Health  https://carter.com/ National Suicide Prevention Lifeline  5741109127).  This is free, 24-hour help. Contact a doctor if:  Your symptoms get worse.  You have new symptoms.  You have trouble sleeping or doing your daily activities. Get help right away if:  You self-harm.  You have serious thoughts about hurting yourself or others.  You see, hear, taste, smell, or feel things that are not there (hallucinate). This information is not intended to replace advice given to you by your health care provider. Make sure you discuss any questions you have with your health care provider. Document Revised: 01/10/2017 Document Reviewed: 09/22/2015 Elsevier Patient Education  [place].   Generalized Anxiety Disorder, Adult Generalized anxiety disorder (GAD) is a mental health disorder. People with this condition constantly worry about everyday events. Unlike normal anxiety, worry related to GAD is not triggered by a specific event. These worries also do not fade or get better with time. GAD interferes with life functions, including relationships, work, and school. GAD can vary from mild to severe. People with severe GAD can have intense waves of anxiety with physical symptoms (panic attacks). What are the causes? The exact cause of GAD is not known. What increases the risk? This condition is more likely to develop in:  Women.  People who have a family history of anxiety disorders.  People who are very shy.  People who experience very stressful life events, such as the death of a loved one.  People who have a very stressful family environment. What are the signs or symptoms? People with GAD often worry excessively about many things in their lives, such as their health and family. They may also be overly concerned about:  Doing well at work.  Being on time.  Natural  disasters.  Friendships. Physical symptoms of GAD include:  Fatigue.  Muscle tension or having muscle twitches.  Trembling or feeling shaky.  Being easily startled.  Feeling like your heart is pounding or racing.  Feeling out of breath or like you cannot take a deep breath.  Having trouble falling asleep or staying asleep.  Sweating.  Nausea, diarrhea, or irritable bowel syndrome (IBS).  Headaches.  Trouble concentrating or remembering facts.  Restlessness.  Irritability. How is this diagnosed? Your health care provider can diagnose GAD based on your symptoms and medical history. You will also have a physical exam. The health care provider will ask specific questions about your symptoms, including how severe they are, when they started, and if they come and go. Your health care provider may ask you about your use of alcohol or drugs, including prescription medicines. Your health care provider may refer you to a mental health specialist for further evaluation. Your health care provider will do a thorough examination and may perform additional tests to rule out other possible causes of your symptoms. To be diagnosed with GAD, a person must have anxiety that:  Is out of his or her control.  Affects several different aspects of his or her life, such as work and relationships.  Causes distress that makes him or her unable to take part in normal activities.  Includes at least three physical symptoms of GAD, such as restlessness, fatigue, trouble concentrating, irritability, muscle tension, or sleep problems. Before your health care provider can confirm a diagnosis of GAD, these symptoms must be present more days than they are not, and they must last for six months or longer. How is this treated? The following therapies are usually used to treat GAD:  Medicine. Antidepressant medicine is usually prescribed for long-term daily control. Antianxiety medicines may be added in  severe cases, especially when panic attacks occur.  Talk therapy (psychotherapy). Certain types of talk therapy can be helpful in treating GAD by providing support, education, and guidance. Options include: ? Cognitive behavioral therapy (CBT). People learn coping skills and techniques to ease their anxiety. They learn to identify unrealistic or negative thoughts and behaviors and to replace them with positive ones. ? Acceptance and commitment therapy (ACT). This treatment teaches people how to be mindful as a way to cope with unwanted thoughts and feelings. ? Biofeedback. This process trains you to manage your body's response (physiological response) through breathing techniques and relaxation methods. You will work with a therapist while machines are used to monitor your physical symptoms.  Stress management techniques. These include yoga, meditation, and exercise. A mental health specialist can help determine which treatment is best for you. Some people see improvement with one type of therapy. However, other people require a combination of therapies. Follow these instructions at home:  Take over-the-counter and prescription medicines only as told by your health care provider.  Try to maintain a normal routine.  Try to anticipate stressful situations and allow extra time to manage them.  Practice any stress management or self-calming techniques as taught by your health care provider.  Do not punish yourself for setbacks or for not making progress.  Try to recognize your accomplishments, even if  they are small.  Keep all follow-up visits as told by your health care provider. This is important. Contact a health care provider if:  Your symptoms do not get better.  Your symptoms get worse.  You have signs of depression, such as: ? A persistently sad, cranky, or irritable mood. ? Loss of enjoyment in activities that used to bring you joy. ? Change in weight or eating. ? Changes in  sleeping habits. ? Avoiding friends or family members. ? Loss of energy for normal tasks. ? Feelings of guilt or worthlessness. Get help right away if:  You have serious thoughts about hurting yourself or others. If you ever feel like you may hurt yourself or others, or have thoughts about taking your own life, get help right away. You can go to your nearest emergency department or call:  Your local emergency services (911 in the [place]  A suicide crisis helpline, such as the [facility] at 405-650-5724. This is open 24 hours a day. Summary  Generalized anxiety disorder (GAD) is a mental health disorder that involves worry that is not triggered by a specific event.  People with GAD often worry excessively about many things in their lives, such as their health and family.  GAD may cause physical symptoms such as restlessness, trouble concentrating, sleep problems, frequent sweating, nausea, diarrhea, headaches, and trembling or muscle twitching.  A mental health specialist can help determine which treatment is best for you. Some people see improvement with one type of therapy. However, other people require a combination of therapies. This information is not intended to replace advice given to you by your health care provider. Make sure you discuss any questions you have with your health care provider. Document Revised: 01/10/2017 Document Reviewed: 12/19/2015 Elsevier Patient Education  [place].   Phentermine tablets or capsules What is this medicine? PHENTERMINE (FEN ter meen) decreases your appetite. It is used with a reduced calorie diet and exercise to help you lose weight. This medicine may be used for other purposes; ask your health care provider or pharmacist if you have questions. COMMON BRAND NAME(S): Adipex-P, Atti-Plex P, Atti-Plex P Spansule, Fastin, Lomaira, Pro-Fast, Tara-8 What should I tell my health care provider before I take this  medicine? They need to know if you have any of these conditions:  agitation or nervousness  diabetes  glaucoma  heart disease  high blood pressure  history of drug abuse or addiction  history of stroke  kidney disease  lung disease called Primary Pulmonary Hypertension (PPH)  taken an MAOI like Carbex, Eldepryl, Marplan, Nardil, or Parnate in last 14 days  taking stimulant medicines for attention disorders, weight loss, or to stay awake  thyroid disease  an unusual or allergic reaction to phentermine, other medicines, foods, dyes, or preservatives  pregnant or trying to get pregnant  breast-feeding How should I use this medicine? Take this medicine by mouth with a glass of water. Follow the directions on the prescription label. Take your medicine at regular intervals. Do not take it more often than directed. Do not stop taking except on your doctor's advice. Talk to your pediatrician regarding the use of this medicine in children. While this drug may be prescribed for children 17 years or older for selected conditions, precautions do apply. Overdosage: If you think you have taken too much of this medicine contact a poison control center or emergency room at once. NOTE: This medicine is only for you. Do not share this medicine  with others. What if I miss a dose? If you miss a dose, take it as soon as you can. If it is almost time for your next dose, take only that dose. Do not take double or extra doses. What may interact with this medicine? Do not take this medicine with any of the following medications:  MAOIs like Carbex, Eldepryl, Marplan, Nardil, and Parnate This medicine may also interact with the following medications:  alcohol  certain medicines for depression, anxiety, or psychotic disorders  certain medicines for high blood pressure  linezolid  medicines for colds or breathing difficulties like pseudoephedrine or phenylephrine  medicines for  diabetes  sibutramine  stimulant medicines for attention disorders, weight loss, or to stay awake This list may not describe all possible interactions. Give your health care provider a list of all the medicines, herbs, non-prescription drugs, or dietary supplements you use. Also tell them if you smoke, drink alcohol, or use illegal drugs. Some items may interact with your medicine. What should I watch for while using this medicine? Visit your doctor or health care provider for regular checks on your progress. Do not stop taking except on your health care provider's advice. You may develop a severe reaction. Your health care provider will tell you how much medicine to take. Do not take this medicine close to bedtime. It may prevent you from sleeping. You may get drowsy or dizzy. Do not drive, use machinery, or do anything that needs mental alertness until you know how this medicine affects you. Do not stand or sit up quickly, especially if you are an older patient. This reduces the risk of dizzy or fainting spells. Alcohol may increase dizziness and drowsiness. Avoid alcoholic drinks. This medicine may affect blood sugar levels. Ask your healthcare provider if changes in diet or medicines are needed if you have diabetes. Women should inform their health care provider if they wish to become pregnant or think they might be pregnant. Losing weight while pregnant is not advised and may cause harm to the unborn child. Talk to your health care provider for more information. What side effects may I notice from receiving this medicine? Side effects that you should report to your doctor or health care professional as soon as possible:  allergic reactions like skin rash, itching or hives, swelling of the face, lips, or tongue  breathing problems  changes in emotions or moods  changes in vision  chest pain or chest tightness  fast, irregular heartbeat  feeling faint or lightheaded  increased blood  pressure  irritable  restlessness  tremors  seizures  signs and symptoms of a stroke like changes in vision; confusion; trouble speaking or understanding; severe headaches; sudden numbness or weakness of the face, arm or leg; trouble walking; dizziness; loss of balance or coordination  unusually weak or tired Side effects that usually do not require medical attention (report to your doctor or health care professional if they continue or are bothersome):  changes in taste  constipation or diarrhea  dizziness  dry mouth  headache  trouble sleeping  upset stomach This list may not describe all possible side effects. Call your doctor for medical advice about side effects. You may report side effects to FDA at 1-800-FDA-1088. Where should I keep my medicine? Keep out of the reach of children. This medicine can be abused. Keep your medicine in a safe place to protect it from theft. Do not share this medicine with anyone. Selling or giving away this medicine is  dangerous and against the law. This medicine may cause harm and death if it is taken by other adults, children, or pets. Return medicine that has not been used to an official disposal site. Contact the DEA at 252-563-5768 or your city/county government to find a site. If you cannot return the medicine, mix any unused medicine with a substance like cat litter or coffee grounds. Then throw the medicine away in a sealed container like a sealed bag or coffee can with a lid. Do not use the medicine after the expiration date. Store at room temperature between 20 and 25 degrees C (68 and 77 degrees F). Keep container tightly closed. NOTE: This sheet is a summary. It may not cover all possible information. If you have questions about this medicine, talk to your doctor, pharmacist, or health care provider.  2020 Elsevier/Gold Standard (2018-12-04 12:54:20)   Calorie Counting for Weight Loss Calories are units of energy. Your body  needs a certain amount of calories from food to keep you going throughout the day. When you eat more calories than your body needs, your body stores the extra calories as fat. When you eat fewer calories than your body needs, your body burns fat to get the energy it needs. Calorie counting means keeping track of how many calories you eat and drink each day. Calorie counting can be helpful if you need to lose weight. If you make sure to eat fewer calories than your body needs, you should lose weight. Ask your health care provider what a healthy weight is for you. For calorie counting to work, you will need to eat the right number of calories in a day in order to lose a healthy amount of weight per week. A dietitian can help you determine how many calories you need in a day and will give you suggestions on how to reach your calorie goal.  A healthy amount of weight to lose per week is usually 1-2 lb (0.5-0.9 kg). This usually means that your daily calorie intake should be reduced by 500-750 calories.  Eating 1,200 - 1,500 calories per day can help most women lose weight.  Eating 1,500 - 1,800 calories per day can help most men lose weight. What is my plan? My goal is to have __________ calories per day. If I have this many calories per day, I should lose around __________ pounds per week. What do I need to know about calorie counting? In order to meet your daily calorie goal, you will need to:  Find out how many calories are in each food you would like to eat. Try to do this before you eat.  Decide how much of the food you plan to eat.  Write down what you ate and how many calories it had. Doing this is called keeping a food log. To successfully lose weight, it is important to balance calorie counting with a healthy lifestyle that includes regular activity. Aim for 150 minutes of moderate exercise (such as walking) or 75 minutes of vigorous exercise (such as running) each week. Where do I find  calorie information?  The number of calories in a food can be found on a Nutrition Facts label. If a food does not have a Nutrition Facts label, try to look up the calories online or ask your dietitian for help. Remember that calories are listed per serving. If you choose to have more than one serving of a food, you will have to multiply the calories per serving by the  amount of servings you plan to eat. For example, the label on a package of bread might say that a serving size is 1 slice and that there are 90 calories in a serving. If you eat 1 slice, you will have eaten 90 calories. If you eat 2 slices, you will have eaten 180 calories. How do I keep a food log? Immediately after each meal, record the following information in your food log:  What you ate. Don't forget to include toppings, sauces, and other extras on the food.  How much you ate. This can be measured in cups, ounces, or number of items.  How many calories each food and drink had.  The total number of calories in the meal. Keep your food log near you, such as in a small notebook in your pocket, or use a mobile app or website. Some programs will calculate calories for you and show you how many calories you have left for the day to meet your goal. What are some calorie counting tips?   Use your calories on foods and drinks that will fill you up and not leave you hungry: ? Some examples of foods that fill you up are nuts and nut butters, vegetables, lean proteins, and high-fiber foods like whole grains. High-fiber foods are foods with more than 5 g fiber per serving. ? Drinks such as sodas, specialty coffee drinks, alcohol, and juices have a lot of calories, yet do not fill you up.  Eat nutritious foods and avoid empty calories. Empty calories are calories you get from foods or beverages that do not have many vitamins or protein, such as candy, sweets, and soda. It is better to have a nutritious high-calorie food (such as an  avocado) than a food with few nutrients (such as a bag of chips).  Know how many calories are in the foods you eat most often. This will help you calculate calorie counts faster.  Pay attention to calories in drinks. Low-calorie drinks include water and unsweetened drinks.  Pay attention to nutrition labels for "low fat" or "fat free" foods. These foods sometimes have the same amount of calories or more calories than the full fat versions. They also often have added sugar, starch, or salt, to make up for flavor that was removed with the fat.  Find a way of tracking calories that works for you. Get creative. Try different apps or programs if writing down calories does not work for you. What are some portion control tips?  Know how many calories are in a serving. This will help you know how many servings of a certain food you can have.  Use a measuring cup to measure serving sizes. You could also try weighing out portions on a kitchen scale. With time, you will be able to estimate serving sizes for some foods.  Take some time to put servings of different foods on your favorite plates, bowls, and cups so you know what a serving looks like.  Try not to eat straight from a bag or box. Doing this can lead to overeating. Put the amount you would like to eat in a cup or on a plate to make sure you are eating the right portion.  Use smaller plates, glasses, and bowls to prevent overeating.  Try not to multitask (for example, watch TV or use your computer) while eating. If it is time to eat, sit down at a table and enjoy your food. This will help you to know when you are  full. It will also help you to be aware of what you are eating and how much you are eating. What are tips for following this plan? Reading food labels  Check the calorie count compared to the serving size. The serving size may be smaller than what you are used to eating.  Check the source of the calories. Make sure the food you are  eating is high in vitamins and protein and low in saturated and trans fats. Shopping  Read nutrition labels while you shop. This will help you make healthy decisions before you decide to purchase your food.  Make a grocery list and stick to it. Cooking  Try to cook your favorite foods in a healthier way. For example, try baking instead of frying.  Use low-fat dairy products. Meal planning  Use more fruits and vegetables. Half of your plate should be fruits and vegetables.  Include lean proteins like poultry and fish. How do I count calories when eating out?  Ask for smaller portion sizes.  Consider sharing an entree and sides instead of getting your own entree.  If you get your own entree, eat only half. Ask for a box at the beginning of your meal and put the rest of your entree in it so you are not tempted to eat it.  If calories are listed on the menu, choose the lower calorie options.  Choose dishes that include vegetables, fruits, whole grains, low-fat dairy products, and lean protein.  Choose items that are boiled, broiled, grilled, or steamed. Stay away from items that are buttered, battered, fried, or served with cream sauce. Items labeled "crispy" are usually fried, unless stated otherwise.  Choose water, low-fat milk, unsweetened iced tea, or other drinks without added sugar. If you want an alcoholic beverage, choose a lower calorie option such as a glass of wine or light beer.  Ask for dressings, sauces, and syrups on the side. These are usually high in calories, so you should limit the amount you eat.  If you want a salad, choose a garden salad and ask for grilled meats. Avoid extra toppings like bacon, cheese, or fried items. Ask for the dressing on the side, or ask for olive oil and vinegar or lemon to use as dressing.  Estimate how many servings of a food you are given. For example, a serving of cooked rice is  cup or about the size of half a baseball. Knowing  serving sizes will help you be aware of how much food you are eating at restaurants. The list below tells you how big or small some common portion sizes are based on everyday objects: ? 1 oz--4 stacked dice. ? 3 oz--1 deck of cards. ? 1 tsp--1 die. ? 1 Tbsp-- a ping-pong ball. ? 2 Tbsp--1 ping-pong ball. ?  cup-- baseball. ? 1 cup--1 baseball. Summary  Calorie counting means keeping track of how many calories you eat and drink each day. If you eat fewer calories than your body needs, you should lose weight.  A healthy amount of weight to lose per week is usually 1-2 lb (0.5-0.9 kg). This usually means reducing your daily calorie intake by 500-750 calories.  The number of calories in a food can be found on a Nutrition Facts label. If a food does not have a Nutrition Facts label, try to look up the calories online or ask your dietitian for help.  Use your calories on foods and drinks that will fill you up, and not on foods  and drinks that will leave you hungry.  Use smaller plates, glasses, and bowls to prevent overeating. This information is not intended to replace advice given to you by your health care provider. Make sure you discuss any questions you have with your health care provider. Document Revised: 10/17/2017 Document Reviewed: 12/29/2015 Elsevier Patient Education  [place].

## 2019-04-15 NOTE — Progress Notes (Addendum)
Established Patient Office Visit  Subjective:  Patient ID: Mary Cruz, female    DOB: 08/04/1975  Age: 44 y.o. MRN: [id number]  CC: Refills. Anxiety/Depression. BP. Weight  HPI Mary Cruz presents for follow-up visit for refills for her chronic medications for hypertension and depression/anxiety.  She would also like to discuss weight loss options and migraine headaches.  ANXIETY/STRESS Patient reports increase in stress, anxiety, and depression specifically over the last year.  She reports her husband has had his hours cut significantly due to Covid and they are experiencing financial stress.  She also reports she is not sleeping well and does not feel the amitriptyline is helping at this point. She is taking klonopin nearly every day at this point. She does feel this is helping her significantly.  Duration:uncontrolled Anxious mood: yes  Excessive worrying: yes Irritability: yes  Sweating: no Nausea: no Palpitations:no Hyperventilation: no Panic attacks: occassionally Obscessions/compulsions: no Depressed mood: yes Weight changes: yes Insomnia: yes hard to fall asleep  Hypersomnia: no Fatigue/loss of energy: yes Feelings of worthlessness: no Feelings of guilt: no Impaired concentration/indecisiveness: yes Suicidal ideations: no  Crying spells: no Recent Stressors/Life Changes: yes   Relationship problems: no   Family stress: yes     Financial stress: yes    Job stress: yes    Recent death/loss: no  Depression screen [facility] 2/9 04/15/2019 04/08/2019 08/04/2018 11/13/2017 11/03/2017  Decreased Interest 2 0 0 0 0  Down, Depressed, Hopeless 2 0 2 0 0  PHQ - 2 Score 4 0 2 0 0  Altered sleeping 3 - 0 - -  Tired, decreased energy 3 - 1 - -  Change in appetite 3 - 1 - -  Feeling bad or failure about yourself  1 - 0 - -  Trouble concentrating 3 - 1 - -  Moving slowly or fidgety/restless 2 - 1 - -  Suicidal thoughts 0 - 0 - -  PHQ-9 Score 19 - 6 - -  Difficult doing  work/chores Very difficult - Not difficult at all - -  Some recent data might be hidden   GAD 7 : Generalized Anxiety Score 04/15/2019 08/04/2018 09/15/2017  Nervous, Anxious, on Edge 2 2 2   Control/stop worrying 2 1 2   Worry too much - different things 2 1 2   Trouble relaxing 3 1 2   Restless 2 1 2   Easily annoyed or irritable 3 3 2   Afraid - awful might happen 2 3 2   Total GAD 7 Score 16 12 14   Anxiety Difficulty Very difficult Not difficult at all Somewhat difficult   HYPERTENSION She reports an increase in her blood pressures at home.  She does attributed this to increased stress in her life right now and added weight. Hypertension status: uncontrolled  Satisfied with current treatment? yes Duration of hypertension: chronic BP monitoring frequency:  daily BP range: 150-160/90's BP medication side effects:  no Medication compliance: excellent compliance Recurrent headaches: yes Visual changes: no Palpitations: no Dyspnea: no Chest pain: no Lower extremity edema: no Dizzy/lightheaded: with headaches  MIGRAINES She reports these are ongoing.  She has taken Maxalt and Imitrex in the past with success.  She feels these are related to the increased stress in her life. Duration: chronic Onset: gradual Severity: range mild to severe Quality: dull, aching, sore and throbbing Frequency: a few times a week Location: Left frontal Headache duration: varies- mostly hours Radiation: no Time of day headache occurs: some at night and some upon awakening Alleviating factors: excedrine  sometimes Aggravating factors: stress Headache status at time of visit: asymptomatic Treatments attempted: Treatments attempted:excedrine, tylenol, ibuprofen, rest Aura: no Nausea:  yes Vomiting: yes Photophobia:  yes Phonophobia:  yes Effect on social functioning:  yes Confusion:  no Gait disturbance/ataxia:  no Behavioral changes:  no Fevers:  no  WEIGHT GAIN Reports an approximate 20 pound  weight gain in the past year. She attributes this to the added stress in her life. She feels that she is unable to get the weight off no matter what she tries. She is frustrated with her weight gain and would like to discuss medication options today.  Duration: past year Previous attempts at weight loss: no Complications of obesity: pain Peak weight: 275 lb Weight loss goal: less than 250 lbs Weight loss to date: 0 Requesting obesity pharmacotherapy: yes Current weight loss supplements/medications: no Previous weight loss supplements/meds: no   Past Medical History:  Diagnosis Date  . Abscess of axillary fold 10/10/2013  . Acute bronchitis with asthma with acute exacerbation 01/23/2015  . Fibromyalgia   . Hypertension   . Lupus ([place])   . Osteoarthritis   . Pain    chronic regional pain syndrome  . Sepsis affecting skin 10/10/2013    Past Surgical History:  Procedure Laterality Date  . BREAST SURGERY    . CHOLECYSTECTOMY  2000  . ENDOMETRIAL ABLATION    . KNEE SURGERY    . KNEE SURGERY Right 02-2015  . OCCIPITAL NERVE STIMULATOR INSERTION    . SHOULDER ARTHROSCOPY WITH DISTAL CLAVICLE RESECTION Right 10/29/2018   Procedure: SHOULDER ARTHROSCOPY WITH DISTAL CLAVICLE RESECTION;  Surgeon: Hiram Gash, MD;  Location: [place];  Service: Orthopedics;  Laterality: Right;  . SHOULDER SURGERY    . spinal cord stimulator    . SPINAL CORD STIMULATOR IMPLANT  2008  . SPINAL CORD STIMULATOR INSERTION N/A 06/21/2016   Procedure: LUMBAR SPINAL CORD STIMULATOR INSERTION;  Surgeon: Clydell Hakim, MD;  Location: [place];  Service: Neurosurgery;  Laterality: N/A;  LUMBAR SPINAL CORD STIMULATOR INSERTION  . SUBACROMIAL DECOMPRESSION Right 10/29/2018   Procedure: SUBACROMIAL DECOMPRESSION;  Surgeon: Hiram Gash, MD;  Location: [place];  Service: Orthopedics;  Laterality: Right;  . TUBAL LIGATION  2000    Family History  Problem Relation Age of Onset  .  Depression Mother        maternal grandmother  . Asthma Mother   . Fibromyalgia Other        aunt  . Stroke Other        grandmother  . Thyroid disease Other        grandmother    Social History   Socioeconomic History  . Marital status: Married    Spouse name: Not on file  . Number of children: Not on file  . Years of education: Not on file  . Highest education level: Not on file  Occupational History  . Not on file  Tobacco Use  . Smoking status: Former Smoker    Types: Cigarettes  . Smokeless tobacco: Former Systems developer    Quit date: 12/22/2017  Substance and Sexual Activity  . Alcohol use: No    Alcohol/week: 0.0 standard drinks  . Drug use: No  . Sexual activity: Yes    Partners: Male    Birth control/protection: Surgical  Other Topics Concern  . Not on file  Social History Narrative  . Not on file   Social Determinants of Health   Financial Resource Strain:   .  Difficulty of Paying Living Expenses: Not on file  Food Insecurity:   . Worried About Charity fundraiser in the Last Year: Not on file  . Ran Out of Food in the Last Year: Not on file  Transportation Needs:   . Lack of Transportation (Medical): Not on file  . Lack of Transportation (Non-Medical): Not on file  Physical Activity:   . Days of Exercise per Week: Not on file  . Minutes of Exercise per Session: Not on file  Stress:   . Feeling of Stress : Not on file  Social Connections:   . Frequency of Communication with Friends and Family: Not on file  . Frequency of Social Gatherings with Friends and Family: Not on file  . Attends Religious Services: Not on file  . Active Member of Clubs or Organizations: Not on file  . Attends Archivist Meetings: Not on file  . Marital Status: Not on file  Intimate Partner Violence:   . Fear of Current or Ex-Partner: Not on file  . Emotionally Abused: Not on file  . Physically Abused: Not on file  . Sexually Abused: Not on file    Outpatient  Medications Prior to Visit  Medication Sig Dispense Refill  . albuterol (PROVENTIL HFA;VENTOLIN HFA) 108 (90 Base) MCG/ACT inhaler Inhale 1-2 puffs into the lungs every 6 (six) hours as needed for wheezing or shortness of breath. 1 Inhaler 0  . HYDROcodone-acetaminophen (NORCO/VICODIN) 5-325 MG tablet Take 1 tablet by mouth every 8 (eight) hours as needed for up to 7 days for severe pain. Must last 7 days. 21 tablet 0  . HYDROcodone-acetaminophen (NORCO/VICODIN) 5-325 MG tablet Take 1 tablet by mouth every 8 (eight) hours as needed for up to 7 days for severe pain. Must last for 7 days. 21 tablet 0  . methocarbamol (ROBAXIN) 750 MG tablet Take 1 tablet (750 mg total) by mouth every 8 (eight) hours as needed for muscle spasms. 90 tablet 5  . omeprazole (PRILOSEC) 40 MG capsule Take 1 capsule (40 mg total) by mouth daily. (Patient taking differently: Take 700 mg by mouth daily. ) 90 capsule 3  . oxyCODONE (OXY IR/ROXICODONE) 5 MG immediate release tablet Take 1 tablet (5 mg total) by mouth every 6 (six) hours as needed for severe pain. Must last 30 days 120 tablet 0  . [START ON 04/23/2019] oxyCODONE (OXY IR/ROXICODONE) 5 MG immediate release tablet Take 1 tablet (5 mg total) by mouth every 6 (six) hours as needed for severe pain. Must last 30 days 120 tablet 0  . [START ON 05/23/2019] oxyCODONE (OXY IR/ROXICODONE) 5 MG immediate release tablet Take 1 tablet (5 mg total) by mouth every 6 (six) hours as needed for severe pain. Must last 30 days 120 tablet 0  . amitriptyline (ELAVIL) 100 MG tablet Take 1 tablet (100 mg total) by mouth at bedtime. 30 tablet 5  . clonazePAM (KLONOPIN) 0.5 MG tablet Take 0.5-1 tablets (0.25-0.5 mg total) by mouth 2 (two) times daily as needed for anxiety. 30 tablet 0  . DULoxetine (CYMBALTA) 60 MG capsule TAKE 1 CAPSULE (60 MG TOTAL) BY MOUTH 2 (TWO) TIMES DAILY. MUST MAKE APPOINTMENT 30 capsule 0  . lisinopril-hydrochlorothiazide (ZESTORETIC) 10-12.5 MG tablet TAKE 1/2 TABLET  BY MOUTH EVERY DAY 15 tablet 5  . meloxicam (MOBIC) 7.5 MG tablet Take 1 tablet (7.5 mg total) by mouth daily. (Patient not taking: Reported on 04/15/2019) 30 tablet 2  . methylPREDNISolone (MEDROL DOSEPAK) 4 MG TBPK tablet Take  as directed by package insert. (Patient not taking: Reported on 04/15/2019) 21 tablet 0   No facility-administered medications prior to visit.    Allergies  Allergen Reactions  . Keflex [Cephalexin] Other (See Comments)    Nausea and causes uti's  . Amoxicillin Diarrhea and Nausea And Vomiting  . Celebrex [Celecoxib] Swelling    "Body swelling"  . Pentazocine Lactate Itching and Other (See Comments)    HALLUCINATIONS  . Bactrim [Sulfamethoxazole-Trimethoprim] Other (See Comments)    "Makes very sick"  . Doxycycline     UNSPECIFIED REACTION  "SICKNESS"  . Erythromycin Other (See Comments)    RESULTANT UTI  . Hydromorphone Hcl Other (See Comments)    HALLUCINATIONS  . Latex Itching  . Nabumetone Itching    ROS Review of Systems  Constitutional: Positive for fatigue and unexpected weight change. Negative for chills.  Eyes: Negative for visual disturbance.  Respiratory: Negative for chest tightness and shortness of breath.   Cardiovascular: Positive for palpitations. Negative for chest pain and leg swelling.       Palpitations only with panic states very infrequently.   Gastrointestinal: Negative for abdominal distention, abdominal pain and constipation.  Endocrine: Negative for cold intolerance and heat intolerance.  Musculoskeletal: Positive for arthralgias and myalgias.  Neurological: Positive for dizziness and headaches. Negative for tremors, syncope, weakness and light-headedness.       Dizziness with migraines  Psychiatric/Behavioral: Positive for agitation, decreased concentration and sleep disturbance. Negative for self-injury and suicidal ideas. The patient is nervous/anxious.       Objective:    Physical Exam  Constitutional: She is oriented  to person, place, and time. She appears well-developed and well-nourished.  HENT:  Head: Normocephalic and atraumatic.  Eyes: Pupils are equal, round, and reactive to light. EOM are normal.  Neck: No JVD present. Carotid bruit is not present. No thyromegaly present.  Cardiovascular: Normal rate, regular rhythm, normal heart sounds and intact distal pulses. PMI is not displaced.  No murmur heard. Pulmonary/Chest: Effort normal and breath sounds normal.  Abdominal: Soft. Bowel sounds are normal.  Musculoskeletal:        General: Normal range of motion.     Cervical back: Normal range of motion and neck supple.  Neurological: She is alert and oriented to person, place, and time. She has normal reflexes.  Skin: Skin is warm and dry.  Psychiatric: She has a normal mood and affect. Her speech is normal and behavior is normal. Judgment and thought content normal. Cognition and memory are normal.  Nursing note and vitals reviewed.   BP 125/85   Pulse (!) 105   Temp 98.1 F (36.7 C) (Oral)   Ht 5\' 4"  (1.626 m)   Wt 275 lb (124.7 kg)   LMP 04/04/2019   SpO2 98%   BMI 47.20 kg/m  Wt Readings from Last 3 Encounters:  04/15/19 275 lb (124.7 kg)  04/08/19 270 lb (122.5 kg)  02/25/19 254 lb (115.2 kg)     Health Maintenance Due  Topic Date Due  . HIV Screening  11/08/1990  . TETANUS/TDAP  11/08/1994  . PAP SMEAR-Modifier  11/07/1996    There are no preventive care reminders to display for this patient.  Lab Results  Component Value Date   TSH 2.63 10/24/2016   Lab Results  Component Value Date   WBC 7.3 10/24/2016   HGB 11.8 10/24/2016   HCT 35.6 10/24/2016   MCV 85.2 10/24/2016   PLT 285 10/24/2016   Lab Results  Component  Value Date   NA 137 10/26/2018   K 4.1 10/26/2018   CO2 25 10/26/2018   GLUCOSE 120 (H) 10/26/2018   BUN 10 10/26/2018   CREATININE 1.00 10/26/2018   BILITOT 0.3 10/24/2016   ALKPHOS 96 07/22/2016   AST 18 10/24/2016   ALT 16 10/24/2016   PROT  7.1 10/24/2016   ALBUMIN 3.8 07/22/2016   CALCIUM 9.1 10/26/2018   ANIONGAP 10 10/26/2018   No results found for: CHOL No results found for: HDL No results found for: LDLCALC No results found for: TRIG No results found for: CHOLHDL No results found for: HGBA1C    Assessment & Plan:   1. Anxiety and depression Patient experiencing significant exacerbation of anxiety and depression most likely related to situational changes with her husband's job and COVID-19.  This is also affecting the patient's sleep.  Patient to limit clonazepam use to only as needed circumstances and avoid taking as a daily medication to avoid dependence and tolerance.  Due to pain contract with pain management this will be the last prescription for a benzodiazepine. Refills provided for duloxetine and clonazepam. Shared decision to trial off of amitriptyline to determine if she notices any differences.  If she begins to experience less sleep or pain she is contact the office.  We will add bupropion SR 150 mg twice daily to see if this helps with depression/anxiety and weight loss.  I feel she would benefit from counseling the current financial circumstances may not allow added co-pays at this time. Patient to follow-up in 4 weeks. - clonazePAM (KLONOPIN) 0.5 MG tablet; Take 0.5-1 tablets (0.25-0.5 mg total) by mouth 2 (two) times daily as needed for anxiety.  Dispense: 30 tablet; Refill: 0 - DULoxetine (CYMBALTA) 60 MG capsule; Take 1 capsule (60 mg total) by mouth 2 (two) times daily.  Dispense: 60 capsule; Refill: 12 - buPROPion (WELLBUTRIN SR) 150 MG 12 hr tablet; Take 1 tablet (150 mg total) by mouth 2 (two) times daily. For the first 3 days take only one (1 tab) per day.  Dispense: 60 tablet; Refill: 1  2. Essential hypertension, benign Patient's blood pressure is not under control with current medication regimen.  This could be exacerbated by increased levels of stress and anxiety and additional weight gain.  The  increased blood pressure could be attributing to the patient's headaches. We will increase the patient's lisinopril-hydrochlorothiazide to 10-12.5 mg/day (full tab). Instructed patient to take blood pressures daily and record them and report back to me at next visit. Patient to follow-up in 4 weeks. - lisinopril-hydrochlorothiazide (ZESTORETIC) 10-12.5 MG tablet; Take 1 tablet by mouth daily.  Dispense: 30 tablet; Refill: 12  3. BMI 45.0-49.9, adult [facility]) Patient does have an elevated BMI and would like to trial medication to help with weight loss. Prescription provided for 30 days of phentermine. We discussed that this is a stimulant medication which may increase her anxiety levels and to monitor these closely and if anxiety continues or gets any worse to discontinue the medication immediately and let me know. Discussed that this medication is not to be taken for long periods of time and at this moment we will trial a 30-day period to see if this helps with her weight loss goals. Patient educated on beginning an exercise regimen of walking a minimum of 20 to 30 minutes a day for at least 5 days out of the week in addition to following a low calorie diet.  Information provided on dietary options for weight loss.  Patient to follow-up in 4 weeks. - phentermine 37.5 MG capsule; Take 1 capsule (37.5 mg total) by mouth every morning.  Dispense: 30 capsule; Refill: 0  4. Insomnia, unspecified type Patient's anxiety and depression are not under control at this time and she is also experiencing frequent headaches which are all interfering with her sleep.  I am hopeful that with the addition of medication for her anxiety and depression and migraine headaches that her sleep will improve. I do not feel that a medication is warranted at this time as she is already taking other medications with sedative side effects.  The patient is unable to take Seroquel, Lyrica, or gabapentin. We will follow up in  approximately 4 weeks  5. Migraine without aura and without status migrainosus, not intractable Patient experiencing multiple migraine headaches throughout the month most likely as a result of increased stress and anxiety.  I am hopeful that getting her anxiety and depression under control will help eliminate some of the symptoms she is experiencing.  Patient has had success with Maxalt in the past.  We will retry this for her current symptoms. Prescription provided with instructions on how to take medication. Patient to follow-up in 4 weeks - rizatriptan (MAXALT) 10 MG tablet; Take 1 tablet (10 mg total) by mouth as needed for migraine. May repeat in 2 hours if needed  Dispense: 10 tablet; Refill: 12  Discussed with the patient that she is due for her Pap smear.  She reports she will schedule this appointment with her OB/GYN, Dr. Tressia Danas.  Follow-up: Return in about 4 weeks (around 05/13/2019) for Weight/Mood.    Orma Render, NP

## 2019-04-19 ENCOUNTER — Emergency Department (HOSPITAL_COMMUNITY): Payer: BLUE CROSS/BLUE SHIELD

## 2019-04-19 ENCOUNTER — Other Ambulatory Visit: Payer: Self-pay

## 2019-04-19 ENCOUNTER — Inpatient Hospital Stay (HOSPITAL_COMMUNITY)
Admission: EM | Admit: 2019-04-19 | Discharge: 2019-04-21 | DRG: 392 | Disposition: A | Discharge status: Home / Self Care | Payer: BLUE CROSS/BLUE SHIELD | Attending: Internal Medicine | Admitting: Internal Medicine

## 2019-04-19 ENCOUNTER — Encounter (HOSPITAL_COMMUNITY): Payer: Self-pay | Admitting: Emergency Medicine

## 2019-04-19 DIAGNOSIS — Z818 Family history of other mental and behavioral disorders: Secondary | ICD-10-CM

## 2019-04-19 DIAGNOSIS — E860 Dehydration: Secondary | ICD-10-CM | POA: Diagnosis not present

## 2019-04-19 DIAGNOSIS — Z823 Family history of stroke: Secondary | ICD-10-CM

## 2019-04-19 DIAGNOSIS — Z720 Tobacco use: Secondary | ICD-10-CM | POA: Diagnosis present

## 2019-04-19 DIAGNOSIS — M797 Fibromyalgia: Secondary | ICD-10-CM | POA: Diagnosis present

## 2019-04-19 DIAGNOSIS — Z79899 Other long term (current) drug therapy: Secondary | ICD-10-CM

## 2019-04-19 DIAGNOSIS — Z969 Presence of functional implant, unspecified: Secondary | ICD-10-CM

## 2019-04-19 DIAGNOSIS — R1030 Lower abdominal pain, unspecified: Secondary | ICD-10-CM | POA: Diagnosis present

## 2019-04-19 DIAGNOSIS — F1120 Opioid dependence, uncomplicated: Secondary | ICD-10-CM | POA: Diagnosis not present

## 2019-04-19 DIAGNOSIS — F329 Major depressive disorder, single episode, unspecified: Secondary | ICD-10-CM | POA: Diagnosis present

## 2019-04-19 DIAGNOSIS — R739 Hyperglycemia, unspecified: Secondary | ICD-10-CM | POA: Diagnosis not present

## 2019-04-19 DIAGNOSIS — I10 Essential (primary) hypertension: Secondary | ICD-10-CM | POA: Diagnosis not present

## 2019-04-19 DIAGNOSIS — A419 Sepsis, unspecified organism: Secondary | ICD-10-CM | POA: Diagnosis not present

## 2019-04-19 DIAGNOSIS — Z9049 Acquired absence of other specified parts of digestive tract: Secondary | ICD-10-CM | POA: Diagnosis not present

## 2019-04-19 DIAGNOSIS — Z20822 Contact with and (suspected) exposure to covid-19: Secondary | ICD-10-CM | POA: Diagnosis present

## 2019-04-19 DIAGNOSIS — K529 Noninfective gastroenteritis and colitis, unspecified: Principal | ICD-10-CM | POA: Diagnosis present

## 2019-04-19 DIAGNOSIS — Z6841 Body Mass Index (BMI) 40.0 and over, adult: Secondary | ICD-10-CM

## 2019-04-19 DIAGNOSIS — R7989 Other specified abnormal findings of blood chemistry: Secondary | ICD-10-CM

## 2019-04-19 DIAGNOSIS — G894 Chronic pain syndrome: Secondary | ICD-10-CM | POA: Diagnosis not present

## 2019-04-19 DIAGNOSIS — F419 Anxiety disorder, unspecified: Secondary | ICD-10-CM | POA: Diagnosis present

## 2019-04-19 DIAGNOSIS — K5900 Constipation, unspecified: Secondary | ICD-10-CM | POA: Diagnosis present

## 2019-04-19 DIAGNOSIS — K219 Gastro-esophageal reflux disease without esophagitis: Secondary | ICD-10-CM | POA: Diagnosis present

## 2019-04-19 DIAGNOSIS — R6521 Severe sepsis with septic shock: Secondary | ICD-10-CM | POA: Diagnosis not present

## 2019-04-19 DIAGNOSIS — R6510 Systemic inflammatory response syndrome (SIRS) of non-infectious origin without acute organ dysfunction: Secondary | ICD-10-CM | POA: Diagnosis present

## 2019-04-19 DIAGNOSIS — Z825 Family history of asthma and other chronic lower respiratory diseases: Secondary | ICD-10-CM

## 2019-04-19 DIAGNOSIS — J45909 Unspecified asthma, uncomplicated: Secondary | ICD-10-CM | POA: Diagnosis present

## 2019-04-19 DIAGNOSIS — M549 Dorsalgia, unspecified: Secondary | ICD-10-CM | POA: Diagnosis present

## 2019-04-19 DIAGNOSIS — Z9682 Presence of neurostimulator: Secondary | ICD-10-CM

## 2019-04-19 DIAGNOSIS — N179 Acute kidney failure, unspecified: Secondary | ICD-10-CM

## 2019-04-19 DIAGNOSIS — K582 Mixed irritable bowel syndrome: Secondary | ICD-10-CM

## 2019-04-19 HISTORY — DX: Dehydration: E86.0

## 2019-04-19 HISTORY — DX: Other specified abnormal findings of blood chemistry: R79.89

## 2019-04-19 HISTORY — DX: Sepsis, unspecified organism: A41.9

## 2019-04-19 HISTORY — DX: Tobacco use: Z72.0

## 2019-04-19 HISTORY — DX: Hyperglycemia, unspecified: R73.9

## 2019-04-19 LAB — CBC WITH DIFFERENTIAL/PLATELET
Abs Immature Granulocytes: 0.17 10*3/uL — ABNORMAL HIGH (ref 0.00–0.07)
Basophils Absolute: 0.1 10*3/uL (ref 0.0–0.1)
Basophils Relative: 0 %
Eosinophils Absolute: 0 10*3/uL (ref 0.0–0.5)
Eosinophils Relative: 0 %
HCT: 47.3 % — ABNORMAL HIGH (ref 36.0–46.0)
Hemoglobin: 14.6 g/dL (ref 12.0–15.0)
Immature Granulocytes: 1 %
Lymphocytes Relative: 12 %
Lymphs Abs: 2.3 10*3/uL (ref 0.7–4.0)
MCH: 28.5 pg (ref 26.0–34.0)
MCHC: 30.9 g/dL (ref 30.0–36.0)
MCV: 92.4 fL (ref 80.0–100.0)
Monocytes Absolute: 1.1 10*3/uL — ABNORMAL HIGH (ref 0.1–1.0)
Monocytes Relative: 6 %
Neutro Abs: 15.4 10*3/uL — ABNORMAL HIGH (ref 1.7–7.7)
Neutrophils Relative %: 81 %
Platelets: 370 10*3/uL (ref 150–400)
RBC: 5.12 MIL/uL — ABNORMAL HIGH (ref 3.87–5.11)
RDW: 13.9 % (ref 11.5–15.5)
WBC: 19 10*3/uL — ABNORMAL HIGH (ref 4.0–10.5)
nRBC: 0 % (ref 0.0–0.2)

## 2019-04-19 LAB — LACTIC ACID, PLASMA
Lactic Acid, Venous: 3.1 mmol/L (ref 0.5–1.9)
Lactic Acid, Venous: 2.1 mmol/L (ref 0.5–1.9)

## 2019-04-19 LAB — COMPREHENSIVE METABOLIC PANEL
ALT: 26 U/L (ref 0–44)
AST: 25 U/L (ref 15–41)
Albumin: 4.3 g/dL (ref 3.5–5.0)
Alkaline Phosphatase: 122 U/L (ref 38–126)
Anion gap: 14 (ref 5–15)
BUN: 19 mg/dL (ref 6–20)
CO2: 25 mmol/L (ref 22–32)
Calcium: 9.8 mg/dL (ref 8.9–10.3)
Chloride: 96 mmol/L — ABNORMAL LOW (ref 98–111)
Creatinine, Ser: 1.77 mg/dL — ABNORMAL HIGH (ref 0.44–1.00)
GFR calc Af Amer: 40 mL/min — ABNORMAL LOW (ref >60)
GFR calc non Af Amer: 35 mL/min — ABNORMAL LOW (ref >60)
Glucose, Bld: 209 mg/dL — ABNORMAL HIGH (ref 70–99)
Potassium: 4.1 mmol/L (ref 3.5–5.1)
Sodium: 135 mmol/L (ref 135–145)
Total Bilirubin: 1.1 mg/dL (ref 0.3–1.2)
Total Protein: 8.5 g/dL — ABNORMAL HIGH (ref 6.5–8.1)

## 2019-04-19 LAB — RAPID URINE DRUG SCREEN, HOSP PERFORMED
Amphetamines: NOT DETECTED
Barbiturates: NOT DETECTED
Benzodiazepines: NOT DETECTED
Cocaine: NOT DETECTED
Opiates: POSITIVE — AB
Tetrahydrocannabinol: NOT DETECTED

## 2019-04-19 LAB — URINALYSIS, ROUTINE W REFLEX MICROSCOPIC
Bacteria, UA: NONE SEEN
Bilirubin Urine: NEGATIVE
Glucose, UA: NEGATIVE mg/dL
Hgb urine dipstick: NEGATIVE
Ketones, ur: NEGATIVE mg/dL
Leukocytes,Ua: NEGATIVE
Nitrite: NEGATIVE
Protein, ur: 30 mg/dL — AB
Specific Gravity, Urine: 1.039 — ABNORMAL HIGH (ref 1.005–1.030)
pH: 6 (ref 5.0–8.0)

## 2019-04-19 LAB — LIPASE, BLOOD: Lipase: 23 U/L (ref 11–51)

## 2019-04-19 LAB — BLOOD GAS, VENOUS
Acid-base deficit: 3.1 mmol/L — ABNORMAL HIGH (ref 0.0–2.0)
Bicarbonate: 24.7 mmol/L (ref 20.0–28.0)
O2 Saturation: 34.9 %
Patient temperature: 98.6
pCO2, Ven: 58.7 mmHg (ref 44.0–60.0)
pH, Ven: 7.247 — ABNORMAL LOW (ref 7.250–7.430)

## 2019-04-19 LAB — MAGNESIUM: Magnesium: 2.1 mg/dL (ref 1.7–2.4)

## 2019-04-19 LAB — TROPONIN I (HIGH SENSITIVITY)
Troponin I (High Sensitivity): 2 ng/L (ref <18)
Troponin I (High Sensitivity): 2 ng/L (ref <18)

## 2019-04-19 LAB — CREATININE, URINE, RANDOM: Creatinine, Urine: 47.2 mg/dL

## 2019-04-19 LAB — PROCALCITONIN: Procalcitonin: <0.1 ng/mL

## 2019-04-19 LAB — TSH: TSH: 1.586 u[IU]/mL (ref 0.350–4.500)

## 2019-04-19 LAB — SALICYLATE LEVEL: Salicylate Lvl: <7 mg/dL — ABNORMAL LOW (ref 7.0–30.0)

## 2019-04-19 LAB — CK: Total CK: 68 U/L (ref 38–234)

## 2019-04-19 LAB — MRSA PCR SCREENING: MRSA by PCR: NEGATIVE

## 2019-04-19 LAB — I-STAT BETA HCG BLOOD, ED (MC, WL, AP ONLY): I-stat hCG, quantitative: <5 m[IU]/mL (ref <5)

## 2019-04-19 LAB — CBG MONITORING, ED: Glucose-Capillary: 167 mg/dL — ABNORMAL HIGH (ref 70–99)

## 2019-04-19 LAB — PHOSPHORUS: Phosphorus: 3.4 mg/dL (ref 2.5–4.6)

## 2019-04-19 LAB — ETHANOL: Alcohol, Ethyl (B): <10 mg/dL (ref <10)

## 2019-04-19 LAB — SODIUM, URINE, RANDOM: Sodium, Ur: 39 mmol/L

## 2019-04-19 LAB — ACETAMINOPHEN LEVEL: Acetaminophen (Tylenol), Serum: <10 ug/mL — ABNORMAL LOW (ref 10–30)

## 2019-04-19 MED ORDER — CHLORHEXIDINE GLUCONATE CLOTH 2 % EX PADS
6.0000 | MEDICATED_PAD | Freq: Every day | CUTANEOUS | Status: DC
  Administered 2019-04-20: 6 via TOPICAL

## 2019-04-19 MED ORDER — PIPERACILLIN-TAZOBACTAM 3.375 G IVPB
3.3750 g | Freq: Three times a day (TID) | INTRAVENOUS | Status: DC
  Administered 2019-04-20 – 2019-04-21 (×4): 3.375 g via INTRAVENOUS
  Filled 2019-04-19 (×4): qty 50

## 2019-04-19 MED ORDER — PIPERACILLIN-TAZOBACTAM 3.375 G IVPB
3.3750 g | Freq: Once | INTRAVENOUS | Status: AC
  Administered 2019-04-19: 3.375 g via INTRAVENOUS
  Filled 2019-04-19: qty 50

## 2019-04-19 MED ORDER — ACETAMINOPHEN 325 MG PO TABS
650.0000 mg | ORAL_TABLET | Freq: Four times a day (QID) | ORAL | Status: DC | PRN
  Administered 2019-04-21: 650 mg via ORAL
  Filled 2019-04-19: qty 2

## 2019-04-19 MED ORDER — ONDANSETRON HCL 4 MG/2ML IJ SOLN
4.0000 mg | Freq: Four times a day (QID) | INTRAMUSCULAR | Status: DC | PRN
  Administered 2019-04-19: 4 mg via INTRAVENOUS
  Filled 2019-04-19: qty 2

## 2019-04-19 MED ORDER — CLONAZEPAM 0.125 MG PO TBDP: 0.2500 mg | ORAL_TABLET | Freq: Two times a day (BID) | ORAL | Status: DC | PRN

## 2019-04-19 MED ORDER — ALBUTEROL SULFATE (2.5 MG/3ML) 0.083% IN NEBU: 2.5000 mg | INHALATION_SOLUTION | Freq: Four times a day (QID) | RESPIRATORY_TRACT | Status: DC | PRN

## 2019-04-19 MED ORDER — ACETAMINOPHEN 650 MG RE SUPP: 650.0000 mg | Freq: Four times a day (QID) | RECTAL | Status: DC | PRN

## 2019-04-19 MED ORDER — HYDROCODONE-ACETAMINOPHEN 5-325 MG PO TABS
1.0000 | ORAL_TABLET | ORAL | Status: DC | PRN
  Administered 2019-04-19 – 2019-04-21 (×8): 2 via ORAL
  Filled 2019-04-19 (×8): qty 2

## 2019-04-19 MED ORDER — SODIUM CHLORIDE (PF) 0.9 % IJ SOLN
INTRAMUSCULAR | Status: AC
  Filled 2019-04-19: qty 50

## 2019-04-19 MED ORDER — SODIUM CHLORIDE 0.9 % IV SOLN: INTRAVENOUS | Status: AC

## 2019-04-19 MED ORDER — SODIUM CHLORIDE 0.9 % IV BOLUS
1000.0000 mL | Freq: Once | INTRAVENOUS | Status: AC
  Administered 2019-04-19: 1000 mL via INTRAVENOUS

## 2019-04-19 MED ORDER — VANCOMYCIN HCL IN DEXTROSE 1-5 GM/200ML-% IV SOLN
1000.0000 mg | Freq: Once | INTRAVENOUS | Status: AC
  Administered 2019-04-19: 1000 mg via INTRAVENOUS
  Filled 2019-04-19: qty 200

## 2019-04-19 MED ORDER — SODIUM CHLORIDE 0.9 % IV BOLUS (SEPSIS): 500.0000 mL | Freq: Once | INTRAVENOUS | Status: DC

## 2019-04-19 MED ORDER — INSULIN ASPART 100 UNIT/ML ~~LOC~~ SOLN
0.0000 [IU] | SUBCUTANEOUS | Status: DC
  Administered 2019-04-20 (×2): 2 [IU] via SUBCUTANEOUS

## 2019-04-19 MED ORDER — HYDROMORPHONE HCL 1 MG/ML IJ SOLN
1.0000 mg | Freq: Once | INTRAMUSCULAR | Status: AC
  Administered 2019-04-19: 1 mg via INTRAVENOUS
  Filled 2019-04-19: qty 1

## 2019-04-19 MED ORDER — IOHEXOL 350 MG/ML SOLN
100.0000 mL | Freq: Once | INTRAVENOUS | Status: AC | PRN
  Administered 2019-04-19: 100 mL via INTRAVENOUS

## 2019-04-19 MED ORDER — ALBUTEROL SULFATE HFA 108 (90 BASE) MCG/ACT IN AERS: 1.0000 | INHALATION_SPRAY | Freq: Four times a day (QID) | RESPIRATORY_TRACT | Status: DC | PRN

## 2019-04-19 MED ORDER — ONDANSETRON HCL 4 MG PO TABS
4.0000 mg | ORAL_TABLET | Freq: Four times a day (QID) | ORAL | Status: DC | PRN
  Administered 2019-04-21: 4 mg via ORAL
  Filled 2019-04-19: qty 1

## 2019-04-19 MED ORDER — HYDROMORPHONE HCL 2 MG/ML IJ SOLN
INTRAMUSCULAR | Status: AC
  Administered 2019-04-19: 2 mg
  Filled 2019-04-19: qty 1

## 2019-04-19 MED ORDER — ALUM & MAG HYDROXIDE-SIMETH 200-200-20 MG/5ML PO SUSP
15.0000 mL | ORAL | Status: DC | PRN
  Administered 2019-04-19: 15 mL via ORAL
  Filled 2019-04-19: qty 30

## 2019-04-19 NOTE — ED Notes (Signed)
Pt did not call out to have BM but when staff went into room, pt stated she had BM in her brief. Writer asked pt if we could clean her up but pt preferred to be cleaned up once moved into her inpatient bed. Charge RN Ilene made aware of pt's request.

## 2019-04-19 NOTE — ED Notes (Signed)
This NT and Earnest Bailey, RN transported pt to ICU. Upon arrival to unit, pt was provided with peri care by ED staff due to having a soiled brief. ICU staff completed CHG bath with ED present cleaning pt. ICU staff aware that pt requested to be cleaned on unit so that she was not cleaned/moved twice due to getting nauseated when moving. ICU staff also made aware of the same. Pt's belongings also transported to ICU rm [id number]. Charge RN Ilene aware.

## 2019-04-19 NOTE — ED Notes (Signed)
Patient cleaned up, peri care, and linens changed x2. Patient has severe diarrhea due to colitis. Will continue to monitor.

## 2019-04-19 NOTE — ED Notes (Addendum)
6L bolus completed using pressure bag. Steady BP 50s. 1L bolus going now, no pressure bag. BP 112/59 now.

## 2019-04-19 NOTE — ED Notes (Signed)
Upon arrival to ICU, ED staff cleaned patient and provided peri care while ICU staff wiped patient with CHG wipes per patient request.

## 2019-04-19 NOTE — ED Triage Notes (Addendum)
Per significant other severe abdominal pain; just was in the bathroom for 45 minutes with diarrhea PTA; since pt is altered and not acting like self. Pt in and out of conscience with triage. Describes abdominal pain and alert to self.  Pt new medication Wellbutrin yesterday; "took only prescribed medicine."

## 2019-04-19 NOTE — ED Provider Notes (Signed)
[facility] DEPT Provider Note   CSN: [id number] Arrival date & time: 04/19/19  1712     History Chief Complaint  Patient presents with   Abdominal Pain   Altered Mental Status    DEVANNY REISSIG is a 44 y.o. female.  Patient is a 44 year old female with past medical history of hypertension, fibromyalgia, chronic pain syndrome, irritable bowel, and bronchitis.  She presents today by private auto for evaluation of severe abdominal pain.  I am told she was in the bathroom having diarrhea.  She was in the bathroom for approximately 45 minutes and when she returned was not behaving normally.  She seemed confused and difficult to arouse.  Patient transports her here for evaluation of this.  Patient denies to me she had any bloody vomit or stool.  She denies any fevers or chills.  She has a history of depression and was apparently started on Wellbutrin yesterday.  The history is provided by the patient.  Abdominal Pain Pain location:  Generalized Pain quality: cramping   Pain radiates to:  Does not radiate Pain severity:  Moderate Onset quality:  Sudden Timing:  Constant Progression:  Unchanged Altered Mental Status Associated symptoms: abdominal pain        Past Medical History:  Diagnosis Date   Abscess of axillary fold 10/10/2013   Acute bronchitis with asthma with acute exacerbation 01/23/2015   Fibromyalgia    Hypertension    Lupus (HCC)    Osteoarthritis    Pain    chronic regional pain syndrome   Sepsis affecting skin 10/10/2013    Patient Active Problem List   Diagnosis Date Noted   Osteoarthritis involving multiple joints 04/08/2019   Latex precautions, history of latex allergy 04/08/2019   Acute postoperative pain 04/08/2019   Spondylosis without myelopathy or radiculopathy, lumbosacral region 02/25/2019   DDD (degenerative disc disease), lumbar 02/25/2019   DDD (degenerative disc disease), cervical 11/15/2018     Cervicalgia 11/15/2018   Abnormal MRI, shoulder (Right) 11/15/2018   Chronic upper extremity pain (Secondary area of Pain) (Bilateral) (L>R) 08/20/2018   Chronic hip pain (Third area of Pain) (Bilateral) (R>L) 08/20/2018   Closed fracture of scapula, sequela (Right) 08/20/2018   Pharmacologic therapy 08/20/2018   Disorder of skeletal system 08/20/2018   Problems influencing health status 08/20/2018   Pain in right knee 03/19/2017   Family history of thyroid disease 10/24/2016   Neurogenic pain 10/10/2016   Fibromyalgia 10/10/2016   Spinal cord stimulator status 05/16/2016   Battery end of life of spinal cord stimulator 05/16/2016   Positive ANA (antinuclear antibody) 09/13/2015   Lumbar spondylosis 06/29/2015   Lumbar facet syndrome (Bilateral) (R>L) 06/29/2015   Implant pain at battery site (right buttocks area) 04/05/2015   Anxiety and depression 01/20/2015   Right knee pain 01/10/2015   Long term current use of opiate analgesic 12/19/2014   Long term prescription opiate use 12/19/2014   Opiate use (30 MME/Day) 12/19/2014   Opiate dependence ([place]) 12/19/2014   Encounter for therapeutic drug level monitoring 12/19/2014   Chronic musculoskeletal pain 12/19/2014   Muscle cramping 12/19/2014   Osteoarthrosis 12/19/2014   Vitamin D insufficiency 12/19/2014   Presence of functional implant ([employer]tronic Lumbar spinal cord stimulator implant) 12/19/2014   Encounter for management of implanted device 12/19/2014   Chronic low back pain (Bilateral) (R>L) 12/19/2014   Chronic lumbar radicular pain (Right L5 dermatome; Left S1 Dermatome) (Bilateral) (R>L) 12/19/2014   Chronic neck pain (Primary Area of Pain) (Bilateral) (  L>R) 12/19/2014   Morbid obesity ([place]) 12/19/2014   Nicotine dependence 12/19/2014   Chronic cervical radicular pain (C7 Dermatome) (Bilateral) (L>R) 12/19/2014   Chronic lower extremity pain (Bilateral) (R>L) 12/19/2014   Severe  obesity with body mass index (BMI) of 35.0 to 39.9 with comorbidity ([place]) 10/27/2014   Generalized anxiety disorder 03/16/2014   Essential hypertension, benign 07/09/2013   Chronic pain syndrome 07/09/2013   GERD 07/02/2007   Irritable bowel syndrome 07/02/2007   KIDNEY STONES 07/02/2007    Past Surgical History:  Procedure Laterality Date   BREAST SURGERY     CHOLECYSTECTOMY  2000   ENDOMETRIAL ABLATION     KNEE SURGERY     KNEE SURGERY Right 02-2015   OCCIPITAL NERVE STIMULATOR INSERTION     SHOULDER ARTHROSCOPY WITH DISTAL CLAVICLE RESECTION Right 10/29/2018   Procedure: SHOULDER ARTHROSCOPY WITH DISTAL CLAVICLE RESECTION;  Surgeon: Hiram Gash, MD;  Location: [facility];  Service: Orthopedics;  Laterality: Right;   SHOULDER SURGERY     spinal cord stimulator     SPINAL CORD STIMULATOR IMPLANT  2008   SPINAL CORD STIMULATOR INSERTION N/A 06/21/2016   Procedure: LUMBAR SPINAL CORD STIMULATOR INSERTION;  Surgeon: Clydell Hakim, MD;  Location: [facility];  Service: Neurosurgery;  Laterality: N/A;  LUMBAR SPINAL CORD STIMULATOR INSERTION   SUBACROMIAL DECOMPRESSION Right 10/29/2018   Procedure: SUBACROMIAL DECOMPRESSION;  Surgeon: Hiram Gash, MD;  Location: [facility];  Service: Orthopedics;  Laterality: Right;   TUBAL LIGATION  2000     OB History   No obstetric history on file.     Family History  Problem Relation Age of Onset   Depression Mother        maternal grandmother   Asthma Mother    Fibromyalgia Other        aunt   Stroke Other        grandmother   Thyroid disease Other        grandmother    Social History   Tobacco Use   Smoking status: Former Smoker    Types: Cigarettes   Smokeless tobacco: Former Systems developer    Quit date: 12/22/2017  Substance Use Topics   Alcohol use: No    Alcohol/week: 0.0 standard drinks   Drug use: No    Home Medications Prior to Admission medications   Medication Sig Start  Date End Date Taking? Authorizing Provider  albuterol (PROVENTIL HFA;VENTOLIN HFA) 108 (90 Base) MCG/ACT inhaler Inhale 1-2 puffs into the lungs every 6 (six) hours as needed for wheezing or shortness of breath. 03/04/16   Fransico Meadow, PA-C  buPROPion (WELLBUTRIN SR) 150 MG 12 hr tablet Take 1 tablet (150 mg total) by mouth daily for 3 days, THEN 1 tablet (150 mg total) 2 (two) times daily. 04/15/19 05/18/19  Orma Render, NP  clonazePAM (KLONOPIN) 0.5 MG tablet Take 0.5-1 tablets (0.25-0.5 mg total) by mouth 2 (two) times daily as needed for anxiety. 04/15/19   Orma Render, NP  DULoxetine (CYMBALTA) 60 MG capsule Take 1 capsule (60 mg total) by mouth 2 (two) times daily. 04/15/19   Orma Render, NP  HYDROcodone-acetaminophen (NORCO/VICODIN) 5-325 MG tablet Take 1 tablet by mouth every 8 (eight) hours as needed for up to 7 days for severe pain. Must last for 7 days. 04/15/19 04/22/19  Milinda Pointer, MD  lisinopril-hydrochlorothiazide (ZESTORETIC) 10-12.5 MG tablet Take 1 tablet by mouth daily. 04/15/19   Orma Render, NP  methocarbamol (ROBAXIN)  750 MG tablet Take 1 tablet (750 mg total) by mouth every 8 (eight) hours as needed for muscle spasms. 02/22/19 08/21/19  Milinda Pointer, MD  omeprazole (PRILOSEC) 40 MG capsule Take 1 capsule (40 mg total) by mouth daily. Patient taking differently: Take 700 mg by mouth daily.  10/24/16   Emeterio Reeve, DO  oxyCODONE (OXY IR/ROXICODONE) 5 MG immediate release tablet Take 1 tablet (5 mg total) by mouth every 6 (six) hours as needed for severe pain. Must last 30 days 03/24/19 04/23/19  Milinda Pointer, MD  oxyCODONE (OXY IR/ROXICODONE) 5 MG immediate release tablet Take 1 tablet (5 mg total) by mouth every 6 (six) hours as needed for severe pain. Must last 30 days 04/23/19 05/23/19  Milinda Pointer, MD  oxyCODONE (OXY IR/ROXICODONE) 5 MG immediate release tablet Take 1 tablet (5 mg total) by mouth every 6 (six) hours as needed for severe pain. Must last 30  days 05/23/19 06/22/19  Milinda Pointer, MD  phentermine 37.5 MG capsule Take 1 capsule (37.5 mg total) by mouth every morning. 04/15/19   Orma Render, NP  rizatriptan (MAXALT) 10 MG tablet Take 1 tablet (10 mg total) by mouth as needed for migraine. May repeat in 2 hours if needed 04/15/19   Early, Coralee Pesa, NP    Allergies    Gabapentin, Celebrex [celecoxib], Pentazocine lactate, Seroquel [quetiapine], Amoxicillin, Bactrim [sulfamethoxazole-trimethoprim], Doxycycline, Erythromycin, Hydromorphone hcl, Keflex [cephalexin], Latex, Lyrica [pregabalin], and Nabumetone  Review of Systems   Review of Systems  Gastrointestinal: Positive for abdominal pain.  All other systems reviewed and are negative.   Physical Exam Updated Vital Signs BP (!) 76/65    Pulse 95    Resp (!) 26    Ht 5\' 4"  (1.626 m)    Wt 124.7 kg    LMP 04/04/2019    SpO2 99%    BMI 47.20 kg/m   Physical Exam Vitals and nursing note reviewed.  Constitutional:      General: She is not in acute distress.    Appearance: She is well-developed. She is not diaphoretic.  HENT:     Head: Normocephalic and atraumatic.  Cardiovascular:     Rate and Rhythm: Normal rate and regular rhythm.     Heart sounds: No murmur. No friction rub. No gallop.   Pulmonary:     Effort: Pulmonary effort is normal. No respiratory distress.     Breath sounds: Normal breath sounds. No wheezing.  Abdominal:     General: Bowel sounds are normal. There is no distension.     Palpations: Abdomen is soft.     Tenderness: There is generalized abdominal tenderness. There is no right CVA tenderness, left CVA tenderness, guarding or rebound.  Musculoskeletal:        General: Normal range of motion.     Cervical back: Normal range of motion and neck supple.  Skin:    General: Skin is warm and dry.  Neurological:     Mental Status: She is alert and oriented to person, place, and time.     ED Results / Procedures / Treatments   Labs (all labs ordered are  listed, but only abnormal results are displayed) Labs Reviewed  COMPREHENSIVE METABOLIC PANEL  ETHANOL  LIPASE, BLOOD  SALICYLATE LEVEL  ACETAMINOPHEN LEVEL  LACTIC ACID, PLASMA  LACTIC ACID, PLASMA  URINALYSIS, ROUTINE W REFLEX MICROSCOPIC  RAPID URINE DRUG SCREEN, HOSP PERFORMED  I-STAT BETA HCG BLOOD, ED ([facility], [facility], [facility] ONLY)    EKG EKG Interpretation  Date/Time:  Monday April 19 2019 17:24:00 EST Ventricular Rate:  98 PR Interval:    QRS Duration: 98 QT Interval:  361 QTC Calculation: 461 R Axis:   32 Text Interpretation: Sinus rhythm Normal ECG Confirmed by Veryl Speak [phone]) on 04/19/2019 7:17:31 PM   Radiology No results found.  Procedures Procedures (including critical care time)  Medications Ordered in ED Medications  sodium chloride 0.9 % bolus 1,000 mL (has no administration in time range)    ED Course  I have reviewed the triage vital signs and the nursing notes.  Pertinent labs & imaging results that were available during my care of the patient were reviewed by me and considered in my medical decision making (see chart for details).    MDM Rules/Calculators/A&P  Patient is a 44 year old female presenting here with complaints of pain in her chest and abdomen that started acutely this afternoon.  She has had several episodes of diarrhea.  She arrived here extremely weak and hypotensive.  Her initial blood pressures were 76/65 and patient was somnolent, but arousable and oriented.  IV fluids were immediately initiated and work-up started.  As the patient remained hypotensive, she was taken to radiology for an emergent CT scan of her abdomen and chest to rule acute intraabdominal or intrathoracic pathology.  This showed no evidence for thoracic pathology, but did show findings consistent with diffuse colitis.  Patient given IV antibiotics and blood cultures obtained.  Laboratory studies show a white count of 19,000, but stable hemoglobin.  Her lactate is  elevated at 3.1.  She does have a mild bump in her creatinine to 1.8 which is higher than baseline.  Patient received a total of 4 L of normal saline here in the ER and blood pressure seem to be improving.  Her pain was then treated with Dilaudid and she now appears clinically much improved.  I suspect her initial hypotension was related to diarrhea/fluid shift as well as a vagal response to her pain and diarrhea.  At this point, patient will be admitted to the hospitalist service for further work-up and care.  CRITICAL CARE Performed by: Veryl Speak Total critical care time: 70 minutes Critical care time was exclusive of separately billable procedures and treating other patients. Critical care was necessary to treat or prevent imminent or life-threatening deterioration. Critical care was time spent personally by me on the following activities: development of treatment plan with patient and/or surrogate as well as nursing, discussions with consultants, evaluation of patient's response to treatment, examination of patient, obtaining history from patient or surrogate, ordering and performing treatments and interventions, ordering and review of laboratory studies, ordering and review of radiographic studies, pulse oximetry and re-evaluation of patient's condition.   Final Clinical Impression(s) / ED Diagnoses Final diagnoses:  None    Rx / DC Orders ED Discharge Orders    None       Veryl Speak, MD 04/19/19 2214

## 2019-04-19 NOTE — H&P (Addendum)
Mary Cruz [id number] DOB: 27-Jul-1975 DOA: 04/19/2019     PCP: Emeterio Reeve, DO   Outpatient Specialists:  NONE  Patient arrived to ER on 04/19/19 at 1712  Patient coming from: home Lives  With family    Chief Complaint:  Chief Complaint  Patient presents with  . Abdominal Pain  . Altered Mental Status    HPI: Mary Cruz is a 44 y.o. female with medical history significant of Lupus, asthma, fibromyalgia, chronic regional pain syndrome, sp spinal cord stimulator, morbid obesity    Presented with   Severe abd pain pt was at her [place] office and had a prolonged trip to the bathroom , her husband found her on the floor due to severe abd pain  Recently started on Buproprion Recently prescribed  phentermine 37.5 MG but has not taken yet Patient take describes severe abdominal pain Husband thought that she was somewhat confused when severe pain and transported her to the emergency department patient denied any blood in vomit or stool.  No fevers or chills. She has had a lot of significant anxiety tachycardia headaches lately and her physician started her on bupropion.  This is only new medications she only took one pill Denies fever, her husband has not been sick. Reports her only intake during the day were pecans and ice cream sandwich. Yesterday she ate at home made spaghetti with tomato meat sauce but she does not feel like any of the food was tainted.   Infectious risk factors:  Reports Diarrhea/abdominal pain,     in house  PCR testing  Pending  Lab Results  Component Value Date   SARSCOV2NAA NOT DETECTED 10/26/2018    Regarding pertinent Chronic problems:      HTN on lisinopril/HCTZ  Morbid obesity-   BMI Readings from Last 1 Encounters:  04/19/19 47.20 kg/m  On phentermine     Asthma -well   controlled on home inhalers    While in ER: Hypotensive Noted to have AKI CTA of abdomen pelvis showed no evidence of dissection but evidence of  colitis  Rehydrated given 4 L of IV fluids initial lactic acid elevated up to 3 repeat pending    The following Work up has been ordered so far:  Orders Placed This Encounter  Procedures  . Culture, blood (routine x 2)  . SARS CORONAVIRUS 2 (TAT 6-24 HRS) Nasopharyngeal Nasopharyngeal Swab  . GI pathogen panel by PCR, stool  . CT Angio Chest/Abd/Pel for Dissection W and/or Wo Contrast  . Comprehensive metabolic panel  . Ethanol  . Lipase, blood  . Salicylate level  . Acetaminophen level  . Lactic acid, plasma  . Urinalysis, Routine w reflex microscopic  . Urine rapid drug screen (hosp performed)  . CBC with Differential  . Consult to hospitalist  ALL PATIENTS BEING ADMITTED/HAVING PROCEDURES NEED COVID-19 SCREENING  . Enteric precautions (UV disinfection) C difficile, Norovirus  . I-Stat beta hCG blood, ED  . CBG monitoring, ED  . EKG 12-Lead    Following Medications were ordered in ER: Medications  sodium chloride (PF) 0.9 % injection (has no administration in time range)  HYDROmorphone (DILAUDID) injection 1 mg (1 mg Intravenous Not Given 04/19/19 1900)  vancomycin (VANCOCIN) IVPB 1000 mg/200 mL premix (1,000 mg Intravenous New Bag/Given 04/19/19 1903)  piperacillin-tazobactam (ZOSYN) IVPB 3.375 g (3.375 g Intravenous New Bag/Given 04/19/19 1904)  sodium chloride 0.9 % bolus 1,000 mL (0 mLs Intravenous Stopped 04/19/19 1803)  iohexol (OMNIPAQUE) 350 MG/ML injection 100  mL (100 mLs Intravenous Contrast Given 04/19/19 1750)  HYDROmorphone (DILAUDID) 2 MG/ML injection (2 mg  Given 04/19/19 1813)        Consult Orders  (From admission, onward)         Start     Ordered   04/19/19 1845  Consult to hospitalist  ALL PATIENTS BEING ADMITTED/HAVING PROCEDURES NEED COVID-19 SCREENING  Once    Comments: ALL PATIENTS BEING ADMITTED/HAVING PROCEDURES NEED COVID-19 SCREENING  Provider:  (Not yet assigned)  Question Answer Comment  Place call to: [facility]   Reason for Consult Admit       04/19/19 1845          Significant initial  Findings: Abnormal Labs Reviewed  COMPREHENSIVE METABOLIC PANEL - Abnormal; Notable for the following components:      Result Value   Chloride 96 (*)    Glucose, Bld 209 (*)    Creatinine, Ser 1.77 (*)    Total Protein 8.5 (*)    GFR calc non Af Amer 35 (*)    GFR calc Af Amer 40 (*)    All other components within normal limits  LACTIC ACID, PLASMA - Abnormal; Notable for the following components:   Lactic Acid, Venous 3.1 (*)    All other components within normal limits  CBC WITH DIFFERENTIAL/PLATELET - Abnormal; Notable for the following components:   WBC 19.0 (*)    RBC 5.12 (*)    HCT 47.3 (*)    Neutro Abs 15.4 (*)    Monocytes Absolute 1.1 (*)    Abs Immature Granulocytes 0.17 (*)    All other components within normal limits  CBG MONITORING, ED - Abnormal; Notable for the following components:   Glucose-Capillary 167 (*)    All other components within normal limits    Otherwise labs showing:    Recent Labs  Lab 04/19/19 1729  NA 135  K 4.1  CO2 25  GLUCOSE 209*  BUN 19  CREATININE 1.77*  CALCIUM 9.8    Cr   Up from baseline see below Lab Results  Component Value Date   CREATININE 1.77 (H) 04/19/2019   CREATININE 1.00 10/26/2018   CREATININE 1.01 10/24/2016    Recent Labs  Lab 04/19/19 1729  AST 25  ALT 26  ALKPHOS 122  BILITOT 1.1  PROT 8.5*  ALBUMIN 4.3   Lab Results  Component Value Date   CALCIUM 9.8 04/19/2019      WBC        Component Value Date/Time   WBC 19.0 (H) 04/19/2019 1816   ANC    Component Value Date/Time   NEUTROABS 15.4 (H) 04/19/2019 1816   ALC No components found for: LYMPHAB    Plt: Lab Results  Component Value Date   PLT 370 04/19/2019    Lactic Acid, Venous 3.1-2.1    Component Value Date/Time   LATICACIDVEN 2.1 (HH) 04/19/2019 1949   Procalcitonin <0.1   COVID-19 Labs  No results for input(s): DDIMER, FERRITIN, LDH, CRP in the last 72  hours.  Lab Results  Component Value Date   SARSCOV2NAA NOT DETECTED 10/26/2018    HG/HCT   stable,      Component Value Date/Time   HGB 14.6 04/19/2019 1816   HCT 47.3 (H) 04/19/2019 1816    Recent Labs  Lab 04/19/19 1729  LIPASE 23     Troponin 2 Cardiac Panel (last 3 results) Recent Labs    04/19/19 1955  CKTOTAL 68  ECG: Ordered Personally reviewed by me showing: HR : 98  Rhythm:  NSR,   no evidence of ischemic changes QTC 461    CBG (last 3)  Recent Labs    04/19/19 1720  GLUCAP 167*     UA   no evidence of UTI      Urine analysis:    Component Value Date/Time   COLORURINE AMBER (A) 04/19/2019 1949   APPEARANCEUR CLEAR 04/19/2019 1949   LABSPEC 1.039 (H) 04/19/2019 1949   PHURINE 6.0 04/19/2019 1949   GLUCOSEU NEGATIVE 04/19/2019 1949   HGBUR NEGATIVE 04/19/2019 1949   BILIRUBINUR NEGATIVE 04/19/2019 1949   BILIRUBINUR neg 07/22/2016 1559   KETONESUR NEGATIVE 04/19/2019 1949   PROTEINUR 30 (A) 04/19/2019 1949   UROBILINOGEN 0.2 07/22/2016 1559   UROBILINOGEN 1.0 10/09/2013 2331   NITRITE NEGATIVE 04/19/2019 1949   LEUKOCYTESUR NEGATIVE 04/19/2019 1949      Ordered    CTabd/pelvis - diffuse colitis    ED Triage Vitals  Enc Vitals Group     BP 04/19/19 1724 (!) 76/65     Pulse Rate 04/19/19 1724 95     Resp 04/19/19 1724 (!) 26     Temp 04/19/19 1802 (!) 96 F (35.6 C)     Temp Source 04/19/19 1802 Rectal     SpO2 04/19/19 1724 99 %     Weight 04/19/19 1725 275 lb (124.7 kg)     Height 04/19/19 1725 5\' 4"  (1.626 m)     Head Circumference --      Peak Flow --      Pain Score 04/19/19 1724 10     Pain Loc --      Pain Edu? --      Excl. in [place]? --   TMAX(24)@       Latest  Blood pressure 114/83, pulse 92, temperature (!) 96 F (35.6 C), temperature source Rectal, resp. rate (!) 23, height 5\' 4"  (1.626 m), weight 124.7 kg, last menstrual period 04/04/2019, SpO2 100 %.    Hospitalist was called for admission for dehydration,  colitis resulting gin SIRS, hypotension   Review of Systems:    Pertinent positives include: fatigue, weight loss   Constitutional:  No weight loss, night sweats, Fevers, chills,  HEENT:  No headaches, Difficulty swallowing,Tooth/dental problems,Sore throat,  No sneezing, itching, ear ache, nasal congestion, post nasal drip,  Cardio-vascular:  No chest pain, Orthopnea, PND, anasarca, dizziness, palpitations.no Bilateral lower extremity swelling  GI:  No heartburn, indigestion, abdominal pain, nausea, vomiting, diarrhea, change in bowel habits, loss of appetite, melena, blood in stool, hematemesis Resp:  no shortness of breath at rest. No dyspnea on exertion, No excess mucus, no productive cough, No non-productive cough, No coughing up of blood.No change in color of mucus.No wheezing. Skin:  no rash or lesions. No jaundice GU:  no dysuria, change in color of urine, no urgency or frequency. No straining to urinate.  No flank pain.  Musculoskeletal:  No joint pain or no joint swelling. No decreased range of motion. No back pain.  Psych:  No change in mood or affect. No depression or anxiety. No memory loss.  Neuro: no localizing neurological complaints, no tingling, no weakness, no double vision, no gait abnormality, no slurred speech, no confusion  All systems reviewed and apart from [place] all are negative  Past Medical History:   Past Medical History:  Diagnosis Date  . Abscess of axillary fold 10/10/2013  . Acute bronchitis with asthma with acute exacerbation  01/23/2015  . Fibromyalgia   . Hypertension   . Lupus ([place])   . Osteoarthritis   . Pain    chronic regional pain syndrome  . Sepsis affecting skin 10/10/2013      Past Surgical History:  Procedure Laterality Date  . BREAST SURGERY    . CHOLECYSTECTOMY  2000  . ENDOMETRIAL ABLATION    . KNEE SURGERY    . KNEE SURGERY Right 02-2015  . OCCIPITAL NERVE STIMULATOR INSERTION    . SHOULDER ARTHROSCOPY WITH DISTAL  CLAVICLE RESECTION Right 10/29/2018   Procedure: SHOULDER ARTHROSCOPY WITH DISTAL CLAVICLE RESECTION;  Surgeon: Hiram Gash, MD;  Location: [facility];  Service: Orthopedics;  Laterality: Right;  . SHOULDER SURGERY    . spinal cord stimulator    . SPINAL CORD STIMULATOR IMPLANT  2008  . SPINAL CORD STIMULATOR INSERTION N/A 06/21/2016   Procedure: LUMBAR SPINAL CORD STIMULATOR INSERTION;  Surgeon: Clydell Hakim, MD;  Location: [facility];  Service: Neurosurgery;  Laterality: N/A;  LUMBAR SPINAL CORD STIMULATOR INSERTION  . SUBACROMIAL DECOMPRESSION Right 10/29/2018   Procedure: SUBACROMIAL DECOMPRESSION;  Surgeon: Hiram Gash, MD;  Location: [facility];  Service: Orthopedics;  Laterality: Right;  . TUBAL LIGATION  2000    Social History:  Ambulatory   Independently      reports that she has quit smoking. Her smoking use included cigarettes. She quit smokeless tobacco use about 15 months ago. She reports that she does not drink alcohol or use drugs.    Family History:   Family History  Problem Relation Age of Onset  . Depression Mother        maternal grandmother  . Asthma Mother   . Fibromyalgia Other        aunt  . Stroke Other        grandmother  . Thyroid disease Other        grandmother    Allergies: Allergies  Allergen Reactions  . Gabapentin Swelling  . Celebrex [Celecoxib] Swelling    "Body swelling"  . Pentazocine Lactate Itching and Other (See Comments)    HALLUCINATIONS  . Seroquel [Quetiapine] Other (See Comments)    Falls  . Amoxicillin Diarrhea and Nausea And Vomiting  . Bactrim [Sulfamethoxazole-Trimethoprim] Other (See Comments)    "Makes very sick"  . Doxycycline Other (See Comments)    "SICKNESS"  . Erythromycin Other (See Comments)    RESULTANT UTI  . Hydromorphone Hcl Other (See Comments)    HALLUCINATIONS  . Keflex [Cephalexin] Other (See Comments)    Nausea and causes uti's  . Latex Itching  . Lyrica [Pregabalin]  Other (See Comments)    "Too sleepy"  . Nabumetone Itching      Prior to Admission medications   Medication Sig Start Date End Date Taking? Authorizing Provider  albuterol (PROVENTIL HFA;VENTOLIN HFA) 108 (90 Base) MCG/ACT inhaler Inhale 1-2 puffs into the lungs every 6 (six) hours as needed for wheezing or shortness of breath. 03/04/16   Fransico Meadow, PA-C  buPROPion (WELLBUTRIN SR) 150 MG 12 hr tablet Take 1 tablet (150 mg total) by mouth daily for 3 days, THEN 1 tablet (150 mg total) 2 (two) times daily. 04/15/19 05/18/19  Orma Render, NP  clonazePAM (KLONOPIN) 0.5 MG tablet Take 0.5-1 tablets (0.25-0.5 mg total) by mouth 2 (two) times daily as needed for anxiety. 04/15/19   Orma Render, NP  DULoxetine (CYMBALTA) 60 MG capsule Take 1 capsule (60 mg total) by mouth  2 (two) times daily. 04/15/19   Orma Render, NP  HYDROcodone-acetaminophen (NORCO/VICODIN) 5-325 MG tablet Take 1 tablet by mouth every 8 (eight) hours as needed for up to 7 days for severe pain. Must last for 7 days. 04/15/19 04/22/19  Milinda Pointer, MD  lisinopril-hydrochlorothiazide (ZESTORETIC) 10-12.5 MG tablet Take 1 tablet by mouth daily. 04/15/19   Orma Render, NP  methocarbamol (ROBAXIN) 750 MG tablet Take 1 tablet (750 mg total) by mouth every 8 (eight) hours as needed for muscle spasms. 02/22/19 08/21/19  Milinda Pointer, MD  omeprazole (PRILOSEC) 40 MG capsule Take 1 capsule (40 mg total) by mouth daily. Patient taking differently: Take 700 mg by mouth daily.  10/24/16   Emeterio Reeve, DO  oxyCODONE (OXY IR/ROXICODONE) 5 MG immediate release tablet Take 1 tablet (5 mg total) by mouth every 6 (six) hours as needed for severe pain. Must last 30 days 03/24/19 04/23/19  Milinda Pointer, MD  oxyCODONE (OXY IR/ROXICODONE) 5 MG immediate release tablet Take 1 tablet (5 mg total) by mouth every 6 (six) hours as needed for severe pain. Must last 30 days 04/23/19 05/23/19  Milinda Pointer, MD  oxyCODONE (OXY IR/ROXICODONE)  5 MG immediate release tablet Take 1 tablet (5 mg total) by mouth every 6 (six) hours as needed for severe pain. Must last 30 days 05/23/19 06/22/19  Milinda Pointer, MD  phentermine 37.5 MG capsule Take 1 capsule (37.5 mg total) by mouth every morning. 04/15/19   Orma Render, NP  rizatriptan (MAXALT) 10 MG tablet Take 1 tablet (10 mg total) by mouth as needed for migraine. May repeat in 2 hours if needed 04/15/19   Early, Coralee Pesa, NP   Physical Exam: Blood pressure 114/83, pulse 92, temperature (!) 96 F (35.6 C), temperature source Rectal, resp. rate (!) 23, height 5\' 4"  (1.626 m), weight 124.7 kg, last menstrual period 04/04/2019, SpO2 100 %. 1. General:  in No  Acute distress    Chronically ill  -appearing 2. Psychological: Alert and   Oriented 3. Head/ENT:    Dry Mucous Membranes                          Head Non traumatic, neck supple                            Poor Dentition 4. SKIN:   decreased Skin turgor,  Skin clean Dry and intact no rash 5. Heart: Regular rate and rhythm no  Murmur, no Rub or gallop 6. Lungs:   no wheezes or crackles   7. Abdomen: Soft,  tender, Non distended obese 8. Lower extremities: no clubbing, cyanosis, no  edema 9. Neurologically Grossly intact, moving all 4 extremities equally  10. MSK: Normal range of motion   All other LABS:     Recent Labs  Lab 04/19/19 1816  WBC 19.0*  NEUTROABS 15.4*  HGB 14.6  HCT 47.3*  MCV 92.4  PLT 370     Recent Labs  Lab 04/19/19 1729  NA 135  K 4.1  CL 96*  CO2 25  GLUCOSE 209*  BUN 19  CREATININE 1.77*  CALCIUM 9.8     Recent Labs  Lab 04/19/19 1729  AST 25  ALT 26  ALKPHOS 122  BILITOT 1.1  PROT 8.5*  ALBUMIN 4.3       Cultures:    Component Value Date/Time   SDES BLOOD LEFT ARM  10/10/2013 0043   SPECREQUEST BOTTLES DRAWN AEROBIC AND ANAEROBIC 6CC 10/10/2013 0043   CULT NO GROWTH 6 DAYS 10/10/2013 0043   REPTSTATUS 10/16/2013 FINAL 10/10/2013 0043     Radiological Exams on  Admission: CT Angio Chest/Abd/Pel for Dissection W and/or Wo Contrast  Result Date: 04/19/2019 CLINICAL DATA:  Chest and abdominal pain EXAM: CT ANGIOGRAPHY CHEST, ABDOMEN AND PELVIS TECHNIQUE: Multidetector CT imaging through the chest, abdomen and pelvis was performed using the standard protocol during bolus administration of intravenous contrast. Multiplanar reconstructed images and MIPs were obtained and reviewed to evaluate the vascular anatomy. CONTRAST:  129mL OMNIPAQUE IOHEXOL 350 MG/ML SOLN COMPARISON:  07/22/2016 FINDINGS: CTA CHEST FINDINGS Cardiovascular: Initial precontrast images show no hyperdense crescent to suggest acute aortic abnormality. Post-contrast images demonstrate the thoracic aorta to have a normal branching pattern. No evidence of aneurysmal dilatation or dissection is seen. No cardiac enlargement is seen. No coronary calcifications are noted. The pulmonary artery shows no large central embolus although not timed for embolus evaluation. Mediastinum/Nodes: Thoracic inlet is within normal limits. No hilar or mediastinal adenopathy is noted. The esophagus shows evidence of a sliding-type hiatal hernia distally. Lungs/Pleura: Lungs are well aerated bilaterally. No focal infiltrate or sizable effusion is seen. Mild atelectatic changes are noted in the left lung base. A subpleural parenchymal nodule is noted in the right lower lobe best seen on image number 69 of series 6. This measures approximately 4 mm in greatest dimension. No other sizable nodules are seen. Musculoskeletal: Multiple old rib fractures are noted with healing on the right. No acute rib abnormality is seen. No compression deformity is noted. Review of the MIP images confirms the above findings. CTA ABDOMEN AND PELVIS FINDINGS VASCULAR Aorta: Abdominal aorta shows no aneurysmal dilatation or dissection. Celiac: Patent without evidence of aneurysm, dissection, vasculitis or significant stenosis. SMA: Patent without evidence of  aneurysm, dissection, vasculitis or significant stenosis. Renals: Both renal arteries are patent without evidence of aneurysm, dissection, vasculitis, fibromuscular dysplasia or significant stenosis. IMA: Patent without evidence of aneurysm, dissection, vasculitis or significant stenosis. Iliacs: Patent without evidence of aneurysm, dissection, vasculitis or significant stenosis. Veins: No obvious venous abnormality within the limitations of this arterial phase study. Review of the MIP images confirms the above findings. NON-VASCULAR Hepatobiliary: Liver is diffusely decreased in attenuation consistent with fatty infiltration. The gallbladder has been surgically removed. Pancreas: Unremarkable. No pancreatic ductal dilatation or surrounding inflammatory changes. Spleen: Normal in size without focal abnormality. Adrenals/Urinary Tract: Adrenal glands are within normal limits. Kidneys demonstrate a normal enhancement pattern bilaterally. No renal calculi or urinary tract obstructive changes are seen. Scarring is noted in the upper pole of the left kidney stable from a prior exam of 2018. ureters are within normal limits. The bladder is decompressed. Stomach/Bowel: Colon is well distended with fluid and fecal material consistent with the patient's given clinical history of diarrhea. Some fluid-filled loops of small bowel are noted although no definitive obstructive changes are seen. These changes are most consistent with a diffuse colitis. There are some areas in the distal transverse colon where non dependent air is noted although this is felt to be related to fecal material within the colon as opposed to true pneumatosis. No portal venous air is seen. Lymphatic: No significant lymphadenopathy is noted. Reproductive: Uterus and bilateral adnexa are unremarkable. Other: No abdominal wall hernia or abnormality. No abdominopelvic ascites. Musculoskeletal: No acute or significant osseous findings. Spinal stimulator is  noted. Review of the MIP images confirms the above findings.  IMPRESSION: Multiple loops of fluid-filled and stool filled colon. This is consistent with the patient's given clinical history of diarrhea. This likely represents a generalized colitis. No evidence of aortic abnormality. Small subpleural nodule on the right as described. No follow-up needed if patient is low-risk. Non-contrast chest CT can be considered in 12 months if patient is high-risk. This recommendation follows the consensus statement: Guidelines for Management of Incidental Pulmonary Nodules Detected on CT Images: From the Fleischner Society 2017; Radiology 2017; 284:228-243. Fatty infiltration of the liver. Electronically Signed   By: Inez Catalina M.D.   On: 04/19/2019 18:16    Chart has been reviewed    Assessment/Plan   44 y.o. female with medical history significant of Lupus, asthma, fibromyalgia, chronic regional pain syndrome, sp spinal cord stimulator, morbid obesity  Admitted for  dehydration, colitis resulting gin SIRS, hypotension   Present on Admission: . Colitis -somewhat unusual in the sudden onset.  In severity.  Resulting in significant abdominal discomfort and hypotension.  Rehydrate blood pressure  and lactic acid improved with rehydration but not back to baseline patient continues to be in significant abdominal pain requiring support.  Keep n.p.o.  IV antibiotics IV Zosyn If not improved may need GI consult Gastric panel ordered CTA showed no evidence of ischemia Patient did not endorse antibiotic exposure In the ER C. difficile has been ordered currently pending . Essential hypertension, benign -hold home medications given  . Chronic pain syndrome-restart pain medications IV if able to tolerate given hypotension  . GERD chronic stable  . Severe obesity with body mass index (BMI) of 35.0 to 39.9 with comorbidity ([place]) Would hold off on phentermine for now appreciate nutritional consult   .  Hyperglycemia -order sliding scale check hemoglobin A1c order diabetes management consult patient may have new diagnosis of diabetes mellitus type 2 given blood glucose above 200   . Sepsis ([place]) -   -Patient meets sepsis criteria with   leukocytosis   Tachycardia   Initial lactic acid Lactic Acid, Venous    Component Value Date/Time   LATICACIDVEN 2.1 ([place]) 04/19/2019 1949   Source most likely:  colitis,    -We will rehydrate, treat with IV antibiotics, follow lactic acid - Await results of blood and urine culture and adjust antibiotics as needed - COVID pending  . Dehydration-rehydrate and follow   . Elevated lactic acid level-continue with aggressive rehydration follow lactic acid   Initial CTA showed no evidence of ischemia   Tobacco abuse spoke about importance of quitting.  Order tobacco cessation protocol   AKI -secondary to dehydration obtain urine electrolytes and rehydrate and follow  Other plan as per orders.  DVT prophylaxis:  SCD   Code Status:  FULL CODE  as per patient   I had personally discussed CODE STATUS with patient    Family Communication:   Family not at  Bedside    Disposition Plan:  To home once workup is complete and patient is stable                      Would benefit from PT/OT eval prior to DC  Ordered                                       Nutrition    consulted  Consults called: none   Admission status:  ED Disposition    None         inpatient     I Expect 2 midnight stay secondary to severity of patient's current illness need for inpatient interventions justified by the following:  hemodynamic instability despite optimal treatment (tachycardia  hypotension  )    Severe lab/radiological/exam abnormalities including:    colitis, luekocytosis and extensive comorbidities including:  /asthma  Morbid Obesity  That are currently affecting medical management.   I expect  patient to be hospitalized  for 2 midnights requiring inpatient medical care.  Patient is at high risk for adverse outcome (such as loss of life or disability) if not treated.  Indication for inpatient stay as follows:   change from baseline regarding mental status Hemodynamic instability despite maximal medical therapy,    severe pain requiring acute inpatient management,  inability to maintain oral hydration     Need for IV antibiotics, IV fluids     Level of care      SDU tele indefinitely please discontinue once patient no longer qualifies   Precautions: admitted as  PUI  Enteric precautions (UV disinfection)  If Covid PCR is negative  - please DC precautions for covid   PPE: Used by the provider:   P100  eye Goggles,  Gloves      Tameia Rafferty 04/19/2019, 9:55 PM    [facility]     after 2 AM please page floor coverage PA If 7AM-7PM, please contact the day team taking care of the patient using [email or web address]   Patient was evaluated in the context of the global COVID-19 pandemic, which necessitated consideration that the patient might be at risk for infection with the SARS-CoV-2 virus that causes COVID-19. Institutional protocols and algorithms that pertain to the evaluation of patients at risk for COVID-19 are in a state of rapid change based on information released by regulatory bodies including the CDC and federal and state organizations. These policies and algorithms were followed during the patient's care.

## 2019-04-19 NOTE — ED Notes (Signed)
Pls contact husband at [phone], he has her cellphone and would like an update.

## 2019-04-19 NOTE — Progress Notes (Signed)
Pharmacy Antibiotic Note  Mary Cruz is a 44 y.o. female admitted on 04/19/2019 with intra-abdominal infection.  Pharmacy has been consulted for Zosyn dosing.  Plan: Zosyn 3.375 g IV given once over 30 minutes, then every 8 hrs by 4-hr infusion   Height: 5\' 4"  (162.6 cm) Weight: 275 lb (124.7 kg) IBW/kg (Calculated) : 54.7  Temp (24hrs), Avg:96 F (35.6 C), Min:96 F (35.6 C), Max:96 F (35.6 C)  Recent Labs  Lab 04/19/19 1729 04/19/19 1816  WBC  --  19.0*  CREATININE 1.77*  --   LATICACIDVEN 3.1*  --     Estimated Creatinine Clearance: 53.5 mL/min (A) (by C-G formula based on SCr of 1.77 mg/dL (H)).    Allergies  Allergen Reactions  . Gabapentin Swelling  . Celebrex [Celecoxib] Swelling    "Body swelling"  . Pentazocine Lactate Itching and Other (See Comments)    HALLUCINATIONS  . Seroquel [Quetiapine] Other (See Comments)    Falls  . Amoxicillin Diarrhea and Nausea And Vomiting  . Bactrim [Sulfamethoxazole-Trimethoprim] Other (See Comments)    "Makes very sick"  . Doxycycline Other (See Comments)    "SICKNESS"  . Erythromycin Other (See Comments)    RESULTANT UTI  . Hydromorphone Hcl Other (See Comments)    HALLUCINATIONS  . Keflex [Cephalexin] Other (See Comments)    Nausea and causes uti's  . Latex Itching  . Lyrica [Pregabalin] Other (See Comments)    "Too sleepy"  . Nabumetone Itching   Thank you for allowing pharmacy to be a part of this patient's care.  Sueellen Kayes A 04/19/2019 8:45 PM

## 2019-04-20 ENCOUNTER — Other Ambulatory Visit: Payer: Self-pay

## 2019-04-20 LAB — CBC
HCT: 40.3 % (ref 36.0–46.0)
Hemoglobin: 12.6 g/dL (ref 12.0–15.0)
MCH: 29 pg (ref 26.0–34.0)
MCHC: 31.3 g/dL (ref 30.0–36.0)
MCV: 92.9 fL (ref 80.0–100.0)
Platelets: 257 10*3/uL (ref 150–400)
RBC: 4.34 MIL/uL (ref 3.87–5.11)
RDW: 14.5 % (ref 11.5–15.5)
WBC: 25.8 10*3/uL — ABNORMAL HIGH (ref 4.0–10.5)
nRBC: 0 % (ref 0.0–0.2)

## 2019-04-20 LAB — GLUCOSE, CAPILLARY
Glucose-Capillary: 118 mg/dL — ABNORMAL HIGH (ref 70–99)
Glucose-Capillary: 118 mg/dL — ABNORMAL HIGH (ref 70–99)
Glucose-Capillary: 115 mg/dL — ABNORMAL HIGH (ref 70–99)
Glucose-Capillary: 133 mg/dL — ABNORMAL HIGH (ref 70–99)
Glucose-Capillary: 125 mg/dL — ABNORMAL HIGH (ref 70–99)
Glucose-Capillary: 105 mg/dL — ABNORMAL HIGH (ref 70–99)

## 2019-04-20 LAB — SARS CORONAVIRUS 2 (TAT 6-24 HRS): SARS Coronavirus 2: NEGATIVE

## 2019-04-20 LAB — COMPREHENSIVE METABOLIC PANEL
ALT: 19 U/L (ref 0–44)
AST: 20 U/L (ref 15–41)
Albumin: 2.7 g/dL — ABNORMAL LOW (ref 3.5–5.0)
Alkaline Phosphatase: 88 U/L (ref 38–126)
Anion gap: 11 (ref 5–15)
BUN: 17 mg/dL (ref 6–20)
CO2: 19 mmol/L — ABNORMAL LOW (ref 22–32)
Calcium: 7.6 mg/dL — ABNORMAL LOW (ref 8.9–10.3)
Chloride: 110 mmol/L (ref 98–111)
Creatinine, Ser: 1.17 mg/dL — ABNORMAL HIGH (ref 0.44–1.00)
GFR calc Af Amer: >60 mL/min (ref >60)
GFR calc non Af Amer: 57 mL/min — ABNORMAL LOW (ref >60)
Glucose, Bld: 124 mg/dL — ABNORMAL HIGH (ref 70–99)
Potassium: 3.5 mmol/L (ref 3.5–5.1)
Sodium: 140 mmol/L (ref 135–145)
Total Bilirubin: 0.9 mg/dL (ref 0.3–1.2)
Total Protein: 5.4 g/dL — ABNORMAL LOW (ref 6.5–8.1)

## 2019-04-20 LAB — TSH: TSH: 0.623 u[IU]/mL (ref 0.350–4.500)

## 2019-04-20 LAB — LACTIC ACID, PLASMA
Lactic Acid, Venous: 1.8 mmol/L (ref 0.5–1.9)
Lactic Acid, Venous: 3.3 mmol/L (ref 0.5–1.9)

## 2019-04-20 LAB — HEMOGLOBIN A1C
Hgb A1c MFr Bld: 5.7 % — ABNORMAL HIGH (ref 4.8–5.6)
Mean Plasma Glucose: 116.89 mg/dL

## 2019-04-20 LAB — C DIFFICILE QUICK SCREEN W PCR REFLEX
C Diff antigen: NEGATIVE
C Diff interpretation: NOT DETECTED
C Diff toxin: NEGATIVE

## 2019-04-20 LAB — PHOSPHORUS: Phosphorus: 3.2 mg/dL (ref 2.5–4.6)

## 2019-04-20 LAB — MAGNESIUM: Magnesium: 2 mg/dL (ref 1.7–2.4)

## 2019-04-20 LAB — HIV ANTIBODY (ROUTINE TESTING W REFLEX): HIV Screen 4th Generation wRfx: NONREACTIVE

## 2019-04-20 MED ORDER — PRO-STAT SUGAR FREE PO LIQD
30.0000 mL | Freq: Two times a day (BID) | ORAL | Status: DC
  Administered 2019-04-20 – 2019-04-21 (×3): 30 mL via ORAL
  Filled 2019-04-20 (×3): qty 30

## 2019-04-20 MED ORDER — PROCHLORPERAZINE EDISYLATE 10 MG/2ML IJ SOLN
10.0000 mg | Freq: Four times a day (QID) | INTRAMUSCULAR | Status: AC | PRN
  Administered 2019-04-20 (×2): 10 mg via INTRAVENOUS
  Filled 2019-04-20 (×2): qty 2

## 2019-04-20 MED ORDER — FENTANYL CITRATE (PF) 100 MCG/2ML IJ SOLN
25.0000 ug | Freq: Once | INTRAMUSCULAR | Status: AC
  Administered 2019-04-20: 25 ug via INTRAVENOUS
  Filled 2019-04-20: qty 2

## 2019-04-20 MED ORDER — ADULT MULTIVITAMIN W/MINERALS CH
1.0000 | ORAL_TABLET | Freq: Every day | ORAL | Status: DC
  Administered 2019-04-20 – 2019-04-21 (×2): 1 via ORAL
  Filled 2019-04-20 (×2): qty 1

## 2019-04-20 MED ORDER — ENSURE MAX PROTEIN PO LIQD
11.0000 [oz_av] | Freq: Every day | ORAL | Status: DC
  Administered 2019-04-20: 11 [oz_av] via ORAL

## 2019-04-20 NOTE — Progress Notes (Signed)
Initial Nutrition Assessment  DOCUMENTATION CODES:   Morbid obesity  INTERVENTION:  - will order Ensure Max once/day, each supplement provides 150 kcal and 30 grams protein. - will Will order 30 mL Prostat BID, each supplement provides 100 kcal and 15 grams of protein. - will order daily multivitamin with minerals.    NUTRITION DIAGNOSIS:   Inadequate oral intake related to acute illness as evidenced by per patient/family report.  GOAL:   Patient will meet greater than or equal to 90% of their needs  MONITOR:   PO intake, Supplement acceptance, Labs, Weight trends  REASON FOR ASSESSMENT:   Consult Malnutrition Eval  ASSESSMENT:   44 y.o. female with medical history significant of lupus, asthma, fibromyalgia, chronic regional pain syndrome, s/p spinal cord stimulator, and morbid obesity. She presented to the ED due to abdominal pain. She was recently started on buproprion and phentermine.  Diet advanced from NPO to CLD today at 0853. She has had ~50% of meals since that time. She reported good appetite until the day PTA, when she was only able to eat an ice cream sandwich and a handful of pecans. Weight yesterday was documented as 275 lb and this is +22 lb compared to weight on 02/25/19.   RD consult due to BMI. Not appropriate during this inpatient admission given medical course and not eating well. Outpatient RD consult would be more appropriate and beneficial to patient in the long-term.   Per notes: - colitis--gastric panel ordered - chronic pain syndrome - sepsis - dehydration - AKI   Labs reviewed; CBGs: 118, 118, and 115 mg/dl, creatinine: 1.17 mg/dl, Ca: 7.6 mg/dl, GFR: 57 ml/min. Medications reviewed; sliding scale novolog.     NUTRITION - FOCUSED PHYSICAL EXAM:  completed; no muscle and no fat wasting, mild edema to BLE.   Diet Order:   Diet Order            Diet clear liquid Room service appropriate? Yes; Fluid consistency: Thin  Diet effective now               EDUCATION NEEDS:   No education needs have been identified at this time  Skin:  Skin Assessment: Reviewed RN Assessment  Last BM:  3/9  Height:   Ht Readings from Last 1 Encounters:  04/19/19 5\' 4"  (1.626 m)    Weight:   Wt Readings from Last 1 Encounters:  04/19/19 124.7 kg    Ideal Body Weight:  54.54 kg  BMI:  Body mass index is 47.2 kg/m.  Estimated Nutritional Needs:   Kcal:  2000-2200 kcal  Protein:  105-115 grams  Fluid:  >/= 2.2 L/day     Jarome Matin, MS, RD, LDN, CNSC Inpatient Clinical Dietitian RD pager # available in [facility]  After hours/weekend pager # available in [facility]

## 2019-04-20 NOTE — Progress Notes (Signed)
PROGRESS NOTE    Mary Cruz    Code Status: Full Code  [id number] DOB: 01/26/1976 DOA: 04/19/2019 LOS: 1 days  PCP: Emeterio Reeve, DO CC:  Chief Complaint  Patient presents with  . Abdominal Pain  . Altered Mental Status       Hospital Summary   Is a 44 year old female with history of lupus, asthma, fibromyalgia, IBS, CRPS status post spinal cord stimulator, morbid obesity who presented from [place] office after patient was found by her husband in the bathroom on the floor with severe abdominal pain.  Of note, recently started Wellbutrin which she took 1 pill and phentermine which she has not taken yet.  She was admitted to stepdown unit for evaluation of colitis.  CTA showed no evidence of ischemia.  Started on IV Zosyn.  A & P   Active Problems:   GERD   Essential hypertension, benign   Chronic pain syndrome   Severe obesity with body mass index (BMI) of 35.0 to 39.9 with comorbidity (HCC)   Opiate dependence (HCC)   Presence of functional implant ([employer] Lumbar spinal cord stimulator implant)   Colitis   Hyperglycemia   Sepsis (HCC)   Dehydration   Elevated lactic acid level   Tobacco abuse   1. Generalized colitis of unknown etiology a. Improved today symptomatically b. History of colonoscopy in 2003 to rule out IBD which was unremarkable and was concerning more for severe IBS with visceral hypersensitivity and was on a high-fiber diet and antispasmodics at that time c. Sepsis ruled out, more likely SIRS which has resolved d. Afebrile, leukocytosis increased 19->25, lactic acid 3.3-> 1.8 e. C. difficile negative f. GI pathogen panel pending g. Continue Zosyn h. Continue Norco every 4 hours as needed i. Slowly advance diet to clear liquids j. If not improved consider GI eval 2. Hypertension a. Home lisinopril-hydrochlorothiazide on hold due to elevated creatinine 3. AKI a. Creatinine 1.77, previously 1.0 September 2020 b. Hold ACE/HCTZ, follow-up in  a.m. 4. Obesity a. Hold off on phentermine during this acute phase 5. Tobacco abuse a. Tobacco cessation advised b. Wellbutrin can likely help with this over the long-term 6. GERD 7. Chronic pain syndrome a. Continue Norco  DVT prophylaxis: SCD Family Communication: Patient's husband has been updated  Disposition Plan: Transfer out of stepdown unit today  Patient came from:   Home                                                                                          Anticipated d/c place: Home  Barriers to d/c: Improved pain, needs to tolerate p.o. intake  Pressure injury documentation    None  Consultants  None  Procedures  None  Antibiotics   Anti-infectives (From admission, onward)   Start     Dose/Rate Route Frequency Ordered Stop   04/20/19 0600  piperacillin-tazobactam (ZOSYN) IVPB 3.375 g     3.375 g 12.5 mL/hr over 240 Minutes Intravenous Every 8 hours 04/19/19 2044     04/20/19 0000  piperacillin-tazobactam (ZOSYN) IVPB 3.375 g     3.375 g 12.5 mL/hr over 240 Minutes Intravenous  Once 04/19/19  2044 04/20/19 0332   04/19/19 1830  vancomycin (VANCOCIN) IVPB 1000 mg/200 mL premix     1,000 mg 200 mL/hr over 60 Minutes Intravenous  Once 04/19/19 1824 04/19/19 2008   04/19/19 1830  piperacillin-tazobactam (ZOSYN) IVPB 3.375 g     3.375 g 12.5 mL/hr over 240 Minutes Intravenous  Once 04/19/19 1824 04/19/19 2304        Subjective   Seen and examined at bedside no acute distress lying flat resting comfortably.  Admits her pain has improved somewhat today.  States she does have a diet and is willing to try clear liquid diet for lunch.  Denies family or personal history of colon cancer or IBD.   history of colonoscopy at a young age  Objective   Vitals:   04/20/19 0800 04/20/19 0900 04/20/19 1049 04/20/19 1200  BP: (!) 119/59 (!) 119/58  (!) 149/76  Pulse: 96 88  88  Resp: 12 12 13 12   Temp: 98.7 F (37.1 C)     TempSrc: Oral     SpO2: 92% 94%  93%   Weight:      Height:        Intake/Output Summary (Last 24 hours) at 04/20/2019 1649 Last data filed at 04/20/2019 1500 Gross per 24 hour  Intake 1548.1 ml  Output -  Net 1548.1 ml   Filed Weights   04/19/19 1725  Weight: 124.7 kg    Examination:  Physical Exam Vitals and nursing note reviewed.  Constitutional:      Appearance: Normal appearance. She is obese.  HENT:     Head: Normocephalic and atraumatic.  Eyes:     Conjunctiva/sclera: Conjunctivae normal.  Cardiovascular:     Rate and Rhythm: Normal rate and regular rhythm.  Pulmonary:     Effort: Pulmonary effort is normal.     Breath sounds: Normal breath sounds.  Abdominal:     General: Abdomen is flat.     Tenderness: There is generalized abdominal tenderness.  Musculoskeletal:        General: No swelling or tenderness.  Skin:    Coloration: Skin is not jaundiced or pale.  Neurological:     Mental Status: She is alert. Mental status is at baseline.  Psychiatric:        Mood and Affect: Mood normal.        Behavior: Behavior normal.     Data Reviewed: I have personally reviewed following labs and imaging studies  CBC: Recent Labs  Lab 04/19/19 1816 04/20/19 0605  WBC 19.0* 25.8*  NEUTROABS 15.4*  --   HGB 14.6 12.6  HCT 47.3* 40.3  MCV 92.4 92.9  PLT 370 [id number]   Basic Metabolic Panel: Recent Labs  Lab 04/19/19 1729 04/19/19 1949 04/19/19 1955 04/20/19 0251  NA 135  --   --  140  K 4.1  --   --  3.5  CL 96*  --   --  110  CO2 25  --   --  19*  GLUCOSE 209*  --   --  124*  BUN 19  --   --  17  CREATININE 1.77*  --   --  1.17*  CALCIUM 9.8  --   --  7.6*  MG  --  2.1  --  2.0  PHOS  --   --  3.4 3.2   GFR: Estimated Creatinine Clearance: 80.9 mL/min (A) (by C-G formula based on SCr of 1.17 mg/dL (H)). Liver Function Tests: Recent Labs  Lab  04/19/19 1729 04/20/19 0251  AST 25 20  ALT 26 19  ALKPHOS 122 88  BILITOT 1.1 0.9  PROT 8.5* 5.4*  ALBUMIN 4.3 2.7*   Recent Labs  Lab  04/19/19 1729  LIPASE 23   No results for input(s): AMMONIA in the last 168 hours. Coagulation Profile: No results for input(s): INR, PROTIME in the last 168 hours. Cardiac Enzymes: Recent Labs  Lab 04/19/19 1955  CKTOTAL 68   BNP (last 3 results) No results for input(s): PROBNP in the last 8760 hours. HbA1C: Recent Labs    04/20/19 0251  HGBA1C 5.7*   CBG: Recent Labs  Lab 04/19/19 1720 04/20/19 0308 04/20/19 0747 04/20/19 1150  GLUCAP 167* 118* 118* 115*   Lipid Profile: No results for input(s): CHOL, HDL, LDLCALC, TRIG, CHOLHDL, LDLDIRECT in the last 72 hours. Thyroid Function Tests: Recent Labs    04/20/19 0251  TSH 0.623   Anemia Panel: No results for input(s): VITAMINB12, FOLATE, FERRITIN, TIBC, IRON, RETICCTPCT in the last 72 hours. Sepsis Labs: Recent Labs  Lab 04/19/19 1729 04/19/19 1949 04/19/19 2032 04/19/19 2333 04/20/19 0251  PROCALCITON  --   --  <0.10  --   --   LATICACIDVEN 3.1* 2.1*  --  3.3* 1.8    Recent Results (from the past 240 hour(s))  Culture, blood (routine x 2)     Status: None (Preliminary result)   Collection Time: 04/19/19  5:28 PM   Specimen: Site Not Specified; Blood  Result Value Ref Range Status   Specimen Description   Final    SITE NOT SPECIFIED Performed at [facility] [address], [place], [place] [address]    Special Requests   Final    BOTTLES DRAWN AEROBIC ONLY Blood Culture adequate volume Performed at [facility] [address], [place], [place] [address]    Culture   Final    NO GROWTH < 24 HOURS Performed at [facility] [address], [place], [place] [address]    Report Status PENDING  Incomplete  Culture, blood (routine x 2)     Status: None (Preliminary result)   Collection Time: 04/19/19  6:29 PM   Specimen: BLOOD  Result Value Ref Range Status   Specimen Description   Final    BLOOD RIGHT ANTECUBITAL Performed at [facility] [address], [place], [place] [address]    Special Requests   Final    BOTTLES DRAWN AEROBIC AND ANAEROBIC Blood Culture adequate volume Performed at [facility] [address], [place], [place] [address]    Culture   Final    NO GROWTH < 24 HOURS Performed at [facility] [address] [place], [place] [address]    Report Status PENDING  Incomplete  SARS CORONAVIRUS 2 (TAT 6-24 HRS) Nasopharyngeal Urine, Random     Status: None   Collection Time: 04/19/19  7:49 PM   Specimen: Urine, Random; Nasopharyngeal  Result Value Ref Range Status   SARS Coronavirus 2 NEGATIVE NEGATIVE Final    Comment: (NOTE) SARS-CoV-2 target nucleic acids are NOT DETECTED. The SARS-CoV-2 RNA is generally detectable in upper and lower respiratory specimens during the acute phase of infection. Negative results do not preclude SARS-CoV-2 infection, do not rule out co-infections with other pathogens, and should not be used as the sole basis for treatment or other patient management decisions. Negative results must be combined with clinical observations, patient history, and epidemiological information.  The expected result is Negative. Fact Sheet for Patients: SugarRoll.be Fact Sheet for Healthcare Providers: https://www.woods-mathews.com/ This test is not yet approved or cleared by the [place] FDA and  has been authorized for detection and/or diagnosis of SARS-CoV-2 by FDA under an Emergency Use Authorization (EUA). This EUA will remain  in effect (meaning this test can be used) for the duration of the COVID-19 declaration under Section 56 4(b)(1) of the Act, 21 U.S.C. section 360bbb-3(b)(1), unless the authorization is terminated or revoked sooner. Performed at [facility], [place] [address] [place], [place] [address]   C difficile quick scan w PCR reflex     Status: None   Collection Time: 04/19/19  7:49 PM    Specimen: STOOL  Result Value Ref Range Status   C Diff antigen NEGATIVE NEGATIVE Final   C Diff toxin NEGATIVE NEGATIVE Final   C Diff interpretation No C. difficile detected.  Final    Comment: Performed at [facility], [place] [address], [place], [place] [address]  MRSA PCR Screening     Status: None   Collection Time: 04/19/19 10:04 PM   Specimen: Nasal Mucosa; Nasopharyngeal  Result Value Ref Range Status   MRSA by PCR NEGATIVE NEGATIVE Final    Comment:        The GeneXpert MRSA Assay (FDA approved for NASAL specimens only), is one component of a comprehensive MRSA colonization surveillance program. It is not intended to diagnose MRSA infection nor to guide or monitor treatment for MRSA infections. Performed at [facility], [place] [address] [place],  [address]          Radiology Studies: CT Angio Chest/Abd/Pel for Dissection W and/or Wo Contrast  Result Date: 04/19/2019 CLINICAL DATA:  Chest and abdominal pain EXAM: CT ANGIOGRAPHY CHEST, ABDOMEN AND PELVIS TECHNIQUE: Multidetector CT imaging through the chest, abdomen and pelvis was performed using the standard protocol during bolus administration of intravenous contrast. Multiplanar reconstructed images and MIPs were obtained and reviewed to evaluate the vascular anatomy. CONTRAST:  122mL OMNIPAQUE IOHEXOL 350 MG/ML SOLN COMPARISON:  07/22/2016 FINDINGS: CTA CHEST FINDINGS Cardiovascular: Initial precontrast images show no hyperdense crescent to suggest acute aortic abnormality. Post-contrast images demonstrate the thoracic aorta to have a normal branching pattern. No evidence of aneurysmal dilatation or dissection is seen. No cardiac enlargement is seen. No coronary calcifications are noted. The pulmonary artery shows no large central embolus although not timed for embolus evaluation. Mediastinum/Nodes: Thoracic inlet is within normal limits. No hilar or mediastinal adenopathy is  noted. The esophagus shows evidence of a sliding-type hiatal hernia distally. Lungs/Pleura: Lungs are well aerated bilaterally. No focal infiltrate or sizable effusion is seen. Mild atelectatic changes are noted in the left lung base. A subpleural parenchymal nodule is noted in the right lower lobe best seen on image number 69 of series 6. This measures approximately 4 mm in greatest dimension. No other sizable nodules are seen. Musculoskeletal: Multiple old rib fractures are noted with healing on the right. No acute rib abnormality is seen. No compression deformity is noted. Review of the MIP images confirms the above findings. CTA ABDOMEN AND PELVIS FINDINGS VASCULAR Aorta: Abdominal aorta shows no aneurysmal dilatation or dissection. Celiac: Patent without evidence of aneurysm, dissection, vasculitis or significant stenosis. SMA: Patent without evidence of aneurysm, dissection, vasculitis or significant stenosis. Renals: Both renal arteries are patent without evidence of aneurysm, dissection, vasculitis, fibromuscular dysplasia or significant stenosis. IMA: Patent without evidence of aneurysm, dissection, vasculitis  or significant stenosis. Iliacs: Patent without evidence of aneurysm, dissection, vasculitis or significant stenosis. Veins: No obvious venous abnormality within the limitations of this arterial phase study. Review of the MIP images confirms the above findings. NON-VASCULAR Hepatobiliary: Liver is diffusely decreased in attenuation consistent with fatty infiltration. The gallbladder has been surgically removed. Pancreas: Unremarkable. No pancreatic ductal dilatation or surrounding inflammatory changes. Spleen: Normal in size without focal abnormality. Adrenals/Urinary Tract: Adrenal glands are within normal limits. Kidneys demonstrate a normal enhancement pattern bilaterally. No renal calculi or urinary tract obstructive changes are seen. Scarring is noted in the upper pole of the left kidney stable  from a prior exam of 2018. ureters are within normal limits. The bladder is decompressed. Stomach/Bowel: Colon is well distended with fluid and fecal material consistent with the patient's given clinical history of diarrhea. Some fluid-filled loops of small bowel are noted although no definitive obstructive changes are seen. These changes are most consistent with a diffuse colitis. There are some areas in the distal transverse colon where non dependent air is noted although this is felt to be related to fecal material within the colon as opposed to true pneumatosis. No portal venous air is seen. Lymphatic: No significant lymphadenopathy is noted. Reproductive: Uterus and bilateral adnexa are unremarkable. Other: No abdominal wall hernia or abnormality. No abdominopelvic ascites. Musculoskeletal: No acute or significant osseous findings. Spinal stimulator is noted. Review of the MIP images confirms the above findings. IMPRESSION: Multiple loops of fluid-filled and stool filled colon. This is consistent with the patient's given clinical history of diarrhea. This likely represents a generalized colitis. No evidence of aortic abnormality. Small subpleural nodule on the right as described. No follow-up needed if patient is low-risk. Non-contrast chest CT can be considered in 12 months if patient is high-risk. This recommendation follows the consensus statement: Guidelines for Management of Incidental Pulmonary Nodules Detected on CT Images: From the Fleischner Society 2017; Radiology 2017; 284:228-243. Fatty infiltration of the liver. Electronically Signed   By: Inez Catalina M.D.   On: 04/19/2019 18:16        Scheduled Meds: . Chlorhexidine Gluconate Cloth  6 each Topical Daily  . feeding supplement (PRO-STAT SUGAR FREE 64)  30 mL Oral BID  . insulin aspart  0-15 Units Subcutaneous Q4H  . multivitamin with minerals  1 tablet Oral Daily  . Ensure Max Protein  11 oz Oral Daily   Continuous Infusions: .  piperacillin-tazobactam (ZOSYN)  IV 12.5 mL/hr at 04/20/19 1500  . sodium chloride       Time spent: 28 minutes with over 50% of the time coordinating the patient's care    Harold Hedge, DO [facility] Pager [phone]  Call night coverage person covering after 7pm

## 2019-04-20 NOTE — Progress Notes (Signed)
Occupational Therapy Evaluation  Clinical Impression Patient verbalized living at home with spouse and performing all self-care tasks and mobility with Independence at [facility]. Overall patient required Modified Independence to set-up/Supervision assistance with self-care tasks and functional mobility. Patient was educated on safety techniques when mobilizing around room and to bathroom. No LOB episodes noted. Patient will benefit from continued skilled acute OT to advance to Modified Independence level.     04/20/19 1623  OT Visit Information  Assistance Needed +1  PT/OT/SLP Co-Evaluation/Treatment Yes  Reason for Co-Treatment For patient/therapist safety;To address functional/ADL transfers  PT goals addressed during session Mobility/safety with mobility;Balance  OT goals addressed during session ADL's and self-care  History of Present Illness Patient is a 44 year old female with past medical history of hypertension, fibromyalgia, chronic pain syndrome, irritable bowel, and bronchitis.  She presents today by private auto for evaluation of severe abdominal pain  Precautions  Precautions Fall  Restrictions  Weight Bearing Restrictions No  Home Living  Family/patient expects to be discharged to: Private residence  Living Arrangements Spouse/significant other  Available Help at Discharge Family  Type of [place] to enter  Entrance Stairs-Number of Steps 2  [place] One level  Bathroom Programmer, applications  Prior Function  Level of Independence Independent  Communication  Communication No difficulties  Pain Assessment  Pain Assessment 0-10  Pain Score 2  Faces Pain Scale 2  Pain Location lower abdomen  Pain Descriptors / Indicators Aching  Cognition  Arousal/Alertness Awake/alert  Behavior During Therapy WFL for tasks assessed/performed  Overall Cognitive Status Within Functional Limits for tasks assessed  Upper Extremity  Assessment  Upper Extremity Assessment Overall WFL for tasks assessed  Lower Extremity Assessment  Lower Extremity Assessment Defer to PT evaluation  ADL  Overall ADL's  Needs assistance/impaired  Eating/Feeding Modified independent  Grooming Oral care;Wash/dry face;Wash/dry hands;Brushing hair;Supervision/safety;Set up  Upper Body Bathing Set up  Lower Body Bathing Min guard  Upper Body Dressing  Set up  Lower Body Dressing Min guard  Toilet Transfer Supervision/safety  Toileting- Clothing Manipulation and Hygiene Supervision/safety  Tub/ Shower Transfer Supervision/safety  Functional mobility during ADLs Supervision/safety  Vision- History  Baseline Vision/History No visual deficits  Bed Mobility  Overal bed mobility Needs Assistance  Bed Mobility Supine to Sit  Supine to sit Supervision  Transfers  Overall transfer level Needs assistance  Equipment used None  Transfers Sit to/from Stand;Stand Pivot Transfers  Sit to Stand Supervision  Stand pivot transfers Supervision  Balance  Overall balance assessment Mild deficits observed, not formally tested  OT - End of Session  Activity Tolerance Patient tolerated treatment well  Patient left in chair;with family/visitor present;with call bell/phone within reach  Nurse Communication Mobility status  OT Assessment  OT Recommendation/Assessment Patient needs continued OT Services  OT Visit Diagnosis Unsteadiness on feet (R26.81);Muscle weakness (generalized) (M62.81)  OT Problem List Decreased strength;Impaired balance (sitting and/or standing);Decreased activity tolerance  OT Plan  OT Frequency (ACUTE ONLY) Min 2X/week  OT Treatment/Interventions (ACUTE ONLY) Self-care/ADL training;Therapeutic exercise;Energy conservation;Therapeutic activities;Patient/family education;Balance training  AM-PAC OT "6 Clicks" Daily Activity Outcome Measure (Version 2)  Help from another person eating meals? 4  Help from another person taking care of  personal grooming? 3  Help from another person toileting, which includes using toliet, bedpan, or urinal? 3  Help from another person bathing (including washing, rinsing, drying)? 3  Help from another person to put on and taking off regular upper body  clothing? 3  Help from another person to put on and taking off regular lower body clothing? 3  6 Click Score 19  OT Recommendation  Follow Up Recommendations No OT follow up  OT Equipment None recommended by OT  Individuals Consulted  Consulted and Agree with Results and Recommendations Patient  Acute Rehab OT Goals  Patient Stated Goal to go home  OT Goal Formulation With patient  Time For Goal Achievement 05/04/19  Potential to Achieve Goals Good  OT Time Calculation  OT Start Time (ACUTE ONLY) 1210  OT Stop Time (ACUTE ONLY) 1234  OT Time Calculation (min) 24 min  OT General Charges  $OT Visit 1 Visit  OT Evaluation  $OT Eval Low Complexity 1 Low  OT Treatments  $Self Care/Home Management  8-22 mins  Written Expression  Dominant Hand Right   Morningstar Toft OTR/L

## 2019-04-20 NOTE — Evaluation (Signed)
Physical Therapy Evaluation Patient Details Name: Mary Cruz MRN: [id number] DOB: Oct 15, 1975 Today's Date: 04/20/2019   History of Present Illness  Patient is a 44 year old female with past medical history of hypertension, fibromyalgia, chronic pain syndrome, irritable bowel, and bronchitis.  She presents today by private auto for evaluation of severe abdominal pain  Clinical Impression  The patient presents with generalized weakness, mild dizziness complaints. patient ambulated in room. Patient should progress to DC home. Spouse  Is present. Pt admitted with above diagnosis.  Pt currently with functional limitations due to the deficits listed below (see PT Problem List). Pt will benefit from skilled PT to increase their independence and safety with mobility to allow discharge to the venue listed below.        Follow Up Recommendations No PT follow up    Equipment Recommendations  None recommended by PT    Recommendations for Other Services       Precautions / Restrictions Precautions Precautions: Fall      Mobility  Bed Mobility Overal bed mobility: Needs Assistance Bed Mobility: Supine to Sit     Supine to sit: Supervision        Transfers   Equipment used: None                Ambulation/Gait Ambulation/Gait assistance: Min guard Gait Distance (Feet): 50 Feet Assistive device: None       General Gait Details: gentle  steady assist inituially, at times held onto objects  Stairs            Wheelchair Mobility    Modified Rankin (Stroke Patients Only)       Balance Overall balance assessment: Mild deficits observed, not formally tested                                           Pertinent Vitals/Pain Pain Assessment: Faces Faces Pain Scale: Hurts a little bit Pain Location: lower abdomen Pain Descriptors / Indicators: Aching Pain Intervention(s): Monitored during session;Premedicated before session    Home Living  Family/patient expects to be discharged to:: Private residence Living Arrangements: Spouse/significant other Available Help at Discharge: Family Type of Home: House Home Access: Stairs to enter   Technical brewer of Steps: 2 Home Layout: One level Home Equipment: None      Prior Function Level of Independence: Independent               Hand Dominance        Extremity/Trunk Assessment        Lower Extremity Assessment Lower Extremity Assessment: Generalized weakness    Cervical / Trunk Assessment Cervical / Trunk Assessment: Normal  Communication   Communication: No difficulties  Cognition Arousal/Alertness: Awake/alert Behavior During Therapy: WFL for tasks assessed/performed Overall Cognitive Status: Within Functional Limits for tasks assessed                                        General Comments      Exercises     Assessment/Plan    PT Assessment Patient needs continued PT services  PT Problem List Decreased strength;Decreased activity tolerance;Decreased mobility;Decreased balance;Pain;Decreased knowledge of precautions       PT Treatment Interventions Gait training;Functional mobility training;Therapeutic exercise;Therapeutic activities    PT Goals (Current goals can be found  in the Care Plan section)  Acute Rehab PT Goals Patient Stated Goal: to go home PT Goal Formulation: With patient/family Time For Goal Achievement: 05/04/19 Potential to Achieve Goals: Good    Frequency Min 3X/week   Barriers to discharge        Co-evaluation               AM-PAC PT "6 Clicks" Mobility  Outcome Measure Help needed turning from your back to your side while in a flat bed without using bedrails?: None Help needed moving from lying on your back to sitting on the side of a flat bed without using bedrails?: None Help needed moving to and from a bed to a chair (including a wheelchair)?: A Little Help needed standing up from  a chair using your arms (e.g., wheelchair or bedside chair)?: A Little Help needed to walk in hospital room?: A Little Help needed climbing 3-5 steps with a railing? : A Little 6 Click Score: 20    End of Session   Activity Tolerance: Patient tolerated treatment well Patient left: (in BR with OT supervising) Nurse Communication: Mobility status PT Visit Diagnosis: Unsteadiness on feet (R26.81)    Time: 1210-1226 PT Time Calculation (min) (ACUTE ONLY): 16 min   Charges:   PT Evaluation $PT Eval Low Complexity: [place] Pager [phone] Office [phone]   Claretha Cooper 04/20/2019, 1:32 PM

## 2019-04-20 NOTE — Plan of Care (Signed)
Patient resting in bed, made aware of possible transfer to Med-surg unit and called and updated husband. Will continue to monitor.  Problem: Education: Goal: Knowledge of General Education information will improve Description: Including pain rating scale, medication(s)/side effects and non-pharmacologic comfort measures Outcome: Progressing   Problem: Health Behavior/Discharge Planning: Goal: Ability to manage health-related needs will improve Outcome: Progressing   Problem: Clinical Measurements: Goal: Ability to maintain clinical measurements within normal limits will improve Outcome: Progressing Goal: Will remain free from infection Outcome: Progressing Goal: Diagnostic test results will improve Outcome: Progressing Goal: Respiratory complications will improve Outcome: Progressing Goal: Cardiovascular complication will be avoided Outcome: Progressing   Problem: Activity: Goal: Risk for activity intolerance will decrease Outcome: Progressing   Problem: Nutrition: Goal: Adequate nutrition will be maintained Outcome: Progressing   Problem: Coping: Goal: Level of anxiety will decrease Outcome: Progressing   Problem: Elimination: Goal: Will not experience complications related to bowel motility Outcome: Progressing Goal: Will not experience complications related to urinary retention Outcome: Progressing   Problem: Pain Managment: Goal: General experience of comfort will improve Outcome: Progressing   Problem: Safety: Goal: Ability to remain free from injury will improve Outcome: Progressing   Problem: Skin Integrity: Goal: Risk for impaired skin integrity will decrease Outcome: Progressing

## 2019-04-20 NOTE — Progress Notes (Signed)
Inpatient Diabetes Program Recommendations  AACE/ADA: New Consensus Statement on Inpatient Glycemic Control (2015)  Target Ranges:  Prepandial:   less than 140 mg/dL      Peak postprandial:   less than 180 mg/dL (1-2 hours)      Critically ill patients:  140 - 180 mg/dL   Lab Results  Component Value Date   GLUCAP 118 (H) 04/20/2019   HGBA1C 5.7 (H) 04/20/2019    Review of Glycemic Control Results for Mary Cruz, Mary Cruz (MRN [id number]) as of 04/20/2019 11:02  Ref. Range 04/20/2019 03:08 04/20/2019 07:47  Glucose-Capillary Latest Ref Range: 70 - 99 mg/dL 118 (H) 118 (H)   Inpatient Diabetes Program Recommendations:    Note elevated glucose on admit. A1C does not indicate DM and CBG's <120 mg/dL.  Will sign off referral for now.   Thanks  Adah Perl, RN, BC-ADM Inpatient Diabetes Coordinator Pager [phone] (8a-5p)

## 2019-04-21 LAB — CBC
HCT: 35.6 % — ABNORMAL LOW (ref 36.0–46.0)
Hemoglobin: 11 g/dL — ABNORMAL LOW (ref 12.0–15.0)
MCH: 29 pg (ref 26.0–34.0)
MCHC: 30.9 g/dL (ref 30.0–36.0)
MCV: 93.9 fL (ref 80.0–100.0)
Platelets: 246 10*3/uL (ref 150–400)
RBC: 3.79 MIL/uL — ABNORMAL LOW (ref 3.87–5.11)
RDW: 14.3 % (ref 11.5–15.5)
WBC: 15.5 10*3/uL — ABNORMAL HIGH (ref 4.0–10.5)
nRBC: 0 % (ref 0.0–0.2)

## 2019-04-21 LAB — GLUCOSE, CAPILLARY
Glucose-Capillary: 92 mg/dL (ref 70–99)
Glucose-Capillary: 99 mg/dL (ref 70–99)
Glucose-Capillary: 114 mg/dL — ABNORMAL HIGH (ref 70–99)
Glucose-Capillary: 122 mg/dL — ABNORMAL HIGH (ref 70–99)

## 2019-04-21 LAB — BASIC METABOLIC PANEL
Anion gap: 7 (ref 5–15)
BUN: 13 mg/dL (ref 6–20)
CO2: 26 mmol/L (ref 22–32)
Calcium: 8.4 mg/dL — ABNORMAL LOW (ref 8.9–10.3)
Chloride: 104 mmol/L (ref 98–111)
Creatinine, Ser: 0.87 mg/dL (ref 0.44–1.00)
GFR calc Af Amer: >60 mL/min (ref >60)
GFR calc non Af Amer: >60 mL/min (ref >60)
Glucose, Bld: 99 mg/dL (ref 70–99)
Potassium: 3.3 mmol/L — ABNORMAL LOW (ref 3.5–5.1)
Sodium: 137 mmol/L (ref 135–145)

## 2019-04-21 MED ORDER — POTASSIUM CHLORIDE CRYS ER 20 MEQ PO TBCR
40.0000 meq | EXTENDED_RELEASE_TABLET | Freq: Two times a day (BID) | ORAL | Status: DC
  Administered 2019-04-21: 40 meq via ORAL
  Filled 2019-04-21: qty 2

## 2019-04-21 NOTE — Progress Notes (Addendum)
Occupational Therapy Treatment/Discharge Note Patient Details Name: Mary Cruz MRN: [id number] DOB: 1975-07-17 Today's Date: 04/21/2019    History of present illness Patient is a 44 year old female with past medical history of hypertension, fibromyalgia, chronic pain syndrome, irritable bowel, and bronchitis.  She presents today by private auto for evaluation of severe abdominal pain   OT comments  Patient has demonstrated progress with self-care tasks in standing position for bathing, dressing, and toileting. Bed mobility with Independence, mobilizing around room with Independence, and no LOB episodes. Patient was able complete sponge bath while standing at sink and donning of clothing with Independence. Patient has met all short and long term OT goals with performance at Independence level. No further acute OT services at this time.    Follow Up Recommendations  No OT follow up    Equipment Recommendations  None recommended by OT    Recommendations for Other Services      Precautions / Restrictions Precautions Precautions: Fall Restrictions Weight Bearing Restrictions: No       Mobility Bed Mobility Overal bed mobility: Modified Independent                Transfers Overall transfer level: Modified independent     Sit to Stand: Modified independent (Device/Increase time) Stand pivot transfers: Modified independent (Device/Increase time)            Balance                                           ADL either performed or assessed with clinical judgement   ADL   Eating/Feeding: Independent   Grooming: Oral care;Wash/dry face;Wash/dry hands;Brushing hair;Supervision/safety;Set up   Upper Body Bathing: Modified independent   Lower Body Bathing: Modified independent   Upper Body Dressing : Modified independent   Lower Body Dressing: Modified independent   Toilet Transfer: Modified Independent   Toileting- Clothing Manipulation  and Hygiene: Modified independent   Tub/ Shower Transfer: Modified independent   Functional mobility during ADLs: Modified independent       Vision       Perception     Praxis      Cognition Arousal/Alertness: Awake/alert Behavior During Therapy: WFL for tasks assessed/performed Overall Cognitive Status: Within Functional Limits for tasks assessed                                          Exercises     Shoulder Instructions       General Comments      Pertinent Vitals/ Pain       Pain Assessment: 0-10 Pain Score: 2  Faces Pain Scale: Hurts a little bit Pain Location: lower abdomen Pain Descriptors / Indicators: Discomfort  Home Living                                          Prior Functioning/Environment              Frequency           Progress Toward Goals  OT Goals(current goals can now be found in the care plan section)  Progress towards OT goals: Progressing toward goals  ADL Goals Pt Will Transfer to  Toilet: with modified independence;ambulating Pt Will Perform Toileting - Clothing Manipulation and hygiene: with modified independence;sit to/from stand Pt Will Perform Tub/Shower Transfer: Shower transfer;with modified independence  Plan Discharge plan remains appropriate    Co-evaluation                 AM-PAC OT "6 Clicks" Daily Activity     Outcome Measure   Help from another person eating meals?: None Help from another person taking care of personal grooming?: A Little Help from another person toileting, which includes using toliet, bedpan, or urinal?: A Little Help from another person bathing (including washing, rinsing, drying)?: A Little Help from another person to put on and taking off regular upper body clothing?: A Little Help from another person to put on and taking off regular lower body clothing?: A Little 6 Click Score: 19    End of Session        Activity Tolerance Patient  tolerated treatment well   Patient Left in chair;with family/visitor present;with call bell/phone within reach   Nurse Communication Mobility status        Time: 1112-1130 OT Time Calculation (min): 18 min  Charges: OT General Charges $OT Visit: 1 Visit OT Treatments $Self Care/Home Management : 8-22 mins  Deundra Bard OTR/L    Edel Rivero 04/21/2019, 12:45 PM

## 2019-04-21 NOTE — Plan of Care (Signed)
Patient discharged home, discharge orders were reviewed with patient and all questions were answered. Patient went to main entrance in wheel chair.

## 2019-04-21 NOTE — Discharge Summary (Signed)
Physician Discharge Summary  Mary Cruz [id number] DOB: [birth year] DOA: 04/19/2019  PCP: Emeterio Reeve, DO  Admit date: 04/19/2019 Discharge date: 04/21/2019  Admitted From: Home Disposition: Home  Recommendations for Outpatient Follow-up:  1. Follow up with PCP in 1-2 weeks 2. Follow-up with gastroenterologist, referral has been sent.  Discharge Condition: Stable CODE STATUS: Full code Diet recommendation: Low-salt diet, adequate liquids,  Discharge summary: 44 year old female with history of lupus, asthma, fibromyalgia, irritable bowel syndrome, chronic back pain is status post a spinal cord stimulator who presented to the hospital with sudden onset of lower abdominal pain preceded by constipation and followed by loose watery stool.  In the emergency room, she was hemodynamically stable.  A CT scan of the abdomen pelvis was done that showed multiple loops of fluid-filled and a stool-filled colon.  No diverticulitis, ischemia or infarction.  Due to significant symptoms she was admitted for conservative management.  Treated with pain medications, IV fluids.  C. difficile negative.  No more bowel movement for last 24 hours.  He still has some lower abdominal pain, may have radiating pain from lower spine.  Eating soft diet with no aggravation of pain.  Due to clinical stability, she will go home, she will continue to use her pain medications.  Patient complained of altered bowel movements, constipation and diarrhea, has history of IBS and she will benefit with GI follow-up and colonoscopy with biopsies.  Referral was made.  Discharge Diagnoses:  Active Problems:   GERD   Essential hypertension, benign   Chronic pain syndrome   Severe obesity with body mass index (BMI) of 35.0 to 39.9 with comorbidity (HCC)   Opiate dependence (HCC)   Presence of functional implant ([employer] Lumbar spinal cord stimulator implant)   Colitis   Hyperglycemia   Sepsis (HCC)   Dehydration    Elevated lactic acid level   Tobacco abuse    Discharge Instructions  Discharge Instructions    Ambulatory referral to Gastroenterology   Complete by: As directed    Evaluation, possible colonoscopy for altered bowel habits   Call MD for:  severe uncontrolled pain   Complete by: As directed    Diet - low sodium heart healthy   Complete by: As directed    Discharge instructions   Complete by: As directed    Take stool softner if constipated   Increase activity slowly   Complete by: As directed      Allergies as of 04/21/2019      Reactions   Gabapentin Swelling   Celebrex [celecoxib] Swelling   "Body swelling"   Pentazocine Lactate Itching, Other (See Comments)   HALLUCINATIONS   Seroquel [quetiapine] Other (See Comments)   Falls   Amoxicillin Diarrhea, Nausea And Vomiting   Bactrim [sulfamethoxazole-trimethoprim] Other (See Comments)   "Makes very sick"   Doxycycline Other (See Comments)   "SICKNESS"   Erythromycin Other (See Comments)   RESULTANT UTI   Hydromorphone Hcl Other (See Comments)   HALLUCINATIONS   Keflex [cephalexin] Other (See Comments)   Nausea and causes uti's   Latex Itching   Lyrica [pregabalin] Other (See Comments)   "Too sleepy"   Nabumetone Itching      Medication List    TAKE these medications   albuterol 108 (90 Base) MCG/ACT inhaler Commonly known as: VENTOLIN HFA Inhale 1-2 puffs into the lungs every 6 (six) hours as needed for wheezing or shortness of breath.   buPROPion 150 MG 12 hr tablet Commonly known as: Wellbutrin SR  Take 1 tablet (150 mg total) by mouth daily for 3 days, THEN 1 tablet (150 mg total) 2 (two) times daily. Start taking on: April 15, 2019   clonazePAM 0.5 MG tablet Commonly known as: KLONOPIN Take 0.5-1 tablets (0.25-0.5 mg total) by mouth 2 (two) times daily as needed for anxiety.   DULoxetine 60 MG capsule Commonly known as: CYMBALTA Take 1 capsule (60 mg total) by mouth 2 (two) times daily.    HYDROcodone-acetaminophen 5-325 MG tablet Commonly known as: NORCO/VICODIN Take 1 tablet by mouth every 8 (eight) hours as needed for up to 7 days for severe pain. Must last for 7 days.   lisinopril-hydrochlorothiazide 10-12.5 MG tablet Commonly known as: ZESTORETIC Take 1 tablet by mouth daily.   methocarbamol 750 MG tablet Commonly known as: ROBAXIN Take 1 tablet (750 mg total) by mouth every 8 (eight) hours as needed for muscle spasms.   omeprazole 40 MG capsule Commonly known as: PRILOSEC Take 1 capsule (40 mg total) by mouth daily.   oxyCODONE 5 MG immediate release tablet Commonly known as: Oxy IR/ROXICODONE Take 1 tablet (5 mg total) by mouth every 6 (six) hours as needed for severe pain. Must last 30 days   oxyCODONE 5 MG immediate release tablet Commonly known as: Oxy IR/ROXICODONE Take 1 tablet (5 mg total) by mouth every 6 (six) hours as needed for severe pain. Must last 30 days Start taking on: April 23, 2019   oxyCODONE 5 MG immediate release tablet Commonly known as: Oxy IR/ROXICODONE Take 1 tablet (5 mg total) by mouth every 6 (six) hours as needed for severe pain. Must last 30 days Start taking on: May 23, 2019   phentermine 37.5 MG capsule Take 1 capsule (37.5 mg total) by mouth every morning.   rizatriptan 10 MG tablet Commonly known as: Maxalt Take 1 tablet (10 mg total) by mouth as needed for migraine. May repeat in 2 hours if needed       Allergies  Allergen Reactions  . Gabapentin Swelling  . Celebrex [Celecoxib] Swelling    "Body swelling"  . Pentazocine Lactate Itching and Other (See Comments)    HALLUCINATIONS  . Seroquel [Quetiapine] Other (See Comments)    Falls  . Amoxicillin Diarrhea and Nausea And Vomiting  . Bactrim [Sulfamethoxazole-Trimethoprim] Other (See Comments)    "Makes very sick"  . Doxycycline Other (See Comments)    "SICKNESS"  . Erythromycin Other (See Comments)    RESULTANT UTI  . Hydromorphone Hcl Other (See  Comments)    HALLUCINATIONS  . Keflex [Cephalexin] Other (See Comments)    Nausea and causes uti's  . Latex Itching  . Lyrica [Pregabalin] Other (See Comments)    "Too sleepy"  . Nabumetone Itching     Procedures/Studies: DG PAIN CLINIC C-ARM 1-60 MIN NO REPORT  Result Date: 04/08/2019 Fluoro was used, but no Radiologist interpretation will be provided. Please refer to "NOTES" tab for provider progress note.  CT Angio Chest/Abd/Pel for Dissection W and/or Wo Contrast  Result Date: 04/19/2019 CLINICAL DATA:  Chest and abdominal pain EXAM: CT ANGIOGRAPHY CHEST, ABDOMEN AND PELVIS TECHNIQUE: Multidetector CT imaging through the chest, abdomen and pelvis was performed using the standard protocol during bolus administration of intravenous contrast. Multiplanar reconstructed images and MIPs were obtained and reviewed to evaluate the vascular anatomy. CONTRAST:  140mL OMNIPAQUE IOHEXOL 350 MG/ML SOLN COMPARISON:  07/22/2016 FINDINGS: CTA CHEST FINDINGS Cardiovascular: Initial precontrast images show no hyperdense crescent to suggest acute aortic abnormality. Post-contrast images demonstrate the  thoracic aorta to have a normal branching pattern. No evidence of aneurysmal dilatation or dissection is seen. No cardiac enlargement is seen. No coronary calcifications are noted. The pulmonary artery shows no large central embolus although not timed for embolus evaluation. Mediastinum/Nodes: Thoracic inlet is within normal limits. No hilar or mediastinal adenopathy is noted. The esophagus shows evidence of a sliding-type hiatal hernia distally. Lungs/Pleura: Lungs are well aerated bilaterally. No focal infiltrate or sizable effusion is seen. Mild atelectatic changes are noted in the left lung base. A subpleural parenchymal nodule is noted in the right lower lobe best seen on image number 69 of series 6. This measures approximately 4 mm in greatest dimension. No other sizable nodules are seen. Musculoskeletal:  Multiple old rib fractures are noted with healing on the right. No acute rib abnormality is seen. No compression deformity is noted. Review of the MIP images confirms the above findings. CTA ABDOMEN AND PELVIS FINDINGS VASCULAR Aorta: Abdominal aorta shows no aneurysmal dilatation or dissection. Celiac: Patent without evidence of aneurysm, dissection, vasculitis or significant stenosis. SMA: Patent without evidence of aneurysm, dissection, vasculitis or significant stenosis. Renals: Both renal arteries are patent without evidence of aneurysm, dissection, vasculitis, fibromuscular dysplasia or significant stenosis. IMA: Patent without evidence of aneurysm, dissection, vasculitis or significant stenosis. Iliacs: Patent without evidence of aneurysm, dissection, vasculitis or significant stenosis. Veins: No obvious venous abnormality within the limitations of this arterial phase study. Review of the MIP images confirms the above findings. NON-VASCULAR Hepatobiliary: Liver is diffusely decreased in attenuation consistent with fatty infiltration. The gallbladder has been surgically removed. Pancreas: Unremarkable. No pancreatic ductal dilatation or surrounding inflammatory changes. Spleen: Normal in size without focal abnormality. Adrenals/Urinary Tract: Adrenal glands are within normal limits. Kidneys demonstrate a normal enhancement pattern bilaterally. No renal calculi or urinary tract obstructive changes are seen. Scarring is noted in the upper pole of the left kidney stable from a prior exam of 2018. ureters are within normal limits. The bladder is decompressed. Stomach/Bowel: Colon is well distended with fluid and fecal material consistent with the patient's given clinical history of diarrhea. Some fluid-filled loops of small bowel are noted although no definitive obstructive changes are seen. These changes are most consistent with a diffuse colitis. There are some areas in the distal transverse colon where non  dependent air is noted although this is felt to be related to fecal material within the colon as opposed to true pneumatosis. No portal venous air is seen. Lymphatic: No significant lymphadenopathy is noted. Reproductive: Uterus and bilateral adnexa are unremarkable. Other: No abdominal wall hernia or abnormality. No abdominopelvic ascites. Musculoskeletal: No acute or significant osseous findings. Spinal stimulator is noted. Review of the MIP images confirms the above findings. IMPRESSION: Multiple loops of fluid-filled and stool filled colon. This is consistent with the patient's given clinical history of diarrhea. This likely represents a generalized colitis. No evidence of aortic abnormality. Small subpleural nodule on the right as described. No follow-up needed if patient is low-risk. Non-contrast chest CT can be considered in 12 months if patient is high-risk. This recommendation follows the consensus statement: Guidelines for Management of Incidental Pulmonary Nodules Detected on CT Images: From the Fleischner Society 2017; Radiology 2017; 284:228-243. Fatty infiltration of the liver. Electronically Signed   By: Inez Catalina M.D.   On: 04/19/2019 18:16     Subjective: Patient seen and examined.  She had some lower abdominal discomfort and spasm.  Tolerated soft diet.  No bowel movements.  No other complaints.  Discharge Exam: Vitals:   04/21/19 1013 04/21/19 1414  BP: (!) 154/99 (!) 169/87  Pulse: 93 94  Resp: 16 16  Temp: 98.7 F (37.1 C) 98.2 F (36.8 C)  SpO2: 97% 97%   Vitals:   04/21/19 0506 04/21/19 1000 04/21/19 1013 04/21/19 1414  BP: (!) 157/79 (!) 143/82 (!) 154/99 (!) 169/87  Pulse: 93 96 93 94  Resp: 16 18 16 16   Temp: 98.6 F (37 C) 98.1 F (36.7 C) 98.7 F (37.1 C) 98.2 F (36.8 C)  TempSrc: Oral Oral Oral Oral  SpO2: 96%  97% 97%  Weight:      Height:        General: Pt is alert, awake, not in acute distress, walking in the room. Cardiovascular: RRR, S1/S2 +,  no rubs, no gallops Respiratory: CTA bilaterally, no wheezing, no rhonchi Abdominal: Soft, NT, ND, bowel sounds +, no localized tenderness rigidity or guarding. Extremities: no edema, no cyanosis    The results of significant diagnostics from this hospitalization (including imaging, microbiology, ancillary and laboratory) are listed below for reference.     Microbiology: Recent Results (from the past 240 hour(s))  Culture, blood (routine x 2)     Status: None (Preliminary result)   Collection Time: 04/19/19  5:28 PM   Specimen: Site Not Specified; Blood  Result Value Ref Range Status   Specimen Description   Final    SITE NOT SPECIFIED Performed at [facility] [address] [place], [place] [address]    Special Requests   Final    BOTTLES DRAWN AEROBIC ONLY Blood Culture adequate volume Performed at [facility] [address] [place], [place] [address]    Culture   Final    NO GROWTH 2 DAYS Performed at [facility] [address] [place], [place] [address]    Report Status PENDING  Incomplete  Culture, blood (routine x 2)     Status: None (Preliminary result)   Collection Time: 04/19/19  6:29 PM   Specimen: BLOOD  Result Value Ref Range Status   Specimen Description   Final    BLOOD RIGHT ANTECUBITAL Performed at [facility] [address] [place], [place] [address]    Special Requests   Final    BOTTLES DRAWN AEROBIC AND ANAEROBIC Blood Culture adequate volume Performed at [facility] [address], [place], [place] [address]    Culture   Final    NO GROWTH 2 DAYS Performed at [facility] [address], [place], [place] [address]    Report Status PENDING  Incomplete  SARS CORONAVIRUS 2 (TAT 6-24 HRS) Nasopharyngeal Urine, Random     Status: None   Collection Time: 04/19/19  7:49 PM   Specimen: Urine, Random; Nasopharyngeal  Result Value Ref Range Status   SARS  Coronavirus 2 NEGATIVE NEGATIVE Final    Comment: (NOTE) SARS-CoV-2 target nucleic acids are NOT DETECTED. The SARS-CoV-2 RNA is generally detectable in upper and lower respiratory specimens during the acute phase of infection. Negative results do not preclude SARS-CoV-2 infection, do not rule out co-infections with other pathogens, and should not be used as the sole basis for treatment or other patient management decisions. Negative results must be combined with clinical observations, patient history, and epidemiological information. The expected result is Negative. Fact Sheet for Patients: SugarRoll.be Fact Sheet for Healthcare Providers: https://www.woods-mathews.com/ This test is not yet approved or cleared by the [place] FDA  and  has been authorized for detection and/or diagnosis of SARS-CoV-2 by FDA under an Emergency Use Authorization (EUA). This EUA will remain  in effect (meaning this test can be used) for the duration of the COVID-19 declaration under Section 56 4(b)(1) of the Act, 21 U.S.C. section 360bbb-3(b)(1), unless the authorization is terminated or revoked sooner. Performed at [facility], [place] [address], [place], [place] [address]   C difficile quick scan w PCR reflex     Status: None   Collection Time: 04/19/19  7:49 PM   Specimen: STOOL  Result Value Ref Range Status   C Diff antigen NEGATIVE NEGATIVE Final   C Diff toxin NEGATIVE NEGATIVE Final   C Diff interpretation No C. difficile detected.  Final    Comment: Performed at [facility], [place] [address] [place], [place] [address]  MRSA PCR Screening     Status: None   Collection Time: 04/19/19 10:04 PM   Specimen: Nasal Mucosa; Nasopharyngeal  Result Value Ref Range Status   MRSA by PCR NEGATIVE NEGATIVE Final    Comment:        The GeneXpert MRSA Assay (FDA approved for NASAL specimens only), is one component of a comprehensive  MRSA colonization surveillance program. It is not intended to diagnose MRSA infection nor to guide or monitor treatment for MRSA infections. Performed at [facility], [place] [address] [place], [place] [address]      Labs: BNP (last 3 results) No results for input(s): BNP in the last 8760 hours. Basic Metabolic Panel: Recent Labs  Lab 04/19/19 1729 04/19/19 1949 04/19/19 1955 04/20/19 0251 04/21/19 0456  NA 135  --   --  140 137  K 4.1  --   --  3.5 3.3*  CL 96*  --   --  110 104  CO2 25  --   --  19* 26  GLUCOSE 209*  --   --  124* 99  BUN 19  --   --  17 13  CREATININE 1.77*  --   --  1.17* 0.87  CALCIUM 9.8  --   --  7.6* 8.4*  MG  --  2.1  --  2.0  --   PHOS  --   --  3.4 3.2  --    Liver Function Tests: Recent Labs  Lab 04/19/19 1729 04/20/19 0251  AST 25 20  ALT 26 19  ALKPHOS 122 88  BILITOT 1.1 0.9  PROT 8.5* 5.4*  ALBUMIN 4.3 2.7*   Recent Labs  Lab 04/19/19 1729  LIPASE 23   No results for input(s): AMMONIA in the last 168 hours. CBC: Recent Labs  Lab 04/19/19 1816 04/20/19 0605 04/21/19 0456  WBC 19.0* 25.8* 15.5*  NEUTROABS 15.4*  --   --   HGB 14.6 12.6 11.0*  HCT 47.3* 40.3 35.6*  MCV 92.4 92.9 93.9  PLT 370 257 246   Cardiac Enzymes: Recent Labs  Lab 04/19/19 1955  CKTOTAL 68   BNP: Invalid input(s): POCBNP CBG: Recent Labs  Lab 04/20/19 1937 04/20/19 2329 04/21/19 0353 04/21/19 0727 04/21/19 1110  GLUCAP 125* 105* 92 99 114*   D-Dimer No results for input(s): DDIMER in the last 72 hours. Hgb A1c Recent Labs    04/20/19 0251  HGBA1C 5.7*   Lipid Profile No results for input(s): CHOL, HDL, LDLCALC, TRIG, CHOLHDL, LDLDIRECT in the last 72 hours. Thyroid function studies Recent Labs    04/20/19 0251  TSH  0.623   Anemia work up No results for input(s): VITAMINB12, FOLATE, FERRITIN, TIBC, IRON, RETICCTPCT in the last 72 hours. Urinalysis    Component Value Date/Time   COLORURINE AMBER (A)  04/19/2019 1949   APPEARANCEUR CLEAR 04/19/2019 1949   LABSPEC 1.039 (H) 04/19/2019 1949   PHURINE 6.0 04/19/2019 1949   GLUCOSEU NEGATIVE 04/19/2019 1949   HGBUR NEGATIVE 04/19/2019 1949   BILIRUBINUR NEGATIVE 04/19/2019 1949   BILIRUBINUR neg 07/22/2016 1559   KETONESUR NEGATIVE 04/19/2019 1949   PROTEINUR 30 (A) 04/19/2019 1949   UROBILINOGEN 0.2 07/22/2016 1559   UROBILINOGEN 1.0 10/09/2013 2331   NITRITE NEGATIVE 04/19/2019 1949   LEUKOCYTESUR NEGATIVE 04/19/2019 1949   Sepsis Labs Invalid input(s): PROCALCITONIN,  WBC,  LACTICIDVEN Microbiology Recent Results (from the past 240 hour(s))  Culture, blood (routine x 2)     Status: None (Preliminary result)   Collection Time: 04/19/19  5:28 PM   Specimen: Site Not Specified; Blood  Result Value Ref Range Status   Specimen Description   Final    SITE NOT SPECIFIED Performed at [facility], [place] [address] [place], [place] [address]    Special Requests   Final    BOTTLES DRAWN AEROBIC ONLY Blood Culture adequate volume Performed at [facility] [address] [place], [place] [address]    Culture   Final    NO GROWTH 2 DAYS Performed at [facility] [address], [place], [place] [address]    Report Status PENDING  Incomplete  Culture, blood (routine x 2)     Status: None (Preliminary result)   Collection Time: 04/19/19  6:29 PM   Specimen: BLOOD  Result Value Ref Range Status   Specimen Description   Final    BLOOD RIGHT ANTECUBITAL Performed at [facility] [address] [place], [place] [address]    Special Requests   Final    BOTTLES DRAWN AEROBIC AND ANAEROBIC Blood Culture adequate volume Performed at [facility] [address] [place], [place] [address]    Culture   Final    NO GROWTH 2 DAYS Performed at [facility] [address], [place], [place] [address]    Report Status PENDING  Incomplete  SARS  CORONAVIRUS 2 (TAT 6-24 HRS) Nasopharyngeal Urine, Random     Status: None   Collection Time: 04/19/19  7:49 PM   Specimen: Urine, Random; Nasopharyngeal  Result Value Ref Range Status   SARS Coronavirus 2 NEGATIVE NEGATIVE Final    Comment: (NOTE) SARS-CoV-2 target nucleic acids are NOT DETECTED. The SARS-CoV-2 RNA is generally detectable in upper and lower respiratory specimens during the acute phase of infection. Negative results do not preclude SARS-CoV-2 infection, do not rule out co-infections with other pathogens, and should not be used as the sole basis for treatment or other patient management decisions. Negative results must be combined with clinical observations, patient history, and epidemiological information. The expected result is Negative. Fact Sheet for Patients: SugarRoll.be Fact Sheet for Healthcare Providers: https://www.woods-mathews.com/ This test is not yet approved or cleared by the [place] FDA and  has been authorized for detection and/or diagnosis of SARS-CoV-2 by FDA under an Emergency Use Authorization (EUA). This EUA will remain  in effect (meaning this test can be used) for the duration of the COVID-19 declaration under Section 56 4(b)(1) of the Act, 21 U.S.C. section 360bbb-3(b)(1), unless the authorization is terminated or revoked sooner. Performed at [facility], [address] [place], [place] [address]   C difficile quick scan w PCR reflex     Status: None   Collection Time: 04/19/19  7:49 PM   Specimen: STOOL  Result Value Ref Range Status   C Diff antigen NEGATIVE NEGATIVE Final   C Diff toxin NEGATIVE NEGATIVE Final   C Diff interpretation No C. difficile detected.  Final    Comment: Performed at [facility], [place] [address] [place], [place] [address]  MRSA PCR Screening     Status: None   Collection Time: 04/19/19 10:04 PM   Specimen: Nasal Mucosa; Nasopharyngeal   Result Value Ref Range Status   MRSA by PCR NEGATIVE NEGATIVE Final    Comment:        The GeneXpert MRSA Assay (FDA approved for NASAL specimens only), is one component of a comprehensive MRSA colonization surveillance program. It is not intended to diagnose MRSA infection nor to guide or monitor treatment for MRSA infections. Performed at [facility], [place] [address], [place], [place] [address]      Time coordinating discharge:  35 minutes  SIGNED:   Barb Merino, MD  [facility] 04/21/2019, 3:15 PM

## 2019-04-22 ENCOUNTER — Ambulatory Visit: Payer: BLUE CROSS/BLUE SHIELD | Admitting: Pain Medicine

## 2019-04-22 LAB — GI PATHOGEN PANEL BY PCR, STOOL

## 2019-04-24 LAB — CULTURE, BLOOD (ROUTINE X 2)
Culture: NO GROWTH
Culture: NO GROWTH
Special Requests: ADEQUATE
Special Requests: ADEQUATE

## 2019-04-26 ENCOUNTER — Encounter: Payer: Self-pay | Admitting: Gastroenterology

## 2019-04-26 ENCOUNTER — Encounter: Payer: Self-pay | Admitting: Internal Medicine

## 2019-04-27 LAB — GLUCOSE, CAPILLARY: Glucose-Capillary: 113 mg/dL — ABNORMAL HIGH (ref 70–99)

## 2019-04-28 ENCOUNTER — Encounter: Payer: Self-pay | Admitting: Osteopathic Medicine

## 2019-04-28 ENCOUNTER — Telehealth: Payer: Self-pay

## 2019-04-28 ENCOUNTER — Telehealth (INDEPENDENT_AMBULATORY_CARE_PROVIDER_SITE_OTHER): Payer: BLUE CROSS/BLUE SHIELD | Admitting: Osteopathic Medicine

## 2019-04-28 VITALS — BP 145/72 | Wt 275.0 lb

## 2019-04-28 DIAGNOSIS — R1012 Left upper quadrant pain: Secondary | ICD-10-CM | POA: Diagnosis not present

## 2019-04-28 DIAGNOSIS — T402X5A Adverse effect of other opioids, initial encounter: Secondary | ICD-10-CM

## 2019-04-28 DIAGNOSIS — I10 Essential (primary) hypertension: Secondary | ICD-10-CM

## 2019-04-28 DIAGNOSIS — F329 Major depressive disorder, single episode, unspecified: Secondary | ICD-10-CM

## 2019-04-28 DIAGNOSIS — K5903 Drug induced constipation: Secondary | ICD-10-CM | POA: Diagnosis not present

## 2019-04-28 DIAGNOSIS — F419 Anxiety disorder, unspecified: Secondary | ICD-10-CM

## 2019-04-28 DIAGNOSIS — F32A Depression, unspecified: Secondary | ICD-10-CM

## 2019-04-28 MED ORDER — NALOXEGOL OXALATE 12.5 MG PO TABS: 12.5000 mg | ORAL_TABLET | Freq: Every day | ORAL | 0 refills | Status: DC

## 2019-04-28 MED ORDER — LISINOPRIL-HYDROCHLOROTHIAZIDE 10-12.5 MG PO TABS: 1.0000 | ORAL_TABLET | Freq: Every day | ORAL | 3 refills | Status: DC

## 2019-04-28 MED ORDER — DULOXETINE HCL 60 MG PO CPEP: 60.0000 mg | ORAL_CAPSULE | Freq: Two times a day (BID) | ORAL | 3 refills | Status: DC

## 2019-04-28 MED ORDER — FLUCONAZOLE 150 MG PO TABS: 150.0000 mg | ORAL_TABLET | Freq: Once | ORAL | 2 refills | Status: AC

## 2019-04-28 NOTE — Progress Notes (Signed)
Virtual Visit via Phone I connected with      [name] on 04/28/19 at 11:29 AM  by a telemedicine application and verified that I am speaking with the correct person using two identifiers.  Patient is at home I am in office   I discussed the limitations of evaluation and management by telemedicine and the availability of in person appointments. The patient expressed understanding and agreed to proceed.  History of Present Illness: Mary Cruz is a 44 y.o. female who would like to discuss hospital follow-up.   Hospital records and discharge summary reviewed.    Admitted 04/19/2019, discharged 2 days later on 04/21/2019(one week ago).  Patient experienced sudden long lower abdominal pain associated with loose watery stool.    CT of abdomen in ED showed multiple loops of fluid-filled and stool-filled colon.    Admitted, treated with pain medications, IV fluids, soft diet.    History of irritable bowel syndrome, alternating constipation/diarrhea, GI referral was made to get set up for colonoscopy.    Blood cultures negative.  Creatinine on admission was 1.77, normalized to 0.87 by time of discharge.  Also at discharge, slight hypokalemia 3.3 and hypocalcemia 8.4  Today one week post-discharge: reports   Has had to go back to liquid diet for about a day or two at first, soft foods were causing abdominal pain. She has been able to advance now to breads, mashed potatoes. More solid foods. L side appeared to swell I'm upper quadrant. Reports constipation. Decided to go to [facility] - has appt 05/25/19.   Overall doing okay other than yeast infection  Reports constipation has been an issue     Observations/Objective: BP (!) 145/72   Wt 275 lb (124.7 kg)   LMP 04/04/2019   BMI 47.20 kg/m  BP Readings from Last 3 Encounters:  04/28/19 (!) 145/72  04/21/19 (!) 169/87  04/15/19 125/85   Exam: Normal Speech.    Lab and Radiology Results No results found for this  or any previous visit (from the past 72 hour(s)). No results found.     Assessment and Plan: 44 y.o. female with The primary encounter diagnosis was Left upper quadrant abdominal pain. Diagnoses of Anxiety and depression, Essential hypertension, benign, and Constipation due to opioid therapy were also pertinent to this visit.   ER precautions reviewed  No concern for obstruction or diverticulitis based on pt symptoms Trial Movantik - opiate use may be contributing to IBS-C-type symptoms     PDMP not reviewed this encounter. No orders of the defined types were placed in this encounter.  Meds ordered this encounter  Medications  . naloxegol oxalate (MOVANTIK) 12.5 MG TABS tablet    Sig: Take 1 tablet (12.5 mg total) by mouth daily.    Dispense:  90 tablet    Refill:  0  . DULoxetine (CYMBALTA) 60 MG capsule    Sig: Take 1 capsule (60 mg total) by mouth 2 (two) times daily.    Dispense:  180 capsule    Refill:  3    Insurance only covering daily (#30 for 30 days). If PA is needed for bid dosing #60 for 30 days, or #180 for 90 days) please let us know  . lisinopril-hydrochlorothiazide (ZESTORETIC) 10-12.5 MG tablet    Sig: Take 1 tablet by mouth daily.    Dispense:  90 tablet    Refill:  3    Cancel all previous Rx for lisinopril-hct  . fluconazole (DIFLUCAN) 150  MG tablet    Sig: Take 1 tablet (150 mg total) by mouth once for 1 dose. Repeat dose 72 hours if yeast infection persists    Dispense:  2 tablet    Refill:  2       Follow Up Instructions: Return if symptoms worsen or fail to improve.    I discussed the assessment and treatment plan with the patient. The patient was provided an opportunity to ask questions and all were answered. The patient agreed with the plan and demonstrated an understanding of the instructions.   The patient was advised to call back or seek an in-person evaluation if any new concerns, if symptoms worsen or if the condition fails to improve as  anticipated.  30 minutes of non-face-to-face time was provided during this encounter.      . . . . . . . . . . . . . [name]                   Historical information moved to improve visibility of documentation.  Past Medical History:  Diagnosis Date  . Abscess of axillary fold 10/10/2013  . Acute bronchitis with asthma with acute exacerbation 01/23/2015  . Fibromyalgia   . Hypertension   . Lupus ([place])   . Osteoarthritis   . Pain    chronic regional pain syndrome  . Sepsis affecting skin 10/10/2013   Past Surgical History:  Procedure Laterality Date  . BREAST SURGERY    . CHOLECYSTECTOMY  2000  . ENDOMETRIAL ABLATION    . KNEE SURGERY    . KNEE SURGERY Right 02-2015  . OCCIPITAL NERVE STIMULATOR INSERTION    . SHOULDER ARTHROSCOPY WITH DISTAL CLAVICLE RESECTION Right 10/29/2018   Procedure: SHOULDER ARTHROSCOPY WITH DISTAL CLAVICLE RESECTION;  Surgeon: Hiram Gash, MD;  Location: [place];  Service: Orthopedics;  Laterality: Right;  . SHOULDER SURGERY    . spinal cord stimulator    . SPINAL CORD STIMULATOR IMPLANT  2008  . SPINAL CORD STIMULATOR INSERTION N/A 06/21/2016   Procedure: LUMBAR SPINAL CORD STIMULATOR INSERTION;  Surgeon: Clydell Hakim, MD;  Location: [place];  Service: Neurosurgery;  Laterality: N/A;  LUMBAR SPINAL CORD STIMULATOR INSERTION  . SUBACROMIAL DECOMPRESSION Right 10/29/2018   Procedure: SUBACROMIAL DECOMPRESSION;  Surgeon: Hiram Gash, MD;  Location: [place];  Service: Orthopedics;  Laterality: Right;  . TUBAL LIGATION  2000   Social History   Tobacco Use  . Smoking status: Former Smoker    Types: Cigarettes  . Smokeless tobacco: Former Systems developer    Quit date: 12/22/2017  Substance Use Topics  . Alcohol use: No    Alcohol/week: 0.0 standard drinks   family history includes Asthma in her mother; Depression in her mother; Fibromyalgia in an other family member; Stroke in an other family  member; Thyroid disease in an other family member.  Medications: Current Outpatient Medications  Medication Sig Dispense Refill  . albuterol (PROVENTIL HFA;VENTOLIN HFA) 108 (90 Base) MCG/ACT inhaler Inhale 1-2 puffs into the lungs every 6 (six) hours as needed for wheezing or shortness of breath. 1 Inhaler 0  . clonazePAM (KLONOPIN) 0.5 MG tablet Take 0.5-1 tablets (0.25-0.5 mg total) by mouth 2 (two) times daily as needed for anxiety. 30 tablet 0  . DULoxetine (CYMBALTA) 60 MG capsule Take 1 capsule (60 mg total) by mouth 2 (two) times daily. 180 capsule 3  . lisinopril-hydrochlorothiazide (ZESTORETIC) 10-12.5 MG tablet Take 1 tablet by mouth daily. 90 tablet  3  . methocarbamol (ROBAXIN) 750 MG tablet Take 1 tablet (750 mg total) by mouth every 8 (eight) hours as needed for muscle spasms. 90 tablet 5  . omeprazole (PRILOSEC) 40 MG capsule Take 1 capsule (40 mg total) by mouth daily. 90 capsule 3  . oxyCODONE (OXY IR/ROXICODONE) 5 MG immediate release tablet Take 1 tablet (5 mg total) by mouth every 6 (six) hours as needed for severe pain. Must last 30 days 120 tablet 0  . [START ON 05/23/2019] oxyCODONE (OXY IR/ROXICODONE) 5 MG immediate release tablet Take 1 tablet (5 mg total) by mouth every 6 (six) hours as needed for severe pain. Must last 30 days 120 tablet 0  . phentermine 37.5 MG capsule Take 1 capsule (37.5 mg total) by mouth every morning. 30 capsule 0  . rizatriptan (MAXALT) 10 MG tablet Take 1 tablet (10 mg total) by mouth as needed for migraine. May repeat in 2 hours if needed 10 tablet 12  . fluconazole (DIFLUCAN) 150 MG tablet Take 1 tablet (150 mg total) by mouth once for 1 dose. Repeat dose 72 hours if yeast infection persists 2 tablet 2  . naloxegol oxalate (MOVANTIK) 12.5 MG TABS tablet Take 1 tablet (12.5 mg total) by mouth daily. 90 tablet 0  . oxyCODONE (OXY IR/ROXICODONE) 5 MG immediate release tablet Take 1 tablet (5 mg total) by mouth every 6 (six) hours as needed for severe  pain. Must last 30 days 120 tablet 0   No current facility-administered medications for this visit.   Allergies  Allergen Reactions  . Gabapentin Swelling  . Celebrex [Celecoxib] Swelling    "Body swelling"  . Pentazocine Lactate Itching and Other (See Comments)    HALLUCINATIONS  . Seroquel [Quetiapine] Other (See Comments)    Falls  . Amoxicillin Diarrhea and Nausea And Vomiting  . Bactrim [Sulfamethoxazole-Trimethoprim] Other (See Comments)    "Makes very sick"  . Doxycycline Other (See Comments)    "SICKNESS"  . Erythromycin Other (See Comments)    RESULTANT UTI  . Hydromorphone Hcl Other (See Comments)    HALLUCINATIONS  . Keflex [Cephalexin] Other (See Comments)    Nausea and causes uti's  . Latex Itching  . Lyrica [Pregabalin] Other (See Comments)    "Too sleepy"  . Nabumetone Itching

## 2019-04-28 NOTE — Telephone Encounter (Signed)
As per [facility] pharmacy:  Insurance will only cover #30 for 30 for Duloxetine 60 mg. Prior authorization is required for exceeded daily limit -  #60 for 30 or #180 for 90. Thanks.

## 2019-05-06 ENCOUNTER — Telehealth: Payer: Self-pay | Admitting: Pain Medicine

## 2019-05-06 NOTE — Telephone Encounter (Signed)
Patient needs to discuss med refill dates, please call asap

## 2019-05-06 NOTE — Telephone Encounter (Signed)
Pharmacy called and there IS a script to be filled from March. They will fill today for her. I tried to call the patient back and I cannot get the call to go through or leave a VM.

## 2019-05-06 NOTE — Telephone Encounter (Signed)
Patient states no script for Oxycodone for March. I see one that could be filled March 12,2021. I will call pharmacy later today to check on this.

## 2019-05-13 ENCOUNTER — Ambulatory Visit
Admission: RE | Admit: 2019-05-13 | Discharge: 2019-05-13 | Disposition: A | Discharge status: Home / Self Care | Payer: BLUE CROSS/BLUE SHIELD | Source: Ambulatory Visit | Attending: Pain Medicine | Admitting: Pain Medicine

## 2019-05-13 ENCOUNTER — Telehealth: Payer: Self-pay | Admitting: *Deleted

## 2019-05-13 ENCOUNTER — Other Ambulatory Visit: Payer: Self-pay

## 2019-05-13 ENCOUNTER — Encounter: Payer: Self-pay | Admitting: Pain Medicine

## 2019-05-13 ENCOUNTER — Ambulatory Visit (HOSPITAL_BASED_OUTPATIENT_CLINIC_OR_DEPARTMENT_OTHER): Payer: BLUE CROSS/BLUE SHIELD | Admitting: Pain Medicine

## 2019-05-13 VITALS — BP 118/80 | HR 90 | Temp 97.9°F | Resp 18 | Ht 64.0 in | Wt 275.0 lb

## 2019-05-13 DIAGNOSIS — Z91040 Latex allergy status: Secondary | ICD-10-CM

## 2019-05-13 DIAGNOSIS — G8918 Other acute postprocedural pain: Secondary | ICD-10-CM

## 2019-05-13 DIAGNOSIS — M47816 Spondylosis without myelopathy or radiculopathy, lumbar region: Secondary | ICD-10-CM

## 2019-05-13 DIAGNOSIS — M47817 Spondylosis without myelopathy or radiculopathy, lumbosacral region: Secondary | ICD-10-CM | POA: Insufficient documentation

## 2019-05-13 DIAGNOSIS — M5136 Other intervertebral disc degeneration, lumbar region: Secondary | ICD-10-CM | POA: Insufficient documentation

## 2019-05-13 DIAGNOSIS — M545 Low back pain: Secondary | ICD-10-CM | POA: Diagnosis not present

## 2019-05-13 DIAGNOSIS — G8929 Other chronic pain: Secondary | ICD-10-CM | POA: Insufficient documentation

## 2019-05-13 MED ORDER — TRIAMCINOLONE ACETONIDE 40 MG/ML IJ SUSP
40.0000 mg | Freq: Once | INTRAMUSCULAR | Status: AC
  Administered 2019-05-13: 40 mg
  Filled 2019-05-13: qty 1

## 2019-05-13 MED ORDER — HYDROCODONE-ACETAMINOPHEN 5-325 MG PO TABS: 1.0000 | ORAL_TABLET | Freq: Three times a day (TID) | ORAL | 0 refills | Status: AC | PRN

## 2019-05-13 MED ORDER — ROPIVACAINE HCL 2 MG/ML IJ SOLN
9.0000 mL | Freq: Once | INTRAMUSCULAR | Status: AC
  Administered 2019-05-13: 9 mL via PERINEURAL
  Filled 2019-05-13: qty 10

## 2019-05-13 MED ORDER — FENTANYL CITRATE (PF) 100 MCG/2ML IJ SOLN
25.0000 ug | INTRAMUSCULAR | Status: AC | PRN
  Administered 2019-05-13 (×2): 50 ug via INTRAVENOUS
  Filled 2019-05-13: qty 2

## 2019-05-13 MED ORDER — LACTATED RINGERS IV SOLN
1000.0000 mL | Freq: Once | INTRAVENOUS | Status: AC
  Administered 2019-05-13: 1000 mL via INTRAVENOUS

## 2019-05-13 MED ORDER — LIDOCAINE HCL 2 % IJ SOLN
20.0000 mL | Freq: Once | INTRAMUSCULAR | Status: AC
  Administered 2019-05-13: 400 mg
  Filled 2019-05-13: qty 20

## 2019-05-13 MED ORDER — MIDAZOLAM HCL 5 MG/5ML IJ SOLN
1.0000 mg | INTRAMUSCULAR | Status: DC | PRN
  Administered 2019-05-13: 1 mg via INTRAVENOUS
  Administered 2019-05-13: 2 mg via INTRAVENOUS
  Administered 2019-05-13: 1 mg via INTRAVENOUS
  Filled 2019-05-13: qty 5

## 2019-05-13 NOTE — Progress Notes (Signed)
PROVIDER NOTE: Information contained herein reflects review and annotations entered in association with encounter. Interpretation of such information and data should be left to medically-trained personnel. Information provided to patient can be located elsewhere in the medical record under "Patient Instructions". Document created using STT-dictation technology, any transcriptional errors that may result from process are unintentional.    Patient: Mary Cruz  Service Category: Procedure  Provider: Gaspar Cola, MD  DOB: Jul 04, 1975  DOS: 05/13/2019  Location: [facility]  MRN: [id number]  Setting: Ambulatory - outpatient  Referring Provider: Emeterio Reeve, DO  Type: Established Patient  Specialty: Interventional Pain Management  PCP: Emeterio Reeve, DO   Primary Reason for Visit: Interventional Pain Management Treatment. CC: Back Pain (right low)  Procedure:          Anesthesia, Analgesia, Anxiolysis:  Type: Thermal Lumbar Facet, Medial Branch Radiofrequency Ablation/Neurotomy  #2  Primary Purpose: Therapeutic Region: Posterolateral Lumbosacral Spine Level: L2, L3, L4, L5, & S1 Medial Branch Level(s). These levels will denervate the L3-4, L4-5, and the L5-S1 lumbar facet joints. Laterality: Right  Type: Moderate (Conscious) Sedation combined with Local Anesthesia Indication(s): Analgesia and Anxiety Route: Intravenous (IV) IV Access: Secured Sedation: Meaningful verbal contact was maintained at all times during the procedure  Local Anesthetic: Lidocaine 1-2%  Position: Prone   Indications: 1. Lumbar facet syndrome (Bilateral) (R>L)   2. Spondylosis without myelopathy or radiculopathy, lumbosacral region   3. DDD (degenerative disc disease), lumbar   4. Lumbar spondylosis   5. Chronic low back pain (Bilateral) (R>L)    Latex precautions, history of latex allergy    Morbid obesity ([place])    Mary Cruz has been dealing with the above chronic pain for  longer than three months and has either failed to respond, was unable to tolerate, or simply did not get enough benefit from other more conservative therapies including, but not limited to: 1. Over-the-counter medications 2. Anti-inflammatory medications 3. Muscle relaxants 4. Membrane stabilizers 5. Opioids 6. Physical therapy and/or chiropractic manipulation 7. Modalities (Heat, ice, etc.) 8. Invasive techniques such as nerve blocks. Mary Cruz has attained more than 50% relief of the pain from a series of diagnostic injections conducted in separate occasions.  Pain Score: Pre-procedure: 5 /10 Post-procedure: 0-No pain/10  Pre-op Assessment:  Mary Cruz is a 44 y.o. (year old), female patient, seen today for interventional treatment. She  has a past surgical history that includes spinal cord stimulator; Occipital nerve stimulator insertion; Cholecystectomy (2000); Knee surgery; Shoulder surgery; Tubal ligation (2000); Endometrial ablation; Spinal cord stimulator implant (2008); Knee surgery (Right, 02-2015); Breast surgery; Spinal cord stimulator insertion (N/A, 06/21/2016); Shoulder arthroscopy with distal clavicle resection (Right, 10/29/2018); and Subacromial decompression (Right, 10/29/2018). Mary Cruz has a current medication list which includes the following prescription(s): albuterol, clonazepam, duloxetine, lisinopril-hydrochlorothiazide, methocarbamol, omeprazole, oxycodone, [START ON 05/23/2019] oxycodone, rizatriptan, hydrocodone-acetaminophen, [START ON 05/20/2019] hydrocodone-acetaminophen, naloxegol oxalate, oxycodone, and phentermine, and the following Facility-Administered Medications: midazolam. Her primarily concern today is the Back Pain (right low)  Initial Vital Signs:  Pulse/HCG Rate: (!) 108ECG Heart Rate: 85 Temp: (!) 97.3 F (36.3 C) Resp: 18 BP: (!) 112/100 SpO2: 100 %  BMI: Estimated body mass index is 47.2 kg/m as calculated from the following:   Height as of  this encounter: 5\' 4"  (1.626 m).   Weight as of this encounter: 275 lb (124.7 kg).  Risk Assessment: Allergies: Reviewed. She is allergic to gabapentin; celebrex [celecoxib]; pentazocine lactate; seroquel [quetiapine]; amoxicillin; bactrim [sulfamethoxazole-trimethoprim]; doxycycline; erythromycin; hydromorphone  hcl; keflex [cephalexin]; latex; lyrica [pregabalin]; and nabumetone.  Allergy Precautions: None required Coagulopathies: Reviewed. None identified.  Blood-thinner therapy: None at this time Active Infection(s): Reviewed. None identified. Mary Cruz is afebrile  Site Confirmation: Mary Cruz was asked to confirm the procedure and laterality before marking the site Procedure checklist: Completed Consent: Before the procedure and under the influence of no sedative(s), amnesic(s), or anxiolytics, the patient was informed of the treatment options, risks and possible complications. To fulfill our ethical and legal obligations, as recommended by the American Medical Association's Code of Ethics, I have informed the patient of my clinical impression; the nature and purpose of the treatment or procedure; the risks, benefits, and possible complications of the intervention; the alternatives, including doing nothing; the risk(s) and benefit(s) of the alternative treatment(s) or procedure(s); and the risk(s) and benefit(s) of doing nothing. The patient was provided information about the general risks and possible complications associated with the procedure. These may include, but are not limited to: failure to achieve desired goals, infection, bleeding, organ or nerve damage, allergic reactions, paralysis, and death. In addition, the patient was informed of those risks and complications associated to Spine-related procedures, such as failure to decrease pain; infection (i.e.: Meningitis, epidural or intraspinal abscess); bleeding (i.e.: epidural hematoma, subarachnoid hemorrhage, or any other type of  intraspinal or peri-dural bleeding); organ or nerve damage (i.e.: Any type of peripheral nerve, nerve root, or spinal cord injury) with subsequent damage to sensory, motor, and/or autonomic systems, resulting in permanent pain, numbness, and/or weakness of one or several areas of the body; allergic reactions; (i.e.: anaphylactic reaction); and/or death. Furthermore, the patient was informed of those risks and complications associated with the medications. These include, but are not limited to: allergic reactions (i.e.: anaphylactic or anaphylactoid reaction(s)); adrenal axis suppression; blood sugar elevation that in diabetics may result in ketoacidosis or comma; water retention that in patients with history of congestive heart failure may result in shortness of breath, pulmonary edema, and decompensation with resultant heart failure; weight gain; swelling or edema; medication-induced neural toxicity; particulate matter embolism and blood vessel occlusion with resultant organ, and/or nervous system infarction; and/or aseptic necrosis of one or more joints. Finally, the patient was informed that Medicine is not an exact science; therefore, there is also the possibility of unforeseen or unpredictable risks and/or possible complications that may result in a catastrophic outcome. The patient indicated having understood very clearly. We have given the patient no guarantees and we have made no promises. Enough time was given to the patient to ask questions, all of which were answered to the patient's satisfaction. Mary Cruz has indicated that she wanted to continue with the procedure. Attestation: I, the ordering provider, attest that I have discussed with the patient the benefits, risks, side-effects, alternatives, likelihood of achieving goals, and potential problems during recovery for the procedure that I have provided informed consent. Date  Time: 05/13/2019 10:16 AM  Pre-Procedure Preparation:  Monitoring:  As per clinic protocol. Respiration, ETCO2, SpO2, BP, heart rate and rhythm monitor placed and checked for adequate function Safety Precautions: Patient was assessed for positional comfort and pressure points before starting the procedure. Time-out: I initiated and conducted the "Time-out" before starting the procedure, as per protocol. The patient was asked to participate by confirming the accuracy of the "Time Out" information. Verification of the correct person, site, and procedure were performed and confirmed by me, the nursing staff, and the patient. "Time-out" conducted as per Joint Commission's Universal Protocol (UP.01.01.01). Time:  1103  Description of Procedure:          Laterality: Right Levels:  L2, L3, L4, L5, & S1 Medial Branch Level(s), at the L3-4, L4-5, and the L5-S1 lumbar facet joints. Area Prepped: Lumbosacral DuraPrep (Iodine Povacrylex [0.7% available iodine] and Isopropyl Alcohol, 74% w/w) Safety Precautions: Aspiration looking for blood return was conducted prior to all injections. At no point did we inject any substances, as a needle was being advanced. Before injecting, the patient was told to immediately notify me if she was experiencing any new onset of "ringing in the ears, or metallic taste in the mouth". No attempts were made at seeking any paresthesias. Safe injection practices and needle disposal techniques used. Medications properly checked for expiration dates. SDV (single dose vial) medications used. After the completion of the procedure, all disposable equipment used was discarded in the proper designated medical waste containers. Local Anesthesia: Protocol guidelines were followed. The patient was positioned over the fluoroscopy table. The area was prepped in the usual manner. The time-out was completed. The target area was identified using fluoroscopy. A 12-in long, straight, sterile hemostat was used with fluoroscopic guidance to locate the targets for each level  blocked. Once located, the skin was marked with an approved surgical skin marker. Once all sites were marked, the skin (epidermis, dermis, and hypodermis), as well as deeper tissues (fat, connective tissue and muscle) were infiltrated with a small amount of a short-acting local anesthetic, loaded on a 10cc syringe with a 25G, 1.5-in  Needle. An appropriate amount of time was allowed for local anesthetics to take effect before proceeding to the next step. Local Anesthetic: Lidocaine 2.0% The unused portion of the local anesthetic was discarded in the proper designated containers. Technical explanation of process:  Radiofrequency Ablation (RFA) L2 Medial Branch Nerve RFA: The target area for the L2 medial branch is at the junction of the postero-lateral aspect of the superior articular process and the superior, posterior, and medial edge of the transverse process of L3. Under fluoroscopic guidance, a Radiofrequency needle was inserted until contact was made with os over the superior postero-lateral aspect of the pedicular shadow (target area). Sensory and motor testing was conducted to properly adjust the position of the needle. Once satisfactory placement of the needle was achieved, the numbing solution was slowly injected after negative aspiration for blood. 2.0 mL of the nerve block solution was injected without difficulty or complication. After waiting for at least 3 minutes, the ablation was performed. Once completed, the needle was removed intact. L3 Medial Branch Nerve RFA: The target area for the L3 medial branch is at the junction of the postero-lateral aspect of the superior articular process and the superior, posterior, and medial edge of the transverse process of L4. Under fluoroscopic guidance, a Radiofrequency needle was inserted until contact was made with os over the superior postero-lateral aspect of the pedicular shadow (target area). Sensory and motor testing was conducted to properly adjust  the position of the needle. Once satisfactory placement of the needle was achieved, the numbing solution was slowly injected after negative aspiration for blood. 2.0 mL of the nerve block solution was injected without difficulty or complication. After waiting for at least 3 minutes, the ablation was performed. Once completed, the needle was removed intact. L4 Medial Branch Nerve RFA: The target area for the L4 medial branch is at the junction of the postero-lateral aspect of the superior articular process and the superior, posterior, and medial edge of the transverse process  of L5. Under fluoroscopic guidance, a Radiofrequency needle was inserted until contact was made with os over the superior postero-lateral aspect of the pedicular shadow (target area). Sensory and motor testing was conducted to properly adjust the position of the needle. Once satisfactory placement of the needle was achieved, the numbing solution was slowly injected after negative aspiration for blood. 2.0 mL of the nerve block solution was injected without difficulty or complication. After waiting for at least 3 minutes, the ablation was performed. Once completed, the needle was removed intact. L5 Medial Branch Nerve RFA: The target area for the L5 medial branch is at the junction of the postero-lateral aspect of the superior articular process of S1 and the superior, posterior, and medial edge of the sacral ala. Under fluoroscopic guidance, a Radiofrequency needle was inserted until contact was made with os over the superior postero-lateral aspect of the pedicular shadow (target area). Sensory and motor testing was conducted to properly adjust the position of the needle. Once satisfactory placement of the needle was achieved, the numbing solution was slowly injected after negative aspiration for blood. 2.0 mL of the nerve block solution was injected without difficulty or complication. After waiting for at least 3 minutes, the ablation was  performed. Once completed, the needle was removed intact. S1 Medial Branch Nerve RFA: The target area for the S1 medial branch is located inferior to the junction of the S1 superior articular process and the L5 inferior articular process, posterior, inferior, and lateral to the 6 o'clock position of the L5-S1 facet joint, just superior to the S1 posterior foramen. Under fluoroscopic guidance, the Radiofrequency needle was advanced until contact was made with os over the Target area. Sensory and motor testing was conducted to properly adjust the position of the needle. Once satisfactory placement of the needle was achieved, the numbing solution was slowly injected after negative aspiration for blood. 2.0 mL of the nerve block solution was injected without difficulty or complication. After waiting for at least 3 minutes, the ablation was performed. Once completed, the needle was removed intact. Radiofrequency lesioning (ablation):  Radiofrequency Generator: NeuroTherm [id number] Sensory Stimulation Parameters: 50 Hz was used to locate & identify the nerve, making sure that the needle was positioned such that there was no sensory stimulation below 0.3 V or above 0.7 V. Motor Stimulation Parameters: 2 Hz was used to evaluate the motor component. Care was taken not to lesion any nerves that demonstrated motor stimulation of the lower extremities at an output of less than 2.5 times that of the sensory threshold, or a maximum of 2.0 V. Lesioning Technique Parameters: Standard Radiofrequency settings. (Not bipolar or pulsed.) Temperature Settings: 80 degrees C Lesioning time: 60 seconds Intra-operative Compliance: Compliant Materials & Medications: Needle(s) (Electrode/Cannula) Type: Teflon-coated, curved tip, Radiofrequency needle(s) Gauge: 22G Length: 10cm Numbing solution: 0.2% PF-Ropivacaine + Triamcinolone (40 mg/mL) diluted to a final concentration of 4 mg of Triamcinolone/mL of Ropivacaine The unused  portion of the solution was discarded in the proper designated containers.  Once the entire procedure was completed, the treated area was cleaned, making sure to leave some of the prepping solution back to take advantage of its long term bactericidal properties.  Illustration of the posterior view of the lumbar spine and the posterior neural structures. Laminae of L2 through S1 are labeled. DPRL5, dorsal primary ramus of L5; DPRS1, dorsal primary ramus of S1; DPR3, dorsal primary ramus of L3; FJ, facet (zygapophyseal) joint L3-L4; I, inferior articular process of L4; LB1, lateral branch of  dorsal primary ramus of L1; IAB, inferior articular branches from L3 medial branch (supplies L4-L5 facet joint); IBP, intermediate branch plexus; MB3, medial branch of dorsal primary ramus of L3; NR3, third lumbar nerve root; S, superior articular process of L5; SAB, superior articular branches from L4 (supplies L4-5 facet joint also); TP3, transverse process of L3.  Vitals:   05/13/19 1130 05/13/19 1136 05/13/19 1145 05/13/19 1153  BP: 127/78 131/72 117/90 118/80  Pulse: 90     Resp: 13 14 14 18   Temp:  98.1 F (36.7 C)  97.9 F (36.6 C)  TempSrc:  Temporal  Temporal  SpO2: 98% 97% 97% 98%  Weight:      Height:       Start Time: 1103 hrs. End Time:   hrs.  Imaging Guidance (Spinal):          Type of Imaging Technique: Fluoroscopy Guidance (Spinal) Indication(s): Assistance in needle guidance and placement for procedures requiring needle placement in or near specific anatomical locations not easily accessible without such assistance. Exposure Time: Please see nurses notes. Contrast: None used. Fluoroscopic Guidance: I was personally present during the use of fluoroscopy. "Tunnel Vision Technique" used to obtain the best possible view of the target area. Parallax error corrected before commencing the procedure. "Direction-depth-direction" technique used to introduce the needle under continuous pulsed  fluoroscopy. Once target was reached, antero-posterior, oblique, and lateral fluoroscopic projection used confirm needle placement in all planes. Images permanently stored in EMR. Interpretation: No contrast injected. I personally interpreted the imaging intraoperatively. Adequate needle placement confirmed in multiple planes. Permanent images saved into the patient's record.  Antibiotic Prophylaxis:   Anti-infectives (From admission, onward)   None     Indication(s): None identified  Post-operative Assessment:  Post-procedure Vital Signs:  Pulse/HCG Rate: 9087 Temp: 97.9 F (36.6 C) Resp: 18 BP: 118/80 SpO2: 98 %  EBL: None  Complications: No immediate post-treatment complications observed by team, or reported by patient.  Note: The patient tolerated the entire procedure well. A repeat set of vitals were taken after the procedure and the patient was kept under observation following institutional policy, for this type of procedure. Post-procedural neurological assessment was performed, showing return to baseline, prior to discharge. The patient was provided with post-procedure discharge instructions, including a section on how to identify potential problems. Should any problems arise concerning this procedure, the patient was given instructions to immediately contact us, at any time, without hesitation. In any case, we plan to contact the patient by telephone for a follow-up status report regarding this interventional procedure.  Comments:  No additional relevant information.  Plan of Care  Orders:  Orders Placed This Encounter  Procedures  . Radiofrequency,Lumbar    Scheduling Instructions:     Side(s): Right-sided     Level: L3-4, L4-5, & L5-S1 Facets (L2, L3, L4, L5, & S1 Medial Branch Nerves)     Sedation: With Sedation     Timeframe: Today    Order Specific Question:   Where will this procedure be performed?    Answer:   [facility]  . [facility] C-ARM 1-60  MIN NO REPORT    Intraoperative interpretation by procedural physician at [place].    Standing Status:   Standing    Number of Occurrences:   1    Order Specific Question:   Reason for exam:    Answer:   Assistance in needle guidance and placement for procedures requiring needle placement in or near specific  anatomical locations not easily accessible without such assistance.  . Informed Consent Details: Physician/Practitioner Attestation; Transcribe to consent form and obtain patient signature    Nursing Order: Transcribe to consent form and obtain patient signature. Note: Always confirm laterality of pain with Ms. Theilen, before procedure. Procedure: Lumbar Facet Radiofrequency Ablation Indication/Reason: Low Back Pain, with our without leg pain, due to Facet Joint Arthralgia (Joint Pain) known as Lumbar Facet Syndrome, secondary to Lumbar, and/or Lumbosacral Spondylosis (Arthritis of the Spine), without myelopathy or radiculopathy (Nerve Damage). Provider Attestation: I, Felton Dossie Arbour, MD, (Pain Management Specialist), the physician/practitioner, attest that I have discussed with the patient the benefits, risks, side effects, alternatives, likelihood of achieving goals and potential problems during recovery for the procedure that I have provided informed consent.  . Provide equipment / supplies at bedside    Equipment required: Sterile "Radiofrequency Tray"; Large hemostat (1); Small hemostat (1); Towels (6-8); 4x4 sterile sponge pack (1) Radiofrequency Needle(s): Size: Long Quantity: 5    Standing Status:   Standing    Number of Occurrences:   1    Order Specific Question:   Specify    Answer:   Radiofrequency Tray  . Latex precautions    Activate Latex-Free Protocol.    Standing Status:   Standing    Number of Occurrences:   1   Chronic Opioid Analgesic:  Oxycodone IR 5 mg, 1 tab PO q 6 hrs (20 mg/day of oxycodone) MME/day:30 mg/day.   Medications ordered for  procedure: Meds ordered this encounter  Medications  . lidocaine (XYLOCAINE) 2 % (with pres) injection 400 mg  . lactated ringers infusion 1,000 mL  . midazolam (VERSED) 5 MG/5ML injection 1-2 mg    Make sure Flumazenil is available in the pyxis when using this medication. If oversedation occurs, administer 0.2 mg IV over 15 sec. If after 45 sec no response, administer 0.2 mg again over 1 min; may repeat at 1 min intervals; not to exceed 4 doses (1 mg)  . fentaNYL (SUBLIMAZE) injection 25-50 mcg    Make sure Narcan is available in the pyxis when using this medication. In the event of respiratory depression (RR< 8/min): Titrate NARCAN (naloxone) in increments of 0.1 to 0.2 mg IV at 2-3 minute intervals, until desired degree of reversal.  . ropivacaine (PF) 2 mg/mL (0.2%) (NAROPIN) injection 9 mL  . triamcinolone acetonide (KENALOG-40) injection 40 mg  . HYDROcodone-acetaminophen (NORCO/VICODIN) 5-325 MG tablet    Sig: Take 1 tablet by mouth every 8 (eight) hours as needed for up to 7 days for severe pain. Must last 7 days.    Dispense:  21 tablet    Refill:  0    For acute post-operative pain. Not to be refilled. Must last 7 days.  [name] HYDROcodone-acetaminophen (NORCO/VICODIN) 5-325 MG tablet    Sig: Take 1 tablet by mouth every 8 (eight) hours as needed for up to 7 days for severe pain. Must last for 7 days.    Dispense:  21 tablet    Refill:  0    For acute post-operative pain. Not to be refilled. Must last 7 days.   Medications administered: We administered lidocaine, lactated ringers, midazolam, fentaNYL, ropivacaine (PF) 2 mg/mL (0.2%), and triamcinolone acetonide.  See the medical record for exact dosing, route, and time of administration.  Follow-up plan:   Return in about 6 weeks (around 06/24/2019) for (VV), (PP).       Interventional treatment options:  Under consideration:   Diagnostic right suprascapular  nerve block #1  Repeat therapeutic bilateral lumbar facet RFA     Therapeutic/palliative (PRN):   Palliative bilateral lumbar facet blocks  Palliative right lumbar facet RFA #2 (last done on 12/01/2017) (by Dr. Holley Raring)  Palliative left lumbar facet RFA #2 (last done on 09/22/2017) (by Dr. Holley Raring)  Palliative left C7-T1 CESI  PRN SCS programming adjustments (2018 replacement by Dr. Clydell Hakim)      Recent Visits Date Type Provider Dept  04/08/19 Procedure visit Milinda Pointer, MD [facility]  03/10/19 Telemedicine Milinda Pointer, [facility]  02/25/19 Procedure visit Milinda Pointer, MD [facility]  02/17/19 Telemedicine Milinda Pointer, MD [facility]  Showing recent visits within past 90 days and meeting all other requirements   Today's Visits Date Type Provider Dept  05/13/19 Procedure visit Milinda Pointer, MD [facility]  Showing today's visits and meeting all other requirements   Future Appointments Date Type Provider Dept  06/21/19 Appointment Milinda Pointer, MD [facility]  Showing future appointments within next 90 days and meeting all other requirements   Disposition: Discharge home  Discharge (Date  Time): 05/13/2019; 1159 hrs.   Primary Care Physician: Emeterio Reeve, DO Location: [facility] Note by: Gaspar Cola, MD Date: 05/13/2019; Time: 2:31 PM  Disclaimer:  Medicine is not an Chief Strategy Officer. The only guarantee in medicine is that nothing is guaranteed. It is important to note that the decision to proceed with this intervention was based on the information collected from the patient. The Data and conclusions were drawn from the patient's questionnaire, the interview, and the physical examination. Because the information was provided in large part by the patient, it cannot be guaranteed that it has not been purposely or unconsciously manipulated. Every effort has been made to obtain as much relevant data as  possible for this evaluation. It is important to note that the conclusions that lead to this procedure are derived in large part from the available data. Always take into account that the treatment will also be dependent on availability of resources and existing treatment guidelines, considered by other Pain Management Practitioners as being common knowledge and practice, at the time of the intervention. For Medico-Legal purposes, it is also important to point out that variation in procedural techniques and pharmacological choices are the acceptable norm. The indications, contraindications, technique, and results of the above procedure should only be interpreted and judged by a Board-Certified Interventional Pain Specialist with extensive familiarity and expertise in the same exact procedure and technique.

## 2019-05-13 NOTE — Progress Notes (Signed)
Safety precautions to be maintained throughout the outpatient stay will include: orient to surroundings, keep bed in low position, maintain call bell within reach at all times, provide assistance with transfer out of bed and ambulation.  

## 2019-05-13 NOTE — Telephone Encounter (Signed)
Verified that we wrote her for the hydrocodone for post RF pain . Her insurance is refusing to pay for another narcotic this soon after filling the oxycodone. I informed the pharmacy that there is nothing I can do about that. She will offer for the patient to pay out of pocket.

## 2019-05-13 NOTE — Patient Instructions (Addendum)
___________________________________________________________________________________________  Post-Radiofrequency (RF) Discharge Instructions  You have just completed a Radiofrequency Neurotomy.  The following instructions will provide you with information and guidelines for self-care upon discharge.  If at any time you have questions or concerns please call your physician. DO NOT DRIVE YOURSELF!!  Instructions:  Apply ice: Fill a plastic sandwich bag with crushed ice. Cover it with a small towel and apply to injection site. Apply for 15 minutes then remove x 15 minutes. Repeat sequence on day of procedure, until you go to bed. The purpose is to minimize swelling and discomfort after procedure.  Apply heat: Apply heat to procedure site starting the day following the procedure. The purpose is to treat any soreness and discomfort from the procedure.  Food intake: No eating limitations, unless stipulated above.  Nevertheless, if you have had sedation, you may experience some nausea.  In this case, it may be wise to wait at least two hours prior to resuming regular diet.  Physical activities: Keep activities to a minimum for the first 8 hours after the procedure. For the first 24 hours after the procedure, do not drive a motor vehicle,  Operate heavy machinery, power tools, or handle any weapons.  Consider walking with the use of an assistive device or accompanied by an adult for the first 24 hours.  Do not drink alcoholic beverages including beer.  Do not make any important decisions or sign any legal documents. Go home and rest today.  Resume activities tomorrow, as tolerated.  Use caution in moving about as you may experience mild leg weakness.  Use caution in cooking, use of household electrical appliances and climbing steps.  Driving: If you have received any sedation, you are not allowed to drive for 24 hours after your procedure.  Blood thinner: Restart your blood thinner 6 hours after your  procedure. (Only for those taking blood thinners)  Insulin: As soon as you can eat, you may resume your normal dosing schedule. (Only for those taking insulin)  Medications: May resume pre-procedure medications.  Do not take any drugs, other than what has been prescribed to you.  Infection prevention: Keep procedure site clean and dry.  Post-procedure Pain Diary: Extremely important that this be done correctly and accurately. Recorded information will be used to determine the next step in treatment.  Pain evaluated is that of treated area only. Do not include pain from an untreated area.  Complete every hour, on the hour, for the initial 8 hours. Set an alarm to help you do this part accurately.  Do not go to sleep and have it completed later. It will not be accurate.  Follow-up appointment: Keep your follow-up appointment after the procedure. Usually 2-6 weeks after radiofrequency. Bring you pain diary. The information collected will be essential for your long-term care.   Expect:  From numbing medicine (AKA: Local Anesthetics): Numbness or decrease in pain.  Onset: Full effect within 15 minutes of injected.  Duration: It will depend on the type of local anesthetic used. On the average, 1 to 8 hours.   From steroids (when added): Decrease in swelling or inflammation. Once inflammation is improved, relief of the pain will follow.  Onset of benefits: Depends on the amount of swelling present. The more swelling, the longer it will take for the benefits to be seen. In some cases, up to 10 days.  Duration: Steroids will stay in the system x 2 weeks. Duration of benefits will depend on multiple posibilities including persistent irritating factors.    From procedure: Some discomfort is to be expected once the numbing medicine wears off. In the case of radiofrequency procedures, this may last as long as 6 weeks. Additional post-procedure pain medication is provided for this. Discomfort is  minimized if ice and heat are applied as instructed.  Call if:  You experience numbness and weakness that gets worse with time, as opposed to wearing off.  He experience any unusual bleeding, difficulty breathing, or loss of the ability to control your bowel and bladder. (This applies to Spinal procedures only)  You experience any redness, swelling, heat, red streaks, elevated temperature, fever, or any other signs of a possible infection.  Emergency Numbers:  [place] hours (Monday - Thursday, 8:00 AM - 4:00 PM) (Friday, 9:00 AM - 12:00 Noon): [phone]  After hours: [phone] ____________________________________________________________________________________________    ______________________________________________________________________________________________  Weight Management Required  URGENT: Your weight has been found to be adversely affecting your health.  Dear Mary Cruz:  Your current Estimated body mass index is 47.2 kg/m as calculated from the following:   Height as of this encounter: 5' 4"  (1.626 m).   Weight as of this encounter: 275 lb (124.7 kg).  Please use the table below to identify your weight category and associated incidence of chronic pain, secondary to your weight.  Body Mass Index (BMI) Classification BMI level (kg/m2) Category Associated incidence of chronic pain  <18  Underweight   18.5-24.9 Ideal body weight   25-29.9 Overweight  20%  30-34.9 Obese (Class I)  68%  35-39.9 Severe obesity (Class II)  136%  >40 Extreme obesity (Class III)  254%   In addition: You will be considered "Morbidly Obese", if your BMI is above 30 and you have one or more of the following conditions which are known to be caused and/or directly associated with obesity: 1.    Type 2 Diabetes (Which in turn can lead to cardiovascular diseases (CVD), stroke, peripheral vascular diseases (PVD), retinopathy, nephropathy, and neuropathy) 2.     Cardiovascular Disease (High Blood Pressure; Congestive Heart Failure; High Cholesterol; Coronary Artery Disease; Angina; or History of Heart Attacks) 3.    Breathing problems (Asthma; obesity-hypoventilation syndrome; obstructive sleep apnea; chronic inflammatory airway disease; reactive airway disease; or shortness of breath) 4.    Chronic kidney disease 5.    Liver disease (nonalcoholic fatty liver disease) 6.    High blood pressure 7.    Acid reflux (gastroesophageal reflux disease; heartburn) 8.    Osteoarthritis (OA) (with any of the following: hip pain; knee pain; and/or low back pain) 9.    Low back pain (Lumbar Facet Syndrome; and/or Degenerative Disc Disease) 10.  Hip pain (Osteoarthritis of hip) (For every 1 lbs of added body weight, there is a 2 lbs increase in pressure inside of each hip articulation. 1:2 mechanical relationship) 11.  Knee pain (Osteoarthritis of knee) (For every 1 lbs of added body weight, there is a 4 lbs increase in pressure inside of each knee articulation. 1:4 mechanical relationship) (patients with a BMI>30 kg/m2 were 6.8 times more likely to develop knee OA than normal-weight individuals) 12.  Cancer: Epidemiological studies have shown that obesity is a risk factor for: post-menopausal breast cancer; cancers of the endometrium, colon and kidney cancer; malignant adenomas of the oesophagus. Obese subjects have an approximately 1.5-3.5-fold increased risk of developing these cancers compared with normal-weight subjects, and it has been estimated that between 15 and 45% of these cancers can be attributed to overweight. More recent studies  suggest that obesity may also increase the risk of other types of cancer, including pancreatic, hepatic and gallbladder cancer. (Ref: Obesity and cancer. Pischon T, Nthlings U, [employer] H. Proc Nutr Soc. 2008 May;67(2):128-45. doi: 65.4650/P5465681275170017.) The International Agency for Research on Cancer (IARC) has identified 13 cancers  associated with overweight and obesity: meningioma, multiple myeloma, adenocarcinoma of the esophagus, and cancers of the thyroid, postmenopausal breast cancer, gallbladder, stomach, liver, pancreas, kidney, ovaries, uterus, colon and rectal (colorectal) cancers. 58 percent of all cancers diagnosed in women and 24 percent of those diagnosed in men are associated with overweight and obesity.  Recommendation: At this point it is urgent that you take a step back and concentrate in loosing weight. Dedicate 100% of your efforts on this task. Nothing else will improve your health more than bringing your weight down and your BMI to less than 30. If you are here, you probably have chronic pain. Because most chronic pain patients have difficulty exercising secondary to their pain, you must rely on proper nutrition and diet in order to lose the weight. If your BMI is above 40, you should seriously consider bariatric surgery. A realistic goal is to lose 10% of your body weight over a period of 12 months.  Be honest to yourself, if over time you have unsuccessfully tried to lose weight, then it is time for you to seek professional help and to enter a medically supervised weight management program, and/or undergo bariatric surgery. Stop procrastinating.   Pain management considerations:  1.    Pharmacological Problems: Be advised that the use of opioid analgesics (oxycodone; hydrocodone; morphine; methadone; codeine; and all of their derivatives) have been associated with decreased metabolism and weight gain.  For this reason, should we see that you are unable to lose weight while taking these medications, it may become necessary for [place] to taper down and indefinitely discontinue them.  2.    Technical Problems: The incidence of successful interventional therapies decreases as the patient's BMI increases. It is much more difficult to accomplish a safe and effective interventional therapy on a patient with a BMI above  35. 3.    Radiation Exposure Problems: The x-rays machine, used to accomplish injection therapies, will automatically increase their x-ray output in order to capture an appropriate bone image. This means that radiation exposure increases exponentially with the patient's BMI. (The higher the BMI, the higher the radiation exposure.) Although the level of radiation used at a given time is still safe to the patient, it is not for the physician and/or assisting staff. Unfortunately, radiation exposure is accumulative. Because physicians and the staff have to do procedures and be exposed on a daily basis, this can result in health problems such as cancer and radiation burns. Radiation exposure to the staff is monitored by the radiation batches that they wear. The exposure levels are reported back to the staff on a quarterly basis. Depending on levels of exposure, physicians and staff may be obligated by law to decrease this exposure. This means that they have the right and obligation to refuse providing therapies where they may be overexposed to radiation. For this reason, physicians may decline to offer therapies such as radiofrequency ablation or implants to patients with a BMI above 40. 4.    Current Trends: Be advised that the current trend is to no longer offer certain therapies to patients with a BMI equal to, or above 35, due to increase perioperative risks, increased technical procedural difficulties, and excessive radiation exposure to  healthcare personnel.  ______________________________________________________________________________________________

## 2019-05-14 ENCOUNTER — Telehealth: Payer: Self-pay | Admitting: *Deleted

## 2019-05-14 NOTE — Telephone Encounter (Signed)
Voicemail left with patient that if she has any questions or concerns to please call our office.

## 2019-05-17 ENCOUNTER — Ambulatory Visit: Payer: BLUE CROSS/BLUE SHIELD | Admitting: Osteopathic Medicine

## 2019-05-19 ENCOUNTER — Telehealth: Payer: BLUE CROSS/BLUE SHIELD | Admitting: Pain Medicine

## 2019-05-20 ENCOUNTER — Other Ambulatory Visit: Payer: Self-pay | Admitting: Nurse Practitioner

## 2019-05-20 DIAGNOSIS — F419 Anxiety disorder, unspecified: Secondary | ICD-10-CM

## 2019-05-20 DIAGNOSIS — F329 Major depressive disorder, single episode, unspecified: Secondary | ICD-10-CM

## 2019-05-20 DIAGNOSIS — F32A Depression, unspecified: Secondary | ICD-10-CM

## 2019-05-20 MED ORDER — DULOXETINE HCL 60 MG PO CPEP: 60.0000 mg | ORAL_CAPSULE | Freq: Every day | ORAL | 3 refills | Status: DC

## 2019-05-24 ENCOUNTER — Telehealth: Payer: Self-pay | Admitting: Nurse Practitioner

## 2019-05-24 NOTE — Telephone Encounter (Signed)
Mary Cruz, Mary Cruz has been on Cymbalta 60 mg twice a day since 05/05/2015. Dr. Ileene Rubens increased the dosage at this time due to increased anxiety not controlled with once a day 60 mg. After the dosage increase, she has been well controlled.   The patients insurance has not approved the dosage on the most recent refill requests. Can we please try to get a prior authorization from insurance so we can continue the dosage that has been effective for her? The last refill sent in was only for the once a day dosage so that we could at least get her medication to her, but it is in her best interest to continue the high dose medication.  Let me know if you need anything from me. Thank you

## 2019-05-26 ENCOUNTER — Ambulatory Visit: Payer: BLUE CROSS/BLUE SHIELD | Admitting: Gastroenterology

## 2019-05-26 NOTE — Telephone Encounter (Signed)
I called insurance and started PA process waiting on them to fax form that needs filled out and I will complete - CF

## 2019-05-27 ENCOUNTER — Other Ambulatory Visit: Payer: Self-pay

## 2019-05-27 ENCOUNTER — Ambulatory Visit: Payer: BLUE CROSS/BLUE SHIELD | Admitting: Medical-Surgical

## 2019-05-27 ENCOUNTER — Telehealth: Payer: Self-pay | Admitting: Osteopathic Medicine

## 2019-05-27 ENCOUNTER — Ambulatory Visit (INDEPENDENT_AMBULATORY_CARE_PROVIDER_SITE_OTHER): Payer: BLUE CROSS/BLUE SHIELD | Admitting: Nurse Practitioner

## 2019-05-27 ENCOUNTER — Ambulatory Visit: Payer: BLUE CROSS/BLUE SHIELD | Admitting: Osteopathic Medicine

## 2019-05-27 ENCOUNTER — Encounter: Payer: Self-pay | Admitting: Nurse Practitioner

## 2019-05-27 VITALS — BP 114/71 | HR 106 | Wt 270.0 lb

## 2019-05-27 DIAGNOSIS — F419 Anxiety disorder, unspecified: Secondary | ICD-10-CM

## 2019-05-27 DIAGNOSIS — F329 Major depressive disorder, single episode, unspecified: Secondary | ICD-10-CM

## 2019-05-27 DIAGNOSIS — F32A Depression, unspecified: Secondary | ICD-10-CM

## 2019-05-27 DIAGNOSIS — G43009 Migraine without aura, not intractable, without status migrainosus: Secondary | ICD-10-CM | POA: Diagnosis not present

## 2019-05-27 DIAGNOSIS — Z6841 Body Mass Index (BMI) 40.0 and over, adult: Secondary | ICD-10-CM | POA: Diagnosis not present

## 2019-05-27 MED ORDER — PHENTERMINE HCL 37.5 MG PO CAPS: 37.5000 mg | ORAL_CAPSULE | ORAL | 3 refills | Status: DC

## 2019-05-27 MED ORDER — DULOXETINE HCL 60 MG PO CPEP: 120.0000 mg | ORAL_CAPSULE | Freq: Every day | ORAL | 3 refills | Status: DC

## 2019-05-27 NOTE — Progress Notes (Signed)
Established Patient Office Visit  Subjective:  Patient ID: Mary Cruz, female    DOB: 26-Nov-1975  Age: 44 y.o. MRN: [id number]  CC:  Chief Complaint  Patient presents with  . Follow-up    HPI Mary Cruz presents for follow-up for mood, migraines, and weight loss.    Mood She reports things have improved significantly in her family life.  Her husband has started a new job and their financial status has improved significantly.  She feels a big stress relief with that.  She also reports that she is sleeping much better at night after changing her bedtime routine.  She has had some recent health concerns and a hospitalization for sepsis and acute kidney injury related to an intestinal infection.  She is currently taking antibiotics for this but feels she is improving significantly.  She does have a colonoscopy scheduled for 6 with Dr. Jacelyn Grip that at [facility].   Weight Loss She has been taking the phentermine and feels it is helping with her weight loss goals.  She is down 5 pounds in 1 month.  She reports decreased hunger and increased desire for water.  She has cut out soda and other sugary beverages and is focusing on a low-fat diet to help with her weight loss.  She said she most significantly notices that her clothes are fitting larger and she is fitting into spaces better.  She is very happy with the medication and would like to continue at this time.  She denies difficulty sleeping, chest pain, palpitations, elevated blood pressure, nervous/anxious mood.  Migraines She reports the Maxalt has been extremely effective for her migraine headaches.  She reports that when she does get a migraine only 1 tablet will take the pain away significantly.  She denies any noted side effects from the medication and would like to continue on this.  Depression screen [facility] 2/9 05/27/2019 05/13/2019 04/28/2019 04/15/2019 04/08/2019  Decreased Interest 0 0 0 2 0  Down, Depressed, Hopeless 1 0 0 2 0  PHQ - 2  Score 1 0 0 4 0  Altered sleeping 1 - 0 3 -  Tired, decreased energy 2 - 1 3 -  Change in appetite 0 - 0 3 -  Feeling bad or failure about yourself  0 - 0 1 -  Trouble concentrating 0 - 0 3 -  Moving slowly or fidgety/restless 0 - 0 2 -  Suicidal thoughts 0 - 0 0 -  PHQ-9 Score 4 - 1 19 -  Difficult doing work/chores Somewhat difficult - Not difficult at all Very difficult -  Some recent data might be hidden     Past Medical History:  Diagnosis Date  . Abscess of axillary fold 10/10/2013  . Acute bronchitis with asthma with acute exacerbation 01/23/2015  . Dehydration 04/19/2019  . Elevated lactic acid level 04/19/2019  . Encounter for management of implanted device 12/19/2014  . Encounter for therapeutic drug level monitoring 12/19/2014  . Fibromyalgia   . Hyperglycemia 04/19/2019  . Hypertension   . Implant pain at battery site (right buttocks area) 04/05/2015  . KIDNEY STONES 07/02/2007   Qualifier: History of  By: Bobby Rumpf CMA (AAMA), Patty    . Long term current use of opiate analgesic 12/19/2014  . Lupus ([place])   . Morbid obesity ([place]) 12/19/2014  . Muscle cramping 12/19/2014  . Opiate use (30 MME/Day) 12/19/2014  . Osteoarthritis   . Pain    chronic regional pain syndrome  . Right knee  pain 01/10/2015  . Sepsis ([place]) 04/19/2019  . Sepsis affecting skin 10/10/2013  . Tobacco abuse 04/19/2019    Past Surgical History:  Procedure Laterality Date  . BREAST SURGERY    . CHOLECYSTECTOMY  2000  . ENDOMETRIAL ABLATION    . KNEE SURGERY    . KNEE SURGERY Right 02-2015  . OCCIPITAL NERVE STIMULATOR INSERTION    . SHOULDER ARTHROSCOPY WITH DISTAL CLAVICLE RESECTION Right 10/29/2018   Procedure: SHOULDER ARTHROSCOPY WITH DISTAL CLAVICLE RESECTION;  Surgeon: Hiram Gash, MD;  Location: [facility];  Service: Orthopedics;  Laterality: Right;  . SHOULDER SURGERY    . spinal cord stimulator    . SPINAL CORD STIMULATOR IMPLANT  2008  . SPINAL CORD STIMULATOR INSERTION N/A 06/21/2016    Procedure: LUMBAR SPINAL CORD STIMULATOR INSERTION;  Surgeon: Clydell Hakim, MD;  Location: [facility];  Service: Neurosurgery;  Laterality: N/A;  LUMBAR SPINAL CORD STIMULATOR INSERTION  . SUBACROMIAL DECOMPRESSION Right 10/29/2018   Procedure: SUBACROMIAL DECOMPRESSION;  Surgeon: Hiram Gash, MD;  Location: [facility];  Service: Orthopedics;  Laterality: Right;  . TUBAL LIGATION  2000    Family History  Problem Relation Age of Onset  . Depression Mother        maternal grandmother  . Asthma Mother   . Fibromyalgia Other        aunt  . Stroke Other        grandmother  . Thyroid disease Other        grandmother    Social History   Socioeconomic History  . Marital status: Married    Spouse name: Not on file  . Number of children: Not on file  . Years of education: Not on file  . Highest education level: Not on file  Occupational History  . Not on file  Tobacco Use  . Smoking status: Former Smoker    Types: Cigarettes  . Smokeless tobacco: Former Systems developer    Quit date: 12/22/2017  Substance and Sexual Activity  . Alcohol use: No    Alcohol/week: 0.0 standard drinks  . Drug use: No  . Sexual activity: Yes    Partners: Male    Birth control/protection: Surgical  Other Topics Concern  . Not on file  Social History Narrative  . Not on file   Social Determinants of Health   Financial Resource Strain:   . Difficulty of Paying Living Expenses:   Food Insecurity:   . Worried About Charity fundraiser in the Last Year:   . Arboriculturist in the Last Year:   Transportation Needs:   . Film/video editor (Medical):   [name] Lack of Transportation (Non-Medical):   Physical Activity:   . Days of Exercise per Week:   . Minutes of Exercise per Session:   Stress:   . Feeling of Stress :   Social Connections:   . Frequency of Communication with Friends and Family:   . Frequency of Social Gatherings with Friends and Family:   . Attends Religious Services:   .  Active Member of Clubs or Organizations:   . Attends Archivist Meetings:   [name] Marital Status:   Intimate Partner Violence:   . Fear of Current or Ex-Partner:   . Emotionally Abused:   [name] Physically Abused:   . Sexually Abused:     Outpatient Medications Prior to Visit  Medication Sig Dispense Refill  . albuterol (PROVENTIL HFA;VENTOLIN HFA) 108 (90 Base) MCG/ACT inhaler  Inhale 1-2 puffs into the lungs every 6 (six) hours as needed for wheezing or shortness of breath. 1 Inhaler 0  . clonazePAM (KLONOPIN) 0.5 MG tablet Take 0.5-1 tablets (0.25-0.5 mg total) by mouth 2 (two) times daily as needed for anxiety. 30 tablet 0  . HYDROcodone-acetaminophen (NORCO/VICODIN) 5-325 MG tablet Take 1 tablet by mouth every 8 (eight) hours as needed for up to 7 days for severe pain. Must last for 7 days. 21 tablet 0  . lisinopril-hydrochlorothiazide (ZESTORETIC) 10-12.5 MG tablet Take 1 tablet by mouth daily. 90 tablet 3  . methocarbamol (ROBAXIN) 750 MG tablet Take 1 tablet (750 mg total) by mouth every 8 (eight) hours as needed for muscle spasms. 90 tablet 5  . naloxegol oxalate (MOVANTIK) 12.5 MG TABS tablet Take 1 tablet (12.5 mg total) by mouth daily. (Patient not taking: Reported on 05/13/2019) 90 tablet 0  . omeprazole (PRILOSEC) 40 MG capsule Take 1 capsule (40 mg total) by mouth daily. 90 capsule 3  . oxyCODONE (OXY IR/ROXICODONE) 5 MG immediate release tablet Take 1 tablet (5 mg total) by mouth every 6 (six) hours as needed for severe pain. Must last 30 days 120 tablet 0  . oxyCODONE (OXY IR/ROXICODONE) 5 MG immediate release tablet Take 1 tablet (5 mg total) by mouth every 6 (six) hours as needed for severe pain. Must last 30 days 120 tablet 0  . oxyCODONE (OXY IR/ROXICODONE) 5 MG immediate release tablet Take 1 tablet (5 mg total) by mouth every 6 (six) hours as needed for severe pain. Must last 30 days 120 tablet 0  . rizatriptan (MAXALT) 10 MG tablet Take 1 tablet (10 mg total) by mouth as  needed for migraine. May repeat in 2 hours if needed 10 tablet 12  . DULoxetine (CYMBALTA) 60 MG capsule Take 1 capsule (60 mg total) by mouth daily. 90 capsule 3  . phentermine 37.5 MG capsule Take 1 capsule (37.5 mg total) by mouth every morning. (Patient not taking: Reported on 05/13/2019) 30 capsule 0   No facility-administered medications prior to visit.    Allergies  Allergen Reactions  . Gabapentin Swelling  . Celebrex [Celecoxib] Swelling    "Body swelling"  . Pentazocine Lactate Itching and Other (See Comments)    HALLUCINATIONS  . Seroquel [Quetiapine] Other (See Comments)    Falls  . Amoxicillin Diarrhea and Nausea And Vomiting  . Bactrim [Sulfamethoxazole-Trimethoprim] Other (See Comments)    "Makes very sick"  . Doxycycline Other (See Comments)    "SICKNESS"  . Erythromycin Other (See Comments)    RESULTANT UTI  . Hydromorphone Hcl Other (See Comments)    HALLUCINATIONS  . Keflex [Cephalexin] Other (See Comments)    Nausea and causes uti's  . Latex Itching  . Lyrica [Pregabalin] Other (See Comments)    "Too sleepy"  . Nabumetone Itching    ROS Review of Systems  Constitutional: Positive for appetite change. Negative for activity change, fatigue, fever and unexpected weight change.  Respiratory: Negative for cough, choking, chest tightness and shortness of breath.   Cardiovascular: Negative for chest pain, palpitations and leg swelling.  Gastrointestinal: Positive for abdominal pain and constipation. Negative for diarrhea, nausea and vomiting.  Neurological: Negative for dizziness, tremors, weakness, light-headedness and headaches.  Psychiatric/Behavioral: Negative for decreased concentration, dysphoric mood, sleep disturbance and suicidal ideas. The patient is not nervous/anxious.       Objective:    Physical Exam  Constitutional: She is oriented to person, place, and time. She appears well-developed and  well-nourished.  HENT:  Head: Normocephalic.  Eyes:  Conjunctivae and EOM are normal.  Neck: No JVD present.  Cardiovascular: Normal rate, regular rhythm and normal heart sounds.  Pulmonary/Chest: Effort normal and breath sounds normal.  Abdominal: There is abdominal tenderness.  Musculoskeletal:        General: Normal range of motion.     Cervical back: Normal range of motion.  Neurological: She is alert and oriented to person, place, and time.  Skin: Skin is warm and dry.  Psychiatric: She has a normal mood and affect. Her behavior is normal. Judgment and thought content normal.  Nursing note and vitals reviewed.   BP 114/71   Pulse (!) 106   Wt 270 lb (122.5 kg)   BMI 46.35 kg/m  Wt Readings from Last 3 Encounters:  05/27/19 270 lb (122.5 kg)  05/13/19 275 lb (124.7 kg)  04/28/19 275 lb (124.7 kg)     There are no preventive care reminders to display for this patient.  There are no preventive care reminders to display for this patient.  Lab Results  Component Value Date   TSH 0.623 04/20/2019   Lab Results  Component Value Date   WBC 15.5 (H) 04/21/2019   HGB 11.0 (L) 04/21/2019   HCT 35.6 (L) 04/21/2019   MCV 93.9 04/21/2019   PLT 246 04/21/2019   Lab Results  Component Value Date   NA 137 04/21/2019   K 3.3 (L) 04/21/2019   CO2 26 04/21/2019   GLUCOSE 99 04/21/2019   BUN 13 04/21/2019   CREATININE 0.87 04/21/2019   BILITOT 0.9 04/20/2019   ALKPHOS 88 04/20/2019   AST 20 04/20/2019   ALT 19 04/20/2019   PROT 5.4 (L) 04/20/2019   ALBUMIN 2.7 (L) 04/20/2019   CALCIUM 8.4 (L) 04/21/2019   ANIONGAP 7 04/21/2019   No results found for: CHOL No results found for: HDL No results found for: LDLCALC No results found for: TRIG No results found for: CHOLHDL Lab Results  Component Value Date   HGBA1C 5.7 (H) 04/20/2019      Assessment & Plan:   1. Anxiety and depression Patient's anxiety and depression is much improved from last visit.  We are still working on getting prior authorization for the usual  dose of 120 mg total of Cymbalta per day as this has been most effective for her since 2017.  At this time she is currently taking 60 mg daily. Plan to follow-up in approximately 6 months on mood - DULoxetine (CYMBALTA) 60 MG capsule; Take 2 capsules (120 mg total) by mouth daily.  Dispense: 90 capsule; Refill: 3  2. BMI 45.0-49.9, adult [facility]) She has done exceptionally well working on her weight loss plan.  She has currently lost 5 pounds in the last month and feels that the medication has helped her significantly to do this.  Feel it is completely reasonable to continue the medication at this time as she is seeing more inches lost than actual pounds but overall she feels healthier. Prescription for phentermine sent to pharmacy with 3 refills available. Plan to follow-up in approximately 3 months for weight. - phentermine 37.5 MG capsule; Take 1 capsule (37.5 mg total) by mouth every morning.  Dispense: 30 capsule; Refill: 3  3. Migraine without aura and without status migrainosus, not intractable Patient's migraines are well controlled with prescription Maxalt.  Plan to continue medication as needed for migraine headaches.  Patient instructed to notify the office if this medication is no longer working.  Follow-up as needed.  Return in about 3 months (around 08/26/2019) for weight loss.   Orma Render, NP

## 2019-05-27 NOTE — Telephone Encounter (Signed)
Received fax for PA on [employer] called insurance started PA process received faxed form filled out and obtained providers signature faxed back and  now waiting on determination. - CF

## 2019-05-27 NOTE — Telephone Encounter (Signed)
Received forms filled them out received providers signature and faxed back to insurance now waiting on determination. - CF

## 2019-05-27 NOTE — Patient Instructions (Signed)
Exercising to Lose Weight Exercise is structured, repetitive physical activity to improve fitness and health. Getting regular exercise is important for everyone. It is especially important if you are overweight. Being overweight increases your risk of heart disease, stroke, diabetes, high blood pressure, and several types of cancer. Reducing your calorie intake and exercising can help you lose weight. Exercise is usually categorized as moderate or vigorous intensity. To lose weight, most people need to do a certain amount of moderate-intensity or vigorous-intensity exercise each week. Moderate-intensity exercise  Moderate-intensity exercise is any activity that gets you moving enough to burn at least three times more energy (calories) than if you were sitting. Examples of moderate exercise include:  Walking a mile in 15 minutes.  Doing light yard work.  Biking at an easy pace. Most people should get at least 150 minutes (2 hours and 30 minutes) a week of moderate-intensity exercise to maintain their body weight. Vigorous-intensity exercise Vigorous-intensity exercise is any activity that gets you moving enough to burn at least six times more calories than if you were sitting. When you exercise at this intensity, you should be working hard enough that you are not able to carry on a conversation. Examples of vigorous exercise include:  Running.  Playing a team sport, such as football, basketball, and soccer.  Jumping rope. Most people should get at least 75 minutes (1 hour and 15 minutes) a week of vigorous-intensity exercise to maintain their body weight. How can exercise affect me? When you exercise enough to burn more calories than you eat, you lose weight. Exercise also reduces body fat and builds muscle. The more muscle you have, the more calories you burn. Exercise also:  Improves mood.  Reduces stress and tension.  Improves your overall fitness, flexibility, and endurance.   Increases bone strength. The amount of exercise you need to lose weight depends on:  Your age.  The type of exercise.  Any health conditions you have.  Your overall physical ability. Talk to your health care provider about how much exercise you need and what types of activities are safe for you. What actions can I take to lose weight? Nutrition   Make changes to your diet as told by your health care provider or diet and nutrition specialist (dietitian). This may include: ? Eating fewer calories. ? Eating more protein. ? Eating less unhealthy fats. ? Eating a diet that includes fresh fruits and vegetables, whole grains, low-fat dairy products, and lean protein. ? Avoiding foods with added fat, salt, and sugar.  Drink plenty of water while you exercise to prevent dehydration or heat stroke. Activity  Choose an activity that you enjoy and set realistic goals. Your health care provider can help you make an exercise plan that works for you.  Exercise at a moderate or vigorous intensity most days of the week. ? The intensity of exercise may vary from person to person. You can tell how intense a workout is for you by paying attention to your breathing and heartbeat. Most people will notice their breathing and heartbeat get faster with more intense exercise.  Do resistance training twice each week, such as: ? Push-ups. ? Sit-ups. ? Lifting weights. ? Using resistance bands.  Getting short amounts of exercise can be just as helpful as long structured periods of exercise. If you have trouble finding time to exercise, try to include exercise in your daily routine. ? Get up, stretch, and walk around every 30 minutes throughout the day. ? Go for a   walk during your lunch break. ? Park your car farther away from your destination. ? If you take public transportation, get off one stop [place] and walk the rest of the way. ? Make phone calls while standing up and walking  around. ? Take the stairs instead of elevators or escalators.  Wear comfortable clothes and shoes with good support.  Do not exercise so much that you hurt yourself, feel dizzy, or get very short of breath. Where to find more information  [place] Department of Health and Human Services: www.hhs.gov  Centers for Disease Control and Prevention (CDC): www.cdc.gov Contact a health care provider:  Before starting a new exercise program.  If you have questions or concerns about your weight.  If you have a medical problem that keeps you from exercising. Get help right away if you have any of the following while exercising:  Injury.  Dizziness.  Difficulty breathing or shortness of breath that does not go away when you stop exercising.  Chest pain.  Rapid heartbeat. Summary  Being overweight increases your risk of heart disease, stroke, diabetes, high blood pressure, and several types of cancer.  Losing weight happens when you burn more calories than you eat.  Reducing the amount of calories you eat in addition to getting regular moderate or vigorous exercise each week helps you lose weight. This information is not intended to replace advice given to you by your health care provider. Make sure you discuss any questions you have with your health care provider. Document Revised: 02/10/2017 Document Reviewed: 02/10/2017 Elsevier Patient Education  2020 [employer] Calorie Counting for Weight Loss Calories are units of energy. Your body needs a certain amount of calories from food to keep you going throughout the day. When you eat more calories than your body needs, your body stores the extra calories as fat. When you eat fewer calories than your body needs, your body burns fat to get the energy it needs. Calorie counting means keeping track of how many calories you eat and drink each day. Calorie counting can be helpful if you need to lose weight. If you make sure to eat fewer calories  than your body needs, you should lose weight. Ask your health care provider what a healthy weight is for you. For calorie counting to work, you will need to eat the right number of calories in a day in order to lose a healthy amount of weight per week. A dietitian can help you determine how many calories you need in a day and will give you suggestions on how to reach your calorie goal.  A healthy amount of weight to lose per week is usually 1-2 lb (0.5-0.9 kg). This usually means that your daily calorie intake should be reduced by 500-750 calories.  Eating 1,200 - 1,500 calories per day can help most women lose weight.  Eating 1,500 - 1,800 calories per day can help most men lose weight. What is my plan? My goal is to have __________ calories per day. If I have this many calories per day, I should lose around __________ pounds per week. What do I need to know about calorie counting? In order to meet your daily calorie goal, you will need to:  Find out how many calories are in each food you would like to eat. Try to do this before you eat.  Decide how much of the food you plan to eat.  Write down what you ate and how many calories it had. Doing this   is called keeping a food log. To successfully lose weight, it is important to balance calorie counting with a healthy lifestyle that includes regular activity. Aim for 150 minutes of moderate exercise (such as walking) or 75 minutes of vigorous exercise (such as running) each week. Where do I find calorie information?  The number of calories in a food can be found on a Nutrition Facts label. If a food does not have a Nutrition Facts label, try to look up the calories online or ask your dietitian for help. Remember that calories are listed per serving. If you choose to have more than one serving of a food, you will have to multiply the calories per serving by the amount of servings you plan to eat. For example, the label on a package of bread might  say that a serving size is 1 slice and that there are 90 calories in a serving. If you eat 1 slice, you will have eaten 90 calories. If you eat 2 slices, you will have eaten 180 calories. How do I keep a food log? Immediately after each meal, record the following information in your food log:  What you ate. Don't forget to include toppings, sauces, and other extras on the food.  How much you ate. This can be measured in cups, ounces, or number of items.  How many calories each food and drink had.  The total number of calories in the meal. Keep your food log near you, such as in a small notebook in your pocket, or use a mobile app or website. Some programs will calculate calories for you and show you how many calories you have left for the day to meet your goal. What are some calorie counting tips?   Use your calories on foods and drinks that will fill you up and not leave you hungry: ? Some examples of foods that fill you up are nuts and nut butters, vegetables, lean proteins, and high-fiber foods like whole grains. High-fiber foods are foods with more than 5 g fiber per serving. ? Drinks such as sodas, specialty coffee drinks, alcohol, and juices have a lot of calories, yet do not fill you up.  Eat nutritious foods and avoid empty calories. Empty calories are calories you get from foods or beverages that do not have many vitamins or protein, such as candy, sweets, and soda. It is better to have a nutritious high-calorie food (such as an avocado) than a food with few nutrients (such as a bag of chips).  Know how many calories are in the foods you eat most often. This will help you calculate calorie counts faster.  Pay attention to calories in drinks. Low-calorie drinks include water and unsweetened drinks.  Pay attention to nutrition labels for "low fat" or "fat free" foods. These foods sometimes have the same amount of calories or more calories than the full fat versions. They also often  have added sugar, starch, or salt, to make up for flavor that was removed with the fat.  Find a way of tracking calories that works for you. Get creative. Try different apps or programs if writing down calories does not work for you. What are some portion control tips?  Know how many calories are in a serving. This will help you know how many servings of a certain food you can have.  Use a measuring cup to measure serving sizes. You could also try weighing out portions on a kitchen scale. With time, you will be able   to estimate serving sizes for some foods.  Take some time to put servings of different foods on your favorite plates, bowls, and cups so you know what a serving looks like.  Try not to eat straight from a bag or box. Doing this can lead to overeating. Put the amount you would like to eat in a cup or on a plate to make sure you are eating the right portion.  Use smaller plates, glasses, and bowls to prevent overeating.  Try not to multitask (for example, watch TV or use your computer) while eating. If it is time to eat, sit down at a table and enjoy your food. This will help you to know when you are full. It will also help you to be aware of what you are eating and how much you are eating. What are tips for following this plan? Reading food labels  Check the calorie count compared to the serving size. The serving size may be smaller than what you are used to eating.  Check the source of the calories. Make sure the food you are eating is high in vitamins and protein and low in saturated and trans fats. Shopping  Read nutrition labels while you shop. This will help you make healthy decisions before you decide to purchase your food.  Make a grocery list and stick to it. Cooking  Try to cook your favorite foods in a healthier way. For example, try baking instead of frying.  Use low-fat dairy products. Meal planning  Use more fruits and vegetables. Half of your plate should be  fruits and vegetables.  Include lean proteins like poultry and fish. How do I count calories when eating out?  Ask for smaller portion sizes.  Consider sharing an entree and sides instead of getting your own entree.  If you get your own entree, eat only half. Ask for a box at the beginning of your meal and put the rest of your entree in it so you are not tempted to eat it.  If calories are listed on the menu, choose the lower calorie options.  Choose dishes that include vegetables, fruits, whole grains, low-fat dairy products, and lean protein.  Choose items that are boiled, broiled, grilled, or steamed. Stay away from items that are buttered, battered, fried, or served with cream sauce. Items labeled "crispy" are usually fried, unless stated otherwise.  Choose water, low-fat milk, unsweetened iced tea, or other drinks without added sugar. If you want an alcoholic beverage, choose a lower calorie option such as a glass of wine or light beer.  Ask for dressings, sauces, and syrups on the side. These are usually high in calories, so you should limit the amount you eat.  If you want a salad, choose a garden salad and ask for grilled meats. Avoid extra toppings like bacon, cheese, or fried items. Ask for the dressing on the side, or ask for olive oil and vinegar or lemon to use as dressing.  Estimate how many servings of a food you are given. For example, a serving of cooked rice is  cup or about the size of half a baseball. Knowing serving sizes will help you be aware of how much food you are eating at restaurants. The list below tells you how big or small some common portion sizes are based on everyday objects: ? 1 oz--4 stacked dice. ? 3 oz--1 deck of cards. ? 1 tsp--1 die. ? 1 Tbsp-- a ping-pong ball. ? 2 Tbsp--1 ping-pong ball. ?    cup-- baseball. ? 1 cup--1 baseball. Summary  Calorie counting means keeping track of how many calories you eat and drink each day. If you eat fewer  calories than your body needs, you should lose weight.  A healthy amount of weight to lose per week is usually 1-2 lb (0.5-0.9 kg). This usually means reducing your daily calorie intake by 500-750 calories.  The number of calories in a food can be found on a Nutrition Facts label. If a food does not have a Nutrition Facts label, try to look up the calories online or ask your dietitian for help.  Use your calories on foods and drinks that will fill you up, and not on foods and drinks that will leave you hungry.  Use smaller plates, glasses, and bowls to prevent overeating. This information is not intended to replace advice given to you by your health care provider. Make sure you discuss any questions you have with your health care provider. Document Revised: 10/17/2017 Document Reviewed: 12/29/2015 Elsevier Patient Education  2020 [employer]  

## 2019-05-28 ENCOUNTER — Telehealth: Payer: Self-pay

## 2019-05-28 NOTE — Telephone Encounter (Signed)
Insurance company called requesting additional documentation be sent to them with the following information:  1. Chart notes showing that 2 different types of laxatives were tried and failed.  2. Chart notes that state the patient is on chronic opiod therapy.   Fax documents c/o [place] @ [phone]

## 2019-05-31 ENCOUNTER — Ambulatory Visit: Payer: BLUE CROSS/BLUE SHIELD | Admitting: Gastroenterology

## 2019-06-01 NOTE — Telephone Encounter (Signed)
Received fax from [facility] and Duloxetine 60MG  1 tablet 2 times daily is approved until 05/24/2020. - CF

## 2019-06-04 NOTE — Telephone Encounter (Signed)
Notes report patient not taking movantik, is this because she's out or because she stopped it?   Please confirm w/ patient which other laxatives were tried/failed.  She is taking opiates - I am not the prescriber for these Rx

## 2019-06-10 NOTE — Telephone Encounter (Signed)
Sending to [facility] who is doing PA today

## 2019-06-11 ENCOUNTER — Encounter: Payer: Self-pay | Admitting: Nurse Practitioner

## 2019-06-17 ENCOUNTER — Encounter: Payer: Self-pay | Admitting: Pain Medicine

## 2019-06-17 LAB — HM COLONOSCOPY

## 2019-06-18 NOTE — Progress Notes (Signed)
Patient: Mary Cruz  Service Category: E/M  Provider: Gaspar Cola, MD  DOB: 08-23-1975  DOS: 06/21/2019  Location: Office  MRN: [id number]  Setting: Ambulatory outpatient  Referring Provider: Emeterio Reeve, DO  Type: Established Patient  Specialty: Interventional Pain Management  PCP: Emeterio Reeve, DO  Location: Remote location  Delivery: TeleHealth     Virtual Encounter - Pain Management PROVIDER NOTE: Information contained herein reflects review and annotations entered in association with encounter. Interpretation of such information and data should be left to medically-trained personnel. Information provided to patient can be located elsewhere in the medical record under "Patient Instructions". Document created using STT-dictation technology, any transcriptional errors that may result from process are unintentional.    Contact & Pharmacy Preferred: [phone] Home: [phone] (home) Mobile: [phone] (mobile) E-mail: [email or web address]  [id number] [place], [place] - [id number]: [phone]: [phone]  Pre-screening  Mary Cruz "in-person" vs "virtual" encounter. She indicated preferring virtual for this encounter.   Reason COVID-19*  Social distancing based on CDC and AMA recommendations.   I contacted Mary Cruz 06/21/2019 via telephone.      I clearly identified myself as FGaspar Cola MD. I verified that I was speaking with the correct person using two identifiers (Name: Mary Cruz and date of birth: [id number].  Consent I sought verbal advanced consent from Mary Loronfor virtual visit interactions. I informed Mary Cruz possible security and privacy concerns, risks, and limitations associated with providing "not-in-person" medical evaluation and management services. I also informed Mary Cruz the availability of "in-person"  appointments. Finally, I informed her that there would be a charge for the virtual visit and that she could be  personally, fully or partially, financially responsible for it. Mary Cruz understanding and agreed to proceed.   Historic Elements   Mary Cruz a 44y.o. year old, female patient evaluated today after her last contact with our practice on 05/14/2019. Mary Cruz Cruz a past medical history of Abscess of axillary fold (10/10/2013), Acute bronchitis with asthma with acute exacerbation (01/23/2015), Dehydration (04/19/2019), Elevated lactic acid level (04/19/2019), Encounter for management of implanted device (12/19/2014), Encounter for therapeutic drug level monitoring (12/19/2014), Fibromyalgia, Hyperglycemia (04/19/2019), Hypertension, Implant pain at battery site (right buttocks area) (04/05/2015), KIDNEY STONES (07/02/2007), Long term current use of opiate analgesic (12/19/2014), [place]upus ([place], [place] (12/19/2014), Muscle cramping (12/19/2014), Opiate use (30 MME/Day) (12/19/2014), Osteoarthritis, Pain, Right knee pain (01/10/2015), Sepsis ([place] (04/19/2019), Sepsis affecting skin (10/10/2013), and Tobacco abuse (04/19/2019). She also  Cruz a past surgical history that includes spinal cord stimulator; Occipital nerve stimulator insertion; Cholecystectomy (2000); Knee surgery; Shoulder surgery; Tubal ligation (2000); Endometrial ablation; Spinal cord stimulator implant (2008); Knee surgery (Right, 02-2015); Breast surgery; Spinal cord stimulator insertion (N/A, 06/21/2016); Shoulder arthroscopy with distal clavicle resection (Right, 10/29/2018); and Subacromial decompression (Right, 10/29/2018). Mary Cruz a current medication list which includes the following prescription(s): amitriptyline, clonazepam, duloxetine, lisinopril-hydrochlorothiazide, methocarbamol, [START ON 10/20/2019] methocarbamol, omeprazole, ondansetron, oxycodone, [START ON 07/22/2019] oxycodone, [START ON 08/21/2019]  oxycodone, [START ON 09/20/2019] oxycodone, phentermine, probiotic product, and rizatriptan. She  reports that she Cruz quit smoking. Her smoking use included cigarettes. She quit smokeless tobacco use about 17 months ago. She reports that she does not drink alcohol or use drugs. Mary Cruz allergic to gabapentin; celebrex [celecoxib]; pentazocine lactate; seroquel [quetiapine]; amoxicillin; bactrim [sulfamethoxazole-trimethoprim]; doxycycline; erythromycin;  hydromorphone hcl; keflex [cephalexin]; latex; lyrica [pregabalin]; and nabumetone.   HPI  Today, she is being contacted for both, medication management and a post-procedure assessment.  The patient indicates doing well with the current medication regimen. No adverse reactions or side effects reported to the medications.  She Cruz not started the prescription that we wrote on 03/10/2019 to be started on 05/23/2019.  We will assume that she will be starting it around 06/22/2019 meaning that it should last until 07/22/2019 at which time she will have the next prescription available.  The patient indicates having attained 90% relief of the pain on the left lower back and only 40% on the right side.  In addition, she Cruz been experiencing more right-sided lower extremity pain that goes all the way down to the top of her foot and what seems to be an L5 dermatomal distribution.  In addition to this the patient describes having difficulty heel walking when she was asked to do this maneuver.  This would confirm that were dealing with an L5 radiculopathy.  The patient's last MRI was in 2019 and it does not explain the current symptoms that she is having problems with.  Today we will go ahead and repeat that MRI since this Cruz been worsening and did not improve with the radiofrequency ablation.  Post-Procedure Evaluation  Procedure: Therapeutic left lumbar facet RFA #2 (04/08/2019) + right lumbar facet RFA #2 (05/13/2019) under fluoroscopic guidance and IV  sedation Pre-procedure pain level: 5/10 Post-procedure: 0/10 (100% relief)  Sedation: Sedation provided.  Effectiveness during initial hour after procedure(Ultra-Short Term Relief): 70 %  Local anesthetic used: Long-acting (4-6 hours) Effectiveness: Defined as any analgesic benefit obtained secondary to the administration of local anesthetics. This carries significant diagnostic value as to the etiological location, or anatomical origin, of the pain. Duration of benefit is expected to coincide with the duration of the local anesthetic used.  Effectiveness during initial 4-6 hours after procedure(Short-Term Relief): 70 %  Long-term benefit: Defined as any relief past the pharmacologic duration of the local anesthetics.  Effectiveness past the initial 6 hours after procedure(Long-Term Relief): 60 % (90% long-term improvement on the left side and currently 40% improvement on the right side.)  The discrepancy is due to the fact that she is currently experiencing right lower extremity pain that seems to be an L5 radiculopathy.  Current benefits: Defined as benefit that persist at this time.   Analgesia:  >50% relief Function: Mary Cruz reports improvement in function ROM: Mary Cruz reports improvement in ROM  Pharmacotherapy Assessment  Analgesic: Oxycodone IR 5 mg, 1 tab PO q 6 hrs (20 mg/day of oxycodone) MME/day:30 mg/day.   Monitoring: [place] PMP: PDMP reviewed during this encounter.       Pharmacotherapy: No side-effects or adverse reactions reported. Compliance: No problems identified. Effectiveness: Clinically acceptable. Plan: Refer to "POC".  UDS:  Summary  Date Value Ref Range Status  02/13/2018 FINAL  Final    Comment:    ==================================================================== TOXASSURE SELECT 13 (MW) ==================================================================== Test                             Result       Flag       Units Drug Present and Declared  for Prescription Verification   Oxycodone                      2035  EXPECTED   ng/mg creat   Oxymorphone                    564          EXPECTED   ng/mg creat   Noroxycodone                   1930         EXPECTED   ng/mg creat   Noroxymorphone                 142          EXPECTED   ng/mg creat    Sources of oxycodone are scheduled prescription medications.    Oxymorphone, noroxycodone, and noroxymorphone are expected    metabolites of oxycodone. Oxymorphone is also available as a    scheduled prescription medication. Drug Absent but Declared for Prescription Verification   Clonazepam                     Not Detected UNEXPECTED ng/mg creat ==================================================================== Test                      Result    Flag   Units      Ref Range   Creatinine              103              mg/dL      >=20 ==================================================================== Declared Medications:  The flagging and interpretation on this report are based on the  following declared medications.  Unexpected results may arise from  inaccuracies in the declared medications.  **Note: The testing scope of this panel includes these medications:  Clonazepam (Klonopin)  Oxycodone (Roxicodone)  **Note: The testing scope of this panel does not include following  reported medications:  Albuterol  Amitriptyline (Elavil)  Cholecalciferol  Diclofenac  Duloxetine (Cymbalta)  Esomeprazole  Methocarbamol (Robaxin)  Naproxen  Omeprazole (Prilosec)  Prednisone  Pregabalin (Lyrica)  Quetiapine (Seroquel) ==================================================================== For clinical consultation, please call [phone]. ====================================================================    Laboratory Chemistry Profile   Renal Lab Results  Component Value Date   BUN 13 04/21/2019   CREATININE 0.87 04/21/2019   LABCREA 47.20 09/81/1914   BCR NOT  APPLICABLE 78/29/5621   GFRAA >60 04/21/2019   GFRNONAA >60 04/21/2019     Hepatic Lab Results  Component Value Date   AST 20 04/20/2019   ALT 19 04/20/2019   ALBUMIN 2.7 (L) 04/20/2019   ALKPHOS 88 04/20/2019   LIPASE 23 04/19/2019     Electrolytes Lab Results  Component Value Date   NA 137 04/21/2019   K 3.3 (L) 04/21/2019   CL 104 04/21/2019   CALCIUM 8.4 (L) 04/21/2019   MG 2.0 04/20/2019   PHOS 3.2 04/20/2019     Bone Lab Results  Component Value Date   VD25OH 25 (L) 10/24/2016     Inflammation (CRP: Acute Phase) (ESR: Chronic Phase) Lab Results  Component Value Date   CRP 1.0 (H) 04/05/2015   ESRSEDRATE 35 (H) 04/05/2015   LATICACIDVEN 1.8 04/20/2019       Note: Above Lab results reviewed.  Imaging  DG PAIN CLINIC C-ARM 1-60 MIN NO REPORT Fluoro was used, but no Radiologist interpretation will be provided.  Please refer to "NOTES" tab for provider progress note.  Assessment  The primary encounter diagnosis was Chronic lower extremity pain (Right). 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  Plan of Care  Problem-specific:  No problem-specific Assessment & Plan notes found for this encounter.  Mary Cruz Cruz a current medication list which includes the following long-term medication(s): clonazepam, duloxetine, lisinopril-hydrochlorothiazide, methocarbamol, [START ON 10/20/2019] methocarbamol, omeprazole, oxycodone, [START ON 07/22/2019] oxycodone, [START ON 08/21/2019] oxycodone, [START ON 09/20/2019] oxycodone, phentermine, and  rizatriptan.  Pharmacotherapy (Medications Ordered): Meds ordered this encounter  Medications  . oxyCODONE (OXY IR/ROXICODONE) 5 MG immediate release tablet    Sig: Take 1 tablet (5 mg total) by mouth every 6 (six) hours as needed for severe pain. Must last 30 days    Dispense:  120 tablet    Refill:  0    Chronic Pain: STOP Act (Not applicable) Fill 1 day early if closed on refill date. Do not fill until: 07/22/2019. To last until: 08/21/2019. Avoid benzodiazepines within 8 hours of opioids  . oxyCODONE (OXY IR/ROXICODONE) 5 MG immediate release tablet    Sig: Take 1 tablet (5 mg total) by mouth every 6 (six) hours as needed for severe pain. Must last 30 days    Dispense:  120 tablet    Refill:  0    Chronic Pain: STOP Act (Not applicable) Fill 1 day early if closed on refill date. Do not fill until: 08/21/2019. To last until: 09/20/2019. Avoid benzodiazepines within 8 hours of opioids  . oxyCODONE (OXY IR/ROXICODONE) 5 MG immediate release tablet    Sig: Take 1 tablet (5 mg total) by mouth every 6 (six) hours as needed for severe pain. Must last 30 days    Dispense:  120 tablet    Refill:  0    Chronic Pain: STOP Act (Not applicable) Fill 1 day early if closed on refill date. Do not fill until: 09/20/2019. To last until: 10/20/2019. Avoid benzodiazepines within 8 hours of opioids  . methocarbamol (ROBAXIN) 750 MG tablet    Sig: Take 1 tablet (750 mg total) by mouth every 8 (eight) hours as needed for muscle spasms.    Dispense:  90 tablet    Refill:  5    Fill one day early if pharmacy is closed on scheduled refill date. May substitute for generic if available.   Orders:  Orders Placed This Encounter  Procedures  . Lumbar Epidural Injection    Standing Status:   Future    Standing Expiration Date:   07/22/2019    Scheduling Instructions:     Procedure: Interlaminar Lumbar Epidural Steroid injection (LESI)  L5-S1     Laterality: Right-sided     Sedation: With Sedation.     Timeframe: ASAA     Order Specific Question:   Where will this procedure be performed?    Answer:   [facility]  . MR LUMBAR SPINE WO CONTRAST    Patient presents with axial pain with possible radicular component.  In addition to any acute findings, please report on:  1. Facet (Zygapophyseal) joint DJD (Hypertrophy, space narrowing, subchondral sclerosis, and/or osteophyte formation) 2. DDD and/or IVDD (Loss of disc height, desiccation or "Black disc disease") 3. Pars defects 4. Spondylolisthesis, spondylosis, and/or spondyloarthropathies (include Degree/Grade of displacement in mm) 5. Vertebral body Fractures, including  age (old, new/acute) 47. Modic Type Changes 7. Demineralization 8. Bone pathology 9. Central, Lateral Recess, and/or Foraminal Stenosis (include AP diameter of stenosis in mm) 10. Surgical changes (hardware type, status, and presence of fibrosis)  NOTE: Please specify level(s) and laterality.    Standing Status:   Future    Standing Expiration Date:   09/21/2019    Order Specific Question:   What is the patient's sedation requirement?    Answer:   No Sedation    Order Specific Question:   Does the patient have a pacemaker or implanted devices?    Answer:   No    Order Specific Question:   Preferred imaging location?    Answer:   [facility] (table limit-350lbs)    Order Specific Question:   Call Results- Best Contact Number?    Answer:   [phone] ([facility])    Order Specific Question:   Radiology Contrast Protocol - do NOT remove file path    Answer:   [email or web address]  . ToxASSURE Select 13 (MW), Urine    Volume: 30 ml(s). Minimum 3 ml of urine is needed. Document temperature of fresh sample. Indications: Long term (current) use of opiate analgesic (X02.714)    Order Specific Question:   Release to patient    Answer:   Immediate   Follow-up plan:   Return in about 4 months (around 10/20/2019) for (F2F), (MM), in addition,  Procedure (w/ sedation): (R) L5-S1 LESI.      Interventional treatment options:  Under consideration:   Diagnostic right suprascapular nerve block #1  Repeat therapeutic bilateral lumbar facet RFA    Therapeutic/palliative (PRN):   Palliative bilateral lumbar facet blocks  Palliative right lumbar facet RFA #3 (last done on 05/13/2019)  Palliative left lumbar facet RFA #3 (last done on 04/08/2019)  Palliative left C7-T1 CESI  PRN SCS programming adjustments (2018 replacement by Dr. Clydell Hakim)    Recent Visits Date Type Provider Dept  05/13/19 Procedure visit Milinda Pointer, MD [facility]  04/08/19 Procedure visit Milinda Pointer, MD [facility]  Showing recent visits within past 90 days and meeting all other requirements   Today's Visits Date Type Provider Dept  06/21/19 Telemedicine Milinda Pointer, MD [facility]  Showing today's visits and meeting all other requirements   Future Appointments No visits were found meeting these conditions.  Showing future appointments within next 90 days and meeting all other requirements   I discussed the assessment and treatment plan with the patient. The patient was provided an opportunity to ask questions and all were answered. The patient agreed with the plan and demonstrated an understanding of the instructions.  Patient advised to call back or seek an in-person evaluation if the symptoms or condition worsens.  Duration of encounter: 15 minutes.  Note by: Gaspar Cola, MD Date: 06/21/2019; Time: 11:59 AM

## 2019-06-21 ENCOUNTER — Ambulatory Visit: Payer: BLUE CROSS/BLUE SHIELD | Attending: Pain Medicine | Admitting: Pain Medicine

## 2019-06-21 DIAGNOSIS — M5416 Radiculopathy, lumbar region: Secondary | ICD-10-CM | POA: Diagnosis not present

## 2019-06-21 DIAGNOSIS — M21379 Foot drop, unspecified foot: Secondary | ICD-10-CM | POA: Insufficient documentation

## 2019-06-21 DIAGNOSIS — M21371 Foot drop, right foot: Secondary | ICD-10-CM

## 2019-06-21 DIAGNOSIS — M797 Fibromyalgia: Secondary | ICD-10-CM

## 2019-06-21 DIAGNOSIS — G8929 Other chronic pain: Secondary | ICD-10-CM

## 2019-06-21 DIAGNOSIS — M542 Cervicalgia: Secondary | ICD-10-CM

## 2019-06-21 DIAGNOSIS — M47816 Spondylosis without myelopathy or radiculopathy, lumbar region: Secondary | ICD-10-CM

## 2019-06-21 DIAGNOSIS — M79604 Pain in right leg: Secondary | ICD-10-CM

## 2019-06-21 DIAGNOSIS — M5417 Radiculopathy, lumbosacral region: Secondary | ICD-10-CM

## 2019-06-21 DIAGNOSIS — M5136 Other intervertebral disc degeneration, lumbar region: Secondary | ICD-10-CM

## 2019-06-21 DIAGNOSIS — G894 Chronic pain syndrome: Secondary | ICD-10-CM

## 2019-06-21 DIAGNOSIS — M7918 Myalgia, other site: Secondary | ICD-10-CM

## 2019-06-21 DIAGNOSIS — Z79899 Other long term (current) drug therapy: Secondary | ICD-10-CM

## 2019-06-21 DIAGNOSIS — M5441 Lumbago with sciatica, right side: Secondary | ICD-10-CM

## 2019-06-21 DIAGNOSIS — R252 Cramp and spasm: Secondary | ICD-10-CM

## 2019-06-21 HISTORY — DX: Other chronic pain: G89.29

## 2019-06-21 HISTORY — DX: Spondylosis without myelopathy or radiculopathy, lumbar region: M47.816

## 2019-06-21 HISTORY — DX: Radiculopathy, lumbosacral region: M54.17

## 2019-06-21 HISTORY — DX: Foot drop, unspecified foot: M21.379

## 2019-06-21 MED ORDER — OXYCODONE HCL 5 MG PO TABS: 5.0000 mg | ORAL_TABLET | Freq: Four times a day (QID) | ORAL | 0 refills | Status: DC | PRN

## 2019-06-21 MED ORDER — METHOCARBAMOL 750 MG PO TABS: 750.0000 mg | ORAL_TABLET | Freq: Three times a day (TID) | ORAL | 5 refills | Status: DC | PRN

## 2019-06-21 NOTE — Patient Instructions (Signed)
____________________________________________________________________________________________  Preparing for Procedure with Sedation  Procedure appointments are limited to planned procedures: . No Prescription Refills. . No disability issues will be discussed. . No medication changes will be discussed.  Instructions: . Oral Intake: Do not eat or drink anything for at least 3 hours prior to your procedure. (Exception: Blood Pressure Medication. See below.) . Transportation: Unless otherwise stated by your physician, you may drive yourself after the procedure. . Blood Pressure Medicine: Do not forget to take your blood pressure medicine with a sip of water the morning of the procedure. If your Diastolic (lower reading)is above 100 mmHg, elective cases will be cancelled/rescheduled. . Blood thinners: These will need to be stopped for procedures. Notify our staff if you are taking any blood thinners. Depending on which one you take, there will be specific instructions on how and when to stop it. . Diabetics on insulin: Notify the staff so that you can be scheduled 1st case in the morning. If your diabetes requires high dose insulin, take only  of your normal insulin dose the morning of the procedure and notify the staff that you have done so. . Preventing infections: Shower with an antibacterial soap the morning of your procedure. . Build-up your immune system: Take 1000 mg of Vitamin C with every meal (3 times a day) the day prior to your procedure. . Antibiotics: Inform the staff if you have a condition or reason that requires you to take antibiotics before dental procedures. . Pregnancy: If you are pregnant, call and cancel the procedure. . Sickness: If you have a cold, fever, or any active infections, call and cancel the procedure. . Arrival: You must be in the facility at least 30 minutes prior to your scheduled procedure. . Children: Do not bring children with you. . Dress appropriately:  Bring dark clothing that you would not mind if they get stained. . Valuables: Do not bring any jewelry or valuables.  Reasons to call and reschedule or cancel your procedure: (Following these recommendations will minimize the risk of a serious complication.) . Surgeries: Avoid having procedures within 2 weeks of any surgery. (Avoid for 2 weeks before or after any surgery). . Flu Shots: Avoid having procedures within 2 weeks of a flu shots or . (Avoid for 2 weeks before or after immunizations). . Barium: Avoid having a procedure within 7-10 days after having had a radiological study involving the use of radiological contrast. (Myelograms, Barium swallow or enema study). . Heart attacks: Avoid any elective procedures or surgeries for the initial 6 months after a "Myocardial Infarction" (Heart Attack). . Blood thinners: It is imperative that you stop these medications before procedures. Let us know if you if you take any blood thinner.  . Infection: Avoid procedures during or within two weeks of an infection (including chest colds or gastrointestinal problems). Symptoms associated with infections include: Localized redness, fever, chills, night sweats or profuse sweating, burning sensation when voiding, cough, congestion, stuffiness, runny nose, sore throat, diarrhea, nausea, vomiting, cold or Flu symptoms, recent or current infections. It is specially important if the infection is over the area that we intend to treat. . Heart and lung problems: Symptoms that may suggest an active cardiopulmonary problem include: cough, chest pain, breathing difficulties or shortness of breath, dizziness, ankle swelling, uncontrolled high or unusually low blood pressure, and/or palpitations. If you are experiencing any of these symptoms, cancel your procedure and contact your primary care physician for an evaluation.  Remember:  Regular Business hours are:    Monday to Thursday 8:00 AM to 4:00 PM  Provider's  Schedule: Jermia Rigsby, MD:  Procedure days: Tuesday and Thursday 7:30 AM to 4:00 PM  Bilal Lateef, MD:  Procedure days: Monday and Wednesday 7:30 AM to 4:00 PM ____________________________________________________________________________________________   ____________________________________________________________________________________________  General Risks and Possible Complications  Patient Responsibilities: It is important that you read this as it is part of your informed consent. It is our duty to inform you of the risks and possible complications associated with treatments offered to you. It is your responsibility as a patient to read this and to ask questions about anything that is not clear or that you believe was not covered in this document.  Patient's Rights: You have the right to refuse treatment. You also have the right to change your mind, even after initially having agreed to have the treatment done. However, under this last option, if you wait until the last second to change your mind, you may be charged for the materials used up to that point.  Introduction: Medicine is not an exact science. Everything in Medicine, including the lack of treatment(s), carries the potential for danger, harm, or loss (which is by definition: Risk). In Medicine, a complication is a secondary problem, condition, or disease that can aggravate an already existing one. All treatments carry the risk of possible complications. The fact that a side effects or complications occurs, does not imply that the treatment was conducted incorrectly. It must be clearly understood that these can happen even when everything is done following the highest safety standards.  No treatment: You can choose not to proceed with the proposed treatment alternative. The "PRO(s)" would include: avoiding the risk of complications associated with the therapy. The "CON(s)" would include: not getting any of the treatment  benefits. These benefits fall under one of three categories: diagnostic; therapeutic; and/or palliative. Diagnostic benefits include: getting information which can ultimately lead to improvement of the disease or symptom(s). Therapeutic benefits are those associated with the successful treatment of the disease. Finally, palliative benefits are those related to the decrease of the primary symptoms, without necessarily curing the condition (example: decreasing the pain from a flare-up of a chronic condition, such as incurable terminal cancer).  General Risks and Complications: These are associated to most interventional treatments. They can occur alone, or in combination. They fall under one of the following six (6) categories: no benefit or worsening of symptoms; bleeding; infection; nerve damage; allergic reactions; and/or death. 1. No benefits or worsening of symptoms: In Medicine there are no guarantees, only probabilities. No healthcare provider can ever guarantee that a medical treatment will work, they can only state the probability that it may. Furthermore, there is always the possibility that the condition may worsen, either directly, or indirectly, as a consequence of the treatment. 2. Bleeding: This is more common if the patient is taking a blood thinner, either prescription or over the counter (example: Goody Powders, Fish oil, Aspirin, Garlic, etc.), or if suffering a condition associated with impaired coagulation (example: Hemophilia, cirrhosis of the liver, low platelet counts, etc.). However, even if you do not have one on these, it can still happen. If you have any of these conditions, or take one of these drugs, make sure to notify your treating physician. 3. Infection: This is more common in patients with a compromised immune system, either due to disease (example: diabetes, cancer, human immunodeficiency virus [HIV], etc.), or due to medications or treatments (example: therapies used to treat  cancer and   rheumatological diseases). However, even if you do not have one on these, it can still happen. If you have any of these conditions, or take one of these drugs, make sure to notify your treating physician. 4. Nerve Damage: This is more common when the treatment is an invasive one, but it can also happen with the use of medications, such as those used in the treatment of cancer. The damage can occur to small secondary nerves, or to large primary ones, such as those in the spinal cord and brain. This damage may be temporary or permanent and it may lead to impairments that can range from temporary numbness to permanent paralysis and/or brain death. 5. Allergic Reactions: Any time a substance or material comes in contact with our body, there is the possibility of an allergic reaction. These can range from a mild skin rash (contact dermatitis) to a severe systemic reaction (anaphylactic reaction), which can result in death. 6. Death: In general, any medical intervention can result in death, most of the time due to an unforeseen complication. ____________________________________________________________________________________________   

## 2019-06-24 ENCOUNTER — Telehealth: Payer: BLUE CROSS/BLUE SHIELD | Admitting: Pain Medicine

## 2019-06-29 ENCOUNTER — Encounter: Payer: Self-pay | Admitting: Osteopathic Medicine

## 2019-07-06 ENCOUNTER — Ambulatory Visit: Payer: BLUE CROSS/BLUE SHIELD | Admitting: Pain Medicine

## 2019-07-07 ENCOUNTER — Ambulatory Visit: Payer: BLUE CROSS/BLUE SHIELD

## 2019-08-26 ENCOUNTER — Ambulatory Visit: Payer: BLUE CROSS/BLUE SHIELD | Admitting: Osteopathic Medicine

## 2019-09-20 ENCOUNTER — Emergency Department (INDEPENDENT_AMBULATORY_CARE_PROVIDER_SITE_OTHER): Payer: Self-pay

## 2019-09-20 ENCOUNTER — Other Ambulatory Visit: Payer: Self-pay

## 2019-09-20 ENCOUNTER — Emergency Department
Admission: RE | Admit: 2019-09-20 | Discharge: 2019-09-20 | Disposition: A | Discharge status: Home / Self Care | Payer: Self-pay | Source: Ambulatory Visit

## 2019-09-20 VITALS — BP 138/95 | HR 99 | Temp 99.0°F | Resp 18

## 2019-09-20 DIAGNOSIS — J209 Acute bronchitis, unspecified: Secondary | ICD-10-CM

## 2019-09-20 DIAGNOSIS — H6692 Otitis media, unspecified, left ear: Secondary | ICD-10-CM

## 2019-09-20 MED ORDER — PREDNISONE 50 MG PO TABS
50.0000 mg | ORAL_TABLET | Freq: Every day | ORAL | 0 refills | Status: AC
Start: 2019-09-20 — End: 2019-09-25

## 2019-09-20 MED ORDER — ALBUTEROL SULFATE HFA 108 (90 BASE) MCG/ACT IN AERS: 1.0000 | INHALATION_SPRAY | Freq: Four times a day (QID) | RESPIRATORY_TRACT | 0 refills | Status: DC | PRN

## 2019-09-20 MED ORDER — AZITHROMYCIN 250 MG PO TABS: 250.0000 mg | ORAL_TABLET | Freq: Every day | ORAL | 0 refills | Status: DC

## 2019-09-20 MED ORDER — DEXAMETHASONE SODIUM PHOSPHATE 10 MG/ML IJ SOLN
10.0000 mg | Freq: Once | INTRAMUSCULAR | Status: AC
  Administered 2019-09-20: 10 mg via INTRAMUSCULAR

## 2019-09-20 NOTE — ED Provider Notes (Addendum)
[facility]    CSN: [id number] Arrival date & time: 09/20/19  1409      History   Chief Complaint Chief Complaint  Patient presents with  . Appointment    2:00  . Cough    HPI Mary Cruz is a 44 y.o. female.   HPI  Mary Cruz is a 44 y.o. female presenting to UC with c/o 5 days worsening cough, congestion, fatigue, headache, and body aches. She has taken OTC cough/cold medication without relief. Possible fever today, she had chills.  Denies n/v/d. Mild chest pain and SOB at times.  No hx of asthma or COPD but she has had bronchitis in the past. She has not received the Covid-19 vaccine. Her adult daughter is in [facility] being seen for similar but less severe symptoms today. Pt's symptoms started first, notes her son was sick a few weeks ago but never got bad.    Past Medical History:  Diagnosis Date  . Abscess of axillary fold 10/10/2013  . Acute bronchitis with asthma with acute exacerbation 01/23/2015  . Dehydration 04/19/2019  . Elevated lactic acid level 04/19/2019  . Encounter for management of implanted device 12/19/2014  . Encounter for therapeutic drug level monitoring 12/19/2014  . Fibromyalgia   . Hyperglycemia 04/19/2019  . Hypertension   . Implant pain at battery site (right buttocks area) 04/05/2015  . KIDNEY STONES 07/02/2007   Qualifier: History of  By: Bobby Rumpf CMA (AAMA), [place]    . Long term current use of opiate analgesic 12/19/2014  . Lupus ([place])   . Morbid obesity ([place]) 12/19/2014  . Muscle cramping 12/19/2014  . Opiate use (30 MME/Day) 12/19/2014  . Osteoarthritis   . Pain    chronic regional pain syndrome  . Right knee pain 01/10/2015  . Sepsis ([place]) 04/19/2019  . Sepsis affecting skin 10/10/2013  . Tobacco abuse 04/19/2019    Patient Active Problem List   Diagnosis Date Noted  . Chronic low back pain (Right) w/ sciatica (Right) 06/21/2019  . Chronic lower extremity pain (Right) 06/21/2019  . Lumbosacral radiculopathy at L5 (Right)  06/21/2019  . 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 Lumbar facet arthropathy 06/21/2019  . Colitis 04/19/2019  . Osteoarthritis involving multiple joints 04/08/2019  . Latex precautions, history of latex allergy 04/08/2019  . Acute postoperative pain 04/08/2019  . Spondylosis without myelopathy or radiculopathy, lumbosacral region 02/25/2019  . DDD (degenerative disc disease), lumbar 02/25/2019  . DDD (degenerative disc disease), cervical 11/15/2018  . Cervicalgia 11/15/2018  . Abnormal MRI, shoulder (Right) 11/15/2018  . Chronic upper extremity pain (Secondary area of Pain) (Bilateral) (L>R) 08/20/2018  . Chronic hip pain (Third area of Pain) (Bilateral) (R>L) 08/20/2018  . Closed fracture of scapula, sequela (Right) 08/20/2018  . Pharmacologic therapy 08/20/2018  . Disorder of skeletal system 08/20/2018  . Problems influencing health status 08/20/2018  . Pain in right knee 03/19/2017  . Family history of thyroid disease 10/24/2016  . Neurogenic pain 10/10/2016  . Fibromyalgia 10/10/2016  . Spinal cord stimulator status 05/16/2016  . Battery end of life of spinal cord stimulator 05/16/2016  . Positive ANA (antinuclear antibody) 09/13/2015  . Lumbar spondylosis 06/29/2015  . Lumbar facet syndrome (Bilateral) (R>L) 06/29/2015  . Anxiety and depression 01/20/2015  . Long term prescription opiate use 12/19/2014  . Opiate dependence ([place]) 12/19/2014  . Chronic musculoskeletal pain 12/19/2014  . Osteoarthrosis 12/19/2014  . Vitamin D insufficiency 12/19/2014  . Presence of functional implant (Medtronic Lumbar spinal cord stimulator implant)  12/19/2014  . Chronic low back pain (Bilateral) (R>L) 12/19/2014  . Chronic lumbar radicular pain (Right L5 dermatome; Left S1 Dermatome) (Bilateral) (R>L) 12/19/2014  . Chronic neck pain (Primary Area of Pain) (Bilateral) (L>R) 12/19/2014  . Nicotine dependence 12/19/2014  . Chronic cervical radicular pain (C7 Dermatome) (Bilateral) (L>R) 12/19/2014  .  Chronic lower extremity pain (Bilateral) (R>L) 12/19/2014  . Severe obesity with body mass index (BMI) of 35.0 to 39.9 with comorbidity ([place]) 10/27/2014  . Generalized anxiety disorder 03/16/2014  . Essential hypertension, benign 07/09/2013  . Chronic pain syndrome 07/09/2013  . GERD 07/02/2007  . Irritable bowel syndrome 07/02/2007    Past Surgical History:  Procedure Laterality Date  . BREAST SURGERY    . CHOLECYSTECTOMY  2000  . ENDOMETRIAL ABLATION    . KNEE SURGERY    . KNEE SURGERY Right 02-2015  . OCCIPITAL NERVE STIMULATOR INSERTION    . SHOULDER ARTHROSCOPY WITH DISTAL CLAVICLE RESECTION Right 10/29/2018   Procedure: SHOULDER ARTHROSCOPY WITH DISTAL CLAVICLE RESECTION;  Surgeon: Hiram Gash, MD;  Location: [place];  Service: Orthopedics;  Laterality: Right;  . SHOULDER SURGERY    . spinal cord stimulator    . SPINAL CORD STIMULATOR IMPLANT  2008  . SPINAL CORD STIMULATOR INSERTION N/A 06/21/2016   Procedure: LUMBAR SPINAL CORD STIMULATOR INSERTION;  Surgeon: Clydell Hakim, MD;  Location: [place];  Service: Neurosurgery;  Laterality: N/A;  LUMBAR SPINAL CORD STIMULATOR INSERTION  . SUBACROMIAL DECOMPRESSION Right 10/29/2018   Procedure: SUBACROMIAL DECOMPRESSION;  Surgeon: Hiram Gash, MD;  Location: [place];  Service: Orthopedics;  Laterality: Right;  . TUBAL LIGATION  2000    OB History   No obstetric history on file.      Home Medications    Prior to Admission medications   Medication Sig Start Date End Date Taking? Authorizing Provider  albuterol (VENTOLIN HFA) 108 (90 Base) MCG/ACT inhaler Inhale 1-2 puffs into the lungs every 6 (six) hours as needed for wheezing or shortness of breath. 09/20/19   Noe Gens, PA-C  amitriptyline (ELAVIL) 100 MG tablet Take 100 mg by mouth.    [provider]  azithromycin (ZITHROMAX) 250 MG tablet Take 1 tablet (250 mg total) by mouth daily. Take first 2 tablets together, then 1 every  day until finished. 09/20/19   Noe Gens, PA-C  clonazePAM (KLONOPIN) 0.5 MG tablet Take 0.5-1 tablets (0.25-0.5 mg total) by mouth 2 (two) times daily as needed for anxiety. 04/15/19   Orma Render, NP  DULoxetine (CYMBALTA) 60 MG capsule Take 2 capsules (120 mg total) by mouth daily. 05/27/19   Orma Render, NP  lisinopril-hydrochlorothiazide (ZESTORETIC) 10-12.5 MG tablet Take 1 tablet by mouth daily. 04/28/19   Emeterio Reeve, DO  methocarbamol (ROBAXIN) 750 MG tablet Take 1 tablet (750 mg total) by mouth every 8 (eight) hours as needed for muscle spasms. 02/22/19 08/21/19  Milinda Pointer, MD  methocarbamol (ROBAXIN) 750 MG tablet Take 1 tablet (750 mg total) by mouth every 8 (eight) hours as needed for muscle spasms. 10/20/19 04/17/20  Milinda Pointer, MD  omeprazole (PRILOSEC) 40 MG capsule Take 1 capsule (40 mg total) by mouth daily. 10/24/16   Emeterio Reeve, DO  ondansetron (ZOFRAN) 4 MG tablet Take 4 mg by mouth every 8 (eight) hours as needed for nausea or vomiting.    [provider]  oxyCODONE (OXY IR/ROXICODONE) 5 MG immediate release tablet Take 1 tablet (5 mg total) by mouth every  6 (six) hours as needed for severe pain. Must last 30 days 05/23/19 06/22/19  Milinda Pointer, MD  oxyCODONE (OXY IR/ROXICODONE) 5 MG immediate release tablet Take 1 tablet (5 mg total) by mouth every 6 (six) hours as needed for severe pain. Must last 30 days 07/22/19 08/21/19  Milinda Pointer, MD  oxyCODONE (OXY IR/ROXICODONE) 5 MG immediate release tablet Take 1 tablet (5 mg total) by mouth every 6 (six) hours as needed for severe pain. Must last 30 days 08/21/19 09/20/19  Milinda Pointer, MD  oxyCODONE (OXY IR/ROXICODONE) 5 MG immediate release tablet Take 1 tablet (5 mg total) by mouth every 6 (six) hours as needed for severe pain. Must last 30 days 09/20/19 10/20/19  Milinda Pointer, MD  phentermine 37.5 MG capsule Take 1 capsule (37.5 mg total) by mouth every morning. 05/27/19   Orma Render, NP  predniSONE (DELTASONE) 50 MG tablet Take 1 tablet (50 mg total) by mouth daily with breakfast for 5 days. 09/20/19 09/25/19  Noe Gens, PA-C  Probiotic Product (ALIGN PO) Take by mouth daily.    [provider]  rizatriptan (MAXALT) 10 MG tablet Take 1 tablet (10 mg total) by mouth as needed for migraine. May repeat in 2 hours if needed 04/15/19   Early, Coralee Pesa, NP    Family History Family History  Problem Relation Age of Onset  . Depression Mother        maternal grandmother  . Asthma Mother   . Fibromyalgia Other        aunt  . Stroke Other        grandmother  . Thyroid disease Other        grandmother    Social History Social History   Tobacco Use  . Smoking status: Former Smoker    Types: Cigarettes  . Smokeless tobacco: Former Systems developer    Quit date: 12/22/2017  Vaping Use  . Vaping Use: Former  Substance Use Topics  . Alcohol use: No    Alcohol/week: 0.0 standard drinks  . Drug use: No     Allergies   Gabapentin, Celebrex [celecoxib], Pentazocine lactate, Seroquel [quetiapine], Amoxicillin, Bactrim [sulfamethoxazole-trimethoprim], Doxycycline, Erythromycin, Hydromorphone hcl, Keflex [cephalexin], Latex, Lyrica [pregabalin], and Nabumetone   Review of Systems Review of Systems  Constitutional: Positive for chills and fever (subjective).  HENT: Positive for congestion, ear pain (pressure), sinus pressure and sore throat. Negative for trouble swallowing and voice change.   Respiratory: Positive for cough and shortness of breath.   Cardiovascular: Negative for chest pain and palpitations.  Gastrointestinal: Negative for abdominal pain, diarrhea, nausea and vomiting.  Musculoskeletal: Negative for arthralgias, back pain and myalgias.  Skin: Negative for rash.  Neurological: Positive for headaches. Negative for dizziness and light-headedness.  All other systems reviewed and are negative.    Physical Exam Triage Vital Signs ED Triage Vitals    Enc Vitals Group     BP 09/20/19 1435 (!) 138/95     Pulse Rate 09/20/19 1435 99     Resp 09/20/19 1435 18     Temp 09/20/19 1435 99 F (37.2 C)     Temp Source 09/20/19 1435 Oral     SpO2 09/20/19 1435 99 %     Weight --      Height --      Head Circumference --      Peak Flow --      Pain Score 09/20/19 1433 9     Pain Loc --  Pain Edu? --      Excl. in [place]? --    No data found.  Updated Vital Signs BP (!) 138/95 (BP Location: Right Arm)   Pulse 99   Temp 99 F (37.2 C) (Oral)   Resp 18   SpO2 99%   Visual Acuity Right Eye Distance:   Left Eye Distance:   Bilateral Distance:    Right Eye Near:   Left Eye Near:    Bilateral Near:     Physical Exam Vitals and nursing note reviewed.  Constitutional:      Appearance: Normal appearance. She is well-developed. She is obese.  HENT:     Head: Normocephalic and atraumatic.     Right Ear: Tympanic membrane and ear canal normal.     Left Ear: Tympanic membrane is erythematous. Tympanic membrane is not bulging.     Nose: Nose normal.     Mouth/Throat:     Lips: Pink.     Mouth: Mucous membranes are moist.     Pharynx: Oropharynx is clear. Uvula midline.  Cardiovascular:     Rate and Rhythm: Normal rate and regular rhythm.  Pulmonary:     Effort: Pulmonary effort is normal. No respiratory distress.     Breath sounds: No stridor. Rhonchi ( diffuse) present. No wheezing or rales.  Musculoskeletal:        General: Normal range of motion.     Cervical back: Normal range of motion.  Skin:    General: Skin is warm and dry.  Neurological:     Mental Status: She is alert and oriented to person, place, and time.  Psychiatric:        Behavior: Behavior normal.      UC Treatments / Results  Labs (all labs ordered are listed, but only abnormal results are displayed) Labs Reviewed  NOVEL CORONAVIRUS, NAA    EKG   Radiology DG Chest 2 View  Result Date: 09/20/2019 CLINICAL DATA:  Cough, congestion, chest  pain EXAM: CHEST - 2 VIEW COMPARISON:  01/23/2015 FINDINGS: Peribronchial thickening. Heart is normal size. No confluent opacities or effusions. No acute bony abnormality. Spinal stimulator wires noted in the thoracic region. IMPRESSION: Bronchitic changes. Electronically Signed   By: Rolm Baptise M.D.   On: 09/20/2019 15:20    Procedures Procedures (including critical care time)  Medications Ordered in UC Medications  dexamethasone (DECADRON) injection 10 mg (has no administration in time range)    Initial Impression / Assessment and Plan / UC Course  I have reviewed the triage vital signs and the nursing notes.  Pertinent labs & imaging results that were available during my care of the patient were reviewed by me and considered in my medical decision making (see chart for details).    Hx and exam c/w Left AOM and acute bronchitis Will tx with azithromycin as pt is allergic to or intolerant of multiple antibiotics. Has had azithromycin before without reaction. Encouraged close f/u with PCP Discussed symptoms that warrant emergent care in the ED. AVS given   Final Clinical Impressions(s) / UC Diagnoses   Final diagnoses:  Acute bronchitis, unspecified organism  Left acute otitis media     Discharge Instructions      Please take antibiotics as prescribed and be sure to complete entire course even if you start to feel better to ensure infection does not come back.  You may take 500mg  acetaminophen every 4-6 hours or in combination with ibuprofen 400-600mg  every 6-8 hours as needed for  pain, inflammation, and fever.  Be sure to well hydrated with clear liquids and get at least 8 hours of sleep at night, preferably more while sick.   Please follow up with family medicine later this week for recheck of symptoms, especially if not improving.  Call 911 or have someone drive you to the hospital if symptoms worsening- chest pain, trouble breathing, dizziness/passing out, unable to  keep down fluids, or other new concerning symptoms develop.   Due to concern for possibly having Covid-19, it is advised that you self-isolate at home until test results come back, usually 2-3 days.  If positive, it is recommended you stay isolated for at least 10 days after symptom onset and 24 hours after last fever without taking medication (whichever is longer).  If you MUST go out, please wear a mask at all times, limit contact with others.   If your test is negative, you still have plenty of time to get the Covid vaccine. It is recommended you schedule an appointment to get your vaccine once you get over this current illness.  Please ask your primary care provider about any questions/concerns related to the vaccine.        ED Prescriptions    Medication Sig Dispense Auth. Provider   predniSONE (DELTASONE) 50 MG tablet Take 1 tablet (50 mg total) by mouth daily with breakfast for 5 days. 5 tablet Gerarda Fraction, Rashel Okeefe O, PA-C   azithromycin (ZITHROMAX) 250 MG tablet Take 1 tablet (250 mg total) by mouth daily. Take first 2 tablets together, then 1 every day until finished. 6 tablet Gerarda Fraction, Byrd Rushlow O, PA-C   albuterol (VENTOLIN HFA) 108 (90 Base) MCG/ACT inhaler Inhale 1-2 puffs into the lungs every 6 (six) hours as needed for wheezing or shortness of breath. 18 g Noe Gens, PA-C     PDMP not reviewed this encounter.     Noe Gens, [place] 09/20/19 1539

## 2019-09-20 NOTE — ED Triage Notes (Signed)
Patient presents to Urgent Care with complaints of cough, congestions, and generalized body aches since 5 days ago. Patient reports possible fever today, has not been vaccinated for covid. Pt would like to be covid tested today.

## 2019-09-20 NOTE — Discharge Instructions (Signed)
  Please take antibiotics as prescribed and be sure to complete entire course even if you start to feel better to ensure infection does not come back.  You may take 500mg  acetaminophen every 4-6 hours or in combination with ibuprofen 400-600mg  every 6-8 hours as needed for pain, inflammation, and fever.  Be sure to well hydrated with clear liquids and get at least 8 hours of sleep at night, preferably more while sick.   Please follow up with family medicine later this week for recheck of symptoms, especially if not improving.  Call 911 or have someone drive you to the hospital if symptoms worsening- chest pain, trouble breathing, dizziness/passing out, unable to keep down fluids, or other new concerning symptoms develop.   Due to concern for possibly having Covid-19, it is advised that you self-isolate at home until test results come back, usually 2-3 days.  If positive, it is recommended you stay isolated for at least 10 days after symptom onset and 24 hours after last fever without taking medication (whichever is longer).  If you MUST go out, please wear a mask at all times, limit contact with others.   If your test is negative, you still have plenty of time to get the Covid vaccine. It is recommended you schedule an appointment to get your vaccine once you get over this current illness.  Please ask your primary care provider about any questions/concerns related to the vaccine.

## 2019-09-22 LAB — NOVEL CORONAVIRUS, NAA: SARS-CoV-2, NAA: NOT DETECTED

## 2019-09-22 LAB — SARS-COV-2, NAA 2 DAY TAT

## 2019-09-29 ENCOUNTER — Ambulatory Visit: Payer: Self-pay | Admitting: Osteopathic Medicine

## 2019-11-12 DIAGNOSIS — G43909 Migraine, unspecified, not intractable, without status migrainosus: Secondary | ICD-10-CM

## 2019-11-12 HISTORY — DX: Migraine, unspecified, not intractable, without status migrainosus: G43.909

## 2019-12-31 ENCOUNTER — Emergency Department (HOSPITAL_COMMUNITY): Payer: Self-pay

## 2019-12-31 ENCOUNTER — Other Ambulatory Visit: Payer: Self-pay

## 2019-12-31 ENCOUNTER — Observation Stay (HOSPITAL_COMMUNITY)
Admission: EM | Admit: 2019-12-31 | Discharge: 2020-01-01 | Disposition: A | Discharge status: Home / Self Care | Payer: Self-pay | Attending: General Surgery | Admitting: General Surgery

## 2019-12-31 ENCOUNTER — Encounter (HOSPITAL_COMMUNITY): Payer: Self-pay | Admitting: Emergency Medicine

## 2019-12-31 DIAGNOSIS — K37 Unspecified appendicitis: Secondary | ICD-10-CM | POA: Diagnosis present

## 2019-12-31 DIAGNOSIS — E876 Hypokalemia: Secondary | ICD-10-CM

## 2019-12-31 DIAGNOSIS — Z91040 Latex allergy status: Secondary | ICD-10-CM | POA: Insufficient documentation

## 2019-12-31 DIAGNOSIS — I10 Essential (primary) hypertension: Secondary | ICD-10-CM | POA: Insufficient documentation

## 2019-12-31 DIAGNOSIS — Z87891 Personal history of nicotine dependence: Secondary | ICD-10-CM | POA: Insufficient documentation

## 2019-12-31 DIAGNOSIS — Z20822 Contact with and (suspected) exposure to covid-19: Secondary | ICD-10-CM | POA: Insufficient documentation

## 2019-12-31 DIAGNOSIS — Z79899 Other long term (current) drug therapy: Secondary | ICD-10-CM | POA: Insufficient documentation

## 2019-12-31 DIAGNOSIS — K3580 Unspecified acute appendicitis: Principal | ICD-10-CM | POA: Insufficient documentation

## 2019-12-31 LAB — URINALYSIS, ROUTINE W REFLEX MICROSCOPIC
Bilirubin Urine: NEGATIVE
Glucose, UA: NEGATIVE mg/dL
Hgb urine dipstick: NEGATIVE
Ketones, ur: NEGATIVE mg/dL
Leukocytes,Ua: NEGATIVE
Nitrite: NEGATIVE
Protein, ur: NEGATIVE mg/dL
Specific Gravity, Urine: 1.018 (ref 1.005–1.030)
pH: 5 (ref 5.0–8.0)

## 2019-12-31 LAB — CBC
HCT: 40.1 % (ref 36.0–46.0)
Hemoglobin: 12.9 g/dL (ref 12.0–15.0)
MCH: 29.3 pg (ref 26.0–34.0)
MCHC: 32.2 g/dL (ref 30.0–36.0)
MCV: 91.1 fL (ref 80.0–100.0)
Platelets: 286 10*3/uL (ref 150–400)
RBC: 4.4 MIL/uL (ref 3.87–5.11)
RDW: 13.8 % (ref 11.5–15.5)
WBC: 8.1 10*3/uL (ref 4.0–10.5)
nRBC: 0 % (ref 0.0–0.2)

## 2019-12-31 LAB — BASIC METABOLIC PANEL
Anion gap: 11 (ref 5–15)
BUN: 7 mg/dL (ref 6–20)
CO2: 27 mmol/L (ref 22–32)
Calcium: 9.1 mg/dL (ref 8.9–10.3)
Chloride: 100 mmol/L (ref 98–111)
Creatinine, Ser: 0.95 mg/dL (ref 0.44–1.00)
GFR, Estimated: >60 mL/min (ref >60)
Glucose, Bld: 122 mg/dL — ABNORMAL HIGH (ref 70–99)
Potassium: 3 mmol/L — ABNORMAL LOW (ref 3.5–5.1)
Sodium: 138 mmol/L (ref 135–145)

## 2019-12-31 LAB — I-STAT BETA HCG BLOOD, ED (MC, WL, AP ONLY): I-stat hCG, quantitative: <5 m[IU]/mL (ref <5)

## 2019-12-31 MED ORDER — POTASSIUM CHLORIDE 10 MEQ/100ML IV SOLN
10.0000 meq | Freq: Once | INTRAVENOUS | Status: AC
  Administered 2019-12-31: 10 meq via INTRAVENOUS
  Filled 2019-12-31: qty 100

## 2019-12-31 MED ORDER — PANTOPRAZOLE SODIUM 40 MG PO TBEC: 40.0000 mg | DELAYED_RELEASE_TABLET | Freq: Every day | ORAL | Status: DC

## 2019-12-31 MED ORDER — DULOXETINE HCL 30 MG PO CPEP: 120.0000 mg | ORAL_CAPSULE | Freq: Every day | ORAL | Status: DC

## 2019-12-31 MED ORDER — CLONAZEPAM 0.125 MG PO TBDP: 0.2500 mg | ORAL_TABLET | Freq: Two times a day (BID) | ORAL | Status: DC | PRN

## 2019-12-31 MED ORDER — KETOROLAC TROMETHAMINE 60 MG/2ML IM SOLN
60.0000 mg | Freq: Once | INTRAMUSCULAR | Status: AC
  Administered 2019-12-31: 60 mg via INTRAMUSCULAR
  Filled 2019-12-31: qty 2

## 2019-12-31 MED ORDER — ONDANSETRON 4 MG PO TBDP
4.0000 mg | ORAL_TABLET | Freq: Once | ORAL | Status: AC
  Administered 2019-12-31: 4 mg via ORAL
  Filled 2019-12-31: qty 1

## 2019-12-31 MED ORDER — CIPROFLOXACIN IN D5W 400 MG/200ML IV SOLN
400.0000 mg | Freq: Once | INTRAVENOUS | Status: AC
  Administered 2019-12-31: 400 mg via INTRAVENOUS
  Filled 2019-12-31: qty 200

## 2019-12-31 MED ORDER — AMITRIPTYLINE HCL 25 MG PO TABS
100.0000 mg | ORAL_TABLET | Freq: Every day | ORAL | Status: DC
  Administered 2020-01-01: 100 mg via ORAL
  Filled 2019-12-31: qty 4

## 2019-12-31 MED ORDER — METRONIDAZOLE IN NACL 5-0.79 MG/ML-% IV SOLN
500.0000 mg | Freq: Once | INTRAVENOUS | Status: AC
  Administered 2019-12-31: 500 mg via INTRAVENOUS
  Filled 2019-12-31: qty 100

## 2019-12-31 MED ORDER — LISINOPRIL-HYDROCHLOROTHIAZIDE 10-12.5 MG PO TABS: 1.0000 | ORAL_TABLET | Freq: Every day | ORAL | Status: DC

## 2019-12-31 MED ORDER — ALBUTEROL SULFATE HFA 108 (90 BASE) MCG/ACT IN AERS: 1.0000 | INHALATION_SPRAY | Freq: Four times a day (QID) | RESPIRATORY_TRACT | Status: DC | PRN

## 2019-12-31 MED ORDER — METHOCARBAMOL 500 MG PO TABS
750.0000 mg | ORAL_TABLET | Freq: Three times a day (TID) | ORAL | Status: DC | PRN
  Administered 2020-01-01: 750 mg via ORAL
  Filled 2019-12-31: qty 2

## 2019-12-31 MED ORDER — MORPHINE SULFATE (PF) 2 MG/ML IV SOLN
2.0000 mg | Freq: Once | INTRAVENOUS | Status: AC
  Administered 2019-12-31: 2 mg via INTRAVENOUS
  Filled 2019-12-31: qty 1

## 2019-12-31 NOTE — ED Triage Notes (Signed)
Patient here from home reporting bilateral flank pain, with nausea. Hx of same. "I'm sure I have a kidney stone".

## 2019-12-31 NOTE — ED Provider Notes (Signed)
[facility] DEPT Provider Note   CSN: [id number] Arrival date & time: 12/31/19  1944     History Chief Complaint  Patient presents with  . Flank Pain  . Nausea    Mary Cruz is a 44 y.o. female.  HPI 44 year old female with complaints of 3 days of abdominal pain, rest of bilateral back pain.  Patient states she has a previous history of kidney stones.  Initially started as central abdominal pain, which she attributed to gas.  However after several days the pain started to progress to her back which felt consistent with her prior history of kidney stones.  She noted some blood in her urine this morning.  Endorses some nausea but no vomiting.  Denies any fevers.    Past Medical History:  Diagnosis Date  . Abscess of axillary fold 10/10/2013  . Acute bronchitis with asthma with acute exacerbation 01/23/2015  . Dehydration 04/19/2019  . Elevated lactic acid level 04/19/2019  . Encounter for management of implanted device 12/19/2014  . Encounter for therapeutic drug level monitoring 12/19/2014  . Fibromyalgia   . Hyperglycemia 04/19/2019  . Hypertension   . Implant pain at battery site (right buttocks area) 04/05/2015  . KIDNEY STONES 07/02/2007   Qualifier: History of  By: Bobby Rumpf CMA (AAMA), Patty    . Long term current use of opiate analgesic 12/19/2014  . Lupus ([place])   . Morbid obesity ([place]) 12/19/2014  . Muscle cramping 12/19/2014  . Opiate use (30 MME/Day) 12/19/2014  . Osteoarthritis   . Pain    chronic regional pain syndrome  . Right knee pain 01/10/2015  . Sepsis ([place]) 04/19/2019  . Sepsis affecting skin 10/10/2013  . Tobacco abuse 04/19/2019    Patient Active Problem List   Diagnosis Date Noted  . Chronic low back pain (Right) w/ sciatica (Right) 06/21/2019  . Chronic lower extremity pain (Right) 06/21/2019  . Lumbosacral radiculopathy at L5 (Right) 06/21/2019  . 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 Lumbar facet arthropathy 06/21/2019  . Colitis  04/19/2019  . Osteoarthritis involving multiple joints 04/08/2019  . Latex precautions, history of latex allergy 04/08/2019  . Acute postoperative pain 04/08/2019  . Spondylosis without myelopathy or radiculopathy, lumbosacral region 02/25/2019  . DDD (degenerative disc disease), lumbar 02/25/2019  . DDD (degenerative disc disease), cervical 11/15/2018  . Cervicalgia 11/15/2018  . Abnormal MRI, shoulder (Right) 11/15/2018  . Chronic upper extremity pain (Secondary area of Pain) (Bilateral) (L>R) 08/20/2018  . Chronic hip pain (Third area of Pain) (Bilateral) (R>L) 08/20/2018  . Closed fracture of scapula, sequela (Right) 08/20/2018  . Pharmacologic therapy 08/20/2018  . Disorder of skeletal system 08/20/2018  . Problems influencing health status 08/20/2018  . Pain in right knee 03/19/2017  . Family history of thyroid disease 10/24/2016  . Neurogenic pain 10/10/2016  . Fibromyalgia 10/10/2016  . Spinal cord stimulator status 05/16/2016  . Battery end of life of spinal cord stimulator 05/16/2016  . Positive ANA (antinuclear antibody) 09/13/2015  . Lumbar spondylosis 06/29/2015  . Lumbar facet syndrome (Bilateral) (R>L) 06/29/2015  . Anxiety and depression 01/20/2015  . Long term prescription opiate use 12/19/2014  . Opiate dependence ([place]) 12/19/2014  . Chronic musculoskeletal pain 12/19/2014  . Osteoarthrosis 12/19/2014  . Vitamin D insufficiency 12/19/2014  . 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 Chronic low back pain (Bilateral) (R>L) 12/19/2014  . Chronic lumbar radicular pain (Right L5 dermatome; Left S1 Dermatome) (Bilateral) (R>L) 12/19/2014  .  Chronic neck pain (Primary Area of Pain) (Bilateral) (L>R) 12/19/2014  . Nicotine dependence 12/19/2014  . Chronic cervical radicular pain (C7 Dermatome) (Bilateral) (L>R) 12/19/2014  . Chronic lower extremity pain (Bilateral) (R>L) 12/19/2014  . Severe obesity with body mass  index (BMI) of 35.0 to 39.9 with comorbidity ([place]) 10/27/2014  . Generalized anxiety disorder 03/16/2014  . Essential hypertension, benign 07/09/2013  . Chronic pain syndrome 07/09/2013  . GERD 07/02/2007  . Irritable bowel syndrome 07/02/2007    Past Surgical History:  Procedure Laterality Date  . BREAST SURGERY    . CHOLECYSTECTOMY  2000  . ENDOMETRIAL ABLATION    . KNEE SURGERY    . KNEE SURGERY Right 02-2015  . OCCIPITAL NERVE STIMULATOR INSERTION    . SHOULDER ARTHROSCOPY WITH DISTAL CLAVICLE RESECTION Right 10/29/2018   Procedure: SHOULDER ARTHROSCOPY WITH DISTAL CLAVICLE RESECTION;  Surgeon: Hiram Gash, MD;  Location: [facility];  Service: Orthopedics;  Laterality: Right;  . SHOULDER SURGERY    . spinal cord stimulator    . SPINAL CORD STIMULATOR IMPLANT  2008  . SPINAL CORD STIMULATOR INSERTION N/A 06/21/2016   Procedure: LUMBAR SPINAL CORD STIMULATOR INSERTION;  Surgeon: Clydell Hakim, MD;  Location: [facility];  Service: Neurosurgery;  Laterality: N/A;  LUMBAR SPINAL CORD STIMULATOR INSERTION  . SUBACROMIAL DECOMPRESSION Right 10/29/2018   Procedure: SUBACROMIAL DECOMPRESSION;  Surgeon: Hiram Gash, MD;  Location: [facility];  Service: Orthopedics;  Laterality: Right;  . TUBAL LIGATION  2000     OB History   No obstetric history on file.     Family History  Problem Relation Age of Onset  . Depression Mother        maternal grandmother  . Asthma Mother   . Fibromyalgia Other        aunt  . Stroke Other        grandmother  . Thyroid disease Other        grandmother    Social History   Tobacco Use  . Smoking status: Former Smoker    Types: Cigarettes  . Smokeless tobacco: Former Systems developer    Quit date: 12/22/2017  Vaping Use  . Vaping Use: Former  Substance Use Topics  . Alcohol use: No    Alcohol/week: 0.0 standard drinks  . Drug use: No    Home Medications Prior to Admission medications   Medication Sig Start Date End Date  Taking? Authorizing Provider  albuterol (VENTOLIN HFA) 108 (90 Base) MCG/ACT inhaler Inhale 1-2 puffs into the lungs every 6 (six) hours as needed for wheezing or shortness of breath. 09/20/19   Noe Gens, PA-C  amitriptyline (ELAVIL) 100 MG tablet Take 100 mg by mouth.    [provider]  azithromycin (ZITHROMAX) 250 MG tablet Take 1 tablet (250 mg total) by mouth daily. Take first 2 tablets together, then 1 every day until finished. 09/20/19   Noe Gens, PA-C  clonazePAM (KLONOPIN) 0.5 MG tablet Take 0.5-1 tablets (0.25-0.5 mg total) by mouth 2 (two) times daily as needed for anxiety. 04/15/19   Orma Render, NP  DULoxetine (CYMBALTA) 60 MG capsule Take 2 capsules (120 mg total) by mouth daily. 05/27/19   Orma Render, NP  lisinopril-hydrochlorothiazide (ZESTORETIC) 10-12.5 MG tablet Take 1 tablet by mouth daily. 04/28/19   Emeterio Reeve, DO  methocarbamol (ROBAXIN) 750 MG tablet Take 1 tablet (750 mg total) by mouth every 8 (eight) hours as needed for muscle spasms. 02/22/19 08/21/19  Dossie Arbour,  Francisco, MD  methocarbamol (ROBAXIN) 750 MG tablet Take 1 tablet (750 mg total) by mouth every 8 (eight) hours as needed for muscle spasms. 10/20/19 04/17/20  Milinda Pointer, MD  omeprazole (PRILOSEC) 40 MG capsule Take 1 capsule (40 mg total) by mouth daily. 10/24/16   Emeterio Reeve, DO  ondansetron (ZOFRAN) 4 MG tablet Take 4 mg by mouth every 8 (eight) hours as needed for nausea or vomiting.    [provider]  oxyCODONE (OXY IR/ROXICODONE) 5 MG immediate release tablet Take 1 tablet (5 mg total) by mouth every 6 (six) hours as needed for severe pain. Must last 30 days 05/23/19 06/22/19  Milinda Pointer, MD  oxyCODONE (OXY IR/ROXICODONE) 5 MG immediate release tablet Take 1 tablet (5 mg total) by mouth every 6 (six) hours as needed for severe pain. Must last 30 days 07/22/19 08/21/19  Milinda Pointer, MD  oxyCODONE (OXY IR/ROXICODONE) 5 MG immediate release tablet Take 1  tablet (5 mg total) by mouth every 6 (six) hours as needed for severe pain. Must last 30 days 08/21/19 09/20/19  Milinda Pointer, MD  oxyCODONE (OXY IR/ROXICODONE) 5 MG immediate release tablet Take 1 tablet (5 mg total) by mouth every 6 (six) hours as needed for severe pain. Must last 30 days 09/20/19 10/20/19  Milinda Pointer, MD  phentermine 37.5 MG capsule Take 1 capsule (37.5 mg total) by mouth every morning. 05/27/19   Orma Render, NP  Probiotic Product (ALIGN PO) Take by mouth daily.    [provider]  rizatriptan (MAXALT) 10 MG tablet Take 1 tablet (10 mg total) by mouth as needed for migraine. May repeat in 2 hours if needed 04/15/19   Early, Coralee Pesa, NP    Allergies    Gabapentin, Celebrex [celecoxib], Pentazocine lactate, Seroquel [quetiapine], Amoxicillin, Bactrim [sulfamethoxazole-trimethoprim], Doxycycline, Erythromycin, Hydromorphone hcl, Keflex [cephalexin], Latex, Lyrica [pregabalin], and Nabumetone  Review of Systems   Review of Systems  Constitutional: Negative for chills and fever.  HENT: Negative for ear pain and sore throat.   Eyes: Negative for pain and visual disturbance.  Respiratory: Negative for cough and shortness of breath.   Cardiovascular: Negative for chest pain and palpitations.  Gastrointestinal: Positive for abdominal pain and nausea. Negative for diarrhea and vomiting.  Genitourinary: Positive for hematuria. Negative for dysuria, vaginal bleeding, vaginal discharge and vaginal pain.  Musculoskeletal: Positive for back pain. Negative for arthralgias.  Skin: Negative for color change and rash.  Neurological: Negative for seizures and syncope.  All other systems reviewed and are negative.   Physical Exam Updated Vital Signs BP 121/67   Pulse (!) 58   Temp 98.2 F (36.8 C) (Oral)   Resp 13   SpO2 98%   Physical Exam Vitals and nursing note reviewed.  Constitutional:      General: She is not in acute distress.    Appearance: She is  well-developed. She is not ill-appearing, toxic-appearing or diaphoretic.  HENT:     Head: Normocephalic and atraumatic.  Eyes:     Conjunctiva/sclera: Conjunctivae normal.  Cardiovascular:     Rate and Rhythm: Normal rate and regular rhythm.     Heart sounds: No murmur heard.   Pulmonary:     Effort: Pulmonary effort is normal. No respiratory distress.     Breath sounds: Normal breath sounds.  Abdominal:     Palpations: Abdomen is soft.     Tenderness: There is generalized abdominal tenderness. There is right CVA tenderness and left CVA tenderness.  Musculoskeletal:  General: Normal range of motion.     Cervical back: Neck supple.     Right lower leg: No edema.     Left lower leg: No edema.  Skin:    General: Skin is warm and dry.     Capillary Refill: Capillary refill takes less than 2 seconds.     Findings: No erythema or rash.  Neurological:     General: No focal deficit present.     Mental Status: She is alert and oriented to person, place, and time.  Psychiatric:        Mood and Affect: Mood normal.        Behavior: Behavior normal.     ED Results / Procedures / Treatments   Labs (all labs ordered are listed, but only abnormal results are displayed) Labs Reviewed  BASIC METABOLIC PANEL - Abnormal; Notable for the following components:      Result Value   Potassium 3.0 (*)    Glucose, Bld 122 (*)    All other components within normal limits  URINE CULTURE  RESP PANEL BY RT-PCR (FLU A&B, COVID) ARPGX2  URINALYSIS, ROUTINE W REFLEX MICROSCOPIC  CBC  I-STAT BETA HCG BLOOD, ED ([facility], [facility], [facility] ONLY)    EKG None  Radiology CT Renal Stone Study  Result Date: 12/31/2019 CLINICAL DATA:  Bilateral flank pain and nausea EXAM: CT ABDOMEN AND PELVIS WITHOUT CONTRAST TECHNIQUE: Multidetector CT imaging of the abdomen and pelvis was performed following the standard protocol without IV contrast. COMPARISON:  None. FINDINGS: Lower chest: The visualized heart size within  normal limits. No pericardial fluid/thickening. No hiatal hernia. The visualized portions of the lungs are clear. Hepatobiliary: Although limited due to the lack of intravenous contrast, normal in appearance without gross focal abnormality. The patient is status post cholecystectomy. No biliary ductal dilation. Pancreas:  Unremarkable.  No surrounding inflammatory changes. Spleen: Normal in size. Although limited due to the lack of intravenous contrast, normal in appearance. Adrenals/Urinary Tract: Both adrenal glands appear normal. The kidneys and collecting system appear normal without evidence of urinary tract calculus or hydronephrosis. Bladder is unremarkable. Stomach/Bowel: The stomach, small bowel, and colon are normal in appearance. No inflammatory changes or obstructive findings. There is a mildly dilated appendix measuring up to 9 mm with wall thickening with surrounding fat stranding changes. There is scattered small lymph nodes seen within the right lower quadrant. No loculated fluid collections or free air. Vascular/Lymphatic: There are no enlarged abdominal or pelvic lymph nodes. No significant gross vascular findings are present given the lack of intravenous contrast. Reproductive: The uterus and adnexa are unremarkable. Other: No evidence of abdominal wall mass or hernia. Musculoskeletal: No acute or significant osseous findings. IMPRESSION: Findings suggestive of acute appendicitis. No surrounding loculated fluid collections or free air. Electronically Signed   By: Prudencio Pair M.D.   On: 12/31/2019 22:18    Procedures Procedures (including critical care time)  Medications Ordered in ED Medications  ciprofloxacin (CIPRO) IVPB 400 mg (has no administration in time range)    And  metroNIDAZOLE (FLAGYL) IVPB 500 mg (has no administration in time range)  morphine 2 MG/ML injection 2 mg (has no administration in time range)  potassium chloride 10 mEq in 100 mL IVPB (has no administration in  time range)  ondansetron (ZOFRAN-ODT) disintegrating tablet 4 mg (4 mg Oral Given 12/31/19 2207)  ketorolac (TORADOL) injection 60 mg (60 mg Intramuscular Given 12/31/19 2207)    ED Course  I have reviewed the triage vital  signs and the nursing notes.  Pertinent labs & imaging results that were available during my care of the patient were reviewed by me and considered in my medical decision making (see chart for details).    MDM Rules/Calculators/A&P                         44 year old female with complaints of bilateral flank pain.  Arrival, she is afebrile, not tachycardic tachypneic or hypoxic.  BMP with hypokalemia which appears to be chronic, no other significant abnormalities.  UA without blood or infection.  Pregnancy is negative.  CT renal study consistent with acute appendicitis.  Patient started on Cipro and Flagyl as she does have an allergy to penicillins.  Consulted Dr. Donne Hazel with general surgery, he will see the patient in the morning and admit the patient.  Remains hemodynamically stable.  Morphine ordered for pain.  Patient was also seen and evaluated by Dr. Roslynn Amble who is agreeable to the above plan and disposition.  Final Clinical Impression(s) / ED Diagnoses Final diagnoses:  Acute appendicitis, unspecified acute appendicitis type  Hypokalemia    Rx / DC Orders ED Discharge Orders    None       Lyndel Safe 12/31/19 2322    Lucrezia Starch, MD 01/01/20 0000

## 2019-12-31 NOTE — Progress Notes (Signed)
Patient ID: Mary Cruz, female   DOB: 18-Sep-1975, 44 y.o.   MRN: [id number] Patient with uncomplicated appendicitis, covid test pending, will see in am, plan for lap appy if covid test negative

## 2020-01-01 ENCOUNTER — Observation Stay (HOSPITAL_COMMUNITY): Payer: Self-pay | Admitting: Anesthesiology

## 2020-01-01 ENCOUNTER — Encounter (HOSPITAL_COMMUNITY)
Admission: EM | Disposition: A | Discharge status: Home / Self Care | Payer: Self-pay | Source: Home / Self Care | Attending: Emergency Medicine

## 2020-01-01 DIAGNOSIS — K37 Unspecified appendicitis: Secondary | ICD-10-CM | POA: Diagnosis present

## 2020-01-01 HISTORY — PX: LAPAROSCOPIC APPENDECTOMY: SHX408

## 2020-01-01 LAB — RESP PANEL BY RT-PCR (FLU A&B, COVID) ARPGX2
Influenza A by PCR: NEGATIVE
Influenza B by PCR: NEGATIVE
SARS Coronavirus 2 by RT PCR: NEGATIVE

## 2020-01-01 SURGERY — APPENDECTOMY, LAPAROSCOPIC
Anesthesia: General | Site: Abdomen | Laterality: Right

## 2020-01-01 MED ORDER — PHENYLEPHRINE 40 MCG/ML (10ML) SYRINGE FOR IV PUSH (FOR BLOOD PRESSURE SUPPORT)
PREFILLED_SYRINGE | INTRAVENOUS | Status: AC
  Filled 2020-01-01: qty 30

## 2020-01-01 MED ORDER — HYDROMORPHONE HCL 1 MG/ML IJ SOLN
INTRAMUSCULAR | Status: DC | PRN
Start: 2020-01-01 — End: 2020-01-01
  Administered 2020-01-01: 1 mg via INTRAVENOUS

## 2020-01-01 MED ORDER — LIDOCAINE 2% (20 MG/ML) 5 ML SYRINGE
INTRAMUSCULAR | Status: AC
  Filled 2020-01-01: qty 5

## 2020-01-01 MED ORDER — OXYCODONE HCL 5 MG PO TABS
5.0000 mg | ORAL_TABLET | ORAL | Status: DC | PRN
  Administered 2020-01-01: 5 mg via ORAL

## 2020-01-01 MED ORDER — MIDAZOLAM HCL 5 MG/5ML IJ SOLN
INTRAMUSCULAR | Status: DC | PRN
  Administered 2020-01-01: 2 mg via INTRAVENOUS

## 2020-01-01 MED ORDER — ROCURONIUM BROMIDE 10 MG/ML (PF) SYRINGE
PREFILLED_SYRINGE | INTRAVENOUS | Status: DC | PRN
  Administered 2020-01-01: 100 mg via INTRAVENOUS

## 2020-01-01 MED ORDER — ONDANSETRON HCL 4 MG/2ML IJ SOLN
4.0000 mg | Freq: Four times a day (QID) | INTRAMUSCULAR | Status: DC | PRN
  Administered 2020-01-01: 4 mg via INTRAVENOUS
  Filled 2020-01-01: qty 2

## 2020-01-01 MED ORDER — DEXAMETHASONE SODIUM PHOSPHATE 10 MG/ML IJ SOLN
INTRAMUSCULAR | Status: AC
  Filled 2020-01-01: qty 1

## 2020-01-01 MED ORDER — FENTANYL CITRATE (PF) 100 MCG/2ML IJ SOLN
INTRAMUSCULAR | Status: AC
  Filled 2020-01-01: qty 2

## 2020-01-01 MED ORDER — METRONIDAZOLE IN NACL 5-0.79 MG/ML-% IV SOLN
500.0000 mg | Freq: Three times a day (TID) | INTRAVENOUS | Status: DC
  Administered 2020-01-01: 500 mg via INTRAVENOUS
  Filled 2020-01-01: qty 100

## 2020-01-01 MED ORDER — FENTANYL CITRATE (PF) 250 MCG/5ML IJ SOLN
INTRAMUSCULAR | Status: AC
  Filled 2020-01-01: qty 5

## 2020-01-01 MED ORDER — EPHEDRINE SULFATE-NACL 50-0.9 MG/10ML-% IV SOSY
PREFILLED_SYRINGE | INTRAVENOUS | Status: DC | PRN
  Administered 2020-01-01: 10 mg via INTRAVENOUS

## 2020-01-01 MED ORDER — HYDROMORPHONE HCL 2 MG/ML IJ SOLN
INTRAMUSCULAR | Status: AC
  Filled 2020-01-01: qty 1

## 2020-01-01 MED ORDER — MIDAZOLAM HCL 2 MG/2ML IJ SOLN
INTRAMUSCULAR | Status: AC
  Filled 2020-01-01: qty 2

## 2020-01-01 MED ORDER — DEXAMETHASONE SODIUM PHOSPHATE 10 MG/ML IJ SOLN
INTRAMUSCULAR | Status: DC | PRN
  Administered 2020-01-01: 10 mg via INTRAVENOUS

## 2020-01-01 MED ORDER — ONDANSETRON HCL 4 MG/2ML IJ SOLN
INTRAMUSCULAR | Status: AC
  Filled 2020-01-01: qty 2

## 2020-01-01 MED ORDER — 0.9 % SODIUM CHLORIDE (POUR BTL) OPTIME
TOPICAL | Status: DC | PRN
  Administered 2020-01-01: 1000 mL

## 2020-01-01 MED ORDER — BUPIVACAINE HCL 0.25 % IJ SOLN
INTRAMUSCULAR | Status: AC
  Filled 2020-01-01: qty 1

## 2020-01-01 MED ORDER — MORPHINE SULFATE (PF) 2 MG/ML IV SOLN
2.0000 mg | INTRAVENOUS | Status: DC | PRN
  Administered 2020-01-01 (×2): 2 mg via INTRAVENOUS
  Filled 2020-01-01 (×2): qty 1

## 2020-01-01 MED ORDER — LIDOCAINE 2% (20 MG/ML) 5 ML SYRINGE
INTRAMUSCULAR | Status: DC | PRN
  Administered 2020-01-01: 100 mg via INTRAVENOUS

## 2020-01-01 MED ORDER — SODIUM CHLORIDE 0.9 % IV SOLN: INTRAVENOUS | Status: DC

## 2020-01-01 MED ORDER — ONDANSETRON HCL 4 MG/2ML IJ SOLN
INTRAMUSCULAR | Status: DC | PRN
  Administered 2020-01-01: 4 mg via INTRAVENOUS

## 2020-01-01 MED ORDER — KETAMINE HCL 10 MG/ML IJ SOLN
INTRAMUSCULAR | Status: AC
  Filled 2020-01-01: qty 1

## 2020-01-01 MED ORDER — PROPOFOL 10 MG/ML IV BOLUS
INTRAVENOUS | Status: DC | PRN
  Administered 2020-01-01: 150 mg via INTRAVENOUS

## 2020-01-01 MED ORDER — HYDROCHLOROTHIAZIDE 12.5 MG PO CAPS: 12.5000 mg | ORAL_CAPSULE | Freq: Every day | ORAL | Status: DC

## 2020-01-01 MED ORDER — LISINOPRIL 10 MG PO TABS: 10.0000 mg | ORAL_TABLET | Freq: Every day | ORAL | Status: DC

## 2020-01-01 MED ORDER — FENTANYL CITRATE (PF) 250 MCG/5ML IJ SOLN
INTRAMUSCULAR | Status: DC | PRN
  Administered 2020-01-01: 100 ug via INTRAVENOUS

## 2020-01-01 MED ORDER — EPHEDRINE 5 MG/ML INJ
INTRAVENOUS | Status: AC
  Filled 2020-01-01: qty 10

## 2020-01-01 MED ORDER — SODIUM CHLORIDE 0.9 % IV SOLN
2.0000 g | INTRAVENOUS | Status: DC
  Administered 2020-01-01: 2 g via INTRAVENOUS
  Filled 2020-01-01: qty 20

## 2020-01-01 MED ORDER — FENTANYL CITRATE (PF) 100 MCG/2ML IJ SOLN: 25.0000 ug | INTRAMUSCULAR | Status: DC | PRN

## 2020-01-01 MED ORDER — OXYCODONE HCL 5 MG PO TABS
ORAL_TABLET | ORAL | Status: AC
  Filled 2020-01-01: qty 1

## 2020-01-01 MED ORDER — ATROPINE SULFATE 0.4 MG/ML IJ SOLN
INTRAMUSCULAR | Status: DC | PRN
  Administered 2020-01-01: .4 mg via INTRAVENOUS

## 2020-01-01 MED ORDER — ENOXAPARIN SODIUM 40 MG/0.4ML ~~LOC~~ SOLN
40.0000 mg | SUBCUTANEOUS | Status: DC
  Administered 2020-01-01: 40 mg via SUBCUTANEOUS
  Filled 2020-01-01: qty 0.4

## 2020-01-01 MED ORDER — KETAMINE HCL 10 MG/ML IJ SOLN
INTRAMUSCULAR | Status: DC | PRN
  Administered 2020-01-01: 50 mg via INTRAVENOUS

## 2020-01-01 MED ORDER — SUGAMMADEX SODIUM 200 MG/2ML IV SOLN
INTRAVENOUS | Status: DC | PRN
  Administered 2020-01-01: 450 mg via INTRAVENOUS

## 2020-01-01 MED ORDER — ACETAMINOPHEN 325 MG PO TABS: 650.0000 mg | ORAL_TABLET | Freq: Four times a day (QID) | ORAL | Status: DC | PRN

## 2020-01-01 MED ORDER — PROPOFOL 10 MG/ML IV BOLUS
INTRAVENOUS | Status: AC
  Filled 2020-01-01: qty 20

## 2020-01-01 MED ORDER — LACTATED RINGERS IV SOLN: INTRAVENOUS | Status: DC | PRN

## 2020-01-01 MED ORDER — KETOROLAC TROMETHAMINE 15 MG/ML IJ SOLN: 15.0000 mg | Freq: Once | INTRAMUSCULAR | Status: DC

## 2020-01-01 MED ORDER — SUGAMMADEX SODIUM 500 MG/5ML IV SOLN
INTRAVENOUS | Status: AC
  Filled 2020-01-01: qty 5

## 2020-01-01 MED ORDER — ACETAMINOPHEN 650 MG RE SUPP: 650.0000 mg | Freq: Four times a day (QID) | RECTAL | Status: DC | PRN

## 2020-01-01 MED ORDER — PHENYLEPHRINE 40 MCG/ML (10ML) SYRINGE FOR IV PUSH (FOR BLOOD PRESSURE SUPPORT)
PREFILLED_SYRINGE | INTRAVENOUS | Status: AC
  Filled 2020-01-01: qty 50

## 2020-01-01 MED ORDER — OXYCODONE HCL 5 MG PO TABS
5.0000 mg | ORAL_TABLET | Freq: Four times a day (QID) | ORAL | 0 refills | Status: DC | PRN
Start: 2020-01-01 — End: 2020-04-06

## 2020-01-01 MED ORDER — LACTATED RINGERS IR SOLN
Status: DC | PRN
  Administered 2020-01-01: 1000 mL

## 2020-01-01 MED ORDER — ONDANSETRON 4 MG PO TBDP: 4.0000 mg | ORAL_TABLET | Freq: Four times a day (QID) | ORAL | Status: DC | PRN

## 2020-01-01 MED ORDER — ROCURONIUM BROMIDE 10 MG/ML (PF) SYRINGE
PREFILLED_SYRINGE | INTRAVENOUS | Status: AC
  Filled 2020-01-01: qty 10

## 2020-01-01 SURGICAL SUPPLY — 44 items
CABLE HIGH FREQUENCY MONO STRZ (ELECTRODE) ×2 IMPLANT
CHLORAPREP W/TINT 26 (MISCELLANEOUS) ×2 IMPLANT
COVER SURGICAL LIGHT HANDLE (MISCELLANEOUS) ×2 IMPLANT
COVER WAND RF STERILE (DRAPES) IMPLANT
CUTTER FLEX LINEAR 45M (STAPLE) ×2 IMPLANT
DECANTER SPIKE VIAL GLASS SM (MISCELLANEOUS) ×2 IMPLANT
DERMABOND ADVANCED (GAUZE/BANDAGES/DRESSINGS) ×1
DERMABOND ADVANCED .7 DNX12 (GAUZE/BANDAGES/DRESSINGS) ×1 IMPLANT
DEVICE TROCAR PUNCTURE CLOSURE (ENDOMECHANICALS) ×2 IMPLANT
DRAPE LAPAROSCOPIC ABDOMINAL (DRAPES) ×2 IMPLANT
ELECT REM PT RETURN 15FT ADLT (MISCELLANEOUS) ×2 IMPLANT
GLOVE BIO SURGEON STRL SZ7 (GLOVE) ×4 IMPLANT
GLOVE BIOGEL PI IND STRL 7.0 (GLOVE) ×1 IMPLANT
GLOVE BIOGEL PI IND STRL 7.5 (GLOVE) ×2 IMPLANT
GLOVE BIOGEL PI INDICATOR 7.0 (GLOVE) ×1
GLOVE BIOGEL PI INDICATOR 7.5 (GLOVE) ×2
GOWN STRL REUS W/ TWL XL LVL3 (GOWN DISPOSABLE) ×1 IMPLANT
GOWN STRL REUS W/TWL LRG LVL3 (GOWN DISPOSABLE) ×4 IMPLANT
GOWN STRL REUS W/TWL XL LVL3 (GOWN DISPOSABLE) ×4 IMPLANT
KIT BASIN OR (CUSTOM PROCEDURE TRAY) ×2 IMPLANT
KIT TURNOVER KIT A (KITS) IMPLANT
PENCIL SMOKE EVACUATOR (MISCELLANEOUS) IMPLANT
POUCH RETRIEVAL ECOSAC 10 (ENDOMECHANICALS) ×1 IMPLANT
POUCH RETRIEVAL ECOSAC 10MM (ENDOMECHANICALS) ×2
RELOAD 45 VASCULAR/THIN (ENDOMECHANICALS) IMPLANT
RELOAD STAPLE TA45 3.5 REG BLU (ENDOMECHANICALS) ×2 IMPLANT
SCISSORS LAP 5X35 DISP (ENDOMECHANICALS) ×2 IMPLANT
SET IRRIG TUBING LAPAROSCOPIC (IRRIGATION / IRRIGATOR) ×2 IMPLANT
SET TUBE SMOKE EVAC HIGH FLOW (TUBING) ×2 IMPLANT
SHEARS HARMONIC ACE PLUS 36CM (ENDOMECHANICALS) ×2 IMPLANT
SLEEVE XCEL OPT CAN 5 100 (ENDOMECHANICALS) ×2 IMPLANT
SOL ANTI FOG 6CC (MISCELLANEOUS) ×1 IMPLANT
SOLUTION ANTI FOG 6CC (MISCELLANEOUS) ×1
STRIP CLOSURE SKIN 1/4X4 (GAUZE/BANDAGES/DRESSINGS) ×2 IMPLANT
SUT MNCRL AB 4-0 PS2 18 (SUTURE) ×2 IMPLANT
SUT VICRYL 0 ENDOLOOP (SUTURE) IMPLANT
SUT VICRYL 0 UR6 27IN ABS (SUTURE) ×2 IMPLANT
TOWEL OR 17X26 10 PK STRL BLUE (TOWEL DISPOSABLE) ×2 IMPLANT
TOWEL OR NON WOVEN STRL DISP B (DISPOSABLE) ×2 IMPLANT
TRAY FOLEY MTR SLVR 14FR STAT (SET/KITS/TRAYS/PACK) ×2 IMPLANT
TRAY FOLEY MTR SLVR 16FR STAT (SET/KITS/TRAYS/PACK) IMPLANT
TRAY LAPAROSCOPIC (CUSTOM PROCEDURE TRAY) ×2 IMPLANT
TROCAR XCEL BLUNT TIP 100MML (ENDOMECHANICALS) ×2 IMPLANT
TROCAR XCEL NON-BLD 5MMX100MML (ENDOMECHANICALS) ×2 IMPLANT

## 2020-01-01 NOTE — Anesthesia Postprocedure Evaluation (Signed)
Anesthesia Post Note  Patient: Mary Cruz  Procedure(s) Performed: APPENDECTOMY LAPAROSCOPIC (Right Abdomen)     Patient location during evaluation: PACU Anesthesia Type: General Level of consciousness: awake and alert Pain management: pain level controlled Vital Signs Assessment: post-procedure vital signs reviewed and stable Respiratory status: spontaneous breathing, nonlabored ventilation, respiratory function stable and patient connected to nasal cannula oxygen Cardiovascular status: blood pressure returned to baseline and stable Postop Assessment: no apparent nausea or vomiting Anesthetic complications: no   No complications documented.  Last Vitals:  Vitals:   01/01/20 1045 01/01/20 1057  BP: (!) 133/91 123/85  Pulse: 68 77  Resp: 15 20  Temp:  36.7 C  SpO2: 95% 94%    Last Pain:  Vitals:   01/01/20 1042  TempSrc:   PainSc: 6                  [name] L Lyrik Dockstader

## 2020-01-01 NOTE — Discharge Instructions (Signed)
[facility] LAPAROSCOPIC SURGERY: POST OP INSTRUCTIONS  Always review your discharge instruction sheet given to you by the facility where your surgery was performed. IF YOU HAVE DISABILITY OR FAMILY LEAVE FORMS, YOU MUST BRING THEM TO THE OFFICE FOR PROCESSING.   DO NOT GIVE THEM TO YOUR DOCTOR.  1. A prescription for pain medication may be given to you upon discharge.  Take your pain medication as prescribed, if needed.  If narcotic pain medicine is not needed, then you may take acetaminophen (Tylenol), naprosyn (Alleve), or ibuprofen (Advil) as needed. 2. Take your usually prescribed medications unless otherwise directed. 3. If you need a refill on your pain medication, please contact your pharmacy.  They will contact our office to request authorization. Prescriptions will not be filled after 5pm or on week-ends. 4. You should follow a light diet the first few days after arrival home, such as soup and crackers, etc.  Be sure to include lots of fluids daily. 5. Most patients will experience some swelling and bruising in the area of the incisions.  Ice packs will help.  Swelling and bruising can take several days to resolve.  6. It is common to experience some constipation if taking pain medication after surgery.  Increasing fluid intake and taking a stool softener (such as Colace) will usually help or prevent this problem from occurring.  A mild laxative (Milk of Magnesia or Miralax) should be taken according to package instructions if there are no bowel movements after 48 hours. 7. Unless discharge instructions indicate otherwise, you may remove your bandages 48 hours after surgery, and you may shower at that time.  You may have steri-strips (small skin tapes) in place directly over the incision.  These strips should be left on the skin for 7-10 days.  If your surgeon used skin glue on the incision, you may shower in 24 hours.  The glue will flake  off over the next 2-3 weeks.  Any sutures or staples will be removed at the office during your follow-up visit. 8. ACTIVITIES:  You may resume regular (light) daily activities beginning the next day--such as daily self-care, walking, climbing stairs--gradually increasing activities as tolerated.  You may have sexual intercourse when it is comfortable.  Refrain from any heavy lifting or straining until approved by your doctor. a. You may drive when you are no longer taking prescription pain medication, you can comfortably wear a seatbelt, and you can safely maneuver your car and apply brakes. b. RETURN TO WORK:  __________________________________________________________ 9. You should see your doctor in the office for a follow-up appointment approximately 2-3 weeks after your surgery.  Make sure that you call for this appointment within a day or two after you arrive home to insure a convenient appointment time. 10. OTHER INSTRUCTIONS: __________________________________________________________________________________________________________________________ __________________________________________________________________________________________________________________________ WHEN TO CALL YOUR DOCTOR: 1. Fever over 101.0 2. Inability to urinate 3. Continued bleeding from incision. 4. Increased pain, redness, or drainage from the incision. 5. Increasing abdominal pain  The clinic staff is available to answer your questions during regular business hours.  Please don't hesitate to call and ask to speak to one of the nurses for clinical concerns.  If you have a medical emergency, go to the nearest emergency room or call 911.  A surgeon from [facility] is always on call at the hospital. [address], [address], [place], [place]  [address] ? [address] [address], [place], [place]   [address] [phone] ? [phone] ? [phone]   [phone] Web site: [email or web address]  

## 2020-01-01 NOTE — Op Note (Signed)
Preoperative diagnosis: Acute appendicitis Postoperative diagnosis: Acute separative appendicitis Procedure: Laparoscopic appendectomy Surgeon: Dr. Serita Grammes Anesthesia: General Estimated blood loss: Minimal Complications: None Drains: None Specimens: Appendix to pathology Sponge needle count was correct at completion Disposition recovery stable condition  Indications: This a 44 year old female whose had right lower quadrant pain for 3 days and a CT scan that indicates appendicitis.  She has right lower quadrant pain on her exam.  I discussed going to the operating room for laparoscopic appendectomy.  Procedure: After informed consent was obtained the patient was taken to the operating room.  She was given antibiotics.  SCDs were placed.  She was placed under general anesthesia without complication.  She was prepped and draped in the standard sterile surgical fashion.  A surgical timeout was then performed.  I filtrated Marcaine below her umbilicus.  I then made a vertical incision.  I grasped her fascia and incised this sharply.  The peritoneum was entered bluntly.  I then placed a 0 Vicryl pursestring sutures through the fascia.  I inserted a Hassan trocar and insufflated the abdomen to 15 mmHg pressure.  She was then placed in the Trendelenburg position.  I then inserted two 5 mm trochars in the lower abdomen under direct vision without complication.  Her appendix was noted to be acutely suppurative but not perforated.  I then was able to dissect the appendiceal mesentery and divide this with the harmonic scalpel.  I took this down to the base of the appendix at the cecum.  I divided this with a GIA stapler with a blue load.  I then placed this in a retrieval bag and removed it from the abdomen.  The staple line was hemostatic.  There was no evidence of any injury.  I then remove the appendix in the retrieval bag.  I remove the Hassan trocar and tied my pursestring down.  I then placed 2  additional 0 Vicryl sutures to completely obliterate the umbilical defect.  I then desufflated the abdomen to remove the trocars.  I closed these with 4-0 Monocryl and glue.  She tolerated this well was extubated and transferred to recovery stable.

## 2020-01-01 NOTE — Transfer of Care (Signed)
Immediate Anesthesia Transfer of Care Note  Patient: Mary Cruz  Procedure(s) Performed: APPENDECTOMY LAPAROSCOPIC (Right Abdomen)  Patient Location: PACU  Anesthesia Type:General  Level of Consciousness: awake, alert , oriented and patient cooperative  Airway & Oxygen Therapy: Patient Spontanous Breathing and Patient connected to face mask oxygen  Post-op Assessment: Report given to RN, Post -op Vital signs reviewed and stable and Patient moving all extremities  Post vital signs: Reviewed and stable  Last Vitals:  Vitals Value Taken Time  BP 143/86 01/01/20 0956  Temp    Pulse 100 01/01/20 0959  Resp 14 01/01/20 0959  SpO2 100 % 01/01/20 0959  Vitals shown include unvalidated device data.  Last Pain:  Vitals:   01/01/20 0656  TempSrc:   PainSc: 2          Complications: No complications documented.

## 2020-01-01 NOTE — Anesthesia Procedure Notes (Signed)
Procedure Name: Intubation Date/Time: 01/01/2020 9:06 AM Performed by: Mitzie Na, CRNA Pre-anesthesia Checklist: Patient identified, Emergency Drugs available, Suction available and Patient being monitored Patient Re-evaluated:Patient Re-evaluated prior to induction Oxygen Delivery Method: Circle system utilized Preoxygenation: Pre-oxygenation with 100% oxygen Induction Type: IV induction Ventilation: Mask ventilation without difficulty Laryngoscope Size: Mac and 3 Grade View: Grade I Tube type: Oral Tube size: 7.5 mm Number of attempts: 1 Airway Equipment and Method: Stylet and Oral airway Placement Confirmation: ETT inserted through vocal cords under direct vision,  positive ETCO2 and breath sounds checked- equal and bilateral Secured at: 24 cm Tube secured with: Tape Dental Injury: Teeth and Oropharynx as per pre-operative assessment

## 2020-01-01 NOTE — ED Notes (Signed)
Report given to [facility] in [facility].

## 2020-01-01 NOTE — Anesthesia Preprocedure Evaluation (Addendum)
Anesthesia Evaluation  Patient identified by MRN, date of birth, ID band Patient awake    Reviewed: Allergy & Precautions, NPO status , Patient's Chart, lab work & pertinent test results  Airway Mallampati: III  TM Distance: >3 FB Neck ROM: Full    Dental  (+) Chipped, Dental Advisory Given,    Pulmonary asthma , former smoker,    Pulmonary exam normal breath sounds clear to auscultation       Cardiovascular hypertension, Pt. on medications Normal cardiovascular exam Rhythm:Regular Rate:Normal     Neuro/Psych PSYCHIATRIC DISORDERS Anxiety Depression negative neurological ROS     GI/Hepatic Neg liver ROS, GERD  Medicated and Controlled,  Endo/Other  Morbid obesityLupus  Renal/GU negative Renal ROS  negative genitourinary   Musculoskeletal  (+) Arthritis , Fibromyalgia - (spinal cord stimulator in place, turned off per patient), narcotic dependent  Abdominal   Peds  Hematology negative hematology ROS (+)   Anesthesia Other Findings   Reproductive/Obstetrics                            Anesthesia Physical Anesthesia Plan  ASA: III  Anesthesia Plan: General   Post-op Pain Management:    Induction: Intravenous  PONV Risk Score and Plan: 3 and Midazolam, Dexamethasone and Ondansetron  Airway Management Planned: Oral ETT  Additional Equipment:   Intra-op Plan:   Post-operative Plan: Extubation in OR  Informed Consent: I have reviewed the patients History and Physical, chart, labs and discussed the procedure including the risks, benefits and alternatives for the proposed anesthesia with the patient or authorized representative who has indicated his/her understanding and acceptance.     Dental advisory given  Plan Discussed with: CRNA  Anesthesia Plan Comments:         Anesthesia Quick Evaluation

## 2020-01-01 NOTE — H&P (Signed)
Mary Cruz is an 44 y.o. female.   Chief Complaint: rlq pain HPI: 8 yof with 3 days rlq pain that has been progressive.  Nothing she was doing at home was making it better.  Has prior history of kidney stones and that's what she thought this was.  She had nausea, no emesis. Is passing flatus.  No fevers.  Not eating as much, ? Blood in urine was noted.  She presented here and was found on renal stone ct scan to have dilated appendix with some inflammatory changes and I was called.   Past Medical History:  Diagnosis Date  . Abscess of axillary fold 10/10/2013  . Acute bronchitis with asthma with acute exacerbation 01/23/2015  . Dehydration 04/19/2019  . Elevated lactic acid level 04/19/2019  . Encounter for management of implanted device 12/19/2014  . Encounter for therapeutic drug level monitoring 12/19/2014  . Fibromyalgia   . Hyperglycemia 04/19/2019  . Hypertension   . Implant pain at battery site (right buttocks area) 04/05/2015  . KIDNEY STONES 07/02/2007   Qualifier: History of  By: Bobby Rumpf CMA (AAMA), [place]    . Long term current use of opiate analgesic 12/19/2014  . Lupus ([place])   . Morbid obesity ([place]) 12/19/2014  . Muscle cramping 12/19/2014  . Opiate use (30 MME/Day) 12/19/2014  . Osteoarthritis   . Pain    chronic regional pain syndrome  . Right knee pain 01/10/2015  . Sepsis ([place]) 04/19/2019  . Sepsis affecting skin 10/10/2013  . Tobacco abuse 04/19/2019    Past Surgical History:  Procedure Laterality Date  . BREAST SURGERY    . CHOLECYSTECTOMY  2000  . ENDOMETRIAL ABLATION    . KNEE SURGERY    . KNEE SURGERY Right 02-2015  . OCCIPITAL NERVE STIMULATOR INSERTION    . SHOULDER ARTHROSCOPY WITH DISTAL CLAVICLE RESECTION Right 10/29/2018   Procedure: SHOULDER ARTHROSCOPY WITH DISTAL CLAVICLE RESECTION;  Surgeon: Hiram Gash, MD;  Location: [facility];  Service: Orthopedics;  Laterality: Right;  . SHOULDER SURGERY    . spinal cord stimulator    . SPINAL CORD  STIMULATOR IMPLANT  2008  . SPINAL CORD STIMULATOR INSERTION N/A 06/21/2016   Procedure: LUMBAR SPINAL CORD STIMULATOR INSERTION;  Surgeon: Clydell Hakim, MD;  Location: [facility];  Service: Neurosurgery;  Laterality: N/A;  LUMBAR SPINAL CORD STIMULATOR INSERTION  . SUBACROMIAL DECOMPRESSION Right 10/29/2018   Procedure: SUBACROMIAL DECOMPRESSION;  Surgeon: Hiram Gash, MD;  Location: [facility];  Service: Orthopedics;  Laterality: Right;  . TUBAL LIGATION  2000    Family History  Problem Relation Age of Onset  . Depression Mother        maternal grandmother  . Asthma Mother   . Fibromyalgia Other        aunt  . Stroke Other        grandmother  . Thyroid disease Other        grandmother   Social History:  reports that she has quit smoking. Her smoking use included cigarettes. She quit smokeless tobacco use about 2 years ago. She reports that she does not drink alcohol and does not use drugs.  Allergies:  Allergies  Allergen Reactions  . Gabapentin Swelling  . Celebrex [Celecoxib] Swelling    "Body swelling"  . Pentazocine Lactate Itching and Other (See Comments)    HALLUCINATIONS  . Seroquel [Quetiapine] Other (See Comments)    Falls  . Amoxicillin Diarrhea and Nausea And Vomiting  . Bactrim [  Sulfamethoxazole-Trimethoprim] Other (See Comments)    "Makes very sick"  . Doxycycline Other (See Comments)    "SICKNESS"  . Erythromycin Other (See Comments)    RESULTANT UTI  . Hydromorphone Hcl Other (See Comments)    HALLUCINATIONS  . Keflex [Cephalexin] Other (See Comments)    Nausea and causes uti's  . Latex Itching  . Lyrica [Pregabalin] Other (See Comments)    "Too sleepy"  . Nabumetone Itching    meds reviewed   Results for orders placed or performed during the hospital encounter of 12/31/19 (from the past 48 hour(s))  Urinalysis, Routine w reflex microscopic Urine, Clean Catch     Status: None   Collection Time: 12/31/19  8:31 PM  Result Value Ref  Range   Color, Urine YELLOW YELLOW   APPearance CLEAR CLEAR   Specific Gravity, Urine 1.018 1.005 - 1.030   pH 5.0 5.0 - 8.0   Glucose, UA NEGATIVE NEGATIVE mg/dL   Hgb urine dipstick NEGATIVE NEGATIVE   Bilirubin Urine NEGATIVE NEGATIVE   Ketones, ur NEGATIVE NEGATIVE mg/dL   Protein, ur NEGATIVE NEGATIVE mg/dL   Nitrite NEGATIVE NEGATIVE   Leukocytes,Ua NEGATIVE NEGATIVE    Comment: Performed at [facility], [place] [address] [place], [place] [address]  Basic metabolic panel     Status: Abnormal   Collection Time: 12/31/19  8:31 PM  Result Value Ref Range   Sodium 138 135 - 145 mmol/L   Potassium 3.0 (L) 3.5 - 5.1 mmol/L   Chloride 100 98 - 111 mmol/L   CO2 27 22 - 32 mmol/L   Glucose, Bld 122 (H) 70 - 99 mg/dL    Comment: Glucose reference range applies only to samples taken after fasting for at least 8 hours.   BUN 7 6 - 20 mg/dL   Creatinine, Ser 0.95 0.44 - 1.00 mg/dL   Calcium 9.1 8.9 - 10.3 mg/dL   GFR, Estimated >60 >60 mL/min    Comment: (NOTE) Calculated using the CKD-EPI Creatinine Equation (2021)    Anion gap 11 5 - 15    Comment: Performed at [facility], [place] [address], [place], [place] [address]  CBC     Status: None   Collection Time: 12/31/19  8:31 PM  Result Value Ref Range   WBC 8.1 4.0 - 10.5 K/uL   RBC 4.40 3.87 - 5.11 MIL/uL   Hemoglobin 12.9 12.0 - 15.0 g/dL   HCT 40.1 36 - 46 %   MCV 91.1 80.0 - 100.0 fL   MCH 29.3 26.0 - 34.0 pg   MCHC 32.2 30.0 - 36.0 g/dL   RDW 13.8 11.5 - 15.5 %   Platelets 286 150 - 400 K/uL   nRBC 0.0 0.0 - 0.2 %    Comment: Performed at [facility], [place] [address], [place], [place] [address]  I-Stat beta hCG blood, ED     Status: None   Collection Time: 12/31/19  8:59 PM  Result Value Ref Range   I-stat hCG, quantitative <5.0 <5 mIU/mL   Comment 3            Comment:   GEST. AGE      CONC.  (mIU/mL)   <=1 WEEK        5 - 50     2 WEEKS       50 - 500     3  WEEKS       100 - 10,000  4 WEEKS     1,000 - 30,000        FEMALE AND NON-PREGNANT FEMALE:     LESS THAN 5 mIU/mL   Resp Panel by RT-PCR (Flu A&B, Covid) Nasopharyngeal Swab     Status: None   Collection Time: 12/31/19 11:07 PM   Specimen: Nasopharyngeal Swab; Nasopharyngeal(NP) swabs in vial transport medium  Result Value Ref Range   SARS Coronavirus 2 by RT PCR NEGATIVE NEGATIVE    Comment: (NOTE) SARS-CoV-2 target nucleic acids are NOT DETECTED.  The SARS-CoV-2 RNA is generally detectable in upper respiratory specimens during the acute phase of infection. The lowest concentration of SARS-CoV-2 viral copies this assay can detect is 138 copies/mL. A negative result does not preclude SARS-Cov-2 infection and should not be used as the sole basis for treatment or other patient management decisions. A negative result may occur with  improper specimen collection/handling, submission of specimen other than nasopharyngeal swab, presence of viral mutation(s) within the areas targeted by this assay, and inadequate number of viral copies(<138 copies/mL). A negative result must be combined with clinical observations, patient history, and epidemiological information. The expected result is Negative.  Fact Sheet for Patients:  EntrepreneurPulse.com.au  Fact Sheet for Healthcare Providers:  IncredibleEmployment.be  This test is no t yet approved or cleared by the [place] FDA and  has been authorized for detection and/or diagnosis of SARS-CoV-2 by FDA under an Emergency Use Authorization (EUA). This EUA will remain  in effect (meaning this test can be used) for the duration of the COVID-19 declaration under Section 564(b)(1) of the Act, 21 U.S.C.section 360bbb-3(b)(1), unless the authorization is terminated  or revoked sooner.       Influenza A by PCR NEGATIVE NEGATIVE   Influenza B by PCR NEGATIVE NEGATIVE    Comment: (NOTE) The Xpert Xpress  SARS-CoV-2/FLU/RSV plus assay is intended as an aid in the diagnosis of influenza from Nasopharyngeal swab specimens and should not be used as a sole basis for treatment. Nasal washings and aspirates are unacceptable for Xpert Xpress SARS-CoV-2/FLU/RSV testing.  Fact Sheet for Patients: EntrepreneurPulse.com.au  Fact Sheet for Healthcare Providers: IncredibleEmployment.be  This test is not yet approved or cleared by the [place] FDA and has been authorized for detection and/or diagnosis of SARS-CoV-2 by FDA under an Emergency Use Authorization (EUA). This EUA will remain in effect (meaning this test can be used) for the duration of the COVID-19 declaration under Section 564(b)(1) of the Act, 21 U.S.C. section 360bbb-3(b)(1), unless the authorization is terminated or revoked.  Performed at [facility], [place] [address] [place], [place] [address]    CT Renal Stone Study  Result Date: 12/31/2019 CLINICAL DATA:  Bilateral flank pain and nausea EXAM: CT ABDOMEN AND PELVIS WITHOUT CONTRAST TECHNIQUE: Multidetector CT imaging of the abdomen and pelvis was performed following the standard protocol without IV contrast. COMPARISON:  None. FINDINGS: Lower chest: The visualized heart size within normal limits. No pericardial fluid/thickening. No hiatal hernia. The visualized portions of the lungs are clear. Hepatobiliary: Although limited due to the lack of intravenous contrast, normal in appearance without gross focal abnormality. The patient is status post cholecystectomy. No biliary ductal dilation. Pancreas:  Unremarkable.  No surrounding inflammatory changes. Spleen: Normal in size. Although limited due to the lack of intravenous contrast, normal in appearance. Adrenals/Urinary Tract: Both adrenal glands appear normal. The kidneys and collecting system appear normal without evidence of urinary tract calculus or hydronephrosis. Bladder is  unremarkable. Stomach/Bowel: The stomach,  small bowel, and colon are normal in appearance. No inflammatory changes or obstructive findings. There is a mildly dilated appendix measuring up to 9 mm with wall thickening with surrounding fat stranding changes. There is scattered small lymph nodes seen within the right lower quadrant. No loculated fluid collections or free air. Vascular/Lymphatic: There are no enlarged abdominal or pelvic lymph nodes. No significant gross vascular findings are present given the lack of intravenous contrast. Reproductive: The uterus and adnexa are unremarkable. Other: No evidence of abdominal wall mass or hernia. Musculoskeletal: No acute or significant osseous findings. IMPRESSION: Findings suggestive of acute appendicitis. No surrounding loculated fluid collections or free air. Electronically Signed   By: Prudencio Pair M.D.   On: 12/31/2019 22:18    Review of Systems  Gastrointestinal: Positive for abdominal pain and nausea.  All other systems reviewed and are negative.   Blood pressure 101/72, pulse (!) 53, temperature 97.6 F (36.4 C), temperature source Oral, resp. rate 13, SpO2 95 %. Physical Exam Constitutional:      Appearance: She is obese.  HENT:     Head: Normocephalic and atraumatic.     Mouth/Throat:     Mouth: Mucous membranes are moist.  Eyes:     Extraocular Movements: Extraocular movements intact.     Conjunctiva/sclera: Conjunctivae normal.     Pupils: Pupils are equal, round, and reactive to light.  Cardiovascular:     Rate and Rhythm: Normal rate and regular rhythm.  Pulmonary:     Effort: Pulmonary effort is normal.     Breath sounds: Normal breath sounds.  Abdominal:     General: Bowel sounds are normal. There is no distension (rlq).     Palpations: Abdomen is soft.     Tenderness: There is abdominal tenderness (rlq).     Hernia: No hernia is present.  Musculoskeletal:     Right lower leg: No edema.     Left lower leg: No edema.   Skin:    General: Skin is warm and dry.     Capillary Refill: Capillary refill takes less than 2 seconds.  Neurological:     General: No focal deficit present.     Mental Status: She is alert.  Psychiatric:        Mood and Affect: Mood normal.        Behavior: Behavior normal.      Assessment/Plan Appendicitis Discussed pathophysiology of appendicitis.  Discussed abx vs surgery. Recommended surgery and this is what she would like to proceed with. Discussed surgery, risks and recovery. If not ruptured as it appears plan to [place] today from pacu.     Rolm Bookbinder, MD 01/01/2020, 7:49 AM

## 2020-01-02 ENCOUNTER — Encounter (HOSPITAL_COMMUNITY): Payer: Self-pay | Admitting: General Surgery

## 2020-01-02 LAB — URINE CULTURE

## 2020-01-02 NOTE — Discharge Summary (Signed)
Physician Discharge Summary  Patient ID: Mary Cruz MRN: [id number] DOB/AGE: April 03, 1975 44 y.o.  Admit date: 12/31/2019 Discharge date: 01/02/2020  Admission Diagnoses: Appendicitis  Discharge Diagnoses:  Active Problems:   Appendicitis   Discharged Condition: good  Hospital Course: 35 yof from er who underwent laparoscopic appendectomy for acute appendicitis, discharged doing well from pacu  Consults: None  Significant Diagnostic Studies: none  Treatments: surgery: laparoscopic appendectomy    Disposition: Discharge disposition: 01-Home or Self Care        Allergies as of 01/01/2020      Reactions   Gabapentin Swelling   Celebrex [celecoxib] Swelling   "Body swelling"   Pentazocine Lactate Itching, Other (See Comments)   HALLUCINATIONS   Seroquel [quetiapine] Other (See Comments)   Falls   Amoxicillin Diarrhea, Nausea And Vomiting   Bactrim [sulfamethoxazole-trimethoprim] Other (See Comments)   "Makes very sick"   Doxycycline Other (See Comments)   "SICKNESS"   Erythromycin Other (See Comments)   RESULTANT UTI   Hydromorphone Hcl Other (See Comments)   HALLUCINATIONS   Keflex [cephalexin] Other (See Comments)   Nausea and causes uti's   Latex Itching   Lyrica [pregabalin] Other (See Comments)   "Too sleepy"   Nabumetone Itching      Medication List    TAKE these medications   albuterol 108 (90 Base) MCG/ACT inhaler Commonly known as: VENTOLIN HFA Inhale 1-2 puffs into the lungs every 6 (six) hours as needed for wheezing or shortness of breath.   ALIGN PO Take by mouth daily.   azithromycin 250 MG tablet Commonly known as: ZITHROMAX Take 1 tablet (250 mg total) by mouth daily. Take first 2 tablets together, then 1 every day until finished.   clonazePAM 0.5 MG tablet Commonly known as: KLONOPIN Take 0.5-1 tablets (0.25-0.5 mg total) by mouth 2 (two) times daily as needed for anxiety.   DULoxetine 60 MG capsule Commonly known as:  CYMBALTA Take 2 capsules (120 mg total) by mouth daily.   hyoscyamine 0.125 MG SL tablet Commonly known as: LEVSIN SL Place under the tongue.   lisinopril-hydrochlorothiazide 10-12.5 MG tablet Commonly known as: ZESTORETIC Take 1 tablet by mouth daily.   methocarbamol 750 MG tablet Commonly known as: ROBAXIN Take 1 tablet (750 mg total) by mouth every 8 (eight) hours as needed for muscle spasms.   omeprazole 40 MG capsule Commonly known as: PRILOSEC Take 1 capsule (40 mg total) by mouth daily.   oxyCODONE 5 MG immediate release tablet Commonly known as: Oxy IR/ROXICODONE Take 1 tablet (5 mg total) by mouth every 6 (six) hours as needed for severe pain. Must last 30 days What changed: Another medication with the same name was added. Make sure you understand how and when to take each.   oxyCODONE 5 MG immediate release tablet Commonly known as: Oxy IR/ROXICODONE Take 1 tablet (5 mg total) by mouth every 6 (six) hours as needed for severe pain. Must last 30 days What changed: Another medication with the same name was added. Make sure you understand how and when to take each.   oxyCODONE 5 MG immediate release tablet Commonly known as: Oxy IR/ROXICODONE Take 1 tablet (5 mg total) by mouth every 6 (six) hours as needed for severe pain. Must last 30 days What changed: Another medication with the same name was added. Make sure you understand how and when to take each.   oxyCODONE 5 MG immediate release tablet Commonly known as: Oxy IR/ROXICODONE Take 1 tablet (5 mg total)  by mouth every 6 (six) hours as needed for severe pain. Must last 30 days What changed: Another medication with the same name was added. Make sure you understand how and when to take each.   oxyCODONE 5 MG immediate release tablet Commonly known as: Oxy IR/ROXICODONE Take 1 tablet (5 mg total) by mouth every 6 (six) hours as needed for severe pain. What changed: You were already taking a medication with the same  name, and this prescription was added. Make sure you understand how and when to take each.   phentermine 37.5 MG capsule Take 1 capsule (37.5 mg total) by mouth every morning.   rizatriptan 10 MG tablet Commonly known as: Maxalt Take 1 tablet (10 mg total) by mouth as needed for migraine. May repeat in 2 hours if needed       [facility] In 3 weeks.   Specialty: General Surgery Contact information: [address] [place] [phone]              Signed: Rolm Bookbinder 01/02/2020, 7:59 AM

## 2020-01-04 LAB — SURGICAL PATHOLOGY

## 2020-04-06 ENCOUNTER — Other Ambulatory Visit: Payer: Self-pay

## 2020-04-06 ENCOUNTER — Encounter: Payer: Self-pay | Admitting: Pain Medicine

## 2020-04-06 ENCOUNTER — Ambulatory Visit: Payer: 59 | Attending: Pain Medicine | Admitting: Pain Medicine

## 2020-04-06 DIAGNOSIS — Z79899 Other long term (current) drug therapy: Secondary | ICD-10-CM | POA: Diagnosis present

## 2020-04-06 DIAGNOSIS — M25551 Pain in right hip: Secondary | ICD-10-CM

## 2020-04-06 DIAGNOSIS — M545 Low back pain, unspecified: Secondary | ICD-10-CM | POA: Insufficient documentation

## 2020-04-06 DIAGNOSIS — G894 Chronic pain syndrome: Secondary | ICD-10-CM

## 2020-04-06 DIAGNOSIS — F1120 Opioid dependence, uncomplicated: Secondary | ICD-10-CM | POA: Diagnosis present

## 2020-04-06 DIAGNOSIS — M79602 Pain in left arm: Secondary | ICD-10-CM | POA: Diagnosis present

## 2020-04-06 DIAGNOSIS — R252 Cramp and spasm: Secondary | ICD-10-CM | POA: Diagnosis present

## 2020-04-06 DIAGNOSIS — M797 Fibromyalgia: Secondary | ICD-10-CM | POA: Insufficient documentation

## 2020-04-06 DIAGNOSIS — M79601 Pain in right arm: Secondary | ICD-10-CM | POA: Diagnosis present

## 2020-04-06 DIAGNOSIS — M5416 Radiculopathy, lumbar region: Secondary | ICD-10-CM | POA: Diagnosis present

## 2020-04-06 DIAGNOSIS — M25552 Pain in left hip: Secondary | ICD-10-CM | POA: Diagnosis present

## 2020-04-06 DIAGNOSIS — G8929 Other chronic pain: Secondary | ICD-10-CM | POA: Insufficient documentation

## 2020-04-06 DIAGNOSIS — M542 Cervicalgia: Secondary | ICD-10-CM | POA: Insufficient documentation

## 2020-04-06 DIAGNOSIS — M79604 Pain in right leg: Secondary | ICD-10-CM | POA: Insufficient documentation

## 2020-04-06 DIAGNOSIS — M7918 Myalgia, other site: Secondary | ICD-10-CM

## 2020-04-06 DIAGNOSIS — M47816 Spondylosis without myelopathy or radiculopathy, lumbar region: Secondary | ICD-10-CM

## 2020-04-06 MED ORDER — OXYCODONE HCL 5 MG PO TABS: 5.0000 mg | ORAL_TABLET | Freq: Four times a day (QID) | ORAL | 0 refills | Status: DC | PRN

## 2020-04-06 MED ORDER — METHOCARBAMOL 750 MG PO TABS: 750.0000 mg | ORAL_TABLET | Freq: Three times a day (TID) | ORAL | 2 refills | Status: DC | PRN

## 2020-04-06 NOTE — Progress Notes (Signed)
PROVIDER NOTE: Information contained herein reflects review and annotations entered in association with encounter. Interpretation of such information and data should be left to medically-trained personnel. Information provided to patient can be located elsewhere in the medical record under "Patient Instructions". Document created using STT-dictation technology, any transcriptional errors that may result from process are unintentional.    Patient: Mary Cruz  Service Category: E/M  Provider: Gaspar Cola, MD  DOB: April 27, 1975  DOS: 04/06/2020  Specialty: Interventional Pain Management  MRN: [id number]  Setting: Ambulatory outpatient  PCP: Emeterio Reeve, DO  Type: Established Patient    Referring Provider: Emeterio Reeve, DO  Location: Office  Delivery: Face-to-face     HPI  [name], a 45 y.o. year old female, is here today because of her Chronic pain syndrome [G89.4]. Ms. Harries primary complain today is Other, Generalized Body Aches (fibromyalgia), and Back Pain (Lumbar left ) Last encounter: My last encounter with her was on Visit date not found. Pertinent problems: Ms. Appleby has Chronic pain syndrome; Chronic musculoskeletal pain; Osteoarthrosis; Presence of functional implant ([employer] Lumbar spinal cord stimulator implant); Chronic low back pain (Bilateral) (R>L) w/o sciatica; Chronic lumbar radicular pain (Right L5 dermatome; Left S1 Dermatome) (Bilateral) (R>L); Chronic neck pain (1ry area of Pain) (Bilateral) (L>R); Chronic cervical radicular pain (C7 Dermatome) (Bilateral) (L>R); Chronic lower extremity pain (Bilateral) (R>L); Lumbar spondylosis; Lumbar facet syndrome (Bilateral) (R>L); Neurogenic pain; Fibromyalgia; Pain in right knee; Chronic upper extremity pain (2ry area of Pain) (Bilateral) (L>R); Chronic hip pain (3ry area of Pain) (Bilateral) (R>L); Closed fracture of scapula, sequela (Right); DDD (degenerative disc disease), cervical; Cervicalgia;  Abnormal MRI, shoulder (Right); Spondylosis without myelopathy or radiculopathy, lumbosacral region; DDD (degenerative disc disease), lumbar; Osteoarthritis involving multiple joints; Acute postoperative pain; Chronic low back pain (Right) w/ sciatica (Right); Chronic lower extremity pain (Right); Lumbosacral radiculopathy at L5 (Right); Dropfoot (Right); and Lumbar facet arthropathy on their pertinent problem list. Pain Assessment: Severity of Chronic pain is reported as a 6 /10. Location: Back Lower,Left/into left hip and leg. Onset: More than a month ago. Quality: Discomfort,Constant,Aching,Burning,Sharp,Stabbing. Timing: Constant. Modifying factor(s): nothing currently. Vitals:  vitals were not taken for this visit.   Reason for encounter: worsening of previously known (established) problem. In addition, the patient is scheduled for medication management however, the last time that I wrote for any prescriptions was on 06/21/2019, at which time I wrote for 3 prescriptions of oxycodone IR 5 mg # 120 to be taken 1 tablet p.o. every 6 hours. The last of the 3 prescriptions should have been filled on 09/20/2019 to last until 01/01/2020. According to the PMP it was actually failed on 10/10/2019. However, the patient has also received 2 other, more recent, prescriptions from Rolm Bookbinder, MD. He is a Glass blower/designer. She has received a prescription for oxycodone IR 5 mg number 10 pills on 01/01/2020 and a second prescription for tramadol 50 mg number 10 pills on 01/03/2020. Review of the patient's chart indicates that she had an appendectomy done on 01/01/2020.  The last interventional treatment that we provided the patient was a therapeutic right lumbar facet RFA #2 under fluoroscopic guidance and IV sedation done on 05/13/2019. Prior to that, she had a left-sided lumbar facet RFA #2 done on 04/08/2019.  Today she indicates that the pain has returned on the left side and she would like to have that  radiofrequency ablation repeated.  Today is the 1 year anniversary from the date that that radiofrequency was done.  Today she confirmed that when she has a radiofrequency ablation it allows her to increase her range of motion and level of activity, significantly.  She is very happy with the results that she has been obtaining from those radiofrequencies and she would love to have it repeated.  She also stated that since the last time that she was seen here, she has lost approximately 30 pounds.  This by itself will be beneficial to her back.  RTCB: 07/05/2020  Pharmacotherapy Assessment   Analgesic: Oxycodone IR 5 mg, 1 tab PO q 6 hrs (20 mg/day of oxycodone) MME/day:30 mg/day.   Monitoring: [place] PMP: PDMP reviewed during this encounter.       Pharmacotherapy: No side-effects or adverse reactions reported. Compliance: No problems identified. Effectiveness: Clinically acceptable.  No notes on file  UDS:  Summary  Date Value Ref Range Status  02/13/2018 FINAL  Final    Comment:    ==================================================================== TOXASSURE SELECT 13 (MW) ==================================================================== Test                             Result       Flag       Units Drug Present and Declared for Prescription Verification   Oxycodone                      2035         EXPECTED   ng/mg creat   Oxymorphone                    564          EXPECTED   ng/mg creat   Noroxycodone                   1930         EXPECTED   ng/mg creat   Noroxymorphone                 142          EXPECTED   ng/mg creat    Sources of oxycodone are scheduled prescription medications.    Oxymorphone, noroxycodone, and noroxymorphone are expected    metabolites of oxycodone. Oxymorphone is also available as a    scheduled prescription medication. Drug Absent but Declared for Prescription Verification   Clonazepam                     Not Detected UNEXPECTED ng/mg  creat ==================================================================== Test                      Result    Flag   Units      Ref Range   Creatinine              103              mg/dL      >=20 ==================================================================== Declared Medications:  The flagging and interpretation on this report are based on the  following declared medications.  Unexpected results may arise from  inaccuracies in the declared medications.  **Note: The testing scope of this panel includes these medications:  Clonazepam (Klonopin)  Oxycodone (Roxicodone)  **Note: The testing scope of this panel does not include following  reported medications:  Albuterol  Amitriptyline (Elavil)  Cholecalciferol  Diclofenac  Duloxetine (Cymbalta)  Esomeprazole  Methocarbamol (Robaxin)  Naproxen  Omeprazole (Prilosec)  Prednisone  Pregabalin (Lyrica)  Quetiapine (Seroquel) ==================================================================== For clinical consultation, please call [phone]. ====================================================================      ROS  Constitutional: Denies any fever or chills Gastrointestinal: No reported hemesis, hematochezia, vomiting, or acute GI distress Musculoskeletal: Denies any acute onset joint swelling, redness, loss of ROM, or weakness Neurological: No reported episodes of acute onset apraxia, aphasia, dysarthria, agnosia, amnesia, paralysis, loss of coordination, or loss of consciousness  Medication Review  DULoxetine, Probiotic Product, albuterol, azithromycin, clonazePAM, hyoscyamine, lisinopril-hydrochlorothiazide, methocarbamol, omeprazole, oxyCODONE, phentermine, and rizatriptan  History Review  Allergy: Ms. Mccarver is allergic to gabapentin, celebrex [celecoxib], pentazocine lactate, seroquel [quetiapine], amoxicillin, bactrim [sulfamethoxazole-trimethoprim], doxycycline, erythromycin, hydromorphone hcl, keflex  [cephalexin], latex, lyrica [pregabalin], and nabumetone. Drug: Ms. Dessert  reports no history of drug use. Alcohol:  reports no history of alcohol use. Tobacco:  reports that she has quit smoking. Her smoking use included cigarettes. She quit smokeless tobacco use about 2 years ago. Social: Ms. Wheless  reports that she has quit smoking. Her smoking use included cigarettes. She quit smokeless tobacco use about 2 years ago. She reports that she does not drink alcohol and does not use drugs. Medical:  has a past medical history of Abscess of axillary fold (10/10/2013), Acute bronchitis with asthma with acute exacerbation (01/23/2015), Dehydration (04/19/2019), Elevated lactic acid level (04/19/2019), Encounter for management of implanted device (12/19/2014), Encounter for therapeutic drug level monitoring (12/19/2014), Fibromyalgia, Hyperglycemia (04/19/2019), Hypertension, Implant pain at battery site (right buttocks area) (04/05/2015), KIDNEY STONES (07/02/2007), Long term current use of opiate analgesic (12/19/2014), Lupus ([place]), Morbid obesity ([place]) (12/19/2014), Muscle cramping (12/19/2014), Opiate use (30 MME/Day) (12/19/2014), Osteoarthritis, Pain, Right knee pain (01/10/2015), Sepsis ([place]) (04/19/2019), Sepsis affecting skin (10/10/2013), and Tobacco abuse (04/19/2019). Surgical: Ms. Demps  has a past surgical history that includes spinal cord stimulator; Occipital nerve stimulator insertion; Cholecystectomy (2000); Knee surgery; Shoulder surgery; Tubal ligation (2000); Endometrial ablation; Spinal cord stimulator implant (2008); Knee surgery (Right, 02-2015); Breast surgery; Spinal cord stimulator insertion (N/A, 06/21/2016); Shoulder arthroscopy with distal clavicle resection (Right, 10/29/2018); Subacromial decompression (Right, 10/29/2018); and laparoscopic appendectomy (Right, 01/01/2020). Family: family history includes Asthma in her mother; Depression in her mother; Fibromyalgia in an other family member; Stroke in  an other family member; Thyroid disease in an other family member.  Laboratory Chemistry Profile   Renal Lab Results  Component Value Date   BUN 7 12/31/2019   CREATININE 0.95 12/31/2019   LABCREA 47.20 41/63/8453   BCR NOT APPLICABLE 64/68/0321   GFRAA >60 04/21/2019   GFRNONAA >60 12/31/2019     Hepatic Lab Results  Component Value Date   AST 20 04/20/2019   ALT 19 04/20/2019   ALBUMIN 2.7 (L) 04/20/2019   ALKPHOS 88 04/20/2019   LIPASE 23 04/19/2019     Electrolytes Lab Results  Component Value Date   NA 138 12/31/2019   K 3.0 (L) 12/31/2019   CL 100 12/31/2019   CALCIUM 9.1 12/31/2019   MG 2.0 04/20/2019   PHOS 3.2 04/20/2019     Bone Lab Results  Component Value Date   VD25OH 25 (L) 10/24/2016     Inflammation (CRP: Acute Phase) (ESR: Chronic Phase) Lab Results  Component Value Date   CRP 1.0 (H) 04/05/2015   ESRSEDRATE 35 (H) 04/05/2015   LATICACIDVEN 1.8 04/20/2019       Note: Above Lab results reviewed.  Recent Imaging Review  CT Renal Stone Study CLINICAL DATA:  Bilateral flank pain and nausea  EXAM: CT ABDOMEN AND PELVIS WITHOUT CONTRAST  TECHNIQUE: Multidetector CT  imaging of the abdomen and pelvis was performed following the standard protocol without IV contrast.  COMPARISON:  None.  FINDINGS: Lower chest: The visualized heart size within normal limits. No pericardial fluid/thickening.  No hiatal hernia.  The visualized portions of the lungs are clear.  Hepatobiliary: Although limited due to the lack of intravenous contrast, normal in appearance without gross focal abnormality. The patient is status post cholecystectomy. No biliary ductal dilation.  Pancreas:  Unremarkable.  No surrounding inflammatory changes.  Spleen: Normal in size. Although limited due to the lack of intravenous contrast, normal in appearance.  Adrenals/Urinary Tract: Both adrenal glands appear normal. The kidneys and collecting system appear normal  without evidence of urinary tract calculus or hydronephrosis. Bladder is unremarkable.  Stomach/Bowel: The stomach, small bowel, and colon are normal in appearance. No inflammatory changes or obstructive findings. There is a mildly dilated appendix measuring up to 9 mm with wall thickening with surrounding fat stranding changes. There is scattered small lymph nodes seen within the right lower quadrant. No loculated fluid collections or free air.  Vascular/Lymphatic: There are no enlarged abdominal or pelvic lymph nodes. No significant gross vascular findings are present given the lack of intravenous contrast.  Reproductive: The uterus and adnexa are unremarkable.  Other: No evidence of abdominal wall mass or hernia.  Musculoskeletal: No acute or significant osseous findings.  IMPRESSION: Findings suggestive of acute appendicitis. No surrounding loculated fluid collections or free air.  Electronically Signed   By: Prudencio Pair M.D.   On: 12/31/2019 22:18 Note: Reviewed        Physical Exam  General appearance: Well nourished, well developed, and well hydrated. In no apparent acute distress Mental status: Alert, oriented x 3 (person, place, & time)       Respiratory: No evidence of acute respiratory distress Eyes: PERLA Vitals: There were no vitals taken for this visit. BMI: Estimated body mass index is 38.53 kg/m as calculated from the following:   Height as of 01/01/20: 5' 7"  (1.702 m).   Weight as of 01/01/20: 246 lb (111.6 kg). Ideal: Patient weight not recorded  Assessment   Status Diagnosis  Controlled Controlled Controlled 1. Chronic pain syndrome   2. Chronic neck pain (1ry area of Pain) (Bilateral) (L>R)   3. Chronic upper extremity pain (2ry area of Pain) (Bilateral) (L>R)   4. Chronic low back pain (Bilateral) (R>L) w/o sciatica   5. Lumbar facet syndrome (Bilateral) (R>L)   6. Lumbar facet arthropathy   7. Chronic hip pain (3ry area of Pain) (Bilateral)  (R>L)   8. Chronic lower extremity pain (Right)   9. Chronic lumbar radicular pain (Right L5 dermatome; Left S1 Dermatome) (Bilateral) (R>L)   10. Pharmacologic therapy   11. Uncomplicated opioid dependence ([place])   12. Fibromyalgia   13. Muscle cramping   14. Chronic musculoskeletal pain      Updated Problems: Problem  Chronic upper extremity pain (2ry area of Pain) (Bilateral) (L>R)  Chronic hip pain (3ry area of Pain) (Bilateral) (R>L)  Chronic low back pain (Bilateral) (R>L) w/o sciatica  Chronic neck pain (1ry area of Pain) (Bilateral) (L>R)  Uncomplicated Opioid Dependence ([facility])    Plan of Care  Problem-specific:  No problem-specific Assessment & Plan notes found for this encounter.  Ms. GILLIE FLEITES has a current medication list which includes the following long-term medication(s): clonazepam, hyoscyamine, lisinopril-hydrochlorothiazide, omeprazole, oxycodone, rizatriptan, albuterol, duloxetine, methocarbamol, oxycodone, [START ON 05/06/2020] oxycodone, [START ON 06/05/2020] oxycodone, and phentermine.  Pharmacotherapy (Medications Ordered):  Meds ordered this encounter  Medications  . oxyCODONE (OXY IR/ROXICODONE) 5 MG immediate release tablet    Sig: Take 1 tablet (5 mg total) by mouth every 6 (six) hours as needed for severe pain. Must last 30 days    Dispense:  120 tablet    Refill:  0    Chronic Pain: STOP Act (Not applicable) Fill 1 day early if closed on refill date. Do not fill until: 09/20/2019. To last until: 10/20/2019. Avoid benzodiazepines within 8 hours of opioids  . oxyCODONE (OXY IR/ROXICODONE) 5 MG immediate release tablet    Sig: Take 1 tablet (5 mg total) by mouth every 6 (six) hours as needed for severe pain. Must last 30 days    Dispense:  120 tablet    Refill:  0    Chronic Pain: STOP Act (Not applicable) Fill 1 day early if closed on refill date. Do not fill until: 09/20/2019. To last until: 10/20/2019. Avoid benzodiazepines within 8 hours of opioids  .  oxyCODONE (OXY IR/ROXICODONE) 5 MG immediate release tablet    Sig: Take 1 tablet (5 mg total) by mouth every 6 (six) hours as needed for severe pain. Must last 30 days    Dispense:  120 tablet    Refill:  0    Chronic Pain: STOP Act (Not applicable) Fill 1 day early if closed on refill date. Do not fill until: 09/20/2019. To last until: 10/20/2019. Avoid benzodiazepines within 8 hours of opioids  . methocarbamol (ROBAXIN) 750 MG tablet    Sig: Take 1 tablet (750 mg total) by mouth every 8 (eight) hours as needed for muscle spasms.    Dispense:  90 tablet    Refill:  2    Fill one day early if pharmacy is closed on scheduled refill date. May substitute for generic if available.   Orders:  Orders Placed This Encounter  Procedures  . Radiofrequency,Lumbar    Standing Status:   Future    Standing Expiration Date:   04/06/2021    Scheduling Instructions:     Side(s): Left-sided     Level: L3-4, L4-5, & L5-S1 Facets (L2, L3, L4, L5, & S1 Medial Branch Nerves)     Sedation: With Sedation.     Scheduling Timeframe: As soon as pre-approved    Order Specific Question:   Where will this procedure be performed?    Answer:   [facility]   Follow-up plan:   Return in about 3 months (around 07/05/2020) for (F2F), (Med Mgmt), in addition, RFA (2mn): (L) L-FCT RFA .      Interventional treatment options:  Under consideration:   Diagnostic right suprascapular nerve block #1  Repeat therapeutic bilateral lumbar facet RFA    Therapeutic/palliative (PRN):   Palliative bilateral lumbar facet blocks  Palliative right lumbar facet RFA #3 (last done on 05/13/2019)  Palliative left lumbar facet RFA #3 (last done on 04/08/2019)  Palliative left C7-T1 CESI  PRN SCS programming adjustments (2018 replacement by Dr. PClydell Hakim     Recent Visits No visits were found meeting these conditions. Showing recent visits within past 90 days and meeting all other requirements Today's Visits Date Type  Provider Dept  04/06/20 Office Visit NMilinda Pointer MD [facility]  Showing today's visits and meeting all other requirements Future Appointments Date Type Provider Dept  06/28/20 Appointment NMilinda Pointer MD [facility]  Showing future appointments within next 90 days and meeting all other requirements  I discussed the assessment  and treatment plan with the patient. The patient was provided an opportunity to ask questions and all were answered. The patient agreed with the plan and demonstrated an understanding of the instructions.  Patient advised to call back or seek an in-person evaluation if the symptoms or condition worsens.  Duration of encounter: 33 minutes.  Note by: Gaspar Cola, MD Date: 04/06/2020; Time: 3:46 PM

## 2020-04-06 NOTE — Patient Instructions (Signed)

## 2020-04-13 ENCOUNTER — Telehealth: Payer: Self-pay

## 2020-04-13 NOTE — Telephone Encounter (Signed)
Her insurance denied authorization for the RFA. What do you want to do going forth? She is in pain and needs something done. Please advise.

## 2020-04-25 ENCOUNTER — Other Ambulatory Visit: Payer: Self-pay | Admitting: Pain Medicine

## 2020-04-25 DIAGNOSIS — M5136 Other intervertebral disc degeneration, lumbar region: Secondary | ICD-10-CM

## 2020-04-25 DIAGNOSIS — M5459 Other low back pain: Secondary | ICD-10-CM | POA: Insufficient documentation

## 2020-04-25 DIAGNOSIS — M47817 Spondylosis without myelopathy or radiculopathy, lumbosacral region: Secondary | ICD-10-CM

## 2020-04-25 DIAGNOSIS — M47816 Spondylosis without myelopathy or radiculopathy, lumbar region: Secondary | ICD-10-CM

## 2020-04-25 DIAGNOSIS — G8929 Other chronic pain: Secondary | ICD-10-CM

## 2020-04-25 DIAGNOSIS — M545 Low back pain, unspecified: Secondary | ICD-10-CM

## 2020-04-25 HISTORY — DX: Other low back pain: M54.59

## 2020-04-25 NOTE — Progress Notes (Signed)
Despite the fact that the patient has had prior therapeutic right lumbar facet RFA x2 (last done on 05/13/2019), as well as prior therapeutic left lumbar facet RFA x2 (last done on 04/08/2019), which provided the patient with 12 months of pain relief from her chronic intractable low back pain, and the patient has no known contraindications, her insurance company has decided to deny the radiofrequency ablation on a 04/24/2020 communication.  This goes against direct medical advice and statement of medical necessity by the treating provider.  Obviously, just because the insurance company denies treatment does not mean that the patient's condition has disappeared and therefore we will have to bring the patient back for a palliative bilateral lumbar facet block.

## 2020-05-04 ENCOUNTER — Telehealth: Payer: 59 | Admitting: Family Medicine

## 2020-05-04 ENCOUNTER — Other Ambulatory Visit: Payer: Self-pay | Admitting: Osteopathic Medicine

## 2020-05-04 ENCOUNTER — Telehealth: Payer: Self-pay

## 2020-05-04 ENCOUNTER — Encounter: Payer: Self-pay | Admitting: Family Medicine

## 2020-05-04 DIAGNOSIS — B373 Candidiasis of vulva and vagina: Secondary | ICD-10-CM

## 2020-05-04 DIAGNOSIS — N3010 Interstitial cystitis (chronic) without hematuria: Secondary | ICD-10-CM

## 2020-05-04 DIAGNOSIS — B3731 Acute candidiasis of vulva and vagina: Secondary | ICD-10-CM | POA: Insufficient documentation

## 2020-05-04 HISTORY — DX: Interstitial cystitis (chronic) without hematuria: N30.10

## 2020-05-04 MED ORDER — FLUCONAZOLE 150 MG PO TABS: 150.0000 mg | ORAL_TABLET | Freq: Once | ORAL | 0 refills | Status: AC

## 2020-05-04 MED ORDER — AMITRIPTYLINE HCL 100 MG PO TABS
100.0000 mg | ORAL_TABLET | Freq: Every day | ORAL | 1 refills | Status: DC
Start: 2020-05-04 — End: 2020-10-24

## 2020-05-04 NOTE — Telephone Encounter (Signed)
Pt called requesting a oral and cream antibiotic for a vaginal yeast problem. Per pt, she is leaving out of town tomorrow. Pls contact patient for scheduling. Pt was last seen 05/27/19 Mary Cruz). No antibiotics without a visit.

## 2020-05-04 NOTE — Assessment & Plan Note (Signed)
Reports good control of symptoms with amitriptyline.  Updated rx entered.

## 2020-05-04 NOTE — Telephone Encounter (Signed)
Pt stated that she has a virtual appt. today @ 4:10.  Tvt

## 2020-05-04 NOTE — Assessment & Plan Note (Signed)
Start diflucan 150mg , may repeat after 72 hours if needed.  Instructed to f/u in clinic if not improving.

## 2020-05-04 NOTE — Progress Notes (Signed)
Mary Cruz - 45 y.o. female MRN [id number]  Date of birth: 12/13/1975   This visit type was conducted due to national recommendations for restrictions regarding the COVID-19 Pandemic (e.g. social distancing).  This format is felt to be most appropriate for this patient at this time.  All issues noted in this document were discussed and addressed.  No physical exam was performed (except for noted visual exam findings with Video Visits).  I discussed the limitations of evaluation and management by telemedicine and the availability of in person appointments. The patient expressed understanding and agreed to proceed.  I connected with@ on 05/04/20 at  4:20 PM EDT by a video enabled telemedicine application and verified that I am speaking with the correct person using two identifiers.  Present at visit: Luetta Nutting, DO Emelia Loron   Patient Location: Home [address] [place] [address]   Provider location:   [facility]  Chief Complaint  Patient presents with  . Vaginal Itching    HPI  Mary Cruz is a 45 y.o. female who presents via audio/video conferencing for a telehealth visit today.  She has complaint today of vaginal itching and burning.  Similar to previous yeast infections. She does also have a small amount of vaginal discharge. She denies vaginal bleeding, fever or chills.   She has mild dysuria but reports being off of elavil that she take for IC,  due to being uninsured. She would like to restart this as it worked pretty well for her.     ROS:  A comprehensive ROS was completed and negative except as noted per HPI  Past Medical History:  Diagnosis Date  . Abscess of axillary fold 10/10/2013  . Acute bronchitis with asthma with acute exacerbation 01/23/2015  . Dehydration 04/19/2019  . Elevated lactic acid level 04/19/2019  . Encounter for management of implanted device 12/19/2014  . Encounter for therapeutic drug level monitoring 12/19/2014  . Fibromyalgia    . Hyperglycemia 04/19/2019  . Hypertension   . Implant pain at battery site (right buttocks area) 04/05/2015  . KIDNEY STONES 07/02/2007   Qualifier: History of  By: Bobby Rumpf CMA (AAMA), Patty    . Long term current use of opiate analgesic 12/19/2014  . Lupus ([place])   . Morbid obesity ([place]) 12/19/2014  . Muscle cramping 12/19/2014  . Opiate use (30 MME/Day) 12/19/2014  . Osteoarthritis   . Pain    chronic regional pain syndrome  . Right knee pain 01/10/2015  . Sepsis ([place]) 04/19/2019  . Sepsis affecting skin 10/10/2013  . Tobacco abuse 04/19/2019    Past Surgical History:  Procedure Laterality Date  . BREAST SURGERY    . CHOLECYSTECTOMY  2000  . ENDOMETRIAL ABLATION    . KNEE SURGERY    . KNEE SURGERY Right 02-2015  . LAPAROSCOPIC APPENDECTOMY Right 01/01/2020   Procedure: APPENDECTOMY LAPAROSCOPIC;  Surgeon: Rolm Bookbinder, MD;  Location[facility];  Service: General;  Laterality: Right;  . OCCIPITAL NERVE STIMULATOR INSERTION    . SHOULDER ARTHROSCOPY WITH DISTAL CLAVICLE RESECTION Right 10/29/2018   Procedure: SHOULDER ARTHROSCOPY WITH DISTAL CLAVICLE RESECTION;  Surgeon: Hiram Gash, MD;  Location: [place];  Service: Orthopedics;  Laterality: Right;  . SHOULDER SURGERY    . spinal cord stimulator    . SPINAL CORD STIMULATOR IMPLANT  2008  . SPINAL CORD STIMULATOR INSERTION N/A 06/21/2016   Procedure: LUMBAR SPINAL CORD STIMULATOR INSERTION;  Surgeon: Clydell Hakim, MD;  Location: [place];  Service: Neurosurgery;  Laterality: N/A;  LUMBAR SPINAL CORD STIMULATOR INSERTION  . SUBACROMIAL DECOMPRESSION Right 10/29/2018   Procedure: SUBACROMIAL DECOMPRESSION;  Surgeon: Hiram Gash, MD;  Location: [place];  Service: Orthopedics;  Laterality: Right;  . TUBAL LIGATION  2000    Family History  Problem Relation Age of Onset  . Depression Mother        maternal grandmother  . Asthma Mother   . Fibromyalgia Other        aunt  . Stroke Other         grandmother  . Thyroid disease Other        grandmother    Social History   Socioeconomic History  . Marital status: Married    Spouse name: Not on file  . Number of children: Not on file  . Years of education: Not on file  . Highest education level: Not on file  Occupational History  . Not on file  Tobacco Use  . Smoking status: Former Smoker    Types: Cigarettes  . Smokeless tobacco: Former Systems developer    Quit date: 12/22/2017  Vaping Use  . Vaping Use: Former  Substance and Sexual Activity  . Alcohol use: No    Alcohol/week: 0.0 standard drinks  . Drug use: No  . Sexual activity: Yes    Partners: Male    Birth control/protection: Surgical  Other Topics Concern  . Not on file  Social History Narrative  . Not on file   Social Determinants of Health   Financial Resource Strain: Not on file  Food Insecurity: Not on file  Transportation Needs: Not on file  Physical Activity: Not on file  Stress: Not on file  Social Connections: Not on file  Intimate Partner Violence: Not on file     Current Outpatient Medications:  .  fluconazole (DIFLUCAN) 150 MG tablet, Take 1 tablet (150 mg total) by mouth once for 1 dose. May repeat after 72 hours, Disp: 2 tablet, Rfl: 0 .  amitriptyline (ELAVIL) 100 MG tablet, Take 1 tablet (100 mg total) by mouth at bedtime., Disp: 90 tablet, Rfl: 1 .  clonazePAM (KLONOPIN) 0.5 MG tablet, Take 0.5-1 tablets (0.25-0.5 mg total) by mouth 2 (two) times daily as needed for anxiety., Disp: 30 tablet, Rfl: 0 .  hyoscyamine (LEVSIN SL) 0.125 MG SL tablet, Place under the tongue., Disp: , Rfl:  .  lisinopril-hydrochlorothiazide (ZESTORETIC) 10-12.5 MG tablet, Take 1 tablet by mouth daily., Disp: 90 tablet, Rfl: 3 .  methocarbamol (ROBAXIN) 750 MG tablet, Take 1 tablet (750 mg total) by mouth every 8 (eight) hours as needed for muscle spasms., Disp: 90 tablet, Rfl: 2 .  omeprazole (PRILOSEC) 40 MG capsule, Take 1 capsule (40 mg total) by mouth daily., Disp:  90 capsule, Rfl: 3 .  oxyCODONE (OXY IR/ROXICODONE) 5 MG immediate release tablet, Take 1 tablet (5 mg total) by mouth every 6 (six) hours as needed for severe pain. Must last 30 days, Disp: 120 tablet, Rfl: 0 .  [START ON 05/06/2020] oxyCODONE (OXY IR/ROXICODONE) 5 MG immediate release tablet, Take 1 tablet (5 mg total) by mouth every 6 (six) hours as needed for severe pain. Must last 30 days, Disp: 120 tablet, Rfl: 0 .  [START ON 06/05/2020] oxyCODONE (OXY IR/ROXICODONE) 5 MG immediate release tablet, Take 1 tablet (5 mg total) by mouth every 6 (six) hours as needed for severe pain. Must last 30 days, Disp: 120 tablet, Rfl: 0 .  Probiotic Product (ALIGN PO),  Take by mouth daily., Disp: , Rfl:  .  rizatriptan (MAXALT) 10 MG tablet, Take 1 tablet (10 mg total) by mouth as needed for migraine. May repeat in 2 hours if needed, Disp: 10 tablet, Rfl: 12  EXAM:  VITALS per patient if applicable: BP 960/45   Temp (!) 97.5 F (36.4 C)   Ht 5\' 4"  (1.626 m)   Wt 244 lb (110.7 kg)   BMI 41.88 kg/m   GENERAL: alert, oriented, appears well and in no acute distress  HEENT: atraumatic, conjunttiva clear, no obvious abnormalities on inspection of external nose and ears  NECK: normal movements of the head and neck  LUNGS: on inspection no signs of respiratory distress, breathing rate appears normal, no obvious gross SOB, gasping or wheezing  CV: no obvious cyanosis  MS: moves all visible extremities without noticeable abnormality  PSYCH/NEURO: pleasant and cooperative, no obvious depression or anxiety, speech and thought processing grossly intact  ASSESSMENT AND PLAN:  Discussed the following assessment and plan:  Interstitial cystitis Reports good control of symptoms with amitriptyline.  Updated rx entered.    Vaginal candidiasis Start diflucan 150mg , may repeat after 72 hours if needed.  Instructed to f/u in clinic if not improving.       I discussed the assessment and treatment plan with  the patient. The patient was provided an opportunity to ask questions and all were answered. The patient agreed with the plan and demonstrated an understanding of the instructions.   The patient was advised to call back or seek an in-person evaluation if the symptoms worsen or if the condition fails to improve as anticipated.    Luetta Nutting, DO

## 2020-05-04 NOTE — Progress Notes (Signed)
Symptoms started Tuesday. Itching and burning. Pt states she's been off Elavil and is experiencing urination pain.

## 2020-05-08 ENCOUNTER — Telehealth: Payer: Self-pay | Admitting: Pain Medicine

## 2020-05-08 DIAGNOSIS — S27322A Contusion of lung, bilateral, initial encounter: Secondary | ICD-10-CM | POA: Insufficient documentation

## 2020-05-08 DIAGNOSIS — J939 Pneumothorax, unspecified: Secondary | ICD-10-CM | POA: Insufficient documentation

## 2020-05-08 DIAGNOSIS — S2241XA Multiple fractures of ribs, right side, initial encounter for closed fracture: Secondary | ICD-10-CM | POA: Insufficient documentation

## 2020-05-08 NOTE — Telephone Encounter (Signed)
Patient called stating she is in the hospital. She was in accident with her horse and ended up falling. She has broken ribs and severely bruised hip. She wanted to let us know she is on xtra pain meds. She will call when she gets out and let us know of any meds sent home. She is going to reschedule her procedure when she is able to lay on her stomach.

## 2020-05-10 ENCOUNTER — Telehealth: Payer: Self-pay | Admitting: General Practice

## 2020-05-10 ENCOUNTER — Telehealth: Payer: Self-pay

## 2020-05-10 NOTE — Telephone Encounter (Signed)
She has been in the hospital for an accident being thrown off her horse. She has has 7 broken ribs. The medication they gave her in the hospital was Tylenol, hydrocodone 5-325 quantity of 20. Tramadol 50 mg quantity of 20. She just wanted to let you know.

## 2020-05-10 NOTE — Telephone Encounter (Signed)
Transition Care Management Unsuccessful Follow-up Telephone Call  Date of discharge and from where:  [facility] 05/10/20  Attempts:  1st Attempt  Reason for unsuccessful TCM follow-up call:  No answer/busy

## 2020-05-11 ENCOUNTER — Ambulatory Visit: Payer: 59 | Admitting: Pain Medicine

## 2020-05-12 NOTE — Telephone Encounter (Signed)
Transition Care Management Unsuccessful Follow-up Telephone Call  Date of discharge and from where:  [facility] 05/10/20  Attempts:  2nd Attempt  Reason for unsuccessful TCM follow-up call:  Left voice message

## 2020-05-17 NOTE — Telephone Encounter (Signed)
Transition Care Management Unsuccessful Follow-up Telephone Call  Date of discharge and from where:  [facility] 05/10/20  Attempts:  3rd Attempt  Reason for unsuccessful TCM follow-up call:  Left voice message

## 2020-06-23 ENCOUNTER — Ambulatory Visit: Payer: Self-pay

## 2020-06-24 ENCOUNTER — Emergency Department
Admission: RE | Admit: 2020-06-24 | Discharge: 2020-06-24 | Disposition: A | Discharge status: Home / Self Care | Payer: 59 | Source: Ambulatory Visit

## 2020-06-24 ENCOUNTER — Other Ambulatory Visit: Payer: Self-pay

## 2020-06-24 ENCOUNTER — Emergency Department (INDEPENDENT_AMBULATORY_CARE_PROVIDER_SITE_OTHER): Payer: 59

## 2020-06-24 VITALS — BP 117/78 | HR 96 | Temp 98.0°F | Resp 17

## 2020-06-24 DIAGNOSIS — R058 Other specified cough: Secondary | ICD-10-CM

## 2020-06-24 DIAGNOSIS — R059 Cough, unspecified: Secondary | ICD-10-CM | POA: Diagnosis not present

## 2020-06-24 DIAGNOSIS — R0689 Other abnormalities of breathing: Secondary | ICD-10-CM

## 2020-06-24 DIAGNOSIS — R0789 Other chest pain: Secondary | ICD-10-CM

## 2020-06-24 DIAGNOSIS — Z20822 Contact with and (suspected) exposure to covid-19: Secondary | ICD-10-CM

## 2020-06-24 DIAGNOSIS — J40 Bronchitis, not specified as acute or chronic: Secondary | ICD-10-CM

## 2020-06-24 MED ORDER — BENZONATATE 100 MG PO CAPS: 200.0000 mg | ORAL_CAPSULE | Freq: Three times a day (TID) | ORAL | 0 refills | Status: DC | PRN

## 2020-06-24 MED ORDER — CEFDINIR 300 MG PO CAPS: 300.0000 mg | ORAL_CAPSULE | Freq: Two times a day (BID) | ORAL | 0 refills | Status: AC

## 2020-06-24 MED ORDER — PREDNISONE 20 MG PO TABS: ORAL_TABLET | ORAL | 0 refills | Status: DC

## 2020-06-24 NOTE — ED Triage Notes (Signed)
R rib pain has increased with cough x 1 week  Productive cough w/ green drainage  Nyquil last night  Tylenol 1000mg   @ 0830 Parents tested positive for COVID on Mother's Day  COVID 11/21 No vaccine

## 2020-06-24 NOTE — Discharge Instructions (Addendum)
Advised/encouraged patient please take medication as directed with food to completion.  Increase daily water intake while taking medications.  May use Tessalon Perles daily, as needed as adjunct for cough.  We will follow-up with lab results once returned.

## 2020-06-24 NOTE — ED Provider Notes (Signed)
[facility]    CSN: [id number] Arrival date & time: 06/24/20  1134      History   Chief Complaint Chief Complaint  Patient presents with  . Cough  . Otalgia    HPI Mary Cruz is a 45 y.o. female.   HPI 45 year old female presents with productive cough, chest congestion, and sinus nasal congestion for 1 week.  Patient reports exposure to COVID on 06/18/2020.  Patient is accompanied by her 64-year-old daughter who we also evaluating today.  Denies fever.  Past Medical History:  Diagnosis Date  . Abscess of axillary fold 10/10/2013  . Acute bronchitis with asthma with acute exacerbation 01/23/2015  . Dehydration 04/19/2019  . Elevated lactic acid level 04/19/2019  . Encounter for management of implanted device 12/19/2014  . Encounter for therapeutic drug level monitoring 12/19/2014  . Fibromyalgia   . Hyperglycemia 04/19/2019  . Hypertension   . Implant pain at battery site (right buttocks area) 04/05/2015  . KIDNEY STONES 07/02/2007   Qualifier: History of  By: Bobby Rumpf CMA (AAMA), Patty    . Long term current use of opiate analgesic 12/19/2014  . Lupus ([place])   . Morbid obesity ([place]) 12/19/2014  . Muscle cramping 12/19/2014  . Opiate use (30 MME/Day) 12/19/2014  . Osteoarthritis   . Pain    chronic regional pain syndrome  . Right knee pain 01/10/2015  . Sepsis ([place]) 04/19/2019  . Sepsis affecting skin 10/10/2013  . Tobacco abuse 04/19/2019    Patient Active Problem List   Diagnosis Date Noted  . Interstitial cystitis 05/04/2020  . Vaginal candidiasis 05/04/2020  . Intractable low back pain 04/25/2020  . Appendicitis 01/01/2020  . Chronic low back pain (Right) w/ sciatica (Right) 06/21/2019  . Chronic lower extremity pain (Right) 06/21/2019  . Lumbosacral radiculopathy at L5 (Right) 06/21/2019  . 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 Lumbar facet arthropathy 06/21/2019  . Colitis 04/19/2019  . Osteoarthritis involving multiple joints 04/08/2019  . Latex precautions,  history of latex allergy 04/08/2019  . Spondylosis without myelopathy or radiculopathy, lumbosacral region 02/25/2019  . DDD (degenerative disc disease), lumbar 02/25/2019  . DDD (degenerative disc disease), cervical 11/15/2018  . Cervicalgia 11/15/2018  . Abnormal MRI, shoulder (Right) 11/15/2018  . Chronic upper extremity pain (2ry area of Pain) (Bilateral) (L>R) 08/20/2018  . Chronic hip pain (3ry area of Pain) (Bilateral) (R>L) 08/20/2018  . Closed fracture of scapula, sequela (Right) 08/20/2018  . Pharmacologic therapy 08/20/2018  . Disorder of skeletal system 08/20/2018  . Problems influencing health status 08/20/2018  . Pain in right knee 03/19/2017  . Family history of thyroid disease 10/24/2016  . Neurogenic pain 10/10/2016  . Fibromyalgia 10/10/2016  . Spinal cord stimulator status 05/16/2016  . Battery end of life of spinal cord stimulator 05/16/2016  . Positive ANA (antinuclear antibody) 09/13/2015  . Lumbar spondylosis 06/29/2015  . Lumbar facet syndrome (Bilateral) (R>L) 06/29/2015  . Anxiety and depression 01/20/2015  . Long term prescription opiate use 12/19/2014  . Uncomplicated opioid dependence ([place]) 12/19/2014  . Chronic musculoskeletal pain 12/19/2014  . Osteoarthrosis 12/19/2014  . Vitamin D insufficiency 12/19/2014  . 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 Chronic low back pain (Bilateral) (R>L) w/o sciatica 12/19/2014  . Chronic lumbar radicular pain (Right L5 dermatome; Left S1 Dermatome) (Bilateral) (R>L) 12/19/2014  . Chronic neck pain (1ry area of Pain) (Bilateral) (L>R) 12/19/2014  . Chronic cervical radicular pain (C7 Dermatome) (Bilateral) (L>R) 12/19/2014  . Chronic  lower extremity pain (Bilateral) (R>L) 12/19/2014  . Severe obesity with body mass index (BMI) of 35.0 to 39.9 with comorbidity ([place]) 10/27/2014  . Generalized anxiety disorder 03/16/2014  . Essential hypertension, benign 07/09/2013   . Chronic pain syndrome 07/09/2013  . GERD 07/02/2007  . Irritable bowel syndrome 07/02/2007    Past Surgical History:  Procedure Laterality Date  . BREAST SURGERY    . CHOLECYSTECTOMY  2000  . ENDOMETRIAL ABLATION    . KNEE SURGERY    . KNEE SURGERY Right 02-2015  . LAPAROSCOPIC APPENDECTOMY Right 01/01/2020   Procedure: APPENDECTOMY LAPAROSCOPIC;  Surgeon: Rolm Bookbinder, MD;  Location: [facility];  Service: General;  Laterality: Right;  . OCCIPITAL NERVE STIMULATOR INSERTION    . SHOULDER ARTHROSCOPY WITH DISTAL CLAVICLE RESECTION Right 10/29/2018   Procedure: SHOULDER ARTHROSCOPY WITH DISTAL CLAVICLE RESECTION;  Surgeon: Hiram Gash, MD;  Location: [place];  Service: Orthopedics;  Laterality: Right;  . SHOULDER SURGERY    . spinal cord stimulator    . SPINAL CORD STIMULATOR IMPLANT  2008  . SPINAL CORD STIMULATOR INSERTION N/A 06/21/2016   Procedure: LUMBAR SPINAL CORD STIMULATOR INSERTION;  Surgeon: Clydell Hakim, MD;  Location: [place];  Service: Neurosurgery;  Laterality: N/A;  LUMBAR SPINAL CORD STIMULATOR INSERTION  . SUBACROMIAL DECOMPRESSION Right 10/29/2018   Procedure: SUBACROMIAL DECOMPRESSION;  Surgeon: Hiram Gash, MD;  Location: [place];  Service: Orthopedics;  Laterality: Right;  . TUBAL LIGATION  2000    OB History   No obstetric history on file.      Home Medications    Prior to Admission medications   Medication Sig Start Date End Date Taking? Authorizing Provider  amitriptyline (ELAVIL) 100 MG tablet Take 1 tablet (100 mg total) by mouth at bedtime. 05/04/20  Yes Luetta Nutting, DO  benzonatate (TESSALON) 100 MG capsule Take 2 capsules (200 mg total) by mouth 3 (three) times daily as needed for up to 7 days for cough. 06/24/20 07/01/20 Yes Eliezer Lofts, FNP  cefdinir (OMNICEF) 300 MG capsule Take 1 capsule (300 mg total) by mouth 2 (two) times daily for 7 days. 06/24/20 07/01/20 Yes Eliezer Lofts, FNP  clonazePAM  (KLONOPIN) 0.5 MG tablet Take 0.5-1 tablets (0.25-0.5 mg total) by mouth 2 (two) times daily as needed for anxiety. 04/15/19  Yes Early, Coralee Pesa, NP  methocarbamol (ROBAXIN) 750 MG tablet Take 1 tablet (750 mg total) by mouth every 8 (eight) hours as needed for muscle spasms. 04/06/20 07/05/20 Yes Milinda Pointer, MD  omeprazole (PRILOSEC) 40 MG capsule Take 1 capsule (40 mg total) by mouth daily. 10/24/16  Yes Emeterio Reeve, DO  oxyCODONE (OXY IR/ROXICODONE) 5 MG immediate release tablet Take 1 tablet (5 mg total) by mouth every 6 (six) hours as needed for severe pain. Must last 30 days 06/05/20 07/05/20 Yes Milinda Pointer, MD  predniSONE (DELTASONE) 20 MG tablet Take 3 tabs PO daily x 5 days. 06/24/20  Yes Eliezer Lofts, FNP  hyoscyamine (LEVSIN SL) 0.125 MG SL tablet Place under the tongue. 12/05/19   [provider]  lisinopril-hydrochlorothiazide (ZESTORETIC) 10-12.5 MG tablet Take 1 tablet by mouth daily. 04/28/19   Emeterio Reeve, DO  oxyCODONE (OXY IR/ROXICODONE) 5 MG immediate release tablet Take 1 tablet (5 mg total) by mouth every 6 (six) hours as needed for severe pain. Must last 30 days 04/06/20 05/06/20  Milinda Pointer, MD  oxyCODONE (OXY IR/ROXICODONE) 5 MG immediate release tablet Take 1 tablet (5 mg total) by mouth every  6 (six) hours as needed for severe pain. Must last 30 days 05/06/20 06/05/20  Milinda Pointer, MD  Probiotic Product (ALIGN PO) Take by mouth daily.    [provider]  rizatriptan (MAXALT) 10 MG tablet Take 1 tablet (10 mg total) by mouth as needed for migraine. May repeat in 2 hours if needed 04/15/19   Early, Coralee Pesa, NP    Family History Family History  Problem Relation Age of Onset  . Depression Mother        maternal grandmother  . Asthma Mother   . Fibromyalgia Other        aunt  . Stroke Other        grandmother  . Thyroid disease Other        grandmother    Social History Social History   Tobacco Use  . Smoking status:  Former Smoker    Types: Cigarettes  . Smokeless tobacco: Former Systems developer    Quit date: 12/22/2017  Vaping Use  . Vaping Use: Former  Substance Use Topics  . Alcohol use: No    Alcohol/week: 0.0 standard drinks  . Drug use: No     Allergies   Gabapentin, Celebrex [celecoxib], Pentazocine lactate, Seroquel [quetiapine], Amoxicillin, Bactrim [sulfamethoxazole-trimethoprim], Doxycycline, Erythromycin, Hydromorphone hcl, Keflex [cephalexin], Latex, Lyrica [pregabalin], and Nabumetone   Review of Systems Review of Systems  Constitutional: Negative.   HENT: Positive for congestion.   Eyes: Negative.   Respiratory: Positive for cough.   Cardiovascular: Negative.   Gastrointestinal: Negative.   Genitourinary: Negative.   Musculoskeletal: Negative.   Skin: Negative.   Neurological: Negative.      Physical Exam Triage Vital Signs ED Triage Vitals  Enc Vitals Group     BP 06/24/20 1150 117/78     Pulse Rate 06/24/20 1150 96     Resp 06/24/20 1150 17     Temp 06/24/20 1150 98 F (36.7 C)     Temp Source 06/24/20 1150 Oral     SpO2 06/24/20 1150 96 %     Weight --      Height --      Head Circumference --      Peak Flow --      Pain Score 06/24/20 1151 4     Pain Loc --      Pain Edu? --      Excl. in [place]? --    No data found.  Updated Vital Signs BP 117/78 (BP Location: Left Arm)   Pulse 96   Temp 98 F (36.7 C) (Oral)   Resp 17   SpO2 96%      Physical Exam Vitals and nursing note reviewed.  Constitutional:      General: She is not in acute distress.    Appearance: Normal appearance. She is obese. She is ill-appearing.  HENT:     Head: Normocephalic and atraumatic.     Right Ear: Hearing normal. Tympanic membrane is retracted.     Left Ear: Hearing normal. Tenderness present. Tympanic membrane is injected and retracted.     Nose:     Left Turbinates: Enlarged.     Comments: Turbinates are erythematous bilaterally    Mouth/Throat:     Lips: Pink.     Mouth:  Mucous membranes are moist.     Pharynx: Oropharynx is clear. Uvula midline. No oropharyngeal exudate or posterior oropharyngeal erythema.  Eyes:     Extraocular Movements: Extraocular movements intact.     Conjunctiva/sclera: Conjunctivae normal.  Pupils: Pupils are equal, round, and reactive to light.  Cardiovascular:     Rate and Rhythm: Normal rate and regular rhythm.     Pulses: Normal pulses.     Heart sounds: Normal heart sounds. No murmur heard. No gallop.   Pulmonary:     Effort: Pulmonary effort is normal.     Comments: Inspiratory wheeze noted over left apex and right middle lobe, diffuse scattered rhonchi, fine rales noted at left base, frequent nonproductive cough during exam Musculoskeletal:     Cervical back: Normal range of motion and neck supple.  Lymphadenopathy:     Cervical: No cervical adenopathy.  Neurological:     General: No focal deficit present.     Mental Status: She is alert and oriented to person, place, and time.  Psychiatric:        Mood and Affect: Mood normal.        Behavior: Behavior normal.      UC Treatments / Results  Labs (all labs ordered are listed, but only abnormal results are displayed) Labs Reviewed  COVID-19, FLU A+B NAA    EKG   Radiology DG Chest 2 View  Result Date: 06/24/2020 CLINICAL DATA:  45 year old presenting with a 1 week history of cough that has progressively worsened over the past 3 days. RIGHT-sided chest pain when coughing. EXAM: CHEST - 2 VIEW COMPARISON:  09/20/2019 and earlier. FINDINGS: Cardiomediastinal silhouette unremarkable and unchanged. Prominent bronchovascular markings diffusely and mild central peribronchial thickening, less than on the prior examination. Lungs otherwise clear. No localized airspace consolidation. No pleural effusions. No pneumothorax. Normal pulmonary vascularity. Visualized bony thorax unremarkable. Dorsal cord stimulating device overlies the mid and lower thoracic region.  IMPRESSION: Mild changes of bronchitis and/or asthma without localized airspace pneumonia. Electronically Signed   By: Evangeline Dakin M.D.   On: 06/24/2020 13:21    Procedures Procedures (including critical care time)  Medications Ordered in UC Medications - No data to display  Initial Impression / Assessment and Plan / UC Course  I have reviewed the triage vital signs and the nursing notes.  Pertinent labs & imaging results that were available during my care of the patient were reviewed by me and considered in my medical decision making (see chart for details).    MDM: 1.  Exposure to confirmed COVID-19, 2.  Cough, 3.  Adventitious breath sounds, 4.  Bronchitis.  Patient discharged home, hemodynamically stable.  COVID-19/Flu A&B ordered. Final Clinical Impressions(s) / UC Diagnoses   Final diagnoses:  Cough  Exposure to confirmed case of COVID-19  Adventitious breath sounds  Bronchitis     Discharge Instructions     Advised/encouraged patient please take medication as directed with food to completion.  Increase daily water intake while taking medications.  May use Tessalon Perles daily, as needed as adjunct for cough.  We will follow-up with lab results once returned.    ED Prescriptions    Medication Sig Dispense Auth. Provider   cefdinir (OMNICEF) 300 MG capsule Take 1 capsule (300 mg total) by mouth 2 (two) times daily for 7 days. 14 capsule Eliezer Lofts, FNP   predniSONE (DELTASONE) 20 MG tablet Take 3 tabs PO daily x 5 days. 15 tablet Eliezer Lofts, FNP   benzonatate (TESSALON) 100 MG capsule Take 2 capsules (200 mg total) by mouth 3 (three) times daily as needed for up to 7 days for cough. 30 capsule Eliezer Lofts, FNP     PDMP not reviewed this encounter.   Tishanna Dunford,  [place], [place] 06/24/20 1333

## 2020-06-27 DIAGNOSIS — Z79891 Long term (current) use of opiate analgesic: Secondary | ICD-10-CM | POA: Insufficient documentation

## 2020-06-27 HISTORY — DX: Long term (current) use of opiate analgesic: Z79.891

## 2020-06-27 LAB — COVID-19, FLU A+B NAA
Influenza A, NAA: NOT DETECTED
Influenza B, NAA: NOT DETECTED
SARS-CoV-2, NAA: NOT DETECTED

## 2020-06-27 NOTE — Progress Notes (Addendum)
Patient: Mary Cruz  Service Category: E/M  Provider: Gaspar Cola, MD  DOB: [birth year]  DOS: 06/28/2020  Location: Office  MRN: [id number]  Setting: Ambulatory outpatient  Referring Provider: Emeterio Reeve, DO  Type: Established Patient  Specialty: Interventional Pain Management  PCP: Emeterio Reeve, DO  Location: Remote location  Delivery: TeleHealth     Virtual Encounter - Pain Management PROVIDER NOTE: Information contained herein reflects review and annotations entered in association with encounter. Interpretation of such information and data should be left to medically-trained personnel. Information provided to patient can be located elsewhere in the medical record under "Patient Instructions". Document created using STT-dictation technology, any transcriptional errors that may result from process are unintentional.    Contact & Pharmacy Preferred: [phone] Home: [phone] (home) Mobile: [phone] (mobile) E-mail: [email or web address]  [id number] [place], [place] - [address]Phone: [phone]: [phone] [id number] [place]one: [phone]: [phone] Pre-screening  Mary Cruz "in-person" vs "virtual" encounter. She indicated preferring virtual for this encounter.   Reason COVID-19*  Social distancing based on CDC and AMA recommendations.   I contacted Mary Cruz 06/28/2020 via telephone.      I clearly identified myself as FrGaspar ColaMD. I verified that I was speaking with the correct person using two identifiers (Name: Mary Cruz date of birth: [birth year]  Consent I sought verbal advanced consent from Mary Cruz virtual visit interactions. I informed Mary Cruz possible security and privacy concerns, risks, and  limitations associated with providing "not-in-person" medical evaluation and management services. I also informed Mary Cruz the availability of "in-person" appointments. Finally, I informed her that there would be a charge for the virtual visit and that she could be  personally, fully or partially, financially responsible for it. Mary Cruz understanding and agreed to proceed.   Historic Elements   Mary Cruz a [age].o. year old, female patient evaluated today after our last contact on 05/08/2020. Mary Cruz a past medical history of Abscess of axillary fold (10/10/2013), Acute bronchitis with asthma with acute exacerbation (01/23/2015), Dehydration (04/19/2019), Elevated lactic acid level (04/19/2019), Encounter for management of implanted device (12/19/2014), Encounter for therapeutic drug level monitoring (12/19/2014), Fibromyalgia, Hyperglycemia (04/19/2019), Hypertension, Implant pain at battery site (right buttocks area) (04/05/2015), KIDNEY STONES (07/02/2007), Long term current use of opiate analgesic (12/19/2014), Lupus ([place] Morbid obesity ([place](12/19/2014), Muscle cramping (12/19/2014), Opiate use (30 MME/Day) (12/19/2014), Osteoarthritis, Pain, Right knee pain (01/10/2015), Sepsis ([place](04/19/2019), Sepsis affecting skin (10/10/2013), and Tobacco abuse (04/19/2019). She also  has a past surgical history that includes spinal cord stimulator; Occipital nerve stimulator insertion; Cholecystectomy (2000); Knee surgery; Shoulder surgery; Tubal ligation (2000); Endometrial ablation; Spinal cord stimulator implant (2008); Knee surgery (Right, 02-2015); Breast surgery; Spinal cord stimulator insertion (N/A, 06/21/2016); Shoulder arthroscopy with distal clavicle resection (Right, 10/29/2018); Subacromial decompression (Right, 10/29/2018); and laparoscopic appendectomy (Right, 01/01/2020). Mary Cruz a current medication list which includes the following prescription(s): amitriptyline,  benzonatate, cefdinir, clonazepam, hyoscyamine, lisinopril-hydrochlorothiazide, methocarbamol, omeprazole, [START ON 07/05/2020] oxycodone, probiotic product, and rizatriptan. She  reports that she has quit smoking. Her smoking use included cigarettes. She quit smokeless tobacco use about 2 years ago. She reports that she does not drink alcohol and does not use drugs. Mary Cruz  is allergic to celecoxib, doxycycline, gabapentin, latex, nabumetone, pentazocine lactate, seroquel [quetiapine], amoxicillin, bactrim [sulfamethoxazole-trimethoprim], cephalexin, erythromycin, hydromorphone hcl, and lyrica [pregabalin].   HPI  Today, she is being contacted for medication management.  The patient indicates doing well with the current medication regimen. No adverse reactions or side effects reported to the medications.  The patient indicates having fallen off of a horse and fractured several ribs.  She also has a contusion with a hematoma in the area of her hip.  I have recommended that she use some heat for that since it will help breakdown the hemoglobin molecule faster and it will help the hematoma go down faster as well.  She refers having some numbness over that area.  She had a face-to-face appointment today, but we have switched her to a virtual visit because of this recent fall.  She has a follow-up appointment with [place] in 07/27/2020.  RTCB: 08/04/2020 (07/27/2020) Nonopioids transferred 04/06/2020: Robaxin  Pharmacotherapy Assessment  Analgesic: Oxycodone IR 5 mg, 1 tab PO q 6 hrs (20 mg/day of oxycodone) MME/day:30 mg/day.   Monitoring: [place] PMP: PDMP reviewed during this encounter.       Pharmacotherapy: No side-effects or adverse reactions reported. Compliance: No problems identified. Effectiveness: Clinically acceptable. Plan: Refer to "POC".  UDS:  Summary  Date Value Ref Range Status  02/13/2018 FINAL  Final    Comment:     ==================================================================== TOXASSURE SELECT 13 (MW) ==================================================================== Test                             Result       Flag       Units Drug Present and Declared for Prescription Verification   Oxycodone                      2035         EXPECTED   ng/mg creat   Oxymorphone                    564          EXPECTED   ng/mg creat   Noroxycodone                   1930         EXPECTED   ng/mg creat   Noroxymorphone                 142          EXPECTED   ng/mg creat    Sources of oxycodone are scheduled prescription medications.    Oxymorphone, noroxycodone, and noroxymorphone are expected    metabolites of oxycodone. Oxymorphone is also available as a    scheduled prescription medication. Drug Absent but Declared for Prescription Verification   Clonazepam                     Not Detected UNEXPECTED ng/mg creat ==================================================================== Test                      Result    Flag   Units      Ref Range   Creatinine              103              mg/dL      >=20 ==================================================================== Declared Medications:  The flagging and interpretation on this report  are based on the  following declared medications.  Unexpected results may arise from  inaccuracies in the declared medications.  **Note: The testing scope of this panel includes these medications:  Clonazepam (Klonopin)  Oxycodone (Roxicodone)  **Note: The testing scope of this panel does not include following  reported medications:  Albuterol  Amitriptyline (Elavil)  Cholecalciferol  Diclofenac  Duloxetine (Cymbalta)  Esomeprazole  Methocarbamol (Robaxin)  Naproxen  Omeprazole (Prilosec)  Prednisone  Pregabalin (Lyrica)  Quetiapine (Seroquel) ==================================================================== For clinical consultation, please call [phone]. ====================================================================     Laboratory Chemistry Profile   Renal Lab Results  Component Value Date   BUN 7 12/31/2019   CREATININE 0.95 12/31/2019   LABCREA 47.20 56/38/9373   BCR NOT APPLICABLE 42/87/6811   GFRAA >60 04/21/2019   GFRNONAA >60 12/31/2019     Hepatic Lab Results  Component Value Date   AST 20 04/20/2019   ALT 19 04/20/2019   ALBUMIN 2.7 (L) 04/20/2019   ALKPHOS 88 04/20/2019   LIPASE 23 04/19/2019     Electrolytes Lab Results  Component Value Date   NA 138 12/31/2019   K 3.0 (L) 12/31/2019   CL 100 12/31/2019   CALCIUM 9.1 12/31/2019   MG 2.0 04/20/2019   PHOS 3.2 04/20/2019     Bone Lab Results  Component Value Date   VD25OH 25 (L) 10/24/2016     Inflammation (CRP: Acute Phase) (ESR: Chronic Phase) Lab Results  Component Value Date   CRP 1.0 (H) 04/05/2015   ESRSEDRATE 35 (H) 04/05/2015   LATICACIDVEN 1.8 04/20/2019       Note: Above Lab results reviewed.  Imaging  DG Chest 2 View CLINICAL DATA:  45 year old presenting with a 1 week history of cough that has progressively worsened over the past 3 days. RIGHT-sided chest pain when coughing.  EXAM: CHEST - 2 VIEW  COMPARISON:  09/20/2019 and earlier.  FINDINGS: Cardiomediastinal silhouette unremarkable and unchanged. Prominent bronchovascular markings diffusely and mild central peribronchial thickening, less than on the prior examination. Lungs otherwise clear. No localized airspace consolidation. No pleural effusions. No pneumothorax. Normal pulmonary vascularity. Visualized bony thorax unremarkable. Dorsal cord stimulating device overlies the mid and lower thoracic region.  IMPRESSION: Mild changes of bronchitis and/or asthma without localized airspace pneumonia.  Electronically Signed   By: Evangeline Dakin M.D.   On: 06/24/2020 13:21  Assessment  The primary encounter diagnosis was Chronic pain syndrome.  Diagnoses of Chronic neck pain (1ry area of Pain) (Bilateral) (L>R), Chronic upper extremity pain (2ry area of Pain) (Bilateral) (L>R), Chronic hip pain (3ry area of Pain) (Bilateral) (R>L), Chronic cervical radicular pain (C7 Dermatome) (Bilateral) (L>R), Chronic low back pain (Bilateral) (R>L) w/o sciatica, Closed fracture of multiple ribs of right side, sequela, Fall from horse, sequela, Pharmacologic therapy, Chronic use of opiate for therapeutic purpose, and Uncomplicated opioid dependence ([place]) were also pertinent to this visit.  Plan of Care  Problem-specific:  No problem-specific Assessment & Plan notes found for this encounter.  Ms. SHERELL CHRISTOFFEL has a current medication list which includes the following long-term medication(s): amitriptyline, clonazepam, hyoscyamine, lisinopril-hydrochlorothiazide, methocarbamol, omeprazole, [START ON 07/05/2020] oxycodone, and rizatriptan.  Pharmacotherapy (Medications Ordered): Meds ordered this encounter  Medications  . oxyCODONE (OXY IR/ROXICODONE) 5 MG immediate release tablet    Sig: Take 1 tablet (5 mg total) by mouth every 6 (six) hours as needed for severe pain. Must last 30 days    Dispense:  120 tablet    Refill:  0  Not a duplicate. Do NOT delete! Dispense 1 day early if closed on refill date. Avoid benzodiazepines within 8 hours of opioids. Do not send refill requests.   Orders:  No orders of the defined types were placed in this encounter.  Follow-up plan:   Return for scheduled encounter (07/24/2020) (MM).     Interventional treatment options:  Under consideration:   Diagnostic right suprascapular nerve block #1  Repeat therapeutic bilateral lumbar facet RFA    Therapeutic/palliative (PRN):   Palliative bilateral lumbar facet blocks  Palliative right lumbar facet RFA #3 (05/13/2019)  Palliative left lumbar facet RFA #3 (04/08/2019)  Palliative left C7-T1 CESI  PRN SCS programming adjustments (2018 replacement by Dr. Clydell Hakim)    Recent Visits Date Type Provider Dept  04/06/20 Office Visit Milinda Pointer, MD [facility]  Showing recent visits within past 90 days and meeting all other requirements Today's Visits Date Type Provider Dept  06/28/20 Telemedicine Milinda Pointer, MD [facility]  Showing today's visits and meeting all other requirements Future Appointments Date Type Provider Dept  07/24/20 Appointment Milinda Pointer, MD [facility]  Showing future appointments within next 90 days and meeting all other requirements  I discussed the assessment and treatment plan with the patient. The patient was provided an opportunity to ask questions and all were answered. The patient agreed with the plan and demonstrated an understanding of the instructions.  Patient advised to call back or seek an in-person evaluation if the symptoms or condition worsens.  Duration of encounter: 15 minutes.  Note by: Gaspar Cola, MD Date: 06/28/2020; Time: 4:11 PM

## 2020-06-28 ENCOUNTER — Ambulatory Visit: Payer: 59 | Attending: Pain Medicine | Admitting: Pain Medicine

## 2020-06-28 ENCOUNTER — Other Ambulatory Visit: Payer: Self-pay

## 2020-06-28 DIAGNOSIS — M542 Cervicalgia: Secondary | ICD-10-CM

## 2020-06-28 DIAGNOSIS — M25551 Pain in right hip: Secondary | ICD-10-CM | POA: Diagnosis not present

## 2020-06-28 DIAGNOSIS — Z79899 Other long term (current) drug therapy: Secondary | ICD-10-CM

## 2020-06-28 DIAGNOSIS — G894 Chronic pain syndrome: Secondary | ICD-10-CM | POA: Diagnosis not present

## 2020-06-28 DIAGNOSIS — M545 Low back pain, unspecified: Secondary | ICD-10-CM

## 2020-06-28 DIAGNOSIS — M79602 Pain in left arm: Secondary | ICD-10-CM

## 2020-06-28 DIAGNOSIS — M25552 Pain in left hip: Secondary | ICD-10-CM

## 2020-06-28 DIAGNOSIS — M79601 Pain in right arm: Secondary | ICD-10-CM | POA: Diagnosis not present

## 2020-06-28 DIAGNOSIS — S2241XS Multiple fractures of ribs, right side, sequela: Secondary | ICD-10-CM

## 2020-06-28 DIAGNOSIS — F1120 Opioid dependence, uncomplicated: Secondary | ICD-10-CM

## 2020-06-28 DIAGNOSIS — Z79891 Long term (current) use of opiate analgesic: Secondary | ICD-10-CM

## 2020-06-28 DIAGNOSIS — M5412 Radiculopathy, cervical region: Secondary | ICD-10-CM

## 2020-06-28 DIAGNOSIS — G8929 Other chronic pain: Secondary | ICD-10-CM

## 2020-06-28 MED ORDER — OXYCODONE HCL 5 MG PO TABS: 5.0000 mg | ORAL_TABLET | Freq: Four times a day (QID) | ORAL | 0 refills | Status: DC | PRN

## 2020-06-29 ENCOUNTER — Ambulatory Visit (INDEPENDENT_AMBULATORY_CARE_PROVIDER_SITE_OTHER): Payer: 59

## 2020-06-29 ENCOUNTER — Telehealth: Payer: Self-pay

## 2020-06-29 ENCOUNTER — Ambulatory Visit: Payer: 59 | Admitting: Medical-Surgical

## 2020-06-29 ENCOUNTER — Encounter: Payer: Self-pay | Admitting: Medical-Surgical

## 2020-06-29 ENCOUNTER — Other Ambulatory Visit: Payer: Self-pay

## 2020-06-29 VITALS — BP 148/88 | HR 94 | Temp 98.2°F | Ht 64.0 in | Wt 256.0 lb

## 2020-06-29 DIAGNOSIS — R252 Cramp and spasm: Secondary | ICD-10-CM

## 2020-06-29 DIAGNOSIS — M25551 Pain in right hip: Secondary | ICD-10-CM

## 2020-06-29 DIAGNOSIS — G43009 Migraine without aura, not intractable, without status migrainosus: Secondary | ICD-10-CM

## 2020-06-29 DIAGNOSIS — I10 Essential (primary) hypertension: Secondary | ICD-10-CM

## 2020-06-29 DIAGNOSIS — R1012 Left upper quadrant pain: Secondary | ICD-10-CM

## 2020-06-29 DIAGNOSIS — B3731 Acute candidiasis of vulva and vagina: Secondary | ICD-10-CM

## 2020-06-29 DIAGNOSIS — M797 Fibromyalgia: Secondary | ICD-10-CM

## 2020-06-29 DIAGNOSIS — M7918 Myalgia, other site: Secondary | ICD-10-CM

## 2020-06-29 DIAGNOSIS — G8929 Other chronic pain: Secondary | ICD-10-CM

## 2020-06-29 DIAGNOSIS — M545 Low back pain, unspecified: Secondary | ICD-10-CM | POA: Diagnosis not present

## 2020-06-29 DIAGNOSIS — B373 Candidiasis of vulva and vagina: Secondary | ICD-10-CM

## 2020-06-29 MED ORDER — HYOSCYAMINE SULFATE 0.125 MG SL SUBL: 0.1250 mg | SUBLINGUAL_TABLET | Freq: Every day | SUBLINGUAL | 0 refills | Status: DC | PRN

## 2020-06-29 MED ORDER — OMEPRAZOLE 40 MG PO CPDR: 40.0000 mg | DELAYED_RELEASE_CAPSULE | Freq: Every day | ORAL | 3 refills | Status: DC

## 2020-06-29 MED ORDER — LISINOPRIL-HYDROCHLOROTHIAZIDE 10-12.5 MG PO TABS: 1.0000 | ORAL_TABLET | Freq: Every day | ORAL | 3 refills | Status: DC

## 2020-06-29 MED ORDER — FLUCONAZOLE 150 MG PO TABS: 150.0000 mg | ORAL_TABLET | Freq: Once | ORAL | 0 refills | Status: AC

## 2020-06-29 MED ORDER — METHOCARBAMOL 750 MG PO TABS: 750.0000 mg | ORAL_TABLET | Freq: Three times a day (TID) | ORAL | 2 refills | Status: DC | PRN

## 2020-06-29 MED ORDER — RIZATRIPTAN BENZOATE 10 MG PO TABS: 10.0000 mg | ORAL_TABLET | ORAL | 12 refills | Status: DC | PRN

## 2020-06-29 MED ORDER — BENZONATATE 100 MG PO CAPS: 200.0000 mg | ORAL_CAPSULE | Freq: Three times a day (TID) | ORAL | 0 refills | Status: AC | PRN

## 2020-06-29 NOTE — Progress Notes (Signed)
Subjective:    CC: Possible hematoma  HPI: Pleasant 45 year old female presenting today for evaluation of a possible hematoma.  She had an accident in March when she was thrown from a horse.  She was hospitalized for several days with 7 fractured ribs, a pneumothorax, and a large hematoma to her right hip.  Her bruising has resolved in the hip but the area on the right hip buttock and groin are numb.  If she stands for too long, she has pain in the leg.  Feels that her right leg is weaker than usual.  Has not had any issues with incontinence.  No falls due to weakness.  She does have a history of low back pain with osteoarthritis.  She saw her chronic pain management provider who told her that the numbness was from the hematoma and she should use heat.  She has been doing that but has not seen any relief in her symptoms.  Hypertension-usually takes lisinopril with hydrochlorothiazide as prescribed but she has been out of this for approximately 10 days.  She like to have this refilled today.  Chronic pain-letter is on file that her pain management provider will no longer manage Robaxin.  She would like to have a refill of this sent to the pharmacy.  She is currently on antibiotics for an upper respiratory infection and would like to have a dose of Diflucan sent to the pharmacy.  She does still have a bit of a cough and has run out of the [employer] so would like a refill of this today.  Takes hyoscyamine for abdominal cramping as needed.  She is requesting a refill of this.  Also takes omeprazole and would like to have this sent to the pharmacy since it is cheaper than buying it over-the-counter.  Has a history of migraines and uses rizatriptan as needed as an abortive medication.  Also taking amitriptyline 100 mg nightly.  She would like a refill of her rizatriptan.  I reviewed the past medical history, family history, social history, surgical history, and allergies today and no changes were  needed.  Please see the problem list section below in epic for further details.  Past Medical History: Past Medical History:  Diagnosis Date  . Abscess of axillary fold 10/10/2013  . Acute bronchitis with asthma with acute exacerbation 01/23/2015  . Dehydration 04/19/2019  . Elevated lactic acid level 04/19/2019  . Encounter for management of implanted device 12/19/2014  . Encounter for therapeutic drug level monitoring 12/19/2014  . Fibromyalgia   . Hyperglycemia 04/19/2019  . Hypertension   . Implant pain at battery site (right buttocks area) 04/05/2015  . KIDNEY STONES 07/02/2007   Qualifier: History of  By: Bobby Rumpf CMA (AAMA), [place]    . Long term current use of opiate analgesic 12/19/2014  . Lupus ([place])   . Morbid obesity ([place]) 12/19/2014  . Muscle cramping 12/19/2014  . Opiate use (30 MME/Day) 12/19/2014  . Osteoarthritis   . Pain    chronic regional pain syndrome  . Right knee pain 01/10/2015  . Sepsis ([place]) 04/19/2019  . Sepsis affecting skin 10/10/2013  . Tobacco abuse 04/19/2019   Past Surgical History: Past Surgical History:  Procedure Laterality Date  . BREAST SURGERY    . CHOLECYSTECTOMY  2000  . ENDOMETRIAL ABLATION    . KNEE SURGERY    . KNEE SURGERY Right 02-2015  . LAPAROSCOPIC APPENDECTOMY Right 01/01/2020   Procedure: APPENDECTOMY LAPAROSCOPIC;  Surgeon: Rolm Bookbinder, MD;  Location: [facility];  Service: General;  Laterality: Right;  . OCCIPITAL NERVE STIMULATOR INSERTION    . SHOULDER ARTHROSCOPY WITH DISTAL CLAVICLE RESECTION Right 10/29/2018   Procedure: SHOULDER ARTHROSCOPY WITH DISTAL CLAVICLE RESECTION;  Surgeon: Hiram Gash, MD;  Location: [facility];  Service: Orthopedics;  Laterality: Right;  . SHOULDER SURGERY    . spinal cord stimulator    . SPINAL CORD STIMULATOR IMPLANT  2008  . SPINAL CORD STIMULATOR INSERTION N/A 06/21/2016   Procedure: LUMBAR SPINAL CORD STIMULATOR INSERTION;  Surgeon: Clydell Hakim, MD;  Location: [facility];  Service:  Neurosurgery;  Laterality: N/A;  LUMBAR SPINAL CORD STIMULATOR INSERTION  . SUBACROMIAL DECOMPRESSION Right 10/29/2018   Procedure: SUBACROMIAL DECOMPRESSION;  Surgeon: Hiram Gash, MD;  Location: [facility];  Service: Orthopedics;  Laterality: Right;  . TUBAL LIGATION  2000   Social History: Social History   Socioeconomic History  . Marital status: Married    Spouse name: Not on file  . Number of children: Not on file  . Years of education: Not on file  . Highest education level: Not on file  Occupational History  . Not on file  Tobacco Use  . Smoking status: Former Smoker    Types: Cigarettes  . Smokeless tobacco: Former Systems developer    Quit date: 12/22/2017  Vaping Use  . Vaping Use: Former  Substance and Sexual Activity  . Alcohol use: No    Alcohol/week: 0.0 standard drinks  . Drug use: No  . Sexual activity: Yes    Partners: Male    Birth control/protection: Surgical  Other Topics Concern  . Not on file  Social History Narrative  . Not on file   Social Determinants of Health   Financial Resource Strain: Not on file  Food Insecurity: Not on file  Transportation Needs: Not on file  Physical Activity: Not on file  Stress: Not on file  Social Connections: Not on file   Family History: Family History  Problem Relation Age of Onset  . Depression Mother        maternal grandmother  . Asthma Mother   . Fibromyalgia Other        aunt  . Stroke Other        grandmother  . Thyroid disease Other        grandmother   Allergies: Allergies  Allergen Reactions  . Celecoxib Swelling    "Body swelling"  . Doxycycline Other (See Comments) and Nausea And Vomiting    "SICKNESS"  . Gabapentin Swelling    Whole body swelling  . Latex Itching and Swelling  . Nabumetone Itching  . Pentazocine Lactate Itching and Other (See Comments)    HALLUCINATIONS  . Seroquel [Quetiapine] Other (See Comments)    Falls  . Amoxicillin Diarrhea and Nausea And Vomiting  .  Bactrim [Sulfamethoxazole-Trimethoprim] Other (See Comments)    "Makes very sick"  . Cephalexin Other (See Comments) and Hives    Nausea and causes uti's  . Erythromycin Other (See Comments)    RESULTANT UTI  . Hydromorphone Hcl Other (See Comments)    HALLUCINATIONS  . Lyrica [Pregabalin] Other (See Comments)    "Too sleepy"   Medications: See med rec.  Review of Systems: See HPI for pertinent positives and negatives.   Objective:    General: Well Developed, well nourished, and in no acute distress.  Neuro: Alert and oriented x3.  HEENT: Normocephalic, atraumatic.  Skin: Warm and dry. Cardiac: Regular rate and rhythm, no murmurs rubs  or gallops, no lower extremity edema.  Respiratory: Clear to auscultation bilaterally. Not using accessory muscles, speaking in full sentences. Right hip: Full range of motion to the affected joint.  Altered sensation of the right hip extending into the buttock as well as the groin.  No swelling or bruising noted.  Impression and Recommendations:    1. Right hip pain Suspect her right hip symptoms are related to a lumbar spinal issue.  Known osteoarthritis of the lumbar spine but it is possible that her fall from the horse has caused a separate issue.  We will go ahead and get Lumbar spine x-rays today.  Referring to physical therapy for 4 to 6 weeks.  If no improvement, advised patient to return for further evaluation. - DG Lumbar Spine Complete; Future  2. Migraine without aura and without status migrainosus, not intractable Refilling rizatriptan. - rizatriptan (MAXALT) 10 MG tablet; Take 1 tablet (10 mg total) by mouth as needed for migraine. May repeat in 2 hours if needed  Dispense: 10 tablet; Refill: 12  3. Essential hypertension, benign Blood pressure elevated today but she has been off of this medication for over a week.  Refilling lisinopril-HCTZ. - lisinopril-hydrochlorothiazide (ZESTORETIC) 10-12.5 MG tablet; Take 1 tablet by mouth daily.   Dispense: 90 tablet; Refill: 3  4. Fibromyalgia 5. Muscle cramping 6. Chronic musculoskeletal pain Since pain management will no longer be managing this, sending in refills of Robaxin. - methocarbamol (ROBAXIN) 750 MG tablet; Take 1 tablet (750 mg total) by mouth every 8 (eight) hours as needed for muscle spasms.  Dispense: 90 tablet; Refill: 2  7. Left upper quadrant abdominal pain Refilling hyoscyamine 0.125 mg daily as needed.  8. Vaginal candidiasis Diflucan 150 mg x 1 sent to pharmacy.  Return for evaluation of right hip/leg pain/numbness in 4-6 weeks. ___________________________________________ Clearnce Sorrel, DNP, APRN, FNP-BC [facility] [place]

## 2020-06-29 NOTE — Telephone Encounter (Signed)
Up to [place] I don't think a reason to switch - per my idscusison w/ Joy today she is not comfortable with the controlled substances patient is taking and frankly if patients "cant see me in person" they are not scheduling their appointments appropriately

## 2020-06-29 NOTE — Telephone Encounter (Signed)
Pt is requesting to change PCP's within the practice:   Current PCP: Dr. Sheppard Coil  Requested PCP: Samuel Bouche, FNP  Reason: pt states she is never able to see Dr. Sheppard Coil in person and that their personalities "don't vibe"   Please advise regarding approval/denial.

## 2020-06-30 NOTE — Telephone Encounter (Signed)
Pt aware and states the only med she would need from [facility] would be the methocarbamol. I spoke with Joy and she said she was fine with prescribing the methocarbamol for the pt. Pt aware and is agreeable to plan. Call transferred to the front desk to schedule the [place] in 2 weeks to recheck her BP.   PCP has been changed in pt's chart.

## 2020-06-30 NOTE — Telephone Encounter (Signed)
I will okay the switch as long as the patient is aware that since she is treated by pain management with opioid medications, I will not be prescribing any controlled substances. If she is agreeable to that and wants to switch, she will need to return for a nurse visit for BP in 2 weeks so we can make sure her medications are still working well for her.

## 2020-07-06 ENCOUNTER — Ambulatory Visit: Payer: 59 | Admitting: Physical Therapy

## 2020-07-07 ENCOUNTER — Telehealth: Payer: Self-pay | Admitting: Neurology

## 2020-07-07 MED ORDER — FLUCONAZOLE 150 MG PO TABS: 150.0000 mg | ORAL_TABLET | Freq: Once | ORAL | 0 refills | Status: AC

## 2020-07-07 NOTE — Telephone Encounter (Signed)
LMOM letting patient know and to call back with if this doesn't clear problem.

## 2020-07-07 NOTE — Telephone Encounter (Signed)
Patient left vm stating after taking antibiotics this week and one dose of diflucan she is still having issues with her yeast infection. She [facility] [place]. Please advise.

## 2020-07-07 NOTE — Telephone Encounter (Signed)
I have sent 1 more dose of the Diflucan to the pharmacy. If her symptoms are not responding to that, we will need to bring her in for further evaluation. It could be a resistant form of yeast, BV, or a different cause altogether.   Clearnce Sorrel, DNP, APRN, FNP-BC [facility]

## 2020-07-13 ENCOUNTER — Other Ambulatory Visit: Payer: Self-pay

## 2020-07-13 ENCOUNTER — Ambulatory Visit (INDEPENDENT_AMBULATORY_CARE_PROVIDER_SITE_OTHER): Payer: 59 | Admitting: Rehabilitative and Restorative Service Providers"

## 2020-07-13 ENCOUNTER — Ambulatory Visit: Payer: 59

## 2020-07-13 DIAGNOSIS — M6281 Muscle weakness (generalized): Secondary | ICD-10-CM

## 2020-07-13 DIAGNOSIS — M25551 Pain in right hip: Secondary | ICD-10-CM

## 2020-07-13 DIAGNOSIS — R29898 Other symptoms and signs involving the musculoskeletal system: Secondary | ICD-10-CM

## 2020-07-13 NOTE — Patient Instructions (Signed)
Access Code: [email or web address] URL: [email or web address] Date: 07/13/2020 Prepared by: Rudell Cobb  Exercises Supine Butterfly Groin Stretch - 2 x daily - 7 x weekly - 1 sets - 3 reps - 30 seconds to 1 minute hold Beginner Bridge - 2 x daily - 7 x weekly - 1 sets - 5 reps Beginner Clam - 2 x daily - 7 x weekly - 1 sets - 5 reps Seated Hamstring Stretch with Chair - 2 x daily - 7 x weekly - 1 sets - 3 reps - 30 seconds hold Roller Massage Elongated IT Band Release - 2 x daily - 7 x weekly - 1 sets - 1 reps Standing Glute Med Mobilization with Small Ball on Wall - 2 x daily - 7 x weekly - 1 sets - 1 reps

## 2020-07-13 NOTE — Therapy (Signed)
[facility] [place] [place], [place], [address] Phone: [phone]   Fax:  [phone]  Physical Therapy Evaluation  Patient Details  Name: Mary Cruz MRN: [id number] Date of Birth: 15-Dec-1975 Referring Provider (PT): Samuel Bouche, MD   Encounter Date: 07/13/2020   PT End of Session - 07/13/20 1241    Visit Number 1    Number of Visits 12    Date for PT Re-Evaluation 08/24/20    Authorization Type UHC    PT Start Time 1150    PT Stop Time 4259    PT Time Calculation (min) 44 min           Past Medical History:  Diagnosis Date  . Abscess of axillary fold 10/10/2013  . Acute bronchitis with asthma with acute exacerbation 01/23/2015  . Dehydration 04/19/2019  . Elevated lactic acid level 04/19/2019  . Encounter for management of implanted device 12/19/2014  . Encounter for therapeutic drug level monitoring 12/19/2014  . Fibromyalgia   . Hyperglycemia 04/19/2019  . Hypertension   . Implant pain at battery site (right buttocks area) 04/05/2015  . KIDNEY STONES 07/02/2007   Qualifier: History of  By: Bobby Rumpf CMA (AAMA), Patty    . Long term current use of opiate analgesic 12/19/2014  . Lupus ([place])   . Morbid obesity ([place]) 12/19/2014  . Muscle cramping 12/19/2014  . Opiate use (30 MME/Day) 12/19/2014  . Osteoarthritis   . Pain    chronic regional pain syndrome  . Right knee pain 01/10/2015  . Sepsis ([place]) 04/19/2019  . Sepsis affecting skin 10/10/2013  . Tobacco abuse 04/19/2019    Past Surgical History:  Procedure Laterality Date  . BREAST SURGERY    . CHOLECYSTECTOMY  2000  . ENDOMETRIAL ABLATION    . KNEE SURGERY    . KNEE SURGERY Right 02-2015  . LAPAROSCOPIC APPENDECTOMY Right 01/01/2020   Procedure: APPENDECTOMY LAPAROSCOPIC;  Surgeon: Rolm Bookbinder, MD;  Location: [facility];  Service: General;  Laterality: Right;  . OCCIPITAL NERVE STIMULATOR INSERTION    . SHOULDER ARTHROSCOPY WITH DISTAL CLAVICLE  RESECTION Right 10/29/2018   Procedure: SHOULDER ARTHROSCOPY WITH DISTAL CLAVICLE RESECTION;  Surgeon: Hiram Gash, MD;  Location: [facility];  Service: Orthopedics;  Laterality: Right;  . SHOULDER SURGERY    . spinal cord stimulator    . SPINAL CORD STIMULATOR IMPLANT  2008  . SPINAL CORD STIMULATOR INSERTION N/A 06/21/2016   Procedure: LUMBAR SPINAL CORD STIMULATOR INSERTION;  Surgeon: Clydell Hakim, MD;  Location: [facility];  Service: Neurosurgery;  Laterality: N/A;  LUMBAR SPINAL CORD STIMULATOR INSERTION  . SUBACROMIAL DECOMPRESSION Right 10/29/2018   Procedure: SUBACROMIAL DECOMPRESSION;  Surgeon: Hiram Gash, MD;  Location: [facility];  Service: Orthopedics;  Laterality: Right;  . TUBAL LIGATION  2000    There were no vitals filed for this visit.    Subjective Assessment - 07/13/20 1154    Subjective The patient is s/p a fall from a horse on 05/06/20.  She fractured multiple ribs and had a hematoma on R hip.  She continues to have soreness in the R ribs and she gets a deep internal pain and has numbness in the groin.   Pain is worse in groin area; she also has pain in R SI and posterior hip.  Patient gets some burning sensation inthe R lateral thigh.    Pertinent History spinal DDD, has pain mgmt doctor for back and neck;  HTN  Patient Stated Goals Be able to walk without pain    Currently in Pain? Yes    Pain Score 5     Pain Location Hip    Pain Orientation Right    Pain Descriptors / Indicators Numbness;Aching;Sore;Discomfort    Pain Radiating Towards can radiate into top of foot    Pain Onset More than a month ago    Pain Frequency Constant    Aggravating Factors  standing, walking, bending, chores    Pain Relieving Factors sit and rest; pain meds help    Effect of Pain on Daily Activities "locks up" at times during walking              [facility] PT Assessment - 07/13/20 1159      Assessment   Medical Diagnosis R hip pain    Referring  Provider (PT) Samuel Bouche, MD    Onset Date/Surgical Date 05/06/20    Hand Dominance Right    Next MD Visit 07/24/20    Prior Therapy has tried PT in past ofr h/o chronic pain      Precautions   Precautions None      Balance Screen   Has the patient fallen in the past 6 months No    Has the patient had a decrease in activity level because of a fear of falling?  No    Is the patient reluctant to leave their home because of a fear of falling?  No      Home Ecologist residence    Living Arrangements Spouse/significant other      Observation/Other Assessments   Focus on Therapeutic Outcomes (FOTO)  29%      Sensation   Light Touch Impaired Detail    Light Touch Impaired Details Impaired RLE    Additional Comments numbness in R groin and lateral hip      ROM / Strength   AROM / PROM / Strength AROM;Strength      AROM   Overall AROM Comments 50% limited with pain R si into R lateral hip      Strength   Overall Strength Deficits    Strength Assessment Site Hip;Knee;Ankle    Right/Left Hip Right;Left    Right Hip Flexion 3/5    Right Hip ABduction 2/5    Left Hip Flexion 5/5    Left Hip ABduction 5/5    Right/Left Knee Right;Left    Right Knee Flexion 3+/5    Right Knee Extension 3+/5    Left Knee Flexion 5/5    Left Knee Extension 5/5    Right/Left Ankle Right;Left    Right Ankle Dorsiflexion 3+/5    Left Ankle Dorsiflexion 5/5      Flexibility   Soft Tissue Assessment /Muscle Length yes    Hamstrings bilateral tightness R >L with pain    Quadriceps equal bilaterally    ITB tightness R    Piriformis pain to palpation R      Palpation   Spinal mobility hypomobility; has spinal stimulator/ general tightness    SI assessment  R torsion (R side posterior to L side)    Palpation comment Painful R palpation worse over R greater trochanteric region and distal in lateral quad and HS as it meets IT Band, R lateral sacral border, glut max/glut  med      Ambulation/Gait   Ambulation/Gait Yes    Ambulation/Gait Assistance 7: Independent    Gait Pattern Antalgic;Step-through pattern  Objective measurements completed on examination: See above findings.       [facility] Adult PT Treatment/Exercise - 07/13/20 1159      Self-Care   Self-Care Other Self-Care Comments    Other Self-Care Comments  self soft tissue mobs with massage stick and tennis ball      Exercises   Exercises Knee/Hip    Other Exercises  --      Knee/Hip Exercises: Stretches   Active Hamstring Stretch Right;Left;1 rep;30 seconds    Other Knee/Hip Stretches supine lumbar rotation--painful    Other Knee/Hip Stretches supine butterfly stretch with pillows to support LEs      Knee/Hip Exercises: Supine   Bridges 5 reps      Knee/Hip Exercises: Sidelying   Clams 5                  PT Education - 07/13/20 1231    Education Details HEP    Person(s) Educated Patient    Methods Explanation;Demonstration;Handout    Comprehension Verbalized understanding;Returned demonstration;Tactile cues required               PT Long Term Goals - 07/13/20 1241      PT LONG TERM GOAL #1   Title The patient will be indep with HEP for self soft tissue mobilization, strengthening, flexibility.    Time 6    Period Weeks    Target Date 08/24/20      PT LONG TERM GOAL #2   Title The patient will improve pain from 5/10 at baseline to < or equal to 3/10    Time 6    Period Weeks    Target Date 08/24/20      PT LONG TERM GOAL #3   Title The patient will improve functional status score from 29% to >or equal ot 56%.    Time 6    Period Weeks    Target Date 08/24/20      PT LONG TERM GOAL #4   Title The patient will improve R LE strength to 4/5 for hip flexion, abduction, knee flexion/extension and ankle DF.    Time 6    Period Weeks    Target Date 08/24/20                  Plan - 07/13/20 1243    Clinical  Impression Statement The patient is a 45 yo female presenting to OP physical therapy s/p a fall from a horse with R hip trauma and hematoma in March 2022.  She presents with impairments in AROM lumbar spine, AROM R hip, weakness t/o R LE, dec'd spinal mobility, R sacral torsion, myofascial tightness and pain limiting daily activities.  PT to address deficits to promote return to prior functional status.    Personal Factors and Comorbidities Comorbidity 2;Fitness;Past/Current Experience    Comorbidities HTN, chronic neck and back pain with prolonged use of pain meds    Examination-Activity Limitations Sleep;Squat;Stairs;Stand;Lift;Bend;Locomotion Level    Examination-Participation Restrictions Cleaning;Meal Prep;Community Activity;Laundry    Stability/Clinical Decision Making Stable/Uncomplicated    Clinical Decision Making Low    Rehab Potential Good    PT Frequency 2x / week    PT Duration 6 weeks    PT Treatment/Interventions Taping;Patient/family education;ADLs/Self Care Home Management;Therapeutic activities;Therapeutic exercise;Electrical Stimulation;Moist Heat;Aquatic Therapy;Dry needling;Manual techniques;Gait training;Stair training    PT Next Visit Plan Work on [employer] and R Sacral mobilization, LE strengthening to tolerance, stretching and STM    PT Home Exercise Plan Access Code: [email or web address]  Consulted and Agree with Plan of Care Patient           Patient will benefit from skilled therapeutic intervention in order to improve the following deficits and impairments:  Pain,Hypomobility,Impaired flexibility,Postural dysfunction,Decreased strength,Increased fascial restricitons,Decreased range of motion,Decreased activity tolerance  Visit Diagnosis: Pain in right hip  Muscle weakness (generalized)  Other symptoms and signs involving the musculoskeletal system     Problem List Patient Active Problem List   Diagnosis Date Noted  . Chronic use of opiate for therapeutic purpose  06/27/2020  . Closed fracture of multiple ribs of right side 05/08/2020  . Contusion of both lungs 05/08/2020  . Pneumothorax, right 05/08/2020  . Fall from horse 05/06/2020  . Interstitial cystitis 05/04/2020  . Vaginal candidiasis 05/04/2020  . Intractable low back pain 04/25/2020  . Appendicitis 01/01/2020  . Chronic low back pain (Right) w/ sciatica (Right) 06/21/2019  . Chronic lower extremity pain (Right) 06/21/2019  . Lumbosacral radiculopathy at L5 (Right) 06/21/2019  . 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 Lumbar facet arthropathy 06/21/2019  . Colitis 04/19/2019  . Osteoarthritis involving multiple joints 04/08/2019  . Latex precautions, history of latex allergy 04/08/2019  . Spondylosis without myelopathy or radiculopathy, lumbosacral region 02/25/2019  . DDD (degenerative disc disease), lumbar 02/25/2019  . DDD (degenerative disc disease), cervical 11/15/2018  . Cervicalgia 11/15/2018  . Abnormal MRI, shoulder (Right) 11/15/2018  . Chronic upper extremity pain (2ry area of Pain) (Bilateral) (L>R) 08/20/2018  . Chronic hip pain (3ry area of Pain) (Bilateral) (R>L) 08/20/2018  . Closed fracture of scapula, sequela (Right) 08/20/2018  . Pharmacologic therapy 08/20/2018  . Disorder of skeletal system 08/20/2018  . Problems influencing health status 08/20/2018  . Pain in right knee 03/19/2017  . Family history of thyroid disease 10/24/2016  . Neurogenic pain 10/10/2016  . Fibromyalgia 10/10/2016  . Spinal cord stimulator status 05/16/2016  . Battery end of life of spinal cord stimulator 05/16/2016  . Positive ANA (antinuclear antibody) 09/13/2015  . Lumbar spondylosis 06/29/2015  . Lumbar facet syndrome (Bilateral) (R>L) 06/29/2015  . Anxiety and depression 01/20/2015  . Long term prescription opiate use 12/19/2014  . Uncomplicated opioid dependence ([place]) 12/19/2014  . Chronic musculoskeletal pain 12/19/2014  . Osteoarthrosis 12/19/2014  . Vitamin D insufficiency 12/19/2014   . 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 Chronic low back pain (Bilateral) (R>L) w/o sciatica 12/19/2014  . Chronic lumbar radicular pain (Right L5 dermatome; Left S1 Dermatome) (Bilateral) (R>L) 12/19/2014  . Chronic neck pain (1ry area of Pain) (Bilateral) (L>R) 12/19/2014  . Chronic cervical radicular pain (C7 Dermatome) (Bilateral) (L>R) 12/19/2014  . Chronic lower extremity pain (Bilateral) (R>L) 12/19/2014  . Severe obesity with body mass index (BMI) of 35.0 to 39.9 with comorbidity ([place]) 10/27/2014  . Generalized anxiety disorder 03/16/2014  . Essential hypertension, benign 07/09/2013  . Chronic pain syndrome 07/09/2013  . GERD 07/02/2007  . Irritable bowel syndrome 07/02/2007    Haili Donofrio, PT 07/13/2020, 1:03 PM  [facility] [place], [place], [address] Phone: [phone]   Fax:  [phone]  Name: Mary Cruz MRN: [id number] Date of Birth: 12-10-1975

## 2020-07-18 ENCOUNTER — Encounter: Payer: 59 | Admitting: Physical Therapy

## 2020-07-20 ENCOUNTER — Other Ambulatory Visit: Payer: Self-pay

## 2020-07-20 ENCOUNTER — Ambulatory Visit (INDEPENDENT_AMBULATORY_CARE_PROVIDER_SITE_OTHER): Payer: 59 | Admitting: Physical Therapy

## 2020-07-20 DIAGNOSIS — M25551 Pain in right hip: Secondary | ICD-10-CM | POA: Diagnosis not present

## 2020-07-20 DIAGNOSIS — M6281 Muscle weakness (generalized): Secondary | ICD-10-CM | POA: Diagnosis not present

## 2020-07-20 DIAGNOSIS — R29898 Other symptoms and signs involving the musculoskeletal system: Secondary | ICD-10-CM

## 2020-07-20 NOTE — Therapy (Signed)
[facility] [place], [place], [address] Phone: [phone]   Fax:  [phone]  Physical Therapy Treatment  Patient Details  Name: Mary Cruz MRN: [id number] Date of Birth: 1975-08-21 Referring Provider (PT): Samuel Bouche, MD   Encounter Date: 07/20/2020   PT End of Session - 07/20/20 1112     Visit Number 2    Number of Visits 12    Date for PT Re-Evaluation 08/24/20    Authorization Type UHC    PT Start Time 1105    PT Stop Time 1148    PT Time Calculation (min) 43 min             Past Medical History:  Diagnosis Date   Abscess of axillary fold 10/10/2013   Acute bronchitis with asthma with acute exacerbation 01/23/2015   Dehydration 04/19/2019   Elevated lactic acid level 04/19/2019   Encounter for management of implanted device 12/19/2014   Encounter for therapeutic drug level monitoring 12/19/2014   Fibromyalgia    Hyperglycemia 04/19/2019   Hypertension    Implant pain at battery site (right buttocks area) 04/05/2015   KIDNEY STONES 07/02/2007   Qualifier: History of  By: Bobby Rumpf CMA (AAMA), Patty     Long term current use of opiate analgesic 12/19/2014   Lupus ([place])    Morbid obesity ([place]) 12/19/2014   Muscle cramping 12/19/2014   Opiate use (30 MME/Day) 12/19/2014   Osteoarthritis    Pain    chronic regional pain syndrome   Right knee pain 01/10/2015   Sepsis ([place]) 04/19/2019   Sepsis affecting skin 10/10/2013   Tobacco abuse 04/19/2019    Past Surgical History:  Procedure Laterality Date   BREAST SURGERY     CHOLECYSTECTOMY  2000   ENDOMETRIAL ABLATION     KNEE SURGERY     KNEE SURGERY Right 02-2015   LAPAROSCOPIC APPENDECTOMY Right 01/01/2020   Procedure: APPENDECTOMY LAPAROSCOPIC;  Surgeon: Rolm Bookbinder, MD;  Location: [facility];  Service: General;  Laterality: Right;   OCCIPITAL NERVE STIMULATOR INSERTION     SHOULDER ARTHROSCOPY WITH DISTAL CLAVICLE RESECTION Right 10/29/2018    Procedure: SHOULDER ARTHROSCOPY WITH DISTAL CLAVICLE RESECTION;  Surgeon: Hiram Gash, MD;  Location: [facility];  Service: Orthopedics;  Laterality: Right;   SHOULDER SURGERY     spinal cord stimulator     SPINAL CORD STIMULATOR IMPLANT  2008   SPINAL CORD STIMULATOR INSERTION N/A 06/21/2016   Procedure: LUMBAR SPINAL CORD STIMULATOR INSERTION;  Surgeon: Clydell Hakim, MD;  Location: [facility];  Service: Neurosurgery;  Laterality: N/A;  LUMBAR SPINAL CORD STIMULATOR INSERTION   SUBACROMIAL DECOMPRESSION Right 10/29/2018   Procedure: SUBACROMIAL DECOMPRESSION;  Surgeon: Hiram Gash, MD;  Location: [facility];  Service: Orthopedics;  Laterality: Right;   TUBAL LIGATION  2000    There were no vitals filed for this visit.   Subjective Assessment - 07/20/20 1105     Subjective Pt reports she has difficutly tolerating walking short distances.  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    Pertinent History spinal DDD, has pain mgmt doctor for back and neck;  HTN    Patient Stated Goals Be able to walk without pain    Currently in Pain? Yes    Pain Score 5     Pain Location Hip    Pain Orientation Right    Pain  Descriptors / Indicators Sore;Aching;Numbness    Pain Radiating Towards radiates behind knee and to top of foot.    Aggravating Factors  walking, bending    Pain Relieving Factors pain meds, sitting                [facility] PT Assessment - 07/20/20 0001       Assessment   Medical Diagnosis R hip pain    Referring Provider (PT) Samuel Bouche, MD    Onset Date/Surgical Date 05/06/20    Hand Dominance Right    Next MD Visit 07/24/20    Prior Therapy has tried PT in past ofr h/o chronic pain                [facility] Adult PT Treatment/Exercise - 07/20/20 0001       Knee/Hip Exercises: Stretches   Active Hamstring Stretch Right;2 reps   single leg long sitting   Hip Flexor Stretch Right;30  seconds;3 reps;Left;1 rep   seated in chair   Piriformis Stretch Right;3 reps;20 seconds   supine with strap and modified pigeon pose   Other Knee/Hip Stretches supine fig 4 x 15 sec x 1 rep each leg.    Other Knee/Hip Stretches supine butterfly x 30 sec x 2.      Knee/Hip Exercises: Aerobic   Nustep L3: 5 min for warm up    Other Aerobic 3 single laps around gym to assist response to exercises      Knee/Hip Exercises: Seated   Sit to Sand --   3 reps, eccentric lowering     Knee/Hip Exercises: Supine   Bridges 1 set;10 reps   cues for TA and glute set, reduced pain   Other Supine Knee/Hip Exercises sit to/from supine x 2 reps via log roll.      Knee/Hip Exercises: Sidelying   Clams RLE x 10, reverse x 10      Manual Therapy   Manual Therapy Taping    Manual therapy comments Star shape of [place] tape applied to Rt lateral hip to decompress and assist with edema reduction and increase proprioception                         PT Long Term Goals - 07/13/20 1241       PT LONG TERM GOAL #1   Title The patient will be indep with HEP for self soft tissue mobilization, strengthening, flexibility.    Time 6    Period Weeks    Target Date 08/24/20      PT LONG TERM GOAL #2   Title The patient will improve pain from 5/10 at baseline to < or equal to 3/10    Time 6    Period Weeks    Target Date 08/24/20      PT LONG TERM GOAL #3   Title The patient will improve functional status score from 29% to >or equal ot 56%.    Time 6    Period Weeks    Target Date 08/24/20      PT LONG TERM GOAL #4   Title The patient will improve R LE strength to 4/5 for hip flexion, abduction, knee flexion/extension and ankle DF.    Time 6    Period Weeks    Target Date 08/24/20                   Plan - 07/20/20 1253     Clinical Impression  Statement Pt continues with increased tightness in Rt hip with walking distances greater than 50-100 ft.  Pt reported reduced  tightness after completion of hip flexor and piriformis stretch.  No increase in pain during session. Goals are ongoing.    Personal Factors and Comorbidities Comorbidity 2;Fitness;Past/Current Experience    Comorbidities HTN, chronic neck and back pain with prolonged use of pain meds    Examination-Activity Limitations Sleep;Squat;Stairs;Stand;Lift;Bend;Locomotion Level    Examination-Participation Restrictions Cleaning;Meal Prep;Community Activity;Laundry    Stability/Clinical Decision Making Stable/Uncomplicated    Rehab Potential Good    PT Frequency 2x / week    PT Duration 6 weeks    PT Treatment/Interventions Taping;Patient/family education;ADLs/Self Care Home Management;Therapeutic activities;Therapeutic exercise;Electrical Stimulation;Moist Heat;Aquatic Therapy;Dry needling;Manual techniques;Gait training;Stair training    PT Next Visit Plan Work on [employer] and R Sacral mobilization, LE strengthening to tolerance, stretching and STM    PT Home Exercise Plan Access Code: [email or web address]    Consulted and Agree with Plan of Care Patient             Patient will benefit from skilled therapeutic intervention in order to improve the following deficits and impairments:  Pain, Hypomobility, Impaired flexibility, Postural dysfunction, Decreased strength, Increased fascial restricitons, Decreased range of motion, Decreased activity tolerance  Visit Diagnosis: Pain in right hip  Muscle weakness (generalized)  Other symptoms and signs involving the musculoskeletal system     Problem List Patient Active Problem List   Diagnosis Date Noted   Chronic use of opiate for therapeutic purpose 06/27/2020   Closed fracture of multiple ribs of right side 05/08/2020   Contusion of both lungs 05/08/2020   Pneumothorax, right 05/08/2020   Fall from horse 05/06/2020   Interstitial cystitis 05/04/2020   Vaginal candidiasis 05/04/2020   Intractable low back pain 04/25/2020   Appendicitis 01/01/2020    Chronic low back pain (Right) w/ sciatica (Right) 06/21/2019   Chronic lower extremity pain (Right) 06/21/2019   Lumbosacral radiculopathy at L5 (Right) 06/21/2019   Dropfoot (Right) 06/21/2019   Lumbar facet arthropathy 06/21/2019   Colitis 04/19/2019   Osteoarthritis involving multiple joints 04/08/2019   Latex precautions, history of latex allergy 04/08/2019   Spondylosis without myelopathy or radiculopathy, lumbosacral region 02/25/2019   DDD (degenerative disc disease), lumbar 02/25/2019   DDD (degenerative disc disease), cervical 11/15/2018   Cervicalgia 11/15/2018   Abnormal MRI, shoulder (Right) 11/15/2018   Chronic upper extremity pain (2ry area of Pain) (Bilateral) (L>R) 08/20/2018   Chronic hip pain (3ry area of Pain) (Bilateral) (R>L) 08/20/2018   Closed fracture of scapula, sequela (Right) 08/20/2018   Pharmacologic therapy 08/20/2018   Disorder of skeletal system 08/20/2018   Problems influencing health status 08/20/2018   Pain in right knee 03/19/2017   Family history of thyroid disease 10/24/2016   Neurogenic pain 10/10/2016   Fibromyalgia 10/10/2016   Spinal cord stimulator status 05/16/2016   Battery end of life of spinal cord stimulator 05/16/2016   Positive ANA (antinuclear antibody) 09/13/2015   Lumbar spondylosis 06/29/2015   Lumbar facet syndrome (Bilateral) (R>L) 06/29/2015   Anxiety and depression 01/20/2015   Long term prescription opiate use 24/82/5003   Uncomplicated opioid dependence ([place]) 12/19/2014   Chronic musculoskeletal pain 12/19/2014   Osteoarthrosis 12/19/2014   Vitamin D insufficiency 12/19/2014   Presence of functional implant (Medtronic Lumbar spinal cord stimulator implant) 12/19/2014   Chronic low back pain (Bilateral) (R>L) w/o sciatica 12/19/2014   Chronic lumbar radicular pain (Right L5 dermatome; Left S1 Dermatome) (Bilateral) (R>L) 12/19/2014  Chronic neck pain (1ry area of Pain) (Bilateral) (L>R) 12/19/2014   Chronic cervical  radicular pain (C7 Dermatome) (Bilateral) (L>R) 12/19/2014   Chronic lower extremity pain (Bilateral) (R>L) 12/19/2014   Severe obesity with body mass index (BMI) of 35.0 to 39.9 with comorbidity ([place]) 10/27/2014   Generalized anxiety disorder 03/16/2014   Essential hypertension, benign 07/09/2013   Chronic pain syndrome 07/09/2013   GERD 07/02/2007   Irritable bowel syndrome 07/02/2007    Kerin Perna, PTA 07/20/20 1:17 PM   [facility] [place], [place], [address] Phone: [phone]   Fax:  [phone]  Name: Mary Cruz MRN: [id number] Date of Birth: 08-20-1975

## 2020-07-20 NOTE — Patient Instructions (Addendum)

## 2020-07-23 NOTE — Progress Notes (Deleted)
No-show.  Patient apparently sick.

## 2020-07-24 ENCOUNTER — Encounter: Payer: 59 | Admitting: Pain Medicine

## 2020-07-25 ENCOUNTER — Encounter: Payer: 59 | Admitting: Rehabilitative and Restorative Service Providers"

## 2020-07-27 ENCOUNTER — Encounter: Payer: 59 | Admitting: Rehabilitative and Restorative Service Providers"

## 2020-07-27 ENCOUNTER — Other Ambulatory Visit: Payer: Self-pay | Admitting: Medical-Surgical

## 2020-07-31 ENCOUNTER — Other Ambulatory Visit: Payer: Self-pay

## 2020-07-31 ENCOUNTER — Ambulatory Visit: Payer: 59 | Attending: Pain Medicine | Admitting: Pain Medicine

## 2020-07-31 ENCOUNTER — Encounter: Payer: Self-pay | Admitting: Pain Medicine

## 2020-07-31 VITALS — BP 138/99 | HR 109 | Temp 97.5°F | Resp 16 | Ht 64.0 in | Wt 256.0 lb

## 2020-07-31 DIAGNOSIS — M25551 Pain in right hip: Secondary | ICD-10-CM | POA: Insufficient documentation

## 2020-07-31 DIAGNOSIS — M79602 Pain in left arm: Secondary | ICD-10-CM | POA: Diagnosis present

## 2020-07-31 DIAGNOSIS — G8929 Other chronic pain: Secondary | ICD-10-CM | POA: Diagnosis present

## 2020-07-31 DIAGNOSIS — M25552 Pain in left hip: Secondary | ICD-10-CM

## 2020-07-31 DIAGNOSIS — Z79899 Other long term (current) drug therapy: Secondary | ICD-10-CM | POA: Diagnosis present

## 2020-07-31 DIAGNOSIS — Z79891 Long term (current) use of opiate analgesic: Secondary | ICD-10-CM | POA: Insufficient documentation

## 2020-07-31 DIAGNOSIS — G894 Chronic pain syndrome: Secondary | ICD-10-CM

## 2020-07-31 DIAGNOSIS — M542 Cervicalgia: Secondary | ICD-10-CM | POA: Insufficient documentation

## 2020-07-31 DIAGNOSIS — M79601 Pain in right arm: Secondary | ICD-10-CM | POA: Insufficient documentation

## 2020-07-31 MED ORDER — OXYCODONE HCL 5 MG PO TABS: 5.0000 mg | ORAL_TABLET | Freq: Four times a day (QID) | ORAL | 0 refills | Status: DC | PRN

## 2020-07-31 NOTE — Progress Notes (Signed)
PROVIDER NOTE: Information contained herein reflects review and annotations entered in association with encounter. Interpretation of such information and data should be left to medically-trained personnel. Information provided to patient can be located elsewhere in the medical record under "Patient Instructions". Document created using STT-dictation technology, any transcriptional errors that may result from process are unintentional.    Patient: Mary Cruz  Service Category: E/M  Provider: Gaspar Cola, MD  DOB: July 26, 1975  DOS: 07/31/2020  Specialty: Interventional Pain Management  MRN: [id number]  Setting: Ambulatory outpatient  PCP: Mary Bouche, NP  Type: Established Patient    Referring Provider: Samuel Bouche, NP  Location: Office  Delivery: Face-to-face     HPI  [name], a 45 y.o. year old female, is here today because of her Chronic pain syndrome [G89.4]. [name] primary complain today is Back Pain (Lumbar bilateral), Hip Pain (Right ), Neck Pain (center), and Migraine (Headache that goes into a migraine ) Last encounter: My last encounter with her was on 07/24/2020. Pertinent problems: [name] has Chronic pain syndrome; Chronic musculoskeletal pain; Osteoarthrosis; Presence of functional implant ([employer] Lumbar spinal cord stimulator implant); Chronic low back pain (Bilateral) (R>L) w/o sciatica; Chronic lumbar radicular pain (Right L5 dermatome; Left S1 Dermatome) (Bilateral) (R>L); Chronic neck pain (1ry area of Pain) (Bilateral) (L>R); Chronic cervical radicular pain (C7 Dermatome) (Bilateral) (L>R); Chronic lower extremity pain (Bilateral) (R>L); Lumbar spondylosis; Lumbar facet syndrome (Bilateral) (R>L); Neurogenic pain; Fibromyalgia; Pain in right knee; Chronic upper extremity pain (2ry area of Pain) (Bilateral) (L>R); Chronic hip pain (3ry area of Pain) (Bilateral) (R>L); Closed fracture of scapula, sequela (Right); DDD (degenerative disc disease),  cervical; Cervicalgia; Abnormal MRI, shoulder (Right); Spondylosis without myelopathy or radiculopathy, lumbosacral region; DDD (degenerative disc disease), lumbar; Osteoarthritis involving multiple joints; Chronic low back pain (Right) w/ sciatica (Right); Chronic lower extremity pain (Right); Lumbosacral radiculopathy at L5 (Right); Dropfoot (Right); Lumbar facet arthropathy; Intractable low back pain; and Closed fracture of multiple ribs of right side on their pertinent problem list. Pain Assessment: Severity of Chronic pain is reported as a 5 /10. Location: Back (see visit info for additional pain sites.) Lower, Left, Right/pain into right leg all the way down to toes.. Onset: More than a month ago. Quality: Discomfort, Constant, Shooting, Stabbing, Numbness, Burning. Timing: Constant. Modifying factor(s): rest, medications do not seem to be helping as much as they used to.. Vitals:  height is 5' 4"  (1.626 m) and weight is 256 lb (116.1 kg). Her temporal temperature is 97.5 F (36.4 C) (abnormal). Her blood pressure is 138/99 (abnormal) and her pulse is 109 (abnormal). Her respiration is 16 and oxygen saturation is 100%.   Reason for encounter: medication management.   The patient indicates doing well with the current medication regimen. No adverse reactions or side effects reported to the medications.   The PMP indicates that on 05/08/2020 she had a prescription for hydrocodone/APAP 5/325 #20 provided to her by the [facility] pharmacy.  On the same date, she also had a prescription for tramadol 50 mg tablets #20 provided to her, also by the [facility] pharmacy.  The patient did call on 05/08/2020 indicating that she had been involved in a horse accident where she fell and she ended up at [facility] where they did provide her with some extra pain medication.  She was compliant with telling us about the incident.  No violation to our medication agreement.  Today she is complaining about  pain in the area  of the right hip.  She is starting aquatic therapy this week.  She is also pending an evaluation by her PCP for hypertension associated with chest discomfort and headaches.  We will hold on any interventional therapies until she is cleared from any possible cardiovascular problems.  RTCB: 11/05/2020 Nonopioids transferred 04/06/2020: Robaxin  Pharmacotherapy Assessment  Analgesic: Oxycodone IR 5 mg, 1 tab PO q 6 hrs (20 mg/day of oxycodone) MME/day: 30 mg/day.   Monitoring: [place] PMP: PDMP reviewed during this encounter.       Pharmacotherapy: No side-effects or adverse reactions reported. Compliance: No problems identified. Effectiveness: Clinically acceptable.  Mary Billow, RN  07/31/2020  3:37 PM  Sign when Signing Visit Nursing Pain Medication Assessment:  Safety precautions to be maintained throughout the outpatient stay will include: orient to surroundings, keep bed in low position, maintain call bell within reach at all times, provide assistance with transfer out of bed and ambulation.  Medication Inspection Compliance: Pill count conducted under aseptic conditions, in front of the patient. Neither the pills nor the bottle was removed from the patient's sight at any time. Once count was completed pills were immediately returned to the patient in their original bottle.  Medication: Oxycodone IR Pill/Patch Count:  62 of 120 pills remain Pill/Patch Appearance: Markings consistent with prescribed medication Bottle Appearance: Standard pharmacy container. Clearly labeled. Filled Date: 05 / 28 / 2022 Last Medication intake:  Today    UDS:  Summary  Date Value Ref Range Status  02/13/2018 FINAL  Final    Comment:    ==================================================================== TOXASSURE SELECT 13 (MW) ==================================================================== Test                             Result       Flag       Units Drug Present and  Declared for Prescription Verification   Oxycodone                      2035         EXPECTED   ng/mg creat   Oxymorphone                    564          EXPECTED   ng/mg creat   Noroxycodone                   1930         EXPECTED   ng/mg creat   Noroxymorphone                 142          EXPECTED   ng/mg creat    Sources of oxycodone are scheduled prescription medications.    Oxymorphone, noroxycodone, and noroxymorphone are expected    metabolites of oxycodone. Oxymorphone is also available as a    scheduled prescription medication. Drug Absent but Declared for Prescription Verification   Clonazepam                     Not Detected UNEXPECTED ng/mg creat ==================================================================== Test                      Result    Flag   Units      Ref Range   Creatinine              103  mg/dL      >=20 ==================================================================== Declared Medications:  The flagging and interpretation on this report are based on the  following declared medications.  Unexpected results may arise from  inaccuracies in the declared medications.  **Note: The testing scope of this panel includes these medications:  Clonazepam (Klonopin)  Oxycodone (Roxicodone)  **Note: The testing scope of this panel does not include following  reported medications:  Albuterol  Amitriptyline (Elavil)  Cholecalciferol  Diclofenac  Duloxetine (Cymbalta)  Esomeprazole  Methocarbamol (Robaxin)  Naproxen  Omeprazole (Prilosec)  Prednisone  Pregabalin (Lyrica)  Quetiapine (Seroquel) ==================================================================== For clinical consultation, please call [phone]. ====================================================================      ROS  Constitutional: Denies any fever or chills Gastrointestinal: No reported hemesis, hematochezia, vomiting, or acute GI distress Musculoskeletal: Denies  any acute onset joint swelling, redness, loss of ROM, or weakness Neurological: No reported episodes of acute onset apraxia, aphasia, dysarthria, agnosia, amnesia, paralysis, loss of coordination, or loss of consciousness  Medication Review  Probiotic Product, amitriptyline, clonazePAM, hyoscyamine, lisinopril-hydrochlorothiazide, methocarbamol, omeprazole, oxyCODONE, and rizatriptan  History Review  Allergy: Ms. Ell is allergic to celecoxib, doxycycline, gabapentin, latex, nabumetone, pentazocine lactate, seroquel [quetiapine], amoxicillin, bactrim [sulfamethoxazole-trimethoprim], cephalexin, erythromycin, hydromorphone hcl, and lyrica [pregabalin]. Drug: Ms. Game  reports no history of drug use. Alcohol:  reports no history of alcohol use. Tobacco:  reports that she has quit smoking. Her smoking use included cigarettes. She quit smokeless tobacco use about 2 years ago. Social: Ms. Coop  reports that she has quit smoking. Her smoking use included cigarettes. She quit smokeless tobacco use about 2 years ago. She reports that she does not drink alcohol and does not use drugs. Medical:  has a past medical history of Abscess of axillary fold (10/10/2013), Acute bronchitis with asthma with acute exacerbation (01/23/2015), Dehydration (04/19/2019), Elevated lactic acid level (04/19/2019), Encounter for management of implanted device (12/19/2014), Encounter for therapeutic drug level monitoring (12/19/2014), Fibromyalgia, Hyperglycemia (04/19/2019), Hypertension, Implant pain at battery site (right buttocks area) (04/05/2015), KIDNEY STONES (07/02/2007), Long term current use of opiate analgesic (12/19/2014), Lupus ([place]), Morbid obesity ([place]) (12/19/2014), Muscle cramping (12/19/2014), Opiate use (30 MME/Day) (12/19/2014), Osteoarthritis, Pain, Right knee pain (01/10/2015), Sepsis ([place]) (04/19/2019), Sepsis affecting skin (10/10/2013), and Tobacco abuse (04/19/2019). Surgical: Ms. Lezotte  has a past surgical history  that includes spinal cord stimulator; Occipital nerve stimulator insertion; Cholecystectomy (2000); Knee surgery; Shoulder surgery; Tubal ligation (2000); Endometrial ablation; Spinal cord stimulator implant (2008); Knee surgery (Right, 02-2015); Breast surgery; Spinal cord stimulator insertion (N/A, 06/21/2016); Shoulder arthroscopy with distal clavicle resection (Right, 10/29/2018); Subacromial decompression (Right, 10/29/2018); and laparoscopic appendectomy (Right, 01/01/2020). Family: family history includes Asthma in her mother; Depression in her mother; Fibromyalgia in an other family member; Stroke in an other family member; Thyroid disease in an other family member.  Laboratory Chemistry Profile   Renal Lab Results  Component Value Date   BUN 7 12/31/2019   CREATININE 0.95 12/31/2019   LABCREA 47.20 30/86/5784   BCR NOT APPLICABLE 69/62/9528   GFRAA >60 04/21/2019   GFRNONAA >60 12/31/2019     Hepatic Lab Results  Component Value Date   AST 20 04/20/2019   ALT 19 04/20/2019   ALBUMIN 2.7 (L) 04/20/2019   ALKPHOS 88 04/20/2019   LIPASE 23 04/19/2019     Electrolytes Lab Results  Component Value Date   NA 138 12/31/2019   K 3.0 (L) 12/31/2019   CL 100 12/31/2019   CALCIUM 9.1 12/31/2019   MG 2.0 04/20/2019   PHOS  3.2 04/20/2019     Bone Lab Results  Component Value Date   VD25OH 25 (L) 10/24/2016     Inflammation (CRP: Acute Phase) (ESR: Chronic Phase) Lab Results  Component Value Date   CRP 1.0 (H) 04/05/2015   ESRSEDRATE 35 (H) 04/05/2015   LATICACIDVEN 1.8 04/20/2019       Note: Above Lab results reviewed.  Recent Imaging Review  DG Lumbar Spine Complete CLINICAL DATA:  Right hip pain. Paresthesias of right hip into groin. Low back pain. Injury March 26, follow-up force.  EXAM: LUMBAR SPINE - COMPLETE 4+ VIEW  COMPARISON:  Lumbar spine radiograph 04/26/2018  FINDINGS: There are 5 non-rib-bearing lumbar vertebra. Trace retrolisthesis of L4 on L5,  unchanged from prior exam. Vertebral body heights are normal. No evidence of fracture. Posterior elements are intact. Disc spaces are preserved. Minimal endplate spurring at I3-B0. Spinal stimulator in place. The sacroiliac joints are congruent.  IMPRESSION: 1. No acute or healing fracture of the lumbar spine. 2. Trace retrolisthesis of L4 on L5, unchanged from prior exam. Minimal degenerative endplate spurring at W8-G8.  Electronically Signed   By: Keith Rake M.D.   On: 07/01/2020 02:05 Note: Reviewed        Physical Exam  General appearance: Well nourished, well developed, and well hydrated. In no apparent acute distress Mental status: Alert, oriented x 3 (person, place, & time)       Respiratory: No evidence of acute respiratory distress Eyes: PERLA Vitals: BP (!) 138/99 (BP Location: Right Arm, Patient Position: Sitting, Cuff Size: Normal) Comment (Cuff Size): forearm  Pulse (!) 109   Temp (!) 97.5 F (36.4 C) (Temporal)   Resp 16   Ht 5' 4"  (1.626 m)   Wt 256 lb (116.1 kg)   SpO2 100%   BMI 43.94 kg/m  BMI: Estimated body mass index is 43.94 kg/m as calculated from the following:   Height as of this encounter: 5' 4"  (1.626 m).   Weight as of this encounter: 256 lb (116.1 kg). Ideal: Ideal body weight: 54.7 kg (120 lb 9.5 oz) Adjusted ideal body weight: 79.3 kg (174 lb 12.1 oz)  Assessment   Status Diagnosis  Controlled Controlled Controlled 1. Chronic pain syndrome   2. Chronic neck pain (1ry area of Pain) (Bilateral) (L>R)   3. Chronic upper extremity pain (2ry area of Pain) (Bilateral) (L>R)   4. Chronic hip pain (3ry area of Pain) (Bilateral) (R>L)   5. Pharmacologic therapy   6. Chronic use of opiate for therapeutic purpose      Updated Problems: No problems updated.  Plan of Care  Problem-specific:  No problem-specific Assessment & Plan notes found for this encounter.  Ms. NATEISHA MOYD has a current medication list which includes the  following long-term medication(s): amitriptyline, clonazepam, hyoscyamine, lisinopril-hydrochlorothiazide, methocarbamol, omeprazole, [START ON 08/07/2020] oxycodone, [START ON 09/06/2020] oxycodone, [START ON 10/06/2020] oxycodone, and rizatriptan.  Pharmacotherapy (Medications Ordered): Meds ordered this encounter  Medications   oxyCODONE (OXY IR/ROXICODONE) 5 MG immediate release tablet    Sig: Take 1 tablet (5 mg total) by mouth every 6 (six) hours as needed for severe pain. Must last 30 days    Dispense:  120 tablet    Refill:  0    Not a duplicate. Do NOT delete! Dispense 1 day early if closed on refill date. Avoid benzodiazepines within 8 hours of opioids. Do not send refill requests.   oxyCODONE (OXY IR/ROXICODONE) 5 MG immediate release tablet    Sig: Take  1 tablet (5 mg total) by mouth every 6 (six) hours as needed for severe pain. Must last 30 days    Dispense:  120 tablet    Refill:  0    Not a duplicate. Do NOT delete! Dispense 1 day early if closed on refill date. Avoid benzodiazepines within 8 hours of opioids. Do not send refill requests.   oxyCODONE (OXY IR/ROXICODONE) 5 MG immediate release tablet    Sig: Take 1 tablet (5 mg total) by mouth every 6 (six) hours as needed for severe pain. Must last 30 days    Dispense:  120 tablet    Refill:  0    Not a duplicate. Do NOT delete! Dispense 1 day early if closed on refill date. Avoid benzodiazepines within 8 hours of opioids. Do not send refill requests.    Orders:  Orders Placed This Encounter  Procedures   ToxASSURE Select 13 (MW), Urine    Volume: 30 ml(s). Minimum 3 ml of urine is needed. Document temperature of fresh sample. Indications: Long term (current) use of opiate analgesic (N18.335)    Order Specific Question:   Release to patient    Answer:   Immediate    Follow-up plan:   Return in about 3 months (around 11/05/2020) for evaluation day (F2F) (MM).     Interventional treatment options:  Under consideration:    Diagnostic right suprascapular nerve block #1  Repeat therapeutic bilateral lumbar facet RFA    Therapeutic/palliative (PRN):   Palliative bilateral lumbar facet blocks  Palliative right lumbar facet RFA #3 (05/13/2019)  Palliative left lumbar facet RFA #3 (04/08/2019)  Palliative left C7-T1 CESI  PRN SCS programming adjustments (2018 replacement by Dr. Clydell Hakim)     Recent Visits Date Type Provider Dept  06/28/20 Telemedicine Milinda Pointer, MD [facility]  Showing recent visits within past 90 days and meeting all other requirements Today's Visits Date Type Provider Dept  07/31/20 Office Visit Milinda Pointer, MD [facility]  Showing today's visits and meeting all other requirements Future Appointments No visits were found meeting these conditions. Showing future appointments within next 90 days and meeting all other requirements I discussed the assessment and treatment plan with the patient. The patient was provided an opportunity to ask questions and all were answered. The patient agreed with the plan and demonstrated an understanding of the instructions.  Patient advised to call back or seek an in-person evaluation if the symptoms or condition worsens.  Duration of encounter: 30 minutes.  Note by: Mary Cola, MD Date: 07/31/2020; Time: 3:40 PM

## 2020-07-31 NOTE — Patient Instructions (Signed)
____________________________________________________________________________________________  Medication Rules  Purpose: To inform patients, and their family members, of our rules and regulations.  Applies to: All patients receiving prescriptions (written or electronic).  Pharmacy of record: Pharmacy where electronic prescriptions will be sent. If written prescriptions are taken to a different pharmacy, please inform the nursing staff. The pharmacy listed in the electronic medical record should be the one where you would like electronic prescriptions to be sent.  Electronic prescriptions: In compliance with the  Strengthen Opioid Misuse Prevention (STOP) Act of 2017 (Session Law 2017-74/H243), effective February 11, 2018, all controlled substances must be electronically prescribed. Calling prescriptions to the pharmacy will cease to exist.  Prescription refills: Only during scheduled appointments. Applies to all prescriptions.  NOTE: The following applies primarily to controlled substances (Opioid* Pain Medications).   Type of encounter (visit): For patients receiving controlled substances, face-to-face visits are required. (Not an option or up to the patient.)  Patient's responsibilities: Pain Pills: Bring all pain pills to every appointment (except for procedure appointments). Pill Bottles: Bring pills in original pharmacy bottle. Always bring the newest bottle. Bring bottle, even if empty. Medication refills: You are responsible for knowing and keeping track of what medications you take and those you need refilled. The day before your appointment: write a list of all prescriptions that need to be refilled. The day of the appointment: give the list to the admitting nurse. Prescriptions will be written only during appointments. No prescriptions will be written on procedure days. If you forget a medication: it will not be "Called in", "Faxed", or "electronically sent". You will  need to get another appointment to get these prescribed. No early refills. Do not call asking to have your prescription filled early. Prescription Accuracy: You are responsible for carefully inspecting your prescriptions before leaving our office. Have the discharge nurse carefully go over each prescription with you, before taking them home. Make sure that your name is accurately spelled, that your address is correct. Check the name and dose of your medication to make sure it is accurate. Check the number of pills, and the written instructions to make sure they are clear and accurate. Make sure that you are given enough medication to last until your next medication refill appointment. Taking Medication: Take medication as prescribed. When it comes to controlled substances, taking less pills or less frequently than prescribed is permitted and encouraged. Never take more pills than instructed. Never take medication more frequently than prescribed.  Inform other Doctors: Always inform, all of your healthcare providers, of all the medications you take. Pain Medication from other Providers: You are not allowed to accept any additional pain medication from any other Doctor or Healthcare provider. There are two exceptions to this rule. (see below) In the event that you require additional pain medication, you are responsible for notifying us, as stated below. Cough Medicine: Often these contain an opioid, such as codeine or hydrocodone. Never accept or take cough medicine containing these opioids if you are already taking an opioid* medication. The combination may cause respiratory failure and death. Medication Agreement: You are responsible for carefully reading and following our Medication Agreement. This must be signed before receiving any prescriptions from our practice. Safely store a copy of your signed Agreement. Violations to the Agreement will result in no further prescriptions. (Additional copies of our  Medication Agreement are available upon request.) Laws, Rules, & Regulations: All patients are expected to follow all Federal and State Laws, Statutes, Rules, & Regulations. Ignorance of   the Laws does not constitute a valid excuse.  Illegal drugs and Controlled Substances: The use of illegal substances (including, but not limited to marijuana and its derivatives) and/or the illegal use of any controlled substances is strictly prohibited. Violation of this rule may result in the immediate and permanent discontinuation of any and all prescriptions being written by our practice. The use of any illegal substances is prohibited. Adopted CDC guidelines & recommendations: Target dosing levels will be at or below 60 MME/day. Use of benzodiazepines** is not recommended.  Exceptions: There are only two exceptions to the rule of not receiving pain medications from other Healthcare Providers. Exception #1 (Emergencies): In the event of an emergency (i.e.: accident requiring emergency care), you are allowed to receive additional pain medication. However, you are responsible for: As soon as you are able, call our office [phone], at any time of the day or night, and leave a message stating your name, the date and nature of the emergency, and the name and dose of the medication prescribed. In the event that your call is answered by a member of our staff, make sure to document and save the date, time, and the name of the person that took your information.  Exception #2 (Planned Surgery): In the event that you are scheduled by another doctor or dentist to have any type of surgery or procedure, you are allowed (for a period no longer than 30 days), to receive additional pain medication, for the acute post-op pain. However, in this case, you are responsible for picking up a copy of our "Post-op Pain Management for Surgeons" handout, and giving it to your surgeon or dentist. This document is available at our office, and  does not require an appointment to obtain it. Simply go to our office during business hours (Monday-Thursday from 8:00 AM to 4:00 PM) (Friday 8:00 AM to 12:00 Noon) or if you have a scheduled appointment with us, prior to your surgery, and ask for it by name. In addition, you are responsible for: calling our office [phone], at any time of the day or night, and leaving a message stating your name, name of your surgeon, type of surgery, and date of procedure or surgery. Failure to comply with your responsibilities may result in termination of therapy involving the controlled substances.  *Opioid medications include: morphine, codeine, oxycodone, oxymorphone, hydrocodone, hydromorphone, meperidine, tramadol, tapentadol, buprenorphine, fentanyl, methadone. **Benzodiazepine medications include: diazepam (Valium), alprazolam (Xanax), clonazepam (Klonopine), lorazepam (Ativan), clorazepate (Tranxene), chlordiazepoxide (Librium), estazolam (Prosom), oxazepam (Serax), temazepam (Restoril), triazolam (Halcion) (Last updated: 01/10/2020) ____________________________________________________________________________________________  ____________________________________________________________________________________________  Medication Recommendations and Reminders  Applies to: All patients receiving prescriptions (written and/or electronic).  Medication Rules & Regulations: These rules and regulations exist for your safety and that of others. They are not flexible and neither are we. Dismissing or ignoring them will be considered "non-compliance" with medication therapy, resulting in complete and irreversible termination of such therapy. (See document titled "Medication Rules" for more details.) In all conscience, because of safety reasons, we cannot continue providing a therapy where the patient does not follow instructions.  Pharmacy of record:  Definition: This is the pharmacy where your electronic  prescriptions will be sent.  We do not endorse any particular pharmacy, however, we have experienced problems with [facility] not securing enough medication supply for the community. We do not restrict you in your choice of pharmacy. However, once we write for your prescriptions, we will NOT be re-sending more prescriptions to fix restricted supply problems   created by your pharmacy, or your insurance.  The pharmacy listed in the electronic medical record should be the one where you want electronic prescriptions to be sent. If you choose to change pharmacy, simply notify our nursing staff.  Recommendations: Keep all of your pain medications in a safe place, under lock and key, even if you live alone. We will NOT replace lost, stolen, or damaged medication. After you fill your prescription, take 1 week's worth of pills and put them away in a safe place. You should keep a separate, properly labeled bottle for this purpose. The remainder should be kept in the original bottle. Use this as your primary supply, until it runs out. Once it's gone, then you know that you have 1 week's worth of medicine, and it is time to come in for a prescription refill. If you do this correctly, it is unlikely that you will ever run out of medicine. To make sure that the above recommendation works, it is very important that you make sure your medication refill appointments are scheduled at least 1 week before you run out of medicine. To do this in an effective manner, make sure that you do not leave the office without scheduling your next medication management appointment. Always ask the nursing staff to show you in your prescription , when your medication will be running out. Then arrange for the receptionist to get you a return appointment, at least 7 days before you run out of medicine. Do not wait until you have 1 or 2 pills left, to come in. This is very poor planning and does not take into consideration that we may need to  cancel appointments due to bad weather, sickness, or emergencies affecting our staff. DO NOT ACCEPT A "Partial Fill": If for any reason your pharmacy does not have enough pills/tablets to completely fill or refill your prescription, do not allow for a "partial fill". The law allows the pharmacy to complete that prescription within 72 hours, without requiring a new prescription. If they do not fill the rest of your prescription within those 72 hours, you will need a separate prescription to fill the remaining amount, which we will NOT provide. If the reason for the partial fill is your insurance, you will need to talk to the pharmacist about payment alternatives for the remaining tablets, but again, DO NOT ACCEPT A PARTIAL FILL, unless you can trust your pharmacist to obtain the remainder of the pills within 72 hours.  Prescription refills and/or changes in medication(s):  Prescription refills, and/or changes in dose or medication, will be conducted only during scheduled medication management appointments. (Applies to both, written and electronic prescriptions.) No refills on procedure days. No medication will be changed or started on procedure days. No changes, adjustments, and/or refills will be conducted on a procedure day. Doing so will interfere with the diagnostic portion of the procedure. No phone refills. No medications will be "called into the pharmacy". No Fax refills. No weekend refills. No Holliday refills. No after hours refills.  Remember:  Business hours are:  Monday to Thursday 8:00 AM to 4:00 PM Provider's Schedule: Mayara Paulson, MD - Appointments are:  Medication management: Monday and Wednesday 8:00 AM to 4:00 PM Procedure day: Tuesday and Thursday 7:30 AM to 4:00 PM Bilal Lateef, MD - Appointments are:  Medication management: Tuesday and Thursday 8:00 AM to 4:00 PM Procedure day: Monday and Wednesday 7:30 AM to 4:00 PM (Last update:  09/01/2019) ____________________________________________________________________________________________  ____________________________________________________________________________________________  CBD (cannabidiol) WARNING    Applicable to: All individuals currently taking or considering taking CBD (cannabidiol) and, more important, all patients taking opioid analgesic controlled substances (pain medication). (Example: oxycodone; oxymorphone; hydrocodone; hydromorphone; morphine; methadone; tramadol; tapentadol; fentanyl; buprenorphine; butorphanol; dextromethorphan; meperidine; codeine; etc.)  Legal status: CBD remains a Schedule I drug prohibited for any use. CBD is illegal with one exception. In the [place], CBD has a limited Food and Drug Administration (FDA) approval for the treatment of two specific types of epilepsy disorders. Only one CBD product has been approved by the FDA for this purpose: "Epidiolex". FDA is aware that some companies are marketing products containing cannabis and cannabis-derived compounds in ways that violate the Federal Food, Drug and Cosmetic Act (FD&C Act) and that may put the health and safety of consumers at risk. The FDA, a Federal agency, has not enforced the CBD status since 2018.   Legality: Some manufacturers ship CBD products nationally, which is illegal. Often such products are sold online and are therefore available throughout the country. CBD is openly sold in head shops and health food stores in some states where such sales have not been explicitly legalized. Selling unapproved products with unsubstantiated therapeutic claims is not only a violation of the law, but also can put patients at risk, as these products have not been proven to be safe or effective. Federal illegality makes it difficult to conduct research on CBD.  Reference: "FDA Regulation of Cannabis and Cannabis-Derived Products, Including Cannabidiol (CBD)" -  https://www.fda.gov/news-events/public-health-focus/fda-regulation-cannabis-and-cannabis-derived-products-including-cannabidiol-cbd  Warning: CBD is not FDA approved and has not undergo the same manufacturing controls as prescription drugs.  This means that the purity and safety of available CBD may be questionable. Most of the time, despite manufacturer's claims, it is contaminated with THC (delta-9-tetrahydrocannabinol - the chemical in marijuana responsible for the "HIGH").  When this is the case, the THC contaminant will trigger a positive urine drug screen (UDS) test for Marijuana (carboxy-THC). Because a positive UDS for any illicit substance is a violation of our medication agreement, your opioid analgesics (pain medicine) may be permanently discontinued.  MORE ABOUT CBD  General Information: CBD  is a derivative of the Marijuana (cannabis sativa) plant discovered in 1940. It is one of the 113 identified substances found in Marijuana. It accounts for up to 40% of the plant's extract. As of 2018, preliminary clinical studies on CBD included research for the treatment of anxiety, movement disorders, and pain. CBD is available and consumed in multiple forms, including inhalation of smoke or vapor, as an aerosol spray, and by mouth. It may be supplied as an oil containing CBD, capsules, dried cannabis, or as a liquid solution. CBD is thought not to be as psychoactive as THC (delta-9-tetrahydrocannabinol - the chemical in marijuana responsible for the "HIGH"). Studies suggest that CBD may interact with different biological target receptors in the body, including cannabinoid and other neurotransmitter receptors. As of 2018 the mechanism of action for its biological effects has not been determined.  Side-effects  Adverse reactions: Dry mouth, diarrhea, decreased appetite, fatigue, drowsiness, malaise, weakness, sleep disturbances, and others.  Drug interactions: CBC may interact with other medications  such as blood-thinners. (Last update: 09/18/2019) ____________________________________________________________________________________________  ____________________________________________________________________________________________  Drug Holidays (Slow)  What is a "Drug Holiday"? Drug Holiday: is the name given to the period of time during which a patient stops taking a medication(s) for the purpose of eliminating tolerance to the drug.  Benefits Improved effectiveness of opioids. Decreased opioid dose needed to achieve benefits. Improved pain with lesser dose.    What is tolerance? Tolerance: is the progressive decreased in effectiveness of a drug due to its repetitive use. With repetitive use, the body gets use to the medication and as a consequence, it loses its effectiveness. This is a common problem seen with opioid pain medications. As a result, a larger dose of the drug is needed to achieve the same effect that used to be obtained with a smaller dose.  How long should a "Drug Holiday" last? You should stay off of the pain medicine for at least 14 consecutive days. (2 weeks)  Should I stop the medicine "cold turkey"? No. You should always coordinate with your Pain Specialist so that he/she can provide you with the correct medication dose to make the transition as smoothly as possible.  How do I stop the medicine? Slowly. You will be instructed to decrease the daily amount of pills that you take by one (1) pill every seven (7) days. This is called a "slow downward taper" of your dose. For example: if you normally take four (4) pills per day, you will be asked to drop this dose to three (3) pills per day for seven (7) days, then to two (2) pills per day for seven (7) days, then to one (1) per day for seven (7) days, and at the end of those last seven (7) days, this is when the "Drug Holiday" would start.   Will I have withdrawals? By doing a "slow downward taper" like this one, it  is unlikely that you will experience any significant withdrawal symptoms. Typically, what triggers withdrawals is the sudden stop of a high dose opioid therapy. Withdrawals can usually be avoided by slowly decreasing the dose over a prolonged period of time. If you do not follow these instructions and decide to stop your medication abruptly, withdrawals may be possible.  What are withdrawals? Withdrawals: refers to the wide range of symptoms that occur after stopping or dramatically reducing opiate drugs after heavy and prolonged use. Withdrawal symptoms do not occur to patients that use low dose opioids, or those who take the medication sporadically. Contrary to benzodiazepine (example: Valium, Xanax, etc.) or alcohol withdrawals ("Delirium Tremens"), opioid withdrawals are not lethal. Withdrawals are the physical manifestation of the body getting rid of the excess receptors.  Expected Symptoms Early symptoms of withdrawal may include: Agitation Anxiety Muscle aches Increased tearing Insomnia Runny nose Sweating Yawning  Late symptoms of withdrawal may include: Abdominal cramping Diarrhea Dilated pupils Goose bumps Nausea Vomiting  Will I experience withdrawals? Due to the slow nature of the taper, it is very unlikely that you will experience any.  What is a slow taper? Taper: refers to the gradual decrease in dose.  (Last update: 09/01/2019) ____________________________________________________________________________________________    

## 2020-07-31 NOTE — Progress Notes (Signed)
Nursing Pain Medication Assessment:  Safety precautions to be maintained throughout the outpatient stay will include: orient to surroundings, keep bed in low position, maintain call bell within reach at all times, provide assistance with transfer out of bed and ambulation.  Medication Inspection Compliance: Pill count conducted under aseptic conditions, in front of the patient. Neither the pills nor the bottle was removed from the patient's sight at any time. Once count was completed pills were immediately returned to the patient in their original bottle.  Medication: Oxycodone IR Pill/Patch Count:  62 of 120 pills remain Pill/Patch Appearance: Markings consistent with prescribed medication Bottle Appearance: Standard pharmacy container. Clearly labeled. Filled Date: 05 / 28 / 2022 Last Medication intake:  Today

## 2020-08-01 ENCOUNTER — Ambulatory Visit (INDEPENDENT_AMBULATORY_CARE_PROVIDER_SITE_OTHER): Payer: 59 | Admitting: Physical Therapy

## 2020-08-01 DIAGNOSIS — M25551 Pain in right hip: Secondary | ICD-10-CM

## 2020-08-01 DIAGNOSIS — M6281 Muscle weakness (generalized): Secondary | ICD-10-CM | POA: Diagnosis not present

## 2020-08-01 DIAGNOSIS — R29898 Other symptoms and signs involving the musculoskeletal system: Secondary | ICD-10-CM | POA: Diagnosis not present

## 2020-08-01 NOTE — Therapy (Signed)
[place], [place], [address] Phone: [phone]   Fax:  [phone]  Physical Therapy Treatment  Patient Details  Name: Mary Cruz MRN: [id number] Date of Birth: [birth year] Referring Provider (PT): Samuel Bouche, MD   Encounter Date: 08/01/2020   PT End of Session - 08/01/20 1024     Visit Number 3    Number of Visits 12    Date for PT Re-Evaluation 08/24/20    Authorization Type [employer]    PT Start Time 1019    PT Stop Time 1105    PT Time Calculation (min) 46 min    Activity Tolerance Patient tolerated treatment well    Behavior During Therapy [facility] for tasks assessed/performed             Past Medical History:  Diagnosis Date   Abscess of axillary fold 10/10/2013   Acute bronchitis with asthma with acute exacerbation 01/23/2015   Dehydration 04/19/2019   Elevated lactic acid level 04/19/2019   Encounter for management of implanted device 12/19/2014   Encounter for therapeutic drug level monitoring 12/19/2014   Fibromyalgia    Hyperglycemia 04/19/2019   Hypertension    Implant pain at battery site (right buttocks area) 04/05/2015   KIDNEY STONES 07/02/2007   Qualifier: History of  By: Bobby Rumpf CMA (AAMA), Patty     Long term current use of opiate analgesic 12/19/2014   Lupus ([place])    Morbid obesity ([place]) 12/19/2014   Muscle cramping 12/19/2014   Opiate use (30 MME/Day) 12/19/2014   Osteoarthritis    Pain    chronic regional pain syndrome   Right knee pain 01/10/2015   Sepsis ([place]) 04/19/2019   Sepsis affecting skin 10/10/2013   Tobacco abuse 04/19/2019    Past Surgical History:  Procedure Laterality Date   BREAST SURGERY     CHOLECYSTECTOMY  2000   ENDOMETRIAL ABLATION     KNEE SURGERY     KNEE SURGERY Right 02-2015   LAPAROSCOPIC APPENDECTOMY Right 01/01/2020   Procedure: APPENDECTOMY LAPAROSCOPIC;  Surgeon: Rolm Bookbinder, MD;  Location: [facility];  Service: General;  Laterality: Right;    OCCIPITAL NERVE STIMULATOR INSERTION     SHOULDER ARTHROSCOPY WITH DISTAL CLAVICLE RESECTION Right 10/29/2018   Procedure: SHOULDER ARTHROSCOPY WITH DISTAL CLAVICLE RESECTION;  Surgeon: Hiram Gash, MD;  Location: [place];  Service: Orthopedics;  Laterality: Right;   SHOULDER SURGERY     spinal cord stimulator     SPINAL CORD STIMULATOR IMPLANT  2008   SPINAL CORD STIMULATOR INSERTION N/A 06/21/2016   Procedure: LUMBAR SPINAL CORD STIMULATOR INSERTION;  Surgeon: Clydell Hakim, MD;  Location: [place];  Service: Neurosurgery;  Laterality: N/A;  LUMBAR SPINAL CORD STIMULATOR INSERTION   SUBACROMIAL DECOMPRESSION Right 10/29/2018   Procedure: SUBACROMIAL DECOMPRESSION;  Surgeon: Hiram Gash, MD;  Location: [place];  Service: Orthopedics;  Laterality: Right;   TUBAL LIGATION  2000    There were no vitals filed for this visit.   Subjective Assessment - 08/01/20 1021     Subjective Pt reports that the tape helped her hip.  She left it on for 1 wk; would like to be retaped.  She was sick for the last week so didn't do much of her exercises.  Spent most of the week in bed or on couch.    Pertinent History spinal DDD, has pain mgmt doctor for back and neck;  HTN    Currently in Pain?  Yes    Pain Score 3     Pain Location Hip    Pain Orientation Right;Lateral    Pain Descriptors / Indicators Aching    Aggravating Factors  bending, walking    Pain Relieving Factors rest, tape.                [facility] PT Assessment - 08/01/20 0001       Assessment   Medical Diagnosis R hip pain    Referring Provider (PT) Samuel Bouche, MD    Onset Date/Surgical Date 05/06/20    Hand Dominance Right    Prior Therapy has tried PT in past ofr h/o chronic pain              [facility] Adult PT Treatment/Exercise - 08/01/20 0001       Knee/Hip Exercises: Stretches   Hip Flexor Stretch Right;Left;2 reps;20 seconds   seated in chair   Hip Flexor Stretch Limitations 2nd set -  RLE off edge of table with towel assist of Lt kne towards chest to stabilize pelvis x 20 sec x 2 reps (preferred stretch)    Piriformis Stretch Right;Left;20 seconds;2 reps   modified pigeon pose   Other Knee/Hip Stretches wide leg LTR x 10 sec x 5 reps each side.    Other Knee/Hip Stretches supine butterfly x 30 sec x 2.      Knee/Hip Exercises: Aerobic   Nustep L4: arms/legs x 6.5 min    Other Aerobic single laps in gym to assess response to exercise      Knee/Hip Exercises: Seated   Other Seated Knee/Hip Exercises sit to/from stand x 7 reps with cues for form and core engaged.      Knee/Hip Exercises: Supine   Bridges 1 set;5 reps   cues for TA and glute set, reduced pain     Knee/Hip Exercises: Sidelying   Hip ABduction Right;1 set;10 reps    Hip ABduction Limitations limited available active range; therapist assist to full range and pt controlled lowering independently.    Clams RLE x 10, reverse clam x 10      Manual Therapy   Manual Therapy Taping;Soft tissue mobilization    Manual therapy comments TPR to Rt glute med/ piriformis with active/passive ER/IR of LE.    [place] shape of reg [place] tape applied to Rt lateral hip to decompress and assist with edema reduction and increase proprioception                    PT Education - 08/01/20 1122     Education Details pool instructions, added hip flexor stretch to HEP    Person(s) Educated Patient    Methods Explanation;Handout    Comprehension Verbalized understanding                 PT Long Term Goals - 07/13/20 1241       PT LONG TERM GOAL #1   Title The patient will be indep with HEP for self soft tissue mobilization, strengthening, flexibility.    Time 6    Period Weeks    Target Date 08/24/20      PT LONG TERM GOAL #2   Title The patient will improve pain from 5/10 at baseline to < or equal to 3/10    Time 6    Period Weeks    Target Date  08/24/20      PT  LONG TERM GOAL #3   Title The patient will improve functional status score from 29% to >or equal ot 56%.    Time 6    Period Weeks    Target Date 08/24/20      PT LONG TERM GOAL #4   Title The patient will improve R LE strength to 4/5 for hip flexion, abduction, knee flexion/extension and ankle DF.    Time 6    Period Weeks    Target Date 08/24/20                   Plan - 08/01/20 1111     Clinical Impression Statement Positive response to ktape application to hip. Pt tolerated thomas position for hip flexor stretch better than seated; seated position increased Lt LBP with Rt hip extended.  Rt hip abduction continues to be weak; required assistance in sidelying position and fatigued quickly.  Progressing towards goals.    Personal Factors and Comorbidities Comorbidity 2;Fitness;Past/Current Experience    Comorbidities HTN, chronic neck and back pain with prolonged use of pain meds    Examination-Activity Limitations Sleep;Squat;Stairs;Stand;Lift;Bend;Locomotion Level    Examination-Participation Restrictions Cleaning;Meal Prep;Community Activity;Laundry    Stability/Clinical Decision Making Stable/Uncomplicated    Rehab Potential Good    PT Frequency 2x / week    PT Duration 6 weeks    PT Treatment/Interventions Taping;Patient/family education;ADLs/Self Care Home Management;Therapeutic activities;Therapeutic exercise;Electrical Stimulation;Moist Heat;Aquatic Therapy;Dry needling;Manual techniques;Gait training;Stair training    PT Next Visit Plan Work on [employer] and R Sacral mobilization, LE strengthening to tolerance, stretching and STM    PT Home Exercise Plan Access Code: [email or web address]    Consulted and Agree with Plan of Care Patient             Patient will benefit from skilled therapeutic intervention in order to improve the following deficits and impairments:  Pain, Hypomobility, Impaired flexibility, Postural dysfunction, Decreased strength, Increased  fascial restricitons, Decreased range of motion, Decreased activity tolerance  Visit Diagnosis: Pain in right hip  Muscle weakness (generalized)  Other symptoms and signs involving the musculoskeletal system     Problem List Patient Active Problem List   Diagnosis Date Noted   Chronic use of opiate for therapeutic purpose 06/27/2020   Closed fracture of multiple ribs of right side 05/08/2020   Contusion of both lungs 05/08/2020   Pneumothorax, right 05/08/2020   Fall from horse 05/06/2020   Interstitial cystitis 05/04/2020   Vaginal candidiasis 05/04/2020   Intractable low back pain 04/25/2020   Appendicitis 01/01/2020   Chronic low back pain (Right) w/ sciatica (Right) 06/21/2019   Chronic lower extremity pain (Right) 06/21/2019   Lumbosacral radiculopathy at L5 (Right) 06/21/2019   Dropfoot (Right) 06/21/2019   Lumbar facet arthropathy 06/21/2019   Colitis 04/19/2019   Osteoarthritis involving multiple joints 04/08/2019   Latex precautions, history of latex allergy 04/08/2019   Spondylosis without myelopathy or radiculopathy, lumbosacral region 02/25/2019   DDD (degenerative disc disease), lumbar 02/25/2019   DDD (degenerative disc disease), cervical 11/15/2018   Cervicalgia 11/15/2018   Abnormal MRI, shoulder (Right) 11/15/2018   Chronic upper extremity pain (2ry area of Pain) (Bilateral) (L>R) 08/20/2018   Chronic hip pain (3ry area of Pain) (Bilateral) (R>L) 08/20/2018   Closed fracture of scapula, sequela (Right) 08/20/2018   Pharmacologic therapy 08/20/2018   Disorder of skeletal system 08/20/2018   Problems influencing health status 08/20/2018   Pain in right knee 03/19/2017   Family history of thyroid disease 10/24/2016   Neurogenic pain  10/10/2016   Fibromyalgia 10/10/2016   Spinal cord stimulator status 05/16/2016   Battery end of life of spinal cord stimulator 05/16/2016   Positive ANA (antinuclear antibody) 09/13/2015   Lumbar spondylosis 06/29/2015    Lumbar facet syndrome (Bilateral) (R>L) 06/29/2015   Anxiety and depression 01/20/2015   Long term prescription opiate use 16/11/9602   Uncomplicated opioid dependence ([place]) 12/19/2014   Chronic musculoskeletal pain 12/19/2014   Osteoarthrosis 12/19/2014   Vitamin D insufficiency 12/19/2014   Presence of functional implant ([employer]tronic Lumbar spinal cord stimulator implant) 12/19/2014   Chronic low back pain (Bilateral) (R>L) w/o sciatica 12/19/2014   Chronic lumbar radicular pain (Right L5 dermatome; Left S1 Dermatome) (Bilateral) (R>L) 12/19/2014   Chronic neck pain (1ry area of Pain) (Bilateral) (L>R) 12/19/2014   Chronic cervical radicular pain (C7 Dermatome) (Bilateral) (L>R) 12/19/2014   Chronic lower extremity pain (Bilateral) (R>L) 12/19/2014   Severe obesity with body mass index (BMI) of 35.0 to 39.9 with comorbidity ([place]) 10/27/2014   Generalized anxiety disorder 03/16/2014   Essential hypertension, benign 07/09/2013   Chronic pain syndrome 07/09/2013   GERD 07/02/2007   Irritable bowel syndrome 07/02/2007    Kerin Perna, PTA 08/01/20 11:22 AM   [place], [place], [address] Phone: [phone]   Fax:  [phone]  Name: Mary Cruz MRN: [id number] Date of Birth: [birth year]

## 2020-08-01 NOTE — Patient Instructions (Signed)
   Aquatic Therapy: What to Expect!  Where:  [facility] [address] [place], [place] [address] [phone]           How to Prepare: Please make sure you drink 8 ounces of water about one hour prior to your pool session A caregiver must attend the entire session with the patient (unless your primary therapists feels this is not necessary). The caregiver will be responsible for assisting with dressing as well as any toileting needs.  Please arrive IN YOUR SUIT and a few minutes prior to your appointment - this helps to avoid delays in starting your session. Please make sure to attend to any toileting needs prior to entering the pool. Once on the pool deck your therapist will ask you to sign the Patient  Consent and Assignment of Benefits form. Your therapist may take your blood pressure prior to, during and after your session if indicated. We usually try and create a home exercise program based on activities we do in the pool.  Please be thinking about who might be able to assist you in the pool should you want to participate in an aquatic home exercise program at the time of discharge.  Some patients do not want to or do not have the ability to participate in an aquatic home program - this is not a barrier in any way to you participating in aquatic therapy as part of your current therapy plan!  Appointments:  All sessions are 45 minutes  About the pool: Entering the pool Your therapist will assist you; there are two ways to enter the pool - stairs or a mechanical lift. Your therapist will determine the most appropriate way for you. Water temperature is usually around 90-91.  There is a lap pool with a temperature around 84 There may be other swimmers in the pool at the same time.   Contact Info:             To cancel appointment, please call [facility] at [phone] If you are running late, please call [facility] at  [phone]

## 2020-08-02 NOTE — Progress Notes (Signed)
Subjective:    CC: right hip/thigh pain and numbness  HPI: Pleasant 45 year old female presenting today for the following:  Right hip/thigh pain and numbness: She has been going to physical therapy as instructed and reports this is helping quite a bit.  She starts water therapy tomorrow and is looking forward to that since this will likely provide further relief.  She is still followed by pain management and is taking her medications as prescribed.  Mood-she does have quite a bit of anxiety and depression and her scores are PHQ-9/GAD-7 reflect this.  She is currently taking amitriptyline 100 mg at night to sleep but recently realized that she has not been on her Cymbalta since November 2021.  She had a lapse in insurance at that time and never got restarted on the medication when her insurance came back.  Previously took Cymbalta 60 mg twice daily, tolerated well without side effects.  She did take this in conjunction with amitriptyline and did not have any issues.  Would like to get restarted back on this today as it may help with body aches but may also help with her mood.  Weight loss: She is very interested and efforts to augment her weight loss.  She has lost 25 pounds to date.  She is cut back on fast food and is working to eat more vegetables and make healthier choices in her diet.  Her pain limits her ability to exercise at this point and she now has hit a plateau.  She has a relative who told her about [facility] and she is very interested in that.  She does have morbid obesity with comorbid conditions including prediabetes and hypertension.  Has not had labs checked in a while so is wanting to have those done today.  I reviewed the past medical history, family history, social history, surgical history, and allergies today and no changes were needed.  Please see the problem list section below in epic for further details.  Past Medical History: Past Medical History:  Diagnosis Date   Abscess  of axillary fold 10/10/2013   Acute bronchitis with asthma with acute exacerbation 01/23/2015   Dehydration 04/19/2019   Elevated lactic acid level 04/19/2019   Encounter for management of implanted device 12/19/2014   Encounter for therapeutic drug level monitoring 12/19/2014   Fibromyalgia    Hyperglycemia 04/19/2019   Hypertension    Implant pain at battery site (right buttocks area) 04/05/2015   KIDNEY STONES 07/02/2007   Qualifier: History of  By: Bobby Rumpf CMA (AAMA), Patty     Long term current use of opiate analgesic 12/19/2014   Lupus ([place])    Morbid obesity ([place]) 12/19/2014   Muscle cramping 12/19/2014   Opiate use (30 MME/Day) 12/19/2014   Osteoarthritis    Pain    chronic regional pain syndrome   Right knee pain 01/10/2015   Sepsis ([place]) 04/19/2019   Sepsis affecting skin 10/10/2013   Tobacco abuse 04/19/2019   Past Surgical History: Past Surgical History:  Procedure Laterality Date   BREAST SURGERY     CHOLECYSTECTOMY  2000   ENDOMETRIAL ABLATION     KNEE SURGERY     KNEE SURGERY Right 02-2015   LAPAROSCOPIC APPENDECTOMY Right 01/01/2020   Procedure: APPENDECTOMY LAPAROSCOPIC;  Surgeon: Rolm Bookbinder, MD;  Location[facility];  Service: General;  Laterality: Right;   OCCIPITAL NERVE STIMULATOR INSERTION     SHOULDER ARTHROSCOPY WITH DISTAL CLAVICLE RESECTION Right 10/29/2018   Procedure: SHOULDER ARTHROSCOPY WITH DISTAL CLAVICLE RESECTION;  Surgeon: Hiram Gash, MD;  Location: [facility];  Service: Orthopedics;  Laterality: Right;   SHOULDER SURGERY     spinal cord stimulator     SPINAL CORD STIMULATOR IMPLANT  2008   SPINAL CORD STIMULATOR INSERTION N/A 06/21/2016   Procedure: LUMBAR SPINAL CORD STIMULATOR INSERTION;  Surgeon: Clydell Hakim, MD;  Location: [facility];  Service: Neurosurgery;  Laterality: N/A;  LUMBAR SPINAL CORD STIMULATOR INSERTION   SUBACROMIAL DECOMPRESSION Right 10/29/2018   Procedure: SUBACROMIAL DECOMPRESSION;  Surgeon: Hiram Gash, MD;  Location:  [facility];  Service: Orthopedics;  Laterality: Right;   TUBAL LIGATION  2000   Social History: Social History   Socioeconomic History   Marital status: Married    Spouse name: Not on file   Number of children: Not on file   Years of education: Not on file   Highest education level: Not on file  Occupational History   Not on file  Tobacco Use   Smoking status: Former    Pack years: 0.00    Types: Cigarettes   Smokeless tobacco: Former    Quit date: 12/22/2017  Vaping Use   Vaping Use: Former  Substance and Sexual Activity   Alcohol use: No    Alcohol/week: 0.0 standard drinks   Drug use: No   Sexual activity: Yes    Partners: Male    Birth control/protection: Surgical  Other Topics Concern   Not on file  Social History Narrative   Not on file   Social Determinants of Health   Financial Resource Strain: Not on file  Food Insecurity: Not on file  Transportation Needs: Not on file  Physical Activity: Not on file  Stress: Not on file  Social Connections: Not on file   Family History: Family History  Problem Relation Age of Onset   Depression Mother        maternal grandmother   Asthma Mother    Fibromyalgia Other        aunt   Stroke Other        grandmother   Thyroid disease Other        grandmother   Allergies: Allergies  Allergen Reactions   Celecoxib Swelling    "Body swelling"   Doxycycline Other (See Comments) and Nausea And Vomiting    "SICKNESS"   Gabapentin Swelling    Whole body swelling   Latex Itching and Swelling   Nabumetone Itching   Pentazocine Lactate Itching and Other (See Comments)    HALLUCINATIONS   Seroquel [Quetiapine] Other (See Comments)    Falls   Amoxicillin Diarrhea and Nausea And Vomiting   Bactrim [Sulfamethoxazole-Trimethoprim] Other (See Comments)    "Makes very sick"   Cephalexin Other (See Comments) and Hives    Nausea and causes uti's   Erythromycin Other (See Comments)    RESULTANT UTI    Hydromorphone Hcl Other (See Comments)    HALLUCINATIONS   Lyrica [Pregabalin] Other (See Comments)    "Too sleepy"   Medications: See med rec.  Review of Systems: See HPI for pertinent positives and negatives.   Objective:    General: Well Developed, well nourished, and in no acute distress.  Neuro: Alert and oriented x3.  HEENT: Normocephalic, atraumatic.  Skin: Warm and dry. Cardiac: Regular rate and rhythm, no murmurs rubs or gallops, no lower extremity edema.  Respiratory: Clear to auscultation bilaterally. Not using accessory muscles, speaking in full sentences.  Impression and Recommendations:    1. Right  hip pain Continue medications per pain management.  Restarting Cymbalta which may help.  Continue with physical therapy and plan for aquatic therapy  2. Essential hypertension, benign Blood pressure at goal today.  Checking CBC with differential, lipids, and CMP today. - CBC w/Diff/Platelet - Lipid panel - COMPLETE METABOLIC PANEL WITH GFR  3. Prediabetes Last hemoglobin A1c at 5.7% indicating prediabetes.  Rechecking today. - Hemoglobin A1c  4. Anxiety and depression Restarting Cymbalta 60 mg daily for the first 3 to 4 days and then increase to 60 mg twice daily.  If having difficulty tolerating this, reduce dose to 60 mg for a couple of weeks and then try increasing the dose again  5. Weight loss counseling, encounter for Discussed options for weight loss.  Wegovy would be the best option for her.  If lab results show hyperlipidemia, she meets the diagnosis requirement for metabolic syndrome which may help with approval.  If unable to get [facility] approved, consider Saxenda as this may provide similar benefit.  Return in about 4 weeks (around 08/31/2020) for mood follow up. ___________________________________________ Clearnce Sorrel, DNP, APRN, FNP-BC Primary Care and [place]

## 2020-08-03 ENCOUNTER — Ambulatory Visit: Payer: 59 | Admitting: Medical-Surgical

## 2020-08-03 ENCOUNTER — Encounter: Payer: Self-pay | Admitting: Medical-Surgical

## 2020-08-03 ENCOUNTER — Other Ambulatory Visit: Payer: Self-pay

## 2020-08-03 VITALS — BP 110/72 | HR 100 | Temp 98.3°F | Ht 64.0 in | Wt 254.9 lb

## 2020-08-03 DIAGNOSIS — F32A Depression, unspecified: Secondary | ICD-10-CM

## 2020-08-03 DIAGNOSIS — Z713 Dietary counseling and surveillance: Secondary | ICD-10-CM

## 2020-08-03 DIAGNOSIS — I10 Essential (primary) hypertension: Secondary | ICD-10-CM | POA: Diagnosis not present

## 2020-08-03 DIAGNOSIS — E8881 Metabolic syndrome: Secondary | ICD-10-CM

## 2020-08-03 DIAGNOSIS — M25551 Pain in right hip: Secondary | ICD-10-CM

## 2020-08-03 DIAGNOSIS — R7303 Prediabetes: Secondary | ICD-10-CM

## 2020-08-03 DIAGNOSIS — F419 Anxiety disorder, unspecified: Secondary | ICD-10-CM

## 2020-08-03 DIAGNOSIS — E782 Mixed hyperlipidemia: Secondary | ICD-10-CM

## 2020-08-03 MED ORDER — DULOXETINE HCL 60 MG PO CPEP: 60.0000 mg | ORAL_CAPSULE | Freq: Two times a day (BID) | ORAL | 3 refills | Status: DC

## 2020-08-04 ENCOUNTER — Ambulatory Visit (INDEPENDENT_AMBULATORY_CARE_PROVIDER_SITE_OTHER): Payer: 59 | Admitting: Physical Therapy

## 2020-08-04 ENCOUNTER — Encounter: Payer: Self-pay | Admitting: Medical-Surgical

## 2020-08-04 DIAGNOSIS — E8881 Metabolic syndrome: Secondary | ICD-10-CM | POA: Insufficient documentation

## 2020-08-04 DIAGNOSIS — M25551 Pain in right hip: Secondary | ICD-10-CM

## 2020-08-04 DIAGNOSIS — M6281 Muscle weakness (generalized): Secondary | ICD-10-CM | POA: Diagnosis not present

## 2020-08-04 DIAGNOSIS — R768 Other specified abnormal immunological findings in serum: Secondary | ICD-10-CM

## 2020-08-04 DIAGNOSIS — E782 Mixed hyperlipidemia: Secondary | ICD-10-CM

## 2020-08-04 DIAGNOSIS — R29898 Other symptoms and signs involving the musculoskeletal system: Secondary | ICD-10-CM | POA: Diagnosis not present

## 2020-08-04 DIAGNOSIS — M797 Fibromyalgia: Secondary | ICD-10-CM

## 2020-08-04 DIAGNOSIS — R21 Rash and other nonspecific skin eruption: Secondary | ICD-10-CM

## 2020-08-04 HISTORY — DX: Mixed hyperlipidemia: E78.2

## 2020-08-04 HISTORY — DX: Metabolic syndrome: E88.810

## 2020-08-04 LAB — COMPLETE METABOLIC PANEL WITH GFR
AG Ratio: 1.3 (calc) (ref 1.0–2.5)
ALT: 17 U/L (ref 6–29)
AST: 18 U/L (ref 10–30)
Albumin: 4.2 g/dL (ref 3.6–5.1)
Alkaline phosphatase (APISO): 106 U/L (ref 31–125)
BUN: 10 mg/dL (ref 7–25)
CO2: 30 mmol/L (ref 20–32)
Calcium: 9.8 mg/dL (ref 8.6–10.2)
Chloride: 100 mmol/L (ref 98–110)
Creat: 0.95 mg/dL (ref 0.50–1.10)
GFR, Est African American: 84 mL/min/{1.73_m2} (ref >=60)
GFR, Est Non African American: 73 mL/min/{1.73_m2} (ref >=60)
Globulin: 3.2 g/dL (calc) (ref 1.9–3.7)
Glucose, Bld: 100 mg/dL — ABNORMAL HIGH (ref 65–99)
Potassium: 3.8 mmol/L (ref 3.5–5.3)
Sodium: 139 mmol/L (ref 135–146)
Total Bilirubin: 0.4 mg/dL (ref 0.2–1.2)
Total Protein: 7.4 g/dL (ref 6.1–8.1)

## 2020-08-04 LAB — CBC WITH DIFFERENTIAL/PLATELET
Absolute Monocytes: 756 cells/uL (ref 200–950)
Basophils Absolute: 7 cells/uL (ref 0–200)
Basophils Relative: 0.1 %
Eosinophils Absolute: 108 cells/uL (ref 15–500)
Eosinophils Relative: 1.5 %
HCT: 40.5 % (ref 35.0–45.0)
Hemoglobin: 13.2 g/dL (ref 11.7–15.5)
Lymphs Abs: 1858 cells/uL (ref 850–3900)
MCH: 28.2 pg (ref 27.0–33.0)
MCHC: 32.6 g/dL (ref 32.0–36.0)
MCV: 86.5 fL (ref 80.0–100.0)
MPV: 11.7 fL (ref 7.5–12.5)
Monocytes Relative: 10.5 %
Neutro Abs: 4471 cells/uL (ref 1500–7800)
Neutrophils Relative %: 62.1 %
Platelets: 334 10*3/uL (ref 140–400)
RBC: 4.68 10*6/uL (ref 3.80–5.10)
RDW: 13.4 % (ref 11.0–15.0)
Total Lymphocyte: 25.8 %
WBC: 7.2 10*3/uL (ref 3.8–10.8)

## 2020-08-04 LAB — LIPID PANEL
Cholesterol: 205 mg/dL — ABNORMAL HIGH (ref <200)
HDL: 44 mg/dL — ABNORMAL LOW (ref >=50)
LDL Cholesterol (Calc): 134 mg/dL (calc) — ABNORMAL HIGH
Non-HDL Cholesterol (Calc): 161 mg/dL (calc) — ABNORMAL HIGH (ref <130)
Total CHOL/HDL Ratio: 4.7 (calc) (ref <5.0)
Triglycerides: 143 mg/dL (ref <150)

## 2020-08-04 LAB — HEMOGLOBIN A1C
Hgb A1c MFr Bld: 5.9 % of total Hgb — ABNORMAL HIGH (ref <5.7)
Mean Plasma Glucose: 123 mg/dL
eAG (mmol/L): 6.8 mmol/L

## 2020-08-04 MED ORDER — WEGOVY 0.25 MG/0.5ML ~~LOC~~ SOAJ: 0.2500 mg | SUBCUTANEOUS | 0 refills | Status: DC

## 2020-08-04 NOTE — Therapy (Signed)
[facility] [place], [place], [address] Phone: [phone]   Fax:  [phone]  Physical Therapy Treatment  Patient Details  Name: Mary Cruz MRN: [id number] Date of Birth: 1975-05-09 Referring Provider (PT): Samuel Bouche, MD   Encounter Date: 08/04/2020   PT End of Session - 08/04/20 1551     Visit Number 4    Number of Visits 12    Date for PT Re-Evaluation 08/24/20    Authorization Type UHC    PT Start Time 0814    PT Stop Time 1609    PT Time Calculation (min) 38 min    Activity Tolerance Patient tolerated treatment well    Behavior During Therapy [facility] for tasks assessed/performed             Past Medical History:  Diagnosis Date   Abscess of axillary fold 10/10/2013   Acute bronchitis with asthma with acute exacerbation 01/23/2015   Dehydration 04/19/2019   Elevated lactic acid level 04/19/2019   Encounter for management of implanted device 12/19/2014   Encounter for therapeutic drug level monitoring 12/19/2014   Fibromyalgia    Hyperglycemia 04/19/2019   Hypertension    Implant pain at battery site (right buttocks area) 04/05/2015   KIDNEY STONES 07/02/2007   Qualifier: History of  By: Bobby Rumpf CMA (AAMA), Patty     Long term current use of opiate analgesic 12/19/2014   Lupus ([place])    Morbid obesity ([place]) 12/19/2014   Muscle cramping 12/19/2014   Opiate use (30 MME/Day) 12/19/2014   Osteoarthritis    Pain    chronic regional pain syndrome   Right knee pain 01/10/2015   Sepsis ([place]) 04/19/2019   Sepsis affecting skin 10/10/2013   Tobacco abuse 04/19/2019    Past Surgical History:  Procedure Laterality Date   BREAST SURGERY     CHOLECYSTECTOMY  2000   ENDOMETRIAL ABLATION     KNEE SURGERY     KNEE SURGERY Right 02-2015   LAPAROSCOPIC APPENDECTOMY Right 01/01/2020   Procedure: APPENDECTOMY LAPAROSCOPIC;  Surgeon: Rolm Bookbinder, MD;  Location: [facility];  Service: General;  Laterality: Right;    OCCIPITAL NERVE STIMULATOR INSERTION     SHOULDER ARTHROSCOPY WITH DISTAL CLAVICLE RESECTION Right 10/29/2018   Procedure: SHOULDER ARTHROSCOPY WITH DISTAL CLAVICLE RESECTION;  Surgeon: Hiram Gash, MD;  Location: [place];  Service: Orthopedics;  Laterality: Right;   SHOULDER SURGERY     spinal cord stimulator     SPINAL CORD STIMULATOR IMPLANT  2008   SPINAL CORD STIMULATOR INSERTION N/A 06/21/2016   Procedure: LUMBAR SPINAL CORD STIMULATOR INSERTION;  Surgeon: Clydell Hakim, MD;  Location: [place];  Service: Neurosurgery;  Laterality: N/A;  LUMBAR SPINAL CORD STIMULATOR INSERTION   SUBACROMIAL DECOMPRESSION Right 10/29/2018   Procedure: SUBACROMIAL DECOMPRESSION;  Surgeon: Hiram Gash, MD;  Location: [place];  Service: Orthopedics;  Laterality: Right;   TUBAL LIGATION  2000    There were no vitals filed for this visit.   Subjective Assessment - 08/04/20 1535     Subjective Pt reports that the [facility] stretch (with towel assist for holding opp leg) is really helped release back. Stretches are getting a little easier.  Tape continues to give relief. Took  tape off last night. Rt hip pain continues to limit her sleep.    Patient Stated Goals Be able to walk without pain    Currently in Pain? Yes    Pain Score 4  Pain Location Hip    Pain Orientation Right;Lateral    Pain Descriptors / Indicators Aching    Pain Radiating Towards into low back    Aggravating Factors  bending, walking    Pain Relieving Factors rest, tape.            Pt seen for aquatic therapy today.  Treatment took place in water 3.25-4 ft in depth at the [employer] pool. Temp of water was 91.  Pt entered/exited the pool via stairs independently with bilat rail.  Treatment:   Gait: forward, backward, and side stepping (without floatation support) Cues for full stride with RLE (initially step to pattern)  Without support:  Side step squat Lt/Rt.  High knee  marching. Light forward jog (slow)  With UE support on pool edge:   Heel raises x 10, hip ext x 10 each leg, hip abd x 10 each leg, Hip circles CW x 10 each leg  Sit to/from stand at bench x 10  Seated modified pigeon pose on bench x 20 sec x 2 reps RLE, LLE.   Single leg long sitting hamstring stretch x 20 sec x 2 reps each leg. Low back stretch with feet on wall, holding onto ledge x 10 sec x 2 reps   Pt requires buoyancy for support and to offload joints with strengthening exercises. Viscosity of the water is needed for resistance of strengthening; water current perturbations provides challenge to standing balance unsupported, requiring increased core activation.     PT Long Term Goals - 07/13/20 1241       PT LONG TERM GOAL #1   Title The patient will be indep with HEP for self soft tissue mobilization, strengthening, flexibility.    Time 6    Period Weeks    Target Date 08/24/20      PT LONG TERM GOAL #2   Title The patient will improve pain from 5/10 at baseline to < or equal to 3/10    Time 6    Period Weeks    Target Date 08/24/20      PT LONG TERM GOAL #3   Title The patient will improve functional status score from 29% to >or equal ot 56%.    Time 6    Period Weeks    Target Date 08/24/20      PT LONG TERM GOAL #4   Title The patient will improve R LE strength to 4/5 for hip flexion, abduction, knee flexion/extension and ankle DF.    Time 6    Period Weeks    Target Date 08/24/20                   Plan - 08/04/20 1552     Clinical Impression Statement Pt reported elimination of pain in Rt hip with exercises in water chest deep. She tolerated all exercises well without limitations. Upon exiting water, pain was still at a 0/10.  Progressing towards goals.    Personal Factors and Comorbidities Comorbidity 2;Fitness;Past/Current Experience    Comorbidities HTN, chronic neck and back pain with prolonged use of pain meds    Examination-Activity Limitations  Sleep;Squat;Stairs;Stand;Lift;Bend;Locomotion Level    Examination-Participation Restrictions Cleaning;Meal Prep;Community Activity;Laundry    Stability/Clinical Decision Making Stable/Uncomplicated    Rehab Potential Good    PT Frequency 2x / week    PT Duration 6 weeks    PT Treatment/Interventions Taping;Patient/family education;ADLs/Self Care Home Management;Therapeutic activities;Therapeutic exercise;Electrical Stimulation;Moist Heat;Aquatic Therapy;Dry needling;Manual techniques;Gait training;Stair training    PT Next  Visit Plan continue LE strengthening to tolerance, stretching and STM. establish HEP.    PT Home Exercise Plan Access Code: [email or web address]    [email or web address] and Agree with Plan of Care Patient             Patient will benefit from skilled therapeutic intervention in order to improve the following deficits and impairments:  Pain, Hypomobility, Impaired flexibility, Postural dysfunction, Decreased strength, Increased fascial restricitons, Decreased range of motion, Decreased activity tolerance  Visit Diagnosis: Pain in right hip  Muscle weakness (generalized)  Other symptoms and signs involving the musculoskeletal system     Problem List Patient Active Problem List   Diagnosis Date Noted   Metabolic syndrome 49/67/5916   Mixed hyperlipidemia 08/04/2020   Chronic use of opiate for therapeutic purpose 06/27/2020   Closed fracture of multiple ribs of right side 05/08/2020   Contusion of both lungs 05/08/2020   Pneumothorax, right 05/08/2020   Fall from horse 05/06/2020   Interstitial cystitis 05/04/2020   Vaginal candidiasis 05/04/2020   Intractable low back pain 04/25/2020   Appendicitis 01/01/2020   Chronic low back pain (Right) w/ sciatica (Right) 06/21/2019   Chronic lower extremity pain (Right) 06/21/2019   Lumbosacral radiculopathy at L5 (Right) 06/21/2019   Dropfoot (Right) 06/21/2019   Lumbar facet arthropathy 06/21/2019   Colitis 04/19/2019    Osteoarthritis involving multiple joints 04/08/2019   Latex precautions, history of latex allergy 04/08/2019   Spondylosis without myelopathy or radiculopathy, lumbosacral region 02/25/2019   DDD (degenerative disc disease), lumbar 02/25/2019   DDD (degenerative disc disease), cervical 11/15/2018   Cervicalgia 11/15/2018   Abnormal MRI, shoulder (Right) 11/15/2018   Chronic upper extremity pain (2ry area of Pain) (Bilateral) (L>R) 08/20/2018   Chronic hip pain (3ry area of Pain) (Bilateral) (R>L) 08/20/2018   Closed fracture of scapula, sequela (Right) 08/20/2018   Pharmacologic therapy 08/20/2018   Disorder of skeletal system 08/20/2018   Problems influencing health status 08/20/2018   Pain in right knee 03/19/2017   Family history of thyroid disease 10/24/2016   Neurogenic pain 10/10/2016   Fibromyalgia 10/10/2016   Spinal cord stimulator status 05/16/2016   Battery end of life of spinal cord stimulator 05/16/2016   Positive ANA (antinuclear antibody) 09/13/2015   Lumbar spondylosis 06/29/2015   Lumbar facet syndrome (Bilateral) (R>L) 06/29/2015   Anxiety and depression 01/20/2015   Long term prescription opiate use 38/46/6599   Uncomplicated opioid dependence ([place]) 12/19/2014   Chronic musculoskeletal pain 12/19/2014   Osteoarthrosis 12/19/2014   Vitamin D insufficiency 12/19/2014   Presence of functional implant (Medtronic Lumbar spinal cord stimulator implant) 12/19/2014   Chronic low back pain (Bilateral) (R>L) w/o sciatica 12/19/2014   Chronic lumbar radicular pain (Right L5 dermatome; Left S1 Dermatome) (Bilateral) (R>L) 12/19/2014   Chronic neck pain (1ry area of Pain) (Bilateral) (L>R) 12/19/2014   Morbid obesity ([place]) 12/19/2014   Chronic cervical radicular pain (C7 Dermatome) (Bilateral) (L>R) 12/19/2014   Chronic lower extremity pain (Bilateral) (R>L) 12/19/2014   Severe obesity with body mass index (BMI) of 35.0 to 39.9 with comorbidity ([place]) 10/27/2014   Generalized  anxiety disorder 03/16/2014   Essential hypertension, benign 07/09/2013   Chronic pain syndrome 07/09/2013   GERD 07/02/2007   Irritable bowel syndrome 07/02/2007   Kerin Perna, PTA 08/04/20 [facility] [place], [place], [address] Phone: [phone]   Fax:  [phone]  Name: EYLEEN RAWLINSON MRN: [id number] Date of Birth: 1976/01/27

## 2020-08-04 NOTE — Addendum Note (Signed)
Addended bySamuel Bouche on: 08/04/2020 12:38 PM   Modules accepted: Orders

## 2020-08-07 ENCOUNTER — Encounter: Payer: Self-pay | Admitting: Physical Therapy

## 2020-08-08 LAB — TOXASSURE SELECT 13 (MW), URINE

## 2020-08-10 ENCOUNTER — Other Ambulatory Visit: Payer: Self-pay

## 2020-08-10 ENCOUNTER — Ambulatory Visit (INDEPENDENT_AMBULATORY_CARE_PROVIDER_SITE_OTHER): Payer: 59 | Admitting: Rehabilitative and Restorative Service Providers"

## 2020-08-10 DIAGNOSIS — M6281 Muscle weakness (generalized): Secondary | ICD-10-CM

## 2020-08-10 DIAGNOSIS — R29898 Other symptoms and signs involving the musculoskeletal system: Secondary | ICD-10-CM

## 2020-08-10 DIAGNOSIS — M25551 Pain in right hip: Secondary | ICD-10-CM | POA: Diagnosis not present

## 2020-08-10 NOTE — Therapy (Signed)
[place], [place], [address] Phone: [phone]   Fax:  [phone]  Physical Therapy Treatment  Patient Details  Name: Mary Cruz MRN: [id number] Date of Birth: Jun 02, 1975 Referring Provider (PT): Samuel Bouche, MD   Encounter Date: 08/10/2020   PT End of Session - 08/10/20 1107     Visit Number 5    Number of Visits 12    Date for PT Re-Evaluation 08/24/20    Authorization Type UHC    PT Start Time 1103    PT Stop Time 1145    PT Time Calculation (min) 42 min    Activity Tolerance Patient tolerated treatment well    Behavior During Therapy [facility] for tasks assessed/performed             Past Medical History:  Diagnosis Date   Abscess of axillary fold 10/10/2013   Acute bronchitis with asthma with acute exacerbation 01/23/2015   Dehydration 04/19/2019   Elevated lactic acid level 04/19/2019   Encounter for management of implanted device 12/19/2014   Encounter for therapeutic drug level monitoring 12/19/2014   Fibromyalgia    Hyperglycemia 04/19/2019   Hypertension    Implant pain at battery site (right buttocks area) 04/05/2015   KIDNEY STONES 07/02/2007   Qualifier: History of  By: Bobby Rumpf CMA (AAMA), Patty     Long term current use of opiate analgesic 12/19/2014   Lupus ([place])    Morbid obesity ([place]) 12/19/2014   Muscle cramping 12/19/2014   Opiate use (30 MME/Day) 12/19/2014   Osteoarthritis    Pain    chronic regional pain syndrome   Right knee pain 01/10/2015   Sepsis ([place]) 04/19/2019   Sepsis affecting skin 10/10/2013   Tobacco abuse 04/19/2019    Past Surgical History:  Procedure Laterality Date   BREAST SURGERY     CHOLECYSTECTOMY  2000   ENDOMETRIAL ABLATION     KNEE SURGERY     KNEE SURGERY Right 02-2015   LAPAROSCOPIC APPENDECTOMY Right 01/01/2020   Procedure: APPENDECTOMY LAPAROSCOPIC;  Surgeon: Rolm Bookbinder, MD;  Location[facility];  Service: General;  Laterality: Right;    OCCIPITAL NERVE STIMULATOR INSERTION     SHOULDER ARTHROSCOPY WITH DISTAL CLAVICLE RESECTION Right 10/29/2018   Procedure: SHOULDER ARTHROSCOPY WITH DISTAL CLAVICLE RESECTION;  Surgeon: Hiram Gash, MD;  Location: [place];  Service: Orthopedics;  Laterality: Right;   SHOULDER SURGERY     spinal cord stimulator     SPINAL CORD STIMULATOR IMPLANT  2008   SPINAL CORD STIMULATOR INSERTION N/A 06/21/2016   Procedure: LUMBAR SPINAL CORD STIMULATOR INSERTION;  Surgeon: Clydell Hakim, MD;  Location: [place];  Service: Neurosurgery;  Laterality: N/A;  LUMBAR SPINAL CORD STIMULATOR INSERTION   SUBACROMIAL DECOMPRESSION Right 10/29/2018   Procedure: SUBACROMIAL DECOMPRESSION;  Surgeon: Hiram Gash, MD;  Location: [place];  Service: Orthopedics;  Laterality: Right;   TUBAL LIGATION  2000    There were no vitals filed for this visit.   Subjective Assessment - 08/10/20 1103     Subjective The patient reports she felt great int he water Friday.  She overdid it on Saturday and has leg soreness.    Pertinent History spinal DDD, has pain mgmt doctor for back and neck;  HTN    Patient Stated Goals Be able to walk without pain    Currently in Pain? Yes    Pain Score 4     Pain Location Hip  Pain Orientation Right;Lateral    Pain Onset More than a month ago    Pain Frequency Constant    Aggravating Factors  walking,    Pain Relieving Factors rest, pool, tape                [facility] PT Assessment - 08/10/20 1108       Assessment   Medical Diagnosis R hip pain    Referring Provider (PT) Samuel Bouche, MD    Onset Date/Surgical Date 05/06/20                           [facility] Adult PT Treatment/Exercise - 08/10/20 0001       Exercises   Other Exercises  sitting on tennis ball between iscial tuberosity and coccyx to work on tight musculature      Knee/Hip Exercises: Stretches   Active Hamstring Stretch Right;Left;2 reps;30 seconds    ITB  Stretch Right;1 rep;30 seconds    ITB Stretch Limitations did in sidelying, tried with trunk rotation using strap, however irritates R hip, therefore did not do for HEP    Other Knee/Hip Stretches wide leg lumbar rotation    Other Knee/Hip Stretches L leg off table (ine prone) press ups with R hip flexor stretch and low back extension      Knee/Hip Exercises: Aerobic   Tread Mill x 2 minutes-- gets a "locking sensation" in her R hip      Knee/Hip Exercises: Standing   Hip Abduction Stengthening;Both   8 reps x 2 sets     Knee/Hip Exercises: Supine   Other Supine Knee/Hip Exercises supine marching x 5 reps      Knee/Hip Exercises: Prone   Other Prone Exercises plank x 5 reps x 10 seconds      Manual Therapy   Manual Therapy Taping;Soft tissue mobilization    Soft tissue mobilization skilled palpation for assessment of STM and DN    Kinesiotex Create Space      Kinesiotix   Create Space Star shape of reg Rock tape applied to Rt lateral hip to decompress and assist with edema reduction and increase proprioception              Trigger Point Dry Needling - 08/10/20 1137     Consent Given? Yes    Education Handout Provided Yes    Muscles Treated Lower Quadrant Hamstring    Muscles Treated Back/Hip Gluteus medius    Dry Needling Comments R side only    Hamstring Response Palpable increased muscle length    Gluteus Medius Response Palpable increased muscle length                       PT Long Term Goals - 07/13/20 1241       PT LONG TERM GOAL #1   Title The patient will be indep with HEP for self soft tissue mobilization, strengthening, flexibility.    Time 6    Period Weeks    Target Date 08/24/20      PT LONG TERM GOAL #2   Title The patient will improve pain from 5/10 at baseline to < or equal to 3/10    Time 6    Period Weeks    Target Date 08/24/20      PT LONG TERM GOAL #3   Title The patient will improve functional status score from 29% to >or  equal ot 56%.  Time 6    Period Weeks    Target Date 08/24/20      PT LONG TERM GOAL #4   Title The patient will improve R LE strength to 4/5 for hip flexion, abduction, knee flexion/extension and ankle DF.    Time 6    Period Weeks    Target Date 08/24/20                   Plan - 08/10/20 1813     Clinical Impression Statement The patient tolerated DN and STM well today.  The patient feels improvement working in water and PT to continue with land and aquatic visits to work to [employer].    PT Treatment/Interventions Taping;Patient/family education;ADLs/Self Care Home Management;Therapeutic activities;Therapeutic exercise;Electrical Stimulation;Moist Heat;Aquatic Therapy;Dry needling;Manual techniques;Gait training;Stair training    PT Next Visit Plan continue LE strengthening to tolerance, stretching and STM.    PT Home Exercise Plan Access Code: [email or web address]    [email or web address] and Agree with Plan of Care Patient             Patient will benefit from skilled therapeutic intervention in order to improve the following deficits and impairments:     Visit Diagnosis: Pain in right hip  Muscle weakness (generalized)  Other symptoms and signs involving the musculoskeletal system     Problem List Patient Active Problem List   Diagnosis Date Noted   Metabolic syndrome 09/73/5329   Mixed hyperlipidemia 08/04/2020   Chronic use of opiate for therapeutic purpose 06/27/2020   Closed fracture of multiple ribs of right side 05/08/2020   Contusion of both lungs 05/08/2020   Pneumothorax, right 05/08/2020   Fall from horse 05/06/2020   Interstitial cystitis 05/04/2020   Vaginal candidiasis 05/04/2020   Intractable low back pain 04/25/2020   Appendicitis 01/01/2020   Chronic low back pain (Right) w/ sciatica (Right) 06/21/2019   Chronic lower extremity pain (Right) 06/21/2019   Lumbosacral radiculopathy at L5 (Right) 06/21/2019   Dropfoot (Right) 06/21/2019   Lumbar facet  arthropathy 06/21/2019   Colitis 04/19/2019   Osteoarthritis involving multiple joints 04/08/2019   Latex precautions, history of latex allergy 04/08/2019   Spondylosis without myelopathy or radiculopathy, lumbosacral region 02/25/2019   DDD (degenerative disc disease), lumbar 02/25/2019   DDD (degenerative disc disease), cervical 11/15/2018   Cervicalgia 11/15/2018   Abnormal MRI, shoulder (Right) 11/15/2018   Chronic upper extremity pain (2ry area of Pain) (Bilateral) (L>R) 08/20/2018   Chronic hip pain (3ry area of Pain) (Bilateral) (R>L) 08/20/2018   Closed fracture of scapula, sequela (Right) 08/20/2018   Pharmacologic therapy 08/20/2018   Disorder of skeletal system 08/20/2018   Problems influencing health status 08/20/2018   Pain in right knee 03/19/2017   Family history of thyroid disease 10/24/2016   Neurogenic pain 10/10/2016   Fibromyalgia 10/10/2016   Spinal cord stimulator status 05/16/2016   Battery end of life of spinal cord stimulator 05/16/2016   Positive ANA (antinuclear antibody) 09/13/2015   Lumbar spondylosis 06/29/2015   Lumbar facet syndrome (Bilateral) (R>L) 06/29/2015   Anxiety and depression 01/20/2015   Long term prescription opiate use 92/42/6834   Uncomplicated opioid dependence ([place]) 12/19/2014   Chronic musculoskeletal pain 12/19/2014   Osteoarthrosis 12/19/2014   Vitamin D insufficiency 12/19/2014   Presence of functional implant (Medtronic Lumbar spinal cord stimulator implant) 12/19/2014   Chronic low back pain (Bilateral) (R>L) w/o sciatica 12/19/2014   Chronic lumbar radicular pain (Right L5 dermatome; Left S1 Dermatome) (Bilateral) (R>L) 12/19/2014   Chronic neck  pain (1ry area of Pain) (Bilateral) (L>R) 12/19/2014   Morbid obesity ([place]) 12/19/2014   Chronic cervical radicular pain (C7 Dermatome) (Bilateral) (L>R) 12/19/2014   Chronic lower extremity pain (Bilateral) (R>L) 12/19/2014   Severe obesity with body mass index (BMI) of 35.0 to 39.9  with comorbidity ([place]) 10/27/2014   Generalized anxiety disorder 03/16/2014   Essential hypertension, benign 07/09/2013   Chronic pain syndrome 07/09/2013   GERD 07/02/2007   Irritable bowel syndrome 07/02/2007    Bonne Whack,PT 08/10/2020, 6:14 PM  [place], [place], [address] Phone: [phone]   Fax:  [phone]  Name: Mary Cruz MRN: [id number] Date of Birth: 11-25-1975

## 2020-08-11 ENCOUNTER — Ambulatory Visit (INDEPENDENT_AMBULATORY_CARE_PROVIDER_SITE_OTHER): Payer: 59 | Admitting: Physical Therapy

## 2020-08-11 DIAGNOSIS — M6281 Muscle weakness (generalized): Secondary | ICD-10-CM | POA: Diagnosis not present

## 2020-08-11 DIAGNOSIS — M25551 Pain in right hip: Secondary | ICD-10-CM

## 2020-08-11 DIAGNOSIS — R29898 Other symptoms and signs involving the musculoskeletal system: Secondary | ICD-10-CM | POA: Diagnosis not present

## 2020-08-11 NOTE — Therapy (Signed)
[address] [place], [place], [address] Phone: [phone]   Fax:  [phone]  Physical Therapy Treatment  Patient Details  Name: Mary Cruz MRN: [id number] Date of Birth: [birth year] Referring Provider (PT): Samuel Bouche, MD   Encounter Date: 08/11/2020   PT End of Session - 08/11/20 1318     Visit Number 6    Number of Visits 12    Date for PT Re-Evaluation 08/24/20    Authorization Type UHC    PT Start Time 1310    PT Stop Time 1694    PT Time Calculation (min) 45 min    Activity Tolerance Patient tolerated treatment well    Behavior During Therapy [facility] for tasks assessed/performed             Past Medical History:  Diagnosis Date   Abscess of axillary fold 10/10/2013   Acute bronchitis with asthma with acute exacerbation 01/23/2015   Dehydration 04/19/2019   Elevated lactic acid level 04/19/2019   Encounter for management of implanted device 12/19/2014   Encounter for therapeutic drug level monitoring 12/19/2014   Fibromyalgia    Hyperglycemia 04/19/2019   Hypertension    Implant pain at battery site (right buttocks area) 04/05/2015   KIDNEY STONES 07/02/2007   Qualifier: History of  By: Bobby Rumpf CMA (AAMA), Patty     Long term current use of opiate analgesic 12/19/2014   Lupus ([place])    Morbid obesity ([place]) 12/19/2014   Muscle cramping 12/19/2014   Opiate use (30 MME/Day) 12/19/2014   Osteoarthritis    Pain    chronic regional pain syndrome   Right knee pain 01/10/2015   Sepsis ([place]) 04/19/2019   Sepsis affecting skin 10/10/2013   Tobacco abuse 04/19/2019    Past Surgical History:  Procedure Laterality Date   BREAST SURGERY     CHOLECYSTECTOMY  2000   ENDOMETRIAL ABLATION     KNEE SURGERY     KNEE SURGERY Right 02-2015   LAPAROSCOPIC APPENDECTOMY Right 01/01/2020   Procedure: APPENDECTOMY LAPAROSCOPIC;  Surgeon: Rolm Bookbinder, MD;  Location: [facility];  Service: General;  Laterality: Right;    OCCIPITAL NERVE STIMULATOR INSERTION     SHOULDER ARTHROSCOPY WITH DISTAL CLAVICLE RESECTION Right 10/29/2018   Procedure: SHOULDER ARTHROSCOPY WITH DISTAL CLAVICLE RESECTION;  Surgeon: Hiram Gash, MD;  Location: [place];  Service: Orthopedics;  Laterality: Right;   SHOULDER SURGERY     spinal cord stimulator     SPINAL CORD STIMULATOR IMPLANT  2008   SPINAL CORD STIMULATOR INSERTION N/A 06/21/2016   Procedure: LUMBAR SPINAL CORD STIMULATOR INSERTION;  Surgeon: Clydell Hakim, MD;  Location: [place];  Service: Neurosurgery;  Laterality: N/A;  LUMBAR SPINAL CORD STIMULATOR INSERTION   SUBACROMIAL DECOMPRESSION Right 10/29/2018   Procedure: SUBACROMIAL DECOMPRESSION;  Surgeon: Hiram Gash, MD;  Location: [place];  Service: Orthopedics;  Laterality: Right;   TUBAL LIGATION  2000    There were no vitals filed for this visit.   Subjective Assessment - 08/11/20 1319     Subjective Pt reports that her hip was "crampy" last night, but less tight.  She was able to walk across parking lot today without having to stop to take a rest break.    Patient Stated Goals Be able to walk without pain    Currently in Pain? Yes    Pain Score 3     Pain Location Hip    Pain Orientation Right;Lateral  Pain Descriptors / Indicators Aching;Dull            Pt seen for aquatic therapy today.  Treatment took place in water 3.25-4 ft in depth at the [facility] pool. Temp of water was 91.  Pt entered/exited the pool via stairs independently with single UE on rail.  Treatment:   Gait (without floatation assistance):  forward, backward, grapevine, wide to/from narrow, tandem.   Side step with squat. Split squat while walking forward.   Holding onto floatation dumbbell:   squats, submerging dumbbell below water x 20 reps  Holding onto side of pool: Heel raises x 20, hip ext x 10 each leg, hip abd x 10 each leg, Hip circles CW x 10 each leg   Sit to/from stand  at bench x 10, with pause in squat 1/2 way down.    TA set with submerging big square noodle x 3 sec x 10 reps, then forward row x 10 with TA set.  SLS with intermittent UE support to steady, x 15 sec each leg, 2 reps  Seated modified pigeon pose on bench x 20 sec RLE, LLE.   Single leg long sitting hamstring stretch x 20 sec x 2 reps each leg. Low back stretch with feet on wall, holding onto ledge x 10 sec x 2 reps    Pt requires buoyancy for support and to offload joints with strengthening exercises. Viscosity of the water is needed for resistance of strengthening; water current perturbations provides challenge to standing balance unsupported, requiring increased core activation.     PT Long Term Goals - 07/13/20 1241       PT LONG TERM GOAL #1   Title The patient will be indep with HEP for self soft tissue mobilization, strengthening, flexibility.    Time 6    Period Weeks    Target Date 08/24/20      PT LONG TERM GOAL #2   Title The patient will improve pain from 5/10 at baseline to < or equal to 3/10    Time 6    Period Weeks    Target Date 08/24/20      PT LONG TERM GOAL #3   Title The patient will improve functional status score from 29% to >or equal ot 56%.    Time 6    Period Weeks    Target Date 08/24/20      PT LONG TERM GOAL #4   Title The patient will improve R LE strength to 4/5 for hip flexion, abduction, knee flexion/extension and ankle DF.    Time 6    Period Weeks    Target Date 08/24/20                   Plan - 08/11/20 1349     Clinical Impression Statement Pt reports elimination of pain in hip when in water completing exercises.  She tolerated all exercises well, without production of pain. Pt able to complete modified pigeon pose on pool bench with much greater ease and no pain.  Progressing towards goals.    Rehab Potential Good    PT Frequency 2x / week    PT Duration 6 weeks    PT Treatment/Interventions Taping;Patient/family  education;ADLs/Self Care Home Management;Therapeutic activities;Therapeutic exercise;Electrical Stimulation;Moist Heat;Aquatic Therapy;Dry needling;Manual techniques;Gait training;Stair training    PT Next Visit Plan continue LE strengthening to tolerance, stretching and STM.    PT Home Exercise Plan Access Code: [email or web address]    [email or web address] and Agree with Plan  of Care Patient             Patient will benefit from skilled therapeutic intervention in order to improve the following deficits and impairments:  Pain, Hypomobility, Impaired flexibility, Postural dysfunction, Decreased strength, Increased fascial restricitons, Decreased range of motion, Decreased activity tolerance  Visit Diagnosis: Pain in right hip  Muscle weakness (generalized)  Other symptoms and signs involving the musculoskeletal system     Problem List Patient Active Problem List   Diagnosis Date Noted   Metabolic syndrome 33/00/7622   Mixed hyperlipidemia 08/04/2020   Chronic use of opiate for therapeutic purpose 06/27/2020   Closed fracture of multiple ribs of right side 05/08/2020   Contusion of both lungs 05/08/2020   Pneumothorax, right 05/08/2020   Fall from horse 05/06/2020   Interstitial cystitis 05/04/2020   Vaginal candidiasis 05/04/2020   Intractable low back pain 04/25/2020   Appendicitis 01/01/2020   Chronic low back pain (Right) w/ sciatica (Right) 06/21/2019   Chronic lower extremity pain (Right) 06/21/2019   Lumbosacral radiculopathy at L5 (Right) 06/21/2019   Dropfoot (Right) 06/21/2019   Lumbar facet arthropathy 06/21/2019   Colitis 04/19/2019   Osteoarthritis involving multiple joints 04/08/2019   Latex precautions, history of latex allergy 04/08/2019   Spondylosis without myelopathy or radiculopathy, lumbosacral region 02/25/2019   DDD (degenerative disc disease), lumbar 02/25/2019   DDD (degenerative disc disease), cervical 11/15/2018   Cervicalgia 11/15/2018   Abnormal MRI, shoulder  (Right) 11/15/2018   Chronic upper extremity pain (2ry area of Pain) (Bilateral) (L>R) 08/20/2018   Chronic hip pain (3ry area of Pain) (Bilateral) (R>L) 08/20/2018   Closed fracture of scapula, sequela (Right) 08/20/2018   Pharmacologic therapy 08/20/2018   Disorder of skeletal system 08/20/2018   Problems influencing health status 08/20/2018   Pain in right knee 03/19/2017   Family history of thyroid disease 10/24/2016   Neurogenic pain 10/10/2016   Fibromyalgia 10/10/2016   Spinal cord stimulator status 05/16/2016   Battery end of life of spinal cord stimulator 05/16/2016   Positive ANA (antinuclear antibody) 09/13/2015   Lumbar spondylosis 06/29/2015   Lumbar facet syndrome (Bilateral) (R>L) 06/29/2015   Anxiety and depression 01/20/2015   Long term prescription opiate use 63/33/5456   Uncomplicated opioid dependence ([place]) 12/19/2014   Chronic musculoskeletal pain 12/19/2014   Osteoarthrosis 12/19/2014   Vitamin D insufficiency 12/19/2014   Presence of functional implant (Medtronic Lumbar spinal cord stimulator implant) 12/19/2014   Chronic low back pain (Bilateral) (R>L) w/o sciatica 12/19/2014   Chronic lumbar radicular pain (Right L5 dermatome; Left S1 Dermatome) (Bilateral) (R>L) 12/19/2014   Chronic neck pain (1ry area of Pain) (Bilateral) (L>R) 12/19/2014   Morbid obesity ([place]) 12/19/2014   Chronic cervical radicular pain (C7 Dermatome) (Bilateral) (L>R) 12/19/2014   Chronic lower extremity pain (Bilateral) (R>L) 12/19/2014   Severe obesity with body mass index (BMI) of 35.0 to 39.9 with comorbidity ([place]) 10/27/2014   Generalized anxiety disorder 03/16/2014   Essential hypertension, benign 07/09/2013   Chronic pain syndrome 07/09/2013   GERD 07/02/2007   Irritable bowel syndrome 07/02/2007   Kerin Perna, PTA 08/11/20 1:52 PM  [facility] [place] [place], [place], [address] Phone: [phone]    Fax:  [phone]  Name: Mary Cruz MRN: [id number] Date of Birth: 02/08/1976

## 2020-08-16 ENCOUNTER — Other Ambulatory Visit: Payer: Self-pay | Admitting: Medical-Surgical

## 2020-08-16 ENCOUNTER — Encounter: Payer: 59 | Admitting: Physical Therapy

## 2020-08-16 MED ORDER — PHENTERMINE HCL 15 MG PO CAPS
15.0000 mg | ORAL_CAPSULE | ORAL | 0 refills | Status: DC
Start: 2020-08-16 — End: 2020-08-16

## 2020-08-16 MED ORDER — TOPIRAMATE 25 MG PO TABS
25.0000 mg | ORAL_TABLET | Freq: Every day | ORAL | 1 refills | Status: DC
Start: 2020-08-16 — End: 2020-09-21

## 2020-08-16 MED ORDER — SAXENDA 18 MG/3ML ~~LOC~~ SOPN: 3.0000 mg | PEN_INJECTOR | Freq: Every day | SUBCUTANEOUS | 0 refills | Status: DC

## 2020-08-16 MED ORDER — PHENTERMINE HCL 15 MG PO CAPS: 15.0000 mg | ORAL_CAPSULE | ORAL | 0 refills | Status: DC

## 2020-08-16 NOTE — Addendum Note (Signed)
Addended bySamuel Bouche on: 08/16/2020 02:13 PM   Modules accepted: Orders

## 2020-08-18 ENCOUNTER — Other Ambulatory Visit: Payer: Self-pay

## 2020-08-18 ENCOUNTER — Encounter: Payer: Self-pay | Admitting: Physical Therapy

## 2020-08-18 ENCOUNTER — Ambulatory Visit (INDEPENDENT_AMBULATORY_CARE_PROVIDER_SITE_OTHER): Payer: 59 | Admitting: Physical Therapy

## 2020-08-18 DIAGNOSIS — R29898 Other symptoms and signs involving the musculoskeletal system: Secondary | ICD-10-CM | POA: Diagnosis not present

## 2020-08-18 DIAGNOSIS — M25551 Pain in right hip: Secondary | ICD-10-CM | POA: Diagnosis not present

## 2020-08-18 DIAGNOSIS — M6281 Muscle weakness (generalized): Secondary | ICD-10-CM

## 2020-08-18 NOTE — Therapy (Addendum)
[place], [place], [address] Phone: [phone]   Fax:  [phone]  Physical Therapy Treatment and Discharge Summary  Patient Details  Name: Mary Cruz MRN: [id number] Date of Birth: 12/15/1975 Referring Provider (PT): Samuel Bouche, MD   Encounter Date: 08/18/2020   PT End of Session - 08/18/20 1359     Visit Number 7    Number of Visits 12    Date for PT Re-Evaluation 08/24/20    Authorization Type UHC    PT Start Time 5072    PT Stop Time 1440    PT Time Calculation (min) 46 min    Activity Tolerance Patient tolerated treatment well    Behavior During Therapy [facility] for tasks assessed/performed             Past Medical History:  Diagnosis Date   Abscess of axillary fold 10/10/2013   Acute bronchitis with asthma with acute exacerbation 01/23/2015   Dehydration 04/19/2019   Elevated lactic acid level 04/19/2019   Encounter for management of implanted device 12/19/2014   Encounter for therapeutic drug level monitoring 12/19/2014   Fibromyalgia    Hyperglycemia 04/19/2019   Hypertension    Implant pain at battery site (right buttocks area) 04/05/2015   KIDNEY STONES 07/02/2007   Qualifier: History of  By: Bobby Rumpf CMA (AAMA), Patty     Long term current use of opiate analgesic 12/19/2014   Lupus ([place])    Morbid obesity ([place]) 12/19/2014   Muscle cramping 12/19/2014   Opiate use (30 MME/Day) 12/19/2014   Osteoarthritis    Pain    chronic regional pain syndrome   Right knee pain 01/10/2015   Sepsis ([place]) 04/19/2019   Sepsis affecting skin 10/10/2013   Tobacco abuse 04/19/2019    Past Surgical History:  Procedure Laterality Date   BREAST SURGERY     CHOLECYSTECTOMY  2000   ENDOMETRIAL ABLATION     KNEE SURGERY     KNEE SURGERY Right 02-2015   LAPAROSCOPIC APPENDECTOMY Right 01/01/2020   Procedure: APPENDECTOMY LAPAROSCOPIC;  Surgeon: Rolm Bookbinder, MD;  Location[facility];  Service: General;   Laterality: Right;   OCCIPITAL NERVE STIMULATOR INSERTION     SHOULDER ARTHROSCOPY WITH DISTAL CLAVICLE RESECTION Right 10/29/2018   Procedure: SHOULDER ARTHROSCOPY WITH DISTAL CLAVICLE RESECTION;  Surgeon: Hiram Gash, MD;  Location: [facility];  Service: Orthopedics;  Laterality: Right;   SHOULDER SURGERY     spinal cord stimulator     SPINAL CORD STIMULATOR IMPLANT  2008   SPINAL CORD STIMULATOR INSERTION N/A 06/21/2016   Procedure: LUMBAR SPINAL CORD STIMULATOR INSERTION;  Surgeon: Clydell Hakim, MD;  Location: [facility];  Service: Neurosurgery;  Laterality: N/A;  LUMBAR SPINAL CORD STIMULATOR INSERTION   SUBACROMIAL DECOMPRESSION Right 10/29/2018   Procedure: SUBACROMIAL DECOMPRESSION;  Surgeon: Hiram Gash, MD;  Location: [facility];  Service: Orthopedics;  Laterality: Right;   TUBAL LIGATION  2000    There were no vitals filed for this visit.   Subjective Assessment - 08/18/20 1400     Subjective Pt reports she has had headaches/ migranes lately, "I think it may be the weather".  She reports that her Rt hip hurt a little more yesterday and she is unsure why.  She has had some skin irritation from tape.    Currently in Pain? Yes    Pain Score 5     Pain Location Hip    Pain Orientation Right;Lateral  Pain Descriptors / Indicators Aching;Dull    Pain Radiating Towards into lower back.    Aggravating Factors  walking    Pain Relieving Factors rest, tape, pool.            Pt seen for aquatic therapy today.  Treatment took place in water 3.25-4 ft in depth at the [facility] pool. Temp of water was 93.  Pt entered/exited the pool via stairs independently with single rail.  Treatment:   Gait without floatation support: forward, backward, side stepping, high knee marching forward and backward.   Step ups: Rt forward, Rt lateral x 10 each, with light UE support on pool edge.  Holding onto edge:  Leg press pushing skinny square noodle to  floor in hip flexion and hip abdct x 10 each leg.  Then 5 reps with big yellow noodle behind knee into hip flex. Heel raises x 10, squats x 10.  Low back stretch x 30 sec.  Side to side lunge for adductor stretch. Hip ext and abdct x 10 each leg.   Without support:  Light jumping jacks and forward/backward scissors x 5 each.  Lunge walk forward.  Rt hamstring and ITB stretch with foot supported by noodle.   Holding onto noodle, suspending legs in deep area: bicycle and hip abdct/add.   Seated on bench:  R/L hamstring stretch and modified pigeon pose x 30 sec each.    Pt requires buoyancy for support and to offload joints with strengthening exercises. Viscosity of the water is needed for resistance of strengthening; water current perturbations provides challenge to standing balance unsupported, requiring increased core activation.      PT Long Term Goals - 07/13/20 1241       PT LONG TERM GOAL #1   Title The patient will be indep with HEP for self soft tissue mobilization, strengthening, flexibility.    Time 6    Period Weeks    Target Date 08/24/20      PT LONG TERM GOAL #2   Title The patient will improve pain from 5/10 at baseline to < or equal to 3/10    Time 6    Period Weeks    Target Date 08/24/20      PT LONG TERM GOAL #3   Title The patient will improve functional status score from 29% to >or equal ot 56%.    Time 6    Period Weeks    Target Date 08/24/20      PT LONG TERM GOAL #4   Title The patient will improve R LE strength to 4/5 for hip flexion, abduction, knee flexion/extension and ankle DF.    Time 6    Period Weeks    Target Date 08/24/20                   Plan - 08/18/20 1407     Clinical Impression Statement Pt arrived with elevated pain level in Rt hip. Pain slowly reduced with exercises in sternum, but remained around a 3/10. She completed quite a bit of exercise in pool today, without increase in pain. She does report discomfort in lateral  Rt hip with hip ext (leg kicks and retro gait), but all within tolerance. She is reporting improved standing tolerance since initiating therapy.  Progressing towards LTGs.    Comorbidities HTN, chronic neck and back pain with prolonged use of pain meds    Rehab Potential Good    PT Frequency 2x / week  PT Duration 6 weeks    PT Treatment/Interventions Taping;Patient/family education;ADLs/Self Care Home Management;Therapeutic activities;Therapeutic exercise;Electrical Stimulation;Moist Heat;Aquatic Therapy;Dry needling;Manual techniques;Gait training;Stair training    PT Next Visit Plan continue LE strengthening to tolerance, stretching and STM/DN/ trial sensitive skin tape. May benefit from skin prep applied to area prior to application.    PT Home Exercise Plan Access Code: [email or web address]    [email or web address] and Agree with Plan of Care Patient             Patient will benefit from skilled therapeutic intervention in order to improve the following deficits and impairments:  Pain, Hypomobility, Impaired flexibility, Postural dysfunction, Decreased strength, Increased fascial restricitons, Decreased range of motion, Decreased activity tolerance  Visit Diagnosis: Pain in right hip  Muscle weakness (generalized)  Other symptoms and signs involving the musculoskeletal system  PHYSICAL THERAPY DISCHARGE SUMMARY  Visits from Start of Care: 7  Current functional level related to goals / functional outcomes: See goals above.   Remaining deficits: *The patient did not return after this visit- cancelling due to flare up of lupus.  See note for last known patient status.   Education / Equipment: HEP   Patient goals were not met. Patient is being discharged due to not returning since the last visit. 7   Problem List Patient Active Problem List   Diagnosis Date Noted   Metabolic syndrome 37/62/8315   Mixed hyperlipidemia 08/04/2020   Chronic use of opiate for therapeutic purpose 06/27/2020    Closed fracture of multiple ribs of right side 05/08/2020   Contusion of both lungs 05/08/2020   Pneumothorax, right 05/08/2020   Fall from horse 05/06/2020   Interstitial cystitis 05/04/2020   Vaginal candidiasis 05/04/2020   Intractable low back pain 04/25/2020   Appendicitis 01/01/2020   Chronic low back pain (Right) w/ sciatica (Right) 06/21/2019   Chronic lower extremity pain (Right) 06/21/2019   Lumbosacral radiculopathy at L5 (Right) 06/21/2019   Dropfoot (Right) 06/21/2019   Lumbar facet arthropathy 06/21/2019   Colitis 04/19/2019   Osteoarthritis involving multiple joints 04/08/2019   Latex precautions, history of latex allergy 04/08/2019   Spondylosis without myelopathy or radiculopathy, lumbosacral region 02/25/2019   DDD (degenerative disc disease), lumbar 02/25/2019   DDD (degenerative disc disease), cervical 11/15/2018   Cervicalgia 11/15/2018   Abnormal MRI, shoulder (Right) 11/15/2018   Chronic upper extremity pain (2ry area of Pain) (Bilateral) (L>R) 08/20/2018   Chronic hip pain (3ry area of Pain) (Bilateral) (R>L) 08/20/2018   Closed fracture of scapula, sequela (Right) 08/20/2018   Pharmacologic therapy 08/20/2018   Disorder of skeletal system 08/20/2018   Problems influencing health status 08/20/2018   Pain in right knee 03/19/2017   Family history of thyroid disease 10/24/2016   Neurogenic pain 10/10/2016   Fibromyalgia 10/10/2016   Spinal cord stimulator status 05/16/2016   Battery end of life of spinal cord stimulator 05/16/2016   Positive ANA (antinuclear antibody) 09/13/2015   Lumbar spondylosis 06/29/2015   Lumbar facet syndrome (Bilateral) (R>L) 06/29/2015   Anxiety and depression 01/20/2015   Long term prescription opiate use 17/61/6073   Uncomplicated opioid dependence ([place]) 12/19/2014   Chronic musculoskeletal pain 12/19/2014   Osteoarthrosis 12/19/2014   Vitamin D insufficiency 12/19/2014   Presence of functional implant (Medtronic Lumbar  spinal cord stimulator implant) 12/19/2014   Chronic low back pain (Bilateral) (R>L) w/o sciatica 12/19/2014   Chronic lumbar radicular pain (Right L5 dermatome; Left S1 Dermatome) (Bilateral) (R>L) 12/19/2014   Chronic neck pain (1ry area of Pain) (Bilateral) (  L>R) 12/19/2014   Morbid obesity ([place]) 12/19/2014   Chronic cervical radicular pain (C7 Dermatome) (Bilateral) (L>R) 12/19/2014   Chronic lower extremity pain (Bilateral) (R>L) 12/19/2014   Severe obesity with body mass index (BMI) of 35.0 to 39.9 with comorbidity ([place]) 10/27/2014   Generalized anxiety disorder 03/16/2014   Essential hypertension, benign 07/09/2013   Chronic pain syndrome 07/09/2013   GERD 07/02/2007   Irritable bowel syndrome 07/02/2007   Kerin Perna, PTA 08/18/20 4:40 PM Rudell Cobb, PT   [facility] [place], [place], [address] Phone: [phone]   Fax:  [phone]  Name: Mary Cruz MRN: [id number] Date of Birth: October 23, 1975

## 2020-08-24 ENCOUNTER — Other Ambulatory Visit: Payer: Self-pay

## 2020-08-24 ENCOUNTER — Encounter: Payer: Self-pay | Admitting: Medical-Surgical

## 2020-08-24 ENCOUNTER — Encounter: Payer: 59 | Admitting: Rehabilitative and Restorative Service Providers"

## 2020-08-24 ENCOUNTER — Ambulatory Visit: Payer: 59 | Admitting: Medical-Surgical

## 2020-08-24 VITALS — BP 126/74 | HR 94 | Temp 98.5°F | Ht 64.0 in | Wt 254.0 lb

## 2020-08-24 DIAGNOSIS — R238 Other skin changes: Secondary | ICD-10-CM

## 2020-08-24 DIAGNOSIS — L989 Disorder of the skin and subcutaneous tissue, unspecified: Secondary | ICD-10-CM

## 2020-08-24 MED ORDER — PHENTERMINE HCL 37.5 MG PO TABS: ORAL_TABLET | ORAL | 0 refills | Status: DC

## 2020-08-24 MED ORDER — VALACYCLOVIR HCL 1 G PO TABS: 1000.0000 mg | ORAL_TABLET | Freq: Three times a day (TID) | ORAL | 0 refills | Status: DC

## 2020-08-24 MED ORDER — TRIAMCINOLONE ACETONIDE 0.1 % EX OINT: 1.0000 "application " | TOPICAL_OINTMENT | Freq: Two times a day (BID) | CUTANEOUS | 6 refills | Status: DC

## 2020-08-24 MED ORDER — PREDNISONE 10 MG (48) PO TBPK: ORAL_TABLET | Freq: Every day | ORAL | 0 refills | Status: DC

## 2020-08-24 NOTE — Progress Notes (Signed)
Subjective:    CC: Rash  HPI: Pleasant 45 year old female presenting today for evaluation of a rash.  She started feeling bad at the end of the week last week with a headache and neck pain.  She had generalized malaise and significant fatigue at that progressed to the weekend.  On Monday, she developed a red patch on her upper left side of her chest which has since spread to her left shoulder, left neck, and left upper back.  She has had no significant exposures although she does have several pets including [place] pigs, dogs, and a cat.  She did go to the pool last week and spent some time swimming but does not know of any exposures there.  Reports that the rash is very itchy but also has pain which is described as tingly, stabbing, burning, and shooting.  She did try some antifungal cream due to the pattern of the rash but there was no improvement.  She has had some lesions that started draining clear fluid.  She did have chickenpox as a child but has never had shingles.  She does have a skin lesion on her mid right back that has been there for about a year.  She has noted some change in color and size recently and would like to get this evaluated  I reviewed the past medical history, family history, social history, surgical history, and allergies today and no changes were needed.  Please see the problem list section below in epic for further details.  Past Medical History: Past Medical History:  Diagnosis Date   Abscess of axillary fold 10/10/2013   Acute bronchitis with asthma with acute exacerbation 01/23/2015   Dehydration 04/19/2019   Elevated lactic acid level 04/19/2019   Encounter for management of implanted device 12/19/2014   Encounter for therapeutic drug level monitoring 12/19/2014   Fibromyalgia    Hyperglycemia 04/19/2019   Hypertension    Implant pain at battery site (right buttocks area) 04/05/2015   KIDNEY STONES 07/02/2007   Qualifier: History of  By: Bobby Rumpf CMA (AAMA), Patty      Long term current use of opiate analgesic 12/19/2014   Lupus ([place])    Morbid obesity ([place]) 12/19/2014   Muscle cramping 12/19/2014   Opiate use (30 MME/Day) 12/19/2014   Osteoarthritis    Pain    chronic regional pain syndrome   Right knee pain 01/10/2015   Sepsis ([place]) 04/19/2019   Sepsis affecting skin 10/10/2013   Tobacco abuse 04/19/2019   Past Surgical History: Past Surgical History:  Procedure Laterality Date   BREAST SURGERY     CHOLECYSTECTOMY  2000   ENDOMETRIAL ABLATION     KNEE SURGERY     KNEE SURGERY Right 02-2015   LAPAROSCOPIC APPENDECTOMY Right 01/01/2020   Procedure: APPENDECTOMY LAPAROSCOPIC;  Surgeon: Rolm Bookbinder, MD;  Location: [facility];  Service: General;  Laterality: Right;   OCCIPITAL NERVE STIMULATOR INSERTION     SHOULDER ARTHROSCOPY WITH DISTAL CLAVICLE RESECTION Right 10/29/2018   Procedure: SHOULDER ARTHROSCOPY WITH DISTAL CLAVICLE RESECTION;  Surgeon: Hiram Gash, MD;  Location: [place];  Service: Orthopedics;  Laterality: Right;   SHOULDER SURGERY     spinal cord stimulator     SPINAL CORD STIMULATOR IMPLANT  2008   SPINAL CORD STIMULATOR INSERTION N/A 06/21/2016   Procedure: LUMBAR SPINAL CORD STIMULATOR INSERTION;  Surgeon: Clydell Hakim, MD;  Location: [place];  Service: Neurosurgery;  Laterality: N/A;  LUMBAR SPINAL CORD STIMULATOR INSERTION   SUBACROMIAL DECOMPRESSION  Right 10/29/2018   Procedure: SUBACROMIAL DECOMPRESSION;  Surgeon: Hiram Gash, MD;  Location: [place];  Service: Orthopedics;  Laterality: Right;   TUBAL LIGATION  2000   Social History: Social History   Socioeconomic History   Marital status: Married    Spouse name: Not on file   Number of children: Not on file   Years of education: Not on file   Highest education level: Not on file  Occupational History   Not on file  Tobacco Use   Smoking status: Former    Types: Cigarettes   Smokeless tobacco: Former    Quit date: 12/22/2017  Vaping  Use   Vaping Use: Former  Substance and Sexual Activity   Alcohol use: No    Alcohol/week: 0.0 standard drinks   Drug use: No   Sexual activity: Yes    Partners: Male    Birth control/protection: Surgical  Other Topics Concern   Not on file  Social History Narrative   Not on file   Social Determinants of Health   Financial Resource Strain: Not on file  Food Insecurity: Not on file  Transportation Needs: Not on file  Physical Activity: Not on file  Stress: Not on file  Social Connections: Not on file   Family History: Family History  Problem Relation Age of Onset   Depression Mother        maternal grandmother   Asthma Mother    Fibromyalgia Other        aunt   Stroke Other        grandmother   Thyroid disease Other        grandmother   Allergies: Allergies  Allergen Reactions   Celecoxib Swelling    "Body swelling"   Doxycycline Other (See Comments) and Nausea And Vomiting    "SICKNESS"   Gabapentin Swelling    Whole body swelling   Latex Itching and Swelling   Nabumetone Itching   Pentazocine Lactate Itching and Other (See Comments)    HALLUCINATIONS   Seroquel [Quetiapine] Other (See Comments)    Falls   Amoxicillin Diarrhea and Nausea And Vomiting   Bactrim [Sulfamethoxazole-Trimethoprim] Other (See Comments)    "Makes very sick"   Cephalexin Other (See Comments) and Hives    Nausea and causes uti's   Erythromycin Other (See Comments)    RESULTANT UTI   Hydromorphone Hcl Other (See Comments)    HALLUCINATIONS   Lyrica [Pregabalin] Other (See Comments)    "Too sleepy"   Medications: See med rec.  Review of Systems: See HPI for pertinent positives and negatives.   Objective:    General: Well Developed, well nourished, and in no acute distress.  Neuro: Alert and oriented x3, extra-ocular muscles intact, sensation grossly intact.  HEENT: Normocephalic, atraumatic, pupils equal round reactive to light, neck supple, no masses, no lymphadenopathy,  thyroid nonpalpable.  Skin: Warm and dry.  See clinical photo below. Cardiac: Regular rate and rhythm, no murmurs rubs or gallops, no lower extremity edema.  Respiratory: Clear to auscultation bilaterally. Not using accessory muscles, speaking in full sentences.      Impression and Recommendations:    1. Vesicular rash Location not necessarily consistent with shingles.  Vesicles are very small and not likely to provide a good sample of fluid if ruptured.  We will go ahead and treat with a 12-day prednisone taper pack as well as 7 days of Valtrex 1000 mg 3 times daily.  Prescribing triamcinolone cream to use topically  twice daily but okay to use calamine or Benadryl topically if needed.  2.  Skin lesion of back Lesion on back is uniformly dark in color but does have some irregular edges.  Advised patient that we are definitely able to send this off for biopsy to make sure there is nothing more going on than a regular mole.  She would like to get her current rash issue resolved and then will schedule an appointment to have the excision completed.  Return if symptoms worsen or fail to improve.  ___________________________________________ Clearnce Sorrel, DNP, APRN, FNP-BC [facility] [place]

## 2020-08-24 NOTE — Addendum Note (Signed)
Addended bySamuel Bouche on: 08/24/2020 05:46 PM   Modules accepted: Orders

## 2020-08-24 NOTE — Addendum Note (Signed)
Addended bySamuel Bouche on: 08/24/2020 12:02 PM   Modules accepted: Orders

## 2020-08-25 ENCOUNTER — Ambulatory Visit: Payer: Self-pay | Admitting: Physical Therapy

## 2020-08-29 ENCOUNTER — Encounter: Payer: Self-pay | Admitting: Rehabilitative and Restorative Service Providers"

## 2020-08-31 ENCOUNTER — Ambulatory Visit: Payer: 59 | Admitting: Medical-Surgical

## 2020-09-01 ENCOUNTER — Ambulatory Visit: Payer: Self-pay | Admitting: Physical Therapy

## 2020-09-04 ENCOUNTER — Encounter: Payer: Self-pay | Admitting: Medical-Surgical

## 2020-09-05 ENCOUNTER — Telehealth: Payer: Self-pay

## 2020-09-05 NOTE — Telephone Encounter (Signed)
Medication: Wegovy 0.25 mg/ .23m Prior authorization submitted via Cover[place]Meds [place] submission pending

## 2020-09-12 NOTE — Telephone Encounter (Signed)
Referral was denied I have to send to new location I am looking to see where all it was sent so I can try a different location. - CF

## 2020-09-19 ENCOUNTER — Encounter: Payer: Self-pay | Admitting: Medical-Surgical

## 2020-09-19 NOTE — Telephone Encounter (Signed)
Medication: Wegovy 0.25 mg/0.5 mL Prior authorization determination received Medication has been denied Reason for denial:  The requested medication and/or diagnosis are not a covered benefit and excluded from coverage in accordance with the terms and conditions of your plan benefit. Therefore, the request has been administratively denied.

## 2020-09-21 ENCOUNTER — Encounter: Payer: Self-pay | Admitting: Medical-Surgical

## 2020-09-21 ENCOUNTER — Ambulatory Visit: Payer: 59 | Admitting: Medical-Surgical

## 2020-09-21 VITALS — BP 129/80 | HR 98 | Resp 20 | Ht 64.0 in | Wt 255.0 lb

## 2020-09-21 DIAGNOSIS — F411 Generalized anxiety disorder: Secondary | ICD-10-CM

## 2020-09-21 DIAGNOSIS — F32A Depression, unspecified: Secondary | ICD-10-CM | POA: Diagnosis not present

## 2020-09-21 DIAGNOSIS — R21 Rash and other nonspecific skin eruption: Secondary | ICD-10-CM | POA: Diagnosis not present

## 2020-09-21 DIAGNOSIS — F419 Anxiety disorder, unspecified: Secondary | ICD-10-CM

## 2020-09-21 MED ORDER — LIDOCAINE 5 % EX OINT: 1.0000 "application " | TOPICAL_OINTMENT | Freq: Every day | CUTANEOUS | 11 refills | Status: DC

## 2020-09-21 MED ORDER — CLOBETASOL PROPIONATE 0.05 % EX OINT: 1.0000 "application " | TOPICAL_OINTMENT | Freq: Two times a day (BID) | CUTANEOUS | 0 refills | Status: DC

## 2020-09-21 MED ORDER — FAMOTIDINE 20 MG PO TABS: 20.0000 mg | ORAL_TABLET | Freq: Two times a day (BID) | ORAL | 0 refills | Status: DC

## 2020-09-21 MED ORDER — PHENTERMINE HCL 37.5 MG PO TABS: ORAL_TABLET | ORAL | 0 refills | Status: DC

## 2020-09-21 MED ORDER — TOPIRAMATE 50 MG PO TABS: 25.0000 mg | ORAL_TABLET | Freq: Every day | ORAL | 1 refills | Status: DC

## 2020-09-21 MED ORDER — LEVOCETIRIZINE DIHYDROCHLORIDE 5 MG PO TABS: 5.0000 mg | ORAL_TABLET | Freq: Every evening | ORAL | 0 refills | Status: DC

## 2020-09-21 NOTE — Progress Notes (Signed)
HPI with pertinent ROS:   CC: Rash, weight check, mood follow-up  HPI: Pleasant 45 year old female presenting today for the following:  Rash-was recently seen about 4 weeks ago with reports of a vesicular rash to the left upper chest extending into the shoulder, left upper back, and neck.  She was prescribed a long taper of prednisone which produced resolution of the rash itself but did not resolve the burning and itching that she is feeling under the skin.  Shortly after finishing the prednisone, she had a resurgence of the rash in the same area.  She has tried topical triamcinolone but this was not helpful.  She is also taken Benadryl and used topical calamine which did not help.  The rash continues to burn and itch but it feels more like underneath the skin.  No fevers, chills, new exposures.  Weight check-started on one half tab phentermine about 4 weeks ago along with Topamax 25 mg daily.  She is tolerating both medications well without side effects.  Has been try to make better choices and notes that she is feeling satisfied all smaller portions.  Her weight has not changed much but her clothes are beginning to fit better.  Of note, she was on prednisone for 12 days out of this 4-week cycle.  Mood follow-up doing well on Cymbalta as prescribed and continues to take Elavil at night.  Notes that her mood swings have been doing better and she has had some positive life changes.  Her son is going to be getting married to a woman that she really likes and she will be getting 2 new grandchildren whom she adores.    I reviewed the past medical history, family history, social history, surgical history, and allergies today and no changes were needed.  Please see the problem list section below in epic for further details.   Physical exam:   General: Well Developed, well nourished, and in no acute distress.  Neuro: Alert and oriented x3.  HEENT: Normocephalic, atraumatic.  Skin: Warm and dry.   Scattered erythematous maculopapular rash with small intact scabs and several areas.  Affected area includes left upper chest, left shoulder, left neck, left upper back, and several small lesions in the scalp area.  No vesicles, pustules, exudate. Cardiac: Regular rate and rhythm, no murmurs rubs or gallops, no lower extremity edema.  Respiratory: Clear to auscultation bilaterally. Not using accessory muscles, speaking in full sentences.  Impression and Recommendations:    1. Anxiety and depression She is doing well at this point and the positive life changes have done wonderful for her.  Continue Cymbalta and Elavil as prescribed.  2. Rash Unclear etiology.  We could do a punch biopsy but we will hold off on this today.  Start famotidine 20 mg twice daily and Xyzal 5 mg twice daily for the next 2 weeks.  Sending in topical lidocaine for itching/burning.  Also sending in clobetasol as triamcinolone was not helpful but the rash was responsive to prednisone.  Use clobetasol twice daily for up to 14 days.  Referring to allergy for further evaluation. - Ambulatory referral to Allergy  3. Severe obesity with body mass index (BMI) of 35.0 to 39.9 with comorbidity ([place]) No significant weight loss in the past 4 weeks but extenuating circumstances may have contributed.  Cozaar starting to feel better so I feel it is working for her.  Continue phentermine one half tab daily.  Increasing Topamax to 50 mg daily as this has helped with  migraines and she still struggles with craving sweets and certain foods.  Incorporating physical activity several times weekly and work on dietary modification. - phentermine (ADIPEX-P) 37.5 MG tablet; Take 1/2 tablet by mouth daily.  Dispense: 30 tablet; Refill: 0  Return in about 4 weeks (around 10/19/2020) for weight check. ___________________________________________ Clearnce Sorrel, DNP, APRN, FNP-BC [facility] [place]

## 2020-09-22 ENCOUNTER — Encounter: Payer: Self-pay | Admitting: Medical-Surgical

## 2020-09-25 MED ORDER — TOPIRAMATE 50 MG PO TABS: 50.0000 mg | ORAL_TABLET | Freq: Every day | ORAL | 1 refills | Status: DC

## 2020-09-29 ENCOUNTER — Other Ambulatory Visit: Payer: Self-pay | Admitting: Medical-Surgical

## 2020-09-29 DIAGNOSIS — M7918 Myalgia, other site: Secondary | ICD-10-CM

## 2020-09-29 DIAGNOSIS — R252 Cramp and spasm: Secondary | ICD-10-CM

## 2020-09-29 DIAGNOSIS — M797 Fibromyalgia: Secondary | ICD-10-CM

## 2020-10-09 ENCOUNTER — Ambulatory Visit: Payer: 59 | Admitting: Internal Medicine

## 2020-10-09 NOTE — Progress Notes (Deleted)
NEW PATIENT Date of Service/Encounter:  10/09/20 Referring provider: Samuel Bouche, NP Primary care provider: Samuel Bouche, NP  Subjective:  Mary Cruz is a 45 y.o. female with a PMHx of *** presenting today for evaluation of rash History obtained from: chart review and {Persons; PED relatives w/patient:19415::"patient"}.   PCP notes regarding rash reviewed:  - rash described as vesicular (confirmed via photos in media) painful and stinging -treated as shingles initially with Valtrex x 7 days and prednisone - following prednisone, rash returned - referral to allergy placed Patient messages describing nerve pain  Other allergy screening: Asthma: {Blank single:19197::"yes","no"} Rhino conjunctivitis: {Blank single:19197::"yes","no"} Food allergy: {Blank single:19197::"yes","no"} Medication allergy: {Blank single:19197::"yes","no"} Hymenoptera allergy: {Blank single:19197::"yes","no"} Urticaria: {Blank single:19197::"yes","no"} Eczema:{Blank single:19197::"yes","no"} History of recurrent infections suggestive of immunodeficency: {Blank single:19197::"yes","no"} ***Vaccinations are up to date.   Past Medical History: Past Medical History:  Diagnosis Date   Abscess of axillary fold 10/10/2013   Acute bronchitis with asthma with acute exacerbation 01/23/2015   Dehydration 04/19/2019   Elevated lactic acid level 04/19/2019   Encounter for management of implanted device 12/19/2014   Encounter for therapeutic drug level monitoring 12/19/2014   Fibromyalgia    Hyperglycemia 04/19/2019   Hypertension    Implant pain at battery site (right buttocks area) 04/05/2015   KIDNEY STONES 07/02/2007   Qualifier: History of  By: Bobby Rumpf CMA (AAMA), Patty     Long term current use of opiate analgesic 12/19/2014   Lupus ([place])    Morbid obesity ([place]) 12/19/2014   Muscle cramping 12/19/2014   Opiate use (30 MME/Day) 12/19/2014   Osteoarthritis    Pain    chronic regional pain syndrome   Right knee  pain 01/10/2015   Sepsis ([place]) 04/19/2019   Sepsis affecting skin 10/10/2013   Tobacco abuse 04/19/2019   Medication List:  Current Outpatient Medications  Medication Sig Dispense Refill   amitriptyline (ELAVIL) 100 MG tablet Take 1 tablet (100 mg total) by mouth at bedtime. 90 tablet 1   clobetasol ointment (TEMOVATE) [id number] % Apply 1 application topically 2 (two) times daily. 30 g 0   clonazePAM (KLONOPIN) 0.5 MG tablet Take 0.5-1 tablets (0.25-0.5 mg total) by mouth 2 (two) times daily as needed for anxiety. 30 tablet 0   DULoxetine (CYMBALTA) 60 MG capsule Take 1 capsule (60 mg total) by mouth 2 (two) times daily. 180 capsule 3   famotidine (PEPCID) 20 MG tablet Take 1 tablet (20 mg total) by mouth 2 (two) times daily for 14 days. 28 tablet 0   hyoscyamine (LEVSIN SL) 0.125 MG SL tablet PLACE 1 TABLET (0.125 MG TOTAL) UNDER THE TONGUE DAILY AS NEEDED. 90 tablet 1   levocetirizine (XYZAL) 5 MG tablet Take 1 tablet (5 mg total) by mouth every evening for 14 days. 28 tablet 0   lidocaine (XYLOCAINE) 5 % ointment Apply 1 application topically daily. 2 hours before skin procedure. 50 g 11   lisinopril-hydrochlorothiazide (ZESTORETIC) 10-12.5 MG tablet Take 1 tablet by mouth daily. 90 tablet 3   methocarbamol (ROBAXIN) 750 MG tablet TAKE 1 TABLET (750 MG TOTAL) BY MOUTH EVERY 8 (EIGHT) HOURS AS NEEDED FOR MUSCLE SPASMS. 90 tablet 0   omeprazole (PRILOSEC) 40 MG capsule Take 1 capsule (40 mg total) by mouth daily. 90 capsule 3   oxyCODONE (OXY IR/ROXICODONE) 5 MG immediate release tablet Take 1 tablet (5 mg total) by mouth every 6 (six) hours as needed for severe pain. Must last 30 days 120 tablet 0   oxyCODONE (OXY IR/ROXICODONE)  5 MG immediate release tablet Take 1 tablet (5 mg total) by mouth every 6 (six) hours as needed for severe pain. Must last 30 days 120 tablet 0   oxyCODONE (OXY IR/ROXICODONE) 5 MG immediate release tablet Take 1 tablet (5 mg total) by mouth every 6 (six) hours as needed for  severe pain. Must last 30 days 120 tablet 0   phentermine (ADIPEX-P) 37.5 MG tablet Take 1/2 tablet by mouth daily. 30 tablet 0   Probiotic Product (ALIGN PO) Take by mouth daily.     rizatriptan (MAXALT) 10 MG tablet Take 1 tablet (10 mg total) by mouth as needed for migraine. May repeat in 2 hours if needed 10 tablet 12   topiramate (TOPAMAX) 50 MG tablet Take 1 tablet (50 mg total) by mouth daily. 30 tablet 1   No current facility-administered medications for this visit.   Known Allergies:  Allergies  Allergen Reactions   Celecoxib Swelling    "Body swelling"   Doxycycline Other (See Comments) and Nausea And Vomiting    "SICKNESS"   Gabapentin Swelling    Whole body swelling   Latex Itching and Swelling   Nabumetone Itching   Pentazocine Lactate Itching and Other (See Comments)    HALLUCINATIONS   Seroquel [Quetiapine] Other (See Comments)    Falls   Amoxicillin Diarrhea and Nausea And Vomiting   Bactrim [Sulfamethoxazole-Trimethoprim] Other (See Comments)    "Makes very sick"   Cephalexin Other (See Comments) and Hives    Nausea and causes uti's   Erythromycin Other (See Comments)    RESULTANT UTI   Hydromorphone Hcl Other (See Comments)    HALLUCINATIONS   Lyrica [Pregabalin] Other (See Comments)    "Too sleepy"   Past Surgical History: Past Surgical History:  Procedure Laterality Date   BREAST SURGERY     CHOLECYSTECTOMY  2000   ENDOMETRIAL ABLATION     KNEE SURGERY     KNEE SURGERY Right 02-2015   LAPAROSCOPIC APPENDECTOMY Right 01/01/2020   Procedure: APPENDECTOMY LAPAROSCOPIC;  Surgeon: Rolm Bookbinder, MD;  Location[facility];  Service: General;  Laterality: Right;   OCCIPITAL NERVE STIMULATOR INSERTION     SHOULDER ARTHROSCOPY WITH DISTAL CLAVICLE RESECTION Right 10/29/2018   Procedure: SHOULDER ARTHROSCOPY WITH DISTAL CLAVICLE RESECTION;  Surgeon: Hiram Gash, MD;  Location: [place];  Service: Orthopedics;  Laterality: Right;   SHOULDER  SURGERY     spinal cord stimulator     SPINAL CORD STIMULATOR IMPLANT  2008   SPINAL CORD STIMULATOR INSERTION N/A 06/21/2016   Procedure: LUMBAR SPINAL CORD STIMULATOR INSERTION;  Surgeon: Clydell Hakim, MD;  Location: [place];  Service: Neurosurgery;  Laterality: N/A;  LUMBAR SPINAL CORD STIMULATOR INSERTION   SUBACROMIAL DECOMPRESSION Right 10/29/2018   Procedure: SUBACROMIAL DECOMPRESSION;  Surgeon: Hiram Gash, MD;  Location: [place];  Service: Orthopedics;  Laterality: Right;   TUBAL LIGATION  2000   Family History: Family History  Problem Relation Age of Onset   Depression Mother        maternal grandmother   Asthma Mother    Fibromyalgia Other        aunt   Stroke Other        grandmother   Thyroid disease Other        grandmother   Social History: Shani lives ***.   ROS:  All other systems negative except as noted per HPI.  Objective:  There were no vitals taken for this visit. There  is no height or weight on file to calculate BMI. Physical Exam:  General Appearance:  Alert, cooperative, no distress, appears stated age  Head:  Normocephalic, without obvious abnormality, atraumatic  Eyes:  Conjunctiva clear, EOM's intact  Nose: Nares normal  Throat: Lips, tongue normal; teeth and gums normal  Neck: Supple, symmetrical  Lungs:   Respirations unlabored, no coughing  Heart:  Appears well perfused  Extremities: No edema  Skin: Skin color, texture, turgor normal, no rashes or lesions on visualized portions of skin  Neurologic: No gross deficits   Diagnostics: Spirometry:  Tracings reviewed. Her effort: {Blank single:19197::"Good reproducible efforts.","It was hard to get consistent efforts and there is a question as to whether this reflects a maximal maneuver.","Poor effort, data can not be interpreted."} FVC: ***L FEV1: ***L, ***% predicted FEV1/FVC ratio: ***% Interpretation: {Blank single:19197::"Spirometry consistent with mild obstructive  disease","Spirometry consistent with moderate obstructive disease","Spirometry consistent with severe obstructive disease","Spirometry consistent with possible restrictive disease","Spirometry consistent with mixed obstructive and restrictive disease","Spirometry uninterpretable due to technique","Spirometry consistent with normal pattern","No overt abnormalities noted given today's efforts"}.  Please see scanned spirometry results for details.  Skin Testing: {Blank single:19197::"Select foods","Environmental allergy panel","Environmental allergy panel and select foods","Food allergy panel","None","Deferred due to recent antihistamines use"}. Positive test to: ***. Negative test to: ***.  Results discussed with patient/family.   {Blank single:19197::"Allergy testing results were read and interpreted by myself, documented by clinical staff."," "}  Assessment:  No diagnosis found. Plan/Recommendations:  There are no Patient Instructions on file for this visit.  No follow-ups on file.  {Blank single:19197::"This note in its entirety was forwarded to the Provider who requested this consultation."}  Thank you for your kind referral. I appreciate the opportunity to take part in Dinisha's care. Please do not hesitate to contact me with questions.***  Sincerely,  Sigurd Sos, MD [facility]

## 2020-10-11 ENCOUNTER — Other Ambulatory Visit: Payer: Self-pay | Admitting: Medical-Surgical

## 2020-10-19 ENCOUNTER — Ambulatory Visit: Payer: 59 | Admitting: Medical-Surgical

## 2020-10-19 DIAGNOSIS — R7303 Prediabetes: Secondary | ICD-10-CM | POA: Insufficient documentation

## 2020-10-19 DIAGNOSIS — Z7689 Persons encountering health services in other specified circumstances: Secondary | ICD-10-CM

## 2020-10-19 HISTORY — DX: Prediabetes: R73.03

## 2020-10-19 NOTE — Progress Notes (Signed)
Erroneous encounter. Please disregard.

## 2020-10-23 ENCOUNTER — Other Ambulatory Visit: Payer: Self-pay | Admitting: Family Medicine

## 2020-10-27 ENCOUNTER — Other Ambulatory Visit: Payer: Self-pay | Admitting: Medical-Surgical

## 2020-10-27 DIAGNOSIS — R252 Cramp and spasm: Secondary | ICD-10-CM

## 2020-10-27 DIAGNOSIS — G8929 Other chronic pain: Secondary | ICD-10-CM

## 2020-10-27 DIAGNOSIS — M797 Fibromyalgia: Secondary | ICD-10-CM

## 2020-10-27 MED ORDER — METHOCARBAMOL 750 MG PO TABS: 750.0000 mg | ORAL_TABLET | Freq: Three times a day (TID) | ORAL | 1 refills | Status: DC | PRN

## 2020-10-27 MED ORDER — LEVOCETIRIZINE DIHYDROCHLORIDE 5 MG PO TABS: 5.0000 mg | ORAL_TABLET | Freq: Every evening | ORAL | 0 refills | Status: DC

## 2020-10-28 NOTE — Progress Notes (Signed)
PROVIDER NOTE: Information contained herein reflects review and annotations entered in association with encounter. Interpretation of such information and data should be left to medically-trained personnel. Information provided to patient can be located elsewhere in the medical record under "Patient Instructions". Document created using STT-dictation technology, any transcriptional errors that may result from process are unintentional.    Patient: Mary Cruz  Service Category: E/M  Provider: Gaspar Cola, MD  DOB: 11-28-1975  DOS: 10/30/2020  Specialty: Interventional Pain Management  MRN: [id number]  Setting: Ambulatory outpatient  PCP: Samuel Bouche, NP  Type: Established Patient    Referring Provider: Samuel Bouche, NP  Location: Office  Delivery: Face-to-face     HPI  [name], a 45 y.o. year old female, is here today because of her Chronic pain of both feet [M79.671, G89.29, M79.672]. [name] primary complain today is Back Pain (lower) and Hip Pain (rightt) Last encounter: My last encounter with her was on 07/31/2020. Pertinent problems: Ms. Arch has Chronic pain syndrome; Chronic musculoskeletal pain; Osteoarthrosis; Presence of functional implant ([employer]tr[employer] Lumbar spinal cord stimulator implant); Chronic low back pain (Bilateral) (R>L) w/o sciatica; Chronic lumbar radicular pain (Right L5 dermatome; Left S1 Dermatome) (Bilateral) (R>L); Chronic neck pain (1ry area of Pain) (Bilateral) (L>R); Chronic cervical radicular pain (C7 Dermatome) (Bilateral) (L>R); Chronic lower extremity pain (Bilateral) (R>L); Lumbar spondylosis; Lumbar facet syndrome (Bilateral) (R>L); Neurogenic pain; Fibromyalgia; Pain in right knee; Chronic upper extremity pain (2ry area of Pain) (Bilateral) (L>R); Chronic hip pain (3ry area of Pain) (Bilateral) (R>L); Closed fracture of scapula, sequela (Right); DDD (degenerative disc disease), cervical; Cervicalgia; Abnormal MRI, shoulder (Right);  Spondylosis without myelopathy or radiculopathy, lumbosacral region; DDD (degenerative disc disease), lumbar; Osteoarthritis involving multiple joints; Chronic low back pain (Right) w/ sciatica (Right); Chronic lower extremity pain (Right); Lumbosacral radiculopathy at L5 (Right); Dropfoot (Right); Lumbar facet arthropathy; Intractable low back pain; Closed fracture of multiple ribs of right side; and Chronic feet pain (Bilateral) on their pertinent problem list. Pain Assessment: Severity of Chronic pain is reported as a 4 /10. Location: Back Lower/back of both legs to knee area, tingling and numbness in both feet. Onset: More than a month ago. Quality: Aching, Shooting, Stabbing. Timing: Intermittent. Modifying factor(s): sitting, Oxycodone. Vitals:  height is _0  (1.626 m) and weight is 246 lb (111.6 kg). Her temporal temperature is 97 F (36.1 C) (abnormal). Her blood pressure is 134/86 and her pulse is 111 (abnormal). Her respiration is 20 and oxygen saturation is 99%.   Reason for encounter: medication management.   The patient indicates doing well with the current medication regimen. No adverse reactions or side effects reported to the medications.  The patient refers having experienced a really bad fall from her horse on March 26 that led to several broken ribs (3-9) on the right side.  In addition, she ended up with a significant hematoma of her right hip and when she hit the pavement apparently she fractured some vertebral bodies.  This has led to an increase in her bilateral low back pain with the right side being worse than the left.  She has also been experiencing tingling in both of her feet.  This has been happening for the past month.  She refers that this tingling sensation is not accompanied by pain.  She also indicates that the only thing that it seems to be associated with Mary Cruz back pain.  She refers that when her back flares up, this tingling sensation in her feet  follows.  At this point  she is having quite a bit of low back pain and she has requested to have the lumbar facet blocks repeated.  We will go ahead and plan on doing that and we will take the opportunity to also see if the feet symptoms go away while the local anesthetic is in place.  RTCB: 02/03/2021 Nonopioids transferred 04/06/2020: Robaxin  Pharmacotherapy Assessment  Analgesic: Oxycodone IR 5 mg, 1 tab PO q 6 hrs (20 mg/day of oxycodone) MME/day: 30 mg/day.   Monitoring: Mary Cruz PMP: PDMP reviewed during this encounter.       Pharmacotherapy: No side-effects or adverse reactions reported. Compliance: No problems identified. Effectiveness: Clinically acceptable.  Mary Martins, RN  10/30/2020 12:54 PM  Sign when Signing Visit Nursing Pain Medication Assessment:  Safety precautions to be maintained throughout the outpatient stay will include: orient to surroundings, keep bed in low position, maintain call bell within reach at all times, provide assistance with transfer out of bed and ambulation.  Medication Inspection Compliance: Pill count conducted under aseptic conditions, in front of the patient. Neither the pills nor the bottle was removed from the patient's sight at any time. Once count was completed pills were immediately returned to the patient in their original bottle.  Medication: Oxycodone IR Pill/Patch Count:  91 of 120 pills remain Pill/Patch Appearance: Markings consistent with prescribed medication Bottle Appearance: Standard pharmacy container. Clearly labeled. Filled Date: 09 /11 / 2022 Last Medication intake:  Yesterday    UDS:  Summary  Date Value Ref Range Status  07/31/2020 Note  Final    Comment:    ==================================================================== [employer] [facility] (MW) ==================================================================== Test                             Result       Flag       Units  Drug Present and Declared for Prescription Verification    Oxycodone                      1766         EXPECTED   ng/mg creat   Oxymorphone                    1326         EXPECTED   ng/mg creat   Noroxycodone                   2915         EXPECTED   ng/mg creat   Noroxymorphone                 492          EXPECTED   ng/mg creat    Sources of oxycodone are scheduled prescription medications.    Oxymorphone, noroxycodone, and noroxymorphone are expected    metabolites of oxycodone. Oxymorphone is also available as a    scheduled prescription medication.  Drug Absent but Declared for Prescription Verification   Clonazepam                     Not Detected UNEXPECTED ng/mg creat ==================================================================== Test                      Result    Flag   Units      Ref Range   Creatinine  205              mg/dL      >=20 ==================================================================== Declared Medications:  The flagging and interpretation on this report are based on the  following declared medications.  Unexpected results may arise from  inaccuracies in the declared medications.   **Note: The testing scope of this panel includes these medications:   Clonazepam (Klonopin)  Oxycodone (Roxicodone)   **Note: The testing scope of this panel does not include the  following reported medications:   Amitriptyline (Elavil)  Hydrochlorothiazide (Zestoretic)  Hyoscyamine  Lisinopril (Zestoretic)  Methocarbamol (Robaxin)  Omeprazole (Prilosec)  Rizatriptan (Maxalt) ==================================================================== For clinical consultation, please call [phone]. ====================================================================      ROS  Constitutional: Denies any fever or chills Gastrointestinal: No reported hemesis, hematochezia, vomiting, or acute GI distress Musculoskeletal: Denies any acute onset joint swelling, redness, loss of ROM, or weakness Neurological: No  reported episodes of acute onset apraxia, aphasia, dysarthria, agnosia, amnesia, paralysis, loss of coordination, or loss of consciousness  Medication Review  DULoxetine, Probiotic Product, amitriptyline, clobetasol ointment, clonazePAM, diazepam, famotidine, hyoscyamine, levocetirizine, lidocaine, lisinopril-hydrochlorothiazide, methocarbamol, omeprazole, oxyCODONE, phentermine, rizatriptan, and topiramate  History Review  Allergy: Mary Cruz is allergic to celecoxib, doxycycline, gabapentin, latex, nabumetone, pentazocine lactate, seroquel [quetiapine], amoxicillin, bactrim [sulfamethoxazole-trimethoprim], cephalexin, erythromycin, hydromorphone hcl, and lyrica [pregabalin]. Drug: Mary Cruz  reports no history of drug use. Alcohol:  reports no history of alcohol use. Tobacco:  reports that she has quit smoking. Her smoking use included cigarettes. She quit smokeless tobacco use about 2 years ago. Social: Mary Cruz  reports that she has quit smoking. Her smoking use included cigarettes. She quit smokeless tobacco use about 2 years ago. She reports that she does not drink alcohol and does not use drugs. Medical:  has a past medical history of Abscess of axillary fold (10/10/2013), Acute bronchitis with asthma with acute exacerbation (01/23/2015), Dehydration (04/19/2019), Elevated lactic acid level (04/19/2019), Encounter for management of implanted device (12/19/2014), Encounter for therapeutic drug level monitoring (12/19/2014), Fibromyalgia, Hyperglycemia (04/19/2019), Hypertension, Implant pain at battery site (right buttocks area) (04/05/2015), KIDNEY STONES (07/02/2007), Long term current use of opiate analgesic (12/19/2014), Lupus ([place]), Morbid obesity ([place]) (12/19/2014), Muscle cramping (12/19/2014), Opiate use (30 MME/Day) (12/19/2014), Osteoarthritis, Pain, Right knee pain (01/10/2015), Sepsis ([place]) (04/19/2019), Sepsis affecting skin (10/10/2013), and Tobacco abuse (04/19/2019). Surgical: Mary Cruz  has a  past surgical history that includes spinal cord stimulator; Occipital nerve stimulator insertion; Cholecystectomy (2000); Knee surgery; Shoulder surgery; Tubal ligation (2000); Endometrial ablation; Spinal cord stimulator implant (2008); Knee surgery (Right, 02-2015); Breast surgery; Spinal cord stimulator insertion (N/A, 06/21/2016); Shoulder arthroscopy with distal clavicle resection (Right, 10/29/2018); Subacromial decompression (Right, 10/29/2018); and laparoscopic appendectomy (Right, 01/01/2020). Family: family history includes Asthma in her mother; Depression in her mother; Fibromyalgia in an other family member; Stroke in an other family member; Thyroid disease in an other family member.  Laboratory Chemistry Profile   Renal Lab Results  Component Value Date   BUN 10 08/03/2020   CREATININE 0.95 08/03/2020   LABCREA 47.20 38/18/4037   BCR NOT APPLICABLE 54/36/0677   GFRAA 84 08/03/2020   GFRNONAA 73 08/03/2020    Hepatic Lab Results  Component Value Date   AST 18 08/03/2020   ALT 17 08/03/2020   ALBUMIN 2.7 (L) 04/20/2019   ALKPHOS 88 04/20/2019   LIPASE 23 04/19/2019    Electrolytes Lab Results  Component Value Date   NA 139 08/03/2020   K 3.8 08/03/2020   CL  100 08/03/2020   CALCIUM 9.8 08/03/2020   MG 2.0 04/20/2019   PHOS 3.2 04/20/2019    Bone Lab Results  Component Value Date   VD25OH 25 (L) 10/24/2016    Inflammation (CRP: Acute Phase) (ESR: Chronic Phase) Lab Results  Component Value Date   CRP 1.0 (H) 04/05/2015   ESRSEDRATE 35 (H) 04/05/2015   LATICACIDVEN 1.8 04/20/2019         Note: Above Lab results reviewed.  Recent Imaging Review  DG Lumbar Spine Complete CLINICAL DATA:  Right hip pain. Paresthesias of right hip into groin. Low back pain. Injury March 26, follow-up force.  EXAM: LUMBAR SPINE - COMPLETE 4+ VIEW  COMPARISON:  Lumbar spine radiograph 04/26/2018  FINDINGS: There are 5 non-rib-bearing lumbar vertebra. Trace retrolisthesis  of L4 on L5, unchanged from prior exam. Vertebral body heights are normal. No evidence of fracture. Posterior elements are intact. Disc spaces are preserved. Minimal endplate spurring at J6-G8. Spinal stimulator in place. The sacroiliac joints are congruent.  IMPRESSION: 1. No acute or healing fracture of the lumbar spine. 2. Trace retrolisthesis of L4 on L5, unchanged from prior exam. Minimal degenerative endplate spurring at Z6-O2.  Electronically Signed   By: Keith Rake M.D.   On: 07/01/2020 02:05 Note: Reviewed        Physical Exam  General appearance: Well nourished, well developed, and well hydrated. In no apparent acute distress Mental status: Alert, oriented x 3 (person, place, & time)       Respiratory: No evidence of acute respiratory distress Eyes: PERLA Vitals: BP 134/86   Pulse (!) 111   Temp (!) 97 F (36.1 C) (Temporal)   Resp 20   Ht _0  (1.626 m)   Wt 246 lb (111.6 kg)   SpO2 99%   BMI 42.23 kg/m  BMI: Estimated body mass index is 42.23 kg/m as calculated from the following:   Height as of this encounter: _1  (1.626 m).   Weight as of this encounter: 246 lb (111.6 kg). Ideal: Ideal body weight: 54.7 kg (120 lb 9.5 oz) Adjusted ideal body weight: 77.5 kg (170 lb 12.1 oz)  Assessment   Status Diagnosis  Controlled Controlled Controlled 1. Chronic feet pain (Bilateral)   2. Chronic lower extremity pain (Bilateral) (R>L)   3. Chronic neck pain (1ry area of Pain) (Bilateral) (L>R)   4. Chronic upper extremity pain (2ry area of Pain) (Bilateral) (L>R)   5. Chronic hip pain (3ry area of Pain) (Bilateral) (R>L)   6. Intractable low back pain   7. Lumbar facet syndrome (Bilateral) (R>L)   8. Neurogenic pain   9. Fibromyalgia   10. Presence of functional implant (Medtronic Lumbar spinal cord stimulator implant)   11. Chronic pain syndrome   12. Pharmacologic therapy   13. Chronic use of opiate for therapeutic purpose   14. Encounter for  medication management   15. Anxiety due to invasive procedure   16. Lumbar facet arthropathy   17. Chronic low back pain (Bilateral) (R>L) w/o sciatica      Updated Problems: Problem  Chronic feet pain (Bilateral)    Plan of Care  Problem-specific:  No problem-specific Assessment & Plan notes found for this encounter.  Mary Cruz has a current medication list which includes the following long-term medication(s): amitriptyline, clonazepam, duloxetine, hyoscyamine, levocetirizine, lisinopril-hydrochlorothiazide, methocarbamol, omeprazole, [START ON 11/05/2020] oxycodone, [START ON 12/05/2020] oxycodone, [START ON 01/04/2021] oxycodone, phentermine, rizatriptan, topiramate, and famotidine.  Pharmacotherapy (Medications Ordered): Meds ordered this  encounter  Medications   oxyCODONE (OXY IR/ROXICODONE) 5 MG immediate release tablet    Sig: Take 1 tablet (5 mg total) by mouth every 6 (six) hours as needed for severe pain. Must last 30 days    Dispense:  120 tablet    Refill:  0    Not a duplicate. Do NOT delete! Dispense 1 day early if closed on refill date. Avoid benzodiazepines within 8 hours of opioids. Do not send refill requests.   oxyCODONE (OXY IR/ROXICODONE) 5 MG immediate release tablet    Sig: Take 1 tablet (5 mg total) by mouth every 6 (six) hours as needed for severe pain. Must last 30 days    Dispense:  120 tablet    Refill:  0    Not a duplicate. Do NOT delete! Dispense 1 day early if closed on refill date. Avoid benzodiazepines within 8 hours of opioids. Do not send refill requests.   oxyCODONE (OXY IR/ROXICODONE) 5 MG immediate release tablet    Sig: Take 1 tablet (5 mg total) by mouth every 6 (six) hours as needed for severe pain. Must last 30 days    Dispense:  120 tablet    Refill:  0    Not a duplicate. Do NOT delete! Dispense 1 day early if closed on refill date. Avoid benzodiazepines within 8 hours of opioids. Do not send refill requests.   diazepam  (VALIUM) 5 MG tablet    Sig: Take 1-2 tablets (5-10 mg total) by mouth 60 (sixty) minutes before procedure for 2 doses. Take one (1) tab (5 mg) 60 minutes before scheduled procedure. Wait 30 minutes. If still anxious, take the second (5 mg) tab 30 min before procedure.    Dispense:  2 tablet    Refill:  0    Must have a driver. Do not drive or operate machinery x 24 hours after taking this medication. Avoid taking within 4 hours of having taken an opioid pain medications.    Orders:  Orders Placed This Encounter  Procedures   LUMBAR FACET(MEDIAL BRANCH NERVE BLOCK) MBNB    Standing Status:   Future    Standing Expiration Date:   01/29/2021    Scheduling Instructions:     Procedure: Lumbar facet block (AKA.: Lumbosacral medial branch nerve block)     Side: Bilateral     Level: L3-4, L4-5, & L5-S1 Facets (L2, L3, L4, L5, & S1 Medial Branch Nerves)     Sedation: Patient's choice.     Timeframe: ASAA    Order Specific Question:   Where will this procedure be performed?    Answer:   [facility]   Informed Consent Details: Physician/Practitioner Attestation; Transcribe to consent form and obtain patient signature    Nursing Order: Transcribe to consent form and obtain patient signature. Note: Always confirm laterality of pain with Mary Cruz, before procedure.    Order Specific Question:   Physician/Practitioner attestation of informed consent for procedure/surgical case    Answer:   I, the physician/practitioner, attest that I have discussed with the patient the benefits, risks, side effects, alternatives, likelihood of achieving goals and potential problems during recovery for the procedure that I have provided informed consent.    Order Specific Question:   Procedure    Answer:   Lumbar Facet Block  under fluoroscopic guidance    Order Specific Question:   Physician/Practitioner performing the procedure    Answer:   Shelita Steptoe A. Dossie Arbour MD    Order Specific Question:  Indication/Reason    Answer:   Low Back Pain, with our without leg pain, due to Facet Joint Arthralgia (Joint Pain) Spondylosis (Arthritis of the Spine), without myelopathy or radiculopathy (Nerve Damage).    Follow-up plan:   Return for Proc-day (T,Th), (Clinic) procedure: (B) L-FCT BLK, ValiumRx-given.     Interventional treatment options:  Under consideration:   Diagnostic right suprascapular nerve block #1  Repeat therapeutic bilateral lumbar facet RFA    Therapeutic/palliative (PRN):   Palliative bilateral lumbar facet blocks  Palliative right lumbar facet RFA #3 (05/13/2019)  Palliative left lumbar facet RFA #3 (04/08/2019)  Palliative left C7-T1 CESI  PRN SCS programming adjustments (2018 replacement by Dr. Clydell Hakim)      Recent Visits No visits were found meeting these conditions. Showing recent visits within past 90 days and meeting all other requirements Today's Visits Date Type Provider Dept  10/30/20 Office Visit Milinda Pointer, MD [facility]  Showing today's visits and meeting all other requirements Future Appointments No visits were found meeting these conditions. Showing future appointments within next 90 days and meeting all other requirements I discussed the assessment and treatment plan with the patient. The patient was provided an opportunity to ask questions and all were answered. The patient agreed with the plan and demonstrated an understanding of the instructions.  Patient advised to call back or seek an in-person evaluation if the symptoms or condition worsens.  Duration of encounter: 30 minutes.  Note by: Gaspar Cola, MD Date: 10/30/2020; Time: 1:30 PM

## 2020-10-30 ENCOUNTER — Encounter: Payer: Self-pay | Admitting: Pain Medicine

## 2020-10-30 ENCOUNTER — Other Ambulatory Visit: Payer: Self-pay

## 2020-10-30 ENCOUNTER — Ambulatory Visit: Payer: 59 | Attending: Pain Medicine | Admitting: Pain Medicine

## 2020-10-30 VITALS — BP 134/86 | HR 111 | Temp 97.0°F | Resp 20 | Ht 64.0 in | Wt 246.0 lb

## 2020-10-30 DIAGNOSIS — M79672 Pain in left foot: Secondary | ICD-10-CM | POA: Insufficient documentation

## 2020-10-30 DIAGNOSIS — M79602 Pain in left arm: Secondary | ICD-10-CM | POA: Insufficient documentation

## 2020-10-30 DIAGNOSIS — G894 Chronic pain syndrome: Secondary | ICD-10-CM | POA: Diagnosis present

## 2020-10-30 DIAGNOSIS — M25551 Pain in right hip: Secondary | ICD-10-CM | POA: Diagnosis present

## 2020-10-30 DIAGNOSIS — G8929 Other chronic pain: Secondary | ICD-10-CM

## 2020-10-30 DIAGNOSIS — M79605 Pain in left leg: Secondary | ICD-10-CM | POA: Insufficient documentation

## 2020-10-30 DIAGNOSIS — M79671 Pain in right foot: Secondary | ICD-10-CM | POA: Diagnosis present

## 2020-10-30 DIAGNOSIS — M47816 Spondylosis without myelopathy or radiculopathy, lumbar region: Secondary | ICD-10-CM | POA: Insufficient documentation

## 2020-10-30 DIAGNOSIS — M542 Cervicalgia: Secondary | ICD-10-CM | POA: Insufficient documentation

## 2020-10-30 DIAGNOSIS — M79601 Pain in right arm: Secondary | ICD-10-CM | POA: Diagnosis present

## 2020-10-30 DIAGNOSIS — Z969 Presence of functional implant, unspecified: Secondary | ICD-10-CM | POA: Diagnosis present

## 2020-10-30 DIAGNOSIS — F419 Anxiety disorder, unspecified: Secondary | ICD-10-CM | POA: Insufficient documentation

## 2020-10-30 DIAGNOSIS — Z79891 Long term (current) use of opiate analgesic: Secondary | ICD-10-CM | POA: Diagnosis present

## 2020-10-30 DIAGNOSIS — M5459 Other low back pain: Secondary | ICD-10-CM | POA: Insufficient documentation

## 2020-10-30 DIAGNOSIS — M545 Low back pain, unspecified: Secondary | ICD-10-CM | POA: Diagnosis present

## 2020-10-30 DIAGNOSIS — M79604 Pain in right leg: Secondary | ICD-10-CM | POA: Insufficient documentation

## 2020-10-30 DIAGNOSIS — M797 Fibromyalgia: Secondary | ICD-10-CM | POA: Insufficient documentation

## 2020-10-30 DIAGNOSIS — Z79899 Other long term (current) drug therapy: Secondary | ICD-10-CM | POA: Diagnosis present

## 2020-10-30 DIAGNOSIS — M25552 Pain in left hip: Secondary | ICD-10-CM | POA: Insufficient documentation

## 2020-10-30 DIAGNOSIS — M792 Neuralgia and neuritis, unspecified: Secondary | ICD-10-CM | POA: Diagnosis present

## 2020-10-30 HISTORY — DX: Other chronic pain: G89.29

## 2020-10-30 MED ORDER — OXYCODONE HCL 5 MG PO TABS: 5.0000 mg | ORAL_TABLET | Freq: Four times a day (QID) | ORAL | 0 refills | Status: DC | PRN

## 2020-10-30 MED ORDER — DIAZEPAM 5 MG PO TABS: 5.0000 mg | ORAL_TABLET | ORAL | 0 refills | Status: DC

## 2020-10-30 NOTE — Progress Notes (Signed)
Nursing Pain Medication Assessment:  Safety precautions to be maintained throughout the outpatient stay will include: orient to surroundings, keep bed in low position, maintain call bell within reach at all times, provide assistance with transfer out of bed and ambulation.  Medication Inspection Compliance: Pill count conducted under aseptic conditions, in front of the patient. Neither the pills nor the bottle was removed from the patient's sight at any time. Once count was completed pills were immediately returned to the patient in their original bottle.  Medication: Oxycodone IR Pill/Patch Count:  91 of 120 pills remain Pill/Patch Appearance: Markings consistent with prescribed medication Bottle Appearance: Standard pharmacy container. Clearly labeled. Filled Date: 09 /11 / 2022 Last Medication intake:  Mary Cruz

## 2020-10-30 NOTE — Patient Instructions (Signed)
______________________________________________________________________  Preparing for Procedure with Oral Anxiolytics  Definition: Anxiolytics - Medications that provide muscle relaxation and decrease anxiety.  Procedure appointments are limited to planned procedures: No Prescription Refills. No disability issues will be discussed. No medication changes will be discussed.  Instructions: Oral Intake: Do not eat or drink anything for at least 6 hours prior to your procedure. (Exception: Blood Pressure Medication. See below.)  Anxiolytic Medication: This medication is meant to relax you during your procedure. DO NOT DRIVE OR OPERATE DANGEROUS MACHINERY WHILE UNDER THE INFLUENCE OF THIS MEDICATION. Take the oral medication (i.e.: Valium) as prescribed on the day of your procedure with just a sip of water. Prescription will be sent to your pharmacy, prior to the day of your procedure. Topical anesthetic: Your physician might have recommended a topical anesthetic/analgesic to apply over the area where the procedure will be done. If so, apply to area 1 hour prior to procedure. For exact location of application, ask your healthcare provider.    Transportation: A driver is required. You may not drive yourself after the procedure. Blood Pressure Medicine: Do not forget to take your blood pressure medicine with a sip of water the morning of the procedure. If your Diastolic (lower reading) is above 100 mmHg, elective cases will be cancelled/rescheduled. Blood thinners: These will need to be stopped for procedures. Notify our staff if you are taking any blood thinners. Depending on which one you take, there will be specific instructions on how and when to stop it. Diabetics on insulin: Notify the staff so that you can be scheduled 1st case in the morning. If your diabetes requires high dose insulin, take only  of your normal insulin dose the morning of the procedure and notify the staff that you have done  so. Preventing infections: Shower with an antibacterial soap the morning of your procedure. Build-up your immune system: Take 1000 mg of Vitamin C with every meal (3 times a day) the day prior to your procedure. Antibiotics: Inform the staff if you have a condition or reason that requires you to take antibiotics before dental procedures. Pregnancy: If you are pregnant, call and cancel the procedure. Sickness: If you have a cold, fever, or any active infections, call and cancel the procedure. Arrival: You must be in the facility at least 30 minutes prior to your scheduled procedure. Children: Do not bring children with you. Dress appropriately: Bring dark clothing that you would not mind if they get stained. Valuables: Do not bring any jewelry or valuables.  Reasons to call and reschedule or cancel your procedure:  NOTE: Following these recommendations will minimize the risk of a serious complication. Surgeries: Avoid having procedures within 2 weeks of any surgery. (Avoid for 2 weeks before or after any surgery). Flu Shots: Avoid having procedures within 2 weeks of a flu shots. (Avoid for 2 weeks before or after immunizations). Barium: Avoid having a procedure within 7-10 days after having had a radiological study involving the use of radiological contrast. (Myelograms, Barium swallow or enema study). Heart attacks: Avoid any elective procedures or surgeries for the initial 6 months after a "Myocardial Infarction" (Heart Attack). Blood thinners: It is imperative that you stop these medications before procedures. Let us know if you if you take any blood thinner.  Infection: Avoid procedures during or within two weeks of an infection (including chest colds or gastrointestinal problems). Symptoms associated with infections include: Localized redness, fever, chills, night sweats or profuse sweating, burning sensation when voiding, cough, congestion,   stuffiness, runny nose, sore throat, diarrhea,  nausea, vomiting, cold or Flu symptoms, recent or current infections. It is especially important if the infection is over the area that we intend to treat. Heart and lung problems: Symptoms that may suggest an active cardiopulmonary problem include: cough, chest pain, breathing difficulties or shortness of breath, dizziness, ankle swelling, uncontrolled high or unusually low blood pressure, and/or palpitations. If you are experiencing any of these symptoms, cancel your procedure and contact your primary care physician for an evaluation.  Remember:  Regular Business hours are:  Monday to Thursday 8:00 AM to 4:00 PM  Provider's Schedule: Jayna Mulnix, MD:  Procedure days: Tuesday and Thursday 7:30 AM to 4:00 PM  Bilal Lateef, MD:  Procedure days: Monday and Wednesday 7:30 AM to 4:00 PM ______________________________________________________________________  ____________________________________________________________________________________________  General Risks and Possible Complications  Patient Responsibilities: It is important that you read this as it is part of your informed consent. It is our duty to inform you of the risks and possible complications associated with treatments offered to you. It is your responsibility as a patient to read this and to ask questions about anything that is not clear or that you believe was not covered in this document.  Patient's Rights: You have the right to refuse treatment. You also have the right to change your mind, even after initially having agreed to have the treatment done. However, under this last option, if you wait until the last second to change your mind, you may be charged for the materials used up to that point.  Introduction: Medicine is not an exact science. Everything in Medicine, including the lack of treatment(s), carries the potential for danger, harm, or loss (which is by definition: Risk). In Medicine, a complication is a secondary  problem, condition, or disease that can aggravate an already existing one. All treatments carry the risk of possible complications. The fact that a side effects or complications occurs, does not imply that the treatment was conducted incorrectly. It must be clearly understood that these can happen even when everything is done following the highest safety standards.  No treatment: You can choose not to proceed with the proposed treatment alternative. The "PRO(s)" would include: avoiding the risk of complications associated with the therapy. The "CON(s)" would include: not getting any of the treatment benefits. These benefits fall under one of three categories: diagnostic; therapeutic; and/or palliative. Diagnostic benefits include: getting information which can ultimately lead to improvement of the disease or symptom(s). Therapeutic benefits are those associated with the successful treatment of the disease. Finally, palliative benefits are those related to the decrease of the primary symptoms, without necessarily curing the condition (example: decreasing the pain from a flare-up of a chronic condition, such as incurable terminal cancer).  General Risks and Complications: These are associated to most interventional treatments. They can occur alone, or in combination. They fall under one of the following six (6) categories: no benefit or worsening of symptoms; bleeding; infection; nerve damage; allergic reactions; and/or death. No benefits or worsening of symptoms: In Medicine there are no guarantees, only probabilities. No healthcare provider can ever guarantee that a medical treatment will work, they can only state the probability that it may. Furthermore, there is always the possibility that the condition may worsen, either directly, or indirectly, as a consequence of the treatment. Bleeding: This is more common if the patient is taking a blood thinner, either prescription or over the counter (example: Goody  Powders, Fish oil, Aspirin, Garlic, etc.),   or if suffering a condition associated with impaired coagulation (example: Hemophilia, cirrhosis of the liver, low platelet counts, etc.). However, even if you do not have one on these, it can still happen. If you have any of these conditions, or take one of these drugs, make sure to notify your treating physician. Infection: This is more common in patients with a compromised immune system, either due to disease (example: diabetes, cancer, human immunodeficiency virus [HIV], etc.), or due to medications or treatments (example: therapies used to treat cancer and rheumatological diseases). However, even if you do not have one on these, it can still happen. If you have any of these conditions, or take one of these drugs, make sure to notify your treating physician. Nerve Damage: This is more common when the treatment is an invasive one, but it can also happen with the use of medications, such as those used in the treatment of cancer. The damage can occur to small secondary nerves, or to large primary ones, such as those in the spinal cord and brain. This damage may be temporary or permanent and it may lead to impairments that can range from temporary numbness to permanent paralysis and/or brain death. Allergic Reactions: Any time a substance or material comes in contact with our body, there is the possibility of an allergic reaction. These can range from a mild skin rash (contact dermatitis) to a severe systemic reaction (anaphylactic reaction), which can result in death. Death: In general, any medical intervention can result in death, most of the time due to an unforeseen complication. ____________________________________________________________________________________________  

## 2020-11-15 NOTE — Progress Notes (Signed)
PROVIDER NOTE: Information contained herein reflects review and annotations entered in association with encounter. Interpretation of such information and data should be left to medically-trained personnel. Information provided to patient can be located elsewhere in the medical record under "Patient Instructions". Document created using STT-dictation technology, any transcriptional errors that may result from process are unintentional.    Patient: Mary Cruz  Service Category: Procedure  Provider: Gaspar Cola, MD  DOB: [birth year]  DOS: 11/16/2020  Location: [facility]  MRN: [id number]  Setting: Ambulatory - outpatient  Referring Provider: Samuel Bouche, NP  Type: Established Patient  Specialty: Interventional Pain Management  PCP: Samuel Bouche, NP   Primary Reason for Visit: Interventional Pain Management Treatment. CC: No chief complaint on file.   Procedure:          Anesthesia, Analgesia, Anxiolysis:  Type: Lumbar Facet, Medial Branch Block(s)          Primary Purpose: Diagnostic Region: Posterolateral Lumbosacral Spine Level: L2, L3, L4, L5, & S1 Medial Branch Level(s). Injecting these levels blocks the L3-4, L4-5, and L5-S1 lumbar facet joints. Laterality: Bilateral  Type: Local Anesthesia Local Anesthetic: Lidocaine 1-2% Sedation: Minimal Anxiolysis  Indication(s): Anxiety & Analgesia Route: Infiltration (Mazie/IM) IV Access: N/A   Position: Prone   Indications: 1. Lumbar facet syndrome (Bilateral) (R>L)   2. Spondylosis without myelopathy or radiculopathy, lumbosacral region   3. Lumbar facet arthropathy   4. DDD (degenerative disc disease), lumbar   5. Chronic low back pain (Bilateral) (R>L) w/o sciatica   6. Latex precautions, history of latex allergy    Pain Score: Pre-procedure: 4 /10 Post-procedure: 0-No pain/10     Pre-op H&P Assessment:  Mary Cruz is a 45 y.o. (year old), female patient, seen today for interventional treatment. She  has a  past surgical history that includes spinal cord stimulator; Occipital nerve stimulator insertion; Cholecystectomy (2000); Knee surgery; Shoulder surgery; Tubal ligation (2000); Endometrial ablation; Spinal cord stimulator implant (2008); Knee surgery (Right, 02-2015); Breast surgery; Spinal cord stimulator insertion (N/A, 06/21/2016); Shoulder arthroscopy with distal clavicle resection (Right, 10/29/2018); Subacromial decompression (Right, 10/29/2018); and laparoscopic appendectomy (Right, 01/01/2020). Mary Cruz has a current medication list which includes the following prescription(s): amitriptyline, clobetasol ointment, clonazepam, diazepam, duloxetine, famotidine, hyoscyamine, levocetirizine, lidocaine, lisinopril-hydrochlorothiazide, methocarbamol, omeprazole, [START ON 12/05/2020] oxycodone, [START ON 01/04/2021] oxycodone, [START ON 02/03/2021] oxycodone, phentermine, probiotic product, rizatriptan, and topiramate. Her primarily concern today is the No chief complaint on file.  Initial Vital Signs:  Pulse/HCG Rate: (!) 101ECG Heart Rate: 87 Temp: (!) 96.5 F (35.8 C) Resp: 16 BP: (!) 141/80 SpO2: 99 %  BMI: Estimated body mass index is 42.23 kg/m as calculated from the following:   Height as of this encounter: 5\' 4"  (1.626 m).   Weight as of this encounter: 246 lb (111.6 kg).  Risk Assessment: Allergies: Reviewed. She is allergic to celecoxib, doxycycline, gabapentin, latex, nabumetone, pentazocine lactate, seroquel [quetiapine], amoxicillin, bactrim [sulfamethoxazole-trimethoprim], cephalexin, erythromycin, hydromorphone hcl, and lyrica [pregabalin].  Allergy Precautions: None required Coagulopathies: Reviewed. None identified.  Blood-thinner therapy: None at this time Active Infection(s): Reviewed. None identified. Mary Cruz is afebrile  Site Confirmation: Mary Cruz was asked to confirm the procedure and laterality before marking the site Procedure checklist: Completed Consent:  Before the procedure and under the influence of no sedative(s), amnesic(s), or anxiolytics, the patient was informed of the treatment options, risks and possible complications. To fulfill our ethical and legal obligations, as recommended by the American Medical Association's Code of Ethics, I  have informed the patient of my clinical impression; the nature and purpose of the treatment or procedure; the risks, benefits, and possible complications of the intervention; the alternatives, including doing nothing; the risk(s) and benefit(s) of the alternative treatment(s) or procedure(s); and the risk(s) and benefit(s) of doing nothing. The patient was provided information about the general risks and possible complications associated with the procedure. These may include, but are not limited to: failure to achieve desired goals, infection, bleeding, organ or nerve damage, allergic reactions, paralysis, and death. In addition, the patient was informed of those risks and complications associated to Spine-related procedures, such as failure to decrease pain; infection (i.e.: Meningitis, epidural or intraspinal abscess); bleeding (i.e.: epidural hematoma, subarachnoid hemorrhage, or any other type of intraspinal or peri-dural bleeding); organ or nerve damage (i.e.: Any type of peripheral nerve, nerve root, or spinal cord injury) with subsequent damage to sensory, motor, and/or autonomic systems, resulting in permanent pain, numbness, and/or weakness of one or several areas of the body; allergic reactions; (i.e.: anaphylactic reaction); and/or death. Furthermore, the patient was informed of those risks and complications associated with the medications. These include, but are not limited to: allergic reactions (i.e.: anaphylactic or anaphylactoid reaction(s)); adrenal axis suppression; blood sugar elevation that in diabetics may result in ketoacidosis or comma; water retention that in patients with history of congestive heart  failure may result in shortness of breath, pulmonary edema, and decompensation with resultant heart failure; weight gain; swelling or edema; medication-induced neural toxicity; particulate matter embolism and blood vessel occlusion with resultant organ, and/or nervous system infarction; and/or aseptic necrosis of one or more joints. Finally, the patient was informed that Medicine is not an exact science; therefore, there is also the possibility of unforeseen or unpredictable risks and/or possible complications that may result in a catastrophic outcome. The patient indicated having understood very clearly. We have given the patient no guarantees and we have made no promises. Enough time was given to the patient to ask questions, all of which were answered to the patient's satisfaction. Ms. Burd has indicated that she wanted to continue with the procedure. Attestation: I, the ordering provider, attest that I have discussed with the patient the benefits, risks, side-effects, alternatives, likelihood of achieving goals, and potential problems during recovery for the procedure that I have provided informed consent. Date  Time: 11/16/2020 11:05 AM  Pre-Procedure Preparation:  Monitoring: As per clinic protocol. Respiration, ETCO2, SpO2, BP, heart rate and rhythm monitor placed and checked for adequate function Safety Precautions: Patient was assessed for positional comfort and pressure points before starting the procedure. Time-out: I initiated and conducted the "Time-out" before starting the procedure, as per protocol. The patient was asked to participate by confirming the accuracy of the "Time Out" information. Verification of the correct person, site, and procedure were performed and confirmed by me, the nursing staff, and the patient. "Time-out" conducted as per Joint Commission's Universal Protocol (UP.01.01.01). Time: 1126  Description of Procedure:          Laterality: Bilateral. The procedure was  performed in identical fashion on both sides. Levels:  L2, L3, L4, L5, & S1 Medial Branch Level(s) Area Prepped: Posterior Lumbosacral Region DuraPrep (Iodine Povacrylex [0.7% available iodine] and Isopropyl Alcohol, 74% w/w) Safety Precautions: Aspiration looking for blood return was conducted prior to all injections. At no point did we inject any substances, as a needle was being advanced. Before injecting, the patient was told to immediately notify me if she was experiencing any new onset  of "ringing in the ears, or metallic taste in the mouth". No attempts were made at seeking any paresthesias. Safe injection practices and needle disposal techniques used. Medications properly checked for expiration dates. SDV (single dose vial) medications used. After the completion of the procedure, all disposable equipment used was discarded in the proper designated medical waste containers. Local Anesthesia: Protocol guidelines were followed. The patient was positioned over the fluoroscopy table. The area was prepped in the usual manner. The time-out was completed. The target area was identified using fluoroscopy. A 12-in long, straight, sterile hemostat was used with fluoroscopic guidance to locate the targets for each level blocked. Once located, the skin was marked with an approved surgical skin marker. Once all sites were marked, the skin (epidermis, dermis, and hypodermis), as well as deeper tissues (fat, connective tissue and muscle) were infiltrated with a small amount of a short-acting local anesthetic, loaded on a 10cc syringe with a 25G, 1.5-in  Needle. An appropriate amount of time was allowed for local anesthetics to take effect before proceeding to the next step. Local Anesthetic: Lidocaine 2.0% The unused portion of the local anesthetic was discarded in the proper designated containers. Technical explanation of process:  L2 Medial Branch Nerve Block (MBB): The target area for the L2 medial branch is at  the junction of the postero-lateral aspect of the superior articular process and the superior, posterior, and medial edge of the transverse process of L3. Under fluoroscopic guidance, a Quincke needle was inserted until contact was made with os over the superior postero-lateral aspect of the pedicular shadow (target area). After negative aspiration for blood, 0.5 mL of the nerve block solution was injected without difficulty or complication. The needle was removed intact. L3 Medial Branch Nerve Block (MBB): The target area for the L3 medial branch is at the junction of the postero-lateral aspect of the superior articular process and the superior, posterior, and medial edge of the transverse process of L4. Under fluoroscopic guidance, a Quincke needle was inserted until contact was made with os over the superior postero-lateral aspect of the pedicular shadow (target area). After negative aspiration for blood, 0.5 mL of the nerve block solution was injected without difficulty or complication. The needle was removed intact. L4 Medial Branch Nerve Block (MBB): The target area for the L4 medial branch is at the junction of the postero-lateral aspect of the superior articular process and the superior, posterior, and medial edge of the transverse process of L5. Under fluoroscopic guidance, a Quincke needle was inserted until contact was made with os over the superior postero-lateral aspect of the pedicular shadow (target area). After negative aspiration for blood, 0.5 mL of the nerve block solution was injected without difficulty or complication. The needle was removed intact. L5 Medial Branch Nerve Block (MBB): The target area for the L5 medial branch is at the junction of the postero-lateral aspect of the superior articular process and the superior, posterior, and medial edge of the sacral ala. Under fluoroscopic guidance, a Quincke needle was inserted until contact was made with os over the superior postero-lateral  aspect of the pedicular shadow (target area). After negative aspiration for blood, 0.5 mL of the nerve block solution was injected without difficulty or complication. The needle was removed intact. S1 Medial Branch Nerve Block (MBB): The target area for the S1 medial branch is at the posterior and inferior 6 o'clock position of the L5-S1 facet joint. Under fluoroscopic guidance, the Quincke needle inserted for the L5 MBB was redirected  until contact was made with os over the inferior and postero aspect of the sacrum, at the 6 o' clock position under the L5-S1 facet joint (Target area). After negative aspiration for blood, 0.5 mL of the nerve block solution was injected without difficulty or complication. The needle was removed intact.  Nerve block solution: 0.2% PF-Ropivacaine + Triamcinolone (40 mg/mL) diluted to a final concentration of 4 mg of Triamcinolone/mL of Ropivacaine The unused portion of the solution was discarded in the proper designated containers. Procedural Needles: 22-gauge, 3.5-inch, Quincke needles used for all levels.  Once the entire procedure was completed, the treated area was cleaned, making sure to leave some of the prepping solution back to take advantage of its long term bactericidal properties.      Illustration of the posterior view of the lumbar spine and the posterior neural structures. Laminae of L2 through S1 are labeled. DPRL5, dorsal primary ramus of L5; DPRS1, dorsal primary ramus of S1; DPR3, dorsal primary ramus of L3; FJ, facet (zygapophyseal) joint L3-L4; I, inferior articular process of L4; LB1, lateral branch of dorsal primary ramus of L1; IAB, inferior articular branches from L3 medial branch (supplies L4-L5 facet joint); IBP, intermediate branch plexus; MB3, medial branch of dorsal primary ramus of L3; NR3, third lumbar nerve root; S, superior articular process of L5; SAB, superior articular branches from L4 (supplies L4-5 facet joint also); TP3, transverse  process of L3.  Vitals:   11/16/20 1104 11/16/20 1130 11/16/20 1135 11/16/20 1137  BP: (!) 141/80 130/70 118/78 123/74  Pulse: (!) 101     Resp: 16 13 13 11   Temp: (!) 96.5 F (35.8 C)     TempSrc: Temporal     SpO2: 99% 98% 97% 98%  Weight: 246 lb (111.6 kg)     Height: 5\' 4"  (1.626 m)        Start Time: 1126 hrs. End Time: 1137 hrs.  Imaging Guidance (Spinal):          Type of Imaging Technique: Fluoroscopy Guidance (Spinal) Indication(s): Assistance in needle guidance and placement for procedures requiring needle placement in or near specific anatomical locations not easily accessible without such assistance. Exposure Time: Please see nurses notes. Contrast: None used. Fluoroscopic Guidance: I was personally present during the use of fluoroscopy. "Tunnel Vision Technique" used to obtain the best possible view of the target area. Parallax error corrected before commencing the procedure. "Direction-depth-direction" technique used to introduce the needle under continuous pulsed fluoroscopy. Once target was reached, antero-posterior, oblique, and lateral fluoroscopic projection used confirm needle placement in all planes. Images permanently stored in EMR. Interpretation: No contrast injected. I personally interpreted the imaging intraoperatively. Adequate needle placement confirmed in multiple planes. Permanent images saved into the patient's record.  Antibiotic Prophylaxis:   Anti-infectives (From admission, onward)    None      Indication(s): None identified  Post-operative Assessment:  Post-procedure Vital Signs:  Pulse/HCG Rate: (!) 10185 Temp: (!) 96.5 F (35.8 C) Resp: 11 BP: 123/74 SpO2: 98 %  EBL: None  Complications: No immediate post-treatment complications observed by team, or reported by patient.  Note: The patient tolerated the entire procedure well. A repeat set of vitals were taken after the procedure and the patient was kept under observation following  institutional policy, for this type of procedure. Post-procedural neurological assessment was performed, showing return to baseline, prior to discharge. The patient was provided with post-procedure discharge instructions, including a section on how to identify potential problems. Should any problems arise  concerning this procedure, the patient was given instructions to immediately contact us, at any time, without hesitation. In any case, we plan to contact the patient by telephone for a follow-up status report regarding this interventional procedure.  Comments:  No additional relevant information.  Plan of Care  Orders:  Orders Placed This Encounter  Procedures   LUMBAR FACET(MEDIAL BRANCH NERVE BLOCK) MBNB    Scheduling Instructions:     Procedure: Lumbar facet block (AKA.: Lumbosacral medial branch nerve block)     Side: Bilateral     Level: L3-4, L4-5, & L5-S1 Facets (L2, L3, L4, L5, & S1 Medial Branch Nerves)     Sedation: Patient's choice.     Timeframe: Today    Order Specific Question:   Where will this procedure be performed?    Answer:   [facility]   DG PA[facility] C-ARM 1-60 MIN NO REPORT    Intraoperative interpretation by procedural physician at [place].    Standing Status:   Standing    Number of Occurrences:   1    Order Specific Question:   Reason for exam:    Answer:   Assistance in needle guidance and placement for procedures requiring needle placement in or near specific anatomical locations not easily accessible without such assistance.   Informed Consent Details: Physician/Practitioner Attestation; Transcribe to consent form and obtain patient signature    Nursing Order: Transcribe to consent form and obtain patient signature. Note: Always confirm laterality of pain with Ms. Makarewicz, before procedure.    Order Specific Question:   Physician/Practitioner attestation of informed consent for procedure/surgical case    Answer:   I, the  physician/practitioner, attest that I have discussed with the patient the benefits, risks, side effects, alternatives, likelihood of achieving goals and potential problems during recovery for the procedure that I have provided informed consent.    Order Specific Question:   Procedure    Answer:   Lumbar Facet Block  under fluoroscopic guidance    Order Specific Question:   Physician/Practitioner performing the procedure    Answer:   Tanna Loeffler A. Dossie Arbour MD    Order Specific Question:   Indication/Reason    Answer:   Low Back Pain, with our without leg pain, due to Facet Joint Arthralgia (Joint Pain) Spondylosis (Arthritis of the Spine), without myelopathy or radiculopathy (Nerve Damage).   Provide equipment / supplies at bedside    "Block Tray" (Disposable  single use) Needle type: SpinalSpinal Amount/quantity: 4 Size: Medium (5-inch) Gauge: 22G    Standing Status:   Standing    Number of Occurrences:   1    Order Specific Question:   Specify    Answer:   Block Tray   Latex precautions    Activate Latex-Free Protocol.    Standing Status:   Standing    Number of Occurrences:   1    Chronic Opioid Analgesic:  Oxycodone IR 5 mg, 1 tab PO q 6 hrs (20 mg/day of oxycodone) MME/day: 30 mg/day.   Medications ordered for procedure: Meds ordered this encounter  Medications   lidocaine (XYLOCAINE) 2 % (with pres) injection 400 mg   pentafluoroprop-tetrafluoroeth (GEBAUERS) aerosol   ropivacaine (PF) 2 mg/mL (0.2%) (NAROPIN) injection 18 mL   triamcinolone acetonide (KENALOG-40) injection 80 mg   oxyCODONE (OXY IR/ROXICODONE) 5 MG immediate release tablet    Sig: Take 1 tablet (5 mg total) by mouth every 6 (six) hours as needed for severe pain. Must last 30 days  Dispense:  120 tablet    Refill:  0    DO NOT: delete (not duplicate); no partial-fill (will deny script to complete), no refill request (F/U required). DISPENSE: 1 day early if closed on fill date. WARN: No CNS-depressants  within 8 hrs of med.    Medications administered: We administered lidocaine, pentafluoroprop-tetrafluoroeth, ropivacaine (PF) 2 mg/mL (0.2%), and triamcinolone acetonide.  See the medical record for exact dosing, route, and time of administration.  Follow-up plan:   Return in about 2 weeks (around 11/30/2020) for Proc-day (T,Th), (VV), (PPE).       Interventional treatment options:  Under consideration:   Diagnostic right suprascapular nerve block #1  Repeat therapeutic bilateral lumbar facet RFA    Therapeutic/palliative (PRN):   Palliative bilateral lumbar facet blocks  Palliative right lumbar facet RFA #3 (05/13/2019)  Palliative left lumbar facet RFA #3 (04/08/2019)  Palliative left C7-T1 CESI  PRN SCS programming adjustments (2018 replacement by Dr. Clydell Hakim)       Recent Visits Date Type Provider Dept  10/30/20 Office Visit Milinda Pointer, MD [facility]  Showing recent visits within past 90 days and meeting all other requirements Today's Visits Date Type Provider Dept  11/16/20 Procedure visit Milinda Pointer, MD [facility]  Showing today's visits and meeting all other requirements Future Appointments Date Type Provider Dept  12/05/20 Appointment Milinda Pointer, MD [facility]  Showing future appointments within next 90 days and meeting all other requirements Disposition: Discharge home  Discharge (Date  Time): 11/16/2020;   hrs.   Primary Care Physician: Samuel Bouche, NP Location: [facility] Note by: Gaspar Cola, MD Date: 11/16/2020; Time: 2:08 PM  Disclaimer:  Medicine is not an Chief Strategy Officer. The only guarantee in medicine is that nothing is guaranteed. It is important to note that the decision to proceed with this intervention was based on the information collected from the patient. The Data and conclusions were drawn from the patient's questionnaire, the interview, and the physical  examination. Because the information was provided in large part by the patient, it cannot be guaranteed that it has not been purposely or unconsciously manipulated. Every effort has been made to obtain as much relevant data as possible for this evaluation. It is important to note that the conclusions that lead to this procedure are derived in large part from the available data. Always take into account that the treatment will also be dependent on availability of resources and existing treatment guidelines, considered by other Pain Management Practitioners as being common knowledge and practice, at the time of the intervention. For Medico-Legal purposes, it is also important to point out that variation in procedural techniques and pharmacological choices are the acceptable norm. The indications, contraindications, technique, and results of the above procedure should only be interpreted and judged by a Board-Certified Interventional Pain Specialist with extensive familiarity and expertise in the same exact procedure and technique.

## 2020-11-16 ENCOUNTER — Encounter: Payer: Self-pay | Admitting: Pain Medicine

## 2020-11-16 ENCOUNTER — Ambulatory Visit (HOSPITAL_BASED_OUTPATIENT_CLINIC_OR_DEPARTMENT_OTHER): Payer: 59 | Admitting: Pain Medicine

## 2020-11-16 ENCOUNTER — Other Ambulatory Visit: Payer: Self-pay

## 2020-11-16 ENCOUNTER — Ambulatory Visit
Admission: RE | Admit: 2020-11-16 | Discharge: 2020-11-16 | Disposition: A | Discharge status: Home / Self Care | Payer: 59 | Source: Ambulatory Visit | Attending: Pain Medicine | Admitting: Pain Medicine

## 2020-11-16 VITALS — BP 123/74 | HR 101 | Temp 96.5°F | Resp 11 | Ht 64.0 in | Wt 246.0 lb

## 2020-11-16 DIAGNOSIS — M5136 Other intervertebral disc degeneration, lumbar region: Secondary | ICD-10-CM | POA: Insufficient documentation

## 2020-11-16 DIAGNOSIS — Z91040 Latex allergy status: Secondary | ICD-10-CM | POA: Diagnosis not present

## 2020-11-16 DIAGNOSIS — Z79899 Other long term (current) drug therapy: Secondary | ICD-10-CM

## 2020-11-16 DIAGNOSIS — M47816 Spondylosis without myelopathy or radiculopathy, lumbar region: Secondary | ICD-10-CM

## 2020-11-16 DIAGNOSIS — M47817 Spondylosis without myelopathy or radiculopathy, lumbosacral region: Secondary | ICD-10-CM

## 2020-11-16 DIAGNOSIS — Z79891 Long term (current) use of opiate analgesic: Secondary | ICD-10-CM

## 2020-11-16 DIAGNOSIS — M545 Low back pain, unspecified: Secondary | ICD-10-CM | POA: Diagnosis not present

## 2020-11-16 DIAGNOSIS — G894 Chronic pain syndrome: Secondary | ICD-10-CM

## 2020-11-16 DIAGNOSIS — G8929 Other chronic pain: Secondary | ICD-10-CM | POA: Diagnosis present

## 2020-11-16 DIAGNOSIS — Z969 Presence of functional implant, unspecified: Secondary | ICD-10-CM

## 2020-11-16 DIAGNOSIS — M5459 Other low back pain: Secondary | ICD-10-CM

## 2020-11-16 DIAGNOSIS — M797 Fibromyalgia: Secondary | ICD-10-CM

## 2020-11-16 DIAGNOSIS — M542 Cervicalgia: Secondary | ICD-10-CM

## 2020-11-16 MED ORDER — LIDOCAINE HCL 2 % IJ SOLN
INTRAMUSCULAR | Status: AC
  Filled 2020-11-16: qty 20

## 2020-11-16 MED ORDER — OXYCODONE HCL 5 MG PO TABS: 5.0000 mg | ORAL_TABLET | Freq: Four times a day (QID) | ORAL | 0 refills | Status: DC | PRN

## 2020-11-16 MED ORDER — ROPIVACAINE HCL 2 MG/ML IJ SOLN
18.0000 mL | Freq: Once | INTRAMUSCULAR | Status: AC
  Administered 2020-11-16: 20 mL via PERINEURAL

## 2020-11-16 MED ORDER — ROPIVACAINE HCL 2 MG/ML IJ SOLN
INTRAMUSCULAR | Status: AC
  Filled 2020-11-16: qty 20

## 2020-11-16 MED ORDER — TRIAMCINOLONE ACETONIDE 40 MG/ML IJ SUSP
INTRAMUSCULAR | Status: AC
  Filled 2020-11-16: qty 2

## 2020-11-16 MED ORDER — PENTAFLUOROPROP-TETRAFLUOROETH EX AERO
INHALATION_SPRAY | Freq: Once | CUTANEOUS | Status: AC
  Administered 2020-11-16: 30 via TOPICAL
  Filled 2020-11-16: qty 116

## 2020-11-16 MED ORDER — LIDOCAINE HCL 2 % IJ SOLN
20.0000 mL | Freq: Once | INTRAMUSCULAR | Status: AC
  Administered 2020-11-16: 400 mg

## 2020-11-16 MED ORDER — TRIAMCINOLONE ACETONIDE 40 MG/ML IJ SUSP
80.0000 mg | Freq: Once | INTRAMUSCULAR | Status: AC
  Administered 2020-11-16: 40 mg

## 2020-11-16 NOTE — Patient Instructions (Signed)

## 2020-11-16 NOTE — Progress Notes (Signed)
Safety precautions to be maintained throughout the outpatient stay will include: orient to surroundings, keep bed in low position, maintain call bell within reach at all times, provide assistance with transfer out of bed and ambulation.  

## 2020-11-17 ENCOUNTER — Other Ambulatory Visit: Payer: Self-pay | Admitting: Medical-Surgical

## 2020-11-17 ENCOUNTER — Telehealth: Payer: Self-pay | Admitting: *Deleted

## 2020-11-17 NOTE — Telephone Encounter (Signed)
No problems post procedure. 

## 2020-11-20 ENCOUNTER — Ambulatory Visit (INDEPENDENT_AMBULATORY_CARE_PROVIDER_SITE_OTHER): Payer: 59 | Admitting: Medical-Surgical

## 2020-11-20 ENCOUNTER — Other Ambulatory Visit: Payer: Self-pay

## 2020-11-20 ENCOUNTER — Encounter: Payer: Self-pay | Admitting: Medical-Surgical

## 2020-11-20 VITALS — BP 129/85 | HR 82 | Resp 20 | Ht 64.0 in | Wt 245.3 lb

## 2020-11-20 DIAGNOSIS — M791 Myalgia, unspecified site: Secondary | ICD-10-CM

## 2020-11-20 DIAGNOSIS — R21 Rash and other nonspecific skin eruption: Secondary | ICD-10-CM

## 2020-11-20 DIAGNOSIS — M2550 Pain in unspecified joint: Secondary | ICD-10-CM

## 2020-11-20 DIAGNOSIS — L568 Other specified acute skin changes due to ultraviolet radiation: Secondary | ICD-10-CM

## 2020-11-20 DIAGNOSIS — R768 Other specified abnormal immunological findings in serum: Secondary | ICD-10-CM | POA: Diagnosis not present

## 2020-11-20 DIAGNOSIS — G8929 Other chronic pain: Secondary | ICD-10-CM

## 2020-11-20 DIAGNOSIS — M7918 Myalgia, other site: Secondary | ICD-10-CM

## 2020-11-20 DIAGNOSIS — R7689 Other specified abnormal immunological findings in serum: Secondary | ICD-10-CM

## 2020-11-20 DIAGNOSIS — R252 Cramp and spasm: Secondary | ICD-10-CM

## 2020-11-20 DIAGNOSIS — M797 Fibromyalgia: Secondary | ICD-10-CM

## 2020-11-20 MED ORDER — DULOXETINE HCL 60 MG PO CPEP
60.0000 mg | ORAL_CAPSULE | Freq: Two times a day (BID) | ORAL | 3 refills | Status: DC
Start: 2020-11-20 — End: 2021-07-23

## 2020-11-20 MED ORDER — METHOCARBAMOL 750 MG PO TABS: 750.0000 mg | ORAL_TABLET | Freq: Three times a day (TID) | ORAL | 1 refills | Status: DC | PRN

## 2020-11-20 MED ORDER — LEVOCETIRIZINE DIHYDROCHLORIDE 5 MG PO TABS: 5.0000 mg | ORAL_TABLET | Freq: Every evening | ORAL | 1 refills | Status: DC

## 2020-11-20 NOTE — Patient Instructions (Signed)
[facility] rheumatology at [phone] and [phone]

## 2020-11-20 NOTE — Progress Notes (Signed)
  HPI with pertinent ROS:   CC: ? Autoimmune disease/lupus  HPI: Pleasant 45 year old female presenting today with concerns for possible autoimmune disorder.  Notes that she was out camping recently when she developed significant swelling and rash to her hands, bilateral upper extremities, face, and legs.  They originally thought she had gotten bitten by an insect but the rash continued to spread.  She has pictures with her showing the extent of the rash.  No new medications, foods, cosmetics, detergents, or chemicals.  Reports knowing that her ANA was positive several years ago and wonders if she may have developed lupus or some other autoimmune disorder that would be responsible for her recent symptoms.  Her rash and swelling has now resolved.  She does continue to have intermittent itching in the area of the rash along her left upper chest but feels that that is related to hay allergy.  Denies fever, chills, shortness of breath, chest pain, and GI symptoms.  Would like to have a refill on Cymbalta, Xyzal, and methocarbamol today.  All 3 meds are doing well without side effects.  I reviewed the past medical history, family history, social history, surgical history, and allergies today and no changes were needed.  Please see the problem list section below in epic for further details.   Physical exam:   General: Well Developed, well nourished, and in no acute distress.  Neuro: Alert and oriented x3.  HEENT: Normocephalic, atraumatic.  Skin: Warm and dry. Cardiac: Regular rate and rhythm, no murmurs rubs or gallops, no lower extremity edema.  Respiratory: Clear to auscultation bilaterally. Not using accessory muscles, speaking in full sentences.  Impression and Recommendations:    1. Positive ANA (antinuclear antibody) 2. Myalgia 3. Rash 4. Photosensitivity dermatitis 5. Arthralgia, unspecified joint Unclear etiology for her photographed symptoms.  Consider contact dermatitis versus  photosensitivity (medication side effect).  Discussed the nature of a positive ANA.  She would like to proceed with further work-up so checking labs as below.  Referral has been entered for rheumatology and has been authorized.  Phone number of the practice provided to patient with AVS so she can reach out to them. - CK (Creatine Kinase) - Cyclic citrul peptide antibody, IgG - Rheumatoid factor - CBC with Differential/Platelet - COMPLETE METABOLIC PANEL WITH GFR - Serum protein electrophoresis with reflex - Antinuclear Antib (ANA) - Anti-DNA antibody, double-stranded - Lupus Anticoagulant and Antiphospholipid Confirmation w/Consultation - C3 and C4 - Sed Rate (ESR) - CRP High sensitivity - Urinalysis, Routine w reflex microscopic - Protein / creatinine ratio, urine  6. Fibromyalgia 7. Muscle cramping 8. Chronic musculoskeletal pain Continue methocarbamol and Cymbalta. - methocarbamol (ROBAXIN) 750 MG tablet; Take 1 tablet (750 mg total) by mouth every 8 (eight) hours as needed for muscle spasms.  Dispense: 270 tablet; Refill: 1   Return if symptoms worsen or fail to improve.  Further follow-up pending lab results. ___________________________________________ Clearnce Sorrel, DNP, APRN, FNP-BC [facility] [place]

## 2020-11-21 ENCOUNTER — Encounter: Payer: Self-pay | Admitting: Medical-Surgical

## 2020-11-29 ENCOUNTER — Encounter: Payer: Self-pay | Admitting: Medical-Surgical

## 2020-11-29 MED ORDER — FLUCONAZOLE 150 MG PO TABS: 150.0000 mg | ORAL_TABLET | Freq: Once | ORAL | 0 refills | Status: AC

## 2020-12-05 ENCOUNTER — Telehealth: Payer: 59 | Admitting: Pain Medicine

## 2020-12-08 LAB — ANTI-DNA ANTIBODY, DOUBLE-STRANDED: ds DNA Ab: 1 IU/mL

## 2020-12-08 LAB — CBC WITH DIFFERENTIAL/PLATELET
Absolute Monocytes: 806 cells/uL (ref 200–950)
Basophils Absolute: 37 cells/uL (ref 0–200)
Basophils Relative: 0.3 %
Eosinophils Absolute: 25 cells/uL (ref 15–500)
Eosinophils Relative: 0.2 %
HCT: 41.5 % (ref 35.0–45.0)
Hemoglobin: 13.3 g/dL (ref 11.7–15.5)
Lymphs Abs: 2691 cells/uL (ref 850–3900)
MCH: 29 pg (ref 27.0–33.0)
MCHC: 32 g/dL (ref 32.0–36.0)
MCV: 90.6 fL (ref 80.0–100.0)
MPV: 12.1 fL (ref 7.5–12.5)
Monocytes Relative: 6.5 %
Neutro Abs: 8841 cells/uL — ABNORMAL HIGH (ref 1500–7800)
Neutrophils Relative %: 71.3 %
Platelets: 317 10*3/uL (ref 140–400)
RBC: 4.58 10*6/uL (ref 3.80–5.10)
RDW: 13.2 % (ref 11.0–15.0)
Total Lymphocyte: 21.7 %
WBC: 12.4 10*3/uL — ABNORMAL HIGH (ref 3.8–10.8)

## 2020-12-08 LAB — URINALYSIS, ROUTINE W REFLEX MICROSCOPIC
Bilirubin Urine: NEGATIVE
Glucose, UA: NEGATIVE
Hgb urine dipstick: NEGATIVE
Ketones, ur: NEGATIVE
Leukocytes,Ua: NEGATIVE
Nitrite: NEGATIVE
Protein, ur: NEGATIVE
Specific Gravity, Urine: 1.016 (ref 1.001–1.035)
pH: 7 (ref 5.0–8.0)

## 2020-12-08 LAB — LUPUS ANTICOAGULANT AND ANTIPHOSPHOLIPID CONFIRM W/CONSULT
Anticardiolipin IgG: <2 GPL-U/mL (ref <20.0)
Anticardiolipin IgM: <2 MPL-U/mL (ref <20.0)
Beta-2 Glyco 1 IgM: 2.4 U/mL (ref <20.0)
Beta-2 Glyco I IgG: <2 U/mL (ref <20.0)
PTT-LA Screen: 35 s (ref <=40)
dRVVT: 40 s (ref <=45)

## 2020-12-08 LAB — C3 AND C4
C3 Complement: 161 mg/dL (ref 83–193)
C4 Complement: 27 mg/dL (ref 15–57)

## 2020-12-08 LAB — ANTI-NUCLEAR AB-TITER (ANA TITER): ANA Titer 1: 1:320 {titer} — ABNORMAL HIGH

## 2020-12-08 LAB — HIGH SENSITIVITY CRP: hs-CRP: 3.2 mg/L — ABNORMAL HIGH

## 2020-12-08 LAB — COMPLETE METABOLIC PANEL WITH GFR
AG Ratio: 1.3 (calc) (ref 1.0–2.5)
ALT: 14 U/L (ref 6–29)
AST: 10 U/L (ref 10–35)
Albumin: 4.4 g/dL (ref 3.6–5.1)
Alkaline phosphatase (APISO): 107 U/L (ref 31–125)
BUN: 17 mg/dL (ref 7–25)
CO2: 30 mmol/L (ref 20–32)
Calcium: 9.8 mg/dL (ref 8.6–10.2)
Chloride: 100 mmol/L (ref 98–110)
Creat: 0.94 mg/dL (ref 0.50–0.99)
Globulin: 3.3 g/dL (calc) (ref 1.9–3.7)
Glucose, Bld: 99 mg/dL (ref 65–99)
Potassium: 3.7 mmol/L (ref 3.5–5.3)
Sodium: 137 mmol/L (ref 135–146)
Total Bilirubin: 0.3 mg/dL (ref 0.2–1.2)
Total Protein: 7.7 g/dL (ref 6.1–8.1)
eGFR: 76 mL/min/{1.73_m2} (ref >=60)

## 2020-12-08 LAB — PROTEIN ELECTROPHORESIS, SERUM, WITH REFLEX
Albumin ELP: 4.1 g/dL (ref 3.8–4.8)
Alpha 1: 0.4 g/dL — ABNORMAL HIGH (ref 0.2–0.3)
Alpha 2: 0.8 g/dL (ref 0.5–0.9)
Beta 2: 0.5 g/dL (ref 0.2–0.5)
Beta Globulin: 0.6 g/dL (ref 0.4–0.6)
Gamma Globulin: 1.1 g/dL (ref 0.8–1.7)
Total Protein: 7.4 g/dL (ref 6.1–8.1)

## 2020-12-08 LAB — CYCLIC CITRUL PEPTIDE ANTIBODY, IGG: Cyclic Citrullin Peptide Ab: <16 UNITS

## 2020-12-08 LAB — PROTEIN / CREATININE RATIO, URINE
Creatinine, Urine: 51 mg/dL (ref 20–275)
Protein/Creat Ratio: 78 mg/g creat (ref 24–184)
Protein/Creatinine Ratio: 0.078 mg/mg creat (ref 0.024–0.184)
Total Protein, Urine: 4 mg/dL — ABNORMAL LOW (ref 5–24)

## 2020-12-08 LAB — RHEUMATOID FACTOR: Rheumatoid fact SerPl-aCnc: <14 IU/mL (ref <14)

## 2020-12-08 LAB — CK: Total CK: 66 U/L (ref 29–143)

## 2020-12-08 LAB — ANA: Anti Nuclear Antibody (ANA): POSITIVE — AB

## 2020-12-08 LAB — SEDIMENTATION RATE: Sed Rate: 28 mm/h — ABNORMAL HIGH (ref 0–20)

## 2020-12-27 ENCOUNTER — Encounter: Payer: Self-pay | Admitting: Pain Medicine

## 2020-12-27 NOTE — Progress Notes (Signed)
Patient: Mary Cruz  Service Category: E/M  Provider: Gaspar Cola, MD  DOB: [birth year]  DOS: 12/28/2020  Location: Office  MRN: [id number]  Setting: Ambulatory outpatient  Referring Provider: Samuel Bouche, NP  Type: Established Patient  Specialty: Interventional Pain Management  PCP: Samuel Bouche, NP  Location: Remote location  Delivery: TeleHealth     Virtual Encounter - Pain Management PROVIDER NOTE: Information contained herein reflects review and annotations entered in association with encounter. Interpretation of such information and data should be left to medically-trained personnel. Information provided to patient can be located elsewhere in the medical record under "Patient Instructions". Document created using STT-dictation technology, any transcriptional errors that may result from process are unintentional.    Contact & Pharmacy Preferred: [phone] Home: [phone] (home) Mobile: [phone] (mobile) E-mail: [email or web address]  [id number]: [phone]: [phone]  Pre-screening  Mary Cruz "in-person" vs "virtual" encounter. She indicated preferring virtual for this encounter.   Reason COVID-19*  Social distancing based on CDC and AMA recommendations.   I contacted Mary BOULEon 12/28/2020 via telephone.      I clearly identified myself as FGaspar Cola MD. I verified that I was speaking with the correct person using two identifiers (Name: Mary Cruz and date of birth: [birth year].  Consent I sought verbal advanced consent from Mary Cruz virtual visit interactions. I informed Ms. KBiggarof possible security and privacy concerns, risks, and limitations associated with providing "not-in-person" medical evaluation and management services. I also informed Ms. KKirchoffof the availability of  "in-person" appointments. Finally, I informed her that there would be a charge for the virtual visit and that she could be  personally, fully or partially, financially responsible for it. Ms. KBacornexpressed understanding and agreed to proceed.   Historic Elements   Ms. CEMILEE MARKETis a 45y.o. year old, female patient evaluated today after our last contact on 11/16/2020. Mary Cruz has a past medical history of Abscess of axillary fold (10/10/2013), Acute bronchitis with asthma with acute exacerbation (01/23/2015), Dehydration (04/19/2019), Elevated lactic acid level (04/19/2019), Encounter for management of implanted device (12/19/2014), Encounter for therapeutic drug level monitoring (12/19/2014), Fibromyalgia, Hyperglycemia (04/19/2019), Hypertension, Implant pain at battery site (right buttocks area) (04/05/2015), KIDNEY STONES (07/02/2007), Long term current use of opiate analgesic (12/19/2014), Lupus ([place], Morbid obesity ([place] (12/19/2014), Muscle cramping (12/19/2014), Opiate use (30 MME/Day) (12/19/2014), Osteoarthritis, Pain, Right knee pain (01/10/2015), [place]epsis ([place] (04/19/2019), Sepsis affecting skin (10/10/2013), and Tobacco abuse (04/19/2019). She also  has a past surgical history that includes spinal cord stimulator; Occipital nerve stimulator insertion; Cholecystectomy (2000); Knee surgery; Shoulder surgery; Tubal ligation (2000); Endometrial ablation; Spinal cord stimulator implant (2008); Knee surgery (Right, 02-2015); Breast surgery; Spinal cord stimulator insertion (N/A, 06/21/2016); Shoulder arthroscopy with distal clavicle resection (Right, 10/29/2018); Subacromial decompression (Right, 10/29/2018); and laparoscopic appendectomy (Right, 01/01/2020). Mary Cruz a current medication list which includes the following prescription(s): amitriptyline, clobetasol ointment, clonazepam, duloxetine, hyoscyamine, levocetirizine, lidocaine, lisinopril-hydrochlorothiazide, methocarbamol, omeprazole, oxycodone,  [START ON 01/04/2021] oxycodone, [START ON 02/03/2021] oxycodone, probiotic product, rizatriptan, topiramate, and famotidine. She  reports that she has quit smoking. Her smoking use included cigarettes. She quit smokeless tobacco use about 3 years ago. She reports that she does not drink alcohol and does not use drugs. Mary Cruz allergic to celecoxib, doxycycline, gabapentin, latex, nabumetone, pentazocine lactate, seroquel [quetiapine],  amoxicillin, bactrim [sulfamethoxazole-trimethoprim], cephalexin, erythromycin, hydromorphone hcl, and lyrica [pregabalin].   HPI  Today, she is being contacted for a post-procedure assessment.  The patient refers having attained 90 to 100% relief of the pain for the duration of the local anesthetic which then went down to an ongoing 85 to 90% relief.  She is really happy with the results and she believes that this particular block has worked really well for her.  She has enough medications to last until 03/05/2021 and she has a follow-up appointment with [place] on 02/28/2021.  For the time being she refers not needing anything else.  RTCB: 03/05/2021 Nonopioids transferred 04/06/2020: Robaxin  Post-Procedure Evaluation  Procedure (11/16/2020):  Procedure:           Anesthesia, Analgesia, Anxiolysis:  Type: Lumbar Facet, Medial Branch Block(s)          Primary Purpose: Diagnostic Region: Posterolateral Lumbosacral Spine Level: L2, L3, L4, L5, & S1 Medial Branch Level(s). Injecting these levels blocks the L3-4, L4-5, and L5-S1 lumbar facet joints. Laterality: Bilateral   Type: Local Anesthesia Local Anesthetic: Lidocaine 1-2% Sedation: Minimal Anxiolysis  Indication(s): Anxiety & Analgesia Route: Infiltration ([place]/IM) IV Access: N/A     Position: Prone    Indications: 1. Lumbar facet syndrome (Bilateral) (R>L)   2. Spondylosis without myelopathy or radiculopathy, lumbosacral region   3. Lumbar facet arthropathy   4. DDD (degenerative disc disease), lumbar    5. Chronic low back pain (Bilateral) (R>L) w/o sciatica   6. Latex precautions, history of latex allergy     Pain Score: Pre-procedure: 4 /10 Post-procedure: 0-No pain/10    Anxiolysis: Please see nurses note.  Effectiveness during initial hour after procedure (Ultra-Short Term Relief): 90 %.  Local anesthetic used: Long-acting (4-6 hours) Effectiveness: Defined as any analgesic benefit obtained secondary to the administration of local anesthetics. This carries significant diagnostic value as to the etiological location, or anatomical origin, of the pain. Duration of benefit is expected to coincide with the duration of the local anesthetic used.  Effectiveness during initial 4-6 hours after procedure (Short-Term Relief): 90 %.  Long-term benefit: Defined as any relief past the pharmacologic duration of the local anesthetics.  Effectiveness past the initial 6 hours after procedure (Long-Term Relief): 80 %.  Benefits, current: Defined as benefit present at the time of this evaluation.   Analgesia: The patient indicates currently having an ongoing 85 to 90% relief of her low back pain.  She refers that this particular block has worked the best so far. Function: Mary Cruz reports improvement in function ROM: Mary Cruz reports improvement in ROM  Pharmacotherapy Assessment   Analgesic: Oxycodone IR 5 mg, 1 tab PO q 6 hrs (20 mg/day of oxycodone) MME/day: 30 mg/day.   Monitoring: [place] PMP: PDMP reviewed during this encounter.       Pharmacotherapy: No side-effects or adverse reactions reported. Compliance: No problems identified. Effectiveness: Clinically acceptable. Plan: Refer to "POC". UDS:  Summary  Date Value Ref Range Status  07/31/2020 Note  Final    Comment:    ==================================================================== [employer] Select 13 (MW) ==================================================================== Test                             Result       Flag        Units  Drug Present and Declared for Prescription Verification   Oxycodone  1766         EXPECTED   ng/mg creat   Oxymorphone                    1326         EXPECTED   ng/mg creat   Noroxycodone                   2915         EXPECTED   ng/mg creat   Noroxymorphone                 492          EXPECTED   ng/mg creat    Sources of oxycodone are scheduled prescription medications.    Oxymorphone, noroxycodone, and noroxymorphone are expected    metabolites of oxycodone. Oxymorphone is also available as a    scheduled prescription medication.  Drug Absent but Declared for Prescription Verification   Clonazepam                     Not Detected UNEXPECTED ng/mg creat ==================================================================== Test                      Result    Flag   Units      Ref Range   Creatinine              205              mg/dL      >=20 ==================================================================== Declared Medications:  The flagging and interpretation on this report are based on the  following declared medications.  Unexpected results may arise from  inaccuracies in the declared medications.   **Note: The testing scope of this panel includes these medications:   Clonazepam (Klonopin)  Oxycodone (Roxicodone)   **Note: The testing scope of this panel does not include the  following reported medications:   Amitriptyline (Elavil)  Hydrochlorothiazide (Zestoretic)  Hyoscyamine  Lisinopril (Zestoretic)  Methocarbamol (Robaxin)  Omeprazole (Prilosec)  Rizatriptan (Maxalt) ==================================================================== For clinical consultation, please call [phone]. ====================================================================      Laboratory Chemistry Profile   Renal Lab Results  Component Value Date   BUN 17 11/20/2020   CREATININE 0.94 11/20/2020   LABCREA 51 46/56/8127   BCR NOT  APPLICABLE 51/70/0174   GFRAA 84 08/03/2020   GFRNONAA 73 08/03/2020    Hepatic Lab Results  Component Value Date   AST 10 11/20/2020   ALT 14 11/20/2020   ALBUMIN 2.7 (L) 04/20/2019   ALKPHOS 88 04/20/2019   LIPASE 23 04/19/2019    Electrolytes Lab Results  Component Value Date   NA 137 11/20/2020   K 3.7 11/20/2020   CL 100 11/20/2020   CALCIUM 9.8 11/20/2020   MG 2.0 04/20/2019   PHOS 3.2 04/20/2019    Bone Lab Results  Component Value Date   VD25OH 25 (L) 10/24/2016    Inflammation (CRP: Acute Phase) (ESR: Chronic Phase) Lab Results  Component Value Date   CRP 1.0 (H) 04/05/2015   ESRSEDRATE 28 (H) 11/20/2020   LATICACIDVEN 1.8 04/20/2019         Note: Above Lab results reviewed.  Imaging  DG PAIN CLINIC C-ARM 1-60 MIN NO REPORT Fluoro was used, but no Radiologist interpretation will be provided.  Please refer to "NOTES" tab for provider progress note.  Assessment  The primary encounter diagnosis was Lumbar facet syndrome (Bilateral) (R>L). Diagnoses of Spondylosis without myelopathy  or radiculopathy, lumbosacral region, Lumbar facet arthropathy, Chronic low back pain (Bilateral) (R>L) w/o sciatica, and Chronic pain syndrome were also pertinent to this visit.  Plan of Care  Problem-specific:  No problem-specific Assessment & Plan notes found for this encounter.  Mary Cruz has a current medication list which includes the following long-term medication(s): amitriptyline, clonazepam, duloxetine, hyoscyamine, levocetirizine, lisinopril-hydrochlorothiazide, methocarbamol, omeprazole, oxycodone, [START ON 01/04/2021] oxycodone, [START ON 02/03/2021] oxycodone, rizatriptan, topiramate, and famotidine.  Pharmacotherapy (Medications Ordered): No orders of the defined types were placed in this encounter.  Orders:  No orders of the defined types were placed in this encounter.  Follow-up plan:   Return in about 2 months (around 02/28/2021) for scheduled  encounter on 02/28/2021 for F2F MM.     Interventional treatment options:  Under consideration:   Diagnostic right suprascapular nerve block #1  Repeat therapeutic bilateral lumbar facet RFA    Therapeutic/palliative (PRN):   Palliative bilateral lumbar facet blocks  Palliative right lumbar facet RFA #3 (05/13/2019)  Palliative left lumbar facet RFA #3 (04/08/2019)  Palliative left C7-T1 CESI  PRN SCS programming adjustments (2018 replacement by Dr. Clydell Hakim)        Recent Visits Date Type Provider Dept  11/16/20 Procedure visit Milinda Pointer, MD [facility]  10/30/20 Office Visit Milinda Pointer, MD [facility]  Showing recent visits within past 90 days and meeting all other requirements Today's Visits Date Type Provider Dept  12/28/20 Office Visit Milinda Pointer, MD [facility]  Showing today's visits and meeting all other requirements Future Appointments Date Type Provider Dept  02/28/21 Appointment Milinda Pointer, MD [facility]  Showing future appointments within next 90 days and meeting all other requirements I discussed the assessment and treatment plan with the patient. The patient was provided an opportunity to ask questions and all were answered. The patient agreed with the plan and demonstrated an understanding of the instructions.  Patient advised to call back or seek an in-person evaluation if the symptoms or condition worsens.  Duration of encounter: 14 minutes.  Note by: Gaspar Cola, MD Date: 12/28/2020; Time: 8:40 AM

## 2020-12-28 ENCOUNTER — Other Ambulatory Visit: Payer: Self-pay

## 2020-12-28 ENCOUNTER — Ambulatory Visit: Payer: 59 | Attending: Pain Medicine | Admitting: Pain Medicine

## 2020-12-28 DIAGNOSIS — G894 Chronic pain syndrome: Secondary | ICD-10-CM

## 2020-12-28 DIAGNOSIS — G8929 Other chronic pain: Secondary | ICD-10-CM

## 2020-12-28 DIAGNOSIS — M47816 Spondylosis without myelopathy or radiculopathy, lumbar region: Secondary | ICD-10-CM

## 2020-12-28 DIAGNOSIS — M47817 Spondylosis without myelopathy or radiculopathy, lumbosacral region: Secondary | ICD-10-CM

## 2020-12-28 DIAGNOSIS — M545 Low back pain, unspecified: Secondary | ICD-10-CM

## 2021-01-01 ENCOUNTER — Encounter: Payer: Self-pay | Admitting: Medical-Surgical

## 2021-01-01 MED ORDER — TOPIRAMATE 50 MG PO TABS
50.0000 mg | ORAL_TABLET | Freq: Every day | ORAL | 1 refills | Status: DC
Start: 2021-01-01 — End: 2021-02-02

## 2021-01-13 ENCOUNTER — Emergency Department (HOSPITAL_COMMUNITY): Payer: 59

## 2021-01-13 ENCOUNTER — Emergency Department: Payer: 59

## 2021-01-13 ENCOUNTER — Encounter: Payer: Self-pay | Admitting: Emergency Medicine

## 2021-01-13 ENCOUNTER — Emergency Department
Admission: EM | Admit: 2021-01-13 | Discharge: 2021-01-13 | Disposition: A | Discharge status: Home / Self Care | Payer: 59 | Attending: Emergency Medicine | Admitting: Emergency Medicine

## 2021-01-13 ENCOUNTER — Other Ambulatory Visit: Payer: Self-pay

## 2021-01-13 DIAGNOSIS — Z9682 Presence of neurostimulator: Secondary | ICD-10-CM | POA: Diagnosis not present

## 2021-01-13 DIAGNOSIS — E669 Obesity, unspecified: Secondary | ICD-10-CM | POA: Insufficient documentation

## 2021-01-13 DIAGNOSIS — G894 Chronic pain syndrome: Secondary | ICD-10-CM | POA: Insufficient documentation

## 2021-01-13 DIAGNOSIS — Z9089 Acquired absence of other organs: Secondary | ICD-10-CM | POA: Insufficient documentation

## 2021-01-13 DIAGNOSIS — Z87891 Personal history of nicotine dependence: Secondary | ICD-10-CM | POA: Insufficient documentation

## 2021-01-13 DIAGNOSIS — R109 Unspecified abdominal pain: Secondary | ICD-10-CM | POA: Diagnosis present

## 2021-01-13 DIAGNOSIS — K5090 Crohn's disease, unspecified, without complications: Secondary | ICD-10-CM | POA: Insufficient documentation

## 2021-01-13 DIAGNOSIS — N179 Acute kidney failure, unspecified: Secondary | ICD-10-CM | POA: Insufficient documentation

## 2021-01-13 DIAGNOSIS — K529 Noninfective gastroenteritis and colitis, unspecified: Secondary | ICD-10-CM | POA: Insufficient documentation

## 2021-01-13 DIAGNOSIS — Z6835 Body mass index (BMI) 35.0-35.9, adult: Secondary | ICD-10-CM | POA: Diagnosis not present

## 2021-01-13 DIAGNOSIS — R7303 Prediabetes: Secondary | ICD-10-CM | POA: Insufficient documentation

## 2021-01-13 DIAGNOSIS — I10 Essential (primary) hypertension: Secondary | ICD-10-CM | POA: Insufficient documentation

## 2021-01-13 DIAGNOSIS — Z79899 Other long term (current) drug therapy: Secondary | ICD-10-CM | POA: Insufficient documentation

## 2021-01-13 LAB — URINALYSIS, ROUTINE W REFLEX MICROSCOPIC
Bilirubin Urine: NEGATIVE
Glucose, UA: NEGATIVE mg/dL
Hgb urine dipstick: NEGATIVE
Ketones, ur: NEGATIVE mg/dL
Leukocytes,Ua: NEGATIVE
Nitrite: NEGATIVE
Protein, ur: 30 mg/dL — AB
Specific Gravity, Urine: 1.019 (ref 1.005–1.030)
pH: 5 (ref 5.0–8.0)

## 2021-01-13 LAB — GASTROINTESTINAL PANEL BY PCR, STOOL (REPLACES STOOL CULTURE)

## 2021-01-13 LAB — COMPREHENSIVE METABOLIC PANEL
ALT: 20 U/L (ref 0–44)
AST: 18 U/L (ref 15–41)
Albumin: 3.8 g/dL (ref 3.5–5.0)
Alkaline Phosphatase: 96 U/L (ref 38–126)
Anion gap: 8 (ref 5–15)
BUN: 22 mg/dL — ABNORMAL HIGH (ref 6–20)
CO2: 26 mmol/L (ref 22–32)
Calcium: 9.3 mg/dL (ref 8.9–10.3)
Chloride: 103 mmol/L (ref 98–111)
Creatinine, Ser: 1.85 mg/dL — ABNORMAL HIGH (ref 0.44–1.00)
GFR, Estimated: 34 mL/min — ABNORMAL LOW (ref >60)
Glucose, Bld: 125 mg/dL — ABNORMAL HIGH (ref 70–99)
Potassium: 4 mmol/L (ref 3.5–5.1)
Sodium: 137 mmol/L (ref 135–145)
Total Bilirubin: 0.6 mg/dL (ref 0.3–1.2)
Total Protein: 7.5 g/dL (ref 6.5–8.1)

## 2021-01-13 LAB — C DIFFICILE QUICK SCREEN W PCR REFLEX
C Diff antigen: NEGATIVE
C Diff interpretation: NOT DETECTED
C Diff toxin: NEGATIVE

## 2021-01-13 LAB — CBC
HCT: 42.7 % (ref 36.0–46.0)
Hemoglobin: 14.5 g/dL (ref 12.0–15.0)
MCH: 31.5 pg (ref 26.0–34.0)
MCHC: 34 g/dL (ref 30.0–36.0)
MCV: 92.6 fL (ref 80.0–100.0)
Platelets: 297 10*3/uL (ref 150–400)
RBC: 4.61 MIL/uL (ref 3.87–5.11)
RDW: 13.2 % (ref 11.5–15.5)
WBC: 19.5 10*3/uL — ABNORMAL HIGH (ref 4.0–10.5)
nRBC: 0 % (ref 0.0–0.2)

## 2021-01-13 LAB — LIPASE, BLOOD: Lipase: 20 U/L (ref 11–51)

## 2021-01-13 LAB — HCG, QUANTITATIVE, PREGNANCY: hCG, Beta Chain, Quant, S: 1 m[IU]/mL (ref <5)

## 2021-01-13 MED ORDER — OXYCODONE-ACETAMINOPHEN 5-325 MG PO TABS: 1.0000 | ORAL_TABLET | Freq: Three times a day (TID) | ORAL | 0 refills | Status: AC | PRN

## 2021-01-13 MED ORDER — LACTATED RINGERS IV BOLUS
1000.0000 mL | Freq: Once | INTRAVENOUS | Status: AC
  Administered 2021-01-13: 1000 mL via INTRAVENOUS

## 2021-01-13 MED ORDER — ACETAMINOPHEN 500 MG PO TABS
1000.0000 mg | ORAL_TABLET | Freq: Once | ORAL | Status: AC
  Administered 2021-01-13: 1000 mg via ORAL
  Filled 2021-01-13: qty 2

## 2021-01-13 MED ORDER — CIPROFLOXACIN HCL 500 MG PO TABS: 500.0000 mg | ORAL_TABLET | Freq: Two times a day (BID) | ORAL | 0 refills | Status: AC

## 2021-01-13 MED ORDER — ONDANSETRON HCL 4 MG PO TABS: 4.0000 mg | ORAL_TABLET | Freq: Three times a day (TID) | ORAL | 0 refills | Status: DC | PRN

## 2021-01-13 MED ORDER — DIPHENHYDRAMINE HCL 50 MG/ML IJ SOLN
25.0000 mg | Freq: Once | INTRAMUSCULAR | Status: AC
  Administered 2021-01-13: 25 mg via INTRAVENOUS
  Filled 2021-01-13: qty 1

## 2021-01-13 MED ORDER — MORPHINE SULFATE (PF) 4 MG/ML IV SOLN
4.0000 mg | Freq: Once | INTRAVENOUS | Status: AC
  Administered 2021-01-13: 4 mg via INTRAVENOUS
  Filled 2021-01-13: qty 1

## 2021-01-13 MED ORDER — ONDANSETRON HCL 4 MG/2ML IJ SOLN
4.0000 mg | Freq: Once | INTRAMUSCULAR | Status: AC
  Administered 2021-01-13: 4 mg via INTRAVENOUS
  Filled 2021-01-13: qty 2

## 2021-01-13 MED ORDER — IOHEXOL 300 MG/ML  SOLN
75.0000 mL | Freq: Once | INTRAMUSCULAR | Status: AC | PRN
  Administered 2021-01-13: 75 mL via INTRAVENOUS

## 2021-01-13 MED ORDER — MORPHINE SULFATE (PF) 4 MG/ML IV SOLN
6.0000 mg | Freq: Once | INTRAVENOUS | Status: AC
  Administered 2021-01-13: 6 mg via INTRAVENOUS
  Filled 2021-01-13: qty 2

## 2021-01-13 NOTE — Discharge Instructions (Addendum)
Please follow-up with gastroenterology and your PCP.  It is very important that her kidney function rechecked next week to ensure it is improving.  Your CT today showed: 1. Thickening of the walls of the descending colon, with mild pericolonic inflammation, compatible with a colitis of infectious or inflammatory nature. 2. New masslike consolidation within the RIGHT lower lobe, measuring 2.3 cm, most likely chronic rounded atelectasis and/or sequela of a previous lung contusion given the overlying healed rib fractures. Would consider follow-up chest CT in 3-6 months to ensure stability and prove benignity. 3. No other acute or significant findings within the abdomen or pelvis. No bowel obstruction. No evidence of acute solid organ abnormality. No renal or ureteral calculi.

## 2021-01-13 NOTE — ED Triage Notes (Signed)
Pt presents to ER from home via EMS with complaints of abdominal pain. Pt reports pain started after midnight. Has a history of colitis. Pt reports she had a bowel movement with bright red blood in it. Pt reports last episode of colitis was last yr. Pt in severe distress. Pt reports pain located upped mid and lower mid area of abdomen

## 2021-01-13 NOTE — ED Provider Notes (Signed)
[facility] Emergency Department Provider Note  ____________________________________________   Event Date/Time   First MD Initiated Contact with Patient 01/13/21 [phone]     (approximate)  I have reviewed the triage vital signs and the nursing notes.   HISTORY  Chief Complaint Abdominal Pain   HPI Mary Cruz is a 45 y.o. female with past dental history of appendectomy and cholecystectomy as well as chronic low back pain with spinal stimulator in place and on chronic opioids, HTN, HDL, fibromyalgia previous episode of colitis with colonoscopy showing for patient Crohn's versus ulcerative colitis who presents for assessment of generalized abdominal pain associate with nausea and bloody diarrhea.  Patient states he has been feeling abdominal pain on and off over the last 2 weeks but it got much worse yesterday.  She has not had any fevers, chills, earache, sore throat, chest pain, cough, shortness of breath, pelvic pain, lower back pain, burning sedation, rash or extremity pain.  Patient states this feels little bit similar but this is years which.  Denies significant NSAID use or EtOH use.  No other acute concerns.  No recent travel outside of [place] for suspicious food intake.         Past Medical History:  Diagnosis Date   Abscess of axillary fold 10/10/2013   Acute bronchitis with asthma with acute exacerbation 01/23/2015   Dehydration 04/19/2019   Elevated lactic acid level 04/19/2019   Encounter for management of implanted device 12/19/2014   Encounter for therapeutic drug level monitoring 12/19/2014   Fibromyalgia    Hyperglycemia 04/19/2019   Hypertension    Implant pain at battery site (right buttocks area) 04/05/2015   KIDNEY STONES 07/02/2007   Qualifier: History of  By: Bobby Rumpf CMA (AAMA), [place]     Long term current use of opiate analgesic 12/19/2014   Lupus ([place])    Morbid obesity ([place]) 12/19/2014   Muscle cramping 12/19/2014   Opiate use (30  MME/Day) 12/19/2014   Osteoarthritis    Pain    chronic regional pain syndrome   Right knee pain 01/10/2015   Sepsis ([place]) 04/19/2019   Sepsis affecting skin 10/10/2013   Tobacco abuse 04/19/2019    Patient Active Problem List   Diagnosis Date Noted   Chronic feet pain (Bilateral) 10/30/2020   Prediabetes 24/10/7351   Metabolic syndrome 29/92/4268   Mixed hyperlipidemia 08/04/2020   Chronic use of opiate for therapeutic purpose 06/27/2020   Closed fracture of multiple ribs of right side 05/08/2020   Contusion of both lungs 05/08/2020   Pneumothorax, right 05/08/2020   Fall from horse 05/06/2020   Interstitial cystitis 05/04/2020   Vaginal candidiasis 05/04/2020   Intractable low back pain 04/25/2020   Appendicitis 01/01/2020   Chronic low back pain (Right) w/ sciatica (Right) 06/21/2019   Chronic lower extremity pain (Right) 06/21/2019   Lumbosacral radiculopathy at L5 (Right) 06/21/2019   Dropfoot (Right) 06/21/2019   Lumbar facet arthropathy 06/21/2019   Colitis 04/19/2019   Osteoarthritis involving multiple joints 04/08/2019   Latex precautions, history of latex allergy 04/08/2019   Spondylosis without myelopathy or radiculopathy, lumbosacral region 02/25/2019   DDD (degenerative disc disease), lumbar 02/25/2019   DDD (degenerative disc disease), cervical 11/15/2018   Cervicalgia 11/15/2018   Abnormal MRI, shoulder (Right) 11/15/2018   Chronic upper extremity pain (2ry area of Pain) (Bilateral) (L>R) 08/20/2018   Chronic hip pain (3ry area of Pain) (Bilateral) (R>L) 08/20/2018   Closed fracture of scapula, sequela (Right) 08/20/2018   Pharmacologic  therapy 08/20/2018   Disorder of skeletal system 08/20/2018   Problems influencing health status 08/20/2018   Pain in right knee 03/19/2017   Family history of thyroid disease 10/24/2016   Neurogenic pain 10/10/2016   Fibromyalgia 10/10/2016   Spinal cord stimulator status 05/16/2016   Battery end of life of spinal cord  stimulator 05/16/2016   Positive ANA (antinuclear antibody) 09/13/2015   Lumbar spondylosis 06/29/2015   Lumbar facet syndrome (Bilateral) (R>L) 06/29/2015   Anxiety and depression 01/20/2015   Long term prescription opiate use 93/71/6967   Uncomplicated opioid dependence ([place]) 12/19/2014   Chronic musculoskeletal pain 12/19/2014   Osteoarthrosis 12/19/2014   Vitamin D insufficiency 12/19/2014   Presence of functional implant ([employer]onic Lumbar spinal cord stimulator implant) 12/19/2014   Chronic low back pain (Bilateral) (R>L) w/o sciatica 12/19/2014   Chronic lumbar radicular pain (Right L5 dermatome; Left S1 Dermatome) (Bilateral) (R>L) 12/19/2014   Chronic neck pain (1ry area of Pain) (Bilateral) (L>R) 12/19/2014   Morbid obesity ([place]) 12/19/2014   Chronic cervical radicular pain (C7 Dermatome) (Bilateral) (L>R) 12/19/2014   Chronic lower extremity pain (Bilateral) (R>L) 12/19/2014   Severe obesity with body mass index (BMI) of 35.0 to 39.9 with comorbidity ([place]) 10/27/2014   Generalized anxiety disorder 03/16/2014   Essential hypertension, benign 07/09/2013   Chronic pain syndrome 07/09/2013   GERD 07/02/2007   Irritable bowel syndrome 07/02/2007    Past Surgical History:  Procedure Laterality Date   BREAST SURGERY     CHOLECYSTECTOMY  2000   ENDOMETRIAL ABLATION     KNEE SURGERY     KNEE SURGERY Right 02-2015   LAPAROSCOPIC APPENDECTOMY Right 01/01/2020   Procedure: APPENDECTOMY LAPAROSCOPIC;  Surgeon: Rolm Bookbinder, MD;  Location: [facility];  Service: General;  Laterality: Right;   OCCIPITAL NERVE STIMULATOR INSERTION     SHOULDER ARTHROSCOPY WITH DISTAL CLAVICLE RESECTION Right 10/29/2018   Procedure: SHOULDER ARTHROSCOPY WITH DISTAL CLAVICLE RESECTION;  Surgeon: Hiram Gash, MD;  Location: [facility];  Service: Orthopedics;  Laterality: Right;   SHOULDER SURGERY     spinal cord stimulator     SPINAL CORD STIMULATOR IMPLANT  2008   SPINAL CORD  STIMULATOR INSERTION N/A 06/21/2016   Procedure: LUMBAR SPINAL CORD STIMULATOR INSERTION;  Surgeon: Clydell Hakim, MD;  Location: [facility];  Service: Neurosurgery;  Laterality: N/A;  LUMBAR SPINAL CORD STIMULATOR INSERTION   SUBACROMIAL DECOMPRESSION Right 10/29/2018   Procedure: SUBACROMIAL DECOMPRESSION;  Surgeon: Hiram Gash, MD;  Location: [facility];  Service: Orthopedics;  Laterality: Right;   TUBAL LIGATION  2000    Prior to Admission medications   Medication Sig Start Date End Date Taking? Authorizing Provider  ciprofloxacin (CIPRO) 500 MG tablet Take 1 tablet (500 mg total) by mouth 2 (two) times daily for 5 days. 01/13/21 01/18/21 Yes Lucrezia Starch, MD  ondansetron (ZOFRAN) 4 MG tablet Take 1 tablet (4 mg total) by mouth every 8 (eight) hours as needed for up to 10 doses for nausea or vomiting. 01/13/21  Yes Lucrezia Starch, MD  oxyCODONE-acetaminophen (PERCOCET) 5-325 MG tablet Take 1 tablet by mouth every 8 (eight) hours as needed for up to 5 days for severe pain. 01/13/21 01/18/21 Yes Lucrezia Starch, MD  amitriptyline (ELAVIL) 100 MG tablet TAKE 1 TABLET BY MOUTH EVERYDAY AT BEDTIME 10/24/20   Samuel Bouche, NP  clobetasol ointment (TEMOVATE) 8.93 % Apply 1 application topically 2 (two) times daily. 09/21/20   Samuel Bouche, NP  clonazePAM (KLONOPIN) 0.5  MG tablet Take 0.5-1 tablets (0.25-0.5 mg total) by mouth 2 (two) times daily as needed for anxiety. 04/15/19   Orma Render, NP  DULoxetine (CYMBALTA) 60 MG capsule Take 1 capsule (60 mg total) by mouth 2 (two) times daily. 11/20/20   Samuel Bouche, NP  famotidine (PEPCID) 20 MG tablet Take 1 tablet (20 mg total) by mouth 2 (two) times daily for 14 days. 09/21/20 11/16/20  Samuel Bouche, NP  hyoscyamine (LEVSIN SL) 0.125 MG SL tablet PLACE 1 TABLET (0.125 MG TOTAL) UNDER THE TONGUE DAILY AS NEEDED. 07/27/20   Samuel Bouche, NP  levocetirizine (XYZAL) 5 MG tablet Take 1 tablet (5 mg total) by mouth every evening. 11/20/20   Samuel Bouche,  NP  lidocaine (XYLOCAINE) 5 % ointment Apply 1 application topically daily. 2 hours before skin procedure. 09/21/20   Samuel Bouche, NP  lisinopril-hydrochlorothiazide (ZESTORETIC) 10-12.5 MG tablet Take 1 tablet by mouth daily. 06/29/20   Samuel Bouche, NP  methocarbamol (ROBAXIN) 750 MG tablet Take 1 tablet (750 mg total) by mouth every 8 (eight) hours as needed for muscle spasms. 11/20/20   Samuel Bouche, NP  omeprazole (PRILOSEC) 40 MG capsule Take 1 capsule (40 mg total) by mouth daily. 06/29/20   Samuel Bouche, NP  oxyCODONE (OXY IR/ROXICODONE) 5 MG immediate release tablet Take 1 tablet (5 mg total) by mouth every 6 (six) hours as needed for severe pain. Must last 30 days 12/05/20 01/04/21  Milinda Pointer, MD  oxyCODONE (OXY IR/ROXICODONE) 5 MG immediate release tablet Take 1 tablet (5 mg total) by mouth every 6 (six) hours as needed for severe pain. Must last 30 days 01/04/21 02/03/21  Milinda Pointer, MD  oxyCODONE (OXY IR/ROXICODONE) 5 MG immediate release tablet Take 1 tablet (5 mg total) by mouth every 6 (six) hours as needed for severe pain. Must last 30 days 02/03/21 03/05/21  Milinda Pointer, MD  Probiotic Product (ALIGN PO) Take by mouth daily.    [provider]  rizatriptan (MAXALT) 10 MG tablet Take 1 tablet (10 mg total) by mouth as needed for migraine. May repeat in 2 hours if needed 06/29/20   Samuel Bouche, NP  topiramate (TOPAMAX) 50 MG tablet Take 1 tablet (50 mg total) by mouth daily. 01/01/21   Samuel Bouche, NP    Allergies Celecoxib, Doxycycline, Gabapentin, Latex, Nabumetone, Pentazocine lactate, Seroquel [quetiapine], Amoxicillin, Bactrim [sulfamethoxazole-trimethoprim], Cephalexin, Erythromycin, Hydromorphone hcl, and Lyrica [pregabalin]  Family History  Problem Relation Age of Onset   Depression Mother        maternal grandmother   Asthma Mother    Fibromyalgia Other        aunt   Stroke Other        grandmother   Thyroid disease Other        grandmother     Social History Social History   Tobacco Use   Smoking status: Former    Types: Cigarettes   Smokeless tobacco: Former    Quit date: 12/22/2017  Vaping Use   Vaping Use: Former  Substance Use Topics   Alcohol use: No    Alcohol/week: 0.0 standard drinks   Drug use: No    Review of Systems  Review of Systems  Constitutional:  Negative for chills and fever.  HENT:  Negative for sore throat.   Eyes:  Negative for pain.  Respiratory:  Negative for cough and stridor.   Cardiovascular:  Negative for chest pain.  Gastrointestinal:  Positive for abdominal pain, blood in stool, diarrhea and nausea.  Negative for vomiting.  Genitourinary:  Negative for dysuria.  Musculoskeletal:  Negative for myalgias.  Skin:  Negative for rash.  Neurological:  Negative for seizures, loss of consciousness and headaches.  Psychiatric/Behavioral:  Negative for suicidal ideas.   All other systems reviewed and are negative.    ____________________________________________   PHYSICAL EXAM:  VITAL SIGNS: ED Triage Vitals  Enc Vitals Group     BP 01/13/21 0601 (!) 140/116     Pulse Rate 01/13/21 0601 90     Resp 01/13/21 0601 16     Temp 01/13/21 0601 (!) 97.4 F (36.3 C)     Temp Source 01/13/21 0601 Oral     SpO2 01/13/21 0601 99 %     Weight 01/13/21 0602 235 lb (106.6 kg)     Height 01/13/21 0602 5\' 4"  (1.626 m)     Head Circumference --      Peak Flow --      Pain Score 01/13/21 0601 10     Pain Loc --      Pain Edu? --      Excl. in [place]? --    Vitals:   01/13/21 1035 01/13/21 1246  BP: 100/64 101/61  Pulse: 91 85  Resp: 14 18  Temp:    SpO2: 96% 100%   Physical Exam Vitals and nursing note reviewed.  Constitutional:      General: She is not in acute distress.    Appearance: She is well-developed. She is ill-appearing.  HENT:     Head: Normocephalic and atraumatic.     Right Ear: External ear normal.     Left Ear: External ear normal.     Nose: Nose normal.      Mouth/Throat:     Mouth: Mucous membranes are dry.  Eyes:     Conjunctiva/sclera: Conjunctivae normal.  Cardiovascular:     Rate and Rhythm: Normal rate and regular rhythm.     Heart sounds: No murmur heard. Pulmonary:     Effort: Pulmonary effort is normal. No respiratory distress.     Breath sounds: Normal breath sounds.  Abdominal:     Palpations: Abdomen is soft.     Tenderness: There is generalized abdominal tenderness and tenderness in the epigastric area. There is no right CVA tenderness or left CVA tenderness.  Musculoskeletal:        General: No swelling.     Cervical back: Neck supple.  Skin:    General: Skin is warm and dry.     Capillary Refill: Capillary refill takes more than 3 seconds.  Neurological:     Mental Status: She is alert.  Psychiatric:        Mood and Affect: Mood normal.     ____________________________________________   LABS (all labs ordered are listed, but only abnormal results are displayed)  Labs Reviewed  COMPREHENSIVE METABOLIC PANEL - Abnormal; Notable for the following components:      Result Value   Glucose, Bld 125 (*)    BUN 22 (*)    Creatinine, Ser 1.85 (*)    GFR, Estimated 34 (*)    All other components within normal limits  CBC - Abnormal; Notable for the following components:   WBC 19.5 (*)    All other components within normal limits  URINALYSIS, ROUTINE W REFLEX MICROSCOPIC - Abnormal; Notable for the following components:   Color, Urine AMBER (*)    APPearance CLOUDY (*)    Protein, ur 30 (*)    Bacteria, UA  RARE (*)    All other components within normal limits  C DIFFICILE QUICK SCREEN W PCR REFLEX    GASTROINTESTINAL PANEL BY PCR, STOOL (REPLACES STOOL CULTURE)  CALPROTECTIN, FECAL  LIPASE, BLOOD  HCG, QUANTITATIVE, PREGNANCY  POC URINE PREG, ED  CBG MONITORING, ED   ____________________________________________  EKG  ECG remarkable sinus rhythm with poor, normal axis, unremarkable intervals without evidence  of acute ischemia or significant arrhythmia. ____________________________________________  RADIOLOGY  ED MD interpretation: CT abdomen pelvis shows some thickening of the descending colon and mild pericolonic inflammation consistent with colitis.  There is a consolidation in the right lower lobe consistent with sequelae of his lung contusion as well as evidence of previously healed rib fractures.  No other acute abdominal pelvic process including evidence of diverticulitis, abscess, perforation, kidney stone, perinephric stranding or other acute process.  Official radiology report(s): CT ABDOMEN PELVIS W CONTRAST  Result Date: 01/13/2021 CLINICAL DATA:  Abdominal pain.  Nausea. EXAM: CT ABDOMEN AND PELVIS WITH CONTRAST TECHNIQUE: Multidetector CT imaging of the abdomen and pelvis was performed using the standard protocol following bolus administration of intravenous contrast. CONTRAST:  48mL OMNIPAQUE IOHEXOL 300 MG/ML  SOLN COMPARISON:  CT abdomen dated 12/31/2019. FINDINGS: Lower chest: New masslike consolidation within the RIGHT lower lobe, measuring 2.3 cm, most likely chronic rounded atelectasis and/or sequela of a previous lung contusion given the overlying healed rib fractures. LEFT lung bases clear. Hepatobiliary: No focal liver abnormality is seen. Status post cholecystectomy. No biliary dilatation. Pancreas: Partially infiltrated with fat but otherwise unremarkable. Spleen: Normal in size without focal abnormality. Adrenals/Urinary Tract: Adrenal glands are unremarkable. Kidneys appear normal without mass, stone or hydronephrosis. No ureteral or bladder calculi are identified. Bladder is decompressed. Stomach/Bowel: No dilated large or small bowel loops. Thickening of the walls of the descending colon, with mild pericolonic inflammation. Surgical changes of appendectomy. Stomach is unremarkable. Vascular/Lymphatic: No significant vascular findings are present. No enlarged abdominal or pelvic  lymph nodes. Reproductive: Uterus and bilateral adnexa are unremarkable. Other: No abscess collection.  No free intraperitoneal air. Musculoskeletal: No acute or suspicious osseous abnormality. Old healed rib fractures at the RIGHT lung base. IMPRESSION: 1. Thickening of the walls of the descending colon, with mild pericolonic inflammation, compatible with a colitis of infectious or inflammatory nature. 2. New masslike consolidation within the RIGHT lower lobe, measuring 2.3 cm, most likely chronic rounded atelectasis and/or sequela of a previous lung contusion given the overlying healed rib fractures. Would consider follow-up chest CT in 3-6 months to ensure stability and prove benignity. 3. No other acute or significant findings within the abdomen or pelvis. No bowel obstruction. No evidence of acute solid organ abnormality. No renal or ureteral calculi. Electronically Signed   By: Franki Cabot M.D.   On: 01/13/2021 11:14    ____________________________________________   PROCEDURES  Procedure(s) performed (including Critical Care):  .1-3 Lead EKG Interpretation Performed by: Lucrezia Starch, MD Authorized by: Lucrezia Starch, MD     Interpretation: non-specific     ECG rate assessment: normal     Rhythm: sinus rhythm     Ectopy: none     Conduction: normal     ____________________________________________   INITIAL IMPRESSION / ASSESSMENT AND PLAN / ED COURSE      Patient presents with above-stated history exam for assessment of some acute on subacute pain associate with bloody diarrhea and nausea.  On arrival patient is afebrile and hemodynamically stable.  She is quite tender throughout her abdomen and  appears dehydrated.  Primary differential is autoimmune versus infectious colitis.  Patient is already status postcholecystectomy and appendectomy.  Lower suspicion for ischemic colitis at this time.  ECG has no evidence of arrhythmia or A. Fib that would predispose to  this.  Lipase 20 not consistent with acute pancreatitis.  CMP shows no significant electrolyte derangement and AKI with creatinine of 1.85 compared to 0.58-month ago.  No evidence of hepatitis or cholestasis.  CBC shows leukocytosis with WBC count of 19.5 but no evidence of acute anemia and normal platelets.  hCG is negative.  UA is not suggestive of cystitis.  CT abdomen pelvis shows some thickening of the descending colon and mild pericolonic inflammation consistent with colitis.  There is a consolidation in the right lower lobe consistent with sequelae of his lung contusion as well as evidence of previously healed rib fractures.  No other acute abdominal pelvic process including evidence of diverticulitis, abscess, perforation, kidney stone, perinephric stranding or other acute process.  Discussed incidental finding of the lung consolidation that she can follow-up with PCP not emergently as well as concerns for likely autoimmune colitis and evaluation because of AKI.  Patient was hydrated with IV fluids and subsequently was able to tolerate p.o. after pain control was achieved.  Will cover with short course of antibiotics for possible infectious colitis likely think this is less likely.  I discussed with on-call gastroenterologist Dr. Haig Prophet we will follow-up with patient in clinic as she is not currently following with a gastroenterologist.  In addition we will prescribe short course of Zofran and short course of Percocet in addition to her chronic opioids after extensive discussion of the risks of this.  She is understanding that there is risk for any sedation that she has been extremely careful and not taking any more than absolutely prescribed necessary.  Discussed also following up with PCP next week to have kidney function rechecked and return for any worsening of symptoms pain or inability tolerate p.o.  Fecal calprotectin and C. difficile genetic panel sent from the ED prior to discharge.  She  develop these results with GI.      ____________________________________________   FINAL CLINICAL IMPRESSION(S) / ED DIAGNOSES  Final diagnoses:  AKI (acute kidney injury) ([place])  Colitis    Medications  morphine 4 MG/ML injection 4 mg (4 mg Intravenous Given 01/13/21 0650)  diphenhydrAMINE (BENADRYL) injection 25 mg (25 mg Intravenous Given 01/13/21 0648)  lactated ringers bolus 1,000 mL (0 mLs Intravenous Stopped 01/13/21 1246)  ondansetron (ZOFRAN) injection 4 mg (4 mg Intravenous Given 01/13/21 1020)  morphine 4 MG/ML injection 6 mg (6 mg Intravenous Given 01/13/21 1024)  iohexol (OMNIPAQUE) 300 MG/ML solution 75 mL (75 mLs Intravenous Contrast Given 01/13/21 1053)  acetaminophen (TYLENOL) tablet 1,000 mg (1,000 mg Oral Given 01/13/21 1244)     ED Discharge Orders          Ordered    ciprofloxacin (CIPRO) 500 MG tablet  2 times daily        01/13/21 1204    oxyCODONE-acetaminophen (PERCOCET) 5-325 MG tablet  Every 8 hours PRN        01/13/21 1204    ondansetron (ZOFRAN) 4 MG tablet  Every 8 hours PRN        01/13/21 1204             Note:  This document was prepared using Dragon voice recognition software and may include unintentional dictation errors.    Lucrezia Starch, MD 01/13/21  1459  

## 2021-01-13 NOTE — ED Notes (Signed)
Pt verbalized understanding of discharge and follow-up care instructions. Pt advised if symptoms worsen to return to ED. E-signature not available.

## 2021-01-13 NOTE — ED Notes (Signed)
Patient transported to CT 

## 2021-01-13 NOTE — ED Notes (Signed)
Pt tolerated drinking fluids.

## 2021-01-13 NOTE — ED Notes (Addendum)
   When assisting pt to the restroom, pt wiped and there was bright red blood on toilet paper.

## 2021-01-13 NOTE — ED Notes (Signed)
EMS administered 1L/[place] and 4 mg of zofran IV in route

## 2021-01-15 ENCOUNTER — Telehealth: Payer: Self-pay | Admitting: Pain Medicine

## 2021-01-15 ENCOUNTER — Ambulatory Visit: Payer: 59 | Admitting: Family Medicine

## 2021-01-15 ENCOUNTER — Other Ambulatory Visit: Payer: Self-pay

## 2021-01-15 ENCOUNTER — Encounter: Payer: Self-pay | Admitting: Family Medicine

## 2021-01-15 VITALS — BP 119/79 | HR 90 | Temp 98.7°F | Ht 64.0 in | Wt 245.0 lb

## 2021-01-15 DIAGNOSIS — K529 Noninfective gastroenteritis and colitis, unspecified: Secondary | ICD-10-CM

## 2021-01-15 DIAGNOSIS — R110 Nausea: Secondary | ICD-10-CM | POA: Diagnosis not present

## 2021-01-15 DIAGNOSIS — N179 Acute kidney failure, unspecified: Secondary | ICD-10-CM

## 2021-01-15 LAB — CALPROTECTIN, FECAL: Calprotectin, Fecal: 818 ug/g — ABNORMAL HIGH (ref 0–120)

## 2021-01-15 MED ORDER — ONDANSETRON HCL 4 MG PO TABS: 4.0000 mg | ORAL_TABLET | Freq: Three times a day (TID) | ORAL | 0 refills | Status: DC | PRN

## 2021-01-15 NOTE — Progress Notes (Signed)
Acute Office Visit  Subjective:    Patient ID: Mary Cruz, female    DOB: Sep 16, 1975, 45 y.o.   MRN: [id number]  Chief Complaint  Patient presents with   Follow-up    HPI Patient is in today for hospital follow-up.    01/13/2021 ED: Patient presented to the emergency department via EMS after several hours of nausea, abdominal pain, bloody diarrhea.  She was noted to be in acute renal failure and was given fluids.  EKG was sinus rhythm.  CT abdomen showed some thickening of descending colon and mild pericolonic inflammation consistent with colitis.  There is also a right lower lobe consolidation possibly chronic rounded atelectasis or related to previous lung contusion,, recommended repeat CT scan in 3 to 6 months.  Patient had a history of cholecystectomy and appendectomy years prior.  Patient was prescribed antibiotics for possible infectious colitis although thought to be less likely.  She was also referred to GI and prescribe Zofran and Percocet.  Fecal calprotectin and C. difficile genetic panel sent from ED -she will get those results with GI.  Today patient reports she still does not feel great, but is feeling better compared to the day she was in the ED.  She picked up prescriptions yesterday and began taking the antibiotic.  She has continued to take the Percocet for pain management and would like more Zofran for nausea.  Patient states she has an appointment scheduled with GI for next week.  She has not seen any more blood in her stools.  Reports her abdomen is sore but no severe pain like before.  She is very tired and has been trying to rest.  She still does get some occasional dizziness.  She is trying to stay well-hydrated.     Past Medical History:  Diagnosis Date   Abscess of axillary fold 10/10/2013   Acute bronchitis with asthma with acute exacerbation 01/23/2015   Dehydration 04/19/2019   Elevated lactic acid level 04/19/2019   Encounter for management of implanted  device 12/19/2014   Encounter for therapeutic drug level monitoring 12/19/2014   Fibromyalgia    Hyperglycemia 04/19/2019   Hypertension    Implant pain at battery site (right buttocks area) 04/05/2015   KIDNEY STONES 07/02/2007   Qualifier: History of  By: Bobby Rumpf CMA (AAMA), Patty     Long term current use of opiate analgesic 12/19/2014   Lupus ([place])    Morbid obesity ([place]) 12/19/2014   Muscle cramping 12/19/2014   Opiate use (30 MME/Day) 12/19/2014   Osteoarthritis    Pain    chronic regional pain syndrome   Right knee pain 01/10/2015   Sepsis ([place]) 04/19/2019   Sepsis affecting skin 10/10/2013   Tobacco abuse 04/19/2019    Past Surgical History:  Procedure Laterality Date   BREAST SURGERY     CHOLECYSTECTOMY  2000   ENDOMETRIAL ABLATION     KNEE SURGERY     KNEE SURGERY Right 02-2015   LAPAROSCOPIC APPENDECTOMY Right 01/01/2020   Procedure: APPENDECTOMY LAPAROSCOPIC;  Surgeon: Rolm Bookbinder, MD;  Location: [facility];  Service: General;  Laterality: Right;   OCCIPITAL NERVE STIMULATOR INSERTION     SHOULDER ARTHROSCOPY WITH DISTAL CLAVICLE RESECTION Right 10/29/2018   Procedure: SHOULDER ARTHROSCOPY WITH DISTAL CLAVICLE RESECTION;  Surgeon: Hiram Gash, MD;  Location: [place];  Service: Orthopedics;  Laterality: Right;   SHOULDER SURGERY     spinal cord stimulator     SPINAL CORD STIMULATOR IMPLANT  2008  SPINAL CORD STIMULATOR INSERTION N/A 06/21/2016   Procedure: LUMBAR SPINAL CORD STIMULATOR INSERTION;  Surgeon: Clydell Hakim, MD;  Location: [place];  Service: Neurosurgery;  Laterality: N/A;  LUMBAR SPINAL CORD STIMULATOR INSERTION   SUBACROMIAL DECOMPRESSION Right 10/29/2018   Procedure: SUBACROMIAL DECOMPRESSION;  Surgeon: Hiram Gash, MD;  Location: [place];  Service: Orthopedics;  Laterality: Right;   TUBAL LIGATION  2000    Family History  Problem Relation Age of Onset   Depression Mother        maternal grandmother   Asthma Mother     Fibromyalgia Other        aunt   Stroke Other        grandmother   Thyroid disease Other        grandmother    Social History   Socioeconomic History   Marital status: Married    Spouse name: Not on file   Number of children: Not on file   Years of education: Not on file   Highest education level: Not on file  Occupational History   Not on file  Tobacco Use   Smoking status: Former    Types: Cigarettes   Smokeless tobacco: Former    Quit date: 12/22/2017  Vaping Use   Vaping Use: Former  Substance and Sexual Activity   Alcohol use: No    Alcohol/week: 0.0 standard drinks   Drug use: No   Sexual activity: Yes    Partners: Male    Birth control/protection: Surgical  Other Topics Concern   Not on file  Social History Narrative   Not on file   Social Determinants of Health   Financial Resource Strain: Not on file  Food Insecurity: Not on file  Transportation Needs: Not on file  Physical Activity: Not on file  Stress: Not on file  Social Connections: Not on file  Intimate Partner Violence: Not on file    Outpatient Medications Prior to Visit  Medication Sig Dispense Refill   amitriptyline (ELAVIL) 100 MG tablet TAKE 1 TABLET BY MOUTH EVERYDAY AT BEDTIME 90 tablet 2   ciprofloxacin (CIPRO) 500 MG tablet Take 1 tablet (500 mg total) by mouth 2 (two) times daily for 5 days. 10 tablet 0   clobetasol ointment (TEMOVATE) 6.59 % Apply 1 application topically 2 (two) times daily. 30 g 0   clonazePAM (KLONOPIN) 0.5 MG tablet Take 0.5-1 tablets (0.25-0.5 mg total) by mouth 2 (two) times daily as needed for anxiety. 30 tablet 0   DULoxetine (CYMBALTA) 60 MG capsule Take 1 capsule (60 mg total) by mouth 2 (two) times daily. 180 capsule 3   hyoscyamine (LEVSIN SL) 0.125 MG SL tablet PLACE 1 TABLET (0.125 MG TOTAL) UNDER THE TONGUE DAILY AS NEEDED. 90 tablet 1   levocetirizine (XYZAL) 5 MG tablet Take 1 tablet (5 mg total) by mouth every evening. 90 tablet 1   lidocaine  (XYLOCAINE) 5 % ointment Apply 1 application topically daily. 2 hours before skin procedure. 50 g 11   lisinopril-hydrochlorothiazide (ZESTORETIC) 10-12.5 MG tablet Take 1 tablet by mouth daily. 90 tablet 3   methocarbamol (ROBAXIN) 750 MG tablet Take 1 tablet (750 mg total) by mouth every 8 (eight) hours as needed for muscle spasms. 270 tablet 1   omeprazole (PRILOSEC) 40 MG capsule Take 1 capsule (40 mg total) by mouth daily. 90 capsule 3   oxyCODONE (OXY IR/ROXICODONE) 5 MG immediate release tablet Take 1 tablet (5 mg total) by mouth every 6 (six) hours  as needed for severe pain. Must last 30 days 120 tablet 0   [START ON 02/03/2021] oxyCODONE (OXY IR/ROXICODONE) 5 MG immediate release tablet Take 1 tablet (5 mg total) by mouth every 6 (six) hours as needed for severe pain. Must last 30 days 120 tablet 0   oxyCODONE-acetaminophen (PERCOCET) 5-325 MG tablet Take 1 tablet by mouth every 8 (eight) hours as needed for up to 5 days for severe pain. 10 tablet 0   Probiotic Product (ALIGN PO) Take by mouth daily.     rizatriptan (MAXALT) 10 MG tablet Take 1 tablet (10 mg total) by mouth as needed for migraine. May repeat in 2 hours if needed 10 tablet 12   topiramate (TOPAMAX) 50 MG tablet Take 1 tablet (50 mg total) by mouth daily. 30 tablet 1   ondansetron (ZOFRAN) 4 MG tablet Take 1 tablet (4 mg total) by mouth every 8 (eight) hours as needed for up to 10 doses for nausea or vomiting. 10 tablet 0   famotidine (PEPCID) 20 MG tablet Take 1 tablet (20 mg total) by mouth 2 (two) times daily for 14 days. 28 tablet 0   oxyCODONE (OXY IR/ROXICODONE) 5 MG immediate release tablet Take 1 tablet (5 mg total) by mouth every 6 (six) hours as needed for severe pain. Must last 30 days 120 tablet 0   No facility-administered medications prior to visit.    Allergies  Allergen Reactions   Celecoxib Swelling    "Body swelling"   Doxycycline Other (See Comments) and Nausea And Vomiting    "SICKNESS"   Gabapentin  Swelling    Whole body swelling   Latex Itching and Swelling   Nabumetone Itching   Pentazocine Lactate Itching and Other (See Comments)    HALLUCINATIONS   Seroquel [Quetiapine] Other (See Comments)    Falls   Amoxicillin Diarrhea and Nausea And Vomiting   Bactrim [Sulfamethoxazole-Trimethoprim] Other (See Comments)    "Makes very sick"   Cephalexin Other (See Comments) and Hives    Nausea and causes uti's   Erythromycin Other (See Comments)    RESULTANT UTI   Hydromorphone Hcl Other (See Comments)    HALLUCINATIONS   Lyrica [Pregabalin] Other (See Comments)    "Too sleepy"    Review of Systems All review of systems negative except what is listed in the HPI     Objective:    Physical Exam Vitals reviewed.  Constitutional:      Appearance: Normal appearance. She is obese.  Cardiovascular:     Rate and Rhythm: Normal rate and regular rhythm.  Pulmonary:     Effort: Pulmonary effort is normal.     Breath sounds: Normal breath sounds.  Abdominal:     General: Bowel sounds are normal. There is no distension.     Palpations: Abdomen is soft. There is no mass.     Tenderness: There is abdominal tenderness. There is no guarding or rebound.     Hernia: No hernia is present.  Skin:    General: Skin is warm and dry.  Neurological:     General: No focal deficit present.     Mental Status: She is alert and oriented to person, place, and time. Mental status is at baseline.  Psychiatric:        Mood and Affect: Mood normal.        Behavior: Behavior normal.        Thought Content: Thought content normal.        Judgment: Judgment normal.  BP 119/79 (BP Location: Left Arm, Patient Position: Sitting, Cuff Size: Large)   Pulse 90   Temp 98.7 F (37.1 C) (Oral)   Ht _0  (1.626 m)   Wt 245 lb (111.1 kg)   SpO2 96%   BMI 42.05 kg/m  Wt Readings from Last 3 Encounters:  01/15/21 245 lb (111.1 kg)  01/13/21 235 lb (106.6 kg)  11/20/20 245 lb 4.8 oz (111.3 kg)     Health Maintenance Due  Topic Date Due   Hepatitis C Screening  Never done   PAP SMEAR-Modifier  04/11/2020    There are no preventive care reminders to display for this patient.   Lab Results  Component Value Date   TSH 0.623 04/20/2019   Lab Results  Component Value Date   WBC 19.5 (H) 01/13/2021   HGB 14.5 01/13/2021   HCT 42.7 01/13/2021   MCV 92.6 01/13/2021   PLT 297 01/13/2021   Lab Results  Component Value Date   NA 137 01/13/2021   K 4.0 01/13/2021   CO2 26 01/13/2021   GLUCOSE 125 (H) 01/13/2021   BUN 22 (H) 01/13/2021   CREATININE 1.85 (H) 01/13/2021   BILITOT 0.6 01/13/2021   ALKPHOS 96 01/13/2021   AST 18 01/13/2021   ALT 20 01/13/2021   PROT 7.5 01/13/2021   ALBUMIN 3.8 01/13/2021   CALCIUM 9.3 01/13/2021   ANIONGAP 8 01/13/2021   EGFR 76 11/20/2020   Lab Results  Component Value Date   CHOL 205 (H) 08/03/2020   Lab Results  Component Value Date   HDL 44 (L) 08/03/2020   Lab Results  Component Value Date   LDLCALC 134 (H) 08/03/2020   Lab Results  Component Value Date   TRIG 143 08/03/2020   Lab Results  Component Value Date   CHOLHDL 4.7 08/03/2020   Lab Results  Component Value Date   HGBA1C 5.9 (H) 08/03/2020       Assessment & Plan:    1. Nausea Refilling Zofran for occasional nausea. - ondansetron (ZOFRAN) 4 MG tablet; Take 1 tablet (4 mg total) by mouth every 8 (eight) hours as needed for up to 10 doses for nausea or vomiting.  Dispense: 10 tablet; Refill: 0  2. AKI (acute kidney injury) ([place]) 3. Colitis She is trying to stay well-hydrated.  No urinary symptoms.  Rechecking labs today to see if they are trending back towards normal.  Encouraged her to keep her follow-up appointment with GI.  She can follow-up here after she sees GI if needed.  Patient is aware of signs symptoms requiring further/urgent evaluation. For the lung abnormality on CT scan and recommended follow-up, will have patient schedule a 28-month follow-up with PCP to readdress this and repeat CT scan.  Patient is agreeable to plan.  - CBC with Differential - COMPLETE METABOLIC PANEL WITH GFR   Follow-up after GI appointment and as needed.  TPurcell NailsBOlevia Bowens DNP, FNP-C

## 2021-01-15 NOTE — Patient Instructions (Signed)
Rechecking labs today - we will let you know results.  Keep upcoming GI appointment Added follow-up with Joy in about 4 months as a reminder to repeat CT to check on that lung consolidation

## 2021-01-15 NOTE — Telephone Encounter (Signed)
Patient was in ED Friday -Saturday, due to colitis. She was sent home w/ Zofran and Oxycodone 5.325 1 every 8 hours for 5 days breakthru pain as needed. She wanted to let us know.

## 2021-01-16 LAB — CBC WITH DIFFERENTIAL/PLATELET
Absolute Monocytes: 680 cells/uL (ref 200–950)
Basophils Absolute: 32 cells/uL (ref 0–200)
Basophils Relative: 0.4 %
Eosinophils Absolute: 136 cells/uL (ref 15–500)
Eosinophils Relative: 1.7 %
HCT: 39.1 % (ref 35.0–45.0)
Hemoglobin: 13.1 g/dL (ref 11.7–15.5)
Lymphs Abs: 2168 cells/uL (ref 850–3900)
MCH: 30.3 pg (ref 27.0–33.0)
MCHC: 33.5 g/dL (ref 32.0–36.0)
MCV: 90.5 fL (ref 80.0–100.0)
MPV: 12.3 fL (ref 7.5–12.5)
Monocytes Relative: 8.5 %
Neutro Abs: 4984 cells/uL (ref 1500–7800)
Neutrophils Relative %: 62.3 %
Platelets: 251 10*3/uL (ref 140–400)
RBC: 4.32 10*6/uL (ref 3.80–5.10)
RDW: 12.6 % (ref 11.0–15.0)
Total Lymphocyte: 27.1 %
WBC: 8 10*3/uL (ref 3.8–10.8)

## 2021-01-16 LAB — COMPLETE METABOLIC PANEL WITH GFR
AG Ratio: 1.2 (calc) (ref 1.0–2.5)
ALT: 15 U/L (ref 6–29)
AST: 15 U/L (ref 10–35)
Albumin: 3.9 g/dL (ref 3.6–5.1)
Alkaline phosphatase (APISO): 82 U/L (ref 31–125)
BUN/Creatinine Ratio: 7 (calc) (ref 6–22)
BUN: 7 mg/dL (ref 7–25)
CO2: 32 mmol/L (ref 20–32)
Calcium: 9.7 mg/dL (ref 8.6–10.2)
Chloride: 99 mmol/L (ref 98–110)
Creat: 1.03 mg/dL — ABNORMAL HIGH (ref 0.50–0.99)
Globulin: 3.3 g/dL (calc) (ref 1.9–3.7)
Glucose, Bld: 92 mg/dL (ref 65–99)
Potassium: 3.4 mmol/L — ABNORMAL LOW (ref 3.5–5.3)
Sodium: 139 mmol/L (ref 135–146)
Total Bilirubin: 0.3 mg/dL (ref 0.2–1.2)
Total Protein: 7.2 g/dL (ref 6.1–8.1)
eGFR: 68 mL/min/{1.73_m2} (ref >=60)

## 2021-01-19 ENCOUNTER — Encounter: Payer: Self-pay | Admitting: Medical-Surgical

## 2021-01-19 MED ORDER — FLUCONAZOLE 150 MG PO TABS: 150.0000 mg | ORAL_TABLET | Freq: Once | ORAL | 1 refills | Status: AC

## 2021-01-26 DIAGNOSIS — R933 Abnormal findings on diagnostic imaging of other parts of digestive tract: Secondary | ICD-10-CM | POA: Insufficient documentation

## 2021-01-26 DIAGNOSIS — R195 Other fecal abnormalities: Secondary | ICD-10-CM | POA: Insufficient documentation

## 2021-01-27 DIAGNOSIS — Z8371 Family history of colonic polyps: Secondary | ICD-10-CM | POA: Insufficient documentation

## 2021-01-27 DIAGNOSIS — Z83719 Family history of colon polyps, unspecified: Secondary | ICD-10-CM | POA: Insufficient documentation

## 2021-01-27 DIAGNOSIS — Z86010 Personal history of colonic polyps: Secondary | ICD-10-CM

## 2021-01-27 HISTORY — DX: Personal history of colonic polyps: Z86.010

## 2021-01-27 HISTORY — DX: Family history of colon polyps, unspecified: Z83.719

## 2021-01-31 ENCOUNTER — Encounter: Payer: 59 | Admitting: Pain Medicine

## 2021-02-02 ENCOUNTER — Other Ambulatory Visit: Payer: Self-pay | Admitting: Medical-Surgical

## 2021-02-19 ENCOUNTER — Encounter: Payer: Self-pay | Admitting: *Deleted

## 2021-02-19 NOTE — Progress Notes (Deleted)
No show to appointment.

## 2021-02-20 ENCOUNTER — Other Ambulatory Visit: Payer: Self-pay

## 2021-02-20 ENCOUNTER — Ambulatory Visit
Admission: RE | Admit: 2021-02-20 | Discharge: 2021-02-20 | Disposition: A | Discharge status: Home / Self Care | Payer: 59 | Source: Ambulatory Visit | Attending: Gastroenterology | Admitting: Gastroenterology

## 2021-02-20 ENCOUNTER — Encounter: Payer: Self-pay | Admitting: Anesthesiology

## 2021-02-20 ENCOUNTER — Ambulatory Visit: Payer: 59 | Admitting: Anesthesiology

## 2021-02-20 ENCOUNTER — Encounter
Admission: RE | Disposition: A | Discharge status: Home / Self Care | Payer: Self-pay | Source: Ambulatory Visit | Attending: Gastroenterology

## 2021-02-20 DIAGNOSIS — K6289 Other specified diseases of anus and rectum: Secondary | ICD-10-CM | POA: Insufficient documentation

## 2021-02-20 DIAGNOSIS — R933 Abnormal findings on diagnostic imaging of other parts of digestive tract: Secondary | ICD-10-CM | POA: Diagnosis present

## 2021-02-20 DIAGNOSIS — Z87891 Personal history of nicotine dependence: Secondary | ICD-10-CM | POA: Insufficient documentation

## 2021-02-20 DIAGNOSIS — K2970 Gastritis, unspecified, without bleeding: Secondary | ICD-10-CM | POA: Diagnosis not present

## 2021-02-20 DIAGNOSIS — F32A Depression, unspecified: Secondary | ICD-10-CM | POA: Diagnosis not present

## 2021-02-20 DIAGNOSIS — F419 Anxiety disorder, unspecified: Secondary | ICD-10-CM | POA: Diagnosis not present

## 2021-02-20 DIAGNOSIS — I10 Essential (primary) hypertension: Secondary | ICD-10-CM | POA: Diagnosis not present

## 2021-02-20 DIAGNOSIS — M797 Fibromyalgia: Secondary | ICD-10-CM | POA: Insufficient documentation

## 2021-02-20 DIAGNOSIS — M1990 Unspecified osteoarthritis, unspecified site: Secondary | ICD-10-CM | POA: Diagnosis not present

## 2021-02-20 DIAGNOSIS — K219 Gastro-esophageal reflux disease without esophagitis: Secondary | ICD-10-CM | POA: Insufficient documentation

## 2021-02-20 DIAGNOSIS — G8929 Other chronic pain: Secondary | ICD-10-CM | POA: Diagnosis not present

## 2021-02-20 DIAGNOSIS — D122 Benign neoplasm of ascending colon: Secondary | ICD-10-CM | POA: Insufficient documentation

## 2021-02-20 DIAGNOSIS — J45909 Unspecified asthma, uncomplicated: Secondary | ICD-10-CM | POA: Diagnosis not present

## 2021-02-20 DIAGNOSIS — K449 Diaphragmatic hernia without obstruction or gangrene: Secondary | ICD-10-CM | POA: Insufficient documentation

## 2021-02-20 DIAGNOSIS — F1120 Opioid dependence, uncomplicated: Secondary | ICD-10-CM | POA: Diagnosis not present

## 2021-02-20 HISTORY — PX: ESOPHAGOGASTRODUODENOSCOPY: SHX5428

## 2021-02-20 HISTORY — PX: COLONOSCOPY: SHX5424

## 2021-02-20 HISTORY — DX: Gastro-esophageal reflux disease without esophagitis: K21.9

## 2021-02-20 LAB — POCT PREGNANCY, URINE: Preg Test, Ur: NEGATIVE

## 2021-02-20 SURGERY — COLONOSCOPY
Anesthesia: General

## 2021-02-20 MED ORDER — LIDOCAINE HCL (PF) 2 % IJ SOLN
INTRAMUSCULAR | Status: AC
  Filled 2021-02-20: qty 5

## 2021-02-20 MED ORDER — FENTANYL CITRATE (PF) 100 MCG/2ML IJ SOLN
INTRAMUSCULAR | Status: AC
  Filled 2021-02-20: qty 2

## 2021-02-20 MED ORDER — SODIUM CHLORIDE 0.9 % IV SOLN: INTRAVENOUS | Status: DC

## 2021-02-20 MED ORDER — PROPOFOL 500 MG/50ML IV EMUL
INTRAVENOUS | Status: DC | PRN
Start: 2021-02-20 — End: 2021-02-20
  Administered 2021-02-20: 150 ug/kg/min via INTRAVENOUS

## 2021-02-20 MED ORDER — PROPOFOL 500 MG/50ML IV EMUL
INTRAVENOUS | Status: AC
  Filled 2021-02-20: qty 50

## 2021-02-20 MED ORDER — FENTANYL CITRATE (PF) 100 MCG/2ML IJ SOLN
INTRAMUSCULAR | Status: DC | PRN
  Administered 2021-02-20: 50 ug via INTRAVENOUS

## 2021-02-20 NOTE — Anesthesia Procedure Notes (Signed)
Date/Time: 02/20/2021 12:50 PM Performed by: Vaughan Sine Pre-anesthesia Checklist: Patient identified, Emergency Drugs available, Suction available, Patient being monitored and Timeout performed Patient Re-evaluated:Patient Re-evaluated prior to induction Oxygen Delivery Method: Simple face mask Preoxygenation: Pre-oxygenation with 100% oxygen Induction Type: IV induction Airway Equipment and Method: Bite block Placement Confirmation: positive ETCO2 and CO2 detector

## 2021-02-20 NOTE — H&P (Signed)
Outpatient short stay form Pre-procedure 02/20/2021  Mary Rubenstein, MD  Primary Physician: Samuel Bouche, NP  Reason for visit:  Abnormal imaging/Abnormal stool test  History of present illness:    46 y/o lady with history of fibromyalgia and chronic pain requiring spinal cord stimulator who is here for EGD/Colonoscopy for abnormal CT scan showing thickening of colonic wall and fecal calprotectin in the 800's with negative GI stool PCR panel for infectious etiologies. Has had several EGD/Colonoscopies in the past for similar findings with last colonoscopy in 2021.    Current Facility-Administered Medications:    0.9 %  sodium chloride infusion, , Intravenous, Continuous, Breslyn Abdo, Hilton Cork, MD, Last Rate: 20 mL/hr at 02/20/21 1222, New Bag at 02/20/21 1222  Medications Prior to Admission  Medication Sig Dispense Refill Last Dose   amitriptyline (ELAVIL) 100 MG tablet TAKE 1 TABLET BY MOUTH EVERYDAY AT BEDTIME 90 tablet 2 02/19/2021 at 0800   clobetasol ointment (TEMOVATE) 3.29 % Apply 1 application topically 2 (two) times daily. 30 g 0 Past Week   clonazePAM (KLONOPIN) 0.5 MG tablet Take 0.5-1 tablets (0.25-0.5 mg total) by mouth 2 (two) times daily as needed for anxiety. 30 tablet 0 Past Week   DULoxetine (CYMBALTA) 60 MG capsule Take 1 capsule (60 mg total) by mouth 2 (two) times daily. 180 capsule 3 02/19/2021 at 0800   HYDROcodone-acetaminophen (NORCO/VICODIN) 5-325 MG tablet Take by mouth.   02/19/2021 at 0800   hyoscyamine (LEVSIN SL) 0.125 MG SL tablet PLACE 1 TABLET (0.125 MG TOTAL) UNDER THE TONGUE DAILY AS NEEDED. 90 tablet 1 02/19/2021 at 0800   levocetirizine (XYZAL) 5 MG tablet Take 1 tablet (5 mg total) by mouth every evening. 90 tablet 1 02/19/2021 at 2200   lidocaine (XYLOCAINE) 5 % ointment Apply 1 application topically daily. 2 hours before skin procedure. 50 g 11 Past Month   lisinopril-hydrochlorothiazide (ZESTORETIC) 10-12.5 MG tablet Take 1 tablet by mouth daily. 90 tablet 3  02/19/2021 at 0800   methocarbamol (ROBAXIN) 750 MG tablet Take 1 tablet (750 mg total) by mouth every 8 (eight) hours as needed for muscle spasms. 270 tablet 1 02/19/2021 at 0/00   omeprazole (PRILOSEC) 40 MG capsule Take 1 capsule (40 mg total) by mouth daily. 90 capsule 3 02/19/2021 at 0800   ondansetron (ZOFRAN) 4 MG tablet Take 1 tablet (4 mg total) by mouth every 8 (eight) hours as needed for up to 10 doses for nausea or vomiting. 10 tablet 0 Past Week   oxyCODONE (OXY IR/ROXICODONE) 5 MG immediate release tablet Take 1 tablet (5 mg total) by mouth every 6 (six) hours as needed for severe pain. Must last 30 days 120 tablet 0 02/19/2021 at 0800   Probiotic Product (ALIGN PO) Take by mouth daily.   Past Week   rizatriptan (MAXALT) 10 MG tablet Take 1 tablet (10 mg total) by mouth as needed for migraine. May repeat in 2 hours if needed 10 tablet 12 Past Month   topiramate (TOPAMAX) 50 MG tablet TAKE 1/2 TABLET BY MOUTH DAILY 45 tablet 1 02/19/2021 at 0800   famotidine (PEPCID) 20 MG tablet Take 1 tablet (20 mg total) by mouth 2 (two) times daily for 14 days. 28 tablet 0    oxyCODONE (OXY IR/ROXICODONE) 5 MG immediate release tablet Take 1 tablet (5 mg total) by mouth every 6 (six) hours as needed for severe pain. Must last 30 days 120 tablet 0    oxyCODONE (OXY IR/ROXICODONE) 5 MG immediate release tablet Take 1  tablet (5 mg total) by mouth every 6 (six) hours as needed for severe pain. Must last 30 days 120 tablet 0      Allergies  Allergen Reactions   Celecoxib Swelling    "Body swelling"   Doxycycline Other (See Comments) and Nausea And Vomiting    "SICKNESS"   Gabapentin Swelling    Whole body swelling   Latex Itching and Swelling   Nabumetone Itching   Pentazocine Lactate Itching and Other (See Comments)    HALLUCINATIONS   Seroquel [Quetiapine] Other (See Comments)    Falls   Amoxicillin Diarrhea and Nausea And Vomiting   Bactrim [Sulfamethoxazole-Trimethoprim] Other (See Comments)     "Makes very sick"   Cephalexin Other (See Comments) and Hives    Nausea and causes uti's   Erythromycin Other (See Comments)    RESULTANT UTI   Hydromorphone Hcl Other (See Comments)    HALLUCINATIONS   Lyrica [Pregabalin] Other (See Comments)    "Too sleepy"     Past Medical History:  Diagnosis Date   Abscess of axillary fold 10/10/2013   Acute bronchitis with asthma with acute exacerbation 01/23/2015   Dehydration 04/19/2019   Elevated lactic acid level 04/19/2019   Encounter for management of implanted device 12/19/2014   Encounter for therapeutic drug level monitoring 12/19/2014   Fibromyalgia    GERD (gastroesophageal reflux disease)    Hyperglycemia 04/19/2019   Hypertension    Implant pain at battery site (right buttocks area) 04/05/2015   KIDNEY STONES 07/02/2007   Qualifier: History of  By: Bobby Rumpf CMA (AAMA), Patty     Long term current use of opiate analgesic 12/19/2014   Morbid obesity ([place]) 12/19/2014   Muscle cramping 12/19/2014   Opiate use (30 MME/Day) 12/19/2014   Osteoarthritis    Pain    chronic regional pain syndrome   Right knee pain 01/10/2015   Sepsis ([place]) 04/19/2019   Sepsis affecting skin 10/10/2013   Tobacco abuse 04/19/2019    Review of systems:  Otherwise negative.    Physical Exam  Gen: Alert, oriented. Appears stated age.  HEENT: PERRLA. Lungs: No respiratory distress CV: RRR Abd: soft, benign, no masses Ext: No edema    Planned procedures: Proceed with EGD/colonoscopy. The patient understands the nature of the planned procedure, indications, risks, alternatives and potential complications including but not limited to bleeding, infection, perforation, damage to internal organs and possible oversedation/side effects from anesthesia. The patient agrees and gives consent to proceed.  Please refer to procedure notes for findings, recommendations and patient disposition/instructions.     Mary Rubenstein, MD [facility]  Gastroenterology

## 2021-02-20 NOTE — Op Note (Signed)
[facility] Gastroenterology Patient Name: Mary Cruz Procedure Date: 02/20/2021 11:30 AM MRN: [id number] Account #: [id number] Date of Birth: 02/03/1976 Admit Type: Outpatient Age: 46 Room: [facility] ENDO ROOM 1 Gender: Female Note Status: Finalized Instrument Name: Michaelle Birks [id number] Procedure:             Upper GI endoscopy Indications:           Gastro-esophageal reflux disease Providers:             Andrey Farmer MD, MD Referring MD:          Samuel Bouche (Referring MD) Medicines:             Monitored Anesthesia Care Complications:         No immediate complications. Estimated blood loss:                         Minimal. Procedure:             Pre-Anesthesia Assessment:                        - Prior to the procedure, a History and Physical was                         performed, and patient medications and allergies were                         reviewed. The patient is competent. The risks and                         benefits of the procedure and the sedation options and                         risks were discussed with the patient. All questions                         were answered and informed consent was obtained.                         Patient identification and proposed procedure were                         verified by the physician, the nurse, the                         anesthesiologist, the anesthetist and the technician                         in the endoscopy suite. Mental Status Examination:                         alert and oriented. Airway Examination: normal                         oropharyngeal airway and neck mobility. Respiratory                         Examination: clear to auscultation. CV Examination:  normal. Prophylactic Antibiotics: The patient does not                         require prophylactic antibiotics. Prior                         Anticoagulants: The patient has taken no previous                          anticoagulant or antiplatelet agents. ASA Grade                         Assessment: III - A patient with severe systemic                         disease. After reviewing the risks and benefits, the                         patient was deemed in satisfactory condition to                         undergo the procedure. The anesthesia plan was to use                         monitored anesthesia care (MAC). Immediately prior to                         administration of medications, the patient was                         re-assessed for adequacy to receive sedatives. The                         heart rate, respiratory rate, oxygen saturations,                         blood pressure, adequacy of pulmonary ventilation, and                         response to care were monitored throughout the                         procedure. The physical status of the patient was                         re-assessed after the procedure.                        After obtaining informed consent, the endoscope was                         passed under direct vision. Throughout the procedure,                         the patient's blood pressure, pulse, and oxygen                         saturations were monitored continuously. The Endoscope  was introduced through the mouth, and advanced to the                         second part of duodenum. The upper GI endoscopy was                         accomplished without difficulty. The patient tolerated                         the procedure well. Findings:      A 3 cm hiatal hernia was present.      The exam of the esophagus was otherwise normal.      Patchy minimal inflammation characterized by erythema was found in the       gastric antrum. Biopsies were taken with a cold forceps for histology.       Estimated blood loss was minimal.      The examined duodenum was normal. Impression:            - 3 cm hiatal hernia.                        -  Gastritis. Biopsied.                        - Normal examined duodenum. Recommendation:        - Await pathology results.                        - Perform a colonoscopy today. Procedure Code(s):     --- Professional ---                        [phone], Esophagogastroduodenoscopy, flexible,                         transoral; with biopsy, single or multiple Diagnosis Code(s):     --- Professional ---                        K44.9, Diaphragmatic hernia without obstruction or                         gangrene                        K29.70, Gastritis, unspecified, without bleeding                        K21.9, Gastro-esophageal reflux disease without                         esophagitis CPT copyright 2019 [employer] Association. All rights reserved. The codes documented in this report are preliminary and upon coder review may  be revised to meet current compliance requirements. Andrey Farmer MD, MD 02/20/2021 1:20:52 PM Number of Addenda: 0 Note Initiated On: 02/20/2021 11:30 AM Estimated Blood Loss:  Estimated blood loss was minimal.      [facility]

## 2021-02-20 NOTE — Op Note (Signed)
[facility]ology Patient Name: Mary Cruz Procedure Date: 02/20/2021 11:30 AM MRN: [id number] Account #: [id number] Date of Birth: 1975-08-08 Admit Type: Outpatient Age: 46 Room: [facility] ENDO ROOM 1 Gender: Female Note Status: Finalized Instrument Name: Jasper Riling [id number] Procedure:             Colonoscopy Indications:           Abnormal CT of the GI tract Providers:             Andrey Farmer MD, MD Referring MD:          Samuel Bouche (Referring MD) Medicines:             Monitored Anesthesia Care Complications:         No immediate complications. Estimated blood loss:                         Minimal. Procedure:             Pre-Anesthesia Assessment:                        - Prior to the procedure, a History and Physical was                         performed, and patient medications and allergies were                         reviewed. The patient is competent. The risks and                         benefits of the procedure and the sedation options and                         risks were discussed with the patient. All questions                         were answered and informed consent was obtained.                         Patient identification and proposed procedure were                         verified by the physician, the nurse, the                         anesthesiologist, the anesthetist and the technician                         in the endoscopy suite. Mental Status Examination:                         alert and oriented. Airway Examination: normal                         oropharyngeal airway and neck mobility. Respiratory                         Examination: clear to auscultation. CV Examination:  normal. Prophylactic Antibiotics: The patient does not                         require prophylactic antibiotics. Prior                         Anticoagulants: The patient has taken no previous                          anticoagulant or antiplatelet agents. ASA Grade                         Assessment: III - A patient with severe systemic                         disease. After reviewing the risks and benefits, the                         patient was deemed in satisfactory condition to                         undergo the procedure. The anesthesia plan was to use                         monitored anesthesia care (MAC). Immediately prior to                         administration of medications, the patient was                         re-assessed for adequacy to receive sedatives. The                         heart rate, respiratory rate, oxygen saturations,                         blood pressure, adequacy of pulmonary ventilation, and                         response to care were monitored throughout the                         procedure. The physical status of the patient was                         re-assessed after the procedure.                        After obtaining informed consent, the colonoscope was                         passed under direct vision. Throughout the procedure,                         the patient's blood pressure, pulse, and oxygen                         saturations were monitored continuously. The  Colonoscope was introduced through the anus and                         advanced to the the terminal ileum. The colonoscopy                         was performed without difficulty. The patient                         tolerated the procedure well. The quality of the bowel                         preparation was good. Findings:      The perianal and digital rectal examinations were normal.      The terminal ileum appeared normal. Biopsies were taken with a cold       forceps for histology. Estimated blood loss was minimal.      A 1 mm polyp was found in the ascending colon. The polyp was sessile.       The polyp was removed with a cold biopsy forceps. Resection and        retrieval were complete. Estimated blood loss was minimal.      Normal mucosa was found in the sigmoid colon, in the descending colon,       in the transverse colon and in the ascending colon. Biopsies were taken       with a cold forceps for histology. Estimated blood loss was minimal.      A patchy area of granular mucosa was found in the rectum. Biopsies were       taken with a cold forceps for histology. Estimated blood loss was       minimal.      Internal hemorrhoids were found during retroflexion. The hemorrhoids       were Grade I (internal hemorrhoids that do not prolapse).      The exam was otherwise without abnormality on direct and retroflexion       views. Impression:            - The examined portion of the ileum was normal.                         Biopsied.                        - One 1 mm polyp in the ascending colon, removed with                         a cold biopsy forceps. Resected and retrieved.                        - Normal mucosa in the sigmoid colon, in the                         descending colon, in the transverse colon and in the                         ascending colon. Biopsied.                        - Granularity in the rectum. Biopsied.                        -  Internal hemorrhoids.                        - The examination was otherwise normal on direct and                         retroflexion views. Recommendation:        - Discharge patient to home.                        - Resume previous diet.                        - Continue present medications.                        - Await pathology results.                        - Repeat colonoscopy for surveillance based on                         pathology results.                        - Return to referring physician as previously                         scheduled. Procedure Code(s):     --- Professional ---                        [phone], Colonoscopy, flexible; with biopsy, single or                          multiple Diagnosis Code(s):     --- Professional ---                        K64.0, First degree hemorrhoids                        K63.5, Polyp of colon                        K62.89, Other specified diseases of anus and rectum                        R93.3, Abnormal findings on diagnostic imaging of                         other parts of digestive tract CPT copyright 2019 [employer]. All rights reserved. The codes documented in this report are preliminary and upon coder review may  be revised to meet current compliance requirements. Andrey Farmer MD, MD 02/20/2021 1:29:58 PM Number of Addenda: 0 Note Initiated On: 02/20/2021 11:30 AM Scope Withdrawal Time: 0 hours 11 minutes 41 seconds  Total Procedure Duration: 0 hours 19 minutes 8 seconds  Estimated Blood Loss:  Estimated blood loss was minimal.      [facility]

## 2021-02-20 NOTE — Transfer of Care (Signed)
Immediate Anesthesia Transfer of Care Note  Patient: Mary Cruz  Procedure(s) Performed: COLONOSCOPY ESOPHAGOGASTRODUODENOSCOPY (EGD)  Patient Location: PACU  Anesthesia Type:General  Level of Consciousness: awake and sedated  Airway & Oxygen Therapy: Patient Spontanous Breathing and Patient connected to face mask oxygen  Post-op Assessment: Report given to RN and Post -op Vital signs reviewed and stable  Post vital signs: Reviewed and stable  Last Vitals:  Vitals Value Taken Time  BP    Temp    Pulse    Resp    SpO2      Last Pain:  Vitals:   02/20/21 1208  PainSc: 0-No pain         Complications: No notable events documented.

## 2021-02-20 NOTE — Interval H&P Note (Signed)
History and Physical Interval Note:  02/20/2021 12:32 PM  Mary Cruz  has presented today for surgery, with the diagnosis of Elevated fecal calprotectin (R19.5) Abnormal finding on GI tract imaging (R93.3).  The various methods of treatment have been discussed with the patient and family. After consideration of risks, benefits and other options for treatment, the patient has consented to  Procedure(s): COLONOSCOPY (N/A) ESOPHAGOGASTRODUODENOSCOPY (EGD) (N/A) as a surgical intervention.  The patient's history has been reviewed, patient examined, no change in status, stable for surgery.  I have reviewed the patient's chart and labs.  Questions were answered to the patient's satisfaction.     Lesly Rubenstein  Ok to proceed with EGD/Colonoscopy

## 2021-02-21 ENCOUNTER — Encounter: Payer: 59 | Admitting: Pain Medicine

## 2021-02-21 DIAGNOSIS — M797 Fibromyalgia: Secondary | ICD-10-CM

## 2021-02-21 DIAGNOSIS — G894 Chronic pain syndrome: Secondary | ICD-10-CM

## 2021-02-21 DIAGNOSIS — M5459 Other low back pain: Secondary | ICD-10-CM

## 2021-02-21 DIAGNOSIS — Z79891 Long term (current) use of opiate analgesic: Secondary | ICD-10-CM

## 2021-02-21 DIAGNOSIS — M47816 Spondylosis without myelopathy or radiculopathy, lumbar region: Secondary | ICD-10-CM

## 2021-02-21 DIAGNOSIS — Z969 Presence of functional implant, unspecified: Secondary | ICD-10-CM

## 2021-02-21 DIAGNOSIS — Z79899 Other long term (current) drug therapy: Secondary | ICD-10-CM

## 2021-02-21 DIAGNOSIS — G8929 Other chronic pain: Secondary | ICD-10-CM

## 2021-02-21 NOTE — Anesthesia Preprocedure Evaluation (Signed)
Anesthesia Evaluation  Patient identified by MRN, date of birth, ID band Patient awake    Reviewed: Allergy & Precautions, NPO status , Patient's Chart, lab work & pertinent test results  History of Anesthesia Complications Negative for: history of anesthetic complications  Airway Mallampati: II  TM Distance: >3 FB Neck ROM: Full    Dental no notable dental hx. (+) Teeth Intact   Pulmonary asthma , neg sleep apnea, neg COPD, Patient abstained from smoking.Not current smoker, former smoker,    Pulmonary exam normal breath sounds clear to auscultation       Cardiovascular Exercise Tolerance: Good METShypertension, (-) CAD and (-) Past MI (-) dysrhythmias  Rhythm:Regular Rate:Normal - Systolic murmurs    Neuro/Psych PSYCHIATRIC DISORDERS Anxiety Depression  Neuromuscular disease    GI/Hepatic GERD  Medicated and Controlled,(+)     (-) substance abuse  ,   Endo/Other  neg diabetes  Renal/GU negative Renal ROS     Musculoskeletal  (+) Arthritis , Fibromyalgia -, narcotic dependent  Abdominal   Peds  Hematology   Anesthesia Other Findings Past Medical History: 10/10/2013: Abscess of axillary fold 01/23/2015: Acute bronchitis with asthma with acute exacerbation 04/19/2019: Dehydration 04/19/2019: Elevated lactic acid level 12/19/2014: Encounter for management of implanted device 12/19/2014: Encounter for therapeutic drug level monitoring No date: Fibromyalgia No date: GERD (gastroesophageal reflux disease) 04/19/2019: Hyperglycemia No date: Hypertension 04/05/2015: Implant pain at battery site (right buttocks area) 07/02/2007: KIDNEY STONES     Comment:  Qualifier: History of  By: Bobby Rumpf CMA Deborra Medina), Patty   12/19/2014: Long term current use of opiate analgesic 12/19/2014: Morbid obesity ([place]) 12/19/2014: Muscle cramping 12/19/2014: Opiate use (30 MME/Day) No date: Osteoarthritis No date: Pain     Comment:   chronic regional pain syndrome 01/10/2015: Right knee pain 04/19/2019: Sepsis ([place]) 10/10/2013: Sepsis affecting skin 04/19/2019: Tobacco abuse  Reproductive/Obstetrics                             Anesthesia Physical Anesthesia Plan  ASA: 3  Anesthesia Plan: General   Post-op Pain Management: Minimal or no pain anticipated   Induction: Intravenous  PONV Risk Score and Plan: 3 and Propofol infusion, TIVA and Ondansetron  Airway Management Planned: Nasal Cannula  Additional Equipment: None  Intra-op Plan:   Post-operative Plan:   Informed Consent: I have reviewed the patients History and Physical, chart, labs and discussed the procedure including the risks, benefits and alternatives for the proposed anesthesia with the patient or authorized representative who has indicated his/her understanding and acceptance.     Dental advisory given  Plan Discussed with: CRNA and Surgeon  Anesthesia Plan Comments: (Discussed risks of anesthesia with patient, including possibility of difficulty with spontaneous ventilation under anesthesia necessitating airway intervention, PONV, and rare risks such as cardiac or respiratory or neurological events, and allergic reactions. Discussed the role of CRNA in patient's perioperative care. Patient understands.)        Anesthesia Quick Evaluation

## 2021-02-21 NOTE — Anesthesia Postprocedure Evaluation (Signed)
Anesthesia Post Note  Patient: Mary Cruz  Procedure(s) Performed: COLONOSCOPY ESOPHAGOGASTRODUODENOSCOPY (EGD)  Patient location during evaluation: Endoscopy Anesthesia Type: General Level of consciousness: awake and alert Pain management: pain level controlled Vital Signs Assessment: post-procedure vital signs reviewed and stable Respiratory status: spontaneous breathing, nonlabored ventilation, respiratory function stable and patient connected to nasal cannula oxygen Cardiovascular status: blood pressure returned to baseline and stable Postop Assessment: no apparent nausea or vomiting Anesthetic complications: no   No notable events documented.   Last Vitals:  Vitals:   02/20/21 1208 02/20/21 1317  BP: 123/83 115/68  Pulse: (!) 102   Resp: 18   Temp: (!) 35.9 C (!) 35.7 C  SpO2: 98%     Last Pain:  Vitals:   02/20/21 1347  TempSrc:   PainSc: 0-No pain                 Arita Miss

## 2021-02-22 LAB — SURGICAL PATHOLOGY

## 2021-02-25 NOTE — Progress Notes (Addendum)
PROVIDER NOTE: Information contained herein reflects review and annotations entered in association with encounter. Interpretation of such information and data should be left to medically-trained personnel. Information provided to patient can be located elsewhere in the medical record under "Patient Instructions". Document created using STT-dictation technology, any transcriptional errors that may result from process are unintentional.    Patient: Mary Cruz  Service Category: E/M  Provider: Gaspar Cola, MD  DOB: 05/07/1975  DOS: 02/26/2021  Specialty: Interventional Pain Management  MRN: [id number]  Setting: Ambulatory outpatient  PCP: Samuel Bouche, NP  Type: Established Patient    Referring Provider: Samuel Bouche, NP  Location: Office  Delivery: Face-to-face     HPI  [name], a 46 y.o. year old female, is here today because of her Facet syndrome, lumbar [M47.816]. [name] primary complain today is Back Pain (lower) Last encounter: My last encounter with her was on 02/21/2021. Pertinent problems: [name] has Chronic pain syndrome; Chronic musculoskeletal pain; Osteoarthrosis; Presence of functional implant ([employer]tr[employer] Lumbar spinal cord stimulator implant); Chronic low back pain (Bilateral) (R>L) w/o sciatica; Chronic lumbar radicular pain (Right L5 dermatome; Left S1 Dermatome) (Bilateral) (R>L); Chronic neck pain (1ry area of Pain) (Bilateral) (L>R); Chronic cervical radicular pain (C7 Dermatome) (Bilateral) (L>R); Chronic lower extremity pain (Bilateral) (R>L); Lumbar spondylosis; Lumbar facet syndrome (Bilateral) (R>L); Neurogenic pain; Fibromyalgia; Pain in right knee; Chronic upper extremity pain (2ry area of Pain) (Bilateral) (L>R); Chronic hip pain (3ry area of Pain) (Bilateral) (R>L); Closed fracture of scapula, sequela (Right); DDD (degenerative disc disease), cervical; Cervicalgia; Abnormal MRI, shoulder (Right); Spondylosis without myelopathy or radiculopathy,  lumbosacral region; DDD (degenerative disc disease), lumbar; Osteoarthritis involving multiple joints; Chronic low back pain (Right) w/ sciatica (Right); Chronic lower extremity pain (Right); Lumbosacral radiculopathy at L5 (Right); Dropfoot (Right); Lumbar facet arthropathy; Intractable low back pain; Closed fracture of multiple ribs of right side; and Chronic feet pain (Bilateral) on their pertinent problem list. Pain Assessment: Severity of Chronic pain is reported as a 4 /10. Location: Back (back, both hip)  /pain radiaties down to her hip. Onset: More than a month ago. Quality: Constant, Aching, Burning, Throbbing. Timing: Constant. Modifying factor(s): procedures, heat, sitting, elevating her leg. Vitals:  height is 5' 4"  (1.626 m) and weight is 240 lb (108.9 kg). Her temperature is 96.5 F (35.8 C) (abnormal). Her blood pressure is 135/89 and her pulse is 87. Her respiration is 16 and oxygen saturation is 98%.   Reason for encounter: medication management.   The patient indicates doing well with the current medication regimen. No adverse reactions or side effects reported to the medications.  The patient comes into clinic today indicating that her low back pain has began to come back.  She would like to have her lumbar facet radiofrequency ablation repeated.  The left side was last done on 04/08/2019, followed by the right side on 05/13/2019.  As expected, she refers that the left side is worse since it was the first 1 done.  It is very likely that the radiofrequency is wearing off and needs to be repeated.  Physical exam today confirmed that the patient is having bilateral lumbar facet arthralgia one provocative hyperextension or rotation maneuver as well as Mary Cruz.  RTCB: 06/03/2021 Nonopioids transferred 04/06/2020: Robaxin  Pharmacotherapy Assessment  Analgesic: Oxycodone IR 5 mg, 1 tab PO q 6 hrs (20 mg/day of oxycodone) MME/day: 30 mg/day.   Monitoring: [place] PMP: PDMP reviewed during this  encounter.  Pharmacotherapy: No side-effects or adverse reactions reported. Compliance: No problems identified. Effectiveness: Clinically acceptable.  Mary Fischer, RN  02/26/2021 11:48 AM  Signed Nursing Pain Medication Assessment:  Safety precautions to be maintained throughout the outpatient stay will include: orient to surroundings, keep bed in low position, maintain call bell within reach at all times, provide assistance with transfer out of bed and ambulation.  Medication Inspection Compliance: Pill count conducted under aseptic conditions, in front of the patient. Neither the pills nor the bottle was removed from the patient's sight at any time. Once count was completed pills were immediately returned to the patient in their original bottle.  Medication: Oxycodone IR Pill/Patch Count:  72 of 120 pills remain Pill/Patch Appearance: Markings consistent with prescribed medication Bottle Appearance: Standard pharmacy container. Clearly labeled. Filled Date: 39 / 26 / 2022 Last Medication intake:  YesterdaySafety precautions to be maintained throughout the outpatient stay will include: orient to surroundings, keep bed in low position, maintain call bell within reach at all times, provide assistance with transfer out of bed and ambulation.     UDS:  Summary  Date Value Ref Range Status  07/31/2020 Note  Final    Comment:    ==================================================================== [employer] [facility] (MW) ==================================================================== Test                             Result       Flag       Units  Drug Present and Declared for Prescription Verification   Oxycodone                      1766         EXPECTED   ng/mg creat   Oxymorphone                    1326         EXPECTED   ng/mg creat   Noroxycodone                   2915         EXPECTED   ng/mg creat   Noroxymorphone                 492          EXPECTED   ng/mg creat     Sources of oxycodone are scheduled prescription medications.    Oxymorphone, noroxycodone, and noroxymorphone are expected    metabolites of oxycodone. Oxymorphone is also available as a    scheduled prescription medication.  Drug Absent but Declared for Prescription Verification   Clonazepam                     Not Detected UNEXPECTED ng/mg creat ==================================================================== Test                      Result    Flag   Units      Ref Range   Creatinine              205              mg/dL      >=20 ==================================================================== Declared Medications:  The flagging and interpretation on this report are based on the  following declared medications.  Unexpected results may arise from  inaccuracies in the declared medications.   **Note: The testing scope of this panel includes  these medications:   Clonazepam (Klonopin)  Oxycodone (Roxicodone)   **Note: The testing scope of this panel does not include the  following reported medications:   Amitriptyline (Elavil)  Hydrochlorothiazide (Zestoretic)  Hyoscyamine  Lisinopril (Zestoretic)  Methocarbamol (Robaxin)  Omeprazole (Prilosec)  Rizatriptan (Maxalt) ==================================================================== For clinical consultation, please call [phone]. ====================================================================      ROS  Constitutional: Denies any fever or chills Gastrointestinal: No reported hemesis, hematochezia, vomiting, or acute GI distress Musculoskeletal: Denies any acute onset joint swelling, redness, loss of ROM, or weakness Neurological: No reported episodes of acute onset apraxia, aphasia, dysarthria, agnosia, amnesia, paralysis, loss of coordination, or loss of consciousness  Medication Review  DULoxetine, HYDROcodone-acetaminophen, Probiotic Product, amitriptyline, clobetasol ointment, clonazePAM, famotidine,  hyoscyamine, levocetirizine, lidocaine, lisinopril-hydrochlorothiazide, methocarbamol, omeprazole, ondansetron, oxyCODONE, rizatriptan, and topiramate  History Review  Allergy: Ms. Polo is allergic to celecoxib, doxycycline, gabapentin, latex, nabumetone, pentazocine lactate, seroquel [quetiapine], amoxicillin, bactrim [sulfamethoxazole-trimethoprim], cephalexin, erythromycin, hydromorphone hcl, and lyrica [pregabalin]. Drug: Ms. Test  reports no history of drug use. Alcohol:  reports no history of alcohol use. Tobacco:  reports that she quit smoking about 9 months ago. Her smoking use included cigarettes. She quit smokeless tobacco use about 3 years ago. Social: Ms. Vannostrand  reports that she quit smoking about 9 months ago. Her smoking use included cigarettes. She quit smokeless tobacco use about 3 years ago. She reports that she does not drink alcohol and does not use drugs. Medical:  has a past medical history of Abscess of axillary fold (10/10/2013), Acute bronchitis with asthma with acute exacerbation (01/23/2015), Dehydration (04/19/2019), Elevated lactic acid level (04/19/2019), Encounter for management of implanted device (12/19/2014), Encounter for therapeutic drug level monitoring (12/19/2014), Fibromyalgia, GERD (gastroesophageal reflux disease), Hyperglycemia (04/19/2019), Hypertension, Implant pain at battery site (right buttocks area) (04/05/2015), KIDNEY STONES (07/02/2007), Long term current use of opiate analgesic (12/19/2014), Morbid obesity ([place]) (12/19/2014), Muscle cramping (12/19/2014), Opiate use (30 MME/Day) (12/19/2014), Osteoarthritis, Pain, Right knee pain (01/10/2015), Sepsis ([place]) (04/19/2019), Sepsis affecting skin (10/10/2013), and Tobacco abuse (04/19/2019). Surgical: Ms. Leite  has a past surgical history that includes spinal cord stimulator; Occipital nerve stimulator insertion; Cholecystectomy (2000); Knee surgery; Shoulder surgery; Tubal ligation (2000);  Endometrial ablation; Spinal cord stimulator implant (2008); Knee surgery (Right, 02/2015); Breast surgery; Spinal cord stimulator insertion (N/A, 06/21/2016); Shoulder arthroscopy with distal clavicle resection (Right, 10/29/2018); Subacromial decompression (Right, 10/29/2018); laparoscopic appendectomy (Right, 01/01/2020); Appendectomy; Ankle surgery; Colonoscopy with esophagogastroduodenoscopy (egd); Colonoscopy (N/A, 02/20/2021); and Esophagogastroduodenoscopy (N/A, 02/20/2021). Family: family history includes Asthma in her mother; Depression in her mother; Fibromyalgia in an other family member; Stroke in an other family member; Thyroid disease in an other family member.  Laboratory Chemistry Profile   Renal Lab Results  Component Value Date   BUN 7 01/15/2021   CREATININE 1.03 (H) 01/15/2021   LABCREA 51 11/20/2020   BCR 7 01/15/2021   GFRAA 84 08/03/2020   GFRNONAA 34 (L) 01/13/2021    Hepatic Lab Results  Component Value Date   AST 15 01/15/2021   ALT 15 01/15/2021   ALBUMIN 3.8 01/13/2021   ALKPHOS 96 01/13/2021   LIPASE 20 01/13/2021    Electrolytes Lab Results  Component Value Date   NA 139 01/15/2021   K 3.4 (L) 01/15/2021   CL 99 01/15/2021   CALCIUM 9.7 01/15/2021   MG 2.0 04/20/2019   PHOS 3.2 04/20/2019    Bone Lab Results  Component Value Date   VD25OH 25 (L) 10/24/2016    Inflammation (CRP: Acute Phase) (  ESR: Chronic Phase) Lab Results  Component Value Date   CRP 1.0 (H) 04/05/2015   ESRSEDRATE 28 (H) 11/20/2020   LATICACIDVEN 1.8 04/20/2019         Note: Above Lab results reviewed.  Recent Imaging Review  CT ABDOMEN PELVIS W CONTRAST CLINICAL DATA:  Abdominal pain.  Nausea.  EXAM: CT ABDOMEN AND PELVIS WITH CONTRAST  TECHNIQUE: Multidetector CT imaging of the abdomen and pelvis was performed using the standard protocol following bolus administration of intravenous contrast.  CONTRAST:  93m OMNIPAQUE IOHEXOL 300 MG/ML  SOLN  COMPARISON:   CT abdomen dated 12/31/2019.  FINDINGS: Lower chest: New masslike consolidation within the RIGHT lower lobe, measuring 2.3 cm, most likely chronic rounded atelectasis and/or sequela of a previous lung contusion given the overlying healed rib fractures. LEFT lung bases clear.  Hepatobiliary: No focal liver abnormality is seen. Status post cholecystectomy. No biliary dilatation.  Pancreas: Partially infiltrated with fat but otherwise unremarkable.  Spleen: Normal in size without focal abnormality.  Adrenals/Urinary Tract: Adrenal glands are unremarkable. Kidneys appear normal without mass, stone or hydronephrosis. No ureteral or bladder calculi are identified. Bladder is decompressed.  Stomach/Bowel: No dilated large or small bowel loops. Thickening of the walls of the descending colon, with mild pericolonic inflammation. Surgical changes of appendectomy. Stomach is unremarkable.  Vascular/Lymphatic: No significant vascular findings are present. No enlarged abdominal or pelvic lymph nodes.  Reproductive: Uterus and bilateral adnexa are unremarkable.  Other: No abscess collection.  No free intraperitoneal air.  Musculoskeletal: No acute or suspicious osseous abnormality. Old healed rib fractures at the RIGHT lung base.  IMPRESSION: 1. Thickening of the walls of the descending colon, with mild pericolonic inflammation, compatible with a colitis of infectious or inflammatory nature. 2. New masslike consolidation within the RIGHT lower lobe, measuring 2.3 cm, most likely chronic rounded atelectasis and/or sequela of a previous lung contusion given the overlying healed rib fractures. Would consider follow-up chest CT in 3-6 months to ensure stability and prove benignity. 3. No other acute or significant findings within the abdomen or pelvis. No bowel obstruction. No evidence of acute solid organ abnormality. No renal or ureteral calculi.  Electronically Signed   By: SFranki CabotM.D.   On: 01/13/2021 11:14 Note: Reviewed        Physical Exam  General appearance: Well nourished, well developed, and well hydrated. In no apparent acute distress Mental status: Alert, oriented x 3 (person, place, & time)       Respiratory: No evidence of acute respiratory distress Eyes: PERLA Vitals: BP 135/89    Pulse 87    Temp (!) 96.5 F (35.8 C)    Resp 16    Ht 5' 4"  (1.626 m)    Wt 240 lb (108.9 kg)    SpO2 98%    BMI 41.20 kg/m  BMI: Estimated body mass index is 41.2 kg/m as calculated from the following:   Height as of this encounter: 5' 4"  (1.626 m).   Weight as of this encounter: 240 lb (108.9 kg). Ideal: Ideal body weight: 54.7 kg (120 lb 9.5 oz) Adjusted ideal body weight: 76.4 kg (168 lb 5.7 oz)  Assessment   Status Diagnosis  Controlled Controlled Controlled 1. Lumbar facet syndrome (Bilateral) (R>L)   2. Chronic low back pain (Bilateral) (R>L) w/o sciatica   3. Spondylosis without myelopathy or radiculopathy, lumbosacral region   4. Chronic neck pain (1ry area of Pain) (Bilateral) (L>R)   5. Chronic upper extremity pain (2ry area  of Pain) (Bilateral) (L>R)   6. Chronic hip pain (3ry area of Pain) (Bilateral) (R>L)   7. Intractable low back pain   8. Fibromyalgia   9. Presence of functional implant (Medtronic Lumbar spinal cord stimulator implant)   10. Chronic pain syndrome   11. Pharmacologic therapy   12. Chronic use of opiate for therapeutic purpose   13. Encounter for medication management      Updated Problems: No problems updated.  Plan of Care  Problem-specific:  No problem-specific Assessment & Plan notes found for this encounter.  Ms. MIEKA LEATON has a current medication list which includes the following long-term medication(s): amitriptyline, clonazepam, duloxetine, hyoscyamine, levocetirizine, lisinopril-hydrochlorothiazide, methocarbamol, omeprazole, rizatriptan, topiramate, famotidine, [START ON 05/04/2021] oxycodone, [START  ON 03/05/2021] oxycodone, and [START ON 04/04/2021] oxycodone.  Pharmacotherapy (Medications Ordered): Meds ordered this encounter  Medications   DISCONTD: oxyCODONE (OXY IR/ROXICODONE) 5 MG immediate release tablet    Sig: Take 1 tablet (5 mg total) by mouth every 6 (six) hours as needed for severe pain. Must last 30 days    Dispense:  120 tablet    Refill:  0    DO NOT: delete (not duplicate); no partial-fill (will deny script to complete), no refill request (F/U required). DISPENSE: 1 day early if closed on fill date. WARN: No CNS-depressants within 8 hrs of med.   DISCONTD: oxyCODONE (OXY IR/ROXICODONE) 5 MG immediate release tablet    Sig: Take 1 tablet (5 mg total) by mouth every 6 (six) hours as needed for severe pain. Must last 30 days    Dispense:  120 tablet    Refill:  0    DO NOT: delete (not duplicate); no partial-fill (will deny script to complete), no refill request (F/U required). DISPENSE: 1 day early if closed on fill date. WARN: No CNS-depressants within 8 hrs of med.   DISCONTD: oxyCODONE (OXY IR/ROXICODONE) 5 MG immediate release tablet    Sig: Take 1 tablet (5 mg total) by mouth every 6 (six) hours as needed for severe pain. Must last 30 days    Dispense:  120 tablet    Refill:  0    DO NOT: delete (not duplicate); no partial-fill (will deny script to complete), no refill request (F/U required). DISPENSE: 1 day early if closed on fill date. WARN: No CNS-depressants within 8 hrs of med.   oxyCODONE (OXY IR/ROXICODONE) 5 MG immediate release tablet    Sig: Take 1 tablet (5 mg total) by mouth every 6 (six) hours as needed for severe pain. Must last 30 days    Dispense:  120 tablet    Refill:  0    DO NOT: delete (not duplicate); no partial-fill (will deny script to complete), no refill request (F/U required). DISPENSE: 1 day early if closed on fill date. WARN: No CNS-depressants within 8 hrs of med.   oxyCODONE (OXY IR/ROXICODONE) 5 MG immediate release tablet    Sig: Take  1 tablet (5 mg total) by mouth every 6 (six) hours as needed for severe pain. Must last 30 days    Dispense:  120 tablet    Refill:  0    DO NOT: delete (not duplicate); no partial-fill (will deny script to complete), no refill request (F/U required). DISPENSE: 1 day early if closed on fill date. WARN: No CNS-depressants within 8 hrs of med.   oxyCODONE (OXY IR/ROXICODONE) 5 MG immediate release tablet    Sig: Take 1 tablet (5 mg total) by mouth every 6 (six)  hours as needed for severe pain. Must last 30 days    Dispense:  120 tablet    Refill:  0    DO NOT: delete (not duplicate); no partial-fill (will deny script to complete), no refill request (F/U required). DISPENSE: 1 day early if closed on fill date. WARN: No CNS-depressants within 8 hrs of med.   Orders:  Orders Placed This Encounter  Procedures   Radiofrequency,Lumbar    Standing Status:   Future    Standing Expiration Date:   05/27/2021    Scheduling Instructions:     Side(s): Left-sided     Level: L3-4, L4-5, & L5-S1 Facets (L2, L3, L4, L5, & S1 Medial Branch Nerves)     Sedation: Patient's choice.     Scheduling Timeframe: As soon as pre-approved    Order Specific Question:   Where will this procedure be performed?    Answer:   [facility]   Follow-up plan:   Return for (38mn), (ECT) procedure: (L) L-FCT RFA #3, (Sed-anx).     Interventional Therapies  Risk   Complexity Considerations:   Estimated body mass index is 41.2 kg/m as calculated from the following:   Height as of this encounter: 5' 4"  (1.626 m).   Weight as of this encounter: 240 lb (108.9 kg). WNL   Planned   Pending:   Therapeutic left lumbar facet RFA #3    Under consideration:   Diagnostic right suprascapular nerve block #1  Repeat therapeutic bilateral lumbar facet RFA   Completed:   Palliative bilateral lumbar facet MBB x2 (11/16/2020) (90/90/80/85-90)  Therapeutic left lumbar facet MBB x2 (10/10/2016) (100/100/100/100)  Therapeutic  right lumbar facet MBB x1 (08/31/2015) (n/a)  Therapeutic right lumbar facet RFA x2 (05/13/2019) (70/70/60/>50)  Therapeutic left lumbar facet RFA x2 (04/08/2019) 100/100/90)  Palliative left C7-T1 CESI x1 (04/13/2015) (n/a)    Therapeutic   Palliative (PRN) options:   Palliative bilateral lumbar facet MBB  Palliative right lumbar facet RFA  Palliative left lumbar facet RFA  Palliative left C7-T1 CESI  PRN SCS programming adjustments (2018 replacement by Dr. PClydell Hakim    Recent Visits Date Type Provider Dept  02/26/21 Office Visit NMilinda Pointer MD [facility]  12/28/20 Office Visit NMilinda Pointer MD [facility]  Showing recent visits within past 90 days and meeting all other requirements Future Appointments Date Type Provider Dept  05/21/21 Appointment NMilinda Pointer MD [facility]  Showing future appointments within next 90 days and meeting all other requirements  I discussed the assessment and treatment plan with the patient. The patient was provided an opportunity to ask questions and all were answered. The patient agreed with the plan and demonstrated an understanding of the instructions.  Patient advised to call back or seek an in-person evaluation if the symptoms or condition worsens.  Duration of encounter: 30 minutes.  Note by: FGaspar Cola MD Date: 02/26/2021; Time: 9:05 AM

## 2021-02-26 ENCOUNTER — Encounter: Payer: Self-pay | Admitting: Pain Medicine

## 2021-02-26 ENCOUNTER — Other Ambulatory Visit: Payer: Self-pay

## 2021-02-26 ENCOUNTER — Ambulatory Visit: Payer: 59 | Attending: Pain Medicine | Admitting: Pain Medicine

## 2021-02-26 VITALS — BP 135/89 | HR 87 | Temp 96.5°F | Resp 16 | Ht 64.0 in | Wt 240.0 lb

## 2021-02-26 DIAGNOSIS — M79602 Pain in left arm: Secondary | ICD-10-CM | POA: Diagnosis present

## 2021-02-26 DIAGNOSIS — Z79891 Long term (current) use of opiate analgesic: Secondary | ICD-10-CM | POA: Diagnosis present

## 2021-02-26 DIAGNOSIS — Z79899 Other long term (current) drug therapy: Secondary | ICD-10-CM | POA: Diagnosis present

## 2021-02-26 DIAGNOSIS — M25551 Pain in right hip: Secondary | ICD-10-CM | POA: Insufficient documentation

## 2021-02-26 DIAGNOSIS — M797 Fibromyalgia: Secondary | ICD-10-CM | POA: Diagnosis present

## 2021-02-26 DIAGNOSIS — M5459 Other low back pain: Secondary | ICD-10-CM

## 2021-02-26 DIAGNOSIS — G8929 Other chronic pain: Secondary | ICD-10-CM | POA: Diagnosis present

## 2021-02-26 DIAGNOSIS — M542 Cervicalgia: Secondary | ICD-10-CM | POA: Diagnosis not present

## 2021-02-26 DIAGNOSIS — Z969 Presence of functional implant, unspecified: Secondary | ICD-10-CM

## 2021-02-26 DIAGNOSIS — M545 Low back pain, unspecified: Secondary | ICD-10-CM | POA: Insufficient documentation

## 2021-02-26 DIAGNOSIS — G894 Chronic pain syndrome: Secondary | ICD-10-CM | POA: Diagnosis present

## 2021-02-26 DIAGNOSIS — M47817 Spondylosis without myelopathy or radiculopathy, lumbosacral region: Secondary | ICD-10-CM | POA: Diagnosis not present

## 2021-02-26 DIAGNOSIS — M79601 Pain in right arm: Secondary | ICD-10-CM | POA: Diagnosis present

## 2021-02-26 DIAGNOSIS — M47816 Spondylosis without myelopathy or radiculopathy, lumbar region: Secondary | ICD-10-CM | POA: Diagnosis not present

## 2021-02-26 DIAGNOSIS — M25552 Pain in left hip: Secondary | ICD-10-CM | POA: Diagnosis present

## 2021-02-26 MED ORDER — OXYCODONE HCL 5 MG PO TABS: 5.0000 mg | ORAL_TABLET | Freq: Four times a day (QID) | ORAL | 0 refills | Status: DC | PRN

## 2021-02-26 NOTE — Progress Notes (Signed)
Nursing Pain Medication Assessment:  Safety precautions to be maintained throughout the outpatient stay will include: orient to surroundings, keep bed in low position, maintain call bell within reach at all times, provide assistance with transfer out of bed and ambulation.  Medication Inspection Compliance: Pill count conducted under aseptic conditions, in front of the patient. Neither the pills nor the bottle was removed from the patient's sight at any time. Once count was completed pills were immediately returned to the patient in their original bottle.  Medication: Oxycodone IR Pill/Patch Count:  72 of 120 pills remain Pill/Patch Appearance: Markings consistent with prescribed medication Bottle Appearance: Standard pharmacy container. Clearly labeled. Filled Date: 69 / 26 / 2022 Last Medication intake:  YesterdaySafety precautions to be maintained throughout the outpatient stay will include: orient to surroundings, keep bed in low position, maintain call bell within reach at all times, provide assistance with transfer out of bed and ambulation.

## 2021-02-27 ENCOUNTER — Encounter: Payer: Self-pay | Admitting: Pain Medicine

## 2021-02-27 ENCOUNTER — Telehealth: Payer: Self-pay | Admitting: *Deleted

## 2021-02-27 MED ORDER — OXYCODONE HCL 5 MG PO TABS: 5.0000 mg | ORAL_TABLET | Freq: Four times a day (QID) | ORAL | 0 refills | Status: DC | PRN

## 2021-02-27 NOTE — Telephone Encounter (Signed)
I have sent a bubble message to Dr. Dossie Arbour.

## 2021-02-27 NOTE — Telephone Encounter (Signed)
Called [facility], cancelled 3 prescriptions for Oxycodone, written 02-26-21. Notified patient per MyChart that they have beens sent to [facility] in [place].

## 2021-02-27 NOTE — Addendum Note (Signed)
Addended by: Milinda Pointer A on: 02/27/2021 09:05 AM   Modules accepted: Orders

## 2021-02-28 ENCOUNTER — Encounter: Payer: 59 | Admitting: Pain Medicine

## 2021-03-03 ENCOUNTER — Other Ambulatory Visit: Payer: Self-pay | Admitting: Medical-Surgical

## 2021-03-09 ENCOUNTER — Encounter: Payer: Self-pay | Admitting: Medical-Surgical

## 2021-03-09 ENCOUNTER — Encounter: Payer: Self-pay | Admitting: Pain Medicine

## 2021-03-17 ENCOUNTER — Other Ambulatory Visit: Payer: Self-pay | Admitting: Medical-Surgical

## 2021-03-21 ENCOUNTER — Telehealth: Payer: Self-pay

## 2021-03-21 NOTE — Telephone Encounter (Signed)
PA done through [facility] by [facility] on 03/21/2021

## 2021-03-21 NOTE — Telephone Encounter (Signed)
The patient called to let you know her oxycodone will need prior auth

## 2021-03-21 NOTE — Progress Notes (Signed)
PROVIDER NOTE: Interpretation of information contained herein should be left to medically-trained personnel. Specific patient instructions are provided elsewhere under "Patient Instructions" section of medical record. This document was created in part using STT-dictation technology, any transcriptional errors that may result from this process are unintentional.  Patient: Mary Cruz Type: Established DOB: Oct 07, 1975 MRN: [id number] PCP: Samuel Bouche, NP  Service: Procedure DOS: 03/22/2021 Setting: Ambulatory Location: Ambulatory outpatient facility Delivery: Face-to-face Provider: Gaspar Cola, MD Specialty: Interventional Pain Management Specialty designation: 09 Location: Outpatient facility Ref. Prov.: Milinda Pointer, MD    Primary Reason for Visit: Interventional Pain Management Treatment. CC: Back Pain (low)  Procedure:           Type: Lumbar Facet, Medial Branch Radiofrequency Ablation (RFA) #3  Laterality: Left  Level: L2, L3, L4, L5, & S1 Medial Branch Level(s). These levels will denervate the L3-4 and L5-S1 lumbar facet joints.  Imaging: Fluoroscopic guidance Anesthesia: Local anesthesia (1-2% Lidocaine) Anxiolysis: IV Versed         Sedation: Moderate conscious sedation. DOS: 03/22/2021  Performed by: Gaspar Cola, MD  Purpose: Therapeutic/Palliative Indications: Low back pain severe enough to impact quality of life or function. Indications: 1. Lumbar facet syndrome (Bilateral) (R>L)   2. Spondylosis without myelopathy or radiculopathy, lumbosacral region   3. Lumbar facet arthropathy   4. DDD (degenerative disc disease), lumbar   5. Chronic low back pain (Bilateral) (R>L) w/o sciatica    Latex precautions, history of latex allergy    Morbid obesity ([place])    Mary Cruz has been dealing with the above chronic pain for longer than three months and has either failed to respond, was unable to tolerate, or simply did not get enough benefit from other  more conservative therapies including, but not limited to: 1. Over-the-counter medications 2. Anti-inflammatory medications 3. Muscle relaxants 4. Membrane stabilizers 5. Opioids 6. Physical therapy and/or chiropractic manipulation 7. Modalities (Heat, ice, etc.) 8. Invasive techniques such as nerve blocks. Mary Cruz has attained more than 50% relief of the pain from a series of diagnostic injections conducted in separate occasions.  Pain Score: Pre-procedure: 4 /10 Post-procedure: 0-No pain/10     Position / Prep / Materials:  Position: Prone  Prep solution: DuraPrep (Iodine Povacrylex [0.7% available iodine] and Isopropyl Alcohol, 74% w/w) Prep Area: Entire Lumbosacral Region (Lower back from mid-thoracic region to end of tailbone and from flank to flank.) Materials:  Tray: RFA (Radiofrequency) tray Needle(s):  Type: RFA (Teflon-coated radiofrequency ablation needles) Gauge (G): 20  Length: Long (15cm) Qty: 5  Pre-op H&P Assessment:  Mary Cruz is a 46 y.o. (year old), female patient, seen today for interventional treatment. She  has a past surgical history that includes spinal cord stimulator; Occipital nerve stimulator insertion; Cholecystectomy (2000); Knee surgery; Shoulder surgery; Tubal ligation (2000); Endometrial ablation; Spinal cord stimulator implant (2008); Knee surgery (Right, 02/2015); Breast surgery; Spinal cord stimulator insertion (N/A, 06/21/2016); Shoulder arthroscopy with distal clavicle resection (Right, 10/29/2018); Subacromial decompression (Right, 10/29/2018); laparoscopic appendectomy (Right, 01/01/2020); Appendectomy; Ankle surgery; Colonoscopy with esophagogastroduodenoscopy (egd); Colonoscopy (N/A, 02/20/2021); and Esophagogastroduodenoscopy (N/A, 02/20/2021). Mary Cruz has a current medication list which includes the following prescription(s): amitriptyline, duloxetine, famotidine, hydrocodone-acetaminophen, [START ON 03/29/2021]  hydrocodone-acetaminophen, hyoscyamine, levocetirizine, lisinopril-hydrochlorothiazide, methocarbamol, omeprazole, [START ON 05/04/2021] oxycodone, oxycodone, [START ON 04/04/2021] oxycodone, probiotic product, rizatriptan, topiramate, clobetasol ointment, clonazepam, lidocaine, and ondansetron, and the following Facility-Administered Medications: fentanyl and pentafluoroprop-tetrafluoroeth. Her primarily concern today is the Back Pain (low)  Initial Vital Signs:  Pulse/HCG Rate:  96ECG Heart Rate: 90 Temp: 97.6 F (36.4 C) Resp: 16 BP: 108/66 SpO2: 99 %  BMI: Estimated body mass index is 41.54 kg/m as calculated from the following:   Height as of this encounter: 5\' 4"  (1.626 m).   Weight as of this encounter: 242 lb (109.8 kg).  Risk Assessment: Allergies: Reviewed. She is allergic to celecoxib, doxycycline, gabapentin, latex, nabumetone, pentazocine lactate, seroquel [quetiapine], amoxicillin, bactrim [sulfamethoxazole-trimethoprim], cephalexin, erythromycin, hydromorphone hcl, and lyrica [pregabalin].  Allergy Precautions: None required Coagulopathies: Reviewed. None identified.  Blood-thinner therapy: None at this time Active Infection(s): Reviewed. None identified. Mary Cruz is afebrile  Site Confirmation: Mary Cruz was asked to confirm the procedure and laterality before marking the site Procedure checklist: Completed Consent: Before the procedure and under the influence of no sedative(s), amnesic(s), or anxiolytics, the patient was informed of the treatment options, risks and possible complications. To fulfill our ethical and legal obligations, as recommended by the American Medical Association's Code of Ethics, I have informed the patient of my clinical impression; the nature and purpose of the treatment or procedure; the risks, benefits, and possible complications of the intervention; the alternatives, including doing nothing; the risk(s) and benefit(s) of the alternative  treatment(s) or procedure(s); and the risk(s) and benefit(s) of doing nothing. The patient was provided information about the general risks and possible complications associated with the procedure. These may include, but are not limited to: failure to achieve desired goals, infection, bleeding, organ or nerve damage, allergic reactions, paralysis, and death. In addition, the patient was informed of those risks and complications associated to Spine-related procedures, such as failure to decrease pain; infection (i.e.: Meningitis, epidural or intraspinal abscess); bleeding (i.e.: epidural hematoma, subarachnoid hemorrhage, or any other type of intraspinal or peri-dural bleeding); organ or nerve damage (i.e.: Any type of peripheral nerve, nerve root, or spinal cord injury) with subsequent damage to sensory, motor, and/or autonomic systems, resulting in permanent pain, numbness, and/or weakness of one or several areas of the body; allergic reactions; (i.e.: anaphylactic reaction); and/or death. Furthermore, the patient was informed of those risks and complications associated with the medications. These include, but are not limited to: allergic reactions (i.e.: anaphylactic or anaphylactoid reaction(s)); adrenal axis suppression; blood sugar elevation that in diabetics may result in ketoacidosis or comma; water retention that in patients with history of congestive heart failure may result in shortness of breath, pulmonary edema, and decompensation with resultant heart failure; weight gain; swelling or edema; medication-induced neural toxicity; particulate matter embolism and blood vessel occlusion with resultant organ, and/or nervous system infarction; and/or aseptic necrosis of one or more joints. Finally, the patient was informed that Medicine is not an exact science; therefore, there is also the possibility of unforeseen or unpredictable risks and/or possible complications that may result in a catastrophic  outcome. The patient indicated having understood very clearly. We have given the patient no guarantees and we have made no promises. Enough time was given to the patient to ask questions, all of which were answered to the patient's satisfaction. Ms. Brossart has indicated that she wanted to continue with the procedure. Attestation: I, the ordering provider, attest that I have discussed with the patient the benefits, risks, side-effects, alternatives, likelihood of achieving goals, and potential problems during recovery for the procedure that I have provided informed consent. Date   Time: 03/22/2021  8:47 AM  Pre-Procedure Preparation:  Monitoring: As per clinic protocol. Respiration, ETCO2, SpO2, BP, heart rate and rhythm monitor placed and checked for adequate function  Safety Precautions: Patient was assessed for positional comfort and pressure points before starting the procedure. Time-out: I initiated and conducted the "Time-out" before starting the procedure, as per protocol. The patient was asked to participate by confirming the accuracy of the "Time Out" information. Verification of the correct person, site, and procedure were performed and confirmed by me, the nursing staff, and the patient. "Time-out" conducted as per Joint Commission's Universal Protocol (UP.01.01.01). Time: 0945  Description of Procedure:          Laterality: Left Levels:  L2, L3, L4, L5, & S1 Medial Branch Level(s), at the L3-4 and L5-S1 lumbar facet joints. Safety Precautions: Aspiration looking for blood return was conducted prior to all injections. At no point did we inject any substances, as a needle was being advanced. Before injecting, the patient was told to immediately notify me if she was experiencing any new onset of "ringing in the ears, or metallic taste in the mouth". No attempts were made at seeking any paresthesias. Safe injection practices and needle disposal techniques used. Medications properly checked for  expiration dates. SDV (single dose vial) medications used. After the completion of the procedure, all disposable equipment used was discarded in the proper designated medical waste containers. Local Anesthesia: Protocol guidelines were followed. The patient was positioned over the fluoroscopy table. The area was prepped in the usual manner. The time-out was completed. The target area was identified using fluoroscopy. A 12-in long, straight, sterile hemostat was used with fluoroscopic guidance to locate the targets for each level blocked. Once located, the skin was marked with an approved surgical skin marker. Once all sites were marked, the skin (epidermis, dermis, and hypodermis), as well as deeper tissues (fat, connective tissue and muscle) were infiltrated with a small amount of a short-acting local anesthetic, loaded on a 10cc syringe with a 25G, 1.5-in  Needle. An appropriate amount of time was allowed for local anesthetics to take effect before proceeding to the next step. Technical description of process:  Radiofrequency Ablation (RFA) L2 Medial Branch Nerve RFA: The target area for the L2 medial branch is at the junction of the postero-lateral aspect of the superior articular process and the superior, posterior, and medial edge of the transverse process of L3. Under fluoroscopic guidance, a Radiofrequency needle was inserted until contact was made with os over the superior postero-lateral aspect of the pedicular shadow (target area). Sensory and motor testing was conducted to properly adjust the position of the needle. Once satisfactory placement of the needle was achieved, the numbing solution was slowly injected after negative aspiration for blood. 2.0 mL of the nerve block solution was injected without difficulty or complication. After waiting for at least 3 minutes, the ablation was performed. Once completed, the needle was removed intact. L3 Medial Branch Nerve RFA: The target area for the L3  medial branch is at the junction of the postero-lateral aspect of the superior articular process and the superior, posterior, and medial edge of the transverse process of L4. Under fluoroscopic guidance, a Radiofrequency needle was inserted until contact was made with os over the superior postero-lateral aspect of the pedicular shadow (target area). Sensory and motor testing was conducted to properly adjust the position of the needle. Once satisfactory placement of the needle was achieved, the numbing solution was slowly injected after negative aspiration for blood. 2.0 mL of the nerve block solution was injected without difficulty or complication. After waiting for at least 3 minutes, the ablation was performed. Once completed, the needle  was removed intact. L4 Medial Branch Nerve RFA: The target area for the L4 medial branch is at the junction of the postero-lateral aspect of the superior articular process and the superior, posterior, and medial edge of the transverse process of L5. Under fluoroscopic guidance, a Radiofrequency needle was inserted until contact was made with os over the superior postero-lateral aspect of the pedicular shadow (target area). Sensory and motor testing was conducted to properly adjust the position of the needle. Once satisfactory placement of the needle was achieved, the numbing solution was slowly injected after negative aspiration for blood. 2.0 mL of the nerve block solution was injected without difficulty or complication. After waiting for at least 3 minutes, the ablation was performed. Once completed, the needle was removed intact. L5 Medial Branch Nerve RFA: The target area for the L5 medial branch is at the junction of the postero-lateral aspect of the superior articular process of S1 and the superior, posterior, and medial edge of the sacral ala. Under fluoroscopic guidance, a Radiofrequency needle was inserted until contact was made with os over the superior postero-lateral  aspect of the pedicular shadow (target area). Sensory and motor testing was conducted to properly adjust the position of the needle. Once satisfactory placement of the needle was achieved, the numbing solution was slowly injected after negative aspiration for blood. 2.0 mL of the nerve block solution was injected without difficulty or complication. After waiting for at least 3 minutes, the ablation was performed. Once completed, the needle was removed intact. S1 Medial Branch Nerve RFA: The target area for the S1 medial branch is located inferior to the junction of the S1 superior articular process and the L5 inferior articular process, posterior, inferior, and lateral to the 6 o'clock position of the L5-S1 facet joint, just superior to the S1 posterior foramen. Under fluoroscopic guidance, the Radiofrequency needle was advanced until contact was made with os over the Target area. Sensory and motor testing was conducted to properly adjust the position of the needle. Once satisfactory placement of the needle was achieved, the numbing solution was slowly injected after negative aspiration for blood. 2.0 mL of the nerve block solution was injected without difficulty or complication. After waiting for at least 3 minutes, the ablation was performed. Once completed, the needle was removed intact. Radiofrequency lesioning (ablation):  Radiofrequency Generator: Medtronic AccurianTM AG 1000 RF Generator Sensory Stimulation Parameters: 50 Hz was used to locate & identify the nerve, making sure that the needle was positioned such that there was no sensory stimulation below 0.3 V or above 0.7 V. Motor Stimulation Parameters: 2 Hz was used to evaluate the motor component. Care was taken not to lesion any nerves that demonstrated motor stimulation of the lower extremities at an output of less than 2.5 times that of the sensory threshold, or a maximum of 2.0 V. Lesioning Technique Parameters: Standard Radiofrequency  settings. (Not bipolar or pulsed.) Temperature Settings: 80 degrees C Lesioning time: 60 seconds Intra-operative Compliance: Compliant  Once the entire procedure was completed, the treated area was cleaned, making sure to leave some of the prepping solution back to take advantage of its long term bactericidal properties.    Illustration of the posterior view of the lumbar spine and the posterior neural structures. Laminae of L2 through S1 are labeled. DPRL5, dorsal primary ramus of L5; DPRS1, dorsal primary ramus of S1; DPR3, dorsal primary ramus of L3; FJ, facet (zygapophyseal) joint L3-L4; I, inferior articular process of L4; LB1, lateral branch of dorsal primary  ramus of L1; IAB, inferior articular branches from L3 medial branch (supplies L4-L5 facet joint); IBP, intermediate branch plexus; MB3, medial branch of dorsal primary ramus of L3; NR3, third lumbar nerve root; S, superior articular process of L5; SAB, superior articular branches from L4 (supplies L4-5 facet joint also); TP3, transverse process of L3.  Vitals:   03/22/21 1020 03/22/21 1031 03/22/21 1040 03/22/21 1050  BP: 106/81 (!) 95/41 (!) 98/42 (!) 97/53  Pulse:      Resp: 11  12 15   Temp:      TempSrc:      SpO2: 95%  98% 99%  Weight:      Height:       Start Time: 0945 hrs. End Time: 1020 hrs.  Imaging Guidance (Spinal):          Type of Imaging Technique: Fluoroscopy Guidance (Spinal) Indication(s): Assistance in needle guidance and placement for procedures requiring needle placement in or near specific anatomical locations not easily accessible without such assistance. Exposure Time: Please see nurses notes. Contrast: None used. Fluoroscopic Guidance: I was personally present during the use of fluoroscopy. "Tunnel Vision Technique" used to obtain the best possible view of the target area. Parallax error corrected before commencing the procedure. "Direction-depth-direction" technique used to introduce the needle under  continuous pulsed fluoroscopy. Once target was reached, antero-posterior, oblique, and lateral fluoroscopic projection used confirm needle placement in all planes. Images permanently stored in EMR. Interpretation: No contrast injected. I personally interpreted the imaging intraoperatively. Adequate needle placement confirmed in multiple planes. Permanent images saved into the patient's record.  Antibiotic Prophylaxis:   Anti-infectives (From admission, onward)    None      Indication(s): None identified  Post-operative Assessment:  Post-procedure Vital Signs:  Pulse/HCG Rate: 9681 Temp: 97.6 F (36.4 C) Resp: 15 BP: (!) 97/53 SpO2: 99 %  EBL: None  Complications: No immediate post-treatment complications observed by team, or reported by patient.  Note: The patient tolerated the entire procedure well. A repeat set of vitals were taken after the procedure and the patient was kept under observation following institutional policy, for this type of procedure. Post-procedural neurological assessment was performed, showing return to baseline, prior to discharge. The patient was provided with post-procedure discharge instructions, including a section on how to identify potential problems. Should any problems arise concerning this procedure, the patient was given instructions to immediately contact us, at any time, without hesitation. In any case, we plan to contact the patient by telephone for a follow-up status report regarding this interventional procedure.  Comments:  No additional relevant information.  Plan of Care  Orders:  Orders Placed This Encounter  Procedures   Radiofrequency,Lumbar    Scheduling Instructions:     Side(s): Left-sided     Level: L3-4, L4-5, & L5-S1 Facets (L2, L3, L4, L5, & S1 Medial Branch Nerves)     Sedation: With Sedation.     Timeframe: Today    Order Specific Question:   Where will this procedure be performed?    Answer:   [facility]    Radiofrequency,Lumbar    Standing Status:   Future    Standing Expiration Date:   06/19/2021    Scheduling Instructions:     Side(s): Right-sided     Level: L3-4, L4-5, & L5-S1 Facets (L2, L3, L4, L5, & S1 Medial Branch Nerves)     Sedation: With Sedation.     Scheduling Timeframe: 6 weeks from now    Order Specific Question:  Where will this procedure be performed?    Answer:   [facility]   DG PAIN CLINIC C-ARM 1-60 MIN NO REPORT    Intraoperative interpretation by procedural physician at [place].    Standing Status:   Standing    Number of Occurrences:   1    Order Specific Question:   Reason for exam:    Answer:   Assistance in needle guidance and placement for procedures requiring needle placement in or near specific anatomical locations not easily accessible without such assistance.   Informed Consent Details: Physician/Practitioner Attestation; Transcribe to consent form and obtain patient signature    Nursing Order: Transcribe to consent form and obtain patient signature. Note: Always confirm laterality of pain with Ms. Maeda, before procedure.    Order Specific Question:   Physician/Practitioner attestation of informed consent for procedure/surgical case    Answer:   I, the physician/practitioner, attest that I have discussed with the patient the benefits, risks, side effects, alternatives, likelihood of achieving goals and potential problems during recovery for the procedure that I have provided informed consent.    Order Specific Question:   Procedure    Answer:   Lumbar Facet Radiofrequency Ablation    Order Specific Question:   Physician/Practitioner performing the procedure    Answer:   Kalub Morillo A. Dossie Arbour, MD    Order Specific Question:   Indication/Reason    Answer:   Low Back Pain, with our without leg pain, due to Facet Joint Arthralgia (Joint Pain) known as Lumbar Facet Syndrome, secondary to Lumbar, and/or Lumbosacral Spondylosis (Arthritis of  the Spine), without myelopathy or radiculopathy (Nerve Damage).   Provide equipment / supplies at bedside    "Radiofrequency Tray"; Large hemostat (x1); Small hemostat (x1); Towels (x8); 4x4 sterile sponge pack (x1) Needle type: Teflon-coated Radiofrequency Needle (Disposable   single use) Size: Long Quantity: 5    Standing Status:   Standing    Number of Occurrences:   1    Order Specific Question:   Specify    Answer:   Radiofrequency Tray   Latex precautions    Activate Latex-Free Protocol.    Standing Status:   Standing    Number of Occurrences:   1   Chronic Opioid Analgesic:  Oxycodone IR 5 mg, 1 tab PO q 6 hrs (20 mg/day of oxycodone) MME/day: 30 mg/day.   Medications ordered for procedure: Meds ordered this encounter  Medications   lidocaine (XYLOCAINE) 2 % (with pres) injection 400 mg   pentafluoroprop-tetrafluoroeth (GEBAUERS) aerosol   lactated ringers infusion 1,000 mL   midazolam (VERSED) 5 MG/5ML injection 0.5-2 mg    Make sure Flumazenil is available in the pyxis when using this medication. If oversedation occurs, administer 0.2 mg IV over 15 sec. If after 45 sec no response, administer 0.2 mg again over 1 min; may repeat at 1 min intervals; not to exceed 4 doses (1 mg)   fentaNYL (SUBLIMAZE) injection 25-50 mcg    Make sure Narcan is available in the pyxis when using this medication. In the event of respiratory depression (RR< 8/min): Titrate NARCAN (naloxone) in increments of 0.1 to 0.2 mg IV at 2-3 minute intervals, until desired degree of reversal.   ropivacaine (PF) 2 mg/mL (0.2%) (NAROPIN) injection 9 mL   triamcinolone acetonide (KENALOG-40) injection 40 mg   HYDROcodone-acetaminophen (NORCO/VICODIN) 5-325 MG tablet    Sig: Take 1 tablet by mouth every 8 (eight) hours as needed for up to 7 days for severe  pain. Must last 7 days.    Dispense:  21 tablet    Refill:  0    For acute post-operative pain. Not to be refilled. Must last 7 days.    HYDROcodone-acetaminophen (NORCO/VICODIN) 5-325 MG tablet    Sig: Take 1 tablet by mouth every 8 (eight) hours as needed for up to 7 days for severe pain. Must last for 7 days.    Dispense:  21 tablet    Refill:  0    For acute post-operative pain. Not to be refilled. Must last 7 days.   Medications administered: We administered lidocaine, lactated ringers, midazolam, fentaNYL, ropivacaine (PF) 2 mg/mL (0.2%), and triamcinolone acetonide.  See the medical record for exact dosing, route, and time of administration.  Follow-up plan:   Return in about 6 weeks (around 05/03/2021) for (68min), (ECT) procedure: (R) L-FCT RFA #3.       Interventional Therapies  Risk   Complexity Considerations:   Estimated body mass index is 41.2 kg/m as calculated from the following:   Height as of this encounter: 5\' 4"  (1.626 m).   Weight as of this encounter: 240 lb (108.9 kg). WNL   Planned   Pending:   Therapeutic left lumbar facet RFA #3    Under consideration:   Diagnostic right suprascapular nerve block #1  Repeat therapeutic bilateral lumbar facet RFA   Completed:   Palliative bilateral lumbar facet MBB x2 (11/16/2020) (90/90/80/85-90)  Therapeutic left lumbar facet MBB x2 (10/10/2016) (100/100/100/100)  Therapeutic right lumbar facet MBB x1 (08/31/2015) (n/a)  Therapeutic right lumbar facet RFA x2 (05/13/2019) (70/70/60/>50)  Therapeutic left lumbar facet RFA x2 (04/08/2019) 100/100/90)  Palliative left C7-T1 CESI x1 (04/13/2015) (n/a)    Therapeutic   Palliative (PRN) options:   Palliative bilateral lumbar facet MBB  Palliative right lumbar facet RFA  Palliative left lumbar facet RFA  Palliative left C7-T1 CESI  PRN SCS programming adjustments (2018 replacement by Dr. Clydell Hakim)     Recent Visits Date Type Provider Dept  02/26/21 Office Visit Milinda Pointer, MD [facility]  12/28/20 Office Visit Milinda Pointer, MD [facility]  Showing recent visits within past  90 days and meeting all other requirements Today's Visits Date Type Provider Dept  03/22/21 Procedure visit Milinda Pointer, MD [facility]  Showing today's visits and meeting all other requirements Future Appointments Date Type Provider Dept  05/21/21 Appointment Milinda Pointer, MD [facility]  Showing future appointments within next 90 days and meeting all other requirements  Disposition: Discharge home  Discharge (Date   Time): 03/22/2021; 1050 hrs.   Primary Care Physician: Samuel Bouche, NP Location: [facility] Note by: Gaspar Cola, MD Date: 03/22/2021; Time: 11:49 AM  Disclaimer:  Medicine is not an Chief Strategy Officer. The only guarantee in medicine is that nothing is guaranteed. It is important to note that the decision to proceed with this intervention was based on the information collected from the patient. The Data and conclusions were drawn from the patient's questionnaire, the interview, and the physical examination. Because the information was provided in large part by the patient, it cannot be guaranteed that it has not been purposely or unconsciously manipulated. Every effort has been made to obtain as much relevant data as possible for this evaluation. It is important to note that the conclusions that lead to this procedure are derived in large part from the available data. Always take into account that the treatment will also be dependent  on availability of resources and existing treatment guidelines, considered by other Pain Management Practitioners as being common knowledge and practice, at the time of the intervention. For Medico-Legal purposes, it is also important to point out that variation in procedural techniques and pharmacological choices are the acceptable norm. The indications, contraindications, technique, and results of the above procedure should only be interpreted and judged by a Board-Certified Interventional  Pain Specialist with extensive familiarity and expertise in the same exact procedure and technique.

## 2021-03-22 ENCOUNTER — Other Ambulatory Visit: Payer: Self-pay

## 2021-03-22 ENCOUNTER — Ambulatory Visit
Admission: RE | Admit: 2021-03-22 | Discharge: 2021-03-22 | Disposition: A | Discharge status: Home / Self Care | Payer: 59 | Source: Ambulatory Visit | Attending: Pain Medicine | Admitting: Pain Medicine

## 2021-03-22 ENCOUNTER — Encounter: Payer: Self-pay | Admitting: Pain Medicine

## 2021-03-22 ENCOUNTER — Ambulatory Visit (HOSPITAL_BASED_OUTPATIENT_CLINIC_OR_DEPARTMENT_OTHER): Payer: 59 | Admitting: Pain Medicine

## 2021-03-22 VITALS — BP 97/53 | HR 96 | Temp 97.6°F | Resp 15 | Ht 64.0 in | Wt 242.0 lb

## 2021-03-22 DIAGNOSIS — Z9851 Tubal ligation status: Secondary | ICD-10-CM | POA: Insufficient documentation

## 2021-03-22 DIAGNOSIS — Z79899 Other long term (current) drug therapy: Secondary | ICD-10-CM | POA: Diagnosis not present

## 2021-03-22 DIAGNOSIS — M5136 Other intervertebral disc degeneration, lumbar region: Secondary | ICD-10-CM | POA: Diagnosis not present

## 2021-03-22 DIAGNOSIS — G8929 Other chronic pain: Secondary | ICD-10-CM | POA: Insufficient documentation

## 2021-03-22 DIAGNOSIS — Z9049 Acquired absence of other specified parts of digestive tract: Secondary | ICD-10-CM | POA: Diagnosis not present

## 2021-03-22 DIAGNOSIS — Z9682 Presence of neurostimulator: Secondary | ICD-10-CM | POA: Insufficient documentation

## 2021-03-22 DIAGNOSIS — M47816 Spondylosis without myelopathy or radiculopathy, lumbar region: Secondary | ICD-10-CM

## 2021-03-22 DIAGNOSIS — Z79891 Long term (current) use of opiate analgesic: Secondary | ICD-10-CM | POA: Diagnosis not present

## 2021-03-22 DIAGNOSIS — M47817 Spondylosis without myelopathy or radiculopathy, lumbosacral region: Secondary | ICD-10-CM | POA: Insufficient documentation

## 2021-03-22 DIAGNOSIS — Z91040 Latex allergy status: Secondary | ICD-10-CM

## 2021-03-22 DIAGNOSIS — M545 Low back pain, unspecified: Secondary | ICD-10-CM

## 2021-03-22 DIAGNOSIS — G8918 Other acute postprocedural pain: Secondary | ICD-10-CM

## 2021-03-22 DIAGNOSIS — Z6841 Body Mass Index (BMI) 40.0 and over, adult: Secondary | ICD-10-CM | POA: Insufficient documentation

## 2021-03-22 DIAGNOSIS — M51369 Other intervertebral disc degeneration, lumbar region without mention of lumbar back pain or lower extremity pain: Secondary | ICD-10-CM

## 2021-03-22 MED ORDER — TRIAMCINOLONE ACETONIDE 40 MG/ML IJ SUSP
INTRAMUSCULAR | Status: AC
  Filled 2021-03-22: qty 1

## 2021-03-22 MED ORDER — PENTAFLUOROPROP-TETRAFLUOROETH EX AERO
INHALATION_SPRAY | Freq: Once | CUTANEOUS | Status: DC
  Filled 2021-03-22: qty 116

## 2021-03-22 MED ORDER — HYDROCODONE-ACETAMINOPHEN 5-325 MG PO TABS: 1.0000 | ORAL_TABLET | Freq: Three times a day (TID) | ORAL | 0 refills | Status: AC | PRN

## 2021-03-22 MED ORDER — LACTATED RINGERS IV SOLN
1000.0000 mL | Freq: Once | INTRAVENOUS | Status: AC
  Administered 2021-03-22: 1000 mL via INTRAVENOUS

## 2021-03-22 MED ORDER — TRIAMCINOLONE ACETONIDE 40 MG/ML IJ SUSP
40.0000 mg | Freq: Once | INTRAMUSCULAR | Status: AC
  Administered 2021-03-22: 40 mg

## 2021-03-22 MED ORDER — ROPIVACAINE HCL 2 MG/ML IJ SOLN
9.0000 mL | Freq: Once | INTRAMUSCULAR | Status: AC
  Administered 2021-03-22: 9 mL via PERINEURAL

## 2021-03-22 MED ORDER — MIDAZOLAM HCL 5 MG/5ML IJ SOLN
0.5000 mg | Freq: Once | INTRAMUSCULAR | Status: AC
  Administered 2021-03-22: 1.5 mg via INTRAVENOUS

## 2021-03-22 MED ORDER — LIDOCAINE HCL 2 % IJ SOLN
20.0000 mL | Freq: Once | INTRAMUSCULAR | Status: AC
  Administered 2021-03-22: 400 mg

## 2021-03-22 MED ORDER — MIDAZOLAM HCL 5 MG/5ML IJ SOLN
INTRAMUSCULAR | Status: AC
  Filled 2021-03-22: qty 5

## 2021-03-22 MED ORDER — FENTANYL CITRATE (PF) 100 MCG/2ML IJ SOLN
INTRAMUSCULAR | Status: AC
  Filled 2021-03-22: qty 2

## 2021-03-22 MED ORDER — FENTANYL CITRATE (PF) 100 MCG/2ML IJ SOLN
25.0000 ug | INTRAMUSCULAR | Status: DC | PRN
  Administered 2021-03-22: 50 ug via INTRAVENOUS

## 2021-03-22 MED ORDER — LIDOCAINE HCL (PF) 2 % IJ SOLN
INTRAMUSCULAR | Status: AC
  Filled 2021-03-22: qty 15

## 2021-03-22 MED ORDER — LIDOCAINE HCL (PF) 2 % IJ SOLN
INTRAMUSCULAR | Status: AC
  Filled 2021-03-22: qty 5

## 2021-03-22 MED ORDER — ROPIVACAINE HCL 2 MG/ML IJ SOLN
INTRAMUSCULAR | Status: AC
  Filled 2021-03-22: qty 20

## 2021-03-22 NOTE — Patient Instructions (Signed)
___________________________________________________________________________________________  Post-Radiofrequency (RF) Discharge Instructions  You have just completed a Radiofrequency Neurotomy.  The following instructions will provide you with information and guidelines for self-care upon discharge.  If at any time you have questions or concerns please call your physician. DO NOT DRIVE YOURSELF!!  Instructions: Apply ice: Fill a plastic sandwich bag with crushed ice. Cover it with a small towel and apply to injection site. Apply for 15 minutes then remove x 15 minutes. Repeat sequence on day of procedure, until you go to bed. The purpose is to minimize swelling and discomfort after procedure. Apply heat: Apply heat to procedure site starting the day following the procedure. The purpose is to treat any soreness and discomfort from the procedure. Food intake: No eating limitations, unless stipulated above.  Nevertheless, if you have had sedation, you may experience some nausea.  In this case, it may be wise to wait at least two hours prior to resuming regular diet. Physical activities: Keep activities to a minimum for the first 8 hours after the procedure. For the first 24 hours after the procedure, do not drive a motor vehicle,  Operate heavy machinery, power tools, or handle any weapons.  Consider walking with the use of an assistive device or accompanied by an adult for the first 24 hours.  Do not drink alcoholic beverages including beer.  Do not make any important decisions or sign any legal documents. Go home and rest today.  Resume activities tomorrow, as tolerated.  Use caution in moving about as you may experience mild leg weakness.  Use caution in cooking, use of household electrical appliances and climbing steps. Driving: If you have received any sedation, you are not allowed to drive for 24 hours after your procedure. Blood thinner: Restart your blood thinner 6 hours after your procedure. (Only  for those taking blood thinners) Insulin: As soon as you can eat, you may resume your normal dosing schedule. (Only for those taking insulin) Medications: May resume pre-procedure medications.  Do not take any drugs, other than what has been prescribed to you. Infection prevention: Keep procedure site clean and dry. Post-procedure Pain Diary: Extremely important that this be done correctly and accurately. Recorded information will be used to determine the next step in treatment. Pain evaluated is that of treated area only. Do not include pain from an untreated area. Complete every hour, on the hour, for the initial 8 hours. Set an alarm to help you do this part accurately. Do not go to sleep and have it completed later. It will not be accurate. Follow-up appointment: Keep your follow-up appointment after the procedure. Usually 2-6 weeks after radiofrequency. Bring you pain diary. The information collected will be essential for your long-term care.   Expect: From numbing medicine (AKA: Local Anesthetics): Numbness or decrease in pain. Onset: Full effect within 15 minutes of injected. Duration: It will depend on the type of local anesthetic used. On the average, 1 to 8 hours.  From steroids (when added): Decrease in swelling or inflammation. Once inflammation is improved, relief of the pain will follow. Onset of benefits: Depends on the amount of swelling present. The more swelling, the longer it will take for the benefits to be seen. In some cases, up to 10 days. Duration: Steroids will stay in the system x 2 weeks. Duration of benefits will depend on multiple posibilities including persistent irritating factors. From procedure: Some discomfort is to be expected once the numbing medicine wears off. In the case of radiofrequency procedures,  this may last as long as 6 weeks. Additional post-procedure pain medication is provided for this. Discomfort is minimized if ice and heat are applied as  instructed.  Call if: You experience numbness and weakness that gets worse with time, as opposed to wearing off. He experience any unusual bleeding, difficulty breathing, or loss of the ability to control your bowel and bladder. (This applies to Spinal procedures only) You experience any redness, swelling, heat, red streaks, elevated temperature, fever, or any other signs of a possible infection.  Emergency Numbers: [place] hours (Monday - Thursday, 8:00 AM - 4:00 PM) (Friday, 9:00 AM - 12:00 Noon): [phone] After hours: [phone] ____________________________________________________________________________________________ ______________________________________________________________________  Preparing for Procedure with Sedation  NOTICE: Due to recent regulatory changes, starting on September 11, 2020, procedures requiring intravenous (IV) sedation will no longer be performed at the [place].  These types of procedures are required to be performed at [facility] ambulatory surgery facility.  We are very sorry for the inconvenience.  Procedure appointments are limited to planned procedures: No Prescription Refills. No disability issues will be discussed. No medication changes will be discussed.  Instructions: Oral Intake: Do not eat or drink anything for at least 8 hours prior to your procedure. (Exception: Blood Pressure Medication. See below.) Transportation: A driver is required. You may not drive yourself after the procedure. Blood Pressure Medicine: Do not forget to take your blood pressure medicine with a sip of water the morning of the procedure. If your Diastolic (lower reading) is above 100 mmHg, elective cases will be cancelled/rescheduled. Blood thinners: These will need to be stopped for procedures. Notify our staff if you are taking any blood thinners. Depending on which one you take, there will be specific instructions on how and when to stop  it. Diabetics on insulin: Notify the staff so that you can be scheduled 1st case in the morning. If your diabetes requires high dose insulin, take only  of your normal insulin dose the morning of the procedure and notify the staff that you have done so. Preventing infections: Shower with an antibacterial soap the morning of your procedure. Build-up your immune system: Take 1000 mg of Vitamin C with every meal (3 times a day) the day prior to your procedure. Antibiotics: Inform the staff if you have a condition or reason that requires you to take antibiotics before dental procedures. Pregnancy: If you are pregnant, call and cancel the procedure. Sickness: If you have a cold, fever, or any active infections, call and cancel the procedure. Arrival: You must be in the facility at least 30 minutes prior to your scheduled procedure. Children: Do not bring children with you. Dress appropriately: Bring dark clothing that you would not mind if they get stained. Valuables: Do not bring any jewelry or valuables.  Reasons to call and reschedule or cancel your procedure: (Following these recommendations will minimize the risk of a serious complication.) Surgeries: Avoid having procedures within 2 weeks of any surgery. (Avoid for 2 weeks before or after any surgery). Flu Shots: Avoid having procedures within 2 weeks of a flu shots. (Avoid for 2 weeks before or after immunizations). Barium: Avoid having a procedure within 7-10 days after having had a radiological study involving the use of radiological contrast. (Myelograms, Barium swallow or enema study). Heart attacks: Avoid any elective procedures or surgeries for the initial 6 months after a "Myocardial Infarction" (Heart Attack). Blood thinners: It is imperative that you stop these medications before procedures. Let us  know if you if you take any blood thinner.  Infection: Avoid procedures during or within two weeks of an infection (including chest colds or  gastrointestinal problems). Symptoms associated with infections include: Localized redness, fever, chills, night sweats or profuse sweating, burning sensation when voiding, cough, congestion, stuffiness, runny nose, sore throat, diarrhea, nausea, vomiting, cold or Flu symptoms, recent or current infections. It is specially important if the infection is over the area that we intend to treat. Heart and lung problems: Symptoms that may suggest an active cardiopulmonary problem include: cough, chest pain, breathing difficulties or shortness of breath, dizziness, ankle swelling, uncontrolled high or unusually low blood pressure, and/or palpitations. If you are experiencing any of these symptoms, cancel your procedure and contact your primary care physician for an evaluation.  Remember:  Regular Business hours are:  Monday to Thursday 8:00 AM to 4:00 PM  Provider's Schedule: Milinda Pointer, MD:  Procedure days: Tuesday and Thursday 7:30 AM to 4:00 PM  Gillis Santa, MD:  Procedure days: Monday and Wednesday 7:30 AM to 4:00 PM ______________________________________________________________________  ____________________________________________________________________________________________  General Risks and Possible Complications  Patient Responsibilities: It is important that you read this as it is part of your informed consent. It is our duty to inform you of the risks and possible complications associated with treatments offered to you. It is your responsibility as a patient to read this and to ask questions about anything that is not clear or that you believe was not covered in this document.  Patients Rights: You have the right to refuse treatment. You also have the right to change your mind, even after initially having agreed to have the treatment done. However, under this last option, if you wait until the last second to change your mind, you may be charged for the materials used up to that  point.  Introduction: Medicine is not an Chief Strategy Officer. Everything in Medicine, including the lack of treatment(s), carries the potential for danger, harm, or loss (which is by definition: Risk). In Medicine, a complication is a secondary problem, condition, or disease that can aggravate an already existing one. All treatments carry the risk of possible complications. The fact that a side effects or complications occurs, does not imply that the treatment was conducted incorrectly. It must be clearly understood that these can happen even when everything is done following the highest safety standards.  No treatment: You can choose not to proceed with the proposed treatment alternative. The PRO(s) would include: avoiding the risk of complications associated with the therapy. The CON(s) would include: not getting any of the treatment benefits. These benefits fall under one of three categories: diagnostic; therapeutic; and/or palliative. Diagnostic benefits include: getting information which can ultimately lead to improvement of the disease or symptom(s). Therapeutic benefits are those associated with the successful treatment of the disease. Finally, palliative benefits are those related to the decrease of the primary symptoms, without necessarily curing the condition (example: decreasing the pain from a flare-up of a chronic condition, such as incurable terminal cancer).  General Risks and Complications: These are associated to most interventional treatments. They can occur alone, or in combination. They fall under one of the following six (6) categories: no benefit or worsening of symptoms; bleeding; infection; nerve damage; allergic reactions; and/or death. No benefits or worsening of symptoms: In Medicine there are no guarantees, only probabilities. No healthcare provider can ever guarantee that a medical treatment will work, they can only state the probability that it may. Furthermore, there  is always the  possibility that the condition may worsen, either directly, or indirectly, as a consequence of the treatment. Bleeding: This is more common if the patient is taking a blood thinner, either prescription or over the counter (example: Goody Powders, Fish oil, Aspirin, Garlic, etc.), or if suffering a condition associated with impaired coagulation (example: Hemophilia, cirrhosis of the liver, low platelet counts, etc.). However, even if you do not have one on these, it can still happen. If you have any of these conditions, or take one of these drugs, make sure to notify your treating physician. Infection: This is more common in patients with a compromised immune system, either due to disease (example: diabetes, cancer, human immunodeficiency virus [HIV], etc.), or due to medications or treatments (example: therapies used to treat cancer and rheumatological diseases). However, even if you do not have one on these, it can still happen. If you have any of these conditions, or take one of these drugs, make sure to notify your treating physician. Nerve Damage: This is more common when the treatment is an invasive one, but it can also happen with the use of medications, such as those used in the treatment of cancer. The damage can occur to small secondary nerves, or to large primary ones, such as those in the spinal cord and brain. This damage may be temporary or permanent and it may lead to impairments that can range from temporary numbness to permanent paralysis and/or brain death. Allergic Reactions: Any time a substance or material comes in contact with our body, there is the possibility of an allergic reaction. These can range from a mild skin rash (contact dermatitis) to a severe systemic reaction (anaphylactic reaction), which can result in death. Death: In general, any medical intervention can result in death, most of the time due to an unforeseen  complication. ____________________________________________________________________________________________

## 2021-03-22 NOTE — Progress Notes (Signed)
Safety precautions to be maintained throughout the outpatient stay will include: orient to surroundings, keep bed in low position, maintain call bell within reach at all times, provide assistance with transfer out of bed and ambulation.  

## 2021-03-23 ENCOUNTER — Telehealth: Payer: Self-pay

## 2021-03-23 NOTE — Telephone Encounter (Signed)
Post procedure phone call.  LM 

## 2021-05-08 ENCOUNTER — Encounter: Payer: Self-pay | Admitting: Pain Medicine

## 2021-05-08 ENCOUNTER — Ambulatory Visit
Admission: RE | Admit: 2021-05-08 | Discharge: 2021-05-08 | Disposition: A | Discharge status: Home / Self Care | Payer: 59 | Source: Ambulatory Visit | Attending: Pain Medicine | Admitting: Pain Medicine

## 2021-05-08 ENCOUNTER — Ambulatory Visit (HOSPITAL_BASED_OUTPATIENT_CLINIC_OR_DEPARTMENT_OTHER): Payer: 59 | Admitting: Pain Medicine

## 2021-05-08 ENCOUNTER — Other Ambulatory Visit: Payer: Self-pay

## 2021-05-08 VITALS — BP 112/86 | HR 94 | Temp 97.2°F | Resp 14 | Ht 65.0 in | Wt 244.0 lb

## 2021-05-08 DIAGNOSIS — G8929 Other chronic pain: Secondary | ICD-10-CM | POA: Insufficient documentation

## 2021-05-08 DIAGNOSIS — M5136 Other intervertebral disc degeneration, lumbar region: Secondary | ICD-10-CM

## 2021-05-08 DIAGNOSIS — M545 Low back pain, unspecified: Secondary | ICD-10-CM | POA: Insufficient documentation

## 2021-05-08 DIAGNOSIS — Z969 Presence of functional implant, unspecified: Secondary | ICD-10-CM

## 2021-05-08 DIAGNOSIS — M47816 Spondylosis without myelopathy or radiculopathy, lumbar region: Secondary | ICD-10-CM

## 2021-05-08 DIAGNOSIS — M51369 Other intervertebral disc degeneration, lumbar region without mention of lumbar back pain or lower extremity pain: Secondary | ICD-10-CM

## 2021-05-08 DIAGNOSIS — Z6841 Body Mass Index (BMI) 40.0 and over, adult: Secondary | ICD-10-CM | POA: Diagnosis not present

## 2021-05-08 DIAGNOSIS — Z91040 Latex allergy status: Secondary | ICD-10-CM

## 2021-05-08 DIAGNOSIS — M47817 Spondylosis without myelopathy or radiculopathy, lumbosacral region: Secondary | ICD-10-CM

## 2021-05-08 DIAGNOSIS — G8918 Other acute postprocedural pain: Secondary | ICD-10-CM

## 2021-05-08 MED ORDER — MIDAZOLAM HCL 5 MG/5ML IJ SOLN
INTRAMUSCULAR | Status: AC
  Filled 2021-05-08: qty 5

## 2021-05-08 MED ORDER — ROPIVACAINE HCL 2 MG/ML IJ SOLN
INTRAMUSCULAR | Status: AC
  Filled 2021-05-08: qty 20

## 2021-05-08 MED ORDER — LACTATED RINGERS IV SOLN
1000.0000 mL | Freq: Once | INTRAVENOUS | Status: AC
  Administered 2021-05-08: 1000 mL via INTRAVENOUS

## 2021-05-08 MED ORDER — LIDOCAINE HCL (PF) 2 % IJ SOLN
INTRAMUSCULAR | Status: AC
  Filled 2021-05-08: qty 10

## 2021-05-08 MED ORDER — HYDROCODONE-ACETAMINOPHEN 5-325 MG PO TABS: 1.0000 | ORAL_TABLET | Freq: Four times a day (QID) | ORAL | 0 refills | Status: DC | PRN

## 2021-05-08 MED ORDER — LIDOCAINE HCL (PF) 2 % IJ SOLN
INTRAMUSCULAR | Status: AC
  Filled 2021-05-08: qty 5

## 2021-05-08 MED ORDER — HYDROCODONE-ACETAMINOPHEN 5-325 MG PO TABS: 1.0000 | ORAL_TABLET | Freq: Four times a day (QID) | ORAL | 0 refills | Status: AC | PRN

## 2021-05-08 MED ORDER — MIDAZOLAM HCL 5 MG/5ML IJ SOLN
0.5000 mg | Freq: Once | INTRAMUSCULAR | Status: AC
  Administered 2021-05-08: 4 mg via INTRAVENOUS

## 2021-05-08 MED ORDER — FENTANYL CITRATE (PF) 100 MCG/2ML IJ SOLN
INTRAMUSCULAR | Status: AC
  Filled 2021-05-08: qty 2

## 2021-05-08 MED ORDER — ROPIVACAINE HCL 2 MG/ML IJ SOLN
9.0000 mL | Freq: Once | INTRAMUSCULAR | Status: AC
  Administered 2021-05-08: 9 mL via PERINEURAL

## 2021-05-08 MED ORDER — TRIAMCINOLONE ACETONIDE 40 MG/ML IJ SUSP
INTRAMUSCULAR | Status: AC
  Filled 2021-05-08: qty 1

## 2021-05-08 MED ORDER — FENTANYL CITRATE (PF) 100 MCG/2ML IJ SOLN
25.0000 ug | INTRAMUSCULAR | Status: DC | PRN
  Administered 2021-05-08: 100 ug via INTRAVENOUS

## 2021-05-08 MED ORDER — TRIAMCINOLONE ACETONIDE 40 MG/ML IJ SUSP
40.0000 mg | Freq: Once | INTRAMUSCULAR | Status: AC
  Administered 2021-05-08: 40 mg

## 2021-05-08 MED ORDER — LIDOCAINE HCL 2 % IJ SOLN
20.0000 mL | Freq: Once | INTRAMUSCULAR | Status: AC
  Administered 2021-05-08: 100 mg

## 2021-05-08 NOTE — Progress Notes (Signed)
PROVIDER NOTE: Interpretation of information contained herein should be left to medically-trained personnel. Specific patient instructions are provided elsewhere under "Patient Instructions" section of medical record. This document was created in part using STT-dictation technology, any transcriptional errors that may result from this process are unintentional.  ?Patient: Mary Cruz ?Type: Established ?DOB: 05-14-1975 ?MRN: [id number] ?PCP: Samuel Bouche, NP  Service: Procedure ?DOS: 05/08/2021 ?Setting: Ambulatory ?Location: Ambulatory outpatient facility ?Delivery: Face-to-face Provider: Gaspar Cola, MD ?Specialty: Interventional Pain Management ?Specialty designation: 09 ?Location: Outpatient facility ?Ref. Prov.: Milinda Pointer, MD   ? ?Primary Reason for Visit: Interventional Pain Management Treatment. ?CC: Back Pain (lower) ? ?Procedure:          ? Type: Lumbar Facet, Medial Branch Radiofrequency Ablation (RFA) #3  ?Laterality: Right  ?Level: L2, L3, L4, L5, & S1 Medial Branch Level(s). These levels will denervate the L3-4 and L5-S1 lumbar facet joints.  ?Imaging: Fluoroscopic guidance ?Anesthesia: Local anesthesia (1-2% Lidocaine) ?Anxiolysis: IV Versed         ?Sedation: Moderate conscious sedation. ?DOS: 05/08/2021  ?Performed by: Gaspar Cola, MD ? ?Purpose: Therapeutic/Palliative ?Indications: Low back pain severe enough to impact quality of life or function. ?Indications: ?1. Lumbar facet syndrome (Bilateral) (R>L)   ?2. Spondylosis without myelopathy or radiculopathy, lumbosacral region   ?3. Chronic low back pain (Bilateral) (R>L) w/o sciatica   ?4. Lumbar facet arthropathy   ?5. DDD (degenerative disc disease), lumbar   ? Presence of functional implant ([employer]onic Lumbar spinal cord stimulator implant)   ? Latex precautions, history of latex allergy   ? Morbid obesity with BMI of 40.0-44.9, adult ([place])   ? ?[name] has been dealing with the above chronic pain for longer than  three months and has either failed to respond, was unable to tolerate, or simply did not get enough benefit from other more conservative therapies including, but not limited to: ?1. Over-the-counter medications ?2. Anti-inflammatory medications ?3. Muscle relaxants ?4. Membrane stabilizers ?5. Opioids ?6. Physical therapy and/or chiropractic manipulation ?7. Modalities (Heat, ice, etc.) ?8. Invasive techniques such as nerve blocks. ?[name] has attained more than 50% relief of the pain from a series of diagnostic injections conducted in separate occasions. ? ?Pain Score: ?Pre-procedure: 6 /10 ?Post-procedure: 0-No pain/10 ? ?  ? ?Position / Prep / Materials:  ?Position: Prone  ?Prep solution: DuraPrep (Iodine Povacrylex [0.7% available iodine] and Isopropyl Alcohol, 74% w/w) ?Prep Area: Entire Lumbosacral Region (Lower back from mid-thoracic region to end of tailbone and from flank to flank.) ?Materials:  ?Tray: RFA (Radiofrequency) tray ?Needle(s):  ?Type: RFA (Teflon-coated radiofrequency ablation needles) ?Gauge (G): 22  ?Length: Regular (10cm) ?Qty: 5 ? ?Post-procedure evaluation  ?  Type: Lumbar Facet, Medial Branch Radiofrequency Ablation (RFA) #3  ?Laterality: Left  ?Level: L2, L3, L4, L5, & S1 Medial Branch Level(s). These levels will denervate the L3-4 and L5-S1 lumbar facet joints.  ?Imaging: Fluoroscopic guidance ?Anesthesia: Local anesthesia (1-2% Lidocaine) ?Anxiolysis: IV Versed         ?Sedation: Moderate conscious sedation. ?DOS: 03/22/2021  ?Performed by: Gaspar Cola, MD ? ?Purpose: Therapeutic/Palliative ?Indications: Low back pain severe enough to impact quality of life or function. ? ?Pain Score: ?Pre-procedure: 4 /10 ?Post-procedure: 0-No pain/10 ? ?   ?Effectiveness:  ?Initial hour after procedure: 100 %. ?Subsequent 4-6 hours post-procedure: 100 %. ?Analgesia past initial 6 hours: 99 % (current). ?Ongoing improvement:  ?Analgesic: The patient indicates currently enjoying an ongoing  99% relief of the left lower back pain. ?Function:  Ms. Koebel reports improvement in function ?ROM: Ms. Louis reports improvement in ROM ? ?Pre-op H&P Assessment:  ?[name] is a 46 y.o. (year old), female patient, seen today for interventional treatment. She  has a past surgical history that includes spinal cord stimulator; Occipital nerve stimulator insertion; Cholecystectomy (2000); Knee surgery; Shoulder surgery; Tubal ligation (2000); Endometrial ablation; Spinal cord stimulator implant (2008); Knee surgery (Right, 02/2015); Breast surgery; Spinal cord stimulator insertion (N/A, 06/21/2016); Shoulder arthroscopy with distal clavicle resection (Right, 10/29/2018); Subacromial decompression (Right, 10/29/2018); laparoscopic appendectomy (Right, 01/01/2020); Appendectomy; Ankle surgery; Colonoscopy with esophagogastroduodenoscopy (egd); Colonoscopy (N/A, 02/20/2021); and Esophagogastroduodenoscopy (N/A, 02/20/2021). [name] has a current medication list which includes the following prescription(s): amitriptyline, clonazepam, duloxetine, hydrocodone-acetaminophen, [START ON 05/15/2021] hydrocodone-acetaminophen, hyoscyamine, levocetirizine, lisinopril-hydrochlorothiazide, methocarbamol, omeprazole, oxycodone, probiotic product, rizatriptan, topiramate, oxycodone, and oxycodone, and the following Facility-Administered Medications: fentanyl. Her primarily concern today is the Back Pain (lower) ? ?Initial Vital Signs:  ?Pulse/HCG Rate: 94ECG Heart Rate: 84 ?Temp: (!) 97.1 ?F (36.2 ?C) ?Resp: 16 ?BP: 127/82 ?SpO2: 97 % ? ?BMI: Estimated body mass index is 40.6 kg/m? as calculated from the following: ?  Height as of this encounter: '5\' 5"'$  (1.651 m). ?  Weight as of this encounter: 244 lb (110.7 kg). ? ?Risk Assessment: ?Allergies: Reviewed. She is allergic to celecoxib, doxycycline, gabapentin, latex, nabumetone, pentazocine lactate, seroquel [quetiapine], amoxicillin, bactrim [sulfamethoxazole-trimethoprim],  cephalexin, erythromycin, hydromorphone hcl, and lyrica [pregabalin].  ?Allergy Precautions: None required ?Coagulopathies: Reviewed. None identified.  ?Blood-thinner therapy: None at this time ?Active Infection(s): Reviewed. None identified. Ms. Winger is afebrile ? ?Site Confirmation: Ms. Belmonte was asked to confirm the procedure and laterality before marking the site ?Procedure checklist: Completed ?Consent: Before the procedure and under the influence of no sedative(s), amnesic(s), or anxiolytics, the patient was informed of the treatment options, risks and possible complications. To fulfill our ethical and legal obligations, as recommended by the American Medical Association's Code of Ethics, I have informed the patient of my clinical impression; the nature and purpose of the treatment or procedure; the risks, benefits, and possible complications of the intervention; the alternatives, including doing nothing; the risk(s) and benefit(s) of the alternative treatment(s) or procedure(s); and the risk(s) and benefit(s) of doing nothing. ?The patient was provided information about the general risks and possible complications associated with the procedure. These may include, but are not limited to: failure to achieve desired goals, infection, bleeding, organ or nerve damage, allergic reactions, paralysis, and death. ?In addition, the patient was informed of those risks and complications associated to Spine-related procedures, such as failure to decrease pain; infection (i.e.: Meningitis, epidural or intraspinal abscess); bleeding (i.e.: epidural hematoma, subarachnoid hemorrhage, or any other type of intraspinal or peri-dural bleeding); organ or nerve damage (i.e.: Any type of peripheral nerve, nerve root, or spinal cord injury) with subsequent damage to sensory, motor, and/or autonomic systems, resulting in permanent pain, numbness, and/or weakness of one or several areas of the body; allergic reactions; (i.e.:  anaphylactic reaction); and/or death. ?Furthermore, the patient was informed of those risks and complications associated with the medications. These include, but are not limited to: allergic reactions (i.e.: a

## 2021-05-08 NOTE — Progress Notes (Signed)
Safety precautions to be maintained throughout the outpatient stay will include: orient to surroundings, keep bed in low position, maintain call bell within reach at all times, provide assistance with transfer out of bed and ambulation.  

## 2021-05-08 NOTE — Patient Instructions (Signed)
___________________________________________________________________________________________ ? ?Post-Radiofrequency (RF) Discharge Instructions ? ?You have just completed a Radiofrequency Neurotomy.  The following instructions will provide you with information and guidelines for self-care upon discharge.  If at any time you have questions or concerns please call your physician. ?DO NOT DRIVE YOURSELF!! ? ?Instructions: ?Apply ice: Fill a plastic sandwich bag with crushed ice. Cover it with a small towel and apply to injection site. Apply for 15 minutes then remove x 15 minutes. Repeat sequence on day of procedure, until you go to bed. The purpose is to minimize swelling and discomfort after procedure. ?Apply heat: Apply heat to procedure site starting the day following the procedure. The purpose is to treat any soreness and discomfort from the procedure. ?Food intake: No eating limitations, unless stipulated above.  Nevertheless, if you have had sedation, you may experience some nausea.  In this case, it may be wise to wait at least two hours prior to resuming regular diet. ?Physical activities: Keep activities to a minimum for the first 8 hours after the procedure. For the first 24 hours after the procedure, do not drive a motor vehicle,  Operate heavy machinery, power tools, or handle any weapons.  Consider walking with the use of an assistive device or accompanied by an adult for the first 24 hours.  Do not drink alcoholic beverages including beer.  Do not make any important decisions or sign any legal documents. Go home and rest today.  Resume activities tomorrow, as tolerated.  Use caution in moving about as you may experience mild leg weakness.  Use caution in cooking, use of household electrical appliances and climbing steps. ?Driving: If you have received any sedation, you are not allowed to drive for 24 hours after your procedure. ?Blood thinner: Restart your blood thinner 6 hours after your procedure. (Only  for those taking blood thinners) ?Insulin: As soon as you can eat, you may resume your normal dosing schedule. (Only for those taking insulin) ?Medications: May resume pre-procedure medications.  Do not take any drugs, other than what has been prescribed to you. ?Infection prevention: Keep procedure site clean and dry. ?Post-procedure Pain Diary: Extremely important that this be done correctly and accurately. Recorded information will be used to determine the next step in treatment. ?Pain evaluated is that of treated area only. Do not include pain from an untreated area. ?Complete every hour, on the hour, for the initial 8 hours. Set an alarm to help you do this part accurately. ?Do not go to sleep and have it completed later. It will not be accurate. ?Follow-up appointment: Keep your follow-up appointment after the procedure. Usually 2-6 weeks after radiofrequency. Bring you pain diary. The information collected will be essential for your long-term care.  ? ?Expect: ?From numbing medicine (AKA: Local Anesthetics): Numbness or decrease in pain. ?Onset: Full effect within 15 minutes of injected. ?Duration: It will depend on the type of local anesthetic used. On the average, 1 to 8 hours.  ?From steroids (when added): Decrease in swelling or inflammation. Once inflammation is improved, relief of the pain will follow. ?Onset of benefits: Depends on the amount of swelling present. The more swelling, the longer it will take for the benefits to be seen. In some cases, up to 10 days. ?Duration: Steroids will stay in the system x 2 weeks. Duration of benefits will depend on multiple posibilities including persistent irritating factors. ?From procedure: Some discomfort is to be expected once the numbing medicine wears off. In the case of radiofrequency procedures,  this may last as long as 6 weeks. Additional post-procedure pain medication is provided for this. Discomfort is minimized if ice and heat are applied as  instructed. ? ?Call if: ?You experience numbness and weakness that gets worse with time, as opposed to wearing off. ?He experience any unusual bleeding, difficulty breathing, or loss of the ability to control your bowel and bladder. (This applies to Spinal procedures only) ?You experience any redness, swelling, heat, red streaks, elevated temperature, fever, or any other signs of a possible infection. ? ?Emergency Numbers: ?[place] business hours (Monday - Thursday, 8:00 AM - 4:00 PM) (Friday, 9:00 AM - 12:00 Noon): [phone] ?After hours: [phone] ?____________________________________________________________________________________________ ? ____________________________________________________________________________________________ ? ?Virtual Visits  ? ?What is a Manufacturing systems engineer Visit"? ?It is a Metallurgist (medical visit) that takes place on real time (NOT TEXT or E-MAIL) over the telephone or computer device (desktop, laptop, tablet, smart phone, etc.). It allows for more location flexibility between the patient and the healthcare provider. ? ?Who decides when these types of visits will be used? ?The physician. ? ?Who is eligible for these types of visits? ?Only those patients that can be reliably reached over the telephone. ? ?What do you mean by reliably? ?We do not have time to call everyone multiple times, therefore those that tend to screen calls and then call back later are not suitable candidates for this system. We understand how people are reluctant to pickup on "unknown" calls, therefore, we suggest adding our telephone numbers to your list of "CONTACT(s)". This way, you should be able to readily identify our calls when you receive one. All of our numbers are available below.  ? ?Who is not eligible? ?This option is not available for medication management encounters, specially for controlled substances. Patients on pain medications that fall under the category of controlled  substances have to come in for "Face-to-Face" encounters. This is required for mandatory monitoring of these substances. You may be asked to provide a sample for an unannounced urine drug screening test (UDS), and we will need to count your pain pills. Not bringing your pills to be counted may result in no refill. Obviously, neither one of these can be done over the phone. ? ?When will this type of visits be used? ?You can request a virtual visit whenever you are physically unable to attend a regular appointment. The decision will be made by the physician (or healthcare provider) on a case by case basis.  ? ?At what time will I be called? ?This is an excellent question. The providers will try to call you whenever they have time available. Do not expect to be called at any specific time. The secretaries will assign you a time for your virtual visit appointment, but this is done simply to keep a list of those patients that need to be called, but not for the purpose of keeping a time schedule. Be advised that the call may come in anytime during the day, between the hours of 8:00 AM and 8::00 PM, depending on provider availability. We do understand that the system is not perfect. If you are unable to be available that day on a moments notice, then request an "in-person" appointment rather than a "virtual visit". ? ?Can I request my medication visits to be "Virtual"? ?Yes you may request it, but the decision is entirely up to the healthcare provider. Control substances require specific monitoring that requires Face-to-Face encounters. The number of encounters  and the extent of the monitoring  is determined on a case by case basis. ? ?Add a new contact to your smart phone and label it "PAIN CLINIC" ?Under this contact add the following numbers: ?Main: [phone] (Official Contact Number) ?Nurses: [phone] (These are outgoing only calling systems. Do not call this number.) ?Dr. Dossie Arbour: [phone] or [phone] (Outgoing calls only. Do not call this number.) ? ?____________________________________________________________________________________________ ?  ?_______________________________________________________

## 2021-05-09 ENCOUNTER — Telehealth: Payer: Self-pay

## 2021-05-09 NOTE — Telephone Encounter (Signed)
Post procedure follow up phone call.  Patient states she is doing well.  ?

## 2021-05-16 ENCOUNTER — Ambulatory Visit (INDEPENDENT_AMBULATORY_CARE_PROVIDER_SITE_OTHER): Payer: 59 | Admitting: Medical-Surgical

## 2021-05-16 DIAGNOSIS — Z91199 Patient's noncompliance with other medical treatment and regimen due to unspecified reason: Secondary | ICD-10-CM

## 2021-05-16 NOTE — Progress Notes (Signed)
   Complete physical exam  Patient: Mary Cruz   DOB: 12/01/1998   46 y.o. Female  MRN: [id number]  Subjective:    No chief complaint on file.   Mary Cruz is a 46 y.o. female who presents today for a complete physical exam. She reports consuming a {diet types:17450} diet. {types:19826} She generally feels {DESC; WELL/FAIRLY WELL/POORLY:18703}. She reports sleeping {DESC; WELL/FAIRLY WELL/POORLY:18703}. She {does/does not:200015} have additional problems to discuss today.    Most recent fall risk assessment:    08/08/2021   10:42 AM  Fall Risk   Falls in the past year? 0  Number falls in past yr: 0  Injury with Fall? 0  Risk for fall due to : No Fall Risks  Follow up Falls evaluation completed     Most recent depression screenings:    08/08/2021   10:42 AM 06/29/2020   10:46 AM  PHQ 2/9 Scores  PHQ - 2 Score 0 0  PHQ- 9 Score 5     {VISON DENTAL STD PSA (Optional):27386}  {History (Optional):23778}  Patient Care Team: Corrisa Gibby, NP as PCP - General (Nurse Practitioner)   Outpatient Medications Prior to Visit  Medication Sig   fluticasone (FLONASE) 50 MCG/ACT nasal spray Place 2 sprays into both nostrils in the morning and at bedtime. After 7 days, reduce to once daily.   norgestimate-ethinyl estradiol (SPRINTEC 28) 0.25-35 MG-MCG tablet Take 1 tablet by mouth daily.   Nystatin POWD Apply liberally to affected area 2 times per day   spironolactone (ALDACTONE) 100 MG tablet Take 1 tablet (100 mg total) by mouth daily.   No facility-administered medications prior to visit.    ROS        Objective:     There were no vitals taken for this visit. {Vitals History (Optional):23777}  Physical Exam   No results found for any visits on 09/13/21. {Show previous labs (optional):23779}    Assessment & Plan:    Routine Health Maintenance and Physical Exam  Immunization History  Administered Date(s) Administered   DTaP 02/14/1999, 04/12/1999,  06/21/1999, 03/06/2000, 09/20/2003   Hepatitis A 07/17/2007, 07/22/2008   Hepatitis B 12/02/1998, 01/09/1999, 06/21/1999   HiB (PRP-OMP) 02/14/1999, 04/12/1999, 06/21/1999, 03/06/2000   IPV 02/14/1999, 04/12/1999, 12/10/1999, 09/20/2003   Influenza,inj,Quad PF,6+ Mos 10/22/2013   Influenza-Unspecified 01/22/2012   MMR 12/09/2000, 09/20/2003   Meningococcal Polysaccharide 07/22/2011   Pneumococcal Conjugate-13 03/06/2000   Pneumococcal-Unspecified 06/21/1999, 09/04/1999   Tdap 07/22/2011   Varicella 12/10/1999, 07/17/2007    Health Maintenance  Topic Date Due   HIV Screening  Never done   Hepatitis C Screening  Never done   INFLUENZA VACCINE  09/11/2021   PAP-Cervical Cytology Screening  09/13/2021 (Originally 12/01/2019)   PAP SMEAR-Modifier  09/13/2021 (Originally 12/01/2019)   TETANUS/TDAP  09/13/2021 (Originally 07/21/2021)   HPV VACCINES  Discontinued   COVID-19 Vaccine  Discontinued    Discussed health benefits of physical activity, and encouraged her to engage in regular exercise appropriate for her age and condition.  Problem List Items Addressed This Visit   None Visit Diagnoses     Annual physical exam    -  Primary   Cervical cancer screening       Need for Tdap vaccination          No follow-ups on file.     Tayari Yankee, NP   

## 2021-05-21 ENCOUNTER — Ambulatory Visit (HOSPITAL_BASED_OUTPATIENT_CLINIC_OR_DEPARTMENT_OTHER): Payer: 59 | Admitting: Pain Medicine

## 2021-05-21 DIAGNOSIS — Z91199 Patient's noncompliance with other medical treatment and regimen due to unspecified reason: Secondary | ICD-10-CM

## 2021-05-21 NOTE — Progress Notes (Deleted)
Rescheduled

## 2021-05-22 NOTE — Progress Notes (Signed)
This patient cancelled, rescheduled, or was a "NO SHOW" to this appointment. ?

## 2021-06-12 NOTE — Progress Notes (Signed)
PROVIDER NOTE: Information contained herein reflects review and annotations entered in association with encounter. Interpretation of such information and data should be left to medically-trained personnel. Information provided to patient can be located elsewhere in the medical record under "Patient Instructions". Document created using STT-dictation technology, any transcriptional errors that may result from process are unintentional.  ?  ?Patient: Mary Cruz  Service Category: E/M  Provider: Gaspar Cola, MD  ?DOB: 03/24/1975  DOS: 06/13/2021  Specialty: Interventional Pain Management  ?MRN: [id number]  Setting: Ambulatory outpatient  PCP: Samuel Bouche, NP  ?Type: Established Patient    Referring Provider: Samuel Bouche, NP  ?Location: Office  Delivery: Face-to-face    ? ?HPI  ?Ms. Emelia Loron, a 46 y.o. year old female, is here today because of her Chronic pain syndrome [G89.4]. Ms. Strohman primary complain today is Back Pain (lower) ?Last encounter: My last encounter with her was on 05/21/2021. ?Pertinent problems: Ms. 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); Chronic low back pain (Bilateral) (R>L) w/o sciatica; Chronic lumbar radicular pain (Right L5 dermatome; Left S1 Dermatome) (Bilateral) (R>L); Chronic neck pain (1ry area of Pain) (Bilateral) (L>R); Chronic cervical radicular pain (C7 Dermatome) (Bilateral) (L>R); Chronic lower extremity pain (Bilateral) (R>L); Lumbar spondylosis; Lumbar facet syndrome (Bilateral) (R>L); Neurogenic pain; Fibromyalgia; Pain in right knee; Chronic upper extremity pain (2ry area of Pain) (Bilateral) (L>R); Chronic hip pain (3ry area of Pain) (Bilateral) (R>L); Closed fracture of scapula, sequela (Right); DDD (degenerative disc disease), cervical; Cervicalgia; Abnormal MRI, shoulder (Right); Spondylosis without myelopathy or radiculopathy,  lumbosacral region; DDD (degenerative disc disease), lumbar; Osteoarthritis involving multiple joints; Chronic low back pain (Right) w/ sciatica (Right); Chronic lower extremity pain (Right); Lumbosacral radiculopathy at L5 (Right); Dropfoot (Right); Lumbar facet arthropathy; Intractable low back pain; Closed fracture of multiple ribs of right side; Chronic feet pain (Bilateral); Migraines; and Chronic hip pain (Right) on their pertinent problem list. ?Pain Assessment: Severity of Chronic pain is reported as a 6 /10. Location: Back Right, Lower/right hip down side of leg to foot effects all toes. Onset: More than a month ago. Quality: Aching, Constant, Numbness, Tingling, Discomfort, Tender. Timing: Constant. Modifying factor(s): medications, procedures, did PT with no success. ?Vitals:  height is '5\' 4"'$  (1.626 m) and weight is 246 lb (111.6 kg). Her temperature is 97.9 ?F (36.6 ?C). Her blood pressure is 136/88 and her pulse is 93. Her respiration is 16 and oxygen saturation is 98%.  ? ?Reason for encounter: medication management.   The patient indicates doing well with the current medication regimen. No adverse reactions or side effects reported to the medications.  ? ?According to the patient's PMP, out of the 3 prescriptions that I wrote on 10/30/2020, she has only had 2 of those filled.  The prescription written on 11/16/2020 was filled.  Out of the 3 prescriptions written on 02/27/2021 she has only had 1 filled.  This means that she should have 1 prescription from 10/30/2020 and 2 prescriptions from 02/27/2021.  This is for the patient's oxycodone IR which she takes on a regular basis.  She also had 3 of her 4 hydrocodone/APAP prescriptions for postop pain filled.  The last was filled on 05/26/2021.  She has 1 more left on the hydrocodone.  The last oxycodone prescription was filled on 03/22/2021. ? ?Plan: Call pharmacy and cancel the oxycodone IR prescription from 10/30/2020 and the other 2 oxycodone IR  prescriptions from 02/27/2021.  Cancel last hydrocodone prescription from 05/08/2021. ? ?The patient refers having attained a 100% relief of the pain for the duration of the local anesthetic with the radiofrequency ablation of the lumbar facets.  In the case of the left side, she still enjoying 100% relief of the pain on that side while on the right side she refers only having an ongoing 50% relief.  However, she also indicates that this may be secondary to the problem that she is currently experiencing in the right hip. ? ?The patient indicates still having quite a bit of pain in the area of the right hip.  She had a fall from a horse and although she previously had indicated to me that x-rays were done, today I have tried to find those and I could not find a dedicated x-ray at that right hip.  Apparently they might of been taken at her primary care physician's office and they are currently not available.  However, the patient is still having quite a bit of pain in the area of the right hip.  Physical exam today demonstrated the patient to have decreased range of motion and exquisite tenderness to palpation in the lateral aspect of the femoral head (trochanteric bursa area) with a positive Patrick maneuver for right hip arthralgia.  Based on the findings, today I will be ordering x-rays of her right hip and since this has been present since her fall on 05/06/2020, I will be also ordering an MRI of that right hip.  I will also tentatively schedule the patient to return for a right intra-articular hip joint injection under fluoroscopic guidance, after her MRI.  Unfortunately, the patient has not been able to control her weight and currently she has a BMI of 42.23 kg/m? which is negatively contributing to her hip problem. ? ?RTCB: 09/11/2021 (prescriptions printed today since the epic E scribing system is not working) ?Nonopioids transferred 04/06/2020: Robaxin ? ?Post-procedure evaluation  ? Type: Lumbar Facet, Medial  Branch Radiofrequency Ablation (RFA) #3  ?Laterality: Right  ?Level: L2, L3, L4, L5, & S1 Medial Branch Level(s). These levels will denervate the L3-4 and L5-S1 lumbar facet joints.  ?Imaging: Fluoroscopic guidance ?Anesthesia: Local anesthesia (1-2% Lidocaine) ?Anxiolysis: IV Versed         ?Sedation: Moderate conscious sedation. ?DOS: 05/08/2021  ?Performed by: Gaspar Cola, MD ? ?Purpose: Therapeutic/Palliative ?Indications: Low back pain severe enough to impact quality of life or function. ?Indications: ?1. Lumbar facet syndrome (Bilateral) (R>L)   ?2. Spondylosis without myelopathy or radiculopathy, lumbosacral region   ?3. Chronic low back pain (Bilateral) (R>L) w/o sciatica   ?4. Lumbar facet arthropathy   ?5. DDD (degenerative disc disease), lumbar   ? Presence of functional implant (Medtronic Lumbar spinal cord stimulator implant)   ? Latex precautions, history of latex allergy   ? Morbid obesity with BMI of 40.0-44.9, adult ([place])   ? ?Ms. Moncrieffe has been dealing with the above chronic pain for longer than three months and has either failed to respond, was unable to tolerate, or simply did not get enough benefit from other more conservative therapies including, but not limited to: ?1. Over-the-counter medications ?2. Anti-inflammatory medications ?3. Muscle relaxants ?4. Membrane stabilizers ?5. Opioids ?6. Physical therapy and/or chiropractic manipulation ?7. Modalities (Heat, ice, etc.) ?8. Invasive techniques such as nerve blocks. ?Ms. Ebling has attained more than 50% relief of the pain from a series of diagnostic injections conducted in separate occasions. ? ?Pain Score: ?Pre-procedure: 6 /10 ?Post-procedure: 0-No pain/10 ? ?  ?  Effectiveness:  ?Initial hour after procedure: 100 %. ?Subsequent 4-6 hours post-procedure: 100 %. ?Analgesia past initial 6 hours: 50 % (current). ?Ongoing improvement:  ?Analgesic: The patient indicates an ongoing 50% improvement of the right-sided low back  pain. ?Function: Ms. Sambrano reports improvement in function ?ROM: Ms. Roberg reports improvement in ROM ? ?Pharmacotherapy Assessment  ?Analgesic: Oxycodone IR 5 mg, 1 tab PO q 6 hrs (20 mg/day of oxycodone) ?MME/day: 30 mg/day.

## 2021-06-13 ENCOUNTER — Ambulatory Visit: Payer: 59 | Attending: Pain Medicine | Admitting: Pain Medicine

## 2021-06-13 ENCOUNTER — Encounter: Payer: Self-pay | Admitting: Pain Medicine

## 2021-06-13 VITALS — BP 136/88 | HR 93 | Temp 97.9°F | Resp 16 | Ht 64.0 in | Wt 246.0 lb

## 2021-06-13 DIAGNOSIS — M797 Fibromyalgia: Secondary | ICD-10-CM | POA: Diagnosis present

## 2021-06-13 DIAGNOSIS — Z79899 Other long term (current) drug therapy: Secondary | ICD-10-CM

## 2021-06-13 DIAGNOSIS — M542 Cervicalgia: Secondary | ICD-10-CM | POA: Diagnosis not present

## 2021-06-13 DIAGNOSIS — M25552 Pain in left hip: Secondary | ICD-10-CM | POA: Insufficient documentation

## 2021-06-13 DIAGNOSIS — Z969 Presence of functional implant, unspecified: Secondary | ICD-10-CM | POA: Diagnosis present

## 2021-06-13 DIAGNOSIS — M159 Polyosteoarthritis, unspecified: Secondary | ICD-10-CM | POA: Diagnosis present

## 2021-06-13 DIAGNOSIS — M25551 Pain in right hip: Secondary | ICD-10-CM | POA: Diagnosis not present

## 2021-06-13 DIAGNOSIS — M5459 Other low back pain: Secondary | ICD-10-CM

## 2021-06-13 DIAGNOSIS — G894 Chronic pain syndrome: Secondary | ICD-10-CM

## 2021-06-13 DIAGNOSIS — Z79891 Long term (current) use of opiate analgesic: Secondary | ICD-10-CM | POA: Diagnosis present

## 2021-06-13 DIAGNOSIS — M47816 Spondylosis without myelopathy or radiculopathy, lumbar region: Secondary | ICD-10-CM | POA: Diagnosis not present

## 2021-06-13 DIAGNOSIS — G8929 Other chronic pain: Secondary | ICD-10-CM

## 2021-06-13 DIAGNOSIS — M79602 Pain in left arm: Secondary | ICD-10-CM | POA: Diagnosis present

## 2021-06-13 DIAGNOSIS — M79601 Pain in right arm: Secondary | ICD-10-CM | POA: Diagnosis present

## 2021-06-13 DIAGNOSIS — M150 Primary generalized (osteo)arthritis: Secondary | ICD-10-CM

## 2021-06-13 HISTORY — DX: Other chronic pain: G89.29

## 2021-06-13 MED ORDER — OXYCODONE HCL 5 MG PO TABS: 5.0000 mg | ORAL_TABLET | Freq: Four times a day (QID) | ORAL | 0 refills | Status: DC | PRN

## 2021-06-13 NOTE — Progress Notes (Signed)
Nursing Pain Medication Assessment:  ?Safety precautions to be maintained throughout the outpatient stay will include: orient to surroundings, keep bed in low position, maintain call bell within reach at all times, provide assistance with transfer out of bed and ambulation.  ?Medication Inspection Compliance: Pill count conducted under aseptic conditions, in front of the patient. Neither the pills nor the bottle was removed from the patient's sight at any time. Once count was completed pills were immediately returned to the patient in their original bottle. ? ?Medication: See above ?Pill/Patch Count:  118/ of 120 pills remain ?Pill/Patch Appearance: Markings consistent with prescribed medication ?Bottle Appearance: Standard pharmacy container. Clearly labeled. ?Filled Date: 5 / 2 / 2023 ?Last Medication intake:  Today ?

## 2021-06-15 ENCOUNTER — Ambulatory Visit
Admission: RE | Admit: 2021-06-15 | Discharge: 2021-06-15 | Disposition: A | Discharge status: Home / Self Care | Payer: 59 | Attending: Pain Medicine | Admitting: Pain Medicine

## 2021-06-15 ENCOUNTER — Ambulatory Visit
Admission: RE | Admit: 2021-06-15 | Discharge: 2021-06-15 | Disposition: A | Discharge status: Home / Self Care | Payer: 59 | Source: Ambulatory Visit | Attending: Pain Medicine | Admitting: Pain Medicine

## 2021-06-15 DIAGNOSIS — G8929 Other chronic pain: Secondary | ICD-10-CM | POA: Insufficient documentation

## 2021-06-15 DIAGNOSIS — M159 Polyosteoarthritis, unspecified: Secondary | ICD-10-CM | POA: Diagnosis present

## 2021-06-15 DIAGNOSIS — M25551 Pain in right hip: Secondary | ICD-10-CM | POA: Diagnosis not present

## 2021-06-19 ENCOUNTER — Telehealth: Payer: 59 | Admitting: Pain Medicine

## 2021-06-28 ENCOUNTER — Ambulatory Visit
Admission: RE | Admit: 2021-06-28 | Discharge: 2021-06-28 | Disposition: A | Discharge status: Home / Self Care | Payer: 59 | Source: Ambulatory Visit | Attending: Pain Medicine | Admitting: Pain Medicine

## 2021-06-28 DIAGNOSIS — M159 Polyosteoarthritis, unspecified: Secondary | ICD-10-CM | POA: Diagnosis present

## 2021-06-28 DIAGNOSIS — G8929 Other chronic pain: Secondary | ICD-10-CM | POA: Insufficient documentation

## 2021-06-28 DIAGNOSIS — M25551 Pain in right hip: Secondary | ICD-10-CM | POA: Diagnosis present

## 2021-07-16 DIAGNOSIS — M1611 Unilateral primary osteoarthritis, right hip: Secondary | ICD-10-CM

## 2021-07-16 HISTORY — DX: Unilateral primary osteoarthritis, right hip: M16.11

## 2021-07-16 NOTE — Progress Notes (Unsigned)
PROVIDER NOTE: Interpretation of information contained herein should be left to medically-trained personnel. Specific patient instructions are provided elsewhere under "Patient Instructions" section of medical record. This document was created in part using STT-dictation technology, any transcriptional errors that may result from this process are unintentional.  Patient: Mary Cruz Type: Established DOB: 05-29-1975 MRN: [id number] PCP: Samuel Bouche, NP  Service: Procedure DOS: 07/17/2021 Setting: Ambulatory Location: Ambulatory outpatient facility Delivery: Face-to-face Provider: Gaspar Cola, MD Specialty: Interventional Pain Management Specialty designation: 09 Location: Outpatient facility Ref. Prov.: Samuel Bouche, NP    Primary Reason for Visit: Interventional Pain Management Treatment. CC: No chief complaint on file.    Procedure:          Anesthesia, Analgesia, Anxiolysis:  Type: Intra-Articular Hip Injection #1  Primary Purpose: Diagnostic Region: Posterolateral hip joint area. Level: Lower pelvic and hip joint level. Target Area: Superior aspect of the hip joint cavity, going thru the superior portion of the capsular ligament. Approach: Posterolateral approach. Laterality: Right  Anesthesia: Local (1-2% Lidocaine)  Anxiolysis: None  Sedation: None  Guidance: Fluoroscopy           Position: Lateral Decubitus with bad side up Prepped Area: Entire Posterolateral hip area. DuraPrep (Iodine Povacrylex [0.7% available iodine] and Isopropyl Alcohol, 74% w/w)   1. Chronic hip pain (Right)   2. Osteoarthritis involving multiple joints   3. Osteoarthritis of hip (Right)    NAS-11 Pain score:   Pre-procedure:  /10   Post-procedure:  /10     Pre-op H&P Assessment:  Mary Cruz is a 46 y.o. (year old), female patient, seen today for interventional treatment. She  has a past surgical history that includes spinal cord stimulator; Occipital nerve stimulator insertion;  Cholecystectomy (2000); Knee surgery; Shoulder surgery; Tubal ligation (2000); Endometrial ablation; Spinal cord stimulator implant (2008); Knee surgery (Right, 02/2015); Breast surgery; Spinal cord stimulator insertion (N/A, 06/21/2016); Shoulder arthroscopy with distal clavicle resection (Right, 10/29/2018); Subacromial decompression (Right, 10/29/2018); laparoscopic appendectomy (Right, 01/01/2020); Appendectomy; Ankle surgery; Colonoscopy with esophagogastroduodenoscopy (egd); Colonoscopy (N/A, 02/20/2021); and Esophagogastroduodenoscopy (N/A, 02/20/2021). Mary Cruz has a current medication list which includes the following prescription(s): amitriptyline, clonazepam, duloxetine, hyoscyamine, levocetirizine, lisinopril-hydrochlorothiazide, methocarbamol, omeprazole, oxycodone, [START ON 08/11/2021] oxycodone, [START ON 09/10/2021] oxycodone, probiotic product, rizatriptan, and topiramate. Her primarily concern today is the No chief complaint on file.  Initial Vital Signs:  Pulse/HCG Rate:    Temp:   Resp:   BP:   SpO2:    BMI: Estimated body mass index is 42.23 kg/m as calculated from the following:   Height as of 06/13/21: '5\' 4"'$  (1.626 m).   Weight as of 06/13/21: 246 lb (111.6 kg).  Risk Assessment: Allergies: Reviewed. She is allergic to celecoxib, doxycycline, gabapentin, latex, nabumetone, pentazocine lactate, seroquel [quetiapine], amoxicillin, bactrim [sulfamethoxazole-trimethoprim], cephalexin, erythromycin, hydromorphone hcl, and lyrica [pregabalin].  Allergy Precautions: None required Coagulopathies: Reviewed. None identified.  Blood-thinner therapy: None at this time Active Infection(s): Reviewed. None identified. Mary Cruz is afebrile  Site Confirmation: Mary Cruz was asked to confirm the procedure and laterality before marking the site Procedure checklist: Completed Consent: Before the procedure and under the influence of no sedative(s), amnesic(s), or anxiolytics, the patient was  informed of the treatment options, risks and possible complications. To fulfill our ethical and legal obligations, as recommended by the American Medical Association's Code of Ethics, I have informed the patient of my clinical impression; the nature and purpose of the treatment or procedure; the risks, benefits, and possible complications of the intervention;  the alternatives, including doing nothing; the risk(s) and benefit(s) of the alternative treatment(s) or procedure(s); and the risk(s) and benefit(s) of doing nothing. The patient was provided information about the general risks and possible complications associated with the procedure. These may include, but are not limited to: failure to achieve desired goals, infection, bleeding, organ or nerve damage, allergic reactions, paralysis, and death. In addition, the patient was informed of those risks and complications associated to the procedure, such as failure to decrease pain; infection; bleeding; organ or nerve damage with subsequent damage to sensory, motor, and/or autonomic systems, resulting in permanent pain, numbness, and/or weakness of one or several areas of the body; allergic reactions; (i.e.: anaphylactic reaction); and/or death. Furthermore, the patient was informed of those risks and complications associated with the medications. These include, but are not limited to: allergic reactions (i.e.: anaphylactic or anaphylactoid reaction(s)); adrenal axis suppression; blood sugar elevation that in diabetics may result in ketoacidosis or comma; water retention that in patients with history of congestive heart failure may result in shortness of breath, pulmonary edema, and decompensation with resultant heart failure; weight gain; swelling or edema; medication-induced neural toxicity; particulate matter embolism and blood vessel occlusion with resultant organ, and/or nervous system infarction; and/or aseptic necrosis of one or more joints. Finally, the  patient was informed that Medicine is not an exact science; therefore, there is also the possibility of unforeseen or unpredictable risks and/or possible complications that may result in a catastrophic outcome. The patient indicated having understood very clearly. We have given the patient no guarantees and we have made no promises. Enough time was given to the patient to ask questions, all of which were answered to the patient's satisfaction. Ms. Raczynski has indicated that she wanted to continue with the procedure. Attestation: I, the ordering provider, attest that I have discussed with the patient the benefits, risks, side-effects, alternatives, likelihood of achieving goals, and potential problems during recovery for the procedure that I have provided informed consent. Date  Time: {CH[facility]-PAIN TIME CHOICES:[id number]}  Pre-Procedure Preparation:  Monitoring: As per clinic protocol. Respiration, ETCO2, SpO2, BP, heart rate and rhythm monitor placed and checked for adequate function Safety Precautions: Patient was assessed for positional comfort and pressure points before starting the procedure. Time-out: I initiated and conducted the "Time-out" before starting the procedure, as per protocol. The patient was asked to participate by confirming the accuracy of the "Time Out" information. Verification of the correct person, site, and procedure were performed and confirmed by me, the nursing staff, and the patient. "Time-out" conducted as per Joint Commission's Universal Protocol (UP.01.01.01). Time:    Description of Procedure:          Safety Precautions: Aspiration looking for blood return was conducted prior to all injections. At no point did we inject any substances, as a needle was being advanced. No attempts were made at seeking any paresthesias. Safe injection practices and needle disposal techniques used. Medications properly checked for expiration dates. SDV (single dose vial) medications  used. Description of the Procedure: Protocol guidelines were followed. The patient was placed in position over the fluoroscopy table. The target area was identified and the area prepped in the usual manner. Skin & deeper tissues infiltrated with local anesthetic. Appropriate amount of time allowed to pass for local anesthetics to take effect. The procedure needles were then advanced to the target area. Proper needle placement secured. Negative aspiration confirmed. Solution injected in intermittent fashion, asking for systemic symptoms every 0.5cc of injectate. The needles  were then removed and the area cleansed, making sure to leave some of the prepping solution back to take advantage of its long term bactericidal properties. There were no vitals filed for this visit.  Start Time:   hrs. End Time:   hrs. Materials:  Needle(s) Type: Spinal Needle Gauge: 22G Length: 7.0-in Medication(s): Please see orders for medications and dosing details.  Imaging Guidance (Non-Spinal):          Type of Imaging Technique: Fluoroscopy Guidance (Non-Spinal) Indication(s): Assistance in needle guidance and placement for procedures requiring needle placement in or near specific anatomical locations not easily accessible without such assistance. Exposure Time: Please see nurses notes. Contrast: Before injecting any contrast, we confirmed that the patient did not have an allergy to iodine, shellfish, or radiological contrast. Once satisfactory needle placement was completed at the desired level, radiological contrast was injected. Contrast injected under live fluoroscopy. No contrast complications. See chart for type and volume of contrast used. Fluoroscopic Guidance: I was personally present during the use of fluoroscopy. "Tunnel Vision Technique" used to obtain the best possible view of the target area. Parallax error corrected before commencing the procedure. "Direction-depth-direction" technique used to introduce the  needle under continuous pulsed fluoroscopy. Once target was reached, antero-posterior, oblique, and lateral fluoroscopic projection used confirm needle placement in all planes. Images permanently stored in EMR. Interpretation: I personally interpreted the imaging intraoperatively. Adequate needle placement confirmed in multiple planes. Appropriate spread of contrast into desired area was observed. No evidence of afferent or efferent intravascular uptake. Permanent images saved into the patient's record.  Antibiotic Prophylaxis:   Anti-infectives (From admission, onward)    None      Indication(s): None identified  Post-operative Assessment:  Post-procedure Vital Signs:  Pulse/HCG Rate:    Temp:   Resp:   BP:   SpO2:    EBL: None  Complications: No immediate post-treatment complications observed by team, or reported by patient.  Note: The patient tolerated the entire procedure well. A repeat set of vitals were taken after the procedure and the patient was kept under observation following institutional policy, for this type of procedure. Post-procedural neurological assessment was performed, showing return to baseline, prior to discharge. The patient was provided with post-procedure discharge instructions, including a section on how to identify potential problems. Should any problems arise concerning this procedure, the patient was given instructions to immediately contact us, at any time, without hesitation. In any case, we plan to contact the patient by telephone for a follow-up status report regarding this interventional procedure.  Comments:  No additional relevant information.  Plan of Care  Orders:  No orders of the defined types were placed in this encounter.  Chronic Opioid Analgesic:  Oxycodone IR 5 mg, 1 tab PO q 6 hrs (20 mg/day of oxycodone) MME/day: 30 mg/day.   Medications ordered for procedure: No orders of the defined types were placed in this  encounter.  Medications administered: Emelia Loron had no medications administered during this visit.  See the medical record for exact dosing, route, and time of administration.  Follow-up plan:   No follow-ups on file.       Interventional Therapies  Risk  Complexity Considerations:   Estimated body mass index is 41.2 kg/m as calculated from the following:   Height as of this encounter: '5\' 4"'$  (1.626 m).   Weight as of this encounter: 240 lb (108.9 kg). Latex allergy    Planned  Pending:   Diagnostic right hip x-rays and  MRI (06/13/2021) Diagnostic/therapeutic right IA hip joint inj. #1    Under consideration:   Diagnostic/therapeutic right IA hip joint inj. #1  Diagnostic right suprascapular NB #1    Completed:   Palliative bilateral lumbar facet MBB x2 (11/16/2020) (90/90/80/85-90)  Therapeutic left lumbar facet MBB x2 (10/10/2016) (100/100/100/100)  Therapeutic right lumbar facet MBB x1 (08/31/2015) (n/a)  Therapeutic right lumbar facet RFA x3 (05/08/2021) (100/100/50/50) on the right side Therapeutic left lumbar facet RFA x3 (03/22/2021) (100/100/99/99) on left side Palliative left C7-T1 CESI x1 (04/13/2015) (n/a)    Therapeutic  Palliative (PRN) options:   Palliative lumbar facet MBB  Palliative lumbar facet RFA  Palliative C7-T1 CESI  PRN SCS programming adjustments (2018 replacement by Dr. Clydell Hakim)     Recent Visits Date Type Provider Dept  06/13/21 Office Visit Milinda Pointer, MD [facility]  05/08/21 Procedure visit Milinda Pointer, MD [facility]  Showing recent visits within past 90 days and meeting all other requirements Future Appointments Date Type Provider Dept  07/17/21 Appointment Milinda Pointer, [facility]  09/03/21 Appointment Milinda Pointer, MD [facility]  Showing future appointments within next 90 days and meeting all other requirements  Disposition: Discharge home  Discharge  (Date  Time): 07/17/2021;   hrs.   Primary Care Physician: Samuel Bouche, NP Location: [facility] Note by: Gaspar Cola, MD Date: 07/17/2021; Time: 2:22 PM  Disclaimer:  Medicine is not an Chief Strategy Officer. The only guarantee in medicine is that nothing is guaranteed. It is important to note that the decision to proceed with this intervention was based on the information collected from the patient. The Data and conclusions were drawn from the patient's questionnaire, the interview, and the physical examination. Because the information was provided in large part by the patient, it cannot be guaranteed that it has not been purposely or unconsciously manipulated. Every effort has been made to obtain as much relevant data as possible for this evaluation. It is important to note that the conclusions that lead to this procedure are derived in large part from the available data. Always take into account that the treatment will also be dependent on availability of resources and existing treatment guidelines, considered by other Pain Management Practitioners as being common knowledge and practice, at the time of the intervention. For Medico-Legal purposes, it is also important to point out that variation in procedural techniques and pharmacological choices are the acceptable norm. The indications, contraindications, technique, and results of the above procedure should only be interpreted and judged by a Board-Certified Interventional Pain Specialist with extensive familiarity and expertise in the same exact procedure and technique.

## 2021-07-17 ENCOUNTER — Other Ambulatory Visit: Payer: Self-pay

## 2021-07-17 ENCOUNTER — Encounter: Payer: Self-pay | Admitting: Pain Medicine

## 2021-07-17 ENCOUNTER — Ambulatory Visit: Payer: 59 | Attending: Pain Medicine | Admitting: Pain Medicine

## 2021-07-17 ENCOUNTER — Ambulatory Visit
Admission: RE | Admit: 2021-07-17 | Discharge: 2021-07-17 | Disposition: A | Discharge status: Home / Self Care | Payer: 59 | Source: Ambulatory Visit | Attending: Pain Medicine | Admitting: Pain Medicine

## 2021-07-17 VITALS — BP 117/78 | HR 93 | Temp 97.2°F | Resp 16 | Ht 64.0 in | Wt 255.0 lb

## 2021-07-17 DIAGNOSIS — Z6841 Body Mass Index (BMI) 40.0 and over, adult: Secondary | ICD-10-CM | POA: Insufficient documentation

## 2021-07-17 DIAGNOSIS — M25551 Pain in right hip: Secondary | ICD-10-CM | POA: Diagnosis present

## 2021-07-17 DIAGNOSIS — G8929 Other chronic pain: Secondary | ICD-10-CM | POA: Diagnosis not present

## 2021-07-17 DIAGNOSIS — M159 Polyosteoarthritis, unspecified: Secondary | ICD-10-CM

## 2021-07-17 DIAGNOSIS — M1611 Unilateral primary osteoarthritis, right hip: Secondary | ICD-10-CM | POA: Diagnosis not present

## 2021-07-17 MED ORDER — ROPIVACAINE HCL 2 MG/ML IJ SOLN
9.0000 mL | Freq: Once | INTRAMUSCULAR | Status: AC
  Administered 2021-07-17: 9 mL via INTRA_ARTICULAR

## 2021-07-17 MED ORDER — IOHEXOL 180 MG/ML  SOLN
10.0000 mL | Freq: Once | INTRAMUSCULAR | Status: AC
  Administered 2021-07-17: 5 mL via INTRA_ARTICULAR

## 2021-07-17 MED ORDER — MIDAZOLAM HCL 5 MG/5ML IJ SOLN
0.5000 mg | Freq: Once | INTRAMUSCULAR | Status: AC
  Administered 2021-07-17: 2 mg via INTRAVENOUS

## 2021-07-17 MED ORDER — PENTAFLUOROPROP-TETRAFLUOROETH EX AERO
INHALATION_SPRAY | Freq: Once | CUTANEOUS | Status: AC
  Administered 2021-07-17: 30 via TOPICAL

## 2021-07-17 MED ORDER — METHYLPREDNISOLONE ACETATE 80 MG/ML IJ SUSP
80.0000 mg | Freq: Once | INTRAMUSCULAR | Status: AC
  Administered 2021-07-17: 80 mg via INTRA_ARTICULAR

## 2021-07-17 MED ORDER — LACTATED RINGERS IV SOLN
1000.0000 mL | Freq: Once | INTRAVENOUS | Status: AC
  Administered 2021-07-17: 1000 mL via INTRAVENOUS

## 2021-07-17 MED ORDER — LIDOCAINE HCL 2 % IJ SOLN
20.0000 mL | Freq: Once | INTRAMUSCULAR | Status: AC
  Administered 2021-07-17: 400 mg

## 2021-07-17 NOTE — Progress Notes (Signed)
Safety precautions to be maintained throughout the outpatient stay will include: orient to surroundings, keep bed in low position, maintain call bell within reach at all times, provide assistance with transfer out of bed and ambulation.  

## 2021-07-17 NOTE — Patient Instructions (Signed)
____________________________________________________________________________________________  Post-Procedure Discharge Instructions  Instructions: Apply ice:  Purpose: This will minimize any swelling and discomfort after procedure.  When: Day of procedure, as soon as you get home. How: Fill a plastic sandwich bag with crushed ice. Cover it with a small towel and apply to injection site. How long: (15 min on, 15 min off) Apply for 15 minutes then remove x 15 minutes.  Repeat sequence on day of procedure, until you go to bed. Apply heat:  Purpose: To treat any soreness and discomfort from the procedure. When: Starting the next day after the procedure. How: Apply heat to procedure site starting the day following the procedure. How long: May continue to repeat daily, until discomfort goes away. Food intake: Start with clear liquids (like water) and advance to regular food, as tolerated.  Physical activities: Keep activities to a minimum for the first 8 hours after the procedure. After that, then as tolerated. Driving: If you have received any sedation, be responsible and do not drive. You are not allowed to drive for 24 hours after having sedation. Blood thinner: (Applies only to those taking blood thinners) You may restart your blood thinner 6 hours after your procedure. Insulin: (Applies only to Diabetic patients taking insulin) As soon as you can eat, you may resume your normal dosing schedule. Infection prevention: Keep procedure site clean and dry. Shower daily and clean area with soap and water. Post-procedure Pain Diary: Extremely important that this be done correctly and accurately. Recorded information will be used to determine the next step in treatment. For the purpose of accuracy, follow these rules: Evaluate only the area treated. Do not report or include pain from an untreated area. For the purpose of this evaluation, ignore all other areas of pain, except for the treated area. After  your procedure, avoid taking a long nap and attempting to complete the pain diary after you wake up. Instead, set your alarm clock to go off every hour, on the hour, for the initial 8 hours after the procedure. Document the duration of the numbing medicine, and the relief you are getting from it. Do not go to sleep and attempt to complete it later. It will not be accurate. If you received sedation, it is likely that you were given a medication that may cause amnesia. Because of this, completing the diary at a later time may cause the information to be inaccurate. This information is needed to plan your care. Follow-up appointment: Keep your post-procedure follow-up evaluation appointment after the procedure (usually 2 weeks for most procedures, 6 weeks for radiofrequencies). DO NOT FORGET to bring you pain diary with you.   Expect: (What should I expect to see with my procedure?) From numbing medicine (AKA: Local Anesthetics): Numbness or decrease in pain. You may also experience some weakness, which if present, could last for the duration of the local anesthetic. Onset: Full effect within 15 minutes of injected. Duration: It will depend on the type of local anesthetic used. On the average, 1 to 8 hours.  From steroids (Applies only if steroids were used): Decrease in swelling or inflammation. Once inflammation is improved, relief of the pain will follow. Onset of benefits: Depends on the amount of swelling present. The more swelling, the longer it will take for the benefits to be seen. In some cases, up to 10 days. Duration: Steroids will stay in the system x 2 weeks. Duration of benefits will depend on multiple posibilities including persistent irritating factors. Side-effects: If present, they  may typically last 2 weeks (the duration of the steroids). Frequent: Cramps (if they occur, drink Gatorade and take over-the-counter Magnesium 450-500 mg once to twice a day); water retention with temporary  weight gain; increases in blood sugar; decreased immune system response; increased appetite. Occasional: Facial flushing (red, warm cheeks); mood swings; menstrual changes. Uncommon: Long-term decrease or suppression of natural hormones; bone thinning. (These are more common with higher doses or more frequent use. This is why we prefer that our patients avoid having any injection therapies in other practices.)  Very Rare: Severe mood changes; psychosis; aseptic necrosis. From procedure: Some discomfort is to be expected once the numbing medicine wears off. This should be minimal if ice and heat are applied as instructed.  Call if: (When should I call?) You experience numbness and weakness that gets worse with time, as opposed to wearing off. New onset bowel or bladder incontinence. (Applies only to procedures done in the spine)  Emergency Numbers: Durning business hours (Monday - Thursday, 8:00 AM - 4:00 PM) (Friday, 9:00 AM - 12:00 Noon): [phone] After hours: [phone] NOTE: If you are having a problem and are unable connect with, or to talk to a provider, then go to your nearest urgent care or emergency department. If the problem is serious and urgent, please call 911. ____________________________________________________________________________________________   ______________________________________________________________________________________________  Body mass index (BMI)  Body mass index (BMI) is a common tool for deciding whether a person has an appropriate body weight.  It measures a persons weight in relation to their height.   According to the [facility] of health (NIH): A BMI of less than 18.5 means that a person is underweight. A BMI of between 18.5 and 24.9 is ideal. A BMI of between 25 and 29.9 is overweight. A BMI over 30 indicates obesity.  Weight Management Required  URGENT: Your weight has been found to be adversely affecting your health.  Dear  Mary Cruz:  Your current Estimated body mass index is 42.23 kg/m as calculated from the following:   Height as of 06/13/21: $RemoveBe'5\' 4"'bFWMwTgcy$  (1.626 m).   Weight as of 06/13/21: 246 lb (111.6 kg).  Please use the table below to identify your weight category and associated incidence of chronic pain, secondary to your weight.  Body Mass Index (BMI) Classification BMI level (kg/m2) Category Associated incidence of chronic pain  <18  Underweight   18.5-24.9 Ideal body weight   25-29.9 Overweight  20%  30-34.9 Obese (Class I)  68%  35-39.9 Severe obesity (Class II)  136%  >40 Extreme obesity (Class III)  254%   In addition: You will be considered "Morbidly Obese", if your BMI is above 30 and you have one or more of the following conditions which are known to be caused and/or directly associated with obesity: 1.    Type 2 Diabetes (Which in turn can lead to cardiovascular diseases (CVD), stroke, peripheral vascular diseases (PVD), retinopathy, nephropathy, and neuropathy) 2.    Cardiovascular Disease (High Blood Pressure; Congestive Heart Failure; High Cholesterol; Coronary Artery Disease; Angina; or History of Heart Attacks) 3.    Breathing problems (Asthma; obesity-hypoventilation syndrome; obstructive sleep apnea; chronic inflammatory airway disease; reactive airway disease; or shortness of breath) 4.    Chronic kidney disease 5.    Liver disease (nonalcoholic fatty liver disease) 6.    High blood pressure 7.    Acid reflux (gastroesophageal reflux disease; heartburn) 8.    Osteoarthritis (OA) (with any of the following: hip pain; knee  pain; and/or low back pain) 9.    Low back pain (Lumbar Facet Syndrome; and/or Degenerative Disc Disease) 10.  Hip pain (Osteoarthritis of hip) (For every 1 lbs of added body weight, there is a 2 lbs increase in pressure inside of each hip articulation. 1:2 mechanical relationship) 11.  Knee pain (Osteoarthritis of knee) (For every 1 lbs of added body weight, there is a 4  lbs increase in pressure inside of each knee articulation. 1:4 mechanical relationship) (patients with a BMI>30 kg/m2 were 6.8 times more likely to develop knee OA than normal-weight individuals) 12.  Cancer: Epidemiological studies have shown that obesity is a risk factor for: post-menopausal breast cancer; cancers of the endometrium, colon and kidney cancer; malignant adenomas of the oesophagus. Obese subjects have an approximately 1.5-3.5-fold increased risk of developing these cancers compared with normal-weight subjects, and it has been estimated that between 15 and 45% of these cancers can be attributed to overweight. More recent studies suggest that obesity may also increase the risk of other types of cancer, including pancreatic, hepatic and gallbladder cancer. (Ref: Obesity and cancer. Pischon T, Nthlings U, [employer] H. Proc Nutr Soc. 2008 May;67(2):128-45. doi: 73.6681/P9470761518343735.) The International Agency for Research on Cancer (IARC) has identified 13 cancers associated with overweight and obesity: meningioma, multiple myeloma, adenocarcinoma of the esophagus, and cancers of the thyroid, postmenopausal breast cancer, gallbladder, stomach, liver, pancreas, kidney, ovaries, uterus, colon and rectal (colorectal) cancers. 35 percent of all cancers diagnosed in women and 24 percent of those diagnosed in men are associated with overweight and obesity.  Recommendation: At this point it is urgent that you take a step back and concentrate in loosing weight. Dedicate 100% of your efforts on this task. Nothing else will improve your health more than bringing your weight down and your BMI to less than 30. If you are here, you probably have chronic pain. We know that most chronic pain patients have difficulty exercising secondary to their pain. For this reason, you must rely on proper nutrition and diet in order to lose the weight. If your BMI is above 40, you should seriously consider bariatric surgery. A  realistic goal is to lose 10% of your body weight over a period of 12 months.  Be honest to yourself, if over time you have unsuccessfully tried to lose weight, then it is time for you to seek professional help and to enter a medically supervised weight management program, and/or undergo bariatric surgery. Stop procrastinating.   Pain management considerations:  1.    Pharmacological Problems: Be advised that the use of opioid analgesics (oxycodone; hydrocodone; morphine; methadone; codeine; and all of their derivatives) have been associated with decreased metabolism and weight gain.  For this reason, should we see that you are unable to lose weight while taking these medications, it may become necessary for [place] to taper down and indefinitely discontinue them.  2.    Technical Problems: The incidence of successful interventional therapies decreases as the patient's BMI increases. It is much more difficult to accomplish a safe and effective interventional therapy on a patient with a BMI above 35. 3.    Radiation Exposure Problems: The x-rays machine, used to accomplish injection therapies, will automatically increase their x-ray output in order to capture an appropriate bone image. This means that radiation exposure increases exponentially with the patient's BMI. (The higher the BMI, the higher the radiation exposure.) Although the level of radiation used at a given time is still safe to the patient, it  is not for the physician and/or assisting staff. Unfortunately, radiation exposure is accumulative. Because physicians and the staff have to do procedures and be exposed on a daily basis, this can result in health problems such as cancer and radiation burns. Radiation exposure to the staff is monitored by the radiation batches that they wear. The exposure levels are reported back to the staff on a quarterly basis. Depending on levels of exposure, physicians and staff may be obligated by law to decrease this  exposure. This means that they have the right and obligation to refuse providing therapies where they may be overexposed to radiation. For this reason, physicians may decline to offer therapies such as radiofrequency ablation or implants to patients with a BMI above 40. 4.    Current Trends: Be advised that the current trend is to no longer offer certain therapies to patients with a BMI equal to, or above 35, due to increase perioperative risks, increased technical procedural difficulties, and excessive radiation exposure to healthcare personnel.  ______________________________________________________________________________________________

## 2021-07-18 ENCOUNTER — Telehealth: Payer: Self-pay

## 2021-07-18 NOTE — Telephone Encounter (Signed)
Post procedure phone call.  Patient states she is  having some pain.  Instructed to use heat and to call us if symptoms increase.

## 2021-07-22 NOTE — Progress Notes (Unsigned)
   Established Patient Office Visit  Subjective   Patient ID: Mary Cruz, female   DOB: 29-Sep-1975 Age: 46 y.o. MRN: [id number]   No chief complaint on file.   HPI Pleasant 46 year old female presenting today for the following:  HTN:  HLD:  Prediabetes:  Mood:  Migraines:  IBS:  IC:  ROS    Objective:    There were no vitals filed for this visit.   Physical Exam    No results found for this or any previous visit (from the past 24 hour(s)).   {Labs (Optional):23779}  The 10-year ASCVD risk score (Arnett DK, et al., 2019) is: 1.4%   Values used to calculate the score:     Age: 40 years     Sex: Female     Is Non-Hispanic African American: No     Diabetic: No     Tobacco smoker: No     Systolic Blood Pressure: 009 mmHg     Is BP treated: Yes     HDL Cholesterol: 44 mg/dL     Total Cholesterol: 205 mg/dL   Assessment & Plan:   No problem-specific Assessment & Plan notes found for this encounter.   No follow-ups on file.  ___________________________________________ Mary Sorrel, DNP, APRN, FNP-BC Primary Care and [place]

## 2021-07-23 ENCOUNTER — Encounter: Payer: Self-pay | Admitting: Medical-Surgical

## 2021-07-23 ENCOUNTER — Ambulatory Visit (INDEPENDENT_AMBULATORY_CARE_PROVIDER_SITE_OTHER): Payer: 59 | Admitting: Medical-Surgical

## 2021-07-23 VITALS — BP 116/81 | HR 97 | Resp 20 | Ht 64.0 in | Wt 253.1 lb

## 2021-07-23 DIAGNOSIS — G43009 Migraine without aura, not intractable, without status migrainosus: Secondary | ICD-10-CM | POA: Diagnosis not present

## 2021-07-23 DIAGNOSIS — R5383 Other fatigue: Secondary | ICD-10-CM

## 2021-07-23 DIAGNOSIS — Z6841 Body Mass Index (BMI) 40.0 and over, adult: Secondary | ICD-10-CM

## 2021-07-23 DIAGNOSIS — K582 Mixed irritable bowel syndrome: Secondary | ICD-10-CM | POA: Diagnosis not present

## 2021-07-23 DIAGNOSIS — F32A Depression, unspecified: Secondary | ICD-10-CM

## 2021-07-23 DIAGNOSIS — N3010 Interstitial cystitis (chronic) without hematuria: Secondary | ICD-10-CM | POA: Diagnosis not present

## 2021-07-23 DIAGNOSIS — G43909 Migraine, unspecified, not intractable, without status migrainosus: Secondary | ICD-10-CM

## 2021-07-23 DIAGNOSIS — F419 Anxiety disorder, unspecified: Secondary | ICD-10-CM

## 2021-07-23 DIAGNOSIS — Z8349 Family history of other endocrine, nutritional and metabolic diseases: Secondary | ICD-10-CM

## 2021-07-23 DIAGNOSIS — E559 Vitamin D deficiency, unspecified: Secondary | ICD-10-CM

## 2021-07-23 DIAGNOSIS — R7303 Prediabetes: Secondary | ICD-10-CM

## 2021-07-23 DIAGNOSIS — M797 Fibromyalgia: Secondary | ICD-10-CM

## 2021-07-23 DIAGNOSIS — G8929 Other chronic pain: Secondary | ICD-10-CM

## 2021-07-23 DIAGNOSIS — E782 Mixed hyperlipidemia: Secondary | ICD-10-CM

## 2021-07-23 DIAGNOSIS — I10 Essential (primary) hypertension: Secondary | ICD-10-CM

## 2021-07-23 DIAGNOSIS — Z1231 Encounter for screening mammogram for malignant neoplasm of breast: Secondary | ICD-10-CM

## 2021-07-23 DIAGNOSIS — M7918 Myalgia, other site: Secondary | ICD-10-CM

## 2021-07-23 MED ORDER — LEVOCETIRIZINE DIHYDROCHLORIDE 5 MG PO TABS: 5.0000 mg | ORAL_TABLET | Freq: Every evening | ORAL | 3 refills | Status: DC

## 2021-07-23 MED ORDER — AMITRIPTYLINE HCL 100 MG PO TABS
ORAL_TABLET | ORAL | 3 refills | Status: DC
Start: 2021-07-23 — End: 2022-05-26

## 2021-07-23 MED ORDER — WEGOVY 0.25 MG/0.5ML ~~LOC~~ SOAJ: 0.2500 mg | SUBCUTANEOUS | 0 refills | Status: DC

## 2021-07-23 MED ORDER — TOPIRAMATE 50 MG PO TABS
25.0000 mg | ORAL_TABLET | Freq: Every day | ORAL | 3 refills | Status: DC
Start: 2021-07-23 — End: 2021-07-23

## 2021-07-23 MED ORDER — DULOXETINE HCL 60 MG PO CPEP: 60.0000 mg | ORAL_CAPSULE | Freq: Two times a day (BID) | ORAL | 3 refills | Status: DC

## 2021-07-23 MED ORDER — RIZATRIPTAN BENZOATE 10 MG PO TABS: 10.0000 mg | ORAL_TABLET | ORAL | 12 refills | Status: DC | PRN

## 2021-07-23 MED ORDER — PANTOPRAZOLE SODIUM 40 MG PO TBEC: 40.0000 mg | DELAYED_RELEASE_TABLET | Freq: Every day | ORAL | 3 refills | Status: DC

## 2021-07-23 MED ORDER — PANTOPRAZOLE SODIUM 40 MG PO TBEC: 40.0000 mg | DELAYED_RELEASE_TABLET | Freq: Every day | ORAL | 0 refills | Status: DC

## 2021-07-23 MED ORDER — TOPIRAMATE 50 MG PO TABS: 50.0000 mg | ORAL_TABLET | Freq: Every day | ORAL | 3 refills | Status: DC

## 2021-07-23 MED ORDER — METHOCARBAMOL 750 MG PO TABS: 750.0000 mg | ORAL_TABLET | Freq: Three times a day (TID) | ORAL | 3 refills | Status: DC | PRN

## 2021-07-23 MED ORDER — HYOSCYAMINE SULFATE 0.125 MG SL SUBL: 0.1250 mg | SUBLINGUAL_TABLET | Freq: Every day | SUBLINGUAL | 3 refills | Status: AC | PRN

## 2021-07-23 MED ORDER — LISINOPRIL-HYDROCHLOROTHIAZIDE 10-12.5 MG PO TABS: 1.0000 | ORAL_TABLET | Freq: Every day | ORAL | 3 refills | Status: DC

## 2021-07-23 NOTE — Patient Instructions (Addendum)
Macro diet  Weight loss tips and tricks:  1.  Make sure to drink at least 64 ounces of water every day. 2.  Avoid alcohol. 3.  Avoid eating within 3 hours of going to bed. 4.  Cut out sugary drinks such as sweet tea, regular sodas, energy drinks, etc. 5.  Make sure you are getting enough sleep every night. 6.  Start making changes by cutting back portion sizes by 1/3. 7.  Keep a food diary to help identify areas for improvement and promote awareness of bad habits. 8.  Increase your activity.  Choose something you like to do that is fun for you so you are more likely to stick with it. 9.  Do not forget your protein! 10.  Measure your neck, upper arms, waist, hips, and thighs and write those measurements down somewhere before you start your weight loss journey.  Changes in your measurements will tell a far more accurate story than the number on a scale. 11.  As you go, pay attention to how your clothes fit! 12.  Weigh yourself as often or as little as you need to.  Some folks do better weighing every day while others do better with once a week. 13.  Do not get discouraged!  Weight loss efforts are meant to be lifestyle changes.  Once you stop a medication (if you are taking 1), your behaviors and habits will determine if you maintain your weight loss or not.  For those on medications:  1.  Take your medication as prescribed every day first thing in the morning. 2.  Common side effects include nausea and constipation.  To combat this, increased your daily water consumption.  Consider adding in a stool softener (available OTC) if needed.  Also increase fiber intake with vegetables and fruits. 3.  If you have any side effects or concerns while taking medication, please do not hesitate to reach out to [place] here at the office. 4.  While on weight loss medications, we do require you to follow-up every 4 weeks with an in office visit.  Refills will not be called in early on controlled substances.  Good  luck on your weight loss journey!  Have faith in your self and you will reach your goals!

## 2021-07-24 LAB — COMPLETE METABOLIC PANEL WITH GFR
AG Ratio: 1.2 (calc) (ref 1.0–2.5)
ALT: 17 U/L (ref 6–29)
AST: 12 U/L (ref 10–35)
Albumin: 4.1 g/dL (ref 3.6–5.1)
Alkaline phosphatase (APISO): 108 U/L (ref 31–125)
BUN/Creatinine Ratio: 16 (calc) (ref 6–22)
BUN: 17 mg/dL (ref 7–25)
CO2: 26 mmol/L (ref 20–32)
Calcium: 9.5 mg/dL (ref 8.6–10.2)
Chloride: 100 mmol/L (ref 98–110)
Creat: 1.08 mg/dL — ABNORMAL HIGH (ref 0.50–0.99)
Globulin: 3.4 g/dL (calc) (ref 1.9–3.7)
Glucose, Bld: 103 mg/dL — ABNORMAL HIGH (ref 65–99)
Potassium: 4.1 mmol/L (ref 3.5–5.3)
Sodium: 136 mmol/L (ref 135–146)
Total Bilirubin: 0.3 mg/dL (ref 0.2–1.2)
Total Protein: 7.5 g/dL (ref 6.1–8.1)
eGFR: 65 mL/min/{1.73_m2} (ref >=60)

## 2021-07-24 LAB — CBC WITH DIFFERENTIAL/PLATELET
Absolute Monocytes: 819 cells/uL (ref 200–950)
Basophils Absolute: 35 cells/uL (ref 0–200)
Basophils Relative: 0.3 %
Eosinophils Absolute: 211 cells/uL (ref 15–500)
Eosinophils Relative: 1.8 %
HCT: 43.5 % (ref 35.0–45.0)
Hemoglobin: 13.9 g/dL (ref 11.7–15.5)
Lymphs Abs: 3089 cells/uL (ref 850–3900)
MCH: 29.1 pg (ref 27.0–33.0)
MCHC: 32 g/dL (ref 32.0–36.0)
MCV: 91.2 fL (ref 80.0–100.0)
MPV: 11.4 fL (ref 7.5–12.5)
Monocytes Relative: 7 %
Neutro Abs: 7547 cells/uL (ref 1500–7800)
Neutrophils Relative %: 64.5 %
Platelets: 367 10*3/uL (ref 140–400)
RBC: 4.77 10*6/uL (ref 3.80–5.10)
RDW: 12.9 % (ref 11.0–15.0)
Total Lymphocyte: 26.4 %
WBC: 11.7 10*3/uL — ABNORMAL HIGH (ref 3.8–10.8)

## 2021-07-24 LAB — LIPID PANEL
Cholesterol: 217 mg/dL — ABNORMAL HIGH (ref <200)
HDL: 59 mg/dL (ref >=50)
LDL Cholesterol (Calc): 136 mg/dL (calc) — ABNORMAL HIGH
Non-HDL Cholesterol (Calc): 158 mg/dL (calc) — ABNORMAL HIGH (ref <130)
Total CHOL/HDL Ratio: 3.7 (calc) (ref <5.0)
Triglycerides: 112 mg/dL (ref <150)

## 2021-07-24 LAB — HEMOGLOBIN A1C
Hgb A1c MFr Bld: 5.7 % of total Hgb — ABNORMAL HIGH (ref <5.7)
Mean Plasma Glucose: 117 mg/dL
eAG (mmol/L): 6.5 mmol/L

## 2021-07-24 LAB — TSH: TSH: 1.45 mIU/L

## 2021-07-24 LAB — IRON,TIBC AND FERRITIN PANEL
%SAT: 13 % (calc) — ABNORMAL LOW (ref 16–45)
Ferritin: 15 ng/mL — ABNORMAL LOW (ref 16–232)
Iron: 70 ug/dL (ref 40–190)
TIBC: 536 mcg/dL (calc) — ABNORMAL HIGH (ref 250–450)

## 2021-07-24 LAB — VITAMIN D 25 HYDROXY (VIT D DEFICIENCY, FRACTURES): Vit D, 25-Hydroxy: 21 ng/mL — ABNORMAL LOW (ref 30–100)

## 2021-07-24 LAB — VITAMIN B12: Vitamin B-12: 439 pg/mL (ref 200–1100)

## 2021-07-25 ENCOUNTER — Telehealth: Payer: Self-pay

## 2021-07-25 ENCOUNTER — Other Ambulatory Visit: Payer: Self-pay | Admitting: Medical-Surgical

## 2021-07-25 DIAGNOSIS — R42 Dizziness and giddiness: Secondary | ICD-10-CM

## 2021-07-25 DIAGNOSIS — R55 Syncope and collapse: Secondary | ICD-10-CM

## 2021-07-25 NOTE — Telephone Encounter (Addendum)
Initiated Prior authorization RWC:HJSCBI 0.'25MG'$ /0.5ML auto-injectors Via: Covermymeds Case/Key:[id number] Status: denied  as of /07/25/21 Reason:The requested medication and/or diagnosis are not a covered benefit and are excluded from coverage in accordance with the terms and conditions of your plan benefit. Therefore, this request has been administratively denied.  Notified Pt via: Mychart

## 2021-07-26 ENCOUNTER — Ambulatory Visit: Payer: 59 | Admitting: Medical-Surgical

## 2021-07-26 ENCOUNTER — Encounter: Payer: Self-pay | Admitting: Medical-Surgical

## 2021-07-26 VITALS — BP 103/65 | HR 107 | Resp 20 | Ht 64.0 in | Wt 253.0 lb

## 2021-07-26 DIAGNOSIS — L989 Disorder of the skin and subcutaneous tissue, unspecified: Secondary | ICD-10-CM | POA: Diagnosis not present

## 2021-07-26 DIAGNOSIS — R202 Paresthesia of skin: Secondary | ICD-10-CM | POA: Diagnosis not present

## 2021-07-26 NOTE — Progress Notes (Signed)
Established Patient Office Visit  Subjective   Patient ID: Mary Cruz, female   DOB: Dec 27, 1975 Age: 46 y.o. MRN: [id number]   Chief Complaint  Patient presents with   mole removal    HPI Pleasant 46 year old female presenting today for the following:  Paresthesias: Has been having swelling as well as tingling in her hands and feet bilaterally.  This has been going on for weeks to months and seems to occur every day.  Some days the swelling is worse than others.  No known contributing or alleviating factors.  Skin lesion: Has had a large mole to her back under the bra line for many years.  Notes it started out small and has continued to get larger.  Has been thinking about getting it removed but finally came in today for the procedure to be done.  Review of Systems  Constitutional:  Negative for chills, fever and malaise/fatigue.  Respiratory:  Negative for cough, shortness of breath and wheezing.   Cardiovascular:  Negative for chest pain, palpitations and leg swelling.  Neurological:  Positive for tingling. Negative for dizziness and headaches.  Psychiatric/Behavioral:  Negative for depression and suicidal ideas. The patient is not nervous/anxious and does not have insomnia.     Objective:    Vitals:   07/26/21 1442  BP: 103/65  Pulse: (!) 107  Resp: 20  Height: '5\' 4"'$  (1.626 m)  Weight: 253 lb (114.8 kg)  SpO2: 96%  BMI (Calculated): 43.41   Physical Exam Vitals and nursing note reviewed.  Constitutional:      General: She is not in acute distress.    Appearance: Normal appearance. She is obese. She is not ill-appearing.  HENT:     Head: Normocephalic and atraumatic.  Cardiovascular:     Rate and Rhythm: Normal rate and regular rhythm.     Pulses: Normal pulses.     Heart sounds: Normal heart sounds.  Pulmonary:     Effort: Pulmonary effort is normal. No respiratory distress.     Breath sounds: Normal breath sounds. No wheezing, rhonchi or rales.  Skin:     General: Skin is warm and dry.     Findings: Lesion (Right mid back ovoid black lesion, borders slightly irregular, entire lesion raised 1-2 mm) present.  Neurological:     Mental Status: She is alert and oriented to person, place, and time.  Psychiatric:        Mood and Affect: Mood normal.        Behavior: Behavior normal.        Thought Content: Thought content normal.        Judgment: Judgment normal.   No results found for this or any previous visit (from the past 24 hour(s)).     The 10-year ASCVD risk score (Arnett DK, et al., 2019) is: 0.7%   Values used to calculate the score:     Age: 69 years     Sex: Female     Is Non-Hispanic African American: No     Diabetic: No     Tobacco smoker: No     Systolic Blood Pressure: 778 mmHg     Is BP treated: Yes     HDL Cholesterol: 59 mg/dL     Total Cholesterol: 217 mg/dL   Assessment & Plan:   1. Skin lesion of back After informed consent was obtained, using chlorhexidine for cleansing and 1% Lidocaine without epinephrine for anesthetic, with sterile technique, shave excision and electrocautery was performed. Antibiotic  dressing is applied, and wound care instructions provided.  Be alert for any signs of cutaneous infection. The procedure was well tolerated without complications. Follow up: the specimen is labeled and sent to pathology for evaluation, the patient may return prn. - Dermatology pathology  2. Paresthesias Unclear etiology.  Recent labs show no concerns for electrolyte derangement or vitamin deficiency.  Consider possible relation to medication side effect.  Advised patient to monitor symptoms and try to correlate worsening or improvement with activities, interventions, foods, or medications.  Patient verbalized understanding and will work on trying to identify contributing factors.  Return for Follow-up as scheduled in July.  ___________________________________________ Clearnce Sorrel, DNP, APRN, FNP-BC Primary Care  and [place]

## 2021-07-31 ENCOUNTER — Ambulatory Visit: Payer: 59 | Attending: Pain Medicine | Admitting: Pain Medicine

## 2021-07-31 DIAGNOSIS — M5136 Other intervertebral disc degeneration, lumbar region: Secondary | ICD-10-CM

## 2021-07-31 DIAGNOSIS — M4316 Spondylolisthesis, lumbar region: Secondary | ICD-10-CM

## 2021-07-31 DIAGNOSIS — M25551 Pain in right hip: Secondary | ICD-10-CM | POA: Diagnosis not present

## 2021-07-31 DIAGNOSIS — M4310 Spondylolisthesis, site unspecified: Secondary | ICD-10-CM | POA: Insufficient documentation

## 2021-07-31 DIAGNOSIS — M5417 Radiculopathy, lumbosacral region: Secondary | ICD-10-CM

## 2021-07-31 DIAGNOSIS — M5416 Radiculopathy, lumbar region: Secondary | ICD-10-CM

## 2021-07-31 DIAGNOSIS — G8929 Other chronic pain: Secondary | ICD-10-CM

## 2021-07-31 HISTORY — DX: Spondylolisthesis, lumbar region: M43.16

## 2021-07-31 HISTORY — DX: Spondylolisthesis, site unspecified: M43.10

## 2021-07-31 HISTORY — DX: Radiculopathy, lumbosacral region: M54.17

## 2021-07-31 NOTE — Patient Instructions (Signed)
______________________________________________________________________  Preparing for Procedure with Sedation  NOTICE: Due to recent regulatory changes, starting on September 11, 2020, procedures requiring intravenous (IV) sedation will no longer be performed at the [facility].  These types of procedures are required to be performed at [facility] ambulatory surgery facility.  We are very sorry for the inconvenience.  Procedure appointments are limited to planned procedures: No Prescription Refills. No disability issues will be discussed. No medication changes will be discussed.  Instructions: Oral Intake: Do not eat or drink anything for at least 8 hours prior to your procedure. (Exception: Blood Pressure Medication. See below.) Transportation: A driver is required. You may not drive yourself after the procedure. Blood Pressure Medicine: Do not forget to take your blood pressure medicine with a sip of water the morning of the procedure. If your Diastolic (lower reading) is above 100 mmHg, elective cases will be cancelled/rescheduled. Blood thinners: These will need to be stopped for procedures. Notify our staff if you are taking any blood thinners. Depending on which one you take, there will be specific instructions on how and when to stop it. Diabetics on insulin: Notify the staff so that you can be scheduled 1st case in the morning. If your diabetes requires high dose insulin, take only  of your normal insulin dose the morning of the procedure and notify the staff that you have done so. Preventing infections: Shower with an antibacterial soap the morning of your procedure. Build-up your immune system: Take 1000 mg of Vitamin C with every meal (3 times a day) the day prior to your procedure. Antibiotics: Inform the staff if you have a condition or reason that requires you to take antibiotics before dental procedures. Pregnancy: If you are pregnant, call and cancel the procedure. Sickness: If  you have a cold, fever, or any active infections, call and cancel the procedure. Arrival: You must be in the facility at least 30 minutes prior to your scheduled procedure. Children: Do not bring children with you. Dress appropriately: There is always the possibility that your clothing may get soiled. Valuables: Do not bring any jewelry or valuables.  Reasons to call and reschedule or cancel your procedure: (Following these recommendations will minimize the risk of a serious complication.) Surgeries: Avoid having procedures within 2 weeks of any surgery. (Avoid for 2 weeks before or after any surgery). Flu Shots: Avoid having procedures within 2 weeks of a flu shots. (Avoid for 2 weeks before or after immunizations). Barium: Avoid having a procedure within 7-10 days after having had a radiological study involving the use of radiological contrast. (Myelograms, Barium swallow or enema study). Heart attacks: Avoid any elective procedures or surgeries for the initial 6 months after a "Myocardial Infarction" (Heart Attack). Blood thinners: It is imperative that you stop these medications before procedures. Let us know if you if you take any blood thinner.  Infection: Avoid procedures during or within two weeks of an infection (including chest colds or gastrointestinal problems). Symptoms associated with infections include: Localized redness, fever, chills, night sweats or profuse sweating, burning sensation when voiding, cough, congestion, stuffiness, runny nose, sore throat, diarrhea, nausea, vomiting, cold or Flu symptoms, recent or current infections. It is specially important if the infection is over the area that we intend to treat. Heart and lung problems: Symptoms that may suggest an active cardiopulmonary problem include: cough, chest pain, breathing difficulties or shortness of breath, dizziness, ankle swelling, uncontrolled high or unusually low blood pressure, and/or palpitations. If you are    experiencing any of these symptoms, cancel your procedure and contact your primary care physician for an evaluation.  Remember:  Regular Business hours are:  Monday to Thursday 8:00 AM to 4:00 PM  Provider's Schedule: Dilynn Munroe, MD:  Procedure days: Tuesday and Thursday 7:30 AM to 4:00 PM  Bilal Lateef, MD:  Procedure days: Monday and Wednesday 7:30 AM to 4:00 PM ______________________________________________________________________  ____________________________________________________________________________________________  General Risks and Possible Complications  Patient Responsibilities: It is important that you read this as it is part of your informed consent. It is our duty to inform you of the risks and possible complications associated with treatments offered to you. It is your responsibility as a patient to read this and to ask questions about anything that is not clear or that you believe was not covered in this document.  Patient's Rights: You have the right to refuse treatment. You also have the right to change your mind, even after initially having agreed to have the treatment done. However, under this last option, if you wait until the last second to change your mind, you may be charged for the materials used up to that point.  Introduction: Medicine is not an exact science. Everything in Medicine, including the lack of treatment(s), carries the potential for danger, harm, or loss (which is by definition: Risk). In Medicine, a complication is a secondary problem, condition, or disease that can aggravate an already existing one. All treatments carry the risk of possible complications. The fact that a side effects or complications occurs, does not imply that the treatment was conducted incorrectly. It must be clearly understood that these can happen even when everything is done following the highest safety standards.  No treatment: You can choose not to proceed with the  proposed treatment alternative. The "PRO(s)" would include: avoiding the risk of complications associated with the therapy. The "CON(s)" would include: not getting any of the treatment benefits. These benefits fall under one of three categories: diagnostic; therapeutic; and/or palliative. Diagnostic benefits include: getting information which can ultimately lead to improvement of the disease or symptom(s). Therapeutic benefits are those associated with the successful treatment of the disease. Finally, palliative benefits are those related to the decrease of the primary symptoms, without necessarily curing the condition (example: decreasing the pain from a flare-up of a chronic condition, such as incurable terminal cancer).  General Risks and Complications: These are associated to most interventional treatments. They can occur alone, or in combination. They fall under one of the following six (6) categories: no benefit or worsening of symptoms; bleeding; infection; nerve damage; allergic reactions; and/or death. No benefits or worsening of symptoms: In Medicine there are no guarantees, only probabilities. No healthcare provider can ever guarantee that a medical treatment will work, they can only state the probability that it may. Furthermore, there is always the possibility that the condition may worsen, either directly, or indirectly, as a consequence of the treatment. Bleeding: This is more common if the patient is taking a blood thinner, either prescription or over the counter (example: Goody Powders, Fish oil, Aspirin, Garlic, etc.), or if suffering a condition associated with impaired coagulation (example: Hemophilia, cirrhosis of the liver, low platelet counts, etc.). However, even if you do not have one on these, it can still happen. If you have any of these conditions, or take one of these drugs, make sure to notify your treating physician. Infection: This is more common in patients with a compromised  immune system, either due to disease (example:   diabetes, cancer, human immunodeficiency virus [HIV], etc.), or due to medications or treatments (example: therapies used to treat cancer and rheumatological diseases). However, even if you do not have one on these, it can still happen. If you have any of these conditions, or take one of these drugs, make sure to notify your treating physician. Nerve Damage: This is more common when the treatment is an invasive one, but it can also happen with the use of medications, such as those used in the treatment of cancer. The damage can occur to small secondary nerves, or to large primary ones, such as those in the spinal cord and brain. This damage may be temporary or permanent and it may lead to impairments that can range from temporary numbness to permanent paralysis and/or brain death. Allergic Reactions: Any time a substance or material comes in contact with our body, there is the possibility of an allergic reaction. These can range from a mild skin rash (contact dermatitis) to a severe systemic reaction (anaphylactic reaction), which can result in death. Death: In general, any medical intervention can result in death, most of the time due to an unforeseen complication. ____________________________________________________________________________________________  

## 2021-07-31 NOTE — Progress Notes (Signed)
Patient: Mary Cruz  Service Category: E/M  Provider: Gaspar Cola, MD  DOB: 02-24-1975  DOS: 07/31/2021  Location: Office  MRN: [id number]  Setting: Ambulatory outpatient  Referring Provider: Samuel Bouche, NP  Type: Established Patient  Specialty: Interventional Pain Management  PCP: Samuel Bouche, NP  Location: Remote location  Delivery: TeleHealth     Virtual Encounter - Pain Management PROVIDER NOTE: Information contained herein reflects review and annotations entered in association with encounter. Interpretation of such information and data should be left to medically-trained personnel. Information provided to patient can be located elsewhere in the medical record under "Patient Instructions". Document created using STT-dictation technology, any transcriptional errors that may result from process are unintentional.    Contact & Pharmacy Preferred: [phone] Home: [phone] (home) Mobile: [phone] (mobile) E-mail: [email or web address]  [facility] [place], [place] - [address] [place] [address] Phone: [phone] Fax: [phone]   Pre-screening  Mary Cruz offered "in-person" vs "virtual" encounter. She indicated preferring virtual for this encounter.   Reason COVID-19*  Social distancing based on CDC and AMA recommendations.   I contacted [name] on 07/31/2021 via telephone.      I clearly identified myself as 08/13/2021, MD. I verified that I was speaking with the correct person using two identifiers (Name: Mary Cruz, and date of birth: 01-16-1976).  Consent I sought verbal advanced consent from 11/21/1975 for virtual visit interactions. I informed Mary Cruz of possible security and privacy concerns, risks, and limitations associated with providing "not-in-person" medical evaluation and management services. I also informed Mary Cruz of the availability of  "in-person" appointments. Finally, I informed her that there would be a charge for the virtual visit and that she could be  personally, fully or partially, financially responsible for it. Mary Cruz expressed understanding and agreed to proceed.   Historic Elements   [name] is a 46 y.o. year old, female patient evaluated today after our last contact on 07/17/2021. [name]  has a past medical history of Abscess of axillary fold (10/10/2013), Acute bronchitis with asthma with acute exacerbation (01/23/2015), Dehydration (04/19/2019), Elevated lactic acid level (04/19/2019), Encounter for management of implanted device (12/19/2014), Encounter for therapeutic drug level monitoring (12/19/2014), Fibromyalgia, GERD (gastroesophageal reflux disease), Hyperglycemia (04/19/2019), Hypertension, Implant pain at battery site (right buttocks area) (04/05/2015), KIDNEY STONES (07/02/2007), Long term current use of opiate analgesic (12/19/2014), Morbid obesity ([place]) (12/19/2014), Muscle cramping (12/19/2014), Opiate use (30 MME/Day) (12/19/2014), Osteoarthritis, Pain, Right knee pain (01/10/2015), Sepsis ([place]) (04/19/2019), Sepsis affecting skin (10/10/2013), and Tobacco abuse (04/19/2019). She also  has a past surgical history that includes spinal cord stimulator; Occipital nerve stimulator insertion; Cholecystectomy (2000); Knee surgery; Shoulder surgery; Tubal ligation (2000); Endometrial ablation; Spinal cord stimulator implant (2008); Knee surgery (Right, 02/2015); Breast surgery; Spinal cord stimulator insertion (N/A, 06/21/2016); Shoulder arthroscopy with distal clavicle resection (Right, 10/29/2018); Subacromial decompression (Right, 10/29/2018); laparoscopic appendectomy (Right, 01/01/2020); Appendectomy; Ankle surgery; Colonoscopy with esophagogastroduodenoscopy (egd); Colonoscopy (N/A, 02/20/2021); and Esophagogastroduodenoscopy (N/A, 02/20/2021). [name] has a current medication list which  includes the following prescription(s): amitriptyline, duloxetine, hyoscyamine, levocetirizine, lisinopril-hydrochlorothiazide, methocarbamol, oxycodone, [START ON 08/11/2021] oxycodone, [START ON 09/10/2021] oxycodone, pantoprazole, probiotic product, rizatriptan, and topiramate. She  reports that she quit smoking about 14 months ago. Her smoking use included cigarettes. She quit smokeless tobacco use about 3 years ago. She reports that she does not drink alcohol and does  not use drugs. Mary Cruz is allergic to celecoxib, doxycycline, gabapentin, latex, nabumetone, pentazocine lactate, seroquel [quetiapine], amoxicillin, bactrim [sulfamethoxazole-trimethoprim], cephalexin, erythromycin, hydromorphone hcl, and lyrica [pregabalin].   HPI  Today, she is being contacted for a post-procedure assessment.  From the description of the diagnostic portion of the procedure it would seem that the hip injection did provide her with good relief of the pain in the groin area, buttocks, and some partial relief are along the lateral aspect of the hip, while the numbing medicine was in place.  However, during that same.  Time she noticed that the pain in the lateral aspect of the hip did not completely go away, she has also been experiencing more pain in the right knee, and she also has pain going all the way down into the inner portion of her foot (L4) as well as the top of her foot (L5) suggesting that although she does seem to have a component of her pain that was coming from the hip joint, the great majority of it is not.  Review of her medical records and imaging reveals a history of a grade 1 retrolisthesis of L4 over L5 which could affect the L4 nerve at the level of the neural foramina and the L5 at the level of the lateral recess and central canal.  A radiculopathy/radiculitis affecting the right L4 and L5 nerve roots would clearly explain the results seen with her recent diagnostic right IA hip injection.  At this  point, the plan is to do an x-ray of the lumbar spine on flexion and extension to evaluate for possible instability of the L4-5 retrolisthesis.  In addition, we will plan to bring her back to a right L4 transforaminal (L4-5) ESI + either a right L5 TFESI vs a right L5-S1 LESI.  This plan was shared with the patient who understood and accepted.  Post-procedure evaluation    Procedure:          Anesthesia, Analgesia, Anxiolysis:  Type: Intra-Articular Hip Injection #1  Primary Purpose: Diagnostic Region: Posterolateral hip joint area. Level: Lower pelvic and hip joint level. Target Area: Superior aspect of the hip joint cavity, going thru the superior portion of the capsular ligament. Approach: Posterolateral approach. Laterality: Right  Anesthesia: Local (1-2% Lidocaine)  Anxiolysis: IV Versed 2 mg Sedation: None  Guidance: Fluoroscopy           Position: Lateral Decubitus with bad side up Prepped Area: Entire Posterolateral hip area. DuraPrep (Iodine Povacrylex [0.7% available iodine] and Isopropyl Alcohol, 74% w/w)   1. Chronic hip pain (Right)   2. Osteoarthritis of hip (Right)   3. Osteoarthritis involving multiple joints   4. Morbid obesity with BMI of 40.0-44.9, adult (HCC)    NAS-11 Pain score:   Pre-procedure: 7 /10   Post-procedure: 4 /10     Effectiveness:  Initial hour after procedure: 60 %. Subsequent 4-6 hours post-procedure: 60 %. Analgesia past initial 6 hours: 0 % (pain returned within a few days.  patient does not feel that it was ever completely affective.). Ongoing improvement:  Analgesic: The patient indicates that the pain that she was experiencing in the groin area and buttocks did completely go away for the duration of the numbing medicine.  However, some of the pain that she was experiencing in the lateral aspect of her upper leg as well as the pain that she is experiencing all the way down into her foot, but did not go away.  She indicates that the pain  that she is experiencing in her foot is in the inside portion of the foot (L4) as well as the top of the foot (L5) she also indicates having pain over her right knee.  The pattern that she is describing is highly suspicious for lumbar radiculopathy/radiculitis. Function: Somewhat improved ROM: Somewhat improved  Pharmacotherapy Assessment   Opioid Analgesic: Oxycodone IR 5 mg, 1 tab PO q 6 hrs (20 mg/day of oxycodone) MME/day: 30 mg/day.   Monitoring: [place] PMP: PDMP reviewed during this encounter.       Pharmacotherapy: No side-effects or adverse reactions reported. Compliance: No problems identified. Effectiveness: Clinically acceptable. Plan: Refer to "POC". UDS:  Summary  Date Value Ref Range Status  07/31/2020 Note  Final    Comment:    ==================================================================== [employer] [facility] [employer] (MW) ==================================================================== Test                             Result       Flag       Units  Drug Present and Declared for Prescription Verification   Oxycodone                      1766         EXPECTED   ng/mg creat   Oxymorphone                    1326         EXPECTED   ng/mg creat   Noroxycodone                   2915         EXPECTED   ng/mg creat   Noroxymorphone                 492          EXPECTED   ng/mg creat    Sources of oxycodone are scheduled prescription medications.    Oxymorphone, noroxycodone, and noroxymorphone are expected    metabolites of oxycodone. Oxymorphone is also available as a    scheduled prescription medication.  Drug Absent but Declared for Prescription Verification   Clonazepam                     Not Detected UNEXPECTED ng/mg creat ==================================================================== Test                      Result    Flag   Units      Ref Range   Creatinine              205              mg/dL       >=20 ==================================================================== Declared Medications:  The flagging and interpretation on this report are based on the  following declared medications.  Unexpected results may arise from  inaccuracies in the declared medications.   **Note: The testing scope of this panel includes these medications:   Clonazepam (Klonopin)  Oxycodone (Roxicodone)   **Note: The testing scope of this panel does not include the  following reported medications:   Amitriptyline (Elavil)  Hydrochlorothiazide (Zestoretic)  Hyoscyamine  Lisinopril (Zestoretic)  Methocarbamol (Robaxin)  Omeprazole (Prilosec)  Rizatriptan (Maxalt) ==================================================================== For clinical consultation, please call [phone]. ====================================================================      Laboratory Chemistry Profile   Renal Lab Results  Component Value Date   BUN 17  07/23/2021   CREATININE 1.08 (H) 07/23/2021   LABCREA 51 11/20/2020   BCR 16 07/23/2021   GFRAA 84 08/03/2020   GFRNONAA 34 (L) 01/13/2021    Hepatic Lab Results  Component Value Date   AST 12 07/23/2021   ALT 17 07/23/2021   ALBUMIN 3.8 01/13/2021   ALKPHOS 96 01/13/2021   LIPASE 20 01/13/2021    Electrolytes Lab Results  Component Value Date   NA 136 07/23/2021   K 4.1 07/23/2021   CL 100 07/23/2021   CALCIUM 9.5 07/23/2021   MG 2.0 04/20/2019   PHOS 3.2 04/20/2019    Bone Lab Results  Component Value Date   VD25OH 21 (L) 07/23/2021    Inflammation (CRP: Acute Phase) (ESR: Chronic Phase) Lab Results  Component Value Date   CRP 1.0 (H) 04/05/2015   ESRSEDRATE 28 (H) 11/20/2020   LATICACIDVEN 1.8 04/20/2019         Note: Above Lab results reviewed.  Imaging  DG PAIN CLINIC C-ARM 1-60 MIN NO REPORT Fluoro was used, but no Radiologist interpretation will be provided.  Please refer to "NOTES" tab for provider progress  note.  Assessment  The primary encounter diagnosis was Chronic hip pain (Right). 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  Plan of Care  Problem-specific:  No problem-specific Assessment & Plan notes found for this encounter.  Ms. ZARAHI FUERST has a current medication list which includes the following long-term medication(s): amitriptyline, duloxetine, hyoscyamine, levocetirizine, lisinopril-hydrochlorothiazide, methocarbamol, pantoprazole, rizatriptan, and topiramate.  Pharmacotherapy (Medications Ordered): No orders of the defined types were placed in this encounter.  Orders:  Orders Placed This Encounter  Procedures   Lumbar Transforaminal Epidural    Standing Status:   Future    Standing Expiration Date:   10/31/2021    Scheduling Instructions:     Laterality: Right-sided     Level(s): L4             Sedation: Patient's choice.     Timeframe: ASAP    Order Specific Question:   Where will this procedure be performed?    Answer:   [facility]   Lumbar Epidural Injection    Standing Status:   Future    Standing Expiration Date:   10/31/2021    Scheduling Instructions:     Procedure: Interlaminar Lumbar Epidural Steroid injection (LESI)  L5-S1     Laterality: Right-sided     Sedation: Patient's choice.     Timeframe: ASAA    Order Specific Question:   Where will this procedure be performed?    Answer:   [facility]   DG Lumbar Spine Complete W/Bend    Patient presents with axial pain with possible radicular component. Please assist [place] in identifying specific level(s) and laterality of any additional findings such as: 1. Facet (Zygapophyseal) joint DJD (Hypertrophy, space narrowing, subchondral  sclerosis, and/or osteophyte formation) 2. DDD and/or IVDD (Loss of disc height, desiccation, gas patterns, osteophytes, endplate sclerosis, or "Black disc disease") 3. Pars defects 4. Spondylolisthesis, spondylosis, and/or spondyloarthropathies (include Degree/Grade of displacement in mm) (stability) 5. Vertebral body Fractures (acute/chronic) (state percentage of collapse) 6. Demineralization (osteopenia/osteoporotic) 7. Bone pathology 8. Foraminal narrowing  9. Surgical changes    Standing Status:   Future    Standing Expiration Date:  08/30/2021    Scheduling Instructions:     Imaging must be done as soon as possible. Inform patient that order will expire within 30 days and I will not renew it.    Order Specific Question:   Reason for Exam (SYMPTOM  OR DIAGNOSIS REQUIRED)    Answer:   Low back pain    Order Specific Question:   Is patient pregnant?    Answer:   No    Order Specific Question:   Preferred imaging location?    Answer:   [facility]    Order Specific Question:   Call Results- Best Contact Number?    Answer:   [phone] ([facility])    Order Specific Question:   Radiology Contrast Protocol - do NOT remove file path    Answer:   [email or web address]    Order Specific Question:   Release to patient    Answer:   Immediate   Follow-up plan:   Return for (ECT) procedure: (R) L4 TFESI #1 + (R) L5-S1 LESI #1.     Interventional Therapies  Risk  Complexity Considerations:   Estimated body mass index is 41.2 kg/m as calculated from the following:   Height as of this encounter: 5' 4"  (1.626 m).   Weight as of this encounter: 240 lb (108.9 kg). Latex allergy    Planned  Pending:   Diagnostic right hip x-rays and MRI (06/13/2021) Diagnostic/therapeutic right IA hip joint inj. #1    Under consideration:   Diagnostic/therapeutic right IA hip joint inj. #1  Diagnostic right suprascapular NB #1    Completed:    Palliative bilateral lumbar facet MBB x2 (11/16/2020) (90/90/80/85-90)  Therapeutic left lumbar facet MBB x2 (10/10/2016) (100/100/100/100)  Therapeutic right lumbar facet MBB x1 (08/31/2015) (n/a)  Therapeutic right lumbar facet RFA x3 (05/08/2021) (100/100/50/50) on the right side Therapeutic left lumbar facet RFA x3 (03/22/2021) (100/100/99/99) on left side Palliative left C7-T1 CESI x1 (04/13/2015) (n/a)    Therapeutic  Palliative (PRN) options:   Palliative lumbar facet MBB  Palliative lumbar facet RFA  Palliative C7-T1 CESI  PRN SCS programming adjustments (2018 replacement by Dr. Clydell Hakim)    Recent Visits Date Type Provider Dept  07/17/21 Procedure visit Milinda Pointer, MD [facility]  06/13/21 Office Visit Milinda Pointer, MD [facility]  05/08/21 Procedure visit Milinda Pointer, MD [facility]  Showing recent visits within past 90 days and meeting all other requirements Today's Visits Date Type Provider Dept  07/31/21 Office Visit Milinda Pointer, MD [facility]  Showing today's visits and meeting all other requirements Future Appointments Date Type Provider Dept  09/03/21 Appointment Milinda Pointer, MD [facility]  Showing future appointments within next 90 days and meeting all other requirements  I discussed the assessment and treatment plan with the patient. The patient was provided an opportunity to ask questions and all were answered. The patient agreed with the plan and demonstrated an understanding of the instructions.  Patient advised to call back or seek an in-person evaluation if the symptoms or condition worsens.  Duration of encounter: 24 minutes.  Note by: Gaspar Cola, MD Date: 07/31/2021; Time: 4:22 PM

## 2021-08-06 ENCOUNTER — Encounter: Payer: Self-pay | Admitting: Medical-Surgical

## 2021-08-15 ENCOUNTER — Telehealth: Payer: Self-pay

## 2021-08-15 NOTE — Telephone Encounter (Signed)
Insurance Treatment Denial Note  Date order was entered:  Order entered by: Milinda Pointer, MD Requested treatment: LESI Reason for denial:  no evidence of nerve impingement Recommended for approval:  Denied due to no evidence of nerve impingement

## 2021-09-02 NOTE — Progress Notes (Deleted)
PROVIDER NOTE: Information contained herein reflects review and annotations entered in association with encounter. Interpretation of such information and data should be left to medically-trained personnel. Information provided to patient can be located elsewhere in the medical record under "Patient Instructions". Document created using STT-dictation technology, any transcriptional errors that may result from process are unintentional.    Patient: Mary Cruz  Service Category: E/M  Provider: Gaspar Cola, MD  DOB: Jul 25, 1975  DOS: 09/03/2021  Specialty: Interventional Pain Management  MRN: [id number]  Setting: Ambulatory outpatient  PCP: Samuel Bouche, NP  Type: Established Patient    Referring Provider: Samuel Bouche, NP  Location: Office  Delivery: Face-to-face     HPI  [name], a 46 y.o. year old female, is here today because of her Chronic pain syndrome [G89.4]. [name] primary complain today is No chief complaint on file. Last encounter: My last encounter with her was on 07/31/2021. Pertinent problems: [name] has Chronic pain syndrome; Chronic musculoskeletal pain; Osteoarthrosis; Presence of functional implant ([employer]tr[employer] Lumbar spinal cord stimulator implant); Chronic low back pain (Bilateral) (R>L) w/o sciatica; Chronic lumbar radicular pain (Right L5 dermatome; Left S1 Dermatome) (Bilateral) (R>L); Chronic neck pain (1ry area of Pain) (Bilateral) (L>R); Chronic cervical radicular pain (C7 Dermatome) (Bilateral) (L>R); Chronic lower extremity pain (Bilateral) (R>L); Lumbar spondylosis; Lumbar facet syndrome (Bilateral) (R>L); Neurogenic pain; Fibromyalgia; Pain in right knee; Chronic upper extremity pain (2ry area of Pain) (Bilateral) (L>R); Chronic hip pain (3ry area of Pain) (Bilateral) (R>L); Closed fracture of scapula, sequela (Right); DDD (degenerative disc disease), cervical; Cervicalgia; Abnormal MRI, shoulder (Right); Spondylosis without myelopathy or  radiculopathy, lumbosacral region; DDD (degenerative disc disease), lumbar; Osteoarthritis involving multiple joints; Chronic low back pain (Right) w/ sciatica (Right); Chronic lower extremity pain (Right); Lumbosacral radiculopathy at L5 (Right); Dropfoot (Right); Lumbar facet arthropathy; Intractable low back pain; Closed fracture of multiple ribs of right side; Chronic feet pain (Bilateral); Migraines; Chronic hip pain (Right); Osteoarthritis of hip (Right); Grade 1 Retrolisthesis of L4/L5; Spondylolisthesis at L4-L5 level; and Lumbosacral radiculopathy/radiculitis at L4 (Right) on their pertinent problem list. Pain Assessment: Severity of   is reported as a  /10. Location:    / . Onset:  . Quality:  . Timing:  . Modifying factor(s):  [name] Vitals:  vitals were not taken for this visit.   Reason for encounter: medication management. ***  UDS ordered today.   RTCB: 12/09/2021 Nonopioids transferred 04/06/2020: Robaxin  Pharmacotherapy Assessment  Analgesic: Oxycodone IR 5 mg, 1 tab PO q 6 hrs (20 mg/day of oxycodone) MME/day: 30 mg/day.   Monitoring: [place] PMP: PDMP reviewed during this encounter.       Pharmacotherapy: No side-effects or adverse reactions reported. Compliance: No problems identified. Effectiveness: Clinically acceptable.  No notes on file  UDS:  Summary  Date Value Ref Range Status  07/31/2020 Note  Final    Comment:    ==================================================================== [employer] [facility] [employer] (MW) ==================================================================== Test                             Result       Flag       Units  Drug Present and Declared for Prescription Verification   Oxycodone                      1766         EXPECTED   ng/mg creat   Oxymorphone  1326         EXPECTED   ng/mg creat   Noroxycodone                   2915         EXPECTED   ng/mg creat   Noroxymorphone                 492          EXPECTED   ng/mg creat     Sources of oxycodone are scheduled prescription medications.    Oxymorphone, noroxycodone, and noroxymorphone are expected    metabolites of oxycodone. Oxymorphone is also available as a    scheduled prescription medication.  Drug Absent but Declared for Prescription Verification   Clonazepam                     Not Detected UNEXPECTED ng/mg creat ==================================================================== Test                      Result    Flag   Units      Ref Range   Creatinine              205              mg/dL      >=20 ==================================================================== Declared Medications:  The flagging and interpretation on this report are based on the  following declared medications.  Unexpected results may arise from  inaccuracies in the declared medications.   **Note: The testing scope of this panel includes these medications:   Clonazepam (Klonopin)  Oxycodone (Roxicodone)   **Note: The testing scope of this panel does not include the  following reported medications:   Amitriptyline (Elavil)  Hydrochlorothiazide (Zestoretic)  Hyoscyamine  Lisinopril (Zestoretic)  Methocarbamol (Robaxin)  Omeprazole (Prilosec)  Rizatriptan (Maxalt) ==================================================================== For clinical consultation, please call [phone]. ====================================================================      ROS  Constitutional: Denies any fever or chills Gastrointestinal: No reported hemesis, hematochezia, vomiting, or acute GI distress Musculoskeletal: Denies any acute onset joint swelling, redness, loss of ROM, or weakness Neurological: No reported episodes of acute onset apraxia, aphasia, dysarthria, agnosia, amnesia, paralysis, loss of coordination, or loss of consciousness  Medication Review  DULoxetine, Probiotic Product, amitriptyline, hyoscyamine, levocetirizine, lisinopril-hydrochlorothiazide,  methocarbamol, oxyCODONE, pantoprazole, rizatriptan, and topiramate  History Review  Allergy: Ms. Freehling is allergic to celecoxib, doxycycline, gabapentin, latex, nabumetone, pentazocine lactate, seroquel [quetiapine], amoxicillin, bactrim [sulfamethoxazole-trimethoprim], cephalexin, erythromycin, hydromorphone hcl, and lyrica [pregabalin]. Drug: Ms. Anagnos  reports no history of drug use. Alcohol:  reports no history of alcohol use. Tobacco:  reports that she quit smoking about 15 months ago. Her smoking use included cigarettes. She quit smokeless tobacco use about 3 years ago. Social: Ms. Stainback  reports that she quit smoking about 15 months ago. Her smoking use included cigarettes. She quit smokeless tobacco use about 3 years ago. She reports that she does not drink alcohol and does not use drugs. Medical:  has a past medical history of Abscess of axillary fold (10/10/2013), Acute bronchitis with asthma with acute exacerbation (01/23/2015), Dehydration (04/19/2019), Elevated lactic acid level (04/19/2019), Encounter for management of implanted device (12/19/2014), Encounter for therapeutic drug level monitoring (12/19/2014), Fibromyalgia, GERD (gastroesophageal reflux disease), Hyperglycemia (04/19/2019), Hypertension, Implant pain at battery site (right buttocks area) (04/05/2015), KIDNEY STONES (07/02/2007), Long term current use of opiate analgesic (12/19/2014), Morbid obesity ([place]) (12/19/2014), Muscle cramping (12/19/2014), Opiate use (30 MME/Day) (12/19/2014), Osteoarthritis, Pain, Right  knee pain (01/10/2015), Sepsis ([place]) (04/19/2019), Sepsis affecting skin (10/10/2013), and Tobacco abuse (04/19/2019). Surgical: Ms. Disanti  has a past surgical history that includes spinal cord stimulator; Occipital nerve stimulator insertion; Cholecystectomy (2000); Knee surgery; Shoulder surgery; Tubal ligation (2000); Endometrial ablation; Spinal cord stimulator implant (2008); Knee surgery (Right,  02/2015); Breast surgery; Spinal cord stimulator insertion (N/A, 06/21/2016); Shoulder arthroscopy with distal clavicle resection (Right, 10/29/2018); Subacromial decompression (Right, 10/29/2018); laparoscopic appendectomy (Right, 01/01/2020); Appendectomy; Ankle surgery; Colonoscopy with esophagogastroduodenoscopy (egd); Colonoscopy (N/A, 02/20/2021); and Esophagogastroduodenoscopy (N/A, 02/20/2021). Family: family history includes Asthma in her mother; Depression in her mother; Fibromyalgia in an other family member; Stroke in an other family member; Thyroid disease in an other family member.  Laboratory Chemistry Profile   Renal Lab Results  Component Value Date   BUN 17 07/23/2021   CREATININE 1.08 (H) 07/23/2021   LABCREA 51 11/20/2020   BCR 16 07/23/2021   GFRAA 84 08/03/2020   GFRNONAA 34 (L) 01/13/2021    Hepatic Lab Results  Component Value Date   AST 12 07/23/2021   ALT 17 07/23/2021   ALBUMIN 3.8 01/13/2021   ALKPHOS 96 01/13/2021   LIPASE 20 01/13/2021    Electrolytes Lab Results  Component Value Date   NA 136 07/23/2021   K 4.1 07/23/2021   CL 100 07/23/2021   CALCIUM 9.5 07/23/2021   MG 2.0 04/20/2019   PHOS 3.2 04/20/2019    Bone Lab Results  Component Value Date   VD25OH 21 (L) 07/23/2021    Inflammation (CRP: Acute Phase) (ESR: Chronic Phase) Lab Results  Component Value Date   CRP 1.0 (H) 04/05/2015   ESRSEDRATE 28 (H) 11/20/2020   LATICACIDVEN 1.8 04/20/2019         Note: Above Lab results reviewed.  Recent Imaging Review  DG PAIN CLINIC C-ARM 1-60 MIN NO REPORT Fluoro was used, but no Radiologist interpretation will be provided.  Please refer to "NOTES" tab for provider progress note. Note: Reviewed        Physical Exam  General appearance: Well nourished, well developed, and well hydrated. In no apparent acute distress Mental status: Alert, oriented x 3 (person, place, & time)       Respiratory: No evidence of acute respiratory  distress Eyes: PERLA Vitals: There were no vitals taken for this visit. BMI: Estimated body mass index is 43.43 kg/m as calculated from the following:   Height as of 07/26/21: 5' 4"  (1.626 m).   Weight as of 07/26/21: 253 lb (114.8 kg). Ideal: Patient weight not recorded  Assessment   Diagnosis Status  1. Chronic pain syndrome   2. Lumbar facet syndrome (Bilateral) (R>L)   3. Chronic neck pain (1ry area of Pain) (Bilateral) (L>R)   4. Chronic upper extremity pain (2ry area of Pain) (Bilateral) (L>R)   5. Chronic hip pain (3ry area of Pain) (Bilateral) (R>L)   6. Intractable low back pain   7. Fibromyalgia   8. Presence of functional implant (Medtr[employer] Lumbar spinal cord stimulator implant)   9. Pharmacologic therapy   10. Chronic use of opiate for therapeutic purpose   11. Encounter for medication management   12. Encounter for chronic pain management    Controlled Controlled Controlled   Updated Problems: No problems updated.  Plan of Care  Problem-specific:  No problem-specific Assessment & Plan notes found for this encounter.  Ms. VESPER TRANT has a current medication list which includes the following long-term medication(s): amitriptyline, duloxetine, hyoscyamine, levocetirizine, lisinopril-hydrochlorothiazide, methocarbamol, pantoprazole, rizatriptan,  and topiramate.  Pharmacotherapy (Medications Ordered): No orders of the defined types were placed in this encounter.  Orders:  No orders of the defined types were placed in this encounter.  Follow-up plan:   No follow-ups on file.     Interventional Therapies  Risk  Complexity Considerations:   Estimated body mass index is 41.2 kg/m as calculated from the following:   Height as of this encounter: 5' 4"  (1.626 m).   Weight as of this encounter: 240 lb (108.9 kg). Latex allergy    Planned  Pending:   Diagnostic right hip x-rays and MRI (06/13/2021) Diagnostic/therapeutic right IA hip joint inj. #1     Under consideration:   Diagnostic/therapeutic right IA hip joint inj. #1  Diagnostic right suprascapular NB #1    Completed:   Palliative bilateral lumbar facet MBB x2 (11/16/2020) (90/90/80/85-90)  Therapeutic left lumbar facet MBB x2 (10/10/2016) (100/100/100/100)  Therapeutic right lumbar facet MBB x1 (08/31/2015) (n/a)  Therapeutic right lumbar facet RFA x3 (05/08/2021) (100/100/50/50) on the right side Therapeutic left lumbar facet RFA x3 (03/22/2021) (100/100/99/99) on left side Palliative left C7-T1 CESI x1 (04/13/2015) (n/a)    Therapeutic  Palliative (PRN) options:   Palliative lumbar facet MBB  Palliative lumbar facet RFA  Palliative C7-T1 CESI  PRN SCS programming adjustments (2018 replacement by Dr. Clydell Hakim)    Recent Visits Date Type Provider Dept  07/31/21 Office Visit Milinda Pointer, MD [facility]  07/17/21 Procedure visit Milinda Pointer, MD [facility]  06/13/21 Office Visit Milinda Pointer, MD [facility]  Showing recent visits within past 90 days and meeting all other requirements Today's Visits Date Type Provider Dept  09/03/21 Appointment Milinda Pointer, MD [facility]  Showing today's visits and meeting all other requirements Future Appointments No visits were found meeting these conditions. Showing future appointments within next 90 days and meeting all other requirements  I discussed the assessment and treatment plan with the patient. The patient was provided an opportunity to ask questions and all were answered. The patient agreed with the plan and demonstrated an understanding of the instructions.  Patient advised to call back or seek an in-person evaluation if the symptoms or condition worsens.  Duration of encounter: *** minutes.  Total time on encounter, as per AMA guidelines included both the face-to-face and non-face-to-face time personally spent by the physician and/or other qualified  health care professional(s) on the day of the encounter (includes time in activities that require the physician or other qualified health care professional and does not include time in activities normally performed by clinical staff). Physician's time may include the following activities when performed: preparing to see the patient (eg, review of tests, pre-charting review of records) obtaining and/or reviewing separately obtained history performing a medically appropriate examination and/or evaluation counseling and educating the patient/family/caregiver ordering medications, tests, or procedures referring and communicating with other health care professionals (when not separately reported) documenting clinical information in the electronic or other health record independently interpreting results (not separately reported) and communicating results to the patient/ family/caregiver care coordination (not separately reported)  Note by: Gaspar Cola, MD Date: 09/03/2021; Time: 5:36 AM

## 2021-09-03 ENCOUNTER — Encounter: Payer: 59 | Admitting: Pain Medicine

## 2021-09-03 NOTE — Progress Notes (Unsigned)
   Established Patient Office Visit  Subjective   Patient ID: Mary Cruz, female   DOB: July 13, 1975 Age: 46 y.o. MRN: [id number]   No chief complaint on file.   HPI [place] 46 year old female presenting today for weight check.  Approximately 6 weeks ago, we attempted to get [facility] approved for her.  Unfortunately, her insurance company has denied this and payment out-of-pocket is not feasible.  ROS    Objective:    There were no vitals filed for this visit.  Physical Exam   No results found for this or any previous visit (from the past 24 hour(s)).   {Labs (Optional):23779}  The 10-year ASCVD risk score (Arnett DK, et al., 2019) is: 0.7%   Values used to calculate the score:     Age: 77 years     Sex: Female     Is Non-Hispanic African American: No     Diabetic: No     Tobacco smoker: No     Systolic Blood Pressure: 594 mmHg     Is BP treated: Yes     HDL Cholesterol: 59 mg/dL     Total Cholesterol: 217 mg/dL   Assessment & Plan:   No problem-specific Assessment & Plan notes found for this encounter.   No follow-ups on file.  ___________________________________________ Clearnce Sorrel, DNP, APRN, FNP-BC [facility] [place]

## 2021-09-04 ENCOUNTER — Ambulatory Visit: Payer: 59 | Admitting: Medical-Surgical

## 2021-09-04 ENCOUNTER — Encounter: Payer: Self-pay | Admitting: Medical-Surgical

## 2021-09-04 VITALS — BP 139/86 | HR 98 | Ht 65.0 in | Wt 266.0 lb

## 2021-09-04 DIAGNOSIS — D1801 Hemangioma of skin and subcutaneous tissue: Secondary | ICD-10-CM | POA: Diagnosis not present

## 2021-09-04 DIAGNOSIS — I10 Essential (primary) hypertension: Secondary | ICD-10-CM | POA: Diagnosis not present

## 2021-09-04 DIAGNOSIS — Z7689 Persons encountering health services in other specified circumstances: Secondary | ICD-10-CM | POA: Diagnosis not present

## 2021-09-04 DIAGNOSIS — R300 Dysuria: Secondary | ICD-10-CM | POA: Diagnosis not present

## 2021-09-04 LAB — POCT URINALYSIS DIP (CLINITEK)
Bilirubin, UA: NEGATIVE
Blood, UA: NEGATIVE
Glucose, UA: NEGATIVE mg/dL
Ketones, POC UA: NEGATIVE mg/dL
Leukocytes, UA: NEGATIVE
Nitrite, UA: NEGATIVE
POC PROTEIN,UA: NEGATIVE
Spec Grav, UA: 1.025 (ref 1.010–1.025)
Urobilinogen, UA: 0.2 E.U./dL
pH, UA: 7 (ref 5.0–8.0)

## 2021-09-04 MED ORDER — TOPIRAMATE 50 MG PO TABS: 50.0000 mg | ORAL_TABLET | Freq: Two times a day (BID) | ORAL | 1 refills | Status: DC

## 2021-10-01 NOTE — Progress Notes (Deleted)
PROVIDER NOTE: Information contained herein reflects review and annotations entered in association with encounter. Interpretation of such information and data should be left to medically-trained personnel. Information provided to patient can be located elsewhere in the medical record under "Patient Instructions". Document created using STT-dictation technology, any transcriptional errors that may result from process are unintentional.    Patient: Mary Cruz  Service Category: E/M  Provider: Gaspar Cola, MD  DOB: [birth year]  DOS: 10/03/2021  Referring Provider: Samuel Bouche, NP  MRN: [id number]  Specialty: Interventional Pain Management  PCP: Samuel Bouche, NP  Type: Established Patient  Setting: Ambulatory outpatient    Location: Office  Delivery: Face-to-face     HPI  [name], a 46 y.o. year old female, is here today because of her No primary diagnosis found.. Mary Cruz primary complain today is No chief complaint on file. Last encounter: My last encounter with her was on 07/31/2021. Pertinent problems: Mary Cruz has Chronic pain syndrome; Chronic musculoskeletal pain; Osteoarthrosis; Presence of functional implant ([employer] Lumbar spinal cord stimulator implant); Chronic low back pain (Bilateral) (R>L) w/o sciatica; Chronic lumbar radicular pain (Right L5 dermatome; Left S1 Dermatome) (Bilateral) (R>L); Chronic neck pain (1ry area of Pain) (Bilateral) (L>R); Chronic cervical radicular pain (C7 Dermatome) (Bilateral) (L>R); Chronic lower extremity pain (Bilateral) (R>L); Lumbar spondylosis; Lumbar facet syndrome (Bilateral) (R>L); Neurogenic pain; Fibromyalgia; Pain in right knee; Chronic upper extremity pain (2ry area of Pain) (Bilateral) (L>R); Chronic hip pain (3ry area of Pain) (Bilateral) (R>L); Closed fracture of scapula, sequela (Right); DDD (degenerative disc disease), cervical; Cervicalgia; Abnormal MRI, shoulder (Right); Spondylosis without myelopathy or  radiculopathy, lumbosacral region; DDD (degenerative disc disease), lumbar; Osteoarthritis involving multiple joints; Chronic low back pain (Right) w/ sciatica (Right); Chronic lower extremity pain (Right); Lumbosacral radiculopathy at L5 (Right); Dropfoot (Right); Lumbar facet arthropathy; Intractable low back pain; Closed fracture of multiple ribs of right side; Chronic feet pain (Bilateral); Migraines; Chronic hip pain (Right); Osteoarthritis of hip (Right); Grade 1 Retrolisthesis of L4/L5; Spondylolisthesis at L4-L5 level; and Lumbosacral radiculopathy/radiculitis at L4 (Right) on their pertinent problem list. Pain Assessment: Severity of   is reported as a  /10. Location:    / . Onset:  . Quality:  . Timing:  . Modifying factor(s):  [name] Vitals:  vitals were not taken for this visit.   Reason for encounter:  *** . ***  Pharmacotherapy Assessment  Analgesic: Oxycodone IR 5 mg, 1 tab PO q 6 hrs (20 mg/day of oxycodone) MME/day: 30 mg/day.   Monitoring: [place] PMP: PDMP reviewed during this encounter.       Pharmacotherapy: No side-effects or adverse reactions reported. Compliance: No problems identified. Effectiveness: Clinically acceptable.  No notes on file  No results found for: "CBDTHCR" No results found for: "D8THCCBX" No results found for: "D9THCCBX"  UDS:  Summary  Date Value Ref Range Status  07/31/2020 Note  Final    Comment:    ==================================================================== [employer] [facility] (MW) ==================================================================== Test                             Result       Flag       Units  Drug Present and Declared for Prescription Verification   Oxycodone                      1766         EXPECTED   ng/mg creat  Oxymorphone                    1326         EXPECTED   ng/mg creat   Noroxycodone                   2915         EXPECTED   ng/mg creat   Noroxymorphone                 492          EXPECTED   ng/mg  creat    Sources of oxycodone are scheduled prescription medications.    Oxymorphone, noroxycodone, and noroxymorphone are expected    metabolites of oxycodone. Oxymorphone is also available as a    scheduled prescription medication.  Drug Absent but Declared for Prescription Verification   Clonazepam                     Not Detected UNEXPECTED ng/mg creat ==================================================================== Test                      Result    Flag   Units      Ref Range   Creatinine              205              mg/dL      >=20 ==================================================================== Declared Medications:  The flagging and interpretation on this report are based on the  following declared medications.  Unexpected results may arise from  inaccuracies in the declared medications.   **Note: The testing scope of this panel includes these medications:   Clonazepam (Klonopin)  Oxycodone (Roxicodone)   **Note: The testing scope of this panel does not include the  following reported medications:   Amitriptyline (Elavil)  Hydrochlorothiazide (Zestoretic)  Hyoscyamine  Lisinopril (Zestoretic)  Methocarbamol (Robaxin)  Omeprazole (Prilosec)  Rizatriptan (Maxalt) ==================================================================== For clinical consultation, please call [phone]. ====================================================================       ROS  Constitutional: Denies any fever or chills Gastrointestinal: No reported hemesis, hematochezia, vomiting, or acute GI distress Musculoskeletal: Denies any acute onset joint swelling, redness, loss of ROM, or weakness Neurological: No reported episodes of acute onset apraxia, aphasia, dysarthria, agnosia, amnesia, paralysis, loss of coordination, or loss of consciousness  Medication Review  DULoxetine, Probiotic Product, amitriptyline, hyoscyamine, levocetirizine, lisinopril-hydrochlorothiazide,  methocarbamol, oxyCODONE, pantoprazole, rizatriptan, and topiramate  History Review  Allergy: Mary Cruz is allergic to celecoxib, doxycycline, gabapentin, latex, nabumetone, pentazocine lactate, seroquel [quetiapine], amoxicillin, bactrim [sulfamethoxazole-trimethoprim], cephalexin, erythromycin, hydromorphone hcl, and lyrica [pregabalin]. Drug: Mary Cruz  reports no history of drug use. Alcohol:  reports no history of alcohol use. Tobacco:  reports that she quit smoking about 16 months ago. Her smoking use included cigarettes. She quit smokeless tobacco use about 3 years ago. Social: Mary Cruz  reports that she quit smoking about 16 months ago. Her smoking use included cigarettes. She quit smokeless tobacco use about 3 years ago. She reports that she does not drink alcohol and does not use drugs. Medical:  has a past medical history of Abscess of axillary fold (10/10/2013), Acute bronchitis with asthma with acute exacerbation (01/23/2015), Dehydration (04/19/2019), Elevated lactic acid level (04/19/2019), Encounter for management of implanted device (12/19/2014), Encounter for therapeutic drug level monitoring (12/19/2014), Fibromyalgia, GERD (gastroesophageal reflux disease), Hyperglycemia (04/19/2019), Hypertension, Implant pain at battery site (right buttocks area) (04/05/2015), KIDNEY STONES (07/02/2007), Long term  current use of opiate analgesic (12/19/2014), Morbid obesity ([place]) (12/19/2014), Muscle cramping (12/19/2014), Opiate use (30 MME/Day) (12/19/2014), Osteoarthritis, Pain, Right knee pain (01/10/2015), Sepsis ([place]) (04/19/2019), Sepsis affecting skin (10/10/2013), and Tobacco abuse (04/19/2019). Surgical: Mary Cruz  has a past surgical history that includes spinal cord stimulator; Occipital nerve stimulator insertion; Cholecystectomy (2000); Knee surgery; Shoulder surgery; Tubal ligation (2000); Endometrial ablation; Spinal cord stimulator implant (2008); Knee surgery (Right,  02/2015); Breast surgery; Spinal cord stimulator insertion (N/A, 06/21/2016); Shoulder arthroscopy with distal clavicle resection (Right, 10/29/2018); Subacromial decompression (Right, 10/29/2018); laparoscopic appendectomy (Right, 01/01/2020); Appendectomy; Ankle surgery; Colonoscopy with esophagogastroduodenoscopy (egd); Colonoscopy (N/A, 02/20/2021); and Esophagogastroduodenoscopy (N/A, 02/20/2021). Family: family history includes Asthma in her mother; Depression in her mother; Fibromyalgia in an other family member; Stroke in an other family member; Thyroid disease in an other family member.  Laboratory Chemistry Profile   Renal Lab Results  Component Value Date   BUN 17 07/23/2021   CREATININE 1.08 (H) 07/23/2021   LABCREA 51 11/20/2020   BCR 16 07/23/2021   GFRAA 84 08/03/2020   GFRNONAA 34 (L) 01/13/2021    Hepatic Lab Results  Component Value Date   AST 12 07/23/2021   ALT 17 07/23/2021   ALBUMIN 3.8 01/13/2021   ALKPHOS 96 01/13/2021   LIPASE 20 01/13/2021    Electrolytes Lab Results  Component Value Date   NA 136 07/23/2021   K 4.1 07/23/2021   CL 100 07/23/2021   CALCIUM 9.5 07/23/2021   MG 2.0 04/20/2019   PHOS 3.2 04/20/2019    Bone Lab Results  Component Value Date   VD25OH 21 (L) 07/23/2021    Inflammation (CRP: Acute Phase) (ESR: Chronic Phase) Lab Results  Component Value Date   CRP 1.0 (H) 04/05/2015   ESRSEDRATE 28 (H) 11/20/2020   LATICACIDVEN 1.8 04/20/2019         Note: Above Lab results reviewed.  Recent Imaging Review  DG PAIN CLINIC C-ARM 1-60 MIN NO REPORT Fluoro was used, but no Radiologist interpretation will be provided.  Please refer to "NOTES" tab for provider progress note. Note: Reviewed        Physical Exam  General appearance: Well nourished, well developed, and well hydrated. In no apparent acute distress Mental status: Alert, oriented x 3 (person, place, & time)       Respiratory: No evidence of acute respiratory  distress Eyes: PERLA Vitals: There were no vitals taken for this visit. BMI: Estimated body mass index is 44.26 kg/m as calculated from the following:   Height as of 09/04/21: 5' 5"  (1.651 m).   Weight as of 09/04/21: 266 lb (120.7 kg). Ideal: Patient weight not recorded  Assessment   Diagnosis Status  No diagnosis found. Controlled Controlled Controlled   Updated Problems: No problems updated.  Plan of Care  Problem-specific:  No problem-specific Assessment & Plan notes found for this encounter.  Mary Cruz has a current medication list which includes the following long-term medication(s): amitriptyline, duloxetine, hyoscyamine, levocetirizine, lisinopril-hydrochlorothiazide, methocarbamol, pantoprazole, rizatriptan, and topiramate.  Pharmacotherapy (Medications Ordered): No orders of the defined types were placed in this encounter.  Orders:  No orders of the defined types were placed in this encounter.  Follow-up plan:   No follow-ups on file.     Interventional Therapies  Risk  Complexity Considerations:   Estimated body mass index is 41.2 kg/m as calculated from the following:   Height as of this encounter: 5' 4"  (1.626 m).   Weight as of this  encounter: 240 lb (108.9 kg). Latex allergy    Planned  Pending:   Diagnostic right hip x-rays and MRI (06/13/2021) Diagnostic/therapeutic right IA hip joint inj. #1    Under consideration:   Diagnostic/therapeutic right IA hip joint inj. #1  Diagnostic right suprascapular NB #1    Completed:   Palliative bilateral lumbar facet MBB x2 (11/16/2020) (90/90/80/85-90)  Therapeutic left lumbar facet MBB x2 (10/10/2016) (100/100/100/100)  Therapeutic right lumbar facet MBB x1 (08/31/2015) (n/a)  Therapeutic right lumbar facet RFA x3 (05/08/2021) (100/100/50/50) on the right side Therapeutic left lumbar facet RFA x3 (03/22/2021) (100/100/99/99) on left side Palliative left C7-T1 CESI x1 (04/13/2015) (n/a)     Therapeutic  Palliative (PRN) options:   Palliative lumbar facet MBB  Palliative lumbar facet RFA  Palliative C7-T1 CESI  PRN SCS programming adjustments (2018 replacement by Dr. Clydell Hakim)     Recent Visits Date Type Provider Dept  07/31/21 Office Visit Milinda Pointer, MD [facility]  07/17/21 Procedure visit Milinda Pointer, MD [facility]  Showing recent visits within past 90 days and meeting all other requirements Future Appointments Date Type Provider Dept  10/03/21 Appointment Milinda Pointer, MD [facility]  Showing future appointments within next 90 days and meeting all other requirements  I discussed the assessment and treatment plan with the patient. The patient was provided an opportunity to ask questions and all were answered. The patient agreed with the plan and demonstrated an understanding of the instructions.  Patient advised to call back or seek an in-person evaluation if the symptoms or condition worsens.  Duration of encounter: *** minutes.  Total time on encounter, as per AMA guidelines included both the face-to-face and non-face-to-face time personally spent by the physician and/or other qualified health care professional(s) on the day of the encounter (includes time in activities that require the physician or other qualified health care professional and does not include time in activities normally performed by clinical staff). Physician's time may include the following activities when performed: preparing to see the patient (eg, review of tests, pre-charting review of records) obtaining and/or reviewing separately obtained history performing a medically appropriate examination and/or evaluation counseling and educating the patient/family/caregiver ordering medications, tests, or procedures referring and communicating with other health care professionals (when not separately reported) documenting clinical information in  the electronic or other health record independently interpreting results (not separately reported) and communicating results to the patient/ family/caregiver care coordination (not separately reported)  Note by: Gaspar Cola, MD Date: 10/03/2021; Time: 5:54 PM

## 2021-10-03 ENCOUNTER — Encounter: Payer: Self-pay | Admitting: Pain Medicine

## 2021-10-03 NOTE — Progress Notes (Unsigned)
Virtual Visit via Video Note  I connected with Mary Cruz on 10/04/21 at 11:10 AM EDT by a video enabled telemedicine application and verified that I am speaking with the correct person using two identifiers.   I discussed the limitations of evaluation and management by telemedicine and the availability of in person appointments. The patient expressed understanding and agreed to proceed.  Patient location: home Provider locations: office  Subjective:    CC: covid positive  HPI: Pleasant 46 year old female presenting via MyChart video visit with reports of testing positive for COVID.  Her symptoms started on 8/11-12 with URI symptoms.  She tested for COVID on 8/17 with positive results.  She has been managing at home with Mucinex, Sudafed, and Tylenol Cold/sinus.  She has also been getting extra rest and pushing fluids.  Today, she presents with cough productive of small amounts of thick yellowish-green sputum, chills, subjective fevers, headache, burning eyes, sinus congestion, fatigue, poor appetite, and intermittent dizziness.  Also notes dental pain with bending over and maxillary/frontal facial discomfort  Past medical history, Surgical history, Family history not pertinant except as noted below, Social history, Allergies, and medications have been entered into the medical record, reviewed, and corrections made.   Review of Systems: See HPI for pertinent positives and negatives.   Objective:    General: Speaking clearly in complete sentences without any shortness of breath.  Alert and oriented x3.  Normal judgment. No apparent acute distress.  Impression and Recommendations:    1. Positive self-administered antigen test for COVID-19 Unfortunately, she has outside of the window of using an oral antiviral therapy for COVID-19.  Recommend conservative management of symptoms.   2. Acute bacterial sinusitis Multiple drug allergies/intolerances.  Start azithromycin 500 mg today  with 250 mg daily for the next 4 days.  Adding Tessalon Perles for cough.  Recommend using plain Mucinex twice daily and avoiding excessive decongestants as this can cause rebound congestion.  Continue to rest as needed and push p.o. fluids.  I discussed the assessment and treatment plan with the patient. The patient was provided an opportunity to ask questions and all were answered. The patient agreed with the plan and demonstrated an understanding of the instructions.   The patient was advised to call back or seek an in-person evaluation if the symptoms worsen or if the condition fails to improve as anticipated.  25 minutes of non-face-to-face time was provided during this encounter.  Return if symptoms worsen or fail to improve.  Clearnce Sorrel, DNP, APRN, FNP-BC [facility]

## 2021-10-04 ENCOUNTER — Telehealth (INDEPENDENT_AMBULATORY_CARE_PROVIDER_SITE_OTHER): Payer: 59 | Admitting: Medical-Surgical

## 2021-10-04 ENCOUNTER — Encounter: Payer: Self-pay | Admitting: Medical-Surgical

## 2021-10-04 DIAGNOSIS — J019 Acute sinusitis, unspecified: Secondary | ICD-10-CM | POA: Diagnosis not present

## 2021-10-04 DIAGNOSIS — B9689 Other specified bacterial agents as the cause of diseases classified elsewhere: Secondary | ICD-10-CM | POA: Diagnosis not present

## 2021-10-04 DIAGNOSIS — U071 COVID-19: Secondary | ICD-10-CM | POA: Diagnosis not present

## 2021-10-04 MED ORDER — METHYLPREDNISOLONE 4 MG PO TBPK
ORAL_TABLET | ORAL | 0 refills | Status: DC
Start: 2021-10-04 — End: 2021-10-22

## 2021-10-04 MED ORDER — BENZONATATE 200 MG PO CAPS: 200.0000 mg | ORAL_CAPSULE | Freq: Three times a day (TID) | ORAL | 0 refills | Status: DC | PRN

## 2021-10-04 MED ORDER — AZITHROMYCIN 250 MG PO TABS
ORAL_TABLET | ORAL | 0 refills | Status: AC
Start: 2021-10-04 — End: 2021-10-09

## 2021-10-13 ENCOUNTER — Emergency Department (HOSPITAL_COMMUNITY): Payer: 59

## 2021-10-13 ENCOUNTER — Emergency Department (HOSPITAL_COMMUNITY)
Admission: EM | Admit: 2021-10-13 | Discharge: 2021-10-13 | Disposition: A | Discharge status: Home / Self Care | Payer: 59 | Attending: Emergency Medicine | Admitting: Emergency Medicine

## 2021-10-13 ENCOUNTER — Other Ambulatory Visit: Payer: Self-pay

## 2021-10-13 ENCOUNTER — Encounter (HOSPITAL_COMMUNITY): Payer: Self-pay | Admitting: Pharmacy Technician

## 2021-10-13 DIAGNOSIS — R110 Nausea: Secondary | ICD-10-CM | POA: Insufficient documentation

## 2021-10-13 DIAGNOSIS — I959 Hypotension, unspecified: Secondary | ICD-10-CM | POA: Diagnosis not present

## 2021-10-13 DIAGNOSIS — Z91040 Latex allergy status: Secondary | ICD-10-CM | POA: Diagnosis not present

## 2021-10-13 DIAGNOSIS — R197 Diarrhea, unspecified: Secondary | ICD-10-CM | POA: Insufficient documentation

## 2021-10-13 DIAGNOSIS — R109 Unspecified abdominal pain: Secondary | ICD-10-CM | POA: Diagnosis present

## 2021-10-13 DIAGNOSIS — Z79899 Other long term (current) drug therapy: Secondary | ICD-10-CM | POA: Diagnosis not present

## 2021-10-13 DIAGNOSIS — R1084 Generalized abdominal pain: Secondary | ICD-10-CM | POA: Insufficient documentation

## 2021-10-13 LAB — CBC WITH DIFFERENTIAL/PLATELET
Abs Immature Granulocytes: 0.09 10*3/uL — ABNORMAL HIGH (ref 0.00–0.07)
Basophils Absolute: 0 10*3/uL (ref 0.0–0.1)
Basophils Relative: 0 %
Eosinophils Absolute: 0.1 10*3/uL (ref 0.0–0.5)
Eosinophils Relative: 1 %
HCT: 38.9 % (ref 36.0–46.0)
Hemoglobin: 12.3 g/dL (ref 12.0–15.0)
Immature Granulocytes: 1 %
Lymphocytes Relative: 14 %
Lymphs Abs: 2.2 10*3/uL (ref 0.7–4.0)
MCH: 29.1 pg (ref 26.0–34.0)
MCHC: 31.6 g/dL (ref 30.0–36.0)
MCV: 92.2 fL (ref 80.0–100.0)
Monocytes Absolute: 1.7 10*3/uL — ABNORMAL HIGH (ref 0.1–1.0)
Monocytes Relative: 10 %
Neutro Abs: 11.9 10*3/uL — ABNORMAL HIGH (ref 1.7–7.7)
Neutrophils Relative %: 74 %
Platelets: 317 10*3/uL (ref 150–400)
RBC: 4.22 MIL/uL (ref 3.87–5.11)
RDW: 14.8 % (ref 11.5–15.5)
WBC: 16 10*3/uL — ABNORMAL HIGH (ref 4.0–10.5)
nRBC: 0 % (ref 0.0–0.2)

## 2021-10-13 LAB — COMPREHENSIVE METABOLIC PANEL
ALT: 19 U/L (ref 0–44)
AST: 16 U/L (ref 15–41)
Albumin: 3.4 g/dL — ABNORMAL LOW (ref 3.5–5.0)
Alkaline Phosphatase: 88 U/L (ref 38–126)
Anion gap: 8 (ref 5–15)
BUN: 21 mg/dL — ABNORMAL HIGH (ref 6–20)
CO2: 25 mmol/L (ref 22–32)
Calcium: 9.1 mg/dL (ref 8.9–10.3)
Chloride: 108 mmol/L (ref 98–111)
Creatinine, Ser: 1.59 mg/dL — ABNORMAL HIGH (ref 0.44–1.00)
GFR, Estimated: 41 mL/min — ABNORMAL LOW (ref >60)
Glucose, Bld: 105 mg/dL — ABNORMAL HIGH (ref 70–99)
Potassium: 3.5 mmol/L (ref 3.5–5.1)
Sodium: 141 mmol/L (ref 135–145)
Total Bilirubin: 0.7 mg/dL (ref 0.3–1.2)
Total Protein: 7.3 g/dL (ref 6.5–8.1)

## 2021-10-13 LAB — URINALYSIS, ROUTINE W REFLEX MICROSCOPIC
Bacteria, UA: NONE SEEN
Bilirubin Urine: NEGATIVE
Glucose, UA: NEGATIVE mg/dL
Hgb urine dipstick: NEGATIVE
Ketones, ur: NEGATIVE mg/dL
Leukocytes,Ua: NEGATIVE
Nitrite: NEGATIVE
Protein, ur: NEGATIVE mg/dL
Specific Gravity, Urine: 1.043 — ABNORMAL HIGH (ref 1.005–1.030)
pH: 5 (ref 5.0–8.0)

## 2021-10-13 LAB — LACTIC ACID, PLASMA: Lactic Acid, Venous: 1.8 mmol/L (ref 0.5–1.9)

## 2021-10-13 LAB — LIPASE, BLOOD: Lipase: 24 U/L (ref 11–51)

## 2021-10-13 MED ORDER — SODIUM CHLORIDE (PF) 0.9 % IJ SOLN
INTRAMUSCULAR | Status: AC
  Filled 2021-10-13: qty 50

## 2021-10-13 MED ORDER — FENTANYL CITRATE PF 50 MCG/ML IJ SOSY
50.0000 ug | PREFILLED_SYRINGE | Freq: Once | INTRAMUSCULAR | Status: AC
  Administered 2021-10-13: 50 ug via INTRAVENOUS
  Filled 2021-10-13: qty 1

## 2021-10-13 MED ORDER — SODIUM CHLORIDE 0.9 % IV BOLUS
1000.0000 mL | Freq: Once | INTRAVENOUS | Status: AC
  Administered 2021-10-13: 1000 mL via INTRAVENOUS

## 2021-10-13 MED ORDER — IOHEXOL 300 MG/ML  SOLN
80.0000 mL | Freq: Once | INTRAMUSCULAR | Status: AC | PRN
  Administered 2021-10-13: 80 mL via INTRAVENOUS

## 2021-10-13 MED ORDER — ONDANSETRON HCL 4 MG/2ML IJ SOLN
4.0000 mg | Freq: Once | INTRAMUSCULAR | Status: AC
  Administered 2021-10-13: 4 mg via INTRAVENOUS
  Filled 2021-10-13: qty 2

## 2021-10-13 MED ORDER — IOHEXOL 300 MG/ML  SOLN: 100.0000 mL | Freq: Once | INTRAMUSCULAR | Status: DC | PRN

## 2021-10-13 NOTE — ED Provider Notes (Signed)
[facility] DEPT Provider Note   CSN: [id number] Arrival date & time: 10/13/21  1732     History  Chief Complaint  Patient presents with   Abdominal Pain   Hypotension    Mary Cruz is a 46 y.o. female.  With past medical history of hypertension, GERD, fibromyalgia, IBD who presents to the emergency department with abdominal pain and hypotension.  Patient was at [facility] eating.  She states that she felt like she had to urinate.  States that she went to the restroom and then had the urge to have a bowel movement.  She states she then began "pouring sweat."  EMS was called and on their arrival patient was diaphoretic and pale with hypotension to 25E systolic.  She was given 700 mL normal saline and 4 of Zofran.  Patient is stating she is having ongoing abdominal pain.  She describes it as intermittent, cramping and stabbing and severe.  Feels like she has an urge to have a BM still.  Patient is unsure if there was blood in her diarrhea.  She endorses nausea without vomiting.  She denies dysuria.   Abdominal Pain Associated symptoms: diarrhea and nausea   Associated symptoms: no fever and no vomiting        Home Medications Prior to Admission medications   Medication Sig Start Date End Date Taking? Authorizing Provider  amitriptyline (ELAVIL) 100 MG tablet TAKE 1 TABLET BY MOUTH EVERYDAY AT BEDTIME Patient taking differently: Take 100 mg by mouth at bedtime. 07/23/21  Yes Samuel Bouche, NP  benzonatate (TESSALON) 200 MG capsule Take 1 capsule (200 mg total) by mouth 3 (three) times daily as needed for cough. 10/04/21  Yes Samuel Bouche, NP  Biotin 5000 MCG CAPS Take 5,000 mcg by mouth daily.   Yes [provider]  DULoxetine (CYMBALTA) 60 MG capsule Take 1 capsule (60 mg total) by mouth 2 (two) times daily. 07/23/21  Yes Samuel Bouche, NP  hyoscyamine (LEVSIN SL) 0.125 MG SL tablet Place 1 tablet (0.125 mg total) under the tongue daily as needed.  07/23/21  Yes Samuel Bouche, NP  levocetirizine (XYZAL) 5 MG tablet Take 1 tablet (5 mg total) by mouth every evening. 07/23/21  Yes Samuel Bouche, NP  lisinopril-hydrochlorothiazide (ZESTORETIC) 10-12.5 MG tablet Take 1 tablet by mouth daily. 07/23/21  Yes Samuel Bouche, NP  methocarbamol (ROBAXIN) 750 MG tablet Take 1 tablet (750 mg total) by mouth every 8 (eight) hours as needed for muscle spasms. 07/23/21  Yes Samuel Bouche, NP  OxyCODONE HCl, Abuse Deter, (OXAYDO) 5 MG TABA Take 5 mg by mouth every 8 (eight) hours as needed (pain).   Yes [provider]  pantoprazole (PROTONIX) 40 MG tablet Take 1 tablet (40 mg total) by mouth daily. 07/23/21  Yes Samuel Bouche, NP  Probiotic Product (ALIGN PO) Take 1 tablet by mouth daily.   Yes [provider]  rizatriptan (MAXALT) 10 MG tablet Take 1 tablet (10 mg total) by mouth as needed for migraine. May repeat in 2 hours if needed Patient taking differently: Take 10 mg by mouth daily as needed for migraine. May repeat in 2 hours if needed 07/23/21  Yes Samuel Bouche, NP  topiramate (TOPAMAX) 50 MG tablet Take 1 tablet (50 mg total) by mouth 2 (two) times daily. Patient taking differently: Take 50 mg by mouth daily. 09/04/21  Yes Samuel Bouche, NP  methylPREDNISolone (MEDROL DOSEPAK) 4 MG TBPK tablet 6-day pack as directed Patient not taking: Reported on 10/13/2021  10/04/21   Samuel Bouche, NP      Allergies    Celecoxib, Doxycycline, Gabapentin, Latex, Nabumetone, Pentazocine lactate, Seroquel [quetiapine], Amoxicillin, Bactrim [sulfamethoxazole-trimethoprim], Cephalexin, Erythromycin, Hydromorphone hcl, and Lyrica [pregabalin]    Review of Systems   Review of Systems  Constitutional:  Positive for diaphoresis. Negative for fever.  Gastrointestinal:  Positive for abdominal pain, diarrhea and nausea. Negative for vomiting.  All other systems reviewed and are negative.   Physical Exam Updated Vital Signs BP 107/68   Pulse 87   Temp 97.7 F (36.5 C)  (Oral)   Resp 15   SpO2 98%  Physical Exam Vitals and nursing note reviewed.  Constitutional:      General: She is not in acute distress.    Appearance: Normal appearance. She is well-developed. She is obese. She is ill-appearing. She is not toxic-appearing.  HENT:     Head: Normocephalic and atraumatic.     Mouth/Throat:     Mouth: Mucous membranes are moist.     Pharynx: Oropharynx is clear.  Eyes:     General: No scleral icterus.    Extraocular Movements: Extraocular movements intact.     Pupils: Pupils are equal, round, and reactive to light.  Cardiovascular:     Rate and Rhythm: Normal rate and regular rhythm.     Heart sounds: Normal heart sounds. No murmur heard. Pulmonary:     Effort: Pulmonary effort is normal. No respiratory distress.     Breath sounds: Normal breath sounds.  Abdominal:     General: Abdomen is protuberant. Bowel sounds are decreased. There is no distension.     Palpations: Abdomen is soft.     Tenderness: There is generalized abdominal tenderness and tenderness in the periumbilical area. There is no guarding.  Musculoskeletal:        General: Normal range of motion.     Cervical back: Neck supple.  Skin:    General: Skin is warm and dry.     Capillary Refill: Capillary refill takes less than 2 seconds.  Neurological:     General: No focal deficit present.     Mental Status: She is alert and oriented to person, place, and time. Mental status is at baseline.  Psychiatric:        Mood and Affect: Mood normal.        Behavior: Behavior normal.        Thought Content: Thought content normal.        Judgment: Judgment normal.    ED Results / Procedures / Treatments   Labs (all labs ordered are listed, but only abnormal results are displayed) Labs Reviewed  COMPREHENSIVE METABOLIC PANEL - Abnormal; Notable for the following components:      Result Value   Glucose, Bld 105 (*)    BUN 21 (*)    Creatinine, Ser 1.59 (*)    Albumin 3.4 (*)    GFR,  Estimated 41 (*)    All other components within normal limits  CBC WITH DIFFERENTIAL/PLATELET - Abnormal; Notable for the following components:   WBC 16.0 (*)    Neutro Abs 11.9 (*)    Monocytes Absolute 1.7 (*)    Abs Immature Granulocytes 0.09 (*)    All other components within normal limits  URINALYSIS, ROUTINE W REFLEX MICROSCOPIC - Abnormal; Notable for the following components:   Specific Gravity, Urine 1.043 (*)    All other components within normal limits  LACTIC ACID, PLASMA  LIPASE, BLOOD  I-STAT BETA HCG BLOOD,  ED ([facility], [facility], [facility] ONLY)    EKG None  Radiology CT Abdomen Pelvis W Contrast  Result Date: 10/13/2021 CLINICAL DATA:  Abdominal pain, acute, nonlocalized EXAM: CT ABDOMEN AND PELVIS WITH CONTRAST TECHNIQUE: Multidetector CT imaging of the abdomen and pelvis was performed using the standard protocol following bolus administration of intravenous contrast. RADIATION DOSE REDUCTION: This exam was performed according to the departmental dose-optimization program which includes automated exposure control, adjustment of the mA and/or kV according to patient size and/or use of iterative reconstruction technique. CONTRAST:  69m OMNIPAQUE IOHEXOL 300 MG/ML  SOLN COMPARISON:  CT abdomen pelvis 01/13/2021 FINDINGS: Lower chest: No acute abnormality.  Tiny hiatal hernia. Hepatobiliary: Subcentimeter hypodensity too small to characterize (2:21). No focal liver abnormality. Status post cholecystectomy. No biliary dilatation. Pancreas: No focal lesion. Normal pancreatic contour. No surrounding inflammatory changes. No main pancreatic ductal dilatation. Spleen: Normal in size without focal abnormality. Adrenals/Urinary Tract: No adrenal nodule bilaterally. Bilateral kidneys enhance symmetrically. No hydronephrosis. No hydroureter. The urinary bladder is unremarkable. Stomach/Bowel: Stomach is within normal limits. No evidence of bowel wall thickening or dilatation. Colonic diverticulosis. Status  post appendectomy. Vascular/Lymphatic: No abdominal aorta or iliac aneurysm. No abdominal, pelvic, or inguinal lymphadenopathy. Reproductive: Uterus and bilateral adnexa are unremarkable. Other: No intraperitoneal free fluid. No intraperitoneal free gas. No organized fluid collection. Musculoskeletal: Trace free fluid within the soft tissues along the right hip and gluteal soft tissues (2:57). No abdominal wall hernia or abnormality. Neural stimulator along the lower back with leads entering the posterior central canal at the T11-T12 level and tips partially visualized terminating along posterior central canal. No suspicious lytic or blastic osseous lesions. No acute displaced fracture. Multilevel degenerative changes of the spine. IMPRESSION: 1. Colonic diverticulosis with no acute diverticulitis. 2. Tiny hiatal hernia. Electronically Signed   By: MIven FinnM.D.   On: 10/13/2021 19:56    Procedures Procedures   Medications Ordered in ED Medications  sodium chloride 0.9 % bolus 1,000 mL (0 mLs Intravenous Stopped 10/13/21 2055)  ondansetron (ZOFRAN) injection 4 mg (4 mg Intravenous Given 10/13/21 1825)  fentaNYL (SUBLIMAZE) injection 50 mcg (50 mcg Intravenous Given 10/13/21 1900)  iohexol (OMNIPAQUE) 300 MG/ML solution 80 mL (80 mLs Intravenous Contrast Given 10/13/21 1930)    ED Course/ Medical Decision Making/ A&P                           Medical Decision Making Amount and/or Complexity of Data Reviewed Labs: ordered. Radiology: ordered.  Risk Prescription drug management.  This patient presents to the ED with chief complaint(s) of abdominal pain with pertinent past medical history of IBD which further complicates the presenting complaint. The complaint involves an extensive differential diagnosis and also carries with it a high risk of complications and morbidity.    The differential diagnosis includes Acute hepatobiliary disease, pancreatitis, appendicitis, PUD, gastritis, SBO,  diverticulitis, colitis, viral gastroenteritis, Crohn's, UC, vascular catastrophe, UTI, pyelonephritis, renal stone, obstructed stone, infected stone, ovarian torsion, ectopic pregnancy, TOA, PID, STD, etc.    Additional history obtained: Additional history obtained from spouse Records reviewed Care Everywhere/External Records and Primary Care Documents GI physician note  ED Course and Reassessment: 46year old female who presents to the emergency department with abdominal pain and hypotension. On initial evaluation, she appears to have moderate abdominal pain that waxes and wanes. She is slightly hypotensive but mentating well. Abdomen is tender about the periumbilical region. Initiated IVF resuscitation, and zofran initially. Held off  on pain control until BP improves. Labs drawn and CT a/p ordered.  On reassessment her BP is improving and fentanyl 89mg was ordered with improvement in her symptoms.  Labs with WBC 16 concerning for possible infection. Mildly elevated Cr which is likely 2/2 dehydration. Lipase is negative, doubt acute pancreatitis.  UA is clean doubt UTI, pyelonephritis, no renal symptoms concerning for stone, obstructed stone, infected stone. Lactic is negative, reassuring for shock.  CT has no acute findings. Given this, doubt acute appendicitis, acute hepatobiliary disease, mesenteric ischemia, SBO, perforation. Doubt gastritis, PUD. Doubt ectopic. No vaginal discharge concerning for PID or TOA.  She does have a history of IBD. Suspect this is likely cause of her symptoms. CT without evidence of colitis requiring antibiotics.  She continues to feel improved after fluids, zofran, fentanyl. The attending, Dr. AZenia Residesalso evaluated the patient and agrees with plan of care. As for her hypotension and report of diaphoresis and paleness, likely a vagal episode. Her BP continues to improve, she had no syncope. No repeated episodes here in the ED.  She feels comfortable going home  at this time. Will send referral to new GI as insurance has changed and she has no follow-up or maintenance medication for IBD.   Given strict return precautions. She verbalizes understanding.  Independent labs interpretation:  The following labs were independently interpreted:  WBC 16  Cr mildly elevated to 1.59 Lactic 1.8 Lipase 24 UA negative  Independent visualization of imaging: - I independently visualized the following imaging with scope of interpretation limited to determining acute life threatening conditions related to emergency care: CT a/p with contrast, which revealed no acute findings  Consultation: - Consulted or discussed management/test interpretation w/ external professional: not indicated  Consideration for admission or further workup: could consider further work up for source of abdominal pain but has GI follow-up, clinically stable Social Determinants of health: none identified  Final Clinical Impression(s) / ED Diagnoses Final diagnoses:  Generalized abdominal pain    Rx / DC Orders ED Discharge Orders          Ordered    Ambulatory referral to Gastroenterology        10/13/21 2114              AMickie Hillier PA-C 10/14/21 1306    ALacretia Leigh MD 10/14/21 1635

## 2021-10-13 NOTE — ED Provider Notes (Signed)
I provided a substantive portion of the care of this patient.  I personally performed the entirety of the medical decision making for this encounter.      46 year old female with history of IBS and likely Crohn's disease presents with abdominal discomfort that began after eating today.  Work-up here shows moderate leukocytosis.  Abdominal CT primary interpretation showed no new findings.  Mild AKI appreciated treated with IV fluids.  Patient will be given referral to GI on-call   Lacretia Leigh, MD 10/13/21 2109

## 2021-10-13 NOTE — ED Triage Notes (Signed)
Pt bib ems from [facility] where pt was using the bathroom and became pale, diaphoretic and weak. Pt complains of diffuse abdominal pain with hx of chrohns. Pt states this feels like previous chrons flare up. Hypotensive on scene in the 63'S systolic. Given 700cc NS and 57m Zofran with improvement in BP to the 1937'Dsystolic. Pt alert and oriented. Bilateral 18g in place PTA.

## 2021-10-13 NOTE — Discharge Instructions (Signed)
You were seen in the emergency department today for abdominal pain.  Your imaging is normal.  You may have had a vagal episode while you are using the restroom which causes your blood pressure and heart rate to go down as well as become sweaty and pale.  We gave you some IV fluids.  Please drink plenty of fluids and you get home and change positions carefully.  I have referred you to a gastroenterologist.  I have also provided their information in your discharge paperwork.  If you do not hear a call in 3 days please call them to schedule a follow-up appointment.  Please return to the emergency department if you have severely worsening abdominal pain.

## 2021-10-17 ENCOUNTER — Telehealth: Payer: Self-pay | Admitting: General Practice

## 2021-10-17 NOTE — Telephone Encounter (Signed)
Transition Care Management Follow-up Telephone Call Date of discharge and from where: 10/13/21 from [facility] How have you been since you were released from the hospital? Doing pretty good. She denies any pain, nausea or vomiting. Any questions or concerns? No  Items Reviewed: Did the pt receive and understand the discharge instructions provided? Yes  Medications obtained and verified? No  Other? No  Any new allergies since your discharge? No  Dietary orders reviewed? Yes Do you have support at home? Yes   Home Care and Equipment/Supplies: Were home health services ordered? no  Functional Questionnaire: (I = Independent and D = Dependent) ADLs: I  Bathing/Dressing- I  Meal Prep- I  Eating- I  Maintaining continence- I  Transferring/Ambulation- I  Managing Meds- I  Follow up appointments reviewed:  PCP Hospital f/u appt confirmed? Yes  Scheduled to see Samuel Bouche, Np on 10/18/21. [facility] f/u appt confirmed? No   Are transportation arrangements needed? No  If their condition worsens, is the pt aware to call PCP or go to the Emergency Dept.? Yes Was the patient provided with contact information for the PCP's office or ED? Yes Was to pt encouraged to call back with questions or concerns? Yes

## 2021-10-18 ENCOUNTER — Ambulatory Visit: Payer: 59 | Admitting: Medical-Surgical

## 2021-10-19 ENCOUNTER — Other Ambulatory Visit: Payer: Self-pay | Admitting: Medical-Surgical

## 2021-10-19 ENCOUNTER — Ambulatory Visit (HOSPITAL_BASED_OUTPATIENT_CLINIC_OR_DEPARTMENT_OTHER)
Admission: RE | Admit: 2021-10-19 | Discharge: 2021-10-19 | Disposition: A | Discharge status: Home / Self Care | Payer: 59 | Source: Ambulatory Visit | Attending: Medical-Surgical | Admitting: Medical-Surgical

## 2021-10-19 DIAGNOSIS — R42 Dizziness and giddiness: Secondary | ICD-10-CM | POA: Diagnosis present

## 2021-10-19 DIAGNOSIS — R55 Syncope and collapse: Secondary | ICD-10-CM | POA: Insufficient documentation

## 2021-10-19 LAB — ECHOCARDIOGRAM COMPLETE
AR max vel: 2.09 cm2
AV Area VTI: 2.16 cm2
AV Area mean vel: 2.05 cm2
AV Mean grad: 7 mmHg
AV Peak grad: 11.3 mmHg
Ao pk vel: 1.68 m/s
Area-P 1/2: 4.21 cm2
P 1/2 time: 866 msec
S' Lateral: 2.6 cm

## 2021-10-21 NOTE — Patient Instructions (Signed)
____________________________________________________________________________________________  Pharmacy Shortages of Pain Medication   Introduction Shockingly as it may seem, .  "No U.[place] Supreme Court decision has ever interpreted the Constitution as guaranteeing a right to health care for all Americans." - https://huff.com/  "With respect to human rights, the [place] has no formally codified right to health, nor does it participate in a human rights treaty that specifies a right to health." - Scott J. Schweikart, JD, MBE  Situation By now, most of our patients have had the experience of being told by their pharmacist that they do not have enough medication to cover their prescription. If you have not had this experience, just know that you soon will.  Problem There appears to be a shortage of these medications, either at the national level or locally. This is happening with all pharmacies. When there is not enough medication, patients are offered a partial fill and they are told that they will try to get the rest of the medicine for them at a later time. If they do not have enough for even a partial fill, the pharmacists are telling the patients to call us (the prescribing physicians) to request that we send another prescription to another pharmacy to get the medicine.   This reordering of a controlled substance creates documentation problems where additional paperwork needs to be created to explain why two prescriptions for the same period of time and the same medicine are being prescribed to the same patient. It also creates situations where the last appointment note does not accurately reflect when and what prescriptions were given to a patient. This leads to prescribing errors down the line, in subsequent follow-up visits.   [employer] of Pharmacy ([employer]) Research revealed that Surveyor, quantity .1806 (21  NCAC 46.1806) authorizes pharmacists to the transfer of prescriptions among pharmacies, and it sets forth procedural and recordkeeping requirements for doing so. However, this requires the pharmacist to complete the previously mentioned procedural paperwork to accomplish the transfer. As it turns out, it is much easier for them to have the prescribing physicians do the work.   Possible solutions 1. You can ask your physician to assist you in weaning yourself off these medications. 2. Ask your pharmacy if the medication is in stock, 3 days prior to your refill. 3. If you need a pharmacy change, let us know at your medication management visit. Prescriptions that have already been electronically sent to a pharmacy will not be re-sent to a different pharmacy if your pharmacy of record does not have it in stock. Proper stocking of medication is a pharmacy problem, not a prescriber problem. Work with your pharmacist to solve the problem. 4. Have the [facility] Assembly add a provision to the "STOP ACT" (the law that mandates how controlled substances are prescribed) where there is an exception to the electronic prescribing rule that states that in the event there are shortages of medications the physicians are allowed to use written prescriptions as opposed to electronic ones. This would allow patients to take their prescriptions to a different pharmacy that may have enough medication available to fill the prescription. The problem is that currently there is a law that does not allow for written prescriptions, with the exception of instances where the electronic medical record is down due to technical issues.  5. Have [place] Congress ease the pressure on pharmaceutical companies, allowing them to produce enough quantities of the medication to adequately supply the population. 6. Have pharmacies keep enough  stocks of these medications to cover their client base.  7. Have the [facility] Assembly add  a provision to the "STOP ACT" where they ease the regulations surrounding the transfer of controlled substances between pharmacies, so as to simplify the transfer of supplies. As an alternative, develop a system to allow patients to obtain the remainder of their prescription at another one of their pharmacies or at an associate pharmacy.   How this shortage will affect you.  Understand that this is a pharmacy supply problem, not a prescriber problem. Work with your pharmacy to solve it. The job of the prescriber is to evaluate and monitor the patient for the appropriate indications and use of these medicines. It is not the job of the prescriber to supply the medication or to solve problems with that supply. The responsibility and the choice to obtain the medication resides on the patient. By law, supplying the medication is the job of the pharmacy. It is certainly not the job of the prescriber to solve supply problems.   Due to the above problems we are no longer taking patients to write for their pain medication. Future discussions with your physician may include potentially weaning medications or transitioning to alternatives.  We will be focusing primarily on interventional based pain management. We will continue to evaluate for appropriate indications and we may provide recommendations regarding medication, dose, and schedule, as well as monitoring recommendations, however, we will not be taking over the actual prescribing of these substances. On those patients where we are treating their chronic pain with interventional therapies, exceptions will be considered on a case by case basis. At this time, we will try to continue providing this supplemental service to those patients we have been managing in the past. However, as of August 1st, 2023, we no longer will be sending additional prescriptions to other pharmacies for the purpose of solving their supply problems. Once we send a prescription to a pharmacy,  we will not be resending it again to another pharmacy to cover for their shortages.   What to do. Write as many letters as you can. Recruit the help of family members in writing these letters. Below are some of the places where you can write to make your voice heard. Let them know what the problem is and push them to look for solutions.   Search internet for: "[employer] find your legislators" NoseSwap.is  Search internet for: "[employer] commissioner complaints" Starlas.fi  Search internet for: "[place] complaints" https://www.hernandez-brewer.com/.htm  Search internet for: "[facility] pharmacy complaints" Email [facility] Customer Relations woondaal.com.jsp?callType=store  Search internet for: Programme researcher, broadcasting/film/video customer service complaints" https://www.[facility].com/topic/marketing/contactus/contactus_customerservice.jsp  ____________________________________________________________________________________________  ____________________________________________________________________________________________  Medication Rules  Purpose: To inform patients, and their family members, of our rules and regulations.  Applies to: All patients receiving prescriptions (written or electronic).  Pharmacy of record: Pharmacy where electronic prescriptions will be sent. If written prescriptions are taken to a different pharmacy, please inform the nursing staff. The pharmacy listed in the electronic medical record should be the one where you would like electronic prescriptions to be sent.  Electronic prescriptions: In compliance with the [place] (STOP) Act of 2017 (Session Lanny Cramp [phone]), effective February 11, 2018, all controlled substances must be electronically prescribed. Calling prescriptions  to the pharmacy will cease to exist.  Prescription refills: Only during scheduled appointments. Applies to all prescriptions.  NOTE: The following applies primarily to controlled substances (Opioid* Pain Medications).  Type of encounter (visit): For patients receiving controlled substances, face-to-face visits are required. (Not an option or up to the patient.)  Patient's responsibilities: Pain Pills: Bring all pain pills to every appointment (except for procedure appointments). Pill Bottles: Bring pills in original pharmacy bottle. Always bring the newest bottle. Bring bottle, even if empty. Medication refills: You are responsible for knowing and keeping track of what medications you take and those you need refilled. The day before your appointment: write a list of all prescriptions that need to be refilled. The day of the appointment: give the list to the admitting nurse. Prescriptions will be written only during appointments. No prescriptions will be written on procedure days. If you forget a medication: it will not be "Called in", "Faxed", or "electronically sent". You will need to get another appointment to get these prescribed. No early refills. Do not call asking to have your prescription filled early. Prescription Accuracy: You are responsible for carefully inspecting your prescriptions before leaving our office. Have the discharge nurse carefully go over each prescription with you, before taking them home. Make sure that your name is accurately spelled, that your address is correct. Check the name and dose of your medication to make sure it is accurate. Check the number of pills, and the written instructions to make sure they are clear and accurate. Make sure that you are given enough medication to last until your next medication refill appointment. Taking Medication: Take medication as prescribed. When it comes to controlled substances, taking less pills or less frequently than prescribed  is permitted and encouraged. Never take more pills than instructed. Never take medication more frequently than prescribed.  Inform other Doctors: Always inform, all of your healthcare providers, of all the medications you take. Pain Medication from other Providers: You are not allowed to accept any additional pain medication from any other Doctor or Healthcare provider. There are two exceptions to this rule. (see below) In the event that you require additional pain medication, you are responsible for notifying us, as stated below. Cough Medicine: Often these contain an opioid, such as codeine or hydrocodone. Never accept or take cough medicine containing these opioids if you are already taking an opioid* medication. The combination may cause respiratory failure and death. Medication Agreement: You are responsible for carefully reading and following our Medication Agreement. This must be signed before receiving any prescriptions from our practice. Safely store a copy of your signed Agreement. Violations to the Agreement will result in no further prescriptions. (Additional copies of our Medication Agreement are available upon request.) Laws, Rules, & Regulations: All patients are expected to follow all Federal and [employer], [employer], Rules, [employer]. Ignorance of the Laws does not constitute a valid excuse.  Illegal drugs and Controlled Substances: The use of illegal substances (including, but not limited to marijuana and its derivatives) and/or the illegal use of any controlled substances is strictly prohibited. Violation of this rule may result in the immediate and permanent discontinuation of any and all prescriptions being written by our practice. The use of any illegal substances is prohibited. Adopted CDC guidelines & recommendations: Target dosing levels will be at or below 60 MME/day. Use of benzodiazepines** is not recommended.  Exceptions: There are only two exceptions to the rule of not  receiving pain medications from other Healthcare Providers. Exception #1 (Emergencies): In the event of an emergency (i.e.: accident requiring emergency care), you are allowed to receive additional pain medication. However, you are responsible for: As soon as  you are able, call our office [phone], at any time of the day or night, and leave a message stating your name, the date and nature of the emergency, and the name and dose of the medication prescribed. In the event that your call is answered by a member of our staff, make sure to document and save the date, time, and the name of the person that took your information.  Exception #2 (Planned Surgery): In the event that you are scheduled by another doctor or dentist to have any type of surgery or procedure, you are allowed (for a period no longer than 30 days), to receive additional pain medication, for the acute post-op pain. However, in this case, you are responsible for picking up a copy of our "Post-op Pain Management for Surgeons" handout, and giving it to your surgeon or dentist. This document is available at our office, and does not require an appointment to obtain it. Simply go to our office during business hours (Monday-Thursday from 8:00 AM to 4:00 PM) (Friday 8:00 AM to 12:00 Noon) or if you have a scheduled appointment with [place], prior to your surgery, and ask for it by name. In addition, you are responsible for: calling our office [phone], at any time of the day or night, and leaving a message stating your name, name of your surgeon, type of surgery, and date of procedure or surgery. Failure to comply with your responsibilities may result in termination of therapy involving the controlled substances. Medication Agreement Violation. Following the above rules, including your responsibilities will help you in avoiding a Medication Agreement Violation ("Breaking your Pain Medication Contract").  *Opioid medications include: morphine,  codeine, oxycodone, oxymorphone, hydrocodone, hydromorphone, meperidine, tramadol, tapentadol, buprenorphine, fentanyl, methadone. **Benzodiazepine medications include: diazepam (Valium), alprazolam (Xanax), clonazepam (Klonopine), lorazepam (Ativan), clorazepate (Tranxene), chlordiazepoxide (Librium), estazolam (Prosom), oxazepam (Serax), temazepam (Restoril), triazolam (Halcion) (Last updated: 11/08/2020) ____________________________________________________________________________________________  ____________________________________________________________________________________________  Medication Recommendations and Reminders  Applies to: All patients receiving prescriptions (written and/or electronic).  Medication Rules & Regulations: These rules and regulations exist for your safety and that of others. They are not flexible and neither are we. Dismissing or ignoring them will be considered "non-compliance" with medication therapy, resulting in complete and irreversible termination of such therapy. (See document titled "Medication Rules" for more details.) In all conscience, because of safety reasons, we cannot continue providing a therapy where the patient does not follow instructions.  Pharmacy of record:  Definition: This is the pharmacy where your electronic prescriptions will be sent.  We do not endorse any particular pharmacy, however, we have experienced problems with [facility] not securing enough medication supply for the community. We do not restrict you in your choice of pharmacy. However, once we write for your prescriptions, we will NOT be re-sending more prescriptions to fix restricted supply problems created by your pharmacy, or your insurance.  The pharmacy listed in the electronic medical record should be the one where you want electronic prescriptions to be sent. If you choose to change pharmacy, simply notify our nursing staff.  Recommendations: Keep all of your pain  medications in a safe place, under lock and key, even if you live alone. We will NOT replace lost, stolen, or damaged medication. After you fill your prescription, take 1 week's worth of pills and put them away in a safe place. You should keep a separate, properly labeled bottle for this purpose. The remainder should be kept in the original bottle. Use this as your primary supply, until  it runs out. Once it's gone, then you know that you have 1 week's worth of medicine, and it is time to come in for a prescription refill. If you do this correctly, it is unlikely that you will ever run out of medicine. To make sure that the above recommendation works, it is very important that you make sure your medication refill appointments are scheduled at least 1 week before you run out of medicine. To do this in an effective manner, make sure that you do not leave the office without scheduling your next medication management appointment. Always ask the nursing staff to show you in your prescription , when your medication will be running out. Then arrange for the receptionist to get you a return appointment, at least 7 days before you run out of medicine. Do not wait until you have 1 or 2 pills left, to come in. This is very poor planning and does not take into consideration that we may need to cancel appointments due to bad weather, sickness, or emergencies affecting our staff. DO NOT ACCEPT A "Partial Fill": If for any reason your pharmacy does not have enough pills/tablets to completely fill or refill your prescription, do not allow for a "partial fill". The law allows the pharmacy to complete that prescription within 72 hours, without requiring a new prescription. If they do not fill the rest of your prescription within those 72 hours, you will need a separate prescription to fill the remaining amount, which we will NOT provide. If the reason for the partial fill is your insurance, you will need to talk to the pharmacist  about payment alternatives for the remaining tablets, but again, DO NOT ACCEPT A PARTIAL FILL, unless you can trust your pharmacist to obtain the remainder of the pills within 72 hours.  Prescription refills and/or changes in medication(s):  Prescription refills, and/or changes in dose or medication, will be conducted only during scheduled medication management appointments. (Applies to both, written and electronic prescriptions.) No refills on procedure days. No medication will be changed or started on procedure days. No changes, adjustments, and/or refills will be conducted on a procedure day. Doing so will interfere with the diagnostic portion of the procedure. No phone refills. No medications will be "called into the pharmacy". No Fax refills. No weekend refills. No Holliday refills. No after hours refills.  Remember:  Business hours are:  Monday to Thursday 8:00 AM to 4:00 PM Provider's Schedule: Milinda Pointer, MD - Appointments are:  Medication management: Monday and Wednesday 8:00 AM to 4:00 PM Procedure day: Tuesday and Thursday 7:30 AM to 4:00 PM Gillis Santa, MD - Appointments are:  Medication management: Tuesday and Thursday 8:00 AM to 4:00 PM Procedure day: Monday and Wednesday 7:30 AM to 4:00 PM (Last update: 09/01/2019) ____________________________________________________________________________________________  ____________________________________________________________________________________________  CBD (cannabidiol) & Delta-8 (Delta-8 tetrahydrocannabinol) WARNING  Intro: Cannabidiol (CBD) and tetrahydrocannabinol (THC), are two natural compounds found in plants of the Cannabis genus. They can both be extracted from hemp or cannabis. Hemp and cannabis come from the Cannabis sativa plant. Both compounds interact with your body's endocannabinoid system, but they have very different effects. CBD does not produce the high sensation associated with cannabis. Delta-8  tetrahydrocannabinol, also known as delta-8 THC, is a psychoactive substance found in the Cannabis sativa plant, of which marijuana and hemp are two varieties. THC is responsible for the high associated with the illicit use of marijuana.  Applicable to: All individuals currently taking or considering taking CBD (cannabidiol) and,  more important, all patients taking opioid analgesic controlled substances (pain medication). (Example: oxycodone; oxymorphone; hydrocodone; hydromorphone; morphine; methadone; tramadol; tapentadol; fentanyl; buprenorphine; butorphanol; dextromethorphan; meperidine; codeine; etc.)  Legal status: CBD remains a Schedule I drug prohibited for any use. CBD is illegal with one exception. In the [place], CBD has a limited Transport planner (FDA) approval for the treatment of two specific types of epilepsy disorders. Only one CBD product has been approved by the FDA for this purpose: "Epidiolex". FDA is aware that some companies are marketing products containing cannabis and cannabis-derived compounds in ways that violate the [employer], Drug and Cosmetic Act [place] Act) and that may put the health and safety of consumers at risk. The FDA, a Federal agency, has not enforced the CBD status since 2018. UPDATE: (03/30/2021) The Drug Enforcement Agency ([place]) issued a letter stating that "delta" cannabinoids, including Delta-8-THCO and Delta-9-THCO, synthetically derived from hemp do not qualify as hemp and will be viewed as Schedule I drugs. (Schedule I drugs, substances, or chemicals are defined as drugs with no currently accepted medical use and a high potential for abuse. Some examples of Schedule I drugs are: heroin, lysergic acid diethylamide (LSD), marijuana (cannabis), 3,4-methylenedioxymethamphetamine (ecstasy), methaqualone, and peyote.) (https://jennings.com/)  Legality: Some manufacturers ship CBD products nationally, which is illegal. Often such products are sold  online and are therefore available throughout the country. CBD is openly sold in head shops and health food stores in some states where such sales have not been explicitly legalized. Selling unapproved products with unsubstantiated therapeutic claims is not only a violation of the law, but also can put patients at risk, as these products have not been proven to be safe or effective. Federal illegality makes it difficult to conduct research on CBD.  Reference: "FDA Regulation of Cannabis and Cannabis-Derived Products, Including Cannabidiol (CBD)" - SeekArtists.com.pt  Warning: CBD is not FDA approved and has not undergo the same manufacturing controls as prescription drugs.  This means that the purity and safety of available CBD may be questionable. Most of the time, despite manufacturer's claims, it is contaminated with THC (delta-9-tetrahydrocannabinol - the chemical in marijuana responsible for the "HIGH").  When this is the case, the [facility] contaminant will trigger a positive urine drug screen (UDS) test for Marijuana (carboxy-THC). Because a positive UDS for any illicit substance is a violation of our medication agreement, your opioid analgesics (pain medicine) may be permanently discontinued. The FDA recently put out a warning about 5 things that everyone should be aware of regarding Delta-8 THC: Delta-8 THC products have not been evaluated or approved by the FDA for safe use and may be marketed in ways that put the public health at risk. The FDA has received adverse event reports involving delta-8 THC-containing products. Delta-8 THC has psychoactive and intoxicating effects. Delta-8 THC manufacturing often involve use of potentially harmful chemicals to create the concentrations of delta-8 THC claimed in the marketplace. The final delta-8 THC product may have potentially harmful by-products  (contaminants) due to the chemicals used in the process. Manufacturing of delta-8 THC products may occur in uncontrolled or unsanitary settings, which may lead to the presence of unsafe contaminants or other potentially harmful substances. Delta-8 THC products should be kept out of the reach of children and pets.  MORE ABOUT CBD  General Information: CBD was discovered in [birth year] and it is a derivative of the cannabis sativa genus plants (Marijuana and Hemp). It is one of the 113 identified substances found in Marijuana.  It accounts for up to 40% of the plant's extract. As of 2018, preliminary clinical studies on CBD included research for the treatment of anxiety, movement disorders, and pain. CBD is available and consumed in multiple forms, including inhalation of smoke or vapor, as an aerosol spray, and by mouth. It may be supplied as an oil containing CBD, capsules, dried cannabis, or as a liquid solution. CBD is thought not to be as psychoactive as THC (delta-9-tetrahydrocannabinol - the chemical in marijuana responsible for the "HIGH"). Studies suggest that CBD may interact with different biological target receptors in the body, including cannabinoid and other neurotransmitter receptors. As of 2018 the mechanism of action for its biological effects has not been determined.  Side-effects  Adverse reactions: Dry mouth, diarrhea, decreased appetite, fatigue, drowsiness, malaise, weakness, sleep disturbances, and others.  Drug interactions: CBC may interact with other medications such as blood-thinners. Because CBD causes drowsiness on its own, it also increases the drowsiness caused by other medications, including antihistamines (such as Benadryl), benzodiazepines (Xanax, Ativan, Valium), antipsychotics, antidepressants and opioids, as well as alcohol and supplements such as kava, melatonin and St. John's Wort. Be cautious with the following combinations:   Brivaracetam (Briviact) Brivaracetam is changed  and broken down by the body. CBD might decrease how quickly the body breaks down brivaracetam. This might increase levels of brivaracetam in the body.  Caffeine Caffeine is changed and broken down by the body. CBD might decrease how quickly the body breaks down caffeine. This might increase levels of caffeine in the body.  Carbamazepine (Tegretol) Carbamazepine is changed and broken down by the body. CBD might decrease how quickly the body breaks down carbamazepine. This might increase levels of carbamazepine in the body and increase its side effects.  Citalopram (Celexa) Citalopram is changed and broken down by the body. CBD might decrease how quickly the body breaks down citalopram. This might increase levels of citalopram in the body and increase its side effects.  Clobazam (Onfi) Clobazam is changed and broken down by the liver. CBD might decrease how quickly the liver breaks down clobazam. This might increase the effects and side effects of clobazam.  Eslicarbazepine (Aptiom) Eslicarbazepine is changed and broken down by the body. CBD might decrease how quickly the body breaks down eslicarbazepine. This might increase levels of eslicarbazepine in the body by a small amount.  Everolimus (Zostress) Everolimus is changed and broken down by the body. CBD might decrease how quickly the body breaks down everolimus. This might increase levels of everolimus in the body.  Lithium Taking higher doses of CBD might increase levels of lithium. This can increase the risk of lithium toxicity.  Medications changed by the liver (Cytochrome P450 1A1 (CYP1A1) substrates) Some medications are changed and broken down by the liver. CBD might change how quickly the liver breaks down these medications. This could change the effects and side effects of these medications.  Medications changed by the liver (Cytochrome P450 1A2 (CYP1A2) substrates) Some medications are changed and broken down by the liver. CBD  might change how quickly the liver breaks down these medications. This could change the effects and side effects of these medications.  Medications changed by the liver (Cytochrome P450 1B1 (CYP1B1) substrates) Some medications are changed and broken down by the liver. CBD might change how quickly the liver breaks down these medications. This could change the effects and side effects of these medications.  Medications changed by the liver (Cytochrome P450 2A6 (CYP2A6) substrates) Some medications are changed and  broken down by the liver. CBD might change how quickly the liver breaks down these medications. This could change the effects and side effects of these medications.  Medications changed by the liver (Cytochrome P450 2B6 (CYP2B6) substrates) Some medications are changed and broken down by the liver. CBD might change how quickly the liver breaks down these medications. This could change the effects and side effects of these medications.  Medications changed by the liver (Cytochrome P450 2C19 (CYP2C19) substrates) Some medications are changed and broken down by the liver. CBD might change how quickly the liver breaks down these medications. This could change the effects and side effects of these medications.  Medications changed by the liver (Cytochrome P450 2C8 (CYP2C8) substrates) Some medications are changed and broken down by the liver. CBD might change how quickly the liver breaks down these medications. This could change the effects and side effects of these medications.  Medications changed by the liver (Cytochrome P450 2C9 (CYP2C9) substrates) Some medications are changed and broken down by the liver. CBD might change how quickly the liver breaks down these medications. This could change the effects and side effects of these medications.  Medications changed by the liver (Cytochrome P450 2D6 (CYP2D6) substrates) Some medications are changed and broken down by the liver. CBD might  change how quickly the liver breaks down these medications. This could change the effects and side effects of these medications.  Medications changed by the liver (Cytochrome P450 2E1 (CYP2E1) substrates) Some medications are changed and broken down by the liver. CBD might change how quickly the liver breaks down these medications. This could change the effects and side effects of these medications.  Medications changed by the liver (Cytochrome P450 3A4 (CYP3A4) substrates) Some medications are changed and broken down by the liver. CBD might change how quickly the liver breaks down these medications. This could change the effects and side effects of these medications.  Medications changed by the liver (Glucuronidated drugs) Some medications are changed and broken down by the liver. CBD might change how quickly the liver breaks down these medications. This could change the effects and side effects of these medications.  Medications that decrease the breakdown of other medications by the liver (Cytochrome P450 2C19 (CYP2C19) inhibitors) CBD is changed and broken down by the liver. Some drugs decrease how quickly the liver changes and breaks down CBD. This could change the effects and side effects of CBD.  Medications that decrease the breakdown of other medications in the liver (Cytochrome P450 3A4 (CYP3A4) inhibitors) CBD is changed and broken down by the liver. Some drugs decrease how quickly the liver changes and breaks down CBD. This could change the effects and side effects of CBD.  Medications that increase breakdown of other medications by the liver (Cytochrome P450 3A4 (CYP3A4) inducers) CBD is changed and broken down by the liver. Some drugs increase how quickly the liver changes and breaks down CBD. This could change the effects and side effects of CBD.  Medications that increase the breakdown of other medications by the liver (Cytochrome P450 2C19 (CYP2C19) inducers) CBD is changed and  broken down by the liver. Some drugs increase how quickly the liver changes and breaks down CBD. This could change the effects and side effects of CBD.  Methadone (Dolophine) Methadone is broken down by the liver. CBD might decrease how quickly the liver breaks down methadone. Taking cannabidiol along with methadone might increase the effects and side effects of methadone.  Rufinamide (Banzel) Rufinamide is  changed and broken down by the body. CBD might decrease how quickly the body breaks down rufinamide. This might increase levels of rufinamide in the body by a small amount.  Sedative medications (CNS depressants) CBD might cause sleepiness and slowed breathing. Some medications, called sedatives, can also cause sleepiness and slowed breathing. Taking CBD with sedative medications might cause breathing problems and/or too much sleepiness.  Sirolimus (Rapamune) Sirolimus is changed and broken down by the body. CBD might decrease how quickly the body breaks down sirolimus. This might increase levels of sirolimus in the body.  Stiripentol (Diacomit) Stiripentol is changed and broken down by the body. CBD might decrease how quickly the body breaks down stiripentol. This might increase levels of stiripentol in the body and increase its side effects.  Tacrolimus (Prograf) Tacrolimus is changed and broken down by the body. CBD might decrease how quickly the body breaks down tacrolimus. This might increase levels of tacrolimus in the body.  Tamoxifen (Soltamox) Tamoxifen is changed and broken down by the body. CBD might affect how quickly the body breaks down tamoxifen. This might affect levels of tamoxifen in the body.  Topiramate (Topamax) Topiramate is changed and broken down by the body. CBD might decrease how quickly the body breaks down topiramate. This might increase levels of topiramate in the body by a small amount.  Valproate Valproic acid can cause liver injury. Taking cannabidiol  with valproic acid might increase the chance of liver injury. CBD and/or valproic acid might need to be stopped, or the dose might need to be reduced.  Warfarin (Coumadin) CBD might increase levels of warfarin, which can increase the risk for bleeding. CBD and/or warfarin might need to be stopped, or the dose might need to be reduced.  Zonisamide Zonisamide is changed and broken down by the body. CBD might decrease how quickly the body breaks down zonisamide. This might increase levels of zonisamide in the body by a small amount. (Last update: 04/11/2021) ____________________________________________________________________________________________  ____________________________________________________________________________________________  Drug Holidays (Slow)  What is a "Drug Holiday"? Drug Holiday: is the name given to the period of time during which a patient stops taking a medication(s) for the purpose of eliminating tolerance to the drug.  Benefits Improved effectiveness of opioids. Decreased opioid dose needed to achieve benefits. Improved pain with lesser dose.  What is tolerance? Tolerance: is the progressive decreased in effectiveness of a drug due to its repetitive use. With repetitive use, the body gets use to the medication and as a consequence, it loses its effectiveness. This is a common problem seen with opioid pain medications. As a result, a larger dose of the drug is needed to achieve the same effect that used to be obtained with a smaller dose.  How long should a "Drug Holiday" last? You should stay off of the pain medicine for at least 14 consecutive days. (2 weeks)  Should I stop the medicine "cold [place]"? No. You should always coordinate with your Pain Specialist so that he/she can provide you with the correct medication dose to make the transition as smoothly as possible.  How do I stop the medicine? Slowly. You will be instructed to decrease the daily amount of  pills that you take by one (1) pill every seven (7) days. This is called a "slow downward taper" of your dose. For example: if you normally take four (4) pills per day, you will be asked to drop this dose to three (3) pills per day for seven (7) days, then to  two (2) pills per day for seven (7) days, then to one (1) per day for seven (7) days, and at the end of those last seven (7) days, this is when the "Drug Holiday" would start.   Will I have withdrawals? By doing a "slow downward taper" like this one, it is unlikely that you will experience any significant withdrawal symptoms. Typically, what triggers withdrawals is the sudden stop of a high dose opioid therapy. Withdrawals can usually be avoided by slowly decreasing the dose over a prolonged period of time. If you do not follow these instructions and decide to stop your medication abruptly, withdrawals may be possible.  What are withdrawals? Withdrawals: refers to the wide range of symptoms that occur after stopping or dramatically reducing opiate drugs after heavy and prolonged use. Withdrawal symptoms do not occur to patients that use low dose opioids, or those who take the medication sporadically. Contrary to benzodiazepine (example: Valium, Xanax, etc.) or alcohol withdrawals ("Delirium Tremens"), opioid withdrawals are not lethal. Withdrawals are the physical manifestation of the body getting rid of the excess receptors.  Expected Symptoms Early symptoms of withdrawal may include: Agitation Anxiety Muscle aches Increased tearing Insomnia Runny nose Sweating Yawning  Late symptoms of withdrawal may include: Abdominal cramping Diarrhea Dilated pupils Goose bumps Nausea Vomiting  Will I experience withdrawals? Due to the slow nature of the taper, it is very unlikely that you will experience any.  What is a slow taper? Taper: refers to the gradual decrease in dose.  (Last update:  09/01/2019) ____________________________________________________________________________________________

## 2021-10-21 NOTE — Progress Notes (Unsigned)
PROVIDER NOTE: Information contained herein reflects review and annotations entered in association with encounter. Interpretation of such information and data should be left to medically-trained personnel. Information provided to patient can be located elsewhere in the medical record under "Patient Instructions". Document created using STT-dictation technology, any transcriptional errors that may result from process are unintentional.    Patient: Mary Cruz  Service Category: E/M  Provider: Gaspar Cola, MD  DOB: 1975/06/22  DOS: 10/22/2021  Referring Provider: Samuel Bouche, NP  MRN: [id number]  Specialty: Interventional Pain Management  PCP: Samuel Bouche, NP  Type: Established Patient  Setting: Ambulatory outpatient    Location: Office  Delivery: Face-to-face     HPI  [name], a 46 y.o. year old female, is here today because of her Chronic pain syndrome [G89.4]. Mary Cruz primary complain today is No chief complaint on file. Last encounter: My last encounter with her was on 07/31/2021. Pertinent problems: Mary Cruz has Chronic pain syndrome; Chronic musculoskeletal pain; Osteoarthrosis; Presence of functional implant ([employer]tr[employer] Lumbar spinal cord stimulator implant); Chronic low back pain (Bilateral) (R>L) w/o sciatica; Chronic lumbar radicular pain (Right L5 dermatome; Left S1 Dermatome) (Bilateral) (R>L); Chronic neck pain (1ry area of Pain) (Bilateral) (L>R); Chronic cervical radicular pain (C7 Dermatome) (Bilateral) (L>R); Chronic lower extremity pain (Bilateral) (R>L); Lumbar spondylosis; Lumbar facet syndrome (Bilateral) (R>L); Neurogenic pain; Fibromyalgia; Pain in right knee; Chronic upper extremity pain (2ry area of Pain) (Bilateral) (L>R); Chronic hip pain (3ry area of Pain) (Bilateral) (R>L); Closed fracture of scapula, sequela (Right); DDD (degenerative disc disease), cervical; Cervicalgia; Abnormal MRI, shoulder (Right); Spondylosis without myelopathy or  radiculopathy, lumbosacral region; DDD (degenerative disc disease), lumbar; Osteoarthritis involving multiple joints; Chronic low back pain (Right) w/ sciatica (Right); Chronic lower extremity pain (Right); Lumbosacral radiculopathy at L5 (Right); Dropfoot (Right); Lumbar facet arthropathy; Intractable low back pain; Closed fracture of multiple ribs of right side; Chronic feet pain (Bilateral); Migraines; Chronic hip pain (Right); Osteoarthritis of hip (Right); Grade 1 Retrolisthesis of L4/L5; Spondylolisthesis at L4-L5 level; and Lumbosacral radiculopathy/radiculitis at L4 (Right) on their pertinent problem list. Pain Assessment: Severity of   is reported as a  /10. Location:    / . Onset:  . Quality:  . Timing:  . Modifying factor(s):  [name] Vitals:  vitals were not taken for this visit.   Reason for encounter: medication management. ***  Routine UDS ordered today.   RTCB: 01/20/2022 Nonopioids transferred 04/06/2020: Robaxin  Pharmacotherapy Assessment  Analgesic: Oxycodone IR 5 mg, 1 tab PO q 6 hrs (20 mg/day of oxycodone) MME/day: 30 mg/day.   Monitoring: [place] PMP: PDMP reviewed during this encounter.       Pharmacotherapy: No side-effects or adverse reactions reported. Compliance: No problems identified. Effectiveness: Clinically acceptable.  No notes on file  No results found for: "CBDTHCR" No results found for: "D8THCCBX" No results found for: "D9THCCBX"  UDS:  Summary  Date Value Ref Range Status  07/31/2020 Note  Final    Comment:    ==================================================================== [facility] (MW) ==================================================================== Test                             Result       Flag       Units  Drug Present and Declared for Prescription Verification   Oxycodone                      1766  EXPECTED   ng/mg creat   Oxymorphone                    1326         EXPECTED   ng/mg creat   Noroxycodone                    2915         EXPECTED   ng/mg creat   Noroxymorphone                 492          EXPECTED   ng/mg creat    Sources of oxycodone are scheduled prescription medications.    Oxymorphone, noroxycodone, and noroxymorphone are expected    metabolites of oxycodone. Oxymorphone is also available as a    scheduled prescription medication.  Drug Absent but Declared for Prescription Verification   Clonazepam                     Not Detected UNEXPECTED ng/mg creat ==================================================================== Test                      Result    Flag   Units      Ref Range   Creatinine              205              mg/dL      >=20 ==================================================================== Declared Medications:  The flagging and interpretation on this report are based on the  following declared medications.  Unexpected results may arise from  inaccuracies in the declared medications.   **Note: The testing scope of this panel includes these medications:   Clonazepam (Klonopin)  Oxycodone (Roxicodone)   **Note: The testing scope of this panel does not include the  following reported medications:   Amitriptyline (Elavil)  Hydrochlorothiazide (Zestoretic)  Hyoscyamine  Lisinopril (Zestoretic)  Methocarbamol (Robaxin)  Omeprazole (Prilosec)  Rizatriptan (Maxalt) ==================================================================== For clinical consultation, please call [phone]. ====================================================================       ROS  Constitutional: Denies any fever or chills Gastrointestinal: No reported hemesis, hematochezia, vomiting, or acute GI distress Musculoskeletal: Denies any acute onset joint swelling, redness, loss of ROM, or weakness Neurological: No reported episodes of acute onset apraxia, aphasia, dysarthria, agnosia, amnesia, paralysis, loss of coordination, or loss of consciousness  Medication Review   Biotin, DULoxetine, Probiotic Product, amitriptyline, benzonatate, hyoscyamine, levocetirizine, lisinopril-hydrochlorothiazide, methocarbamol, methylPREDNISolone, pantoprazole, rizatriptan, and topiramate  History Review  Allergy: Ms. Flesch is allergic to celecoxib, doxycycline, gabapentin, latex, nabumetone, pentazocine lactate, seroquel [quetiapine], amoxicillin, bactrim [sulfamethoxazole-trimethoprim], cephalexin, erythromycin, hydromorphone hcl, and lyrica [pregabalin]. Drug: Ms. Craine  reports no history of drug use. Alcohol:  reports no history of alcohol use. Tobacco:  reports that she quit smoking about 17 months ago. Her smoking use included cigarettes. She quit smokeless tobacco use about 3 years ago. Social: Ms. Chiang  reports that she quit smoking about 17 months ago. Her smoking use included cigarettes. She quit smokeless tobacco use about 3 years ago. She reports that she does not drink alcohol and does not use drugs. Medical:  has a past medical history of Abscess of axillary fold (10/10/2013), Acute bronchitis with asthma with acute exacerbation (01/23/2015), Dehydration (04/19/2019), Elevated lactic acid level (04/19/2019), Encounter for management of implanted device (12/19/2014), Encounter for therapeutic drug level monitoring (12/19/2014), Fibromyalgia, GERD (gastroesophageal reflux disease), Hyperglycemia (04/19/2019), Hypertension, Implant pain at battery site (  right buttocks area) (04/05/2015), KIDNEY STONES (07/02/2007), Long term current use of opiate analgesic (12/19/2014), Morbid obesity ([place]) (12/19/2014), Muscle cramping (12/19/2014), Opiate use (30 MME/Day) (12/19/2014), Osteoarthritis, Pain, Right knee pain (01/10/2015), Sepsis ([place]) (04/19/2019), Sepsis affecting skin (10/10/2013), and Tobacco abuse (04/19/2019). Surgical: Ms. Ferryman  has a past surgical history that includes spinal cord stimulator; Occipital nerve stimulator insertion; Cholecystectomy (2000); Knee  surgery; Shoulder surgery; Tubal ligation (2000); Endometrial ablation; Spinal cord stimulator implant (2008); Knee surgery (Right, 02/2015); Breast surgery; Spinal cord stimulator insertion (N/A, 06/21/2016); Shoulder arthroscopy with distal clavicle resection (Right, 10/29/2018); Subacromial decompression (Right, 10/29/2018); laparoscopic appendectomy (Right, 01/01/2020); Appendectomy; Ankle surgery; Colonoscopy with esophagogastroduodenoscopy (egd); Colonoscopy (N/A, 02/20/2021); and Esophagogastroduodenoscopy (N/A, 02/20/2021). Family: family history includes Asthma in her mother; Depression in her mother; Fibromyalgia in an other family member; Stroke in an other family member; Thyroid disease in an other family member.  Laboratory Chemistry Profile   Renal Lab Results  Component Value Date   BUN 21 (H) 10/13/2021   CREATININE 1.59 (H) 10/13/2021   LABCREA 51 11/20/2020   BCR 16 07/23/2021   GFRAA 84 08/03/2020   GFRNONAA 41 (L) 10/13/2021    Hepatic Lab Results  Component Value Date   AST 16 10/13/2021   ALT 19 10/13/2021   ALBUMIN 3.4 (L) 10/13/2021   ALKPHOS 88 10/13/2021   LIPASE 24 10/13/2021    Electrolytes Lab Results  Component Value Date   NA 141 10/13/2021   K 3.5 10/13/2021   CL 108 10/13/2021   CALCIUM 9.1 10/13/2021   MG 2.0 04/20/2019   PHOS 3.2 04/20/2019    Bone Lab Results  Component Value Date   VD25OH 21 (L) 07/23/2021    Inflammation (CRP: Acute Phase) (ESR: Chronic Phase) Lab Results  Component Value Date   CRP 1.0 (H) 04/05/2015   ESRSEDRATE 28 (H) 11/20/2020   LATICACIDVEN 1.8 10/13/2021         Note: Above Lab results reviewed.  Recent Imaging Review  ECHOCARDIOGRAM COMPLETE    ECHOCARDIOGRAM REPORT       Patient Name:   SHIRLENE ANDAYA [facility] Date of Exam: 10/19/2021 Medical Rec #:  [id number]         Height:       65.0 in Accession #:    [id number]        Weight:       266.0 lb Date of Birth:  September 22, 1975         BSA:          2.233  m Patient Age:    58 years          BP:           111/77 mmHg Patient Gender: F                 HR:           96 bpm. Exam Location:  [place]  Procedure: 2D Echo, Cardiac Doppler and Color Doppler  Indications:    Syncope   History:        Patient has no prior history of Echocardiogram examinations.                 Near syncope. Dizziness.   Sonographer:    Merrie Roof RDCS Referring Phys: [id number] [place]   1. TDS. Left ventricular ejection fraction, by estimation, is 60 to 65%. The left ventricle has normal function. The left ventricle has no regional wall motion abnormalities. Left ventricular  diastolic parameters were normal.  2. Right ventricular systolic function is normal. The right ventricular size is normal.  3. The mitral valve is normal in structure. No evidence of mitral valve regurgitation. No evidence of mitral stenosis.  4. The aortic valve was not well visualized. Aortic valve regurgitation is trivial. No aortic stenosis is present.  5. The inferior vena cava is normal in size with greater than 50% respiratory variability, suggesting right atrial pressure of 3 mmHg.  FINDINGS  Left Ventricle: TDS. Left ventricular ejection fraction, by estimation, is 60 to 65%. The left ventricle has normal function. The left ventricle has no regional wall motion abnormalities. The left ventricular internal cavity size was normal in size.  There is no left ventricular hypertrophy. Left ventricular diastolic parameters were normal.  Right Ventricle: The right ventricular size is normal. No increase in right ventricular wall thickness. Right ventricular systolic function is normal.  Left Atrium: Left atrial size was normal in size.  Right Atrium: Right atrial size was normal in size.  Pericardium: There is no evidence of pericardial effusion.  Mitral Valve: The mitral valve is normal in structure. No evidence of mitral valve regurgitation. No evidence of mitral  valve stenosis.  Tricuspid Valve: The tricuspid valve is normal in structure. Tricuspid valve regurgitation is not demonstrated. No evidence of tricuspid stenosis.  Aortic Valve: The aortic valve was not well visualized. Aortic valve regurgitation is trivial. Aortic regurgitation PHT measures 866 msec. No aortic stenosis is present. Aortic valve mean gradient measures 7.0 mmHg. Aortic valve peak gradient measures  11.3 mmHg. Aortic valve area, by VTI measures 2.16 cm.  Pulmonic Valve: The pulmonic valve was normal in structure. Pulmonic valve regurgitation is not visualized. No evidence of pulmonic stenosis.  Aorta: The aortic root is normal in size and structure.  Venous: The inferior vena cava is normal in size with greater than 50% respiratory variability, suggesting right atrial pressure of 3 mmHg.  IAS/Shunts: No atrial level shunt detected by color flow Doppler.    LEFT VENTRICLE PLAX 2D LVIDd:         4.10 cm   Diastology LVIDs:         2.60 cm   LV e' medial:    10.30 cm/s LV PW:         1.10 cm   LV E/e' medial:  9.0 LV IVS:        1.10 cm   LV e' lateral:   9.57 cm/s LVOT diam:     2.00 cm   LV E/e' lateral: 9.7 LV SV:         56 LV SV Index:   25 LVOT Area:     3.14 cm    RIGHT VENTRICLE RV Basal diam:  3.60 cm  LEFT ATRIUM             Index        RIGHT ATRIUM           Index LA diam:        3.70 cm 1.66 cm/m   RA Area:     17.60 cm LA Vol (A2C):   36.2 ml 16.21 ml/m  RA Volume:   51.10 ml  22.88 ml/m LA Vol (A4C):   49.5 ml 22.17 ml/m LA Biplane Vol: 45.0 ml 20.15 ml/m  AORTIC VALVE AV Area (Vmax):    2.09 cm AV Area (Vmean):   2.05 cm AV Area (VTI):     2.16 cm AV Vmax:  168.00 cm/s AV Vmean:          127.000 cm/s AV VTI:            0.260 m AV Peak Grad:      11.3 mmHg AV Mean Grad:      7.0 mmHg LVOT Vmax:         112.00 cm/s LVOT Vmean:        82.800 cm/s LVOT VTI:          0.179 m LVOT/AV VTI ratio: 0.69 AI PHT:            866  msec   AORTA Ao Root diam: 3.20 cm Ao Asc diam:  2.50 cm  MITRAL VALVE MV Area (PHT): 4.21 cm    SHUNTS MV Decel Time: 180 msec    Systemic VTI:  0.18 m MV E velocity: 92.40 cm/s  Systemic Diam: 2.00 cm MV A velocity: 79.00 cm/s MV E/A ratio:  1.17  Jenne Campus MD Electronically signed by Jenne Campus MD Signature Date/Time: 10/19/2021/3:59:47 PM      Final   Note: Reviewed        Physical Exam  General appearance: Well nourished, well developed, and well hydrated. In no apparent acute distress Mental status: Alert, oriented x 3 (person, place, & time)       Respiratory: No evidence of acute respiratory distress Eyes: PERLA Vitals: There were no vitals taken for this visit. BMI: Estimated body mass index is 44.26 kg/m as calculated from the following:   Height as of 09/04/21: 5' 5"  (1.651 m).   Weight as of 09/04/21: 266 lb (120.7 kg). Ideal: Patient weight not recorded  Assessment   Diagnosis Status  1. Chronic pain syndrome   2. Chronic neck pain (1ry area of Pain) (Bilateral) (L>R)   3. Lumbar facet syndrome (Bilateral) (R>L)   4. Chronic upper extremity pain (2ry area of Pain) (Bilateral) (L>R)   5. Chronic hip pain (3ry area of Pain) (Bilateral) (R>L)   6. Intractable low back pain   7. Fibromyalgia   8. Presence of functional implant (Medtronic Lumbar spinal cord stimulator implant)   9. Pharmacologic therapy   10. Chronic use of opiate for therapeutic purpose   11. Encounter for medication management   12. Encounter for chronic pain management    Controlled Controlled Controlled   Updated Problems: No problems updated.  Plan of Care  Problem-specific:  No problem-specific Assessment & Plan notes found for this encounter.  Ms. NURAH PETRIDES has a current medication list which includes the following long-term medication(s): amitriptyline, duloxetine, hyoscyamine, levocetirizine, lisinopril-hydrochlorothiazide, methocarbamol, pantoprazole,  rizatriptan, and topiramate.  Pharmacotherapy (Medications Ordered): No orders of the defined types were placed in this encounter.  Orders:  No orders of the defined types were placed in this encounter.  Follow-up plan:   No follow-ups on file.     Interventional Therapies  Risk  Complexity Considerations:   Estimated body mass index is 41.2 kg/m as calculated from the following:   Height as of this encounter: 5' 4"  (1.626 m).   Weight as of this encounter: 240 lb (108.9 kg). Latex allergy    Planned  Pending:   Diagnostic right hip x-rays and MRI (06/13/2021) Diagnostic/therapeutic right IA hip joint inj. #1    Under consideration:   Diagnostic/therapeutic right IA hip joint inj. #1  Diagnostic right suprascapular NB #1    Completed:   Palliative bilateral lumbar facet MBB x2 (11/16/2020) (90/90/80/85-90)  Therapeutic left lumbar facet  MBB x2 (10/10/2016) (100/100/100/100)  Therapeutic right lumbar facet MBB x1 (08/31/2015) (n/a)  Therapeutic right lumbar facet RFA x3 (05/08/2021) (100/100/50/50) on the right side Therapeutic left lumbar facet RFA x3 (03/22/2021) (100/100/99/99) on left side Palliative left C7-T1 CESI x1 (04/13/2015) (n/a)    Therapeutic  Palliative (PRN) options:   Palliative lumbar facet MBB  Palliative lumbar facet RFA  Palliative C7-T1 CESI  PRN SCS programming adjustments (2018 replacement by Dr. Clydell Hakim)      Recent Visits Date Type Provider Dept  07/31/21 Office Visit Milinda Pointer, MD [facility]  Showing recent visits within past 90 days and meeting all other requirements Future Appointments Date Type Provider Dept  10/22/21 Appointment Milinda Pointer, MD [facility]  Showing future appointments within next 90 days and meeting all other requirements  I discussed the assessment and treatment plan with the patient. The patient was provided an opportunity to ask questions and all were answered. The patient  agreed with the plan and demonstrated an understanding of the instructions.  Patient advised to call back or seek an in-person evaluation if the symptoms or condition worsens.  Duration of encounter: *** minutes.  Total time on encounter, as per AMA guidelines included both the face-to-face and non-face-to-face time personally spent by the physician and/or other qualified health care professional(s) on the day of the encounter (includes time in activities that require the physician or other qualified health care professional and does not include time in activities normally performed by clinical staff). Physician's time may include the following activities when performed: preparing to see the patient (eg, review of tests, pre-charting review of records) obtaining and/or reviewing separately obtained history performing a medically appropriate examination and/or evaluation counseling and educating the patient/family/caregiver ordering medications, tests, or procedures referring and communicating with other health care professionals (when not separately reported) documenting clinical information in the electronic or other health record independently interpreting results (not separately reported) and communicating results to the patient/ family/caregiver care coordination (not separately reported)  Note by: Gaspar Cola, MD Date: 10/22/2021; Time: 7:09 PM

## 2021-10-22 ENCOUNTER — Ambulatory Visit: Payer: 59 | Attending: Pain Medicine | Admitting: Pain Medicine

## 2021-10-22 ENCOUNTER — Encounter: Payer: Self-pay | Admitting: Pain Medicine

## 2021-10-22 VITALS — BP 131/72 | HR 110 | Temp 97.2°F | Resp 18 | Ht 64.0 in | Wt 266.0 lb

## 2021-10-22 DIAGNOSIS — M79604 Pain in right leg: Secondary | ICD-10-CM | POA: Diagnosis present

## 2021-10-22 DIAGNOSIS — M4310 Spondylolisthesis, site unspecified: Secondary | ICD-10-CM | POA: Insufficient documentation

## 2021-10-22 DIAGNOSIS — G8929 Other chronic pain: Secondary | ICD-10-CM | POA: Insufficient documentation

## 2021-10-22 DIAGNOSIS — M79605 Pain in left leg: Secondary | ICD-10-CM | POA: Diagnosis present

## 2021-10-22 DIAGNOSIS — M5416 Radiculopathy, lumbar region: Secondary | ICD-10-CM | POA: Insufficient documentation

## 2021-10-22 DIAGNOSIS — M47816 Spondylosis without myelopathy or radiculopathy, lumbar region: Secondary | ICD-10-CM | POA: Insufficient documentation

## 2021-10-22 DIAGNOSIS — M797 Fibromyalgia: Secondary | ICD-10-CM | POA: Insufficient documentation

## 2021-10-22 DIAGNOSIS — M5417 Radiculopathy, lumbosacral region: Secondary | ICD-10-CM | POA: Insufficient documentation

## 2021-10-22 DIAGNOSIS — M79602 Pain in left arm: Secondary | ICD-10-CM | POA: Diagnosis present

## 2021-10-22 DIAGNOSIS — M5442 Lumbago with sciatica, left side: Secondary | ICD-10-CM | POA: Insufficient documentation

## 2021-10-22 DIAGNOSIS — M4316 Spondylolisthesis, lumbar region: Secondary | ICD-10-CM | POA: Diagnosis present

## 2021-10-22 DIAGNOSIS — Z79899 Other long term (current) drug therapy: Secondary | ICD-10-CM | POA: Diagnosis present

## 2021-10-22 DIAGNOSIS — Z79891 Long term (current) use of opiate analgesic: Secondary | ICD-10-CM | POA: Insufficient documentation

## 2021-10-22 DIAGNOSIS — M5136 Other intervertebral disc degeneration, lumbar region: Secondary | ICD-10-CM | POA: Diagnosis present

## 2021-10-22 DIAGNOSIS — M21371 Foot drop, right foot: Secondary | ICD-10-CM | POA: Diagnosis present

## 2021-10-22 DIAGNOSIS — M25551 Pain in right hip: Secondary | ICD-10-CM | POA: Diagnosis present

## 2021-10-22 DIAGNOSIS — M5459 Other low back pain: Secondary | ICD-10-CM | POA: Diagnosis present

## 2021-10-22 DIAGNOSIS — G894 Chronic pain syndrome: Secondary | ICD-10-CM | POA: Insufficient documentation

## 2021-10-22 DIAGNOSIS — M79601 Pain in right arm: Secondary | ICD-10-CM | POA: Insufficient documentation

## 2021-10-22 DIAGNOSIS — M5441 Lumbago with sciatica, right side: Secondary | ICD-10-CM | POA: Insufficient documentation

## 2021-10-22 DIAGNOSIS — M25552 Pain in left hip: Secondary | ICD-10-CM | POA: Diagnosis present

## 2021-10-22 DIAGNOSIS — M542 Cervicalgia: Secondary | ICD-10-CM | POA: Insufficient documentation

## 2021-10-22 DIAGNOSIS — Z969 Presence of functional implant, unspecified: Secondary | ICD-10-CM | POA: Diagnosis present

## 2021-10-22 MED ORDER — OXYCODONE HCL 5 MG PO TABS: 5.0000 mg | ORAL_TABLET | Freq: Four times a day (QID) | ORAL | 0 refills | Status: DC | PRN

## 2021-10-22 NOTE — Progress Notes (Signed)
Nursing Pain Medication Assessment:  Safety precautions to be maintained throughout the outpatient stay will include: orient to surroundings, keep bed in low position, maintain call bell within reach at all times, provide assistance with transfer out of bed and ambulation.  Medication Inspection Compliance: Pill count conducted under aseptic conditions, in front of the patient. Neither the pills nor the bottle was removed from the patient's sight at any time. Once count was completed pills were immediately returned to the patient in their original bottle.  Medication: Oxycodone IR Pill/Patch Count:  39 of 120 pills remain Pill/Patch Appearance: Markings consistent with prescribed medication Bottle Appearance: Standard pharmacy container. Clearly labeled. Filled Date: 07 / 28 / 2023 Last Medication intake:  Today

## 2021-10-25 LAB — TOXASSURE SELECT 13 (MW), URINE

## 2021-11-01 ENCOUNTER — Ambulatory Visit
Admission: RE | Admit: 2021-11-01 | Discharge: 2021-11-01 | Disposition: A | Discharge status: Home / Self Care | Payer: 59 | Source: Ambulatory Visit | Attending: Pain Medicine | Admitting: Pain Medicine

## 2021-11-01 DIAGNOSIS — M5417 Radiculopathy, lumbosacral region: Secondary | ICD-10-CM | POA: Insufficient documentation

## 2021-11-01 DIAGNOSIS — M79604 Pain in right leg: Secondary | ICD-10-CM | POA: Insufficient documentation

## 2021-11-01 DIAGNOSIS — M5442 Lumbago with sciatica, left side: Secondary | ICD-10-CM | POA: Diagnosis present

## 2021-11-01 DIAGNOSIS — M21371 Foot drop, right foot: Secondary | ICD-10-CM | POA: Insufficient documentation

## 2021-11-01 DIAGNOSIS — M5136 Other intervertebral disc degeneration, lumbar region: Secondary | ICD-10-CM | POA: Insufficient documentation

## 2021-11-01 DIAGNOSIS — M47816 Spondylosis without myelopathy or radiculopathy, lumbar region: Secondary | ICD-10-CM | POA: Diagnosis present

## 2021-11-01 DIAGNOSIS — G8929 Other chronic pain: Secondary | ICD-10-CM | POA: Insufficient documentation

## 2021-11-01 DIAGNOSIS — M5441 Lumbago with sciatica, right side: Secondary | ICD-10-CM | POA: Diagnosis present

## 2021-11-01 DIAGNOSIS — M5416 Radiculopathy, lumbar region: Secondary | ICD-10-CM | POA: Diagnosis present

## 2021-11-01 DIAGNOSIS — M4310 Spondylolisthesis, site unspecified: Secondary | ICD-10-CM | POA: Insufficient documentation

## 2021-11-01 DIAGNOSIS — M79605 Pain in left leg: Secondary | ICD-10-CM | POA: Diagnosis present

## 2021-11-01 DIAGNOSIS — M4316 Spondylolisthesis, lumbar region: Secondary | ICD-10-CM | POA: Insufficient documentation

## 2021-11-05 ENCOUNTER — Telehealth: Payer: Self-pay | Admitting: Pain Medicine

## 2021-11-05 NOTE — Telephone Encounter (Signed)
PT stated that she is having pain in both legs ,right leg hurts more. PT stated that when she layed down over the weekend she could lift her legs at all. PT states that she is having to use an cane. PT states that she is having sharp pain in her big toe on the right foot. PT stated that she have been having numbness in legs and foot. PT stated that it's hard for her to drive.Pt stated that since she has been seen pain hasn't gotta any better. Please give patient a call.

## 2021-11-05 NOTE — Telephone Encounter (Signed)
I scheduled patient for Wed 11-07-21 at 1:40

## 2021-11-06 NOTE — Progress Notes (Unsigned)
PROVIDER NOTE: Information contained herein reflects review and annotations entered in association with encounter. Interpretation of such information and data should be left to medically-trained personnel. Information provided to patient can be located elsewhere in the medical record under "Patient Instructions". Document created using STT-dictation technology, any transcriptional errors that may result from process are unintentional.    Patient: Mary Cruz  Service Category: E/M  Provider: Gaspar Cola, MD  DOB: 06-12-1975  DOS: 11/07/2021  Referring Provider: Samuel Bouche, NP  MRN: [id number]  Specialty: Interventional Pain Management  PCP: Samuel Bouche, NP  Type: Established Patient  Setting: Ambulatory outpatient    Location: Office  Delivery: Face-to-face     HPI  Mary Cruz, a 46 y.o. year old female, is here today because of her Complaints of weakness of lower extremity [R29.898]. Mary Cruz primary complain today is Back Pain (Lumbar bilateral ) and Leg Pain (Bilateral ) Last encounter: My last encounter with her was on 11/05/2021. Pertinent problems: Mary Cruz has Chronic pain syndrome; Chronic musculoskeletal pain; Osteoarthrosis; Presence of functional implant ([employer]tr[employer] Lumbar spinal cord stimulator implant); Chronic low back pain (Bilateral) (R>L) w/o sciatica; Chronic lumbar radicular pain (Right L5 dermatome; Left S1 Dermatome) (Bilateral) (R>L); Chronic neck pain (1ry area of Pain) (Bilateral) (L>R); Chronic cervical radicular pain (C7 Dermatome) (Bilateral) (L>R); Chronic lower extremity pain (Bilateral) (R>L); Lumbar spondylosis; Lumbar facet syndrome (Bilateral) (R>L); Neurogenic pain; Fibromyalgia; Pain in right knee; Chronic upper extremity pain (2ry area of Pain) (Bilateral) (L>R); Chronic hip pain (3ry area of Pain) (Bilateral) (R>L); Closed fracture of scapula, sequela (Right); DDD (degenerative disc disease), cervical; Cervicalgia; Abnormal MRI, shoulder  (Right); Spondylosis without myelopathy or radiculopathy, lumbosacral region; DDD (degenerative disc disease), lumbar; Osteoarthritis involving multiple joints; Chronic low back pain (Bilateral) (R>L) w/ sciatica (Bilateral) (R>L); Chronic lower extremity pain (Right); Lumbosacral radiculopathy at L5 (Right); Dropfoot (Right); Lumbar facet arthropathy; Intractable low back pain; Closed fracture of multiple ribs of right side; Chronic feet pain (Bilateral); Migraines; Chronic hip pain (Right); Osteoarthritis of hip (Right); Grade 1 Retrolisthesis of L4/L5; Spondylolisthesis at L4-L5 level; Lumbosacral radiculopathy/radiculitis at L4 (Right); Lower extremity numbness (Bilateral); Complaints of weakness of lower extremities (Bilateral); and Abnormal MRI, lumbar spine (11/04/2021) on their pertinent problem list. Pain Assessment: Severity of Chronic pain is reported as a 8 /10. Location: Back (see visit info) Lower, Left, Right/back pain into right hip, into groin causing numbness and incontinence.  down legs to the feet tops and bottoms and toes. Onset: More than a month ago. Quality: Numbness, Discomfort, Constant, Tingling, Sharp. Timing: Constant. Modifying factor(s): lying down on side in fetal position and that helps alleviate the pain a little bit.. Vitals:  height is 5' 4"  (1.626 m) and weight is 266 lb (120.7 kg). Her temporal temperature is 97.8 F (36.6 C). Her blood pressure is 125/85 and her pulse is 92. Her respiration is 16 and oxygen saturation is 99%.   Reason for encounter:  The patient comes into the clinic today indicating that her bilateral lower extremity numbness and weakness continues to progress.  She confirms that this has been there since before the MRI .  The patient indicates that this has progressed to the point where she is having difficulty moving her lower extremities when she is in bed and she describes having had an episode of urinary incontinence as a consequence of this.  Today I  went over the results of the right hip MRI done on 06/28/2021, the CT of the pelvis  in abdomen done on 10/19/2021, as well as the results of the lumbar MRI done on 11/01/2021.  I went over those results with the patient and I have explained everything in layman's terms.  I have also provided them with a copy of the 3 reports.  In addition to this, I have reviewed some lab work recently done at the hospital and although it does have some abnormal findings, he does not have anything that would clearly explain the patient's symptoms.  For this reason, I am sending the patient to be evaluated by a neurologist and I have also requested bilateral lower extremity nerve conduction testing to see if this can assist with the patient's diagnosis.  Today I have asked the patient if there is any family history of similar symptoms or neuromuscular diseases and she has indicated that there is none.  Because the lumbar area, abdomen and pelvis, and hip studies all came back negative for a clear explanation as to the patient's current symptoms, I will be ordering a CT of the thoracic spine to evaluate the area where her spinal cord stimulator is implanted to determine if there is any pathology in that area that could explain her current condition.  This information and the plan was shared with the patient who understood and accepted.  At this time, I have informed the patient that after reviewing the available information during this visit, I cannot find a clear explanation for her signs and symptoms and there does not seem to be anything that I can offer her at this time to assist her with her current condition.  Again, she understood and accepted.  RTCB: 01/20/2022  Pharmacotherapy Assessment  Analgesic: Oxycodone IR 5 mg, 1 tab PO q 6 hrs (20 mg/day of oxycodone) MME/day: 30 mg/day.   Monitoring: [place] PMP: PDMP reviewed during this encounter.       Pharmacotherapy: No side-effects or adverse reactions reported. Compliance:  No problems identified. Effectiveness: Clinically acceptable.  Janett Billow, RN  11/07/2021  1:26 PM  Sign when Signing Visit Safety precautions to be maintained throughout the outpatient stay will include: orient to surroundings, keep bed in low position, maintain call bell within reach at all times, provide assistance with transfer out of bed and ambulation.     No results found for: "CBDTHCR" No results found for: "D8THCCBX" No results found for: "D9THCCBX"  UDS:  Summary  Date Value Ref Range Status  10/22/2021 Note  Final    Comment:    ==================================================================== [facility] (MW) ==================================================================== Test                             Result       Flag       Units  Drug Present and Declared for Prescription Verification   Oxycodone                      178          EXPECTED   ng/mg creat   Oxymorphone                    261          EXPECTED   ng/mg creat   Noroxycodone                   1146         EXPECTED   ng/mg creat  Noroxymorphone                 143          EXPECTED   ng/mg creat    Sources of oxycodone are scheduled prescription medications.    Oxymorphone, noroxycodone, and noroxymorphone are expected    metabolites of oxycodone. Oxymorphone is also available as a    scheduled prescription medication.  ==================================================================== Test                      Result    Flag   Units      Ref Range   Creatinine              103              mg/dL      >=20 ==================================================================== Declared Medications:  The flagging and interpretation on this report are based on the  following declared medications.  Unexpected results may arise from  inaccuracies in the declared medications.   **Note: The testing scope of this panel includes these medications:   Oxycodone (Roxicodone)   **Note:  The testing scope of this panel does not include the  following reported medications:   Amitriptyline (Elavil)  Benzonatate (Tessalon)  Biotin  Duloxetine (Cymbalta)  Hydrochlorothiazide (Zestoretic)  Hyoscyamine (Levsin)  Levocetirizine (Xyzal)  Lisinopril (Zestoretic)  Methocarbamol (Robaxin)  Pantoprazole (Protonix)  Probiotic  Rizatriptan (Maxalt)  Topiramate (Topamax) ==================================================================== For clinical consultation, please call [phone]. ====================================================================       ROS  Constitutional: Denies any fever or chills Gastrointestinal: No reported hemesis, hematochezia, vomiting, or acute GI distress Musculoskeletal: Denies any acute onset joint swelling, redness, loss of ROM, or weakness Neurological: No reported episodes of acute onset apraxia, aphasia, dysarthria, agnosia, amnesia, paralysis, loss of coordination, or loss of consciousness  Medication Review  Biotin, DULoxetine, Probiotic Product, amitriptyline, hyoscyamine, levocetirizine, lisinopril-hydrochlorothiazide, methocarbamol, oxyCODONE, pantoprazole, rizatriptan, and topiramate  History Review  Allergy: Mary Cruz is allergic to celecoxib, doxycycline, gabapentin, latex, nabumetone, pentazocine lactate, seroquel [quetiapine], amoxicillin, bactrim [sulfamethoxazole-trimethoprim], cephalexin, erythromycin, hydromorphone hcl, and lyrica [pregabalin]. Drug: Mary Cruz  reports no history of drug use. Alcohol:  reports no history of alcohol use. Tobacco:  reports that she has been smoking cigarettes. She quit smokeless tobacco use about 3 years ago. Social: Mary Cruz  reports that she has been smoking cigarettes. She quit smokeless tobacco use about 3 years ago. She reports that she does not drink alcohol and does not use drugs. Medical:  has a past medical history of Abscess of axillary fold (10/10/2013), Acute  bronchitis with asthma with acute exacerbation (01/23/2015), Dehydration (04/19/2019), Elevated lactic acid level (04/19/2019), Encounter for management of implanted device (12/19/2014), Encounter for therapeutic drug level monitoring (12/19/2014), Fibromyalgia, GERD (gastroesophageal reflux disease), Hyperglycemia (04/19/2019), Hypertension, Implant pain at battery site (right buttocks area) (04/05/2015), KIDNEY STONES (07/02/2007), Long term current use of opiate analgesic (12/19/2014), Morbid obesity ([place]) (12/19/2014), Muscle cramping (12/19/2014), Opiate use (30 MME/Day) (12/19/2014), Osteoarthritis, Pain, Right knee pain (01/10/2015), Sepsis ([place]) (04/19/2019), Sepsis affecting skin (10/10/2013), and Tobacco abuse (04/19/2019). Surgical: Mary Cruz  has a past surgical history that includes spinal cord stimulator; Occipital nerve stimulator insertion; Cholecystectomy (2000); Knee surgery; Shoulder surgery; Tubal ligation (2000); Endometrial ablation; Spinal cord stimulator implant (2008); Knee surgery (Right, 02/2015); Breast surgery; Spinal cord stimulator insertion (N/A, 06/21/2016); Shoulder arthroscopy with distal clavicle resection (Right, 10/29/2018); Subacromial decompression (Right, 10/29/2018); laparoscopic appendectomy (Right, 01/01/2020); Appendectomy; Ankle surgery; Colonoscopy with esophagogastroduodenoscopy (egd); Colonoscopy (  N/A, 02/20/2021); and Esophagogastroduodenoscopy (N/A, 02/20/2021). Family: family history includes Asthma in her mother; Depression in her mother; Fibromyalgia in an other family member; Stroke in an other family member; Thyroid disease in an other family member.  Laboratory Chemistry Profile   Renal Lab Results  Component Value Date   BUN 21 (H) 10/13/2021   CREATININE 1.59 (H) 10/13/2021   LABCREA 51 11/20/2020   BCR 16 07/23/2021   GFRAA 84 08/03/2020   GFRNONAA 41 (L) 10/13/2021    Hepatic Lab Results  Component Value Date   AST 16 10/13/2021   ALT 19  10/13/2021   ALBUMIN 3.4 (L) 10/13/2021   ALKPHOS 88 10/13/2021   LIPASE 24 10/13/2021    Electrolytes Lab Results  Component Value Date   NA 141 10/13/2021   K 3.5 10/13/2021   CL 108 10/13/2021   CALCIUM 9.1 10/13/2021   MG 2.0 04/20/2019   PHOS 3.2 04/20/2019    Bone Lab Results  Component Value Date   VD25OH 21 (L) 07/23/2021    Inflammation (CRP: Acute Phase) (ESR: Chronic Phase) Lab Results  Component Value Date   CRP 1.0 (H) 04/05/2015   ESRSEDRATE 28 (H) 11/20/2020   LATICACIDVEN 1.8 10/13/2021         Note: Above Lab results reviewed.  Recent Imaging Review  MR LUMBAR SPINE WO CONTRAST CLINICAL DATA:  Low back pain, symptoms persist with > 6 wks treatment Lumbar radiculopathy, symptoms persist with > 6 wks treatment  EXAM: MRI LUMBAR SPINE WITHOUT CONTRAST  TECHNIQUE: Multiplanar, multisequence MR imaging of the lumbar spine was performed. No intravenous contrast was administered.  COMPARISON:  MRI of the lumbar spine April 07, 2017.  FINDINGS: Segmentation: Standard segmentation is a soon. The inferior-most fully formed intervertebral disc is labeled L5-S1.  Alignment:  Normal.  Vertebrae: No fracture, evidence of discitis, or suspicious bone lesion.  Conus medullaris and cauda equina: Conus extends to the T12-L1 level. Conus and cauda equina appear normal.  Paraspinal and other soft tissues: Unremarkable.  Disc levels:  T12-L1: No significant disc protrusion, foraminal stenosis, or canal stenosis.  L1-L2: No significant disc protrusion, foraminal stenosis, or canal stenosis.  L2-L3: No significant disc protrusion, foraminal stenosis, or canal stenosis.  L3-L4: No significant disc protrusion, foraminal stenosis, or canal stenosis.  L4-L5: Mild facet arthropathy. Left far lateral/extraforaminal disc protrusion with mild resulting left foraminal stenosis. No significant canal or right foraminal stenosis.  L5-S1: Small left  foraminal disc protrusion. Bilateral facet arthropathy. No significant stenosis.  IMPRESSION: Lower lumbar degenerative change without impingement.  Electronically Signed   By: Margaretha Sheffield M.D.   On: 11/04/2021 13:19 Note: Reviewed        Physical Exam  General appearance: Well nourished, well developed, and well hydrated. In no apparent acute distress Mental status: Alert, oriented x 3 (person, place, & time)       Respiratory: No evidence of acute respiratory distress Eyes: PERLA Vitals: BP 125/85 (BP Location: Right Arm, Patient Position: Sitting, Cuff Size: Large)   Pulse 92   Temp 97.8 F (36.6 C) (Temporal)   Resp 16   Ht 5' 4"  (1.626 m)   Wt 266 lb (120.7 kg)   SpO2 99%   BMI 45.66 kg/m  BMI: Estimated body mass index is 45.66 kg/m as calculated from the following:   Height as of this encounter: 5' 4"  (1.626 m).   Weight as of this encounter: 266 lb (120.7 kg). Ideal: Ideal body weight: 54.7 kg (120 lb  9.5 oz) Adjusted ideal body weight: 81.1 kg (178 lb 12.1 oz)  Assessment   Diagnosis Status  1. Complaints of weakness of lower extremities (Bilateral)   2. Lower extremity numbness (Bilateral)   3. Other intervertebral disc degeneration, thoracic region   4. Chronic low back pain (Bilateral) (R>L) w/ sciatica (Bilateral) (R>L)   5. History of urinary incontinence   6. Presence of functional implant (Medtronic Lumbar spinal cord stimulator implant)   7. Abnormal MRI, lumbar spine (11/04/2021)    Controlled Controlled Controlled   Updated Problems: Problem  Lower extremity numbness (Bilateral)  Complaints of weakness of lower extremities (Bilateral)  Abnormal MRI, lumbar spine (11/04/2021)   (11/04/2021) LUMBAR MRI FINDINGS: Segmentation: Standard segmentation is a soon. The inferior-most fully formed intervertebral disc is labeled L5-S1.   Alignment:  Normal.   Vertebrae: No fracture, evidence of discitis, or suspicious bone lesion.   Conus  medullaris and cauda equina: Conus extends to the T12-L1 level. Conus and cauda equina appear normal.   Paraspinal and other soft tissues: Unremarkable.   Disc levels:   T12-L1: No significant disc protrusion, foraminal stenosis, or canal stenosis.   L1-L2: No significant disc protrusion, foraminal stenosis, or canal stenosis.   L2-L3: No significant disc protrusion, foraminal stenosis, or canal stenosis.   L3-L4: No significant disc protrusion, foraminal stenosis, or canal stenosis.   L4-L5: Mild facet arthropathy. Left far lateral/extraforaminal disc protrusion with mild resulting left foraminal stenosis. No significant canal or right foraminal stenosis.   L5-S1: Small left foraminal disc protrusion. Bilateral facet arthropathy. No significant stenosis.   IMPRESSION: Lower lumbar degenerative change without impingement.   History of Urinary Incontinence     Plan of Care  Problem-specific:  No problem-specific Assessment & Plan notes found for this encounter.  Mary Cruz has a current medication list which includes the following long-term medication(s): amitriptyline, duloxetine, hyoscyamine, levocetirizine, lisinopril-hydrochlorothiazide, methocarbamol, oxycodone, [START ON 11/21/2021] oxycodone, [START ON 12/21/2021] oxycodone, pantoprazole, rizatriptan, and topiramate.  Pharmacotherapy (Medications Ordered): No orders of the defined types were placed in this encounter.  Orders:  Orders Placed This Encounter  Procedures   CT THORACIC SPINE WO CONTRAST    Patient presents with axial pain with possible radicular component. Please assist [place] in identifying specific level(s) and laterality of any additional findings such as: 1. Facet (Zygapophyseal) joint DJD (Hypertrophy, space narrowing, subchondral sclerosis, and/or osteophyte formation) 2. DDD and/or IVDD (Loss of disc height, desiccation, gas patterns, osteophytes, endplate sclerosis, or "Black disc  disease") 3. Pars defects 4. Spondylolisthesis, spondylosis, and/or spondyloarthropathies (include Degree/Grade of displacement in mm) (stability) 5. Vertebral body Fractures (acute/chronic) (state percentage of collapse) 6. Demineralization (osteopenia/osteoporotic) 7. Bone pathology 8. Foraminal narrowing  9. Surgical changes 10. Central, Lateral Recess, and/or Foraminal Stenosis (include AP diameter of stenosis in mm) 11. Surgical changes (hardware type, status, and presence of fibrosis) 12. Modic Type Changes (MRI only) 13. IVDD (Disc bulge, protrusion, herniation, extrusion) (Level, laterality, extent)    Standing Status:   Future    Standing Expiration Date:   12/07/2021    Scheduling Instructions:     Imaging must be done as soon as possible. Inform patient that order will expire within 30 days and I will not renew it.    Order Specific Question:   Is patient pregnant?    Answer:   No    Order Specific Question:   Preferred imaging location?    Answer:   [facility]    Order Specific Question:  Call Results- Best Contact Number?    Answer:   [phone] ([facility])    Order Specific Question:   Radiology Contrast Protocol - do NOT remove file path    Answer:   [email or web address]   Ambulatory referral to Neurology    Referral Priority:   Routine    Referral Type:   Consultation    Referral Reason:   Specialty Services Required    Referred to Provider:   Anabel Bene, MD    Requested Specialty:   Neurology    Number of Visits Requested:   1   NCV with EMG(electromyography)    Bilateral testing requested.    Standing Status:   Future    Standing Expiration Date:   01/07/2022    Scheduling Instructions:     Please refer this patient to [facility] for Nerve Conduction testing of the lower extremities. (EMG & PNCV)    Order Specific Question:   Where should this test be performed?    Answer:   other    Order  Specific Question:   Release to patient    Answer:   Immediate   Follow-up plan:   Return for scheduled encounter.     Interventional Therapies  Risk  Complexity Considerations:   Estimated body mass index is 41.2 kg/m as calculated from the following:   Height as of this encounter: 5' 4"  (1.626 m).   Weight as of this encounter: 240 lb (108.9 kg). Latex allergy    Planned  Pending:   Diagnostic right hip x-rays and MRI (06/13/2021) Diagnostic/therapeutic right IA hip joint inj. #1    Under consideration:   Diagnostic/therapeutic right IA hip joint inj. #1  Diagnostic right suprascapular NB #1    Completed:   Palliative bilateral lumbar facet MBB x2 (11/16/2020) (90/90/80/85-90)  Therapeutic left lumbar facet MBB x2 (10/10/2016) (100/100/100/100)  Therapeutic right lumbar facet MBB x1 (08/31/2015) (n/a)  Therapeutic right lumbar facet RFA x3 (05/08/2021) (100/100/50/50) on the right side Therapeutic left lumbar facet RFA x3 (03/22/2021) (100/100/99/99) on left side Palliative left C7-T1 CESI x1 (04/13/2015) (n/a)    Therapeutic  Palliative (PRN) options:   Palliative lumbar facet MBB  Palliative lumbar facet RFA  Palliative C7-T1 CESI  PRN SCS programming adjustments (2018 replacement by Dr. Clydell Hakim)       Recent Visits Date Type Provider Dept  10/22/21 Office Visit Milinda Pointer, MD [facility]  Showing recent visits within past 90 days and meeting all other requirements Today's Visits Date Type Provider Dept  11/07/21 Office Visit Milinda Pointer, MD [facility]  Showing today's visits and meeting all other requirements Future Appointments Date Type Provider Dept  01/14/22 Appointment Milinda Pointer, MD [facility]  Showing future appointments within next 90 days and meeting all other requirements  I discussed the assessment and treatment plan with the patient. The patient was provided an opportunity to ask questions and  all were answered. The patient agreed with the plan and demonstrated an understanding of the instructions.  Patient advised to call back or seek an in-person evaluation if the symptoms or condition worsens.  Duration of encounter: 30 minutes.  Total time on encounter, as per AMA guidelines included both the face-to-face and non-face-to-face time personally spent by the physician and/or other qualified health care professional(s) on the day of the encounter (includes time in activities that require the physician or other qualified health care professional and does not include time in activities normally performed  by clinical staff). Physician's time may include the following activities when performed: preparing to see the patient (eg, review of tests, pre-charting review of records) obtaining and/or reviewing separately obtained history performing a medically appropriate examination and/or evaluation counseling and educating the patient/family/caregiver ordering medications, tests, or procedures referring and communicating with other health care professionals (when not separately reported) documenting clinical information in the electronic or other health record independently interpreting results (not separately reported) and communicating results to the patient/ family/caregiver care coordination (not separately reported)  Note by: Gaspar Cola, MD Date: 11/07/2021; Time: 2:02 PM

## 2021-11-07 ENCOUNTER — Encounter: Payer: Self-pay | Admitting: Pain Medicine

## 2021-11-07 ENCOUNTER — Ambulatory Visit: Payer: 59 | Attending: Pain Medicine | Admitting: Pain Medicine

## 2021-11-07 ENCOUNTER — Other Ambulatory Visit: Payer: Self-pay

## 2021-11-07 VITALS — BP 125/85 | HR 92 | Temp 97.8°F | Resp 16 | Ht 64.0 in | Wt 266.0 lb

## 2021-11-07 DIAGNOSIS — R200 Anesthesia of skin: Secondary | ICD-10-CM

## 2021-11-07 DIAGNOSIS — Z87898 Personal history of other specified conditions: Secondary | ICD-10-CM

## 2021-11-07 DIAGNOSIS — Z969 Presence of functional implant, unspecified: Secondary | ICD-10-CM

## 2021-11-07 DIAGNOSIS — M5134 Other intervertebral disc degeneration, thoracic region: Secondary | ICD-10-CM | POA: Diagnosis present

## 2021-11-07 DIAGNOSIS — R29898 Other symptoms and signs involving the musculoskeletal system: Secondary | ICD-10-CM

## 2021-11-07 DIAGNOSIS — R937 Abnormal findings on diagnostic imaging of other parts of musculoskeletal system: Secondary | ICD-10-CM

## 2021-11-07 DIAGNOSIS — M5442 Lumbago with sciatica, left side: Secondary | ICD-10-CM | POA: Diagnosis not present

## 2021-11-07 DIAGNOSIS — M5441 Lumbago with sciatica, right side: Secondary | ICD-10-CM | POA: Diagnosis present

## 2021-11-07 DIAGNOSIS — G8929 Other chronic pain: Secondary | ICD-10-CM | POA: Diagnosis present

## 2021-11-07 HISTORY — DX: Anesthesia of skin: R20.0

## 2021-11-07 HISTORY — DX: Personal history of other specified conditions: Z87.898

## 2021-11-07 HISTORY — DX: Abnormal findings on diagnostic imaging of other parts of musculoskeletal system: R93.7

## 2021-11-07 HISTORY — DX: Other symptoms and signs involving the musculoskeletal system: R29.898

## 2021-11-07 NOTE — Progress Notes (Signed)
Safety precautions to be maintained throughout the outpatient stay will include: orient to surroundings, keep bed in low position, maintain call bell within reach at all times, provide assistance with transfer out of bed and ambulation.  

## 2021-11-20 ENCOUNTER — Ambulatory Visit: Payer: 59

## 2021-11-26 ENCOUNTER — Telehealth: Payer: Self-pay | Admitting: Pain Medicine

## 2021-11-26 ENCOUNTER — Ambulatory Visit
Admission: RE | Admit: 2021-11-26 | Discharge: 2021-11-26 | Disposition: A | Discharge status: Home / Self Care | Payer: 59 | Source: Ambulatory Visit | Attending: Family Medicine | Admitting: Family Medicine

## 2021-11-26 VITALS — BP 106/72 | HR 110 | Temp 98.2°F | Resp 18 | Ht 64.0 in | Wt 266.0 lb

## 2021-11-26 DIAGNOSIS — L0501 Pilonidal cyst with abscess: Secondary | ICD-10-CM

## 2021-11-26 MED ORDER — OXYCODONE-ACETAMINOPHEN 5-325 MG PO TABS: 1.0000 | ORAL_TABLET | Freq: Four times a day (QID) | ORAL | 0 refills | Status: DC | PRN

## 2021-11-26 MED ORDER — CLINDAMYCIN HCL 150 MG PO CAPS
150.0000 mg | ORAL_CAPSULE | Freq: Three times a day (TID) | ORAL | 0 refills | Status: AC
Start: 2021-11-26 — End: 2021-12-03

## 2021-11-26 NOTE — Discharge Instructions (Addendum)
Advised patient to take medication as directed with food to completion.  Encouraged patient to increase daily water intake to 64 ounces per day while taking this medication.  Advised may use Norco sparingly for breakthrough abscess pain.  Advised patient to follow-up with general surgeon for further evaluation.  [facility], [place] [address], [address] [place], [place], [place] [address] [phone].

## 2021-11-26 NOTE — ED Triage Notes (Signed)
Patient c/o abscess on tailbone x 2 days.  Fever at home 100.0, dizzy, headache.  Patient has taken her pain meds.  The area is draining.

## 2021-11-26 NOTE — Telephone Encounter (Signed)
Patient is going to have surgery for a cyst on her tailbone. She was given 15 oxycodone for the pain. They may admit her today or tomorrow for surgery.

## 2021-11-26 NOTE — ED Provider Notes (Signed)
[facility]    CSN: [id number] Arrival date & time: 11/26/21  1006      History   Chief Complaint Chief Complaint  Patient presents with   Abscess    Abscess on tail bone second recurrence and I believe I have bronchitis. I've been coughing non stop and ribs are hurting. Cold chills, weak, but no fever yet. - Entered by patient   Appointment    HPI Mary Cruz is a 46 y.o. female.   HPI Pleasant 46 year old female presents with abscess of the right buttocks for 2 days.  Patient reports this is a recurrent abscess that has occurred in this area before.  PMH significant for morbid obesity, fibromyalgia, and HTN.  Past Medical History:  Diagnosis Date   Abscess of axillary fold 10/10/2013   Acute bronchitis with asthma with acute exacerbation 01/23/2015   Dehydration 04/19/2019   Elevated lactic acid level 04/19/2019   Encounter for management of implanted device 12/19/2014   Encounter for therapeutic drug level monitoring 12/19/2014   Fibromyalgia    GERD (gastroesophageal reflux disease)    Hyperglycemia 04/19/2019   Hypertension    Implant pain at battery site (right buttocks area) 04/05/2015   KIDNEY STONES 07/02/2007   Qualifier: History of  By: Bobby Rumpf CMA (AAMA), Patty     Long term current use of opiate analgesic 12/19/2014   Morbid obesity ([place]) 12/19/2014   Muscle cramping 12/19/2014   Opiate use (30 MME/Day) 12/19/2014   Osteoarthritis    Pain    chronic regional pain syndrome   Right knee pain 01/10/2015   Sepsis ([place]) 04/19/2019   Sepsis affecting skin 10/10/2013   Tobacco abuse 04/19/2019    Patient Active Problem List   Diagnosis Date Noted   Lower extremity numbness (Bilateral) 11/07/2021   Complaints of weakness of lower extremities (Bilateral) 11/07/2021   History of urinary incontinence 11/07/2021   Abnormal MRI, lumbar spine (11/04/2021) 11/07/2021   Grade 1 Retrolisthesis of L4/L5 07/31/2021   Spondylolisthesis at L4-L5 level  07/31/2021   Lumbosacral radiculopathy/radiculitis at L4 (Right) 07/31/2021   Osteoarthritis of hip (Right) 07/16/2021   Chronic hip pain (Right) 06/13/2021   FH: colon polyps 01/27/2021   History of colonic polyps 01/27/2021   Elevated fecal calprotectin 01/26/2021   Abnormal finding on GI tract imaging 01/26/2021   Chronic feet pain (Bilateral) 10/30/2020   Prediabetes 90/24/0973   Metabolic syndrome 53/29/9242   Mixed hyperlipidemia 08/04/2020   Chronic use of opiate for therapeutic purpose 06/27/2020   Closed fracture of multiple ribs of right side 05/08/2020   Contusion of both lungs 05/08/2020   Pneumothorax, right 05/08/2020   Interstitial cystitis 05/04/2020   Vaginal candidiasis 05/04/2020   Intractable low back pain 04/25/2020   Appendicitis 01/01/2020   Migraines 11/12/2019   Chronic low back pain (Bilateral) (R>L) w/ sciatica (Bilateral) (R>L) 06/21/2019   Chronic lower extremity pain (Right) 06/21/2019   Lumbosacral radiculopathy at L5 (Right) 06/21/2019   Dropfoot (Right) 06/21/2019   Lumbar facet arthropathy 06/21/2019   Colitis 04/19/2019   Osteoarthritis involving multiple joints 04/08/2019   Latex precautions, history of latex allergy 04/08/2019   Spondylosis without myelopathy or radiculopathy, lumbosacral region 02/25/2019   DDD (degenerative disc disease), lumbar 02/25/2019   DDD (degenerative disc disease), cervical 11/15/2018   Cervicalgia 11/15/2018   Abnormal MRI, shoulder (Right) 11/15/2018   Chronic upper extremity pain (2ry area of Pain) (Bilateral) (L>R) 08/20/2018   Chronic hip pain (3ry area of Pain) (Bilateral) (R>L) 08/20/2018  Closed fracture of scapula, sequela (Right) 08/20/2018   Pharmacologic therapy 08/20/2018   Disorder of skeletal system 08/20/2018   Problems influencing health status 08/20/2018   Pain in right knee 03/19/2017   Family history of thyroid disease 10/24/2016   Neurogenic pain 10/10/2016   Fibromyalgia 10/10/2016    Spinal cord stimulator status 05/16/2016   Battery end of life of spinal cord stimulator 05/16/2016   Positive ANA (antinuclear antibody) 09/13/2015   Lumbar spondylosis 06/29/2015   Lumbar facet syndrome (Bilateral) (R>L) 06/29/2015   Anxiety and depression 01/20/2015   Long term prescription opiate use 83/66/2947   Uncomplicated opioid dependence ([place]) 12/19/2014   Chronic musculoskeletal pain 12/19/2014   Osteoarthrosis 12/19/2014   Vitamin D insufficiency 12/19/2014   Presence of functional implant ([employer]onic Lumbar spinal cord stimulator implant) 12/19/2014   Chronic low back pain (Bilateral) (R>L) w/o sciatica 12/19/2014   Chronic lumbar radicular pain (Right L5 dermatome; Left S1 Dermatome) (Bilateral) (R>L) 12/19/2014   Chronic neck pain (1ry area of Pain) (Bilateral) (L>R) 12/19/2014   Morbid obesity with BMI of 40.0-44.9, adult ([place]) 12/19/2014   Chronic cervical radicular pain (C7 Dermatome) (Bilateral) (L>R) 12/19/2014   Chronic lower extremity pain (Bilateral) (R>L) 12/19/2014   Severe obesity with body mass index (BMI) of 35.0 to 39.9 with comorbidity ([place]) 10/27/2014   Generalized anxiety disorder 03/16/2014   Essential hypertension, benign 07/09/2013   Chronic pain syndrome 07/09/2013   Gastroesophageal reflux disease 07/02/2007   Irritable bowel syndrome with both constipation and diarrhea 07/02/2007    Past Surgical History:  Procedure Laterality Date   ANKLE SURGERY     APPENDECTOMY     BREAST SURGERY     CHOLECYSTECTOMY  2000   COLONOSCOPY N/A 02/20/2021   Procedure: COLONOSCOPY;  Surgeon: Lesly Rubenstein, MD;  Location: [facility] ENDOSCOPY;  Service: Endoscopy;  Laterality: N/A;   COLONOSCOPY WITH ESOPHAGOGASTRODUODENOSCOPY (EGD)     ENDOMETRIAL ABLATION     ESOPHAGOGASTRODUODENOSCOPY N/A 02/20/2021   Procedure: ESOPHAGOGASTRODUODENOSCOPY (EGD);  Surgeon: Lesly Rubenstein, MD;  Location: [facility] ENDOSCOPY;  Service: Endoscopy;  Laterality: N/A;   KNEE SURGERY      KNEE SURGERY Right 02/2015   LAPAROSCOPIC APPENDECTOMY Right 01/01/2020   Procedure: APPENDECTOMY LAPAROSCOPIC;  Surgeon: Rolm Bookbinder, MD;  Location: [facility];  Service: General;  Laterality: Right;   OCCIPITAL NERVE STIMULATOR INSERTION     SHOULDER ARTHROSCOPY WITH DISTAL CLAVICLE RESECTION Right 10/29/2018   Procedure: SHOULDER ARTHROSCOPY WITH DISTAL CLAVICLE RESECTION;  Surgeon: Hiram Gash, MD;  Location: [place];  Service: Orthopedics;  Laterality: Right;   SHOULDER SURGERY     spinal cord stimulator     SPINAL CORD STIMULATOR IMPLANT  2008   SPINAL CORD STIMULATOR INSERTION N/A 06/21/2016   Procedure: LUMBAR SPINAL CORD STIMULATOR INSERTION;  Surgeon: Clydell Hakim, MD;  Location: [place];  Service: Neurosurgery;  Laterality: N/A;  LUMBAR SPINAL CORD STIMULATOR INSERTION   SUBACROMIAL DECOMPRESSION Right 10/29/2018   Procedure: SUBACROMIAL DECOMPRESSION;  Surgeon: Hiram Gash, MD;  Location: [place];  Service: Orthopedics;  Laterality: Right;   TUBAL LIGATION  2000    OB History   No obstetric history on file.      Home Medications    Prior to Admission medications   Medication Sig Start Date End Date Taking? Authorizing Provider  amitriptyline (ELAVIL) 100 MG tablet TAKE 1 TABLET BY MOUTH EVERYDAY AT BEDTIME Patient taking differently: Take 100 mg by mouth at bedtime. 07/23/21  Yes Samuel Bouche,  NP  Biotin 5000 MCG CAPS Take 5,000 mcg by mouth daily.   Yes [provider]  clindamycin (CLEOCIN) 150 MG capsule Take 1 capsule (150 mg total) by mouth 3 (three) times daily for 7 days. 11/26/21 12/03/21 Yes Eliezer Lofts, FNP  DULoxetine (CYMBALTA) 60 MG capsule Take 1 capsule (60 mg total) by mouth 2 (two) times daily. 07/23/21  Yes Samuel Bouche, NP  hyoscyamine (LEVSIN SL) 0.125 MG SL tablet Place 1 tablet (0.125 mg total) under the tongue daily as needed. 07/23/21  Yes Samuel Bouche, NP  levocetirizine (XYZAL) 5 MG tablet Take 1  tablet (5 mg total) by mouth every evening. 07/23/21  Yes Samuel Bouche, NP  lisinopril-hydrochlorothiazide (ZESTORETIC) 10-12.5 MG tablet Take 1 tablet by mouth daily. 07/23/21  Yes Samuel Bouche, NP  methocarbamol (ROBAXIN) 750 MG tablet Take 1 tablet (750 mg total) by mouth every 8 (eight) hours as needed for muscle spasms. 07/23/21  Yes Samuel Bouche, NP  oxyCODONE (OXY IR/ROXICODONE) 5 MG immediate release tablet Take 1 tablet (5 mg total) by mouth every 6 (six) hours as needed for severe pain. Must last 30 days 11/21/21 12/21/21 Yes Milinda Pointer, MD  oxyCODONE (OXY IR/ROXICODONE) 5 MG immediate release tablet Take 1 tablet (5 mg total) by mouth every 6 (six) hours as needed for severe pain. Must last 30 days 12/21/21 01/20/22 Yes Milinda Pointer, MD  oxyCODONE-acetaminophen (PERCOCET/ROXICET) 5-325 MG tablet Take 1 tablet by mouth every 6 (six) hours as needed for severe pain. 11/26/21  Yes Eliezer Lofts, FNP  pantoprazole (PROTONIX) 40 MG tablet TAKE 1 TABLET(40 MG) BY MOUTH DAILY 10/23/21  Yes Samuel Bouche, NP  Probiotic Product (ALIGN PO) Take 1 tablet by mouth daily.   Yes [provider]  rizatriptan (MAXALT) 10 MG tablet Take 1 tablet (10 mg total) by mouth as needed for migraine. May repeat in 2 hours if needed Patient taking differently: Take 10 mg by mouth daily as needed for migraine. May repeat in 2 hours if needed 07/23/21  Yes Samuel Bouche, NP  topiramate (TOPAMAX) 50 MG tablet Take 1 tablet (50 mg total) by mouth 2 (two) times daily. Patient taking differently: Take 50 mg by mouth daily. 09/04/21  Yes Samuel Bouche, NP  oxyCODONE (OXY IR/ROXICODONE) 5 MG immediate release tablet Take 1 tablet (5 mg total) by mouth every 6 (six) hours as needed for severe pain. Must last 30 days 10/22/21 11/21/21  Milinda Pointer, MD    Family History Family History  Problem Relation Age of Onset   Depression Mother        maternal grandmother   Asthma Mother    Fibromyalgia Other         aunt   Stroke Other        grandmother   Thyroid disease Other        grandmother    Social History Social History   Tobacco Use   Smoking status: Every Day    Types: Cigarettes    Last attempt to quit: 05/06/2020    Years since quitting: 1.5   Smokeless tobacco: Former    Quit date: 12/22/2017  Vaping Use   Vaping Use: Former  Substance Use Topics   Alcohol use: No    Alcohol/week: 0.0 standard drinks of alcohol   Drug use: No     Allergies   Celecoxib, Doxycycline, Gabapentin, Latex, Nabumetone, Pentazocine lactate, Seroquel [quetiapine], Amoxicillin, Bactrim [sulfamethoxazole-trimethoprim], Cephalexin, Erythromycin, Hydromorphone hcl, and Lyrica [pregabalin]   Review of Systems Review of  Systems  Skin:        Patient presents with abscess of tailbone for 2 days.  Patient reports area is draining.  All other systems reviewed and are negative.    Physical Exam Triage Vital Signs ED Triage Vitals  Enc Vitals Group     BP 11/26/21 1025 106/72     Pulse Rate 11/26/21 1025 (!) 110     Resp 11/26/21 1025 18     Temp 11/26/21 1025 98.2 F (36.8 C)     Temp Source 11/26/21 1025 Oral     SpO2 11/26/21 1025 96 %     Weight 11/26/21 1030 266 lb (120.7 kg)     Height 11/26/21 1030 5' 4"  (1.626 m)     Head Circumference --      Peak Flow --      Pain Score 11/26/21 1029 8     Pain Loc --      Pain Edu? --      Excl. in [place]? --    No data found.  Updated Vital Signs BP 106/72 (BP Location: Left Arm)   Pulse (!) 110   Temp 98.2 F (36.8 C) (Oral)   Resp 18   Ht 5' 4"  (1.626 m)   Wt 266 lb (120.7 kg)   SpO2 96%   BMI 45.66 kg/m       Physical Exam Vitals and nursing note reviewed. Exam conducted with a chaperone present.  Constitutional:      Appearance: Normal appearance. She is obese.  HENT:     Head: Normocephalic and atraumatic.     Mouth/Throat:     Mouth: Mucous membranes are moist.     Pharynx: Oropharynx is clear.  Eyes:     Extraocular  Movements: Extraocular movements intact.     Conjunctiva/sclera: Conjunctivae normal.     Pupils: Pupils are equal, round, and reactive to light.  Cardiovascular:     Rate and Rhythm: Normal rate and regular rhythm.     Pulses: Normal pulses.     Heart sounds: Normal heart sounds.  Pulmonary:     Effort: Pulmonary effort is normal.     Breath sounds: Normal breath sounds. No wheezing, rhonchi or rales.  Musculoskeletal:     Cervical back: Normal range of motion and neck supple.  Skin:    General: Skin is warm and dry.     Comments: Right buttocks/gluteal cleft: Erythematous pilonidal cyst-please see image inserted below  Neurological:     General: No focal deficit present.     Mental Status: She is alert and oriented to person, place, and time.       UC Treatments / Results  Labs (all labs ordered are listed, but only abnormal results are displayed) Labs Reviewed - No data to display  EKG   Radiology No results found.  Procedures Procedures (including critical care time)  Medications Ordered in UC Medications - No data to display  Initial Impression / Assessment and Plan / UC Course  I have reviewed the triage vital signs and the nursing notes.  Pertinent labs & imaging results that were available during my care of the patient were reviewed by me and considered in my medical decision making (see chart for details).     MDM: 1.  Pilonidal abscess-Rx'd Clindamycin, Percocet. Advised patient to take medication as directed with food to completion.  Encouraged patient to increase daily water intake to 64 ounces per day while taking this medication.  Advised may use  Norco sparingly for breakthrough abscess pain.  Advised patient to follow-up with general surgeon for further evaluation.  [facility] [place] [address], [address], [place], [place] [address] [phone].  Patient discharged home, hemodynamically stable. Final Clinical Impressions(s) / UC Diagnoses    Final diagnoses:  Pilonidal abscess     Discharge Instructions      Advised patient to take medication as directed with food to completion.  Encouraged patient to increase daily water intake to 64 ounces per day while taking this medication.  Advised may use Norco sparingly for breakthrough abscess pain.  Advised patient to follow-up with general surgeon for further evaluation.  [facility] [place] [address], [place], [place] [address] [phone].     ED Prescriptions     Medication Sig Dispense Auth. Provider   oxyCODONE-acetaminophen (PERCOCET/ROXICET) 5-325 MG tablet Take 1 tablet by mouth every 6 (six) hours as needed for severe pain. 15 tablet Eliezer Lofts, FNP   clindamycin (CLEOCIN) 150 MG capsule Take 1 capsule (150 mg total) by mouth 3 (three) times daily for 7 days. 21 capsule Eliezer Lofts, FNP      I have reviewed the PDMP during this encounter.   Eliezer Lofts, [place] 11/26/21 1153

## 2021-11-27 ENCOUNTER — Telehealth: Payer: Self-pay

## 2021-11-27 NOTE — Telephone Encounter (Signed)
TCT pt to follow up from recent visit. Pt denies any current needs or concerns. Pt states she followed up with [facility] yesterday at 3:50.

## 2021-12-17 ENCOUNTER — Encounter: Payer: Self-pay | Admitting: Medical-Surgical

## 2021-12-17 ENCOUNTER — Telehealth (INDEPENDENT_AMBULATORY_CARE_PROVIDER_SITE_OTHER): Payer: 59 | Admitting: Medical-Surgical

## 2021-12-17 DIAGNOSIS — R110 Nausea: Secondary | ICD-10-CM | POA: Diagnosis not present

## 2021-12-17 DIAGNOSIS — R200 Anesthesia of skin: Secondary | ICD-10-CM | POA: Diagnosis not present

## 2021-12-17 DIAGNOSIS — G43009 Migraine without aura, not intractable, without status migrainosus: Secondary | ICD-10-CM | POA: Diagnosis not present

## 2021-12-17 DIAGNOSIS — R42 Dizziness and giddiness: Secondary | ICD-10-CM | POA: Diagnosis not present

## 2021-12-17 MED ORDER — MECLIZINE HCL 25 MG PO TABS: 25.0000 mg | ORAL_TABLET | Freq: Three times a day (TID) | ORAL | 1 refills | Status: DC | PRN

## 2021-12-17 MED ORDER — DULOXETINE HCL 60 MG PO CPEP
60.0000 mg | ORAL_CAPSULE | Freq: Two times a day (BID) | ORAL | 3 refills | Status: DC
Start: 2021-12-17 — End: 2022-08-19

## 2021-12-17 NOTE — Progress Notes (Signed)
Virtual Visit via Video Note  I connected with Mary Cruz on 12/17/21 at  1:00 PM EST by a video enabled telemedicine application and verified that I am speaking with the correct person using two identifiers.   I discussed the limitations of evaluation and management by telemedicine and the availability of in person appointments. The patient expressed understanding and agreed to proceed.  Patient location: home Provider locations: office  Subjective:    CC: recent health changes, dizziness, numbness  HPI: Pleasant 46 year old female resenting via MyChart video visit to discuss recent health changes.  Approximately 2 weeks ago, her previous issues with numbness in her hands and feet have gotten worse.  Notes that her toes on both feet are always numb now.  Her numbness on her hands is more intermittent.   Reports that her right leg feels like it is on fire and she has had some shaking in her right hand that seems like tremors.  She also has had worsening of dizziness accompanied by nausea and headaches.  Feels that migraines have gotten worse and seem to accompany her dizzy spells.  She has not been able to relate the dizzy spells to any particular position changes or activities.  Happening at least twice a week but not every day.  She has been taking Maxalt for migraines which helps with the headache but does not solve the dizziness or nausea issues. The dizziness and nausea will last an hour or so, may be more. She has not found anything that particularly helps those symptoms.  She admits that she should be drinking more water.  Reports that her pain management provider has advised she go to neurology for further evaluation and placed a referral.  Past medical history, Surgical history, Family history not pertinant except as noted below, Social history, Allergies, and medications have been entered into the medical record, reviewed, and corrections made.   Review of Systems: See HPI for  pertinent positives and negatives.   Objective:    General: Speaking clearly in complete sentences without any shortness of breath.  Alert and oriented x3.  Normal judgment. No apparent acute distress.  Impression and Recommendations:    1. Dizziness 2. Numbness 3. Migraine without aura and without status migrainosus, not intractable Unfortunately, her symptoms are very nonspecific and hard to pinpoint.  She is on multiple medications that can contribute to dizziness as well as paresthesias.  Recommend continuing Maxalt as needed for migraines.  Adding meclizine 25 mg 3 times daily as needed for dizziness/nausea.  Agree that neurology is probably the safest bet at this point and will have the most information to help pinpoint her symptoms.  She already has a referral in place via pain management however she would like to have another referral placed today, citing insurance concerns.  In the meantime, recommend increasing water intake and working to stay well-hydrated. - Ambulatory referral to Neurology  4. Nausea And meclizine 3 times daily as needed. - meclizine (ANTIVERT) 25 MG tablet; Take 1 tablet (25 mg total) by mouth 3 (three) times daily as needed for dizziness or nausea.  Dispense: 90 tablet; Refill: 1  I discussed the assessment and treatment plan with the patient. The patient was provided an opportunity to ask questions and all were answered. The patient agreed with the plan and demonstrated an understanding of the instructions.   The patient was advised to call back or seek an in-person evaluation if the symptoms worsen or if the condition fails to improve  as anticipated.  25 minutes of non-face-to-face time was provided during this encounter.  Return if symptoms worsen or fail to improve.  Clearnce Sorrel, DNP, APRN, FNP-BC [facility]

## 2022-01-01 ENCOUNTER — Ambulatory Visit: Payer: 59 | Admitting: Neurology
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
## 2022-01-14 ENCOUNTER — Ambulatory Visit (HOSPITAL_BASED_OUTPATIENT_CLINIC_OR_DEPARTMENT_OTHER): Payer: 59 | Admitting: Pain Medicine

## 2022-01-14 DIAGNOSIS — Z91199 Patient's noncompliance with other medical treatment and regimen due to unspecified reason: Secondary | ICD-10-CM

## 2022-01-14 NOTE — Patient Instructions (Signed)
____________________________________________________________________________________________  Patient Information update  To: All of our patients.  Re: Name change.  It has been made official that our current name, "[facility]"   will soon be changed to "[facility]".   The purpose of this change is to eliminate any confusion created by the concept of our practice being a "Medication Management Pain Clinic". In the past this has led to the misconception that we treat pain primarily by the use of prescription medications.  Nothing can be farther from the truth.   Understanding PAIN MANAGEMENT: To further understand what our practice does, you first have to understand that "Pain Management" is a subspecialty that requires additional training once a physician has completed their specialty training, which can be in either Anesthesia, Neurology, Psychiatry, or Physical Medicine and Rehabilitation (PMR). Each one of these contributes to the final approach taken by each physician to the management of their patient's pain. To be a "Pain Management Specialist" you must have first completed one of the specialty trainings below.  Anesthesiologists - trained in clinical pharmacology and interventional techniques such as nerve blockade and regional as well as central neuroanatomy. They are trained to block pain before, during, and after surgical interventions.  Neurologists - trained in the diagnosis and pharmacological treatment of complex neurological conditions, such as Multiple Sclerosis, Parkinson's, spinal cord injuries, and other systemic conditions that may be associated with symptoms that may include but are not limited to pain. They tend to rely primarily on the treatment of chronic pain using prescription medications.  Psychiatrist - trained in conditions affecting the psychosocial  wellbeing of patients including but not limited to depression, anxiety, schizophrenia, personality disorders, addiction, and other substance use disorders that may be associated with chronic pain. They tend to rely primarily on the treatment of chronic pain using prescription medications.   Physical Medicine and Rehabilitation (PMR) physicians, also known as physiatrists - trained to treat a wide variety of medical conditions affecting the brain, spinal cord, nerves, bones, joints, ligaments, muscles, and tendons. Their training is primarily aimed at treating patients that have suffered injuries that have caused severe physical impairment. Their training is primarily aimed at the physical therapy and rehabilitation of those patients. They may also work alongside orthopedic surgeons or neurosurgeons using their expertise in assisting surgical patients to recover after their surgeries.  INTERVENTIONAL PAIN MANAGEMENT is sub-subspecialty of Pain Management.  Our physicians are Board-certified in Anesthesia, Pain Management, and Interventional Pain Management.  This meaning that not only have they been trained and Board-certified in their specialty of Anesthesia, and subspecialty of Pain Management, but they have also received further training in the sub-subspecialty of Interventional Pain Management, in order to become Board-certified as INTERVENTIONAL PAIN MANAGEMENT SPECIALIST.    Mission: Our goal is to use our skills in  [place] as alternatives to the chronic use of prescription opioid medications for the treatment of pain. To make this more clear, we have changed our name to reflect what we do and offer. We will continue to offer medication management assessment and recommendations, but we will not be taking over any patient's medication management.  ____________________________________________________________________________________________      _______________________________________________________________________  Medication Rules  Purpose: To inform patients, and their family members, of our medication rules and regulations.  Applies to: All patients receiving prescriptions from our practice (written or electronic).  Pharmacy of record: This is the pharmacy where your electronic prescriptions  will be sent. Make sure we have the correct one.  Electronic prescriptions: In compliance with the [place] (STOP) Act of 2017 (Session Lanny Cramp [id number]), effective February 11, 2018, all controlled substances must be electronically prescribed. Written prescriptions, faxing, or calling prescriptions to a pharmacy will no longer be done.  Prescription refills: These will be provided only during in-person appointments. No medications will be renewed without a "face-to-face" evaluation with your provider. Applies to all prescriptions.  NOTE: The following applies primarily to controlled substances (Opioid* Pain Medications).   Type of encounter (visit): For patients receiving controlled substances, face-to-face visits are required. (Not an option and not up to the patient.)  Patient's responsibilities: Pain Pills: Bring all pain pills to every appointment (except for procedure appointments). Pill Bottles: Bring pills in original pharmacy bottle. Bring bottle, even if empty. Always bring the bottle of the most recent fill.  Medication refills: You are responsible for knowing and keeping track of what medications you are taking and when is it that you will need a refill. The day before your appointment: write a list of all prescriptions that need to be refilled. The day of the appointment: give the list to the admitting nurse. Prescriptions will be written only during appointments. No prescriptions will be written on procedure days. If you forget a medication: it will not be "Called in", "Faxed", or  "electronically sent". You will need to get another appointment to get these prescribed. No early refills. Do not call asking to have your prescription filled early. Partial  or short prescriptions: Occasionally your pharmacy may not have enough pills to fill your prescription.  NEVER ACCEPT a partial fill or a prescription that is short of the total amount of pills that you were prescribed.  With controlled substances the law allows 72 hours for the pharmacy to complete the prescription.  If the prescription is not completed within 72 hours, the pharmacist will require a new prescription to be written. This means that you will be short on your medicine and we WILL NOT send another prescription to complete your original prescription.  Instead, request the pharmacy to send a carrier to a nearby branch to get enough medication to provide you with your full prescription. Prescription Accuracy: You are responsible for carefully inspecting your prescriptions before leaving our office. Have the discharge nurse carefully go over each prescription with you, before taking them home. Make sure that your name is accurately spelled, that your address is correct. Check the name and dose of your medication to make sure it is accurate. Check the number of pills, and the written instructions to make sure they are clear and accurate. Make sure that you are given enough medication to last until your next medication refill appointment. Taking Medication: Take medication as prescribed. When it comes to controlled substances, taking less pills or less frequently than prescribed is permitted and encouraged. Never take more pills than instructed. Never take the medication more frequently than prescribed.  Inform other Doctors: Always inform, all of your healthcare providers, of all the medications you take. Pain Medication from other Providers: You are not allowed to accept any additional pain medication from any other Doctor or  Healthcare provider. There are two exceptions to this rule. (see below) In the event that you require additional pain medication, you are responsible for notifying us, as stated below. Cough Medicine: Often these contain an opioid, such as codeine or hydrocodone. Never accept or take cough medicine containing  these opioids if you are already taking an opioid* medication. The combination may cause respiratory failure and death. Medication Agreement: You are responsible for carefully reading and following our Medication Agreement. This must be signed before receiving any prescriptions from our practice. Safely store a copy of your signed Agreement. Violations to the Agreement will result in no further prescriptions. (Additional copies of our Medication Agreement are available upon request.) Laws, Rules, & Regulations: All patients are expected to follow all Federal and [employer], Rules, [employer]. Ignorance of the Laws does not constitute a valid excuse.  Illegal drugs and Controlled Substances: The use of illegal substances (including, but not limited to marijuana and its derivatives) and/or the illegal use of any controlled substances is strictly prohibited. Violation of this rule may result in the immediate and permanent discontinuation of any and all prescriptions being written by our practice. The use of any illegal substances is prohibited. Adopted CDC guidelines & recommendations: Target dosing levels will be at or below 60 MME/day. Use of benzodiazepines** is not recommended.  Exceptions: There are only two exceptions to the rule of not receiving pain medications from other Healthcare Providers. Exception #1 (Emergencies): In the event of an emergency (i.e.: accident requiring emergency care), you are allowed to receive additional pain medication. However, you are responsible for: As soon as you are able, call our office [phone], at any time of the day or night, and leave a  message stating your name, the date and nature of the emergency, and the name and dose of the medication prescribed. In the event that your call is answered by a member of our staff, make sure to document and save the date, time, and the name of the person that took your information.  Exception #2 (Planned Surgery): In the event that you are scheduled by another doctor or dentist to have any type of surgery or procedure, you are allowed (for a period no longer than 30 days), to receive additional pain medication, for the acute post-op pain. However, in this case, you are responsible for picking up a copy of our "Post-op Pain Management for Surgeons" handout, and giving it to your surgeon or dentist. This document is available at our office, and does not require an appointment to obtain it. Simply go to our office during business hours (Monday-Thursday from 8:00 AM to 4:00 PM) (Friday 8:00 AM to 12:00 Noon) or if you have a scheduled appointment with [place], prior to your surgery, and ask for it by name. In addition, you are responsible for: calling our office [phone], at any time of the day or night, and leaving a message stating your name, name of your surgeon, type of surgery, and date of procedure or surgery. Failure to comply with your responsibilities may result in termination of therapy involving the controlled substances. Medication Agreement Violation. Following the above rules, including your responsibilities will help you in avoiding a Medication Agreement Violation ("Breaking your Pain Medication Contract").  Consequences:  Not following the above rules may result in permanent discontinuation of medication prescription therapy.  *Opioid medications include: morphine, codeine, oxycodone, oxymorphone, hydrocodone, hydromorphone, meperidine, tramadol, tapentadol, buprenorphine, fentanyl, methadone. **Benzodiazepine medications include: diazepam (Valium), alprazolam (Xanax), clonazepam (Klonopine),  lorazepam (Ativan), clorazepate (Tranxene), chlordiazepoxide (Librium), estazolam (Prosom), oxazepam (Serax), temazepam (Restoril), triazolam (Halcion) (Last updated: 12/04/2021) ______________________________________________________________________    ______________________________________________________________________  Medication Recommendations and Reminders  Applies to: All patients receiving prescriptions (written and/or electronic).  Medication Rules & Regulations: You are responsible  for reading, knowing, and following our "Medication Rules" document. These exist for your safety and that of others. They are not flexible and neither are we. Dismissing or ignoring them is an act of "non-compliance" that may result in complete and irreversible termination of such medication therapy. For safety reasons, "non-compliance" will not be tolerated. As with the [place] fundamental legal principle of "ignorance of the law is no defense", we will accept no excuses for not having read and knowing the content of documents provided to you by our practice.  Pharmacy of record:  Definition: This is the pharmacy where your electronic prescriptions will be sent.  We do not endorse any particular pharmacy. It is up to you and your insurance to decide what pharmacy to use.  We do not restrict you in your choice of pharmacy. However, once we write for your prescriptions, we will NOT be re-sending more prescriptions to fix restricted supply problems created by your pharmacy, or your insurance.  The pharmacy listed in the electronic medical record should be the one where you want electronic prescriptions to be sent. If you choose to change pharmacy, simply notify our nursing staff. Changes will be made only during your regular appointments and not over the phone.  Recommendations: Keep all of your pain medications in a safe place, under lock and key, even if you live alone. We will NOT replace lost, stolen, or  damaged medication. We do not accept "Police Reports" as proof of medications having been stolen. After you fill your prescription, take 1 week's worth of pills and put them away in a safe place. You should keep a separate, properly labeled bottle for this purpose. The remainder should be kept in the original bottle. Use this as your primary supply, until it runs out. Once it's gone, then you know that you have 1 week's worth of medicine, and it is time to come in for a prescription refill. If you do this correctly, it is unlikely that you will ever run out of medicine. To make sure that the above recommendation works, it is very important that you make sure your medication refill appointments are scheduled at least 1 week before you run out of medicine. To do this in an effective manner, make sure that you do not leave the office without scheduling your next medication management appointment. Always ask the nursing staff to show you in your prescription , when your medication will be running out. Then arrange for the receptionist to get you a return appointment, at least 7 days before you run out of medicine. Do not wait until you have 1 or 2 pills left, to come in. This is very poor planning and does not take into consideration that we may need to cancel appointments due to bad weather, sickness, or emergencies affecting our staff. DO NOT ACCEPT A "Partial Fill": If for any reason your pharmacy does not have enough pills/tablets to completely fill or refill your prescription, do not allow for a "partial fill". The law allows the pharmacy to complete that prescription within 72 hours, without requiring a new prescription. If they do not fill the rest of your prescription within those 72 hours, you will need a separate prescription to fill the remaining amount, which we will NOT provide. If the reason for the partial fill is your insurance, you will need to talk to the pharmacist about payment alternatives for  the remaining tablets, but again, DO NOT ACCEPT A PARTIAL FILL, unless you can trust  your pharmacist to obtain the remainder of the pills within 72 hours.  Prescription refills and/or changes in medication(s):  Prescription refills, and/or changes in dose or medication, will be conducted only during scheduled medication management appointments. (Applies to both, written and electronic prescriptions.) No refills on procedure days. No medication will be changed or started on procedure days. No changes, adjustments, and/or refills will be conducted on a procedure day. Doing so will interfere with the diagnostic portion of the procedure. No phone refills. No medications will be "called into the pharmacy". No Fax refills. No weekend refills. No Holliday refills. No after hours refills.  Remember:  Business hours are:  Monday to Thursday 8:00 AM to 4:00 PM Provider's Schedule: Milinda Pointer, MD - Appointments are:  Medication management: Monday and Wednesday 8:00 AM to 4:00 PM Procedure day: Tuesday and Thursday 7:30 AM to 4:00 PM Gillis Santa, MD - Appointments are:  Medication management: Tuesday and Thursday 8:00 AM to 4:00 PM Procedure day: Monday and Wednesday 7:30 AM to 4:00 PM (Last update: 12/04/2021) ______________________________________________________________________    ____________________________________________________________________________________________  Pharmacy Shortages of Pain Medication   Introduction Shockingly as it may seem, .  "No U.S. Supreme Court decision has ever interpreted the Constitution as guaranteeing a right to health care for all Americans." - https://huff.com/  "With respect to human rights, the [place] has no formally codified right to health, nor does it participate in a human rights treaty that specifies a right to health." - Scott J. Schweikart, JD,  MBE  Situation By now, most of our patients have had the experience of being told by their pharmacist that they do not have enough medication to cover their prescription. If you have not had this experience, just know that you soon will.  Problem There appears to be a shortage of these medications, either at the national level or locally. This is happening with all pharmacies. When there is not enough medication, patients are offered a partial fill and they are told that they will try to get the rest of the medicine for them at a later time. If they do not have enough for even a partial fill, the pharmacists are telling the patients to call us (the prescribing physicians) to request that we send another prescription to another pharmacy to get the medicine.   This reordering of a controlled substance creates documentation problems where additional paperwork needs to be created to explain why two prescriptions for the same period of time and the same medicine are being prescribed to the same patient. It also creates situations where the last appointment note does not accurately reflect when and what prescriptions were given to a patient. This leads to prescribing errors down the line, in subsequent follow-up visits.   [employer] of Pharmacy ([employer]) Research revealed that Surveyor, quantity .1806 (21 NCAC 46.1806) authorizes pharmacists to the transfer of prescriptions among pharmacies, and it sets forth procedural and recordkeeping requirements for doing so. However, this requires the pharmacist to complete the previously mentioned procedural paperwork to accomplish the transfer. As it turns out, it is much easier for them to have the prescribing physicians do the work.   Possible solutions 1. You can ask your physician to assist you in weaning yourself off these medications. 2. Ask your pharmacy if the medication is in stock, 3 days prior to your refill. 3. If you need a pharmacy change,  let us know at your medication management visit. Prescriptions that have already been  electronically sent to a pharmacy will not be re-sent to a different pharmacy if your pharmacy of record does not have it in stock. Proper stocking of medication is a pharmacy problem, not a prescriber problem. Work with your pharmacist to solve the problem. 4. Have the [facility] Assembly add a provision to the "STOP ACT" (the law that mandates how controlled substances are prescribed) where there is an exception to the electronic prescribing rule that states that in the event there are shortages of medications the physicians are allowed to use written prescriptions as opposed to electronic ones. This would allow patients to take their prescriptions to a different pharmacy that may have enough medication available to fill the prescription. The problem is that currently there is a law that does not allow for written prescriptions, with the exception of instances where the electronic medical record is down due to technical issues.  5. Have [place] Congress ease the pressure on pharmaceutical companies, allowing them to produce enough quantities of the medication to adequately supply the population. 6. Have pharmacies keep enough stocks of these medications to cover their client base.  7. Have the [facility] Assembly add a provision to the "STOP ACT" where they ease the regulations surrounding the transfer of controlled substances between pharmacies, so as to simplify the transfer of supplies. As an alternative, develop a system to allow patients to obtain the remainder of their prescription at another one of their pharmacies or at an associate pharmacy.   How this shortage will affect you.  Understand that this is a pharmacy supply problem, not a prescriber problem. Work with your pharmacy to solve it. The job of the prescriber is to evaluate and monitor the patient for the appropriate indications and use of  these medicines. It is not the job of the prescriber to supply the medication or to solve problems with that supply. The responsibility and the choice to obtain the medication resides on the patient. By law, supplying the medication is the job of the pharmacy. It is certainly not the job of the prescriber to solve supply problems.   Due to the above problems we are no longer taking patients to write for their pain medication. Future discussions with your physician may include potentially weaning medications or transitioning to alternatives.  We will be focusing primarily on interventional based pain management. We will continue to evaluate for appropriate indications and we may provide recommendations regarding medication, dose, and schedule, as well as monitoring recommendations, however, we will not be taking over the actual prescribing of these substances. On those patients where we are treating their chronic pain with interventional therapies, exceptions will be considered on a case by case basis. At this time, we will try to continue providing this supplemental service to those patients we have been managing in the past. However, as of August 1st, 2023, we no longer will be sending additional prescriptions to other pharmacies for the purpose of solving their supply problems. Once we send a prescription to a pharmacy, we will not be resending it again to another pharmacy to cover for their shortages.   What to do. Write as many letters as you can. Recruit the help of family members in writing these letters. Below are some of the places where you can write to make your voice heard. Let them know what the problem is and push them to look for solutions.   Search internet for: "[employer] find your legislators" NoseSwap.is  Search  internet for: "[employer] commissioner  complaints" Starlas.fi  Search internet for: "[place] complaints" https://www.hernandez-brewer.com/.htm  Search internet for: "[facility] pharmacy complaints" Email [facility] Customer Relations woondaal.com.jsp?callType=store  Search internet for: Programme researcher, broadcasting/film/video customer service complaints" https://www.[facility].com/topic/marketing/contactus/contactus_customerservice.jsp  ____________________________________________________________________________________________     ____________________________________________________________________________________________  Drug Holidays (Slow)  What is a "Drug Holiday"? Drug Holiday: is the name given to the period of time during which a patient stops taking a medication(s) for the purpose of eliminating tolerance to the drug.  Benefits Improved effectiveness of opioid medication. Decreased opioid dose needed to achieve benefits. Improved pain with lesser dose. Ending dependence on high dose therapy. Possible decrease in cost of therapy. It may uncover "opioid-induced hyperalgesia". (OIH)  What is "opioid hyperalgesia"? It is an increased paradoxical pain sensitization state caused by exposure to opioids, whereby a patient receiving opioids for the treatment of pain could actually become more sensitive to a painful stimuli. Stopping the opioid pain medication, contrary to the expected increase in the pain, it actually decreases or completely eliminates the pain. Ref.: "A comprehensive review of opioid-induced hyperalgesia". Brion Aliment, et.al. Pain Physician. 2011 Mar-Apr;14(2):145-61.  What is tolerance? Tolerance: is the progressive decreased in effectiveness of a drug due to its repetitive use. With repetitive use, the body gets use to the medication and as a consequence, it loses its effectiveness. This is a common problem  seen with opioid pain medications. As a result, a larger dose of the drug is needed to achieve the same effect that used to be obtained with a smaller dose.  How long should a "Drug Holiday" last? Effectiveness depends on the patient staying off all opioid pain medicines for a minimum of 14 consecutive days. (2 weeks)  How about just taking less of the medicine? Does not work. This will not eliminate the excess receptors.  How about switching to a different pain medicine? (AKA. "Opioid rotation") This "technique" was promoted by studies funded by [employer], such as [employer], creators of "OxyContin". It does not work. It only creates the illusion of effectiveness by taking advantage of inaccurate equivalent dose calculations between different opioid medications.   Should I stop the medicine "cold [place]"? No. You should always coordinate with your Pain Specialist so that he/she can provide you with the correct medication dose to make the transition as smoothly as possible.  How do I stop the medicine? Slowly. You will be instructed to decrease the daily amount of pills that you take by one (1) pill every seven (7) days. This is called a "slow downward taper" of your dose. For example: if you normally take four (4) pills per day, you will be asked to drop this dose to three (3) pills per day for seven (7) days, then to two (2) pills per day for seven (7) days, then to one (1) per day for seven (7) days, and at the end of those last seven (7) days, this is when the "Drug Holiday" would start.   How about withdrawals? Typically, what triggers withdrawals is the sudden stop of a high dose opioid medication. Withdrawals can usually be avoided by slowly decreasing the dose over a prolonged period of time. If you attempt to stop your medication abruptly, withdrawals may be possible.  What are withdrawals? Withdrawals: refers to the wide range of symptoms that occur after stopping or  dramatically reducing opiate drugs after heavy and prolonged use. Withdrawal symptoms do not occur to patients that use low dose opioids,  or those who take the medication sporadically. Contrary to benzodiazepine (example: Valium, Xanax, etc.) or alcohol withdrawals ("Delirium Tremens"), opioid withdrawals are not lethal. Withdrawals are the physical manifestation of the body getting rid of the excess receptors.  Withdrawal Symptoms Early symptoms of withdrawal may include: Agitation Anxiety Muscle aches Increased tearing Insomnia Runny nose Sweating Yawning  Late symptoms of withdrawal may include: Abdominal cramping Diarrhea Dilated pupils Goose bumps Nausea Vomiting  Will I experience withdrawals? Due to the slow nature of the taper, it is very unlikely.  What is a slow taper? Taper: refers to the gradual decrease in dose.  (Last update: 01/07/2022) ____________________________________________________________________________________________    ____________________________________________________________________________________________  CBD (cannabidiol) & Delta-8 (Delta-8 tetrahydrocannabinol) WARNING  Intro: Cannabidiol (CBD) and tetrahydrocannabinol (THC), are two natural compounds found in plants of the Cannabis genus. They can both be extracted from hemp or cannabis. Hemp and cannabis come from the Cannabis sativa plant. Both compounds interact with your body's endocannabinoid system, but they have very different effects. CBD does not produce the high sensation associated with cannabis. Delta-8 tetrahydrocannabinol, also known as delta-8 THC, is a psychoactive substance found in the Cannabis sativa plant, of which marijuana and hemp are two varieties. THC is responsible for the high associated with the illicit use of marijuana.  Applicable to: All individuals currently taking or considering taking CBD (cannabidiol) and, more important, all patients taking opioid analgesic  controlled substances (pain medication). (Example: oxycodone; oxymorphone; hydrocodone; hydromorphone; morphine; methadone; tramadol; tapentadol; fentanyl; buprenorphine; butorphanol; dextromethorphan; meperidine; codeine; etc.)  Legal status: CBD remains a Schedule I drug prohibited for any use. CBD is illegal with one exception. In the [place], CBD has a limited Transport planner (FDA) approval for the treatment of two specific types of epilepsy disorders. Only one CBD product has been approved by the FDA for this purpose: "Epidiolex". FDA is aware that some companies are marketing products containing cannabis and cannabis-derived compounds in ways that violate the [employer], Drug and Cosmetic Act [facility] Act) and that may put the health and safety of consumers at risk. The FDA, a Federal agency, has not enforced the CBD status since 2018. UPDATE: (03/30/2021) The Drug Enforcement Agency ([place]) issued a letter stating that "delta" cannabinoids, including Delta-8-THCO and Delta-9-THCO, synthetically derived from hemp do not qualify as hemp and will be viewed as Schedule I drugs. (Schedule I drugs, substances, or chemicals are defined as drugs with no currently accepted medical use and a high potential for abuse. Some examples of Schedule I drugs are: heroin, lysergic acid diethylamide (LSD), marijuana (cannabis), 3,4-methylenedioxymethamphetamine (ecstasy), methaqualone, and peyote.) (https://jennings.com/)  Legality: Some manufacturers ship CBD products nationally, which is illegal. Often such products are sold online and are therefore available throughout the country. CBD is openly sold in head shops and health food stores in some states where such sales have not been explicitly legalized. Selling unapproved products with unsubstantiated therapeutic claims is not only a violation of the law, but also can put patients at risk, as these products have not been proven to be safe or effective.  Federal illegality makes it difficult to conduct research on CBD.  Reference: "FDA Regulation of Cannabis and Cannabis-Derived Products, Including Cannabidiol (CBD)" - [employer]ekArt[employer].pt  Warning: CBD is not FDA approved and has not undergo the same manufacturing controls as prescription drugs.  This means that the purity and safety of available CBD may be questionable. Most of the time, despite manufacturer's claims, it is contaminated with THC (delta-9-tetrahydrocannabinol - the chemical  in marijuana responsible for the "HIGH").  When this is the case, the [facility] contaminant will trigger a positive urine drug screen (UDS) test for Marijuana (carboxy-THC). Because a positive UDS for any illicit substance is a violation of our medication agreement, your opioid analgesics (pain medicine) may be permanently discontinued. The FDA recently put out a warning about 5 things that everyone should be aware of regarding Delta-8 THC: Delta-8 THC products have not been evaluated or approved by the FDA for safe use and may be marketed in ways that put the public health at risk. The FDA has received adverse event reports involving delta-8 THC-containing products. Delta-8 THC has psychoactive and intoxicating effects. Delta-8 THC manufacturing often involve use of potentially harmful chemicals to create the concentrations of delta-8 THC claimed in the marketplace. The final delta-8 THC product may have potentially harmful by-products (contaminants) due to the chemicals used in the process. Manufacturing of delta-8 THC products may occur in uncontrolled or unsanitary settings, which may lead to the presence of unsafe contaminants or other potentially harmful substances. Delta-8 THC products should be kept out of the reach of children and pets.  MORE ABOUT CBD  General Information: CBD was discovered in [birth year]  and it is a derivative of the cannabis sativa genus plants (Marijuana and Hemp). It is one of the 113 identified substances found in Marijuana. It accounts for up to 40% of the plant's extract. As of 2018, preliminary clinical studies on CBD included research for the treatment of anxiety, movement disorders, and pain. CBD is available and consumed in multiple forms, including inhalation of smoke or vapor, as an aerosol spray, and by mouth. It may be supplied as an oil containing CBD, capsules, dried cannabis, or as a liquid solution. CBD is thought not to be as psychoactive as THC (delta-9-tetrahydrocannabinol - the chemical in marijuana responsible for the "HIGH"). Studies suggest that CBD may interact with different biological target receptors in the body, including cannabinoid and other neurotransmitter receptors. As of 2018 the mechanism of action for its biological effects has not been determined.  Side-effects  Adverse reactions: Dry mouth, diarrhea, decreased appetite, fatigue, drowsiness, malaise, weakness, sleep disturbances, and others.  Drug interactions: CBC may interact with other medications such as blood-thinners. Because CBD causes drowsiness on its own, it also increases the drowsiness caused by other medications, including antihistamines (such as Benadryl), benzodiazepines (Xanax, Ativan, Valium), antipsychotics, antidepressants and opioids, as well as alcohol and supplements such as kava, melatonin and St. John's Wort. Be cautious with the following combinations:   Brivaracetam (Briviact) Brivaracetam is changed and broken down by the body. CBD might decrease how quickly the body breaks down brivaracetam. This might increase levels of brivaracetam in the body.  Caffeine Caffeine is changed and broken down by the body. CBD might decrease how quickly the body breaks down caffeine. This might increase levels of caffeine in the body.  Carbamazepine (Tegretol) Carbamazepine is changed and  broken down by the body. CBD might decrease how quickly the body breaks down carbamazepine. This might increase levels of carbamazepine in the body and increase its side effects.  Citalopram (Celexa) Citalopram is changed and broken down by the body. CBD might decrease how quickly the body breaks down citalopram. This might increase levels of citalopram in the body and increase its side effects.  Clobazam (Onfi) Clobazam is changed and broken down by the liver. CBD might decrease how quickly the liver breaks down clobazam. This might increase the effects and side effects  of clobazam.  Eslicarbazepine (Aptiom) Eslicarbazepine is changed and broken down by the body. CBD might decrease how quickly the body breaks down eslicarbazepine. This might increase levels of eslicarbazepine in the body by a small amount.  Everolimus (Zostress) Everolimus is changed and broken down by the body. CBD might decrease how quickly the body breaks down everolimus. This might increase levels of everolimus in the body.  Lithium Taking higher doses of CBD might increase levels of lithium. This can increase the risk of lithium toxicity.  Medications changed by the liver (Cytochrome P450 1A1 (CYP1A1) substrates) Some medications are changed and broken down by the liver. CBD might change how quickly the liver breaks down these medications. This could change the effects and side effects of these medications.  Medications changed by the liver (Cytochrome P450 1A2 (CYP1A2) substrates) Some medications are changed and broken down by the liver. CBD might change how quickly the liver breaks down these medications. This could change the effects and side effects of these medications.  Medications changed by the liver (Cytochrome P450 1B1 (CYP1B1) substrates) Some medications are changed and broken down by the liver. CBD might change how quickly the liver breaks down these medications. This could change the effects and side  effects of these medications.  Medications changed by the liver (Cytochrome P450 2A6 (CYP2A6) substrates) Some medications are changed and broken down by the liver. CBD might change how quickly the liver breaks down these medications. This could change the effects and side effects of these medications.  Medications changed by the liver (Cytochrome P450 2B6 (CYP2B6) substrates) Some medications are changed and broken down by the liver. CBD might change how quickly the liver breaks down these medications. This could change the effects and side effects of these medications.  Medications changed by the liver (Cytochrome P450 2C19 (CYP2C19) substrates) Some medications are changed and broken down by the liver. CBD might change how quickly the liver breaks down these medications. This could change the effects and side effects of these medications.  Medications changed by the liver (Cytochrome P450 2C8 (CYP2C8) substrates) Some medications are changed and broken down by the liver. CBD might change how quickly the liver breaks down these medications. This could change the effects and side effects of these medications.  Medications changed by the liver (Cytochrome P450 2C9 (CYP2C9) substrates) Some medications are changed and broken down by the liver. CBD might change how quickly the liver breaks down these medications. This could change the effects and side effects of these medications.  Medications changed by the liver (Cytochrome P450 2D6 (CYP2D6) substrates) Some medications are changed and broken down by the liver. CBD might change how quickly the liver breaks down these medications. This could change the effects and side effects of these medications.  Medications changed by the liver (Cytochrome P450 2E1 (CYP2E1) substrates) Some medications are changed and broken down by the liver. CBD might change how quickly the liver breaks down these medications. This could change the effects and side effects  of these medications.  Medications changed by the liver (Cytochrome P450 3A4 (CYP3A4) substrates) Some medications are changed and broken down by the liver. CBD might change how quickly the liver breaks down these medications. This could change the effects and side effects of these medications.  Medications changed by the liver (Glucuronidated drugs) Some medications are changed and broken down by the liver. CBD might change how quickly the liver breaks down these medications. This could change the effects and side effects  of these medications.  Medications that decrease the breakdown of other medications by the liver (Cytochrome P450 2C19 (CYP2C19) inhibitors) CBD is changed and broken down by the liver. Some drugs decrease how quickly the liver changes and breaks down CBD. This could change the effects and side effects of CBD.  Medications that decrease the breakdown of other medications in the liver (Cytochrome P450 3A4 (CYP3A4) inhibitors) CBD is changed and broken down by the liver. Some drugs decrease how quickly the liver changes and breaks down CBD. This could change the effects and side effects of CBD.  Medications that increase breakdown of other medications by the liver (Cytochrome P450 3A4 (CYP3A4) inducers) CBD is changed and broken down by the liver. Some drugs increase how quickly the liver changes and breaks down CBD. This could change the effects and side effects of CBD.  Medications that increase the breakdown of other medications by the liver (Cytochrome P450 2C19 (CYP2C19) inducers) CBD is changed and broken down by the liver. Some drugs increase how quickly the liver changes and breaks down CBD. This could change the effects and side effects of CBD.  Methadone (Dolophine) Methadone is broken down by the liver. CBD might decrease how quickly the liver breaks down methadone. Taking cannabidiol along with methadone might increase the effects and side effects of  methadone.  Rufinamide (Banzel) Rufinamide is changed and broken down by the body. CBD might decrease how quickly the body breaks down rufinamide. This might increase levels of rufinamide in the body by a small amount.  Sedative medications (CNS depressants) CBD might cause sleepiness and slowed breathing. Some medications, called sedatives, can also cause sleepiness and slowed breathing. Taking CBD with sedative medications might cause breathing problems and/or too much sleepiness.  Sirolimus (Rapamune) Sirolimus is changed and broken down by the body. CBD might decrease how quickly the body breaks down sirolimus. This might increase levels of sirolimus in the body.  Stiripentol (Diacomit) Stiripentol is changed and broken down by the body. CBD might decrease how quickly the body breaks down stiripentol. This might increase levels of stiripentol in the body and increase its side effects.  Tacrolimus (Prograf) Tacrolimus is changed and broken down by the body. CBD might decrease how quickly the body breaks down tacrolimus. This might increase levels of tacrolimus in the body.  Tamoxifen (Soltamox) Tamoxifen is changed and broken down by the body. CBD might affect how quickly the body breaks down tamoxifen. This might affect levels of tamoxifen in the body.  Topiramate (Topamax) Topiramate is changed and broken down by the body. CBD might decrease how quickly the body breaks down topiramate. This might increase levels of topiramate in the body by a small amount.  Valproate Valproic acid can cause liver injury. Taking cannabidiol with valproic acid might increase the chance of liver injury. CBD and/or valproic acid might need to be stopped, or the dose might need to be reduced.  Warfarin (Coumadin) CBD might increase levels of warfarin, which can increase the risk for bleeding. CBD and/or warfarin might need to be stopped, or the dose might need to be reduced.  Zonisamide Zonisamide is  changed and broken down by the body. CBD might decrease how quickly the body breaks down zonisamide. This might increase levels of zonisamide in the body by a small amount. (Last update: 04/11/2021) ____________________________________________________________________________________________   ____________________________________________________________________________________________  Naloxone Nasal Spray  Why am I receiving this medication? [place] STOP ACT requires that all patients taking high dose opioids or at  risk of opioids respiratory depression, be prescribed an opioid reversal agent, such as Naloxone (AKA: Narcan).  What is this medication? NALOXONE (nal OX one) treats opioid overdose, which causes slow or shallow breathing, severe drowsiness, or trouble staying awake. Call emergency services after using this medication. You may need additional treatment. Naloxone works by reversing the effects of opioids. It belongs to a group of medications called opioid blockers.  COMMON BRAND NAME(S): Kloxxado, Narcan  What should I tell my care team before I take this medication? They need to know if you have any of these conditions: Heart disease Substance use disorder An unusual or allergic reaction to naloxone, other medications, foods, dyes, or preservatives Pregnant or trying to get pregnant Breast-feeding  When to use this medication? This medication is to be used for the treatment of respiratory depression (less than 8 breaths per minute) secondary to opioid overdose.   How to use this medication? This medication is for use in the nose. Lay the person on their back. Support their neck with your hand and allow the head to tilt back before giving the medication. The nasal spray should be given into 1 nostril. After giving the medication, move the person onto their side. Do not remove or test the nasal spray until ready to use. Get emergency medical help right away after giving  the first dose of this medication, even if the person wakes up. You should be familiar with how to recognize the signs and symptoms of a narcotic overdose. If more doses are needed, give the additional dose in the other nostril. Talk to your care team about the use of this medication in children. While this medication may be prescribed for children as young as newborns for selected conditions, precautions do apply.  Naloxone Overdosage: If you think you have taken too much of this medicine contact a poison control center or emergency room at once.  NOTE: This medicine is only for you. Do not share this medicine with others.  What if I miss a dose? This does not apply.  What may interact with this medication? This is only used during an emergency. No interactions are expected during emergency use. This list may not describe all possible interactions. Give your health care provider a list of all the medicines, herbs, non-prescription drugs, or dietary supplements you use. Also tell them if you smoke, drink alcohol, or use illegal drugs. Some items may interact with your medicine.  What should I watch for while using this medication? Keep this medication ready for use in the case of an opioid overdose. Make sure that you have the phone number of your care team and local hospital ready. You may need to have additional doses of this medication. Each nasal spray contains a single dose. Some emergencies may require additional doses. After use, bring the treated person to the nearest hospital or call 911. Make sure the treating care team knows that the person has received a dose of this medication. You will receive additional instructions on what to do during and after use of this medication before an emergency occurs.  What side effects may I notice from receiving this medication? Side effects that you should report to your care team as soon as possible: Allergic reactions--skin rash, itching, hives,  swelling of the face, lips, tongue, or throat Side effects that usually do not require medical attention (report these to your care team if they continue or are bothersome): Constipation Dryness or irritation inside the nose Headache  Increase in blood pressure Muscle spasms Stuffy nose Toothache This list may not describe all possible side effects. Call your doctor for medical advice about side effects. You may report side effects to FDA at 1-800-FDA-1088.  Where should I keep my medication? Because this is an emergency medication, you should keep it with you at all times.  Keep out of the reach of children and pets. Store between 20 and 25 degrees C (68 and 77 degrees F). Do not freeze. Throw away any unused medication after the expiration date. Keep in original box until ready to use.  NOTE: This sheet is a summary. It may not cover all possible information. If you have questions about this medicine, talk to your doctor, pharmacist, or health care provider.   2023 Elsevier/Gold Standard (2020-10-06 00:00:00)  ____________________________________________________________________________________________

## 2022-01-23 ENCOUNTER — Ambulatory Visit: Payer: 59 | Attending: Pain Medicine | Admitting: Pain Medicine

## 2022-01-23 ENCOUNTER — Encounter: Payer: Self-pay | Admitting: Pain Medicine

## 2022-01-23 VITALS — BP 116/85 | HR 95 | Temp 98.0°F | Ht 64.0 in | Wt 265.0 lb

## 2022-01-23 DIAGNOSIS — Z969 Presence of functional implant, unspecified: Secondary | ICD-10-CM | POA: Diagnosis present

## 2022-01-23 DIAGNOSIS — M79602 Pain in left arm: Secondary | ICD-10-CM | POA: Diagnosis present

## 2022-01-23 DIAGNOSIS — M5459 Other low back pain: Secondary | ICD-10-CM | POA: Diagnosis present

## 2022-01-23 DIAGNOSIS — G894 Chronic pain syndrome: Secondary | ICD-10-CM | POA: Diagnosis not present

## 2022-01-23 DIAGNOSIS — M797 Fibromyalgia: Secondary | ICD-10-CM

## 2022-01-23 DIAGNOSIS — Z79891 Long term (current) use of opiate analgesic: Secondary | ICD-10-CM | POA: Diagnosis present

## 2022-01-23 DIAGNOSIS — Z79899 Other long term (current) drug therapy: Secondary | ICD-10-CM

## 2022-01-23 DIAGNOSIS — M47816 Spondylosis without myelopathy or radiculopathy, lumbar region: Secondary | ICD-10-CM

## 2022-01-23 DIAGNOSIS — M25552 Pain in left hip: Secondary | ICD-10-CM | POA: Insufficient documentation

## 2022-01-23 DIAGNOSIS — M542 Cervicalgia: Secondary | ICD-10-CM | POA: Insufficient documentation

## 2022-01-23 DIAGNOSIS — M79601 Pain in right arm: Secondary | ICD-10-CM | POA: Diagnosis not present

## 2022-01-23 DIAGNOSIS — M25551 Pain in right hip: Secondary | ICD-10-CM | POA: Insufficient documentation

## 2022-01-23 DIAGNOSIS — G8929 Other chronic pain: Secondary | ICD-10-CM

## 2022-01-23 MED ORDER — NALOXONE HCL 4 MG/0.1ML NA LIQD: 1.0000 | NASAL | 0 refills | Status: DC | PRN

## 2022-01-23 MED ORDER — OXYCODONE HCL 5 MG PO TABS: 5.0000 mg | ORAL_TABLET | Freq: Four times a day (QID) | ORAL | 0 refills | Status: DC | PRN

## 2022-01-23 NOTE — Progress Notes (Signed)
Nursing Pain Medication Assessment:  Safety precautions to be maintained throughout the outpatient stay will include: orient to surroundings, keep bed in low position, maintain call bell within reach at all times, provide assistance with transfer out of bed and ambulation.  Medication Inspection Compliance: Pill count conducted under aseptic conditions, in front of the patient. Neither the pills nor the bottle was removed from the patient's sight at any time. Once count was completed pills were immediately returned to the patient in their original bottle.  Medication: Oxycodone IR Pill/Patch Count:  88 of 120 pills remain Pill/Patch Appearance: Markings consistent with prescribed medication Bottle Appearance: Standard pharmacy container. Clearly labeled. Filled Date: 11 / 01 / 2023 Last Medication intake:  TodaySafety precautions to be maintained throughout the outpatient stay will include: orient to surroundings, keep bed in low position, maintain call bell within reach at all times, provide assistance with transfer out of bed and ambulation.

## 2022-01-23 NOTE — Progress Notes (Signed)
PROVIDER NOTE: Information contained herein reflects review and annotations entered in association with encounter. Interpretation of such information and data should be left to medically-trained personnel. Information provided to patient can be located elsewhere in the medical record under "Patient Instructions". Document created using STT-dictation technology, any transcriptional errors that may result from process are unintentional.    Patient: Mary Cruz  Service Category: E/M  Provider: Gaspar Cola, MD  DOB: 02-Mar-1975  DOS: 01/23/2022  Referring Provider: Samuel Bouche, NP  MRN: [id number]  Specialty: Interventional Pain Management  PCP: Samuel Bouche, NP  Type: Established Patient  Setting: Ambulatory outpatient    Location: Office  Delivery: Face-to-face     HPI  [name], a 46 y.o. year old female, is here today because of her Chronic pain syndrome [G89.4]. Ms. Cozby primary complain today is Back Pain (Lower-more on right side) Last encounter: My last encounter with her was on 01/14/2022. Pertinent problems: Ms. Grant has Chronic pain syndrome; Chronic musculoskeletal pain; Osteoarthrosis; Presence of functional implant ([employer] Lumbar spinal cord stimulator implant); Chronic low back pain (Bilateral) (R>L) w/o sciatica; Chronic lumbar radicular pain (Right L5 dermatome; Left S1 Dermatome) (Bilateral) (R>L); Chronic neck pain (1ry area of Pain) (Bilateral) (L>R); Chronic cervical radicular pain (C7 Dermatome) (Bilateral) (L>R); Chronic lower extremity pain (Bilateral) (R>L); Lumbar spondylosis; Lumbar facet syndrome (Bilateral) (R>L); Neurogenic pain; Fibromyalgia; Pain in right knee; Chronic upper extremity pain (2ry area of Pain) (Bilateral) (L>R); Chronic hip pain (3ry area of Pain) (Bilateral) (R>L); Closed fracture of scapula, sequela (Right); DDD (degenerative disc disease), cervical; Cervicalgia; Abnormal MRI, shoulder (Right); Spondylosis without myelopathy or  radiculopathy, lumbosacral region; DDD (degenerative disc disease), lumbar; Osteoarthritis involving multiple joints; Chronic low back pain (Bilateral) (R>L) w/ sciatica (Bilateral) (R>L); Chronic lower extremity pain (Right); Lumbosacral radiculopathy at L5 (Right); Dropfoot (Right); Lumbar facet arthropathy; Intractable low back pain; Closed fracture of multiple ribs of right side; Chronic feet pain (Bilateral); Migraines; Chronic hip pain (Right); Osteoarthritis of hip (Right); Grade 1 Retrolisthesis of L4/L5; Spondylolisthesis at L4-L5 level; Lumbosacral radiculopathy/radiculitis at L4 (Right); Lower extremity numbness (Bilateral); Complaints of weakness of lower extremities (Bilateral); and Abnormal MRI, lumbar spine (11/04/2021) on their pertinent problem list. Pain Assessment: Severity of Chronic pain is reported as a 5 /10. Location: Back Right, Lower/radiates down right leg in to feet; toes numb. Onset:  . Quality: Constant, Numbness, Shooting, Aching, Stabbing. Timing: Constant. Modifying factor(s): walking, meds. Vitals:  height is _0  (1.626 m) and weight is 265 lb (120.2 kg). Her temporal temperature is 98 F (36.7 C). Her blood pressure is 116/85 and her pulse is 95. Her oxygen saturation is 98%.  BMI: Estimated body mass index is 45.49 kg/m as calculated from the following:   Height as of this encounter: _1  (1.626 m).   Weight as of this encounter: 265 lb (120.2 kg).  Reason for encounter: medication management.  The patient indicates doing well with the current medication regimen. No adverse reactions or side effects reported to the medications.   The patient refers that she has not had a chance to see the neurologist.  The referral that we sent was scheduled for January.  Her primary care physician sent another one to a neurologist in [place] and they will be seeing her earlier in the summer.  RTCB: 05/11/2022  Nonopioids transferred 04/06/2020: Robaxin  Pharmacotherapy  Assessment  Analgesic: Oxycodone IR 5 mg, 1 tab PO q 6 hrs (20 mg/day of oxycodone) MME/day: 30 mg/day.  Monitoring: [place] PMP: PDMP reviewed during this encounter.       Pharmacotherapy: No side-effects or adverse reactions reported. Compliance: No problems identified. Effectiveness: Clinically acceptable.  Arlice Colt, RN  01/23/2022  9:46 AM  Sign when Signing Visit Nursing Pain Medication Assessment:  Safety precautions to be maintained throughout the outpatient stay will include: orient to surroundings, keep bed in low position, maintain call bell within reach at all times, provide assistance with transfer out of bed and ambulation.  Medication Inspection Compliance: Pill count conducted under aseptic conditions, in front of the patient. Neither the pills nor the bottle was removed from the patient's sight at any time. Once count was completed pills were immediately returned to the patient in their original bottle.  Medication: Oxycodone IR Pill/Patch Count:  88 of 120 pills remain Pill/Patch Appearance: Markings consistent with prescribed medication Bottle Appearance: Standard pharmacy container. Clearly labeled. Filled Date: 47 / 01 / 2023 Last Medication intake:  TodaySafety precautions to be maintained throughout the outpatient stay will include: orient to surroundings, keep bed in low position, maintain call bell within reach at all times, provide assistance with transfer out of bed and ambulation.     No results found for: "CBDTHCR" No results found for: "D8THCCBX" No results found for: "D9THCCBX"  UDS:  Summary  Date Value Ref Range Status  10/22/2021 Note  Final    Comment:    ==================================================================== [facility] (MW) ==================================================================== Test                             Result       Flag       Units  Drug Present and Declared for Prescription Verification    Oxycodone                      178          EXPECTED   ng/mg creat   Oxymorphone                    261          EXPECTED   ng/mg creat   Noroxycodone                   1146         EXPECTED   ng/mg creat   Noroxymorphone                 143          EXPECTED   ng/mg creat    Sources of oxycodone are scheduled prescription medications.    Oxymorphone, noroxycodone, and noroxymorphone are expected    metabolites of oxycodone. Oxymorphone is also available as a    scheduled prescription medication.  ==================================================================== Test                      Result    Flag   Units      Ref Range   Creatinine              103              mg/dL      >=20 ==================================================================== Declared Medications:  The flagging and interpretation on this report are based on the  following declared medications.  Unexpected results may arise from  inaccuracies in the declared medications.   **Note: The testing scope of this panel  includes these medications:   Oxycodone (Roxicodone)   **Note: The testing scope of this panel does not include the  following reported medications:   Amitriptyline (Elavil)  Benzonatate (Tessalon)  Biotin  Duloxetine (Cymbalta)  Hydrochlorothiazide (Zestoretic)  Hyoscyamine (Levsin)  Levocetirizine (Xyzal)  Lisinopril (Zestoretic)  Methocarbamol (Robaxin)  Pantoprazole (Protonix)  Probiotic  Rizatriptan (Maxalt)  Topiramate (Topamax) ==================================================================== For clinical consultation, please call [phone]. ====================================================================       ROS  Constitutional: Denies any fever or chills Gastrointestinal: No reported hemesis, hematochezia, vomiting, or acute GI distress Musculoskeletal: Denies any acute onset joint swelling, redness, loss of ROM, or weakness Neurological: No reported episodes  of acute onset apraxia, aphasia, dysarthria, agnosia, amnesia, paralysis, loss of coordination, or loss of consciousness  Medication Review  Biotin, DULoxetine, Probiotic Product, amitriptyline, hyoscyamine, levocetirizine, lisinopril-hydrochlorothiazide, meclizine, methocarbamol, naloxone, oxyCODONE, pantoprazole, rizatriptan, and topiramate  History Review  Allergy: Ms. Scarboro is allergic to celecoxib, doxycycline, gabapentin, latex, nabumetone, pentazocine lactate, seroquel [quetiapine], amoxicillin, bactrim [sulfamethoxazole-trimethoprim], cephalexin, erythromycin, hydromorphone hcl, and lyrica [pregabalin]. Drug: Ms. Bula  reports no history of drug use. Alcohol:  reports no history of alcohol use. Tobacco:  reports that she has been smoking cigarettes. She quit smokeless tobacco use about 4 years ago. Social: Ms. Riecke  reports that she has been smoking cigarettes. She quit smokeless tobacco use about 4 years ago. She reports that she does not drink alcohol and does not use drugs. Medical:  has a past medical history of Abscess of axillary fold (10/10/2013), Acute bronchitis with asthma with acute exacerbation (01/23/2015), Dehydration (04/19/2019), Elevated lactic acid level (04/19/2019), Encounter for management of implanted device (12/19/2014), Encounter for therapeutic drug level monitoring (12/19/2014), Fibromyalgia, GERD (gastroesophageal reflux disease), Hyperglycemia (04/19/2019), Hypertension, Implant pain at battery site (right buttocks area) (04/05/2015), KIDNEY STONES (07/02/2007), Long term current use of opiate analgesic (12/19/2014), Morbid obesity ([place]) (12/19/2014), Muscle cramping (12/19/2014), Opiate use (30 MME/Day) (12/19/2014), Osteoarthritis, Pain, Right knee pain (01/10/2015), Sepsis ([place]) (04/19/2019), Sepsis affecting skin (10/10/2013), and Tobacco abuse (04/19/2019). Surgical: Ms. Mikus  has a past surgical history that includes spinal cord stimulator; Occipital  nerve stimulator insertion; Cholecystectomy (2000); Knee surgery; Shoulder surgery; Tubal ligation (2000); Endometrial ablation; Spinal cord stimulator implant (2008); Knee surgery (Right, 02/2015); Breast surgery; Spinal cord stimulator insertion (N/A, 06/21/2016); Shoulder arthroscopy with distal clavicle resection (Right, 10/29/2018); Subacromial decompression (Right, 10/29/2018); laparoscopic appendectomy (Right, 01/01/2020); Appendectomy; Ankle surgery; Colonoscopy with esophagogastroduodenoscopy (egd); Colonoscopy (N/A, 02/20/2021); and Esophagogastroduodenoscopy (N/A, 02/20/2021). Family: family history includes Asthma in her mother; Depression in her mother; Fibromyalgia in an other family member; Stroke in an other family member; Thyroid disease in an other family member.  Laboratory Chemistry Profile   Renal Lab Results  Component Value Date   BUN 21 (H) 10/13/2021   CREATININE 1.59 (H) 10/13/2021   LABCREA 51 11/20/2020   BCR 16 07/23/2021   GFRAA 84 08/03/2020   GFRNONAA 41 (L) 10/13/2021    Hepatic Lab Results  Component Value Date   AST 16 10/13/2021   ALT 19 10/13/2021   ALBUMIN 3.4 (L) 10/13/2021   ALKPHOS 88 10/13/2021   LIPASE 24 10/13/2021    Electrolytes Lab Results  Component Value Date   NA 141 10/13/2021   K 3.5 10/13/2021   CL 108 10/13/2021   CALCIUM 9.1 10/13/2021   MG 2.0 04/20/2019   PHOS 3.2 04/20/2019    Bone Lab Results  Component Value Date   VD25OH 21 (L) 07/23/2021    Inflammation (CRP: Acute Phase) (ESR:  Chronic Phase) Lab Results  Component Value Date   CRP 1.0 (H) 04/05/2015   ESRSEDRATE 28 (H) 11/20/2020   LATICACIDVEN 1.8 10/13/2021         Note: Above Lab results reviewed.  Recent Imaging Review  MR LUMBAR SPINE WO CONTRAST CLINICAL DATA:  Low back pain, symptoms persist with > 6 wks treatment Lumbar radiculopathy, symptoms persist with > 6 wks treatment  EXAM: MRI LUMBAR SPINE WITHOUT CONTRAST  TECHNIQUE: Multiplanar,  multisequence MR imaging of the lumbar spine was performed. No intravenous contrast was administered.  COMPARISON:  MRI of the lumbar spine April 07, 2017.  FINDINGS: Segmentation: Standard segmentation is a soon. The inferior-most fully formed intervertebral disc is labeled L5-S1.  Alignment:  Normal.  Vertebrae: No fracture, evidence of discitis, or suspicious bone lesion.  Conus medullaris and cauda equina: Conus extends to the T12-L1 level. Conus and cauda equina appear normal.  Paraspinal and other soft tissues: Unremarkable.  Disc levels:  T12-L1: No significant disc protrusion, foraminal stenosis, or canal stenosis.  L1-L2: No significant disc protrusion, foraminal stenosis, or canal stenosis.  L2-L3: No significant disc protrusion, foraminal stenosis, or canal stenosis.  L3-L4: No significant disc protrusion, foraminal stenosis, or canal stenosis.  L4-L5: Mild facet arthropathy. Left far lateral/extraforaminal disc protrusion with mild resulting left foraminal stenosis. No significant canal or right foraminal stenosis.  L5-S1: Small left foraminal disc protrusion. Bilateral facet arthropathy. No significant stenosis.  IMPRESSION: Lower lumbar degenerative change without impingement.  Electronically Signed   By: Margaretha Sheffield M.D.   On: 11/04/2021 13:19 Note: Reviewed        Physical Exam  General appearance: Well nourished, well developed, and well hydrated. In no apparent acute distress Mental status: Alert, oriented x 3 (person, place, & time)       Respiratory: No evidence of acute respiratory distress Eyes: PERLA Vitals: BP 116/85 (BP Location: Left Arm, Patient Position: Sitting, Cuff Size: Large)   Pulse 95   Temp 98 F (36.7 C) (Temporal)   Ht _0  (1.626 m)   Wt 265 lb (120.2 kg)   SpO2 98%   BMI 45.49 kg/m  BMI: Estimated body mass index is 45.49 kg/m as calculated from the following:   Height as of this encounter: _1  (1.626  m).   Weight as of this encounter: 265 lb (120.2 kg). Ideal: Ideal body weight: 54.7 kg (120 lb 9.5 oz) Adjusted ideal body weight: 80.9 kg (178 lb 5.7 oz)  Assessment   Diagnosis Status  1. Chronic pain syndrome   2. Chronic neck pain (1ry area of Pain) (Bilateral) (L>R)   3. Chronic upper extremity pain (2ry area of Pain) (Bilateral) (L>R)   4. Chronic hip pain (3ry area of Pain) (Bilateral) (R>L)   5. Lumbar facet syndrome (Bilateral) (R>L)   6. Intractable low back pain   7. Fibromyalgia   8. Presence of functional implant (Medtr[employer] Lumbar spinal cord stimulator implant)   9. Pharmacologic therapy   10. Chronic use of opiate for therapeutic purpose   11. Encounter for medication management   12. Encounter for chronic pain management    Controlled Controlled Controlled   Updated Problems: No problems updated.  Plan of Care  Problem-specific:  No problem-specific Assessment & Plan notes found for this encounter.  Ms. LANIQUA TORRENS has a current medication list which includes the following long-term medication(s): amitriptyline, duloxetine, hyoscyamine, levocetirizine, lisinopril-hydrochlorothiazide, methocarbamol, [START ON 02/10/2022] oxycodone, [START ON 03/12/2022] oxycodone, [START ON 04/11/2022] oxycodone, pantoprazole,  rizatriptan, and topiramate.  Pharmacotherapy (Medications Ordered): Meds ordered this encounter  Medications   oxyCODONE (OXY IR/ROXICODONE) 5 MG immediate release tablet    Sig: Take 1 tablet (5 mg total) by mouth every 6 (six) hours as needed for severe pain. Must last 30 days    Dispense:  120 tablet    Refill:  0    DO NOT: delete (not duplicate); no partial-fill (will deny script to complete), no refill request (F/U required). DISPENSE: 1 day early if closed on fill date. WARN: No CNS-depressants within 8 hrs of med.   oxyCODONE (OXY IR/ROXICODONE) 5 MG immediate release tablet    Sig: Take 1 tablet (5 mg total) by mouth every 6 (six) hours as  needed for severe pain. Must last 30 days    Dispense:  120 tablet    Refill:  0    DO NOT: delete (not duplicate); no partial-fill (will deny script to complete), no refill request (F/U required). DISPENSE: 1 day early if closed on fill date. WARN: No CNS-depressants within 8 hrs of med.   oxyCODONE (OXY IR/ROXICODONE) 5 MG immediate release tablet    Sig: Take 1 tablet (5 mg total) by mouth every 6 (six) hours as needed for severe pain. Must last 30 days    Dispense:  120 tablet    Refill:  0    DO NOT: delete (not duplicate); no partial-fill (will deny script to complete), no refill request (F/U required). DISPENSE: 1 day early if closed on fill date. WARN: No CNS-depressants within 8 hrs of med.   naloxone (NARCAN) nasal spray 4 mg/0.1 mL    Sig: Place 1 spray into the nose as needed for up to 365 doses (for opioid-induced respiratory depresssion). In case of emergency (overdose), spray once into each nostril. If no response within 3 minutes, repeat application and call [phone].    Dispense:  1 each    Refill:  0    Instruct patient in proper use of device.   Orders:  No orders of the defined types were placed in this encounter.  Follow-up plan:   Return in about 4 months (around 05/11/2022) for Eval-day (M,W), (F2F), (MM).     Interventional Therapies  Risk  Complexity Considerations:   Estimated body mass index is 41.2 kg/m as calculated from the following:   Height as of this encounter: _0  (1.626 m).   Weight as of this encounter: 240 lb (108.9 kg). Latex allergy    Planned  Pending:   Diagnostic right hip x-rays and MRI (06/13/2021) Diagnostic/therapeutic right IA hip joint inj. #1    Under consideration:   Diagnostic/therapeutic right IA hip joint inj. #1  Diagnostic right suprascapular NB #1    Completed:   Palliative bilateral lumbar facet MBB x2 (11/16/2020) (90/90/80/85-90)  Therapeutic left lumbar facet MBB x2 (10/10/2016) (100/100/100/100)  Therapeutic right lumbar  facet MBB x1 (08/31/2015) (n/a)  Therapeutic right lumbar facet RFA x3 (05/08/2021) (100/100/50/50) on the right side Therapeutic left lumbar facet RFA x3 (03/22/2021) (100/100/99/99) on left side Palliative left C7-T1 CESI x1 (04/13/2015) (n/a)    Therapeutic  Palliative (PRN) options:   Palliative lumbar facet MBB  Palliative lumbar facet RFA  Palliative C7-T1 CESI  PRN SCS programming adjustments (2018 replacement by Dr. Clydell Hakim)   Pharmacotherapy:  Nonopioids transferred 04/06/2020: Robaxin Recommendations:   None at this time.     Recent Visits Date Type Provider Dept  11/07/21 Office Visit Milinda Pointer, MD [facility]  Showing recent visits within past 90 days and meeting all other requirements Today's Visits Date Type Provider Dept  01/23/22 Office Visit Milinda Pointer, MD [facility]  Showing today's visits and meeting all other requirements Future Appointments No visits were found meeting these conditions. Showing future appointments within next 90 days and meeting all other requirements  I discussed the assessment and treatment plan with the patient. The patient was provided an opportunity to ask questions and all were answered. The patient agreed with the plan and demonstrated an understanding of the instructions.  Patient advised to call back or seek an in-person evaluation if the symptoms or condition worsens.  Duration of encounter: 30 minutes.  Total time on encounter, as per AMA guidelines included both the face-to-face and non-face-to-face time personally spent by the physician and/or other qualified health care professional(s) on the day of the encounter (includes time in activities that require the physician or other qualified health care professional and does not include time in activities normally performed by clinical staff). Physician's time may include the following activities when performed: preparing to see the patient (eg,  review of tests, pre-charting review of records) obtaining and/or reviewing separately obtained history performing a medically appropriate examination and/or evaluation counseling and educating the patient/family/caregiver ordering medications, tests, or procedures referring and communicating with other health care professionals (when not separately reported) documenting clinical information in the electronic or other health record independently interpreting results (not separately reported) and communicating results to the patient/ family/caregiver care coordination (not separately reported)  Note by: Gaspar Cola, MD Date: 01/23/2022; Time: 2:29 PM

## 2022-01-23 NOTE — Patient Instructions (Signed)
____________________________________________________________________________________________  Patient Information update  To: All of our patients.  Re: Name change.  It has been made official that our current name, "[facility]"   will soon be changed to "[facility]".   The purpose of this change is to eliminate any confusion created by the concept of our practice being a "Medication Management Pain Clinic". In the past this has led to the misconception that we treat pain primarily by the use of prescription medications.  Nothing can be farther from the truth.   Understanding PAIN MANAGEMENT: To further understand what our practice does, you first have to understand that "Pain Management" is a subspecialty that requires additional training once a physician has completed their specialty training, which can be in either Anesthesia, Neurology, Psychiatry, or Physical Medicine and Rehabilitation (PMR). Each one of these contributes to the final approach taken by each physician to the management of their patient's pain. To be a "Pain Management Specialist" you must have first completed one of the specialty trainings below.  Anesthesiologists - trained in clinical pharmacology and interventional techniques such as nerve blockade and regional as well as central neuroanatomy. They are trained to block pain before, during, and after surgical interventions.  Neurologists - trained in the diagnosis and pharmacological treatment of complex neurological conditions, such as Multiple Sclerosis, Parkinson's, spinal cord injuries, and other systemic conditions that may be associated with symptoms that may include but are not limited to pain. They tend to rely primarily on the treatment of chronic pain using prescription medications.  Psychiatrist - trained in conditions affecting the psychosocial  wellbeing of patients including but not limited to depression, anxiety, schizophrenia, personality disorders, addiction, and other substance use disorders that may be associated with chronic pain. They tend to rely primarily on the treatment of chronic pain using prescription medications.   Physical Medicine and Rehabilitation (PMR) physicians, also known as physiatrists - trained to treat a wide variety of medical conditions affecting the brain, spinal cord, nerves, bones, joints, ligaments, muscles, and tendons. Their training is primarily aimed at treating patients that have suffered injuries that have caused severe physical impairment. Their training is primarily aimed at the physical therapy and rehabilitation of those patients. They may also work alongside orthopedic surgeons or neurosurgeons using their expertise in assisting surgical patients to recover after their surgeries.  INTERVENTIONAL PAIN MANAGEMENT is sub-subspecialty of Pain Management.  Our physicians are Board-certified in Anesthesia, Pain Management, and Interventional Pain Management.  This meaning that not only have they been trained and Board-certified in their specialty of Anesthesia, and subspecialty of Pain Management, but they have also received further training in the sub-subspecialty of Interventional Pain Management, in order to become Board-certified as INTERVENTIONAL PAIN MANAGEMENT SPECIALIST.    Mission: Our goal is to use our skills in  [place] as alternatives to the chronic use of prescription opioid medications for the treatment of pain. To make this more clear, we have changed our name to reflect what we do and offer. We will continue to offer medication management assessment and recommendations, but we will not be taking over any patient's medication management.  ____________________________________________________________________________________________      ____________________________________________________________________________________________  National Pain Medication Shortage  The [place] is experiencing worsening drug shortages. These have had a negative widespread effect on patient care and treatment. Not expected to improve any time soon. Predicted to last past 2029.   Drug shortage list (generic  names) Oxycodone IR Oxycodone/APAP Oxymorphone IR Hydromorphone Hydrocodone/APAP Morphine  Where is the problem?  Manufacturing and supply level.  Will this shortage affect you?  Only if you take any of the above pain medications.  How? You may be unable to fill your prescription.  Your pharmacist may offer a "partial fill" of your prescription. (Warning: Do not accept partial fills.) Prescriptions partially filled cannot be transferred to another pharmacy. Read our Medication Rules and Regulation. Depending on how much medicine you are dependent on, you may experience withdrawals when unable to get the medication.  Recommendations: Consider ending your dependence on opioid pain medications. Ask your pain specialist to assist you with the process. Consider switching to a medication currently not in shortage, such as Buprenorphine. Talk to your pain specialist about this option. Consider decreasing your pain medication requirements by managing tolerance thru "Drug Holidays". This may help minimize withdrawals, should you run out of medicine. Control your pain thru the use of non-pharmacological interventional therapies.   Your prescriber: Prescribers cannot be blamed for shortages. Medication manufacturing and supply issues cannot be fixed by the prescriber.   NOTE: The prescriber is not responsible for supplying the medication, or solving supply issues. Work with your pharmacist to solve it. The patient is responsible for the decision to take or continue taking the medication and for identifying and securing a legal supply source. By  law, supplying the medication is the job and responsibility of the pharmacy. The prescriber is responsible for the evaluation, monitoring, and prescribing of these medications.   Prescribers will NOT: Re-issue prescriptions that have been partially filled. Re-issue prescriptions already sent to a pharmacy.  Re-send prescriptions to a different pharmacy because yours did not have your medication. Ask pharmacist to order more medicine or transfer the prescription to another pharmacy. (Read below.)  New 2023 regulation: "October 12, 2021 Revised Regulation Allows DEA-Registered Pharmacies to Transfer Electronic Prescriptions at a Patient's Request [place] Patients now have the ability to request their electronic prescription be transferred to another pharmacy without having to go back to their practitioner to initiate the request. This revised regulation went into effect on Monday, October 08, 2021.     At a patient's request, a DEA-registered retail pharmacy can now transfer an electronic prescription for a controlled substance (schedules II-V) to another DEA-registered retail pharmacy. Prior to this change, patients would have to go through their practitioner to cancel their prescription and have it re-issued to a different pharmacy. The process was taxing and time consuming for both patients and practitioners.    The Drug Enforcement Administration [facility]) published its intent to revise the process for transferring electronic prescriptions on December 31, 2019.  The final rule was published in the federal register on September 06, 2021 and went into effect 30 days later.  Under the final rule, a prescription can only be transferred once between pharmacies, and only if allowed under existing state or other applicable law. The prescription must remain in its electronic form; may not be altered in any way; and the transfer must be communicated directly between  two licensed pharmacists. It's important to note, any authorized refills transfer with the original prescription, which means the entire prescription will be filled at the same pharmacy".  Reference: [employer] [facility] website announcement)  [email or web address] ([place])   [employer] / Vol. 88, No. 143 / Thursday, September 06, 2021 / Rules and Regulations DEPARTMENT OF JUSTICE  Drug Enforcement  Administration  21 CFR Part 1306  [Docket No. [id number]]  RIN 1117-AB64 Transfer of Electronic Prescriptions for Schedules II-V Controlled Substances Between Pharmacies for Initial Filling  ____________________________________________________________________________________________     ____________________________________________________________________________________________  Drug Holidays  What is a "Drug Holiday"? Drug Holiday: is the name given to the process of slowly tapering down and temporarily stopping the pain medication for the purpose of decreasing or eliminating tolerance to the drug.  Benefits Improved effectiveness Decreased required effective dose Improved pain control End dependence on high dose therapy Decrease cost of therapy Uncovering "opioid-induced hyperalgesia". (OIH)  What is "opioid hyperalgesia"? It is a paradoxical increase in pain caused by exposure to opioids. Stopping the opioid pain medication, contrary to the expected, it actually decreases or completely eliminates the pain. Ref.: "A comprehensive review of opioid-induced hyperalgesia". Brion Aliment, et.al. Pain Physician. 2011 Mar-Apr;14(2):145-61.  What is tolerance? Tolerance: the progressive loss of effectiveness of a pain medicine due to repetitive use. A common problem of opioid pain medications.  How long should a "Drug  Holiday" last? Effectiveness depends on the patient staying off all opioid pain medicines for a minimum of 14 consecutive days. (2 weeks)  How about just taking less of the medicine? Does not work. Will not accomplish goal of eliminating the excess receptors.  How about switching to a different pain medicine? (AKA. "Opioid rotation") Does not work. Creates the illusion of effectiveness by taking advantage of inaccurate equivalent dose calculations between different opioids. -This "technique" was promoted by studies funded by [employer], such as [employer], creators of "OxyContin".  Can I stop the medicine "cold [place]"? Depends. You should always coordinate with your Pain Specialist to make the transition as smoothly as possible. Avoid stopping the medicine abruptly without consulting. We recommend a "slow taper".  What is a slow taper? Taper: refers to the gradual decrease in dose.   How do I stop/taper the dose? Slowly. Decrease the daily amount of pills that you take by one (1) pill every seven (7) days. This is called a "slow downward taper". Example: if you normally take four (4) pills per day, drop it to three (3) pills per day for seven (7) days, then to two (2) pills per day for seven (7) days, then to one (1) per day for seven (7) days, and then stop the medicine. The 14 day "Drug Holiday" starts on the first day without medicine.   Will I experience withdrawals? Unlikely with a slow taper.  What triggers withdrawals? Withdrawals are triggered by the sudden/abrupt stop of high dose opioids. Withdrawals can be avoided by slowly decreasing the dose over a prolonged period of time.  What are withdrawals? Symptoms associated with sudden/abrupt reduction/stopping of high-dose, long-term use of pain medication. Withdrawal are seldom seen on low dose therapy, or patients rarely taking opioid medication.  Early Withdrawal Symptoms may include: Agitation Anxiety Muscle  aches Increased tearing Insomnia Runny nose Sweating Yawning  Late symptoms may include: Abdominal cramping Diarrhea Dilated pupils Goose bumps Nausea Vomiting  (Last update: 01/20/2022) ____________________________________________________________________________________________    _______________________________________________________________________  Medication Rules  Purpose: To inform patients, and their family members, of our medication rules and regulations.  Applies to: All patients receiving prescriptions from our practice (written or electronic).  Pharmacy of record: This is the pharmacy where your electronic prescriptions will be sent. Make sure we have the correct one.  Electronic prescriptions: In compliance with the [place] (STOP) Act of 2017 (Session Lanny Cramp [id number]), effective February 11, 2018, all controlled substances must be electronically prescribed. Written prescriptions, faxing, or calling prescriptions to a pharmacy will no longer be done.  Prescription refills: These will be provided only during in-person appointments. No medications will be renewed without a "face-to-face" evaluation with your provider. Applies to all prescriptions.  NOTE: The following applies primarily to controlled substances (Opioid* Pain Medications).   Type of encounter (visit): For patients receiving controlled substances, face-to-face visits are required. (Not an option and not up to the patient.)  Patient's responsibilities: Pain Pills: Bring all pain pills to every appointment (except for procedure appointments). Pill Bottles: Bring pills in original pharmacy bottle. Bring bottle, even if empty. Always bring the bottle of the most recent fill.  Medication refills: You are responsible for knowing and keeping track of what medications you are taking and when is it that you will need a refill. The day before your appointment: write a  list of all prescriptions that need to be refilled. The day of the appointment: give the list to the admitting nurse. Prescriptions will be written only during appointments. No prescriptions will be written on procedure days. If you forget a medication: it will not be "Called in", "Faxed", or "electronically sent". You will need to get another appointment to get these prescribed. No early refills. Do not call asking to have your prescription filled early. Partial  or short prescriptions: Occasionally your pharmacy may not have enough pills to fill your prescription.  NEVER ACCEPT a partial fill or a prescription that is short of the total amount of pills that you were prescribed.  With controlled substances the law allows 72 hours for the pharmacy to complete the prescription.  If the prescription is not completed within 72 hours, the pharmacist will require a new prescription to be written. This means that you will be short on your medicine and we WILL NOT send another prescription to complete your original prescription.  Instead, request the pharmacy to send a carrier to a nearby branch to get enough medication to provide you with your full prescription. Prescription Accuracy: You are responsible for carefully inspecting your prescriptions before leaving our office. Have the discharge nurse carefully go over each prescription with you, before taking them home. Make sure that your name is accurately spelled, that your address is correct. Check the name and dose of your medication to make sure it is accurate. Check the number of pills, and the written instructions to make sure they are clear and accurate. Make sure that you are given enough medication to last until your next medication refill appointment. Taking Medication: Take medication as prescribed. When it comes to controlled substances, taking less pills or less frequently than prescribed is permitted and encouraged. Never take more pills than  instructed. Never take the medication more frequently than prescribed.  Inform other Doctors: Always inform, all of your healthcare providers, of all the medications you take. Pain Medication from other Providers: You are not allowed to accept any additional pain medication from any other Doctor or Healthcare provider. There are two exceptions to this rule. (see below) In the event that you require additional pain medication, you are responsible for notifying us, as stated below. Cough Medicine: Often these contain an opioid, such as codeine or hydrocodone. Never accept or take cough medicine containing these opioids if you are already taking an opioid* medication. The combination may cause respiratory failure and death. Medication Agreement: You are responsible for carefully reading and following our Medication Agreement. This  must be signed before receiving any prescriptions from our practice. Safely store a copy of your signed Agreement. Violations to the Agreement will result in no further prescriptions. (Additional copies of our Medication Agreement are available upon request.) Laws, Rules, & Regulations: All patients are expected to follow all Federal and [employer], Rules, [employer]. Ignorance of the Laws does not constitute a valid excuse.  Illegal drugs and Controlled Substances: The use of illegal substances (including, but not limited to marijuana and its derivatives) and/or the illegal use of any controlled substances is strictly prohibited. Violation of this rule may result in the immediate and permanent discontinuation of any and all prescriptions being written by our practice. The use of any illegal substances is prohibited. Adopted CDC guidelines & recommendations: Target dosing levels will be at or below 60 MME/day. Use of benzodiazepines** is not recommended.  Exceptions: There are only two exceptions to the rule of not receiving pain medications from other Healthcare  Providers. Exception #1 (Emergencies): In the event of an emergency (i.e.: accident requiring emergency care), you are allowed to receive additional pain medication. However, you are responsible for: As soon as you are able, call our office [phone], at any time of the day or night, and leave a message stating your name, the date and nature of the emergency, and the name and dose of the medication prescribed. In the event that your call is answered by a member of our staff, make sure to document and save the date, time, and the name of the person that took your information.  Exception #2 (Planned Surgery): In the event that you are scheduled by another doctor or dentist to have any type of surgery or procedure, you are allowed (for a period no longer than 30 days), to receive additional pain medication, for the acute post-op pain. However, in this case, you are responsible for picking up a copy of our "Post-op Pain Management for Surgeons" handout, and giving it to your surgeon or dentist. This document is available at our office, and does not require an appointment to obtain it. Simply go to our office during business hours (Monday-Thursday from 8:00 AM to 4:00 PM) (Friday 8:00 AM to 12:00 Noon) or if you have a scheduled appointment with [place], prior to your surgery, and ask for it by name. In addition, you are responsible for: calling our office [phone], at any time of the day or night, and leaving a message stating your name, name of your surgeon, type of surgery, and date of procedure or surgery. Failure to comply with your responsibilities may result in termination of therapy involving the controlled substances. Medication Agreement Violation. Following the above rules, including your responsibilities will help you in avoiding a Medication Agreement Violation ("Breaking your Pain Medication Contract").  Consequences:  Not following the above rules may result in permanent discontinuation of  medication prescription therapy.  *Opioid medications include: morphine, codeine, oxycodone, oxymorphone, hydrocodone, hydromorphone, meperidine, tramadol, tapentadol, buprenorphine, fentanyl, methadone. **Benzodiazepine medications include: diazepam (Valium), alprazolam (Xanax), clonazepam (Klonopine), lorazepam (Ativan), clorazepate (Tranxene), chlordiazepoxide (Librium), estazolam (Prosom), oxazepam (Serax), temazepam (Restoril), triazolam (Halcion) (Last updated: 12/04/2021) ______________________________________________________________________    ______________________________________________________________________  Medication Recommendations and Reminders  Applies to: All patients receiving prescriptions (written and/or electronic).  Medication Rules & Regulations: You are responsible for reading, knowing, and following our "Medication Rules" document. These exist for your safety and that of others. They are not flexible and neither are we. Dismissing or ignoring them is an  act of "non-compliance" that may result in complete and irreversible termination of such medication therapy. For safety reasons, "non-compliance" will not be tolerated. As with the [place] fundamental legal principle of "ignorance of the law is no defense", we will accept no excuses for not having read and knowing the content of documents provided to you by our practice.  Pharmacy of record:  Definition: This is the pharmacy where your electronic prescriptions will be sent.  We do not endorse any particular pharmacy. It is up to you and your insurance to decide what pharmacy to use.  We do not restrict you in your choice of pharmacy. However, once we write for your prescriptions, we will NOT be re-sending more prescriptions to fix restricted supply problems created by your pharmacy, or your insurance.  The pharmacy listed in the electronic medical record should be the one where you want electronic prescriptions to be  sent. If you choose to change pharmacy, simply notify our nursing staff. Changes will be made only during your regular appointments and not over the phone.  Recommendations: Keep all of your pain medications in a safe place, under lock and key, even if you live alone. We will NOT replace lost, stolen, or damaged medication. We do not accept "Police Reports" as proof of medications having been stolen. After you fill your prescription, take 1 week's worth of pills and put them away in a safe place. You should keep a separate, properly labeled bottle for this purpose. The remainder should be kept in the original bottle. Use this as your primary supply, until it runs out. Once it's gone, then you know that you have 1 week's worth of medicine, and it is time to come in for a prescription refill. If you do this correctly, it is unlikely that you will ever run out of medicine. To make sure that the above recommendation works, it is very important that you make sure your medication refill appointments are scheduled at least 1 week before you run out of medicine. To do this in an effective manner, make sure that you do not leave the office without scheduling your next medication management appointment. Always ask the nursing staff to show you in your prescription , when your medication will be running out. Then arrange for the receptionist to get you a return appointment, at least 7 days before you run out of medicine. Do not wait until you have 1 or 2 pills left, to come in. This is very poor planning and does not take into consideration that we may need to cancel appointments due to bad weather, sickness, or emergencies affecting our staff. DO NOT ACCEPT A "Partial Fill": If for any reason your pharmacy does not have enough pills/tablets to completely fill or refill your prescription, do not allow for a "partial fill". The law allows the pharmacy to complete that prescription within 72 hours, without requiring a new  prescription. If they do not fill the rest of your prescription within those 72 hours, you will need a separate prescription to fill the remaining amount, which we will NOT provide. If the reason for the partial fill is your insurance, you will need to talk to the pharmacist about payment alternatives for the remaining tablets, but again, DO NOT ACCEPT A PARTIAL FILL, unless you can trust your pharmacist to obtain the remainder of the pills within 72 hours.  Prescription refills and/or changes in medication(s):  Prescription refills, and/or changes in dose or medication, will be conducted only  during scheduled medication management appointments. (Applies to both, written and electronic prescriptions.) No refills on procedure days. No medication will be changed or started on procedure days. No changes, adjustments, and/or refills will be conducted on a procedure day. Doing so will interfere with the diagnostic portion of the procedure. No phone refills. No medications will be "called into the pharmacy". No Fax refills. No weekend refills. No Holliday refills. No after hours refills.  Remember:  Business hours are:  Monday to Thursday 8:00 AM to 4:00 PM Provider's Schedule: Milinda Pointer, MD - Appointments are:  Medication management: Monday and Wednesday 8:00 AM to 4:00 PM Procedure day: Tuesday and Thursday 7:30 AM to 4:00 PM Gillis Santa, MD - Appointments are:  Medication management: Tuesday and Thursday 8:00 AM to 4:00 PM Procedure day: Monday and Wednesday 7:30 AM to 4:00 PM (Last update: 12/04/2021) ______________________________________________________________________    ____________________________________________________________________________________________  WARNING: CBD (cannabidiol) & Delta (Delta-8 tetrahydrocannabinol) products.   Applicable to:  All individuals currently taking or considering taking CBD (cannabidiol) and, more important, all patients taking opioid  analgesic controlled substances (pain medication). (Example: oxycodone; oxymorphone; hydrocodone; hydromorphone; morphine; methadone; tramadol; tapentadol; fentanyl; buprenorphine; butorphanol; dextromethorphan; meperidine; codeine; etc.)  Introduction:  Recently there has been a drive towards the use of "natural" products for the treatment of different conditions, including pain anxiety and sleep disorders. Marijuana and hemp are two varieties of the cannabis genus plants. Marijuana and its derivatives are illegal, while hemp and its derivatives are not. Cannabidiol (CBD) and tetrahydrocannabinol (THC), are two natural compounds found in plants of the Cannabis genus. They can both be extracted from hemp or marijuana. Both compounds interact with your body's endocannabinoid system in very different ways. CBD is associated with pain relief (analgesia) while THC is associated with the psychoactive effects ("the high") obtained from the use of marijuana products. There are two main types of THC: Delta-9, which comes from the marijuana plant and it is illegal, and Delta-8, which comes from the hemp plant, and it is legal. (Both, Delta-9-THC and Delta-8-THC are psychoactive and give you "the high".)   Legality:  Marijuana and its derivatives: illegal Hemp and its derivatives: Legal (State dependent) UPDATE: (03/30/2021) The Drug Enforcement Agency ([place]) issued a letter stating that "delta" cannabinoids, including Delta-8-THCO and Delta-9-THCO, synthetically derived from hemp do not qualify as hemp and will be viewed as Schedule I drugs. (Schedule I drugs, substances, or chemicals are defined as drugs with no currently accepted medical use and a high potential for abuse. Some examples of Schedule I drugs are: heroin, lysergic acid diethylamide (LSD), marijuana (cannabis), 3,4-methylenedioxymethamphetamine (ecstasy), methaqualone, and peyote.) (https://jennings.com/)  Legal status of CBD in [place]:  "Conditionally  Legal"  Reference: "FDA Regulation of Cannabis and Cannabis-Derived Products, Including Cannabidiol (CBD)" - SeekArtists.com.pt  Warning:  CBD is not FDA approved and has not undergo the same manufacturing controls as prescription drugs.  This means that the purity and safety of available CBD may be questionable. Most of the time, despite manufacturer's claims, it is contaminated with THC (delta-9-tetrahydrocannabinol - the chemical in marijuana responsible for the "HIGH").  When this is the case, the [facility] contaminant will trigger a positive urine drug screen (UDS) test for Marijuana (carboxy-THC).   The FDA recently put out a warning about 5 things that everyone should be aware of regarding Delta-8 THC: Delta-8 THC products have not been evaluated or approved by the FDA for safe use and may be marketed in ways that put the public health at  risk. The FDA has received adverse event reports involving delta-8 THC-containing products. Delta-8 THC has psychoactive and intoxicating effects. Delta-8 THC manufacturing often involve use of potentially harmful chemicals to create the concentrations of delta-8 THC claimed in the marketplace. The final delta-8 THC product may have potentially harmful by-products (contaminants) due to the chemicals used in the process. Manufacturing of delta-8 THC products may occur in uncontrolled or unsanitary settings, which may lead to the presence of unsafe contaminants or other potentially harmful substances. Delta-8 THC products should be kept out of the reach of children and pets.  NOTE: Because a positive UDS for any illicit substance is a violation of our medication agreement, your opioid analgesics (pain medicine) may be permanently discontinued.  MORE ABOUT CBD  General Information: CBD was discovered in [birth year] and it is a derivative of the cannabis sativa  genus plants (Marijuana and Hemp). It is one of the 113 identified substances found in Marijuana. It accounts for up to 40% of the plant's extract. As of 2018, preliminary clinical studies on CBD included research for the treatment of anxiety, movement disorders, and pain. CBD is available and consumed in multiple forms, including inhalation of smoke or vapor, as an aerosol spray, and by mouth. It may be supplied as an oil containing CBD, capsules, dried cannabis, or as a liquid solution. CBD is thought not to be as psychoactive as THC (delta-9-tetrahydrocannabinol - the chemical in marijuana responsible for the "HIGH"). Studies suggest that CBD may interact with different biological target receptors in the body, including cannabinoid and other neurotransmitter receptors. As of 2018 the mechanism of action for its biological effects has not been determined.  Side-effects  Adverse reactions: Dry mouth, diarrhea, decreased appetite, fatigue, drowsiness, malaise, weakness, sleep disturbances, and others.  Drug interactions:  CBD may interact with medications such as blood-thinners. CBD causes drowsiness on its own and it will increase drowsiness caused by other medications, including antihistamines (such as Benadryl), benzodiazepines (Xanax, Ativan, Valium), antipsychotics, antidepressants, opioids, alcohol and supplements such as kava, melatonin and St. John's Wort.  Other drug interactions: Brivaracetam (Briviact); Caffeine; Carbamazepine (Tegretol); Citalopram (Celexa); Clobazam (Onfi); Eslicarbazepine (Aptiom); Everolimus (Zostress); Lithium; Methadone (Dolophine); Rufinamide (Banzel); Sedative medications (CNS depressants); Sirolimus (Rapamune); Stiripentol (Diacomit); Tacrolimus (Prograf); Tamoxifen ; Soltamox); Topiramate (Topamax); Valproate; Warfarin (Coumadin); Zonisamide. (Last update: 01/21/2022) ____________________________________________________________________________________________    ____________________________________________________________________________________________  Naloxone Nasal Spray  Why am I receiving this medication? [place] STOP ACT requires that all patients taking high dose opioids or at risk of opioids respiratory depression, be prescribed an opioid reversal agent, such as Naloxone (AKA: Narcan).  What is this medication? NALOXONE (nal OX one) treats opioid overdose, which causes slow or shallow breathing, severe drowsiness, or trouble staying awake. Call emergency services after using this medication. You may need additional treatment. Naloxone works by reversing the effects of opioids. It belongs to a group of medications called opioid blockers.  COMMON BRAND NAME(S): Kloxxado, Narcan  What should I tell my care team before I take this medication? They need to know if you have any of these conditions: Heart disease Substance use disorder An unusual or allergic reaction to naloxone, other medications, foods, dyes, or preservatives Pregnant or trying to get pregnant Breast-feeding  When to use this medication? This medication is to be used for the treatment of respiratory depression (less than 8 breaths per minute) secondary to opioid overdose.   How to use this medication? This medication is for use in the nose. Lay the person on their  back. Support their neck with your hand and allow the head to tilt back before giving the medication. The nasal spray should be given into 1 nostril. After giving the medication, move the person onto their side. Do not remove or test the nasal spray until ready to use. Get emergency medical help right away after giving the first dose of this medication, even if the person wakes up. You should be familiar with how to recognize the signs and symptoms of a narcotic overdose. If more doses are needed, give the additional dose in the other nostril. Talk to your care team about the use of this medication in children.  While this medication may be prescribed for children as young as newborns for selected conditions, precautions do apply.  Naloxone Overdosage: If you think you have taken too much of this medicine contact a poison control center or emergency room at once.  NOTE: This medicine is only for you. Do not share this medicine with others.  What if I miss a dose? This does not apply.  What may interact with this medication? This is only used during an emergency. No interactions are expected during emergency use. This list may not describe all possible interactions. Give your health care provider a list of all the medicines, herbs, non-prescription drugs, or dietary supplements you use. Also tell them if you smoke, drink alcohol, or use illegal drugs. Some items may interact with your medicine.  What should I watch for while using this medication? Keep this medication ready for use in the case of an opioid overdose. Make sure that you have the phone number of your care team and local hospital ready. You may need to have additional doses of this medication. Each nasal spray contains a single dose. Some emergencies may require additional doses. After use, bring the treated person to the nearest hospital or call 911. Make sure the treating care team knows that the person has received a dose of this medication. You will receive additional instructions on what to do during and after use of this medication before an emergency occurs.  What side effects may I notice from receiving this medication? Side effects that you should report to your care team as soon as possible: Allergic reactions--skin rash, itching, hives, swelling of the face, lips, tongue, or throat Side effects that usually do not require medical attention (report these to your care team if they continue or are bothersome): Constipation Dryness or irritation inside the nose Headache Increase in blood pressure Muscle spasms Stuffy  nose Toothache This list may not describe all possible side effects. Call your doctor for medical advice about side effects. You may report side effects to FDA at 1-800-FDA-1088.  Where should I keep my medication? Because this is an emergency medication, you should keep it with you at all times.  Keep out of the reach of children and pets. Store between 20 and 25 degrees C (68 and 77 degrees F). Do not freeze. Throw away any unused medication after the expiration date. Keep in original box until ready to use.  NOTE: This sheet is a summary. It may not cover all possible information. If you have questions about this medicine, talk to your doctor, pharmacist, or health care provider.   2023 Elsevier/Gold Standard (2020-10-06 00:00:00)  ____________________________________________________________________________________________

## 2022-01-31 ENCOUNTER — Ambulatory Visit: Payer: 59 | Admitting: Neurology

## 2022-02-05 ENCOUNTER — Encounter: Payer: Self-pay | Admitting: Neurology

## 2022-02-05 ENCOUNTER — Ambulatory Visit: Payer: 59 | Admitting: Neurology

## 2022-02-05 VITALS — BP 122/81 | HR 105 | Ht 64.6 in | Wt 265.5 lb

## 2022-02-05 DIAGNOSIS — R202 Paresthesia of skin: Secondary | ICD-10-CM

## 2022-02-05 HISTORY — DX: Paresthesia of skin: R20.2

## 2022-02-05 MED ORDER — NORTRIPTYLINE HCL 10 MG PO CAPS: 20.0000 mg | ORAL_CAPSULE | Freq: Every day | ORAL | 11 refills | Status: DC

## 2022-02-05 MED ORDER — NURTEC 75 MG PO TBDP: ORAL_TABLET | ORAL | 11 refills | Status: DC

## 2022-02-05 NOTE — Patient Instructions (Signed)
Meds ordered this encounter  Medications   nortriptyline (PAMELOR) 10 MG capsule    Sig: Take 2 capsules (20 mg total) by mouth at bedtime.    Dispense:  60 capsule    Refill:  11   Rimegepant Sulfate (NURTEC) 75 MG TBDP    Sig: Take 1 tab at onset of migraine.  May repeat in 2 hrs, if needed.  Max dose: 2 tabs/day. This is a 30 day prescription.    Dispense:  12 tablet    Refill:  11

## 2022-02-05 NOTE — Progress Notes (Signed)
Chief Complaint  Patient presents with   New Patient (Initial Visit)    Rm 15. Alone. NP internal referral for Bilateral hand/foot numbness, dizziness, migraine.      ASSESSMENT AND PLAN  DAVIS VANNATTER is a 46 y.o. female    Intermittent bilateral hands and feet paresthesia  Differentiation diagnosis includes upper extremity focal neuropathy such as carpal tunnel syndrome, or peripheral neuropathy  EMG nerve conduction study Chronic migraine headaches  Not a good candidate for Topamax as migraine prevention due to history of kidney stone, stop Topamax,  Starting nortriptyline titrating to 20 mg every night  Suboptimal control with Maxalt, will try Nurtec as needed  DIAGNOSTIC DATA (LABS, IMAGING, TESTING) - I reviewed patient records, labs, notes, testing and imaging myself where available.   MEDICAL HISTORY:  ALLIANNA BEAUBIEN is a 46 year old female, seen in request by her primary care [facility] nurse practitioner Samuel Bouche, for evaluation of bilateral hands, feet paresthesia, initial evaluation was on February 05, 2022  I reviewed and summarized the referring note. PMHx Depression, anxiety HTN Kidney stone. Chronic low back pain, under pain management, s/p spinal cord stimulator placement in May 2018, oxycodone 33m x4 tabs a day prn. Chronic migraine Quit smoke since Nov 2023.  She had long history of headaches, getting worse over the past few years, despite taking Topamax 50 mg every night, she has migraine once or twice each week, lateralized severe pounding headache with light noise sensitivity, Maxalt 10 mg as needed was helpful, but she has to take second dose 50% of the time, sleep always helps  She complains of few months history of numbness tingling, involving all 5 fingers on both hands, to the point she dropped things from her hands, sometimes woke her up from sleep, have to shake her hands to make the sensation come back  Over the past few  months, she also experiences constant numbness tingling at the bottom of her feet  She has mild gait abnormality due to previous history of right ankle fracture, required surgery, continue to have discomfort  She denies bowel bladder incontinence   PHYSICAL EXAM:   Vitals:   02/05/22 1555  BP: 122/81  Pulse: (!) 105  Weight: 265 lb 8 oz (120.4 kg)  Height: 5' 4.6" (1.641 m)   Not recorded     Body mass index is 44.73 kg/m.  PHYSICAL EXAMNIATION:  Gen: NAD, conversant, well nourised, well groomed                     Cardiovascular: Regular rate rhythm, no peripheral edema, warm, nontender. Eyes: Conjunctivae clear without exudates or hemorrhage Neck: Supple, no carotid bruits. Pulmonary: Clear to auscultation bilaterally   NEUROLOGICAL EXAM:  MENTAL STATUS: Speech/cognition: Awake, alert, oriented to history taking and casual conversation, obese CRANIAL NERVES: CN II: Visual fields are full to confrontation. Pupils are round equal and briskly reactive to light. CN III, IV, VI: extraocular movement are normal. No ptosis. CN V: Facial sensation is intact to light touch CN VII: Face is symmetric with normal eye closure  CN VIII: Hearing is normal to causal conversation. CN IX, X: Phonation is normal. CN XI: Head turning and shoulder shrug are intact  MOTOR: There is no pronator drift of out-stretched arms. Muscle bulk and tone are normal. Muscle strength is normal.  REFLEXES: Reflexes are 1 and symmetric at the biceps, triceps, knees, and ankles. Plantar responses are flexor.  SENSORY: Intact to light touch, pinprick  and vibratory sensation are intact in fingers and toes.  With exception of decreased pinprick at first 3 finger pads  COORDINATION: There is no trunk or limb dysmetria noted.  GAIT/STANCE: Need push-up to get up from seated position, steady, mildly antalgic  REVIEW OF SYSTEMS:  Full 14 system review of systems performed and notable only for as  above All other review of systems were negative.   ALLERGIES: Allergies  Allergen Reactions   Celecoxib Swelling    "Body swelling"   Doxycycline Other (See Comments) and Nausea And Vomiting    "SICKNESS"   Gabapentin Swelling    Whole body swelling   Latex Itching and Swelling   Nabumetone Itching   Pentazocine Lactate Itching and Other (See Comments)    HALLUCINATIONS   Seroquel [Quetiapine] Other (See Comments)    Falls   Amoxicillin Diarrhea and Nausea And Vomiting   Bactrim [Sulfamethoxazole-Trimethoprim] Other (See Comments)    "Makes very sick"   Cephalexin Other (See Comments) and Hives    Nausea and causes uti's   Erythromycin Other (See Comments)    RESULTANT UTI   Hydromorphone Hcl Other (See Comments)    HALLUCINATIONS   Lyrica [Pregabalin] Other (See Comments)    "Too sleepy"    HOME MEDICATIONS: Current Outpatient Medications  Medication Sig Dispense Refill   amitriptyline (ELAVIL) 100 MG tablet TAKE 1 TABLET BY MOUTH EVERYDAY AT BEDTIME (Patient taking differently: Take 100 mg by mouth at bedtime.) 90 tablet 3   Biotin 5000 MCG CAPS Take 5,000 mcg by mouth daily.     DULoxetine (CYMBALTA) 60 MG capsule Take 1 capsule (60 mg total) by mouth 2 (two) times daily. 180 capsule 3   hyoscyamine (LEVSIN SL) 0.125 MG SL tablet Place 1 tablet (0.125 mg total) under the tongue daily as needed. 90 tablet 3   levocetirizine (XYZAL) 5 MG tablet Take 1 tablet (5 mg total) by mouth every evening. 90 tablet 3   lisinopril-hydrochlorothiazide (ZESTORETIC) 10-12.5 MG tablet Take 1 tablet by mouth daily. 90 tablet 3   meclizine (ANTIVERT) 25 MG tablet Take 1 tablet (25 mg total) by mouth 3 (three) times daily as needed for dizziness or nausea. 90 tablet 1   methocarbamol (ROBAXIN) 750 MG tablet Take 1 tablet (750 mg total) by mouth every 8 (eight) hours as needed for muscle spasms. 270 tablet 3   naloxone (NARCAN) nasal spray 4 mg/0.1 mL Place 1 spray into the nose as needed for  up to 365 doses (for opioid-induced respiratory depresssion). In case of emergency (overdose), spray once into each nostril. If no response within 3 minutes, repeat application and call 789. 1 each 0   [START ON 02/10/2022] oxyCODONE (OXY IR/ROXICODONE) 5 MG immediate release tablet Take 1 tablet (5 mg total) by mouth every 6 (six) hours as needed for severe pain. Must last 30 days 120 tablet 0   [START ON 03/12/2022] oxyCODONE (OXY IR/ROXICODONE) 5 MG immediate release tablet Take 1 tablet (5 mg total) by mouth every 6 (six) hours as needed for severe pain. Must last 30 days 120 tablet 0   [START ON 04/11/2022] oxyCODONE (OXY IR/ROXICODONE) 5 MG immediate release tablet Take 1 tablet (5 mg total) by mouth every 6 (six) hours as needed for severe pain. Must last 30 days 120 tablet 0   pantoprazole (PROTONIX) 40 MG tablet TAKE 1 TABLET(40 MG) BY MOUTH DAILY 90 tablet 3   Probiotic Product (ALIGN PO) Take 1 tablet by mouth daily.  rizatriptan (MAXALT) 10 MG tablet Take 1 tablet (10 mg total) by mouth as needed for migraine. May repeat in 2 hours if needed (Patient taking differently: Take 10 mg by mouth daily as needed for migraine. May repeat in 2 hours if needed) 10 tablet 12   topiramate (TOPAMAX) 50 MG tablet Take 1 tablet (50 mg total) by mouth 2 (two) times daily. (Patient taking differently: Take 50 mg by mouth daily.) 180 tablet 1   No current facility-administered medications for this visit.    PAST MEDICAL HISTORY: Past Medical History:  Diagnosis Date   Abscess of axillary fold 10/10/2013   Acute bronchitis with asthma with acute exacerbation 01/23/2015   Dehydration 04/19/2019   Elevated lactic acid level 04/19/2019   Encounter for management of implanted device 12/19/2014   Encounter for therapeutic drug level monitoring 12/19/2014   Fibromyalgia    GERD (gastroesophageal reflux disease)    Hyperglycemia 04/19/2019   Hypertension    Implant pain at battery site (right buttocks  area) 04/05/2015   KIDNEY STONES 07/02/2007   Qualifier: History of  By: Bobby Rumpf CMA (AAMA), Patty     Long term current use of opiate analgesic 12/19/2014   Morbid obesity ([place]) 12/19/2014   Muscle cramping 12/19/2014   Opiate use (30 MME/Day) 12/19/2014   Osteoarthritis    Pain    chronic regional pain syndrome   Right knee pain 01/10/2015   Sepsis ([address]) 04/19/2019   Sepsis affecting skin 10/10/2013   Tobacco abuse 04/19/2019    PAST SURGICAL HISTORY: Past Surgical History:  Procedure Laterality Date   ANKLE SURGERY     APPENDECTOMY     BREAST SURGERY     CHOLECYSTECTOMY  2000   COLONOSCOPY N/A 02/20/2021   Procedure: COLONOSCOPY;  Surgeon: Lesly Rubenstein, MD;  Location: [facility] ENDOSCOPY;  Service: Endoscopy;  Laterality: N/A;   COLONOSCOPY WITH ESOPHAGOGASTRODUODENOSCOPY (EGD)     ENDOMETRIAL ABLATION     ESOPHAGOGASTRODUODENOSCOPY N/A 02/20/2021   Procedure: ESOPHAGOGASTRODUODENOSCOPY (EGD);  Surgeon: Lesly Rubenstein, MD;  Location: [facility] ENDOSCOPY;  Service: Endoscopy;  Laterality: N/A;   KNEE SURGERY     KNEE SURGERY Right 02/2015   LAPAROSCOPIC APPENDECTOMY Right 01/01/2020   Procedure: APPENDECTOMY LAPAROSCOPIC;  Surgeon: Rolm Bookbinder, MD;  Location: [facility] ORS;  Service: General;  Laterality: Right;   OCCIPITAL NERVE STIMULATOR INSERTION     SHOULDER ARTHROSCOPY WITH DISTAL CLAVICLE RESECTION Right 10/29/2018   Procedure: SHOULDER ARTHROSCOPY WITH DISTAL CLAVICLE RESECTION;  Surgeon: Hiram Gash, MD;  Location: [place];  Service: Orthopedics;  Laterality: Right;   SHOULDER SURGERY     spinal cord stimulator     SPINAL CORD STIMULATOR IMPLANT  2008   SPINAL CORD STIMULATOR INSERTION N/A 06/21/2016   Procedure: LUMBAR SPINAL CORD STIMULATOR INSERTION;  Surgeon: Clydell Hakim, MD;  Location: [place];  Service: Neurosurgery;  Laterality: N/A;  LUMBAR SPINAL CORD STIMULATOR INSERTION   SUBACROMIAL DECOMPRESSION Right 10/29/2018   Procedure:  SUBACROMIAL DECOMPRESSION;  Surgeon: Hiram Gash, MD;  Location: [place];  Service: Orthopedics;  Laterality: Right;   TUBAL LIGATION  2000    FAMILY HISTORY: Family History  Problem Relation Age of Onset   Depression Mother        maternal grandmother   Asthma Mother    Fibromyalgia Other        aunt   Stroke Other        grandmother   Thyroid disease Other  grandmother    SOCIAL HISTORY: Social History   Socioeconomic History   Marital status: Married    Spouse name: Not on file   Number of children: Not on file   Years of education: Not on file   Highest education level: Not on file  Occupational History   Not on file  Tobacco Use   Smoking status: Every Day    Types: Cigarettes    Last attempt to quit: 05/06/2020    Years since quitting: 1.7   Smokeless tobacco: Former    Quit date: 12/22/2017  Vaping Use   Vaping Use: Former  Substance and Sexual Activity   Alcohol use: No    Alcohol/week: 0.0 standard drinks of alcohol   Drug use: No   Sexual activity: Yes    Partners: Male    Birth control/protection: Surgical  Other Topics Concern   Not on file  Social History Narrative   Not on file   Social Determinants of Health   Financial Resource Strain: Not on file  Food Insecurity: Not on file  Transportation Needs: Not on file  Physical Activity: Not on file  Stress: Not on file  Social Connections: Not on file  Intimate Partner Violence: Not on file      Marcial Pacas, M.D. Ph.D.  [facility] [address] [place], [place] [address] Ph: [phone] Fax: [phone]  CC:  Samuel Bouche, [address] [place],  [place] [id number]  Samuel Bouche, NP

## 2022-02-27 ENCOUNTER — Other Ambulatory Visit: Payer: Self-pay | Admitting: Medical-Surgical

## 2022-02-27 DIAGNOSIS — R110 Nausea: Secondary | ICD-10-CM

## 2022-03-04 ENCOUNTER — Other Ambulatory Visit: Payer: Self-pay

## 2022-03-04 DIAGNOSIS — R110 Nausea: Secondary | ICD-10-CM

## 2022-03-04 MED ORDER — MECLIZINE HCL 25 MG PO TABS: 25.0000 mg | ORAL_TABLET | Freq: Three times a day (TID) | ORAL | 1 refills | Status: DC | PRN

## 2022-03-18 ENCOUNTER — Ambulatory Visit: Payer: 59 | Admitting: Medical-Surgical

## 2022-04-29 NOTE — Progress Notes (Unsigned)
PROVIDER NOTE: Information contained herein reflects review and annotations entered in association with encounter. Interpretation of such information and data should be left to medically-trained personnel. Information provided to patient can be located elsewhere in the medical record under "Patient Instructions". Document created using STT-dictation technology, any transcriptional errors that may result from process are unintentional.    Patient: Mary Cruz  Service Category: E/M  Provider: Gaspar Cola, MD  DOB: 05-22-1975  DOS: 05/01/2022  Referring Provider: Samuel Bouche, NP  MRN: [id number]  Specialty: Interventional Pain Management  PCP: Samuel Bouche, NP  Type: Established Patient  Setting: Ambulatory outpatient    Location: Office  Delivery: Face-to-face     HPI  [name], a 47 y.o. year old female, is here today because of her No primary diagnosis found.. Ms. Southers primary complain today is No chief complaint on file.  Pertinent problems: Ms. Goni has Chronic pain syndrome; Chronic musculoskeletal pain; Osteoarthrosis; Presence of functional implant ([employer] Lumbar spinal cord stimulator implant); Chronic low back pain (Bilateral) (R>L) w/o sciatica; Chronic lumbar radicular pain (Right L5 dermatome; Left S1 Dermatome) (Bilateral) (R>L); Chronic neck pain (1ry area of Pain) (Bilateral) (L>R); Chronic cervical radicular pain (C7 Dermatome) (Bilateral) (L>R); Chronic lower extremity pain (Bilateral) (R>L); Lumbar spondylosis; Lumbar facet syndrome (Bilateral) (R>L); Neurogenic pain; Fibromyalgia; Pain in right knee; Chronic upper extremity pain (2ry area of Pain) (Bilateral) (L>R); Chronic hip pain (3ry area of Pain) (Bilateral) (R>L); Closed fracture of scapula, sequela (Right); DDD (degenerative disc disease), cervical; Cervicalgia; Abnormal MRI, shoulder (Right); Spondylosis without myelopathy or radiculopathy, lumbosacral region; DDD (degenerative disc disease),  lumbar; Osteoarthritis involving multiple joints; Chronic low back pain (Bilateral) (R>L) w/ sciatica (Bilateral) (R>L); Chronic lower extremity pain (Right); Lumbosacral radiculopathy at L5 (Right); Dropfoot (Right); Lumbar facet arthropathy; Intractable low back pain; Closed fracture of multiple ribs of right side; Chronic feet pain (Bilateral); Migraines; Chronic hip pain (Right); Osteoarthritis of hip (Right); Grade 1 Retrolisthesis of L4/L5; Spondylolisthesis at L4-L5 level; Lumbosacral radiculopathy/radiculitis at L4 (Right); Lower extremity numbness (Bilateral); Complaints of weakness of lower extremities (Bilateral); and Abnormal MRI, lumbar spine (11/04/2021) on their pertinent problem list. Pain Assessment: Severity of   is reported as a  /10. Location:    / . Onset:  . Quality:  . Timing:  . Modifying factor(s):  [name] Vitals:  vitals were not taken for this visit.  BMI: Estimated body mass index is 44.73 kg/m as calculated from the following:   Height as of 02/05/22: 5' 4.6" (1.641 m).   Weight as of 02/05/22: 265 lb 8 oz (120.4 kg). Last encounter: 01/23/2022. Last procedure: 07/17/2021.  Reason for encounter: medication management. ***  RTCB: 08/09/2022   Pharmacotherapy Assessment  Analgesic: Oxycodone IR 5 mg, 1 tab PO q 6 hrs (20 mg/day of oxycodone) MME/day: 30 mg/day.   Monitoring: [place] PMP: PDMP reviewed during this encounter.       Pharmacotherapy: No side-effects or adverse reactions reported. Compliance: No problems identified. Effectiveness: Clinically acceptable.  No notes on file  No results found for: "CBDTHCR" No results found for: "D8THCCBX" No results found for: "D9THCCBX"  UDS:  Summary  Date Value Ref Range Status  10/22/2021 Note  Final    Comment:    ==================================================================== [facility] (MW) ==================================================================== Test                             Result  Flag       Units  Drug Present and Declared for Prescription Verification   Oxycodone                      178          EXPECTED   ng/mg creat   Oxymorphone                    261          EXPECTED   ng/mg creat   Noroxycodone                   1146         EXPECTED   ng/mg creat   Noroxymorphone                 143          EXPECTED   ng/mg creat    Sources of oxycodone are scheduled prescription medications.    Oxymorphone, noroxycodone, and noroxymorphone are expected    metabolites of oxycodone. Oxymorphone is also available as a    scheduled prescription medication.  ==================================================================== Test                      Result    Flag   Units      Ref Range   Creatinine              103              mg/dL      >=20 ==================================================================== Declared Medications:  The flagging and interpretation on this report are based on the  following declared medications.  Unexpected results may arise from  inaccuracies in the declared medications.   **Note: The testing scope of this panel includes these medications:   Oxycodone (Roxicodone)   **Note: The testing scope of this panel does not include the  following reported medications:   Amitriptyline (Elavil)  Benzonatate (Tessalon)  Biotin  Duloxetine (Cymbalta)  Hydrochlorothiazide (Zestoretic)  Hyoscyamine (Levsin)  Levocetirizine (Xyzal)  Lisinopril (Zestoretic)  Methocarbamol (Robaxin)  Pantoprazole (Protonix)  Probiotic  Rizatriptan (Maxalt)  Topiramate (Topamax) ==================================================================== For clinical consultation, please call [phone]. ====================================================================       ROS  Constitutional: Denies any fever or chills Gastrointestinal: No reported hemesis, hematochezia, vomiting, or acute GI distress Musculoskeletal: Denies any acute onset joint  swelling, redness, loss of ROM, or weakness Neurological: No reported episodes of acute onset apraxia, aphasia, dysarthria, agnosia, amnesia, paralysis, loss of coordination, or loss of consciousness  Medication Review  Biotin, DULoxetine, Probiotic Product, Rimegepant Sulfate, amitriptyline, hyoscyamine, levocetirizine, lisinopril-hydrochlorothiazide, meclizine, methocarbamol, naloxone, nortriptyline, oxyCODONE, pantoprazole, and rizatriptan  History Review  Allergy: Ms. Lainhart is allergic to celecoxib, doxycycline, gabapentin, latex, nabumetone, pentazocine lactate, seroquel [quetiapine], amoxicillin, bactrim [sulfamethoxazole-trimethoprim], cephalexin, erythromycin, hydromorphone hcl, and lyrica [pregabalin]. Drug: Ms. Wetherholt  reports no history of drug use. Alcohol:  reports no history of alcohol use. Tobacco:  reports that she has been smoking cigarettes. She quit smokeless tobacco use about 4 years ago. Social: Ms. Ortlip  reports that she has been smoking cigarettes. She quit smokeless tobacco use about 4 years ago. She reports that she does not drink alcohol and does not use drugs. Medical:  has a past medical history of Abscess of axillary fold (10/10/2013), Acute bronchitis with asthma with acute exacerbation (01/23/2015), Dehydration (04/19/2019), Elevated lactic acid level (04/19/2019), Encounter for management of implanted device (12/19/2014), Encounter for  therapeutic drug level monitoring (12/19/2014), Fibromyalgia, GERD (gastroesophageal reflux disease), Hyperglycemia (04/19/2019), Hypertension, Implant pain at battery site (right buttocks area) (04/05/2015), KIDNEY STONES (07/02/2007), Long term current use of opiate analgesic (12/19/2014), Morbid obesity ([place]) (12/19/2014), Muscle cramping (12/19/2014), Opiate use (30 MME/Day) (12/19/2014), Osteoarthritis, Pain, Right knee pain (01/10/2015), Sepsis ([place]) (04/19/2019), Sepsis affecting skin (10/10/2013), and Tobacco abuse  (04/19/2019). Surgical: Ms. Kedzior  has a past surgical history that includes spinal cord stimulator; Occipital nerve stimulator insertion; Cholecystectomy (2000); Knee surgery; Shoulder surgery; Tubal ligation (2000); Endometrial ablation; Spinal cord stimulator implant (2008); Knee surgery (Right, 02/2015); Breast surgery; Spinal cord stimulator insertion (N/A, 06/21/2016); Shoulder arthroscopy with distal clavicle resection (Right, 10/29/2018); Subacromial decompression (Right, 10/29/2018); laparoscopic appendectomy (Right, 01/01/2020); Appendectomy; Ankle surgery; Colonoscopy with esophagogastroduodenoscopy (egd); Colonoscopy (N/A, 02/20/2021); and Esophagogastroduodenoscopy (N/A, 02/20/2021). Family: family history includes Asthma in her mother; Depression in her mother; Fibromyalgia in an other family member; Stroke in an other family member; Thyroid disease in an other family member.  Laboratory Chemistry Profile   Renal Lab Results  Component Value Date   BUN 21 (H) 10/13/2021   CREATININE 1.59 (H) 10/13/2021   LABCREA 51 11/20/2020   BCR 16 07/23/2021   GFRAA 84 08/03/2020   GFRNONAA 41 (L) 10/13/2021    Hepatic Lab Results  Component Value Date   AST 16 10/13/2021   ALT 19 10/13/2021   ALBUMIN 3.4 (L) 10/13/2021   ALKPHOS 88 10/13/2021   LIPASE 24 10/13/2021    Electrolytes Lab Results  Component Value Date   NA 141 10/13/2021   K 3.5 10/13/2021   CL 108 10/13/2021   CALCIUM 9.1 10/13/2021   MG 2.0 04/20/2019   PHOS 3.2 04/20/2019    Bone Lab Results  Component Value Date   VD25OH 21 (L) 07/23/2021    Inflammation (CRP: Acute Phase) (ESR: Chronic Phase) Lab Results  Component Value Date   CRP 1.0 (H) 04/05/2015   ESRSEDRATE 28 (H) 11/20/2020   LATICACIDVEN 1.8 10/13/2021         Note: Above Lab results reviewed.  Recent Imaging Review  MR LUMBAR SPINE WO CONTRAST CLINICAL DATA:  Low back pain, symptoms persist with > 6 wks treatment Lumbar radiculopathy,  symptoms persist with > 6 wks treatment  EXAM: MRI LUMBAR SPINE WITHOUT CONTRAST  TECHNIQUE: Multiplanar, multisequence MR imaging of the lumbar spine was performed. No intravenous contrast was administered.  COMPARISON:  MRI of the lumbar spine April 07, 2017.  FINDINGS: Segmentation: Standard segmentation is a soon. The inferior-most fully formed intervertebral disc is labeled L5-S1.  Alignment:  Normal.  Vertebrae: No fracture, evidence of discitis, or suspicious bone lesion.  Conus medullaris and cauda equina: Conus extends to the T12-L1 level. Conus and cauda equina appear normal.  Paraspinal and other soft tissues: Unremarkable.  Disc levels:  T12-L1: No significant disc protrusion, foraminal stenosis, or canal stenosis.  L1-L2: No significant disc protrusion, foraminal stenosis, or canal stenosis.  L2-L3: No significant disc protrusion, foraminal stenosis, or canal stenosis.  L3-L4: No significant disc protrusion, foraminal stenosis, or canal stenosis.  L4-L5: Mild facet arthropathy. Left far lateral/extraforaminal disc protrusion with mild resulting left foraminal stenosis. No significant canal or right foraminal stenosis.  L5-S1: Small left foraminal disc protrusion. Bilateral facet arthropathy. No significant stenosis.  IMPRESSION: Lower lumbar degenerative change without impingement.  Electronically Signed   By: Margaretha Sheffield M.D.   On: 11/04/2021 13:19 Note: Reviewed        Physical Exam  General appearance: Well  nourished, well developed, and well hydrated. In no apparent acute distress Mental status: Alert, oriented x 3 (person, place, & time)       Respiratory: No evidence of acute respiratory distress Eyes: PERLA Vitals: There were no vitals taken for this visit. BMI: Estimated body mass index is 44.73 kg/m as calculated from the following:   Height as of 02/05/22: 5' 4.6" (1.641 m).   Weight as of 02/05/22: 265 lb 8 oz (120.4  kg). Ideal: Patient weight not recorded  Assessment   Diagnosis Status  No diagnosis found. Controlled Controlled Controlled   Updated Problems: No problems updated.  Plan of Care  Problem-specific:  No problem-specific Assessment & Plan notes found for this encounter.  Ms. HEAVEN MEEKER has a current medication list which includes the following long-term medication(s): amitriptyline, duloxetine, hyoscyamine, levocetirizine, lisinopril-hydrochlorothiazide, methocarbamol, nortriptyline, oxycodone, oxycodone, oxycodone, pantoprazole, and rizatriptan.  Pharmacotherapy (Medications Ordered): No orders of the defined types were placed in this encounter.  Orders:  No orders of the defined types were placed in this encounter.  Follow-up plan:   No follow-ups on file.      Interventional Therapies  Risk  Complexity Considerations:   Estimated body mass index is 41.2 kg/m as calculated from the following:   Height as of this encounter: 5\' 4"  (1.626 m).   Weight as of this encounter: 240 lb (108.9 kg). Latex allergy    Planned  Pending:   Diagnostic right hip x-rays and MRI (06/13/2021) Diagnostic/therapeutic right IA hip joint inj. #1    Under consideration:   Diagnostic/therapeutic right IA hip joint inj. #1  Diagnostic right suprascapular NB #1    Completed:   Palliative bilateral lumbar facet MBB x2 (11/16/2020) (90/90/80/85-90)  Therapeutic left lumbar facet MBB x2 (10/10/2016) (100/100/100/100)  Therapeutic right lumbar facet MBB x1 (08/31/2015) (n/a)  Therapeutic right lumbar facet RFA x3 (05/08/2021) (100/100/50/50) on the right side Therapeutic left lumbar facet RFA x3 (03/22/2021) (100/100/99/99) on left side Palliative left C7-T1 CESI x1 (04/13/2015) (n/a)    Therapeutic  Palliative (PRN) options:   Palliative lumbar facet MBB  Palliative lumbar facet RFA  Palliative C7-T1 CESI  PRN SCS programming adjustments (2018 replacement by Dr. Clydell Hakim)    Pharmacotherapy:  Nonopioids transferred 04/06/2020: Robaxin Recommendations:   None at this time.       Recent Visits No visits were found meeting these conditions. Showing recent visits within past 90 days and meeting all other requirements Future Appointments Date Type Provider Dept  05/01/22 Appointment Milinda Pointer, MD [facility]  Showing future appointments within next 90 days and meeting all other requirements  I discussed the assessment and treatment plan with the patient. The patient was provided an opportunity to ask questions and all were answered. The patient agreed with the plan and demonstrated an understanding of the instructions.  Patient advised to call back or seek an in-person evaluation if the symptoms or condition worsens.  Duration of encounter: *** minutes.  Total time on encounter, as per AMA guidelines included both the face-to-face and non-face-to-face time personally spent by the physician and/or other qualified health care professional(s) on the day of the encounter (includes time in activities that require the physician or other qualified health care professional and does not include time in activities normally performed by clinical staff). Physician's time may include the following activities when performed: Preparing to see the patient (e.g., pre-charting review of records, searching for previously ordered imaging, lab work, and nerve conduction tests) Review  of prior analgesic pharmacotherapies. Reviewing PMP Interpreting ordered tests (e.g., lab work, imaging, nerve conduction tests) Performing post-procedure evaluations, including interpretation of diagnostic procedures Obtaining and/or reviewing separately obtained history Performing a medically appropriate examination and/or evaluation Counseling and educating the patient/family/caregiver Ordering medications, tests, or procedures Referring and communicating with other health care  professionals (when not separately reported) Documenting clinical information in the electronic or other health record Independently interpreting results (not separately reported) and communicating results to the patient/ family/caregiver Care coordination (not separately reported)  Note by: Gaspar Cola, MD Date: 05/01/2022; Time: 2:51 PM

## 2022-04-29 NOTE — Patient Instructions (Incomplete)
____________________________________________________________________________________________  Opioid Pain Medication Update  To: All patients taking opioid pain medications. (I.e.: hydrocodone, hydromorphone, oxycodone, oxymorphone, morphine, codeine, methadone, tapentadol, tramadol, buprenorphine, fentanyl, etc.)  Re: Updated review of side effects and adverse reactions of opioid analgesics, as well as new information about long term effects of this class of medications.  Direct risks of long-term opioid therapy are not limited to opioid addiction and overdose. Potential medical risks include serious fractures, breathing problems during sleep, hyperalgesia, immunosuppression, chronic constipation, bowel obstruction, myocardial infarction, and tooth decay secondary to xerostomia.  Unpredictable adverse effects that can occur even if you take your medication correctly: Cognitive impairment, respiratory depression, and death. Most people think that if they take their medication "correctly", and "as instructed", that they will be safe. Nothing could be farther from the truth. In reality, a significant amount of recorded deaths associated with the use of opioids has occurred in individuals that had taken the medication for a long time, and were taking their medication correctly. The following are examples of how this can happen: Patient taking his/her medication for a long time, as instructed, without any side effects, is given a certain antibiotic or another unrelated medication, which in turn triggers a "Drug-to-drug interaction" leading to disorientation, cognitive impairment, impaired reflexes, respiratory depression or an untoward event leading to serious bodily harm or injury, including death.  Patient taking his/her medication for a long time, as instructed, without any side effects, develops an acute impairment of liver and/or kidney function. This will lead to a rapid inability of the body to  breakdown and eliminate their pain medication, which will result in effects similar to an "overdose", but with the same medicine and dose that they had always taken. This again may lead to disorientation, cognitive impairment, impaired reflexes, respiratory depression or an untoward event leading to serious bodily harm or injury, including death.  A similar problem will occur with patients as they grow older and their liver and kidney function begins to decrease as part of the aging process.  Background information: Historically, the original case for using long-term opioid therapy to treat chronic noncancer pain was based on safety assumptions that subsequent experience has called into question. In 1996, the American Pain Society and the [place] [employer] issued a consensus statement supporting long-term opioid therapy. This statement acknowledged the dangers of opioid prescribing but concluded that the risk for addiction was low; respiratory depression induced by opioids was short-lived, occurred mainly in opioid-naive patients, and was antagonized by pain; tolerance was not a common problem; and efforts to control diversion should not constrain opioid prescribing. This has now proven to be wrong. Experience regarding the risks for opioid addiction, misuse, and overdose in community practice has failed to support these assumptions.  According to the Centers for Disease Control and Prevention, fatal overdoses involving opioid analgesics have increased sharply over the past decade. Currently, more than 96,700 people die from drug overdoses every year. Opioids are a factor in 7 out of every 10 overdose deaths. Deaths from drug overdose have surpassed motor vehicle accidents as the leading cause of death for individuals between the ages of 3 and 36.  Clinical data suggest that neuroendocrine dysfunction may be very common in both men and women, potentially causing hypogonadism, erectile  dysfunction, infertility, decreased libido, osteoporosis, and depression. Recent studies linked higher opioid dose to increased opioid-related mortality. Controlled observational studies reported that long-term opioid therapy may be associated with increased risk for cardiovascular events. Subsequent meta-analysis concluded  that the safety of long-term opioid therapy in elderly patients has not been proven.   Side Effects and adverse reactions: Common side effects: Drowsiness (sedation). Dizziness. Nausea and vomiting. Constipation. Physical dependence -- Dependence often manifests with withdrawal symptoms when opioids are discontinued or decreased. Tolerance -- As you take repeated doses of opioids, you require increased medication to experience the same effect of pain relief. Respiratory depression -- This can occur in healthy people, especially with higher doses. However, people with COPD, asthma or other lung conditions may be even more susceptible to fatal respiratory impairment.  Uncommon side effects: An increased sensitivity to feeling pain and extreme response to pain (hyperalgesia). Chronic use of opioids can lead to this. Delayed gastric emptying (the process by which the contents of your stomach are moved into your small intestine). Muscle rigidity. Immune system and hormonal dysfunction. Quick, involuntary muscle jerks (myoclonus). Arrhythmia. Itchy skin (pruritus). Dry mouth (xerostomia).  Long-term side effects: Chronic constipation. Sleep-disordered breathing (SDB). Increased risk of bone fractures. Hypothalamic-pituitary-adrenal dysregulation. Increased risk of overdose.  RISKS: Fractures and Falls:  Opioids increase the risk and incidence of falls. This is of particular importance in elderly patients.  Endocrine System:  Long-term administration is associated with endocrine abnormalities (endocrinopathies). (Also known as Opioid-induced Endocrinopathy) Influences  on both the hypothalamic-pituitary-adrenal axis?and the hypothalamic-pituitary-gonadal axis have been demonstrated with consequent hypogonadism and adrenal insufficiency in both sexes. Hypogonadism and decreased levels of dehydroepiandrosterone sulfate have been reported in men and women. Endocrine effects include: Amenorrhoea in women (abnormal absence of menstruation) Reduced libido in both sexes Decreased sexual function Erectile dysfunction in men Hypogonadisms (decreased testicular function with shrinkage of testicles) Infertility Depression and fatigue Loss of muscle mass Anxiety Depression Immune suppression Hyperalgesia Weight gain Anemia Osteoporosis Patients (particularly women of childbearing age) should avoid opioids. There is insufficient evidence to recommend routine monitoring of asymptomatic patients taking opioids in the long-term for hormonal deficiencies.  Immune System: Human studies have demonstrated that opioids have an immunomodulating effect. These effects are mediated via opioid receptors both on immune effector cells and in the central nervous system. Opioids have been demonstrated to have adverse effects on antimicrobial response and anti-tumour surveillance. Buprenorphine has been demonstrated to have no impact on immune function.  Opioid Induced Hyperalgesia: Human studies have demonstrated that prolonged use of opioids can lead to a state of abnormal pain sensitivity, sometimes called opioid induced hyperalgesia (OIH). Opioid induced hyperalgesia is not usually seen in the absence of tolerance to opioid analgesia. Clinically, hyperalgesia may be diagnosed if the patient on long-term opioid therapy presents with increased pain. This might be qualitatively and anatomically distinct from pain related to disease progression or to breakthrough pain resulting from development of opioid tolerance. Pain associated with hyperalgesia tends to be more diffuse than the  pre-existing pain and less defined in quality. Management of opioid induced hyperalgesia requires opioid dose reduction.  Cancer: Chronic opioid therapy has been associated with an increased risk of cancer among noncancer patients with chronic pain. This association was more evident in chronic strong opioid users. Chronic opioid consumption causes significant pathological changes in the small intestine and colon. Epidemiological studies have found that there is a link between opium dependence and initiation of gastrointestinal cancers. Cancer is the second leading cause of death after cardiovascular disease. Chronic use of opioids can cause multiple conditions such as GERD, immunosuppression and renal damage as well as carcinogenic effects, which are associated with the incidence of cancers.   Mortality: Long-term opioid use   has been associated with increased mortality among patients with chronic non-cancer pain (CNCP).  Prescription of long-acting opioids for chronic noncancer pain was associated with a significantly increased risk of all-cause mortality, including deaths from causes other than overdose.  Reference: Von Korff M, Kolodny A, Deyo RA, Chou R. Long-term opioid therapy reconsidered. Ann Intern Med. 2011 Sep 6;155(5):325-8. doi: 10.7326/[id number]. PMID: [id number]; PMCIDXX:1631110. Morley Kos, Hayward RA, Dunn KM, [place] KP. Risk of adverse events in patients prescribed long-term opioids: A cohort study in the [place] Clinical Practice Research D[employer]. Eur J Pain. 2019 May;23(5):908-922. doi: 10.1002/ejp.1357. Epub 2019 Jan 31. PMID: [id number]. Colameco S, Coren JS, Ciervo CA. Continuous opioid treatment for chronic noncancer pain: a time for moderation in prescribing. Postgrad Med. 2009 Jul;121(4):61-6. doi: 10.3810/pgm.2009.07.2032. PMID: [id number]. Heywood Bene RN, [place] SD, Blazina I, Rosalio Loud, Bougatsos C, Deyo RA. The  effectiveness and risks of long-term opioid therapy for chronic pain: a systematic review for a [employer] of Health Pathways to [employer]. Ann Intern Med. 2015 Feb 17;162(4):276-86. doi: [id number]. PMID: [id number]. Marjory Sneddon [facility], [place] DM. NCHS Data Brief No. 22. [place]: Centers for Disease Control and Prevention; 2009. Sep, Increase in Fatal Poisonings Involving Opioid Analgesics in the [place], 1999-2006. Song IA, Choi HR, Oh TK. Long-term opioid use and mortality in patients with chronic non-cancer pain: Ten-year follow-up study in [place] from 2010 through 2019. EClinicalMedicine. 2022 Jul 18;51:101558. doi: 10.1016/j.eclinm.2022.UB:5887891. PMID: [id number]; PMCIDOX:8550940. Huser, W., Schubert, T., Vogelmann, T. et al. All-cause mortality in patients with long-term opioid therapy compared with non-opioid analgesics for chronic non-cancer pain: a database study. [place] Med 18, 162 (2020). [email or web address] Rashidian H, Roxy Cedar, Malekzadeh R, Haghdoost AA. An Ecological Study of the Association between Opiate Use and Incidence of Cancers. Addict Health. 2016 Fall;8(4):252-260. PMID: [id number]; PMCIDQI:9185013.  Our Goal: Our goal is to control your pain with means other than the use of opioid pain medications.  Our Recommendation: Talk to your physician about coming off of these medications. We can assist you with the tapering down and stopping these medicines. Based on the new information, even if you cannot completely stop the medication, a decrease in the dose may be associated with a lesser risk. Ask for other means of controlling the pain. Decrease or eliminate those factors that significantly contribute to your pain such as smoking, obesity, and a diet heavily tilted towards "inflammatory" nutrients.  Last Updated: 04/10/2022    ____________________________________________________________________________________________     ____________________________________________________________________________________________  Patient Information update  To: All of our patients.  Re: Name change.  It has been made official that our current name, "[facility]"   will soon be changed to "[facility]".   The purpose of this change is to eliminate any confusion created by the concept of our practice being a "Medication Management Pain Clinic". In the past this has led to the misconception that we treat pain primarily by the use of prescription medications.  Nothing can be farther from the truth.   Understanding PAIN MANAGEMENT: To further understand what our practice does, you first have to understand that "Pain Management" is a subspecialty that requires additional training once a physician has completed their specialty training, which can be in either Anesthesia, Neurology, Psychiatry, or Physical Medicine and Rehabilitation (PMR). Each one of these contributes to the final approach taken by each physician to  the management of their patient's pain. To be a "Pain Management Specialist" you must have first completed one of the specialty trainings below.  Anesthesiologists - trained in clinical pharmacology and interventional techniques such as nerve blockade and regional as well as central neuroanatomy. They are trained to block pain before, during, and after surgical interventions.  Neurologists - trained in the diagnosis and pharmacological treatment of complex neurological conditions, such as Multiple Sclerosis, Parkinson's, spinal cord injuries, and other systemic conditions that may be associated with symptoms that may include but are not limited to pain. They tend to rely primarily on the treatment of chronic pain  using prescription medications.  Psychiatrist - trained in conditions affecting the psychosocial wellbeing of patients including but not limited to depression, anxiety, schizophrenia, personality disorders, addiction, and other substance use disorders that may be associated with chronic pain. They tend to rely primarily on the treatment of chronic pain using prescription medications.   Physical Medicine and Rehabilitation (PMR) physicians, also known as physiatrists - trained to treat a wide variety of medical conditions affecting the brain, spinal cord, nerves, bones, joints, ligaments, muscles, and tendons. Their training is primarily aimed at treating patients that have suffered injuries that have caused severe physical impairment. Their training is primarily aimed at the physical therapy and rehabilitation of those patients. They may also work alongside orthopedic surgeons or neurosurgeons using their expertise in assisting surgical patients to recover after their surgeries.  INTERVENTIONAL PAIN MANAGEMENT is sub-subspecialty of Pain Management.  Our physicians are Board-certified in Anesthesia, Pain Management, and Interventional Pain Management.  This meaning that not only have they been trained and Board-certified in their specialty of Anesthesia, and subspecialty of Pain Management, but they have also received further training in the sub-subspecialty of Interventional Pain Management, in order to become Board-certified as INTERVENTIONAL PAIN MANAGEMENT SPECIALIST.    Mission: Our goal is to use our skills in  [place] as alternatives to the chronic use of prescription opioid medications for the treatment of pain. To make this more clear, we have changed our name to reflect what we do and offer. We will continue to offer medication management assessment and recommendations, but we will not be taking over any patient's medication  management.  ____________________________________________________________________________________________     ____________________________________________________________________________________________  National Pain Medication Shortage  The [place] is experiencing worsening drug shortages. These have had a negative widespread effect on patient care and treatment. Not expected to improve any time soon. Predicted to last past 2029.   Drug shortage list (generic names) Oxycodone IR Oxycodone/APAP Oxymorphone IR Hydromorphone Hydrocodone/APAP Morphine  Where is the problem?  Manufacturing and supply level.  Will this shortage affect you?  Only if you take any of the above pain medications.  How? You may be unable to fill your prescription.  Your pharmacist may offer a "partial fill" of your prescription. (Warning: Do not accept partial fills.) Prescriptions partially filled cannot be transferred to another pharmacy. Read our Medication Rules and Regulation. Depending on how much medicine you are dependent on, you may experience withdrawals when unable to get the medication.  Recommendations: Consider ending your dependence on opioid pain medications. Ask your pain specialist to assist you with the process. Consider switching to a medication currently not in shortage, such as Buprenorphine. Talk to your pain specialist about this option. Consider decreasing your pain medication requirements by managing tolerance thru "Drug Holidays". This may help minimize withdrawals, should you run out of medicine. Control your pain thru  the use of non-pharmacological interventional therapies.   Your prescriber: Prescribers cannot be blamed for shortages. Medication manufacturing and supply issues cannot be fixed by the prescriber.   NOTE: The prescriber is not responsible for supplying the medication, or solving supply issues. Work with your pharmacist to solve it. The patient is responsible for  the decision to take or continue taking the medication and for identifying and securing a legal supply source. By law, supplying the medication is the job and responsibility of the pharmacy. The prescriber is responsible for the evaluation, monitoring, and prescribing of these medications.   Prescribers will NOT: Re-issue prescriptions that have been partially filled. Re-issue prescriptions already sent to a pharmacy.  Re-send prescriptions to a different pharmacy because yours did not have your medication. Ask pharmacist to order more medicine or transfer the prescription to another pharmacy. (Read below.)  New 2023 regulation: "October 12, 2021 Revised Regulation Allows DEA-Registered Pharmacies to Transfer Electronic Prescriptions at a Patient's Request [place] Patients now have the ability to request their electronic prescription be transferred to another pharmacy without having to go back to their practitioner to initiate the request. This revised regulation went into effect on Monday, October 08, 2021.     At a patient's request, a DEA-registered retail pharmacy can now transfer an electronic prescription for a controlled substance (schedules II-V) to another DEA-registered retail pharmacy. Prior to this change, patients would have to go through their practitioner to cancel their prescription and have it re-issued to a different pharmacy. The process was taxing and time consuming for both patients and practitioners.    The Drug Enforcement Administration [facility]) published its intent to revise the process for transferring electronic prescriptions on December 31, 2019.  The final rule was published in the federal register on September 06, 2021 and went into effect 30 days later.  Under the final rule, a prescription can only be transferred once between pharmacies, and only if allowed under existing state or other applicable law. The prescription must  remain in its electronic form; may not be altered in any way; and the transfer must be communicated directly between two licensed pharmacists. It's important to note, any authorized refills transfer with the original prescription, which means the entire prescription will be filled at the same pharmacy".  Reference: [employer] [facility] website announcement)  [email or web address].pdf ([place])   [employer] / Vol. 88, No. 143 / Thursday, September 06, 2021 / Rules and Regulations DEPARTMENT OF JUSTICE  Drug Enforcement Administration  21 CFR Part 1306  [Docket No. [id number]]  RIN [id number] Transfer of Electronic Prescriptions for Schedules II-V Controlled Substances Between Pharmacies for Initial Filling  ____________________________________________________________________________________________     ____________________________________________________________________________________________  Transfer of Pain Medication between Pharmacies  Re: 2023 DEA Clarification on existing regulation  Published on DEA Website: October 12, 2021  Title: Revised Regulation Allows DEA-Registered Pharmacies to Conservator, museum/gallery Prescriptions at a Patient's Request [place]  "Patients now have the ability to request their electronic prescription be transferred to another pharmacy without having to go back to their practitioner to initiate the request. This revised regulation went into effect on Monday, October 08, 2021.     At a patient's request, a DEA-registered retail pharmacy can now transfer an electronic prescription for a controlled substance (schedules II-V) to another DEA-registered retail pharmacy. Prior to this change, patients would have to go through their practitioner to  cancel their prescription  and have it re-issued to a different pharmacy. The process was taxing and time consuming for both patients and practitioners.    The Drug Enforcement Administration (DEA) published its intent to revise the process for transferring electronic prescriptions on December 31, 2019.  The final rule was published in the federal register on September 06, 2021 and went into effect 30 days later.  Under the final rule, a prescription can only be transferred once between pharmacies, and only if allowed under existing state or other applicable law. The prescription must remain in its electronic form; may not be altered in any way; and the transfer must be communicated directly between two licensed pharmacists. It's important to note, any authorized refills transfer with the original prescription, which means the entire prescription will be filled at the same pharmacy."    REFERENCES: 1. DEA website announcement https://www.dea.gov/stories/2023/2023-10/2021-09-01/revised-regulation-allows-dea-registered-pharmacies-transfer  2. Department of Justice website  https://www.govinfo.gov/content/pkg/FR-2021-09-06/pdf/2023-15847.pdf  3. DEPARTMENT OF JUSTICE Drug Enforcement Administration 21 CFR Part 1306 [Docket No. [id number]] RIN [id number] "Transfer of Electronic Prescriptions for Schedules II-V Controlled Substances Between Pharmacies for Initial Filling"  ____________________________________________________________________________________________     _______________________________________________________________________  Medication Rules  Purpose: To inform patients, and their family members, of our medication rules and regulations.  Applies to: All patients receiving prescriptions from our practice (written or electronic).  Pharmacy of record: This is the pharmacy where your electronic prescriptions will be sent. Make sure we have the correct one.  Electronic prescriptions: In  compliance with the [place] Strengthen Opioid Misuse Prevention (STOP) Act of 2017 (Session Law 2017-74/H243), effective February 11, 2018, all controlled substances must be electronically prescribed. Written prescriptions, faxing, or calling prescriptions to a pharmacy will no longer be done.  Prescription refills: These will be provided only during in-person appointments. No medications will be renewed without a "face-to-face" evaluation with your provider. Applies to all prescriptions.  NOTE: The following applies primarily to controlled substances (Opioid* Pain Medications).   Type of encounter (visit): For patients receiving controlled substances, face-to-face visits are required. (Not an option and not up to the patient.)  Patient's responsibilities: Pain Pills: Bring all pain pills to every appointment (except for procedure appointments). Pill Bottles: Bring pills in original pharmacy bottle. Bring bottle, even if empty. Always bring the bottle of the most recent fill.  Medication refills: You are responsible for knowing and keeping track of what medications you are taking and when is it that you will need a refill. The day before your appointment: write a list of all prescriptions that need to be refilled. The day of the appointment: give the list to the admitting nurse. Prescriptions will be written only during appointments. No prescriptions will be written on procedure days. If you forget a medication: it will not be "Called in", "Faxed", or "electronically sent". You will need to get another appointment to get these prescribed. No early refills. Do not call asking to have your prescription filled early. Partial  or short prescriptions: Occasionally your pharmacy may not have enough pills to fill your prescription.  NEVER ACCEPT a partial fill or a prescription that is short of the total amount of pills that you were prescribed.  With controlled substances the law allows 72 hours for  the pharmacy to complete the prescription.  If the prescription is not completed within 72 hours, the pharmacist will require a new prescription to be written. This means that you will be short on your medicine and we WILL NOT send another prescription to complete your original   prescription.  Instead, request the pharmacy to send a carrier to a nearby branch to get enough medication to provide you with your full prescription. Prescription Accuracy: You are responsible for carefully inspecting your prescriptions before leaving our office. Have the discharge nurse carefully go over each prescription with you, before taking them home. Make sure that your name is accurately spelled, that your address is correct. Check the name and dose of your medication to make sure it is accurate. Check the number of pills, and the written instructions to make sure they are clear and accurate. Make sure that you are given enough medication to last until your next medication refill appointment. Taking Medication: Take medication as prescribed. When it comes to controlled substances, taking less pills or less frequently than prescribed is permitted and encouraged. Never take more pills than instructed. Never take the medication more frequently than prescribed.  Inform other Doctors: Always inform, all of your healthcare providers, of all the medications you take. Pain Medication from other Providers: You are not allowed to accept any additional pain medication from any other Doctor or Healthcare provider. There are two exceptions to this rule. (see below) In the event that you require additional pain medication, you are responsible for notifying us, as stated below. Cough Medicine: Often these contain an opioid, such as codeine or hydrocodone. Never accept or take cough medicine containing these opioids if you are already taking an opioid* medication. The combination may cause respiratory failure and death. Medication Agreement:  You are responsible for carefully reading and following our Medication Agreement. This must be signed before receiving any prescriptions from our practice. Safely store a copy of your signed Agreement. Violations to the Agreement will result in no further prescriptions. (Additional copies of our Medication Agreement are available upon request.) Laws, Rules, & Regulations: All patients are expected to follow all Federal and [employer], [employer], Rules, [employer] [facility]. Ignorance of the Laws does not constitute a valid excuse.  Illegal drugs and Controlled Substances: The use of illegal substances (including, but not limited to marijuana and its derivatives) and/or the illegal use of any controlled substances is strictly prohibited. Violation of this rule may result in the immediate and permanent discontinuation of any and all prescriptions being written by our practice. The use of any illegal substances is prohibited. Adopted CDC guidelines & recommendations: Target dosing levels will be at or below 60 MME/day. Use of benzodiazepines** is not recommended.  Exceptions: There are only two exceptions to the rule of not receiving pain medications from other Healthcare Providers. Exception #1 (Emergencies): In the event of an emergency (i.e.: accident requiring emergency care), you are allowed to receive additional pain medication. However, you are responsible for: As soon as you are able, call our office [phone], at any time of the day or night, and leave a message stating your name, the date and nature of the emergency, and the name and dose of the medication prescribed. In the event that your call is answered by a member of our staff, make sure to document and save the date, time, and the name of the person that took your information.  Exception #2 (Planned Surgery): In the event that you are scheduled by another doctor or dentist to have any type of surgery or procedure, you are allowed (for a period no  longer than 30 days), to receive additional pain medication, for the acute post-op pain. However, in this case, you are responsible for picking up a copy of  our "Post-op Pain Management for Surgeons" handout, and giving it to your surgeon or dentist. This document is available at our office, and does not require an appointment to obtain it. Simply go to our office during business hours (Monday-Thursday from 8:00 AM to 4:00 PM) (Friday 8:00 AM to 12:00 Noon) or if you have a scheduled appointment with [place], prior to your surgery, and ask for it by name. In addition, you are responsible for: calling our office [phone], at any time of the day or night, and leaving a message stating your name, name of your surgeon, type of surgery, and date of procedure or surgery. Failure to comply with your responsibilities may result in termination of therapy involving the controlled substances. Medication Agreement Violation. Following the above rules, including your responsibilities will help you in avoiding a Medication Agreement Violation ("Breaking your Pain Medication Contract").  Consequences:  Not following the above rules may result in permanent discontinuation of medication prescription therapy.  *Opioid medications include: morphine, codeine, oxycodone, oxymorphone, hydrocodone, hydromorphone, meperidine, tramadol, tapentadol, buprenorphine, fentanyl, methadone. **Benzodiazepine medications include: diazepam (Valium), alprazolam (Xanax), clonazepam (Klonopine), lorazepam (Ativan), clorazepate (Tranxene), chlordiazepoxide (Librium), estazolam (Prosom), oxazepam (Serax), temazepam (Restoril), triazolam (Halcion) (Last updated: 12/04/2021) ______________________________________________________________________    ______________________________________________________________________  Medication Recommendations and Reminders  Applies to: All patients receiving prescriptions (written and/or  electronic).  Medication Rules & Regulations: You are responsible for reading, knowing, and following our "Medication Rules" document. These exist for your safety and that of others. They are not flexible and neither are we. Dismissing or ignoring them is an act of "non-compliance" that may result in complete and irreversible termination of such medication therapy. For safety reasons, "non-compliance" will not be tolerated. As with the [place] fundamental legal principle of "ignorance of the law is no defense", we will accept no excuses for not having read and knowing the content of documents provided to you by our practice.  Pharmacy of record:  Definition: This is the pharmacy where your electronic prescriptions will be sent.  We do not endorse any particular pharmacy. It is up to you and your insurance to decide what pharmacy to use.  We do not restrict you in your choice of pharmacy. However, once we write for your prescriptions, we will NOT be re-sending more prescriptions to fix restricted supply problems created by your pharmacy, or your insurance.  The pharmacy listed in the electronic medical record should be the one where you want electronic prescriptions to be sent. If you choose to change pharmacy, simply notify our nursing staff. Changes will be made only during your regular appointments and not over the phone.  Recommendations: Keep all of your pain medications in a safe place, under lock and key, even if you live alone. We will NOT replace lost, stolen, or damaged medication. We do not accept "Police Reports" as proof of medications having been stolen. After you fill your prescription, take 1 week's worth of pills and put them away in a safe place. You should keep a separate, properly labeled bottle for this purpose. The remainder should be kept in the original bottle. Use this as your primary supply, until it runs out. Once it's gone, then you know that you have 1 week's worth of medicine,  and it is time to come in for a prescription refill. If you do this correctly, it is unlikely that you will ever run out of medicine. To make sure that the above recommendation works, it is very important that you make  sure your medication refill appointments are scheduled at least 1 week before you run out of medicine. To do this in an effective manner, make sure that you do not leave the office without scheduling your next medication management appointment. Always ask the nursing staff to show you in your prescription , when your medication will be running out. Then arrange for the receptionist to get you a return appointment, at least 7 days before you run out of medicine. Do not wait until you have 1 or 2 pills left, to come in. This is very poor planning and does not take into consideration that we may need to cancel appointments due to bad weather, sickness, or emergencies affecting our staff. DO NOT ACCEPT A "Partial Fill": If for any reason your pharmacy does not have enough pills/tablets to completely fill or refill your prescription, do not allow for a "partial fill". The law allows the pharmacy to complete that prescription within 72 hours, without requiring a new prescription. If they do not fill the rest of your prescription within those 72 hours, you will need a separate prescription to fill the remaining amount, which we will NOT provide. If the reason for the partial fill is your insurance, you will need to talk to the pharmacist about payment alternatives for the remaining tablets, but again, DO NOT ACCEPT A PARTIAL FILL, unless you can trust your pharmacist to obtain the remainder of the pills within 72 hours.  Prescription refills and/or changes in medication(s):  Prescription refills, and/or changes in dose or medication, will be conducted only during scheduled medication management appointments. (Applies to both, written and electronic prescriptions.) No refills on procedure days. No  medication will be changed or started on procedure days. No changes, adjustments, and/or refills will be conducted on a procedure day. Doing so will interfere with the diagnostic portion of the procedure. No phone refills. No medications will be "called into the pharmacy". No Fax refills. No weekend refills. No Holliday refills. No after hours refills.  Remember:  Business hours are:  Monday to Thursday 8:00 AM to 4:00 PM Provider's Schedule: Milinda Pointer, MD - Appointments are:  Medication management: Monday and Wednesday 8:00 AM to 4:00 PM Procedure day: Tuesday and Thursday 7:30 AM to 4:00 PM Gillis Santa, MD - Appointments are:  Medication management: Tuesday and Thursday 8:00 AM to 4:00 PM Procedure day: Monday and Wednesday 7:30 AM to 4:00 PM (Last update: 12/04/2021) ______________________________________________________________________    ____________________________________________________________________________________________  Drug Holidays  What is a "Drug Holiday"? Drug Holiday: is the name given to the process of slowly tapering down and temporarily stopping the pain medication for the purpose of decreasing or eliminating tolerance to the drug.  Benefits Improved effectiveness Decreased required effective dose Improved pain control End dependence on high dose therapy Decrease cost of therapy Uncovering "opioid-induced hyperalgesia". (OIH)  What is "opioid hyperalgesia"? It is a paradoxical increase in pain caused by exposure to opioids. Stopping the opioid pain medication, contrary to the expected, it actually decreases or completely eliminates the pain. Ref.: "A comprehensive review of opioid-induced hyperalgesia". Brion Aliment, et.al. Pain Physician. 2011 Mar-Apr;14(2):145-61.  What is tolerance? Tolerance: the progressive loss of effectiveness of a pain medicine due to repetitive use. A common problem of opioid pain medications.  How long should a "Drug  Holiday" last? Effectiveness depends on the patient staying off all opioid pain medicines for a minimum of 14 consecutive days. (2 weeks)  How about just taking less of the medicine? Does not  work. Will not accomplish goal of eliminating the excess receptors.  How about switching to a different pain medicine? (AKA. "Opioid rotation") Does not work. Creates the illusion of effectiveness by taking advantage of inaccurate equivalent dose calculations between different opioids. -This "technique" was promoted by studies funded by pharmaceutical companies, such as [employer], creators of "OxyContin".  Can I stop the medicine "cold turkey"? Depends. You should always coordinate with your Pain Specialist to make the transition as smoothly as possible. Avoid stopping the medicine abruptly without consulting. We recommend a "slow taper".  What is a slow taper? Taper: refers to the gradual decrease in dose.   How do I stop/taper the dose? Slowly. Decrease the daily amount of pills that you take by one (1) pill every seven (7) days. This is called a "slow downward taper". Example: if you normally take four (4) pills per day, drop it to three (3) pills per day for seven (7) days, then to two (2) pills per day for seven (7) days, then to one (1) per day for seven (7) days, and then stop the medicine. The 14 day "Drug Holiday" starts on the first day without medicine.   Will I experience withdrawals? Unlikely with a slow taper.  What triggers withdrawals? Withdrawals are triggered by the sudden/abrupt stop of high dose opioids. Withdrawals can be avoided by slowly decreasing the dose over a prolonged period of time.  What are withdrawals? Symptoms associated with sudden/abrupt reduction/stopping of high-dose, long-term use of pain medication. Withdrawal are seldom seen on low dose therapy, or patients rarely taking opioid medication.  Early Withdrawal Symptoms may include: Agitation Anxiety Muscle  aches Increased tearing Insomnia Runny nose Sweating Yawning  Late symptoms may include: Abdominal cramping Diarrhea Dilated pupils Goose bumps Nausea Vomiting  (Last update: 01/20/2022) ____________________________________________________________________________________________    ____________________________________________________________________________________________  WARNING: CBD (cannabidiol) & Delta (Delta-8 tetrahydrocannabinol) products.   Applicable to:  All individuals currently taking or considering taking CBD (cannabidiol) and, more important, all patients taking opioid analgesic controlled substances (pain medication). (Example: oxycodone; oxymorphone; hydrocodone; hydromorphone; morphine; methadone; tramadol; tapentadol; fentanyl; buprenorphine; butorphanol; dextromethorphan; meperidine; codeine; etc.)  Introduction:  Recently there has been a drive towards the use of "natural" products for the treatment of different conditions, including pain anxiety and sleep disorders. Marijuana and hemp are two varieties of the cannabis genus plants. Marijuana and its derivatives are illegal, while hemp and its derivatives are not. Cannabidiol (CBD) and tetrahydrocannabinol (THC), are two natural compounds found in plants of the Cannabis genus. They can both be extracted from hemp or marijuana. Both compounds interact with your body's endocannabinoid system in very different ways. CBD is associated with pain relief (analgesia) while THC is associated with the psychoactive effects ("the high") obtained from the use of marijuana products. There are two main types of THC: Delta-9, which comes from the marijuana plant and it is illegal, and Delta-8, which comes from the hemp plant, and it is legal. (Both, Delta-9-THC and Delta-8-THC are psychoactive and give you "the high".)   Legality:  Marijuana and its derivatives: illegal Hemp and its derivatives: Legal (State dependent) UPDATE:  (03/30/2021) The Drug Enforcement Agency (DEA) issued a letter stating that "delta" cannabinoids, including Delta-8-THCO and Delta-9-THCO, synthetically derived from hemp do not qualify as hemp and will be viewed as Schedule I drugs. (Schedule I drugs, substances, or chemicals are defined as drugs with no currently accepted medical use and a high potential for abuse. Some examples of Schedule I drugs are: heroin,   lysergic acid diethylamide (LSD), marijuana (cannabis), 3,4-methylenedioxymethamphetamine (ecstasy), methaqualone, and peyote.) (https://jennings.com/)  Legal status of CBD in [place]:  "Conditionally Legal"  Reference: "FDA Regulation of Cannabis and Cannabis-Derived Products, Including Cannabidiol (CBD)" - Se[employer]Art[employer].com.pt  Warning:  CBD is not FDA approved and has not undergo the same manufacturing controls as prescription drugs.  This means that the purity and safety of available CBD may be questionable. Most of the time, despite manufacturer's claims, it is contaminated with THC (delta-9-tetrahydrocannabinol - the chemical in marijuana responsible for the "HIGH").  When this is the case, the [facility] contaminant will trigger a positive urine drug screen (UDS) test for Marijuana (carboxy-THC).   The FDA recently put out a warning about 5 things that everyone should be aware of regarding Delta-8 THC: Delta-8 THC products have not been evaluated or approved by the FDA for safe use and may be marketed in ways that put the public health at risk. The FDA has received adverse event reports involving delta-8 THC-containing products. Delta-8 THC has psychoactive and intoxicating effects. Delta-8 THC manufacturing often involve use of potentially harmful chemicals to create the concentrations of delta-8 THC claimed in the marketplace. The final delta-8 THC product may have potentially harmful  by-products (contaminants) due to the chemicals used in the process. Manufacturing of delta-8 THC products may occur in uncontrolled or unsanitary settings, which may lead to the presence of unsafe contaminants or other potentially harmful substances. Delta-8 THC products should be kept out of the reach of children and pets.  NOTE: Because a positive UDS for any illicit substance is a violation of our medication agreement, your opioid analgesics (pain medicine) may be permanently discontinued.  MORE ABOUT CBD  General Information: CBD was discovered in [birth year] and it is a derivative of the cannabis sativa genus plants (Marijuana and Hemp). It is one of the 113 identified substances found in Marijuana. It accounts for up to 40% of the plant's extract. As of 2018, preliminary clinical studies on CBD included research for the treatment of anxiety, movement disorders, and pain. CBD is available and consumed in multiple forms, including inhalation of smoke or vapor, as an aerosol spray, and by mouth. It may be supplied as an oil containing CBD, capsules, dried cannabis, or as a liquid solution. CBD is thought not to be as psychoactive as THC (delta-9-tetrahydrocannabinol - the chemical in marijuana responsible for the "HIGH"). Studies suggest that CBD may interact with different biological target receptors in the body, including cannabinoid and other neurotransmitter receptors. As of 2018 the mechanism of action for its biological effects has not been determined.  Side-effects  Adverse reactions: Dry mouth, diarrhea, decreased appetite, fatigue, drowsiness, malaise, weakness, sleep disturbances, and others.  Drug interactions:  CBD may interact with medications such as blood-thinners. CBD causes drowsiness on its own and it will increase drowsiness caused by other medications, including antihistamines (such as Benadryl), benzodiazepines (Xanax, Ativan, Valium), antipsychotics, antidepressants, opioids, alcohol  and supplements such as kava, melatonin and St. John's Wort.  Other drug interactions: Brivaracetam (Briviact); Caffeine; Carbamazepine (Tegretol); Citalopram (Celexa); Clobazam (Onfi); Eslicarbazepine (Aptiom); Everolimus (Zostress); Lithium; Methadone (Dolophine); Rufinamide (Banzel); Sedative medications (CNS depressants); Sirolimus (Rapamune); Stiripentol (Diacomit); Tacrolimus (Prograf); Tamoxifen ; Soltamox); Topiramate (Topamax); Valproate; Warfarin (Coumadin); Zonisamide. (Last update: 01/21/2022) ____________________________________________________________________________________________   ____________________________________________________________________________________________  Naloxone Nasal Spray  Why am I receiving this medication? [place] STOP ACT requires that all patients taking high dose opioids or at risk of opioids respiratory depression, be prescribed an opioid reversal agent, such as  Naloxone (AKA: Narcan).  What is this medication? NALOXONE (nal OX one) treats opioid overdose, which causes slow or shallow breathing, severe drowsiness, or trouble staying awake. Call emergency services after using this medication. You may need additional treatment. Naloxone works by reversing the effects of opioids. It belongs to a group of medications called opioid blockers.  COMMON BRAND NAME(S): Kloxxado, Narcan  What should I tell my care team before I take this medication? They need to know if you have any of these conditions: Heart disease Substance use disorder An unusual or allergic reaction to naloxone, other medications, foods, dyes, or preservatives Pregnant or trying to get pregnant Breast-feeding  When to use this medication? This medication is to be used for the treatment of respiratory depression (less than 8 breaths per minute) secondary to opioid overdose.   How to use this medication? This medication is for use in the nose. Lay the person on their back.  Support their neck with your hand and allow the head to tilt back before giving the medication. The nasal spray should be given into 1 nostril. After giving the medication, move the person onto their side. Do not remove or test the nasal spray until ready to use. Get emergency medical help right away after giving the first dose of this medication, even if the person wakes up. You should be familiar with how to recognize the signs and symptoms of a narcotic overdose. If more doses are needed, give the additional dose in the other nostril. Talk to your care team about the use of this medication in children. While this medication may be prescribed for children as young as newborns for selected conditions, precautions do apply.  Naloxone Overdosage: If you think you have taken too much of this medicine contact a poison control center or emergency room at once.  NOTE: This medicine is only for you. Do not share this medicine with others.  What if I miss a dose? This does not apply.  What may interact with this medication? This is only used during an emergency. No interactions are expected during emergency use. This list may not describe all possible interactions. Give your health care provider a list of all the medicines, herbs, non-prescription drugs, or dietary supplements you use. Also tell them if you smoke, drink alcohol, or use illegal drugs. Some items may interact with your medicine.  What should I watch for while using this medication? Keep this medication ready for use in the case of an opioid overdose. Make sure that you have the phone number of your care team and local hospital ready. You may need to have additional doses of this medication. Each nasal spray contains a single dose. Some emergencies may require additional doses. After use, bring the treated person to the nearest hospital or call 911. Make sure the treating care team knows that the person has received a dose of this medication.  You will receive additional instructions on what to do during and after use of this medication before an emergency occurs.  What side effects may I notice from receiving this medication? Side effects that you should report to your care team as soon as possible: Allergic reactions--skin rash, itching, hives, swelling of the face, lips, tongue, or throat Side effects that usually do not require medical attention (report these to your care team if they continue or are bothersome): Constipation Dryness or irritation inside the nose Headache Increase in blood pressure Muscle spasms Stuffy nose Toothache This list may not   describe all possible side effects. Call your doctor for medical advice about side effects. You may report side effects to FDA at 1-800-FDA-1088.  Where should I keep my medication? Because this is an emergency medication, you should keep it with you at all times.  Keep out of the reach of children and pets. Store between 20 and 25 degrees C (68 and 77 degrees F). Do not freeze. Throw away any unused medication after the expiration date. Keep in original box until ready to use.  NOTE: This sheet is a summary. It may not cover all possible information. If you have questions about this medicine, talk to your doctor, pharmacist, or health care provider.   2023 Elsevier/Gold Standard (2020-10-06 00:00:00)  ____________________________________________________________________________________________   

## 2022-05-01 ENCOUNTER — Ambulatory Visit: Payer: BC Managed Care – PPO | Attending: Pain Medicine | Admitting: Pain Medicine

## 2022-05-01 ENCOUNTER — Encounter: Payer: Self-pay | Admitting: Pain Medicine

## 2022-05-01 VITALS — BP 131/82 | HR 114 | Temp 97.1°F | Resp 16 | Ht 64.0 in | Wt 264.0 lb

## 2022-05-01 DIAGNOSIS — Z79899 Other long term (current) drug therapy: Secondary | ICD-10-CM | POA: Insufficient documentation

## 2022-05-01 DIAGNOSIS — M25551 Pain in right hip: Secondary | ICD-10-CM | POA: Diagnosis not present

## 2022-05-01 DIAGNOSIS — M47816 Spondylosis without myelopathy or radiculopathy, lumbar region: Secondary | ICD-10-CM | POA: Insufficient documentation

## 2022-05-01 DIAGNOSIS — Z79891 Long term (current) use of opiate analgesic: Secondary | ICD-10-CM | POA: Insufficient documentation

## 2022-05-01 DIAGNOSIS — M79602 Pain in left arm: Secondary | ICD-10-CM | POA: Insufficient documentation

## 2022-05-01 DIAGNOSIS — M542 Cervicalgia: Secondary | ICD-10-CM | POA: Insufficient documentation

## 2022-05-01 DIAGNOSIS — M25552 Pain in left hip: Secondary | ICD-10-CM | POA: Insufficient documentation

## 2022-05-01 DIAGNOSIS — R937 Abnormal findings on diagnostic imaging of other parts of musculoskeletal system: Secondary | ICD-10-CM | POA: Insufficient documentation

## 2022-05-01 DIAGNOSIS — M5459 Other low back pain: Secondary | ICD-10-CM | POA: Diagnosis not present

## 2022-05-01 DIAGNOSIS — M79601 Pain in right arm: Secondary | ICD-10-CM | POA: Insufficient documentation

## 2022-05-01 DIAGNOSIS — G894 Chronic pain syndrome: Secondary | ICD-10-CM | POA: Insufficient documentation

## 2022-05-01 DIAGNOSIS — G8929 Other chronic pain: Secondary | ICD-10-CM | POA: Insufficient documentation

## 2022-05-01 MED ORDER — OXYCODONE HCL 5 MG PO TABS: 5.0000 mg | ORAL_TABLET | Freq: Four times a day (QID) | ORAL | 0 refills | Status: DC | PRN

## 2022-05-01 NOTE — Progress Notes (Signed)
Nursing Pain Medication Assessment:  Safety precautions to be maintained throughout the outpatient stay will include: orient to surroundings, keep bed in low position, maintain call bell within reach at all times, provide assistance with transfer out of bed and ambulation.  Medication Inspection Compliance: Pill count conducted under aseptic conditions, in front of the patient. Neither the pills nor the bottle was removed from the patient's sight at any time. Once count was completed pills were immediately returned to the patient in their original bottle.  Medication: Oxycodone IR Pill/Patch Count:  119 of 120 pills remain Pill/Patch Appearance: Markings consistent with prescribed medication Bottle Appearance: Standard pharmacy container. Clearly labeled. Filled Date: 03 / 17 / 2024 Last Medication intake:  Today

## 2022-05-06 ENCOUNTER — Telehealth (INDEPENDENT_AMBULATORY_CARE_PROVIDER_SITE_OTHER): Payer: BC Managed Care – PPO | Admitting: Medical-Surgical

## 2022-05-06 ENCOUNTER — Encounter: Payer: Self-pay | Admitting: Medical-Surgical

## 2022-05-06 DIAGNOSIS — K219 Gastro-esophageal reflux disease without esophagitis: Secondary | ICD-10-CM | POA: Diagnosis not present

## 2022-05-06 DIAGNOSIS — R7989 Other specified abnormal findings of blood chemistry: Secondary | ICD-10-CM

## 2022-05-06 DIAGNOSIS — D72829 Elevated white blood cell count, unspecified: Secondary | ICD-10-CM | POA: Diagnosis not present

## 2022-05-06 DIAGNOSIS — R7303 Prediabetes: Secondary | ICD-10-CM | POA: Diagnosis not present

## 2022-05-06 MED ORDER — FAMOTIDINE 20 MG PO TABS: 20.0000 mg | ORAL_TABLET | Freq: Two times a day (BID) | ORAL | 1 refills | Status: DC

## 2022-05-06 NOTE — Progress Notes (Signed)
Virtual Visit via Video Note  I connected with Mary Cruz on 05/06/22 at 11:10 AM EDT by a video enabled telemedicine application and verified that I am speaking with the correct person using two identifiers.   I discussed the limitations of evaluation and management by telemedicine and the availability of in person appointments. The patient expressed understanding and agreed to proceed.  Patient location: home Provider locations: office  Subjective:    CC: discuss labs  HPI: Pleasant 47 year old female presenting today via [place] video visit to discuss labs that were drawn back in September.  She had a visit to the hospital where labs were completed showing that her BUN and creatinine were elevated outside of her baseline.  Also was noted to have a white blood cell count of 16.  Interested in getting her labs rechecked as her pain management provider is concerned about kidney disease.  She is unable to get pain injections until we have investigated this further.  She does have a history of prediabetes and is overdue to have her A1c rechecked.  GERD: Taking pantoprazole 40 mg daily, tolerating well without side effects.  Unfortunately she feels like it is doing nothing for her reflux.  She is working to avoid known food triggers whenever possible.  Past medical history, Surgical history, Family history not pertinant except as noted below, Social history, Allergies, and medications have been entered into the medical record, reviewed, and corrections made.   Review of Systems: See HPI for pertinent positives and negatives.   Objective:    General: Speaking clearly in complete sentences without any shortness of breath.  Alert and oriented x3.  Normal judgment. No apparent acute distress.  Impression and Recommendations:    1. Elevated serum creatinine Serum creatinine at 1.59 in September up.  She did not attend a follow-up appointment for hospital discharge follow-up.  Rechecking  CMP. - COMPLETE METABOLIC PANEL WITH GFR  2. Leukocytosis, unspecified type At the time, she did have a pilonidal abscess which could explain her elevated white blood cell count.  We will recheck her CBC with differential today. - CBC with Differential/Platelet  3. Prediabetes Checking hemoglobin A1c. - Hemoglobin A1c  4. Gastroesophageal reflux disease without esophagitis Continue pantoprazole 40 mg daily.  Adding famotidine 20 mg twice daily.  I discussed the assessment and treatment plan with the patient. The patient was provided an opportunity to ask questions and all were answered. The patient agreed with the plan and demonstrated an understanding of the instructions.   The patient was advised to call back or seek an in-person evaluation if the symptoms worsen or if the condition fails to improve as anticipated.  25 minutes of non-face-to-face time was provided during this encounter.  Return if symptoms worsen or fail to improve.  Clearnce Sorrel, DNP, APRN, FNP-BC [facility]

## 2022-05-08 ENCOUNTER — Encounter: Payer: 59 | Admitting: Neurology

## 2022-05-09 ENCOUNTER — Other Ambulatory Visit: Payer: Self-pay | Admitting: Medical-Surgical

## 2022-05-09 DIAGNOSIS — R110 Nausea: Secondary | ICD-10-CM

## 2022-05-15 ENCOUNTER — Ambulatory Visit: Payer: BC Managed Care – PPO | Admitting: Neurology

## 2022-05-15 ENCOUNTER — Telehealth: Payer: Self-pay | Admitting: Neurology

## 2022-05-15 VITALS — BP 138/86 | HR 103 | Ht 64.0 in | Wt 264.0 lb

## 2022-05-15 DIAGNOSIS — R202 Paresthesia of skin: Secondary | ICD-10-CM

## 2022-05-15 DIAGNOSIS — R0683 Snoring: Secondary | ICD-10-CM | POA: Diagnosis not present

## 2022-05-15 DIAGNOSIS — G43709 Chronic migraine without aura, not intractable, without status migrainosus: Secondary | ICD-10-CM

## 2022-05-15 HISTORY — DX: Chronic migraine without aura, not intractable, without status migrainosus: G43.709

## 2022-05-15 MED ORDER — SUMATRIPTAN SUCCINATE 6 MG/0.5ML ~~LOC~~ SOLN: SUBCUTANEOUS | 11 refills | Status: DC

## 2022-05-15 MED ORDER — ONDANSETRON HCL 4 MG PO TABS: 4.0000 mg | ORAL_TABLET | Freq: Three times a day (TID) | ORAL | 6 refills | Status: DC | PRN

## 2022-05-15 NOTE — Patient Instructions (Signed)
Meds ordered this encounter  Medications   SUMAtriptan (IMITREX) 6 MG/0.5ML SOLN injection    Sig: May repeat in 2 hours if headache persists or recurs.    Dispense:  6 mL    Refill:  11   ondansetron (ZOFRAN) 4 MG tablet    Sig: Take 1 tablet (4 mg total) by mouth every 8 (eight) hours as needed for nausea or vomiting.    Dispense:  20 tablet    Refill:  6     Ok to try Imitrex with zofran and Aleve as needed for prolonged severe headaches.

## 2022-05-15 NOTE — Procedures (Signed)
Full Name: Mary Cruz Gender: Female MRN #: [id number] Date of Birth: [birth year]    Visit Date: 05/15/2022 08:10 Age: 47 Years Examining Physician: Dr. Marcial Pacas Referring Physician: Dr. Marcial Pacas Height: 5 feet 4 inch History: 47 year old female complains of intermittent bilateral hands and feet paresthesia,  Summary of the test: Nerve conduction study: Bilateral sural, superficial peroneal sensory responses were normal.   Bilateral peroneal to EDB and tibial motor responses were within normal limit Bilateral median, ulnar sensory and motor responses were normal.  Bilateral median mixed response showed more than 0.4 ms prolonged latency compared to ipsilateral ulnar mixed response.  Electromyography:  Selected bilateral lower extremity muscles and bilateral lower lumbar paraspinal muscles were normal  Conclusion: This is a mild abnormal study.  There is evidence of mild bilateral median neuropathy across the wrist, consistent with mild bilateral carpal tunnel syndromes.  There is no evidence of large fiber peripheral neuropathy or bilateral lumbosacral radiculopathy.    ------------------------------- Marcial Pacas, M.D. PhD  [facility] [address] [place], [place] [address] Tel: [phone] Fax: [phone]  Verbal informed consent was obtained from the patient, patient was informed of potential risk of procedure, including bruising, bleeding, hematoma formation, infection, muscle weakness, muscle pain, numbness, among others.        [place]    Nerve / Sites Muscle Latency Ref. Amplitude Ref. Rel Amp Segments Distance Velocity Ref. Area    ms ms mV mV %  cm m/s m/s mVms  R Median - APB     Wrist APB 3.6 ?4.4 4.1 ?4.0 100 Wrist - APB 7   12.3     Upper arm APB 8.0  3.7  91.4 Upper arm - Wrist 24 55 ?49 11.7  L Median - APB     Wrist APB 3.8 ?4.4 5.0 ?4.0 100 Wrist - APB 7   20.7     Upper arm APB 7.7  4.8  96.4 Upper arm - Wrist  21.6 55 ?49 20.3  R Ulnar - ADM     Wrist ADM 2.5 ?3.3 7.2 ?6.0 100 Wrist - ADM 7   25.5     B.Elbow ADM 4.5  6.1  85.1 B.Elbow - Wrist 16 78 ?49 21.2     A.Elbow ADM 6.9  6.8  111 A.Elbow - B.Elbow 12 50 ?49 24.3  L Ulnar - ADM     Wrist ADM 2.7 ?3.3 6.1 ?6.0 100 Wrist - ADM 7   23.7     B.Elbow ADM 4.3  7.3  119 B.Elbow - Wrist 10.6 66 ?49 26.6     A.Elbow ADM 6.6  6.7  92.4 A.Elbow - B.Elbow 16 69 ?49 26.2  R Peroneal - EDB     Ankle EDB 4.2 ?6.5 1.1 ?2.0 100 Ankle - EDB 9   3.7     Fib head EDB 11.1  0.8  73.2 Fib head - Ankle 24 35 ?44 2.3     Pop fossa EDB 13.1  1.1  138 Pop fossa - Fib head 11.6 60 ?44 3.3         Pop fossa - Ankle      L Peroneal - EDB     Ankle EDB 4.9 ?6.5 2.3 ?2.0 100 Ankle - EDB 9   8.5     Fib head EDB 11.0  3.9  172 Fib head - Ankle 28 46 ?44 15.7     Pop fossa EDB 12.6  3.8  96.7 Pop fossa - Fib head 10 61 ?44 15.4         Pop fossa - Ankle      R Tibial - AH     Ankle AH 4.4 ?5.8 3.2 ?4.0 100 Ankle - AH 9   7.8     Pop fossa AH 15.5  1.6  49.6 Pop fossa - Ankle 40 36 ?41 3.5  L Tibial - AH     Ankle AH 3.7 ?5.8 12.3 ?4.0 100 Ankle - AH 9   26.7     Pop fossa AH 14.9  6.4  51.6 Pop fossa - Ankle 40 36 ?41 19.0                     SNC    Nerve / Sites Rec. Site Peak Lat Ref.  Amp Ref. Segments Distance Peak Diff Ref.    ms ms V V  cm ms ms  R Sural - Ankle (Calf)     Calf Ankle 2.6 ?4.4 7 ?6 Calf - Ankle 14    L Sural - Ankle (Calf)     Calf Ankle 2.5 ?4.4 12 ?6 Calf - Ankle 14    R Superficial peroneal - Ankle     Lat leg Ankle 3.3 ?4.4 6 ?6 Lat leg - Ankle 14    L Superficial peroneal - Ankle     Lat leg Ankle 3.7 ?4.4 18 ?6 Lat leg - Ankle 14    R Median, Ulnar - Transcarpal comparison     Median Palm Wrist 2.5 ?2.2 34 ?35 Median Palm - Wrist 8       Ulnar Palm Wrist 1.6 ?2.2 16 ?12 Ulnar Palm - Wrist 8          Median Palm - Ulnar Palm  0.9 ?0.4  L Median, Ulnar - Transcarpal comparison     Median Palm Wrist 2.3 ?2.2 34 ?35 Median Palm -  Wrist 8       Ulnar Palm Wrist 1.6 ?2.2 21 ?12 Ulnar Palm - Wrist 8          Median Palm - Ulnar Palm  0.6 ?0.4  R Median - Orthodromic (Dig II, Mid palm)     Dig II Wrist 3.3 ?3.4 11 ?10 Dig II - Wrist 13    L Median - Orthodromic (Dig II, Mid palm)     Dig II Wrist 3.2 ?3.4 10 ?10 Dig II - Wrist 13    R Ulnar - Orthodromic, (Dig V, Mid palm)     Dig V Wrist 2.4 ?3.1 6 ?5 Dig V - Wrist 11    L Ulnar - Orthodromic, (Dig V, Mid palm)     Dig V Wrist 2.3 ?3.1 9 ?5 Dig V - Wrist 60                           F  Wave    Nerve F Lat Ref.   ms ms  R Tibial - AH 59.8 ?56.0  L Tibial - AH 52.4 ?56.0  R Ulnar - ADM 25.9 ?32.0  L Ulnar - ADM 26.7 ?32.0             EMG Summary Table    Spontaneous MUAP Recruitment  Muscle IA Fib PSW Fasc Other Amp Dur. Poly Pattern  R. Tibialis anterior Normal None None None _______ Normal Normal Normal Normal  R. Tibialis posterior Normal None None None _______  Normal Normal Normal Normal  R. Peroneus longus Normal None None None _______ Normal Normal Normal Normal  R. Gastrocnemius (Medial head) Normal None None None _______ Normal Normal Normal Normal  R. Vastus lateralis Normal None None None _______ Normal Normal Normal Normal  L. Tibialis anterior Normal None None None _______ Normal Normal Normal Normal  L. Tibialis posterior Normal None None None _______ Normal Normal Normal Normal  L. Peroneus longus Normal None None None _______ Normal Normal Normal Normal  L. Gastrocnemius (Medial head) Normal None None None _______ Normal Normal Normal Normal  L. Vastus lateralis Normal None None None _______ Normal Normal Normal Normal  R. Lumbar paraspinals (low) Normal None None None _______ Normal Normal Normal Normal  R. Lumbar paraspinals (mid) Normal None None None _______ Normal Normal Normal Normal  L. Lumbar paraspinals (low) Normal None None None _______ Normal Normal Normal Normal  L. Lumbar paraspinals (mid) Normal None None None _______ Normal  Normal Normal Normal

## 2022-05-15 NOTE — Telephone Encounter (Signed)
Take 1 injection at onset of migraine.  May repeat in 2 hrs, if needed.  Max dose: 2 injections/day.

## 2022-05-15 NOTE — Telephone Encounter (Signed)
Pt called stated medication  SUMAtriptan (IMITREX) 6 MG/0.5ML SOLN injection need PA.

## 2022-05-15 NOTE — Progress Notes (Signed)
Chief Complaint  Patient presents with   Procedure    Rm EMG/NCV 4.      ASSESSMENT AND PLAN  Mary Cruz is a 47 y.o. female    Intermittent bilateral hands and feet paresthesia  EMG nerve conduction study confirmed mild bilateral carpal tunnel syndromes, no evidence of large fiber peripheral neuropathy or bilateral lumbosacral radiculopathy,  Chronic migraine headaches  Not a good candidate for Topamax as migraine prevention due to history of kidney stone, stop Topamax,  Tolerating nortriptyline titrating to 20 mg every night, seems to help her symptoms some,  Suboptimal control with Maxalt, Nurtec is not covered by her insurance, will try Imitrex injection, may combine with Aleve, Zofran for prolonged severe headaches Obesity, at risk for obstructive sleep apnea  Referral to sleep study  Weight loss program  Return To Clinic With NP In 6 Months   DIAGNOSTIC DATA (LABS, IMAGING, TESTING) - I reviewed patient records, labs, notes, testing and imaging myself where available.   MEDICAL HISTORY:  Mary Cruz is a 47 year old female, seen in request by her primary care [facility] nurse practitioner Samuel Bouche, for evaluation of bilateral hands, feet paresthesia, initial evaluation was on February 05, 2022  I reviewed and summarized the referring note. PMHx Depression, anxiety HTN Kidney stone. Chronic low back pain, under pain management, s/p spinal cord stimulator placement in May 2018, oxycodone 5mg  x4 tabs a day prn. Chronic migraine Quit smoke since Nov 2023.  She had long history of headaches, getting worse over the past few years, despite taking Topamax 50 mg every night, she has migraine once or twice each week, lateralized severe pounding headache with light noise sensitivity, Maxalt 10 mg as needed was helpful, but she has to take second dose 50% of the time, sleep always helps  She complains of few months history of numbness tingling,  involving all 5 fingers on both hands, to the point she dropped things from her hands, sometimes woke her up from sleep, have to shake her hands to make the sensation come back  Over the past few months, she also experiences constant numbness tingling at the bottom of her feet  She has mild gait abnormality due to previous history of right ankle fracture, required surgery, continue to have discomfort  She denies bowel bladder incontinence  UPDATE May 15 2022: Continue complains of bilateral hand and lower extremity paresthesia, denies gait abnormality, loud snoring, frequent awakening, poor sleep quality, tolerating nortriptyline 20 mg every night, seems to sleep better, help her headache some.  But she continues to have migraine headache 2-3 times each week, lateralized severe pounding headache with light noise sensitivity, lasting few hours to couple days, Maxalt only work part of the time, take longer to kick in  Previous Imitrex injection was helpful  EMG nerve conduction study today showed no evidence of large fiber peripheral neuropathy, no lumbar sacral radiculopathy, mild bilateral carpal tunnel syndromes   PHYSICAL EXAM:   Vitals:   05/15/22 0817  BP: 138/86  Pulse: (!) 103  Weight: 264 lb (119.7 kg)  Height: 5\' 4"  (1.626 m)   Not recorded     Body mass index is 45.32 kg/m.  PHYSICAL EXAMNIATION:  Gen: NAD, conversant, well nourised, well groomed                     Cardiovascular: Regular rate rhythm, no peripheral edema, warm, nontender. Eyes: Conjunctivae clear without exudates or hemorrhage Neck: Supple, no carotid  bruits. Pulmonary: Clear to auscultation bilaterally   NEUROLOGICAL EXAM:  MENTAL STATUS: Speech/cognition: Awake, alert, oriented to history taking and casual conversation, obese CRANIAL NERVES: CN II: Visual fields are full to confrontation. Pupils are round equal and briskly reactive to light. CN III, IV, VI: extraocular movement are  normal. No ptosis. CN V: Facial sensation is intact to light touch CN VII: Face is symmetric with normal eye closure  CN VIII: Hearing is normal to causal conversation. CN IX, X: Phonation is normal. CN XI: Head turning and shoulder shrug are intact CN XII: Narrow oropharyngeal space  MOTOR: There is no pronator drift of out-stretched arms. Muscle bulk and tone are normal. Muscle strength is normal.  REFLEXES: Reflexes are 1 and symmetric at the biceps, triceps, knees, and ankles. Plantar responses are flexor.  SENSORY: Intact to light touch, pinprick and vibratory sensation are intact in fingers and toes.  With exception of decreased pinprick at first 3 finger pads  COORDINATION: There is no trunk or limb dysmetria noted.  GAIT/STANCE: Need push-up to get up from seated position, steady,    REVIEW OF SYSTEMS:  Full 14 system review of systems performed and notable only for as above All other review of systems were negative.   ALLERGIES: Allergies  Allergen Reactions   Celecoxib Swelling    "Body swelling"   Doxycycline Other (See Comments) and Nausea And Vomiting    "SICKNESS"   Gabapentin Swelling    Whole body swelling   Latex Itching and Swelling   Nabumetone Itching   Pentazocine Lactate Itching and Other (See Comments)    HALLUCINATIONS   Seroquel [Quetiapine] Other (See Comments)    Falls   Amoxicillin Diarrhea and Nausea And Vomiting   Bactrim [Sulfamethoxazole-Trimethoprim] Other (See Comments)    "Makes very sick"   Cephalexin Other (See Comments) and Hives    Nausea and causes uti's   Erythromycin Other (See Comments)    RESULTANT UTI   Hydromorphone Hcl Other (See Comments)    HALLUCINATIONS   Lyrica [Pregabalin] Other (See Comments)    "Too sleepy"    HOME MEDICATIONS: Current Outpatient Medications  Medication Sig Dispense Refill   amitriptyline (ELAVIL) 100 MG tablet TAKE 1 TABLET BY MOUTH EVERYDAY AT BEDTIME (Patient taking differently: Take  100 mg by mouth at bedtime.) 90 tablet 3   Biotin 5000 MCG CAPS Take 5,000 mcg by mouth daily.     DULoxetine (CYMBALTA) 60 MG capsule Take 1 capsule (60 mg total) by mouth 2 (two) times daily. 180 capsule 3   famotidine (PEPCID) 20 MG tablet Take 1 tablet (20 mg total) by mouth 2 (two) times daily. 60 tablet 1   hyoscyamine (LEVSIN SL) 0.125 MG SL tablet Place 1 tablet (0.125 mg total) under the tongue daily as needed. 90 tablet 3   levocetirizine (XYZAL) 5 MG tablet Take 1 tablet (5 mg total) by mouth every evening. 90 tablet 3   lisinopril-hydrochlorothiazide (ZESTORETIC) 10-12.5 MG tablet Take 1 tablet by mouth daily. 90 tablet 3   meclizine (ANTIVERT) 25 MG tablet TAKE 1 TABLET(25 MG) BY MOUTH THREE TIMES DAILY AS NEEDED FOR DIZZINESS OR NAUSEA 90 tablet 1   methocarbamol (ROBAXIN) 750 MG tablet Take 1 tablet (750 mg total) by mouth every 8 (eight) hours as needed for muscle spasms. 270 tablet 3   naloxone (NARCAN) nasal spray 4 mg/0.1 mL Place 1 spray into the nose as needed for up to 365 doses (for opioid-induced respiratory depresssion). In case of emergency (  overdose), spray once into each nostril. If no response within 3 minutes, repeat application and call [id number]. 1 each 0   nortriptyline (PAMELOR) 10 MG capsule Take 2 capsules (20 mg total) by mouth at bedtime. 60 capsule 11   oxyCODONE (OXY IR/ROXICODONE) 5 MG immediate release tablet Take 1 tablet (5 mg total) by mouth every 6 (six) hours as needed for severe pain. Must last 30 days 120 tablet 0   oxyCODONE (OXY IR/ROXICODONE) 5 MG immediate release tablet Take 1 tablet (5 mg total) by mouth every 6 (six) hours as needed for severe pain. Must last 30 days 120 tablet 0   [START ON 06/10/2022] oxyCODONE (OXY IR/ROXICODONE) 5 MG immediate release tablet Take 1 tablet (5 mg total) by mouth every 6 (six) hours as needed for severe pain. Must last 30 days 120 tablet 0   [START ON 07/10/2022] oxyCODONE (OXY IR/ROXICODONE) 5 MG immediate release  tablet Take 1 tablet (5 mg total) by mouth every 6 (six) hours as needed for severe pain. Must last 30 days 120 tablet 0   pantoprazole (PROTONIX) 40 MG tablet TAKE 1 TABLET(40 MG) BY MOUTH DAILY 90 tablet 3   Probiotic Product (ALIGN PO) Take 1 tablet by mouth daily.     Rimegepant Sulfate (NURTEC) 75 MG TBDP Take 1 tab at onset of migraine.  May repeat in 2 hrs, if needed.  Max dose: 2 tabs/day. This is a 30 day prescription. 12 tablet 11   rizatriptan (MAXALT) 10 MG tablet Take 1 tablet (10 mg total) by mouth as needed for migraine. May repeat in 2 hours if needed (Patient taking differently: Take 10 mg by mouth daily as needed for migraine. May repeat in 2 hours if needed) 10 tablet 12   No current facility-administered medications for this visit.    PAST MEDICAL HISTORY: Past Medical History:  Diagnosis Date   Abscess of axillary fold 10/10/2013   Acute bronchitis with asthma with acute exacerbation 01/23/2015   Dehydration 04/19/2019   Elevated lactic acid level 04/19/2019   Encounter for management of implanted device 12/19/2014   Encounter for therapeutic drug level monitoring 12/19/2014   Fibromyalgia    GERD (gastroesophageal reflux disease)    Hyperglycemia 04/19/2019   Hypertension    Implant pain at battery site (right buttocks area) 04/05/2015   KIDNEY STONES 07/02/2007   Qualifier: History of  By: Bobby Rumpf CMA (AAMA), [place]     Long term current use of opiate analgesic 12/19/2014   Morbid obesity ([place]) 12/19/2014   Muscle cramping 12/19/2014   Opiate use (30 MME/Day) 12/19/2014   Osteoarthritis    Pain    chronic regional pain syndrome   Right knee pain 01/10/2015   Sepsis ([place]) 04/19/2019   Sepsis affecting skin 10/10/2013   Tobacco abuse 04/19/2019    PAST SURGICAL HISTORY: Past Surgical History:  Procedure Laterality Date   ANKLE SURGERY     APPENDECTOMY     BREAST SURGERY     CHOLECYSTECTOMY  2000   COLONOSCOPY N/A 02/20/2021   Procedure: COLONOSCOPY;   Surgeon: Lesly Rubenstein, MD;  Location: [facility] ENDOSCOPY;  Service: Endoscopy;  Laterality: N/A;   COLONOSCOPY WITH ESOPHAGOGASTRODUODENOSCOPY (EGD)     ENDOMETRIAL ABLATION     ESOPHAGOGASTRODUODENOSCOPY N/A 02/20/2021   Procedure: ESOPHAGOGASTRODUODENOSCOPY (EGD);  Surgeon: Lesly Rubenstein, MD;  Location: [facility] ENDOSCOPY;  Service: Endoscopy;  Laterality: N/A;   KNEE SURGERY     KNEE SURGERY Right 02/2015   LAPAROSCOPIC APPENDECTOMY Right 01/01/2020  Procedure: APPENDECTOMY LAPAROSCOPIC;  Surgeon: Rolm Bookbinder, MD;  Location: [facility] ORS;  Service: General;  Laterality: Right;   OCCIPITAL NERVE STIMULATOR INSERTION     SHOULDER ARTHROSCOPY WITH DISTAL CLAVICLE RESECTION Right 10/29/2018   Procedure: SHOULDER ARTHROSCOPY WITH DISTAL CLAVICLE RESECTION;  Surgeon: Hiram Gash, MD;  Location: [place] [place];  Service: Orthopedics;  Laterality: Right;   SHOULDER SURGERY     spinal cord stimulator     SPINAL CORD STIMULATOR IMPLANT  2008   SPINAL CORD STIMULATOR INSERTION N/A 06/21/2016   Procedure: LUMBAR SPINAL CORD STIMULATOR INSERTION;  Surgeon: Clydell Hakim, MD;  Location: [place];  Service: Neurosurgery;  Laterality: N/A;  LUMBAR SPINAL CORD STIMULATOR INSERTION   SUBACROMIAL DECOMPRESSION Right 10/29/2018   Procedure: SUBACROMIAL DECOMPRESSION;  Surgeon: Hiram Gash, MD;  Location: [place];  Service: Orthopedics;  Laterality: Right;   TUBAL LIGATION  2000    FAMILY HISTORY: Family History  Problem Relation Age of Onset   Depression Mother        maternal grandmother   Asthma Mother    Fibromyalgia Other        aunt   Stroke Other        grandmother   Thyroid disease Other        grandmother    SOCIAL HISTORY: Social History   Socioeconomic History   Marital status: Married    Spouse name: Not on file   Number of children: Not on file   Years of education: Not on file   Highest education level: Not on file  Occupational History    Not on file  Tobacco Use   Smoking status: Former    Types: Cigarettes    Quit date: 05/06/2020    Years since quitting: 2.0   Smokeless tobacco: Former    Quit date: 12/22/2017  Vaping Use   Vaping Use: Former  Substance and Sexual Activity   Alcohol use: No    Alcohol/week: 0.0 standard drinks of alcohol   Drug use: No   Sexual activity: Yes    Partners: Male    Birth control/protection: Surgical  Other Topics Concern   Not on file  Social History Narrative   Not on file   Social Determinants of Health   Financial Resource Strain: Not on file  Food Insecurity: Not on file  Transportation Needs: Not on file  Physical Activity: Not on file  Stress: Not on file  Social Connections: Not on file  Intimate Partner Violence: Not on file      Marcial Pacas, M.D. Ph.D.  [facility] [address] [place], [place] [address] Ph: [phone] Fax: [phone]  CC:  Samuel Bouche, [place],  [place] [address]  Samuel Bouche, NP

## 2022-05-15 NOTE — Telephone Encounter (Signed)
Pt called back. Stating the pharmacy just told her they needed clearer directions for SUMAtriptan (IMITREX) 6 MG/0.5ML SOLN injection.

## 2022-05-15 NOTE — Telephone Encounter (Signed)
Called and LVM with pharmacy updating the instructions for prescription.

## 2022-05-16 ENCOUNTER — Other Ambulatory Visit (HOSPITAL_COMMUNITY): Payer: Self-pay

## 2022-05-16 ENCOUNTER — Other Ambulatory Visit: Payer: Self-pay

## 2022-05-16 MED ORDER — SUMATRIPTAN SUCCINATE 6 MG/0.5ML ~~LOC~~ SOLN: SUBCUTANEOUS | 11 refills | Status: DC

## 2022-05-16 NOTE — Telephone Encounter (Signed)
Per test billing no PA is Needed

## 2022-05-20 ENCOUNTER — Encounter: Payer: Self-pay | Admitting: Neurology

## 2022-05-21 ENCOUNTER — Ambulatory Visit: Payer: BC Managed Care – PPO | Admitting: Medical-Surgical

## 2022-05-21 ENCOUNTER — Encounter: Payer: Self-pay | Admitting: Medical-Surgical

## 2022-05-21 VITALS — BP 130/78 | HR 111 | Resp 20 | Ht 64.0 in | Wt 274.2 lb

## 2022-05-21 DIAGNOSIS — N951 Menopausal and female climacteric states: Secondary | ICD-10-CM

## 2022-05-21 DIAGNOSIS — G8929 Other chronic pain: Secondary | ICD-10-CM

## 2022-05-21 DIAGNOSIS — F419 Anxiety disorder, unspecified: Secondary | ICD-10-CM

## 2022-05-21 DIAGNOSIS — R7303 Prediabetes: Secondary | ICD-10-CM | POA: Diagnosis not present

## 2022-05-21 DIAGNOSIS — R7989 Other specified abnormal findings of blood chemistry: Secondary | ICD-10-CM | POA: Diagnosis not present

## 2022-05-21 DIAGNOSIS — M7918 Myalgia, other site: Secondary | ICD-10-CM

## 2022-05-21 DIAGNOSIS — M797 Fibromyalgia: Secondary | ICD-10-CM

## 2022-05-21 DIAGNOSIS — D72829 Elevated white blood cell count, unspecified: Secondary | ICD-10-CM | POA: Diagnosis not present

## 2022-05-21 DIAGNOSIS — F32A Depression, unspecified: Secondary | ICD-10-CM

## 2022-05-21 MED ORDER — METHOCARBAMOL 750 MG PO TABS
750.0000 mg | ORAL_TABLET | Freq: Three times a day (TID) | ORAL | 3 refills | Status: DC | PRN
Start: 2022-05-21 — End: 2023-06-11

## 2022-05-21 NOTE — Progress Notes (Unsigned)
        Established patient visit  History, exam, impression, and plan:  Primary female hypogonadism Up-to-date testosterone labs.  Due for his next testosterone injection.  Doing well on his current regimen with stable vitals.  Testosterone cypionate 100 mg IM given x 1 in the office.  Due for next shot in 2 weeks.  Lump of skin of lower extremity, left Noted a lump on the left posterior calf approximately 1 month ago.  Initially, had redness, tenderness, swelling at the area however this has fully resolved.  No longer has any residual symptoms but the lump has remained.  His wife noticed it and urged him to be seen to evaluate it.  History notable for varicose veins, compliant with recommendations for compression socks daily.  No recent injury or trauma to the area.  Unclear etiology.  The lump is approximately 1 cm x 1.5 cm and slightly discolored.  See clinical photo.  Plan to get vascular ultrasound as it does seem to be connected to one of the varicose veins that runs through the area.  Suspect superficial thrombophlebitis initially.  Discussed conservative measures with Tylenol, heat, ice, and compression socks.    Procedures performed this visit: None.  Return in about 2 weeks (around 05/22/2022) for nurse visit for testosterone shot.  __________________________________ Carri Spillers L. Krystian Ferrentino, DNP, APRN, FNP-BC Primary Care and Sports Medicine [facility]  

## 2022-05-22 ENCOUNTER — Telehealth: Payer: Self-pay | Admitting: Neurology

## 2022-05-22 DIAGNOSIS — N951 Menopausal and female climacteric states: Secondary | ICD-10-CM | POA: Insufficient documentation

## 2022-05-22 HISTORY — DX: Menopausal and female climacteric states: N95.1

## 2022-05-22 LAB — COMPLETE METABOLIC PANEL WITH GFR
AG Ratio: 1.3 (calc) (ref 1.0–2.5)
ALT: 19 U/L (ref 6–29)
AST: 17 U/L (ref 10–35)
Albumin: 4 g/dL (ref 3.6–5.1)
Alkaline phosphatase (APISO): 84 U/L (ref 31–125)
BUN/Creatinine Ratio: 18 (calc) (ref 6–22)
BUN: 20 mg/dL (ref 7–25)
CO2: 31 mmol/L (ref 20–32)
Calcium: 9.7 mg/dL (ref 8.6–10.2)
Chloride: 103 mmol/L (ref 98–110)
Creat: 1.14 mg/dL — ABNORMAL HIGH (ref 0.50–0.99)
Globulin: 3.2 g/dL (calc) (ref 1.9–3.7)
Glucose, Bld: 103 mg/dL — ABNORMAL HIGH (ref 65–99)
Potassium: 4.1 mmol/L (ref 3.5–5.3)
Sodium: 142 mmol/L (ref 135–146)
Total Bilirubin: 0.2 mg/dL (ref 0.2–1.2)
Total Protein: 7.2 g/dL (ref 6.1–8.1)
eGFR: 60 mL/min/{1.73_m2} (ref >=60)

## 2022-05-22 LAB — HEMOGLOBIN A1C
Hgb A1c MFr Bld: 6.1 % of total Hgb — ABNORMAL HIGH (ref <5.7)
Mean Plasma Glucose: 128 mg/dL
eAG (mmol/L): 7.1 mmol/L

## 2022-05-22 LAB — CBC WITH DIFFERENTIAL/PLATELET
Absolute Monocytes: 598 cells/uL (ref 200–950)
Basophils Absolute: 33 cells/uL (ref 0–200)
Basophils Relative: 0.4 %
Eosinophils Absolute: 149 cells/uL (ref 15–500)
Eosinophils Relative: 1.8 %
HCT: 36.5 % (ref 35.0–45.0)
Hemoglobin: 11.8 g/dL (ref 11.7–15.5)
Lymphs Abs: 1801 cells/uL (ref 850–3900)
MCH: 28 pg (ref 27.0–33.0)
MCHC: 32.3 g/dL (ref 32.0–36.0)
MCV: 86.7 fL (ref 80.0–100.0)
MPV: 11.3 fL (ref 7.5–12.5)
Monocytes Relative: 7.2 %
Neutro Abs: 5719 cells/uL (ref 1500–7800)
Neutrophils Relative %: 68.9 %
Platelets: 338 10*3/uL (ref 140–400)
RBC: 4.21 10*6/uL (ref 3.80–5.10)
RDW: 13.7 % (ref 11.0–15.0)
Total Lymphocyte: 21.7 %
WBC: 8.3 10*3/uL (ref 3.8–10.8)

## 2022-05-22 MED ORDER — VEOZAH 45 MG PO TABS: 45.0000 mg | ORAL_TABLET | Freq: Every day | ORAL | 1 refills | Status: DC

## 2022-05-22 MED ORDER — BUSPIRONE HCL 5 MG PO TABS: 5.0000 mg | ORAL_TABLET | Freq: Two times a day (BID) | ORAL | 1 refills | Status: DC | PRN

## 2022-05-22 NOTE — Telephone Encounter (Signed)
I attempted to call the patient to answer questions regarding sumatriptan. She was unable to answer the phone.  I spoke with the pharmacist to inquire about what syringes are needed. A diabetic syringe needs to be sent to the pharmacy.

## 2022-05-22 NOTE — Telephone Encounter (Signed)
Patient have some Imitrex subcutaneous injection related question, please call her to clarify sending supplies as needed

## 2022-05-25 ENCOUNTER — Emergency Department (HOSPITAL_COMMUNITY): Payer: BC Managed Care – PPO

## 2022-05-25 ENCOUNTER — Encounter (HOSPITAL_COMMUNITY): Payer: Self-pay

## 2022-05-25 ENCOUNTER — Emergency Department (HOSPITAL_COMMUNITY)
Admission: EM | Admit: 2022-05-25 | Discharge: 2022-05-26 | Disposition: A | Discharge status: Home / Self Care | Payer: BC Managed Care – PPO | Attending: Emergency Medicine | Admitting: Emergency Medicine

## 2022-05-25 DIAGNOSIS — J45909 Unspecified asthma, uncomplicated: Secondary | ICD-10-CM | POA: Insufficient documentation

## 2022-05-25 DIAGNOSIS — M797 Fibromyalgia: Secondary | ICD-10-CM | POA: Diagnosis present

## 2022-05-25 DIAGNOSIS — I10 Essential (primary) hypertension: Secondary | ICD-10-CM | POA: Diagnosis not present

## 2022-05-25 DIAGNOSIS — R000 Tachycardia, unspecified: Secondary | ICD-10-CM | POA: Insufficient documentation

## 2022-05-25 DIAGNOSIS — R7309 Other abnormal glucose: Secondary | ICD-10-CM | POA: Diagnosis not present

## 2022-05-25 DIAGNOSIS — Z20822 Contact with and (suspected) exposure to covid-19: Secondary | ICD-10-CM | POA: Diagnosis not present

## 2022-05-25 DIAGNOSIS — R509 Fever, unspecified: Secondary | ICD-10-CM | POA: Diagnosis not present

## 2022-05-25 DIAGNOSIS — G8929 Other chronic pain: Secondary | ICD-10-CM | POA: Diagnosis present

## 2022-05-25 DIAGNOSIS — J40 Bronchitis, not specified as acute or chronic: Secondary | ICD-10-CM | POA: Diagnosis not present

## 2022-05-25 DIAGNOSIS — Z79899 Other long term (current) drug therapy: Secondary | ICD-10-CM | POA: Insufficient documentation

## 2022-05-25 DIAGNOSIS — J209 Acute bronchitis, unspecified: Secondary | ICD-10-CM | POA: Diagnosis not present

## 2022-05-25 DIAGNOSIS — S2231XA Fracture of one rib, right side, initial encounter for closed fracture: Secondary | ICD-10-CM | POA: Diagnosis not present

## 2022-05-25 DIAGNOSIS — Z91040 Latex allergy status: Secondary | ICD-10-CM | POA: Insufficient documentation

## 2022-05-25 DIAGNOSIS — F32A Depression, unspecified: Secondary | ICD-10-CM | POA: Diagnosis present

## 2022-05-25 DIAGNOSIS — J206 Acute bronchitis due to rhinovirus: Secondary | ICD-10-CM | POA: Diagnosis not present

## 2022-05-25 DIAGNOSIS — F419 Anxiety disorder, unspecified: Secondary | ICD-10-CM | POA: Diagnosis present

## 2022-05-25 DIAGNOSIS — D649 Anemia, unspecified: Secondary | ICD-10-CM | POA: Diagnosis not present

## 2022-05-25 DIAGNOSIS — D72829 Elevated white blood cell count, unspecified: Secondary | ICD-10-CM | POA: Diagnosis not present

## 2022-05-25 DIAGNOSIS — R0602 Shortness of breath: Secondary | ICD-10-CM | POA: Diagnosis not present

## 2022-05-25 DIAGNOSIS — E876 Hypokalemia: Secondary | ICD-10-CM | POA: Insufficient documentation

## 2022-05-25 DIAGNOSIS — J9601 Acute respiratory failure with hypoxia: Secondary | ICD-10-CM | POA: Diagnosis not present

## 2022-05-25 DIAGNOSIS — Z79891 Long term (current) use of opiate analgesic: Secondary | ICD-10-CM

## 2022-05-25 LAB — GROUP A STREP BY PCR: Group A Strep by PCR: NOT DETECTED

## 2022-05-25 LAB — SARS CORONAVIRUS 2 BY RT PCR: SARS Coronavirus 2 by RT PCR: NEGATIVE

## 2022-05-25 MED ORDER — IBUPROFEN 800 MG PO TABS
800.0000 mg | ORAL_TABLET | Freq: Once | ORAL | Status: AC
  Administered 2022-05-25: 800 mg via ORAL
  Filled 2022-05-25: qty 1

## 2022-05-25 NOTE — ED Triage Notes (Signed)
Pt arrived POV for SOB, sore throat, bilateral ear pain, headache and fever, as well as generalized body pain. Reports granddaughter has been sick as well. Pt report staking OTC cold medication and Tylenol, last dose at 4 pm.

## 2022-05-26 ENCOUNTER — Emergency Department (HOSPITAL_COMMUNITY): Payer: BC Managed Care – PPO

## 2022-05-26 DIAGNOSIS — R0602 Shortness of breath: Secondary | ICD-10-CM | POA: Diagnosis not present

## 2022-05-26 DIAGNOSIS — J206 Acute bronchitis due to rhinovirus: Secondary | ICD-10-CM

## 2022-05-26 DIAGNOSIS — R509 Fever, unspecified: Secondary | ICD-10-CM | POA: Diagnosis not present

## 2022-05-26 LAB — CBC WITH DIFFERENTIAL/PLATELET
Abs Immature Granulocytes: 0.04 K/uL (ref 0.00–0.07)
Basophils Absolute: 0 K/uL (ref 0.0–0.1)
Basophils Relative: 0 %
Eosinophils Absolute: 0.3 K/uL (ref 0.0–0.5)
Eosinophils Relative: 3 %
HCT: 35.3 % — ABNORMAL LOW (ref 36.0–46.0)
Hemoglobin: 11.3 g/dL — ABNORMAL LOW (ref 12.0–15.0)
Immature Granulocytes: 0 %
Lymphocytes Relative: 10 %
Lymphs Abs: 1.2 K/uL (ref 0.7–4.0)
MCH: 28.3 pg (ref 26.0–34.0)
MCHC: 32 g/dL (ref 30.0–36.0)
MCV: 88.3 fL (ref 80.0–100.0)
Monocytes Absolute: 0.8 K/uL (ref 0.1–1.0)
Monocytes Relative: 7 %
Neutro Abs: 9.2 K/uL — ABNORMAL HIGH (ref 1.7–7.7)
Neutrophils Relative %: 80 %
Platelets: 262 K/uL (ref 150–400)
RBC: 4 MIL/uL (ref 3.87–5.11)
RDW: 14.5 % (ref 11.5–15.5)
WBC: 11.6 K/uL — ABNORMAL HIGH (ref 4.0–10.5)
nRBC: 0 % (ref 0.0–0.2)

## 2022-05-26 LAB — BASIC METABOLIC PANEL WITH GFR
Anion gap: 10 (ref 5–15)
BUN: 15 mg/dL (ref 6–20)
CO2: 23 mmol/L (ref 22–32)
Calcium: 8.6 mg/dL — ABNORMAL LOW (ref 8.9–10.3)
Chloride: 102 mmol/L (ref 98–111)
Creatinine, Ser: 0.99 mg/dL (ref 0.44–1.00)
GFR, Estimated: >60 mL/min
Glucose, Bld: 111 mg/dL — ABNORMAL HIGH (ref 70–99)
Potassium: 3.4 mmol/L — ABNORMAL LOW (ref 3.5–5.1)
Sodium: 135 mmol/L (ref 135–145)

## 2022-05-26 LAB — TROPONIN I (HIGH SENSITIVITY)
Troponin I (High Sensitivity): 3 ng/L
Troponin I (High Sensitivity): 3 ng/L

## 2022-05-26 LAB — RESPIRATORY PANEL BY PCR

## 2022-05-26 LAB — D-DIMER, QUANTITATIVE: D-Dimer, Quant: 0.66 ug/mL-FEU — ABNORMAL HIGH (ref 0.00–0.50)

## 2022-05-26 MED ORDER — IOHEXOL 350 MG/ML SOLN
100.0000 mL | Freq: Once | INTRAVENOUS | Status: AC | PRN
  Administered 2022-05-26: 75 mL via INTRAVENOUS

## 2022-05-26 MED ORDER — DOXYCYCLINE HYCLATE 100 MG PO TABS
100.0000 mg | ORAL_TABLET | Freq: Once | ORAL | Status: AC
  Administered 2022-05-26: 100 mg via ORAL
  Filled 2022-05-26: qty 1

## 2022-05-26 MED ORDER — ALBUTEROL SULFATE (2.5 MG/3ML) 0.083% IN NEBU
INHALATION_SOLUTION | RESPIRATORY_TRACT | Status: AC
  Filled 2022-05-26: qty 12

## 2022-05-26 MED ORDER — ALBUTEROL SULFATE (2.5 MG/3ML) 0.083% IN NEBU
2.5000 mg | INHALATION_SOLUTION | Freq: Once | RESPIRATORY_TRACT | Status: AC
  Administered 2022-05-26: 2.5 mg via RESPIRATORY_TRACT
  Filled 2022-05-26: qty 3

## 2022-05-26 MED ORDER — SODIUM CHLORIDE 0.9 % IV SOLN
1.0000 g | Freq: Once | INTRAVENOUS | Status: AC
  Administered 2022-05-26: 1 g via INTRAVENOUS
  Filled 2022-05-26: qty 10

## 2022-05-26 MED ORDER — METHYLPREDNISOLONE SODIUM SUCC 125 MG IJ SOLR
125.0000 mg | Freq: Once | INTRAMUSCULAR | Status: AC
  Administered 2022-05-26: 125 mg via INTRAVENOUS
  Filled 2022-05-26: qty 2

## 2022-05-26 MED ORDER — SODIUM CHLORIDE 0.9 % IV BOLUS
1000.0000 mL | Freq: Once | INTRAVENOUS | Status: AC
  Administered 2022-05-26: 1000 mL via INTRAVENOUS

## 2022-05-26 MED ORDER — MAGNESIUM SULFATE 2 GM/50ML IV SOLN
2.0000 g | Freq: Once | INTRAVENOUS | Status: AC
  Administered 2022-05-26: 2 g via INTRAVENOUS
  Filled 2022-05-26: qty 50

## 2022-05-26 MED ORDER — ALBUTEROL SULFATE HFA 108 (90 BASE) MCG/ACT IN AERS: 2.0000 | INHALATION_SPRAY | Freq: Four times a day (QID) | RESPIRATORY_TRACT | 2 refills | Status: DC | PRN

## 2022-05-26 MED ORDER — ALBUTEROL SULFATE (2.5 MG/3ML) 0.083% IN NEBU
10.0000 mg | INHALATION_SOLUTION | Freq: Once | RESPIRATORY_TRACT | Status: AC
  Administered 2022-05-26: 10 mg via RESPIRATORY_TRACT

## 2022-05-26 MED ORDER — ACETAMINOPHEN 325 MG PO TABS
650.0000 mg | ORAL_TABLET | Freq: Once | ORAL | Status: AC
  Administered 2022-05-26: 650 mg via ORAL
  Filled 2022-05-26: qty 2

## 2022-05-26 MED ORDER — IPRATROPIUM-ALBUTEROL 0.5-2.5 (3) MG/3ML IN SOLN
3.0000 mL | Freq: Once | RESPIRATORY_TRACT | Status: AC
  Administered 2022-05-26: 3 mL via RESPIRATORY_TRACT
  Filled 2022-05-26: qty 3

## 2022-05-26 MED ORDER — PREDNISONE 10 MG (21) PO TBPK: ORAL_TABLET | ORAL | 0 refills | Status: DC

## 2022-05-26 MED ORDER — ONDANSETRON HCL 4 MG/2ML IJ SOLN
4.0000 mg | Freq: Once | INTRAMUSCULAR | Status: AC
  Administered 2022-05-26: 4 mg via INTRAVENOUS
  Filled 2022-05-26: qty 2

## 2022-05-26 NOTE — ED Notes (Signed)
Dr. Kirby Crigler wanted to ambulate patient to see oxygen saturation and if patient could tolerate it without coughing. Patients oxygen saturation remained above 95% with ambulation and patient did not cough.

## 2022-05-26 NOTE — Consult Note (Addendum)
ER Consultation Note   Patient: Mary Cruz NWG:[id number] DOB: 08/04/1975 PCP: Christen Butter, NP DOA: 05/25/2022 DOS: the patient was seen and examined on 05/26/2022 Primary service: Tilden Fossa, MD  Referring physician: Dr. Madilyn Hook Reason for consult: Hypoxia  Assessment/Plan:  This is a pleasant 47 year old female with a history of hypertension, fibromyalgia, chronic pain, asthma, GERD and an approximately 8-10-pack-year history of tobacco abuse with ongoing vaping admitted to the hospital with a viral URI found to be positive for rhinovirus.  This is likely simply a viral syndrome, she had some transient hypoxia on room air, but has responded very well to treatment in the ER including IV steroids, and breathing treatments.  The patient is back to baseline on room air, without any cough, wheezing or dyspnea.  I think she is medically stable for discharge home directly from the emergency department.  I will discharge her home this morning with oral steroid taper, and albuterol inhaler as needed.  Patient was instructed to follow-up with her PCP within the next couple weeks, or sooner if she feels unwell.  She was also instructed to come back to the ER if she has recurrence or worsening of her symptoms.  Plan of care was discussed in detail with the patient and her husband at the bedside this morning, and they are agreeable with discharge home this morning.   HPI: Mary Cruz is a 47 y.o. female with past medical history of hypertension, fibromyalgia, asthma, GERD and an approximately 8-10-pack-year history of smoking who presented to the hospital with complaints of URI-like symptoms, including fevers, chills, cough, congestion and general body aches that started yesterday 4/13.  States that she had some pleuritic chest pain when taking deep breaths, feeling short of breath, and having some wheezing at home.  She quit smoking about a year ago, but does continue to vape.  She has no formal  pulmonary diagnosis.  Upon evaluation in the ER, she did initially have a temperature of 100.7 and was tachycardic to 113, blood pressure was stable and she initially was saturating 98% on room air.  Later in the hospital stay, when she was sleeping her oxygen dropped to 86% on room air and she was placed on 2 L of oxygen.  She received several breathing treatments, as well as IV Zosyn empirically for possible atypical pulmonary infection.  She had chest x-ray as well as CT angiogram of the chest which did not reveal any PE, and showed no evidence of active cardiopulmonary disease.  On my examination of the patient this morning, I removed her oxygen and she continued to look comfortable and was saturating 94% or above on room air.  This was true even with ambulation.  She is no longer coughing, and feels much better.  Review of Systems: A complete 10 system review of systems was done with the patient, and negative except as mentioned in the HPI above.  Past Medical History:  Diagnosis Date   Abscess of axillary fold 10/10/2013   Acute bronchitis with asthma with acute exacerbation 01/23/2015   Dehydration 04/19/2019   Elevated lactic acid level 04/19/2019   Encounter for management of implanted device 12/19/2014   Encounter for therapeutic drug level monitoring 12/19/2014   Fibromyalgia    GERD (gastroesophageal reflux disease)    Hyperglycemia 04/19/2019   Hypertension    Implant pain at battery site (right buttocks area) 04/05/2015   KIDNEY STONES 07/02/2007   Qualifier: History of  By: Melvyn Neth CMA (AAMA), Patty  Long term current use of opiate analgesic 12/19/2014   Morbid obesity 12/19/2014   Muscle cramping 12/19/2014   Opiate use (30 MME/Day) 12/19/2014   Osteoarthritis    Pain    chronic regional pain syndrome   Right knee pain 01/10/2015   Sepsis 04/19/2019   Sepsis affecting skin 10/10/2013   Tobacco abuse 04/19/2019   Past Surgical History:  Procedure Laterality Date    ANKLE SURGERY     APPENDECTOMY     BREAST SURGERY     CHOLECYSTECTOMY  2000   COLONOSCOPY N/A 02/20/2021   Procedure: COLONOSCOPY;  Surgeon: Regis Bill, MD;  Location: [facility] ENDOSCOPY;  Service: Endoscopy;  Laterality: N/A;   COLONOSCOPY WITH ESOPHAGOGASTRODUODENOSCOPY (EGD)     ENDOMETRIAL ABLATION     ESOPHAGOGASTRODUODENOSCOPY N/A 02/20/2021   Procedure: ESOPHAGOGASTRODUODENOSCOPY (EGD);  Surgeon: Regis Bill, MD;  Location[facility];  Service: Endoscopy;  Laterality: N/A;   KNEE SURGERY     KNEE SURGERY Right 02/2015   LAPAROSCOPIC APPENDECTOMY Right 01/01/2020   Procedure: APPENDECTOMY LAPAROSCOPIC;  Surgeon: Emelia Loron, MD;  Location: [facility] ORS;  Service: General;  Laterality: Right;   OCCIPITAL NERVE STIMULATOR INSERTION     SHOULDER ARTHROSCOPY WITH DISTAL CLAVICLE RESECTION Right 10/29/2018   Procedure: SHOULDER ARTHROSCOPY WITH DISTAL CLAVICLE RESECTION;  Surgeon: Bjorn Pippin, MD;  Location: [facility];  Service: Orthopedics;  Laterality: Right;   SHOULDER SURGERY     spinal cord stimulator     SPINAL CORD STIMULATOR IMPLANT  2008   SPINAL CORD STIMULATOR INSERTION N/A 06/21/2016   Procedure: LUMBAR SPINAL CORD STIMULATOR INSERTION;  Surgeon: Odette Fraction, MD;  Location: [facility] OR;  Service: Neurosurgery;  Laterality: N/A;  LUMBAR SPINAL CORD STIMULATOR INSERTION   SUBACROMIAL DECOMPRESSION Right 10/29/2018   Procedure: SUBACROMIAL DECOMPRESSION;  Surgeon: Bjorn Pippin, MD;  Location: [facility];  Service: Orthopedics;  Laterality: Right;   TUBAL LIGATION  2000   Social History:  reports that she quit smoking about 2 years ago. Her smoking use included cigarettes. She quit smokeless tobacco use about 4 years ago. She reports that she does not drink alcohol and does not use drugs.  Allergies  Allergen Reactions   Celecoxib Swelling    "Body swelling"   Doxycycline Other (See Comments) and Nausea And Vomiting     "SICKNESS"   Gabapentin Swelling    Whole body swelling   Latex Itching and Swelling   Nabumetone Itching   Pentazocine Lactate Itching and Other (See Comments)    HALLUCINATIONS   Seroquel [Quetiapine] Other (See Comments)    Falls   Amoxicillin Diarrhea and Nausea And Vomiting   Bactrim [Sulfamethoxazole-Trimethoprim] Other (See Comments)    "Makes very sick"   Cephalexin Other (See Comments) and Hives    Nausea and causes uti's   Erythromycin Other (See Comments)    RESULTANT UTI   Hydromorphone Hcl Other (See Comments)    HALLUCINATIONS   Lyrica [Pregabalin] Other (See Comments)    "Too sleepy"    Family History  Problem Relation Age of Onset   Depression Mother        maternal grandmother   Asthma Mother    Fibromyalgia Other        aunt   Stroke Other        grandmother   Thyroid disease Other        grandmother    Prior to Admission medications   Medication Sig Start Date End Date Taking? Authorizing  Provider  albuterol (VENTOLIN HFA) 108 (90 Base) MCG/ACT inhaler Inhale 2 puffs into the lungs every 6 (six) hours as needed for wheezing or shortness of breath. 05/26/22  Yes Kirby Crigler, Brock Larmon M, MD  busPIRone (BUSPAR) 5 MG tablet Take 1-3 tablets (5-15 mg total) by mouth 2 (two) times daily as needed. Patient taking differently: Take 5-15 mg by mouth 2 (two) times daily as needed (anxiety). 05/22/22  Yes Christen Butter, NP  DULoxetine (CYMBALTA) 60 MG capsule Take 1 capsule (60 mg total) by mouth 2 (two) times daily. 12/17/21  Yes Christen Butter, NP  famotidine (PEPCID) 20 MG tablet Take 1 tablet (20 mg total) by mouth 2 (two) times daily. 05/06/22  Yes Jessup, Joy, NP  Fezolinetant (VEOZAH) 45 MG TABS Take 1 tablet (45 mg total) by mouth daily. 05/22/22  Yes Christen Butter, NP  hyoscyamine (LEVSIN SL) 0.125 MG SL tablet Place 1 tablet (0.125 mg total) under the tongue daily as needed. Patient taking differently: Place 0.125 mg under the tongue daily as needed for cramping. 07/23/21   Yes Christen Butter, NP  levocetirizine (XYZAL) 5 MG tablet Take 1 tablet (5 mg total) by mouth every evening. 07/23/21  Yes Christen Butter, NP  lisinopril-hydrochlorothiazide (ZESTORETIC) 10-12.5 MG tablet Take 1 tablet by mouth daily. 07/23/21  Yes Christen Butter, NP  meclizine (ANTIVERT) 25 MG tablet TAKE 1 TABLET(25 MG) BY MOUTH THREE TIMES DAILY AS NEEDED FOR DIZZINESS OR NAUSEA 05/09/22  Yes Christen Butter, NP  methocarbamol (ROBAXIN) 750 MG tablet Take 1 tablet (750 mg total) by mouth every 8 (eight) hours as needed for muscle spasms. 05/21/22  Yes Christen Butter, NP  naloxone [facility]) nasal spray 4 mg/0.1 mL Place 1 spray into the nose as needed for up to 365 doses (for opioid-induced respiratory depresssion). In case of emergency (overdose), spray once into each nostril. If no response within 3 minutes, repeat application and call 911. 01/23/22 01/23/23 Yes Delano Metz, MD  nortriptyline (PAMELOR) 10 MG capsule Take 20 mg by mouth at bedtime.   Yes [provider]  ondansetron (ZOFRAN) 4 MG tablet Take 1 tablet (4 mg total) by mouth every 8 (eight) hours as needed for nausea or vomiting. 05/15/22  Yes Levert Feinstein, MD  oxyCODONE (OXY IR/ROXICODONE) 5 MG immediate release tablet Take 1 tablet (5 mg total) by mouth every 6 (six) hours as needed for severe pain. Must last 30 days 05/11/22 06/10/22 Yes Delano Metz, MD  oxyCODONE (OXY IR/ROXICODONE) 5 MG immediate release tablet Take 1 tablet (5 mg total) by mouth every 6 (six) hours as needed for severe pain. Must last 30 days 06/10/22 07/10/22 Yes Delano Metz, MD  pantoprazole (PROTONIX) 40 MG tablet TAKE 1 TABLET(40 MG) BY MOUTH DAILY 10/23/21  Yes Christen Butter, NP  Probiotic Product (ALIGN PO) Take 1 tablet by mouth daily.   Yes [provider]  SUMAtriptan (IMITREX) 6 MG/0.5ML SOLN injection Take at headache onset May repeat in 2 hours if headache persists or recurs. 05/16/22  Yes Levert Feinstein, MD  oxyCODONE (OXY IR/ROXICODONE) 5 MG  immediate release tablet Take 1 tablet (5 mg total) by mouth every 6 (six) hours as needed for severe pain. Must last 30 days 04/11/22 05/11/22  Delano Metz, MD  oxyCODONE (OXY IR/ROXICODONE) 5 MG immediate release tablet Take 1 tablet (5 mg total) by mouth every 6 (six) hours as needed for severe pain. Must last 30 days 07/10/22 08/09/22  Delano Metz, MD   Allergies as of 05/26/2022  Reactions   Celecoxib Swelling   "Body swelling"   Doxycycline Other (See Comments), Nausea And Vomiting   "SICKNESS"   Gabapentin Swelling   Whole body swelling   Latex Itching, Swelling   Nabumetone Itching   Pentazocine Lactate Itching, Other (See Comments)   HALLUCINATIONS   Seroquel [quetiapine] Other (See Comments)   Falls   Amoxicillin Diarrhea, Nausea And Vomiting   Bactrim [sulfamethoxazole-trimethoprim] Other (See Comments)   "Makes very sick"   Cephalexin Other (See Comments), Hives   Nausea and causes uti's   Erythromycin Other (See Comments)   RESULTANT UTI   Hydromorphone Hcl Other (See Comments)   HALLUCINATIONS   Lyrica [pregabalin] Other (See Comments)   "Too sleepy"        Medication List     STOP taking these medications    amitriptyline 100 MG tablet Commonly known as: ELAVIL       TAKE these medications    albuterol 108 (90 Base) MCG/ACT inhaler Commonly known as: VENTOLIN HFA Inhale 2 puffs into the lungs every 6 (six) hours as needed for wheezing or shortness of breath.   ALIGN PO Take 1 tablet by mouth daily.   busPIRone 5 MG tablet Commonly known as: BUSPAR Take 1-3 tablets (5-15 mg total) by mouth 2 (two) times daily as needed. What changed: reasons to take this   DULoxetine 60 MG capsule Commonly known as: Cymbalta Take 1 capsule (60 mg total) by mouth 2 (two) times daily.   famotidine 20 MG tablet Commonly known as: PEPCID Take 1 tablet (20 mg total) by mouth 2 (two) times daily.   hyoscyamine 0.125 MG SL tablet Commonly known  as: LEVSIN SL Place 1 tablet (0.125 mg total) under the tongue daily as needed. What changed: reasons to take this   levocetirizine 5 MG tablet Commonly known as: XYZAL Take 1 tablet (5 mg total) by mouth every evening.   lisinopril-hydrochlorothiazide 10-12.5 MG tablet Commonly known as: ZESTORETIC Take 1 tablet by mouth daily.   meclizine 25 MG tablet Commonly known as: ANTIVERT TAKE 1 TABLET(25 MG) BY MOUTH THREE TIMES DAILY AS NEEDED FOR DIZZINESS OR NAUSEA   methocarbamol 750 MG tablet Commonly known as: ROBAXIN Take 1 tablet (750 mg total) by mouth every 8 (eight) hours as needed for muscle spasms.   naloxone 4 MG/0.1ML Liqd nasal spray kit Commonly known as: NARCAN Place 1 spray into the nose as needed for up to 365 doses (for opioid-induced respiratory depresssion). In case of emergency (overdose), spray once into each nostril. If no response within 3 minutes, repeat application and call 911.   nortriptyline 10 MG capsule Commonly known as: PAMELOR Take 20 mg by mouth at bedtime.   ondansetron 4 MG tablet Commonly known as: Zofran Take 1 tablet (4 mg total) by mouth every 8 (eight) hours as needed for nausea or vomiting.   oxyCODONE 5 MG immediate release tablet Commonly known as: Oxy IR/ROXICODONE Take 1 tablet (5 mg total) by mouth every 6 (six) hours as needed for severe pain. Must last 30 days   oxyCODONE 5 MG immediate release tablet Commonly known as: Oxy IR/ROXICODONE Take 1 tablet (5 mg total) by mouth every 6 (six) hours as needed for severe pain. Must last 30 days   oxyCODONE 5 MG immediate release tablet Commonly known as: Oxy IR/ROXICODONE Take 1 tablet (5 mg total) by mouth every 6 (six) hours as needed for severe pain. Must last 30 days Start taking on: June 10, 2022  oxyCODONE 5 MG immediate release tablet Commonly known as: Oxy IR/ROXICODONE Take 1 tablet (5 mg total) by mouth every 6 (six) hours as needed for severe pain. Must last 30  days Start taking on: Jul 10, 2022   pantoprazole 40 MG tablet Commonly known as: PROTONIX TAKE 1 TABLET(40 MG) BY MOUTH DAILY   predniSONE 10 MG (21) Tbpk tablet Commonly known as: STERAPRED UNI-PAK 21 TAB Per package instruction   SUMAtriptan 6 MG/0.5ML Soln injection Commonly known as: Imitrex Take at headache onset May repeat in 2 hours if headache persists or recurs.   Veozah 45 MG Tabs Generic drug: Fezolinetant Take 1 tablet (45 mg total) by mouth daily.         Physical Exam: Vitals:   05/26/22 0430 05/26/22 0500 05/26/22 0600 05/26/22 0700  BP: 109/63 111/70 129/85 135/78  Pulse: (!) 106 100 95 91  Resp: (!) 35 Temp:   98 F (36.7 C)   TempSrc:   Oral   SpO2: 92% 94% 94% 94%  Weight:      Height:      General:  Alert, oriented, calm, in no acute distress, no cough, husband at bedside, patient is speaking in full sentences Eyes: EOMI, clear conjuctivae, white sclerea Neck: supple, no masses, trachea mildline  Cardiovascular: RRR, no murmurs or rubs, no peripheral edema  Respiratory: clear to auscultation bilaterally with good bilateral air entry, no wheezes, no crackles  Abdomen: soft, nontender, nondistended, normal bowel tones heard  Skin: dry, no rashes  Musculoskeletal: no joint effusions, normal range of motion  Psychiatric: appropriate affect, normal speech  Neurologic: extraocular muscles intact, clear speech, moving all extremities with intact sensorium  Data Reviewed:      Latest Ref Rng & Units 05/26/2022    1:45 AM 05/21/2022    3:48 PM 10/13/2021    5:57 PM  CBC  WBC 4.0 - 10.5 K/uL 11.6  8.3  16.0   Hemoglobin 12.0 - 15.0 g/dL 81.1  91.4  78.2   Hematocrit 36.0 - 46.0 % 35.3  36.5  38.9   Platelets 150 - 400 K/uL 262  338  317       Latest Ref Rng & Units 05/26/2022    1:45 AM 05/21/2022    3:48 PM 10/13/2021    5:57 PM  BMP  Glucose 70 - 99 mg/dL 956  213  086   BUN 6 - 20 mg/dL Creatinine 0.44 - 1.00 mg/dL 5.78   4.69  6.29   BUN/Creat Ratio 6 - 22 (calc)  18    Sodium 135 - 145 mmol/L 135  142  141   Potassium 3.5 - 5.1 mmol/L 3.4  4.1  3.5   Chloride 98 - 111 mmol/L 102  103  108   CO2 22 - 32 mmol/L Calcium 8.9 - 10.3 mg/dL 8.6  9.7  9.1    Chest x-ray and CT chest reviewed, with no evidence of active or acute cardiopulmonary disease.  Author: Emmajo Bennette Sharlette Dense, MD 05/26/2022 9:03 AM  For on call review [email or web address].

## 2022-05-26 NOTE — ED Provider Notes (Signed)
[place] EMERGENCY DEPARTMENT AT [facility] Provider Note   CSN: [id number] Arrival date & time: 05/25/22  2110     History  No chief complaint on file.   Mary Cruz is a 47 y.o. female.  HPI   Patient with medical history including hypertension, fibromyalgia, asthma, GERD, presenting with complaints of URI-like symptoms.  Started yesterday, endorses fevers chills cough congestion headaches general body aches.  States she has slight nausea vomiting, states that she has some pleuritic chest pain mainly on her right chest, states she does feel short of breath and slightly tight sounding, she endorses wheezing, she has had a productive cough.  No actual chest pain, no history of PEs or DVTs currently not on birth control, no recent surgeries no long immobilization.  She she has no cardiac history being treated for hypertension, hyperlipidemia, she does vape.  Home Medications Prior to Admission medications   Medication Sig Start Date End Date Taking? Authorizing Provider  busPIRone (BUSPAR) 5 MG tablet Take 1-3 tablets (5-15 mg total) by mouth 2 (two) times daily as needed. Patient taking differently: Take 5-15 mg by mouth 2 (two) times daily as needed (anxiety). 05/22/22  Yes Christen Butter, NP  DULoxetine (CYMBALTA) 60 MG capsule Take 1 capsule (60 mg total) by mouth 2 (two) times daily. 12/17/21  Yes Christen Butter, NP  famotidine (PEPCID) 20 MG tablet Take 1 tablet (20 mg total) by mouth 2 (two) times daily. 05/06/22  Yes Jessup, Joy, NP  Fezolinetant (VEOZAH) 45 MG TABS Take 1 tablet (45 mg total) by mouth daily. 05/22/22  Yes Christen Butter, NP  hyoscyamine (LEVSIN SL) 0.125 MG SL tablet Place 1 tablet (0.125 mg total) under the tongue daily as needed. Patient taking differently: Place 0.125 mg under the tongue daily as needed for cramping. 07/23/21  Yes Christen Butter, NP  levocetirizine (XYZAL) 5 MG tablet Take 1 tablet (5 mg total) by mouth every evening. 07/23/21  Yes Christen Butter, NP  lisinopril-hydrochlorothiazide (ZESTORETIC) 10-12.5 MG tablet Take 1 tablet by mouth daily. 07/23/21  Yes Christen Butter, NP  meclizine (ANTIVERT) 25 MG tablet TAKE 1 TABLET(25 MG) BY MOUTH THREE TIMES DAILY AS NEEDED FOR DIZZINESS OR NAUSEA 05/09/22  Yes Christen Butter, NP  methocarbamol (ROBAXIN) 750 MG tablet Take 1 tablet (750 mg total) by mouth every 8 (eight) hours as needed for muscle spasms. 05/21/22  Yes Christen Butter, NP  naloxone [facility]) nasal spray 4 mg/0.1 mL Place 1 spray into the nose as needed for up to 365 doses (for opioid-induced respiratory depresssion). In case of emergency (overdose), spray once into each nostril. If no response within 3 minutes, repeat application and call 911. 01/23/22 01/23/23 Yes Delano Metz, MD  nortriptyline (PAMELOR) 10 MG capsule Take 20 mg by mouth at bedtime.   Yes [provider]  ondansetron (ZOFRAN) 4 MG tablet Take 1 tablet (4 mg total) by mouth every 8 (eight) hours as needed for nausea or vomiting. 05/15/22  Yes Levert Feinstein, MD  oxyCODONE (OXY IR/ROXICODONE) 5 MG immediate release tablet Take 1 tablet (5 mg total) by mouth every 6 (six) hours as needed for severe pain. Must last 30 days 05/11/22 06/10/22 Yes Delano Metz, MD  oxyCODONE (OXY IR/ROXICODONE) 5 MG immediate release tablet Take 1 tablet (5 mg total) by mouth every 6 (six) hours as needed for severe pain. Must last 30 days 06/10/22 07/10/22 Yes Delano Metz, MD  pantoprazole (PROTONIX) 40 MG tablet TAKE 1 TABLET(40 MG) BY MOUTH  DAILY 10/23/21  Yes Christen Butter, NP  Probiotic Product (ALIGN PO) Take 1 tablet by mouth daily.   Yes [provider]  SUMAtriptan (IMITREX) 6 MG/0.5ML SOLN injection Take at headache onset May repeat in 2 hours if headache persists or recurs. 05/16/22  Yes Levert Feinstein, MD  amitriptyline (ELAVIL) 100 MG tablet TAKE 1 TABLET BY MOUTH EVERYDAY AT BEDTIME Patient not taking: Reported on 05/26/2022 07/23/21   Christen Butter, NP  oxyCODONE (OXY  IR/ROXICODONE) 5 MG immediate release tablet Take 1 tablet (5 mg total) by mouth every 6 (six) hours as needed for severe pain. Must last 30 days 04/11/22 05/11/22  Delano Metz, MD  oxyCODONE (OXY IR/ROXICODONE) 5 MG immediate release tablet Take 1 tablet (5 mg total) by mouth every 6 (six) hours as needed for severe pain. Must last 30 days 07/10/22 08/09/22  Delano Metz, MD      Allergies    Celecoxib, Doxycycline, Gabapentin, Latex, Nabumetone, Pentazocine lactate, Seroquel [quetiapine], Amoxicillin, Bactrim [sulfamethoxazole-trimethoprim], Cephalexin, Erythromycin, Hydromorphone hcl, and Lyrica [pregabalin]    Review of Systems   Review of Systems  Constitutional:  Negative for chills and fever.  Respiratory:  Positive for chest tightness and shortness of breath.   Cardiovascular:  Negative for chest pain.  Gastrointestinal:  Negative for abdominal pain.  Neurological:  Negative for headaches.    Physical Exam Updated Vital Signs BP 111/70   Pulse 100   Temp (!) 100.7 F (38.2 C) (Oral)   Resp 17   Ht 5\' 4"  (1.626 m)   Wt 124.3 kg   LMP 05/14/2022 (Approximate)   SpO2 94%   BMI 47.03 kg/m  Physical Exam Vitals and nursing note reviewed.  Constitutional:      General: She is not in acute distress.    Appearance: She is not ill-appearing.  HENT:     Head: Normocephalic and atraumatic.     Nose: No congestion.  Eyes:     Conjunctiva/sclera: Conjunctivae normal.  Cardiovascular:     Rate and Rhythm: Regular rhythm. Tachycardia present.     Pulses: Normal pulses.     Heart sounds: No murmur heard.    No friction rub. No gallop.  Pulmonary:     Effort: No respiratory distress.     Breath sounds: No wheezing, rhonchi or rales.     Comments: Patient is tight sounding, with wheezing heard throughout the upper and lower lobes, no noted Rales or rhonchi present. Abdominal:     Palpations: Abdomen is soft.     Tenderness: There is no abdominal tenderness. There is  no right CVA tenderness or left CVA tenderness.  Musculoskeletal:     Right lower leg: No edema.     Left lower leg: No edema.     Comments: No calf tenderness no real leg swelling no palpable cords.  Skin:    General: Skin is warm and dry.  Neurological:     Mental Status: She is alert.  Psychiatric:        Mood and Affect: Mood normal.     ED Results / Procedures / Treatments   Labs (all labs ordered are listed, but only abnormal results are displayed) Labs Reviewed  BASIC METABOLIC PANEL - Abnormal; Notable for the following components:      Result Value   Potassium 3.4 (*)    Glucose, Bld 111 (*)    Calcium 8.6 (*)    All other components within normal limits  CBC WITH DIFFERENTIAL/PLATELET - Abnormal; Notable for  the following components:   WBC 11.6 (*)    Hemoglobin 11.3 (*)    HCT 35.3 (*)    Neutro Abs 9.2 (*)    All other components within normal limits  D-DIMER, QUANTITATIVE - Abnormal; Notable for the following components:   D-Dimer, Quant 0.66 (*)    All other components within normal limits  GROUP A STREP BY PCR  SARS CORONAVIRUS 2 BY RT PCR  RESPIRATORY PANEL BY PCR  TROPONIN I (HIGH SENSITIVITY)  TROPONIN I (HIGH SENSITIVITY)    EKG EKG Interpretation  Date/Time:  Sunday May 26 2022 01:41:13 EDT Ventricular Rate:  92 PR Interval:  154 QRS Duration: 95 QT Interval:  367 QTC Calculation: 454 R Axis:   38 Text Interpretation: Sinus rhythm Low voltage, precordial leads Otherwise within normal limits When compared with ECG of 01/13/2021, No significant change was found Confirmed by Dione Booze ([id number]) on 05/26/2022 5:25:57 AM  Radiology CT Angio Chest PE W and/or Wo Contrast  Result Date: 05/26/2022 CLINICAL DATA:  Shortness of breath, fever EXAM: CT ANGIOGRAPHY CHEST WITH CONTRAST TECHNIQUE: Multidetector CT imaging of the chest was performed using the standard protocol during bolus administration of intravenous contrast. Multiplanar CT image  reconstructions and MIPs were obtained to evaluate the vascular anatomy. RADIATION DOSE REDUCTION: This exam was performed according to the departmental dose-optimization program which includes automated exposure control, adjustment of the mA and/or kV according to patient size and/or use of iterative reconstruction technique. CONTRAST:  69mL OMNIPAQUE IOHEXOL 350 MG/ML SOLN COMPARISON:  Chest radiograph dated 05/25/2022. CTA chest dated 04/19/2019. FINDINGS: Cardiovascular: Satisfactory opacification of the bilateral pulmonary arteries to the lobar level. No evidence of pulmonary embolism. Although not tailored for evaluation of the thoracic aorta, there is no evidence thoracic aortic aneurysm or dissection. Heart is top-normal in size.  No pericardial effusion. Mediastinum/Nodes: No suspicious mediastinal lymphadenopathy. Visualized thyroid is unremarkable. Lungs/Pleura: Evaluation lung parenchyma is constrained by respiratory motion. Within that constraint, there is a 7 x 3 mm triangular subpleural nodule along the minor fissure (series 6/image 86), unchanged from 2021, benign. No follow-up is recommended. No suspicious pulmonary nodules. No focal consolidation. No pleural effusion or pneumothorax. Upper Abdomen: Visualized upper abdomen is notable for a prior cholecystectomy. Musculoskeletal: Thoracic spine stimulator. Thoracic spine is within normal limits. Review of the MIP images confirms the above findings. IMPRESSION: No evidence of pulmonary embolism. No evidence of acute cardiopulmonary disease. Electronically Signed   By: Charline Bills M.D.   On: 05/26/2022 03:54   DG Chest 2 View  Result Date: 05/25/2022 CLINICAL DATA:  sob EXAM: CHEST - 2 VIEW COMPARISON:  Chest x-ray 06/24/2020 FINDINGS: The heart and mediastinal contours are within normal limits. No focal consolidation. No pulmonary edema. No pleural effusion. No pneumothorax. No acute osseous abnormality. Old healed right rib fractures.  Neurostimulator leads overlying the posterior central canal of the lower thoracic spine. Right upper quadrant surgical clips. IMPRESSION: No active cardiopulmonary disease. Electronically Signed   By: Tish Frederickson M.D.   On: 05/25/2022 22:39    Procedures .Critical Care  Performed by: Carroll Sage, PA-C Authorized by: Carroll Sage, PA-C   Critical care provider statement:    Critical care time (minutes):  30   Critical care time was exclusive of:  Separately billable procedures and treating other patients   Critical care was necessary to treat or prevent imminent or life-threatening deterioration of the following conditions:  Respiratory failure   Critical care was time spent personally  by me on the following activities:  Development of treatment plan with patient or surrogate, discussions with consultants, evaluation of patient's response to treatment, examination of patient, ordering and review of laboratory studies, ordering and review of radiographic studies, ordering and performing treatments and interventions, pulse oximetry, re-evaluation of patient's condition and review of old charts   I assumed direction of critical care for this patient from another provider in my specialty: no     Care discussed with: admitting provider       Medications Ordered in ED Medications  albuterol (PROVENTIL) (2.5 MG/3ML) 0.083% nebulizer solution (  Not Given 05/26/22 0223)  magnesium sulfate IVPB 2 g 50 mL (2 g Intravenous New Bag/Given 05/26/22 0514)  doxycycline (VIBRA-TABS) tablet 100 mg (has no administration in time range)  cefTRIAXone (ROCEPHIN) 1 g in sodium chloride 0.9 % 100 mL IVPB (has no administration in time range)  ibuprofen (ADVIL) tablet 800 mg (800 mg Oral Given 05/25/22 2213)  ipratropium-albuterol (DUONEB) 0.5-2.5 (3) MG/3ML nebulizer solution 3 mL (3 mLs Nebulization Given 05/26/22 0148)  methylPREDNISolone sodium succinate (SOLU-MEDROL) 125 mg/2 mL injection 125 mg  (125 mg Intravenous Given 05/26/22 0154)  sodium chloride 0.9 % bolus 1,000 mL (0 mLs Intravenous Stopped 05/26/22 0403)  ondansetron (ZOFRAN) injection 4 mg (4 mg Intravenous Given 05/26/22 0154)  albuterol (PROVENTIL) (2.5 MG/3ML) 0.083% nebulizer solution 10 mg (10 mg Nebulization Given 05/26/22 0157)  iohexol (OMNIPAQUE) 350 MG/ML injection 100 mL (75 mLs Intravenous Contrast Given 05/26/22 0336)  albuterol (PROVENTIL) (2.5 MG/3ML) 0.083% nebulizer solution 2.5 mg (2.5 mg Nebulization Given 05/26/22 0401)  acetaminophen (TYLENOL) tablet 650 mg (650 mg Oral Given 05/26/22 9147)    ED Course/ Medical Decision Making/ A&P                             Medical Decision Making Amount and/or Complexity of Data Reviewed Labs: ordered. Radiology: ordered.  Risk OTC drugs. Prescription drug management. Decision regarding hospitalization.   This patient presents to the ED for concern of URI, this involves an extensive number of treatment options, and is a complaint that carries with it a high risk of complications and morbidity.  The differential diagnosis includes acute bronchitis, pneumonia, PE, ACS, CHF    Additional history obtained:  Additional history obtained from husband at side External records from outside source obtained and reviewed including PCP notes   Co morbidities that complicate the patient evaluation  Hypertension, hyperlipidemia,  Social Determinants of Health:  Current vapor    Lab Tests:  I Ordered, and personally interpreted labs.  The pertinent results include: CBC shows leukocytosis of 11.6, hemoglobin 11.3, BMP reveals potassium 3.4, glucose 111, calcium 8.6, D-dimer elevated 0.66, negative delta troponin, COVID-negative   Imaging Studies ordered:  I ordered imaging studies including chest x-ray, CT of chest I independently visualized and interpreted imaging which showed both negative acute findings I agree with the radiologist  interpretation   Cardiac Monitoring:  The patient was maintained on a cardiac monitor.  I personally viewed and interpreted the cardiac monitored which showed an underlying rhythm of: EKG sinus tach without signs of ischemia   Medicines ordered and prescription drug management:  I ordered medication including bronchodilators, steroids, I have reviewed the patients home medicines and have made adjustments as needed  Critical Interventions:  Hypoxic-started on 2 L via nasal cannula Reassessed remains in the low to mid 90s.   Reevaluation:  Presents with likely URI,  will obtain basic lab workup imaging, will add on a D-dimer she does endorse pleuritic chest pain and is tachycardic.  D-dimer is elevated, will send down for CTA chest, lung sounds reassessed after first DuoNeb still tight sounding, will provide steroids, and additional nebulizer.  Patient was reassessed she is hypoxic, in the mid 80s, she is awake, seems slightly tachypneic, will start her on 2 L via nasal cannula will provide with additional albuterol treatment  Patient still requiring supplemental oxygen, CTA is negative, chest is still slightly tight sounding, will provide with magnesium admit to medicine for acute bronchitis exacerbation  Consultations Obtained:  I requested consultation with the Janee Morn,  and discussed lab and imaging findings as well as pertinent plan - they recommend: She will admit the patient Patient has multiple medication allergies, consult with pharmacist who confirms the patient is had Zosyn on her last few visits without any significant reactions, will start patient on ceftriaxone and doxycycline for coverage of possible atypical pneumonias.    Test Considered:  N/A    Rule out I have low suspicion for ACS as history is atypical, patient has no cardiac history, EKG was sinus rhythm without signs of ischemia, patient had negative troponin.  Low suspicion for PE  AAA or aortic  dissection as history is atypical, patient has low risk factors, CT imaging is negative. low suspicion for systemic infection as patient is nontoxic-appearing, vital signs reassuring, no obvious source infection noted on exam.     Dispostion and problem list  After consideration of the diagnostic results and the patients response to treatment, I feel that the patent would benefit from admission.  Acute respiratory failure-likely this is viral bronchitis, patient will likely need continued bronchodilators steroids, and further management.            Final Clinical Impression(s) / ED Diagnoses Final diagnoses:  Bronchitis  Acute respiratory failure with hypoxia    Rx / DC Orders ED Discharge Orders     None         Carroll Sage, PA-C 05/26/22 0601    Tilden Fossa, MD 05/26/22 [phone]

## 2022-05-26 NOTE — ED Notes (Signed)
Pt discharged home. Discharge information discussed. No s/s of distress observed during discharge.

## 2022-05-27 ENCOUNTER — Telehealth: Payer: Self-pay | Admitting: General Practice

## 2022-05-27 NOTE — Transitions of Care (Post Inpatient/ED Visit) (Signed)
   05/27/2022  Name: Mary Cruz MRN: [id number] DOB: October 17, 1975  Today's TOC FU Call Status: Today's TOC FU Call Status:: Unsuccessul Call (1st Attempt) Unsuccessful Call (1st Attempt) Date: 05/27/22  Attempted to reach the patient regarding the most recent Inpatient/ED visit.  Follow Up Plan: Additional outreach attempts will be made to reach the patient to complete the Transitions of Care (Post Inpatient/ED visit) call.   Signature Modesto Charon, RN BSN

## 2022-05-28 NOTE — Transitions of Care (Post Inpatient/ED Visit) (Signed)
   05/28/2022  Name: Mary Cruz MRN: [id number] DOB: 1975/10/16  Today's TOC FU Call Status: Today's TOC FU Call Status:: Unsuccessful Call (2nd Attempt) Unsuccessful Call (1st Attempt) Date: 05/27/22 Unsuccessful Call (2nd Attempt) Date: 05/28/22  Attempted to reach the patient regarding the most recent Inpatient/ED visit.  Follow Up Plan: Additional outreach attempts will be made to reach the patient to complete the Transitions of Care (Post Inpatient/ED visit) call.   Signature Modesto Charon, RN BSN

## 2022-05-29 NOTE — Transitions of Care (Post Inpatient/ED Visit) (Signed)
   05/29/2022  Name: Mary Cruz MRN: [id number] DOB: 03-Nov-1975  Today's TOC FU Call Status: Today's TOC FU Call Status:: Unsuccessful Call (3rd Attempt) Unsuccessful Call (1st Attempt) Date: 05/27/22 Unsuccessful Call (2nd Attempt) Date: 05/28/22 Unsuccessful Call (3rd Attempt) Date: 05/29/22  Attempted to reach the patient regarding the most recent Inpatient/ED visit.  Follow Up Plan: No further outreach attempts will be made at this time. We have been unable to contact the patient.  Signature Modesto Charon, RN BSN

## 2022-05-30 ENCOUNTER — Ambulatory Visit: Payer: BC Managed Care – PPO | Admitting: Medical-Surgical

## 2022-05-30 ENCOUNTER — Encounter: Payer: Self-pay | Admitting: Medical-Surgical

## 2022-05-30 VITALS — BP 119/78 | HR 91 | Resp 20 | Ht 64.0 in | Wt 274.5 lb

## 2022-05-30 DIAGNOSIS — J208 Acute bronchitis due to other specified organisms: Secondary | ICD-10-CM

## 2022-05-30 DIAGNOSIS — Z09 Encounter for follow-up examination after completed treatment for conditions other than malignant neoplasm: Secondary | ICD-10-CM | POA: Diagnosis not present

## 2022-05-30 MED ORDER — BENZONATATE 200 MG PO CAPS: 200.0000 mg | ORAL_CAPSULE | Freq: Three times a day (TID) | ORAL | 0 refills | Status: DC | PRN

## 2022-05-30 MED ORDER — FULL KIT NEBULIZER SET MISC: 0 refills | Status: AC

## 2022-05-30 MED ORDER — BUDESONIDE 0.25 MG/2ML IN SUSP: 0.2500 mg | Freq: Two times a day (BID) | RESPIRATORY_TRACT | 0 refills | Status: DC

## 2022-05-30 MED ORDER — PROMETHAZINE-DM 6.25-15 MG/5ML PO SYRP: 5.0000 mL | ORAL_SOLUTION | Freq: Four times a day (QID) | ORAL | 0 refills | Status: DC | PRN

## 2022-05-30 MED ORDER — ALBUTEROL SULFATE (2.5 MG/3ML) 0.083% IN NEBU: 2.5000 mg | INHALATION_SOLUTION | Freq: Four times a day (QID) | RESPIRATORY_TRACT | 1 refills | Status: DC | PRN

## 2022-05-30 NOTE — Progress Notes (Signed)
        Established patient visit  History, exam, impression, and plan:  1. Hospital discharge follow-up 2. Acute viral bronchitis Pleasant 47 year old female presenting today for hospital follow-up after being evaluated on 05/25/2022 at [facility] for acute hypoxia and bronchitis.  She was kept overnight and discharged on 05/26/2022.  While she was there, she was fully evaluated and found to be positive for rhinovirus/enterovirus.  Her D-dimer was elevated however there follow-up the CTA was negative.  Originally hypoxic and placed on 2 L nasal cannula however was able to wean off this with oxygen saturations remaining in the 90s.  Was sent home with an albuterol inhaler as well as steroids.  Today, she reports that she is doing some better although she is dyspneic on exertion.  Continues to have a very forceful cough and is having trouble sleeping at night.  On exam, her oxygen level is 100% on room air which is very reassuring.  Lung fields clear to auscultation throughout however does have intermittent wheezing.  Using albuterol inhaler which seems to help a little but does not seem that it gets down in her chest far enough.  Having some chest tightness periodically.  Reviewed recommendations for management of viral bronchitis and expectations for resolution of lingering symptoms.  Adding albuterol nebulizers every 6 hours as needed to evaluate for better effectiveness.  Advised her to do these regularly over the next few days and then taper once symptoms start to improve.  Printed prescription for a nebulizer machine provided to patient to take to her local medical supply store.  Also adding Pulmicort nebulizers twice daily over the next week or 2.  Okay to discontinue these once symptoms begin to resolve.  Adding Promethazine DM and Tessalon Perles for cough management.  Procedures performed this visit: None.  Return if symptoms worsen or fail to  improve.  __________________________________ Thayer Ohm, DNP, APRN, FNP-BC Primary Care and Sports Medicine [facility] [place]

## 2022-06-13 ENCOUNTER — Ambulatory Visit (INDEPENDENT_AMBULATORY_CARE_PROVIDER_SITE_OTHER): Payer: BC Managed Care – PPO | Admitting: Neurology

## 2022-06-13 ENCOUNTER — Encounter: Payer: Self-pay | Admitting: Neurology

## 2022-06-13 VITALS — BP 143/77 | HR 99 | Ht 64.0 in | Wt 272.6 lb

## 2022-06-13 DIAGNOSIS — G8929 Other chronic pain: Secondary | ICD-10-CM

## 2022-06-13 DIAGNOSIS — R55 Syncope and collapse: Secondary | ICD-10-CM | POA: Insufficient documentation

## 2022-06-13 DIAGNOSIS — N3010 Interstitial cystitis (chronic) without hematuria: Secondary | ICD-10-CM

## 2022-06-13 DIAGNOSIS — R351 Nocturia: Secondary | ICD-10-CM

## 2022-06-13 DIAGNOSIS — G4701 Insomnia due to medical condition: Secondary | ICD-10-CM

## 2022-06-13 DIAGNOSIS — R4020 Unspecified coma: Secondary | ICD-10-CM

## 2022-06-13 DIAGNOSIS — Z9689 Presence of other specified functional implants: Secondary | ICD-10-CM

## 2022-06-13 DIAGNOSIS — R053 Chronic cough: Secondary | ICD-10-CM

## 2022-06-13 DIAGNOSIS — Z9189 Other specified personal risk factors, not elsewhere classified: Secondary | ICD-10-CM

## 2022-06-13 HISTORY — DX: Other specified personal risk factors, not elsewhere classified: Z91.89

## 2022-06-13 HISTORY — DX: Chronic cough: R05.3

## 2022-06-13 HISTORY — DX: Unspecified coma: R40.20

## 2022-06-13 HISTORY — DX: Other chronic pain: G89.29

## 2022-06-13 HISTORY — DX: Nocturia: R35.1

## 2022-06-13 NOTE — Patient Instructions (Addendum)
She has chronic bronchitis but also uses inhalers, cough is productive she used to vape until very recently.   Opiate medication is  daily intake routine and can certainly foster central apnea and hypoxia, and her  obesity , large neck and small mouth are risk factors for OSA - her husband has witnessed these    I would very much prefer for this patient to undergo an in- lab sleep study,  attended by a registered polysomnography technologist.   #1 My goal is to identify degree and type sleep apnea and to treat if possible in the same night .  #2 I want to screen for hypoxia and have the opportunity to add oxygen if needed. Chronic bronchitis is present, coughing is present.    #3 this patient is also at risk of central or complex sleep apnea due to the opiate pain medication regimen which can cause central apnea.    And in that case she would likely not be served by a simple CPAP but would need a BiPAP or ASV therapy.   Given the differential I would apply and a pO2 request ratio to cover an in lab sleep study probably for June or July of this year.   Living With Sleep Apnea Sleep apnea is a condition in which breathing pauses or becomes shallow during sleep. Sleep apnea is most commonly caused by a collapsed or blocked airway. People with sleep apnea usually snore loudly. They may have times when they gasp and stop breathing for 10 seconds or more during sleep. This may happen many times during the night. The breaks in breathing also interrupt the deep sleep that you need to feel rested. Even if you do not completely wake up from the gaps in breathing, your sleep may not be restful and you feel tired during the day. You may also have a headache in the morning and low energy during the day, and you may feel anxious or depressed. How can sleep apnea affect me? Sleep apnea increases your chances of extreme tiredness during the day (daytime fatigue). It can also increase your risk for health  conditions, such as: Heart attack. Stroke. Obesity. Type 2 diabetes. Heart failure. Irregular heartbeat. High blood pressure. If you have daytime fatigue as a result of sleep apnea, you may be more likely to: Perform poorly at school or work. Fall asleep while driving. Have difficulty with attention. Develop depression or anxiety. Have sexual dysfunction. What actions can I take to manage sleep apnea? Sleep apnea treatment  If you were given a device to open your airway while you sleep, use it only as told by your health care provider. You may be given: An oral appliance. This is a custom-made mouthpiece that shifts your lower jaw forward. A continuous positive airway pressure (CPAP) device. This device blows air through a mask when you breathe out (exhale). A nasal expiratory positive airway pressure (EPAP) device. This device has valves that you put into each nostril. A bi-level positive airway pressure (BIPAP) device. This device blows air through a mask when you breathe in (inhale) and breathe out (exhale). You may need surgery if other treatments do not work for you. Sleep habits Go to sleep and wake up at the same time every day. This helps set your internal clock (circadian rhythm) for sleeping. If you stay up later than usual, such as on weekends, try to get up in the morning within 2 hours of your normal wake time. Try to get at least  7-9 hours of sleep each night. Stop using a computer, tablet, and mobile phone a few hours before bedtime. Do not take long naps during the day. If you nap, limit it to 30 minutes. Have a relaxing bedtime routine. Reading or listening to music may relax you and help you sleep. Use your bedroom only for sleep. Keep your television and computer out of your bedroom. Keep your bedroom cool, dark, and quiet. Use a supportive mattress and pillows. Follow your health care provider's instructions for other changes to sleep habits. Nutrition Do not  eat heavy meals in the evening. Do not have caffeine in the later part of the day. The effects of caffeine can last for more than 5 hours. Follow your health care provider's or dietitian's instructions for any diet changes. Lifestyle     Do not drink alcohol before bedtime. Alcohol can cause you to fall asleep at first, but then it can cause you to wake up in the middle of the night and have trouble getting back to sleep. Do not use any products that contain nicotine or tobacco. These products include cigarettes, chewing tobacco, and vaping devices, such as e-cigarettes. If you need help quitting, ask your health care provider. Medicines Take over-the-counter and prescription medicines only as told by your health care provider. Do not use over-the-counter sleep medicine. You can become dependent on this medicine, and it can make sleep apnea worse. Do not use medicines, such as sedatives and narcotics, unless told by your health care provider. Activity Exercise on most days, but avoid exercising in the evening. Exercising near bedtime can interfere with sleeping. If possible, spend time outside every day. Natural light helps regulate your circadian rhythm. General information Lose weight if you need to, and maintain a healthy weight. Keep all follow-up visits. This is important. If you are having surgery, make sure to tell your health care provider that you have sleep apnea. You may need to bring your device with you. Where to find more information Learn more about sleep apnea and daytime fatigue from: American Sleep Association: sleepassociation.org National Sleep Foundation: sleepfoundation.org National Heart, Lung, and Blood Institute: [employer].gl Summary Sleep apnea is a condition in which breathing pauses or becomes shallow during sleep. Sleep apnea can cause daytime fatigue and other serious health conditions. You may need to wear a device while sleeping to help keep your airway  open. If you are having surgery, make sure to tell your health care provider that you have sleep apnea. You may need to bring your device with you. Making changes to sleep habits, diet, lifestyle, and activity can help you manage sleep apnea. This information is not intended to replace advice given to you by your health care provider. Make sure you discuss any questions you have with your health care provider. Document Revised: 09/06/2020 Document Reviewed: 01/07/2020 Elsevier Patient Education  2023 [employer] Screening for Sleep Apnea  Sleep apnea is a condition in which breathing pauses or becomes shallow during sleep. Sleep apnea screening is a test to determine if you are at risk for sleep apnea. The test includes a series of questions. It will only takes a few minutes. Your health care provider may ask you to have this test in preparation for surgery or as part of a physical exam. What are the symptoms of sleep apnea? Common symptoms of sleep apnea include: Snoring. Waking up often at night. Daytime sleepiness. Pauses in breathing. Choking or gasping during sleep. Irritability. Forgetfulness. Trouble thinking clearly. Depression.  Personality changes. Most people with sleep apnea do not know that they have it. What are the advantages of sleep apnea screening? Getting screened for sleep apnea can help: Ensure your safety. It is important for your health care providers to know whether or not you have sleep apnea, especially if you are having surgery or have other long-term (chronic) health conditions. Improve your health and allow you to get a better night's rest. Restful sleep can help you: Have more energy. Lose weight. Improve high blood pressure. Improve diabetes management. Prevent stroke. Prevent car accidents. What happens during the screening? Screening usually includes being asked a list of questions about your sleep quality. Some questions you may be asked  include: Do you snore? Is your sleep restless? Do you have daytime sleepiness? Has a partner or spouse told you that you stop breathing during sleep? Have you had trouble concentrating or memory loss? What is your age? What is your neck circumference? To measure your neck, keep your back straight and gently wrap the tape measure around your neck. Put the tape measure at the middle of your neck, between your chin and collarbone. What is your sex assigned at birth? Do you have or are you being treated for high blood pressure? If your screening test is positive, you are at risk for the condition. Further testing may be needed to confirm a diagnosis of sleep apnea. Where to find more information You can find screening tools online or at your health care clinic. For more information about sleep apnea screening and healthy sleep, visit these websites: Centers for Disease Control and Prevention: [employer]Ex[employer].com.br American Sleep Apnea Association: www.sleepapnea.org Contact a health care provider if: You think that you may have sleep apnea. Summary Sleep apnea screening can help determine if you are at risk for sleep apnea. It is important for your health care providers to know whether or not you have sleep apnea, especially if you are having surgery or have other chronic health conditions. You may be asked to take a screening test for sleep apnea in preparation for surgery or as part of a physical exam. This information is not intended to replace advice given to you by your health care provider. Make sure you discuss any questions you have with your health care provider. Document Revised: 01/07/2020 Document Reviewed: 01/07/2020 Elsevier Patient Education  2023 [employer].
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
## 2022-06-20 ENCOUNTER — Encounter: Payer: BC Managed Care – PPO | Admitting: Medical-Surgical

## 2022-06-20 NOTE — Progress Notes (Signed)
Erroneous encounter. Please disregard.

## 2022-06-23 ENCOUNTER — Encounter: Payer: Self-pay | Admitting: Medical-Surgical

## 2022-06-26 ENCOUNTER — Telehealth: Payer: Self-pay | Admitting: Neurology

## 2022-06-26 NOTE — Telephone Encounter (Signed)
5/15:lvm-mla 06/21/22 [place] Numbers: [id number] (exp. 06/21/22 to 08/19/22) EE

## 2022-06-27 ENCOUNTER — Telehealth (INDEPENDENT_AMBULATORY_CARE_PROVIDER_SITE_OTHER): Payer: BC Managed Care – PPO | Admitting: Medical-Surgical

## 2022-06-27 ENCOUNTER — Encounter: Payer: Self-pay | Admitting: Medical-Surgical

## 2022-06-27 DIAGNOSIS — F419 Anxiety disorder, unspecified: Secondary | ICD-10-CM

## 2022-06-27 DIAGNOSIS — N951 Menopausal and female climacteric states: Secondary | ICD-10-CM | POA: Diagnosis not present

## 2022-06-27 DIAGNOSIS — F32A Depression, unspecified: Secondary | ICD-10-CM

## 2022-06-27 MED ORDER — VEOZAH 45 MG PO TABS: 45.0000 mg | ORAL_TABLET | Freq: Every day | ORAL | 1 refills | Status: DC

## 2022-06-27 MED ORDER — LEVOCETIRIZINE DIHYDROCHLORIDE 5 MG PO TABS: 5.0000 mg | ORAL_TABLET | Freq: Every evening | ORAL | 3 refills | Status: DC

## 2022-06-27 NOTE — Progress Notes (Signed)
Virtual Visit via Video Note  I connected with Mary Cruz on 06/27/22 at 10:30 AM EDT by a video enabled telemedicine application and verified that I am speaking with the correct person using two identifiers.   I discussed the limitations of evaluation and management by telemedicine and the availability of in person appointments. The patient expressed understanding and agreed to proceed.  Patient location: home Provider locations: office  Subjective:    CC: mood follow up   HPI: Pleasant 47 year old female presenting via MyChart video visit for the following:  Mood: Taking duloxetine 60 mg twice daily, tolerating well without side effects.  Approximately 4-6 weeks ago, we started BuSpar 5-15 mg twice daily.  She has been taking 10 mg 2 times daily and reports that the medication is working very well for her in conjunction with duloxetine.  Feels that her mood is stable and her anxiety is well-controlled.  Denies SI/HI.  Possible vasomotor symptoms: Started Veozah 45 mg daily approximately 6 weeks ago.  Has been tolerating the medication well without side effects.  Reports that her hot flashes and night sweats have almost completely disappeared and she is liking the effects of the medication.  Past medical history, Surgical history, Family history not pertinant except as noted below, Social history, Allergies, and medications have been entered into the medical record, reviewed, and corrections made.   Review of Systems: See HPI for pertinent positives and negatives.   Objective:    General: Speaking clearly in complete sentences without any shortness of breath.  Alert and oriented x3.  Normal judgment. No apparent acute distress.  Impression and Recommendations:    1. Anxiety and depression Symptoms currently well-managed.  Continue duloxetine and BuSpar as prescribed.  2. Perimenopausal vasomotor symptoms She has had great response to [facility] and is happy with the medication.   Continue Veozah 45 mg daily as prescribed.   I discussed the assessment and treatment plan with the patient. The patient was provided an opportunity to ask questions and all were answered. The patient agreed with the plan and demonstrated an understanding of the instructions.   The patient was advised to call back or seek an in-person evaluation if the symptoms worsen or if the condition fails to improve as anticipated.  25 minutes of non-face-to-face time was provided during this encounter.  Return in about 6 months (around 12/28/2022) for chronic disease follow up.  Thayer Ohm, DNP, APRN, FNP-BC [facility]

## 2022-06-29 ENCOUNTER — Telehealth: Payer: Self-pay

## 2022-06-29 NOTE — Telephone Encounter (Addendum)
Initiated Prior authorization BJY:NWGNFA 45MG  tablets Via: Covermymeds Case/Key:[id number] Status: approved as of 06/29/22 Reason:Authorization Expiration Date: 06/29/2023  Notified Pt via: Mychart   Initiated Prior authorization OZH:[id number] Dihydrochloride 5MG  tablets Via: Covermymeds Case/Key:[id number] Status: cancelled as of 06/29/22 Reason:Canceled. Please note that levocetirizine tablet does not require prior authorization review Notified Pt via: Mychart   Initiated Prior authorization WUX:[id number] HCl 60MG  dr capsules Via: Covermymeds Case/Key:[id number] Status: cancelled  as of 06/29/22 Reason: Please note, this medication does not require review, paid claim on 06/24/22,  Notified Pt via: Mychart

## 2022-07-02 NOTE — Telephone Encounter (Signed)
Split- [employer] [name]: [id number] (exp. 06/21/22 to 08/19/22)   Patient is scheduled at [facility] for 07/14/22 at 9 pm.  Mailed packet to the patient.

## 2022-07-03 ENCOUNTER — Telehealth: Payer: Self-pay | Admitting: Neurology

## 2022-07-03 NOTE — Telephone Encounter (Signed)
Pt stated she needs her prescription change from SUMAtriptan (IMITREX) 6 MG/0.5ML SOLN injection. Stated the pharmacy is asking for a prescription for IMITREX PEN.

## 2022-07-04 ENCOUNTER — Other Ambulatory Visit: Payer: Self-pay | Admitting: Neurology

## 2022-07-04 DIAGNOSIS — G43909 Migraine, unspecified, not intractable, without status migrainosus: Secondary | ICD-10-CM

## 2022-07-04 MED ORDER — SUMATRIPTAN SUCCINATE 6 MG/0.5ML ~~LOC~~ SOAJ: 6.0000 mg | SUBCUTANEOUS | 11 refills | Status: DC | PRN

## 2022-07-14 ENCOUNTER — Ambulatory Visit (INDEPENDENT_AMBULATORY_CARE_PROVIDER_SITE_OTHER): Payer: BC Managed Care – PPO | Admitting: Neurology

## 2022-07-14 DIAGNOSIS — G8929 Other chronic pain: Secondary | ICD-10-CM

## 2022-07-14 DIAGNOSIS — G4733 Obstructive sleep apnea (adult) (pediatric): Secondary | ICD-10-CM | POA: Diagnosis not present

## 2022-07-14 DIAGNOSIS — R053 Chronic cough: Secondary | ICD-10-CM

## 2022-07-14 DIAGNOSIS — Z9689 Presence of other specified functional implants: Secondary | ICD-10-CM

## 2022-07-14 DIAGNOSIS — Z9189 Other specified personal risk factors, not elsewhere classified: Secondary | ICD-10-CM

## 2022-07-14 DIAGNOSIS — N3010 Interstitial cystitis (chronic) without hematuria: Secondary | ICD-10-CM

## 2022-07-15 ENCOUNTER — Encounter: Payer: Self-pay | Admitting: Pain Medicine

## 2022-07-15 ENCOUNTER — Ambulatory Visit: Payer: BC Managed Care – PPO | Attending: Pain Medicine | Admitting: Pain Medicine

## 2022-07-15 DIAGNOSIS — G8929 Other chronic pain: Secondary | ICD-10-CM | POA: Diagnosis not present

## 2022-07-15 DIAGNOSIS — Z79891 Long term (current) use of opiate analgesic: Secondary | ICD-10-CM | POA: Insufficient documentation

## 2022-07-15 DIAGNOSIS — Z79899 Other long term (current) drug therapy: Secondary | ICD-10-CM | POA: Diagnosis not present

## 2022-07-15 DIAGNOSIS — M5459 Other low back pain: Secondary | ICD-10-CM | POA: Insufficient documentation

## 2022-07-15 DIAGNOSIS — M25552 Pain in left hip: Secondary | ICD-10-CM | POA: Insufficient documentation

## 2022-07-15 DIAGNOSIS — M79602 Pain in left arm: Secondary | ICD-10-CM | POA: Insufficient documentation

## 2022-07-15 DIAGNOSIS — M542 Cervicalgia: Secondary | ICD-10-CM | POA: Insufficient documentation

## 2022-07-15 DIAGNOSIS — M25551 Pain in right hip: Secondary | ICD-10-CM | POA: Diagnosis not present

## 2022-07-15 DIAGNOSIS — M47816 Spondylosis without myelopathy or radiculopathy, lumbar region: Secondary | ICD-10-CM | POA: Insufficient documentation

## 2022-07-15 DIAGNOSIS — M79601 Pain in right arm: Secondary | ICD-10-CM | POA: Insufficient documentation

## 2022-07-15 DIAGNOSIS — G894 Chronic pain syndrome: Secondary | ICD-10-CM | POA: Insufficient documentation

## 2022-07-15 MED ORDER — OXYCODONE HCL 5 MG PO TABS
5.0000 mg | ORAL_TABLET | Freq: Four times a day (QID) | ORAL | 0 refills | Status: DC | PRN
Start: 2022-08-09 — End: 2022-08-19

## 2022-07-15 MED ORDER — OXYCODONE HCL 5 MG PO TABS
5.0000 mg | ORAL_TABLET | Freq: Four times a day (QID) | ORAL | 0 refills | Status: DC | PRN
Start: 2022-10-08 — End: 2022-10-13

## 2022-07-15 MED ORDER — OXYCODONE HCL 5 MG PO TABS
5.00 mg | ORAL_TABLET | Freq: Four times a day (QID) | ORAL | 0 refills | Status: DC | PRN
Start: 2022-09-08 — End: 2022-08-19

## 2022-07-15 NOTE — Patient Instructions (Addendum)
Drug Holidays  Definitions Tolerance:  Defined as the progressively decreased responsiveness to a drug.  Occurs when the drug is used repeatedly and the body adapts to the continued presence of the drug.  As a result, a larger dose of the drug is needed to achieve the effect originally obtained by a smaller dose.  It is thought to be due to the formation of excess opioid receptors.  Drug Holiday:   Is when a patient stops taking a medication(s) for a period of time; anywhere from a few days to several weeks.  Withdrawals:   Refers to the wide range of symptoms that occur after stopping or dramatically reducing opiate drugs after heavy or prolonged use.  Withdrawal symptoms do not occur to patients that use low dose opioids, or those who take the medication sporadically.  Contrary to benzodiazepine (example: Valium, Xanax, etc.) or alcohol withdrawals ("Delirium Tremens"), opioid withdrawals are not lethal.  Withdrawals are the physical manifestation of the body getting rid of the excess receptors.  Purpose To eliminate tolerance.  Duration of Holiday 14 consecutive days. (2 weeks)  Expected Symptoms Early symptoms of withdrawal include:  Agitation Anxiety Muscle Aches  Increased tearing Insomnia Runny Nose  Sweating Yawning     Late symptoms of withdrawal include:  Abdominal cramping Diarrhea Dilated pupils  Goose bumps Nausea Vomiting   Opioid withdrawal reactions are very uncomfortable but are not life-threatening.  Symptoms usually start within 12 hours of last opioid dose and within 30 hours of last methadone exposure.  Duration of Symptoms 48-72 hours of short acting medications and 2 to 4 days for methadone.  Treatment Clonidine (CatepresT) or tizanidine (ZanaflexT) for agitation, sweating, tearing, runny nose. Promethazine (PhenerganT) for nausea, vomiting. NSAIDs for pain.  Benefits Improved effectiveness of opioids. Decreased opioid dose needed to achieve  benefits. Improved pain with lesser dose. ____________________________________________________________________________________________  Opioid Pain Medication Update  To: All patients taking opioid pain medications. (I.e.: hydrocodone, hydromorphone, oxycodone, oxymorphone, morphine, codeine, methadone, tapentadol, tramadol, buprenorphine, fentanyl, etc.)  Re: Updated review of side effects and adverse reactions of opioid analgesics, as well as new information about long term effects of this class of medications.  Direct risks of long-term opioid therapy are not limited to opioid addiction and overdose. Potential medical risks include serious fractures, breathing problems during sleep, hyperalgesia, immunosuppression, chronic constipation, bowel obstruction, myocardial infarction, and tooth decay secondary to xerostomia.  Unpredictable adverse effects that can occur even if you take your medication correctly: Cognitive impairment, respiratory depression, and death. Most people think that if they take their medication "correctly", and "as instructed", that they will be safe. Nothing could be farther from the truth. In reality, a significant amount of recorded deaths associated with the use of opioids has occurred in individuals that had taken the medication for a long time, and were taking their medication correctly. The following are examples of how this can happen: Patient taking his/her medication for a long time, as instructed, without any side effects, is given a certain antibiotic or another unrelated medication, which in turn triggers a "Drug-to-drug interaction" leading to disorientation, cognitive impairment, impaired reflexes, respiratory depression or an untoward event leading to serious bodily harm or injury, including death.  Patient taking his/her medication for a long time, as instructed, without any side effects, develops an acute impairment of liver and/or kidney function. This will  lead to a rapid inability of the body to breakdown and eliminate their pain medication, which will result in effects similar to an "  overdose", but with the same medicine and dose that they had always taken. This again may lead to disorientation, cognitive impairment, impaired reflexes, respiratory depression or an untoward event leading to serious bodily harm or injury, including death.  A similar problem will occur with patients as they grow older and their liver and kidney function begins to decrease as part of the aging process.  Background information: Historically, the original case for using long-term opioid therapy to treat chronic noncancer pain was based on safety assumptions that subsequent experience has called into question. In 1996, the American Pain Society and the American Academy of Pain Medicine issued a consensus statement supporting long-term opioid therapy. This statement acknowledged the dangers of opioid prescribing but concluded that the risk for addiction was low; respiratory depression induced by opioids was short-lived, occurred mainly in opioid-naive patients, and was antagonized by pain; tolerance was not a common problem; and efforts to control diversion should not constrain opioid prescribing. This has now proven to be wrong. Experience regarding the risks for opioid addiction, misuse, and overdose in community practice has failed to support these assumptions.  According to the Centers for Disease Control and Prevention, fatal overdoses involving opioid analgesics have increased sharply over the past decade. Currently, more than 96,700 people die from drug overdoses every year. Opioids are a factor in 7 out of every 10 overdose deaths. Deaths from drug overdose have surpassed motor vehicle accidents as the leading cause of death for individuals between the ages of 66 and 22.  Clinical data suggest that neuroendocrine dysfunction may be very common in both men and women,  potentially causing hypogonadism, erectile dysfunction, infertility, decreased libido, osteoporosis, and depression. Recent studies linked higher opioid dose to increased opioid-related mortality. Controlled observational studies reported that long-term opioid therapy may be associated with increased risk for cardiovascular events. Subsequent meta-analysis concluded that the safety of long-term opioid therapy in elderly patients has not been proven.   Side Effects and adverse reactions: Common side effects: Drowsiness (sedation). Dizziness. Nausea and vomiting. Constipation. Physical dependence -- Dependence often manifests with withdrawal symptoms when opioids are discontinued or decreased. Tolerance -- As you take repeated doses of opioids, you require increased medication to experience the same effect of pain relief. Respiratory depression -- This can occur in healthy people, especially with higher doses. However, people with COPD, asthma or other lung conditions may be even more susceptible to fatal respiratory impairment.  Uncommon side effects: An increased sensitivity to feeling pain and extreme response to pain (hyperalgesia). Chronic use of opioids can lead to this. Delayed gastric emptying (the process by which the contents of your stomach are moved into your small intestine). Muscle rigidity. Immune system and hormonal dysfunction. Quick, involuntary muscle jerks (myoclonus). Arrhythmia. Itchy skin (pruritus). Dry mouth (xerostomia).  Long-term side effects: Chronic constipation. Sleep-disordered breathing (SDB). Increased risk of bone fractures. Hypothalamic-pituitary-adrenal dysregulation. Increased risk of overdose.  RISKS: Fractures and Falls:  Opioids increase the risk and incidence of falls. This is of particular importance in elderly patients.  Endocrine System:  Long-term administration is associated with endocrine abnormalities (endocrinopathies). (Also known as  Opioid-induced Endocrinopathy) Influences on both the hypothalamic-pituitary-adrenal axis?and the hypothalamic-pituitary-gonadal axis have been demonstrated with consequent hypogonadism and adrenal insufficiency in both sexes. Hypogonadism and decreased levels of dehydroepiandrosterone sulfate have been reported in men and women. Endocrine effects include: Amenorrhoea in women (abnormal absence of menstruation) Reduced libido in both sexes Decreased sexual function Erectile dysfunction in men Hypogonadisms (decreased testicular function with shrinkage  of testicles) Infertility Depression and fatigue Loss of muscle mass Anxiety Depression Immune suppression Hyperalgesia Weight gain Anemia Osteoporosis Patients (particularly women of childbearing age) should avoid opioids. There is insufficient evidence to recommend routine monitoring of asymptomatic patients taking opioids in the long-term for hormonal deficiencies.  Immune System: Human studies have demonstrated that opioids have an immunomodulating effect. These effects are mediated via opioid receptors both on immune effector cells and in the central nervous system. Opioids have been demonstrated to have adverse effects on antimicrobial response and anti-tumour surveillance. Buprenorphine has been demonstrated to have no impact on immune function.  Opioid Induced Hyperalgesia: Human studies have demonstrated that prolonged use of opioids can lead to a state of abnormal pain sensitivity, sometimes called opioid induced hyperalgesia (OIH). Opioid induced hyperalgesia is not usually seen in the absence of tolerance to opioid analgesia. Clinically, hyperalgesia may be diagnosed if the patient on long-term opioid therapy presents with increased pain. This might be qualitatively and anatomically distinct from pain related to disease progression or to breakthrough pain resulting from development of opioid tolerance. Pain associated with  hyperalgesia tends to be more diffuse than the pre-existing pain and less defined in quality. Management of opioid induced hyperalgesia requires opioid dose reduction.  Cancer: Chronic opioid therapy has been associated with an increased risk of cancer among noncancer patients with chronic pain. This association was more evident in chronic strong opioid users. Chronic opioid consumption causes significant pathological changes in the small intestine and colon. Epidemiological studies have found that there is a link between opium dependence and initiation of gastrointestinal cancers. Cancer is the second leading cause of death after cardiovascular disease. Chronic use of opioids can cause multiple conditions such as GERD, immunosuppression and renal damage as well as carcinogenic effects, which are associated with the incidence of cancers.   Mortality: Long-term opioid use has been associated with increased mortality among patients with chronic non-cancer pain (CNCP).  Prescription of long-acting opioids for chronic noncancer pain was associated with a significantly increased risk of all-cause mortality, including deaths from causes other than overdose.  Reference: Von Korff M, Kolodny A, Deyo RA, Chou R. Long-term opioid therapy reconsidered. Ann Intern Med. 2011 Sep 6;155(5):325-8. doi: 10.7326/[id number]. PMID: 16109604; PMCID: [id number]. Randon Goldsmith, Hayward RA, Dunn KM, [place] KP. Risk of adverse events in patients prescribed long-term opioids: A cohort study in the [place] [facility] Research Datalink. Eur J Pain. 2019 May;23(5):908-922. doi: 10.1002/ejp.1357. Epub 2019 Jan 31. PMID: 78295621. Colameco S, Coren JS, Ci[place]o CA. Continuous opioid treatment for chronic noncancer pain: a time for moderation in prescribing. Postgrad Med. 2009 Jul;121(4):61-6. doi: 10.3810/pgm.2009.07.2032. PMID: 30865784. William Hamburger RN, [place],  Blazina I, Cristopher Peru, Bougatsos C, Deyo RA. The effectiveness and risks of long-term opioid therapy for chronic pain: a systematic review for a [employer] of [employer] Pathways to [employer]. Ann Intern Med. 2015 Feb 17;162(4):276-86. doi: 10.7326/M14-2559. PMID: 69629528. Caryl Bis [facility], [place] DM. NCHS Data Brief No. 22. [place]: Centers for Disease Control and Prevention; 2009. Sep, Increase in Fatal Poisonings Involving Opioid Analgesics in the [place], 1999-2006. Song IA, Choi HR, Oh TK. Long-term opioid use and mortality in patients with chronic non-cancer pain: Ten-year follow-up study in [place] from 2010 through 2019. EClinicalMedicine. 2022 Jul 18;51:101558. doi: 10.1016/j.eclinm.2022.413244. PMID: 01027253; PMCID: [id number]. Huser, W., Schubert, T., Vogelmann, T. et al. All-cause mortality in patients with long-term opioid therapy compared with non-opioid analgesics for chronic non-cancer  pain: a database study. BMC Med 18, 162 (2020). http://lester.info/ Rashidian H, Karie Kirks, Malekzadeh R, Haghdoost AA. An Ecological Study of the Association between Opiate Use and Incidence of Cancers. Addict Health. 2016 Fall;8(4):252-260. PMID: 16109604; PMCID: [id number].  Our Goal: Our goal is to control your pain with means other than the use of opioid pain medications.  Our Recommendation: Talk to your physician about coming off of these medications. We can assist you with the tapering down and stopping these medicines. Based on the new information, even if you cannot completely stop the medication, a decrease in the dose may be associated with a lesser risk. Ask for other means of controlling the pain. Decrease or eliminate those factors that significantly contribute to your pain such as smoking, obesity, and a diet heavily tilted towards "inflammatory" nutrients.  Last Updated: 04/10/2022    ____________________________________________________________________________________________     ____________________________________________________________________________________________  National Pain Medication Shortage  The [place] is experiencing worsening drug shortages. These have had a negative widespread effect on patient care and treatment. Not expected to improve any time soon. Predicted to last past 2029.   Drug shortage list (generic names) Oxycodone IR Oxycodone/APAP Oxymorphone IR Hydromorphone Hydrocodone/APAP Morphine  Where is the problem?  Manufacturing and supply level.  Will this shortage affect you?  Only if you take any of the above pain medications.  How? You may be unable to fill your prescription.  Your pharmacist may offer a "partial fill" of your prescription. (Warning: Do not accept partial fills.) Prescriptions partially filled cannot be transferred to another pharmacy. Read our Medication Rules and Regulation. Depending on how much medicine you are dependent on, you may experience withdrawals when unable to get the medication.  Recommendations: Consider ending your dependence on opioid pain medications. Ask your pain specialist to assist you with the process. Consider switching to a medication currently not in shortage, such as Buprenorphine. Talk to your pain specialist about this option. Consider decreasing your pain medication requirements by managing tolerance thru "Drug Holidays". This may help minimize withdrawals, should you run out of medicine. Control your pain thru the use of non-pharmacological interventional therapies.   Your prescriber: Prescribers cannot be blamed for shortages. Medication manufacturing and supply issues cannot be fixed by the prescriber.   NOTE: The prescriber is not responsible for supplying the medication, or solving supply issues. Work with your pharmacist to solve it. The patient is responsible for the decision  to take or continue taking the medication and for identifying and securing a legal supply source. By law, supplying the medication is the job and responsibility of the pharmacy. The prescriber is responsible for the evaluation, monitoring, and prescribing of these medications.   Prescribers will NOT: Re-issue prescriptions that have been partially filled. Re-issue prescriptions already sent to a pharmacy.  Re-send prescriptions to a different pharmacy because yours did not have your medication. Ask pharmacist to order more medicine or transfer the prescription to another pharmacy. (Read below.)  New 2023 regulation: "October 12, 2021 Revised Regulation Allows DEA-Registered Pharmacies to Transfer Electronic Prescriptions at a Patient's Request DEA Headquarters Division - Public Information Office Patients now have the ability to request their electronic prescription be transferred to another pharmacy without having to go back to their practitioner to initiate the request. This revised regulation went into effect on Monday, October 08, 2021.     At a patient's request, a DEA-registered retail pharmacy can now transfer an electronic prescription for a controlled substance (schedules II-V) to  another DEA-registered Tour manager. Prior to this change, patients would have to go through their practitioner to cancel their prescription and have it re-issued to a different pharmacy. The process was taxing and time consuming for both patients and practitioners.    The Drug Enforcement Administration [facility]) published its intent to revise the process for transferring electronic prescriptions on December 31, 2019.  The final rule was published in the federal register on September 06, 2021 and went into effect 30 days later.  Under the final rule, a prescription can only be transferred once between pharmacies, and only if allowed under existing state or other applicable law. The prescription must remain in its  electronic form; may not be altered in any way; and the transfer must be communicated directly between two licensed pharmacists. It's important to note, any authorized refills transfer with the original prescription, which means the entire prescription will be filled at the same pharmacy".  Reference: [email or web address] [facility] website announcement)  [email or web address] Financial planner of Justice)   [employer] / Vol. 88, No. 143 / Thursday, September 06, 2021 / Rules and Regulations DEPARTMENT OF JUSTICE  Drug Enforcement Administration  21 CFR Part 1306  [Docket No. [id number]]  RIN [id number] Transfer of Electronic Prescriptions for Schedules II-V Controlled Substances Between Pharmacies for Initial Filling  ____________________________________________________________________________________________     ____________________________________________________________________________________________  Transfer of Pain Medication between Pharmacies  Re: 2023 DEA Clarification on existing regulation  Published on DEA Website: October 12, 2021  Title: Revised Regulation Allows DEA-Registered Pharmacies to Electrical engineer Prescriptions at a Patient's Request DEA Headquarters Division - [employer]  "Patients now have the ability to request their electronic prescription be transferred to another pharmacy without having to go back to their practitioner to initiate the request. This revised regulation went into effect on Monday, October 08, 2021.     At a patient's request, a DEA-registered retail pharmacy can now transfer an electronic prescription for a controlled substance (schedules II-V) to another DEA-registered retail pharmacy. Prior to this change, patients would have to go through their practitioner to cancel their  prescription and have it re-issued to a different pharmacy. The process was taxing and time consuming for both patients and practitioners.    The Drug Enforcement Administration [facility]) published its intent to revise the process for transferring electronic prescriptions on December 31, 2019.  The final rule was published in the federal register on September 06, 2021 and went into effect 30 days later.  Under the final rule, a prescription can only be transferred once between pharmacies, and only if allowed under existing state or other applicable law. The prescription must remain in its electronic form; may not be altered in any way; and the transfer must be communicated directly between two licensed pharmacists. It's important to note, any authorized refills transfer with the original prescription, which means the entire prescription will be filled at the same pharmacy."    REFERENCES: 1. DEA website announcement [email or web address]  2. Department of Justice website  CheapWipes.at.pdf  3. DEPARTMENT OF JUSTICE Drug Enforcement Administration 21 CFR Part 1306 [Docket No. [id number]] RIN [id number]-[id number] "Transfer of Electronic Prescriptions for Schedules II-V Controlled Substances Between Pharmacies for Initial Filling"  ____________________________________________________________________________________________     _______________________________________________________________________  Medication Rules  Purpose: To inform patients, and their family members, of our medication rules and regulations.  Applies to: All patients receiving prescriptions from our practice (written or electronic).  Pharmacy of record: This is the pharmacy where your electronic prescriptions will  be sent. Make sure we have the correct one.  Electronic prescriptions: In compliance  with the [facility] Strengthen Opioid Misuse Prevention (STOP) Act of 2017 (Session Conni Elliot [phone]), effective February 11, 2018, all controlled substances must be electronically prescribed. Written prescriptions, faxing, or calling prescriptions to a pharmacy will no longer be done.  Prescription refills: These will be provided only during in-person appointments. No medications will be renewed without a "face-to-face" evaluation with your provider. Applies to all prescriptions.  NOTE: The following applies primarily to controlled substances (Opioid* Pain Medications).   Type of encounter (visit): For patients receiving controlled substances, face-to-face visits are required. (Not an option and not up to the patient.)  Patient's responsibilities: Pain Pills: Bring all pain pills to every appointment (except for procedure appointments). Pill Bottles: Bring pills in original pharmacy bottle. Bring bottle, even if empty. Always bring the bottle of the most recent fill.  Medication refills: You are responsible for knowing and keeping track of what medications you are taking and when is it that you will need a refill. The day before your appointment: write a list of all prescriptions that need to be refilled. The day of the appointment: give the list to the admitting nurse. Prescriptions will be written only during appointments. No prescriptions will be written on procedure days. If you forget a medication: it will not be "Called in", "Faxed", or "electronically sent". You will need to get another appointment to get these prescribed. No early refills. Do not call asking to have your prescription filled early. Partial  or short prescriptions: Occasionally your pharmacy may not have enough pills to fill your prescription.  NEVER ACCEPT a partial fill or a prescription that is short of the total amount of pills that you were prescribed.  With controlled substances the law allows 72 hours for the pharmacy  to complete the prescription.  If the prescription is not completed within 72 hours, the pharmacist will require a new prescription to be written. This means that you will be short on your medicine and we WILL NOT send another prescription to complete your original prescription.  Instead, request the pharmacy to send a carrier to a nearby branch to get enough medication to provide you with your full prescription. Prescription Accuracy: You are responsible for carefully inspecting your prescriptions before leaving our office. Have the discharge nurse carefully go over each prescription with you, before taking them home. Make sure that your name is accurately spelled, that your address is correct. Check the name and dose of your medication to make sure it is accurate. Check the number of pills, and the written instructions to make sure they are clear and accurate. Make sure that you are given enough medication to last until your next medication refill appointment. Taking Medication: Take medication as prescribed. When it comes to controlled substances, taking less pills or less frequently than prescribed is permitted and encouraged. Never take more pills than instructed. Never take the medication more frequently than prescribed.  Inform other Doctors: Always inform, all of your healthcare providers, of all the medications you take. Pain Medication from other Providers: You are not allowed to accept any additional pain medication from any other Doctor or Healthcare provider. There are two exceptions to this rule. (see below) In the event that you require additional pain medication, you are responsible for notifying us, as stated below. Cough Medicine: Often these contain an opioid, such as codeine or hydrocodone. Never accept or take cough medicine containing these  opioids if you are already taking an opioid* medication. The combination may cause respiratory failure and death. Medication Agreement: You are  responsible for carefully reading and following our Medication Agreement. This must be signed before receiving any prescriptions from our practice. Safely store a copy of your signed Agreement. Violations to the Agreement will result in no further prescriptions. (Additional copies of our Medication Agreement are available upon request.) Laws, Rules, & Regulations: All patients are expected to follow all [address] and [employer], Rules, [employer]. Ignorance of the Laws does not constitute a valid excuse.  Illegal drugs and Controlled Substances: The use of illegal substances (including, but not limited to marijuana and its derivatives) and/or the illegal use of any controlled substances is strictly prohibited. Violation of this rule may result in the immediate and permanent discontinuation of any and all prescriptions being written by our practice. The use of any illegal substances is prohibited. Adopted CDC guidelines & recommendations: Target dosing levels will be at or below 60 MME/day. Use of benzodiazepines** is not recommended.  Exceptions: There are only two exceptions to the rule of not receiving pain medications from other Healthcare Providers. Exception #1 (Emergencies): In the event of an emergency (i.e.: accident requiring emergency care), you are allowed to receive additional pain medication. However, you are responsible for: As soon as you are able, call our office [phone], at any time of the day or night, and leave a message stating your name, the date and nature of the emergency, and the name and dose of the medication prescribed. In the event that your call is answered by a member of our staff, make sure to document and save the date, time, and the name of the person that took your information.  Exception #2 (Planned Surgery): In the event that you are scheduled by another doctor or dentist to have any type of surgery or procedure, you are allowed (for a period no longer  than 30 days), to receive additional pain medication, for the acute post-op pain. However, in this case, you are responsible for picking up a copy of our "Post-op Pain Management for Surgeons" handout, and giving it to your surgeon or dentist. This document is available at our office, and does not require an appointment to obtain it. Simply go to our office during business hours (Monday-Thursday from 8:00 AM to 4:00 PM) (Friday 8:00 AM to 12:00 Noon) or if you have a scheduled appointment with [place], prior to your surgery, and ask for it by name. In addition, you are responsible for: calling our office [phone], at any time of the day or night, and leaving a message stating your name, name of your surgeon, type of surgery, and date of procedure or surgery. Failure to comply with your responsibilities may result in termination of therapy involving the controlled substances. Medication Agreement Violation. Following the above rules, including your responsibilities will help you in avoiding a Medication Agreement Violation ("Breaking your Pain Medication Contract").  Consequences:  Not following the above rules may result in permanent discontinuation of medication prescription therapy.  *Opioid medications include: morphine, codeine, oxycodone, oxymorphone, hydrocodone, hydromorphone, meperidine, tramadol, tapentadol, buprenorphine, fentanyl, methadone. **Benzodiazepine medications include: diazepam (Valium), alprazolam (Xanax), clonazepam (Klonopine), lorazepam (Ativan), clorazepate (Tranxene), chlordiazepoxide (Librium), estazolam (Prosom), oxazepam (Serax), temazepam (Restoril), triazolam (Halcion) (Last updated: 12/04/2021) ______________________________________________________________________    ______________________________________________________________________  Medication Recommendations and Reminders  Applies to: All patients receiving prescriptions (written and/or  electronic).  Medication Rules & Regulations: You are responsible  for reading, knowing, and following our "Medication Rules" document. These exist for your safety and that of others. They are not flexible and neither are we. Dismissing or ignoring them is an act of "non-compliance" that may result in complete and irreversible termination of such medication therapy. For safety reasons, "non-compliance" will not be tolerated. As with the [place] fundamental legal principle of "ignorance of the law is no defense", we will accept no excuses for not having read and knowing the content of documents provided to you by our practice.  Pharmacy of record:  Definition: This is the pharmacy where your electronic prescriptions will be sent.  We do not endorse any particular pharmacy. It is up to you and your insurance to decide what pharmacy to use.  We do not restrict you in your choice of pharmacy. However, once we write for your prescriptions, we will NOT be re-sending more prescriptions to fix restricted supply problems created by your pharmacy, or your insurance.  The pharmacy listed in the electronic medical record should be the one where you want electronic prescriptions to be sent. If you choose to change pharmacy, simply notify our nursing staff. Changes will be made only during your regular appointments and not over the phone.  Recommendations: Keep all of your pain medications in a safe place, under lock and key, even if you live alone. We will NOT replace lost, stolen, or damaged medication. We do not accept "Police Reports" as proof of medications having been stolen. After you fill your prescription, take 1 week's worth of pills and put them away in a safe place. You should keep a separate, properly labeled bottle for this purpose. The remainder should be kept in the original bottle. Use this as your primary supply, until it runs out. Once it's gone, then you know that you have 1 week's worth of medicine,  and it is time to come in for a prescription refill. If you do this correctly, it is unlikely that you will ever run out of medicine. To make sure that the above recommendation works, it is very important that you make sure your medication refill appointments are scheduled at least 1 week before you run out of medicine. To do this in an effective manner, make sure that you do not leave the office without scheduling your next medication management appointment. Always ask the nursing staff to show you in your prescription , when your medication will be running out. Then arrange for the receptionist to get you a return appointment, at least 7 days before you run out of medicine. Do not wait until you have 1 or 2 pills left, to come in. This is very poor planning and does not take into consideration that we may need to cancel appointments due to bad weather, sickness, or emergencies affecting our staff. DO NOT ACCEPT A "Partial Fill": If for any reason your pharmacy does not have enough pills/tablets to completely fill or refill your prescription, do not allow for a "partial fill". The law allows the pharmacy to complete that prescription within 72 hours, without requiring a new prescription. If they do not fill the rest of your prescription within those 72 hours, you will need a separate prescription to fill the remaining amount, which we will NOT provide. If the reason for the partial fill is your insurance, you will need to talk to the pharmacist about payment alternatives for the remaining tablets, but again, DO NOT ACCEPT A PARTIAL FILL, unless you can trust your  pharmacist to obtain the remainder of the pills within 72 hours.  Prescription refills and/or changes in medication(s):  Prescription refills, and/or changes in dose or medication, will be conducted only during scheduled medication management appointments. (Applies to both, written and electronic prescriptions.) No refills on procedure days. No  medication will be changed or started on procedure days. No changes, adjustments, and/or refills will be conducted on a procedure day. Doing so will interfere with the diagnostic portion of the procedure. No phone refills. No medications will be "called into the pharmacy". No Fax refills. No weekend refills. No Holliday refills. No after hours refills.  Remember:  Business hours are:  Monday to Thursday 8:00 AM to 4:00 PM Provider's Schedule: Delano Metz, MD - Appointments are:  Medication management: Monday and Wednesday 8:00 AM to 4:00 PM Procedure day: Tuesday and Thursday 7:30 AM to 4:00 PM Edward Jolly, MD - Appointments are:  Medication management: Tuesday and Thursday 8:00 AM to 4:00 PM Procedure day: Monday and Wednesday 7:30 AM to 4:00 PM (Last update: 12/04/2021) ______________________________________________________________________    ____________________________________________________________________________________________  Drug Holidays  What is a "Drug Holiday"? Drug Holiday: is the name given to the process of slowly tapering down and temporarily stopping the pain medication for the purpose of decreasing or eliminating tolerance to the drug.  Benefits Improved effectiveness Decreased required effective dose Improved pain control End dependence on high dose therapy Decrease cost of therapy Uncovering "opioid-induced hyperalgesia". (OIH)  What is "opioid hyperalgesia"? It is a paradoxical increase in pain caused by exposure to opioids. Stopping the opioid pain medication, contrary to the expected, it actually decreases or completely eliminates the pain. Ref.: "A comprehensive review of opioid-induced hyperalgesia". Donney Rankins, et.al. Pain Physician. 2011 Mar-Apr;14(2):145-61.  What is tolerance? Tolerance: the progressive loss of effectiveness of a pain medicine due to repetitive use. A common problem of opioid pain medications.  How long should a "Drug  Holiday" last? Effectiveness depends on the patient staying off all opioid pain medicines for a minimum of 14 consecutive days. (2 weeks)  How about just taking less of the medicine? Does not work. Will not accomplish goal of eliminating the excess receptors.  How about switching to a different pain medicine? (AKA. "Opioid rotation") Does not work. Creates the illusion of effectiveness by taking advantage of inaccurate equivalent dose calculations between different opioids. -This "technique" was promoted by studies funded by [employer], such as [employer], creators of "OxyContin".  Can I stop the medicine "cold [place]"? We do not recommend it. You should always coordinate with your prescribing physician to make the transition as smoothly as possible. Avoid stopping the medicine abruptly without consulting. We recommend a "slow taper".  What is a slow taper? Taper: refers to the gradual decrease in dose.   How do I stop/taper the dose? Slowly. Decrease the daily amount of pills that you take by one (1) pill every seven (7) days. This is called a "slow downward taper". Example: if you normally take four (4) pills per day, drop it to three (3) pills per day for seven (7) days, then to two (2) pills per day for seven (7) days, then to one (1) per day for seven (7) days, and then stop the medicine. The 14 day "Drug Holiday" starts on the first day without medicine.   Will I experience withdrawals? Unlikely with a slow taper.  What triggers withdrawals? Withdrawals are triggered by the sudden/abrupt stop of high dose opioids. Withdrawals can be avoided by slowly  decreasing the dose over a prolonged period of time.  What are withdrawals? Symptoms associated with sudden/abrupt reduction/stopping of high-dose, long-term use of pain medication. Withdrawal are seldom seen on low dose therapy, or patients rarely taking opioid medication.  Early Withdrawal Symptoms may  include: Agitation Anxiety Muscle aches Increased tearing Insomnia Runny nose Sweating Yawning  Late symptoms may include: Abdominal cramping Diarrhea Dilated pupils Goose bumps Nausea Vomiting  When could I see withdrawals? Onset: 8-24 hours after last use for most opioids. 12-48 hours for long-acting opioids (i.e.: methadone)  How long could they last? Duration: 4-10 days for most opioids. 14-21 days for long-acting opioids (i.e.: methadone)  What will happen after I complete my "Drug Holiday"? The need and indications for the opioid analgesic will be reviewed before restarting the medication. Dose requirements will likely decrease and the dose will need to be adjusted accordingly.   (Last update: 05/01/2022) ____________________________________________________________________________________________    ____________________________________________________________________________________________  WARNING: CBD (cannabidiol) & Delta (Delta-8 tetrahydrocannabinol) products.   Applicable to:  All individuals currently taking or considering taking CBD (cannabidiol) and, more important, all patients taking opioid analgesic controlled substances (pain medication). (Example: oxycodone; oxymorphone; hydrocodone; hydromorphone; morphine; methadone; tramadol; tapentadol; fentanyl; buprenorphine; butorphanol; dextromethorphan; meperidine; codeine; etc.)  Introduction:  Recently there has been a drive towards the use of "natural" products for the treatment of different conditions, including pain anxiety and sleep disorders. Marijuana and hemp are two varieties of the cannabis genus plants. Marijuana and its derivatives are illegal, while hemp and its derivatives are not. Cannabidiol (CBD) and tetrahydrocannabinol (THC), are two natural compounds found in plants of the Cannabis genus. They can both be extracted from hemp or marijuana. Both compounds interact with your body's endocannabinoid  system in very different ways. CBD is associated with pain relief (analgesia) while THC is associated with the psychoactive effects ("the high") obtained from the use of marijuana products. There are two main types of THC: Delta-9, which comes from the marijuana plant and it is illegal, and Delta-8, which comes from the hemp plant, and it is legal. (Both, Delta-9-THC and Delta-8-THC are psychoactive and give you "the high".)   Legality:  Marijuana and its derivatives: illegal Hemp and its derivatives: Legal (State dependent) UPDATE: (03/30/2021) The Drug Enforcement Agency (DEA) issued a letter stating that "delta" cannabinoids, including Delta-8-THCO and Delta-9-THCO, synthetically derived from hemp do not qualify as hemp and will be viewed as Schedule I drugs. (Schedule I drugs, substances, or chemicals are defined as drugs with no currently accepted medical use and a high potential for abuse. Some examples of Schedule I drugs are: heroin, lysergic acid diethylamide (LSD), marijuana (cannabis), 3,4-methylenedioxymethamphetamine (ecstasy), methaqualone, and peyote.) ([employer].ee)  Legal status of CBD in [place]:  "Conditionally Legal"  Reference: "FDA Regulation of Cannabis and Cannabis-Derived Products, Including Cannabidiol (CBD)" - [employer]  Warning:  CBD is not FDA approved and has not undergo the same manufacturing controls as prescription drugs.  This means that the purity and safety of available CBD may be questionable. Most of the time, despite manufacturer's claims, it is contaminated with THC (delta-9-tetrahydrocannabinol - the chemical in marijuana responsible for the "HIGH").  When this is the case, the [facility] contaminant will trigger a positive urine drug screen (UDS) test for Marijuana (carboxy-THC).   The FDA recently put out a warning about 5 things that everyone  should be aware of regarding Delta-8 THC: Delta-8 THC products have not been evaluated or approved by the FDA for safe use and may be marketed  in ways that put the public health at risk. The FDA has received adverse event reports involving delta-8 THC-containing products. Delta-8 THC has psychoactive and intoxicating effects. Delta-8 THC manufacturing often involve use of potentially harmful chemicals to create the concentrations of delta-8 THC claimed in the marketplace. The final delta-8 THC product may have potentially harmful by-products (contaminants) due to the chemicals used in the process. Manufacturing of delta-8 THC products may occur in uncontrolled or unsanitary settings, which may lead to the presence of unsafe contaminants or other potentially harmful substances. Delta-8 THC products should be kept out of the reach of children and pets.  NOTE: Because a positive UDS for any illicit substance is a violation of our medication agreement, your opioid analgesics (pain medicine) may be permanently discontinued.  MORE ABOUT CBD  General Information: CBD was discovered in [birth year] and it is a derivative of the cannabis sativa genus plants (Marijuana and Hemp). It is one of the 113 identified substances found in Marijuana. It accounts for up to 40% of the plant's extract. As of 2018, preliminary clinical studies on CBD included research for the treatment of anxiety, movement disorders, and pain. CBD is available and consumed in multiple forms, including inhalation of smoke or vapor, as an aerosol spray, and by mouth. It may be supplied as an oil containing CBD, capsules, dried cannabis, or as a liquid solution. CBD is thought not to be as psychoactive as THC (delta-9-tetrahydrocannabinol - the chemical in marijuana responsible for the "HIGH"). Studies suggest that CBD may interact with different biological target receptors in the body, including cannabinoid and other neurotransmitter receptors. As of  2018 the mechanism of action for its biological effects has not been determined.  Side-effects  Adverse reactions: Dry mouth, diarrhea, decreased appetite, fatigue, drowsiness, malaise, weakness, sleep disturbances, and others.  Drug interactions:  CBD may interact with medications such as blood-thinners. CBD causes drowsiness on its own and it will increase drowsiness caused by other medications, including antihistamines (such as Benadryl), benzodiazepines (Xanax, Ativan, Valium), antipsychotics, antidepressants, opioids, alcohol and supplements such as kava, melatonin and St. John's Wort.  Other drug interactions: Brivaracetam (Briviact); Caffeine; Carbamazepine (Tegretol); Citalopram (Celexa); Clobazam (Onfi); Eslicarbazepine (Aptiom); Everolimus (Zostress); Lithium; Methadone (Dolophine); Rufinamide (Banzel); Sedative medications (CNS depressants); Sirolimus (Rapamune); Stiripentol (Diacomit); Tacrolimus (Prograf); Tamoxifen ; Soltamox); Topiramate (Topamax); Valproate; Warfarin (Coumadin); Zonisamide. (Last update: 01/21/2022) ____________________________________________________________________________________________   ____________________________________________________________________________________________  Naloxone Nasal Spray  Why am I receiving this medication? [place] STOP ACT requires that all patients taking high dose opioids or at risk of opioids respiratory depression, be prescribed an opioid reversal agent, such as Naloxone (AKA: Narcan).  What is this medication? NALOXONE (nal OX one) treats opioid overdose, which causes slow or shallow breathing, severe drowsiness, or trouble staying awake. Call emergency services after using this medication. You may need additional treatment. Naloxone works by reversing the effects of opioids. It belongs to a group of medications called opioid blockers.  COMMON BRAND NAME(S): Kloxxado, Narcan  What should I tell my care team before  I take this medication? They need to know if you have any of these conditions: Heart disease Substance use disorder An unusual or allergic reaction to naloxone, other medications, foods, dyes, or preservatives Pregnant or trying to get pregnant Breast-feeding  When to use this medication? This medication is to be used for the treatment of respiratory depression (less than 8 breaths per minute) secondary to opioid overdose.   How to use this medication? This medication is for use  in the nose. Lay the person on their back. Support their neck with your hand and allow the head to tilt back before giving the medication. The nasal spray should be given into 1 nostril. After giving the medication, move the person onto their side. Do not remove or test the nasal spray until ready to use. Get emergency medical help right away after giving the first dose of this medication, even if the person wakes up. You should be familiar with how to recognize the signs and symptoms of a narcotic overdose. If more doses are needed, give the additional dose in the other nostril. Talk to your care team about the use of this medication in children. While this medication may be prescribed for children as young as newborns for selected conditions, precautions do apply.  Naloxone Overdosage: If you think you have taken too much of this medicine contact a poison control center or emergency room at once.  NOTE: This medicine is only for you. Do not share this medicine with others.  What if I miss a dose? This does not apply.  What may interact with this medication? This is only used during an emergency. No interactions are expected during emergency use. This list may not describe all possible interactions. Give your health care provider a list of all the medicines, herbs, non-prescription drugs, or dietary supplements you use. Also tell them if you smoke, drink alcohol, or use illegal drugs. Some items may interact with  your medicine.  What should I watch for while using this medication? Keep this medication ready for use in the case of an opioid overdose. Make sure that you have the phone number of your care team and local hospital ready. You may need to have additional doses of this medication. Each nasal spray contains a single dose. Some emergencies may require additional doses. After use, bring the treated person to the nearest hospital or call 911. Make sure the treating care team knows that the person has received a dose of this medication. You will receive additional instructions on what to do during and after use of this medication before an emergency occurs.  What side effects may I notice from receiving this medication? Side effects that you should report to your care team as soon as possible: Allergic reactions--skin rash, itching, hives, swelling of the face, lips, tongue, or throat Side effects that usually do not require medical attention (report these to your care team if they continue or are bothersome): Constipation Dryness or irritation inside the nose Headache Increase in blood pressure Muscle spasms Stuffy nose Toothache This list may not describe all possible side effects. Call your doctor for medical advice about side effects. You may report side effects to FDA at 1-800-FDA-1088.  Where should I keep my medication? Because this is an emergency medication, you should keep it with you at all times.  Keep out of the reach of children and pets. Store between 20 and 25 degrees C (68 and 77 degrees F). Do not freeze. Throw away any unused medication after the expiration date. Keep in original box until ready to use.  NOTE: This sheet is a summary. It may not cover all possible information. If you have questions about this medicine, talk to your doctor, pharmacist, or health care provider.   2023 Elsevier/Gold Standard (2020-10-06  00:00:00)  ____________________________________________________________________________________________   ____________________________________________________________________________________________  Patient Information update  To: All of our patients.  Re: Name change.  It has been made official that our current  name, "[facility]"   will soon be changed to "[facility]".   The purpose of this change is to eliminate any confusion created by the concept of our practice being a "Medication Management Pain Clinic". In the past this has led to the misconception that we treat pain primarily by the use of prescription medications.  Nothing can be farther from the truth.   Understanding PAIN MANAGEMENT: To further understand what our practice does, you first have to understand that "Pain Management" is a subspecialty that requires additional training once a physician has completed their specialty training, which can be in either Anesthesia, Neurology, Psychiatry, or Physical Medicine and Rehabilitation (PMR). Each one of these contributes to the final approach taken by each physician to the management of their patient's pain. To be a "Pain Management Specialist" you must have first completed one of the specialty trainings below.  Anesthesiologists - trained in clinical pharmacology and interventional techniques such as nerve blockade and regional as well as central neuroanatomy. They are trained to block pain before, during, and after surgical interventions.  Neurologists - trained in the diagnosis and pharmacological treatment of complex neurological conditions, such as Multiple Sclerosis, Parkinson's, spinal cord injuries, and other systemic conditions that may be associated with symptoms that may include but are not limited to pain. They tend to rely primarily on the treatment of chronic  pain using prescription medications.  Psychiatrist - trained in conditions affecting the psychosocial wellbeing of patients including but not limited to depression, anxiety, schizophrenia, personality disorders, addiction, and other substance use disorders that may be associated with chronic pain. They tend to rely primarily on the treatment of chronic pain using prescription medications.   Physical Medicine and Rehabilitation (PMR) physicians, also known as physiatrists - trained to treat a wide variety of medical conditions affecting the brain, spinal cord, nerves, bones, joints, ligaments, muscles, and tendons. Their training is primarily aimed at treating patients that have suffered injuries that have caused severe physical impairment. Their training is primarily aimed at the physical therapy and rehabilitation of those patients. They may also work alongside orthopedic surgeons or neurosurgeons using their expertise in assisting surgical patients to recover after their surgeries.  INTERVENTIONAL PAIN MANAGEMENT is sub-subspecialty of Pain Management.  Our physicians are Board-certified in Anesthesia, Pain Management, and Interventional Pain Management.  This meaning that not only have they been trained and Board-certified in their specialty of Anesthesia, and subspecialty of Pain Management, but they have also received further training in the sub-subspecialty of Interventional Pain Management, in order to become Board-certified as INTERVENTIONAL PAIN MANAGEMENT SPECIALIST.    Mission: Our goal is to use our skills in  INTERVENTIONAL PAIN MANAGEMENT as alternatives to the chronic use of prescription opioid medications for the treatment of pain. To make this more clear, we have changed our name to reflect what we do and offer. We will continue to offer medication management assessment and recommendations, but we will not be taking over any patient's medication  management.  ____________________________________________________________________________________________

## 2022-07-15 NOTE — Progress Notes (Signed)
PROVIDER NOTE: Information contained herein reflects review and annotations entered in association with encounter. Interpretation of such information and data should be left to medically-trained personnel. Information provided to patient can be located elsewhere in the medical record under "Patient Instructions". Document created using STT-dictation technology, any transcriptional errors that may result from process are unintentional.    Patient: Mary Cruz  Service Category: E/M  Provider: Oswaldo Done, MD  DOB: 09/14/1975  DOS: 07/15/2022  Referring Provider: Christen Butter, NP  MRN: [id number]  Specialty: Interventional Pain Management  PCP: Christen Butter, NP  Type: Established Patient  Setting: Ambulatory outpatient    Location: Office  Delivery: Face-to-face     HPI  [name], a 47 y.o. year old female, is here today because of her No primary diagnosis found.. Ms. Suttner primary complain today is Back Pain (lower)  Pertinent problems: Ms. Klemme has Chronic pain syndrome; Chronic musculoskeletal pain; Osteoarthrosis; Presence of functional implant ([employer] Lumbar spinal cord stimulator implant); Chronic low back pain (Bilateral) (R>L) w/o sciatica; Chronic lumbar radicular pain (Right L5 dermatome; Left S1 Dermatome) (Bilateral) (R>L); Chronic neck pain (1ry area of Pain) (Bilateral) (L>R); Chronic cervical radicular pain (C7 Dermatome) (Bilateral) (L>R); Chronic lower extremity pain (Bilateral) (R>L); Lumbar spondylosis; Lumbar facet syndrome (Bilateral) (R>L); Neurogenic pain; Fibromyalgia; Pain in right knee; Chronic upper extremity pain (2ry area of Pain) (Bilateral) (L>R); Chronic hip pain (3ry area of Pain) (Bilateral) (R>L); Closed fracture of scapula, sequela (Right); DDD (degenerative disc disease), cervical; Cervicalgia; Abnormal MRI, shoulder (Right); Spondylosis without myelopathy or radiculopathy, lumbosacral region; DDD (degenerative disc disease), lumbar;  Osteoarthritis involving multiple joints; Chronic low back pain (Bilateral) (R>L) w/ sciatica (Bilateral) (R>L); Chronic lower extremity pain (Right); Lumbosacral radiculopathy at L5 (Right); Dropfoot (Right); Lumbar facet arthropathy; Intractable low back pain; Closed fracture of multiple ribs of right side; Chronic feet pain (Bilateral); Migraines; Chronic hip pain (Right); Osteoarthritis of hip (Right); Grade 1 Retrolisthesis of L4/L5; Spondylolisthesis at L4-L5 level; Lumbosacral radiculopathy/radiculitis at L4 (Right); Lower extremity numbness (Bilateral); Complaints of weakness of lower extremities (Bilateral); and Abnormal MRI, lumbar spine (11/04/2021) on their pertinent problem list. Pain Assessment: Severity of Chronic pain is reported as a 5 /10. Location: Back Lower, Right, Left/down back of legs to bottom of foot,effects all toes. Onset: More than a month ago. Quality: Aching, Burning, Pins and needles, Numbness, Discomfort. Timing: Constant. Modifying factor(s): meds. restADL's, standing, prolonged walking. Vitals:  height is 5\' 4"  (1.626 m) and weight is 272 lb (123.4 kg). Her temperature is 97.1 F (36.2 C) (abnormal). Her blood pressure is 130/76 and her pulse is 104 (abnormal). Her respiration is 16 and oxygen saturation is 99%.  BMI: Estimated body mass index is 46.69 kg/m as calculated from the following:   Height as of this encounter: 5\' 4"  (1.626 m).   Weight as of this encounter: 272 lb (123.4 kg). Last encounter: 05/01/2022. Last procedure: Visit date not found.  Reason for encounter: medication management.  The patient indicates doing well with the current medication regimen. No adverse reactions or side effects reported to the medications.  The patient was reminded that she needs to work on her weight.  She refers having completed her sleep study last night.  She recently had a CT angiogram of her lungs to rule out pulmonary embolism.  Distally was negative.  RTCB: 11/07/2022    Pharmacotherapy Assessment  Analgesic: Oxycodone IR 5 mg, 1 tab PO q 6 hrs (20 mg/day of oxycodone) MME/day: 30 mg/day.  Monitoring: [place] PMP: PDMP reviewed during this encounter.       Pharmacotherapy: No side-effects or adverse reactions reported. Compliance: No problems identified. Effectiveness: Clinically acceptable.  Newman Pies, RN  07/15/2022 11:48 AM  Sign when Signing Visit Nursing Pain Medication Assessment:  Safety precautions to be maintained throughout the outpatient stay will include: orient to surroundings, keep bed in low position, maintain call bell within reach at all times, provide assistance with transfer out of bed and ambulation.  Medication Inspection Compliance: Pill count conducted under aseptic conditions, in front of the patient. Neither the pills nor the bottle was removed from the patient's sight at any time. Once count was completed pills were immediately returned to the patient in their original bottle.  Medication: Oxycodone IR Pill/Patch Count:  115 of 120 pills remain Pill/Patch Appearance: Markings consistent with prescribed medication Bottle Appearance: Standard pharmacy container. Clearly labeled. Filled Date: 5 / 21 / 2024 Last Medication intake:  Today    No results found for: "CBDTHCR" No results found for: "D8THCCBX" No results found for: "D9THCCBX"  UDS:  Summary  Date Value Ref Range Status  10/22/2021 Note  Final    Comment:    ==================================================================== [employer] Select 13 (MW) ==================================================================== Test                             Result       Flag       Units  Drug Present and Declared for Prescription Verification   Oxycodone                      178          EXPECTED   ng/mg creat   Oxymorphone                    261          EXPECTED   ng/mg creat   Noroxycodone                   1146         EXPECTED   ng/mg creat   Noroxymorphone                  143          EXPECTED   ng/mg creat    Sources of oxycodone are scheduled prescription medications.    Oxymorphone, noroxycodone, and noroxymorphone are expected    metabolites of oxycodone. Oxymorphone is also available as a    scheduled prescription medication.  ==================================================================== Test                      Result    Flag   Units      Ref Range   Creatinine              103              mg/dL      >=16 ==================================================================== Declared Medications:  The flagging and interpretation on this report are based on the  following declared medications.  Unexpected results may arise from  inaccuracies in the declared medications.   **Note: The testing scope of this panel includes these medications:   Oxycodone (Roxicodone)   **Note: The testing scope of this panel does not include the  following reported medications:   Amitriptyline (Elavil)  Benzonatate (Tessalon)  Biotin  Duloxetine (Cymbalta)  Hydrochlorothiazide (Zestoretic)  Hyoscyamine (Levsin)  Levocetirizine (Xyzal)  Lisinopril (Zestoretic)  Methocarbamol (Robaxin)  Pantoprazole (Protonix)  Probiotic  Rizatriptan (Maxalt)  Topiramate (Topamax) ==================================================================== For clinical consultation, please call [phone]. ====================================================================       ROS  Constitutional: Denies any fever or chills Gastrointestinal: No reported hemesis, hematochezia, vomiting, or acute GI distress Musculoskeletal: Denies any acute onset joint swelling, redness, loss of ROM, or weakness Neurological: No reported episodes of acute onset apraxia, aphasia, dysarthria, agnosia, amnesia, paralysis, loss of coordination, or loss of consciousness  Medication Review  DULoxetine, Fezolinetant, Full Kit Nebulizer Set, Probiotic Product, SUMAtriptan,  albuterol, benzonatate, budesonide, busPIRone, famotidine, hyoscyamine, levocetirizine, lisinopril-hydrochlorothiazide, meclizine, methocarbamol, naloxone, nortriptyline, ondansetron, oxyCODONE, pantoprazole, and promethazine-dextromethorphan  History Review  Allergy: Ms. Kosin is allergic to celecoxib, doxycycline, gabapentin, latex, nabumetone, pentazocine lactate, seroquel [quetiapine], amoxicillin, bactrim [sulfamethoxazole-trimethoprim], cephalexin, erythromycin, hydromorphone hcl, and lyrica [pregabalin]. Drug: Ms. Tsutsui  reports no history of drug use. Alcohol:  reports no history of alcohol use. Tobacco:  reports that she quit smoking about 2 years ago. Her smoking use included cigarettes. She quit smokeless tobacco use about 4 years ago. Social: Ms. Vandenheuvel  reports that she quit smoking about 2 years ago. Her smoking use included cigarettes. She quit smokeless tobacco use about 4 years ago. She reports that she does not drink alcohol and does not use drugs. Medical:  has a past medical history of Abscess of axillary fold (10/10/2013), Acute bronchitis with asthma with acute exacerbation (01/23/2015), Dehydration (04/19/2019), Elevated lactic acid level (04/19/2019), Encounter for management of implanted device (12/19/2014), Encounter for therapeutic drug level monitoring (12/19/2014), Fibromyalgia, GERD (gastroesophageal reflux disease), Hyperglycemia (04/19/2019), Hypertension, Implant pain at battery site (right buttocks area) (04/05/2015), KIDNEY STONES (07/02/2007), Long term current use of opiate analgesic (12/19/2014), Morbid obesity (HCC) (12/19/2014), Muscle cramping (12/19/2014), Opiate use (30 MME/Day) (12/19/2014), Osteoarthritis, Pain, Right knee pain (01/10/2015), Sepsis (HCC) (04/19/2019), Sepsis affecting skin (10/10/2013), and Tobacco abuse (04/19/2019). Surgical: Ms. Kanter  has a past surgical history that includes spinal cord stimulator; Occipital nerve stimulator  insertion; Cholecystectomy (2000); Knee surgery; Shoulder surgery; Tubal ligation (2000); Endometrial ablation; Spinal cord stimulator implant (2008); Knee surgery (Right, 02/2015); Breast surgery; Spinal cord stimulator insertion (N/A, 06/21/2016); Shoulder arthroscopy with distal clavicle resection (Right, 10/29/2018); Subacromial decompression (Right, 10/29/2018); laparoscopic appendectomy (Right, 01/01/2020); Appendectomy; Ankle surgery; Colonoscopy with esophagogastroduodenoscopy (egd); Colonoscopy (N/A, 02/20/2021); and Esophagogastroduodenoscopy (N/A, 02/20/2021). Family: family history includes Asthma in her mother; Depression in her mother; Fibromyalgia in an other family member; Stroke in an other family member; Thyroid disease in an other family member.  Laboratory Chemistry Profile   Renal Lab Results  Component Value Date   BUN 15 05/26/2022   CREATININE 0.99 05/26/2022   LABCREA 51 11/20/2020   BCR 18 05/21/2022   GFRAA 84 08/03/2020   GFRNONAA >60 05/26/2022    Hepatic Lab Results  Component Value Date   AST 17 05/21/2022   ALT 19 05/21/2022   ALBUMIN 3.4 (L) 10/13/2021   ALKPHOS 88 10/13/2021   LIPASE 24 10/13/2021    Electrolytes Lab Results  Component Value Date   NA 135 05/26/2022   K 3.4 (L) 05/26/2022   CL 102 05/26/2022   CALCIUM 8.6 (L) 05/26/2022   MG 2.0 04/20/2019   PHOS 3.2 04/20/2019    Bone Lab Results  Component Value Date   VD25OH 21 (L) 07/23/2021    Inflammation (CRP: Acute Phase) (ESR: Chronic Phase) Lab Results  Component Value Date   CRP 1.0 (H) 04/05/2015  ESRSEDRATE 28 (H) 11/20/2020   LATICACIDVEN 1.8 10/13/2021         Note: Above Lab results reviewed.  Recent Imaging Review  CT Angio Chest PE W and/or Wo Contrast CLINICAL DATA:  Shortness of breath, fever  EXAM: CT ANGIOGRAPHY CHEST WITH CONTRAST  TECHNIQUE: Multidetector CT imaging of the chest was performed using the standard protocol during bolus administration of  intravenous contrast. Multiplanar CT image reconstructions and MIPs were obtained to evaluate the vascular anatomy.  RADIATION DOSE REDUCTION: This exam was performed according to the departmental dose-optimization program which includes automated exposure control, adjustment of the mA and/or kV according to patient size and/or use of iterative reconstruction technique.  CONTRAST:  75mL OMNIPAQUE IOHEXOL 350 MG/ML SOLN  COMPARISON:  Chest radiograph dated 05/25/2022. CTA chest dated 04/19/2019.  FINDINGS: Cardiovascular: Satisfactory opacification of the bilateral pulmonary arteries to the lobar level. No evidence of pulmonary embolism.  Although not tailored for evaluation of the thoracic aorta, there is no evidence thoracic aortic aneurysm or dissection.  Heart is top-normal in size.  No pericardial effusion.  Mediastinum/Nodes: No suspicious mediastinal lymphadenopathy.  Visualized thyroid is unremarkable.  Lungs/Pleura: Evaluation lung parenchyma is constrained by respiratory motion. Within that constraint, there is a 7 x 3 mm triangular subpleural nodule along the minor fissure (series 6/image 86), unchanged from 2021, benign. No follow-up is recommended. No suspicious pulmonary nodules.  No focal consolidation.  No pleural effusion or pneumothorax.  Upper Abdomen: Visualized upper abdomen is notable for a prior cholecystectomy.  Musculoskeletal: Thoracic spine stimulator. Thoracic spine is within normal limits.  Review of the MIP images confirms the above findings.  IMPRESSION: No evidence of pulmonary embolism.  No evidence of acute cardiopulmonary disease.  Electronically Signed   By: Charline Bills M.D.   On: 05/26/2022 03:54 Note: Reviewed        Physical Exam  General appearance: Well nourished, well developed, and well hydrated. In no apparent acute distress Mental status: Alert, oriented x 3 (person, place, & time)       Respiratory: No  evidence of acute respiratory distress Eyes: PERLA Vitals: BP 130/76   Pulse (!) 104   Temp (!) 97.1 F (36.2 C)   Resp 16   Ht 5\' 4"  (1.626 m)   Wt 272 lb (123.4 kg)   SpO2 99%   BMI 46.69 kg/m  BMI: Estimated body mass index is 46.69 kg/m as calculated from the following:   Height as of this encounter: 5\' 4"  (1.626 m).   Weight as of this encounter: 272 lb (123.4 kg). Ideal: Ideal body weight: 54.7 kg (120 lb 9.5 oz) Adjusted ideal body weight: 82.2 kg (181 lb 2.5 oz)  Assessment   Diagnosis Status  1. Intractable low back pain   2. Chronic neck pain (1ry area of Pain) (Bilateral) (L>R)   3. Lumbar facet syndrome (Bilateral) (R>L)   4. Chronic upper extremity pain (2ry area of Pain) (Bilateral) (L>R)   5. Chronic hip pain (3ry area of Pain) (Bilateral) (R>L)   6. Chronic pain syndrome   7. Pharmacologic therapy   8. Chronic use of opiate for therapeutic purpose   9. Encounter for medication management   10. Encounter for chronic pain management    Controlled Controlled Controlled   Updated Problems: No problems updated.  Plan of Care  Problem-specific:  No problem-specific Assessment & Plan notes found for this encounter.  Ms. CRYSTALE PFOHL has a current medication list which includes the following long-term  medication(s): albuterol, albuterol, budesonide, duloxetine, famotidine, hyoscyamine, levocetirizine, lisinopril-hydrochlorothiazide, methocarbamol, oxycodone, pantoprazole, sumatriptan, [START ON 08/09/2022] oxycodone, [START ON 09/08/2022] oxycodone, and [START ON 10/08/2022] oxycodone.  Pharmacotherapy (Medications Ordered): Meds ordered this encounter  Medications   oxyCODONE (OXY IR/ROXICODONE) 5 MG immediate release tablet    Sig: Take 1 tablet (5 mg total) by mouth every 6 (six) hours as needed for severe pain. Must last 30 days    Dispense:  120 tablet    Refill:  0    DO NOT: delete (not duplicate); no partial-fill (will deny script to complete), no  refill request (F/U required). DISPENSE: 1 day early if closed on fill date. WARN: No CNS-depressants within 8 hrs of med.   oxyCODONE (OXY IR/ROXICODONE) 5 MG immediate release tablet    Sig: Take 1 tablet (5 mg total) by mouth every 6 (six) hours as needed for severe pain. Must last 30 days    Dispense:  120 tablet    Refill:  0    DO NOT: delete (not duplicate); no partial-fill (will deny script to complete), no refill request (F/U required). DISPENSE: 1 day early if closed on fill date. WARN: No CNS-depressants within 8 hrs of med.   oxyCODONE (OXY IR/ROXICODONE) 5 MG immediate release tablet    Sig: Take 1 tablet (5 mg total) by mouth every 6 (six) hours as needed for severe pain. Must last 30 days    Dispense:  120 tablet    Refill:  0    DO NOT: delete (not duplicate); no partial-fill (will deny script to complete), no refill request (F/U required). DISPENSE: 1 day early if closed on fill date. WARN: No CNS-depressants within 8 hrs of med.   Orders:  No orders of the defined types were placed in this encounter.  Follow-up plan:   Return in about 4 months (around 11/07/2022) for Eval-day (M,W), (F2F), (MM).      Interventional Therapies  Risk Factors  Considerations:   Latex allergy    Planned  Pending:   Diagnostic right hip x-rays and MRI (06/13/2021) Diagnostic/therapeutic right IA hip joint inj. #1    Under consideration:   Diagnostic/therapeutic right IA hip joint inj. #1  Diagnostic right suprascapular NB #1    Completed:   Palliative bilateral lumbar facet MBB x2 (11/16/2020) (90/90/80/85-90)  Therapeutic left lumbar facet MBB x2 (10/10/2016) (100/100/100/100)  Therapeutic right lumbar facet MBB x1 (08/31/2015) (n/a)  Therapeutic right lumbar facet RFA x3 (05/08/2021) (100/100/50/50) on the right side Therapeutic left lumbar facet RFA x3 (03/22/2021) (100/100/99/99) on left side Palliative left C7-T1 CESI x1 (04/13/2015) (n/a)    Therapeutic  Palliative (PRN) options:    Palliative lumbar facet MBB  Palliative lumbar facet RFA  Palliative C7-T1 CESI  PRN SCS programming adjustments (2018 replacement by Dr. Odette Fraction)   Pharmacotherapy  Nonopioids transferred 04/06/2020: Robaxin       Recent Visits Date Type Provider Dept  05/01/22 Office Visit Delano Metz, MD [facility]  Showing recent visits within past 90 days and meeting all other requirements Today's Visits Date Type Provider Dept  07/15/22 Office Visit Delano Metz, MD [facility]  Showing today's visits and meeting all other requirements Future Appointments No visits were found meeting these conditions. Showing future appointments within next 90 days and meeting all other requirements  I discussed the assessment and treatment plan with the patient. The patient was provided an opportunity to ask questions and all were answered. The patient agreed with the plan and  demonstrated an understanding of the instructions.  Patient advised to call back or seek an in-person evaluation if the symptoms or condition worsens.  Duration of encounter: 30 minutes.  Total time on encounter, as per AMA guidelines included both the face-to-face and non-face-to-face time personally spent by the physician and/or other qualified health care professional(s) on the day of the encounter (includes time in activities that require the physician or other qualified health care professional and does not include time in activities normally performed by clinical staff). Physician's time may include the following activities when performed: Preparing to see the patient (e.g., pre-charting review of records, searching for previously ordered imaging, lab work, and nerve conduction tests) Review of prior analgesic pharmacotherapies. Reviewing PMP Interpreting ordered tests (e.g., lab work, imaging, nerve conduction tests) Performing post-procedure evaluations, including interpretation of  diagnostic procedures Obtaining and/or reviewing separately obtained history Performing a medically appropriate examination and/or evaluation Counseling and educating the patient/family/caregiver Ordering medications, tests, or procedures Referring and communicating with other health care professionals (when not separately reported) Documenting clinical information in the electronic or other health record Independently interpreting results (not separately reported) and communicating results to the patient/ family/caregiver Care coordination (not separately reported)  Note by: Oswaldo Done, MD Date: 07/15/2022; Time: 12:27 PM

## 2022-07-15 NOTE — Progress Notes (Signed)
Nursing Pain Medication Assessment:  Safety precautions to be maintained throughout the outpatient stay will include: orient to surroundings, keep bed in low position, maintain call bell within reach at all times, provide assistance with transfer out of bed and ambulation.  Medication Inspection Compliance: Pill count conducted under aseptic conditions, in front of the patient. Neither the pills nor the bottle was removed from the patient's sight at any time. Once count was completed pills were immediately returned to the patient in their original bottle.  Medication: Oxycodone IR Pill/Patch Count:  115 of 120 pills remain Pill/Patch Appearance: Markings consistent with prescribed medication Bottle Appearance: Standard pharmacy container. Clearly labeled. Filled Date: 5 / 21 / 2024 Last Medication intake:  Today

## 2022-07-16 ENCOUNTER — Other Ambulatory Visit: Payer: Self-pay | Admitting: Medical-Surgical

## 2022-08-02 NOTE — Procedures (Signed)
[facility] Split Summary     [facility] SPLIT NIGHT INTERPRETATION REPORT   STUDY DATE: 07/14/2022     PATIENT NAME:  Mary Cruz         DATE OF BIRTH:  July 14, 1975  PATIENT ID:  [id number]    TYPE OF STUDY:  SPLIT  READING PHYSICIAN: Porfirio Mylar Zhoey Blackstock,  SCORING TECHNICIAN: Sheena Fields   INDICATIONS:  This 47 year-old Female endorsed the Epworth Sleepiness Scale at 17 out of 24 (scores above or equal to 10 are suggestive of hypersomnolence).  DESCRIPTION: A sleep technologist was in attendance for the duration of the recording.  Data collection, scoring, video monitoring, and reporting were performed in compliance with the AASM Manual for the Scoring of Sleep and Associated Events; (Hypopnea is scored based on the criteria listed in Section VIII D. 1b in the AASM Manual V2.6 using a 4% oxygen desaturation rule or Hypopnea is scored based on the criteria listed in Section VIII D. 1a in the AASM Manual V2.6 using 3% oxygen desaturation and /or arousal rule).  A physician certified by the American Board of Sleep Medicine reviewed each epoch of the study.  ADDITIONAL INFORMATION:  Height: 64.0 in Weight: 272 lb (BMI 46) Neck Size: 17.0 ".  Medications: Proventil, Ventolin, Tessalon, Pulmicort, Buspar, Cymbalta, Pepcid, Veozah, Levsin SL, Xyzal, Zestoretic, Antivert, Robaxin, Narcan, Pamelor, Zofran, Oxycodone, Protonix, Prednisone, Probiotic, Promethazine, Imitrex  TOTAL STUDY DETAILS: Lights off was at 22:18: and lights on 05:05: (408 minutes hours in bed). This study was performed with an initial diagnostic portion followed by positive airway pressure titration.  Part 1 DIAGNOSTIC ANALYSIS   SLEEP CONTINUITY AND SLEEP ARCHITECTURE:  The diagnostic portion of the study began at 22:18 and ended at 02:00, for a recording time of 3h 42.81m minutes.  Total sleep time was 123 minutes (26.8% supine;  73.2% lateral;  0.0%  prone, 0.0% REM sleep), with a decreased sleep efficiency at 55.4%. Sleep latency was increased at 82.0 minutes.  Of the total sleep time, the percentage of stage N1 sleep was 9.3%, stage N2 sleep was 56.1%, stage N3 sleep was 34.6%, and REM sleep was 0.0%. There were 0 Stage R periods observed during this portion of the study, 19 awakenings (i.e. transitions to Stage W from any sleep stage), and 62.0 total stage transitions. Wake after sleep onset (WASO) time accounted for 17 minutes.   BODY POSITION: Duration of total sleep and percent of total sleep in their respective position is as follows: supine 33 minutes (26.8%), non-supine 90.0 minutes (73.2%); right 65 minutes (53.3%), left 24 minutes (19.9%), and prone 00 minutes (0.0%). Total supine REM sleep time was 00 minutes (0.0% of total REM sleep).      RESPIRATORY MONITORING:  Based on CMS criteria (using a 4% oxygen desaturation rule for scoring hypopneas), there were 170 apneas (3 obstructive; 167 central; 0 mixed), and 156 hypopneas.   Apnea index was 1.0. Hypopnea index was 41.5.  The apnea-hypopnea index was 42.4 overall (11.2 supine; 0.0 REM, 0.0 supine REM). There were 0 respiratory effort-related arousals (RERAs).  The RERA index was 0.0 events/hr.   Based on AASM criteria (using a 3% oxygen desaturation and /or arousal rule for scoring hypopneas), there were 170 apneas (3 obstructive; 167 central; 0 mixed), and 156 hypopneas.   Apnea index was 1.0. Hypopnea index was 53.7.  The apnea-hypopnea index was 54.6 overall (14.6 supine; 0.0 REM, 0.0 supine REM). There were 0  respiratory effort-related arousals (RERAs).  Moderate to loud snoring was noted.    OXIMETRY: Total sleep time spent at, or below 88% was 2.0 minutes, or 1.6% of total sleep time.  Respiratory events were associated with oxyhemoglobin desaturations (nadir 84%) from a normal baseline (mean 95%). Total time spent at, or below 88% was 3.5 minutes, or 2.9%  of total sleep  time. Cheyne Stokes breathing.   LIMB MOVEMENTS: There were 0 periodic limb movements of sleep (0.0/hr), of which 0 (0.0/hr) were associated with an arousal.    EKG: Analysis of electrocardiogram activity showed the highest heart rate for the baseline portion of the study was 105.0 beats per minute.  The average heart rate during sleep was 92 bpm, while the lowest  heart rate for the same period was 82 bpm.    AROUSAL (Baseline):  Arousal index was 96 /hr. Of these, 66.0 were identified as respiratory-related arousals (32.2 /hr), 0 were PLM-related arousals (0.0 /hr), and 30 were non-specific arousals (14.6 /hr)      PART 2 TREATMENT ANALYSIS  SLEEP CONTINUITY AND SLEEP ARCHITECTURE:  The treatment portion of the study began at 02:00 and ended at 05:05, for a recording time of 3 h 6.62m minutes.    The patient received an Evora FX mask in [place] and was titrated to CPAP at first, than to BiPAP and finally BiPAP ST without improvement of apnea.     Total sleep time was 153 minutes (22.2% supine; 77.8% lateral; 0.0% prone, now with 31.7% REM sleep), with a decreased sleep efficiency at 82.3%. Sleep latency was normal at 14.0 minutes. REM sleep latency was decreased at 49.5 minutes.   Of the total sleep time, the percentage of stage N1 sleep was 2.9%, stage N3 sleep was 19.6%, and REM sleep was 31.7%. There were 1 Stage R periods observed during this portion of the study, 6 awakenings (i.e. transitions to Stage W from any sleep stage), and 19.0 total stage transitions. Wake after sleep onset (WASO) time accounted for 19 minutes.    RESPIRATORY MONITORING:    While on PAP therapy, based on CMS criteria, the apnea-hypopnea index was 93.7 overall (20.0 supine; 5.5 REM).   While on PAP therapy, based on AASM criteria, the apnea-hypopnea index was 98.8 overall (21.2 supine; 8.2 REM).     BODY POSITION: Duration of total sleep and percent of total sleep in their respective position is as  follows: supine 34 minutes (22.2%), non-supine 119.0 minutes (77.8%); right 00 minutes (0.0%), left 119 minutes (77.8%), and prone 00 minutes (0.0%). Total supine REM sleep time was 00 minutes (0.0% of total REM sleep).  LIMB MOVEMENTS: There were 0 periodic limb movements of sleep (0.0/h), of which 0 (0.0/h) were associated with an arousal.   OXIMETRY: Total sleep time spent at, or below 88% was 0.7 minutes, or 0.4% of total sleep time. Snoring was classified as . Respiratory events were associated with oxyhemoglobin desaturation (nadir 87.0%) from a mean of 97.0%.  Total time spent at, or below 88% was 5.5 minutes, or 3.6% of total sleep time.  Snoring was improved.   CARDIAC:  Analysis of electrocardiogram activity showed the highest heart rate for the treatment portion of the study was 105.0 beats per minute.  The average heart rate during sleep was 82 bpm, while the highest heart rate for the same period was 100 bpm.  AROUSAL: Arousal index was 9.4 /hr. There were 24 arousals in total, of  these, 11.0 were identified as respiratory-related  arousals (4.3 /hr), 0 were PLM-related arousals (0.0 /hr), and 13 were non-specific arousals (5.1 /hr)      IMPRESSION: This SPLIT protocol PSG confirmed a diagnosis of severe central sleep apnea at an AHI of 55/h and was titrated under a Evora FFM size XS. The  CPAP was provoking more central apneas and therapy was switched to BiPAP at  13/9 cm water , from there to 16/ 12 cm BiPAP and finally switched to ST 17/13 cm water and ST 10. Titration could not be completed due to time restraints.   At no time during this titration attempt was there a significant reduction in AHI achieved- the AHI rose further under BiPAP and BiPAP ST.    RECOMMENDATIONS: Central Sleep Apnea is strongly related to the use of narcotics/ opiates.  This condition cannot be treated by CPAP or BiPAP .  I will not be able to provide any further care for this patient      Melvyn Novas, MD           [facility] CPAP/Bilevel Report    General Information  Name: Mary Cruz, Mary Cruz BMI: 16 Physician: ,   ID: [id number] Height: 64 in Technician: Margaretann Loveless  Sex: Female Weight: 272 lb Record: [id number]  Age: 31 [1975/12/24] Date: 07/14/2022 Scorer: Zenon Mayo Fields   Recommended Settings IPAP: N/A cmH20 EPAP: N/A cmH2O AHI: N/A AHI (4%): N/A                    Pressure IPAP/EPAP 00 04 / 00 05 07 09 13 / 09 14 / 10 15 / 11   O2 Vol 0.0 0.0 0.0 0.0 0.0 0.0 0.0 0.0  Time TRT 226.44m 1.58m 17.52m 11.15m 14.36m 26.17m 16.84m 17.1m   TST 123.2m 0.25m 7.47m 10.56m 14.110m 22.59m 16.56m 16.37m  Sleep Stage % Wake 14.6 100.0 55.9 9.1 0.0 15.1 0.0 5.7   % REM 0.0 0.0 0.0 0.0 0.0 44.4 100.0 100.0   % N1 9.3 0.0 33.3 0.0 0.0 4.4 0.0 0.0   % N2 56.1 0.0 66.7 100.0 100.0 44.4 0.0 0.0   % N3 34.6 0.0 0.0 0.0 0.0 6.7 0.0 0.0  Respiratory Total Events 112 0 4 9 36 27 8 7    Obs. Apn. 2 0 0 1 0 0 0 0   Mixed Apn. 0 0 0 0 0 0 0 0   Cen. Apn. 0 0 0 5 33 16 0 1   Hypopneas 110 0 4 3 3 11 8 6    AHI 54.63 0.00 32.00 54.00 154.29 72.00 29.09 25.45   Supine AHI 54.55 0.00 32.00 54.00 154.29 120.00 0.00 0.00   Prone AHI 0.00 0.00 0.00 0.00 0.00 0.00 0.00 0.00   Side AHI 54.67 0.00 0.00 0.00 0.00 66.00 29.09 25.45  Respiratory (4%) Hypopneas (4%) 85.00 0.00 1.00 3.00 3.00 9.00 3.00 5.00   AHI (4%) 42.44 0.00 8.00 54.00 154.29 66.67 10.91 21.82   Supine AHI (4%) 41.82 0.00 8.00 54.00 154.29 120.00 0.00 0.00   Prone AHI (4%) 0.00 0.00 0.00 0.00 0.00 0.00 0.00 0.00   Side AHI (4%) 42.67 0.00 0.00 0.00 0.00 60.00 10.91 21.82  Desat Profile <= 90% 13.17m 0.4m 0.92m 0.32m 0.33m 2.70m 1.75m 0.16m   <= 80% 5.34m 0.25m 0.80m 0.12m 0.40m 0.35m 0.70m 0.63m   <= 70% 5.44m 0.63m 0.75m 0.60m 0.19m 0.66m 0.52m 0.39m   <= 60% 5.17m 0.46m 0.68m 0.100m 0.41m 0.36m 0.25m 0.99m  Arousal Index Apnea 1.0  0.0 0.0 0.0 0.0 5.3 0.0 0.0   Hypopnea 31.2 0.0 8.0 0.0 0.0 5.3 7.3 0.0   LM 0.0 0.0 0.0  0.0 0.0 0.0 0.0 0.0   Spontaneous 14.6 0.0 48.0 18.0 4.3 0.0 0.0 3.6                Pressure IPAP/EPAP 16 / 12 16 / 12 ST 17 / 13 ST   O2 Vol 0.0 0.0 0.0  Time TRT 34.6m 6.17m 38.55m   TST 22.57m 6.54m 38.58m  Sleep Stage % Wake 36.2 0.0 0.0   % REM 25.0 0.0 0.0   % N1 4.5 0.0 0.0   % N2 70.5 100.0 25.0   % N3 0.0 0.0 75.0  Respiratory AASM Total Events 40 17 104   Obs. Apn. 0 0 0   Mixed Apn. 0 0 0   Cen. Apn. 38 16 58   Hypopneas 2 1 46   AHI 109.09 170.00 164.21   Supine AHI 0.00 0.00 0.00   Prone AHI 0.00 0.00 0.00   Side AHI 109.09 170.00 164.21  Respiratory (4%) Hypopneas (4%) 2.00 1.00 44.00   AHI (4%) 109.09 170.00 161.05   Supine AHI (4%) 0.00 0.00 0.00   Prone AHI (4%) 0.00 0.00 0.00   Side AHI (4%) 109.09 170.00 161.05  Desat Profile <= 90% 2.49m 0.42m 0.2m   <= 80% 0.55m 0.67m 0.57m   <= 70% 0.46m 0.69m 0.56m   <= 60% 0.28m 0.51m 0.62m  Arousal Index Apnea 8.2 0.0 0.0   Hypopnea 2.7 0.0 0.0   LM 0.0 0.0 0.0   Spontaneous 5.5 0.0 0.0   General Information  Name: Mary Cruz, Mary Cruz BMI: 46.69 Physician: Melvyn Novas, MD  ID: [id number] Height: 64.0 in Technician: Margaretann Loveless, RPSGT  Sex: Female Weight: 272.0 lb Record: [id number]  Age: 26 [02/26/1975] Date: 07/14/2022     Medical & Medication History    Mary Cruz is a 47 year old female, seen in request by her primary care [facility] nurse practitioner Christen Butter, for evaluation of bilateral hands, feet paresthesia, initial evaluation was on February 05, 2022  to Dr Terrace Arabia.   I reviewed and summarized the referring note. PMHx Depression, anxiety HTN Kidney stone. Chronic low back pain, under pain management, s/p spinal cord stimulator placement in May 2018, oxycodone 5mg  x4 tabs a day prn. Chronic migraine Quit smoke since Nov 2023. She had long history of headaches, getting worse over the past few years, despite taking Topamax 50 mg every night, she has migraine once or twice each week, lateralized  severe pounding headache with light noise sensitivity, Maxalt 10 mg as needed was helpful, but she has to take second dose 50% of the time, sleep always helps. Also history of numbness tingling, involving all 5 fingers on both hands, to the point she dropped things from her hands, sometimes woke her up from sleep, have to shake her hands to make the sensation come back  Proventil, Ventolin, Tessalon, Pulmicort, Buspar, Cymbalta, Pepcid, Veozah, Levsin SL, Xyzal, Zestoretic, Antivert, Robaxin, Narcan, Pamelor, Zofran, Oxycodone, Protonix, Prednisone, Probiotic, Promethazine, Imitrex   Sleep Disorder      Comments   The patient came into the lab for a SPLIT night study. The patient was split due to having an AHI greater than 10 after 2 hours of total sleep time per MD's order. The patient took Oxycodone, Cymbalta, Robaxin, Buspar, Amitriptyline, and Nortriptyline prior to start of sleep study. The patient was fitted with a ResMed N20 (  nasal) size small. CPAP was started at [facility] with no EPR. CPAP was increased to 9cmH2O with EPR of 1. The patient started having CSA. The patient was switched to BiPAP 13/9 cmH2O. Nasal interface started leaking once the patient was switched to BiPAP. Tech switched interface to F&P Evora (FFM) size XS. The mask was a good fit to face, and the patient tolerate the mask well. BiPAP was titrated up to 16/12 cmH2O. The patient was still having CSA and hypopneas. Patient was switched to BiPAP ST 17/13 BUR of 10 due to having straight CSA. Tech wanted to see if BiPAP ST would improve CSA. Running out of time to titrate patient. The patient had one restroom break. EKG kept in NSR. Moderate to loud snoring. All sleep stages witnessed. Respiratory events scored with a 3% desat. Slept lateral and supine. The patient did not need oxygen. AHI was 55.1 after 2 hrs of TST.    Baseline Sleep Stage Information Baseline start time: 10:18:18 PM Baseline end time: 02:00:12 AM   Time Total Supine  Side Prone Upright  Recording 3h 42.33m 2h 0.53m 1h 41.62m 0h 0.22m 0h 0.57m  Sleep 2h 3.59m 0h 33.63m 1h 30.74m 0h 0.11m 0h 0.36m   Latency N1 N2 N3 REM Onset Per. Slp. Eff.  Actual 1h 22.52m 1h 26.66m 2h 9.12m 0h 0.39m 1h 22.34m 1h 50.1m 55.41%   Stg Dur Wake N1 N2 N3 REM  Total 17.0 11.5 69.0 42.5 0.0  Supine 5.5 4.5 28.5 0.0 0.0  Side 11.5 7.0 40.5 42.5 0.0  Prone 0.0 0.0 0.0 0.0 0.0  Upright 0.0 0.0 0.0 0.0 0.0   Stg % Wake N1 N2 N3 REM  Total 12.1 9.3 56.1 34.6 0.0  Supine 3.9 3.7 23.2 0.0 0.0  Side 8.2 5.7 32.9 34.6 0.0  Prone 0.0 0.0 0.0 0.0 0.0  Upright 0.0 0.0 0.0 0.0 0.0    CPAP Sleep Stage Information CPAP start time: 02:00:12 AM CPAP end time: 05:05:57 AM   Time Total Supine Side Prone Upright  Recording (TRT) 3h 6.70m 0h 58.36m 2h 8.79m 0h 0.68m 0h 0.56m  Sleep (TST) 2h 33.27m 0h 34.67m 1h 59.49m 0h 0.38m 0h 0.79m   Latency N1 N2 N3 REM Onset Per. Slp. Eff.  Actual 0h 14.40m 0h 16.27m 0h 48.42m 0h 49.91m 0h 14.50m 0h 25.55m 82.26%   Stg Dur Wake N1 N2 N3 REM  Total 33.0 4.5 70.0 30.0 48.5  Supine 24.0 2.5 30.0 1.5 0.0  Side 9.0 2.0 40.0 28.5 48.5  Prone 0.0 0.0 0.0 0.0 0.0  Upright 0.0 0.0 0.0 0.0 0.0   Stg % Wake N1 N2 N3 REM  Total 17.7 2.9 45.8 19.6 31.7  Supine 12.9 1.6 19.6 1.0 0.0  Side 4.8 1.3 26.1 18.6 31.7  Prone 0.0 0.0 0.0 0.0 0.0  Upright 0.0 0.0 0.0 0.0 0.0    Baseline Respiratory Information Apnea Summary Sub Supine Side Prone Upright  Total 2 Total 2 0 2 0 0    REM 0 0 0 0 0    NREM 2 0 2 0 0  Obs 2 REM 0 0 0 0 0    NREM 2 0 2 0 0  Mix 0 REM 0 0 0 0 0    NREM 0 0 0 0 0  Cen 0 REM 0 0 0 0 0    NREM 0 0 0 0 0   Rera Summary Sub Supine Side Prone Upright  Total 0 Total 0 0 0 0 0  REM 0 0 0 0 0    NREM 0 0 0 0 0   Hypopnea Summary Sub Supine Side Prone Upright  Total 110 Total 110 30 80 0 0    REM 0 0 0 0 0    NREM 110 30 80 0 0   4% Hypopnea Summary Sub Supine Side Prone Upright  Total (4%) 85 Total 85 23 62 0 0    REM 0 0 0 0 0    NREM 85 23 62 0 0      AHI Total Obs Mix Cen  54.63 Apnea 0.98 0.98 0.00 0.00   Hypopnea 53.66 -- -- --  42.44 Hypopnea (4%) 41.46 -- -- --    Total Supine Side Prone Upright  Position AHI 54.63 54.55 54.67 0.00 0.00  REM AHI 0.00   NREM AHI 54.63   Position RDI 54.63 54.55 54.67 0.00 0.00  REM RDI 0.00   NREM RDI 54.63    4% Hypopnea Total Supine Side Prone Upright  Position AHI (4%) 42.44 41.82 42.67 0.00 0.00  REM AHI (4%) 0.00   NREM AHI (4%) 42.44   Position RDI (4%) 42.44 41.82 42.67 0.00 0.00  REM RDI (4%) 0.00   NREM RDI (4%) 42.44    CPAP Respiratory Information Apnea Summary Sub Supine Side Prone Upright  Total 168 Total 168 43 125 0 0    REM 1 0 1 0 0    NREM 167 43 124 0 0  Obs 1 REM 0 0 0 0 0    NREM 1 1 0 0 0  Mix 0 REM 0 0 0 0 0    NREM 0 0 0 0 0  Cen 167 REM 1 0 1 0 0    NREM 166 42 124 0 0   Rera Summary Sub Supine Side Prone Upright  Total 0 Total 0 0 0 0 0    REM 0 0 0 0 0    NREM 0 0 0 0 0   Hypopnea Summary Sub Supine Side Prone Upright  Total 84 Total 84 11 73 0 0    REM 20 0 20 0 0    NREM 64 11 53 0 0   4% Hypopnea Summary Sub Supine Side Prone Upright  Total (4%) 71 Total 71 8 63 0 0    REM 13 0 13 0 0    NREM 58 8 50 0 0     AHI Total Obs Mix Cen  98.82 Apnea 65.88 0.39 0.00 65.49   Hypopnea 32.94 -- -- --  93.73 Hypopnea (4%) 27.84 -- -- --    Total Supine Side Prone Upright  Position AHI 98.82 95.29 99.83 0.00 0.00  REM AHI 25.98   NREM AHI 132.63   Position RDI 98.82 95.29 99.83 0.00 0.00  REM RDI 25.98   NREM RDI 132.63    4% Hypopnea Total Supine Side Prone Upright  Position AHI (4%) 93.73 90.00 94.79 0.00 0.00  REM AHI (4%) 17.32   NREM AHI (4%) 129.19   Position RDI (4%) 93.73 90.00 94.79 0.00 0.00  REM RDI (4%) 17.32   NREM RDI (4%) 129.19    Desaturation Information (Baseline)  <100% <90% <80% <70% <60% <50% <40%  Supine 20 0 0 0 0 0 0  Side 68 37 0 0 0 0 0  Prone 0 0 0 0 0 0 0  Upright 0 0 0 0 0 0 0  Total 88 37 0 0  0 0 0   Desaturation threshold setting: 4% Minimum desaturation setting: 10 seconds SaO2 nadir: 84% The longest event was a 65 sec obstructive Hypopneawith a minimum SaO2 of 92%. The lowest SaO2 was 85% associated with a 14 sec obstructive Hypopnea. Awakening/Arousal Information (Baseline) # of Awakenings 19  Wake after sleep onset 17.17m  Wake after persistent sleep 5.8m   Arousal Assoc. Arousals Index  Apneas 2 1.0  Hypopneas 64 31.2  Leg Movements 0 0.0  Snore 0.0 0.0  PTT Arousals 0 0.0  Spontaneous 30 14.6  Total 96 46.8   Desaturation Information (CPAP)  <100% <90% <80% <70% <60% <50% <40%  Supine 50 0 0 0 0 0 0  Side 175 19 0 0 0 0 0  Prone 0 0 0 0 0 0 0  Upright 0 0 0 0 0 0 0  Total 225 19 0 0 0 0 0  Desaturation threshold setting: 4% Minimum desaturation setting: 10 seconds SaO2 nadir: 70% The longest event was a 50 sec obstructive Hypopnea with a minimum SaO2 of 87%. The lowest SaO2 was 87% associated with a 50 sec obstructive Hypopnea. Awakening/Arousal Information (CPAP) # of Awakenings 6  Wake after sleep onset 19.89m  Wake after persistent sleep 17.58m   Arousal Assoc. Arousals Index  Apneas 5 2.0  Hypopneas 6 2.4  Leg Movements 0 0.0  Snore 0.0 0.0  PTT Arousals 0 0.0  Spontaneous 13 5.1  Total 24 9.4     EKG Rates (Baseline) EKG Avg Max Min  Awake 91 105 81  Asleep 92 104 82  EKG Events: Tachycardia Myoclonus Information (Baseline) PLMS LMs Index  Total LMs during PLMS 0 0.0  LMs w/ Microarousals 0 0.0   LM LMs Index  w/ Microarousal 0 0.0  w/ Awakening 0 0.0  w/ Resp Event 0 0.0  Spontaneous 0 0.0  Total 0 0.0   EKG Rates (CPAP) EKG Avg Max Min  Awake 84 105 74  Asleep 82 100 70  EKG Events: Tachycardia Myoclonus Information (CPAP) PLMS LMs Index  Total LMs during PLMS 0 0.0  LMs w/ Microarousals 0 0.0   LM LMs Index  w/ Microarousal 0 0.0  w/ Awakening 0 0.0  w/ Resp Event 0 0.0  Spontaneous 1 0.4  Total 1 0.4

## 2022-08-06 ENCOUNTER — Telehealth: Payer: Self-pay | Admitting: Neurology

## 2022-08-06 NOTE — Telephone Encounter (Signed)
Called the patient and reviewed the sleep study results with her. Reviewed that there was severe sleep apnea and when CPAP was attempted it caused central apnea to worsen. We switched to BiPAP and then BiPAP ST and the apneas were not treated. Based off this study, Dr Vickey Huger is stating that we would not be able to provide a form or treatment for the pt because her medications are what are likely causing her apnea events.  Advised I would confirm if a BiPAP to ASV titration would be recommended to determine if ASV is effective or not.   Pt was appreciative for the information and had no further questions. Baird Lyons RN

## 2022-08-06 NOTE — Telephone Encounter (Signed)
-----   Message from Melvyn Novas, MD sent at 08/02/2022 12:07 PM EDT ----- IMPRESSION: This SPLIT protocol PSG confirmed a diagnosis of severe central sleep apnea at an AHI of 55/h and was titrated under a Evora FFM size XS. The  CPAP was provoking more central apneas and therapy was switched to BiPAP at  13/9 cm water , from there to 16/ 12 cm BiPAP and finally switched to ST 17/13 cm water and ST 10. Titration could not be completed due to time restraints.   At no time during this titration attempt was there a significant reduction in AHI achieved- the AHI rose further under BiPAP and BiPAP ST.    RECOMMENDATIONS: Central Sleep Apnea is strongly related to the use of narcotics/ opiates.  This condition cannot be treated by CPAP or BiPAP .  NP [place]: I will not be able to provide any further care for this patient.

## 2022-08-07 ENCOUNTER — Encounter: Payer: Self-pay | Admitting: Neurology

## 2022-08-07 ENCOUNTER — Other Ambulatory Visit: Payer: Self-pay | Admitting: Medical-Surgical

## 2022-08-07 MED ORDER — OZEMPIC (0.25 OR 0.5 MG/DOSE) 2 MG/3ML ~~LOC~~ SOPN: 0.2500 mg | PEN_INJECTOR | SUBCUTANEOUS | 0 refills | Status: DC

## 2022-08-13 ENCOUNTER — Telehealth: Payer: Self-pay

## 2022-08-13 DIAGNOSIS — G4731 Primary central sleep apnea: Secondary | ICD-10-CM

## 2022-08-13 DIAGNOSIS — G8929 Other chronic pain: Secondary | ICD-10-CM

## 2022-08-13 DIAGNOSIS — R7303 Prediabetes: Secondary | ICD-10-CM

## 2022-08-13 DIAGNOSIS — E782 Mixed hyperlipidemia: Secondary | ICD-10-CM

## 2022-08-13 NOTE — Telephone Encounter (Addendum)
Initiated Prior authorization ZOX:[id number] (0.25 or 0.5 MG/DOSE) 2MG /3ML pen-injectors Via: Covermymeds Case/Key:[id number] Status: denied  as of 08/13/22 Reason:medical criteria not met even with letter of necessity , awaiting fax of denial letter Notified Pt via: Mychart, called pt left detailed vm of denial

## 2022-08-17 ENCOUNTER — Encounter: Payer: Self-pay | Admitting: Medical-Surgical

## 2022-08-18 NOTE — Progress Notes (Unsigned)
PROVIDER NOTE: Information contained herein reflects review and annotations entered in association with encounter. Interpretation of such information and data should be left to medically-trained personnel. Information provided to patient can be located elsewhere in the medical record under "Patient Instructions". Document created using STT-dictation technology, any transcriptional errors that may result from process are unintentional.    Patient: Mary Cruz  Service Category: E/M  Provider: Oswaldo Done, MD  DOB: [birth year]  DOS: 08/19/2022  Referring Provider: Christen Butter, NP  MRN: [id number]  Specialty: Interventional Pain Management  PCP: Christen Butter, NP  Type: Established Patient  Setting: Ambulatory outpatient    Location: Office  Delivery: Face-to-face     HPI  [name], a 47 y.o. year old female, is here today because of her No primary diagnosis found.. [name] primary complain today is No chief complaint on file.  Pertinent problems: Ms. Wehri has Chronic pain syndrome; Chronic musculoskeletal pain; Osteoarthrosis; Presence of functional implant ([employer] Lumbar spinal cord stimulator implant); Chronic low back pain (Bilateral) (R>L) w/o sciatica; Chronic lumbar radicular pain (Right L5 dermatome; Left S1 Dermatome) (Bilateral) (R>L); Chronic neck pain (1ry area of Pain) (Bilateral) (L>R); Chronic cervical radicular pain (C7 Dermatome) (Bilateral) (L>R); Chronic lower extremity pain (Bilateral) (R>L); Lumbar spondylosis; Lumbar facet syndrome (Bilateral) (R>L); Neurogenic pain; Fibromyalgia; Pain in right knee; Chronic upper extremity pain (2ry area of Pain) (Bilateral) (L>R); Chronic hip pain (3ry area of Pain) (Bilateral) (R>L); Closed fracture of scapula, sequela (Right); DDD (degenerative disc disease), cervical; Cervicalgia; Abnormal MRI, shoulder (Right); Spondylosis without myelopathy or radiculopathy, lumbosacral region; DDD (degenerative disc disease),  lumbar; Osteoarthritis involving multiple joints; Chronic low back pain (Bilateral) (R>L) w/ sciatica (Bilateral) (R>L); Chronic lower extremity pain (Right); Lumbosacral radiculopathy at L5 (Right); Dropfoot (Right); Lumbar facet arthropathy; Intractable low back pain; Closed fracture of multiple ribs of right side; Chronic feet pain (Bilateral); Migraines; Chronic hip pain (Right); Osteoarthritis of hip (Right); Grade 1 Retrolisthesis of L4/L5; Spondylolisthesis at L4-L5 level; Lumbosacral radiculopathy/radiculitis at L4 (Right); Lower extremity numbness (Bilateral); Complaints of weakness of lower extremities (Bilateral); and Abnormal MRI, lumbar spine (11/04/2021) on their pertinent problem list. Pain Assessment: Severity of   is reported as a  /10. Location:    / . Onset:  . Quality:  . Timing:  . Modifying factor(s):  [name] Vitals:  vitals were not taken for this visit.  BMI: Estimated body mass index is 46.69 kg/m as calculated from the following:   Height as of 07/15/22: 5\' 4"  (1.626 m).   Weight as of 07/15/22: 272 lb (123.4 kg). Last encounter: 07/15/2022. Last procedure: Visit date not found.  Reason for encounter:  *** . ***  Pharmacotherapy Assessment  Analgesic: Oxycodone IR 5 mg, 1 tab PO q 6 hrs (20 mg/day of oxycodone) MME/day: 30 mg/day.   Monitoring: [place] PMP: PDMP reviewed during this encounter.       Pharmacotherapy: No side-effects or adverse reactions reported. Compliance: No problems identified. Effectiveness: Clinically acceptable.  No notes on file  No results found for: "CBDTHCR" No results found for: "D8THCCBX" No results found for: "D9THCCBX"  UDS:  Summary  Date Value Ref Range Status  10/22/2021 Note  Final    Comment:    ==================================================================== [employer] Select 13 (MW) ==================================================================== Test                             Result       Flag  Units  Drug Present and  Declared for Prescription Verification   Oxycodone                      178          EXPECTED   ng/mg creat   Oxymorphone                    261          EXPECTED   ng/mg creat   Noroxycodone                   1146         EXPECTED   ng/mg creat   Noroxymorphone                 143          EXPECTED   ng/mg creat    Sources of oxycodone are scheduled prescription medications.    Oxymorphone, noroxycodone, and noroxymorphone are expected    metabolites of oxycodone. Oxymorphone is also available as a    scheduled prescription medication.  ==================================================================== Test                      Result    Flag   Units      Ref Range   Creatinine              103              mg/dL      >=16 ==================================================================== Declared Medications:  The flagging and interpretation on this report are based on the  following declared medications.  Unexpected results may arise from  inaccuracies in the declared medications.   **Note: The testing scope of this panel includes these medications:   Oxycodone (Roxicodone)   **Note: The testing scope of this panel does not include the  following reported medications:   Amitriptyline (Elavil)  Benzonatate (Tessalon)  Biotin  Duloxetine (Cymbalta)  Hydrochlorothiazide (Zestoretic)  Hyoscyamine (Levsin)  Levocetirizine (Xyzal)  Lisinopril (Zestoretic)  Methocarbamol (Robaxin)  Pantoprazole (Protonix)  Probiotic  Rizatriptan (Maxalt)  Topiramate (Topamax) ==================================================================== For clinical consultation, please call [phone]. ====================================================================       ROS  Constitutional: Denies any fever or chills Gastrointestinal: No reported hemesis, hematochezia, vomiting, or acute GI distress Musculoskeletal: Denies any acute onset joint swelling, redness, loss of ROM, or  weakness Neurological: No reported episodes of acute onset apraxia, aphasia, dysarthria, agnosia, amnesia, paralysis, loss of coordination, or loss of consciousness  Medication Review  DULoxetine, Fezolinetant, Full Kit Nebulizer Set, Probiotic Product, SUMAtriptan, Semaglutide(0.25 or 0.5MG /DOS), albuterol, benzonatate, budesonide, busPIRone, famotidine, hyoscyamine, levocetirizine, lisinopril-hydrochlorothiazide, meclizine, methocarbamol, naloxone, nortriptyline, ondansetron, oxyCODONE, pantoprazole, and promethazine-dextromethorphan  History Review  Allergy: Ms. Arballo is allergic to celecoxib, doxycycline, gabapentin, latex, nabumetone, pentazocine lactate, seroquel [quetiapine], amoxicillin, bactrim [sulfamethoxazole-trimethoprim], cephalexin, erythromycin, hydromorphone hcl, and lyrica [pregabalin]. Drug: Ms. Colunga  reports no history of drug use. Alcohol:  reports no history of alcohol use. Tobacco:  reports that she quit smoking about 2 years ago. Her smoking use included cigarettes. She quit smokeless tobacco use about 4 years ago. Social: Ms. Ohaire  reports that she quit smoking about 2 years ago. Her smoking use included cigarettes. She quit smokeless tobacco use about 4 years ago. She reports that she does not drink alcohol and does not use drugs. Medical:  has a past medical history of Abscess of axillary fold (10/10/2013), Acute bronchitis with asthma with  acute exacerbation (01/23/2015), Dehydration (04/19/2019), Elevated lactic acid level (04/19/2019), Encounter for management of implanted device (12/19/2014), Encounter for therapeutic drug level monitoring (12/19/2014), Fibromyalgia, GERD (gastroesophageal reflux disease), Hyperglycemia (04/19/2019), Hypertension, Implant pain at battery site (right buttocks area) (04/05/2015), KIDNEY STONES (07/02/2007), Long term current use of opiate analgesic (12/19/2014), Morbid obesity (HCC) (12/19/2014), Muscle cramping (12/19/2014), Opiate  use (30 MME/Day) (12/19/2014), Osteoarthritis, Pain, Right knee pain (01/10/2015), Sepsis (HCC) (04/19/2019), Sepsis affecting skin (10/10/2013), and Tobacco abuse (04/19/2019). Surgical: Ms. Hohl  has a past surgical history that includes spinal cord stimulator; Occipital nerve stimulator insertion; Cholecystectomy (2000); Knee surgery; Shoulder surgery; Tubal ligation (2000); Endometrial ablation; Spinal cord stimulator implant (2008); Knee surgery (Right, 02/2015); Breast surgery; Spinal cord stimulator insertion (N/A, 06/21/2016); Shoulder arthroscopy with distal clavicle resection (Right, 10/29/2018); Subacromial decompression (Right, 10/29/2018); laparoscopic appendectomy (Right, 01/01/2020); Appendectomy; Ankle surgery; Colonoscopy with esophagogastroduodenoscopy (egd); Colonoscopy (N/A, 02/20/2021); and Esophagogastroduodenoscopy (N/A, 02/20/2021). Family: family history includes Asthma in her mother; Depression in her mother; Fibromyalgia in an other family member; Stroke in an other family member; Thyroid disease in an other family member.  Laboratory Chemistry Profile   Renal Lab Results  Component Value Date   BUN 15 05/26/2022   CREATININE 0.99 05/26/2022   LABCREA 51 11/20/2020   BCR 18 05/21/2022   GFRAA 84 08/03/2020   GFRNONAA >60 05/26/2022    Hepatic Lab Results  Component Value Date   AST 17 05/21/2022   ALT 19 05/21/2022   ALBUMIN 3.4 (L) 10/13/2021   ALKPHOS 88 10/13/2021   LIPASE 24 10/13/2021    Electrolytes Lab Results  Component Value Date   NA 135 05/26/2022   K 3.4 (L) 05/26/2022   CL 102 05/26/2022   CALCIUM 8.6 (L) 05/26/2022   MG 2.0 04/20/2019   PHOS 3.2 04/20/2019    Bone Lab Results  Component Value Date   VD25OH 21 (L) 07/23/2021    Inflammation (CRP: Acute Phase) (ESR: Chronic Phase) Lab Results  Component Value Date   CRP 1.0 (H) 04/05/2015   ESRSEDRATE 28 (H) 11/20/2020   LATICACIDVEN 1.8 10/13/2021         Note: Above Lab results  reviewed.  Recent Imaging Review  Split night study Dohmeier, Porfirio Mylar, MD     08/02/2022 12:03 PM [facility] Split Summary    [facility] SPLIT NIGHT INTERPRETATION REPORT   STUDY DATE: 07/14/2022    PATIENT NAME:  JAILI VANCLEEF         DATE OF BIRTH:  06/06/1975  PATIENT ID:  [id number]    TYPE OF STUDY:  SPLIT  READING PHYSICIAN: Melvyn Novas,  SCORING TECHNICIAN: Sheena  Fields   INDICATIONS:  This 47 year-old Female endorsed the Epworth  Sleepiness Scale at 17 out of 24 (scores above or equal to 10 are  suggestive of hypersomnolence).  DESCRIPTION: A sleep technologist was in attendance for the  duration of the recording.  Data collection, scoring, video  monitoring, and reporting were performed in compliance with the  AASM Manual for the Scoring of Sleep and Associated Events;  (Hypopnea is scored based on the criteria listed in Section VIII  D. 1b in the AASM Manual V2.6 using a 4% oxygen desaturation rule  or Hypopnea is scored based on the criteria listed in Section  VIII D. 1a in the AASM Manual V2.6 using 3% oxygen desaturation  and /or arousal rule).  A physician certified by the [place] [employer] [place] [employer] of  Sleep Medicine reviewed each epoch of the study.  ADDITIONAL INFORMATION:  Height: 64.0 in Weight: 272 lb (BMI 46)  Neck Size: 17.0 ".  Medications: Proventil, Ventolin, Tessalon, Pulmicort, Buspar,  Cymbalta, Pepcid, Veozah, Levsin SL, Xyzal, Zestoretic, Antivert,  Robaxin, Narcan, Pamelor, Zofran, Oxycodone, Protonix,  Prednisone, Probiotic, Promethazine, Imitrex  TOTAL STUDY DETAILS: Lights off was at 22:18: and lights on  05:05: (408 minutes hours in bed). This study was performed with  an initial diagnostic portion followed by positive airway  pressure titration.  Part 1 DIAGNOSTIC ANALYSIS   SLEEP CONTINUITY AND SLEEP ARCHITECTURE:  The diagnostic portion  of the study began at  22:18 and ended at 02:00, for a recording  time of 3h 42.63m minutes.  Total sleep time was 123 minutes  (26.8% supine;  73.2% lateral;  0.0% prone, 0.0% REM sleep), with  a decreased sleep efficiency at 55.4%. Sleep latency was  increased at 82.0 minutes.  Of the total sleep time, the percentage of stage N1 sleep was  9.3%, stage N2 sleep was 56.1%, stage N3 sleep was 34.6%, and REM  sleep was 0.0%. There were 0 Stage R periods observed during this  portion of the study, 19 awakenings (i.e. transitions to Stage W  from any sleep stage), and 62.0 total stage transitions. Wake  after sleep onset (WASO) time accounted for 17 minutes.   BODY POSITION: Duration of total sleep and percent of total sleep  in their respective position is as follows: supine 33 minutes  (26.8%), non-supine 90.0 minutes (73.2%); right 65 minutes  (53.3%), left 24 minutes (19.9%), and prone 00 minutes (0.0%).  Total supine REM sleep time was 00 minutes (0.0% of total REM  sleep).      RESPIRATORY MONITORING:  Based on CMS criteria (using a 4% oxygen  desaturation rule for scoring hypopneas), there were 170 apneas  (3 obstructive; 167 central; 0 mixed), and 156 hypopneas.   Apnea index was 1.0. Hypopnea index was 41.5.  The apnea-hypopnea index was 42.4 overall (11.2 supine; 0.0 REM,  0.0 supine REM). There were 0 respiratory effort-related arousals  (RERAs).  The RERA index was 0.0 events/hr.   Based on AASM criteria (using a 3% oxygen desaturation and /or  arousal rule for scoring hypopneas), there were 170 apneas (3  obstructive; 167 central; 0 mixed), and 156 hypopneas.   Apnea index was 1.0. Hypopnea index was 53.7.  The apnea-hypopnea index was 54.6 overall (14.6 supine; 0.0 REM,  0.0 supine REM). There were 0 respiratory effort-related arousals  (RERAs).  Moderate to loud snoring was noted.    OXIMETRY: Total sleep time spent at, or below 88% was 2.0  minutes, or 1.6% of total sleep time.   Respiratory events were associated with oxyhemoglobin  desaturations (nadir 84%) from a normal baseline (mean 95%).  Total time spent at, or below 88% was 3.5 minutes, or 2.9%  of  total sleep time. Cheyne Stokes breathing.   LIMB MOVEMENTS: There were 0 periodic limb movements of sleep  (0.0/hr), of which 0 (0.0/hr) were associated with an arousal.    EKG: Analysis of electrocardiogram activity showed the highest  heart rate for the baseline portion of the study was 105.0 beats  per minute.  The average heart rate during sleep was 92 bpm,  while the lowest  heart rate for the same period was 82 bpm.   AROUSAL (Baseline):  Arousal index was 96 /hr. Of these, 66.0  were identified as respiratory-related arousals (32.2 /hr), 0  were PLM-related arousals (0.0 /hr), and 30 were non-specific  arousals (14.6 /hr)    PART 2 TREATMENT ANALYSIS  SLEEP CONTINUITY AND SLEEP ARCHITECTURE:  The treatment portion  of the study began at 02:00 and ended at 05:05, for a recording  time of 3 h 6.108m minutes.    The patient received an Evora FX mask in [place] and was titrated  to CPAP at first, than to BiPAP and finally BiPAP ST without  improvement of apnea.   Total sleep time was 153 minutes (22.2% supine; 77.8% lateral;  0.0% prone, now with 31.7% REM sleep), with a decreased sleep  efficiency at 82.3%. Sleep latency was normal at 14.0 minutes.  REM sleep latency was decreased at 49.5 minutes.   Of the total sleep time, the percentage of stage N1 sleep was  2.9%, stage N3 sleep was 19.6%, and REM sleep was 31.7%. There  were 1 Stage R periods observed during this portion of the study,  6 awakenings (i.e. transitions to Stage W from any sleep stage),  and 19.0 total stage transitions. Wake after sleep onset (WASO)  time accounted for 19 minutes.    RESPIRATORY MONITORING:    While on PAP therapy, based on CMS criteria, the apnea-hypopnea  index was 93.7 overall (20.0 supine; 5.5 REM).    While on PAP therapy, based on AASM criteria, the apnea-hypopnea  index was 98.8 overall (21.2 supine; 8.2 REM).    BODY POSITION: Duration of total sleep and percent of total sleep  in their respective position is as follows: supine 34 minutes  (22.2%), non-supine 119.0 minutes (77.8%); right 00 minutes  (0.0%), left 119 minutes (77.8%), and prone 00 minutes (0.0%).  Total supine REM sleep time was 00 minutes (0.0% of total REM  sleep).  LIMB MOVEMENTS: There were 0 periodic limb movements of sleep  (0.0/h), of which 0 (0.0/h) were associated with an arousal.   OXIMETRY: Total sleep time spent at, or below 88% was 0.7  minutes, or 0.4% of total sleep time. Snoring was classified as .  Respiratory events were associated with oxyhemoglobin  desaturation (nadir 87.0%) from a mean of 97.0%.  Total time  spent at, or below 88% was 5.5 minutes, or 3.6% of total sleep  time.  Snoring was improved.   CARDIAC:  Analysis of electrocardiogram activity showed the highest heart  rate for the treatment portion of the study was 105.0 beats per  minute.  The average heart rate during sleep was 82 bpm, while  the highest heart rate for the same period was 100 bpm.  AROUSAL: Arousal index was 9.4 /hr. There were 24 arousals in  total, of  these, 11.0 were identified as respiratory-related  arousals (4.3 /hr), 0 were PLM-related arousals (0.0 /hr), and 13  were non-specific arousals (5.1 /hr)     IMPRESSION: This SPLIT protocol PSG confirmed a diagnosis of  severe central sleep apnea at an AHI of 55/h and was titrated  under a Evora FFM size XS. The  CPAP was provoking more central apneas and therapy was switched  to BiPAP at  13/9 cm water , from there to 16/ 12 cm BiPAP and  finally switched to ST 17/13 cm water and ST 10. Titration could  not be completed due to time restraints.   At no time during this titration attempt was there a significant  reduction in AHI achieved- the AHI rose  further under BiPAP and  BiPAP ST.   RECOMMENDATIONS: Central Sleep Apnea  is strongly related to the  use of narcotics/ opiates.  This condition cannot be treated by CPAP or BiPAP .  I will not be able to provide any further care for this patient    Melvyn Novas, MD     [facility] CPAP/Bilevel Report    General Information  Name: Kendralyn, Coots BMI: 16 Physician: ,   ID: [id number] Height: 64 in Technician: Margaretann Loveless  Sex: Female Weight: 272 lb Record: [id number]  Age: 11 [04/06/1975] Date: 07/14/2022 Scorer: Zenon Mayo Fields   Recommended Settings IPAP: N/A cmH20 EPAP: N/A cmH2O AHI: N/A AHI  (4%): N/A              Pressure IPAP/EPAP 00 04 / 00 05 07 09 13 / 09 14 / 10 15 / 11   O2 Vol 0.0 0.0 0.0 0.0 0.0 0.0 0.0 0.0  Time TRT 226.56m 1.1m 17.75m 11.17m 14.53m 26.71m 16.62m 17.44m   TST 123.7m 0.74m 7.46m 10.22m 14.21m 22.64m 16.59m 16.11m  Sleep Stage % Wake 14.6 100.0 55.9 9.1 0.0 15.1 0.0 5.7   % REM 0.0 0.0 0.0 0.0 0.0 44.4 100.0 100.0   % N1 9.3 0.0 33.3 0.0 0.0 4.4 0.0 0.0   % N2 56.1 0.0 66.7 100.0 100.0 44.4 0.0 0.0   % N3 34.6 0.0 0.0 0.0 0.0 6.7 0.0 0.0  Respiratory Total Events 112 0 4 9 36 27 8 7    Obs. Apn. 2 0 0 1 0 0 0 0   Mixed Apn. 0 0 0 0 0 0 0 0   Cen. Apn. 0 0 0 5 33 16 0 1   Hypopneas 110 0 4 3 3 11 8 6    AHI 54.63 0.00 32.00 54.00 154.29 72.00 29.09 25.45   Supine AHI 54.55 0.00 32.00 54.00 154.29 120.00 0.00 0.00   Prone AHI 0.00 0.00 0.00 0.00 0.00 0.00 0.00 0.00   Side AHI 54.67 0.00 0.00 0.00 0.00 66.00 29.09 25.45  Respiratory (4%) Hypopneas (4%) 85.00 0.00 1.00 3.00 3.00 9.00  3.00 5.00   AHI (4%) 42.44 0.00 8.00 54.00 154.29 66.67 10.91 21.82   Supine AHI (4%) 41.82 0.00 8.00 54.00 154.29 120.00 0.00 0.00   Prone AHI (4%) 0.00 0.00 0.00 0.00 0.00 0.00 0.00 0.00   Side AHI (4%) 42.67 0.00 0.00 0.00 0.00 60.00 10.91 21.82  Desat Profile <= 90% 13.61m 0.58m 0.59m 0.45m 0.48m 2.74m 1.35m 0.62m   <= 80% 5.99m 0.54m  0.29m 0.68m 0.22m 0.84m 0.25m 0.7m   <= 70% 5.73m 0.39m 0.75m 0.49m 0.13m 0.84m 0.12m 0.37m   <= 60% 5.13m 0.87m 0.37m 0.25m 0.55m 0.70m 0.57m 0.42m  Arousal Index Apnea 1.0 0.0 0.0 0.0 0.0 5.3 0.0 0.0   Hypopnea 31.2 0.0 8.0 0.0 0.0 5.3 7.3 0.0   LM 0.0 0.0 0.0 0.0 0.0 0.0 0.0 0.0   Spontaneous 14.6 0.0 48.0 18.0 4.3 0.0 0.0 3.6               Pressure IPAP/EPAP 16 / 12 16 / 12 ST 17 / 13 ST   O2 Vol 0.0 0.0 0.0  Time TRT 34.77m 6.29m 38.35m   TST 22.34m 6.24m 38.107m  Sleep Stage % Wake 36.2 0.0 0.0   % REM 25.0 0.0 0.0   % N1 4.5 0.0 0.0   % N2 70.5 100.0 25.0   % N3 0.0 0.0 75.0  Respiratory AASM Total Events 40 17 104   Obs. Apn. 0 0 0  Mixed Apn. 0 0 0   Cen. Apn. 38 16 58   Hypopneas 2 1 46   AHI 109.09 170.00 164.21   Supine AHI 0.00 0.00 0.00   Prone AHI 0.00 0.00 0.00   Side AHI 109.09 170.00 164.21  Respiratory (4%) Hypopneas (4%) 2.00 1.00 44.00   AHI (4%) 109.09 170.00 161.05   Supine AHI (4%) 0.00 0.00 0.00   Prone AHI (4%) 0.00 0.00 0.00   Side AHI (4%) 109.09 170.00 161.05  Desat Profile <= 90% 2.48m 0.28m 0.33m   <= 80% 0.32m 0.59m 0.28m   <= 70% 0.55m 0.33m 0.31m   <= 60% 0.40m 0.69m 0.29m  Arousal Index Apnea 8.2 0.0 0.0   Hypopnea 2.7 0.0 0.0   LM 0.0 0.0 0.0   Spontaneous 5.5 0.0 0.0   General Information  Name: Jenika, Lovvorn BMI: 46.69 Physician: Melvyn Novas, MD  ID: [id number] Height: 64.0 in Technician: Margaretann Loveless, RPSGT  Sex: Female Weight: 272.0 lb Record: [id number]  Age: 73 [1975/10/12] Date: 07/14/2022     Medical & Medication History    BRENTLEE CHERN is a 47 year old female, seen in request by her  primary care [facility] nurse practitioner Christen Butter,  for evaluation of bilateral hands, feet paresthesia, initial  evaluation was on February 05, 2022  to Dr Terrace Arabia.   I reviewed and summarized the referring note. PMHx Depression,  anxiety HTN Kidney stone. Chronic low back pain, under pain  management, s/p spinal cord stimulator placement in May  2018,  oxycodone 5mg  x4 tabs a day prn. Chronic migraine Quit smoke  since Nov 2023. She had long history of headaches, getting worse  over the past few years, despite taking Topamax 50 mg every  night, she has migraine once or twice each week, lateralized  severe pounding headache with light noise sensitivity, Maxalt 10  mg as needed was helpful, but she has to take second dose 50% of  the time, sleep always helps. Also history of numbness tingling,  involving all 5 fingers on both hands, to the point she dropped  things from her hands, sometimes woke her up from sleep, have to  shake her hands to make the sensation come back  Proventil, Ventolin, Tessalon, Pulmicort, Buspar, Cymbalta,  Pepcid, Veozah, Levsin SL, Xyzal, Zestoretic, Antivert, Robaxin,  Narcan, Pamelor, Zofran, Oxycodone, Protonix, Prednisone,  Probiotic, Promethazine, Imitrex   Sleep Disorder      Comments   The patient came into the lab for a SPLIT night study. The  patient was split due to having an AHI greater than 10 after 2  hours of total sleep time per MD's order. The patient took  Oxycodone, Cymbalta, Robaxin, Buspar, Amitriptyline, and  Nortriptyline prior to start of sleep study. The patient was  fitted with a ResMed N20 (nasal) size small. CPAP was started at  [facility] with no EPR. CPAP was increased to 9cmH2O with EPR of 1.  The patient started having CSA. The patient was switched to BiPAP  13/9 cmH2O. Nasal interface started leaking once the patient was  switched to BiPAP. Tech switched interface to F&P Evora (FFM)  size XS. The mask was a good fit to face, and the patient  tolerate the mask well. BiPAP was titrated up to 16/12 cmH2O. The  patient was still having CSA and hypopneas. Patient was switched  to BiPAP ST 17/13 BUR of 10 due to having straight CSA. Tech  wanted to see if BiPAP ST would improve CSA. Running  out of time  to titrate patient. The patient had one restroom break. EKG kept  in NSR.  Moderate to loud snoring. All sleep stages witnessed.  Respiratory events scored with a 3% desat. Slept lateral and  supine. The patient did not need oxygen. AHI was 55.1 after 2 hrs  of TST.    Baseline Sleep Stage Information Baseline start time: 10:18:18 PM Baseline end time: 02:00:12 AM   Time Total Supine Side Prone Upright  Recording 3h 42.21m 2h 0.47m 1h 41.71m 0h 0.76m 0h 0.69m  Sleep 2h 3.9m 0h 33.51m 1h 30.58m 0h 0.51m 0h 0.91m   Latency N1 N2 N3 REM Onset Per. Slp. Eff.  Actual 1h 22.39m 1h 26.23m 2h 9.8m 0h 0.39m 1h 22.63m 1h 50.81m 55.41%   Stg Dur Wake N1 N2 N3 REM  Total 17.0 11.5 69.0 42.5 0.0  Supine 5.5 4.5 28.5 0.0 0.0  Side 11.5 7.0 40.5 42.5 0.0  Prone 0.0 0.0 0.0 0.0 0.0  Upright 0.0 0.0 0.0 0.0 0.0   Stg % Wake N1 N2 N3 REM  Total 12.1 9.3 56.1 34.6 0.0  Supine 3.9 3.7 23.2 0.0 0.0  Side 8.2 5.7 32.9 34.6 0.0  Prone 0.0 0.0 0.0 0.0 0.0  Upright 0.0 0.0 0.0 0.0 0.0    CPAP Sleep Stage Information CPAP start time: 02:00:12 AM CPAP end time: 05:05:57 AM   Time Total Supine Side Prone Upright  Recording (TRT) 3h 6.40m 0h 58.73m 2h 8.60m 0h 0.28m 0h 0.60m  Sleep (TST) 2h 33.41m 0h 34.86m 1h 59.78m 0h 0.80m 0h 0.78m   Latency N1 N2 N3 REM Onset Per. Slp. Eff.  Actual 0h 14.54m 0h 16.63m 0h 48.5m 0h 49.43m 0h 14.23m 0h 25.49m  82.26%   Stg Dur Wake N1 N2 N3 REM  Total 33.0 4.5 70.0 30.0 48.5  Supine 24.0 2.5 30.0 1.5 0.0  Side 9.0 2.0 40.0 28.5 48.5  Prone 0.0 0.0 0.0 0.0 0.0  Upright 0.0 0.0 0.0 0.0 0.0   Stg % Wake N1 N2 N3 REM  Total 17.7 2.9 45.8 19.6 31.7  Supine 12.9 1.6 19.6 1.0 0.0  Side 4.8 1.3 26.1 18.6 31.7  Prone 0.0 0.0 0.0 0.0 0.0  Upright 0.0 0.0 0.0 0.0 0.0    Baseline Respiratory Information Apnea Summary Sub Supine Side Prone Upright  Total 2 Total 2 0 2 0 0    REM 0 0 0 0 0    NREM 2 0 2 0 0  Obs 2 REM 0 0 0 0 0    NREM 2 0 2 0 0  Mix 0 REM 0 0 0 0 0    NREM 0 0 0 0 0  Cen 0 REM 0 0 0 0 0    NREM 0 0 0 0 0   Rera Summary Sub Supine Side Prone  Upright  Total 0 Total 0 0 0 0 0    REM 0 0 0 0 0    NREM 0 0 0 0 0   Hypopnea Summary Sub Supine Side Prone Upright  Total 110 Total 110 30 80 0 0    REM 0 0 0 0 0    NREM 110 30 80 0 0   4% Hypopnea Summary Sub Supine Side Prone Upright  Total (4%) 85 Total 85 23 62 0 0    REM 0 0 0 0 0    NREM 85 23 62 0 0     AHI Total Obs Mix Cen  54.63  Apnea 0.98 0.98 0.00 0.00   Hypopnea 53.66 -- -- --  42.44 Hypopnea (4%) 41.46 -- -- --    Total Supine Side Prone Upright  Position AHI 54.63 54.55 54.67 0.00 0.00  REM AHI 0.00   NREM AHI 54.63   Position RDI 54.63 54.55 54.67 0.00 0.00  REM RDI 0.00   NREM RDI 54.63    4% Hypopnea Total Supine Side Prone Upright  Position AHI (4%) 42.44 41.82 42.67 0.00 0.00  REM AHI (4%) 0.00   NREM AHI (4%) 42.44   Position RDI (4%) 42.44 41.82 42.67 0.00 0.00  REM RDI (4%) 0.00   NREM RDI (4%) 42.44    CPAP Respiratory Information Apnea Summary Sub Supine Side Prone Upright  Total 168 Total 168 43 125 0 0    REM 1 0 1 0 0    NREM 167 43 124 0 0  Obs 1 REM 0 0 0 0 0    NREM 1 1 0 0 0  Mix 0 REM 0 0 0 0 0    NREM 0 0 0 0 0  Cen 167 REM 1 0 1 0 0    NREM 166 42 124 0 0   Rera Summary Sub Supine Side Prone Upright  Total 0 Total 0 0 0 0 0    REM 0 0 0 0 0    NREM 0 0 0 0 0   Hypopnea Summary Sub Supine Side Prone Upright  Total 84 Total 84 11 73 0 0    REM 20 0 20 0 0    NREM 64 11 53 0 0   4% Hypopnea Summary Sub Supine Side Prone Upright  Total (4%) 71 Total 71 8 63 0 0    REM 13 0 13 0 0    NREM 58 8 50 0 0     AHI Total Obs Mix Cen  98.82 Apnea 65.88 0.39 0.00 65.49   Hypopnea 32.94 -- -- --  93.73 Hypopnea (4%) 27.84 -- -- --    Total Supine Side Prone Upright  Position AHI 98.82 95.29 99.83 0.00 0.00  REM AHI 25.98   NREM AHI 132.63   Position RDI 98.82 95.29 99.83 0.00 0.00  REM RDI 25.98   NREM RDI 132.63    4% Hypopnea Total Supine Side Prone Upright  Position AHI (4%) 93.73 90.00 94.79 0.00 0.00  REM AHI  (4%) 17.32   NREM AHI (4%) 129.19   Position RDI (4%) 93.73 90.00 94.79 0.00 0.00  REM RDI (4%) 17.32   NREM RDI (4%) 129.19    Desaturation Information (Baseline)  <100% <90% <80% <70% <60% <50% <40%  Supine 20 0 0 0 0 0 0  Side 68 37 0 0 0 0 0  Prone 0 0 0 0 0 0 0  Upright 0 0 0 0 0 0 0  Total 88 37 0 0 0 0 0  Desaturation threshold setting: 4% Minimum desaturation setting: 10 seconds SaO2 nadir: 84% The longest event was a 65 sec obstructive Hypopneawith a minimum  SaO2 of 92%. The lowest SaO2 was 85% associated with a 14 sec obstructive  Hypopnea. Awakening/Arousal Information (Baseline) # of Awakenings 19  Wake after sleep onset 17.12m  Wake after persistent sleep 5.75m   Arousal Assoc. Arousals Index  Apneas 2 1.0  Hypopneas 64 31.2  Leg Movements 0 0.0  Snore 0.0 0.0  PTT Arousals 0 0.0  Spontaneous 30 14.6  Total 96 46.8  Desaturation Information (CPAP)  <100% <90% <80% <70% <60% <50% <40%  Supine 50 0 0 0 0 0 0  Side 175 19 0 0 0 0 0  Prone 0 0 0 0 0 0 0  Upright 0 0 0 0 0 0 0  Total 225 19 0 0 0 0 0  Desaturation threshold setting: 4% Minimum desaturation setting: 10 seconds SaO2 nadir: 70% The longest event was a 50 sec obstructive Hypopnea with a  minimum SaO2 of 87%. The lowest SaO2 was 87% associated with a 50 sec obstructive  Hypopnea. Awakening/Arousal Information (CPAP) # of Awakenings 6  Wake after sleep onset 19.54m  Wake after persistent sleep 17.33m   Arousal Assoc. Arousals Index  Apneas 5 2.0  Hypopneas 6 2.4  Leg Movements 0 0.0  Snore 0.0 0.0  PTT Arousals 0 0.0  Spontaneous 13 5.1  Total 24 9.4     EKG Rates (Baseline) EKG Avg Max Min  Awake 91 105 81  Asleep 92 104 82  EKG Events: Tachycardia Myoclonus Information (Baseline) PLMS LMs Index  Total LMs during PLMS 0 0.0  LMs w/ Microarousals 0 0.0   LM LMs Index  w/ Microarousal 0 0.0  w/ Awakening 0 0.0  w/ Resp Event 0 0.0  Spontaneous 0 0.0  Total 0 0.0   EKG Rates  (CPAP) EKG Avg Max Min  Awake 84 105 74  Asleep 82 100 70  EKG Events: Tachycardia Myoclonus Information (CPAP) PLMS LMs Index  Total LMs during PLMS 0 0.0  LMs w/ Microarousals 0 0.0   LM LMs Index  w/ Microarousal 0 0.0  w/ Awakening 0 0.0  w/ Resp Event 0 0.0  Spontaneous 1 0.4  Total 1 0.4    Note: Reviewed        Physical Exam  General appearance: Well nourished, well developed, and well hydrated. In no apparent acute distress Mental status: Alert, oriented x 3 (person, place, & time)       Respiratory: No evidence of acute respiratory distress Eyes: PERLA Vitals: There were no vitals taken for this visit. BMI: Estimated body mass index is 46.69 kg/m as calculated from the following:   Height as of 07/15/22: 5\' 4"  (1.626 m).   Weight as of 07/15/22: 272 lb (123.4 kg). Ideal: Patient weight not recorded  Assessment   Diagnosis Status  No diagnosis found. Controlled Controlled Controlled   Updated Problems: No problems updated.  Plan of Care  Problem-specific:  No problem-specific Assessment & Plan notes found for this encounter.  Ms. BETHZAIDA BARGIEL has a current medication list which includes the following long-term medication(s): albuterol, albuterol, budesonide, duloxetine, famotidine, hyoscyamine, levocetirizine, lisinopril-hydrochlorothiazide, methocarbamol, oxycodone, oxycodone, [START ON 09/08/2022] oxycodone, [START ON 10/08/2022] oxycodone, pantoprazole, and sumatriptan.  Pharmacotherapy (Medications Ordered): No orders of the defined types were placed in this encounter.  Orders:  No orders of the defined types were placed in this encounter.  Follow-up plan:   No follow-ups on file.      Interventional Therapies  Risk Factors  Considerations:   Latex allergy    Planned  Pending:   Diagnostic right hip x-rays and MRI (06/13/2021) Diagnostic/therapeutic right IA hip joint inj. #1    Under consideration:   Diagnostic/therapeutic right IA hip  joint inj. #1  Diagnostic right suprascapular NB #1    Completed:   Palliative bilateral lumbar facet MBB x2 (11/16/2020) (90/90/80/85-90)  Therapeutic left lumbar facet MBB x2 (10/10/2016) (100/100/100/100)  Therapeutic right lumbar facet MBB  x1 (08/31/2015) (n/a)  Therapeutic right lumbar facet RFA x3 (05/08/2021) (100/100/50/50) on the right side Therapeutic left lumbar facet RFA x3 (03/22/2021) (100/100/99/99) on left side Palliative left C7-T1 CESI x1 (04/13/2015) (n/a)    Therapeutic  Palliative (PRN) options:   Palliative lumbar facet MBB  Palliative lumbar facet RFA  Palliative C7-T1 CESI  PRN SCS programming adjustments (2018 replacement by Dr. Odette Fraction)   Pharmacotherapy  Nonopioids transferred 04/06/2020: Robaxin        Recent Visits Date Type Provider Dept  07/15/22 Office Visit Delano Metz, MD [facility]  Showing recent visits within past 90 days and meeting all other requirements Future Appointments Date Type Provider Dept  08/19/22 Appointment Delano Metz, MD [facility]  11/04/22 Appointment Delano Metz, MD [facility]  Showing future appointments within next 90 days and meeting all other requirements  I discussed the assessment and treatment plan with the patient. The patient was provided an opportunity to ask questions and all were answered. The patient agreed with the plan and demonstrated an understanding of the instructions.  Patient advised to call back or seek an in-person evaluation if the symptoms or condition worsens.  Duration of encounter: *** minutes.  Total time on encounter, as per AMA guidelines included both the face-to-face and non-face-to-face time personally spent by the physician and/or other qualified health care professional(s) on the day of the encounter (includes time in activities that require the physician or other qualified health care professional and does not include time in  activities normally performed by clinical staff). Physician's time may include the following activities when performed: Preparing to see the patient (e.g., pre-charting review of records, searching for previously ordered imaging, lab work, and nerve conduction tests) Review of prior analgesic pharmacotherapies. Reviewing PMP Interpreting ordered tests (e.g., lab work, imaging, nerve conduction tests) Performing post-procedure evaluations, including interpretation of diagnostic procedures Obtaining and/or reviewing separately obtained history Performing a medically appropriate examination and/or evaluation Counseling and educating the patient/family/caregiver Ordering medications, tests, or procedures Referring and communicating with other health care professionals (when not separately reported) Documenting clinical information in the electronic or other health record Independently interpreting results (not separately reported) and communicating results to the patient/ family/caregiver Care coordination (not separately reported)  Note by: Oswaldo Done, MD Date: 08/19/2022; Time: 8:55 AM

## 2022-08-19 ENCOUNTER — Encounter: Payer: Self-pay | Admitting: Pain Medicine

## 2022-08-19 ENCOUNTER — Ambulatory Visit: Payer: BC Managed Care – PPO | Attending: Pain Medicine | Admitting: Pain Medicine

## 2022-08-19 VITALS — BP 135/88 | HR 105 | Temp 97.2°F | Ht 65.0 in | Wt 270.0 lb

## 2022-08-19 DIAGNOSIS — G894 Chronic pain syndrome: Secondary | ICD-10-CM | POA: Insufficient documentation

## 2022-08-19 DIAGNOSIS — G4731 Primary central sleep apnea: Secondary | ICD-10-CM | POA: Diagnosis not present

## 2022-08-19 DIAGNOSIS — M47816 Spondylosis without myelopathy or radiculopathy, lumbar region: Secondary | ICD-10-CM | POA: Insufficient documentation

## 2022-08-19 DIAGNOSIS — M25551 Pain in right hip: Secondary | ICD-10-CM | POA: Insufficient documentation

## 2022-08-19 DIAGNOSIS — Z9189 Other specified personal risk factors, not elsewhere classified: Secondary | ICD-10-CM | POA: Insufficient documentation

## 2022-08-19 DIAGNOSIS — G8929 Other chronic pain: Secondary | ICD-10-CM | POA: Insufficient documentation

## 2022-08-19 DIAGNOSIS — Z79899 Other long term (current) drug therapy: Secondary | ICD-10-CM | POA: Insufficient documentation

## 2022-08-19 DIAGNOSIS — M25552 Pain in left hip: Secondary | ICD-10-CM | POA: Insufficient documentation

## 2022-08-19 DIAGNOSIS — M79601 Pain in right arm: Secondary | ICD-10-CM | POA: Diagnosis not present

## 2022-08-19 DIAGNOSIS — Z79891 Long term (current) use of opiate analgesic: Secondary | ICD-10-CM | POA: Insufficient documentation

## 2022-08-19 DIAGNOSIS — M542 Cervicalgia: Secondary | ICD-10-CM | POA: Insufficient documentation

## 2022-08-19 DIAGNOSIS — M5459 Other low back pain: Secondary | ICD-10-CM | POA: Insufficient documentation

## 2022-08-19 DIAGNOSIS — M79602 Pain in left arm: Secondary | ICD-10-CM | POA: Insufficient documentation

## 2022-08-19 HISTORY — DX: Long term (current) use of opiate analgesic: Z79.891

## 2022-08-19 MED ORDER — MOUNJARO 2.5 MG/0.5ML ~~LOC~~ SOAJ
2.5000 mg | SUBCUTANEOUS | 0 refills | Status: DC
Start: 2022-08-19 — End: 2022-10-23

## 2022-08-19 MED ORDER — NALOXONE HCL 4 MG/0.1ML NA LIQD
1.0000 | NASAL | 0 refills | Status: DC | PRN
Start: 2022-08-19 — End: 2023-12-04

## 2022-08-19 MED ORDER — BUPRENORPHINE 5 MCG/HR TD PTWK
1.0000 | MEDICATED_PATCH | TRANSDERMAL | 0 refills | Status: DC
Start: 2022-09-23 — End: 2022-10-24

## 2022-08-19 MED ORDER — OXYCODONE HCL 5 MG PO TABS
5.0000 mg | ORAL_TABLET | Freq: Every day | ORAL | 0 refills | Status: DC
Start: 2022-09-02 — End: 2022-10-13

## 2022-08-19 MED ORDER — OXYCODONE HCL 5 MG PO TABS
5.0000 mg | ORAL_TABLET | Freq: Two times a day (BID) | ORAL | 0 refills | Status: DC
Start: 2022-08-26 — End: 2022-10-13

## 2022-08-19 MED ORDER — OXYCODONE HCL 5 MG PO TABS
5.0000 mg | ORAL_TABLET | Freq: Three times a day (TID) | ORAL | 0 refills | Status: DC
Start: 2022-08-19 — End: 2022-10-13

## 2022-08-19 NOTE — Patient Instructions (Addendum)
____________________________________________________________________________________________  Drug Holidays  What is a "Drug Holiday"? Drug Holiday: is the name given to the process of slowly tapering down and temporarily stopping the pain medication for the purpose of decreasing or eliminating tolerance to the drug.  Benefits Improved effectiveness Decreased required effective dose Improved pain control End dependence on high dose therapy Decrease cost of therapy Uncovering "opioid-induced hyperalgesia". (OIH)  What is "opioid hyperalgesia"? It is a paradoxical increase in pain caused by exposure to opioids. Stopping the opioid pain medication, contrary to the expected, it actually decreases or completely eliminates the pain. Ref.: "A comprehensive review of opioid-induced hyperalgesia". Donney Rankins, et.al. Pain Physician. 2011 Mar-Apr;14(2):145-61.  What is tolerance? Tolerance: the progressive loss of effectiveness of a pain medicine due to repetitive use. A common problem of opioid pain medications.  How long should a "Drug Holiday" last? Effectiveness depends on the patient staying off all opioid pain medicines for a minimum of 14 consecutive days. (2 weeks)  How about just taking less of the medicine? Does not work. Will not accomplish goal of eliminating the excess receptors.  How about switching to a different pain medicine? (AKA. "Opioid rotation") Does not work. Creates the illusion of effectiveness by taking advantage of inaccurate equivalent dose calculations between different opioids. -This "technique" was promoted by studies funded by [employer], such as [employer], creators of "OxyContin".  Can I stop the medicine "cold [place]"? We do not recommend it. You should always coordinate with your prescribing physician to make the transition as smoothly as possible. Avoid stopping the medicine abruptly without consulting. We recommend a "slow taper".  What  is a slow taper? Taper: refers to the gradual decrease in dose.   How do I stop/taper the dose? Slowly. Decrease the daily amount of pills that you take by one (1) pill every seven (7) days. This is called a "slow downward taper". Example: if you normally take four (4) pills per day, drop it to three (3) pills per day for seven (7) days, then to two (2) pills per day for seven (7) days, then to one (1) per day for seven (7) days, and then stop the medicine. The 14 day "Drug Holiday" starts on the first day without medicine.   Will I experience withdrawals? Unlikely with a slow taper.  What triggers withdrawals? Withdrawals are triggered by the sudden/abrupt stop of high dose opioids. Withdrawals can be avoided by slowly decreasing the dose over a prolonged period of time.  What are withdrawals? Symptoms associated with sudden/abrupt reduction/stopping of high-dose, long-term use of pain medication. Withdrawal are seldom seen on low dose therapy, or patients rarely taking opioid medication.  Early Withdrawal Symptoms may include: Agitation Anxiety Muscle aches Increased tearing Insomnia Runny nose Sweating Yawning  Late symptoms may include: Abdominal cramping Diarrhea Dilated pupils Goose bumps Nausea Vomiting  When could I see withdrawals? Onset: 8-24 hours after last use for most opioids. 12-48 hours for long-acting opioids (i.e.: methadone)  How long could they last? Duration: 4-10 days for most opioids. 14-21 days for long-acting opioids (i.e.: methadone)  What will happen after I complete my "Drug Holiday"? The need and indications for the opioid analgesic will be reviewed before restarting the medication. Dose requirements will likely decrease and the dose will need to be adjusted accordingly.   (Last update: 05/01/2022) ___________________________________________________________________________________________      ____________________________________________________________________________________________  Opioid Pain Medication Update  To: All patients taking opioid pain medications. (I.e.: hydrocodone, hydromorphone, oxycodone, oxymorphone, morphine, codeine, methadone, tapentadol,  tramadol, buprenorphine, fentanyl, etc.)  Re: Updated review of side effects and adverse reactions of opioid analgesics, as well as new information about long term effects of this class of medications.  Direct risks of long-term opioid therapy are not limited to opioid addiction and overdose. Potential medical risks include serious fractures, breathing problems during sleep, hyperalgesia, immunosuppression, chronic constipation, bowel obstruction, myocardial infarction, and tooth decay secondary to xerostomia.  Unpredictable adverse effects that can occur even if you take your medication correctly: Cognitive impairment, respiratory depression, and death. Most people think that if they take their medication "correctly", and "as instructed", that they will be safe. Nothing could be farther from the truth. In reality, a significant amount of recorded deaths associated with the use of opioids has occurred in individuals that had taken the medication for a long time, and were taking their medication correctly. The following are examples of how this can happen: Patient taking his/her medication for a long time, as instructed, without any side effects, is given a certain antibiotic or another unrelated medication, which in turn triggers a "Drug-to-drug interaction" leading to disorientation, cognitive impairment, impaired reflexes, respiratory depression or an untoward event leading to serious bodily harm or injury, including death.  Patient taking his/her medication for a long time, as instructed, without any side effects, develops an acute impairment of liver and/or kidney function. This will lead to a rapid inability of the body to  breakdown and eliminate their pain medication, which will result in effects similar to an "overdose", but with the same medicine and dose that they had always taken. This again may lead to disorientation, cognitive impairment, impaired reflexes, respiratory depression or an untoward event leading to serious bodily harm or injury, including death.  A similar problem will occur with patients as they grow older and their liver and kidney function begins to decrease as part of the aging process.  Background information: Historically, the original case for using long-term opioid therapy to treat chronic noncancer pain was based on safety assumptions that subsequent experience has called into question. In 1996, the American Pain Society and the American Academy of Pain Medicine issued a consensus statement supporting long-term opioid therapy. This statement acknowledged the dangers of opioid prescribing but concluded that the risk for addiction was low; respiratory depression induced by opioids was short-lived, occurred mainly in opioid-naive patients, and was antagonized by pain; tolerance was not a common problem; and efforts to control diversion should not constrain opioid prescribing. This has now proven to be wrong. Experience regarding the risks for opioid addiction, misuse, and overdose in community practice has failed to support these assumptions.  According to the Centers for Disease Control and Prevention, fatal overdoses involving opioid analgesics have increased sharply over the past decade. Currently, more than 96,700 people die from drug overdoses every year. Opioids are a factor in 7 out of every 10 overdose deaths. Deaths from drug overdose have surpassed motor vehicle accidents as the leading cause of death for individuals between the ages of 32 and 103.  Clinical data suggest that neuroendocrine dysfunction may be very common in both men and women, potentially causing hypogonadism, erectile  dysfunction, infertility, decreased libido, osteoporosis, and depression. Recent studies linked higher opioid dose to increased opioid-related mortality. Controlled observational studies reported that long-term opioid therapy may be associated with increased risk for cardiovascular events. Subsequent meta-analysis concluded that the safety of long-term opioid therapy in elderly patients has not been proven.   Side Effects and adverse reactions: Common side  effects: Drowsiness (sedation). Dizziness. Nausea and vomiting. Constipation. Physical dependence -- Dependence often manifests with withdrawal symptoms when opioids are discontinued or decreased. Tolerance -- As you take repeated doses of opioids, you require increased medication to experience the same effect of pain relief. Respiratory depression -- This can occur in healthy people, especially with higher doses. However, people with COPD, asthma or other lung conditions may be even more susceptible to fatal respiratory impairment.  Uncommon side effects: An increased sensitivity to feeling pain and extreme response to pain (hyperalgesia). Chronic use of opioids can lead to this. Delayed gastric emptying (the process by which the contents of your stomach are moved into your small intestine). Muscle rigidity. Immune system and hormonal dysfunction. Quick, involuntary muscle jerks (myoclonus). Arrhythmia. Itchy skin (pruritus). Dry mouth (xerostomia).  Long-term side effects: Chronic constipation. Sleep-disordered breathing (SDB). Increased risk of bone fractures. Hypothalamic-pituitary-adrenal dysregulation. Increased risk of overdose.  RISKS: Respiratory depression and death: Opioids increase the risk of respiratory depression and death.  Drug-to-drug interactions: Opioids are relatively contraindicated in combination with benzodiazepines, sleep inducers, and other central nervous system depressants. Other classes of medications  (i.e.: certain antibiotics and even over-the-counter medications) may also trigger or induce respiratory depression in some patients.  Medical conditions: Patients with pre-existing respiratory problems are at higher risk of respiratory failure and/or depression when in combination with opioid analgesics. Opioids are relatively contraindicated in some medical conditions such as central sleep apnea.   Fractures and Falls:  Opioids increase the risk and incidence of falls. This is of particular importance in elderly patients.  Endocrine System:  Long-term administration is associated with endocrine abnormalities (endocrinopathies). (Also known as Opioid-induced Endocrinopathy) Influences on both the hypothalamic-pituitary-adrenal axis?and the hypothalamic-pituitary-gonadal axis have been demonstrated with consequent hypogonadism and adrenal insufficiency in both sexes. Hypogonadism and decreased levels of dehydroepiandrosterone sulfate have been reported in men and women. Endocrine effects include: Amenorrhoea in women (abnormal absence of menstruation) Reduced libido in both sexes Decreased sexual function Erectile dysfunction in men Hypogonadisms (decreased testicular function with shrinkage of testicles) Infertility Depression and fatigue Loss of muscle mass Anxiety Depression Immune suppression Hyperalgesia Weight gain Anemia Osteoporosis Patients (particularly women of childbearing age) should avoid opioids. There is insufficient evidence to recommend routine monitoring of asymptomatic patients taking opioids in the long-term for hormonal deficiencies.  Immune System: Human studies have demonstrated that opioids have an immunomodulating effect. These effects are mediated via opioid receptors both on immune effector cells and in the central nervous system. Opioids have been demonstrated to have adverse effects on antimicrobial response and anti-tumour surveillance. Buprenorphine has  been demonstrated to have no impact on immune function.  Opioid Induced Hyperalgesia: Human studies have demonstrated that prolonged use of opioids can lead to a state of abnormal pain sensitivity, sometimes called opioid induced hyperalgesia (OIH). Opioid induced hyperalgesia is not usually seen in the absence of tolerance to opioid analgesia. Clinically, hyperalgesia may be diagnosed if the patient on long-term opioid therapy presents with increased pain. This might be qualitatively and anatomically distinct from pain related to disease progression or to breakthrough pain resulting from development of opioid tolerance. Pain associated with hyperalgesia tends to be more diffuse than the pre-existing pain and less defined in quality. Management of opioid induced hyperalgesia requires opioid dose reduction.  Cancer: Chronic opioid therapy has been associated with an increased risk of cancer among noncancer patients with chronic pain. This association was more evident in chronic strong opioid users. Chronic opioid consumption causes significant pathological changes  in the small intestine and colon. Epidemiological studies have found that there is a link between opium dependence and initiation of gastrointestinal cancers. Cancer is the second leading cause of death after cardiovascular disease. Chronic use of opioids can cause multiple conditions such as GERD, immunosuppression and renal damage as well as carcinogenic effects, which are associated with the incidence of cancers.   Mortality: Long-term opioid use has been associated with increased mortality among patients with chronic non-cancer pain (CNCP).  Prescription of long-acting opioids for chronic noncancer pain was associated with a significantly increased risk of all-cause mortality, including deaths from causes other than overdose.  Reference: Von Korff M, Kolodny A, Deyo RA, Chou R. Long-term opioid therapy reconsidered. Ann Intern Med. 2011  Sep 6;155(5):325-8. doi: 10.7326/[id number]. PMID: 09811914; PMCID: [id number]. Randon Goldsmith, Hayward RA, Dunn KM, [place] KP. Risk of adverse events in patients prescribed long-term opioids: A cohort study in the [place] [facility] [employer]. Eur J Pain. 2019 May;23(5):908-922. doi: 10.1002/ejp.1357. Epub 2019 Jan 31. PMID: 86578469. Colameco S, Coren JS, Ciervo CA. Continuous opioid treatment for chronic noncancer pain: a time for moderation in prescribing. Postgrad Med. 2009 Jul;121(4):61-6. doi: 10.3810/pgm.2009.07.2032. PMID: 62952841. William Hamburger RN, Alliance SD, Blazina I, Cristopher [place], Bougatsos C, Deyo RA. The effectiveness and risks of long-term opioid therapy for chronic pain: a systematic review for a [employer] to [employer]. Ann Intern Med. 2015 Feb 17;162(4):276-86. doi: 10.7326/M14-2559. PMID: 32440102. Caryl Bis [facility], [place] DM. NCHS Data Brief No. 22. [place]: Centers for Disease Control and Prevention; 2009. Sep, Increase in Fatal Poisonings Involving Opioid Analgesics in the [place], 1999-2006. Song IA, Choi HR, Oh TK. Long-term opioid use and mortality in patients with chronic non-cancer pain: Ten-year follow-up study in [place] from 2010 through 2019. EClinicalMedicine. 2022 Jul 18;51:101558. doi: 10.1016/j.eclinm.2022.725366. PMID: 44034742; PMCID: [id number]. Huser, W., Schubert, T., Vogelmann, T. et al. All-cause mortality in patients with long-term opioid therapy compared with non-opioid analgesics for chronic non-cancer pain: a database study. BMC Med 18, 162 (2020). http://lester.info/ Rashidian H, Karie Kirks, Malekzadeh R, Haghdoost AA. An Ecological Study of the Association between Opiate Use and Incidence of Cancers. Addict Health. 2016 Fall;8(4):252-260. PMID: 33295188; PMCID: [id number].  Our Goal: Our goal is to control your  pain with means other than the use of opioid pain medications.  Our Recommendation: Talk to your physician about coming off of these medications. We can assist you with the tapering down and stopping these medicines. Based on the new information, even if you cannot completely stop the medication, a decrease in the dose may be associated with a lesser risk. Ask for other means of controlling the pain. Decrease or eliminate those factors that significantly contribute to your pain such as smoking, obesity, and a diet heavily tilted towards "inflammatory" nutrients.  Last Updated: 08/19/2022   ____________________________________________________________________________________________      ____________________________________________________________________________________________  Naloxone Nasal Spray  Why am I receiving this medication? [place] STOP ACT requires that all patients taking high dose opioids or at risk of opioids respiratory depression, be prescribed an opioid reversal agent, such as Naloxone (AKA: Narcan).  What is this medication? NALOXONE (nal OX one) treats opioid overdose, which causes slow or shallow breathing, severe drowsiness, or trouble staying awake. Call emergency services after using this medication. You may need additional treatment. Naloxone works by reversing the effects of opioids. It belongs to a group of medications called opioid  blockers.  COMMON BRAND NAME(S): Kloxxado, Narcan  What should I tell my care team before I take this medication? They need to know if you have any of these conditions: Heart disease Substance use disorder An unusual or allergic reaction to naloxone, other medications, foods, dyes, or preservatives Pregnant or trying to get pregnant Breast-feeding  When to use this medication? This medication is to be used for the treatment of respiratory depression (less than 8 breaths per minute) secondary to opioid overdose.   How  to use this medication? This medication is for use in the nose. Lay the person on their back. Support their neck with your hand and allow the head to tilt back before giving the medication. The nasal spray should be given into 1 nostril. After giving the medication, move the person onto their side. Do not remove or test the nasal spray until ready to use. Get emergency medical help right away after giving the first dose of this medication, even if the person wakes up. You should be familiar with how to recognize the signs and symptoms of a narcotic overdose. If more doses are needed, give the additional dose in the other nostril. Talk to your care team about the use of this medication in children. While this medication may be prescribed for children as young as newborns for selected conditions, precautions do apply.  Naloxone Overdosage: If you think you have taken too much of this medicine contact a poison control center or emergency room at once.  NOTE: This medicine is only for you. Do not share this medicine with others.  What if I miss a dose? This does not apply.  What may interact with this medication? This is only used during an emergency. No interactions are expected during emergency use. This list may not describe all possible interactions. Give your health care provider a list of all the medicines, herbs, non-prescription drugs, or dietary supplements you use. Also tell them if you smoke, drink alcohol, or use illegal drugs. Some items may interact with your medicine.  What should I watch for while using this medication? Keep this medication ready for use in the case of an opioid overdose. Make sure that you have the phone number of your care team and local hospital ready. You may need to have additional doses of this medication. Each nasal spray contains a single dose. Some emergencies may require additional doses. After use, bring the treated person to the nearest hospital or call 911.  Make sure the treating care team knows that the person has received a dose of this medication. You will receive additional instructions on what to do during and after use of this medication before an emergency occurs.  What side effects may I notice from receiving this medication? Side effects that you should report to your care team as soon as possible: Allergic reactions--skin rash, itching, hives, swelling of the face, lips, tongue, or throat Side effects that usually do not require medical attention (report these to your care team if they continue or are bothersome): Constipation Dryness or irritation inside the nose Headache Increase in blood pressure Muscle spasms Stuffy nose Toothache This list may not describe all possible side effects. Call your doctor for medical advice about side effects. You may report side effects to FDA at 1-800-FDA-1088.  Where should I keep my medication? Because this is an emergency medication, you should keep it with you at all times.  Keep out of the reach of children and pets. Store  between 20 and 25 degrees C (68 and 77 degrees F). Do not freeze. Throw away any unused medication after the expiration date. Keep in original box until ready to use.  NOTE: This sheet is a summary. It may not cover all possible information. If you have questions about this medicine, talk to your doctor, pharmacist, or health care provider.   2023 Elsevier/Gold Standard (2020-10-06 00:00:00)  ____________________________________________________________________________________________  ____________________________________________________________________________________________  WARNING: CBD (cannabidiol) & Delta (Delta-8 tetrahydrocannabinol) products.   Applicable to:  All individuals currently taking or considering taking CBD (cannabidiol) and, more important, all patients taking opioid analgesic controlled substances (pain medication). (Example: oxycodone;  oxymorphone; hydrocodone; hydromorphone; morphine; methadone; tramadol; tapentadol; fentanyl; buprenorphine; butorphanol; dextromethorphan; meperidine; codeine; etc.)  Introduction:  Recently there has been a drive towards the use of "natural" products for the treatment of different conditions, including pain anxiety and sleep disorders. Marijuana and hemp are two varieties of the cannabis genus plants. Marijuana and its derivatives are illegal, while hemp and its derivatives are not. Cannabidiol (CBD) and tetrahydrocannabinol (THC), are two natural compounds found in plants of the Cannabis genus. They can both be extracted from hemp or marijuana. Both compounds interact with your body's endocannabinoid system in very different ways. CBD is associated with pain relief (analgesia) while THC is associated with the psychoactive effects ("the high") obtained from the use of marijuana products. There are two main types of THC: Delta-9, which comes from the marijuana plant and it is illegal, and Delta-8, which comes from the hemp plant, and it is legal. (Both, Delta-9-THC and Delta-8-THC are psychoactive and give you "the high".)   Legality:  Marijuana and its derivatives: illegal Hemp and its derivatives: Legal (State dependent) UPDATE: (03/30/2021) The Drug Enforcement Agency (DEA) issued a letter stating that "delta" cannabinoids, including Delta-8-THCO and Delta-9-THCO, synthetically derived from hemp do not qualify as hemp and will be viewed as Schedule I drugs. (Schedule I drugs, substances, or chemicals are defined as drugs with no currently accepted medical use and a high potential for abuse. Some examples of Schedule I drugs are: heroin, lysergic acid diethylamide (LSD), marijuana (cannabis), 3,4-methylenedioxymethamphetamine (ecstasy), methaqualone, and peyote.) (CueTune.com.ee)  Legal status of CBD in [place]:  "Conditionally Legal"  Reference: "FDA Regulation of Cannabis and Cannabis-Derived  Products, Including Cannabidiol (CBD)" - [employer]dk  Warning:  CBD is not FDA approved and has not undergo the same manufacturing controls as prescription drugs.  This means that the purity and safety of available CBD may be questionable. Most of the time, despite manufacturer's claims, it is contaminated with THC (delta-9-tetrahydrocannabinol - the chemical in marijuana responsible for the "HIGH").  When this is the case, the [facility] contaminant will trigger a positive urine drug screen (UDS) test for Marijuana (carboxy-THC).   The FDA recently put out a warning about 5 things that everyone should be aware of regarding Delta-8 THC: Delta-8 THC products have not been evaluated or approved by the FDA for safe use and may be marketed in ways that put the public health at risk. The FDA has received adverse event reports involving delta-8 THC-containing products. Delta-8 THC has psychoactive and intoxicating effects. Delta-8 THC manufacturing often involve use of potentially harmful chemicals to create the concentrations of delta-8 THC claimed in the marketplace. The final delta-8 THC product may have potentially harmful by-products (contaminants) due to the chemicals used in the process. Manufacturing of delta-8 THC products may occur in uncontrolled or unsanitary settings, which may lead to the presence of unsafe contaminants or  other potentially harmful substances. Delta-8 THC products should be kept out of the reach of children and pets.  NOTE: Because a positive UDS for any illicit substance is a violation of our medication agreement, your opioid analgesics (pain medicine) may be permanently discontinued.  MORE ABOUT CBD  General Information: CBD was discovered in [birth year] and it is a derivative of the cannabis sativa genus plants (Marijuana and Hemp). It is one of the 113 identified  substances found in Marijuana. It accounts for up to 40% of the plant's extract. As of 2018, preliminary clinical studies on CBD included research for the treatment of anxiety, movement disorders, and pain. CBD is available and consumed in multiple forms, including inhalation of smoke or vapor, as an aerosol spray, and by mouth. It may be supplied as an oil containing CBD, capsules, dried cannabis, or as a liquid solution. CBD is thought not to be as psychoactive as THC (delta-9-tetrahydrocannabinol - the chemical in marijuana responsible for the "HIGH"). Studies suggest that CBD may interact with different biological target receptors in the body, including cannabinoid and other neurotransmitter receptors. As of 2018 the mechanism of action for its biological effects has not been determined.  Side-effects  Adverse reactions: Dry mouth, diarrhea, decreased appetite, fatigue, drowsiness, malaise, weakness, sleep disturbances, and others.  Drug interactions:  CBD may interact with medications such as blood-thinners. CBD causes drowsiness on its own and it will increase drowsiness caused by other medications, including antihistamines (such as Benadryl), benzodiazepines (Xanax, Ativan, Valium), antipsychotics, antidepressants, opioids, alcohol and supplements such as kava, melatonin and St. John's Wort.  Other drug interactions: Brivaracetam (Briviact); Caffeine; Carbamazepine (Tegretol); Citalopram (Celexa); Clobazam (Onfi); Eslicarbazepine (Aptiom); Everolimus (Zostress); Lithium; Methadone (Dolophine); Rufinamide (Banzel); Sedative medications (CNS depressants); Sirolimus (Rapamune); Stiripentol (Diacomit); Tacrolimus (Prograf); Tamoxifen ; Soltamox); Topiramate (Topamax); Valproate; Warfarin (Coumadin); Zonisamide. (Last update: 01/21/2022) ____________________________________________________________________________________________

## 2022-08-19 NOTE — Progress Notes (Signed)
Nursing Pain Medication Assessment:  Safety precautions to be maintained throughout the outpatient stay will include: orient to surroundings, keep bed in low position, maintain call bell within reach at all times, provide assistance with transfer out of bed and ambulation.  Medication Inspection Compliance: Pill count conducted under aseptic conditions, in front of the patient. Neither the pills nor the bottle was removed from the patient's sight at any time. Once count was completed pills were immediately returned to the patient in their original bottle.  Medication: Oxycodone IR Pill/Patch Count:  20 of 120 pills remain Pill/Patch Appearance: Markings consistent with prescribed medication Bottle Appearance: Standard pharmacy container. Clearly labeled. Filled Date: 5 / 21 / 2024 Last Medication intake:  Yesterday

## 2022-08-19 NOTE — Telephone Encounter (Signed)
Ozempic discontinued.  We will try to submit for [facility] or Zepbound 2.5 mg weekly to start.  Please submit prior authorization and include letter that was completed previously. ___________________________________________ Thayer Ohm, DNP, APRN, FNP-BC Primary Care and Sports Medicine [facility] [place]

## 2022-08-19 NOTE — Addendum Note (Signed)
Addended byChristen Butter on: 08/19/2022 03:02 PM   Modules accepted: Orders

## 2022-08-20 ENCOUNTER — Telehealth: Payer: Self-pay

## 2022-08-20 NOTE — Telephone Encounter (Signed)
Initiated Prior authorization [id number] 2.5MG /0.5ML pen-injectors Via: Covermymeds Case/Key:[id number] Status: Pending as of 08/20/22 Reason: Notified Pt via: Mychart

## 2022-08-23 ENCOUNTER — Encounter: Payer: Self-pay | Admitting: Medical-Surgical

## 2022-08-23 DIAGNOSIS — Z1231 Encounter for screening mammogram for malignant neoplasm of breast: Secondary | ICD-10-CM

## 2022-08-26 ENCOUNTER — Other Ambulatory Visit: Payer: Self-pay | Admitting: Medical-Surgical

## 2022-08-26 ENCOUNTER — Encounter: Payer: Self-pay | Admitting: Medical-Surgical

## 2022-08-26 DIAGNOSIS — I10 Essential (primary) hypertension: Secondary | ICD-10-CM

## 2022-09-02 ENCOUNTER — Encounter: Payer: Self-pay | Admitting: Medical-Surgical

## 2022-09-02 ENCOUNTER — Ambulatory Visit: Payer: BC Managed Care – PPO | Admitting: Medical-Surgical

## 2022-09-02 VITALS — BP 117/77 | HR 94 | Temp 97.9°F | Resp 20 | Ht 65.0 in | Wt 281.1 lb

## 2022-09-02 DIAGNOSIS — J019 Acute sinusitis, unspecified: Secondary | ICD-10-CM | POA: Diagnosis not present

## 2022-09-02 DIAGNOSIS — B9689 Other specified bacterial agents as the cause of diseases classified elsewhere: Secondary | ICD-10-CM

## 2022-09-02 MED ORDER — AZITHROMYCIN 250 MG PO TABS: ORAL_TABLET | ORAL | 0 refills | Status: AC

## 2022-09-02 NOTE — Progress Notes (Signed)
        Established patient visit  History, exam, impression, and plan:  1. Acute bacterial sinusitis Pleasant 47 year old female presenting with complaints of several days of left sided facial pain across the forehead, behind the eye, affecting the cheek and the left nostril/nare. Has had some sinus congestion and pressure. Now having dental pain. Started having chills this morning but no documented fevers. Left ear pressure and discomfort noted today. No cough, sore throat, epistaxis, rhinorrhea, CP, or SOB. On exam, tenderness across the frontal and maxillary sinuses. Left nasal turbinates swollen and red. Small polyp in the left nare, nonobstructive. Plan to treat with Azithromycin due to multiple antibiotic intolerances. If no improvement in symptoms, may need to consider imaging for further evaluation.   Procedures performed this visit: None.  Return if symptoms worsen or fail to improve.  __________________________________ Thayer Ohm, DNP, APRN, FNP-BC Primary Care and Sports Medicine [facility] [place]

## 2022-09-03 ENCOUNTER — Other Ambulatory Visit: Payer: Self-pay | Admitting: Medical-Surgical

## 2022-09-05 ENCOUNTER — Other Ambulatory Visit: Payer: Self-pay | Admitting: Medical-Surgical

## 2022-09-05 ENCOUNTER — Encounter: Payer: Self-pay | Admitting: Medical-Surgical

## 2022-09-06 MED ORDER — BUSPIRONE HCL 5 MG PO TABS: 5.0000 mg | ORAL_TABLET | Freq: Two times a day (BID) | ORAL | 1 refills | Status: DC | PRN

## 2022-09-11 ENCOUNTER — Ambulatory Visit: Payer: BC Managed Care – PPO

## 2022-09-24 ENCOUNTER — Ambulatory Visit: Payer: BC Managed Care – PPO | Admitting: Medical-Surgical

## 2022-09-26 DIAGNOSIS — R61 Generalized hyperhidrosis: Secondary | ICD-10-CM | POA: Diagnosis not present

## 2022-09-26 DIAGNOSIS — R42 Dizziness and giddiness: Secondary | ICD-10-CM | POA: Diagnosis not present

## 2022-09-26 DIAGNOSIS — R531 Weakness: Secondary | ICD-10-CM | POA: Diagnosis not present

## 2022-09-26 DIAGNOSIS — R1084 Generalized abdominal pain: Secondary | ICD-10-CM | POA: Diagnosis not present

## 2022-09-27 ENCOUNTER — Ambulatory Visit: Payer: BC Managed Care – PPO | Admitting: Medical-Surgical

## 2022-09-27 DIAGNOSIS — R9431 Abnormal electrocardiogram [ECG] [EKG]: Secondary | ICD-10-CM | POA: Diagnosis not present

## 2022-09-27 DIAGNOSIS — R1033 Periumbilical pain: Secondary | ICD-10-CM | POA: Diagnosis not present

## 2022-09-27 DIAGNOSIS — I10 Essential (primary) hypertension: Secondary | ICD-10-CM | POA: Diagnosis not present

## 2022-09-27 DIAGNOSIS — K529 Noninfective gastroenteritis and colitis, unspecified: Secondary | ICD-10-CM | POA: Diagnosis not present

## 2022-09-27 DIAGNOSIS — R109 Unspecified abdominal pain: Secondary | ICD-10-CM | POA: Diagnosis not present

## 2022-09-30 ENCOUNTER — Telehealth: Payer: Self-pay | Admitting: General Practice

## 2022-09-30 NOTE — Transitions of Care (Post Inpatient/ED Visit) (Signed)
09/30/2022  Name: Mary Cruz MRN: [id number] DOB: [birth year]  Today's TOC FU Call Status: Today's TOC FU Call Status:: Successful TOC FU Call Completed TOC FU Call Complete Date: 09/30/22  Transition Care Management Follow-up Telephone Call Date of Discharge: 09/27/22 Discharge Facility: Other ([facility] Facility) Name of Other ([facility]) Discharge Facility: [facility] hospital Type of Discharge: Emergency Department Reason for ED Visit: Other: (Abdominal pain) Any questions or concerns?: No  Items Reviewed: Did you receive and understand the discharge instructions provided?: Yes Medications obtained,verified, and reconciled?: Yes (Medications Reviewed) Any new allergies since your discharge?: No Dietary orders reviewed?: NA Do you have support at home?: Yes People in Home: spouse  Medications Reviewed Today: Medications Reviewed Today     Reviewed by Modesto Charon, RN (Registered Nurse) on 09/30/22 at [phone]  Med List Status: <None>   Medication Order Taking? Sig Documenting Provider Last Dose Status Informant  albuterol (PROVENTIL) (2.5 MG/3ML) 0.083% nebulizer solution [id number] No Take 3 mLs (2.5 mg total) by nebulization every 6 (six) hours as needed for wheezing or shortness of breath. Christen Butter, NP Taking Active   albuterol (VENTOLIN HFA) 108 (90 Base) MCG/ACT inhaler [id number] No Inhale 2 puffs into the lungs every 6 (six) hours as needed for wheezing or shortness of breath. Kirby Crigler, Mir M, MD Taking Active   benzonatate (TESSALON) 200 MG capsule [id number] No Take 1 capsule (200 mg total) by mouth 3 (three) times daily as needed for cough. Christen Butter, NP Taking Active   budesonide (PULMICORT) 0.25 MG/2ML nebulizer solution [id number]  USE 2 ML(0.25 MG) VIA NEBULIZER IN THE MORNING AND AT BEDTIME Christen Butter, NP  Active   buprenorphine (BUTRANS) 5 MCG/HR PTWK [id number] No Place 1 patch onto the skin once a week for 28 days. Delano Metz, MD Taking Active    busPIRone (BUSPAR) 5 MG tablet [id number]  Take 1-3 tablets (5-15 mg total) by mouth 2 (two) times daily as needed. Christen Butter, NP  Active   DULoxetine (CYMBALTA) 60 MG capsule [id number]  Take by mouth. [provider]  Active   famotidine (PEPCID) 20 MG tablet [id number] No Take 1 tablet (20 mg total) by mouth 2 (two) times daily. Christen Butter, NP Taking Active Self  Fezolinetant (VEOZAH) 45 MG TABS [id number] No Take 1 tablet (45 mg total) by mouth daily. Christen Butter, NP Taking Active   hyoscyamine (LEVSIN SL) 0.125 MG SL tablet [id number] No Place 1 tablet (0.125 mg total) under the tongue daily as needed.  Patient taking differently: Place 0.125 mg under the tongue daily as needed for cramping.   Christen Butter, NP Taking Active Self  levocetirizine (XYZAL) 5 MG tablet [id number] No Take 1 tablet (5 mg total) by mouth every evening. Christen Butter, NP Taking Active   lisinopril-hydrochlorothiazide (ZESTORETIC) 10-12.5 MG tablet [id number] No TAKE 1 TABLET BY MOUTH DAILY Christen Butter, NP Taking Active   meclizine (ANTIVERT) 25 MG tablet [id number] No TAKE 1 TABLET(25 MG) BY MOUTH THREE TIMES DAILY AS NEEDED FOR DIZZINESS OR NAUSEA Christen Butter, NP Taking Active Self  methocarbamol (ROBAXIN) 750 MG tablet [id number] No Take 1 tablet (750 mg total) by mouth every 8 (eight) hours as needed for muscle spasms. Christen Butter, NP Taking Active Self  naloxone [facility]) nasal spray 4 mg/0.1 mL [id number] No Place 1 spray into the nose as needed for up to 365 doses (for opioid-induced respiratory depresssion). In case of emergency (overdose), spray once into each nostril. If no response within 3 minutes, repeat application and  call 911. Delano Metz, MD Taking Active   ondansetron [facility]) 4 MG tablet [id number] No Take 1 tablet (4 mg total) by mouth every 8 (eight) hours as needed for nausea or vomiting. Levert Feinstein, MD Taking Active Self  oxyCODONE (OXY IR/ROXICODONE) 5 MG immediate release tablet [id number] No  Take 1 tablet (5 mg total) by mouth every 6 (six) hours as needed for severe pain. Must last 30 days Delano Metz, MD Taking Active   oxyCODONE (OXY IR/ROXICODONE) 5 MG immediate release tablet [id number] No Take 1 tablet (5 mg total) by mouth daily for 7 days. Max: 1/day. Must last 7 days. Delano Metz, MD Taking Expired 09/09/22 2359   oxyCODONE (OXY IR/ROXICODONE) 5 MG immediate release tablet [id number] No Take 1 tablet (5 mg total) by mouth 2 (two) times daily for 7 days. Max: 2/day. Must last 7 days. Delano Metz, MD Taking Expired 09/02/22 2359   oxyCODONE (OXY IR/ROXICODONE) 5 MG immediate release tablet [id number]  Take 1 tablet (5 mg total) by mouth 3 (three) times daily for 7 days. Max: 3/day. Must last 7 days. Delano Metz, MD  Expired 08/26/22 2359   pantoprazole (PROTONIX) 40 MG tablet [id number] No TAKE 1 TABLET(40 MG) BY MOUTH DAILY Christen Butter, NP Taking Active Self  Probiotic Product (ALIGN PO) [id number] No Take 1 tablet by mouth daily. [provider] Taking Active Self  promethazine-dextromethorphan (PROMETHAZINE-DM) 6.25-15 MG/5ML syrup [id number] No Take 5 mLs by mouth 4 (four) times daily as needed for cough. Christen Butter, NP Taking Active   Respiratory Therapy Supplies (FULL KIT NEBULIZER SET) MISC [id number] No Nebulizer machine of patient's choice, please include tubes and hoses as needed.  Use as directed. Christen Butter, NP Taking Active   SUMAtriptan 6 MG/0.5ML Ivory Broad [id number] No Inject 6 mg into the skin every 2 (two) hours as needed. Take at headache onset May repeat in 2 hours if headache persists or recurs. Max twice daily. Anson Fret, MD Taking Active   tirzepatide [facility]) 2.5 MG/0.5ML Pen [id number] No Inject 2.5 mg into the skin once a week. Christen Butter, NP Taking Active   Med List Note Concepcion Elk, [place] 08/28/22 [id number]): 08/19/22 Olegario Messier called Pharmacy to cancel narcotics per Dr. Laban Emperor instruction.  MR 09/09/22 UDS 10-22-21 08-23-22 PA  request for Butrans patch faxed to [employer]. DW   Buprenorphine approved through 02-21-23            Home Care and Equipment/Supplies: Were Home Health Services Ordered?: NA Any new equipment or medical supplies ordered?: NA  Functional Questionnaire: Do you need assistance with bathing/showering or dressing?: No Do you need assistance with meal preparation?: No Do you need assistance with eating?: No Do you have difficulty maintaining continence: No Do you need assistance with getting out of bed/getting out of a chair/moving?: No Do you have difficulty managing or taking your medications?: No  Follow up appointments reviewed: PCP Follow-up appointment confirmed?: Yes Date of PCP follow-up appointment?: 10/04/22 Follow-up Provider: Christen Butter NP [facility] Follow-up appointment confirmed?: NA Do you need transportation to your follow-up appointment?: No Do you understand care options if your condition(s) worsen?: Yes-patient verbalized understanding  SDOH Interventions Today    Flowsheet Row Most Recent Value  SDOH Interventions   Transportation Interventions Intervention Not Indicated       SIGNATURE Modesto Charon, RN BSN Nurse Health Advisor

## 2022-10-04 ENCOUNTER — Encounter: Payer: Self-pay | Admitting: Medical-Surgical

## 2022-10-04 ENCOUNTER — Ambulatory Visit: Payer: BC Managed Care – PPO | Admitting: Medical-Surgical

## 2022-10-04 VITALS — BP 111/72 | HR 97 | Ht 64.0 in | Wt 274.1 lb

## 2022-10-04 DIAGNOSIS — G2581 Restless legs syndrome: Secondary | ICD-10-CM

## 2022-10-04 DIAGNOSIS — R55 Syncope and collapse: Secondary | ICD-10-CM

## 2022-10-04 DIAGNOSIS — Z09 Encounter for follow-up examination after completed treatment for conditions other than malignant neoplasm: Secondary | ICD-10-CM | POA: Diagnosis not present

## 2022-10-04 DIAGNOSIS — R42 Dizziness and giddiness: Secondary | ICD-10-CM

## 2022-10-04 NOTE — Progress Notes (Unsigned)
        Established patient visit  History, exam, impression, and plan:  Primary female hypogonadism Up-to-date testosterone labs.  Due for his next testosterone injection.  Doing well on his current regimen with stable vitals.  Testosterone cypionate 100 mg IM given x 1 in the office.  Due for next shot in 2 weeks.  Lump of skin of lower extremity, left Noted a lump on the left posterior calf approximately 1 month ago.  Initially, had redness, tenderness, swelling at the area however this has fully resolved.  No longer has any residual symptoms but the lump has remained.  His wife noticed it and urged him to be seen to evaluate it.  History notable for varicose veins, compliant with recommendations for compression socks daily.  No recent injury or trauma to the area.  Unclear etiology.  The lump is approximately 1 cm x 1.5 cm and slightly discolored.  See clinical photo.  Plan to get vascular ultrasound as it does seem to be connected to one of the varicose veins that runs through the area.  Suspect superficial thrombophlebitis initially.  Discussed conservative measures with Tylenol, heat, ice, and compression socks.    Procedures performed this visit: None.  Return in about 2 weeks (around 05/22/2022) for nurse visit for testosterone shot.  __________________________________ Joy L. Jessup, DNP, APRN, FNP-BC Primary Care and Sports Medicine [facility] [place]  

## 2022-10-06 ENCOUNTER — Encounter: Payer: Self-pay | Admitting: Medical-Surgical

## 2022-10-06 DIAGNOSIS — G2581 Restless legs syndrome: Secondary | ICD-10-CM | POA: Insufficient documentation

## 2022-10-06 HISTORY — DX: Restless legs syndrome: G25.81

## 2022-10-06 MED ORDER — ROPINIROLE HCL 0.25 MG PO TABS: 0.2500 mg | ORAL_TABLET | Freq: Every day | ORAL | 0 refills | Status: DC

## 2022-10-07 ENCOUNTER — Encounter: Payer: Self-pay | Admitting: Medical-Surgical

## 2022-10-07 ENCOUNTER — Ambulatory Visit: Payer: BC Managed Care – PPO | Attending: Medical-Surgical

## 2022-10-07 DIAGNOSIS — R42 Dizziness and giddiness: Secondary | ICD-10-CM

## 2022-10-07 DIAGNOSIS — R55 Syncope and collapse: Secondary | ICD-10-CM

## 2022-10-07 NOTE — Progress Notes (Unsigned)
Enrolled for [employer] to mail a ZIO XT long term holter monitor to the patients address on file.   DOD to read/ may have  Cardiology consult scheduled by the time test completed.

## 2022-10-10 NOTE — Progress Notes (Unsigned)
PROVIDER NOTE: Information contained herein reflects review and annotations entered in association with encounter. Interpretation of such information and data should be left to medically-trained personnel. Information provided to patient can be located elsewhere in the medical record under "Patient Instructions". Document created using STT-dictation technology, any transcriptional errors that may result from process are unintentional.    Patient: Mary Cruz  Service Category: E/M  Provider: Oswaldo Done, MD  DOB: April 25, 1975  DOS: 10/16/2022  Referring Provider: Christen Butter, NP  MRN: [id number]  Specialty: Interventional Pain Management  PCP: Christen Butter, NP  Type: Established Patient  Setting: Ambulatory outpatient    Location: Office  Delivery: Face-to-face     HPI  [name], a 47 y.o. year old female, is here today because of her Chronic pain syndrome [G89.4]. [name] primary complain today is No chief complaint on file.  Pertinent problems: Ms. Shott has Chronic pain syndrome; Chronic musculoskeletal pain; Osteoarthrosis; Presence of functional implant ([employer] Lumbar spinal cord stimulator implant); Chronic low back pain (Bilateral) (R>L) w/o sciatica; Chronic lumbar radicular pain (Right L5 dermatome; Left S1 Dermatome) (Bilateral) (R>L); Chronic neck pain (1ry area of Pain) (Bilateral) (L>R); Chronic cervical radicular pain (C7 Dermatome) (Bilateral) (L>R); Chronic lower extremity pain (Bilateral) (R>L); Lumbar spondylosis; Lumbar facet syndrome (Bilateral) (R>L); Neurogenic pain; Fibromyalgia; Pain in right knee; Chronic upper extremity pain (2ry area of Pain) (Bilateral) (L>R); Chronic hip pain (3ry area of Pain) (Bilateral) (R>L); DDD (degenerative disc disease), cervical; Cervicalgia; Abnormal MRI, shoulder (Right); Spondylosis without myelopathy or radiculopathy, lumbosacral region; DDD (degenerative disc disease), lumbar; Osteoarthritis involving multiple  joints; Chronic low back pain (Bilateral) (R>L) w/ sciatica (Bilateral) (R>L); Chronic lower extremity pain (Right); Lumbosacral radiculopathy at L5 (Right); Dropfoot (Right); Lumbar facet arthropathy; Intractable low back pain; Chronic feet pain (Bilateral); Migraines; Chronic hip pain (Right); Osteoarthritis of hip (Right); Grade 1 Retrolisthesis of L4/L5; Spondylolisthesis at L4-L5 level; Lumbosacral radiculopathy/radiculitis at L4 (Right); Lower extremity numbness (Bilateral); Complaints of weakness of lower extremities (Bilateral); Abnormal MRI, lumbar spine (11/04/2021); and Paresthesia on their pertinent problem list. Pain Assessment: Severity of   is reported as a  /10. Location:    / . Onset:  . Quality:  . Timing:  . Modifying factor(s):  [name] Vitals:  vitals were not taken for this visit.  BMI: Estimated body mass index is 47.05 kg/m as calculated from the following:   Height as of 10/04/22: 5\' 4"  (1.626 m).   Weight as of 10/04/22: 274 lb 1.3 oz (124.3 kg). Last encounter: 08/19/2022. Last procedure: Visit date not found.  Reason for encounter: medication management. ***  Routine UDS ordered today.   RTCB: 01/13/2023   Pharmacotherapy Assessment  Analgesic: Oxycodone IR 5 mg, 1 tab PO q 6 hrs (20 mg/day of oxycodone) MME/day: 30 mg/day.   Monitoring: [place] PMP: PDMP reviewed during this encounter.       Pharmacotherapy: No side-effects or adverse reactions reported. Compliance: No problems identified. Effectiveness: Clinically acceptable.  No notes on file  No results found for: "CBDTHCR" No results found for: "D8THCCBX" No results found for: "D9THCCBX"  UDS:  Summary  Date Value Ref Range Status  10/22/2021 Note  Final    Comment:    ==================================================================== [employer] Select 13 (MW) ==================================================================== Test                             Result       Flag  Units  Drug Present and  Declared for Prescription Verification   Oxycodone                      178          EXPECTED   ng/mg creat   Oxymorphone                    261          EXPECTED   ng/mg creat   Noroxycodone                   1146         EXPECTED   ng/mg creat   Noroxymorphone                 143          EXPECTED   ng/mg creat    Sources of oxycodone are scheduled prescription medications.    Oxymorphone, noroxycodone, and noroxymorphone are expected    metabolites of oxycodone. Oxymorphone is also available as a    scheduled prescription medication.  ==================================================================== Test                      Result    Flag   Units      Ref Range   Creatinine              103              mg/dL      >=78 ==================================================================== Declared Medications:  The flagging and interpretation on this report are based on the  following declared medications.  Unexpected results may arise from  inaccuracies in the declared medications.   **Note: The testing scope of this panel includes these medications:   Oxycodone (Roxicodone)   **Note: The testing scope of this panel does not include the  following reported medications:   Amitriptyline (Elavil)  Benzonatate (Tessalon)  Biotin  Duloxetine (Cymbalta)  Hydrochlorothiazide (Zestoretic)  Hyoscyamine (Levsin)  Levocetirizine (Xyzal)  Lisinopril (Zestoretic)  Methocarbamol (Robaxin)  Pantoprazole (Protonix)  Probiotic  Rizatriptan (Maxalt)  Topiramate (Topamax) ==================================================================== For clinical consultation, please call [phone]. ====================================================================       ROS  Constitutional: Denies any fever or chills Gastrointestinal: No reported hemesis, hematochezia, vomiting, or acute GI distress Musculoskeletal: Denies any acute onset joint swelling, redness, loss of ROM, or  weakness Neurological: No reported episodes of acute onset apraxia, aphasia, dysarthria, agnosia, amnesia, paralysis, loss of coordination, or loss of consciousness  Medication Review  DULoxetine, Fezolinetant, Full Kit Nebulizer Set, Probiotic Product, SUMAtriptan, albuterol, benzonatate, budesonide, buprenorphine, busPIRone, famotidine, hyoscyamine, levocetirizine, lisinopril-hydrochlorothiazide, meclizine, methocarbamol, naloxone, ondansetron, pantoprazole, promethazine, promethazine-dextromethorphan, rOPINIRole, and tirzepatide  History Review  Allergy: Ms. Geier is allergic to celecoxib, doxycycline, gabapentin, latex, nabumetone, pentazocine lactate, seroquel [quetiapine], amoxicillin, bactrim [sulfamethoxazole-trimethoprim], cephalexin, erythromycin, hydromorphone hcl, and lyrica [pregabalin]. Drug: Ms. Rothfeld  reports no history of drug use. Alcohol:  reports no history of alcohol use. Tobacco:  reports that she quit smoking about 2 years ago. Her smoking use included cigarettes. She quit smokeless tobacco use about 4 years ago. Social: Ms. Trama  reports that she quit smoking about 2 years ago. Her smoking use included cigarettes. She quit smokeless tobacco use about 4 years ago. She reports that she does not drink alcohol and does not use drugs. Medical:  has a past medical history of Abscess of axillary fold (10/10/2013), Acute bronchitis with asthma with acute  exacerbation (01/23/2015), Dehydration (04/19/2019), Elevated lactic acid level (04/19/2019), Encounter for management of implanted device (12/19/2014), Encounter for therapeutic drug level monitoring (12/19/2014), Family history of thyroid disease (10/24/2016), FH: colon polyps (01/27/2021), Fibromyalgia, GERD (gastroesophageal reflux disease), History of colonic polyps (01/27/2021), History of urinary incontinence (11/07/2021), Hyperglycemia (04/19/2019), Hypertension, Implant pain at battery site (right buttocks area)  (04/05/2015), KIDNEY STONES (07/02/2007), Long term current use of opiate analgesic (12/19/2014), Morbid obesity (HCC) (12/19/2014), Muscle cramping (12/19/2014), Opiate use (30 MME/Day) (12/19/2014), Osteoarthritis, Pain, Right knee pain (01/10/2015), Sepsis (HCC) (04/19/2019), Sepsis affecting skin (10/10/2013), and Tobacco abuse (04/19/2019). Surgical: Ms. Zamboni  has a past surgical history that includes spinal cord stimulator; Occipital nerve stimulator insertion; Cholecystectomy (2000); Knee surgery; Shoulder surgery; Tubal ligation (2000); Endometrial ablation; Spinal cord stimulator implant (2008); Knee surgery (Right, 02/2015); Breast surgery; Spinal cord stimulator insertion (N/A, 06/21/2016); Shoulder arthroscopy with distal clavicle resection (Right, 10/29/2018); Subacromial decompression (Right, 10/29/2018); laparoscopic appendectomy (Right, 01/01/2020); Appendectomy; Ankle surgery; Colonoscopy with esophagogastroduodenoscopy (egd); Colonoscopy (N/A, 02/20/2021); and Esophagogastroduodenoscopy (N/A, 02/20/2021). Family: family history includes Asthma in her mother; Depression in her mother; Fibromyalgia in an other family member; Stroke in an other family member; Thyroid disease in an other family member.  Laboratory Chemistry Profile   Renal Lab Results  Component Value Date   BUN 15 05/26/2022   CREATININE 0.99 05/26/2022   LABCREA 51 11/20/2020   BCR 18 05/21/2022   GFRAA 84 08/03/2020   GFRNONAA >60 05/26/2022    Hepatic Lab Results  Component Value Date   AST 17 05/21/2022   ALT 19 05/21/2022   ALBUMIN 3.4 (L) 10/13/2021   ALKPHOS 88 10/13/2021   LIPASE 24 10/13/2021    Electrolytes Lab Results  Component Value Date   NA 135 05/26/2022   K 3.4 (L) 05/26/2022   CL 102 05/26/2022   CALCIUM 8.6 (L) 05/26/2022   MG 2.0 04/20/2019   PHOS 3.2 04/20/2019    Bone Lab Results  Component Value Date   VD25OH 21 (L) 07/23/2021    Inflammation (CRP: Acute Phase) (ESR:  Chronic Phase) Lab Results  Component Value Date   CRP 1.0 (H) 04/05/2015   ESRSEDRATE 28 (H) 11/20/2020   LATICACIDVEN 1.8 10/13/2021         Note: Above Lab results reviewed.  Recent Imaging Review  Split night study Dohmeier, Porfirio Mylar, MD     08/02/2022 12:03 PM [facility] Split Summary    [facility] SPLIT NIGHT INTERPRETATION REPORT   STUDY DATE: 07/14/2022    PATIENT NAME:  ELIDE BOUCHE         DATE OF BIRTH:  [birth year]  PATIENT ID:  [id number]    TYPE OF STUDY:  SPLIT  READING PHYSICIAN: Melvyn Novas,  SCORING TECHNICIAN: Sheena  Fields   INDICATIONS:  This 47 year-old Female endorsed the Epworth  Sleepiness Scale at 17 out of 24 (scores above or equal to 10 are  suggestive of hypersomnolence).  DESCRIPTION: A sleep technologist was in attendance for the  duration of the recording.  Data collection, scoring, video  monitoring, and reporting were performed in compliance with the  AASM Manual for the Scoring of Sleep and Associated Events;  (Hypopnea is scored based on the criteria listed in Section VIII  D. 1b in the AASM Manual V2.6 using a 4% oxygen desaturation rule  or Hypopnea is scored based on the criteria listed in Section  VIII D. 1a in the AASM Manual V2.6  using 3% oxygen desaturation  and /or arousal rule).  A physician certified by the American  Board of Sleep Medicine reviewed each epoch of the study.  ADDITIONAL INFORMATION:  Height: 64.0 in Weight: 272 lb (BMI 46)  Neck Size: 17.0 ".  Medications: Proventil, Ventolin, Tessalon, Pulmicort, Buspar,  Cymbalta, Pepcid, Veozah, Levsin SL, Xyzal, Zestoretic, Antivert,  Robaxin, Narcan, Pamelor, Zofran, Oxycodone, Protonix,  Prednisone, Probiotic, Promethazine, Imitrex  TOTAL STUDY DETAILS: Lights off was at 22:18: and lights on  05:05: (408 minutes hours in bed). This study was performed with  an initial diagnostic portion  followed by positive airway  pressure titration.  Part 1 DIAGNOSTIC ANALYSIS   SLEEP CONTINUITY AND SLEEP ARCHITECTURE:  The diagnostic portion  of the study began at 22:18 and ended at 02:00, for a recording  time of 3h 42.81m minutes.  Total sleep time was 123 minutes  (26.8% supine;  73.2% lateral;  0.0% prone, 0.0% REM sleep), with  a decreased sleep efficiency at 55.4%. Sleep latency was  increased at 82.0 minutes.  Of the total sleep time, the percentage of stage N1 sleep was  9.3%, stage N2 sleep was 56.1%, stage N3 sleep was 34.6%, and REM  sleep was 0.0%. There were 0 Stage R periods observed during this  portion of the study, 19 awakenings (i.e. transitions to Stage W  from any sleep stage), and 62.0 total stage transitions. Wake  after sleep onset (WASO) time accounted for 17 minutes.   BODY POSITION: Duration of total sleep and percent of total sleep  in their respective position is as follows: supine 33 minutes  (26.8%), non-supine 90.0 minutes (73.2%); right 65 minutes  (53.3%), left 24 minutes (19.9%), and prone 00 minutes (0.0%).  Total supine REM sleep time was 00 minutes (0.0% of total REM  sleep).      RESPIRATORY MONITORING:  Based on CMS criteria (using a 4% oxygen  desaturation rule for scoring hypopneas), there were 170 apneas  (3 obstructive; 167 central; 0 mixed), and 156 hypopneas.   Apnea index was 1.0. Hypopnea index was 41.5.  The apnea-hypopnea index was 42.4 overall (11.2 supine; 0.0 REM,  0.0 supine REM). There were 0 respiratory effort-related arousals  (RERAs).  The RERA index was 0.0 events/hr.   Based on AASM criteria (using a 3% oxygen desaturation and /or  arousal rule for scoring hypopneas), there were 170 apneas (3  obstructive; 167 central; 0 mixed), and 156 hypopneas.   Apnea index was 1.0. Hypopnea index was 53.7.  The apnea-hypopnea index was 54.6 overall (14.6 supine; 0.0 REM,  0.0 supine REM). There were 0 respiratory effort-related  arousals  (RERAs).  Moderate to loud snoring was noted.    OXIMETRY: Total sleep time spent at, or below 88% was 2.0  minutes, or 1.6% of total sleep time.  Respiratory events were associated with oxyhemoglobin  desaturations (nadir 84%) from a normal baseline (mean 95%).  Total time spent at, or below 88% was 3.5 minutes, or 2.9%  of  total sleep time. Cheyne Stokes breathing.   LIMB MOVEMENTS: There were 0 periodic limb movements of sleep  (0.0/hr), of which 0 (0.0/hr) were associated with an arousal.    EKG: Analysis of electrocardiogram activity showed the highest  heart rate for the baseline portion of the study was 105.0 beats  per minute.  The average heart rate during sleep was 92 bpm,  while the lowest  heart rate for the same period was 82 bpm.   AROUSAL (  Baseline):  Arousal index was 96 /hr. Of these, 66.0  were identified as respiratory-related arousals (32.2 /hr), 0  were PLM-related arousals (0.0 /hr), and 30 were non-specific  arousals (14.6 /hr)    PART 2 TREATMENT ANALYSIS  SLEEP CONTINUITY AND SLEEP ARCHITECTURE:  The treatment portion  of the study began at 02:00 and ended at 05:05, for a recording  time of 3 h 6.71m minutes.    The patient received an Evora FX mask in [place] and was titrated  to CPAP at first, than to BiPAP and finally BiPAP ST without  improvement of apnea.   Total sleep time was 153 minutes (22.2% supine; 77.8% lateral;  0.0% prone, now with 31.7% REM sleep), with a decreased sleep  efficiency at 82.3%. Sleep latency was normal at 14.0 minutes.  REM sleep latency was decreased at 49.5 minutes.   Of the total sleep time, the percentage of stage N1 sleep was  2.9%, stage N3 sleep was 19.6%, and REM sleep was 31.7%. There  were 1 Stage R periods observed during this portion of the study,  6 awakenings (i.e. transitions to Stage W from any sleep stage),  and 19.0 total stage transitions. Wake after sleep onset (WASO)  time accounted for 19  minutes.    RESPIRATORY MONITORING:    While on PAP therapy, based on CMS criteria, the apnea-hypopnea  index was 93.7 overall (20.0 supine; 5.5 REM).   While on PAP therapy, based on AASM criteria, the apnea-hypopnea  index was 98.8 overall (21.2 supine; 8.2 REM).    BODY POSITION: Duration of total sleep and percent of total sleep  in their respective position is as follows: supine 34 minutes  (22.2%), non-supine 119.0 minutes (77.8%); right 00 minutes  (0.0%), left 119 minutes (77.8%), and prone 00 minutes (0.0%).  Total supine REM sleep time was 00 minutes (0.0% of total REM  sleep).  LIMB MOVEMENTS: There were 0 periodic limb movements of sleep  (0.0/h), of which 0 (0.0/h) were associated with an arousal.   OXIMETRY: Total sleep time spent at, or below 88% was 0.7  minutes, or 0.4% of total sleep time. Snoring was classified as .  Respiratory events were associated with oxyhemoglobin  desaturation (nadir 87.0%) from a mean of 97.0%.  Total time  spent at, or below 88% was 5.5 minutes, or 3.6% of total sleep  time.  Snoring was improved.   CARDIAC:  Analysis of electrocardiogram activity showed the highest heart  rate for the treatment portion of the study was 105.0 beats per  minute.  The average heart rate during sleep was 82 bpm, while  the highest heart rate for the same period was 100 bpm.  AROUSAL: Arousal index was 9.4 /hr. There were 24 arousals in  total, of  these, 11.0 were identified as respiratory-related  arousals (4.3 /hr), 0 were PLM-related arousals (0.0 /hr), and 13  were non-specific arousals (5.1 /hr)     IMPRESSION: This SPLIT protocol PSG confirmed a diagnosis of  severe central sleep apnea at an AHI of 55/h and was titrated  under a Evora FFM size XS. The  CPAP was provoking more central apneas and therapy was switched  to BiPAP at  13/9 cm water , from there to 16/ 12 cm BiPAP and  finally switched to ST 17/13 cm water and ST 10. Titration  could  not be completed due to time restraints.   At no time during this titration attempt was there a significant  reduction in AHI achieved- the AHI rose further under BiPAP and  BiPAP ST.   RECOMMENDATIONS: Central Sleep Apnea is strongly related to the  use of narcotics/ opiates.  This condition cannot be treated by CPAP or BiPAP .  I will not be able to provide any further care for this patient    Melvyn Novas, MD     [facility] CPAP/Bilevel Report    General Information  Name: Jakala, Corleto BMI: 16 Physician: ,   ID: [id number] Height: 64 in Technician: Margaretann Loveless  Sex: Female Weight: 272 lb Record: [id number]  Age: 44 [[birth year]] Date: 07/14/2022 Scorer: Zenon Mayo Fields   Recommended Settings IPAP: N/A cmH20 EPAP: N/A cmH2O AHI: N/A AHI  (4%): N/A              Pressure IPAP/EPAP 00 04 / 00 05 07 09 13 / 09 14 / 10 15 / 11   O2 Vol 0.0 0.0 0.0 0.0 0.0 0.0 0.0 0.0  Time TRT 226.69m 1.71m 17.43m 11.69m 14.73m 26.72m 16.29m 17.50m   TST 123.82m 0.25m 7.57m 10.105m 14.17m 22.53m 16.68m 16.37m  Sleep Stage % Wake 14.6 100.0 55.9 9.1 0.0 15.1 0.0 5.7   % REM 0.0 0.0 0.0 0.0 0.0 44.4 100.0 100.0   % N1 9.3 0.0 33.3 0.0 0.0 4.4 0.0 0.0   % N2 56.1 0.0 66.7 100.0 100.0 44.4 0.0 0.0   % N3 34.6 0.0 0.0 0.0 0.0 6.7 0.0 0.0  Respiratory Total Events 112 0 4 9 36 27 8 7    Obs. Apn. 2 0 0 1 0 0 0 0   Mixed Apn. 0 0 0 0 0 0 0 0   Cen. Apn. 0 0 0 5 33 16 0 1   Hypopneas 110 0 4 3 3 11 8 6    AHI 54.63 0.00 32.00 54.00 154.29 72.00 29.09 25.45   Supine AHI 54.55 0.00 32.00 54.00 154.29 120.00 0.00 0.00   Prone AHI 0.00 0.00 0.00 0.00 0.00 0.00 0.00 0.00   Side AHI 54.67 0.00 0.00 0.00 0.00 66.00 29.09 25.45  Respiratory (4%) Hypopneas (4%) 85.00 0.00 1.00 3.00 3.00 9.00  3.00 5.00   AHI (4%) 42.44 0.00 8.00 54.00 154.29 66.67 10.91 21.82   Supine AHI (4%) 41.82 0.00 8.00 54.00 154.29 120.00 0.00 0.00   Prone AHI (4%) 0.00 0.00 0.00 0.00 0.00  0.00 0.00 0.00   Side AHI (4%) 42.67 0.00 0.00 0.00 0.00 60.00 10.91 21.82  Desat Profile <= 90% 13.5m 0.54m 0.87m 0.41m 0.65m 2.40m 1.61m 0.90m   <= 80% 5.56m 0.7m 0.32m 0.32m 0.14m 0.19m 0.45m 0.62m   <= 70% 5.50m 0.22m 0.24m 0.55m 0.4m 0.50m 0.29m 0.66m   <= 60% 5.28m 0.40m 0.40m 0.23m 0.69m 0.46m 0.35m 0.66m  Arousal Index Apnea 1.0 0.0 0.0 0.0 0.0 5.3 0.0 0.0   Hypopnea 31.2 0.0 8.0 0.0 0.0 5.3 7.3 0.0   LM 0.0 0.0 0.0 0.0 0.0 0.0 0.0 0.0   Spontaneous 14.6 0.0 48.0 18.0 4.3 0.0 0.0 3.6               Pressure IPAP/EPAP 16 / 12 16 / 12 ST 17 / 13 ST   O2 Vol 0.0 0.0 0.0  Time TRT 34.2m 6.37m 38.31m   TST 22.46m 6.55m 38.38m  Sleep Stage % Wake 36.2 0.0 0.0   % REM 25.0 0.0 0.0   % N1 4.5 0.0 0.0   % N2 70.5 100.0 25.0   % N3  0.0 0.0 75.0  Respiratory AASM Total Events 40 17 104   Obs. Apn. 0 0 0   Mixed Apn. 0 0 0   Cen. Apn. 38 16 58   Hypopneas 2 1 46   AHI 109.09 170.00 164.21   Supine AHI 0.00 0.00 0.00   Prone AHI 0.00 0.00 0.00   Side AHI 109.09 170.00 164.21  Respiratory (4%) Hypopneas (4%) 2.00 1.00 44.00   AHI (4%) 109.09 170.00 161.05   Supine AHI (4%) 0.00 0.00 0.00   Prone AHI (4%) 0.00 0.00 0.00   Side AHI (4%) 109.09 170.00 161.05  Desat Profile <= 90% 2.45m 0.30m 0.58m   <= 80% 0.27m 0.74m 0.27m   <= 70% 0.61m 0.82m 0.40m   <= 60% 0.58m 0.16m 0.48m  Arousal Index Apnea 8.2 0.0 0.0   Hypopnea 2.7 0.0 0.0   LM 0.0 0.0 0.0   Spontaneous 5.5 0.0 0.0   General Information  Name: Margery, [place] BMI: 46.69 Physician: Melvyn Novas, MD  ID: [id number] Height: 64.0 in Technician: Margaretann Loveless, RPSGT  Sex: Female Weight: 272.0 lb Record: [id number]  Age: 6 [1975-03-28] Date: 07/14/2022     Medical & Medication History    THELMA ESTEPP is a 47 year old female, seen in request by her  primary care [facility] nurse practitioner Christen Butter,  for evaluation of bilateral hands, feet paresthesia, initial  evaluation was on February 05, 2022  to Dr Terrace Arabia.   I reviewed and  summarized the referring note. PMHx Depression,  anxiety HTN Kidney stone. Chronic low back pain, under pain  management, s/p spinal cord stimulator placement in May 2018,  oxycodone 5mg  x4 tabs a day prn. Chronic migraine Quit smoke  since Nov 2023. She had long history of headaches, getting worse  over the past few years, despite taking Topamax 50 mg every  night, she has migraine once or twice each week, lateralized  severe pounding headache with light noise sensitivity, Maxalt 10  mg as needed was helpful, but she has to take second dose 50% of  the time, sleep always helps. Also history of numbness tingling,  involving all 5 fingers on both hands, to the point she dropped  things from her hands, sometimes woke her up from sleep, have to  shake her hands to make the sensation come back  Proventil, Ventolin, Tessalon, Pulmicort, Buspar, Cymbalta,  Pepcid, Veozah, Levsin SL, Xyzal, Zestoretic, Antivert, Robaxin,  Narcan, Pamelor, Zofran, Oxycodone, Protonix, Prednisone,  Probiotic, Promethazine, Imitrex   Sleep Disorder      Comments   The patient came into the lab for a SPLIT night study. The  patient was split due to having an AHI greater than 10 after 2  hours of total sleep time per MD's order. The patient took  Oxycodone, Cymbalta, Robaxin, Buspar, Amitriptyline, and  Nortriptyline prior to start of sleep study. The patient was  fitted with a ResMed N20 (nasal) size small. [facility]AP was [facility] at  [facility] with no EPR. CPAP was increased to 9cmH2O with EPR of 1.  The patient started having CSA. The patient was switched to BiPAP  13/9 cmH2O. Nasal interface started leaking once the patient was  switched to BiPAP. Tech switched interface to F&P Evora (FFM)  size XS. The mask was a good fit to face, and the patient  tolerate the mask well. BiPAP was titrated up to 16/12 cmH2O. The  patient was still having CSA and hypopneas. Patient was switched  to BiPAP ST 17/13  BUR of 10 due to  having straight CSA. Tech  wanted to see if BiPAP ST would improve CSA. Running out of time  to titrate patient. The patient had one restroom break. EKG kept  in NSR. Moderate to loud snoring. All sleep stages witnessed.  Respiratory events scored with a 3% desat. Slept lateral and  supine. The patient did not need oxygen. AHI was 55.1 after 2 hrs  of TST.    Baseline Sleep Stage Information Baseline start time: 10:18:18 PM Baseline end time: 02:00:12 AM   Time Total Supine Side Prone Upright  Recording 3h 42.65m 2h 0.66m 1h 41.14m 0h 0.46m 0h 0.40m  Sleep 2h 3.26m 0h 33.51m 1h 30.61m 0h 0.39m 0h 0.94m   Latency N1 N2 N3 REM Onset Per. Slp. Eff.  Actual 1h 22.79m 1h 26.10m 2h 9.56m 0h 0.18m 1h 22.31m 1h 50.61m 55.41%   Stg Dur Wake N1 N2 N3 REM  Total 17.0 11.5 69.0 42.5 0.0  Supine 5.5 4.5 28.5 0.0 0.0  Side 11.5 7.0 40.5 42.5 0.0  Prone 0.0 0.0 0.0 0.0 0.0  Upright 0.0 0.0 0.0 0.0 0.0   Stg % Wake N1 N2 N3 REM  Total 12.1 9.3 56.1 34.6 0.0  Supine 3.9 3.7 23.2 0.0 0.0  Side 8.2 5.7 32.9 34.6 0.0  Prone 0.0 0.0 0.0 0.0 0.0  Upright 0.0 0.0 0.0 0.0 0.0    CPAP Sleep Stage Information CPAP start time: 02:00:12 AM CPAP end time: 05:05:57 AM   Time Total Supine Side Prone Upright  Recording (TRT) 3h 6.26m 0h 58.22m 2h 8.57m 0h 0.21m 0h 0.33m  Sleep (TST) 2h 33.34m 0h 34.57m 1h 59.32m 0h 0.69m 0h 0.21m   Latency N1 N2 N3 REM Onset Per. Slp. Eff.  Actual 0h 14.35m 0h 16.44m 0h 48.29m 0h 49.63m 0h 14.15m 0h 25.28m  82.26%   Stg Dur Wake N1 N2 N3 REM  Total 33.0 4.5 70.0 30.0 48.5  Supine 24.0 2.5 30.0 1.5 0.0  Side 9.0 2.0 40.0 28.5 48.5  Prone 0.0 0.0 0.0 0.0 0.0  Upright 0.0 0.0 0.0 0.0 0.0   Stg % Wake N1 N2 N3 REM  Total 17.7 2.9 45.8 19.6 31.7  Supine 12.9 1.6 19.6 1.0 0.0  Side 4.8 1.3 26.1 18.6 31.7  Prone 0.0 0.0 0.0 0.0 0.0  Upright 0.0 0.0 0.0 0.0 0.0    Baseline Respiratory Information Apnea Summary Sub Supine Side Prone Upright  Total 2 Total 2 0 2 0 0    REM 0 0 0 0 0    NREM 2 0 2 0  0  Obs 2 REM 0 0 0 0 0    NREM 2 0 2 0 0  Mix 0 REM 0 0 0 0 0    NREM 0 0 0 0 0  Cen 0 REM 0 0 0 0 0    NREM 0 0 0 0 0   Rera Summary Sub Supine Side Prone Upright  Total 0 Total 0 0 0 0 0    REM 0 0 0 0 0    NREM 0 0 0 0 0   Hypopnea Summary Sub Supine Side Prone Upright  Total 110 Total 110 30 80 0 0    REM 0 0 0 0 0    NREM 110 30 80 0 0   4% Hypopnea Summary Sub Supine Side Prone Upright  Total (4%) 85 Total 85 23 62 0 0    REM 0 0 0 0 0  NREM 85 23 62 0 0     AHI Total Obs Mix Cen  54.63 Apnea 0.98 0.98 0.00 0.00   Hypopnea 53.66 -- -- --  42.44 Hypopnea (4%) 41.46 -- -- --    Total Supine Side Prone Upright  Position AHI 54.63 54.55 54.67 0.00 0.00  REM AHI 0.00   NREM AHI 54.63   Position RDI 54.63 54.55 54.67 0.00 0.00  REM RDI 0.00   NREM RDI 54.63    4% Hypopnea Total Supine Side Prone Upright  Position AHI (4%) 42.44 41.82 42.67 0.00 0.00  REM AHI (4%) 0.00   NREM AHI (4%) 42.44   Position RDI (4%) 42.44 41.82 42.67 0.00 0.00  REM RDI (4%) 0.00   NREM RDI (4%) 42.44    CPAP Respiratory Information Apnea Summary Sub Supine Side Prone Upright  Total 168 Total 168 43 125 0 0    REM 1 0 1 0 0    NREM 167 43 124 0 0  Obs 1 REM 0 0 0 0 0    NREM 1 1 0 0 0  Mix 0 REM 0 0 0 0 0    NREM 0 0 0 0 0  Cen 167 REM 1 0 1 0 0    NREM 166 42 124 0 0   Rera Summary Sub Supine Side Prone Upright  Total 0 Total 0 0 0 0 0    REM 0 0 0 0 0    NREM 0 0 0 0 0   Hypopnea Summary Sub Supine Side Prone Upright  Total 84 Total 84 11 73 0 0    REM 20 0 20 0 0    NREM 64 11 53 0 0   4% Hypopnea Summary Sub Supine Side Prone Upright  Total (4%) 71 Total 71 8 63 0 0    REM 13 0 13 0 0    NREM 58 8 50 0 0     AHI Total Obs Mix Cen  98.82 Apnea 65.88 0.39 0.00 65.49   Hypopnea 32.94 -- -- --  93.73 Hypopnea (4%) 27.84 -- -- --    Total Supine Side Prone Upright  Position AHI 98.82 95.29 99.83 0.00 0.00  REM AHI 25.98   NREM AHI 132.63   Position RDI 98.82  95.29 99.83 0.00 0.00  REM RDI 25.98   NREM RDI 132.63    4% Hypopnea Total Supine Side Prone Upright  Position AHI (4%) 93.73 90.00 94.79 0.00 0.00  REM AHI (4%) 17.32   NREM AHI (4%) 129.19   Position RDI (4%) 93.73 90.00 94.79 0.00 0.00  REM RDI (4%) 17.32   NREM RDI (4%) 129.19    Desaturation Information (Baseline)  <100% <90% <80% <70% <60% <50% <40%  Supine 20 0 0 0 0 0 0  Side 68 37 0 0 0 0 0  Prone 0 0 0 0 0 0 0  Upright 0 0 0 0 0 0 0  Total 88 37 0 0 0 0 0  Desaturation threshold setting: 4% Minimum desaturation setting: 10 seconds SaO2 nadir: 84% The longest event was a 65 sec obstructive Hypopneawith a minimum  SaO2 of 92%. The lowest SaO2 was 85% associated with a 14 sec obstructive  Hypopnea. Awakening/Arousal Information (Baseline) # of Awakenings 19  Wake after sleep onset 17.56m  Wake after persistent sleep 5.5m   Arousal Assoc. Arousals Index  Apneas 2 1.0  Hypopneas 64 31.2  Leg Movements 0 0.0  Snore 0.0 0.0  PTT Arousals 0 0.0  Spontaneous 30 14.6  Total 96 46.8   Desaturation Information (CPAP)  <100% <90% <80% <70% <60% <50% <40%  Supine 50 0 0 0 0 0 0  Side 175 19 0 0 0 0 0  Prone 0 0 0 0 0 0 0  Upright 0 0 0 0 0 0 0  Total 225 19 0 0 0 0 0  Desaturation threshold setting: 4% Minimum desaturation setting: 10 seconds SaO2 nadir: 70% The longest event was a 50 sec obstructive Hypopnea with a  minimum SaO2 of 87%. The lowest SaO2 was 87% associated with a 50 sec obstructive  Hypopnea. Awakening/Arousal Information (CPAP) # of Awakenings 6  Wake after sleep onset 19.71m  Wake after persistent sleep 17.58m   Arousal Assoc. Arousals Index  Apneas 5 2.0  Hypopneas 6 2.4  Leg Movements 0 0.0  Snore 0.0 0.0  PTT Arousals 0 0.0  Spontaneous 13 5.1  Total 24 9.4     EKG Rates (Baseline) EKG Avg Max Min  Awake 91 105 81  Asleep 92 104 82  EKG Events: Tachycardia Myoclonus Information (Baseline) PLMS LMs Index  Total LMs during PLMS 0  0.0  LMs w/ Microarousals 0 0.0   LM LMs Index  w/ Microarousal 0 0.0  w/ Awakening 0 0.0  w/ Resp Event 0 0.0  Spontaneous 0 0.0  Total 0 0.0   EKG Rates (CPAP) EKG Avg Max Min  Awake 84 105 74  Asleep 82 100 70  EKG Events: Tachycardia Myoclonus Information (CPAP) PLMS LMs Index  Total LMs during PLMS 0 0.0  LMs w/ Microarousals 0 0.0   LM LMs Index  w/ Microarousal 0 0.0  w/ Awakening 0 0.0  w/ Resp Event 0 0.0  Spontaneous 1 0.4  Total 1 0.4    Note: Reviewed        Physical Exam  General appearance: Well nourished, well developed, and well hydrated. In no apparent acute distress Mental status: Alert, oriented x 3 (person, place, & time)       Respiratory: No evidence of acute respiratory distress Eyes: PERLA Vitals: There were no vitals taken for this visit. BMI: Estimated body mass index is 47.05 kg/m as calculated from the following:   Height as of 10/04/22: 5\' 4"  (1.626 m).   Weight as of 10/04/22: 274 lb 1.3 oz (124.3 kg). Ideal: Ideal body weight: 54.7 kg (120 lb 9.5 oz) Adjusted ideal body weight: 82.5 kg (181 lb 15.8 oz)  Assessment   Diagnosis Status  1. Chronic pain syndrome   2. Chronic neck pain (1ry area of Pain) (Bilateral) (L>R)   3. Chronic upper extremity pain (2ry area of Pain) (Bilateral) (L>R)   4. Chronic hip pain (3ry area of Pain) (Bilateral) (R>L)   5. Lumbar facet syndrome (Bilateral) (R>L)   6. Intractable low back pain   7. 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 At risk for respiratory depression due to opioid   9. Pharmacologic therapy   10. Chronic use of opiate for therapeutic purpose   11. Encounter for medication management   12. Encounter for chronic pain management    Controlled Controlled Controlled   Updated Problems: No problems updated.  Plan of Care  Problem-specific:  No problem-specific Assessment & Plan notes found for this encounter.  Ms. PRYCE BUNGERT has a current medication  list which includes the following long-term medication(s): albuterol, albuterol, budesonide, buprenorphine,  famotidine, hyoscyamine, levocetirizine, lisinopril-hydrochlorothiazide, methocarbamol, naloxone, pantoprazole, ropinirole, and sumatriptan.  Pharmacotherapy (Medications Ordered): No orders of the defined types were placed in this encounter.  Orders:  No orders of the defined types were placed in this encounter.  Follow-up plan:   No follow-ups on file.      Interventional Therapies  Risk Factors  Considerations:   Latex allergy    Planned  Pending:   Diagnostic right hip x-rays and MRI (06/13/2021) Diagnostic/therapeutic right IA hip joint inj. #1    Under consideration:   Diagnostic/therapeutic right IA hip joint inj. #1  Diagnostic right suprascapular NB #1    Completed:   Palliative bilateral lumbar facet MBB x2 (11/16/2020) (90/90/80/85-90)  Therapeutic left lumbar facet MBB x2 (10/10/2016) (100/100/100/100)  Therapeutic right lumbar facet MBB x1 (08/31/2015) (n/a)  Therapeutic right lumbar facet RFA x3 (05/08/2021) (100/100/50/50) on the right side Therapeutic left lumbar facet RFA x3 (03/22/2021) (100/100/99/99) on left side Palliative left C7-T1 CESI x1 (04/13/2015) (n/a)    Therapeutic  Palliative (PRN) options:   Palliative lumbar facet MBB  Palliative lumbar facet RFA  Palliative C7-T1 CESI  PRN SCS programming adjustments (2018 replacement by Dr. Odette Fraction)   Pharmacotherapy  Nonopioids transferred 04/06/2020: Robaxin         Recent Visits Date Type Provider Dept  08/19/22 Office Visit Delano Metz, MD [facility]  07/15/22 Office Visit Delano Metz, MD [facility]  Showing recent visits within past 90 days and meeting all other requirements Future Appointments Date Type Provider Dept  10/16/22 Appointment Delano Metz, MD [facility]  Showing future appointments within next 90 days and meeting all  other requirements  I discussed the assessment and treatment plan with the patient. The patient was provided an opportunity to ask questions and all were answered. The patient agreed with the plan and demonstrated an understanding of the instructions.  Patient advised to call back or seek an in-person evaluation if the symptoms or condition worsens.  Duration of encounter: *** minutes.  Total time on encounter, as per AMA guidelines included both the face-to-face and non-face-to-face time personally spent by the physician and/or other qualified health care professional(s) on the day of the encounter (includes time in activities that require the physician or other qualified health care professional and does not include time in activities normally performed by clinical staff). Physician's time may include the following activities when performed: Preparing to see the patient (e.g., pre-charting review of records, searching for previously ordered imaging, lab work, and nerve conduction tests) Review of prior analgesic pharmacotherapies. Reviewing PMP Interpreting ordered tests (e.g., lab work, imaging, nerve conduction tests) Performing post-procedure evaluations, including interpretation of diagnostic procedures Obtaining and/or reviewing separately obtained history Performing a medically appropriate examination and/or evaluation Counseling and educating the patient/family/caregiver Ordering medications, tests, or procedures Referring and communicating with other health care professionals (when not separately reported) Documenting clinical information in the electronic or other health record Independently interpreting results (not separately reported) and communicating results to the patient/ family/caregiver Care coordination (not separately reported)  Note by: Oswaldo Done, MD Date: 10/16/2022; Time: 1:51 PM

## 2022-10-11 DIAGNOSIS — R42 Dizziness and giddiness: Secondary | ICD-10-CM | POA: Diagnosis not present

## 2022-10-11 DIAGNOSIS — R55 Syncope and collapse: Secondary | ICD-10-CM | POA: Diagnosis not present

## 2022-10-13 NOTE — Patient Instructions (Signed)
______________________________________________________________________    Opioid Pain Medication Update  To: All patients taking opioid pain medications. (I.e.: hydrocodone, hydromorphone, oxycodone, oxymorphone, morphine, codeine, methadone, tapentadol, tramadol, buprenorphine, fentanyl, etc.)  Re: Updated review of side effects and adverse reactions of opioid analgesics, as well as new information about long term effects of this class of medications.  Direct risks of long-term opioid therapy are not limited to opioid addiction and overdose. Potential medical risks include serious fractures, breathing problems during sleep, hyperalgesia, immunosuppression, chronic constipation, bowel obstruction, myocardial infarction, and tooth decay secondary to xerostomia.  Unpredictable adverse effects that can occur even if you take your medication correctly: Cognitive impairment, respiratory depression, and death. Most people think that if they take their medication "correctly", and "as instructed", that they will be safe. Nothing could be farther from the truth. In reality, a significant amount of recorded deaths associated with the use of opioids has occurred in individuals that had taken the medication for a long time, and were taking their medication correctly. The following are examples of how this can happen: Patient taking his/her medication for a long time, as instructed, without any side effects, is given a certain antibiotic or another unrelated medication, which in turn triggers a "Drug-to-drug interaction" leading to disorientation, cognitive impairment, impaired reflexes, respiratory depression or an untoward event leading to serious bodily harm or injury, including death.  Patient taking his/her medication for a long time, as instructed, without any side effects, develops an acute impairment of liver and/or kidney function. This will lead to a rapid inability of the body to breakdown and eliminate  their pain medication, which will result in effects similar to an "overdose", but with the same medicine and dose that they had always taken. This again may lead to disorientation, cognitive impairment, impaired reflexes, respiratory depression or an untoward event leading to serious bodily harm or injury, including death.  A similar problem will occur with patients as they grow older and their liver and kidney function begins to decrease as part of the aging process.  Background information: Historically, the original case for using long-term opioid therapy to treat chronic noncancer pain was based on safety assumptions that subsequent experience has called into question. In 1996, the American Pain Society and the American Academy of Pain Medicine issued a consensus statement supporting long-term opioid therapy. This statement acknowledged the dangers of opioid prescribing but concluded that the risk for addiction was low; respiratory depression induced by opioids was short-lived, occurred mainly in opioid-naive patients, and was antagonized by pain; tolerance was not a common problem; and efforts to control diversion should not constrain opioid prescribing. This has now proven to be wrong. Experience regarding the risks for opioid addiction, misuse, and overdose in community practice has failed to support these assumptions.  According to the Centers for Disease Control and Prevention, fatal overdoses involving opioid analgesics have increased sharply over the past decade. Currently, more than 96,700 people die from drug overdoses every year. Opioids are a factor in 7 out of every 10 overdose deaths. Deaths from drug overdose have surpassed motor vehicle accidents as the leading cause of death for individuals between the ages of 22 and 1.  Clinical data suggest that neuroendocrine dysfunction may be very common in both men and women, potentially causing hypogonadism, erectile dysfunction, infertility,  decreased libido, osteoporosis, and depression. Recent studies linked higher opioid dose to increased opioid-related mortality. Controlled observational studies reported that long-term opioid therapy may be associated with increased risk for cardiovascular events. Subsequent  meta-analysis concluded that the safety of long-term opioid therapy in elderly patients has not been proven.   Side Effects and adverse reactions: Common side effects: Drowsiness (sedation). Dizziness. Nausea and vomiting. Constipation. Physical dependence -- Dependence often manifests with withdrawal symptoms when opioids are discontinued or decreased. Tolerance -- As you take repeated doses of opioids, you require increased medication to experience the same effect of pain relief. Respiratory depression -- This can occur in healthy people, especially with higher doses. However, people with COPD, asthma or other lung conditions may be even more susceptible to fatal respiratory impairment.  Uncommon side effects: An increased sensitivity to feeling pain and extreme response to pain (hyperalgesia). Chronic use of opioids can lead to this. Delayed gastric emptying (the process by which the contents of your stomach are moved into your small intestine). Muscle rigidity. Immune system and hormonal dysfunction. Quick, involuntary muscle jerks (myoclonus). Arrhythmia. Itchy skin (pruritus). Dry mouth (xerostomia).  Long-term side effects: Chronic constipation. Sleep-disordered breathing (SDB). Increased risk of bone fractures. Hypothalamic-pituitary-adrenal dysregulation. Increased risk of overdose.  RISKS: Respiratory depression and death: Opioids increase the risk of respiratory depression and death.  Drug-to-drug interactions: Opioids are relatively contraindicated in combination with benzodiazepines, sleep inducers, and other central nervous system depressants. Other classes of medications (i.e.: certain antibiotics  and even over-the-counter medications) may also trigger or induce respiratory depression in some patients.  Medical conditions: Patients with pre-existing respiratory problems are at higher risk of respiratory failure and/or depression when in combination with opioid analgesics. Opioids are relatively contraindicated in some medical conditions such as central sleep apnea.   Fractures and Falls:  Opioids increase the risk and incidence of falls. This is of particular importance in elderly patients.  Endocrine System:  Long-term administration is associated with endocrine abnormalities (endocrinopathies). (Also known as Opioid-induced Endocrinopathy) Influences on both the hypothalamic-pituitary-adrenal axis?and the hypothalamic-pituitary-gonadal axis have been demonstrated with consequent hypogonadism and adrenal insufficiency in both sexes. Hypogonadism and decreased levels of dehydroepiandrosterone sulfate have been reported in men and women. Endocrine effects include: Amenorrhoea in women (abnormal absence of menstruation) Reduced libido in both sexes Decreased sexual function Erectile dysfunction in men Hypogonadisms (decreased testicular function with shrinkage of testicles) Infertility Depression and fatigue Loss of muscle mass Anxiety Depression Immune suppression Hyperalgesia Weight gain Anemia Osteoporosis Patients (particularly women of childbearing age) should avoid opioids. There is insufficient evidence to recommend routine monitoring of asymptomatic patients taking opioids in the long-term for hormonal deficiencies.  Immune System: Human studies have demonstrated that opioids have an immunomodulating effect. These effects are mediated via opioid receptors both on immune effector cells and in the central nervous system. Opioids have been demonstrated to have adverse effects on antimicrobial response and anti-tumour surveillance. Buprenorphine has been demonstrated to have  no impact on immune function.  Opioid Induced Hyperalgesia: Human studies have demonstrated that prolonged use of opioids can lead to a state of abnormal pain sensitivity, sometimes called opioid induced hyperalgesia (OIH). Opioid induced hyperalgesia is not usually seen in the absence of tolerance to opioid analgesia. Clinically, hyperalgesia may be diagnosed if the patient on long-term opioid therapy presents with increased pain. This might be qualitatively and anatomically distinct from pain related to disease progression or to breakthrough pain resulting from development of opioid tolerance. Pain associated with hyperalgesia tends to be more diffuse than the pre-existing pain and less defined in quality. Management of opioid induced hyperalgesia requires opioid dose reduction.  Cancer: Chronic opioid therapy has been associated with an increased risk  of cancer among noncancer patients with chronic pain. This association was more evident in chronic strong opioid users. Chronic opioid consumption causes significant pathological changes in the small intestine and colon. Epidemiological studies have found that there is a link between opium dependence and initiation of gastrointestinal cancers. Cancer is the second leading cause of death after cardiovascular disease. Chronic use of opioids can cause multiple conditions such as GERD, immunosuppression and renal damage as well as carcinogenic effects, which are associated with the incidence of cancers.   Mortality: Long-term opioid use has been associated with increased mortality among patients with chronic non-cancer pain (CNCP).  Prescription of long-acting opioids for chronic noncancer pain was associated with a significantly increased risk of all-cause mortality, including deaths from causes other than overdose.  Reference: Von Korff M, Kolodny A, Deyo RA, Chou R. Long-term opioid therapy reconsidered. Ann Intern Med. 2011 Sep 6;155(5):325-8. doi:  10.7326/[id number]. PMID: 40981191; PMCID: [id number]. Randon Goldsmith, Hayward RA, Dunn KM, [place] KP. Risk of adverse events in patients prescribed long-term opioids: A cohort study in the [place] Clinical Practice Research Datalink. Eur J Pain. 2019 May;23(5):908-922. doi: 10.1002/ejp.1357. Epub 2019 Jan 31. PMID: 08657846. Colameco S, Coren JS, Ciervo CA. Continuous opioid treatment for chronic noncancer pain: a time for moderation in prescribing. Postgrad Med. 2009 Jul;121(4):61-6. doi: 10.3810/pgm.2009.07.2032. PMID: 96295284. William Hamburger RN, [place]omb SD, Blazina I, Cristopher Peru, Bougatsos C, Deyo RA. The effectiveness and risks of long-term opioid therapy for chronic pain: a systematic review for a [employer] of [employer] Pathways to [employer]. Ann Intern Med. 2015 Feb 17;162(4):276-86. doi: 10.7326/M14-2559. PMID: 13244010. Caryl Bis [facility], Makuc DM. NCHS Data Brief No. 22. [place]: Centers for Disease Control and Prevention; 2009. Sep, Increase in Fatal Poisonings Involving Opioid Analgesics in the [place], 1999-2006. Song IA, Choi HR, Oh TK. Long-term opioid use and mortality in patients with chronic non-cancer pain: Ten-year follow-up study in [place] from 2010 through 2019. EClinicalMedicine. 2022 Jul 18;51:101558. doi: 10.1016/j.eclinm.2022.272536. PMID: 64403474; PMCID: [id number]. Huser, W., Schubert, T., Vogelmann, T. et al. All-cause mortality in patients with long-term opioid therapy compared with non-opioid analgesics for chronic non-cancer pain: a database study. BMC Med 18, 162 (2020). http://lester.info/ Rashidian H, Karie Kirks, Malekzadeh R, Haghdoost AA. An Ecological Study of the Association between Opiate Use and Incidence of Cancers. Addict Health. 2016 Fall;8(4):252-260. PMID: 43329518; PMCID: [id number].  Our Goal: Our goal is to control your pain with means other  than the use of opioid pain medications.  Our Recommendation: Talk to your physician about coming off of these medications. We can assist you with the tapering down and stopping these medicines. Based on the new information, even if you cannot completely stop the medication, a decrease in the dose may be associated with a lesser risk. Ask for other means of controlling the pain. Decrease or eliminate those factors that significantly contribute to your pain such as smoking, obesity, and a diet heavily tilted towards "inflammatory" nutrients.  Last Updated: 08/19/2022   ______________________________________________________________________       ______________________________________________________________________    National Pain Medication Shortage  The [place] is experiencing worsening drug shortages. These have had a negative widespread effect on patient care and treatment. Not expected to improve any time soon. Predicted to last past 2029.   Drug shortage list (generic names) Oxycodone IR Oxycodone/APAP Oxymorphone IR Hydromorphone Hydrocodone/APAP Morphine  Where is the problem?  Manufacturing and supply level.  Will this shortage  affect you?  Only if you take any of the above pain medications.  How? You may be unable to fill your prescription.  Your pharmacist may offer a "partial fill" of your prescription. (Warning: Do not accept partial fills.) Prescriptions partially filled cannot be transferred to another pharmacy. Read our Medication Rules and Regulation. Depending on how much medicine you are dependent on, you may experience withdrawals when unable to get the medication.  Recommendations: Consider ending your dependence on opioid pain medications. Ask your pain specialist to assist you with the process. Consider switching to a medication currently not in shortage, such as Buprenorphine. Talk to your pain specialist about this option. Consider decreasing your pain  medication requirements by managing tolerance thru "Drug Holidays". This may help minimize withdrawals, should you run out of medicine. Control your pain thru the use of non-pharmacological interventional therapies.   Your prescriber: Prescribers cannot be blamed for shortages. Medication manufacturing and supply issues cannot be fixed by the prescriber.   NOTE: The prescriber is not responsible for supplying the medication, or solving supply issues. Work with your pharmacist to solve it. The patient is responsible for the decision to take or continue taking the medication and for identifying and securing a legal supply source. By law, supplying the medication is the job and responsibility of the pharmacy. The prescriber is responsible for the evaluation, monitoring, and prescribing of these medications.   Prescribers will NOT: Re-issue prescriptions that have been partially filled. Re-issue prescriptions already sent to a pharmacy.  Re-send prescriptions to a different pharmacy because yours did not have your medication. Ask pharmacist to order more medicine or transfer the prescription to another pharmacy. (Read below.)  New 2023 regulation: "October 12, 2021 Revised Regulation Allows DEA-Registered Pharmacies to Transfer Electronic Prescriptions at a Patient's Request DEA Headquarters Division - Public Information Office Patients now have the ability to request their electronic prescription be transferred to another pharmacy without having to go back to their practitioner to initiate the request. This revised regulation went into effect on Monday, October 08, 2021.     At a patient's request, a DEA-registered retail pharmacy can now transfer an electronic prescription for a controlled substance (schedules II-V) to another DEA-registered retail pharmacy. Prior to this change, patients would have to go through their practitioner to cancel their prescription and have it re-issued to a different  pharmacy. The process was taxing and time consuming for both patients and practitioners.    The Drug Enforcement Administration [facility]) published its intent to revise the process for transferring electronic prescriptions on December 31, 2019.  The final rule was published in the federal register on September 06, 2021 and went into effect 30 days later.  Under the final rule, a prescription can only be transferred once between pharmacies, and only if allowed under existing state or other applicable law. The prescription must remain in its electronic form; may not be altered in any way; and the transfer must be communicated directly between two licensed pharmacists. It's important to note, any authorized refills transfer with the original prescription, which means the entire prescription will be filled at the same pharmacy".  Reference: HugeHand.is [facility] website announcement)  CheapWipes.at.pdf [employer] of [employer])   [employer] / Vol. 88, No. 143 / Thursday, September 06, 2021 / Rules and Regulations DEPARTMENT OF JUSTICE  Drug Enforcement Administration  21 CFR Part 1306  [Docket No. [id number]]  RIN [id number] Transfer of Electronic Prescriptions for Schedules II-V Controlled Substances Between  Pharmacies for Initial Filling  ______________________________________________________________________       ______________________________________________________________________    Transfer of Pain Medication between Pharmacies  Re: 2023 DEA Clarification on existing regulation  Published on DEA Website: October 12, 2021  Title: Revised Regulation Allows DEA-Registered Pharmacies to Electrical engineer Prescriptions at a Patient's Request DEA Headquarters Division - [employer]  "Patients now have the ability to  request their electronic prescription be transferred to another pharmacy without having to go back to their practitioner to initiate the request. This revised regulation went into effect on Monday, October 08, 2021.     At a patient's request, a DEA-registered retail pharmacy can now transfer an electronic prescription for a controlled substance (schedules II-V) to another DEA-registered retail pharmacy. Prior to this change, patients would have to go through their practitioner to cancel their prescription and have it re-issued to a different pharmacy. The process was taxing and time consuming for both patients and practitioners.    The Drug Enforcement Administration [facility]) published its intent to revise the process for transferring electronic prescriptions on December 31, 2019.  The final rule was published in the federal register on September 06, 2021 and went into effect 30 days later.  Under the final rule, a prescription can only be transferred once between pharmacies, and only if allowed under existing state or other applicable law. The prescription must remain in its electronic form; may not be altered in any way; and the transfer must be communicated directly between two licensed pharmacists. It's important to note, any authorized refills transfer with the original prescription, which means the entire prescription will be filled at the same pharmacy."    REFERENCES: 1. DEA website announcement [email or web address]  2. Department of Justice website  CheapWipes.at.pdf  3. DEPARTMENT OF JUSTICE Drug Enforcement Administration 21 CFR Part 1306 [Docket No. [id number]] RIN [id number] "Transfer of Electronic Prescriptions for Schedules II-V Controlled Substances Between Pharmacies for Initial  Filling"  ______________________________________________________________________       ______________________________________________________________________    Medication Rules  Purpose: To inform patients, and their family members, of our medication rules and regulations.  Applies to: All patients receiving prescriptions from our practice (written or electronic).  Pharmacy of record: This is the pharmacy where your electronic prescriptions will be sent. Make sure we have the correct one.  Electronic prescriptions: In compliance with the [facility] Strengthen Opioid Misuse Prevention (STOP) Act of 2017 (Session Conni Elliot [phone]), effective February 11, 2018, all controlled substances must be electronically prescribed. Written prescriptions, faxing, or calling prescriptions to a pharmacy will no longer be done.  Prescription refills: These will be provided only during in-person appointments. No medications will be renewed without a "face-to-face" evaluation with your provider. Applies to all prescriptions.  NOTE: The following applies primarily to controlled substances (Opioid* Pain Medications).   Type of encounter (visit): For patients receiving controlled substances, face-to-face visits are required. (Not an option and not up to the patient.)  Patient's responsibilities: Pain Pills: Bring all pain pills to every appointment (except for procedure appointments). Pill Bottles: Bring pills in original pharmacy bottle. Bring bottle, even if empty. Always bring the bottle of the most recent fill.  Medication refills: You are responsible for knowing and keeping track of what medications you are taking and when is it that you will need a refill. The day before your appointment: write a list of all prescriptions that need to be refilled. The day of the appointment: give the list to the admitting nurse. Prescriptions will  be written only during appointments. No prescriptions will be written  on procedure days. If you forget a medication: it will not be "Called in", "Faxed", or "electronically sent". You will need to get another appointment to get these prescribed. No early refills. Do not call asking to have your prescription filled early. Partial  or short prescriptions: Occasionally your pharmacy may not have enough pills to fill your prescription.  NEVER ACCEPT a partial fill or a prescription that is short of the total amount of pills that you were prescribed.  With controlled substances the law allows 72 hours for the pharmacy to complete the prescription.  If the prescription is not completed within 72 hours, the pharmacist will require a new prescription to be written. This means that you will be short on your medicine and we WILL NOT send another prescription to complete your original prescription.  Instead, request the pharmacy to send a carrier to a nearby branch to get enough medication to provide you with your full prescription. Prescription Accuracy: You are responsible for carefully inspecting your prescriptions before leaving our office. Have the discharge nurse carefully go over each prescription with you, before taking them home. Make sure that your name is accurately spelled, that your address is correct. Check the name and dose of your medication to make sure it is accurate. Check the number of pills, and the written instructions to make sure they are clear and accurate. Make sure that you are given enough medication to last until your next medication refill appointment. Taking Medication: Take medication as prescribed. When it comes to controlled substances, taking less pills or less frequently than prescribed is permitted and encouraged. Never take more pills than instructed. Never take the medication more frequently than prescribed.  Inform other Doctors: Always inform, all of your healthcare providers, of all the medications you take. Pain Medication from other Providers:  You are not allowed to accept any additional pain medication from any other Doctor or Healthcare provider. There are two exceptions to this rule. (see below) In the event that you require additional pain medication, you are responsible for notifying us, as stated below. Cough Medicine: Often these contain an opioid, such as codeine or hydrocodone. Never accept or take cough medicine containing these opioids if you are already taking an opioid* medication. The combination may cause respiratory failure and death. Medication Agreement: You are responsible for carefully reading and following our Medication Agreement. This must be signed before receiving any prescriptions from our practice. Safely store a copy of your signed Agreement. Violations to the Agreement will result in no further prescriptions. (Additional copies of our Medication Agreement are available upon request.) Laws, Rules, & Regulations: All patients are expected to follow all [address] and [employer], [employer], Rules, [place] [employer]. Ignorance of the Laws does not constitute a valid excuse.  Illegal drugs and Controlled Substances: The use of illegal substances (including, but not limited to marijuana and its derivatives) and/or the illegal use of any controlled substances is strictly prohibited. Violation of this rule may result in the immediate and permanent discontinuation of any and all prescriptions being written by our practice. The use of any illegal substances is prohibited. Adopted CDC guidelines & recommendations: Target dosing levels will be at or below 60 MME/day. Use of benzodiazepines** is not recommended.  Exceptions: There are only two exceptions to the rule of not receiving pain medications from other Healthcare Providers. Exception #1 (Emergencies): In the event of an emergency (i.e.: accident requiring emergency  care), you are allowed to receive additional pain medication. However, you are responsible for: As soon as you are  able, call our office [phone], at any time of the day or night, and leave a message stating your name, the date and nature of the emergency, and the name and dose of the medication prescribed. In the event that your call is answered by a member of our staff, make sure to document and save the date, time, and the name of the person that took your information.  Exception #2 (Planned Surgery): In the event that you are scheduled by another doctor or dentist to have any type of surgery or procedure, you are allowed (for a period no longer than 30 days), to receive additional pain medication, for the acute post-op pain. However, in this case, you are responsible for picking up a copy of our "Post-op Pain Management for Surgeons" handout, and giving it to your surgeon or dentist. This document is available at our office, and does not require an appointment to obtain it. Simply go to our office during business hours (Monday-Thursday from 8:00 AM to 4:00 PM) (Friday 8:00 AM to 12:00 Noon) or if you have a scheduled appointment with [place], prior to your surgery, and ask for it by name. In addition, you are responsible for: calling our office [phone], at any time of the day or night, and leaving a message stating your name, name of your surgeon, type of surgery, and date of procedure or surgery. Failure to comply with your responsibilities may result in termination of therapy involving the controlled substances. Medication Agreement Violation. Following the above rules, including your responsibilities will help you in avoiding a Medication Agreement Violation ("Breaking your Pain Medication Contract").  Consequences:  Not following the above rules may result in permanent discontinuation of medication prescription therapy.  *Opioid medications include: morphine, codeine, oxycodone, oxymorphone, hydrocodone, hydromorphone, meperidine, tramadol, tapentadol, buprenorphine, fentanyl, methadone. **Benzodiazepine  medications include: diazepam (Valium), alprazolam (Xanax), clonazepam (Klonopine), lorazepam (Ativan), clorazepate (Tranxene), chlordiazepoxide (Librium), estazolam (Prosom), oxazepam (Serax), temazepam (Restoril), triazolam (Halcion) (Last updated: 12/04/2021) ______________________________________________________________________      ______________________________________________________________________    Medication Recommendations and Reminders  Applies to: All patients receiving prescriptions (written and/or electronic).  Medication Rules & Regulations: You are responsible for reading, knowing, and following our "Medication Rules" document. These exist for your safety and that of others. They are not flexible and neither are we. Dismissing or ignoring them is an act of "non-compliance" that may result in complete and irreversible termination of such medication therapy. For safety reasons, "non-compliance" will not be tolerated. As with the [place] fundamental legal principle of "ignorance of the law is no defense", we will accept no excuses for not having read and knowing the content of documents provided to you by our practice.  Pharmacy of record:  Definition: This is the pharmacy where your electronic prescriptions will be sent.  We do not endorse any particular pharmacy. It is up to you and your insurance to decide what pharmacy to use.  We do not restrict you in your choice of pharmacy. However, once we write for your prescriptions, we will NOT be re-sending more prescriptions to fix restricted supply problems created by your pharmacy, or your insurance.  The pharmacy listed in the electronic medical record should be the one where you want electronic prescriptions to be sent. If you choose to change pharmacy, simply notify our nursing staff. Changes will be made only during your regular appointments and  not over the phone.  Recommendations: Keep all of your pain medications in a safe  place, under lock and key, even if you live alone. We will NOT replace lost, stolen, or damaged medication. We do not accept "Police Reports" as proof of medications having been stolen. After you fill your prescription, take 1 week's worth of pills and put them away in a safe place. You should keep a separate, properly labeled bottle for this purpose. The remainder should be kept in the original bottle. Use this as your primary supply, until it runs out. Once it's gone, then you know that you have 1 week's worth of medicine, and it is time to come in for a prescription refill. If you do this correctly, it is unlikely that you will ever run out of medicine. To make sure that the above recommendation works, it is very important that you make sure your medication refill appointments are scheduled at least 1 week before you run out of medicine. To do this in an effective manner, make sure that you do not leave the office without scheduling your next medication management appointment. Always ask the nursing staff to show you in your prescription , when your medication will be running out. Then arrange for the receptionist to get you a return appointment, at least 7 days before you run out of medicine. Do not wait until you have 1 or 2 pills left, to come in. This is very poor planning and does not take into consideration that we may need to cancel appointments due to bad weather, sickness, or emergencies affecting our staff. DO NOT ACCEPT A "Partial Fill": If for any reason your pharmacy does not have enough pills/tablets to completely fill or refill your prescription, do not allow for a "partial fill". The law allows the pharmacy to complete that prescription within 72 hours, without requiring a new prescription. If they do not fill the rest of your prescription within those 72 hours, you will need a separate prescription to fill the remaining amount, which we will NOT provide. If the reason for the partial fill is  your insurance, you will need to talk to the pharmacist about payment alternatives for the remaining tablets, but again, DO NOT ACCEPT A PARTIAL FILL, unless you can trust your pharmacist to obtain the remainder of the pills within 72 hours.  Prescription refills and/or changes in medication(s):  Prescription refills, and/or changes in dose or medication, will be conducted only during scheduled medication management appointments. (Applies to both, written and electronic prescriptions.) No refills on procedure days. No medication will be changed or started on procedure days. No changes, adjustments, and/or refills will be conducted on a procedure day. Doing so will interfere with the diagnostic portion of the procedure. No phone refills. No medications will be "called into the pharmacy". No Fax refills. No weekend refills. No Holliday refills. No after hours refills.  Remember:  Business hours are:  Monday to Thursday 8:00 AM to 4:00 PM Provider's Schedule: Delano Metz, MD - Appointments are:  Medication management: Monday and Wednesday 8:00 AM to 4:00 PM Procedure day: Tuesday and Thursday 7:30 AM to 4:00 PM Edward Jolly, MD - Appointments are:  Medication management: Tuesday and Thursday 8:00 AM to 4:00 PM Procedure day: Monday and Wednesday 7:30 AM to 4:00 PM (Last update: 12/04/2021) ______________________________________________________________________      ______________________________________________________________________     Naloxone Nasal Spray  Why am I receiving this medication? [place] STOP ACT requires that all patients  taking high dose opioids or at risk of opioids respiratory depression, be prescribed an opioid reversal agent, such as Naloxone (AKA: Narcan).  What is this medication? NALOXONE (nal OX one) treats opioid overdose, which causes slow or shallow breathing, severe drowsiness, or trouble staying awake. Call emergency services after using this  medication. You may need additional treatment. Naloxone works by reversing the effects of opioids. It belongs to a group of medications called opioid blockers.  COMMON BRAND NAME(S): Kloxxado, Narcan  What should I tell my care team before I take this medication? They need to know if you have any of these conditions: Heart disease Substance use disorder An unusual or allergic reaction to naloxone, other medications, foods, dyes, or preservatives Pregnant or trying to get pregnant Breast-feeding  When to use this medication? This medication is to be used for the treatment of respiratory depression (less than 8 breaths per minute) secondary to opioid overdose.   How to use this medication? This medication is for use in the nose. Lay the person on their back. Support their neck with your hand and allow the head to tilt back before giving the medication. The nasal spray should be given into 1 nostril. After giving the medication, move the person onto their side. Do not remove or test the nasal spray until ready to use. Get emergency medical help right away after giving the first dose of this medication, even if the person wakes up. You should be familiar with how to recognize the signs and symptoms of a narcotic overdose. If more doses are needed, give the additional dose in the other nostril. Talk to your care team about the use of this medication in children. While this medication may be prescribed for children as young as newborns for selected conditions, precautions do apply.  Naloxone Overdosage: If you think you have taken too much of this medicine contact a poison control center or emergency room at once.  NOTE: This medicine is only for you. Do not share this medicine with others.  What if I miss a dose? This does not apply.  What may interact with this medication? This is only used during an emergency. No interactions are expected during emergency use. This list may not describe all  possible interactions. Give your health care provider a list of all the medicines, herbs, non-prescription drugs, or dietary supplements you use. Also tell them if you smoke, drink alcohol, or use illegal drugs. Some items may interact with your medicine.  What should I watch for while using this medication? Keep this medication ready for use in the case of an opioid overdose. Make sure that you have the phone number of your care team and local hospital ready. You may need to have additional doses of this medication. Each nasal spray contains a single dose. Some emergencies may require additional doses. After use, bring the treated person to the nearest hospital or call 911. Make sure the treating care team knows that the person has received a dose of this medication. You will receive additional instructions on what to do during and after use of this medication before an emergency occurs.  What side effects may I notice from receiving this medication? Side effects that you should report to your care team as soon as possible: Allergic reactions--skin rash, itching, hives, swelling of the face, lips, tongue, or throat Side effects that usually do not require medical attention (report these to your care team if they continue or are bothersome): Constipation Dryness  or irritation inside the nose Headache Increase in blood pressure Muscle spasms Stuffy nose Toothache This list may not describe all possible side effects. Call your doctor for medical advice about side effects. You may report side effects to FDA at 1-800-FDA-1088.  Where should I keep my medication? Because this is an emergency medication, you should keep it with you at all times.  Keep out of the reach of children and pets. Store between 20 and 25 degrees C (68 and 77 degrees F). Do not freeze. Throw away any unused medication after the expiration date. Keep in original box until ready to use.  NOTE: This sheet is a summary. It may  not cover all possible information. If you have questions about this medicine, talk to your doctor, pharmacist, or health care provider.   2023 Elsevier/Gold Standard (2020-10-06 00:00:00)  ______________________________________________________________________      ______________________________________________________________________    Body mass index (BMI) Weight Management Required  URGENT: Dear Ms. Cory Roughen your weight has been found to be adversely affecting your health. Aggressive action is immediately required. Talk to your primary care physician.  Body mass index (BMI) is a common tool for deciding whether a person has an appropriate body weight.  It measures a persons weight in relation to their height.   According to the [employer] of health (NIH): A BMI of less than 18.5 means that a person is underweight. A BMI of between 18.5 and 24.9 is ideal. A BMI of between 25 and 29.9 is overweight. A BMI over 30 indicates obesity.  Body Mass Index (BMI) Classification BMI level (kg/m2) Category Associated incidence of chronic pain  <18  Underweight   18.5-24.9 Ideal body weight   25-29.9 Overweight  20%  30-34.9 Obese (Class I)  68%  35-39.9 Severe obesity (Class II)  136%  >40 Extreme obesity (Class III)  254%   Your current Estimated body mass index is 47.05 kg/m as calculated from the following:   Height as of 10/04/22: 5\' 4"  (1.626 m).   Weight as of 10/04/22: 274 lb 1.3 oz (124.3 kg).  Morbidly Obese Classification: You will be considered to be "Morbidly Obese" if your BMI is above 30 and you have one or more of the following conditions caused or associated to obesity: 1.    Type 2 Diabetes (Leading to cardiovascular diseases (CVD), stroke, peripheral vascular diseases (PVD), retinopathy, nephropathy, and neuropathy) 2.    Cardiovascular Disease (High Blood Pressure; Congestive Heart Failure; High Cholesterol; Coronary Artery Disease; Angina; Arrhythmias,  Dysrhythmias, or Heart Attacks) 3.    Breathing problems (Asthma; obesity-hypoventilation syndrome; obstructive sleep apnea; chronic inflammatory airway disease; reactive airway disease; or shortness of breath) 4.    Chronic kidney disease 5.    Liver disease (nonalcoholic fatty liver disease) 6.    High blood pressure 7.    Acid reflux (gastroesophageal reflux disease; heartburn) 8.    Osteoarthritis (OA) (affecting the hip(s), the knee(s) and/or the lower back) 9.    Low back pain (Lumbar Facet Syndrome; and/or Degenerative Disc Disease) 10.  Hip pain (Osteoarthritis of hip) (For every 1 lbs of added body weight, there is a 2 lbs increase in pressure inside of each hip articulation. 1:2 mechanical relationship) 11.  Knee pain (Osteoarthritis of knee) (For every 1 lbs of added body weight, there is a 4 lbs increase in pressure inside of each knee articulation. 1:4 mechanical relationship) (patients with a BMI>30 kg/m2 were 6.8 times more likely to develop knee OA than normal-weight individuals)  12.  Cancer: Epidemiological studies have shown that obesity is a risk factor for: post-menopausal breast cancer; cancers of the endometrium, colon and kidney cancer; malignant adenomas of the oesophagus. Obese subjects have an approximately 1.5-3.5-fold increased risk of developing these cancers compared with normal-weight subjects, and it has been estimated that between 15 and 45% of these cancers can be attributed to overweight. More recent studies suggest that obesity may also increase the risk of other types of cancer, including pancreatic, hepatic and gallbladder cancer. (Ref: Obesity and cancer. Pischon T, Nthlings U, [employer] H. Proc Nutr Soc. 2008 May;67(2):128-45. doi: 10.1017/S0029665108006976.) The International Agency for Research on Cancer (IARC) has identified 13 cancers associated with overweight and obesity: meningioma, multiple myeloma, adenocarcinoma of the esophagus, and cancers of the thyroid,  postmenopausal breast cancer, gallbladder, stomach, liver, pancreas, kidney, ovaries, uterus, colon and rectal (colorectal) cancers. 55 percent of all cancers diagnosed in women and 24 percent of those diagnosed in men are associated with overweight and obesity.  Recommendation: At this point it is urgent that you take a step back and concentrate in loosing weight. Dedicate 100% of your efforts on this task. Nothing else will improve your health more than bringing your weight down and your BMI to less than 30. If you are here, you probably have chronic pain. We know that most chronic pain patients have difficulty exercising secondary to their pain. For this reason, you must rely on proper nutrition and diet in order to lose the weight. If your BMI is above 40, you should seriously consider bariatric surgery. A realistic goal is to lose 10% of your body weight over a period of 12 months.  Be honest to yourself, if over time you have unsuccessfully tried to lose weight, then it is time for you to seek professional help and to enter a medically supervised weight management program, and/or undergo bariatric surgery. Stop procrastinating.   Pain management considerations:  1.    Pharmacological Problems: Be advised that the use of opioid analgesics (oxycodone; hydrocodone; morphine; methadone; codeine; and all of their derivatives) have been associated with decreased metabolism and weight gain.  For this reason, should we see that you are unable to lose weight while taking these medications, it may become necessary for [place] to taper down and indefinitely discontinue them.  2.    Technical Problems: The incidence of successful interventional therapies decreases as the patient's BMI increases. It is much more difficult to accomplish a safe and effective interventional therapy on a patient with a BMI above 35. 3.    Radiation Exposure Problems: The x-rays machine, used to accomplish injection therapies, will  automatically increase their x-ray output in order to capture an appropriate bone image. This means that radiation exposure increases exponentially with the patient's BMI. (The higher the BMI, the higher the radiation exposure.) Although the level of radiation used at a given time is still safe to the patient, it is not for the physician and/or assisting staff. Unfortunately, radiation exposure is accumulative. Because physicians and the staff have to do procedures and be exposed on a daily basis, this can result in health problems such as cancer and radiation burns. Radiation exposure to the staff is monitored by the radiation batches that they wear. The exposure levels are reported back to the staff on a quarterly basis. Depending on levels of exposure, physicians and staff may be obligated by law to decrease this exposure. This means that they have the right and obligation to refuse providing therapies  where they may be overexposed to radiation. For this reason, physicians may decline to offer therapies such as radiofrequency ablation or implants to patients with a BMI above 40. 4.    Current Trends: Be advised that the current trend is to no longer offer certain therapies to patients with a BMI equal to, or above 35, due to increase perioperative risks, increased technical procedural difficulties, and excessive radiation exposure to healthcare personnel.  ______________________________________________________________________

## 2022-10-15 ENCOUNTER — Ambulatory Visit: Payer: BC Managed Care – PPO

## 2022-10-16 ENCOUNTER — Ambulatory Visit: Payer: BC Managed Care – PPO

## 2022-10-16 ENCOUNTER — Ambulatory Visit (HOSPITAL_BASED_OUTPATIENT_CLINIC_OR_DEPARTMENT_OTHER): Payer: BC Managed Care – PPO | Admitting: Pain Medicine

## 2022-10-16 DIAGNOSIS — G894 Chronic pain syndrome: Secondary | ICD-10-CM

## 2022-10-16 DIAGNOSIS — M47816 Spondylosis without myelopathy or radiculopathy, lumbar region: Secondary | ICD-10-CM

## 2022-10-16 DIAGNOSIS — Z79891 Long term (current) use of opiate analgesic: Secondary | ICD-10-CM

## 2022-10-16 DIAGNOSIS — Z9189 Other specified personal risk factors, not elsewhere classified: Secondary | ICD-10-CM

## 2022-10-16 DIAGNOSIS — M5459 Other low back pain: Secondary | ICD-10-CM

## 2022-10-16 DIAGNOSIS — Z79899 Other long term (current) drug therapy: Secondary | ICD-10-CM

## 2022-10-16 DIAGNOSIS — Z91199 Patient's noncompliance with other medical treatment and regimen due to unspecified reason: Secondary | ICD-10-CM

## 2022-10-16 DIAGNOSIS — G8929 Other chronic pain: Secondary | ICD-10-CM

## 2022-10-18 ENCOUNTER — Other Ambulatory Visit: Payer: Self-pay

## 2022-10-18 DIAGNOSIS — I10 Essential (primary) hypertension: Secondary | ICD-10-CM | POA: Insufficient documentation

## 2022-10-18 DIAGNOSIS — R52 Pain, unspecified: Secondary | ICD-10-CM | POA: Insufficient documentation

## 2022-10-18 DIAGNOSIS — M1990 Unspecified osteoarthritis, unspecified site: Secondary | ICD-10-CM | POA: Insufficient documentation

## 2022-10-21 ENCOUNTER — Ambulatory Visit: Payer: BC Managed Care – PPO

## 2022-10-21 VITALS — BP 118/80 | HR 99 | Ht 63.0 in | Wt 279.4 lb

## 2022-10-21 DIAGNOSIS — R42 Dizziness and giddiness: Secondary | ICD-10-CM

## 2022-10-21 DIAGNOSIS — I10 Essential (primary) hypertension: Secondary | ICD-10-CM | POA: Diagnosis not present

## 2022-10-21 DIAGNOSIS — I351 Nonrheumatic aortic (valve) insufficiency: Secondary | ICD-10-CM | POA: Diagnosis not present

## 2022-10-21 DIAGNOSIS — R55 Syncope and collapse: Secondary | ICD-10-CM

## 2022-10-21 DIAGNOSIS — I4711 Inappropriate sinus tachycardia, so stated: Secondary | ICD-10-CM

## 2022-10-21 HISTORY — DX: Syncope and collapse: R55

## 2022-10-21 HISTORY — DX: Dizziness and giddiness: R42

## 2022-10-21 NOTE — Assessment & Plan Note (Signed)
Based on description appears vasovagal. No recurrent episode. Has completed 7-day event monitor ordered by PCP, results currently pending.  Will request our office to obtain these results once available for my review.  Will further evaluate with transthoracic echocardiogram to rule out any structural and functional issues.

## 2022-10-21 NOTE — Progress Notes (Addendum)
Cardiology Consultation:    Date:  10/21/2022   ID:  Mary Cruz, DOB 29-Sep-1975, MRN [id number]  PCP:  Christen Butter, NP  Cardiologist:  Luretha Murphy, MD   Referring MD: Christen Butter, NP   No chief complaint on file. Syncope and postural lightheadedness   ASSESSMENT AND PLAN:   [name] 47 year old woman with history of irritable bowel syndrome, fibromyalgia, obesity, chronic back pain and hip pain limiting ambulation, former smoker quit in 2023 March recently had an episode of syncope which appears to be vasovagal based on description.  Problem List Items Addressed This Visit     Hypertension    Blood pressure well-controlled.  Continue current medications hydrochlorothiazide/lisinopril combination 12.5 mg / 10 mg once daily.  Target blood pressure below 120 over 80 mmHg.  Her 10-year ASCVD score risk calculated comes up to be about 3.4%. In this setting discussed with her extensively about lifestyle modification and dietary modifications to target weight loss.  Will reassess in 1 year her risk score with an updated lipid panel on follow-up visit.      Syncope and collapse - Primary    Based on description appears vasovagal. No recurrent episode. Has completed 7-day event monitor ordered by PCP, results currently pending.  Will request our office to obtain these results once available for my review.  Will further evaluate with transthoracic echocardiogram to rule out any structural and functional issues.       Relevant Orders   EKG 12-Lead (Completed)   ECHOCARDIOGRAM COMPLETE   Postural dizziness    Postural changes in blood pressure could be contributing to her symptoms. She did activate the event monitor tracings multiple times while she had it on for 7 days.  Will review the results once available for any underlying cardiac arrhythmias.  If no structural and functional issues on the echocardiogram and no significant abnormalities on event monitor,  it would be reassuring and overall likely benign issue related to blood pressure changes.  Educated about appropriate fluid intake through the day and care before try positioning from supine to standing or sitting to standing positions.      Return to clinic in 1 year or as needed based on results from echocardiogram and event monitor studies.   Addendum: 11/25/2022: 7-day event monitor results from 10-11-2022 ordered by PCP, scanned results in the chart reviewed.  Predominantly sinus rhythm average heart rate 93/min [ranging from 53 bpm to 138 bpm] No ventricular ectopy noted. Rare supraventricular ectopy [less than 1% burden]  total triggers 65 [with diary notifications for 16 episodes], correlated with sinus rhythm ranging from 82 bpm to 134 bpm, most of which sinus tachycardia no pauses.  Echocardiogram 11/22/2022 showed normal biventricular function and mild aortic insufficiency.  - Symptoms if persistent may suggest inappropriate sinus tachycardia. -Will trial low-dose beta-blocker such as metoprolol to tartrate if she is agreeable.  History of Present Illness:    Mary Cruz is a 47 y.o. female who is being seen today for the evaluation of postural dizziness.  At the request of Christen Butter, NP.    She has history of irritable bowel syndrome, fibromyalgia,obesity, chronic back and hip pain limiting ambulation, former smoker [quit March 2023].  She had a visit to the ER September 27, 2022 after sustaining an episode of syncope at home.  Describes a sensation of diaphoresis, dizziness and associated abdominal cramping, went to use the restroom and passed out.  Husband called EMS, reportedly low blood pressures and  thready pulse.  Was overnight at [facility].  In the emergency room blood pressure was 111 over 53 mmHg,, pulse rate 86/min.  Blood work with hemoglobin 11.9, hematocrit 37.3, BUN 16, creatinine 1.3, glucose 139, sodium 136, potassium 3.9, WBC 11.9, platelets  265. Lactic acid was normal 1.9.  Troponin I x 2 was less than 0.01. CT abdomen and pelvis was done in the setting of abdominal pain showed no significant abnormality other than mild colonic constipation suggestive findings.  She was subsequently discharged home after receiving IV fluids. 7-day event monitor was ordered which she returned last Friday, results not available yet.  She did activate the device multiple times for symptoms of lightheadedness and dizziness during the monitoring.  No further recurrent episodes. She continues to report symptoms of lightheadedness and dizziness with changing position from supine to standing or sitting to standing.  Have been occurring for the past couple months. Denies any chest pain.  Does mention functional capacity limited due to chronic back pain and sciatica.  Continues to follow-up with pain clinic and takes Aleve for pain control on a daily basis.  Recently was prescribed buprenorphine, she tells me this has not been started by her yet.  No orthopnea paroxysmal nocturnal dyspnea.  Does report mild ankle swelling towards the end of the day.  Drinks 1 to 2 cans of Coke a day. Occasional coffee consumption. Quit smoking March 2023.  Was smoking up to a pack a day for many years prior to that. No alcohol consumption.  Lives at home.  Takes care of her 80-month-old granddaughter.  EKG in th e clinic today shows sinus rhythm with heart rate 99/min, QRS duration 82 ms.  PR interval normal 150 ms.  In comparison to prior EKG from May 26, 2022 with similar.  Another recent EKG from ER visit at [facility] noted sinus rhythm heart rate 82/min and was similar.  Last lipid panel available to review July 23, 2021 total cholesterol 217, LDL 136, HDL 59, triglycerides 112  Hemoglobin A1c 6.1 on May 21, 2022.  10-year ASCVD risk score calculated 3.4%.  [Age 57, White, female, total cholesterol 217, HDL 59, systolic blood pressure 118, nondiabetic,  hypertensive on treatment, smoker [former smoker]].    Past Medical History:  Diagnosis Date   Abnormal MRI, lumbar spine (11/04/2021) 11/07/2021   (11/04/2021) LUMBAR MRI FINDINGS:  DISC LEVELS:  L4-L5: Mild facet arthropathy. Left far lateral/extraforaminal disc protrusion with mild resulting left foraminal stenosis.  L5-S1: Small left foraminal disc protrusion. Bilateral facet arthropathy.     IMPRESSION:  Lower lumbar degenerative change without impingement.     Abnormal MRI, shoulder (Right) 11/15/2018   FINDINGS:  Rotator cuff: Mild to moderate supraspinatus tendinopathy potentially with mild partial-thickness bursal surface tearing of the supraspinatus. Mild infraspinatus tendinopathy.  Muscles:  Unremarkable  Biceps long head:  Unremarkable  Acromioclavicular Joint: Mild degenerative spurring with a small amount of fluid signal in the [facility] joint. Type I acromion. 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 08/19/2022   Cervicalgia 11/15/2018   Chronic cervical radicular pain (C7 Dermatome) (Bilateral) (L>R) 12/19/2014   Chronic feet pain (Bilateral) 10/30/2020   Chronic hip pain (3ry area of Pain) (Bilateral) (R>L) 08/20/2018   Chronic hip pain (Right) 06/13/2021   Chronic low back pain (  Bilateral) (R>L) w/ sciatica (Bilateral) (R>L) 06/21/2019   Chronic low back pain (Bilateral) (R>L) w/o sciatica 12/19/2014   Chronic lower extremity pain (Bilateral) (R>L) 12/19/2014   Chronic lower extremity pain (Right) 06/21/2019   Chronic lumbar radicular pain (Right L5 dermatome; Left S1 Dermatome) (Bilateral) (R>L) 12/19/2014   Right L5 radicular pain. Left S1 radicular pain.     Chronic migraine w/o aura w/o status migrainosus, not intractable 05/15/2022   Chronic musculoskeletal pain  12/19/2014   Chronic neck pain (1ry area of Pain) (Bilateral) (L>R) 12/19/2014   Chronic pain syndrome 07/09/2013   Chronic upper extremity pain (2ry area of Pain) (Bilateral) (L>R) 08/20/2018   Chronic use of opiate for therapeutic purpose 06/27/2020   Complaints of weakness of lower extremities (Bilateral) 11/07/2021   DDD (degenerative disc disease), cervical 11/15/2018   DDD (degenerative disc disease), lumbar 02/25/2019   Disorder of skeletal system 08/20/2018   Dropfoot (Right) 06/21/2019   Essential hypertension, benign 07/09/2013   Fibromyalgia    Gastroesophageal reflux disease 07/02/2007   Qualifier: Diagnosis of   By: Melvyn Neth CMA (AAMA), Patty         Generalized anxiety disorder 03/16/2014   Grade 1 Retrolisthesis of L4/L5 07/31/2021   Hypertension    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 pain 06/13/2022   Interstitial cystitis 05/04/2020   Intractable low back pain 04/25/2020   Irritable bowel syndrome with both constipation and diarrhea 07/02/2007   Qualifier: Diagnosis of   By: Melvyn Neth CMA (AAMA), Patty         Latex precautions, history of latex allergy 04/08/2019   Long term prescription opiate use 12/19/2014   Loss of consciousness (HCC) 06/13/2022   Lower extremity numbness (Bilateral) 11/07/2021   Lumbar facet arthropathy 06/21/2019   Lumbar facet syndrome (Bilateral) (R>L) 06/29/2015   Lumbar spondylosis 06/29/2015   Lumbosacral radiculopathy at L5 (Right) 06/21/2019   Lumbosacral radiculopathy/radiculitis at L4 (Right) 07/31/2021   Metabolic syndrome 08/04/2020   Migraines 11/12/2019   Mixed hyperlipidemia 08/04/2020   Morbid obesity (HCC) 12/19/2014   Neurogenic pain 10/10/2016   Nocturia more than twice per night 06/13/2022   Osteoarthritis    Osteoarthritis involving multiple joints 04/08/2019   Osteoarthritis of hip (Right) 07/16/2021   Osteoarthrosis 12/19/2014   Pain    chronic regional pain syndrome   Pain in right knee 03/19/2017   Paresthesia 02/05/2022    Perimenopausal vasomotor symptoms 05/22/2022   Pharmacologic therapy 08/20/2018   Positive ANA (antinuclear antibody) 09/13/2015   Following with Azucena Fallen, PA at [facility] rheumatology. Recently seen for second opinion 10/03/2016 for evaluation of connective tissue disease and/or lupus. Prior serology show positive ANA without positive lupus specific antibodies.[name] Response to Plaquenil and as needed prednisone in the past. Intends do not meet clinical criteria for lupus. If specific ANA panel negative, no further workup   Prediabetes 10/19/2020   Presence of functional implant ([employer]tronic Lumbar spinal cord stimulator implant) 12/19/2014   Non-MRI compatible system. 08/2007.     Refractory chronic cough 06/13/2022   Restless leg syndrome 10/06/2022   Spinal cord stimulator status 05/16/2016   Spondylolisthesis at L4-L5 level 07/31/2021   Spondylosis without myelopathy or radiculopathy, lumbosacral region 02/25/2019   Uncomplicated opioid dependence (HCC) 12/19/2014   Vitamin D insufficiency 12/19/2014    Past Surgical History:  Procedure Laterality Date   ANKLE SURGERY     APPENDECTOMY     BREAST SURGERY     CHOLECYSTECTOMY  2000   COLONOSCOPY N/A 02/20/2021   Procedure: COLONOSCOPY;  Surgeon:  Regis Bill, MD;  Location: [facility] ENDOSCOPY;  Service: Endoscopy;  Laterality: N/A;   COLONOSCOPY WITH ESOPHAGOGASTRODUODENOSCOPY (EGD)     ENDOMETRIAL ABLATION     ESOPHAGOGASTRODUODENOSCOPY N/A 02/20/2021   Procedure: ESOPHAGOGASTRODUODENOSCOPY (EGD);  Surgeon: Regis Bill, MD;  Location: [facility] ENDOSCOPY;  Service: Endoscopy;  Laterality: N/A;   KNEE SURGERY     KNEE SURGERY Right 02/2015   LAPAROSCOPIC APPENDECTOMY Right 01/01/2020   Procedure: APPENDECTOMY LAPAROSCOPIC;  Surgeon: Emelia Loron, MD;  Location: [facility] ORS;  Service: General;  Laterality: Right;   OCCIPITAL NERVE STIMULATOR INSERTION     SHOULDER ARTHROSCOPY WITH DISTAL CLAVICLE RESECTION Right 10/29/2018    Procedure: SHOULDER ARTHROSCOPY WITH DISTAL CLAVICLE RESECTION;  Surgeon: Bjorn Pippin, MD;  Location: [facility];  Service: Orthopedics;  Laterality: Right;   SHOULDER SURGERY     spinal cord stimulator     SPINAL CORD STIMULATOR IMPLANT  2008   SPINAL CORD STIMULATOR INSERTION N/A 06/21/2016   Procedure: LUMBAR SPINAL CORD STIMULATOR INSERTION;  Surgeon: Odette Fraction, MD;  Location: [facility];  Service: Neurosurgery;  Laterality: N/A;  LUMBAR SPINAL CORD STIMULATOR INSERTION   SUBACROMIAL DECOMPRESSION Right 10/29/2018   Procedure: SUBACROMIAL DECOMPRESSION;  Surgeon: Bjorn Pippin, MD;  Location: [facility];  Service: Orthopedics;  Laterality: Right;   TUBAL LIGATION  2000    Current Medications: Current Meds  Medication Sig   albuterol (PROVENTIL) (2.5 MG/3ML) 0.083% nebulizer solution Take 3 mLs (2.5 mg total) by nebulization every 6 (six) hours as needed for wheezing or shortness of breath.   albuterol (VENTOLIN HFA) 108 (90 Base) MCG/ACT inhaler Inhale 2 puffs into the lungs every 6 (six) hours as needed for wheezing or shortness of breath.   benzonatate (TESSALON) 200 MG capsule Take 1 capsule (200 mg total) by mouth 3 (three) times daily as needed for cough.   budesonide (PULMICORT) 0.25 MG/2ML nebulizer solution USE 2 ML(0.25 MG) VIA NEBULIZER IN THE MORNING AND AT BEDTIME   buprenorphine (BUTRANS) 5 MCG/HR PTWK Place 1 patch onto the skin once a week for 28 days.   busPIRone (BUSPAR) 5 MG tablet Take 1-3 tablets (5-15 mg total) by mouth 2 (two) times daily as needed.   DULoxetine (CYMBALTA) 60 MG capsule Take 60 mg by mouth daily.   famotidine (PEPCID) 20 MG tablet Take 1 tablet (20 mg total) by mouth 2 (two) times daily.   Fezolinetant (VEOZAH) 45 MG TABS Take 1 tablet (45 mg total) by mouth daily.   hyoscyamine (LEVSIN SL) 0.125 MG SL tablet Place 1 tablet (0.125 mg total) under the tongue daily as needed.   levocetirizine (XYZAL) 5 MG tablet Take 1  tablet (5 mg total) by mouth every evening.   lisinopril-hydrochlorothiazide (ZESTORETIC) 10-12.5 MG tablet TAKE 1 TABLET BY MOUTH DAILY   meclizine (ANTIVERT) 25 MG tablet TAKE 1 TABLET(25 MG) BY MOUTH THREE TIMES DAILY AS NEEDED FOR DIZZINESS OR NAUSEA   methocarbamol (ROBAXIN) 750 MG tablet Take 1 tablet (750 mg total) by mouth every 8 (eight) hours as needed for muscle spasms.   naloxone (NARCAN) nasal spray 4 mg/0.1 mL Place 1 spray into the nose as needed for up to 365 doses (for opioid-induced respiratory depresssion). In case of emergency (overdose), spray once into each nostril. If no response within 3 minutes, repeat application and call 911.   ondansetron (ZOFRAN) 4 MG tablet Take 1 tablet (4 mg total) by mouth every 8 (eight) hours as needed for nausea or vomiting.  ondansetron (ZOFRAN-ODT) 4 MG disintegrating tablet Take 4 mg by mouth 3 (three) times daily as needed for nausea or vomiting.   pantoprazole (PROTONIX) 40 MG tablet TAKE 1 TABLET(40 MG) BY MOUTH DAILY   Probiotic Product (ALIGN PO) Take 1 tablet by mouth daily.   promethazine (PHENERGAN) 25 MG tablet Take 25 mg by mouth every 6 (six) hours as needed for nausea or vomiting.   promethazine-dextromethorphan (PROMETHAZINE-DM) 6.25-15 MG/5ML syrup Take 5 mLs by mouth 4 (four) times daily as needed for cough.   Respiratory Therapy Supplies (FULL KIT NEBULIZER SET) MISC Nebulizer machine of patient's choice, please include tubes and hoses as needed.  Use as directed.   rOPINIRole (REQUIP) 0.25 MG tablet Take 1 tablet (0.25 mg total) by mouth at bedtime.   SUMAtriptan 6 MG/0.5ML SOAJ Inject 6 mg into the skin every 2 (two) hours as needed. Take at headache onset May repeat in 2 hours if headache persists or recurs. Max twice daily.   tirzepatide [facility]) 2.5 MG/0.5ML Pen Inject 2.5 mg into the skin once a week.     Allergies:   Celecoxib, Doxycycline, Gabapentin, Latex, Nabumetone, Pentazocine lactate, Seroquel [quetiapine],  Amoxicillin, Bactrim [sulfamethoxazole-trimethoprim], Cephalexin, Erythromycin, Hydromorphone hcl, and Lyrica [pregabalin]   Social History   Socioeconomic History   Marital status: Married    Spouse name: Not on file   Number of children: Not on file   Years of education: Not on file   Highest education level: Some college, no degree  Occupational History   Not on file  Tobacco Use   Smoking status: Former    Current packs/day: 0.00    Types: Cigarettes    Quit date: 05/06/2020    Years since quitting: 2.4   Smokeless tobacco: Former    Quit date: 12/22/2017  Vaping Use   Vaping status: Every Day   Substances: Nicotine, Flavoring  Substance and Sexual Activity   Alcohol use: No    Alcohol/week: 0.0 standard drinks of alcohol   Drug use: No   Sexual activity: Yes    Partners: Male    Birth control/protection: Surgical  Other Topics Concern   Not on file  Social History Narrative   Not on file   Social Determinants of Health   Financial Resource Strain: Low Risk  (05/21/2022)   Overall Financial Resource Strain (CARDIA)    Difficulty of Paying Living Expenses: Not very hard  Food Insecurity: No Food Insecurity (05/21/2022)   Hunger Vital Sign    Worried About Running Out of Food in the Last Year: Never true    Ran Out of Food in the Last Year: Never true  Transportation Needs: No Transportation Needs (09/30/2022)   PRAPARE - Administrator, Civil Service (Medical): No    Lack of Transportation (Non-Medical): No  Physical Activity: Unknown (05/21/2022)   Exercise Vital Sign    Days of Exercise per Week: 0 days    Minutes of Exercise per Session: Not on file  Stress: No Stress Concern Present (05/21/2022)   [employer] of Occupational Health - Occupational Stress Questionnaire    Feeling of Stress : Only a little  Social Connections: Moderately Integrated (05/21/2022)   Social Connection and Isolation Panel [NHANES]    Frequency of Communication with Friends  and Family: More than three times a week    Frequency of Social Gatherings with Friends and Family: More than three times a week    Attends Religious Services: 1 to 4 times per year  Active Member of Clubs or Organizations: No    Attends Engineer, structural: Not on file    Marital Status: Married     Family History: The patient's family history includes Asthma in her mother; Depression in her mother; Fibromyalgia in an other family member; Stroke in an other family member; Thyroid disease in an other family member. ROS:   Please see the history of present illness.    All 14 point review of systems negative except as described per history of present illness.  EKGs/Labs/Other Studies Reviewed:    The following studies were reviewed today:   EKG:  EKG Interpretation Date/Time:  Monday October 21 2022 11:02:23 EDT Ventricular Rate:  99 PR Interval:  150 QRS Duration:  82 QT Interval:  346 QTC Calculation: 444 R Axis:   15  Text Interpretation: Normal sinus rhythm Cannot rule out Anterior infarct , age undetermined When compared with ECG of 26-May-2022 01:41, No significant change Confirmed by Huntley Dec reddy [phone]) on 10/21/2022 11:08:03 AM    Recent Labs: 05/21/2022: ALT 19 05/26/2022: BUN 15; Creatinine, Ser 0.99; Hemoglobin 11.3; Platelets 262; Potassium 3.4; Sodium 135  Recent Lipid Panel    Component Value Date/Time   CHOL 217 (H) 07/23/2021 0000   TRIG 112 07/23/2021 0000   HDL 59 07/23/2021 0000   CHOLHDL 3.7 07/23/2021 0000   LDLCALC 136 (H) 07/23/2021 0000    Physical Exam:    VS:  BP 118/80 (BP Location: Left Arm, Patient Position: Sitting, Cuff Size: Normal)   Pulse 99   Ht 5\' 3"  (1.6 m)   Wt 279 lb 6.4 oz (126.7 kg)   SpO2 96%   BMI 49.49 kg/m     Wt Readings from Last 3 Encounters:  10/21/22 279 lb 6.4 oz (126.7 kg)  10/04/22 274 lb 1.3 oz (124.3 kg)  09/02/22 281 lb 1.6 oz (127.5 kg)     GENERAL:  Well nourished, well developed  in no acute distress NECK: No JVD; No carotid bruits CARDIAC: RRR, S1 and S2 present, no murmurs, no rubs, no gallops CHEST:  Clear to auscultation without rales, wheezing or rhonchi  Extremities: No pitting pedal edema. Pulses bilaterally symmetric with radial 2+ and dorsalis pedis 2+ NEUROLOGIC:  Alert and oriented x 3  Medication Adjustments/Labs and Tests Ordered: Current medicines are reviewed at length with the patient today.  Concerns regarding medicines are outlined above.  Orders Placed This Encounter  Procedures   EKG 12-Lead   ECHOCARDIOGRAM COMPLETE   No orders of the defined types were placed in this encounter.   Signed, Cecille Amsterdam, MD, MPH, [facility]. 10/21/2022 11:48 AM    [facility]

## 2022-10-21 NOTE — Assessment & Plan Note (Signed)
Blood pressure well-controlled.  Continue current medications hydrochlorothiazide/lisinopril combination 12.5 mg / 10 mg once daily.  Target blood pressure below 120 over 80 mmHg.  Her 10-year ASCVD score risk calculated comes up to be about 3.4%. In this setting discussed with her extensively about lifestyle modification and dietary modifications to target weight loss.  Will reassess in 1 year her risk score with an updated lipid panel on follow-up visit.

## 2022-10-21 NOTE — Assessment & Plan Note (Signed)
Postural changes in blood pressure could be contributing to her symptoms. She did activate the event monitor tracings multiple times while she had it on for 7 days.  Will review the results once available for any underlying cardiac arrhythmias.  If no structural and functional issues on the echocardiogram and no significant abnormalities on event monitor, it would be reassuring and overall likely benign issue related to blood pressure changes.  Educated about appropriate fluid intake through the day and care before try positioning from supine to standing or sitting to standing positions.

## 2022-10-21 NOTE — Patient Instructions (Signed)
Medication Instructions:  Your physician recommends that you continue on your current medications as directed. Please refer to the Current Medication list given to you today.  *If you need a refill on your cardiac medications before your next appointment, please call your pharmacy*   Lab Work: None ordered If you have labs (blood work) drawn today and your tests are completely normal, you will receive your results only by: MyChart Message (if you have MyChart) OR A paper copy in the mail If you have any lab test that is abnormal or we need to change your treatment, we will call you to review the results.   Testing/Procedures: Your physician has requested that you have an echocardiogram. Echocardiography is a painless test that uses sound waves to create images of your heart. It provides your doctor with information about the size and shape of your heart and how well your heart's chambers and valves are working. This procedure takes approximately one hour. There are no restrictions for this procedure. Please do NOT wear cologne, perfume, aftershave, or lotions (deodorant is allowed). Please arrive 15 minutes prior to your appointment time.     Follow-Up: At [facility], you and your health needs are our priority.  As part of our continuing mission to provide you with exceptional heart care, we have created designated Provider Care Teams.  These Care Teams include your primary Cardiologist (physician) and Advanced Practice Providers (APPs -  Physician Assistants and Nurse Practitioners) who all work together to provide you with the care you need, when you need it.  We recommend signing up for the patient portal called "MyChart".  Sign up information is provided on this After Visit Summary.  MyChart is used to connect with patients for Virtual Visits (Telemedicine).  Patients are able to view lab/test results, encounter notes, upcoming appointments, etc.  Non-urgent messages can be sent to  your provider as well.   To learn more about what you can do with MyChart, go to [email or web address].    Your next appointment:   12 month(s)  The format for your next appointment:   In Person  Provider:   Rajan Revankar, MD   Other Instructions Echocardiogram An echocardiogram is a test that uses sound waves (ultrasound) to produce images of the heart. Images from an echocardiogram can provide important information about: Heart size and shape. The size and thickness and movement of your heart's walls. Heart muscle function and strength. Heart valve function or if you have stenosis. Stenosis is when the heart valves are too narrow. If blood is flowing backward through the heart valves (regurgitation). A tumor or infectious growth around the heart valves. Areas of heart muscle that are not working well because of poor blood flow or injury from a heart attack. Aneurysm detection. An aneurysm is a weak or damaged part of an artery wall. The wall bulges out from the normal force of blood pumping through the body. Tell a health care provider about: Any allergies you have. All medicines you are taking, including vitamins, herbs, eye drops, creams, and over-the-counter medicines. Any blood disorders you have. Any surgeries you have had. Any medical conditions you have. Whether you are pregnant or may be pregnant. What are the risks? Generally, this is a safe test. However, problems may occur, including an allergic reaction to dye (contrast) that may be used during the test. What happens before the test? No specific preparation is needed. You may eat and drink normally. What happens during the test? You will   take off your clothes from the waist up and put on a hospital gown. Electrodes or electrocardiogram (ECG)patches may be placed on your chest. The electrodes or patches are then connected to a device that monitors your heart rate and rhythm. You will lie down on a table for an  ultrasound exam. A gel will be applied to your chest to help sound waves pass through your skin. A handheld device, called a transducer, will be pressed against your chest and moved over your heart. The transducer produces sound waves that travel to your heart and bounce back (or "echo" back) to the transducer. These sound waves will be captured in real-time and changed into images of your heart that can be viewed on a video monitor. The images will be recorded on a computer and reviewed by your health care provider. You may be asked to change positions or hold your breath for a short time. This makes it easier to get different views or better views of your heart. In some cases, you may receive contrast through an IV in one of your veins. This can improve the quality of the pictures from your heart. The procedure may vary among health care providers and hospitals.   What can I expect after the test? You may return to your normal, everyday life, including diet, activities, and medicines, unless your health care provider tells you not to do that. Follow these instructions at home: It is up to you to get the results of your test. Ask your health care provider, or the department that is doing the test, when your results will be ready. Keep all follow-up visits. This is important. Summary An echocardiogram is a test that uses sound waves (ultrasound) to produce images of the heart. Images from an echocardiogram can provide important information about the size and shape of your heart, heart muscle function, heart valve function, and other possible heart problems. You do not need to do anything to prepare before this test. You may eat and drink normally. After the echocardiogram is completed, you may return to your normal, everyday life, unless your health care provider tells you not to do that. This information is not intended to replace advice given to you by your health care provider. Make sure you  discuss any questions you have with your health care provider. Document Revised: 09/21/2019 Document Reviewed: 09/21/2019 [employer] Patient Education  2021 [employer]   Important Information About Sugar        

## 2022-10-21 NOTE — Progress Notes (Unsigned)
PROVIDER NOTE: Information contained herein reflects review and annotations entered in association with encounter. Interpretation of such information and data should be left to medically-trained personnel. Information provided to patient can be located elsewhere in the medical record under "Patient Instructions". Document created using STT-dictation technology, any transcriptional errors that may result from process are unintentional.    Patient: Mary Cruz  Service Category: E/M  Provider: Oswaldo Done, MD  DOB: 1975/09/24  DOS: 10/23/2022  Referring Provider: Christen Butter, NP  MRN: [id number]  Specialty: Interventional Pain Management  PCP: Christen Butter, NP  Type: Established Patient  Setting: Ambulatory outpatient    Location: Office  Delivery: Face-to-face     HPI  [name], a 47 y.o. year old female, is here today because of her No primary diagnosis found.. Ms. Blaisdell primary complain today is No chief complaint on file.  Pertinent problems: Ms. Kitchings has Chronic pain syndrome; Chronic musculoskeletal pain; Osteoarthrosis; Presence of functional implant (Medtronic Lumbar spinal cord stimulator implant); Chronic low back pain (Bilateral) (R>L) w/o sciatica; Chronic lumbar radicular pain (Right L5 dermatome; Left S1 Dermatome) (Bilateral) (R>L); Chronic neck pain (1ry area of Pain) (Bilateral) (L>R); Chronic cervical radicular pain (C7 Dermatome) (Bilateral) (L>R); Chronic lower extremity pain (Bilateral) (R>L); Lumbar spondylosis; Lumbar facet syndrome (Bilateral) (R>L); Neurogenic pain; Fibromyalgia; Pain in right knee; Chronic upper extremity pain (2ry area of Pain) (Bilateral) (L>R); Chronic hip pain (3ry area of Pain) (Bilateral) (R>L); DDD (degenerative disc disease), cervical; Cervicalgia; Abnormal MRI, shoulder (Right); Spondylosis without myelopathy or radiculopathy, lumbosacral region; DDD (degenerative disc disease), lumbar; Osteoarthritis involving multiple  joints; Chronic low back pain (Bilateral) (R>L) w/ sciatica (Bilateral) (R>L); Chronic lower extremity pain (Right); Lumbosacral radiculopathy at L5 (Right); Dropfoot (Right); Lumbar facet arthropathy; Intractable low back pain; Chronic feet pain (Bilateral); Migraines; Chronic hip pain (Right); Osteoarthritis of hip (Right); Grade 1 Retrolisthesis of L4/L5; Spondylolisthesis at L4-L5 level; Lumbosacral radiculopathy/radiculitis at L4 (Right); Lower extremity numbness (Bilateral); Complaints of weakness of lower extremities (Bilateral); Abnormal MRI, lumbar spine (11/04/2021); and Paresthesia on their pertinent problem list. Pain Assessment: Severity of   is reported as a  /10. Location:    / . Onset:  . Quality:  . Timing:  . Modifying factor(s):  [name] Vitals:  vitals were not taken for this visit.  BMI: Estimated body mass index is 47.05 kg/m as calculated from the following:   Height as of 10/04/22: 5\' 4"  (1.626 m).   Weight as of 10/04/22: 274 lb 1.3 oz (124.3 kg). Last encounter: 08/19/2022. Last procedure: Visit date not found.  Reason for encounter:  *** . ***  Pharmacotherapy Assessment  Analgesic: Oxycodone IR 5 mg, 1 tab PO q 6 hrs (20 mg/day of oxycodone) MME/day: 30 mg/day.   Monitoring: [place] PMP: PDMP reviewed during this encounter.       Pharmacotherapy: No side-effects or adverse reactions reported. Compliance: No problems identified. Effectiveness: Clinically acceptable.  No notes on file  No results found for: "CBDTHCR" No results found for: "D8THCCBX" No results found for: "D9THCCBX"  UDS:  Summary  Date Value Ref Range Status  10/22/2021 Note  Final    Comment:    ==================================================================== [facility] (MW) ==================================================================== Test                             Result       Flag       Units  Drug Present and Declared for  Prescription Verification   Oxycodone                       178          EXPECTED   ng/mg creat   Oxymorphone                    261          EXPECTED   ng/mg creat   Noroxycodone                   1146         EXPECTED   ng/mg creat   Noroxymorphone                 143          EXPECTED   ng/mg creat    Sources of oxycodone are scheduled prescription medications.    Oxymorphone, noroxycodone, and noroxymorphone are expected    metabolites of oxycodone. Oxymorphone is also available as a    scheduled prescription medication.  ==================================================================== Test                      Result    Flag   Units      Ref Range   Creatinine              103              mg/dL      >=69 ==================================================================== Declared Medications:  The flagging and interpretation on this report are based on the  following declared medications.  Unexpected results may arise from  inaccuracies in the declared medications.   **Note: The testing scope of this panel includes these medications:   Oxycodone (Roxicodone)   **Note: The testing scope of this panel does not include the  following reported medications:   Amitriptyline (Elavil)  Benzonatate (Tessalon)  Biotin  Duloxetine (Cymbalta)  Hydrochlorothiazide (Zestoretic)  Hyoscyamine (Levsin)  Levocetirizine (Xyzal)  Lisinopril (Zestoretic)  Methocarbamol (Robaxin)  Pantoprazole (Protonix)  Probiotic  Rizatriptan (Maxalt)  Topiramate (Topamax) ==================================================================== For clinical consultation, please call [phone]. ====================================================================       ROS  Constitutional: Denies any fever or chills Gastrointestinal: No reported hemesis, hematochezia, vomiting, or acute GI distress Musculoskeletal: Denies any acute onset joint swelling, redness, loss of ROM, or weakness Neurological: No reported episodes of acute onset apraxia,  aphasia, dysarthria, agnosia, amnesia, paralysis, loss of coordination, or loss of consciousness  Medication Review  DULoxetine, Fezolinetant, Full Kit Nebulizer Set, Probiotic Product, SUMAtriptan, albuterol, benzonatate, budesonide, buprenorphine, busPIRone, famotidine, hyoscyamine, levocetirizine, lisinopril-hydrochlorothiazide, meclizine, methocarbamol, naloxone, ondansetron, pantoprazole, promethazine, promethazine-dextromethorphan, rOPINIRole, and tirzepatide  History Review  Allergy: Ms. Selles is allergic to celecoxib, doxycycline, gabapentin, latex, nabumetone, pentazocine lactate, seroquel [quetiapine], amoxicillin, bactrim [sulfamethoxazole-trimethoprim], cephalexin, erythromycin, hydromorphone hcl, and lyrica [pregabalin]. Drug: Ms. Burlew  reports no history of drug use. Alcohol:  reports no history of alcohol use. Tobacco:  reports that she quit smoking about 2 years ago. Her smoking use included cigarettes. She quit smokeless tobacco use about 4 years ago. Social: Ms. Stasiak  reports that she quit smoking about 2 years ago. Her smoking use included cigarettes. She quit smokeless tobacco use about 4 years ago. She reports that she does not drink alcohol and does not use drugs. Medical:  has a past medical history of Abnormal MRI, lumbar spine (11/04/2021) (11/07/2021), Abnormal MRI, shoulder (Right) (11/15/2018), Anxiety and depression (01/20/2015), At risk for  respiratory depression due to opioid (06/13/2022), Battery end of life of spinal cord stimulator (05/16/2016), Central sleep apnea comorbid with prescribed opioid use (08/19/2022), Cervicalgia (11/15/2018), Chronic cervical radicular pain (C7 Dermatome) (Bilateral) (L>R) (12/19/2014), Chronic feet pain (Bilateral) (10/30/2020), Chronic hip pain (3ry area of Pain) (Bilateral) (R>L) (08/20/2018), Chronic hip pain (Right) (06/13/2021), Chronic low back pain (Bilateral) (R>L) w/ sciatica (Bilateral) (R>L) (06/21/2019), Chronic low back  pain (Bilateral) (R>L) w/o sciatica (12/19/2014), Chronic lower extremity pain (Bilateral) (R>L) (12/19/2014), Chronic lower extremity pain (Right) (06/21/2019), Chronic lumbar radicular pain (Right L5 dermatome; Left S1 Dermatome) (Bilateral) (R>L) (12/19/2014), Chronic migraine w/o aura w/o status migrainosus, not intractable (05/15/2022), Chronic musculoskeletal pain (12/19/2014), Chronic neck pain (1ry area of Pain) (Bilateral) (L>R) (12/19/2014), Chronic pain syndrome (07/09/2013), Chronic upper extremity pain (2ry area of Pain) (Bilateral) (L>R) (08/20/2018), Chronic use of opiate for therapeutic purpose (06/27/2020), Complaints of weakness of lower extremities (Bilateral) (11/07/2021), DDD (degenerative disc disease), cervical (11/15/2018), DDD (degenerative disc disease), lumbar (02/25/2019), Disorder of skeletal system (08/20/2018), Dropfoot (Right) (06/21/2019), Essential hypertension, benign (07/09/2013), Fibromyalgia, Gastroesophageal reflux disease (07/02/2007), Generalized anxiety disorder (03/16/2014), Grade 1 Retrolisthesis of L4/L5 (07/31/2021), Hypertension, 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 pain (06/13/2022), Interstitial cystitis (05/04/2020), Intractable low back pain (04/25/2020), Irritable bowel syndrome with both constipation and diarrhea (07/02/2007), Latex precautions, history of latex allergy (04/08/2019), Long term prescription opiate use (12/19/2014), Loss of consciousness (HCC) (06/13/2022), Lower extremity numbness (Bilateral) (11/07/2021), Lumbar facet arthropathy (06/21/2019), Lumbar facet syndrome (Bilateral) (R>L) (06/29/2015), Lumbar spondylosis (06/29/2015), Lumbosacral radiculopathy at L5 (Right) (06/21/2019), Lumbosacral radiculopathy/radiculitis at L4 (Right) (07/31/2021), Metabolic syndrome (08/04/2020), Migraines (11/12/2019), Mixed hyperlipidemia (08/04/2020), Morbid obesity (HCC) (12/19/2014), Neurogenic pain (10/10/2016), Nocturia more than twice per night (06/13/2022),  Osteoarthritis, Osteoarthritis involving multiple joints (04/08/2019), Osteoarthritis of hip (Right) (07/16/2021), Osteoarthrosis (12/19/2014), Pain, Pain in right knee (03/19/2017), Paresthesia (02/05/2022), Perimenopausal vasomotor symptoms (05/22/2022), Pharmacologic therapy (08/20/2018), Positive ANA (antinuclear antibody) (09/13/2015), Prediabetes (10/19/2020), Presence of functional implant (Medtronic Lumbar spinal cord stimulator implant) (12/19/2014), Refractory chronic cough (06/13/2022), Restless leg syndrome (10/06/2022), Spinal cord stimulator status (05/16/2016), Spondylolisthesis at L4-L5 level (07/31/2021), Spondylosis without myelopathy or radiculopathy, lumbosacral region (02/25/2019), Uncomplicated opioid dependence (HCC) (12/19/2014), and Vitamin D insufficiency (12/19/2014). Surgical: Ms. Ipina  has a past surgical history that includes spinal cord stimulator; Occipital nerve stimulator insertion; Cholecystectomy (2000); Knee surgery; Shoulder surgery; Tubal ligation (2000); Endometrial ablation; Spinal cord stimulator implant (2008); Knee surgery (Right, 02/2015); Breast surgery; Spinal cord stimulator insertion (N/A, 06/21/2016); Shoulder arthroscopy with distal clavicle resection (Right, 10/29/2018); Subacromial decompression (Right, 10/29/2018); laparoscopic appendectomy (Right, 01/01/2020); Appendectomy; Ankle surgery; Colonoscopy with esophagogastroduodenoscopy (egd); Colonoscopy (N/A, 02/20/2021); and Esophagogastroduodenoscopy (N/A, 02/20/2021). Family: family history includes Asthma in her mother; Depression in her mother; Fibromyalgia in an other family member; Stroke in an other family member; Thyroid disease in an other family member.  Laboratory Chemistry Profile   Renal Lab Results  Component Value Date   BUN 15 05/26/2022   CREATININE 0.99 05/26/2022   LABCREA 51 11/20/2020   BCR 18 05/21/2022   GFRAA 84 08/03/2020   GFRNONAA >60 05/26/2022    Hepatic Lab Results   Component Value Date   AST 17 05/21/2022   ALT 19 05/21/2022   ALBUMIN 3.4 (L) 10/13/2021   ALKPHOS 88 10/13/2021   LIPASE 24 10/13/2021    Electrolytes Lab Results  Component Value Date   NA 135 05/26/2022   K 3.4 (L) 05/26/2022   CL 102 05/26/2022   CALCIUM 8.6 (L) 05/26/2022   MG 2.0 04/20/2019   PHOS  3.2 04/20/2019    Bone Lab Results  Component Value Date   VD25OH 21 (L) 07/23/2021    Inflammation (CRP: Acute Phase) (ESR: Chronic Phase) Lab Results  Component Value Date   CRP 1.0 (H) 04/05/2015   ESRSEDRATE 28 (H) 11/20/2020   LATICACIDVEN 1.8 10/13/2021         Note: Above Lab results reviewed.  Recent Imaging Review  Split night study Dohmeier, Porfirio Mylar, MD     08/02/2022 12:03 PM [facility] Split Summary    [facility] SPLIT NIGHT INTERPRETATION REPORT   STUDY DATE: 07/14/2022    PATIENT NAME:  ELLICIA WIDRIG         DATE OF BIRTH:  [birth year]  PATIENT ID:  [id number]    TYPE OF STUDY:  SPLIT  READING PHYSICIAN: Melvyn Novas,  SCORING TECHNICIAN: Sheena  Fields   INDICATIONS:  This 47 year-old Female endorsed the Epworth  Sleepiness Scale at 17 out of 24 (scores above or equal to 10 are  suggestive of hypersomnolence).  DESCRIPTION: A sleep technologist was in attendance for the  duration of the recording.  Data collection, scoring, video  monitoring, and reporting were performed in compliance with the  AASM Manual for the Scoring of Sleep and Associated Events;  (Hypopnea is scored based on the criteria listed in Section VIII  D. 1b in the AASM Manual V2.6 using a 4% oxygen desaturation rule  or Hypopnea is scored based on the criteria listed in Section  VIII D. 1a in the AASM Manual V2.6 using 3% oxygen desaturation  and /or arousal rule).  A physician certified by the American  Board of Sleep Medicine reviewed each epoch of the study.  ADDITIONAL INFORMATION:  Height:  64.0 in Weight: 272 lb (BMI 46)  Neck Size: 17.0 ".  Medications: Proventil, Ventolin, Tessalon, Pulmicort, Buspar,  Cymbalta, Pepcid, Veozah, Levsin SL, Xyzal, Zestoretic, Antivert,  Robaxin, Narcan, Pamelor, Zofran, Oxycodone, Protonix,  Prednisone, Probiotic, Promethazine, Imitrex  TOTAL STUDY DETAILS: Lights off was at 22:18: and lights on  05:05: (408 minutes hours in bed). This study was performed with  an initial diagnostic portion followed by positive airway  pressure titration.  Part 1 DIAGNOSTIC ANALYSIS   SLEEP CONTINUITY AND SLEEP ARCHITECTURE:  The diagnostic portion  of the study began at 22:18 and ended at 02:00, for a recording  time of 3h 42.55m minutes.  Total sleep time was 123 minutes  (26.8% supine;  73.2% lateral;  0.0% prone, 0.0% REM sleep), with  a decreased sleep efficiency at 55.4%. Sleep latency was  increased at 82.0 minutes.  Of the total sleep time, the percentage of stage N1 sleep was  9.3%, stage N2 sleep was 56.1%, stage N3 sleep was 34.6%, and REM  sleep was 0.0%. There were 0 Stage R periods observed during this  portion of the study, 19 awakenings (i.e. transitions to Stage W  from any sleep stage), and 62.0 total stage transitions. Wake  after sleep onset (WASO) time accounted for 17 minutes.   BODY POSITION: Duration of total sleep and percent of total sleep  in their respective position is as follows: supine 33 minutes  (26.8%), non-supine 90.0 minutes (73.2%); right 65 minutes  (53.3%), left 24 minutes (19.9%), and prone 00 minutes (0.0%).  Total supine REM sleep time was 00 minutes (0.0% of total REM  sleep).      RESPIRATORY MONITORING:  Based on CMS criteria (using a 4% oxygen  desaturation rule for scoring hypopneas), there were 170 apneas  (3 obstructive; 167 central; 0 mixed), and 156 hypopneas.   Apnea index was 1.0. Hypopnea index was 41.5.  The apnea-hypopnea index was 42.4 overall (11.2 supine; 0.0 REM,  0.0 supine REM). There  were 0 respiratory effort-related arousals  (RERAs).  The RERA index was 0.0 events/hr.   Based on AASM criteria (using a 3% oxygen desaturation and /or  arousal rule for scoring hypopneas), there were 170 apneas (3  obstructive; 167 central; 0 mixed), and 156 hypopneas.   Apnea index was 1.0. Hypopnea index was 53.7.  The apnea-hypopnea index was 54.6 overall (14.6 supine; 0.0 REM,  0.0 supine REM). There were 0 respiratory effort-related arousals  (RERAs).  Moderate to loud snoring was noted.    OXIMETRY: Total sleep time spent at, or below 88% was 2.0  minutes, or 1.6% of total sleep time.  Respiratory events were associated with oxyhemoglobin  desaturations (nadir 84%) from a normal baseline (mean 95%).  Total time spent at, or below 88% was 3.5 minutes, or 2.9%  of  total sleep time. Cheyne Stokes breathing.   LIMB MOVEMENTS: There were 0 periodic limb movements of sleep  (0.0/hr), of which 0 (0.0/hr) were associated with an arousal.    EKG: Analysis of electrocardiogram activity showed the highest  heart rate for the baseline portion of the study was 105.0 beats  per minute.  The average heart rate during sleep was 92 bpm,  while the lowest  heart rate for the same period was 82 bpm.   AROUSAL (Baseline):  Arousal index was 96 /hr. Of these, 66.0  were identified as respiratory-related arousals (32.2 /hr), 0  were PLM-related arousals (0.0 /hr), and 30 were non-specific  arousals (14.6 /hr)    PART 2 TREATMENT ANALYSIS  SLEEP CONTINUITY AND SLEEP ARCHITECTURE:  The treatment portion  of the study began at 02:00 and ended at 05:05, for a recording  time of 3 h 6.67m minutes.    The patient received an Evora FX mask in [place] and was titrated  to CPAP at first, than to BiPAP and finally BiPAP ST without  improvement of apnea.   Total sleep time was 153 minutes (22.2% supine; 77.8% lateral;  0.0% prone, now with 31.7% REM sleep), with a decreased sleep  efficiency at  82.3%. Sleep latency was normal at 14.0 minutes.  REM sleep latency was decreased at 49.5 minutes.   Of the total sleep time, the percentage of stage N1 sleep was  2.9%, stage N3 sleep was 19.6%, and REM sleep was 31.7%. There  were 1 Stage R periods observed during this portion of the study,  6 awakenings (i.e. transitions to Stage W from any sleep stage),  and 19.0 total stage transitions. Wake after sleep onset (WASO)  time accounted for 19 minutes.    RESPIRATORY MONITORING:    While on PAP therapy, based on CMS criteria, the apnea-hypopnea  index was 93.7 overall (20.0 supine; 5.5 REM).   While on PAP therapy, based on AASM criteria, the apnea-hypopnea  index was 98.8 overall (21.2 supine; 8.2 REM).    BODY POSITION: Duration of total sleep and percent of total sleep  in their respective position is as follows: supine 34 minutes  (22.2%), non-supine 119.0 minutes (77.8%); right 00 minutes  (0.0%), left 119 minutes (77.8%), and prone 00 minutes (0.0%).  Total supine REM sleep time was 00 minutes (0.0% of total REM  sleep).  LIMB MOVEMENTS: There  were 0 periodic limb movements of sleep  (0.0/h), of which 0 (0.0/h) were associated with an arousal.   OXIMETRY: Total sleep time spent at, or below 88% was 0.7  minutes, or 0.4% of total sleep time. Snoring was classified as .  Respiratory events were associated with oxyhemoglobin  desaturation (nadir 87.0%) from a mean of 97.0%.  Total time  spent at, or below 88% was 5.5 minutes, or 3.6% of total sleep  time.  Snoring was improved.   CARDIAC:  Analysis of electrocardiogram activity showed the highest heart  rate for the treatment portion of the study was 105.0 beats per  minute.  The average heart rate during sleep was 82 bpm, while  the highest heart rate for the same period was 100 bpm.  AROUSAL: Arousal index was 9.4 /hr. There were 24 arousals in  total, of  these, 11.0 were identified as respiratory-related  arousals  (4.3 /hr), 0 were PLM-related arousals (0.0 /hr), and 13  were non-specific arousals (5.1 /hr)     IMPRESSION: This SPLIT protocol PSG confirmed a diagnosis of  severe central sleep apnea at an AHI of 55/h and was titrated  under a Evora FFM size XS. The  CPAP was provoking more central apneas and therapy was switched  to BiPAP at  13/9 cm water , from there to 16/ 12 cm BiPAP and  finally switched to ST 17/13 cm water and ST 10. Titration could  not be completed due to time restraints.   At no time during this titration attempt was there a significant  reduction in AHI achieved- the AHI rose further under BiPAP and  BiPAP ST.   RECOMMENDATIONS: Central Sleep Apnea is strongly related to the  use of narcotics/ opiates.  This condition cannot be treated by CPAP or BiPAP .  I will not be able to provide any further care for this patient    Melvyn Novas, MD     [facility] CPAP/Bilevel Report    General Information  Name: Rokeisha, Beith BMI: 21 Physician: ,   ID: [id number] Height: 64 in Technician: Margaretann Loveless  Sex: Female Weight: 272 lb Record: [id number]  Age: 37 [03-10-1975] Date: 07/14/2022 Scorer: Zenon Mayo Fields   Recommended Settings IPAP: N/A cmH20 EPAP: N/A cmH2O AHI: N/A AHI  (4%): N/A              Pressure IPAP/EPAP 00 04 / 00 05 07 09 13 / 09 14 / 10 15 / 11   O2 Vol 0.0 0.0 0.0 0.0 0.0 0.0 0.0 0.0  Time TRT 226.65m 1.60m 17.78m 11.33m 14.77m 26.24m 16.58m 17.16m   TST 123.35m 0.86m 7.80m 10.35m 14.20m 22.33m 16.49m 16.67m  Sleep Stage % Wake 14.6 100.0 55.9 9.1 0.0 15.1 0.0 5.7   % REM 0.0 0.0 0.0 0.0 0.0 44.4 100.0 100.0   % N1 9.3 0.0 33.3 0.0 0.0 4.4 0.0 0.0   % N2 56.1 0.0 66.7 100.0 100.0 44.4 0.0 0.0   % N3 34.6 0.0 0.0 0.0 0.0 6.7 0.0 0.0  Respiratory Total Events 112 0 4 9 36 27 8 7    Obs. Apn. 2 0 0 1 0 0 0 0   Mixed Apn. 0 0 0 0 0 0 0 0   Cen. Apn. 0 0 0 5 33 16 0 1   Hypopneas 110 0 4 3 3 11 8 6    AHI 54.63 0.00  32.00 54.00 154.29 72.00 29.09 25.45   Supine  AHI 54.55 0.00 32.00 54.00 154.29 120.00 0.00 0.00   Prone AHI 0.00 0.00 0.00 0.00 0.00 0.00 0.00 0.00   Side AHI 54.67 0.00 0.00 0.00 0.00 66.00 29.09 25.45  Respiratory (4%) Hypopneas (4%) 85.00 0.00 1.00 3.00 3.00 9.00  3.00 5.00   AHI (4%) 42.44 0.00 8.00 54.00 154.29 66.67 10.91 21.82   Supine AHI (4%) 41.82 0.00 8.00 54.00 154.29 120.00 0.00 0.00   Prone AHI (4%) 0.00 0.00 0.00 0.00 0.00 0.00 0.00 0.00   Side AHI (4%) 42.67 0.00 0.00 0.00 0.00 60.00 10.91 21.82  Desat Profile <= 90% 13.38m 0.46m 0.67m 0.89m 0.52m 2.11m 1.60m 0.67m   <= 80% 5.81m 0.84m 0.67m 0.61m 0.76m 0.55m 0.83m 0.48m   <= 70% 5.68m 0.74m 0.33m 0.64m 0.25m 0.72m 0.58m 0.71m   <= 60% 5.9m 0.17m 0.29m 0.79m 0.52m 0.43m 0.33m 0.62m  Arousal Index Apnea 1.0 0.0 0.0 0.0 0.0 5.3 0.0 0.0   Hypopnea 31.2 0.0 8.0 0.0 0.0 5.3 7.3 0.0   LM 0.0 0.0 0.0 0.0 0.0 0.0 0.0 0.0   Spontaneous 14.6 0.0 48.0 18.0 4.3 0.0 0.0 3.6               Pressure IPAP/EPAP 16 / 12 16 / 12 ST 17 / 13 ST   O2 Vol 0.0 0.0 0.0  Time TRT 34.105m 6.45m 38.53m   TST 22.63m 6.69m 38.64m  Sleep Stage % Wake 36.2 0.0 0.0   % REM 25.0 0.0 0.0   % N1 4.5 0.0 0.0   % N2 70.5 100.0 25.0   % N3 0.0 0.0 75.0  Respiratory AASM Total Events 40 17 104   Obs. Apn. 0 0 0   Mixed Apn. 0 0 0   Cen. Apn. 38 16 58   Hypopneas 2 1 46   AHI 109.09 170.00 164.21   Supine AHI 0.00 0.00 0.00   Prone AHI 0.00 0.00 0.00   Side AHI 109.09 170.00 164.21  Respiratory (4%) Hypopneas (4%) 2.00 1.00 44.00   AHI (4%) 109.09 170.00 161.05   Supine AHI (4%) 0.00 0.00 0.00   Prone AHI (4%) 0.00 0.00 0.00   Side AHI (4%) 109.09 170.00 161.05  Desat Profile <= 90% 2.28m 0.2m 0.76m   <= 80% 0.30m 0.59m 0.10m   <= 70% 0.94m 0.33m 0.61m   <= 60% 0.40m 0.41m 0.73m  Arousal Index Apnea 8.2 0.0 0.0   Hypopnea 2.7 0.0 0.0   LM 0.0 0.0 0.0   Spontaneous 5.5 0.0 0.0   General Information  Name: Chasidi, Webb BMI: 46.69 Physician: Melvyn Novas, MD  ID: [id number]  Height: 64.0 in Technician: Margaretann Loveless, RPSGT  Sex: Female Weight: 272.0 lb Record: [id number]  Age: 77 [[birth year]] Date: 07/14/2022     Medical & Medication History    SOPHIAH PAYES is a 47 year old female, seen in request by her  primary care [facility] nurse practitioner Christen Butter,  for evaluation of bilateral hands, feet paresthesia, initial  evaluation was on February 05, 2022  to Dr Terrace Arabia.   I reviewed and summarized the referring note. PMHx Depression,  anxiety HTN Kidney stone. Chronic low back pain, under pain  management, s/p spinal cord stimulator placement in May 2018,  oxycodone 5mg  x4 tabs a day prn. Chronic migraine Quit smoke  since Nov 2023. She had long history of headaches, getting worse  over the past few years, despite taking Topamax 50 mg every  night, she has migraine once or twice each week, lateralized  severe pounding headache with light noise sensitivity, Maxalt 10  mg as needed was helpful, but she has to take second dose 50% of  the time, sleep always helps. Also history of numbness tingling,  involving all 5 fingers on both hands, to the point she dropped  things from her hands, sometimes woke her up from sleep, have to  shake her hands to make the sensation come back  Proventil, Ventolin, Tessalon, Pulmicort, Buspar, Cymbalta,  Pepcid, Veozah, Levsin SL, Xyzal, Zestoretic, Antivert, Robaxin,  Narcan, Pamelor, Zofran, Oxycodone, Protonix, Prednisone,  Probiotic, Promethazine, Imitrex   Sleep Disorder      Comments   The patient came into the lab for a SPLIT night study. The  patient was split due to having an AHI greater than 10 after 2  hours of total sleep time per MD's order. The patient took  Oxycodone, Cymbalta, Robaxin, Buspar, Amitriptyline, and  Nortriptyline prior to start of sleep study. The patient was  fitted with a ResMed N20 (nasal) size small. CPAP was started at  [facility] with no EPR. CPAP was increased to 9cmH2O with  EPR of 1.  The patient started having CSA. The patient was switched to BiPAP  13/9 cmH2O. Nasal interface started leaking once the patient was  switched to BiPAP. Tech switched interface to F&P [place] (FFM)  size XS. The mask was a good fit to face, and the patient  tolerate the mask well. BiPAP was titrated up to 16/12 cmH2O. The  patient was still having CSA and hypopneas. Patient was switched  to BiPAP ST 17/13 BUR of 10 due to having straight CSA. Tech  wanted to see if BiPAP ST would improve CSA. Running out of time  to titrate patient. The patient had one restroom break. EKG kept  in NSR. Moderate to loud snoring. All sleep stages witnessed.  Respiratory events scored with a 3% desat. Slept lateral and  supine. The patient did not need oxygen. AHI was 55.1 after 2 hrs  of TST.    Baseline Sleep Stage Information Baseline start time: 10:18:18 PM Baseline end time: 02:00:12 AM   Time Total Supine Side Prone Upright  Recording 3h 42.64m 2h 0.3m 1h 41.7m 0h 0.29m 0h 0.31m  Sleep 2h 3.24m 0h 33.61m 1h 30.61m 0h 0.59m 0h 0.45m   Latency N1 N2 N3 REM Onset Per. Slp. Eff.  Actual 1h 22.33m 1h 26.89m 2h 9.85m 0h 0.66m 1h 22.78m 1h 50.51m 55.41%   Stg Dur Wake N1 N2 N3 REM  Total 17.0 11.5 69.0 42.5 0.0  Supine 5.5 4.5 28.5 0.0 0.0  Side 11.5 7.0 40.5 42.5 0.0  Prone 0.0 0.0 0.0 0.0 0.0  Upright 0.0 0.0 0.0 0.0 0.0   Stg % Wake N1 N2 N3 REM  Total 12.1 9.3 56.1 34.6 0.0  Supine 3.9 3.7 23.2 0.0 0.0  Side 8.2 5.7 32.9 34.6 0.0  Prone 0.0 0.0 0.0 0.0 0.0  Upright 0.0 0.0 0.0 0.0 0.0    CPAP Sleep Stage Information CPAP start time: 02:00:12 AM CPAP end time: 05:05:57 AM   Time Total Supine Side Prone Upright  Recording (TRT) 3h 6.41m 0h 58.78m 2h 8.75m 0h 0.72m 0h 0.41m  Sleep (TST) 2h 33.10m 0h 34.58m 1h 59.47m 0h 0.30m 0h 0.30m   Latency N1 N2 N3 REM Onset Per. Slp. Eff.  Actual 0h 14.54m 0h 16.88m 0h 48.9m 0h 49.24m 0h 14.64m 0h 25.38m  82.26%   Stg Dur Wake N1 N2 N3 REM  Total 33.0 4.5 70.0  30.0 48.5   Supine 24.0 2.5 30.0 1.5 0.0  Side 9.0 2.0 40.0 28.5 48.5  Prone 0.0 0.0 0.0 0.0 0.0  Upright 0.0 0.0 0.0 0.0 0.0   Stg % Wake N1 N2 N3 REM  Total 17.7 2.9 45.8 19.6 31.7  Supine 12.9 1.6 19.6 1.0 0.0  Side 4.8 1.3 26.1 18.6 31.7  Prone 0.0 0.0 0.0 0.0 0.0  Upright 0.0 0.0 0.0 0.0 0.0    Baseline Respiratory Information Apnea Summary Sub Supine Side Prone Upright  Total 2 Total 2 0 2 0 0    REM 0 0 0 0 0    NREM 2 0 2 0 0  Obs 2 REM 0 0 0 0 0    NREM 2 0 2 0 0  Mix 0 REM 0 0 0 0 0    NREM 0 0 0 0 0  Cen 0 REM 0 0 0 0 0    NREM 0 0 0 0 0   Rera Summary Sub Supine Side Prone Upright  Total 0 Total 0 0 0 0 0    REM 0 0 0 0 0    NREM 0 0 0 0 0   Hypopnea Summary Sub Supine Side Prone Upright  Total 110 Total 110 30 80 0 0    REM 0 0 0 0 0    NREM 110 30 80 0 0   4% Hypopnea Summary Sub Supine Side Prone Upright  Total (4%) 85 Total 85 23 62 0 0    REM 0 0 0 0 0    NREM 85 23 62 0 0     AHI Total Obs Mix Cen  54.63 Apnea 0.98 0.98 0.00 0.00   Hypopnea 53.66 -- -- --  42.44 Hypopnea (4%) 41.46 -- -- --    Total Supine Side Prone Upright  Position AHI 54.63 54.55 54.67 0.00 0.00  REM AHI 0.00   NREM AHI 54.63   Position RDI 54.63 54.55 54.67 0.00 0.00  REM RDI 0.00   NREM RDI 54.63    4% Hypopnea Total Supine Side Prone Upright  Position AHI (4%) 42.44 41.82 42.67 0.00 0.00  REM AHI (4%) 0.00   NREM AHI (4%) 42.44   Position RDI (4%) 42.44 41.82 42.67 0.00 0.00  REM RDI (4%) 0.00   NREM RDI (4%) 42.44    CPAP Respiratory Information Apnea Summary Sub Supine Side Prone Upright  Total 168 Total 168 43 125 0 0    REM 1 0 1 0 0    NREM 167 43 124 0 0  Obs 1 REM 0 0 0 0 0    NREM 1 1 0 0 0  Mix 0 REM 0 0 0 0 0    NREM 0 0 0 0 0  Cen 167 REM 1 0 1 0 0    NREM 166 42 124 0 0   Rera Summary Sub Supine Side Prone Upright  Total 0 Total 0 0 0 0 0    REM 0 0 0 0 0    NREM 0 0 0 0 0   Hypopnea Summary Sub Supine Side Prone Upright  Total 84 Total 84 11 73 0  0    REM 20 0 20 0 0    NREM 64 11 53 0 0   4% Hypopnea Summary Sub Supine Side Prone Upright  Total (4%) 71 Total 71 8 63 0 0    REM 13 0 13 0 0  NREM 58 8 50 0 0     AHI Total Obs Mix Cen  98.82 Apnea 65.88 0.39 0.00 65.49   Hypopnea 32.94 -- -- --  93.73 Hypopnea (4%) 27.84 -- -- --    Total Supine Side Prone Upright  Position AHI 98.82 95.29 99.83 0.00 0.00  REM AHI 25.98   NREM AHI 132.63   Position RDI 98.82 95.29 99.83 0.00 0.00  REM RDI 25.98   NREM RDI 132.63    4% Hypopnea Total Supine Side Prone Upright  Position AHI (4%) 93.73 90.00 94.79 0.00 0.00  REM AHI (4%) 17.32   NREM AHI (4%) 129.19   Position RDI (4%) 93.73 90.00 94.79 0.00 0.00  REM RDI (4%) 17.32   NREM RDI (4%) 129.19    Desaturation Information (Baseline)  <100% <90% <80% <70% <60% <50% <40%  Supine 20 0 0 0 0 0 0  Side 68 37 0 0 0 0 0  Prone 0 0 0 0 0 0 0  Upright 0 0 0 0 0 0 0  Total 88 37 0 0 0 0 0  Desaturation threshold setting: 4% Minimum desaturation setting: 10 seconds SaO2 nadir: 84% The longest event was a 65 sec obstructive Hypopneawith a minimum  SaO2 of 92%. The lowest SaO2 was 85% associated with a 14 sec obstructive  Hypopnea. Awakening/Arousal Information (Baseline) # of Awakenings 19  Wake after sleep onset 17.48m  Wake after persistent sleep 5.76m   Arousal Assoc. Arousals Index  Apneas 2 1.0  Hypopneas 64 31.2  Leg Movements 0 0.0  Snore 0.0 0.0  PTT Arousals 0 0.0  Spontaneous 30 14.6  Total 96 46.8   Desaturation Information (CPAP)  <100% <90% <80% <70% <60% <50% <40%  Supine 50 0 0 0 0 0 0  Side 175 19 0 0 0 0 0  Prone 0 0 0 0 0 0 0  Upright 0 0 0 0 0 0 0  Total 225 19 0 0 0 0 0  Desaturation threshold setting: 4% Minimum desaturation setting: 10 seconds SaO2 nadir: 70% The longest event was a 50 sec obstructive Hypopnea with a  minimum SaO2 of 87%. The lowest SaO2 was 87% associated with a 50 sec obstructive  Hypopnea. Awakening/Arousal Information  (CPAP) # of Awakenings 6  Wake after sleep onset 19.85m  Wake after persistent sleep 17.25m   Arousal Assoc. Arousals Index  Apneas 5 2.0  Hypopneas 6 2.4  Leg Movements 0 0.0  Snore 0.0 0.0  PTT Arousals 0 0.0  Spontaneous 13 5.1  Total 24 9.4     EKG Rates (Baseline) EKG Avg Max Min  Awake 91 105 81  Asleep 92 104 82  EKG Events: Tachycardia Myoclonus Information (Baseline) PLMS LMs Index  Total LMs during PLMS 0 0.0  LMs w/ Microarousals 0 0.0   LM LMs Index  w/ Microarousal 0 0.0  w/ Awakening 0 0.0  w/ Resp Event 0 0.0  Spontaneous 0 0.0  Total 0 0.0   EKG Rates (CPAP) EKG Avg Max Min  Awake 84 105 74  Asleep 82 100 70  EKG Events: Tachycardia Myoclonus Information (CPAP) PLMS LMs Index  Total LMs during PLMS 0 0.0  LMs w/ Microarousals 0 0.0   LM LMs Index  w/ Microarousal 0 0.0  w/ Awakening 0 0.0  w/ Resp Event 0 0.0  Spontaneous 1 0.4  Total 1 0.4    Note: Reviewed        Physical Exam  General appearance: Well nourished, well developed, and well hydrated. In no apparent acute distress Mental status: Alert, oriented x 3 (person, place, & time)       Respiratory: No evidence of acute respiratory distress Eyes: PERLA Vitals: There were no vitals taken for this visit. BMI: Estimated body mass index is 47.05 kg/m as calculated from the following:   Height as of 10/04/22: 5\' 4"  (1.626 m).   Weight as of 10/04/22: 274 lb 1.3 oz (124.3 kg). Ideal: Patient weight not recorded  Assessment   Diagnosis Status  No diagnosis found. Controlled Controlled Controlled   Updated Problems: No problems updated.  Plan of Care  Problem-specific:  No problem-specific Assessment & Plan notes found for this encounter.  Ms. WAVER EICHSTADT has a current medication list which includes the following long-term medication(s): albuterol, albuterol, budesonide, buprenorphine, famotidine, hyoscyamine, levocetirizine, lisinopril-hydrochlorothiazide, methocarbamol,  naloxone, pantoprazole, ropinirole, and sumatriptan.  Pharmacotherapy (Medications Ordered): No orders of the defined types were placed in this encounter.  Orders:  No orders of the defined types were placed in this encounter.  Follow-up plan:   No follow-ups on file.      Interventional Therapies  Risk Factors  Considerations:   Latex allergy    Planned  Pending:   Diagnostic right hip x-rays and MRI (06/13/2021) Diagnostic/therapeutic right IA hip joint inj. #1    Under consideration:   Diagnostic/therapeutic right IA hip joint inj. #1  Diagnostic right suprascapular NB #1    Completed:   Palliative bilateral lumbar facet MBB x2 (11/16/2020) (90/90/80/85-90)  Therapeutic left lumbar facet MBB x2 (10/10/2016) (100/100/100/100)  Therapeutic right lumbar facet MBB x1 (08/31/2015) (n/a)  Therapeutic right lumbar facet RFA x3 (05/08/2021) (100/100/50/50) on the right side Therapeutic left lumbar facet RFA x3 (03/22/2021) (100/100/99/99) on left side Palliative left C7-T1 CESI x1 (04/13/2015) (n/a)    Therapeutic  Palliative (PRN) options:   Palliative lumbar facet MBB  Palliative lumbar facet RFA  Palliative C7-T1 CESI  PRN SCS programming adjustments (2018 replacement by Dr. Odette Fraction)   Pharmacotherapy  Nonopioids transferred 04/06/2020: Robaxin         Recent Visits Date Type Provider Dept  08/19/22 Office Visit Delano Metz, MD [facility]  Showing recent visits within past 90 days and meeting all other requirements Future Appointments Date Type Provider Dept  10/23/22 Appointment Delano Metz, MD [facility]  Showing future appointments within next 90 days and meeting all other requirements  I discussed the assessment and treatment plan with the patient. The patient was provided an opportunity to ask questions and all were answered. The patient agreed with the plan and demonstrated an understanding of the instructions.  Patient  advised to call back or seek an in-person evaluation if the symptoms or condition worsens.  Duration of encounter: *** minutes.  Total time on encounter, as per AMA guidelines included both the face-to-face and non-face-to-face time personally spent by the physician and/or other qualified health care professional(s) on the day of the encounter (includes time in activities that require the physician or other qualified health care professional and does not include time in activities normally performed by clinical staff). Physician's time may include the following activities when performed: Preparing to see the patient (e.g., pre-charting review of records, searching for previously ordered imaging, lab work, and nerve conduction tests) Review of prior analgesic pharmacotherapies. Reviewing PMP Interpreting ordered tests (e.g., lab work, imaging, nerve conduction tests) Performing post-procedure evaluations, including interpretation of diagnostic procedures Obtaining and/or reviewing  separately obtained history Performing a medically appropriate examination and/or evaluation Counseling and educating the patient/family/caregiver Ordering medications, tests, or procedures Referring and communicating with other health care professionals (when not separately reported) Documenting clinical information in the electronic or other health record Independently interpreting results (not separately reported) and communicating results to the patient/ family/caregiver Care coordination (not separately reported)  Note by: Oswaldo Done, MD Date: 10/23/2022; Time: 6:02 AM

## 2022-10-23 ENCOUNTER — Encounter: Payer: Self-pay | Admitting: Pain Medicine

## 2022-10-23 ENCOUNTER — Ambulatory Visit: Payer: BC Managed Care – PPO | Attending: Pain Medicine | Admitting: Pain Medicine

## 2022-10-23 VITALS — BP 132/83 | HR 98 | Temp 98.8°F | Resp 16 | Ht 63.5 in | Wt 275.0 lb

## 2022-10-23 DIAGNOSIS — R42 Dizziness and giddiness: Secondary | ICD-10-CM | POA: Diagnosis not present

## 2022-10-23 DIAGNOSIS — M5459 Other low back pain: Secondary | ICD-10-CM | POA: Diagnosis not present

## 2022-10-23 DIAGNOSIS — M542 Cervicalgia: Secondary | ICD-10-CM | POA: Insufficient documentation

## 2022-10-23 DIAGNOSIS — M79601 Pain in right arm: Secondary | ICD-10-CM | POA: Diagnosis not present

## 2022-10-23 DIAGNOSIS — G894 Chronic pain syndrome: Secondary | ICD-10-CM | POA: Diagnosis not present

## 2022-10-23 DIAGNOSIS — M79602 Pain in left arm: Secondary | ICD-10-CM | POA: Insufficient documentation

## 2022-10-23 DIAGNOSIS — Z79899 Other long term (current) drug therapy: Secondary | ICD-10-CM | POA: Diagnosis not present

## 2022-10-23 DIAGNOSIS — G8929 Other chronic pain: Secondary | ICD-10-CM | POA: Insufficient documentation

## 2022-10-23 DIAGNOSIS — M47816 Spondylosis without myelopathy or radiculopathy, lumbar region: Secondary | ICD-10-CM | POA: Diagnosis not present

## 2022-10-23 DIAGNOSIS — G4731 Primary central sleep apnea: Secondary | ICD-10-CM | POA: Diagnosis not present

## 2022-10-23 DIAGNOSIS — M25551 Pain in right hip: Secondary | ICD-10-CM | POA: Insufficient documentation

## 2022-10-23 DIAGNOSIS — Z9189 Other specified personal risk factors, not elsewhere classified: Secondary | ICD-10-CM | POA: Diagnosis not present

## 2022-10-23 DIAGNOSIS — R55 Syncope and collapse: Secondary | ICD-10-CM | POA: Diagnosis not present

## 2022-10-23 DIAGNOSIS — M25552 Pain in left hip: Secondary | ICD-10-CM | POA: Insufficient documentation

## 2022-10-23 DIAGNOSIS — Z79891 Long term (current) use of opiate analgesic: Secondary | ICD-10-CM | POA: Diagnosis not present

## 2022-10-23 MED ORDER — BUPRENORPHINE 10 MCG/HR TD PTWK
1.0000 | MEDICATED_PATCH | TRANSDERMAL | 0 refills | Status: DC
Start: 2022-10-23 — End: 2023-01-12

## 2022-10-23 MED ORDER — BUPRENORPHINE 10 MCG/HR TD PTWK: 1.0000 | MEDICATED_PATCH | TRANSDERMAL | 0 refills | Status: DC

## 2022-10-23 MED ORDER — BUPRENORPHINE 10 MCG/HR TD PTWK
1.0000 | MEDICATED_PATCH | TRANSDERMAL | 0 refills | Status: DC
Start: 2022-12-18 — End: 2023-01-12

## 2022-10-23 NOTE — Patient Instructions (Signed)
____________________________________________________________________________________________  Opioid Pain Medication Update  To: All patients taking opioid pain medications. (I.e.: hydrocodone, hydromorphone, oxycodone, oxymorphone, morphine, codeine, methadone, tapentadol, tramadol, buprenorphine, fentanyl, etc.)  Re: Updated review of side effects and adverse reactions of opioid analgesics, as well as new information about long term effects of this class of medications.  Direct risks of long-term opioid therapy are not limited to opioid addiction and overdose. Potential medical risks include serious fractures, breathing problems during sleep, hyperalgesia, immunosuppression, chronic constipation, bowel obstruction, myocardial infarction, and tooth decay secondary to xerostomia.  Unpredictable adverse effects that can occur even if you take your medication correctly: Cognitive impairment, respiratory depression, and death. Most people think that if they take their medication "correctly", and "as instructed", that they will be safe. Nothing could be farther from the truth. In reality, a significant amount of recorded deaths associated with the use of opioids has occurred in individuals that had taken the medication for a long time, and were taking their medication correctly. The following are examples of how this can happen: Patient taking his/her medication for a long time, as instructed, without any side effects, is given a certain antibiotic or another unrelated medication, which in turn triggers a "Drug-to-drug interaction" leading to disorientation, cognitive impairment, impaired reflexes, respiratory depression or an untoward event leading to serious bodily harm or injury, including death.  Patient taking his/her medication for a long time, as instructed, without any side effects, develops an acute impairment of liver and/or kidney function. This will lead to a rapid inability of the body to  breakdown and eliminate their pain medication, which will result in effects similar to an "overdose", but with the same medicine and dose that they had always taken. This again may lead to disorientation, cognitive impairment, impaired reflexes, respiratory depression or an untoward event leading to serious bodily harm or injury, including death.  A similar problem will occur with patients as they grow older and their liver and kidney function begins to decrease as part of the aging process.  Background information: Historically, the original case for using long-term opioid therapy to treat chronic noncancer pain was based on safety assumptions that subsequent experience has called into question. In 1996, the American Pain Society and the American Academy of Pain Medicine issued a consensus statement supporting long-term opioid therapy. This statement acknowledged the dangers of opioid prescribing but concluded that the risk for addiction was low; respiratory depression induced by opioids was short-lived, occurred mainly in opioid-naive patients, and was antagonized by pain; tolerance was not a common problem; and efforts to control diversion should not constrain opioid prescribing. This has now proven to be wrong. Experience regarding the risks for opioid addiction, misuse, and overdose in community practice has failed to support these assumptions.  According to the Centers for Disease Control and Prevention, fatal overdoses involving opioid analgesics have increased sharply over the past decade. Currently, more than 96,700 people die from drug overdoses every year. Opioids are a factor in 7 out of every 10 overdose deaths. Deaths from drug overdose have surpassed motor vehicle accidents as the leading cause of death for individuals between the ages of 80 and 61.  Clinical data suggest that neuroendocrine dysfunction may be very common in both men and women, potentially causing hypogonadism, erectile  dysfunction, infertility, decreased libido, osteoporosis, and depression. Recent studies linked higher opioid dose to increased opioid-related mortality. Controlled observational studies reported that long-term opioid therapy may be associated with increased risk for cardiovascular events. Subsequent meta-analysis concluded  that the safety of long-term opioid therapy in elderly patients has not been proven.   Side Effects and adverse reactions: Common side effects: Drowsiness (sedation). Dizziness. Nausea and vomiting. Constipation. Physical dependence -- Dependence often manifests with withdrawal symptoms when opioids are discontinued or decreased. Tolerance -- As you take repeated doses of opioids, you require increased medication to experience the same effect of pain relief. Respiratory depression -- This can occur in healthy people, especially with higher doses. However, people with COPD, asthma or other lung conditions may be even more susceptible to fatal respiratory impairment.  Uncommon side effects: An increased sensitivity to feeling pain and extreme response to pain (hyperalgesia). Chronic use of opioids can lead to this. Delayed gastric emptying (the process by which the contents of your stomach are moved into your small intestine). Muscle rigidity. Immune system and hormonal dysfunction. Quick, involuntary muscle jerks (myoclonus). Arrhythmia. Itchy skin (pruritus). Dry mouth (xerostomia).  Long-term side effects: Chronic constipation. Sleep-disordered breathing (SDB). Increased risk of bone fractures. Hypothalamic-pituitary-adrenal dysregulation. Increased risk of overdose.  RISKS: Respiratory depression and death: Opioids increase the risk of respiratory depression and death.  Drug-to-drug interactions: Opioids are relatively contraindicated in combination with benzodiazepines, sleep inducers, and other central nervous system depressants. Other classes of medications  (i.e.: certain antibiotics and even over-the-counter medications) may also trigger or induce respiratory depression in some patients.  Medical conditions: Patients with pre-existing respiratory problems are at higher risk of respiratory failure and/or depression when in combination with opioid analgesics. Opioids are relatively contraindicated in some medical conditions such as central sleep apnea.   Fractures and Falls:  Opioids increase the risk and incidence of falls. This is of particular importance in elderly patients.  Endocrine System:  Long-term administration is associated with endocrine abnormalities (endocrinopathies). (Also known as Opioid-induced Endocrinopathy) Influences on both the hypothalamic-pituitary-adrenal axis?and the hypothalamic-pituitary-gonadal axis have been demonstrated with consequent hypogonadism and adrenal insufficiency in both sexes. Hypogonadism and decreased levels of dehydroepiandrosterone sulfate have been reported in men and women. Endocrine effects include: Amenorrhoea in women (abnormal absence of menstruation) Reduced libido in both sexes Decreased sexual function Erectile dysfunction in men Hypogonadisms (decreased testicular function with shrinkage of testicles) Infertility Depression and fatigue Loss of muscle mass Anxiety Depression Immune suppression Hyperalgesia Weight gain Anemia Osteoporosis Patients (particularly women of childbearing age) should avoid opioids. There is insufficient evidence to recommend routine monitoring of asymptomatic patients taking opioids in the long-term for hormonal deficiencies.  Immune System: Human studies have demonstrated that opioids have an immunomodulating effect. These effects are mediated via opioid receptors both on immune effector cells and in the central nervous system. Opioids have been demonstrated to have adverse effects on antimicrobial response and anti-tumour surveillance. Buprenorphine has  been demonstrated to have no impact on immune function.  Opioid Induced Hyperalgesia: Human studies have demonstrated that prolonged use of opioids can lead to a state of abnormal pain sensitivity, sometimes called opioid induced hyperalgesia (OIH). Opioid induced hyperalgesia is not usually seen in the absence of tolerance to opioid analgesia. Clinically, hyperalgesia may be diagnosed if the patient on long-term opioid therapy presents with increased pain. This might be qualitatively and anatomically distinct from pain related to disease progression or to breakthrough pain resulting from development of opioid tolerance. Pain associated with hyperalgesia tends to be more diffuse than the pre-existing pain and less defined in quality. Management of opioid induced hyperalgesia requires opioid dose reduction.  Cancer: Chronic opioid therapy has been associated with an increased risk of cancer  among noncancer patients with chronic pain. This association was more evident in chronic strong opioid users. Chronic opioid consumption causes significant pathological changes in the small intestine and colon. Epidemiological studies have found that there is a link between opium dependence and initiation of gastrointestinal cancers. Cancer is the second leading cause of death after cardiovascular disease. Chronic use of opioids can cause multiple conditions such as GERD, immunosuppression and renal damage as well as carcinogenic effects, which are associated with the incidence of cancers.   Mortality: Long-term opioid use has been associated with increased mortality among patients with chronic non-cancer pain (CNCP).  Prescription of long-acting opioids for chronic noncancer pain was associated with a significantly increased risk of all-cause mortality, including deaths from causes other than overdose.  Reference: Von Korff M, Kolodny A, Deyo RA, Chou R. Long-term opioid therapy reconsidered. Ann Intern Med. 2011  Sep 6;155(5):325-8. doi: 10.7326/[id number]. PMID: 64403474; PMCID: [id number]. Randon Goldsmith, Hayward RA, Dunn KM, [place] KP. Risk of adverse events in patients prescribed long-term opioids: A cohort study in the [place] [facility] Research Datalink. Eur J Pain. 2019 May;23(5):908-922. doi: 10.1002/ejp.1357. Epub 2019 Jan 31. PMID: 43329518. Colameco S, Coren JS, Ciervo CA. Continuous opioid treatment for chronic noncancer pain: a time for moderation in prescribing. Postgrad Med. 2009 Jul;121(4):61-6. doi: 10.3810/pgm.2009.07.2032. PMID: 84166063. William Hamburger RN, [place] SD, Blazina I, Cristopher Peru, Bougatsos C, Deyo RA. The effectiveness and risks of long-term opioid therapy for chronic pain: a systematic review for a [employer] of [employer] Pathways to [employer]. Ann Intern Med. 2015 Feb 17;162(4):276-86. doi: 10.7326/M14-2559. PMID: 01601093. Caryl Bis [facility], Makuc DM. NCHS Data Brief No. 22. [place]: Centers for Disease Control and Prevention; 2009. Sep, Increase in Fatal Poisonings Involving Opioid Analgesics in the [place], 1999-2006. Song IA, Choi HR, Oh TK. Long-term opioid use and mortality in patients with chronic non-cancer pain: Ten-year follow-up study in [place] from 2010 through 2019. EClinicalMedicine. 2022 Jul 18;51:101558. doi: 10.1016/j.eclinm.2022.235573. PMID: 22025427; PMCID: [id number]. Huser, W., Schubert, T., Vogelmann, T. et al. All-cause mortality in patients with long-term opioid therapy compared with non-opioid analgesics for chronic non-cancer pain: a database study. BMC Med 18, 162 (2020). http://lester.info/ Rashidian H, Karie Kirks, Malekzadeh R, Haghdoost AA. An Ecological Study of the Association between Opiate Use and Incidence of Cancers. Addict Health. 2016 Fall;8(4):252-260. PMID: 51761607; PMCID: [id number].  Our Goal: Our goal is to control your  pain with means other than the use of opioid pain medications.  Our Recommendation: Talk to your physician about coming off of these medications. We can assist you with the tapering down and stopping these medicines. Based on the new information, even if you cannot completely stop the medication, a decrease in the dose may be associated with a lesser risk. Ask for other means of controlling the pain. Decrease or eliminate those factors that significantly contribute to your pain such as smoking, obesity, and a diet heavily tilted towards "inflammatory" nutrients.  Last Updated: 08/19/2022   ____________________________________________________________________________________________     ____________________________________________________________________________________________  National Pain Medication Shortage  The [place] is experiencing worsening drug shortages. These have had a negative widespread effect on patient care and treatment. Not expected to improve any time soon. Predicted to last past 2029.   Drug shortage list (generic names) Oxycodone IR Oxycodone/APAP Oxymorphone IR Hydromorphone Hydrocodone/APAP Morphine  Where is the problem?  Manufacturing and supply level.  Will this shortage affect you?  Only if you  take any of the above pain medications.  How? You may be unable to fill your prescription.  Your pharmacist may offer a "partial fill" of your prescription. (Warning: Do not accept partial fills.) Prescriptions partially filled cannot be transferred to another pharmacy. Read our Medication Rules and Regulation. Depending on how much medicine you are dependent on, you may experience withdrawals when unable to get the medication.  Recommendations: Consider ending your dependence on opioid pain medications. Ask your pain specialist to assist you with the process. Consider switching to a medication currently not in shortage, such as Buprenorphine. Talk to your pain  specialist about this option. Consider decreasing your pain medication requirements by managing tolerance thru "Drug Holidays". This may help minimize withdrawals, should you run out of medicine. Control your pain thru the use of non-pharmacological interventional therapies.   Your prescriber: Prescribers cannot be blamed for shortages. Medication manufacturing and supply issues cannot be fixed by the prescriber.   NOTE: The prescriber is not responsible for supplying the medication, or solving supply issues. Work with your pharmacist to solve it. The patient is responsible for the decision to take or continue taking the medication and for identifying and securing a legal supply source. By law, supplying the medication is the job and responsibility of the pharmacy. The prescriber is responsible for the evaluation, monitoring, and prescribing of these medications.   Prescribers will NOT: Re-issue prescriptions that have been partially filled. Re-issue prescriptions already sent to a pharmacy.  Re-send prescriptions to a different pharmacy because yours did not have your medication. Ask pharmacist to order more medicine or transfer the prescription to another pharmacy. (Read below.)  New 2023 regulation: "October 12, 2021 Revised Regulation Allows DEA-Registered Pharmacies to Transfer Electronic Prescriptions at a Patient's Request DEA Headquarters Division - Public Information Office Patients now have the ability to request their electronic prescription be transferred to another pharmacy without having to go back to their practitioner to initiate the request. This revised regulation went into effect on Monday, October 08, 2021.     At a patient's request, a DEA-registered retail pharmacy can now transfer an electronic prescription for a controlled substance (schedules II-V) to another DEA-registered retail pharmacy. Prior to this change, patients would have to go through their practitioner to  cancel their prescription and have it re-issued to a different pharmacy. The process was taxing and time consuming for both patients and practitioners.    The Drug Enforcement Administration [facility]) published its intent to revise the process for transferring electronic prescriptions on December 31, 2019.  The final rule was published in the federal register on September 06, 2021 and went into effect 30 days later.  Under the final rule, a prescription can only be transferred once between pharmacies, and only if allowed under existing state or other applicable law. The prescription must remain in its electronic form; may not be altered in any way; and the transfer must be communicated directly between two licensed pharmacists. It's important to note, any authorized refills transfer with the original prescription, which means the entire prescription will be filled at the same pharmacy".  Reference: HugeHand.is [facility] website announcement)  [email or web address] [employer] of Justice)   [employer] / Vol. 88, No. 143 / Thursday, September 06, 2021 / Rules and Regulations DEPARTMENT OF JUSTICE  Drug Enforcement Administration  21 CFR Part 1306  [Docket No. [id number]]  RIN [id number] Transfer of Electronic Prescriptions for Schedules II-V Controlled Substances Between Pharmacies for Initial Filling  ____________________________________________________________________________________________  ____________________________________________________________________________________________  Transfer of Pain Medication between Pharmacies  Re: 2023 DEA Clarification on existing regulation  Published on DEA Website: October 12, 2021  Title: Revised Regulation Allows DEA-Registered Pharmacies to Electrical engineer Prescriptions at a Patient's  Request DEA Headquarters Division - [employer]  "Patients now have the ability to request their electronic prescription be transferred to another pharmacy without having to go back to their practitioner to initiate the request. This revised regulation went into effect on Monday, October 08, 2021.     At a patient's request, a DEA-registered retail pharmacy can now transfer an electronic prescription for a controlled substance (schedules II-V) to another DEA-registered retail pharmacy. Prior to this change, patients would have to go through their practitioner to cancel their prescription and have it re-issued to a different pharmacy. The process was taxing and time consuming for both patients and practitioners.    The Drug Enforcement Administration [facility]) published its intent to revise the process for transferring electronic prescriptions on December 31, 2019.  The final rule was published in the federal register on September 06, 2021 and went into effect 30 days later.  Under the final rule, a prescription can only be transferred once between pharmacies, and only if allowed under existing state or other applicable law. The prescription must remain in its electronic form; may not be altered in any way; and the transfer must be communicated directly between two licensed pharmacists. It's important to note, any authorized refills transfer with the original prescription, which means the entire prescription will be filled at the same pharmacy."    REFERENCES: 1. DEA website announcement [email or web address]  2. Department of Justice website  [email or web address].pdf  3. DEPARTMENT OF JUSTICE Drug Enforcement Administration 21 CFR Part 1306 [Docket No. [id number]] RIN [id number] "Transfer of Electronic Prescriptions for Schedules II-V Controlled Substances  Between Pharmacies for Initial Filling"  ____________________________________________________________________________________________     _______________________________________________________________________  Medication Rules  Purpose: To inform patients, and their family members, of our medication rules and regulations.  Applies to: All patients receiving prescriptions from our practice (written or electronic).  Pharmacy of record: This is the pharmacy where your electronic prescriptions will be sent. Make sure we have the correct one.  Electronic prescriptions: In compliance with the [facility] Strengthen Opioid Misuse Prevention (STOP) Act of 2017 (Session Conni Elliot [phone]), effective February 11, 2018, all controlled substances must be electronically prescribed. Written prescriptions, faxing, or calling prescriptions to a pharmacy will no longer be done.  Prescription refills: These will be provided only during in-person appointments. No medications will be renewed without a "face-to-face" evaluation with your provider. Applies to all prescriptions.  NOTE: The following applies primarily to controlled substances (Opioid* Pain Medications).   Type of encounter (visit): For patients receiving controlled substances, face-to-face visits are required. (Not an option and not up to the patient.)  Patient's responsibilities: Pain Pills: Bring all pain pills to every appointment (except for procedure appointments). Pill Bottles: Bring pills in original pharmacy bottle. Bring bottle, even if empty. Always bring the bottle of the most recent fill.  Medication refills: You are responsible for knowing and keeping track of what medications you are taking and when is it that you will need a refill. The day before your appointment: write a list of all prescriptions that need to be refilled. The day of the appointment: give the list to the admitting nurse. Prescriptions will be written only  during appointments. No prescriptions will be written on procedure days. If you forget a  medication: it will not be "Called in", "Faxed", or "electronically sent". You will need to get another appointment to get these prescribed. No early refills. Do not call asking to have your prescription filled early. Partial  or short prescriptions: Occasionally your pharmacy may not have enough pills to fill your prescription.  NEVER ACCEPT a partial fill or a prescription that is short of the total amount of pills that you were prescribed.  With controlled substances the law allows 72 hours for the pharmacy to complete the prescription.  If the prescription is not completed within 72 hours, the pharmacist will require a new prescription to be written. This means that you will be short on your medicine and we WILL NOT send another prescription to complete your original prescription.  Instead, request the pharmacy to send a carrier to a nearby branch to get enough medication to provide you with your full prescription. Prescription Accuracy: You are responsible for carefully inspecting your prescriptions before leaving our office. Have the discharge nurse carefully go over each prescription with you, before taking them home. Make sure that your name is accurately spelled, that your address is correct. Check the name and dose of your medication to make sure it is accurate. Check the number of pills, and the written instructions to make sure they are clear and accurate. Make sure that you are given enough medication to last until your next medication refill appointment. Taking Medication: Take medication as prescribed. When it comes to controlled substances, taking less pills or less frequently than prescribed is permitted and encouraged. Never take more pills than instructed. Never take the medication more frequently than prescribed.  Inform other Doctors: Always inform, all of your healthcare providers, of all the  medications you take. Pain Medication from other Providers: You are not allowed to accept any additional pain medication from any other Doctor or Healthcare provider. There are two exceptions to this rule. (see below) In the event that you require additional pain medication, you are responsible for notifying us, as stated below. Cough Medicine: Often these contain an opioid, such as codeine or hydrocodone. Never accept or take cough medicine containing these opioids if you are already taking an opioid* medication. The combination may cause respiratory failure and death. Medication Agreement: You are responsible for carefully reading and following our Medication Agreement. This must be signed before receiving any prescriptions from our practice. Safely store a copy of your signed Agreement. Violations to the Agreement will result in no further prescriptions. (Additional copies of our Medication Agreement are available upon request.) Laws, Rules, & Regulations: All patients are expected to follow all [address] and [employer], [employer], Rules, [place] [place]. Ignorance of the Laws does not constitute a valid excuse.  Illegal drugs and Controlled Substances: The use of illegal substances (including, but not limited to marijuana and its derivatives) and/or the illegal use of any controlled substances is strictly prohibited. Violation of this rule may result in the immediate and permanent discontinuation of any and all prescriptions being written by our practice. The use of any illegal substances is prohibited. Adopted CDC guidelines & recommendations: Target dosing levels will be at or below 60 MME/day. Use of benzodiazepines** is not recommended.  Exceptions: There are only two exceptions to the rule of not receiving pain medications from other Healthcare Providers. Exception #1 (Emergencies): In the event of an emergency (i.e.: accident requiring emergency care), you are allowed to receive additional pain  medication. However, you are responsible for: As soon as  you are able, call our office [phone], at any time of the day or night, and leave a message stating your name, the date and nature of the emergency, and the name and dose of the medication prescribed. In the event that your call is answered by a member of our staff, make sure to document and save the date, time, and the name of the person that took your information.  Exception #2 (Planned Surgery): In the event that you are scheduled by another doctor or dentist to have any type of surgery or procedure, you are allowed (for a period no longer than 30 days), to receive additional pain medication, for the acute post-op pain. However, in this case, you are responsible for picking up a copy of our "Post-op Pain Management for Surgeons" handout, and giving it to your surgeon or dentist. This document is available at our office, and does not require an appointment to obtain it. Simply go to our office during business hours (Monday-Thursday from 8:00 AM to 4:00 PM) (Friday 8:00 AM to 12:00 Noon) or if you have a scheduled appointment with [place], prior to your surgery, and ask for it by name. In addition, you are responsible for: calling our office [phone], at any time of the day or night, and leaving a message stating your name, name of your surgeon, type of surgery, and date of procedure or surgery. Failure to comply with your responsibilities may result in termination of therapy involving the controlled substances. Medication Agreement Violation. Following the above rules, including your responsibilities will help you in avoiding a Medication Agreement Violation ("Breaking your Pain Medication Contract").  Consequences:  Not following the above rules may result in permanent discontinuation of medication prescription therapy.  *Opioid medications include: morphine, codeine, oxycodone, oxymorphone, hydrocodone, hydromorphone, meperidine, tramadol,  tapentadol, buprenorphine, fentanyl, methadone. **Benzodiazepine medications include: diazepam (Valium), alprazolam (Xanax), clonazepam (Klonopine), lorazepam (Ativan), clorazepate (Tranxene), chlordiazepoxide (Librium), estazolam (Prosom), oxazepam (Serax), temazepam (Restoril), triazolam (Halcion) (Last updated: 12/04/2021) ______________________________________________________________________    ______________________________________________________________________  Medication Recommendations and Reminders  Applies to: All patients receiving prescriptions (written and/or electronic).  Medication Rules & Regulations: You are responsible for reading, knowing, and following our "Medication Rules" document. These exist for your safety and that of others. They are not flexible and neither are we. Dismissing or ignoring them is an act of "non-compliance" that may result in complete and irreversible termination of such medication therapy. For safety reasons, "non-compliance" will not be tolerated. As with the [place] fundamental legal principle of "ignorance of the law is no defense", we will accept no excuses for not having read and knowing the content of documents provided to you by our practice.  Pharmacy of record:  Definition: This is the pharmacy where your electronic prescriptions will be sent.  We do not endorse any particular pharmacy. It is up to you and your insurance to decide what pharmacy to use.  We do not restrict you in your choice of pharmacy. However, once we write for your prescriptions, we will NOT be re-sending more prescriptions to fix restricted supply problems created by your pharmacy, or your insurance.  The pharmacy listed in the electronic medical record should be the one where you want electronic prescriptions to be sent. If you choose to change pharmacy, simply notify our nursing staff. Changes will be made only during your regular appointments and not over the  phone.  Recommendations: Keep all of your pain medications in a safe place, under lock and key, even  if you live alone. We will NOT replace lost, stolen, or damaged medication. We do not accept "Police Reports" as proof of medications having been stolen. After you fill your prescription, take 1 week's worth of pills and put them away in a safe place. You should keep a separate, properly labeled bottle for this purpose. The remainder should be kept in the original bottle. Use this as your primary supply, until it runs out. Once it's gone, then you know that you have 1 week's worth of medicine, and it is time to come in for a prescription refill. If you do this correctly, it is unlikely that you will ever run out of medicine. To make sure that the above recommendation works, it is very important that you make sure your medication refill appointments are scheduled at least 1 week before you run out of medicine. To do this in an effective manner, make sure that you do not leave the office without scheduling your next medication management appointment. Always ask the nursing staff to show you in your prescription , when your medication will be running out. Then arrange for the receptionist to get you a return appointment, at least 7 days before you run out of medicine. Do not wait until you have 1 or 2 pills left, to come in. This is very poor planning and does not take into consideration that we may need to cancel appointments due to bad weather, sickness, or emergencies affecting our staff. DO NOT ACCEPT A "Partial Fill": If for any reason your pharmacy does not have enough pills/tablets to completely fill or refill your prescription, do not allow for a "partial fill". The law allows the pharmacy to complete that prescription within 72 hours, without requiring a new prescription. If they do not fill the rest of your prescription within those 72 hours, you will need a separate prescription to fill the remaining  amount, which we will NOT provide. If the reason for the partial fill is your insurance, you will need to talk to the pharmacist about payment alternatives for the remaining tablets, but again, DO NOT ACCEPT A PARTIAL FILL, unless you can trust your pharmacist to obtain the remainder of the pills within 72 hours.  Prescription refills and/or changes in medication(s):  Prescription refills, and/or changes in dose or medication, will be conducted only during scheduled medication management appointments. (Applies to both, written and electronic prescriptions.) No refills on procedure days. No medication will be changed or started on procedure days. No changes, adjustments, and/or refills will be conducted on a procedure day. Doing so will interfere with the diagnostic portion of the procedure. No phone refills. No medications will be "called into the pharmacy". No Fax refills. No weekend refills. No Holliday refills. No after hours refills.  Remember:  Business hours are:  Monday to Thursday 8:00 AM to 4:00 PM Provider's Schedule: Delano Metz, MD - Appointments are:  Medication management: Monday and Wednesday 8:00 AM to 4:00 PM Procedure day: Tuesday and Thursday 7:30 AM to 4:00 PM Edward Jolly, MD - Appointments are:  Medication management: Tuesday and Thursday 8:00 AM to 4:00 PM Procedure day: Monday and Wednesday 7:30 AM to 4:00 PM (Last update: 12/04/2021) ______________________________________________________________________   ____________________________________________________________________________________________  Naloxone Nasal Spray  Why am I receiving this medication? [place] STOP ACT requires that all patients taking high dose opioids or at risk of opioids respiratory depression, be prescribed an opioid reversal agent, such as Naloxone (AKA: Narcan).  What is this medication? NALOXONE (  nal OX one) treats opioid overdose, which causes slow or shallow breathing,  severe drowsiness, or trouble staying awake. Call emergency services after using this medication. You may need additional treatment. Naloxone works by reversing the effects of opioids. It belongs to a group of medications called opioid blockers.  COMMON BRAND NAME(S): Kloxxado, Narcan  What should I tell my care team before I take this medication? They need to know if you have any of these conditions: Heart disease Substance use disorder An unusual or allergic reaction to naloxone, other medications, foods, dyes, or preservatives Pregnant or trying to get pregnant Breast-feeding  When to use this medication? This medication is to be used for the treatment of respiratory depression (less than 8 breaths per minute) secondary to opioid overdose.   How to use this medication? This medication is for use in the nose. Lay the person on their back. Support their neck with your hand and allow the head to tilt back before giving the medication. The nasal spray should be given into 1 nostril. After giving the medication, move the person onto their side. Do not remove or test the nasal spray until ready to use. Get emergency medical help right away after giving the first dose of this medication, even if the person wakes up. You should be familiar with how to recognize the signs and symptoms of a narcotic overdose. If more doses are needed, give the additional dose in the other nostril. Talk to your care team about the use of this medication in children. While this medication may be prescribed for children as young as newborns for selected conditions, precautions do apply.  Naloxone Overdosage: If you think you have taken too much of this medicine contact a poison control center or emergency room at once.  NOTE: This medicine is only for you. Do not share this medicine with others.  What if I miss a dose? This does not apply.  What may interact with this medication? This is only used during an  emergency. No interactions are expected during emergency use. This list may not describe all possible interactions. Give your health care provider a list of all the medicines, herbs, non-prescription drugs, or dietary supplements you use. Also tell them if you smoke, drink alcohol, or use illegal drugs. Some items may interact with your medicine.  What should I watch for while using this medication? Keep this medication ready for use in the case of an opioid overdose. Make sure that you have the phone number of your care team and local hospital ready. You may need to have additional doses of this medication. Each nasal spray contains a single dose. Some emergencies may require additional doses. After use, bring the treated person to the nearest hospital or call 911. Make sure the treating care team knows that the person has received a dose of this medication. You will receive additional instructions on what to do during and after use of this medication before an emergency occurs.  What side effects may I notice from receiving this medication? Side effects that you should report to your care team as soon as possible: Allergic reactions--skin rash, itching, hives, swelling of the face, lips, tongue, or throat Side effects that usually do not require medical attention (report these to your care team if they continue or are bothersome): Constipation Dryness or irritation inside the nose Headache Increase in blood pressure Muscle spasms Stuffy nose Toothache This list may not describe all possible side effects. Call your doctor for  medical advice about side effects. You may report side effects to FDA at 1-800-FDA-1088.  Where should I keep my medication? Because this is an emergency medication, you should keep it with you at all times.  Keep out of the reach of children and pets. Store between 20 and 25 degrees C (68 and 77 degrees F). Do not freeze. Throw away any unused medication after the  expiration date. Keep in original box until ready to use.  NOTE: This sheet is a summary. It may not cover all possible information. If you have questions about this medicine, talk to your doctor, pharmacist, or health care provider.   2023 Elsevier/Gold Standard (2020-10-06 00:00:00)  ____________________________________________________________________________________________

## 2022-10-23 NOTE — Progress Notes (Signed)
Nursing Pain Medication Assessment:  Safety precautions to be maintained throughout the outpatient stay will include: orient to surroundings, keep bed in low position, maintain call bell within reach at all times, provide assistance with transfer out of bed and ambulation.  Medication Inspection Compliance: Pill count conducted under aseptic conditions, in front of the patient. Neither the pills nor the bottle was removed from the patient's sight at any time. Once count was completed pills were immediately returned to the patient in their original bottle.  Medication: Buprenorphine (Suboxone) Pill/Patch Count:  3 of 4 pills remain Pill/Patch Appearance: Markings consistent with prescribed medication Bottle Appearance: Standard pharmacy container. Clearly labeled. Filled Date: 44 / 77 / 2024 Last Medication intake:  Day before yesterday

## 2022-10-24 ENCOUNTER — Other Ambulatory Visit (HOSPITAL_COMMUNITY)
Admission: RE | Admit: 2022-10-24 | Discharge: 2022-10-24 | Disposition: A | Discharge status: Home / Self Care | Payer: BC Managed Care – PPO | Source: Ambulatory Visit | Attending: Medical-Surgical | Admitting: Medical-Surgical

## 2022-10-24 ENCOUNTER — Ambulatory Visit: Payer: BC Managed Care – PPO | Admitting: Medical-Surgical

## 2022-10-24 ENCOUNTER — Encounter: Payer: Self-pay | Admitting: Medical-Surgical

## 2022-10-24 VITALS — BP 121/78 | HR 100 | Resp 20 | Ht 63.5 in | Wt 276.9 lb

## 2022-10-24 DIAGNOSIS — R21 Rash and other nonspecific skin eruption: Secondary | ICD-10-CM

## 2022-10-24 DIAGNOSIS — Z124 Encounter for screening for malignant neoplasm of cervix: Secondary | ICD-10-CM

## 2022-10-24 DIAGNOSIS — F1120 Opioid dependence, uncomplicated: Secondary | ICD-10-CM | POA: Diagnosis not present

## 2022-10-24 DIAGNOSIS — K219 Gastro-esophageal reflux disease without esophagitis: Secondary | ICD-10-CM

## 2022-10-24 DIAGNOSIS — M4696 Unspecified inflammatory spondylopathy, lumbar region: Secondary | ICD-10-CM

## 2022-10-24 MED ORDER — FAMOTIDINE 20 MG PO TABS: 20.0000 mg | ORAL_TABLET | Freq: Two times a day (BID) | ORAL | 1 refills | Status: DC

## 2022-10-24 MED ORDER — PANTOPRAZOLE SODIUM 40 MG PO TBEC
DELAYED_RELEASE_TABLET | ORAL | 0 refills | Status: DC
Start: 2022-10-24 — End: 2022-12-31

## 2022-10-24 MED ORDER — CLOTRIMAZOLE-BETAMETHASONE 1-0.05 % EX CREA: 1.0000 | TOPICAL_CREAM | Freq: Every day | CUTANEOUS | 1 refills | Status: AC

## 2022-10-24 NOTE — Progress Notes (Signed)
        Established patient visit  History, exam, impression, and plan:  1. Unspecified inflammatory spondylopathy, lumbar region (HCC) 2. Uncomplicated opioid dependence [facility]) Pleasant 47 year old female presenting today with a history of opioid dependence as well as inflammatory spondylopathy causing severe chronic pain.  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  Up-to-date on follow-ups with their office and is compliant with directions.  3. Gastroesophageal reflux disease, unspecified whether esophagitis present Continues to have significant issues with reflux.  She is currently taking 40 mg of Protonix once daily and famotidine 20 mg twice daily.  Having residual/breakthrough symptoms despite her therapy.  Would like to try higher dose of the medication at this point.  I did advise her that high-dose use of Protonix is not recommended for long-term.  We will go up to Protonix 40 mg twice daily for 2 weeks then resume daily dosing.  Continue famotidine 20 mg twice daily.  Since she is still having breakthrough symptoms even with this therapy, referring to GI for possible endoscopy and discussion of surgical options for management of reflux.  May be a good candidate for the TIF procedure. - Ambulatory referral to Gastroenterology  4. Rash of foot On exam today, she has some peeling skin and papular rashes noted to bilateral feet and toes.  Has not been using anything on her this rash outside of moisturizing lotions and Vaseline.  Notes that he gets very itchy at times.  Plan to treat for possible dyshidrotic eczema versus athlete's foot.  Adding Lotrisone topically twice daily for 14 days.  5. Cervical cancer screening Pap smear with HPV cotesting completed today.  Denies concern for STIs so no testing performed. - Cytology - PAP   Procedures performed this visit: None.  Return in about 6 months  (around 04/23/2023) for chronic disease follow up.  __________________________________ Thayer Ohm, DNP, APRN, FNP-BC Primary Care and Sports Medicine [facility]

## 2022-10-25 LAB — CYTOLOGY - PAP
Adequacy: ABSENT
Comment: NEGATIVE
Diagnosis: NEGATIVE
High risk HPV: NEGATIVE

## 2022-10-28 ENCOUNTER — Encounter: Payer: Self-pay | Admitting: Medical-Surgical

## 2022-10-28 DIAGNOSIS — R110 Nausea: Secondary | ICD-10-CM

## 2022-10-29 DIAGNOSIS — M25561 Pain in right knee: Secondary | ICD-10-CM | POA: Diagnosis not present

## 2022-10-29 DIAGNOSIS — M7061 Trochanteric bursitis, right hip: Secondary | ICD-10-CM | POA: Diagnosis not present

## 2022-10-29 LAB — TOXASSURE SELECT 13 (MW), URINE

## 2022-10-29 MED ORDER — MECLIZINE HCL 25 MG PO TABS: 25.0000 mg | ORAL_TABLET | Freq: Three times a day (TID) | ORAL | 1 refills | Status: AC | PRN

## 2022-11-04 ENCOUNTER — Encounter: Payer: BC Managed Care – PPO | Admitting: Pain Medicine

## 2022-11-07 ENCOUNTER — Other Ambulatory Visit: Payer: Self-pay | Admitting: Medical-Surgical

## 2022-11-07 ENCOUNTER — Ambulatory Visit: Payer: BC Managed Care – PPO

## 2022-11-07 ENCOUNTER — Encounter: Payer: Self-pay | Admitting: Medical-Surgical

## 2022-11-07 NOTE — Telephone Encounter (Signed)
Requesting rx rf of  ropinerole Last written 10/06/2022 Last OV 10/24/2022 Next schdeuled 04/24/2023

## 2022-11-08 MED ORDER — ROPINIROLE HCL 0.25 MG PO TABS: 0.2500 mg | ORAL_TABLET | Freq: Every day | ORAL | 0 refills | Status: DC

## 2022-11-13 ENCOUNTER — Encounter: Payer: Self-pay | Admitting: Medical-Surgical

## 2022-11-14 DIAGNOSIS — M25551 Pain in right hip: Secondary | ICD-10-CM | POA: Diagnosis not present

## 2022-11-20 ENCOUNTER — Encounter: Payer: Self-pay | Admitting: Medical-Surgical

## 2022-11-22 ENCOUNTER — Ambulatory Visit: Payer: BC Managed Care – PPO

## 2022-11-22 DIAGNOSIS — R55 Syncope and collapse: Secondary | ICD-10-CM

## 2022-11-23 LAB — ECHOCARDIOGRAM COMPLETE
Area-P 1/2: 3.65 cm2
P 1/2 time: 1137 ms
S' Lateral: 3.2 cm

## 2022-11-25 DIAGNOSIS — I351 Nonrheumatic aortic (valve) insufficiency: Secondary | ICD-10-CM

## 2022-11-25 DIAGNOSIS — I4711 Inappropriate sinus tachycardia, so stated: Secondary | ICD-10-CM

## 2022-11-25 HISTORY — DX: Nonrheumatic aortic (valve) insufficiency: I35.1

## 2022-11-25 HISTORY — DX: Inappropriate sinus tachycardia, so stated: I47.11

## 2022-11-26 MED ORDER — METOPROLOL TARTRATE 25 MG PO TABS
12.5000 mg | ORAL_TABLET | Freq: Two times a day (BID) | ORAL | 3 refills | Status: DC
Start: 2022-11-26 — End: 2023-09-24

## 2022-11-28 NOTE — Progress Notes (Signed)
Virtual Visit via Video Note  I connected with Mary Cruz on 12/02/22 at 10:45 AM EDT by a video enabled telemedicine application and verified that I am speaking with the correct person using two identifiers.  Location: Patient: at home Provider: in office   I discussed the limitations of evaluation and management by telemedicine and the availability of in person appointments. The patient expressed understanding and agreed to proceed.    ASSESSMENT AND PLAN  Mary Cruz is a 47 y.o. female     Chronic migraine headaches  Recommend increasing nortriptyline to 30 mg nightly, migraines still 8/month although improved some  Recommend trying naratriptan as needed for rescue, can repeat after 4 hrs if needed.   Previously tried/failed: Sumatriptan(oral and injection), rizatriptan, topiramate contraindicated d/t hx of kidney stones  Severe central sleep apnea  Related to narcotic/opiate use  Not treatable with CPAP or BiPAP  Continue to follow with pain management   Follow-up in 6 months or call earlier if needed     DIAGNOSTIC DATA (LABS, IMAGING, TESTING) - I reviewed patient records, labs, notes, testing and imaging myself where available.   MEDICAL HISTORY:  Update 12/02/2022 Mary Cruz: Patient returns for follow-up visit via MyChart video visit. Reports improvement of migraines on nortriptyline 20 mg nightly but still occurring about 8 days per month, using sumatriptan injection causing worsening headache initially after taking (feels a head rush) but will eventually resolve after laying down. Also uses zofran which helps nausea.   Evaluated by Dr. Vickey Huger for concern of underlying sleep apnea back in May, underwent split-night study in June which showed severe central sleep apnea with AHI 55/h, CPAP provoking more central apneas therefore switched to BiPAP and eventually BiPAP ST but no time during titration attempt was there is significant reduction in AHI.   Central sleep apnea strongly related to use of narcotics/opiates which cannot be treated under CPAP or BiPAP.  No further recommendations from sleep clinic, recommended follow-up with pain management to wean off medications.  She has since had follow-up with pain management, now completely off oxycodone and using buprenorphine transdermal patch which she feels has been controlling her pain better.       UPDATE May 15 2022 Dr. Terrace Arabia: Continue complains of bilateral hand and lower extremity paresthesia, denies gait abnormality, loud snoring, frequent awakening, poor sleep quality, tolerating nortriptyline 20 mg every night, seems to sleep better, help her headache some.  But she continues to have migraine headache 2-3 times each week, lateralized severe pounding headache with light noise sensitivity, lasting few hours to couple days, Maxalt only work part of the time, take longer to kick in  Previous Imitrex injection was helpful  EMG nerve conduction study today showed no evidence of large fiber peripheral neuropathy, no lumbar sacral radiculopathy, mild bilateral carpal tunnel syndromes  Consult visit 02/05/2022 Dr. Terrace Arabia Mary Cruz is a 47 year old female, seen in request by her primary care [facility] nurse practitioner Christen Butter, for evaluation of bilateral hands, feet paresthesia, initial evaluation was on February 05, 2022  I reviewed and summarized the referring note. PMHx Depression, anxiety HTN Kidney stone. Chronic low back pain, under pain management, s/p spinal cord stimulator placement in May 2018, oxycodone 5mg  x4 tabs a day prn. Chronic migraine Quit smoke since Nov 2023.  She had long history of headaches, getting worse over the past few years, despite taking Topamax 50 mg every night, she has migraine once or twice each week,  lateralized severe pounding headache with light noise sensitivity, Maxalt 10 mg as needed was helpful, but she has to take second dose  50% of the time, sleep always helps  She complains of few months history of numbness tingling, involving all 5 fingers on both hands, to the point she dropped things from her hands, sometimes woke her up from sleep, have to shake her hands to make the sensation come back  Over the past few months, she also experiences constant numbness tingling at the bottom of her feet  She has mild gait abnormality due to previous history of right ankle fracture, required surgery, continue to have discomfort  She denies bowel bladder incontinence       PHYSICAL EXAM: N/A d/t visit type     REVIEW OF SYSTEMS:  Full 14 system review of systems performed and notable only for as above All other review of systems were negative.   ALLERGIES: Allergies  Allergen Reactions   Celecoxib Swelling    "Body swelling"   Doxycycline Other (See Comments) and Nausea And Vomiting    "SICKNESS"   Gabapentin Swelling    Whole body swelling   Latex Itching and Swelling   Nabumetone Itching   Pentazocine Lactate Itching and Other (See Comments)    HALLUCINATIONS   Seroquel [Quetiapine] Other (See Comments)    Falls   Amoxicillin Diarrhea and Nausea And Vomiting   Bactrim [Sulfamethoxazole-Trimethoprim] Other (See Comments)    "Makes very sick"   Cephalexin Other (See Comments) and Hives    Nausea and causes uti's   Erythromycin Other (See Comments)    RESULTANT UTI   Hydromorphone Hcl Other (See Comments)    HALLUCINATIONS   Lyrica [Pregabalin] Other (See Comments)    "Too sleepy"    HOME MEDICATIONS: Current Outpatient Medications  Medication Sig Dispense Refill   albuterol (PROVENTIL) (2.5 MG/3ML) 0.083% nebulizer solution Take 3 mLs (2.5 mg total) by nebulization every 6 (six) hours as needed for wheezing or shortness of breath. 150 mL 1   albuterol (VENTOLIN HFA) 108 (90 Base) MCG/ACT inhaler Inhale 2 puffs into the lungs every 6 (six) hours as needed for wheezing or shortness of breath. 8 g  2   budesonide (PULMICORT) 0.25 MG/2ML nebulizer solution USE 2 ML(0.25 MG) VIA NEBULIZER IN THE MORNING AND AT BEDTIME 60 mL 0   buprenorphine (BUTRANS) 10 MCG/HR PTWK Place 1 patch onto the skin once a week for 28 days. 4 patch 0   buprenorphine (BUTRANS) 10 MCG/HR PTWK Place 1 patch onto the skin once a week for 28 days. 4 patch 0   [START ON 12/18/2022] buprenorphine (BUTRANS) 10 MCG/HR PTWK Place 1 patch onto the skin once a week for 28 days. 4 patch 0   busPIRone (BUSPAR) 5 MG tablet Take 1-3 tablets (5-15 mg total) by mouth 2 (two) times daily as needed. 180 tablet 1   clotrimazole-betamethasone (LOTRISONE) cream Apply 1 Application topically daily. Use up to 14 days 45 g 1   DULoxetine (CYMBALTA) 60 MG capsule Take 60 mg by mouth daily.     famotidine (PEPCID) 20 MG tablet Take 1 tablet (20 mg total) by mouth 2 (two) times daily. 180 tablet 1   Fezolinetant (VEOZAH) 45 MG TABS Take 1 tablet (45 mg total) by mouth daily. 90 tablet 1   hyoscyamine (LEVSIN SL) 0.125 MG SL tablet Place 1 tablet (0.125 mg total) under the tongue daily as needed. 90 tablet 3   levocetirizine (XYZAL) 5 MG tablet Take  1 tablet (5 mg total) by mouth every evening. 90 tablet 3   lisinopril-hydrochlorothiazide (ZESTORETIC) 10-12.5 MG tablet TAKE 1 TABLET BY MOUTH DAILY 90 tablet 1   meclizine (ANTIVERT) 25 MG tablet Take 1 tablet (25 mg total) by mouth 3 (three) times daily as needed for dizziness. 90 tablet 1   methocarbamol (ROBAXIN) 750 MG tablet Take 1 tablet (750 mg total) by mouth every 8 (eight) hours as needed for muscle spasms. 270 tablet 3   metoprolol tartrate (LOPRESSOR) 25 MG tablet Take 0.5 tablets (12.5 mg total) by mouth 2 (two) times daily. 90 tablet 3   naloxone (NARCAN) nasal spray 4 mg/0.1 mL Place 1 spray into the nose as needed for up to 365 doses (for opioid-induced respiratory depresssion). In case of emergency (overdose), spray once into each nostril. If no response within 3 minutes, repeat  application and call 911. 1 each 0   ondansetron (ZOFRAN) 4 MG tablet Take 1 tablet (4 mg total) by mouth every 8 (eight) hours as needed for nausea or vomiting. 20 tablet 6   pantoprazole (PROTONIX) 40 MG tablet Take 1 tablet (40 mg total) by mouth 2 (two) times daily for 14 days, THEN 1 tablet (40 mg total) daily. 104 tablet 0   Probiotic Product (ALIGN PO) Take 1 tablet by mouth daily.     promethazine (PHENERGAN) 25 MG tablet Take 25 mg by mouth every 6 (six) hours as needed for nausea or vomiting.     Respiratory Therapy Supplies (FULL KIT NEBULIZER SET) MISC Nebulizer machine of patient's choice, please include tubes and hoses as needed.  Use as directed. 1 each 0   rOPINIRole (REQUIP) 0.25 MG tablet Take 1 tablet (0.25 mg total) by mouth at bedtime. 30 tablet 0   SUMAtriptan 6 MG/0.5ML SOAJ Inject 6 mg into the skin every 2 (two) hours as needed. Take at headache onset May repeat in 2 hours if headache persists or recurs. Max twice daily. 6 mL 11   No current facility-administered medications for this visit.    PAST MEDICAL HISTORY: Past Medical History:  Diagnosis Date   Abnormal MRI, lumbar spine (11/04/2021) 11/07/2021   (11/04/2021) LUMBAR MRI FINDINGS:  DISC LEVELS:  L4-L5: Mild facet arthropathy. Left far lateral/extraforaminal disc protrusion with mild resulting left foraminal stenosis.  L5-S1: Small left foraminal disc protrusion. Bilateral facet arthropathy.     IMPRESSION:  Lower lumbar degenerative change without impingement.     Abnormal MRI, shoulder (Right) 11/15/2018   FINDINGS:  Rotator cuff: Mild to moderate supraspinatus tendinopathy potentially with mild partial-thickness bursal surface tearing of the supraspinatus. Mild infraspinatus tendinopathy.  Muscles:  Unremarkable  Biceps long head:  Unremarkable  Acromioclavicular Joint: Mild degenerative spurring with a small amount of fluid signal in the [facility] joint. Type I acromion. Trace subacromial subdeltoid burs   Anxiety and  depression 01/20/2015   At risk for respiratory depression due to opioid 06/13/2022   Battery end of life of spinal cord stimulator 05/16/2016   Central sleep apnea comorbid with prescribed opioid use 08/19/2022   Cervicalgia 11/15/2018   Chronic cervical radicular pain (C7 Dermatome) (Bilateral) (L>R) 12/19/2014   Chronic feet pain (Bilateral) 10/30/2020   Chronic hip pain (3ry area of Pain) (Bilateral) (R>L) 08/20/2018   Chronic hip pain (Right) 06/13/2021   Chronic low back pain (Bilateral) (R>L) w/ sciatica (Bilateral) (R>L) 06/21/2019   Chronic low back pain (Bilateral) (R>L) w/o sciatica 12/19/2014   Chronic lower extremity pain (Bilateral) (R>L) 12/19/2014   Chronic  lower extremity pain (Right) 06/21/2019   Chronic lumbar radicular pain (Right L5 dermatome; Left S1 Dermatome) (Bilateral) (R>L) 12/19/2014   Right L5 radicular pain. Left S1 radicular pain.     Chronic migraine w/o aura w/o status migrainosus, not intractable 05/15/2022   Chronic musculoskeletal pain 12/19/2014   Chronic neck pain (1ry area of Pain) (Bilateral) (L>R) 12/19/2014   Chronic pain syndrome 07/09/2013   Chronic upper extremity pain (2ry area of Pain) (Bilateral) (L>R) 08/20/2018   Chronic use of opiate for therapeutic purpose 06/27/2020   Complaints of weakness of lower extremities (Bilateral) 11/07/2021   DDD (degenerative disc disease), cervical 11/15/2018   DDD (degenerative disc disease), lumbar 02/25/2019   Disorder of skeletal system 08/20/2018   Dropfoot (Right) 06/21/2019   Essential hypertension, benign 07/09/2013   Fibromyalgia    Gastroesophageal reflux disease 07/02/2007   Qualifier: Diagnosis of   By: Melvyn Neth CMA (AAMA), Patty         Generalized anxiety disorder 03/16/2014   Grade 1 Retrolisthesis of L4/L5 07/31/2021   Hypertension    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 pain 06/13/2022   Interstitial cystitis 05/04/2020   Intractable low back pain 04/25/2020   Irritable bowel syndrome with  both constipation and diarrhea 07/02/2007   Qualifier: Diagnosis of   By: Melvyn Neth CMA (AAMA), Patty         Latex precautions, history of latex allergy 04/08/2019   Long term prescription opiate use 12/19/2014   Loss of consciousness (HCC) 06/13/2022   Lower extremity numbness (Bilateral) 11/07/2021   Lumbar facet arthropathy 06/21/2019   Lumbar facet syndrome (Bilateral) (R>L) 06/29/2015   Lumbar spondylosis 06/29/2015   Lumbosacral radiculopathy at L5 (Right) 06/21/2019   Lumbosacral radiculopathy/radiculitis at L4 (Right) 07/31/2021   Metabolic syndrome 08/04/2020   Migraines 11/12/2019   Mixed hyperlipidemia 08/04/2020   Morbid obesity (HCC) 12/19/2014   Neurogenic pain 10/10/2016   Nocturia more than twice per night 06/13/2022   Osteoarthritis    Osteoarthritis involving multiple joints 04/08/2019   Osteoarthritis of hip (Right) 07/16/2021   Osteoarthrosis 12/19/2014   Pain    chronic regional pain syndrome   Pain in right knee 03/19/2017   Paresthesia 02/05/2022   Perimenopausal vasomotor symptoms 05/22/2022   Pharmacologic therapy 08/20/2018   Positive ANA (antinuclear antibody) 09/13/2015   Following with Azucena Fallen, PA at [facility] rheumatology. Recently seen for second opinion 10/03/2016 for evaluation of connective tissue disease and/or lupus. Prior serology show positive ANA without positive lupus specific antibodies.[name] Response to Plaquenil and as needed prednisone in the past. Intends do not meet clinical criteria for lupus. If specific ANA panel negative, no further workup   Prediabetes 10/19/2020   Presence of functional implant ([employer]tronic Lumbar spinal cord stimulator implant) 12/19/2014   Non-MRI compatible system. 08/2007.     Refractory chronic cough 06/13/2022   Restless leg syndrome 10/06/2022   Spinal cord stimulator status 05/16/2016   Spondylolisthesis at L4-L5 level 07/31/2021   Spondylosis without myelopathy or radiculopathy, lumbosacral region  02/25/2019   Uncomplicated opioid dependence (HCC) 12/19/2014   Vitamin D insufficiency 12/19/2014    PAST SURGICAL HISTORY: Past Surgical History:  Procedure Laterality Date   ANKLE SURGERY     APPENDECTOMY     BREAST SURGERY     CHOLECYSTECTOMY  2000   COLONOSCOPY N/A 02/20/2021   Procedure: COLONOSCOPY;  Surgeon: Regis Bill, MD;  Location: [facility] ENDOSCOPY;  Service: Endoscopy;  Laterality: N/A;   COLONOSCOPY WITH ESOPHAGOGASTRODUODENOSCOPY (EGD)     ENDOMETRIAL ABLATION  ESOPHAGOGASTRODUODENOSCOPY N/A 02/20/2021   Procedure: ESOPHAGOGASTRODUODENOSCOPY (EGD);  Surgeon: Regis Bill, MD;  Location: [facility] ENDOSCOPY;  Service: Endoscopy;  Laterality: N/A;   KNEE SURGERY     KNEE SURGERY Right 02/2015   LAPAROSCOPIC APPENDECTOMY Right 01/01/2020   Procedure: APPENDECTOMY LAPAROSCOPIC;  Surgeon: Emelia Loron, MD;  Location: [facility] ORS;  Service: General;  Laterality: Right;   OCCIPITAL NERVE STIMULATOR INSERTION     SHOULDER ARTHROSCOPY WITH DISTAL CLAVICLE RESECTION Right 10/29/2018   Procedure: SHOULDER ARTHROSCOPY WITH DISTAL CLAVICLE RESECTION;  Surgeon: Bjorn Pippin, MD;  Location: [facility];  Service: Orthopedics;  Laterality: Right;   SHOULDER SURGERY     spinal cord stimulator     SPINAL CORD STIMULATOR IMPLANT  2008   SPINAL CORD STIMULATOR INSERTION N/A 06/21/2016   Procedure: LUMBAR SPINAL CORD STIMULATOR INSERTION;  Surgeon: Odette Fraction, MD;  [facility];  Service: Neurosurgery;  Laterality: N/A;  LUMBAR SPINAL CORD STIMULATOR INSERTION   SUBACROMIAL DECOMPRESSION Right 10/29/2018   Procedure: SUBACROMIAL DECOMPRESSION;  Surgeon: Bjorn Pippin, MD;  Location: [facility];  Service: Orthopedics;  Laterality: Right;   TUBAL LIGATION  2000    FAMILY HISTORY: Family History  Problem Relation Age of Onset   Depression Mother        maternal grandmother   Asthma Mother    Fibromyalgia Other        aunt   Stroke Other         grandmother   Thyroid disease Other        grandmother    SOCIAL HISTORY: Social History   Socioeconomic History   Marital status: Married    Spouse name: Not on file   Number of children: Not on file   Years of education: Not on file   Highest education level: Some college, no degree  Occupational History   Not on file  Tobacco Use   Smoking status: Former    Current packs/day: 0.00    Types: Cigarettes    Quit date: 05/06/2020    Years since quitting: 2.5   Smokeless tobacco: Former    Quit date: 12/22/2017  Vaping Use   Vaping status: Every Day   Substances: Nicotine, Flavoring  Substance and Sexual Activity   Alcohol use: No    Alcohol/week: 0.0 standard drinks of alcohol   Drug use: No   Sexual activity: Yes    Partners: Male    Birth control/protection: Surgical  Other Topics Concern   Not on file  Social History Narrative   Not on file   Social Determinants of Health   Financial Resource Strain: Low Risk  (05/21/2022)   Overall Financial Resource Strain (CARDIA)    Difficulty of Paying Living Expenses: Not very hard  Food Insecurity: No Food Insecurity (05/21/2022)   Hunger Vital Sign    Worried About Running Out of Food in the Last Year: Never true    Ran Out of Food in the Last Year: Never true  Transportation Needs: No Transportation Needs (09/30/2022)   PRAPARE - Administrator, Civil Service (Medical): No    Lack of Transportation (Non-Medical): No  Physical Activity: Unknown (05/21/2022)   Exercise Vital Sign    Days of Exercise per Week: 0 days    Minutes of Exercise per Session: Not on file  Stress: No Stress Concern Present (05/21/2022)   [employer] of Occupational Health - Occupational Stress Questionnaire    Feeling of Stress : Only a  little  Social Connections: Moderately Integrated (05/21/2022)   Social Connection and Isolation Panel [NHANES]    Frequency of Communication with Friends and Family: More than three times a  week    Frequency of Social Gatherings with Friends and Family: More than three times a week    Attends Religious Services: 1 to 4 times per year    Active Member of [employer] or Organizations: No    Attends Engineer, structural: Not on file    Marital Status: Married  Catering manager Violence: Not on file     I spent 25 minutes of face-to-face and non-face-to-face time with patient via MyChart video visit.  This included previsit chart review, lab review, study review, order entry, electronic health record documentation, patient education and discussion regarding above diagnoses and treatment plan and answered all other questions to patient's satisfaction  [name], [facility]  [facility] [address] [place], [place] [address]  Phone [phone] Fax [phone] Note: This document was prepared with digital dictation and possible smart phrase technology. Any transcriptional errors that result from this process are unintentional.

## 2022-11-29 ENCOUNTER — Telehealth: Payer: Self-pay

## 2022-11-29 NOTE — Telephone Encounter (Signed)
Pt viewed results on My Chart per Dr. Madireddy's note. Routed to PCP.

## 2022-12-02 ENCOUNTER — Telehealth: Payer: Self-pay | Admitting: Neurology

## 2022-12-02 ENCOUNTER — Encounter: Payer: Self-pay | Admitting: Adult Health

## 2022-12-02 ENCOUNTER — Telehealth: Payer: BC Managed Care – PPO | Admitting: Adult Health

## 2022-12-02 DIAGNOSIS — G43709 Chronic migraine without aura, not intractable, without status migrainosus: Secondary | ICD-10-CM | POA: Diagnosis not present

## 2022-12-02 DIAGNOSIS — Z79891 Long term (current) use of opiate analgesic: Secondary | ICD-10-CM

## 2022-12-02 DIAGNOSIS — G4731 Primary central sleep apnea: Secondary | ICD-10-CM

## 2022-12-02 DIAGNOSIS — G43909 Migraine, unspecified, not intractable, without status migrainosus: Secondary | ICD-10-CM

## 2022-12-02 MED ORDER — NORTRIPTYLINE HCL 10 MG PO CAPS: 30.0000 mg | ORAL_CAPSULE | Freq: Every day | ORAL | 5 refills | Status: DC

## 2022-12-02 MED ORDER — ONDANSETRON HCL 4 MG PO TABS: 4.0000 mg | ORAL_TABLET | Freq: Three times a day (TID) | ORAL | 11 refills | Status: AC | PRN

## 2022-12-02 MED ORDER — NARATRIPTAN HCL 2.5 MG PO TABS: 2.5000 mg | ORAL_TABLET | ORAL | 11 refills | Status: DC | PRN

## 2022-12-02 NOTE — Telephone Encounter (Signed)
..   Pt understands that although there may be some limitations with this type of visit, we will take all precautions to reduce any security or privacy concerns.  Pt understands that this will be treated like an in office visit and we will file with pt's insurance, and there may be a patient responsible charge related to this service. ? ?

## 2022-12-02 NOTE — Patient Instructions (Signed)
Increase nortriptyline to 30mg  nightly for migraine prevention  Start naratriptan for migraine rescue

## 2022-12-03 ENCOUNTER — Ambulatory Visit: Payer: BC Managed Care – PPO | Admitting: Medical-Surgical

## 2022-12-05 ENCOUNTER — Ambulatory Visit: Payer: BC Managed Care – PPO

## 2022-12-06 ENCOUNTER — Other Ambulatory Visit: Payer: Self-pay | Admitting: Medical-Surgical

## 2022-12-09 MED ORDER — ROPINIROLE HCL 0.25 MG PO TABS: 0.2500 mg | ORAL_TABLET | Freq: Every day | ORAL | 0 refills | Status: DC

## 2022-12-11 ENCOUNTER — Ambulatory Visit (INDEPENDENT_AMBULATORY_CARE_PROVIDER_SITE_OTHER): Payer: BC Managed Care – PPO

## 2022-12-11 DIAGNOSIS — Z1231 Encounter for screening mammogram for malignant neoplasm of breast: Secondary | ICD-10-CM

## 2022-12-12 ENCOUNTER — Encounter: Payer: Self-pay | Admitting: Medical-Surgical

## 2022-12-12 ENCOUNTER — Ambulatory Visit: Payer: BC Managed Care – PPO | Admitting: Medical-Surgical

## 2022-12-31 ENCOUNTER — Other Ambulatory Visit: Payer: Self-pay | Admitting: Medical-Surgical

## 2022-12-31 MED ORDER — PANTOPRAZOLE SODIUM 40 MG PO TBEC: DELAYED_RELEASE_TABLET | ORAL | 0 refills | Status: DC

## 2023-01-06 ENCOUNTER — Other Ambulatory Visit: Payer: Self-pay | Admitting: Medical-Surgical

## 2023-01-07 ENCOUNTER — Encounter: Payer: Self-pay | Admitting: Medical-Surgical

## 2023-01-07 MED ORDER — PANTOPRAZOLE SODIUM 40 MG PO TBEC: 40.0000 mg | DELAYED_RELEASE_TABLET | Freq: Every day | ORAL | 0 refills | Status: DC

## 2023-01-07 MED ORDER — DULOXETINE HCL 60 MG PO CPEP: 60.0000 mg | ORAL_CAPSULE | Freq: Every day | ORAL | 1 refills | Status: DC

## 2023-01-12 NOTE — Progress Notes (Unsigned)
PROVIDER NOTE: Information contained herein reflects review and annotations entered in association with encounter. Interpretation of such information and data should be left to medically-trained personnel. Information provided to patient can be located elsewhere in the medical record under "Patient Instructions". Document created using STT-dictation technology, any transcriptional errors that may result from process are unintentional.    Patient: Mary Cruz  Service Category: E/M  Provider: Oswaldo Done, MD  DOB: 08-29-1975  DOS: 01/13/2023  Referring Provider: Christen Butter, NP  MRN: [id number]  Specialty: Interventional Pain Management  PCP: Christen Butter, NP  Type: Established Patient  Setting: Ambulatory outpatient    Location: Office  Delivery: Face-to-face     HPI  [name], a 47 y.o. year old female, is here today because of her No primary diagnosis found.. [name] primary complain today is No chief complaint on file.  Pertinent problems: [name] has Chronic pain syndrome; Chronic musculoskeletal pain; Osteoarthrosis; Presence of functional implant ([employer]tronic Lumbar spinal cord stimulator implant); Chronic low back pain (Bilateral) (R>L) w/o sciatica; Chronic lumbar radicular pain (Right L5 dermatome; Left S1 Dermatome) (Bilateral) (R>L); Chronic neck pain (1ry area of Pain) (Bilateral) (L>R); Chronic cervical radicular pain (C7 Dermatome) (Bilateral) (L>R); Chronic lower extremity pain (Bilateral) (R>L); Lumbar spondylosis; Lumbar facet syndrome (Bilateral) (R>L); Neurogenic pain; Fibromyalgia; Pain in right knee; Chronic upper extremity pain (2ry area of Pain) (Bilateral) (L>R); Chronic hip pain (3ry area of Pain) (Bilateral) (R>L); DDD (degenerative disc disease), cervical; Cervicalgia; Abnormal MRI, shoulder (Right); Spondylosis without myelopathy or radiculopathy, lumbosacral region; DDD (degenerative disc disease), lumbar; Osteoarthritis involving multiple  joints; Chronic low back pain (Bilateral) (R>L) w/ sciatica (Bilateral) (R>L); Chronic lower extremity pain (Right); Lumbosacral radiculopathy at L5 (Right); Dropfoot (Right); Lumbar facet arthropathy; Intractable low back pain; Chronic feet pain (Bilateral); Migraines; Chronic hip pain (Right); Osteoarthritis of hip (Right); Grade 1 Retrolisthesis of L4/L5; Spondylolisthesis at L4-L5 level; Lumbosacral radiculopathy/radiculitis at L4 (Right); Lower extremity numbness (Bilateral); Complaints of weakness of lower extremities (Bilateral); Abnormal MRI, lumbar spine (11/04/2021); and Paresthesia on their pertinent problem list. Pain Assessment: Severity of   is reported as a  /10. Location:    / . Onset:  . Quality:  . Timing:  . Modifying factor(s):  [name] Vitals:  vitals were not taken for this visit.  BMI: Estimated body mass index is 48.28 kg/m as calculated from the following:   Height as of 10/24/22: 5' 3.5" (1.613 m).   Weight as of 10/24/22: 276 lb 14.4 oz (125.6 kg). Last encounter: 10/23/2022. Last procedure: Visit date not found.  Reason for encounter: medication management. *** Discussed the use of AI scribe software for clinical note transcription with the patient, who gave verbal consent to proceed.  History of Present Illness         04/09/2023   Pharmacotherapy Assessment  Analgesic: Oxycodone IR 5 mg, 1 tab PO q 6 hrs (20 mg/day of oxycodone) MME/day: 30 mg/day.   Monitoring: [place] PMP: PDMP reviewed during this encounter.       Pharmacotherapy: No side-effects or adverse reactions reported. Compliance: No problems identified. Effectiveness: Clinically acceptable.  No notes on file  No results found for: "CBDTHCR" No results found for: "D8THCCBX" No results found for: "D9THCCBX"  UDS:  Summary  Date Value Ref Range Status  10/23/2022 Note  Final    Comment:    ==================================================================== [employer] [facility] [employer]  (MW) ==================================================================== Test  Result       Flag       Units  Drug Present and Declared for Prescription Verification   Buprenorphine                  2            EXPECTED   ng/mg creat    Sources of buprenorphine include scheduled prescription medications.  ==================================================================== Test                      Result    Flag   Units      Ref Range   Creatinine              112              mg/dL      >=78 ==================================================================== Declared Medications:  The flagging and interpretation on this report are based on the  following declared medications.  Unexpected results may arise from  inaccuracies in the declared medications.   **Note: The testing scope of this panel does not include small to  moderate amounts of these reported medications:   Buprenorphine Patch (BuTrans)   **Note: The testing scope of this panel does not include the  following reported medications:   Albuterol  Benzonatate (Tessalon)  Budesonide (Pulmicort)  Duloxetine (Cymbalta)  Famotidine (Pepcid)  Fezolinetant (Veozah)  Hydrochlorothiazide (Zestoretic)  Hyoscyamine (Levsin)  Lisinopril (Zestoretic)  Meclizine (Antivert)  Methocarbamol (Robaxin)  Naloxone (Narcan)  Ondansetron (Zofran)  Pantoprazole (Protonix)  Probiotic  Promethazine (Phenergan)  Ropinirole (Requip)  Sumatriptan ==================================================================== For clinical consultation, please call [phone]. ====================================================================       ROS  Constitutional: Denies any fever or chills Gastrointestinal: No reported hemesis, hematochezia, vomiting, or acute GI distress Musculoskeletal: Denies any acute onset joint swelling, redness, loss of ROM, or weakness Neurological: No reported episodes of  acute onset apraxia, aphasia, dysarthria, agnosia, amnesia, paralysis, loss of coordination, or loss of consciousness  Medication Review  DULoxetine, Fezolinetant, Full Kit Nebulizer Set, Probiotic Product, SUMAtriptan, albuterol, budesonide, buprenorphine, busPIRone, clotrimazole-betamethasone, famotidine, hyoscyamine, levocetirizine, lisinopril-hydrochlorothiazide, meclizine, methocarbamol, metoprolol tartrate, naloxone, naratriptan, nortriptyline, ondansetron, pantoprazole, promethazine, and rOPINIRole  History Review  Allergy: Ms. Kroh is allergic to celecoxib, doxycycline, gabapentin, latex, nabumetone, pentazocine lactate, seroquel [quetiapine], amoxicillin, bactrim [sulfamethoxazole-trimethoprim], cephalexin, erythromycin, hydromorphone hcl, and lyrica [pregabalin]. Drug: Ms. Macinnes  reports no history of drug use. Alcohol:  reports no history of alcohol use. Tobacco:  reports that she quit smoking about 2 years ago. Her smoking use included cigarettes. She quit smokeless tobacco use about 5 years ago. Social: Ms. Depaepe  reports that she quit smoking about 2 years ago. Her smoking use included cigarettes. She quit smokeless tobacco use about 5 years ago. She reports that she does not drink alcohol and does not use drugs. Medical:  has a past medical history of Abnormal MRI, lumbar spine (11/04/2021) (11/07/2021), Abnormal MRI, shoulder (Right) (11/15/2018), Anxiety and depression (01/20/2015), At risk for respiratory depression due to opioid (06/13/2022), Battery end of life of spinal cord stimulator (05/16/2016), Central sleep apnea comorbid with prescribed opioid use (08/19/2022), Cervicalgia (11/15/2018), Chronic cervical radicular pain (C7 Dermatome) (Bilateral) (L>R) (12/19/2014), Chronic feet pain (Bilateral) (10/30/2020), Chronic hip pain (3ry area of Pain) (Bilateral) (R>L) (08/20/2018), Chronic hip pain (Right) (06/13/2021), Chronic low back pain (Bilateral) (R>L) w/ sciatica  (Bilateral) (R>L) (06/21/2019), Chronic low back pain (Bilateral) (R>L) w/o sciatica (12/19/2014), Chronic lower extremity pain (Bilateral) (R>L) (12/19/2014), Chronic lower extremity pain (Right) (06/21/2019),  Chronic lumbar radicular pain (Right L5 dermatome; Left S1 Dermatome) (Bilateral) (R>L) (12/19/2014), Chronic migraine w/o aura w/o status migrainosus, not intractable (05/15/2022), Chronic musculoskeletal pain (12/19/2014), Chronic neck pain (1ry area of Pain) (Bilateral) (L>R) (12/19/2014), Chronic pain syndrome (07/09/2013), Chronic upper extremity pain (2ry area of Pain) (Bilateral) (L>R) (08/20/2018), Chronic use of opiate for therapeutic purpose (06/27/2020), Complaints of weakness of lower extremities (Bilateral) (11/07/2021), DDD (degenerative disc disease), cervical (11/15/2018), DDD (degenerative disc disease), lumbar (02/25/2019), Disorder of skeletal system (08/20/2018), Dropfoot (Right) (06/21/2019), Essential hypertension, benign (07/09/2013), Fibromyalgia, Gastroesophageal reflux disease (07/02/2007), Generalized anxiety disorder (03/16/2014), Grade 1 Retrolisthesis of L4/L5 (07/31/2021), Hypertension, 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 pain (06/13/2022), Interstitial cystitis (05/04/2020), Intractable low back pain (04/25/2020), Irritable bowel syndrome with both constipation and diarrhea (07/02/2007), Latex precautions, history of latex allergy (04/08/2019), Long term prescription opiate use (12/19/2014), Loss of consciousness (HCC) (06/13/2022), Lower extremity numbness (Bilateral) (11/07/2021), Lumbar facet arthropathy (06/21/2019), Lumbar facet syndrome (Bilateral) (R>L) (06/29/2015), Lumbar spondylosis (06/29/2015), Lumbosacral radiculopathy at L5 (Right) (06/21/2019), Lumbosacral radiculopathy/radiculitis at L4 (Right) (07/31/2021), Metabolic syndrome (08/04/2020), Migraines (11/12/2019), Mixed hyperlipidemia (08/04/2020), Morbid obesity (HCC) (12/19/2014), Neurogenic pain (10/10/2016),  Nocturia more than twice per night (06/13/2022), Osteoarthritis, Osteoarthritis involving multiple joints (04/08/2019), Osteoarthritis of hip (Right) (07/16/2021), Osteoarthrosis (12/19/2014), Pain, Pain in right knee (03/19/2017), Paresthesia (02/05/2022), Perimenopausal vasomotor symptoms (05/22/2022), Pharmacologic therapy (08/20/2018), Positive ANA (antinuclear antibody) (09/13/2015), Prediabetes (10/19/2020), Presence of functional implant (Medtronic Lumbar spinal cord stimulator implant) (12/19/2014), Refractory chronic cough (06/13/2022), Restless leg syndrome (10/06/2022), Spinal cord stimulator status (05/16/2016), Spondylolisthesis at L4-L5 level (07/31/2021), Spondylosis without myelopathy or radiculopathy, lumbosacral region (02/25/2019), Uncomplicated opioid dependence (HCC) (12/19/2014), and Vitamin D insufficiency (12/19/2014). Surgical: Ms. Plantz  has a past surgical history that includes spinal cord stimulator; Occipital nerve stimulator insertion; Cholecystectomy (2000); Knee surgery; Shoulder surgery; Tubal ligation (2000); Endometrial ablation; Spinal cord stimulator implant (2008); Knee surgery (Right, 02/2015); Breast surgery; Spinal cord stimulator insertion (N/A, 06/21/2016); Shoulder arthroscopy with distal clavicle resection (Right, 10/29/2018); Subacromial decompression (Right, 10/29/2018); laparoscopic appendectomy (Right, 01/01/2020); Appendectomy; Ankle surgery; Colonoscopy with esophagogastroduodenoscopy (egd); Colonoscopy (N/A, 02/20/2021); and Esophagogastroduodenoscopy (N/A, 02/20/2021). Family: family history includes Asthma in her mother; Depression in her mother; Fibromyalgia in an other family member; Stroke in an other family member; Thyroid disease in an other family member.  Laboratory Chemistry Profile   Renal Lab Results  Component Value Date   BUN 15 05/26/2022   CREATININE 0.99 05/26/2022   LABCREA 51 11/20/2020   BCR 18 05/21/2022   GFRAA 84 08/03/2020    GFRNONAA >60 05/26/2022    Hepatic Lab Results  Component Value Date   AST 17 05/21/2022   ALT 19 05/21/2022   ALBUMIN 3.4 (L) 10/13/2021   ALKPHOS 88 10/13/2021   LIPASE 24 10/13/2021    Electrolytes Lab Results  Component Value Date   NA 135 05/26/2022   K 3.4 (L) 05/26/2022   CL 102 05/26/2022   CALCIUM 8.6 (L) 05/26/2022   MG 2.0 04/20/2019   PHOS 3.2 04/20/2019    Bone Lab Results  Component Value Date   VD25OH 21 (L) 07/23/2021    Inflammation (CRP: Acute Phase) (ESR: Chronic Phase) Lab Results  Component Value Date   CRP 1.0 (H) 04/05/2015   ESRSEDRATE 28 (H) 11/20/2020   LATICACIDVEN 1.8 10/13/2021         Note: Above Lab results reviewed.  Recent Imaging Review  LONG TERM MONITOR (3-14 DAYS) Indication:syncope  Duration: 6.75d  Findings HR  avg 93  Min 53-Max 138  PVCs Zero PAC Rare, less than 1%   No svt or vt noted  Symptoms:  LH/SOB and nausea>> sinus  fluttering>> sinus  Triggered: 65 events recorded  Asl sinus rhythm rates 82-132  Conclusions: No signficant arrhythmia     Note: Reviewed        Physical Exam  General appearance: Well nourished, well developed, and well hydrated. In no apparent acute distress Mental status: Alert, oriented x 3 (person, place, & time)       Respiratory: No evidence of acute respiratory distress Eyes: PERLA Vitals: There were no vitals taken for this visit. BMI: Estimated body mass index is 48.28 kg/m as calculated from the following:   Height as of 10/24/22: 5' 3.5" (1.613 m).   Weight as of 10/24/22: 276 lb 14.4 oz (125.6 kg). Ideal: Patient weight not recorded  Assessment   Diagnosis Status  1. Chronic neck pain (1ry area of Pain) (Bilateral) (L>R)   2. Chronic upper extremity pain (2ry area of Pain) (Bilateral) (L>R)   3. Chronic hip pain (3ry area of Pain) (Bilateral) (R>L)   4. Lumbar facet syndrome (Bilateral) (R>L)   5. Intractable low back pain   6. Chronic pain syndrome   7.  Pharmacologic therapy   8. Chronic use of opiate for therapeutic purpose   9. At risk for respiratory depression due to opioid   10. Central sleep apnea comorbid with prescribed opioid use   11. Encounter for medication management   12. Encounter for chronic pain management    Controlled Controlled Controlled   Updated Problems: No problems updated.  Plan of Care  Problem-specific:  Assessment and Plan            Ms. CHELA TURRELL has a current medication list which includes the following long-term medication(s): albuterol, albuterol, budesonide, buprenorphine, duloxetine, famotidine, hyoscyamine, levocetirizine, lisinopril-hydrochlorothiazide, methocarbamol, metoprolol tartrate, naloxone, naratriptan, nortriptyline, pantoprazole, ropinirole, and sumatriptan.  Pharmacotherapy (Medications Ordered): No orders of the defined types were placed in this encounter.  Orders:  No orders of the defined types were placed in this encounter.  Follow-up plan:   No follow-ups on file.      Interventional Therapies  Risk Factors  Considerations:   Latex allergy    Planned  Pending:   Diagnostic right hip x-rays and MRI (06/13/2021) Diagnostic/therapeutic right IA hip joint inj. #1    Under consideration:   Diagnostic/therapeutic right IA hip joint inj. #1  Diagnostic right suprascapular NB #1    Completed:   Palliative bilateral lumbar facet MBB x2 (11/16/2020) (90/90/80/85-90)  Therapeutic left lumbar facet MBB x2 (10/10/2016) (100/100/100/100)  Therapeutic right lumbar facet MBB x1 (08/31/2015) (n/a)  Therapeutic right lumbar facet RFA x3 (05/08/2021) (100/100/50/50) on the right side Therapeutic left lumbar facet RFA x3 (03/22/2021) (100/100/99/99) on left side Palliative left C7-T1 CESI x1 (04/13/2015) (n/a)    Therapeutic  Palliative (PRN) options:   Palliative lumbar facet MBB  Palliative lumbar facet RFA  Palliative C7-T1 CESI  PRN SCS programming adjustments (2018  replacement by Dr. Odette Fraction)   Pharmacotherapy  Nonopioids transferred 04/06/2020: Robaxin          Recent Visits Date Type Provider Dept  10/23/22 Office Visit Delano Metz, MD [facility]  Showing recent visits within past 90 days and meeting all other requirements Future Appointments Date Type Provider Dept  01/13/23 Appointment Delano Metz, MD [facility]  Showing future appointments within next 90 days and meeting all other requirements  I discussed the  assessment and treatment plan with the patient. The patient was provided an opportunity to ask questions and all were answered. The patient agreed with the plan and demonstrated an understanding of the instructions.  Patient advised to call back or seek an in-person evaluation if the symptoms or condition worsens.  Duration of encounter: *** minutes.  Total time on encounter, as per AMA guidelines included both the face-to-face and non-face-to-face time personally spent by the physician and/or other qualified health care professional(s) on the day of the encounter (includes time in activities that require the physician or other qualified health care professional and does not include time in activities normally performed by clinical staff). Physician's time may include the following activities when performed: Preparing to see the patient (e.g., pre-charting review of records, searching for previously ordered imaging, lab work, and nerve conduction tests) Review of prior analgesic pharmacotherapies. Reviewing PMP Interpreting ordered tests (e.g., lab work, imaging, nerve conduction tests) Performing post-procedure evaluations, including interpretation of diagnostic procedures Obtaining and/or reviewing separately obtained history Performing a medically appropriate examination and/or evaluation Counseling and educating the patient/family/caregiver Ordering medications, tests, or  procedures Referring and communicating with other health care professionals (when not separately reported) Documenting clinical information in the electronic or other health record Independently interpreting results (not separately reported) and communicating results to the patient/ family/caregiver Care coordination (not separately reported)  Note by: Oswaldo Done, MD Date: 01/13/2023; Time: 4:51 PM

## 2023-01-12 NOTE — Patient Instructions (Incomplete)
______________________________________________________________________    Opioid Pain Medication Update  To: All patients taking opioid pain medications. (I.e.: hydrocodone, hydromorphone, oxycodone, oxymorphone, morphine, codeine, methadone, tapentadol, tramadol, buprenorphine, fentanyl, etc.)  Re: Updated review of side effects and adverse reactions of opioid analgesics, as well as new information about long term effects of this class of medications.  Direct risks of long-term opioid therapy are not limited to opioid addiction and overdose. Potential medical risks include serious fractures, breathing problems during sleep, hyperalgesia, immunosuppression, chronic constipation, bowel obstruction, myocardial infarction, and tooth decay secondary to xerostomia.  Unpredictable adverse effects that can occur even if you take your medication correctly: Cognitive impairment, respiratory depression, and death. Most people think that if they take their medication "correctly", and "as instructed", that they will be safe. Nothing could be farther from the truth. In reality, a significant amount of recorded deaths associated with the use of opioids has occurred in individuals that had taken the medication for a long time, and were taking their medication correctly. The following are examples of how this can happen: Patient taking his/her medication for a long time, as instructed, without any side effects, is given a certain antibiotic or another unrelated medication, which in turn triggers a "Drug-to-drug interaction" leading to disorientation, cognitive impairment, impaired reflexes, respiratory depression or an untoward event leading to serious bodily harm or injury, including death.  Patient taking his/her medication for a long time, as instructed, without any side effects, develops an acute impairment of liver and/or kidney function. This will lead to a rapid inability of the body to breakdown and eliminate  their pain medication, which will result in effects similar to an "overdose", but with the same medicine and dose that they had always taken. This again may lead to disorientation, cognitive impairment, impaired reflexes, respiratory depression or an untoward event leading to serious bodily harm or injury, including death.  A similar problem will occur with patients as they grow older and their liver and kidney function begins to decrease as part of the aging process.  Background information: Historically, the original case for using long-term opioid therapy to treat chronic noncancer pain was based on safety assumptions that subsequent experience has called into question. In 1996, the American Pain Society and the American Academy of Pain Medicine issued a consensus statement supporting long-term opioid therapy. This statement acknowledged the dangers of opioid prescribing but concluded that the risk for addiction was low; respiratory depression induced by opioids was short-lived, occurred mainly in opioid-naive patients, and was antagonized by pain; tolerance was not a common problem; and efforts to control diversion should not constrain opioid prescribing. This has now proven to be wrong. Experience regarding the risks for opioid addiction, misuse, and overdose in community practice has failed to support these assumptions.  According to the Centers for Disease Control and Prevention, fatal overdoses involving opioid analgesics have increased sharply over the past decade. Currently, more than 96,700 people die from drug overdoses every year. Opioids are a factor in 7 out of every 10 overdose deaths. Deaths from drug overdose have surpassed motor vehicle accidents as the leading cause of death for individuals between the ages of 58 and 90.  Clinical data suggest that neuroendocrine dysfunction may be very common in both men and women, potentially causing hypogonadism, erectile dysfunction, infertility,  decreased libido, osteoporosis, and depression. Recent studies linked higher opioid dose to increased opioid-related mortality. Controlled observational studies reported that long-term opioid therapy may be associated with increased risk for cardiovascular events. Subsequent  meta-analysis concluded that the safety of long-term opioid therapy in elderly patients has not been proven.   Side Effects and adverse reactions: Common side effects: Drowsiness (sedation). Dizziness. Nausea and vomiting. Constipation. Physical dependence -- Dependence often manifests with withdrawal symptoms when opioids are discontinued or decreased. Tolerance -- As you take repeated doses of opioids, you require increased medication to experience the same effect of pain relief. Respiratory depression -- This can occur in healthy people, especially with higher doses. However, people with COPD, asthma or other lung conditions may be even more susceptible to fatal respiratory impairment.  Uncommon side effects: An increased sensitivity to feeling pain and extreme response to pain (hyperalgesia). Chronic use of opioids can lead to this. Delayed gastric emptying (the process by which the contents of your stomach are moved into your small intestine). Muscle rigidity. Immune system and hormonal dysfunction. Quick, involuntary muscle jerks (myoclonus). Arrhythmia. Itchy skin (pruritus). Dry mouth (xerostomia).  Long-term side effects: Chronic constipation. Sleep-disordered breathing (SDB). Increased risk of bone fractures. Hypothalamic-pituitary-adrenal dysregulation. Increased risk of overdose.  RISKS: Respiratory depression and death: Opioids increase the risk of respiratory depression and death.  Drug-to-drug interactions: Opioids are relatively contraindicated in combination with benzodiazepines, sleep inducers, and other central nervous system depressants. Other classes of medications (i.e.: certain antibiotics  and even over-the-counter medications) may also trigger or induce respiratory depression in some patients.  Medical conditions: Patients with pre-existing respiratory problems are at higher risk of respiratory failure and/or depression when in combination with opioid analgesics. Opioids are relatively contraindicated in some medical conditions such as central sleep apnea.   Fractures and Falls:  Opioids increase the risk and incidence of falls. This is of particular importance in elderly patients.  Endocrine System:  Long-term administration is associated with endocrine abnormalities (endocrinopathies). (Also known as Opioid-induced Endocrinopathy) Influences on both the hypothalamic-pituitary-adrenal axis?and the hypothalamic-pituitary-gonadal axis have been demonstrated with consequent hypogonadism and adrenal insufficiency in both sexes. Hypogonadism and decreased levels of dehydroepiandrosterone sulfate have been reported in men and women. Endocrine effects include: Amenorrhoea in women (abnormal absence of menstruation) Reduced libido in both sexes Decreased sexual function Erectile dysfunction in men Hypogonadisms (decreased testicular function with shrinkage of testicles) Infertility Depression and fatigue Loss of muscle mass Anxiety Depression Immune suppression Hyperalgesia Weight gain Anemia Osteoporosis Patients (particularly women of childbearing age) should avoid opioids. There is insufficient evidence to recommend routine monitoring of asymptomatic patients taking opioids in the long-term for hormonal deficiencies.  Immune System: Human studies have demonstrated that opioids have an immunomodulating effect. These effects are mediated via opioid receptors both on immune effector cells and in the central nervous system. Opioids have been demonstrated to have adverse effects on antimicrobial response and anti-tumour surveillance. Buprenorphine has been demonstrated to have  no impact on immune function.  Opioid Induced Hyperalgesia: Human studies have demonstrated that prolonged use of opioids can lead to a state of abnormal pain sensitivity, sometimes called opioid induced hyperalgesia (OIH). Opioid induced hyperalgesia is not usually seen in the absence of tolerance to opioid analgesia. Clinically, hyperalgesia may be diagnosed if the patient on long-term opioid therapy presents with increased pain. This might be qualitatively and anatomically distinct from pain related to disease progression or to breakthrough pain resulting from development of opioid tolerance. Pain associated with hyperalgesia tends to be more diffuse than the pre-existing pain and less defined in quality. Management of opioid induced hyperalgesia requires opioid dose reduction.  Cancer: Chronic opioid therapy has been associated with an increased risk  of cancer among noncancer patients with chronic pain. This association was more evident in chronic strong opioid users. Chronic opioid consumption causes significant pathological changes in the small intestine and colon. Epidemiological studies have found that there is a link between opium dependence and initiation of gastrointestinal cancers. Cancer is the second leading cause of death after cardiovascular disease. Chronic use of opioids can cause multiple conditions such as GERD, immunosuppression and renal damage as well as carcinogenic effects, which are associated with the incidence of cancers.   Mortality: Long-term opioid use has been associated with increased mortality among patients with chronic non-cancer pain (CNCP).  Prescription of long-acting opioids for chronic noncancer pain was associated with a significantly increased risk of all-cause mortality, including deaths from causes other than overdose.  Reference: Von Korff M, Kolodny A, Deyo RA, Chou R. Long-term opioid therapy reconsidered. Ann Intern Med. 2011 Sep 6;155(5):325-8. doi:  10.7326/[id number]. PMID: 62130865; PMCID: [id number]. Randon Goldsmith, Hayward RA, Dunn KM, [place] KP. Risk of adverse events in patients prescribed long-term opioids: A cohort study in the [place] [facility] Research Datalink. Eur J Pain. 2019 May;23(5):908-922. doi: 10.1002/ejp.1357. Epub 2019 Jan 31. PMID: 84132440. Colameco S, Coren JS, Ciervo CA. Continuous opioid treatment for chronic noncancer pain: a time for moderation in prescribing. Postgrad Med. 2009 Jul;121(4):61-6. doi: 10.3810/pgm.2009.07.2032. PMID: 10272536. William Hamburger RN, Rio SD, Blazina I, Cristopher Peru, Bougatsos C, Deyo RA. The effectiveness and risks of long-term opioid therapy for chronic pain: a systematic review for a [employer] of [employer] Pathways to [employer]. Ann Intern Med. 2015 Feb 17;162(4):276-86. doi: 10.7326/M14-2559. PMID: 64403474. Caryl Bis [facility], Mak[place] DM. NCHS Data Brief No. 22. [place]: Centers for Disease Control and Prevention; 2009. Sep, Increase in Fatal Poisonings Involving Opioid Analgesics in the [place], 1999-2006. Song IA, Choi HR, Oh TK. Long-term opioid use and mortality in patients with chronic non-cancer pain: Ten-year follow-up study in [place] from 2010 through 2019. EClinicalMedicine. 2022 Jul 18;51:101558. doi: 10.1016/j.eclinm.2022.259563. PMID: 87564332; PMCID: [id number]. Huser, W., Schubert, T., Vogelmann, T. et al. All-cause mortality in patients with long-term opioid therapy compared with non-opioid analgesics for chronic non-cancer pain: a database study. BMC Med 18, 162 (2020). http://lester.info/ Rashidian H, Karie Kirks, Malekzadeh R, Haghdoost AA. An Ecological Study of the Association between Opiate Use and Incidence of Cancers. Addict Health. 2016 Fall;8(4):252-260. PMID: 63016010; PMCID: [id number].  Our Goal: Our goal is to control your pain with means other  than the use of opioid pain medications.  Our Recommendation: Talk to your physician about coming off of these medications. We can assist you with the tapering down and stopping these medicines. Based on the new information, even if you cannot completely stop the medication, a decrease in the dose may be associated with a lesser risk. Ask for other means of controlling the pain. Decrease or eliminate those factors that significantly contribute to your pain such as smoking, obesity, and a diet heavily tilted towards "inflammatory" nutrients.  Last Updated: 08/19/2022   ______________________________________________________________________       ______________________________________________________________________    National Pain Medication Shortage  The [place] is experiencing worsening drug shortages. These have had a negative widespread effect on patient care and treatment. Not expected to improve any time soon. Predicted to last past 2029.   Drug shortage list (generic names) Oxycodone IR Oxycodone/APAP Oxymorphone IR Hydromorphone Hydrocodone/APAP Morphine  Where is the problem?  Manufacturing and supply level.  Will this shortage  affect you?  Only if you take any of the above pain medications.  How? You may be unable to fill your prescription.  Your pharmacist may offer a "partial fill" of your prescription. (Warning: Do not accept partial fills.) Prescriptions partially filled cannot be transferred to another pharmacy. Read our Medication Rules and Regulation. Depending on how much medicine you are dependent on, you may experience withdrawals when unable to get the medication.  Recommendations: Consider ending your dependence on opioid pain medications. Ask your pain specialist to assist you with the process. Consider switching to a medication currently not in shortage, such as Buprenorphine. Talk to your pain specialist about this option. Consider decreasing your pain  medication requirements by managing tolerance thru "Drug Holidays". This may help minimize withdrawals, should you run out of medicine. Control your pain thru the use of non-pharmacological interventional therapies.   Your prescriber: Prescribers cannot be blamed for shortages. Medication manufacturing and supply issues cannot be fixed by the prescriber.   NOTE: The prescriber is not responsible for supplying the medication, or solving supply issues. Work with your pharmacist to solve it. The patient is responsible for the decision to take or continue taking the medication and for identifying and securing a legal supply source. By law, supplying the medication is the job and responsibility of the pharmacy. The prescriber is responsible for the evaluation, monitoring, and prescribing of these medications.   Prescribers will NOT: Re-issue prescriptions that have been partially filled. Re-issue prescriptions already sent to a pharmacy.  Re-send prescriptions to a different pharmacy because yours did not have your medication. Ask pharmacist to order more medicine or transfer the prescription to another pharmacy. (Read below.)  New 2023 regulation: "October 12, 2021 Revised Regulation Allows DEA-Registered Pharmacies to Transfer Electronic Prescriptions at a Patient's Request DEA Headquarters Division - Public Information Office Patients now have the ability to request their electronic prescription be transferred to another pharmacy without having to go back to their practitioner to initiate the request. This revised regulation went into effect on Monday, October 08, 2021.     At a patient's request, a DEA-registered retail pharmacy can now transfer an electronic prescription for a controlled substance (schedules II-V) to another DEA-registered retail pharmacy. Prior to this change, patients would have to go through their practitioner to cancel their prescription and have it re-issued to a different  pharmacy. The process was taxing and time consuming for both patients and practitioners.    The Drug Enforcement Administration [facility]) published its intent to revise the process for transferring electronic prescriptions on December 31, 2019.  The final rule was published in the federal register on September 06, 2021 and went into effect 30 days later.  Under the final rule, a prescription can only be transferred once between pharmacies, and only if allowed under existing state or other applicable law. The prescription must remain in its electronic form; may not be altered in any way; and the transfer must be communicated directly between two licensed pharmacists. It's important to note, any authorized refills transfer with the original prescription, which means the entire prescription will be filled at the same pharmacy".  Reference: [email or web address] 2020 [facility] website announcement)  [email or web address] [employer] of Justice)   [employer] / Vol. 88, No. 143 / Thursday, September 06, 2021 / Rules and Regulations DEPARTMENT OF JUSTICE  Drug Enforcement Administration  21 CFR Part 1306  [Docket No. [id number]]  RIN [id number] Transfer of Electronic Prescriptions for Schedules II-V Controlled Substances Between  Pharmacies for Initial Filling  ______________________________________________________________________       ______________________________________________________________________    Transfer of Pain Medication between Pharmacies  Re: 2023 DEA Clarification on existing regulation  Published on DEA Website: October 12, 2021  Title: Revised Regulation Allows DEA-Registered Pharmacies to Electrical engineer Prescriptions at a Patient's Request DEA Headquarters Division - [employer]  "Patients now have the ability to  request their electronic prescription be transferred to another pharmacy without having to go back to their practitioner to initiate the request. This revised regulation went into effect on Monday, October 08, 2021.     At a patient's request, a DEA-registered retail pharmacy can now transfer an electronic prescription for a controlled substance (schedules II-V) to another DEA-registered retail pharmacy. Prior to this change, patients would have to go through their practitioner to cancel their prescription and have it re-issued to a different pharmacy. The process was taxing and time consuming for both patients and practitioners.    The Drug Enforcement Administration [facility]) published its intent to revise the process for transferring electronic prescriptions on December 31, 2019.  The final rule was published in the federal register on September 06, 2021 and went into effect 30 days later.  Under the final rule, a prescription can only be transferred once between pharmacies, and only if allowed under existing state or other applicable law. The prescription must remain in its electronic form; may not be altered in any way; and the transfer must be communicated directly between two licensed pharmacists. It's important to note, any authorized refills transfer with the original prescription, which means the entire prescription will be filled at the same pharmacy."    REFERENCES: 1. DEA website announcement [email or web address]  2. Department of Justice website  [email or web address]  3. DEPARTMENT OF JUSTICE Drug Enforcement Administration 21 CFR Part 1306 [Docket No. [id number]] RIN [id number] "Transfer of Electronic Prescriptions for Schedules II-V Controlled Substances Between Pharmacies for Initial  Filling"  ______________________________________________________________________       ______________________________________________________________________    Medication Rules  Purpose: To inform patients, and their family members, of our medication rules and regulations.  Applies to: All patients receiving prescriptions from our practice (written or electronic).  Pharmacy of record: This is the pharmacy where your electronic prescriptions will be sent. Make sure we have the correct one.  Electronic prescriptions: In compliance with the [facility] Strengthen Opioid Misuse Prevention (STOP) Act of 2017 (Session Conni Elliot [phone]), effective February 11, 2018, all controlled substances must be electronically prescribed. Written prescriptions, faxing, or calling prescriptions to a pharmacy will no longer be done.  Prescription refills: These will be provided only during in-person appointments. No medications will be renewed without a "face-to-face" evaluation with your provider. Applies to all prescriptions.  NOTE: The following applies primarily to controlled substances (Opioid* Pain Medications).   Type of encounter (visit): For patients receiving controlled substances, face-to-face visits are required. (Not an option and not up to the patient.)  Patient's Responsibilities: Pain Pills: Bring all pain pills to every appointment (except for procedure appointments). Pill counts are required.  Pill Bottles: Bring pills in original pharmacy bottle. Bring bottle, even if empty. Always bring the bottle of the most recent fill.  Medication refills: You are responsible for knowing and keeping track of what medications you are taking and when is it that you will need a refill. The day before your appointment: write a list of all prescriptions that need to be refilled. The day of the appointment: give the list to  the admitting nurse. Prescriptions will be written only during appointments. No  prescriptions will be written on procedure days. If you forget a medication: it will not be "Called in", "Faxed", or "electronically sent". You will need to get another appointment to get these prescribed. No early refills. Do not call asking to have your prescription filled early. Partial  or short prescriptions: Occasionally your pharmacy may not have enough pills to fill your prescription.  NEVER ACCEPT a partial fill or a prescription that is short of the total amount of pills that you were prescribed.  With controlled substances the law allows 72 hours for the pharmacy to complete the prescription.  If the prescription is not completed within 72 hours, the pharmacist will require a new prescription to be written. This means that you will be short on your medicine and we WILL NOT send another prescription to complete your original prescription.  Instead, request the pharmacy to send a carrier to a nearby branch to get enough medication to provide you with your full prescription. Prescription Accuracy: You are responsible for carefully inspecting your prescriptions before leaving our office. Have the discharge nurse carefully go over each prescription with you, before taking them home. Make sure that your name is accurately spelled, that your address is correct. Check the name and dose of your medication to make sure it is accurate. Check the number of pills, and the written instructions to make sure they are clear and accurate. Make sure that you are given enough medication to last until your next medication refill appointment. Taking Medication: Take medication as prescribed. When it comes to controlled substances, taking less pills or less frequently than prescribed is permitted and encouraged. Never take more pills than instructed. Never take the medication more frequently than prescribed.  Inform other Doctors: Always inform, all of your healthcare providers, of all the medications you take. Pain  Medication from other Providers: You are not allowed to accept any additional pain medication from any other Doctor or Healthcare provider. There are two exceptions to this rule. (see below) In the event that you require additional pain medication, you are responsible for notifying us, as stated below. Cough Medicine: Often these contain an opioid, such as codeine or hydrocodone. Never accept or take cough medicine containing these opioids if you are already taking an opioid* medication. The combination may cause respiratory failure and death. Medication Agreement: You are responsible for carefully reading and following our Medication Agreement. This must be signed before receiving any prescriptions from our practice. Safely store a copy of your signed Agreement. Violations to the Agreement will result in no further prescriptions. (Additional copies of our Medication Agreement are available upon request.) Laws, Rules, & Regulations: All patients are expected to follow all [address] and [employer], Rules, [place] [employer]. Ignorance of the Laws does not constitute a valid excuse.  Illegal drugs and Controlled Substances: The use of illegal substances (including, but not limited to marijuana and its derivatives) and/or the illegal use of any controlled substances is strictly prohibited. Violation of this rule may result in the immediate and permanent discontinuation of any and all prescriptions being written by our practice. The use of any illegal substances is prohibited. Adopted CDC guidelines & recommendations: Target dosing levels will be at or below 60 MME/day. Use of benzodiazepines** is not recommended. Urine Drug testing: Patients taking controlled substances will be required to provide a urine sample upon request. Do not void before coming to your medication management appointments.  Hold emptying your bladder until you are admitted. The admitting nurse will inform you if a sample is required. Our  practice reserves the right to call you at any time to provide a sample. Once receiving the call, you have 24 hours to comply with request. Not providing a sample upon request may result in termination of medication therapy.  Exceptions: There are only two exceptions to the rule of not receiving pain medications from other Healthcare Providers. Exception #1 (Emergencies): In the event of an emergency (i.e.: accident requiring emergency care), you are allowed to receive additional pain medication. However, you are responsible for: As soon as you are able, call our office [phone], at any time of the day or night, and leave a message stating your name, the date and nature of the emergency, and the name and dose of the medication prescribed. In the event that your call is answered by a member of our staff, make sure to document and save the date, time, and the name of the person that took your information.  Exception #2 (Planned Surgery): In the event that you are scheduled by another doctor or dentist to have any type of surgery or procedure, you are allowed (for a period no longer than 30 days), to receive additional pain medication, for the acute post-op pain. However, in this case, you are responsible for picking up a copy of our "Post-op Pain Management for Surgeons" handout, and giving it to your surgeon or dentist. This document is available at our office, and does not require an appointment to obtain it. Simply go to our office during business hours (Monday-Thursday from 8:00 AM to 4:00 PM) (Friday 8:00 AM to 12:00 Noon) or if you have a scheduled appointment with [place], prior to your surgery, and ask for it by name. In addition, you are responsible for: calling our office [phone], at any time of the day or night, and leaving a message stating your name, name of your surgeon, type of surgery, and date of procedure or surgery. Failure to comply with your responsibilities may result in termination  of therapy involving the controlled substances.  Consequences:  Non-compliance with the above rules may result in permanent discontinuation of medication prescription therapy. All patients receiving any type of controlled substance is expected to comply with the above patient responsibilities. Not doing so may result in permanent discontinuation of medication prescription therapy. Medication Agreement Violation. Following the above rules, including your responsibilities will help you in avoiding a Medication Agreement Violation ("Breaking your Pain Medication Contract").  *Opioid medications include: morphine, codeine, oxycodone, oxymorphone, hydrocodone, hydromorphone, meperidine, tramadol, tapentadol, buprenorphine, fentanyl, methadone. **Benzodiazepine medications include: diazepam (Valium), alprazolam (Xanax), clonazepam (Klonopine), lorazepam (Ativan), clorazepate (Tranxene), chlordiazepoxide (Librium), estazolam (Prosom), oxazepam (Serax), temazepam (Restoril), triazolam (Halcion) (Last updated: 12/04/2022) ______________________________________________________________________      ______________________________________________________________________    Medication Recommendations and Reminders  Applies to: All patients receiving prescriptions (written and/or electronic).  Medication Rules & Regulations: You are responsible for reading, knowing, and following our "Medication Rules" document. These exist for your safety and that of others. They are not flexible and neither are we. Dismissing or ignoring them is an act of "non-compliance" that may result in complete and irreversible termination of such medication therapy. For safety reasons, "non-compliance" will not be tolerated. As with the [place] fundamental legal principle of "ignorance of the law is no defense", we will accept no excuses for not having read and knowing the content of documents provided to you by  our practice.  Pharmacy of  record:  Definition: This is the pharmacy where your electronic prescriptions will be sent.  We do not endorse any particular pharmacy. It is up to you and your insurance to decide what pharmacy to use.  We do not restrict you in your choice of pharmacy. However, once we write for your prescriptions, we will NOT be re-sending more prescriptions to fix restricted supply problems created by your pharmacy, or your insurance.  The pharmacy listed in the electronic medical record should be the one where you want electronic prescriptions to be sent. If you choose to change pharmacy, simply notify our nursing staff. Changes will be made only during your regular appointments and not over the phone.  Recommendations: Keep all of your pain medications in a safe place, under lock and key, even if you live alone. We will NOT replace lost, stolen, or damaged medication. We do not accept "Police Reports" as proof of medications having been stolen. After you fill your prescription, take 1 week's worth of pills and put them away in a safe place. You should keep a separate, properly labeled bottle for this purpose. The remainder should be kept in the original bottle. Use this as your primary supply, until it runs out. Once it's gone, then you know that you have 1 week's worth of medicine, and it is time to come in for a prescription refill. If you do this correctly, it is unlikely that you will ever run out of medicine. To make sure that the above recommendation works, it is very important that you make sure your medication refill appointments are scheduled at least 1 week before you run out of medicine. To do this in an effective manner, make sure that you do not leave the office without scheduling your next medication management appointment. Always ask the nursing staff to show you in your prescription , when your medication will be running out. Then arrange for the receptionist to get you a return appointment, at least  7 days before you run out of medicine. Do not wait until you have 1 or 2 pills left, to come in. This is very poor planning and does not take into consideration that we may need to cancel appointments due to bad weather, sickness, or emergencies affecting our staff. DO NOT ACCEPT A "Partial Fill": If for any reason your pharmacy does not have enough pills/tablets to completely fill or refill your prescription, do not allow for a "partial fill". The law allows the pharmacy to complete that prescription within 72 hours, without requiring a new prescription. If they do not fill the rest of your prescription within those 72 hours, you will need a separate prescription to fill the remaining amount, which we will NOT provide. If the reason for the partial fill is your insurance, you will need to talk to the pharmacist about payment alternatives for the remaining tablets, but again, DO NOT ACCEPT A PARTIAL FILL, unless you can trust your pharmacist to obtain the remainder of the pills within 72 hours.  Prescription refills and/or changes in medication(s):  Prescription refills, and/or changes in dose or medication, will be conducted only during scheduled medication management appointments. (Applies to both, written and electronic prescriptions.) No refills on procedure days. No medication will be changed or started on procedure days. No changes, adjustments, and/or refills will be conducted on a procedure day. Doing so will interfere with the diagnostic portion of the procedure. No phone refills. No medications will be "  called into the pharmacy". No Fax refills. No weekend refills. No Holliday refills. No after hours refills.  Remember:  Business hours are:  Monday to Thursday 8:00 AM to 4:00 PM Provider's Schedule: Delano Metz, MD - Appointments are:  Medication management: Monday and Wednesday 8:00 AM to 4:00 PM Procedure day: Tuesday and Thursday 7:30 AM to 4:00 PM Edward Jolly, MD -  Appointments are:  Medication management: Tuesday and Thursday 8:00 AM to 4:00 PM Procedure day: Monday and Wednesday 7:30 AM to 4:00 PM (Last update: 12/04/2021) ______________________________________________________________________      ______________________________________________________________________     Naloxone Nasal Spray  Why am I receiving this medication? [place] STOP ACT requires that all patients taking high dose opioids or at risk of opioids respiratory depression, be prescribed an opioid reversal agent, such as Naloxone (AKA: Narcan).  What is this medication? NALOXONE (nal OX one) treats opioid overdose, which causes slow or shallow breathing, severe drowsiness, or trouble staying awake. Call emergency services after using this medication. You may need additional treatment. Naloxone works by reversing the effects of opioids. It belongs to a group of medications called opioid blockers.  COMMON BRAND NAME(S): Kloxxado, Narcan  What should I tell my care team before I take this medication? They need to know if you have any of these conditions: Heart disease Substance use disorder An unusual or allergic reaction to naloxone, other medications, foods, dyes, or preservatives Pregnant or trying to get pregnant Breast-feeding  When to use this medication? This medication is to be used for the treatment of respiratory depression (less than 8 breaths per minute) secondary to opioid overdose.   How to use this medication? This medication is for use in the nose. Lay the person on their back. Support their neck with your hand and allow the head to tilt back before giving the medication. The nasal spray should be given into 1 nostril. After giving the medication, move the person onto their side. Do not remove or test the nasal spray until ready to use. Get emergency medical help right away after giving the first dose of this medication, even if the person wakes up. You  should be familiar with how to recognize the signs and symptoms of a narcotic overdose. If more doses are needed, give the additional dose in the other nostril. Talk to your care team about the use of this medication in children. While this medication may be prescribed for children as young as newborns for selected conditions, precautions do apply.  Naloxone Overdosage: If you think you have taken too much of this medicine contact a poison control center or emergency room at once.  NOTE: This medicine is only for you. Do not share this medicine with others.  What if I miss a dose? This does not apply.  What may interact with this medication? This is only used during an emergency. No interactions are expected during emergency use. This list may not describe all possible interactions. Give your health care provider a list of all the medicines, herbs, non-prescription drugs, or dietary supplements you use. Also tell them if you smoke, drink alcohol, or use illegal drugs. Some items may interact with your medicine.  What should I watch for while using this medication? Keep this medication ready for use in the case of an opioid overdose. Make sure that you have the phone number of your care team and local hospital ready. You may need to have additional doses of this medication. Each nasal spray contains  a single dose. Some emergencies may require additional doses. After use, bring the treated person to the nearest hospital or call 911. Make sure the treating care team knows that the person has received a dose of this medication. You will receive additional instructions on what to do during and after use of this medication before an emergency occurs.  What side effects may I notice from receiving this medication? Side effects that you should report to your care team as soon as possible: Allergic reactions--skin rash, itching, hives, swelling of the face, lips, tongue, or throat Side effects that  usually do not require medical attention (report these to your care team if they continue or are bothersome): Constipation Dryness or irritation inside the nose Headache Increase in blood pressure Muscle spasms Stuffy nose Toothache This list may not describe all possible side effects. Call your doctor for medical advice about side effects. You may report side effects to FDA at 1-800-FDA-1088.  Where should I keep my medication? Because this is an emergency medication, you should keep it with you at all times.  Keep out of the reach of children and pets. Store between 20 and 25 degrees C (68 and 77 degrees F). Do not freeze. Throw away any unused medication after the expiration date. Keep in original box until ready to use.  NOTE: This sheet is a summary. It may not cover all possible information. If you have questions about this medicine, talk to your doctor, pharmacist, or health care provider.   2023 Elsevier/Gold Standard (2020-10-06 00:00:00)  ______________________________________________________________________     ______________________________________________________________________    Muscle Spasms & Cramps  Cause(s):  Most common - vitamin and/or electrolyte (calcium, potassium, sodium, etc.) deficiencies. Post procedure - steroids (injected, oral, or inhaled) can make your kidneys excrete (loose) electrolytes. Most of the time this will not cause any symptoms however, if you happen to be borderline low on your electrolytes, it may temporarily triggering cramps & spasms.  Possible triggers: Sweating - causes loss of electrolytes thru the skin. Steroids - causes loss of electrolytes thru the urine.  Treatment: (over-the-counter)  Gatorade (or any other electrolyte-replenishing drink) - Take 1, 8 oz glass with each meal (3 times a day). Mechanism of action: Replenishes lost electrolytes. Magnesium 400 to 500 mg - Take 1 tablet twice a day (one with breakfast and one at  bedtime). If you have kidney disease talk to your primary care physician before taking any Magnesium. Mechanism of action: Magnesium is a natural muscle relaxant. Tonic Water with quinine - Take 1, 8 oz glass before bedtime.  Mechanism of action: Quinine is used to treat spasms.  Last Update: 08/22/2022  ______________________________________________________________________      ______________________________________________________________________    Pain Prevention Technique  Definition:   A technique used to minimize the effects of an activity known to cause inflammation or swelling, which in turn leads to an increase in pain.  Purpose: To prevent swelling from occurring. It is based on the fact that it is easier to prevent swelling from happening than it is to get rid of it, once it occurs.  Contraindications: Anyone with allergy or hypersensitivity to the recommended medications. Anyone taking anticoagulants (Blood Thinners) (e.g., Coumadin, Warfarin, Plavix, etc.). Patients in Renal Failure or having chronic kidney disease.  Technique: Before you undertake an activity known to cause pain, or a flare-up of your chronic pain, and before you experience any pain, do the following:  On a full stomach, take 4 (four) over the counter Ibuprofens  200mg  tablets (Motrin), for a total of 800 mg. In addition, take over the counter Magnesium 400 to 500 mg, before doing the activity.  Six (6) hours later, again on a full stomach, repeat the Ibuprofen. That night, take a warm shower and stretch under the running warm water.  This technique may be sufficient to abort the pain and discomfort before it happens. Keep in mind that it takes a lot less medication to prevent swelling than it takes to eliminate it once it occurs.   Last Update: 08/22/2022  ______________________________________________________________________     ______________________________________________________________________     OTC Supplements:   The following is a list of over-the-counter (OTC) supplements that have been found to have [employer]) studies suggesting that they may be of some benefits when used in moderation in some chronic pain-related conditions.  NOTE:  Always consult with your primary care provider and/or pharmacist before taking any OTC medications to make sure they will not interact with your current medications. Always use manufacturer's recommended dosage.  Supplement Possible benefit May be of benefit in treatment of   Turmeric/curcumin anti-inflammatory Joint and muscle aches and pain.  Glucosamine/chondroitin (triple strength) may slow loss of articular cartilage Joint pain.  Vitamin D-3* may suppress release of chemicals associated with inflammation. Increases tolerance to pain. Joint and muscle aches and pain.   Moringa(+) anti-inflammatory with mild analgesic effects Joint and muscle aches and pain.  Melatonin(+) Helps reset sleep cycle. Insomnia.  Vitamin B-12* may help keep nerves and blood cells healthy as well as maintaining function of nervous system Nerve pain (Burning pain)  Alpha-Lipoic-Acid (ALA)* antioxidant that may help with nerve health, pain, and blocking the activation of some inflammatory chemicals Diabetic neuropathy and metabolic syndrome  superoxide dismutase (SOD)** Currently being reviewed.   Tiger Balm Currently being reviewed.   hydrolyzed collagen peptides* Currently being reviewed.  Collagen supplementation may increases bone strength, density, and mass; may improve joint stiffness/mobility, and functionality; and may reduce joint pain. Possible chondroprotective effects. May help with protection of joint health.   Methylsulfonylmethane (MSM)* Currently being reviewed.   CBD(+) Currently being reviewed.   Delta-8 THC(+) Currently being reviewed.   *  Generally Recognized As Safe (GRAS) approved substance.-FDA  ([employer]) ** "Possibly Safe", but not considered Generally Recognized As Safe (Not GRAS) by the [place] (FDA) as a food additive. (+) Not considered Generally Recognized As Safe (Not GRAS) by the [employer] (FDA) as a food additive.  ______________________________________________________________________

## 2023-01-13 ENCOUNTER — Ambulatory Visit: Payer: BC Managed Care – PPO | Attending: Pain Medicine | Admitting: Pain Medicine

## 2023-01-13 ENCOUNTER — Encounter: Payer: Self-pay | Admitting: Pain Medicine

## 2023-01-13 VITALS — BP 137/80 | HR 86 | Temp 97.4°F | Resp 16 | Ht 63.5 in | Wt 260.0 lb

## 2023-01-13 DIAGNOSIS — G4731 Primary central sleep apnea: Secondary | ICD-10-CM | POA: Insufficient documentation

## 2023-01-13 DIAGNOSIS — G894 Chronic pain syndrome: Secondary | ICD-10-CM | POA: Insufficient documentation

## 2023-01-13 DIAGNOSIS — M79601 Pain in right arm: Secondary | ICD-10-CM | POA: Diagnosis not present

## 2023-01-13 DIAGNOSIS — Z79899 Other long term (current) drug therapy: Secondary | ICD-10-CM | POA: Insufficient documentation

## 2023-01-13 DIAGNOSIS — Z9189 Other specified personal risk factors, not elsewhere classified: Secondary | ICD-10-CM | POA: Diagnosis not present

## 2023-01-13 DIAGNOSIS — M542 Cervicalgia: Secondary | ICD-10-CM | POA: Insufficient documentation

## 2023-01-13 DIAGNOSIS — Z969 Presence of functional implant, unspecified: Secondary | ICD-10-CM | POA: Insufficient documentation

## 2023-01-13 DIAGNOSIS — M79602 Pain in left arm: Secondary | ICD-10-CM | POA: Insufficient documentation

## 2023-01-13 DIAGNOSIS — Z79891 Long term (current) use of opiate analgesic: Secondary | ICD-10-CM | POA: Diagnosis not present

## 2023-01-13 DIAGNOSIS — M25551 Pain in right hip: Secondary | ICD-10-CM | POA: Insufficient documentation

## 2023-01-13 DIAGNOSIS — M5459 Other low back pain: Secondary | ICD-10-CM | POA: Diagnosis not present

## 2023-01-13 DIAGNOSIS — M25552 Pain in left hip: Secondary | ICD-10-CM | POA: Diagnosis not present

## 2023-01-13 DIAGNOSIS — G8929 Other chronic pain: Secondary | ICD-10-CM | POA: Diagnosis not present

## 2023-01-13 DIAGNOSIS — M47816 Spondylosis without myelopathy or radiculopathy, lumbar region: Secondary | ICD-10-CM | POA: Diagnosis not present

## 2023-01-13 MED ORDER — BUPRENORPHINE 10 MCG/HR TD PTWK: 1.0000 | MEDICATED_PATCH | TRANSDERMAL | 0 refills | Status: DC

## 2023-01-13 NOTE — Progress Notes (Signed)
Nursing Pain Medication Assessment:  Safety precautions to be maintained throughout the outpatient stay will include: orient to surroundings, keep bed in low position, maintain call bell within reach at all times, provide assistance with transfer out of bed and ambulation.  Medication Inspection Compliance: Pill count conducted under aseptic conditions, in front of the patient. Neither the pills nor the bottle was removed from the patient's sight at any time. Once count was completed pills were immediately returned to the patient in their original bottle.  Medication: Buprenorphine (Suboxone) Pill/Patch Count:  3 of 4 patches remain Pill/Patch Appearance: Markings consistent with prescribed medication Bottle Appearance: Standard pharmacy container. Clearly labeled. Filled Date: 28 / 19 / 2024 Last Medication intake:   place 01/08/2024

## 2023-01-15 ENCOUNTER — Ambulatory Visit: Payer: BC Managed Care – PPO

## 2023-02-03 ENCOUNTER — Ambulatory Visit: Payer: BC Managed Care – PPO | Admitting: Medical-Surgical

## 2023-02-03 VITALS — BP 136/83 | HR 66 | Resp 20 | Ht 63.5 in | Wt 279.6 lb

## 2023-02-03 DIAGNOSIS — J069 Acute upper respiratory infection, unspecified: Secondary | ICD-10-CM | POA: Diagnosis not present

## 2023-02-03 DIAGNOSIS — H66002 Acute suppurative otitis media without spontaneous rupture of ear drum, left ear: Secondary | ICD-10-CM

## 2023-02-03 LAB — POCT INFLUENZA A/B
Influenza A, POC: NEGATIVE
Influenza B, POC: NEGATIVE

## 2023-02-03 LAB — POC COVID19 BINAXNOW: SARS Coronavirus 2 Ag: NEGATIVE

## 2023-02-03 LAB — POCT RAPID STREP A (OFFICE): Rapid Strep A Screen: NEGATIVE

## 2023-02-03 MED ORDER — PROMETHAZINE-DM 6.25-15 MG/5ML PO SYRP: 5.0000 mL | ORAL_SOLUTION | Freq: Four times a day (QID) | ORAL | 0 refills | Status: DC | PRN

## 2023-02-03 MED ORDER — AZITHROMYCIN 250 MG PO TABS: ORAL_TABLET | ORAL | 0 refills | Status: AC

## 2023-02-03 NOTE — Progress Notes (Signed)
        Established patient visit  History, exam, impression, and plan:  1. Viral URI with cough (Primary) Pleasant 47 year old female presenting today with reports of 2 days of upper respiratory symptoms with sudden onset that started with left ear pain, cough, sinus congestion, facial pain, headache, fatigue, chills and significantly sore throat.  She has been using Sudafed, Mucinex, and several other over-the-counter options with no benefit.  See below for physical exam.  Adding Promethazine DM for cough management.  Discussed continued symptomatic treatment.  POCT strep, flu, and COVID negative.  Advised patient that if symptoms are not better by Friday, she should message me via MyChart and we will add a prednisone burst.  2. Non-recurrent acute suppurative otitis media of left ear without spontaneous rupture of tympanic membrane On exam, the left ear canal and TM are erythematous with the canal slightly bulging, cloudy.  Treating with azithromycin due to allergy to multiple other antibiotics.  Procedures performed this visit: None.  Return if symptoms worsen or fail to improve.  __________________________________ Thayer Ohm, DNP, APRN, FNP-BC Primary Care and Sports Medicine [facility] [place]

## 2023-02-03 NOTE — Addendum Note (Signed)
Addended by: Latanya Presser on: 02/03/2023 04:41 PM   Modules accepted: Orders

## 2023-02-06 ENCOUNTER — Other Ambulatory Visit: Payer: Self-pay | Admitting: Medical-Surgical

## 2023-02-06 ENCOUNTER — Encounter: Payer: Self-pay | Admitting: Medical-Surgical

## 2023-02-06 MED ORDER — METHYLPREDNISOLONE 4 MG PO TBPK: ORAL_TABLET | ORAL | 0 refills | Status: DC

## 2023-02-06 MED ORDER — BENZONATATE 200 MG PO CAPS: 200.0000 mg | ORAL_CAPSULE | Freq: Two times a day (BID) | ORAL | 0 refills | Status: DC | PRN

## 2023-02-06 MED ORDER — DULOXETINE HCL 60 MG PO CPEP: 60.0000 mg | ORAL_CAPSULE | Freq: Two times a day (BID) | ORAL | 1 refills | Status: DC

## 2023-02-06 NOTE — Addendum Note (Signed)
Addended byChristen Butter on: 02/06/2023 04:49 PM   Modules accepted: Orders

## 2023-02-20 ENCOUNTER — Encounter: Payer: Self-pay | Admitting: Family Medicine

## 2023-02-20 ENCOUNTER — Ambulatory Visit: Payer: BC Managed Care – PPO | Admitting: Family Medicine

## 2023-02-20 VITALS — BP 115/76 | HR 88 | Ht 63.5 in | Wt 272.5 lb

## 2023-02-20 DIAGNOSIS — M797 Fibromyalgia: Secondary | ICD-10-CM | POA: Diagnosis not present

## 2023-02-20 MED ORDER — METHYLPREDNISOLONE SODIUM SUCC 125 MG IJ SOLR
125.0000 mg | Freq: Once | INTRAMUSCULAR | Status: AC
  Administered 2023-02-20: 125 mg via INTRAMUSCULAR

## 2023-02-20 NOTE — Progress Notes (Signed)
   Acute Office Visit  Subjective:     Patient ID: Mary Cruz, female    DOB: [birth year], 48 y.o.   MRN: [id number]  Chief Complaint  Patient presents with   Fibromyalgia    Pt comes in today with complaints of her fibromyalgia flaring up since tues     HPI Patient is in today for fibromyalgia flare up. She is currently on cymbalta  60mg  and robaxin  750mg  for management. She admits to aches and focus issues as well.   Review of Systems  Constitutional:  Negative for chills and fever.  Respiratory:  Negative for cough and shortness of breath.   Cardiovascular:  Negative for chest pain.  Musculoskeletal:  Positive for myalgias.  Neurological:  Negative for headaches.        Objective:    BP 115/76 (BP Location: Left Arm, Patient Position: Sitting, Cuff Size: Large)   Pulse 88   Ht 5' 3.5 (1.613 m)   Wt 272 lb 8 oz (123.6 kg)   SpO2 96%   BMI 47.51 kg/m    Physical Exam Vitals reviewed.  Constitutional:      Appearance: She is well-developed.  HENT:     Head: Normocephalic and atraumatic.  Eyes:     Conjunctiva/sclera: Conjunctivae normal.  Cardiovascular:     Rate and Rhythm: Normal rate.  Pulmonary:     Effort: Pulmonary effort is normal.  Skin:    General: Skin is dry.     Coloration: Skin is not pale.  Neurological:     Mental Status: She is alert and oriented to person, place, and time.  Psychiatric:        Behavior: Behavior normal.     No results found for any visits on 02/20/23.      Assessment & Plan:   Problem List Items Addressed This Visit       Other   Fibromyalgia - Primary (Chronic)   Pt presents for fibro flare and admits to myalgias.  - will go ahead and do solumedrol 125mg  in clinic today and follow with steroids tomorrow to help with the acute inflammation - recommend pt try to stretch as able and keep moving to help quicken relief. She denies any chest pain, shortness of breath or flu like symptoms.  - continue cymbalta   and robaxin       Relevant Medications   methylPREDNISolone  sodium succinate (SOLU-MEDROL ) 125 mg/2 mL injection 125 mg     Meds ordered this encounter  Medications   methylPREDNISolone  sodium succinate (SOLU-MEDROL ) 125 mg/2 mL injection 125 mg    Return if symptoms worsen or fail to improve with PCP.  Bernice GORMAN [place], DO

## 2023-02-20 NOTE — Assessment & Plan Note (Addendum)
 Pt presents for fibro flare and admits to myalgias.  - will go ahead and do solumedrol 125mg  in clinic today and follow with steroids tomorrow to help with the acute inflammation - recommend pt try to stretch as able and keep moving to help quicken relief. She denies any chest pain, shortness of breath or flu like symptoms.  - continue cymbalta  and robaxin 

## 2023-02-28 ENCOUNTER — Encounter: Payer: Self-pay | Admitting: Medical-Surgical

## 2023-03-07 ENCOUNTER — Other Ambulatory Visit: Payer: Self-pay | Admitting: Medical-Surgical

## 2023-03-07 ENCOUNTER — Ambulatory Visit: Payer: BC Managed Care – PPO | Admitting: Medical-Surgical

## 2023-03-07 ENCOUNTER — Encounter: Payer: Self-pay | Admitting: Medical-Surgical

## 2023-03-07 VITALS — BP 110/73 | HR 85 | Resp 20 | Ht 63.5 in | Wt 276.7 lb

## 2023-03-07 DIAGNOSIS — N951 Menopausal and female climacteric states: Secondary | ICD-10-CM

## 2023-03-07 DIAGNOSIS — G2581 Restless legs syndrome: Secondary | ICD-10-CM

## 2023-03-07 DIAGNOSIS — F419 Anxiety disorder, unspecified: Secondary | ICD-10-CM

## 2023-03-07 DIAGNOSIS — F32A Depression, unspecified: Secondary | ICD-10-CM

## 2023-03-07 DIAGNOSIS — I10 Essential (primary) hypertension: Secondary | ICD-10-CM

## 2023-03-07 DIAGNOSIS — G1220 Motor neuron disease, unspecified: Secondary | ICD-10-CM | POA: Diagnosis not present

## 2023-03-07 MED ORDER — LISINOPRIL-HYDROCHLOROTHIAZIDE 10-12.5 MG PO TABS: 1.0000 | ORAL_TABLET | Freq: Every day | ORAL | 1 refills | Status: DC

## 2023-03-07 MED ORDER — VEOZAH 45 MG PO TABS: 45.0000 mg | ORAL_TABLET | Freq: Every day | ORAL | 1 refills | Status: DC

## 2023-03-07 MED ORDER — ROPINIROLE HCL 0.25 MG PO TABS: 0.2500 mg | ORAL_TABLET | Freq: Every day | ORAL | 0 refills | Status: DC

## 2023-03-07 MED ORDER — BUSPIRONE HCL 5 MG PO TABS: 5.0000 mg | ORAL_TABLET | Freq: Two times a day (BID) | ORAL | 1 refills | Status: DC | PRN

## 2023-03-07 NOTE — Progress Notes (Unsigned)
        Established patient visit  History, exam, impression, and plan:  1. 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 Reports a family history of MS in two remote relatives. Has been experiencing leg cramps, RLS, lower extremity weakness, loss of bladder control, blurry vision for 2 months, upper extremity paresthesias, poor balance, chronic pain, vertigo, facial sensory changes, insomnia, and intermittent difficulty speaking clearly at times. Feels that the symptoms have periods of remission and exacerbation. Plan for MR brain for further investigation.  - MR Brain Wo Contrast; Future  2. Essential hypertension, benign Taking Lisinopril-hydrochlorothiazide 10-12.5mg  daily, tolerating well without side effects. BP at goal today. No concerning symptoms reported outside of those noted above. Continue Lisinopril-hydrochlorothiazide as prescribed.  - lisinopril-hydrochlorothiazide (ZESTORETIC) 10-12.5 MG tablet; Take 1 tablet by mouth daily.  Dispense: 90 tablet; Refill: 1  3. RLS (restless legs syndrome) Taking Requip 0.25mg  nightly and reports that it is working well to control her symptoms. Tolerating well, no SE. Continue Requip as prescribed.  - rOPINIRole (REQUIP) 0.25 MG tablet; Take 1 tablet (0.25 mg total) by mouth at bedtime.  Dispense: 90 tablet; Refill: 0  4. Anxiety and depression Taking Buspar 5-15mg  twice daily as needed. Feels this helps with anxiety and stress management. Denies SI/HI. Refilling Buspar today.  - busPIRone (BUSPAR) 5 MG tablet; Take 1-3 tablets (5-15 mg total) by mouth 2 (two) times daily as needed.  Dispense: 180 tablet; Refill: 1  5. Vasomotor symptoms due to menopause Had significant difficulty with hot flashes and night sweats. Started Veozah and reports that it has been a great help. Refilling today.  - Fezolinetant (VEOZAH) 45 MG TABS; Take 1  tablet (45 mg total) by mouth daily.  Dispense: 90 tablet; Refill: 1   Procedures performed this visit: None.  Return if symptoms worsen or fail to improve.  __________________________________ Thayer Ohm, DNP, APRN, FNP-BC Primary Care and Sports Medicine [facility]

## 2023-03-09 ENCOUNTER — Encounter: Payer: Self-pay | Admitting: Medical-Surgical

## 2023-03-11 ENCOUNTER — Encounter (INDEPENDENT_AMBULATORY_CARE_PROVIDER_SITE_OTHER): Payer: Self-pay

## 2023-03-29 ENCOUNTER — Ambulatory Visit: Payer: BC Managed Care – PPO

## 2023-04-04 ENCOUNTER — Ambulatory Visit (HOSPITAL_BASED_OUTPATIENT_CLINIC_OR_DEPARTMENT_OTHER)
Admission: RE | Admit: 2023-04-04 | Discharge: 2023-04-04 | Disposition: A | Discharge status: Home / Self Care | Payer: BC Managed Care – PPO | Source: Ambulatory Visit | Attending: Family Medicine | Admitting: Family Medicine

## 2023-04-04 ENCOUNTER — Ambulatory Visit: Payer: Self-pay | Admitting: Medical-Surgical

## 2023-04-04 ENCOUNTER — Ambulatory Visit (INDEPENDENT_AMBULATORY_CARE_PROVIDER_SITE_OTHER)
Admit: 2023-04-04 | Discharge: 2023-04-04 | Disposition: A | Discharge status: Home / Self Care | Payer: BC Managed Care – PPO | Attending: Family Medicine | Admitting: Family Medicine

## 2023-04-04 ENCOUNTER — Encounter (HOSPITAL_BASED_OUTPATIENT_CLINIC_OR_DEPARTMENT_OTHER): Payer: Self-pay

## 2023-04-04 VITALS — BP 130/82 | HR 95 | Temp 98.1°F | Resp 20

## 2023-04-04 DIAGNOSIS — R0789 Other chest pain: Secondary | ICD-10-CM

## 2023-04-04 DIAGNOSIS — R059 Cough, unspecified: Secondary | ICD-10-CM

## 2023-04-04 DIAGNOSIS — R918 Other nonspecific abnormal finding of lung field: Secondary | ICD-10-CM | POA: Diagnosis not present

## 2023-04-04 DIAGNOSIS — J189 Pneumonia, unspecified organism: Secondary | ICD-10-CM

## 2023-04-04 DIAGNOSIS — R051 Acute cough: Secondary | ICD-10-CM

## 2023-04-04 MED ORDER — PREDNISONE 20 MG PO TABS: 20.0000 mg | ORAL_TABLET | Freq: Every day | ORAL | 0 refills | Status: AC

## 2023-04-04 MED ORDER — IPRATROPIUM-ALBUTEROL 0.5-2.5 (3) MG/3ML IN SOLN
3.0000 mL | Freq: Once | RESPIRATORY_TRACT | Status: AC
  Administered 2023-04-04: 3 mL via RESPIRATORY_TRACT

## 2023-04-04 MED ORDER — ALBUTEROL SULFATE (2.5 MG/3ML) 0.083% IN NEBU: 2.5000 mg | INHALATION_SOLUTION | RESPIRATORY_TRACT | 0 refills | Status: DC | PRN

## 2023-04-04 MED ORDER — LEVOFLOXACIN 500 MG PO TABS: 500.0000 mg | ORAL_TABLET | Freq: Every day | ORAL | 0 refills | Status: AC

## 2023-04-04 MED ORDER — ALBUTEROL SULFATE HFA 108 (90 BASE) MCG/ACT IN AERS: 2.0000 | INHALATION_SPRAY | Freq: Four times a day (QID) | RESPIRATORY_TRACT | 0 refills | Status: DC | PRN

## 2023-04-04 NOTE — ED Triage Notes (Signed)
 Pt c/o coughing, chest congestion, chest discomfort started 2 days ago. x 1 week ago on Wednesday her whole family had the flu was not seen by a doctor she assumed since all the family was sick.

## 2023-04-04 NOTE — Progress Notes (Signed)
 Chest X-Ray IMPRESSION:  New mild streaky opacity at right lung base, suspicious for pneumonia.  Patient was treated for Community Acquired Pneumonia during her visit and updated on her Chest X-Ray results now.

## 2023-04-04 NOTE — ED Provider Notes (Signed)
 [facility]    CSN: [id number] Arrival date & time: 04/04/23  1147      History   Chief Complaint Chief Complaint  Patient presents with   Cough    Nurse said I needed to be seen for bronchitis - Entered by patient    HPI Mary Cruz is a 48 y.o. female.   Patient's children and the patient was sick Wednesday, 03/26/2023.  Her children were positive for flu.  The patient felt so bad she did not go to the doctor to get checked.  She eventually started feeling better but then her cough got much worse quickly.  She has having cough and chest congestion and wheezing.  She is intermittently short of breath.  Her oxygen saturation on room air is 91%.   Cough Associated symptoms: shortness of breath and wheezing   Associated symptoms: no chest pain, no chills, no ear pain, no fever, no rash and no sore throat     Past Medical History:  Diagnosis Date   Abnormal MRI, lumbar spine (11/04/2021) 11/07/2021   (11/04/2021) LUMBAR MRI FINDINGS:  DISC LEVELS:  L4-L5: Mild facet arthropathy. Left far lateral/extraforaminal disc protrusion with mild resulting left foraminal stenosis.  L5-S1: Small left foraminal disc protrusion. Bilateral facet arthropathy.     IMPRESSION:  Lower lumbar degenerative change without impingement.     Abnormal MRI, shoulder (Right) 11/15/2018   FINDINGS:  Rotator cuff: Mild to moderate supraspinatus tendinopathy potentially with mild partial-thickness bursal surface tearing of the supraspinatus. Mild infraspinatus tendinopathy.  Muscles:  Unremarkable  Biceps long head:  Unremarkable  Acromioclavicular Joint: Mild degenerative spurring with a small amount of fluid signal in the [facility] joint. Type I acromion. 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  08/19/2022   Cervicalgia 11/15/2018   Chronic cervical radicular pain (C7 Dermatome) (Bilateral) (L>R) 12/19/2014   Chronic feet pain (Bilateral) 10/30/2020   Chronic hip pain (3ry area of Pain) (Bilateral) (R>L) 08/20/2018   Chronic hip pain (Right) 06/13/2021   Chronic low back pain (Bilateral) (R>L) w/ sciatica (Bilateral) (R>L) 06/21/2019   Chronic low back pain (Bilateral) (R>L) w/o sciatica 12/19/2014   Chronic lower extremity pain (Bilateral) (R>L) 12/19/2014   Chronic lower extremity pain (Right) 06/21/2019   Chronic lumbar radicular pain (Right L5 dermatome; Left S1 Dermatome) (Bilateral) (R>L) 12/19/2014   Right L5 radicular pain. Left S1 radicular pain.     Chronic migraine w/o aura w/o status migrainosus, not intractable 05/15/2022   Chronic musculoskeletal pain 12/19/2014   Chronic neck pain (1ry area of Pain) (Bilateral) (L>R) 12/19/2014   Chronic pain syndrome 07/09/2013   Chronic upper extremity pain (2ry area of Pain) (Bilateral) (L>R) 08/20/2018   Chronic use of opiate for therapeutic purpose 06/27/2020   Complaints of weakness of lower extremities (Bilateral) 11/07/2021   DDD (degenerative disc disease), cervical 11/15/2018   DDD (degenerative disc disease), lumbar 02/25/2019   Disorder of skeletal system 08/20/2018   Dropfoot (Right) 06/21/2019   Essential hypertension, benign 07/09/2013   Fibromyalgia    Gastroesophageal reflux disease 07/02/2007   Qualifier: Diagnosis of   By: Melvyn Neth CMA (AAMA), Patty         Generalized anxiety disorder 03/16/2014   Grade 1 Retrolisthesis of L4/L5 07/31/2021   Hypertension    Insomnia secondary  to chronic pain 06/13/2022   Interstitial cystitis 05/04/2020   Intractable low back pain 04/25/2020   Irritable bowel syndrome with both constipation and diarrhea 07/02/2007   Qualifier: Diagnosis of   By: Melvyn Neth CMA (AAMA), Patty         Latex precautions, history of latex allergy 04/08/2019   Long term prescription opiate use  12/19/2014   Loss of consciousness (HCC) 06/13/2022   Lower extremity numbness (Bilateral) 11/07/2021   Lumbar facet arthropathy 06/21/2019   Lumbar facet syndrome (Bilateral) (R>L) 06/29/2015   Lumbar spondylosis 06/29/2015   Lumbosacral radiculopathy at L5 (Right) 06/21/2019   Lumbosacral radiculopathy/radiculitis at L4 (Right) 07/31/2021   Metabolic syndrome 08/04/2020   Migraines 11/12/2019   Mixed hyperlipidemia 08/04/2020   Morbid obesity (HCC) 12/19/2014   Neurogenic pain 10/10/2016   Nocturia more than twice per night 06/13/2022   Osteoarthritis    Osteoarthritis involving multiple joints 04/08/2019   Osteoarthritis of hip (Right) 07/16/2021   Osteoarthrosis 12/19/2014   Pain    chronic regional pain syndrome   Pain in right knee 03/19/2017   Paresthesia 02/05/2022   Perimenopausal vasomotor symptoms 05/22/2022   Pharmacologic therapy 08/20/2018   Positive ANA (antinuclear antibody) 09/13/2015   Following with Azucena Fallen, PA at [facility] rheumatology. Recently seen for second opinion 10/03/2016 for evaluation of connective tissue disease and/or lupus. Prior serology show positive ANA without positive lupus specific antibodies.[name] Response to Plaquenil and as needed prednisone in the past. Intends do not meet clinical criteria for lupus. If specific ANA panel negative, no further workup   Prediabetes 10/19/2020   Presence of functional implant (Medtronic Lumbar spinal cord stimulator implant) 12/19/2014   Non-MRI compatible system. 08/2007.     Refractory chronic cough 06/13/2022   Restless leg syndrome 10/06/2022   Spinal cord stimulator status 05/16/2016   Spondylolisthesis at L4-L5 level 07/31/2021   Spondylosis without myelopathy or radiculopathy, lumbosacral region 02/25/2019   Uncomplicated opioid dependence (HCC) 12/19/2014   Vitamin D insufficiency 12/19/2014    Patient Active Problem List   Diagnosis Date Noted   Mild aortic insufficiency by TTE 11/21/2012  11/25/2022   Inappropriate sinus tachycardia (HCC) 7d zio patch 10/11/2022 avg HR 93/min, sx correlating with sinus tachycardia 11/25/2022   Unspecified inflammatory spondylopathy, lumbar region (HCC) 10/24/2022   Syncope and collapse 10/21/2022   Postural dizziness 10/21/2022   Hypertension    Osteoarthritis    Pain    Restless leg syndrome 10/06/2022   Central sleep apnea comorbid with prescribed opioid use 08/19/2022   At risk for respiratory depression due to opioid 06/13/2022   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 pain 06/13/2022   Nocturia more than twice per night 06/13/2022   Loss of consciousness (HCC) 06/13/2022   Refractory chronic cough 06/13/2022   Perimenopausal vasomotor symptoms 05/22/2022   Chronic migraine w/o aura w/o status migrainosus, not intractable 05/15/2022   Paresthesia 02/05/2022   Lower extremity numbness (Bilateral) 11/07/2021   Complaints of weakness of lower extremities (Bilateral) 11/07/2021   Abnormal MRI, lumbar spine (11/04/2021) 11/07/2021   Grade 1 Retrolisthesis of L4/L5 07/31/2021   Spondylolisthesis at L4-L5 level 07/31/2021   Lumbosacral radiculopathy/radiculitis at L4 (Right) 07/31/2021   Osteoarthritis of hip (Right) 07/16/2021   Chronic hip pain (Right) 06/13/2021   Chronic feet pain (Bilateral) 10/30/2020   Prediabetes 10/19/2020   Metabolic syndrome 08/04/2020   Mixed hyperlipidemia 08/04/2020   Chronic use of opiate for therapeutic purpose 06/27/2020   Interstitial cystitis 05/04/2020   Intractable low back pain 04/25/2020  Migraines 11/12/2019   Chronic low back pain (Bilateral) (R>L) w/ sciatica (Bilateral) (R>L) 06/21/2019   Chronic lower extremity pain (Right) 06/21/2019   Lumbosacral radiculopathy at L5 (Right) 06/21/2019   Dropfoot (Right) 06/21/2019   Lumbar facet arthropathy 06/21/2019   Osteoarthritis involving multiple joints 04/08/2019   Latex precautions, history of latex allergy 04/08/2019   Spondylosis without myelopathy  or radiculopathy, lumbosacral region 02/25/2019   DDD (degenerative disc disease), lumbar 02/25/2019   DDD (degenerative disc disease), cervical 11/15/2018   Cervicalgia 11/15/2018   Abnormal MRI, shoulder (Right) 11/15/2018   Chronic upper extremity pain (2ry area of Pain) (Bilateral) (L>R) 08/20/2018   Chronic hip pain (3ry area of Pain) (Bilateral) (R>L) 08/20/2018   Pharmacologic therapy 08/20/2018   Disorder of skeletal system 08/20/2018   Pain in right knee 03/19/2017   Neurogenic pain 10/10/2016   Fibromyalgia 10/10/2016   Spinal cord stimulator status 05/16/2016   Battery end of life of spinal cord stimulator 05/16/2016   Positive ANA (antinuclear antibody) 09/13/2015   Lumbar spondylosis 06/29/2015   Lumbar facet syndrome (Bilateral) (R>L) 06/29/2015   Anxiety and depression 01/20/2015   Long term prescription opiate use 12/19/2014   Chronic musculoskeletal pain 12/19/2014   Osteoarthrosis 12/19/2014   Vitamin D insufficiency 12/19/2014   Presence of functional implant (Medtronic Lumbar spinal cord stimulator implant) 12/19/2014   Chronic low back pain (Bilateral) (R>L) w/o sciatica 12/19/2014   Chronic lumbar radicular pain (Right L5 dermatome; Left S1 Dermatome) (Bilateral) (R>L) 12/19/2014   Chronic neck pain (1ry area of Pain) (Bilateral) (L>R) 12/19/2014   Morbid obesity (HCC) 12/19/2014   Chronic cervical radicular pain (C7 Dermatome) (Bilateral) (L>R) 12/19/2014   Chronic lower extremity pain (Bilateral) (R>L) 12/19/2014   Generalized anxiety disorder 03/16/2014   Essential hypertension, benign 07/09/2013   Chronic pain syndrome 07/09/2013   Gastroesophageal reflux disease 07/02/2007   Irritable bowel syndrome with both constipation and diarrhea 07/02/2007    Past Surgical History:  Procedure Laterality Date   ANKLE SURGERY     APPENDECTOMY     BREAST SURGERY     CHOLECYSTECTOMY  2000   COLONOSCOPY N/A 02/20/2021   Procedure: COLONOSCOPY;  Surgeon: Regis Bill, MD;  Location: [facility];  Service: Endoscopy;  Laterality: N/A;   COLONOSCOPY WITH ESOPHAGOGASTRODUODENOSCOPY (EGD)     ENDOMETRIAL ABLATION     ESOPHAGOGASTRODUODENOSCOPY N/A 02/20/2021   Procedure: ESOPHAGOGASTRODUODENOSCOPY (EGD);  Surgeon: Regis Bill, MD;  Location: [facility];  Service: Endoscopy;  Laterality: N/A;   KNEE SURGERY     KNEE SURGERY Right 02/2015   LAPAROSCOPIC APPENDECTOMY Right 01/01/2020   Procedure: APPENDECTOMY LAPAROSCOPIC;  Surgeon: Emelia Loron, MD;  Location: [facility] ORS;  Service: General;  Laterality: Right;   OCCIPITAL NERVE STIMULATOR INSERTION     SHOULDER ARTHROSCOPY WITH DISTAL CLAVICLE RESECTION Right 10/29/2018   Procedure: SHOULDER ARTHROSCOPY WITH DISTAL CLAVICLE RESECTION;  Surgeon: Bjorn Pippin, MD;  Location: [facility];  Service: Orthopedics;  Laterality: Right;   SHOULDER SURGERY     spinal cord stimulator     SPINAL CORD STIMULATOR IMPLANT  2008   SPINAL CORD STIMULATOR INSERTION N/A 06/21/2016   Procedure: LUMBAR SPINAL CORD STIMULATOR INSERTION;  Surgeon: Odette Fraction, MD;  Location: [facility] OR;  Service: Neurosurgery;  Laterality: N/A;  LUMBAR SPINAL CORD STIMULATOR INSERTION   SUBACROMIAL DECOMPRESSION Right 10/29/2018   Procedure: SUBACROMIAL DECOMPRESSION;  Surgeon: Bjorn Pippin, MD;  Location: [facility];  Service: Orthopedics;  Laterality: Right;   TUBAL  LIGATION  2000    OB History   No obstetric history on file.      Home Medications    Prior to Admission medications   Medication Sig Start Date End Date Taking? Authorizing Provider  DULoxetine (CYMBALTA) 60 MG capsule Take 1 capsule (60 mg total) by mouth 2 (two) times daily. 02/06/23  Yes Christen Butter, NP  levofloxacin (LEVAQUIN) 500 MG tablet Take 1 tablet (500 mg total) by mouth daily for 7 days. Take after food.  Take a probiotic daily for a month due to antibiotic use. 04/04/23 04/11/23 Yes Prescilla Sours, FNP   lisinopril-hydrochlorothiazide (ZESTORETIC) 10-12.5 MG tablet Take 1 tablet by mouth daily. 03/07/23  Yes Christen Butter, NP  methocarbamol (ROBAXIN) 750 MG tablet Take 1 tablet (750 mg total) by mouth every 8 (eight) hours as needed for muscle spasms. 05/21/22  Yes Christen Butter, NP  metoprolol tartrate (LOPRESSOR) 25 MG tablet Take 0.5 tablets (12.5 mg total) by mouth 2 (two) times daily. 11/26/22 04/04/23 Yes Madireddy, Marlyn Corporal, MD  pantoprazole (PROTONIX) 40 MG tablet Take 1 tablet (40 mg total) by mouth daily. 01/07/23  Yes Christen Butter, NP  predniSONE (DELTASONE) 20 MG tablet Take 1 tablet (20 mg total) by mouth daily with breakfast for 5 days. 04/04/23 04/09/23 Yes Prescilla Sours, FNP  rOPINIRole (REQUIP) 0.25 MG tablet Take 1 tablet (0.25 mg total) by mouth at bedtime. 03/07/23  Yes Christen Butter, NP  albuterol (PROVENTIL) (2.5 MG/3ML) 0.083% nebulizer solution Take 3 mLs (2.5 mg total) by nebulization every 4 (four) hours as needed for wheezing or shortness of breath. 04/04/23   Prescilla Sours, FNP  albuterol (VENTOLIN HFA) 108 (90 Base) MCG/ACT inhaler Inhale 2 puffs into the lungs every 6 (six) hours as needed for wheezing or shortness of breath. 04/04/23   Prescilla Sours, FNP  budesonide (PULMICORT) 0.25 MG/2ML nebulizer solution USE 2 ML(0.25 MG) VIA NEBULIZER IN THE MORNING AND AT BEDTIME 09/03/22   Christen Butter, NP  buprenorphine (BUTRANS) 10 MCG/HR PTWK Place 1 patch onto the skin once a week for 28 days. 01/15/23 02/12/23  Delano Metz, MD  buprenorphine (BUTRANS) 10 MCG/HR PTWK Place 1 patch onto the skin once a week for 28 days. 02/12/23 03/12/23  Delano Metz, MD  buprenorphine (BUTRANS) 10 MCG/HR PTWK Place 1 patch onto the skin once a week for 28 days. 03/12/23 04/09/23  Delano Metz, MD  busPIRone (BUSPAR) 5 MG tablet Take 1-3 tablets (5-15 mg total) by mouth 2 (two) times daily as needed. 03/07/23   Christen Butter, NP  clotrimazole-betamethasone (LOTRISONE) cream Apply 1 Application  topically daily. Use up to 14 days 10/24/22   Christen Butter, NP  famotidine (PEPCID) 20 MG tablet Take 1 tablet (20 mg total) by mouth 2 (two) times daily. 10/24/22   Christen Butter, NP  Fezolinetant (VEOZAH) 45 MG TABS Take 1 tablet (45 mg total) by mouth daily. 03/07/23   Christen Butter, NP  hyoscyamine (LEVSIN SL) 0.125 MG SL tablet Place 1 tablet (0.125 mg total) under the tongue daily as needed. 07/23/21   Christen Butter, NP  levocetirizine (XYZAL) 5 MG tablet Take 1 tablet (5 mg total) by mouth every evening. 06/27/22   Christen Butter, NP  meclizine (ANTIVERT) 25 MG tablet Take 1 tablet (25 mg total) by mouth 3 (three) times daily as needed for dizziness. 10/29/22   Christen Butter, NP  naloxone [facility]) nasal spray 4 mg/0.1 mL Place 1 spray into the nose as needed for up to 365 doses (for  opioid-induced respiratory depresssion). In case of emergency (overdose), spray once into each nostril. If no response within 3 minutes, repeat application and call 911. 08/19/22 08/19/23  Delano Metz, MD  naratriptan (AMERGE) 2.5 MG tablet Take 1 tablet (2.5 mg total) by mouth as needed for migraine. Take one (1) tablet at onset of headache; if returns or does not resolve, may repeat after 4 hours; do not exceed five (5) mg in 24 hours. 12/02/22   [name], NP  nortriptyline (PAMELOR) 10 MG capsule Take 3 capsules (30 mg total) by mouth at bedtime. 12/02/22   [name], NP  ondansetron (ZOFRAN) 4 MG tablet Take 1 tablet (4 mg total) by mouth every 8 (eight) hours as needed for nausea or vomiting. 12/02/22   [name], NP  Probiotic Product (ALIGN PO) Take 1 tablet by mouth daily.    [provider]  promethazine-dextromethorphan (PROMETHAZINE-DM) 6.25-15 MG/5ML syrup Take 5 mLs by mouth 4 (four) times daily as needed for cough. 02/03/23   Christen Butter, NP  Respiratory Therapy Supplies (FULL KIT NEBULIZER SET) MISC Nebulizer machine of patient's choice, please include tubes and hoses as needed.  Use as  directed. 05/30/22   Christen Butter, NP    Family History Family History  Problem Relation Age of Onset   Depression Mother        maternal grandmother   Asthma Mother    Fibromyalgia Other        aunt   Stroke Other        grandmother   Thyroid disease Other        grandmother    Social History Social History   Tobacco Use   Smoking status: Former    Current packs/day: 0.00    Types: Cigarettes    Quit date: 05/06/2020    Years since quitting: 2.9   Smokeless tobacco: Former    Quit date: 12/22/2017  Vaping Use   Vaping status: Every Day   Substances: Nicotine, Flavoring  Substance Use Topics   Alcohol use: No    Alcohol/week: 0.0 standard drinks of alcohol   Drug use: No     Allergies   Celecoxib, Doxycycline, Gabapentin, Latex, Nabumetone, Pentazocine lactate, Seroquel [quetiapine], Amoxicillin, Bactrim [sulfamethoxazole-trimethoprim], Cephalexin, Erythromycin, Hydromorphone hcl, and Lyrica [pregabalin]   Review of Systems Review of Systems  Constitutional:  Negative for chills and fever.  HENT:  Negative for ear pain and sore throat.   Eyes:  Negative for pain and visual disturbance.  Respiratory:  Positive for cough, chest tightness, shortness of breath and wheezing.   Cardiovascular:  Negative for chest pain and palpitations.  Gastrointestinal:  Negative for abdominal pain, constipation, diarrhea, nausea and vomiting.  Genitourinary:  Negative for dysuria and hematuria.  Musculoskeletal:  Negative for arthralgias and back pain.  Skin:  Negative for color change and rash.  Neurological:  Negative for seizures and syncope.  All other systems reviewed and are negative.    Physical Exam Triage Vital Signs ED Triage Vitals  Encounter Vitals Group     BP 04/04/23 1159 130/82     Systolic BP Percentile --      Diastolic BP Percentile --      Pulse Rate 04/04/23 1159 95     Resp 04/04/23 1159 20     Temp 04/04/23 1159 98.1 F (36.7 C)     Temp Source  04/04/23 1159 Oral     SpO2 04/04/23 1159 93 %     Weight --  Height --      Head Circumference --      Peak Flow --      Pain Score 04/04/23 1157 5     Pain Loc --      Pain Education --      Exclude from Growth Chart --    No data found.  Updated Vital Signs BP 130/82 (BP Location: Right Arm)   Pulse 95   Temp 98.1 F (36.7 C) (Oral)   Resp 20   SpO2 93%   Visual Acuity Right Eye Distance:   Left Eye Distance:   Bilateral Distance:    Right Eye Near:   Left Eye Near:    Bilateral Near:     Physical Exam Vitals and nursing note reviewed.  Constitutional:      General: She is not in acute distress.    Appearance: She is well-developed. She is ill-appearing. She is not toxic-appearing.  HENT:     Head: Normocephalic and atraumatic.     Right Ear: Hearing, tympanic membrane, ear canal and external ear normal.     Left Ear: Hearing, tympanic membrane, ear canal and external ear normal.     Nose: Congestion and rhinorrhea present. Rhinorrhea is clear.     Right Sinus: No maxillary sinus tenderness or frontal sinus tenderness.     Left Sinus: No maxillary sinus tenderness or frontal sinus tenderness.     Mouth/Throat:     Lips: Pink.     Mouth: Mucous membranes are moist.     Pharynx: Uvula midline. No oropharyngeal exudate or posterior oropharyngeal erythema.     Tonsils: No tonsillar exudate.  Eyes:     Conjunctiva/sclera: Conjunctivae normal.     Pupils: Pupils are equal, round, and reactive to light.  Cardiovascular:     Rate and Rhythm: Normal rate and regular rhythm.     Heart sounds: S1 normal and S2 normal. No murmur heard. Pulmonary:     Effort: Pulmonary effort is normal. No respiratory distress.     Breath sounds: Examination of the right-upper field reveals wheezing. Examination of the left-upper field reveals wheezing. Examination of the right-middle field reveals wheezing. Examination of the left-middle field reveals wheezing. Examination of the  right-lower field reveals wheezing and rhonchi. Examination of the left-lower field reveals wheezing and rhonchi. Wheezing and rhonchi present. No decreased breath sounds or rales.  Abdominal:     Palpations: Abdomen is soft.     Tenderness: There is no abdominal tenderness.  Musculoskeletal:        General: No swelling.     Cervical back: Neck supple.  Lymphadenopathy:     Head:     Right side of head: No submental, submandibular, tonsillar, preauricular or posterior auricular adenopathy.     Left side of head: No submental, submandibular, tonsillar, preauricular or posterior auricular adenopathy.     Cervical: No cervical adenopathy.     Right cervical: No superficial cervical adenopathy.    Left cervical: No superficial cervical adenopathy.  Skin:    General: Skin is warm and dry.     Capillary Refill: Capillary refill takes less than 2 seconds.     Findings: No rash.  Neurological:     Mental Status: She is alert and oriented to person, place, and time.  Psychiatric:        Mood and Affect: Mood normal.      UC Treatments / Results  Labs (all labs ordered are listed, but only abnormal results are  displayed) Labs Reviewed - No data to display  EKG   Radiology No results found.  Procedures Procedures (including critical care time)  Medications Ordered in UC Medications  ipratropium-albuterol (DUONEB) 0.5-2.5 (3) MG/3ML nebulizer solution 3 mL (3 mLs Nebulization Given 04/04/23 1255)    Initial Impression / Assessment and Plan / UC Course  I have reviewed the triage vital signs and the nursing notes.  Pertinent labs & imaging results that were available during my care of the patient were reviewed by me and considered in my medical decision making (see chart for details).  Clinical Course as of 04/04/23 1348  Fri Apr 04, 2023  1317 DG Chest 2 View [SJ]    Clinical Course User Index [SJ] Prescilla Sours, FNP    Chest x-ray and possible early consolidation in the  bases.  I read this as early community-acquired pneumonia.  Radiology will review and I will contact the patient and his review differs.  She has many antibiotic allergies.  Will treat with Levaquin, 500 mg, take once daily after food for 7 days.  I refilled her albuterol inhaler and nebulizer solution.  She has Promethazine DM, may take 5 mL, every 6 hours as needed for cough.  Added prednisone, 20 mg, daily for 5 days.  She has a nebulizer machine at home and will use that every 4 hours if needed for wheezing.  Get plenty of fluids and rest.  Work excuse provided.  Follow-up if symptoms do not improve, worsen or new symptoms occur. Final Clinical Impressions(s) / UC Diagnoses   Final diagnoses:  Acute cough  Community acquired pneumonia, unspecified laterality     Discharge Instructions      Chest x-ray is hazy and possible early consolidation in the bases.  I read this is early community-acquired pneumonia.  Radiology will review and I will contact the patient if his opinion differs.  She has many antibiotic allergies.  Will treat with Levaquin, 500 mg, daily after food for community-acquired pneumonia.  Will take for 7 days.  Encouraged to probiotic daily for a month.  Prednisone, 20 mg, daily for 5 days.  Refill of her albuterol inhaler and albuterol nebulizer.  Take as noted below.  She has Promethazine DM and may use 5 mL every 6 hours as needed for cough.  Get plenty of fluids and rest.  Follow-up if symptoms do not improve, worsen or new symptoms occur.     ED Prescriptions     Medication Sig Dispense Auth. Provider   levofloxacin (LEVAQUIN) 500 MG tablet Take 1 tablet (500 mg total) by mouth daily for 7 days. Take after food.  Take a probiotic daily for a month due to antibiotic use. 7 tablet Prescilla Sours, FNP   predniSONE (DELTASONE) 20 MG tablet Take 1 tablet (20 mg total) by mouth daily with breakfast for 5 days. 5 tablet Prescilla Sours, FNP   albuterol (VENTOLIN HFA) 108 (90  Base) MCG/ACT inhaler Inhale 2 puffs into the lungs every 6 (six) hours as needed for wheezing or shortness of breath. 6.7 each Prescilla Sours, FNP   albuterol (PROVENTIL) (2.5 MG/3ML) 0.083% nebulizer solution Take 3 mLs (2.5 mg total) by nebulization every 4 (four) hours as needed for wheezing or shortness of breath. 150 mL Prescilla Sours, FNP      PDMP not reviewed this encounter.   Prescilla Sours, FNP 04/04/23 1348

## 2023-04-04 NOTE — Telephone Encounter (Signed)
 Copied from CRM [id number]. Topic: Clinical - Red Word Triage >> Apr 04, 2023  9:56 AM Nila Nephew wrote: Red Word that prompted transfer to Nurse Triage: Cough, congestion, cold chills, no fever, had flu last week and symptoms persisting, ribs and back are hurting from coughing so much, headache, thinks maybe bronchitis setting in  Chief Complaint: had flu and is not feeling better Symptoms: cough, pain in back and ribs, headache, congestion Frequency: constant Pertinent Negatives: Patient denies fever, heart and lung hx, cp Disposition: [] ED /[x] Urgent Care (no appt availability in office) / [] Appointment(In office/virtual)/ []  [facility]/ [] Home Care/ [] Refused Recommended Disposition /[] [facility]/ []  Follow-up with PCP Additional Notes: per protocol patient to be seen in 4 hours, instructed to go to UC d/t no apt available today.  Care advice given, denies questions, instructed to go to er if becomes worse.   Reason for Disposition  [1] MILD difficulty breathing (e.g., minimal/no SOB at rest, SOB with walking, pulse <100) AND [2] still present when not coughing  Answer Assessment - Initial Assessment Questions 1. ONSET: "When did the cough begin?"      Last week 2. SEVERITY: "How bad is the cough today?"      10/10 3. SPUTUM: "Describe the color of your sputum" (none, dry cough; clear, white, yellow, green)     Yes, green 4. HEMOPTYSIS: "Are you coughing up any blood?" If so ask: "How much?" (flecks, streaks, tablespoons, etc.)     Yes, brown  5. DIFFICULTY BREATHING: "Are you having difficulty breathing?" If Yes, ask: "How bad is it?" (e.g., mild, moderate, severe)    - MILD: No SOB at rest, mild SOB with walking, speaks normally in sentences, can lie down, no retractions, pulse < 100.    - MODERATE: SOB at rest, SOB with minimal exertion and prefers to sit, cannot lie down flat, speaks in phrases, mild retractions, audible wheezing, pulse 100-120.    - SEVERE:  Very SOB at rest, speaks in single words, struggling to breathe, sitting hunched forward, retractions, pulse > 120      After I cough 6. FEVER: "Do you have a fever?" If Yes, ask: "What is your temperature, how was it measured, and when did it start?"     denies 7. CARDIAC HISTORY: "Do you have any history of heart disease?" (e.g., heart attack, congestive heart failure)      denies 8. LUNG HISTORY: "Do you have any history of lung disease?"  (e.g., pulmonary embolus, asthma, emphysema)     denies 9. PE RISK FACTORS: "Do you have a history of blood clots?" (or: recent major surgery, recent prolonged travel, bedridden)     denies 10. OTHER SYMPTOMS: "Do you have any other symptoms?" (e.g., runny nose, wheezing, chest pain)       Runny nose, rib and back pain, cough, headache 11. PREGNANCY: "Is there any chance you are pregnant?" "When was your last menstrual period?"       na 12. TRAVEL: "Have you traveled out of the country in the last month?" (e.g., travel history, exposures)     no  Protocols used: Cough - Acute Productive-A-AH

## 2023-04-04 NOTE — Discharge Instructions (Addendum)
 Chest x-ray is hazy and possible early consolidation in the bases.  I read this is early community-acquired pneumonia.  Radiology will review and I will contact the patient if his opinion differs.  She has many antibiotic allergies.  Will treat with Levaquin, 500 mg, daily after food for community-acquired pneumonia.  Will take for 7 days.  Encouraged to probiotic daily for a month.  Prednisone, 20 mg, daily for 5 days.  Refill of her albuterol inhaler and albuterol nebulizer.  Take as noted below.  She has Promethazine DM and may use 5 mL every 6 hours as needed for cough.  Get plenty of fluids and rest.  Follow-up if symptoms do not improve, worsen or new symptoms occur.

## 2023-04-06 NOTE — Progress Notes (Unsigned)
 [name]

## 2023-04-07 ENCOUNTER — Encounter: Payer: BC Managed Care – PPO | Admitting: Pain Medicine

## 2023-04-07 ENCOUNTER — Encounter: Payer: Self-pay | Admitting: Medical-Surgical

## 2023-04-07 DIAGNOSIS — Z79891 Long term (current) use of opiate analgesic: Secondary | ICD-10-CM

## 2023-04-07 DIAGNOSIS — G8929 Other chronic pain: Secondary | ICD-10-CM

## 2023-04-07 DIAGNOSIS — M47816 Spondylosis without myelopathy or radiculopathy, lumbar region: Secondary | ICD-10-CM

## 2023-04-07 DIAGNOSIS — Z79899 Other long term (current) drug therapy: Secondary | ICD-10-CM

## 2023-04-07 DIAGNOSIS — G894 Chronic pain syndrome: Secondary | ICD-10-CM

## 2023-04-07 DIAGNOSIS — M5459 Other low back pain: Secondary | ICD-10-CM

## 2023-04-07 DIAGNOSIS — Z9189 Other specified personal risk factors, not elsewhere classified: Secondary | ICD-10-CM

## 2023-04-07 MED ORDER — FLUCONAZOLE 150 MG PO TABS: 150.0000 mg | ORAL_TABLET | Freq: Once | ORAL | 0 refills | Status: AC

## 2023-04-22 ENCOUNTER — Encounter: Payer: Self-pay | Admitting: Neurology

## 2023-04-22 ENCOUNTER — Telehealth: Payer: Self-pay | Admitting: Adult Health

## 2023-04-22 ENCOUNTER — Encounter: Payer: Self-pay | Admitting: Adult Health

## 2023-04-22 DIAGNOSIS — G43909 Migraine, unspecified, not intractable, without status migrainosus: Secondary | ICD-10-CM | POA: Diagnosis not present

## 2023-04-22 NOTE — Telephone Encounter (Signed)
 Pt will come get infusion today or migraine around 1:30 order provided for the pt to infusion suite.

## 2023-04-22 NOTE — Telephone Encounter (Signed)
 Responding via mychart message sent to [name], NP

## 2023-04-22 NOTE — Telephone Encounter (Signed)
 Pt also sent a mychart message. Responding to her through there.

## 2023-04-22 NOTE — Telephone Encounter (Signed)
 Pt called wanting to be advised on what she can do for her migraine that she has had for four days.

## 2023-04-24 ENCOUNTER — Ambulatory Visit: Payer: BC Managed Care – PPO | Admitting: Medical-Surgical

## 2023-04-25 ENCOUNTER — Encounter: Payer: Self-pay | Admitting: Medical-Surgical

## 2023-04-28 NOTE — Telephone Encounter (Signed)
 Last read by Feliciana Rossetti at 12:26PM on 04/28/2023.

## 2023-04-28 NOTE — Telephone Encounter (Signed)
 Authorization has been obtained, can you kindly add insurance to attach [place].Thank you

## 2023-04-30 ENCOUNTER — Encounter: Payer: Self-pay | Admitting: Medical-Surgical

## 2023-04-30 DIAGNOSIS — G4730 Sleep apnea, unspecified: Secondary | ICD-10-CM

## 2023-05-01 DIAGNOSIS — G4730 Sleep apnea, unspecified: Secondary | ICD-10-CM

## 2023-05-01 HISTORY — DX: Sleep apnea, unspecified: G47.30

## 2023-05-01 MED ORDER — ZEPBOUND 2.5 MG/0.5ML ~~LOC~~ SOAJ: 2.5000 mg | SUBCUTANEOUS | 0 refills | Status: DC

## 2023-05-02 ENCOUNTER — Encounter (HOSPITAL_BASED_OUTPATIENT_CLINIC_OR_DEPARTMENT_OTHER): Payer: Self-pay

## 2023-05-04 ENCOUNTER — Ambulatory Visit

## 2023-05-04 DIAGNOSIS — G1220 Motor neuron disease, unspecified: Secondary | ICD-10-CM | POA: Diagnosis not present

## 2023-05-04 DIAGNOSIS — I10 Essential (primary) hypertension: Secondary | ICD-10-CM

## 2023-05-04 DIAGNOSIS — R930 Abnormal findings on diagnostic imaging of skull and head, not elsewhere classified: Secondary | ICD-10-CM | POA: Diagnosis not present

## 2023-05-06 DIAGNOSIS — M1611 Unilateral primary osteoarthritis, right hip: Secondary | ICD-10-CM | POA: Diagnosis not present

## 2023-05-06 DIAGNOSIS — M25561 Pain in right knee: Secondary | ICD-10-CM | POA: Diagnosis not present

## 2023-05-07 ENCOUNTER — Encounter: Payer: Self-pay | Admitting: Medical-Surgical

## 2023-05-08 ENCOUNTER — Encounter: Payer: Self-pay | Admitting: Medical-Surgical

## 2023-05-09 ENCOUNTER — Other Ambulatory Visit (HOSPITAL_COMMUNITY): Payer: Self-pay

## 2023-05-09 ENCOUNTER — Telehealth: Payer: Self-pay

## 2023-05-09 NOTE — Telephone Encounter (Signed)
 Per test claim, weight loss medication is not covered by plan due to plan/benefit exclusion.

## 2023-05-09 NOTE — Telephone Encounter (Signed)
 Pharmacy Patient Advocate Encounter   Received notification from Patient Advice Request messages that prior authorization for Zepbound is required/requested.   Insurance verification completed.   The patient is insured through [facility] .   Per test claim: Product/Service not covered due to plan/benefit exclusion No PA submitted at this time.

## 2023-05-12 ENCOUNTER — Other Ambulatory Visit: Payer: Self-pay | Admitting: Medical-Surgical

## 2023-05-12 MED ORDER — PANTOPRAZOLE SODIUM 40 MG PO TBEC
40.0000 mg | DELAYED_RELEASE_TABLET | Freq: Every day | ORAL | 0 refills | Status: DC
Start: 2023-05-12 — End: 2023-09-05

## 2023-05-15 ENCOUNTER — Encounter: Payer: Self-pay | Admitting: Medical-Surgical

## 2023-05-16 NOTE — Telephone Encounter (Signed)
 Called radiology reading room - spoke with Ramon Dredge - exam changed to STAT read-

## 2023-05-20 ENCOUNTER — Encounter: Payer: Self-pay | Admitting: Medical-Surgical

## 2023-05-20 DIAGNOSIS — R9089 Other abnormal findings on diagnostic imaging of central nervous system: Secondary | ICD-10-CM

## 2023-05-21 ENCOUNTER — Telehealth: Payer: Self-pay | Admitting: Neurology

## 2023-05-21 NOTE — Telephone Encounter (Signed)
 We received a new referral for Mary Cruz from her PCP for Abnormal brain MRI-recently done on 4/4. When you can, could you review the information? Since she's established I wanted to check and see how you'd like to proceed. If she needs an appointment, how quickly does she need to be seen?  Thank you!

## 2023-05-22 DIAGNOSIS — M1611 Unilateral primary osteoarthritis, right hip: Secondary | ICD-10-CM | POA: Diagnosis not present

## 2023-05-22 NOTE — Telephone Encounter (Signed)
 Personally reviewed MRI of the brain from May 16, 2023, no significant abnormality, it is okay to keep previously scheduled visit with Mary Cruz in May

## 2023-05-22 NOTE — Telephone Encounter (Signed)
 Patient was seen by PCP and completed MRI Brain with multiple new complaints. Per PCP note, "presenting today with multiple complaints and concern of the possibility of MS. Reports a family history of MS in two remote relatives. Has been experiencing leg cramps, RLS, lower extremity weakness, loss of bladder control, blurry vision for 2 months, upper extremity paresthesias, poor balance, chronic pain, vertigo, facial sensory changes, insomnia, and intermittent difficulty speaking clearly at times. Feels that the symptoms have periods of remission and exacerbation. Plan for MR brain for further investigation."    If patient is wanting to be seen for the above complaints, will need a follow up with Dr. Terrace Arabia as these are all new symptoms as she is primarily followed for migraine headaches. MRI brain showed questionable T2 and FLAIR signal associated with corticospinal tracts. Does this need to be further evaluated as well?

## 2023-05-26 NOTE — Telephone Encounter (Signed)
 Please have visit in May cancelled with me and transition to follow-up visit with Dr. Gracie Lav. Thank you.

## 2023-05-26 NOTE — Telephone Encounter (Signed)
 Call to patient and asked for return call. Left message stating Dr. Gracie Lav will see her and to call and get scheduled for next available. Also advised may appt with Camilo Cella was cancelled.

## 2023-06-05 ENCOUNTER — Encounter: Payer: Self-pay | Admitting: Neurology

## 2023-06-05 ENCOUNTER — Ambulatory Visit: Admitting: Neurology

## 2023-06-05 ENCOUNTER — Encounter: Payer: Self-pay | Admitting: Medical-Surgical

## 2023-06-05 VITALS — BP 146/90 | HR 96 | Ht 63.5 in | Wt 276.0 lb

## 2023-06-05 DIAGNOSIS — G43909 Migraine, unspecified, not intractable, without status migrainosus: Secondary | ICD-10-CM | POA: Diagnosis not present

## 2023-06-05 DIAGNOSIS — R5383 Other fatigue: Secondary | ICD-10-CM | POA: Insufficient documentation

## 2023-06-05 DIAGNOSIS — R9089 Other abnormal findings on diagnostic imaging of central nervous system: Secondary | ICD-10-CM

## 2023-06-05 HISTORY — DX: Other fatigue: R53.83

## 2023-06-05 NOTE — Progress Notes (Signed)
 Chief Complaint  Patient presents with   New Problem    Pt in 14, here alone  Pt is here abnormal MRI and also discuss MS. Pt states she has been experiencing leg cramps, RLS, lower extremity weakness, loss of bladder control, blurry vision for 2 months, upper extremity paresthesias, poor balance, chronic pain, facial sensory changes, insomnia, and intermittent difficulty speaking clearly at times.      ASSESSMENT AND PLAN  Mary Cruz is a 48 y.o. female    Intermittent bilateral hands and feet paresthesia  EMG nerve conduction study confirmed mild bilateral carpal tunnel syndromes, no evidence of large fiber peripheral neuropathy or bilateral lumbosacral radiculopathy,  Chronic migraine headaches  Amerge as needed works well Obesity, at risk for obstructive sleep apnea  Sleep study in June 2024 diagnosed with severe central sleep apnea, that is related to narcotic/opiates use, improved after stopping those medications  Referred to weight loss program  Constellation of complaints,  Essentially normal neurologic examination, MRI of the brain, no evidence of motor neuron disease   Laboratory evaluation including vitamin D  level, inflammatory markers Continue follow-up with primary care, only return to clinic for new issues  DIAGNOSTIC DATA (LABS, IMAGING, TESTING) - I reviewed patient records, labs, notes, testing and imaging myself where available.   MEDICAL HISTORY:  Mary Cruz is a 48 year old female, seen in request by her primary care [facility] nurse practitioner Cherre Cornish, for evaluation of bilateral hands, feet paresthesia, initial evaluation was on February 05, 2022  I reviewed and summarized the referring note. PMHx Depression, anxiety HTN Kidney stone. Chronic low back pain, under pain management, s/p spinal cord stimulator placement in May 2018, oxycodone  5mg  x4 tabs a day prn. Chronic migraine Quit smoke since Nov 2023.  She had long  history of headaches, getting worse over the past few years, despite taking Topamax  50 mg every night, she has migraine once or twice each week, lateralized severe pounding headache with light noise sensitivity, Maxalt  10 mg as needed was helpful, but she has to take second dose 50% of the time, sleep always helps  She complains of few months history of numbness tingling, involving all 5 fingers on both hands, to the point she dropped things from her hands, sometimes woke her up from sleep, have to shake her hands to make the sensation come back  Over the past few months, she also experiences constant numbness tingling at the bottom of her feet  She has mild gait abnormality due to previous history of right ankle fracture, required surgery, continue to have discomfort  She denies bowel bladder incontinence  UPDATE May 15 2022: Continue complains of bilateral hand and lower extremity paresthesia, denies gait abnormality, loud snoring, frequent awakening, poor sleep quality, tolerating nortriptyline  20 mg every night, seems to sleep better, help her headache some.  But she continues to have migraine headache 2-3 times each week, lateralized severe pounding headache with light noise sensitivity, lasting few hours to couple days, Maxalt  only work part of the time, take longer to kick in  Previous Imitrex  injection was helpful  EMG nerve conduction study today showed no evidence of large fiber peripheral neuropathy, no lumbar sacral radiculopathy, mild bilateral carpal tunnel syndromes  UPDATE June 05 2023: She has depression anxiety, has constellation of symptoms, body achy pain, muscle spasm, difficulty sleepy, drop things from hands,   On polypharmacy treatment including Cymbalta  60 mg twice a day, BuSpar  5 mg twice a day, nortriptyline  30  mg every night, her headache is under good control, Amerge as needed was helpful  Personally reviewed MRI of the brain without contrast in March 2025,  motion artifact otherwise no significant abnormalities  PHYSICAL EXAM:   Vitals:   06/05/23 1401 06/05/23 1412  BP: (!) 173/87 (!) 146/90  Pulse: (!) 107 96  Weight: 276 lb (125.2 kg)   Height: 5' 3.5" (1.613 m)    Body mass index is 48.12 kg/m.  PHYSICAL EXAMNIATION:  Gen: NAD, conversant, well nourised, well groomed                     Cardiovascular: Regular rate rhythm, no peripheral edema, warm, nontender. Eyes: Conjunctivae clear without exudates or hemorrhage Neck: Supple, no carotid bruits. Pulmonary: Clear to auscultation bilaterally   NEUROLOGICAL EXAM:  MENTAL STATUS: Speech/cognition: Obese, awake, alert, oriented to history taking and casual conversation, obese CRANIAL NERVES: CN II: Visual fields are full to confrontation. Pupils are round equal and briskly reactive to light. CN III, IV, VI: extraocular movement are normal. No ptosis. CN V: Facial sensation is intact to light touch CN VII: Face is symmetric with normal eye closure  CN VIII: Hearing is normal to causal conversation. CN IX, X: Phonation is normal. CN XI: Head turning and shoulder shrug are intact CN XII: Narrow oropharyngeal space  MOTOR: There is no pronator drift of out-stretched arms. Muscle bulk and tone are normal. Muscle strength is normal.  REFLEXES: Reflexes are 1 and symmetric at the biceps, triceps, knees, and ankles. Plantar responses are flexor.  SENSORY: Intact to light touch, pinprick and vibratory sensation are intact in fingers and toes.  With exception of decreased pinprick at first 3 finger pads  COORDINATION: There is no trunk or limb dysmetria noted.  GAIT/STANCE: Need push-up to get up from seated position, steady,    REVIEW OF SYSTEMS:  Full 14 system review of systems performed and notable only for as above All other review of systems were negative.   ALLERGIES: Allergies  Allergen Reactions   Celecoxib Swelling    "Body swelling"   Doxycycline  Other (See  Comments) and Nausea And Vomiting    "SICKNESS"   Gabapentin Swelling    Whole body swelling   Latex Itching and Swelling   Nabumetone Itching   Pentazocine Lactate Itching and Other (See Comments)    HALLUCINATIONS   Seroquel [Quetiapine] Other (See Comments)    Falls   Amoxicillin  Diarrhea and Nausea And Vomiting   Bactrim [Sulfamethoxazole-Trimethoprim] Other (See Comments)    "Makes very sick"   Cephalexin  Other (See Comments) and Hives    Nausea and causes uti's   Erythromycin Other (See Comments)    RESULTANT UTI   Hydromorphone  Hcl Other (See Comments)    HALLUCINATIONS   Lyrica  [Pregabalin ] Other (See Comments)    "Too sleepy"    HOME MEDICATIONS: Current Outpatient Medications  Medication Sig Dispense Refill   busPIRone  (BUSPAR ) 5 MG tablet Take 1-3 tablets (5-15 mg total) by mouth 2 (two) times daily as needed. 180 tablet 1   clotrimazole -betamethasone  (LOTRISONE ) cream Apply 1 Application topically daily. Use up to 14 days 45 g 1   DULoxetine  (CYMBALTA ) 60 MG capsule Take 1 capsule (60 mg total) by mouth 2 (two) times daily. 180 capsule 1   famotidine  (PEPCID ) 20 MG tablet Take 1 tablet (20 mg total) by mouth 2 (two) times daily. 180 tablet 1   Fezolinetant  (VEOZAH ) 45 MG TABS Take 1 tablet (45 mg total)  by mouth daily. 90 tablet 1   hyoscyamine  (LEVSIN SL) 0.125 MG SL tablet Place 1 tablet (0.125 mg total) under the tongue daily as needed. 90 tablet 3   levocetirizine (XYZAL ) 5 MG tablet Take 1 tablet (5 mg total) by mouth every evening. 90 tablet 3   lisinopril -hydrochlorothiazide  (ZESTORETIC ) 10-12.5 MG tablet Take 1 tablet by mouth daily. 90 tablet 1   meclizine  (ANTIVERT ) 25 MG tablet Take 1 tablet (25 mg total) by mouth 3 (three) times daily as needed for dizziness. 90 tablet 1   methocarbamol  (ROBAXIN ) 750 MG tablet Take 1 tablet (750 mg total) by mouth every 8 (eight) hours as needed for muscle spasms. 270 tablet 3   naloxone  (NARCAN ) nasal spray 4 mg/0.1 mL  Place 1 spray into the nose as needed for up to 365 doses (for opioid-induced respiratory depresssion). In case of emergency (overdose), spray once into each nostril. If no response within 3 minutes, repeat application and call 911. 1 each 0   naratriptan  (AMERGE) 2.5 MG tablet Take 1 tablet (2.5 mg total) by mouth as needed for migraine. Take one (1) tablet at onset of headache; if returns or does not resolve, may repeat after 4 hours; do not exceed five (5) mg in 24 hours. 10 tablet 11   nortriptyline  (PAMELOR ) 10 MG capsule Take 3 capsules (30 mg total) by mouth at bedtime. 90 capsule 5   ondansetron  (ZOFRAN ) 4 MG tablet Take 1 tablet (4 mg total) by mouth every 8 (eight) hours as needed for nausea or vomiting. 20 tablet 11   pantoprazole  (PROTONIX ) 40 MG tablet Take 1 tablet (40 mg total) by mouth daily. 90 tablet 0   Probiotic Product (ALIGN PO) Take 1 tablet by mouth daily.     promethazine -dextromethorphan (PROMETHAZINE -DM) 6.25-15 MG/5ML syrup Take 5 mLs by mouth 4 (four) times daily as needed for cough. 118 mL 0   Respiratory Therapy Supplies (FULL KIT NEBULIZER SET) MISC Nebulizer machine of patient's choice, please include tubes and hoses as needed.  Use as directed. 1 each 0   rOPINIRole  (REQUIP ) 0.25 MG tablet Take 1 tablet (0.25 mg total) by mouth at bedtime. 90 tablet 0   albuterol  (PROVENTIL ) (2.5 MG/3ML) 0.083% nebulizer solution Take 3 mLs (2.5 mg total) by nebulization every 4 (four) hours as needed for wheezing or shortness of breath. (Patient not taking: Reported on 06/05/2023) 150 mL 0   albuterol  (VENTOLIN  HFA) 108 (90 Base) MCG/ACT inhaler Inhale 2 puffs into the lungs every 6 (six) hours as needed for wheezing or shortness of breath. (Patient not taking: Reported on 06/05/2023) 6.7 each 0   budesonide  (PULMICORT ) 0.25 MG/2ML nebulizer solution USE 2 ML(0.25 MG) VIA NEBULIZER IN THE MORNING AND AT BEDTIME (Patient not taking: Reported on 06/05/2023) 60 mL 0   buprenorphine  (BUTRANS )  10 MCG/HR PTWK Place 1 patch onto the skin once a week for 28 days. 4 patch 0   metoprolol  tartrate (LOPRESSOR ) 25 MG tablet Take 0.5 tablets (12.5 mg total) by mouth 2 (two) times daily. 90 tablet 3   No current facility-administered medications for this visit.    PAST MEDICAL HISTORY: Past Medical History:  Diagnosis Date   Abnormal MRI, lumbar spine (11/04/2021) 11/07/2021   (11/04/2021) LUMBAR MRI FINDINGS:  DISC LEVELS:  L4-L5: Mild facet arthropathy. Left far lateral/extraforaminal disc protrusion with mild resulting left foraminal stenosis.  L5-S1: Small left foraminal disc protrusion. Bilateral facet arthropathy.     IMPRESSION:  Lower lumbar degenerative change without impingement.  Abnormal MRI, shoulder (Right) 11/15/2018   FINDINGS:  Rotator cuff: Mild to moderate supraspinatus tendinopathy potentially with mild partial-thickness bursal surface tearing of the supraspinatus. Mild infraspinatus tendinopathy.  Muscles:  Unremarkable  Biceps long head:  Unremarkable  Acromioclavicular Joint: Mild degenerative spurring with a small amount of fluid signal in the [facility] joint. Type I acromion. 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 08/19/2022   Cervicalgia 11/15/2018   Chronic cervical radicular pain (C7 Dermatome) (Bilateral) (L>R) 12/19/2014   Chronic feet pain (Bilateral) 10/30/2020   Chronic hip pain (3ry area of Pain) (Bilateral) (R>L) 08/20/2018   Chronic hip pain (Right) 06/13/2021   Chronic low back pain (Bilateral) (R>L) w/ sciatica (Bilateral) (R>L) 06/21/2019   Chronic low back pain (Bilateral) (R>L) w/o sciatica 12/19/2014   Chronic lower extremity pain (Bilateral) (R>L) 12/19/2014   Chronic lower extremity pain (Right) 06/21/2019   Chronic lumbar radicular pain (Right  L5 dermatome; Left S1 Dermatome) (Bilateral) (R>L) 12/19/2014   Right L5 radicular pain. Left S1 radicular pain.     Chronic migraine w/o aura w/o status migrainosus, not intractable 05/15/2022   Chronic musculoskeletal pain 12/19/2014   Chronic neck pain (1ry area of Pain) (Bilateral) (L>R) 12/19/2014   Chronic pain syndrome 07/09/2013   Chronic upper extremity pain (2ry area of Pain) (Bilateral) (L>R) 08/20/2018   Chronic use of opiate for therapeutic purpose 06/27/2020   Complaints of weakness of lower extremities (Bilateral) 11/07/2021   DDD (degenerative disc disease), cervical 11/15/2018   DDD (degenerative disc disease), lumbar 02/25/2019   Disorder of skeletal system 08/20/2018   Dropfoot (Right) 06/21/2019   Essential hypertension, benign 07/09/2013   Fibromyalgia    Gastroesophageal reflux disease 07/02/2007   Qualifier: Diagnosis of   By: Harles Lied CMA (AAMA), Patty         Generalized anxiety disorder 03/16/2014   Grade 1 Retrolisthesis of L4/L5 07/31/2021   Hypertension    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 pain 06/13/2022   Interstitial cystitis 05/04/2020   Intractable low back pain 04/25/2020   Irritable bowel syndrome with both constipation and diarrhea 07/02/2007   Qualifier: Diagnosis of   By: Harles Lied CMA (AAMA), Patty         Latex precautions, history of latex allergy 04/08/2019   Long term prescription opiate use 12/19/2014   Loss of consciousness (HCC) 06/13/2022   Lower extremity numbness (Bilateral) 11/07/2021   Lumbar facet arthropathy 06/21/2019   Lumbar facet syndrome (Bilateral) (R>L) 06/29/2015   Lumbar spondylosis 06/29/2015   Lumbosacral radiculopathy at L5 (Right) 06/21/2019   Lumbosacral radiculopathy/radiculitis at L4 (Right) 07/31/2021   Metabolic syndrome 08/04/2020   Migraines 11/12/2019   Mixed hyperlipidemia 08/04/2020   Morbid obesity (HCC) 12/19/2014   Neurogenic pain 10/10/2016   Nocturia more than twice per night 06/13/2022   Osteoarthritis     Osteoarthritis involving multiple joints 04/08/2019   Osteoarthritis of hip (Right) 07/16/2021   Osteoarthrosis 12/19/2014   Pain    chronic regional pain syndrome   Pain in right knee 03/19/2017   Paresthesia 02/05/2022   Perimenopausal vasomotor symptoms 05/22/2022   Pharmacologic therapy 08/20/2018   Positive ANA (antinuclear antibody) 09/13/2015   Following with Vinita Greenspan, PA at [facility] rheumatology. Recently seen for second opinion 10/03/2016 for evaluation of connective tissue disease and/or lupus. Prior serology show positive  ANA without positive lupus specific antibodies.Aaron Aas Response to Plaquenil and as needed prednisone  in the past. Intends do not meet clinical criteria for lupus. If specific ANA panel negative, no further workup   Prediabetes 10/19/2020   Presence of functional implant ([employer]tronic Lumbar spinal cord stimulator implant) 12/19/2014   Non-MRI compatible system. 08/2007.     Refractory chronic cough 06/13/2022   Restless leg syndrome 10/06/2022   Spinal cord stimulator status 05/16/2016   Spondylolisthesis at L4-L5 level 07/31/2021   Spondylosis without myelopathy or radiculopathy, lumbosacral region 02/25/2019   Uncomplicated opioid dependence (HCC) 12/19/2014   Vitamin D  insufficiency 12/19/2014    PAST SURGICAL HISTORY: Past Surgical History:  Procedure Laterality Date   ANKLE SURGERY     APPENDECTOMY     BREAST SURGERY     CHOLECYSTECTOMY  2000   COLONOSCOPY N/A 02/20/2021   Procedure: COLONOSCOPY;  Surgeon: Shane Darling, MD;  Location: [facility] ENDOSCOPY;  Service: Endoscopy;  Laterality: N/A;   COLONOSCOPY WITH ESOPHAGOGASTRODUODENOSCOPY (EGD)     ENDOMETRIAL ABLATION     ESOPHAGOGASTRODUODENOSCOPY N/A 02/20/2021   Procedure: ESOPHAGOGASTRODUODENOSCOPY (EGD);  Surgeon: Shane Darling, MD;  Location: [facility] ENDOSCOPY;  Service: Endoscopy;  Laterality: N/A;   KNEE SURGERY     KNEE SURGERY Right 02/2015   LAPAROSCOPIC APPENDECTOMY Right  01/01/2020   Procedure: APPENDECTOMY LAPAROSCOPIC;  Surgeon: Enid Harry, MD;  Location: [facility] ORS;  Service: General;  Laterality: Right;   OCCIPITAL NERVE STIMULATOR INSERTION     SHOULDER ARTHROSCOPY WITH DISTAL CLAVICLE RESECTION Right 10/29/2018   Procedure: SHOULDER ARTHROSCOPY WITH DISTAL CLAVICLE RESECTION;  Surgeon: Micheline Ahr, MD;  Location: [facility];  Service: Orthopedics;  Laterality: Right;   SHOULDER SURGERY     spinal cord stimulator     MRI info: [email or web address] 1.5T Full body eligible   SPINAL CORD STIMULATOR IMPLANT  2008   SPINAL CORD STIMULATOR INSERTION N/A 06/21/2016   Procedure: LUMBAR SPINAL CORD STIMULATOR INSERTION;  Surgeon: Gerri Kras, MD;  Location: [facility] OR;  Service: Neurosurgery;  Laterality: N/A;  LUMBAR SPINAL CORD STIMULATOR INSERTION   SUBACROMIAL DECOMPRESSION Right 10/29/2018   Procedure: SUBACROMIAL DECOMPRESSION;  Surgeon: Micheline Ahr, MD;  Location: [facility];  Service: Orthopedics;  Laterality: Right;   TUBAL LIGATION  2000    FAMILY HISTORY: Family History  Problem Relation Age of Onset   Depression Mother        maternal grandmother   Asthma Mother    Fibromyalgia Other        aunt   Stroke Other        grandmother   Thyroid disease Other        grandmother    SOCIAL HISTORY: Social History   Socioeconomic History   Marital status: Married    Spouse name: Not on file   Number of children: Not on file   Years of education: Not on file   Highest education level: Some college, no degree  Occupational History   Not on file  Tobacco Use   Smoking status: Former    Current packs/day: 0.00    Types: Cigarettes    Quit date: 05/06/2020    Years since quitting: 3.0   Smokeless tobacco: Former    Quit date: 12/22/2017  Vaping Use   Vaping status: Every Day   Substances: Nicotine, Flavoring  Substance and Sexual Activity   Alcohol  use: No    Alcohol/week: 0.0 standard drinks of alcohol   Drug use: No  Sexual activity: Yes    Partners: Male    Birth control/protection: Surgical  Other Topics Concern   Not on file  Social History Narrative   Not on file   Social Drivers of Health   Financial Resource Strain: Medium Risk (02/03/2023)   Overall Financial Resource Strain (CARDIA)    Difficulty of Paying Living Expenses: Somewhat hard  Food Insecurity: Food Insecurity Present (02/03/2023)   Hunger Vital Sign    Worried About Running Out of Food in the Last Year: Sometimes true    Ran Out of Food in the Last Year: Sometimes true  Transportation Needs: No Transportation Needs (02/03/2023)   PRAPARE - Administrator, Civil Service (Medical): No    Lack of Transportation (Non-Medical): No  Physical Activity: Sufficiently Active (02/03/2023)   Exercise Vital Sign    Days of Exercise per Week: 5 days    Minutes of Exercise per Session: 30 min  Recent Concern: Physical Activity - Insufficiently Active (12/10/2022)   Exercise Vital Sign    Days of Exercise per Week: 2 days    Minutes of Exercise per Session: 30 min  Stress: No Stress Concern Present (02/03/2023)   [employer] of Occupational Health - Occupational Stress Questionnaire    Feeling of Stress : Only a little  Social Connections: Moderately Integrated (02/03/2023)   Social Connection and Isolation Panel [NHANES]    Frequency of Communication with Friends and Family: Twice a week    Frequency of Social Gatherings with Friends and Family: Once a week    Attends Religious Services: 1 to 4 times per year    Active Member of [employer] or Organizations: No    Attends Engineer, structural: Not on file    Marital Status: Married  Catering manager Violence: Not on file      Phebe Brasil, M.D. Ph.D.  [facility] [address] [place], [place] [address] Ph: [phone] Fax: [phone]  CC:  Cherre Cornish, NP [address] [place],  [place] [address]  Cherre Cornish, NP

## 2023-06-06 ENCOUNTER — Other Ambulatory Visit: Payer: Self-pay | Admitting: Medical-Surgical

## 2023-06-06 DIAGNOSIS — G2581 Restless legs syndrome: Secondary | ICD-10-CM

## 2023-06-06 LAB — COMPREHENSIVE METABOLIC PANEL WITH GFR
ALT: 28 IU/L (ref 0–32)
AST: 23 IU/L (ref 0–40)
Albumin: 4.2 g/dL (ref 3.9–4.9)
Alkaline Phosphatase: 132 IU/L — ABNORMAL HIGH (ref 44–121)
BUN/Creatinine Ratio: 10 (ref 9–23)
BUN: 9 mg/dL (ref 6–24)
Bilirubin Total: <0.2 mg/dL (ref 0.0–1.2)
CO2: 22 mmol/L (ref 20–29)
Calcium: 9.6 mg/dL (ref 8.7–10.2)
Chloride: 101 mmol/L (ref 96–106)
Creatinine, Ser: 0.92 mg/dL (ref 0.57–1.00)
Globulin, Total: 3 g/dL (ref 1.5–4.5)
Glucose: 126 mg/dL — ABNORMAL HIGH (ref 70–99)
Potassium: 4 mmol/L (ref 3.5–5.2)
Sodium: 138 mmol/L (ref 134–144)
Total Protein: 7.2 g/dL (ref 6.0–8.5)
eGFR: 77 mL/min/{1.73_m2} (ref >59)

## 2023-06-06 LAB — CBC WITH DIFFERENTIAL/PLATELET
Basophils Absolute: 0 10*3/uL (ref 0.0–0.2)
Basos: 1 %
EOS (ABSOLUTE): 0.1 10*3/uL (ref 0.0–0.4)
Eos: 2 %
Hematocrit: 41.3 % (ref 34.0–46.6)
Hemoglobin: 13.7 g/dL (ref 11.1–15.9)
Immature Grans (Abs): 0 10*3/uL (ref 0.0–0.1)
Immature Granulocytes: 0 %
Lymphocytes Absolute: 2 10*3/uL (ref 0.7–3.1)
Lymphs: 34 %
MCH: 28.1 pg (ref 26.6–33.0)
MCHC: 33.2 g/dL (ref 31.5–35.7)
MCV: 85 fL (ref 79–97)
Monocytes Absolute: 0.6 10*3/uL (ref 0.1–0.9)
Monocytes: 9 %
Neutrophils Absolute: 3.3 10*3/uL (ref 1.4–7.0)
Neutrophils: 54 %
Platelets: 273 10*3/uL (ref 150–450)
RBC: 4.87 x10E6/uL (ref 3.77–5.28)
RDW: 14.3 % (ref 11.7–15.4)
WBC: 6 10*3/uL (ref 3.4–10.8)

## 2023-06-06 LAB — TSH: TSH: 1.46 u[IU]/mL (ref 0.450–4.500)

## 2023-06-06 LAB — SEDIMENTATION RATE: Sed Rate: 54 mm/h — ABNORMAL HIGH (ref 0–32)

## 2023-06-06 LAB — VITAMIN B12: Vitamin B-12: 471 pg/mL (ref 232–1245)

## 2023-06-06 LAB — ANA W/REFLEX IF POSITIVE: Anti Nuclear Antibody (ANA): NEGATIVE

## 2023-06-06 LAB — CK: Total CK: 65 U/L (ref 32–182)

## 2023-06-06 LAB — C-REACTIVE PROTEIN: CRP: 6 mg/L (ref 0–10)

## 2023-06-06 LAB — VITAMIN D 25 HYDROXY (VIT D DEFICIENCY, FRACTURES): Vit D, 25-Hydroxy: 15.7 ng/mL — ABNORMAL LOW (ref 30.0–100.0)

## 2023-06-06 MED ORDER — ROPINIROLE HCL 0.25 MG PO TABS
0.2500 mg | ORAL_TABLET | Freq: Every day | ORAL | 0 refills | Status: DC
Start: 2023-06-06 — End: 2023-09-05

## 2023-06-10 ENCOUNTER — Encounter: Payer: Self-pay | Admitting: Neurology

## 2023-06-11 ENCOUNTER — Other Ambulatory Visit: Payer: Self-pay

## 2023-06-11 DIAGNOSIS — M797 Fibromyalgia: Secondary | ICD-10-CM

## 2023-06-11 DIAGNOSIS — M7918 Myalgia, other site: Secondary | ICD-10-CM

## 2023-06-11 DIAGNOSIS — M25551 Pain in right hip: Secondary | ICD-10-CM | POA: Diagnosis not present

## 2023-06-11 MED ORDER — METHOCARBAMOL 750 MG PO TABS: 750.0000 mg | ORAL_TABLET | Freq: Three times a day (TID) | ORAL | 0 refills | Status: DC | PRN

## 2023-06-12 ENCOUNTER — Telehealth: Payer: BC Managed Care – PPO | Admitting: Adult Health

## 2023-06-17 ENCOUNTER — Encounter: Payer: Self-pay | Admitting: Medical-Surgical

## 2023-06-27 ENCOUNTER — Encounter: Payer: Self-pay | Admitting: Medical-Surgical

## 2023-07-01 ENCOUNTER — Encounter: Payer: Self-pay | Admitting: Medical-Surgical

## 2023-07-01 DIAGNOSIS — M25551 Pain in right hip: Secondary | ICD-10-CM | POA: Diagnosis not present

## 2023-07-02 MED ORDER — METHOCARBAMOL 1000 MG PO TABS
1000.0000 mg | ORAL_TABLET | Freq: Four times a day (QID) | ORAL | 1 refills | Status: DC
Start: 2023-07-02 — End: 2023-08-19

## 2023-07-18 ENCOUNTER — Encounter: Payer: Self-pay | Admitting: Medical-Surgical

## 2023-07-18 DIAGNOSIS — M25551 Pain in right hip: Secondary | ICD-10-CM | POA: Diagnosis not present

## 2023-07-18 DIAGNOSIS — G8929 Other chronic pain: Secondary | ICD-10-CM | POA: Diagnosis not present

## 2023-07-18 DIAGNOSIS — Z6841 Body Mass Index (BMI) 40.0 and over, adult: Secondary | ICD-10-CM

## 2023-07-22 MED ORDER — NALTREXONE-BUPROPION HCL ER 8-90 MG PO TB12: ORAL_TABLET | ORAL | 0 refills | Status: AC

## 2023-07-22 MED ORDER — NALTREXONE-BUPROPION HCL ER 8-90 MG PO TB12: ORAL_TABLET | ORAL | 0 refills | Status: DC

## 2023-07-24 ENCOUNTER — Other Ambulatory Visit: Payer: Self-pay | Admitting: Medical-Surgical

## 2023-07-24 DIAGNOSIS — I10 Essential (primary) hypertension: Secondary | ICD-10-CM

## 2023-07-28 ENCOUNTER — Other Ambulatory Visit: Payer: Self-pay

## 2023-07-28 MED ORDER — FAMOTIDINE 20 MG PO TABS: 20.0000 mg | ORAL_TABLET | Freq: Two times a day (BID) | ORAL | 1 refills | Status: DC

## 2023-07-28 MED ORDER — LEVOCETIRIZINE DIHYDROCHLORIDE 5 MG PO TABS: 5.0000 mg | ORAL_TABLET | Freq: Every evening | ORAL | 1 refills | Status: DC

## 2023-08-18 ENCOUNTER — Other Ambulatory Visit: Payer: Self-pay | Admitting: Medical-Surgical

## 2023-08-18 ENCOUNTER — Encounter: Payer: Self-pay | Admitting: Medical-Surgical

## 2023-08-18 DIAGNOSIS — I10 Essential (primary) hypertension: Secondary | ICD-10-CM

## 2023-08-18 DIAGNOSIS — G2581 Restless legs syndrome: Secondary | ICD-10-CM

## 2023-08-19 MED ORDER — METHOCARBAMOL 1000 MG PO TABS: 1000.0000 mg | ORAL_TABLET | Freq: Two times a day (BID) | ORAL | 0 refills | Status: DC | PRN

## 2023-08-19 MED ORDER — LISINOPRIL-HYDROCHLOROTHIAZIDE 10-12.5 MG PO TABS: 1.0000 | ORAL_TABLET | Freq: Every day | ORAL | 0 refills | Status: DC

## 2023-08-29 ENCOUNTER — Other Ambulatory Visit: Payer: Self-pay | Admitting: Medical Genetics

## 2023-09-05 ENCOUNTER — Other Ambulatory Visit (HOSPITAL_COMMUNITY)

## 2023-09-05 ENCOUNTER — Ambulatory Visit (INDEPENDENT_AMBULATORY_CARE_PROVIDER_SITE_OTHER): Admitting: Medical-Surgical

## 2023-09-05 VITALS — BP 122/80 | HR 99 | Resp 20 | Ht 63.5 in | Wt 277.2 lb

## 2023-09-05 DIAGNOSIS — G894 Chronic pain syndrome: Secondary | ICD-10-CM

## 2023-09-05 DIAGNOSIS — E559 Vitamin D deficiency, unspecified: Secondary | ICD-10-CM

## 2023-09-05 DIAGNOSIS — N951 Menopausal and female climacteric states: Secondary | ICD-10-CM

## 2023-09-05 DIAGNOSIS — F419 Anxiety disorder, unspecified: Secondary | ICD-10-CM

## 2023-09-05 DIAGNOSIS — F32A Depression, unspecified: Secondary | ICD-10-CM | POA: Diagnosis not present

## 2023-09-05 DIAGNOSIS — F411 Generalized anxiety disorder: Secondary | ICD-10-CM | POA: Diagnosis not present

## 2023-09-05 DIAGNOSIS — Z0000 Encounter for general adult medical examination without abnormal findings: Secondary | ICD-10-CM | POA: Diagnosis not present

## 2023-09-05 DIAGNOSIS — R7303 Prediabetes: Secondary | ICD-10-CM

## 2023-09-05 DIAGNOSIS — K219 Gastro-esophageal reflux disease without esophagitis: Secondary | ICD-10-CM

## 2023-09-05 DIAGNOSIS — I10 Essential (primary) hypertension: Secondary | ICD-10-CM

## 2023-09-05 DIAGNOSIS — G2581 Restless legs syndrome: Secondary | ICD-10-CM

## 2023-09-05 DIAGNOSIS — E782 Mixed hyperlipidemia: Secondary | ICD-10-CM

## 2023-09-05 MED ORDER — BUSPIRONE HCL 5 MG PO TABS: 5.0000 mg | ORAL_TABLET | Freq: Two times a day (BID) | ORAL | 3 refills | Status: AC | PRN

## 2023-09-05 MED ORDER — METHOCARBAMOL 1000 MG PO TABS: 1000.0000 mg | ORAL_TABLET | Freq: Two times a day (BID) | ORAL | 3 refills | Status: DC | PRN

## 2023-09-05 MED ORDER — PANTOPRAZOLE SODIUM 40 MG PO TBEC
40.0000 mg | DELAYED_RELEASE_TABLET | Freq: Every day | ORAL | 3 refills | Status: AC
Start: 2023-09-05 — End: ?

## 2023-09-05 MED ORDER — DULOXETINE HCL 60 MG PO CPEP: 60.0000 mg | ORAL_CAPSULE | Freq: Two times a day (BID) | ORAL | 3 refills | Status: DC

## 2023-09-05 MED ORDER — LEVOCETIRIZINE DIHYDROCHLORIDE 5 MG PO TABS: 5.0000 mg | ORAL_TABLET | Freq: Every evening | ORAL | 3 refills | Status: AC

## 2023-09-05 MED ORDER — ROPINIROLE HCL 0.25 MG PO TABS: 0.2500 mg | ORAL_TABLET | Freq: Every day | ORAL | 3 refills | Status: AC

## 2023-09-05 MED ORDER — LISINOPRIL-HYDROCHLOROTHIAZIDE 10-12.5 MG PO TABS: 1.0000 | ORAL_TABLET | Freq: Every day | ORAL | 3 refills | Status: DC

## 2023-09-05 NOTE — Patient Instructions (Signed)
 Preventive Care 16-48 Years Old, Female  Preventive care refers to lifestyle choices and visits with your health care provider that can promote health and wellness. Preventive care visits are also called wellness exams.  What can I expect for my preventive care visit?  Counseling  Your health care provider may ask you questions about your:  Medical history, including:  Past medical problems.  Family medical history.  Pregnancy history.  Current health, including:  Menstrual cycle.  Method of birth control.  Emotional well-being.  Home life and relationship well-being.  Sexual activity and sexual health.  Lifestyle, including:  Alcohol, nicotine or tobacco, and drug use.  Access to firearms.  Diet, exercise, and sleep habits.  Work and work Astron[employer].  Sunscreen use.  Safety issues such as seatbelt and bike helmet use.  Physical exam  Your health care provider will check your:  Height and weight. These may be used to calculate your BMI (body mass index). BMI is a measurement that tells if you are at a healthy weight.  Waist circumference. This measures the distance around your waistline. This measurement also tells if you are at a healthy weight and may help predict your risk of certain diseases, such as type 2 diabetes and high blood pressure.  Heart rate and blood pressure.  Body temperature.  Skin for abnormal spots.  What immunizations do I need?    Vaccines are usually given at various ages, according to a schedule. Your health care provider will recommend vaccines for you based on your age, medical history, and lifestyle or other factors, such as travel or where you work.  What tests do I need?  Screening  Your health care provider may recommend screening tests for certain conditions. This may include:  Lipid and cholesterol levels.  Diabetes screening. This is done by checking your blood sugar (glucose) after you have not eaten for a while (fasting).  Pelvic exam and Pap test.  Hepatitis B test.  Hepatitis C  test.  HIV (human immunodeficiency virus) test.  STI (sexually transmitted infection) testing, if you are at risk.  Lung cancer screening.  Colorectal cancer screening.  Mammogram. Talk with your health care provider about when you should start having regular mammograms. This may depend on whether you have a family history of breast cancer.  BRCA-related cancer screening. This may be done if you have a family history of breast, ovarian, tubal, or peritoneal cancers.  Bone density scan. This is done to screen for osteoporosis.  Talk with your health care provider about your test results, treatment options, and if necessary, the need for more tests.  Follow these instructions at home:  Eating and drinking    Eat a diet that includes fresh fruits and vegetables, whole grains, lean protein, and low-fat dairy products.  Take vitamin and mineral supplements as recommended by your health care provider.  Do not drink alcohol if:  Your health care provider tells you not to drink.  You are pregnant, may be pregnant, or are planning to become pregnant.  If you drink alcohol:  Limit how much you have to 0-1 drink a day.  Know how much alcohol is in your drink. In the [place] one drink equals one 12 oz bottle of beer (355 mL), one 5 oz glass of wine (148 mL), or one 1 oz glass of hard liquor (44 mL).  Lifestyle  Brush your teeth every morning and night with fluoride toothpaste. Floss one time each day.  Exercise for at least  30 minutes 5 or more days each week.  Do not use any products that contain nicotine or tobacco. These products include cigarettes, chewing tobacco, and vaping devices, such as e-cigarettes. If you need help quitting, ask your health care provider.  Do not use drugs.  If you are sexually active, practice safe sex. Use a condom or other form of protection to prevent STIs.  If you do not wish to become pregnant, use a form of birth control. If you plan to become pregnant, see your health care provider for a  prepregnancy visit.  Take aspirin only as told by your health care provider. Make sure that you understand how much to take and what form to take. Work with your health care provider to find out whether it is safe and beneficial for you to take aspirin daily.  Find healthy ways to manage stress, such as:  Meditation, yoga, or listening to music.  Journaling.  Talking to a trusted person.  Spending time with friends and family.  Minimize exposure to UV radiation to reduce your risk of skin cancer.  Safety  Always wear your seat belt while driving or riding in a vehicle.  Do not drive:  If you have been drinking alcohol. Do not ride with someone who has been drinking.  When you are tired or distracted.  While texting.  If you have been using any mind-altering substances or drugs.  Wear a helmet and other protective equipment during sports activities.  If you have firearms in your house, make sure you follow all gun safety procedures.  Seek help if you have been physically or sexually abused.  What's next?  Visit your health care provider once a year for an annual wellness visit.  Ask your health care provider how often you should have your eyes and teeth checked.  Stay up to date on all vaccines.  This information is not intended to replace advice given to you by your health care provider. Make sure you discuss any questions you have with your health care provider.  Document Revised: 07/26/2020 Document Reviewed: 07/26/2020  Elsevier Patient Education  2024 [employer].

## 2023-09-05 NOTE — Progress Notes (Signed)
 Complete physical exam  Patient: Mary Cruz   DOB: [birth year]   47 y.o. Female  MRN: [id number]  Subjective:    Chief Complaint  Patient presents with   Annual Exam    Mary Cruz is a 48 y.o. female who presents today for a complete physical exam. She reports consuming a general diet. Joining the [employer] to do [employer]. She generally feels well. She reports sleeping well. She does not have additional problems to discuss today.    Most recent fall risk assessment:    09/05/2023    2:43 PM  Fall Risk   Falls in the past year? 1  Number falls in past yr: 1  Injury with Fall? 1  Risk for fall due to : History of fall(s)  Follow up Falls evaluation completed     Most recent depression screenings:    09/05/2023    2:43 PM 03/07/2023    9:40 AM  PHQ 2/9 Scores  PHQ - 2 Score 0 2  PHQ- 9 Score 0 11    Vision:Within last year and Dental: Current dental problems and No regular dental care     Patient Care Team: Willo Mini, NP as PCP - General (Nurse Practitioner)   Outpatient Medications Prior to Visit  Medication Sig   clotrimazole -betamethasone  (LOTRISONE ) cream Apply 1 Application topically daily. Use up to 14 days   famotidine  (PEPCID ) 20 MG tablet Take 1 tablet (20 mg total) by mouth 2 (two) times daily.   hyoscyamine  (LEVSIN  SL) 0.125 MG SL tablet Place 1 tablet (0.125 mg total) under the tongue daily as needed.   meclizine  (ANTIVERT ) 25 MG tablet Take 1 tablet (25 mg total) by mouth 3 (three) times daily as needed for dizziness.   metoprolol  tartrate (LOPRESSOR ) 25 MG tablet Take 0.5 tablets (12.5 mg total) by mouth 2 (two) times daily.   naloxone  (NARCAN ) nasal spray 4 mg/0.1 mL Place 1 spray into the nose as needed for up to 365 doses (for opioid-induced respiratory depresssion). In case of emergency (overdose), spray once into each nostril. If no response within 3 minutes, repeat application and call 911.   Naltrexone -buPROPion  HCl ER 8-90 MG TB12 1  tab daily for week 1, then 1 tab BID for week 2, then 2 tab PO qAM and 1 tab PO qPM for week 3, then 2 tabs BID.   naratriptan  (AMERGE) 2.5 MG tablet Take 1 tablet (2.5 mg total) by mouth as needed for migraine. Take one (1) tablet at onset of headache; if returns or does not resolve, may repeat after 4 hours; do not exceed five (5) mg in 24 hours.   nortriptyline  (PAMELOR ) 10 MG capsule Take 3 capsules (30 mg total) by mouth at bedtime.   ondansetron  (ZOFRAN ) 4 MG tablet Take 1 tablet (4 mg total) by mouth every 8 (eight) hours as needed for nausea or vomiting.   Probiotic Product (ALIGN PO) Take 1 tablet by mouth daily.   promethazine -dextromethorphan (PROMETHAZINE -DM) 6.25-15 MG/5ML syrup Take 5 mLs by mouth 4 (four) times daily as needed for cough.   Respiratory Therapy Supplies (FULL KIT NEBULIZER SET) MISC Nebulizer machine of patient's choice, please include tubes and hoses as needed.  Use as directed.   [DISCONTINUED] busPIRone  (BUSPAR ) 5 MG tablet Take 1-3 tablets (5-15 mg total) by mouth 2 (two) times daily as needed.   [DISCONTINUED] DULoxetine  (CYMBALTA ) 60 MG capsule Take 1 capsule (60 mg total) by mouth 2 (two) times daily.   [DISCONTINUED] Fezolinetant  (  VEOZAH ) 45 MG TABS Take 1 tablet (45 mg total) by mouth daily.   [DISCONTINUED] levocetirizine (XYZAL ) 5 MG tablet Take 1 tablet (5 mg total) by mouth every evening.   [DISCONTINUED] lisinopril -hydrochlorothiazide  (ZESTORETIC ) 10-12.5 MG tablet Take 1 tablet by mouth daily.   [DISCONTINUED] Methocarbamol  1000 MG TABS Take 1,000 mg by mouth 2 (two) times daily as needed.   [DISCONTINUED] pantoprazole  (PROTONIX ) 40 MG tablet Take 1 tablet (40 mg total) by mouth daily.   [DISCONTINUED] rOPINIRole  (REQUIP ) 0.25 MG tablet Take 1 tablet (0.25 mg total) by mouth at bedtime.   [DISCONTINUED] albuterol  (PROVENTIL ) (2.5 MG/3ML) 0.083% nebulizer solution Take 3 mLs (2.5 mg total) by nebulization every 4 (four) hours as needed for wheezing or  shortness of breath. (Patient not taking: Reported on 06/05/2023)   [DISCONTINUED] albuterol  (VENTOLIN  HFA) 108 (90 Base) MCG/ACT inhaler Inhale 2 puffs into the lungs every 6 (six) hours as needed for wheezing or shortness of breath. (Patient not taking: Reported on 06/05/2023)   [DISCONTINUED] budesonide  (PULMICORT ) 0.25 MG/2ML nebulizer solution USE 2 ML(0.25 MG) VIA NEBULIZER IN THE MORNING AND AT BEDTIME (Patient not taking: Reported on 06/05/2023)   No facility-administered medications prior to visit.    Review of Systems  Constitutional:  Negative for chills, fever, malaise/fatigue and weight loss.  HENT:  Negative for congestion, ear pain, hearing loss, sinus pain and sore throat.   Eyes:  Negative for blurred vision, photophobia and pain.  Respiratory:  Negative for cough, shortness of breath and wheezing.   Cardiovascular:  Positive for leg swelling. Negative for chest pain and palpitations.  Gastrointestinal:  Negative for abdominal pain, constipation, diarrhea, heartburn, nausea and vomiting.  Genitourinary:  Negative for dysuria, frequency and urgency.  Musculoskeletal:  Positive for joint pain. Negative for falls and neck pain.  Skin:  Negative for itching and rash.  Neurological:  Positive for dizziness and headaches. Negative for weakness.  Endo/Heme/Allergies:  Negative for polydipsia. Does not bruise/bleed easily.  Psychiatric/Behavioral:  Negative for depression, substance abuse and suicidal ideas. The patient is not nervous/anxious and does not have insomnia.       Objective:     BP 122/80 (BP Location: Right Arm, Cuff Size: Large)   Pulse 99   Resp 20   Ht 5' 3.5 (1.613 m)   Wt 277 lb 3.2 oz (125.7 kg)   SpO2 95%   BMI 48.33 kg/m    Physical Exam Vitals reviewed.  Constitutional:      General: She is not in acute distress.    Appearance: Normal appearance. She is obese. She is not ill-appearing.  HENT:     Head: Normocephalic and atraumatic.     Right Ear:  Tympanic membrane, ear canal and external ear normal. There is no impacted cerumen.     Left Ear: Tympanic membrane, ear canal and external ear normal. There is no impacted cerumen.     Nose: Nose normal. No congestion or rhinorrhea.     Mouth/Throat:     Mouth: Mucous membranes are moist.     Pharynx: No oropharyngeal exudate or posterior oropharyngeal erythema.  Eyes:     General: No scleral icterus.       Right eye: No discharge.        Left eye: No discharge.     Extraocular Movements: Extraocular movements intact.     Conjunctiva/sclera: Conjunctivae normal.     Pupils: Pupils are equal, round, and reactive to light.  Neck:     Thyroid: No thyromegaly.  Vascular: No carotid bruit or JVD.     Trachea: Trachea normal.  Cardiovascular:     Rate and Rhythm: Normal rate and regular rhythm.     Pulses: Normal pulses.     Heart sounds: Normal heart sounds. No murmur heard.    No friction rub. No gallop.  Pulmonary:     Effort: Pulmonary effort is normal. No respiratory distress.     Breath sounds: Normal breath sounds. No wheezing.  Abdominal:     General: Bowel sounds are normal. There is no distension.     Palpations: Abdomen is soft.     Tenderness: There is no abdominal tenderness. There is no guarding.  Musculoskeletal:        General: Normal range of motion.     Cervical back: Normal range of motion and neck supple.  Lymphadenopathy:     Cervical: No cervical adenopathy.  Skin:    General: Skin is warm and dry.  Neurological:     Mental Status: She is alert and oriented to person, place, and time.     Cranial Nerves: No cranial nerve deficit.  Psychiatric:        Mood and Affect: Mood normal.        Behavior: Behavior normal.        Thought Content: Thought content normal.        Judgment: Judgment normal.      Results for orders placed or performed in visit on 09/05/23  Hemoglobin A1c  Result Value Ref Range   Hgb A1c MFr Bld 5.7 (H) 4.8 - 5.6 %   Est.  average glucose Bld gHb Est-mCnc 117 mg/dL  Lipid panel  Result Value Ref Range   Cholesterol, Total 208 (H) 100 - 199 mg/dL   Triglycerides 882 0 - 149 mg/dL   HDL 48 >60 mg/dL   VLDL Cholesterol Cal 21 5 - 40 mg/dL   LDL Chol Calc (NIH) 860 (H) 0 - 99 mg/dL   Chol/HDL Ratio 4.3 0.0 - 4.4 ratio  VITAMIN D  25 Hydroxy (Vit-D Deficiency, Fractures)  Result Value Ref Range   Vit D, 25-Hydroxy 25.9 (L) 30.0 - 100.0 ng/mL       Assessment & Plan:    Routine Health Maintenance and Physical Exam  Immunization History  Administered Date(s) Administered   Influenza,inj,Quad PF,6+ Mos 10/22/2013, 10/27/2014   Pneumococcal Polysaccharide-23 10/11/2013   Tdap 05/06/2020    Health Maintenance  Topic Date Due   Hepatitis B Vaccines (1 of 3 - 19+ 3-dose series) Never done   Pneumococcal Vaccine 70-74 Years old (2 of 2 - PCV) 10/12/2014   Hepatitis C Screening  09/04/2024 (Originally 11/07/1993)   INFLUENZA VACCINE  09/12/2023   Colonoscopy  02/21/2024   Cervical Cancer Screening (HPV/Pap Cotest)  10/24/2027   DTaP/Tdap/Td (2 - Td or Tdap) 05/07/2030   HIV Screening  Completed   HPV VACCINES  Aged Out   Meningococcal B Vaccine  Aged Out   COVID-19 Vaccine  Discontinued    Discussed health benefits of physical activity, and encouraged her to engage in regular exercise appropriate for her age and condition.  1. Annual physical exam (Primary) Up-to-date on preventative care.  Recent labs with CBC and CMP reviewed and no concerns.  Wellness information provided with AVS.  2. Prediabetes Checking hemoglobin A1c. - Hemoglobin A1c  3. Morbid obesity (HCC) Continue weight loss program and start water aerobics at the [facility] as discussed.  4. Mixed hyperlipidemia Checking lipids. - Lipid panel  5. Generalized anxiety disorder 6. Anxiety and depression Stable.  Continue Cymbalta  twice daily.  Continue BuSpar  as prescribed. - DULoxetine  (CYMBALTA ) 60 MG capsule; Take 1 capsule (60 mg  total) by mouth 2 (two) times daily.  Dispense: 180 capsule; Refill: 3 - busPIRone  (BUSPAR ) 5 MG tablet; Take 1-3 tablets (5-15 mg total) by mouth 2 (two) times daily as needed.  Dispense: 180 tablet; Refill: 3  7. Vasomotor symptoms due to menopause Discontinue Veozah  per patient request.  8. Essential hypertension, benign Blood pressure stable and at goal today.  Continue lisinopril -HCTZ 10-12.5 mg daily. - lisinopril -hydrochlorothiazide  (ZESTORETIC ) 10-12.5 MG tablet; Take 1 tablet by mouth daily.  Dispense: 90 tablet; Refill: 3  9. RLS (restless legs syndrome) Well-controlled with Requip .  Continue 0.25 mg nightly. - rOPINIRole  (REQUIP ) 0.25 MG tablet; Take 1 tablet (0.25 mg total) by mouth at bedtime.  Dispense: 90 tablet; Refill: 3  10. Chronic pain syndrome Has stopped seeing her pain clinic but notes that she is feeling well since starting the weight loss program.  Continue methocarbamol  as prescribed. - Methocarbamol  1000 MG TABS; Take 1,000 mg by mouth 2 (two) times daily as needed.  Dispense: 180 tablet; Refill: 3  11. Gastroesophageal reflux disease, unspecified whether esophagitis present Stable.  Continue Protonix  as prescribed. - pantoprazole  (PROTONIX ) 40 MG tablet; Take 1 tablet (40 mg total) by mouth daily.  Dispense: 90 tablet; Refill: 3  12. Vitamin D  deficiency Check vitamin D  today. - VITAMIN D  25 Hydroxy (Vit-D Deficiency, Fractures)   Return in about 6 months (around 03/07/2024) for chronic disease follow up.   Nikita Humble, NP

## 2023-09-06 LAB — LIPID PANEL
Chol/HDL Ratio: 4.3 ratio (ref 0.0–4.4)
Cholesterol, Total: 208 mg/dL — ABNORMAL HIGH (ref 100–199)
HDL: 48 mg/dL (ref >39)
LDL Chol Calc (NIH): 139 mg/dL — ABNORMAL HIGH (ref 0–99)
Triglycerides: 117 mg/dL (ref 0–149)
VLDL Cholesterol Cal: 21 mg/dL (ref 5–40)

## 2023-09-06 LAB — VITAMIN D 25 HYDROXY (VIT D DEFICIENCY, FRACTURES): Vit D, 25-Hydroxy: 25.9 ng/mL — ABNORMAL LOW (ref 30.0–100.0)

## 2023-09-06 LAB — HEMOGLOBIN A1C
Est. average glucose Bld gHb Est-mCnc: 117 mg/dL
Hgb A1c MFr Bld: 5.7 % — ABNORMAL HIGH (ref 4.8–5.6)

## 2023-09-08 ENCOUNTER — Ambulatory Visit: Payer: Self-pay | Admitting: Medical-Surgical

## 2023-09-12 ENCOUNTER — Other Ambulatory Visit (HOSPITAL_COMMUNITY)

## 2023-09-21 ENCOUNTER — Other Ambulatory Visit: Payer: Self-pay

## 2023-09-24 ENCOUNTER — Other Ambulatory Visit: Payer: Self-pay

## 2023-09-25 ENCOUNTER — Other Ambulatory Visit: Payer: Self-pay | Admitting: Medical-Surgical

## 2023-09-25 DIAGNOSIS — I10 Essential (primary) hypertension: Secondary | ICD-10-CM

## 2023-10-18 ENCOUNTER — Encounter: Payer: Self-pay | Admitting: Medical-Surgical

## 2023-10-20 MED ORDER — FLUCONAZOLE 150 MG PO TABS: 150.0000 mg | ORAL_TABLET | Freq: Once | ORAL | 0 refills | Status: AC

## 2023-11-12 DIAGNOSIS — G8929 Other chronic pain: Secondary | ICD-10-CM | POA: Diagnosis not present

## 2023-11-12 DIAGNOSIS — M25551 Pain in right hip: Secondary | ICD-10-CM | POA: Diagnosis not present

## 2023-11-22 ENCOUNTER — Other Ambulatory Visit: Payer: Self-pay | Admitting: Medical-Surgical

## 2023-11-22 ENCOUNTER — Encounter: Payer: Self-pay | Admitting: Medical-Surgical

## 2023-11-22 DIAGNOSIS — G894 Chronic pain syndrome: Secondary | ICD-10-CM

## 2023-11-24 MED ORDER — METHOCARBAMOL 500 MG PO TABS: 1000.0000 mg | ORAL_TABLET | Freq: Two times a day (BID) | ORAL | 3 refills | Status: AC | PRN

## 2023-12-04 ENCOUNTER — Other Ambulatory Visit: Payer: Self-pay

## 2023-12-04 ENCOUNTER — Ambulatory Visit

## 2023-12-04 VITALS — BP 110/86 | HR 81 | Ht 63.5 in | Wt 258.0 lb

## 2023-12-04 DIAGNOSIS — I10 Essential (primary) hypertension: Secondary | ICD-10-CM | POA: Diagnosis not present

## 2023-12-04 DIAGNOSIS — R55 Syncope and collapse: Secondary | ICD-10-CM

## 2023-12-04 DIAGNOSIS — I351 Nonrheumatic aortic (valve) insufficiency: Secondary | ICD-10-CM | POA: Diagnosis not present

## 2023-12-04 DIAGNOSIS — I4711 Inappropriate sinus tachycardia, so stated: Secondary | ICD-10-CM | POA: Diagnosis not present

## 2023-12-04 DIAGNOSIS — E782 Mixed hyperlipidemia: Secondary | ICD-10-CM

## 2023-12-04 MED ORDER — LISINOPRIL 10 MG PO TABS: 10.0000 mg | ORAL_TABLET | Freq: Every day | ORAL | 3 refills | Status: AC

## 2023-12-04 NOTE — Assessment & Plan Note (Signed)
 Blood pressure is well-controlled. On the lower end of normal. She did report 2 syncopal episodes which appeared situational in nature associated with laughter.  Discontinue hydrochlorothiazide . Continue lisinopril  10 mg once daily. Target blood pressure below 130/80 mmHg.

## 2023-12-04 NOTE — Progress Notes (Signed)
 Cardiology Consultation:    Date:  12/04/2023   ID:  Mary Cruz, DOB Jun 21, 1975, MRN [id number]  PCP:  Mary Mini, NP  Cardiologist:  Mary SAUNDERS [name], MD   Referring MD: Mary Mini, NP   Chief Complaint  Patient presents with   Annual Exam     ASSESSMENT AND PLAN:   Mary Cruz 48 year old woman history of postural dizziness, syncopal episodes which appeared vasovagal in description in the past, elevated average heart rate 93/min on 7-day Zio patch from August 2024 and patient triggered symptoms correlating mostly with sinus tachycardia.  Normal biventricular function on echocardiogram November 22, 2022 with mild aortic insufficiency. Also has history of hypertension, hyperlipidemia, prediabetes, irritable bowel syndrome, fibromyalgia, morbid obesity, restless leg syndrome, chronic back pain and hip pain limiting ambulation, former smoker quit in March 2023. She had mild abnormal features reported on MRI of the brain and is pending further evaluation for any early signs of ALS with neurology follow-up at [facility] coming up in December.   Problem List Items Addressed This Visit     Mixed hyperlipidemia   Lipid panel from 09/05/2023 reviewed. Total cholesterol still elevated 208, triglycerides 117, HDL 48 and LDL 139. She is aggressively modifying her diet and targeting weight loss with good success. Will hold off on lipid-lowering therapy for now..  The 10-year ASCVD risk score (Arnett DK, et al., 2019) is: 1.3%   Values used to calculate the score:     Age: 77 years     Clincally relevant sex: Female     Is Non-Hispanic African American: No     Diabetic: No     Tobacco smoker: No     Systolic Blood Pressure: 110 mmHg     Is BP treated: Yes     HDL Cholesterol: 48 mg/dL     Total Cholesterol: 208 mg/dL       Hypertension - Primary   Blood pressure is well-controlled. On the lower end of normal. She did report 2 syncopal episodes which appeared situational in  nature associated with laughter.  Discontinue hydrochlorothiazide . Continue lisinopril  10 mg once daily. Target blood pressure below 130/80 mmHg.       Relevant Orders   EKG 12-Lead (Completed)   Syncope and collapse   She has history of vasovagal episode related syncope in the past. More recently over the past 4 months she has had 2 episodes of syncope induced by excessive laughter. Consistent with situational syncope.  Will decrease her dose of antihypertensive. Discontinue lisinopril -hydrochlorothiazide  combination. Continue lisinopril  10 mg once daily.  Encouraged to keep herself well-hydrated. If she does continue to have recurrent episodes we will consider repeat assessment with either a Zio patch monitor or loop recorder depending on frequency of episodes.      Mild aortic insufficiency by TTE 11/22/2022   Will follow-up repeat echocardiogram tentatively in 6 months for any significant interval change.       Inappropriate sinus tachycardia (HCC) 7d zio patch 10/11/2022 avg HR 93/min, sx correlating with sinus tachycardia   Tolerating low-dose of metoprolol  XL 12.5 mg once daily.          History of Present Illness:    Mary Cruz is a 48 y.o. female who is being seen today for follow-up visit. PCP is Mary Mini, NP. Last visit with me in the office was 10/21/2022.  history of postural dizziness, syncopal episodes which appeared vasovagal in description in the past, elevated average heart rate 93/min on 7-day Zio  patch from August 2024 and patient triggered symptoms correlating mostly with sinus tachycardia.  Normal biventricular function on echocardiogram November 22, 2022 with mild aortic insufficiency. Also has history of hypertension, hyperlipidemia, prediabetes, irritable bowel syndrome, fibromyalgia, morbid obesity, restless leg syndrome, chronic back pain and hip pain limiting ambulation, former smoker quit in March 2023   Here for the visit today by  herself. Has been sticking with her healthy diet and targeting weight loss.  Down over 20 pounds since her last visit. She wants to lose another 5 pounds in order to facilitate right hip surgery, tentatively being planned with Dr.Hartzler at [facility].  She reports 2 episodes of syncope that occurred in the past 4 months.  Last episode was over a month ago.  Describes both precipitated by intense laughter, where she felt she was losing control of her body and lost bladder and bowel control and briefly lost consciousness.  Was tired and fatigued for an extended time through the day following both these episodes. Occasional lightheadedness on sudden change of position.  No chest pain, SOB, orthopnea, PND. No blood in urine or stools.   Tolerating the dose of metoprolol  prescribed.  EKG in the clinic today shows sinus rhythm heart rate 81/min, PR interval 154 ms, QRS duration 96 ms.  No ischemic changes.  In comparison to prior EKG from September 2024 showed higher heart rates 99/min.  Lipid panel from 09/05/2023 notes total cholesterol 208, triglycerides 117, HDL 48, LDL 139. Hemoglobin A1c 5.7   Past Medical History:  Diagnosis Date   Abnormal MRI, lumbar spine (11/04/2021) 11/07/2021   (11/04/2021) LUMBAR MRI FINDINGS:  DISC LEVELS:  L4-L5: Mild facet arthropathy. Left far lateral/extraforaminal disc protrusion with mild resulting left foraminal stenosis.  L5-S1: Small left foraminal disc protrusion. Bilateral facet arthropathy.     IMPRESSION:  Lower lumbar degenerative change without impingement.     Abnormal MRI, shoulder (Right) 11/15/2018   FINDINGS:  Rotator cuff: Mild to moderate supraspinatus tendinopathy potentially with mild partial-thickness bursal surface tearing of the supraspinatus. Mild infraspinatus tendinopathy.  Muscles:  Unremarkable  Biceps long head:  Unremarkable  Acromioclavicular Joint: Mild degenerative spurring with a small amount of fluid signal in the [facility] joint.  Type I acromion. 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 08/19/2022   Cervicalgia 11/15/2018   Chronic cervical radicular pain (C7 Dermatome) (Bilateral) (L>R) 12/19/2014   Chronic feet pain (Bilateral) 10/30/2020   Chronic hip pain (3ry area of Pain) (Bilateral) (R>L) 08/20/2018   Chronic hip pain (Right) 06/13/2021   Chronic low back pain (Bilateral) (R>L) w/ sciatica (Bilateral) (R>L) 06/21/2019   Chronic low back pain (Bilateral) (R>L) w/o sciatica 12/19/2014   Chronic lower extremity pain (Bilateral) (R>L) 12/19/2014   Chronic lower extremity pain (Right) 06/21/2019   Chronic lumbar radicular pain (Right L5 dermatome; Left S1 Dermatome) (Bilateral) (R>L) 12/19/2014   Right L5 radicular pain. Left S1 radicular pain.     Chronic migraine w/o aura w/o status migrainosus, not intractable 05/15/2022   Chronic musculoskeletal pain 12/19/2014   Chronic neck pain (1ry area of Pain) (Bilateral) (L>R) 12/19/2014   Chronic pain syndrome 07/09/2013   Chronic upper extremity pain (2ry area of Pain) (Bilateral) (L>R) 08/20/2018   Chronic use of opiate for therapeutic purpose 06/27/2020   Complaints of weakness  of lower extremities (Bilateral) 11/07/2021   DDD (degenerative disc disease), cervical 11/15/2018   DDD (degenerative disc disease), lumbar 02/25/2019   Disorder of skeletal system 08/20/2018   Dropfoot (Right) 06/21/2019   Essential hypertension, benign 07/09/2013   Fibromyalgia    Gastroesophageal reflux disease 07/02/2007   Qualifier: Diagnosis of   By: Ezzard CMA (AAMA), Patty         Generalized anxiety disorder 03/16/2014   Grade 1 Retrolisthesis of L4/L5 07/31/2021   Hypertension    Inappropriate sinus tachycardia (HCC) 7d zio patch 10/11/2022 avg HR 93/min, sx correlating  with sinus tachycardia 11/25/2022   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 pain 06/13/2022   Interstitial cystitis 05/04/2020   Intractable low back pain 04/25/2020   Irritable bowel syndrome with both constipation and diarrhea 07/02/2007   Qualifier: Diagnosis of   By: Ezzard CMA (AAMA), Patty         Latex precautions, history of latex allergy 04/08/2019   Long term prescription opiate use 12/19/2014   Loss of consciousness (HCC) 06/13/2022   Lower extremity numbness (Bilateral) 11/07/2021   Lumbar facet arthropathy 06/21/2019   Lumbar facet syndrome (Bilateral) (R>L) 06/29/2015   Lumbar spondylosis 06/29/2015   Lumbosacral radiculopathy at L5 (Right) 06/21/2019   Lumbosacral radiculopathy/radiculitis at L4 (Right) 07/31/2021   Metabolic syndrome 08/04/2020   Migraines 11/12/2019   Mild aortic insufficiency by TTE 11/21/2012 11/25/2022   Mixed hyperlipidemia 08/04/2020   Morbid obesity (HCC) 12/19/2014   Neurogenic pain 10/10/2016   Nocturia more than twice per night 06/13/2022   Osteoarthritis    Osteoarthritis involving multiple joints 04/08/2019   Osteoarthritis of hip (Right) 07/16/2021   Osteoarthrosis 12/19/2014   Other fatigue 06/05/2023   Pain    chronic regional pain syndrome   Pain in right knee 03/19/2017   Paresthesia 02/05/2022   Perimenopausal vasomotor symptoms 05/22/2022   Pharmacologic therapy 08/20/2018   Positive ANA (antinuclear antibody) 09/13/2015   Following with Rosaline Salt, PA at [facility] rheumatology. Recently seen for second opinion 10/03/2016 for evaluation of connective tissue disease and/or lupus. Prior serology show positive ANA without positive lupus specific antibodies.SABRA Response to Plaquenil and as needed prednisone  in the past. Intends do not meet clinical criteria for lupus. If specific ANA panel negative, no further workup   Postural dizziness 10/21/2022   Prediabetes 10/19/2020   Presence of functional implant (Medtronic Lumbar spinal cord  stimulator implant) 12/19/2014   Non-MRI compatible system. 08/2007.     Refractory chronic cough 06/13/2022   Restless leg syndrome 10/06/2022   Sleep apnea in adult 05/01/2023   Spinal cord stimulator status 05/16/2016   Spondylolisthesis at L4-L5 level 07/31/2021   Spondylosis without myelopathy or radiculopathy, lumbosacral region 02/25/2019   Syncope and collapse 10/21/2022   Uncomplicated opioid dependence (HCC) 12/19/2014   Vitamin D  insufficiency 12/19/2014    Past Surgical History:  Procedure Laterality Date   ANKLE SURGERY     APPENDECTOMY     BREAST SURGERY     CHOLECYSTECTOMY  2000   COLONOSCOPY N/A 02/20/2021   Procedure: COLONOSCOPY;  Surgeon: Maryruth Ole DASEN, MD;  Location: [facility] ENDOSCOPY;  Service: Endoscopy;  Laterality: N/A;   COLONOSCOPY WITH ESOPHAGOGASTRODUODENOSCOPY (EGD)     ENDOMETRIAL ABLATION     ESOPHAGOGASTRODUODENOSCOPY N/A 02/20/2021   Procedure: ESOPHAGOGASTRODUODENOSCOPY (EGD);  Surgeon: Maryruth Ole DASEN, MD;  Location: [facility] END[facility];  Service: Endoscopy;  Laterality: N/A;   KNEE SURGERY     KNEE SURGERY Right 02/2015   LAPAROSCOPIC APPENDECTOMY Right 01/01/2020   Procedure:  APPENDECTOMY LAPAROSCOPIC;  Surgeon: Ebbie Cough, MD;  Location: [facility] ORS;  Service: General;  Laterality: Right;   OCCIPITAL NERVE STIMULATOR INSERTION     SHOULDER ARTHROSCOPY WITH DISTAL CLAVICLE RESECTION Right 10/29/2018   Procedure: SHOULDER ARTHROSCOPY WITH DISTAL CLAVICLE RESECTION;  Surgeon: Cristy Bonner DASEN, MD;  Location: [facility];  Service: Orthopedics;  Laterality: Right;   SHOULDER SURGERY     spinal cord stimulator     MRI info: [email or web address] 1.5T Full body eligible   SPINAL CORD STIMULATOR IMPLANT  2008   SPINAL CORD STIMULATOR INSERTION N/A 06/21/2016   Procedure: LUMBAR SPINAL CORD STIMULATOR INSERTION;  Surgeon: Mindi Mt, MD;  Location: [facility] OR;  Service:  Neurosurgery;  Laterality: N/A;  LUMBAR SPINAL CORD STIMULATOR INSERTION   SUBACROMIAL DECOMPRESSION Right 10/29/2018   Procedure: SUBACROMIAL DECOMPRESSION;  Surgeon: Cristy Bonner DASEN, MD;  Location: [facility];  Service: Orthopedics;  Laterality: Right;   TUBAL LIGATION  2000    Current Medications: Current Meds  Medication Sig   busPIRone  (BUSPAR ) 5 MG tablet Take 1-3 tablets (5-15 mg total) by mouth 2 (two) times daily as needed.   clotrimazole -betamethasone  (LOTRISONE ) cream Apply 1 Application topically daily. Use up to 14 days   hyoscyamine  (LEVSIN  SL) 0.125 MG SL tablet Place 1 tablet (0.125 mg total) under the tongue daily as needed.   levocetirizine (XYZAL ) 5 MG tablet Take 1 tablet (5 mg total) by mouth every evening.   meclizine  (ANTIVERT ) 25 MG tablet Take 1 tablet (25 mg total) by mouth 3 (three) times daily as needed for dizziness.   methocarbamol  (ROBAXIN ) 500 MG tablet Take 2 tablets (1,000 mg total) by mouth 2 (two) times daily as needed for muscle spasms.   metoprolol  tartrate (LOPRESSOR ) 25 MG tablet TAKE 1/2 TABLET(12.5 MG) BY MOUTH TWICE DAILY   Misc Natural Products (SLENDER HERB DIET FORMULA PO) Take 15 drops by mouth 3 (three) times daily.   Naltrexone -buPROPion  HCl ER 8-90 MG TB12 1 tab daily for week 1, then 1 tab BID for week 2, then 2 tab PO qAM and 1 tab PO qPM for week 3, then 2 tabs BID.   naratriptan  (AMERGE) 2.5 MG tablet Take 1 tablet (2.5 mg total) by mouth as needed for migraine. Take one (1) tablet at onset of headache; if returns or does not resolve, may repeat after 4 hours; do not exceed five (5) mg in 24 hours.   ondansetron  (ZOFRAN ) 4 MG tablet Take 1 tablet (4 mg total) by mouth every 8 (eight) hours as needed for nausea or vomiting.   OVER THE COUNTER MEDICATION Take 2 drops by mouth 2 (two) times daily. XCELER8 for weight loss   pantoprazole  (PROTONIX ) 40 MG tablet Take 1 tablet (40 mg total) by mouth daily.   Probiotic Product (ALIGN  PO) Take 1 tablet by mouth daily.   promethazine -dextromethorphan (PROMETHAZINE -DM) 6.25-15 MG/5ML syrup Take 5 mLs by mouth 4 (four) times daily as needed for cough.   Respiratory Therapy Supplies (FULL KIT NEBULIZER SET) MISC Nebulizer machine of patient's choice, please include tubes and hoses as needed.  Use as directed.   rOPINIRole  (REQUIP ) 0.25 MG tablet Take 1 tablet (0.25 mg total) by mouth at bedtime.   [DISCONTINUED] lisinopril -hydrochlorothiazide  (ZESTORETIC ) 10-12.5 MG tablet Take 1 tablet by mouth daily.     Allergies:   Celecoxib, Doxycycline , Gabapentin, Latex, Nabumetone, Pentazocine lactate, Seroquel [quetiapine], Amoxicillin , Bactrim [sulfamethoxazole-trimethoprim], Cephalexin , Erythromycin, Hydromorphone  hcl, Hydromorphone  hcl, and Lyrica  [pregabalin ]   Social History  Socioeconomic History   Marital status: Married    Spouse name: Not on file   Number of children: Not on file   Years of education: Not on file   Highest education level: Some college, no degree  Occupational History   Not on file  Tobacco Use   Smoking status: Former    Current packs/day: 0.00    Types: Cigarettes    Quit date: 05/06/2020    Years since quitting: 3.5   Smokeless tobacco: Former    Quit date: 12/22/2017  Vaping Use   Vaping status: Every Day   Substances: Nicotine, Flavoring  Substance and Sexual Activity   Alcohol use: No    Alcohol/week: 0.0 standard drinks of alcohol   Drug use: No   Sexual activity: Yes    Partners: Male    Birth control/protection: Surgical  Other Topics Concern   Not on file  Social History Narrative   Not on file   Social Drivers of Health   Financial Resource Strain: Low Risk  (09/05/2023)   Overall Financial Resource Strain (CARDIA)    Difficulty of Paying Living Expenses: Not very hard  Food Insecurity: Food Insecurity Present (09/05/2023)   Hunger Vital Sign    Worried About Running Out of Food in the Last Year: Never true    Ran Out of  Food in the Last Year: Sometimes true  Transportation Needs: No Transportation Needs (09/05/2023)   PRAPARE - Administrator, Civil Service (Medical): No    Lack of Transportation (Non-Medical): No  Physical Activity: Insufficiently Active (09/05/2023)   Exercise Vital Sign    Days of Exercise per Week: 3 days    Minutes of Exercise per Session: 20 min  Stress: No Stress Concern Present (09/05/2023)   [employer] of Occupational Health - Occupational Stress Questionnaire    Feeling of Stress: Only a little  Social Connections: Moderately Isolated (09/05/2023)   Social Connection and Isolation Panel    Frequency of Communication with Friends and Family: More than three times a week    Frequency of Social Gatherings with Friends and Family: Three times a week    Attends Religious Services: Patient declined    Active Member of Clubs or Organizations: No    Attends Engineer, structural: Not on file    Marital Status: Married     Family History: The patient's family history includes Asthma in her mother; Depression in her mother; Fibromyalgia in an other family member; Stroke in an other family member; Thyroid disease in an other family member. ROS:   Please see the history of present illness.    All 14 point review of systems negative except as described per history of present illness.  EKGs/Labs/Other Studies Reviewed:    The following studies were reviewed today:   EKG:  EKG Interpretation Date/Time:  Thursday December 04 2023 13:30:45 EDT Ventricular Rate:  81 PR Interval:  154 QRS Duration:  86 QT Interval:  366 QTC Calculation: 425 R Axis:   10  Text Interpretation: Normal sinus rhythm When compared with ECG of 21-Oct-2022 11:02, No significant change was found Confirmed by Liborio Hai reddy [phone]) on 12/04/2023 1:37:46 PM    Recent Labs: 06/05/2023: ALT 28; BUN 9; Creatinine, Ser 0.92; Hemoglobin 13.7; Platelets 273; Potassium 4.0; Sodium  138; TSH 1.460  Recent Lipid Panel    Component Value Date/Time   CHOL 208 (H) 09/05/2023 1447   TRIG 117 09/05/2023 1447   HDL 48 09/05/2023 1447  CHOLHDL 4.3 09/05/2023 1447   CHOLHDL 3.7 07/23/2021 0000   LDLCALC 139 (H) 09/05/2023 1447   LDLCALC 136 (H) 07/23/2021 0000    Physical Exam:    VS:  BP 110/86   Pulse 81   Ht 5' 3.5 (1.613 m)   Wt 258 lb (117 kg)   SpO2 98%   BMI 44.99 kg/m 70 feet  Wt Readings from Last 3 Encounters:  12/04/23 258 lb (117 kg)  09/05/23 277 lb 3.2 oz (125.7 kg)  06/05/23 276 lb (125.2 kg)     GENERAL:  Well nourished, well developed in no acute distress NECK: No JVD; No carotid bruits CARDIAC: RRR, S1 and S2 present, no murmurs, no rubs, no gallops CHEST:  Clear to auscultation without rales, wheezing or rhonchi  Extremities: No pitting pedal edema. Pulses bilaterally symmetric with radial 2+ and dorsalis pedis 2+ NEUROLOGIC:  Alert and oriented x 3  Medication Adjustments/Labs and Tests Ordered: Current medicines are reviewed at length with the patient today.  Concerns regarding medicines are outlined above.  Orders Placed This Encounter  Procedures   EKG 12-Lead   No orders of the defined types were placed in this encounter.   Signed, Mary jess Kobus, MD, MPH, [facility]. 12/04/2023 2:05 PM    [facility]

## 2023-12-04 NOTE — Patient Instructions (Signed)
 Medication Instructions:  Your physician has recommended you make the following change in your medication:   STOP: Zestoretic  START: Lisinopril  10 mg daily  *If you need a refill on your cardiac medications before your next appointment, please call your pharmacy*  Lab Work: None If you have labs (blood work) drawn today and your tests are completely normal, you will receive your results only by: MyChart Message (if you have MyChart) OR A paper copy in the mail If you have any lab test that is abnormal or we need to change your treatment, we will call you to review the results.  Testing/Procedures: Your physician has requested that you have an echocardiogram. Echocardiography is a painless test that uses sound waves to create images of your heart. It provides your doctor with information about the size and shape of your heart and how well your heart's chambers and valves are working. This procedure takes approximately one hour. There are no restrictions for this procedure. Please do NOT wear cologne, perfume, aftershave, or lotions (deodorant is allowed). Please arrive 15 minutes prior to your appointment time.  Please note: We ask at that you not bring children with you during ultrasound (echo/ vascular) testing. Due to room size and safety concerns, children are not allowed in the ultrasound rooms during exams. Our front office staff cannot provide observation of children in our lobby area while testing is being conducted. An adult accompanying a patient to their appointment will only be allowed in the ultrasound room at the discretion of the ultrasound technician under special circumstances. We apologize for any inconvenience.   Follow-Up: At [facility], you and your health needs are our priority.  As part of our continuing mission to provide you with exceptional heart care, our providers are all part of one team.  This team includes your primary Cardiologist (physician) and  Advanced Practice Providers or APPs (Physician Assistants and Nurse Practitioners) who all work together to provide you with the care you need, when you need it.  Your next appointment:   6 month(s)  Provider:   Alean Kobus, MD    We recommend signing up for the patient portal called MyChart.  Sign up information is provided on this After Visit Summary.  MyChart is used to connect with patients for Virtual Visits (Telemedicine).  Patients are able to view lab/test results, encounter notes, upcoming appointments, etc.  Non-urgent messages can be sent to your provider as well.   To learn more about what you can do with MyChart, go to [email or web address].   Other Instructions None

## 2023-12-04 NOTE — Assessment & Plan Note (Signed)
 Lipid panel from 09/05/2023 reviewed. Total cholesterol still elevated 208, triglycerides 117, HDL 48 and LDL 139. She is aggressively modifying her diet and targeting weight loss with good success. Will hold off on lipid-lowering therapy for now..  The 10-year ASCVD risk score (Arnett DK, et al., 2019) is: 1.3%   Values used to calculate the score:     Age: 48 years     Clincally relevant sex: Female     Is Non-Hispanic African American: No     Diabetic: No     Tobacco smoker: No     Systolic Blood Pressure: 110 mmHg     Is BP treated: Yes     HDL Cholesterol: 48 mg/dL     Total Cholesterol: 208 mg/dL

## 2023-12-04 NOTE — Assessment & Plan Note (Signed)
 Will follow-up repeat echocardiogram tentatively in 6 months for any significant interval change.

## 2023-12-04 NOTE — Assessment & Plan Note (Signed)
 She has history of vasovagal episode related syncope in the past. More recently over the past 4 months she has had 2 episodes of syncope induced by excessive laughter. Consistent with situational syncope.  Will decrease her dose of antihypertensive. Discontinue lisinopril -hydrochlorothiazide  combination. Continue lisinopril  10 mg once daily.  Encouraged to keep herself well-hydrated. If she does continue to have recurrent episodes we will consider repeat assessment with either a Zio patch monitor or loop recorder depending on frequency of episodes.

## 2023-12-04 NOTE — Assessment & Plan Note (Signed)
 Tolerating low-dose of metoprolol  XL 12.5 mg once daily.

## 2023-12-15 ENCOUNTER — Other Ambulatory Visit: Payer: Self-pay | Admitting: Medical Genetics

## 2023-12-15 DIAGNOSIS — Z006 Encounter for examination for normal comparison and control in clinical research program: Secondary | ICD-10-CM

## 2023-12-24 ENCOUNTER — Other Ambulatory Visit: Payer: Self-pay

## 2024-01-12 ENCOUNTER — Other Ambulatory Visit: Payer: Self-pay | Admitting: Medical-Surgical

## 2024-01-12 DIAGNOSIS — Z1231 Encounter for screening mammogram for malignant neoplasm of breast: Secondary | ICD-10-CM

## 2024-01-17 ENCOUNTER — Inpatient Hospital Stay (HOSPITAL_BASED_OUTPATIENT_CLINIC_OR_DEPARTMENT_OTHER): Admission: RE | Admit: 2024-01-17 | Discharge: 2024-01-17 | Attending: Medical-Surgical

## 2024-01-17 DIAGNOSIS — Z1231 Encounter for screening mammogram for malignant neoplasm of breast: Secondary | ICD-10-CM | POA: Diagnosis not present

## 2024-01-18 ENCOUNTER — Encounter: Payer: Self-pay | Admitting: Medical-Surgical

## 2024-01-19 MED ORDER — FLUCONAZOLE 150 MG PO TABS: 150.0000 mg | ORAL_TABLET | Freq: Once | ORAL | 0 refills | Status: AC

## 2024-01-21 ENCOUNTER — Encounter: Payer: Self-pay | Admitting: Neurology

## 2024-01-21 DIAGNOSIS — R9089 Other abnormal findings on diagnostic imaging of central nervous system: Secondary | ICD-10-CM | POA: Diagnosis not present

## 2024-01-21 DIAGNOSIS — G43909 Migraine, unspecified, not intractable, without status migrainosus: Secondary | ICD-10-CM

## 2024-01-21 DIAGNOSIS — R531 Weakness: Secondary | ICD-10-CM | POA: Diagnosis not present

## 2024-01-22 ENCOUNTER — Other Ambulatory Visit: Payer: Self-pay | Admitting: *Deleted

## 2024-01-22 MED ORDER — NARATRIPTAN HCL 2.5 MG PO TABS: 2.5000 mg | ORAL_TABLET | ORAL | 2 refills | Status: AC | PRN

## 2024-01-25 ENCOUNTER — Ambulatory Visit: Payer: Self-pay | Admitting: Medical-Surgical

## 2024-01-27 NOTE — Telephone Encounter (Signed)
Patient called and scheduled an appointment 

## 2024-02-23 MED ORDER — ONDANSETRON 4 MG PO TBDP: 4.0000 mg | ORAL_TABLET | Freq: Three times a day (TID) | ORAL | 5 refills | Status: AC | PRN

## 2024-02-23 NOTE — Telephone Encounter (Signed)
 Meds ordered this encounter  Medications   ondansetron  (ZOFRAN -ODT) 4 MG disintegrating tablet    Sig: Take 1 tablet (4 mg total) by mouth every 8 (eight) hours as needed.    Dispense:  30 tablet    Refill:  5     Please see the MyChart message reply(ies) for my assessment and plan.    This patient gave consent for this Medical Advice Message and is aware that it may result in a bill to [employer], as well as the possibility of receiving a bill for a co-payment or deductible. They are an established patient, but are not seeking medical advice exclusively about a problem treated during an in person or video visit in the last seven days. I did not recommend an in person or video visit within seven days of my reply.    I spent a total of 7 minutes cumulative time within 7 days through [employer].  Modena Callander, MD

## 2024-03-11 ENCOUNTER — Ambulatory Visit: Admitting: Medical-Surgical

## 2024-03-11 ENCOUNTER — Encounter: Payer: Self-pay | Admitting: Medical-Surgical

## 2024-03-11 VITALS — BP 135/74 | HR 60 | Temp 98.1°F | Ht 63.5 in | Wt 250.0 lb

## 2024-03-11 DIAGNOSIS — J40 Bronchitis, not specified as acute or chronic: Secondary | ICD-10-CM

## 2024-03-11 DIAGNOSIS — J329 Chronic sinusitis, unspecified: Secondary | ICD-10-CM

## 2024-03-11 LAB — POC SOFIA 2 FLU + SARS ANTIGEN FIA
Influenza A, POC: NEGATIVE
Influenza B, POC: NEGATIVE
SARS Coronavirus 2 Ag: NEGATIVE

## 2024-03-11 MED ORDER — PROMETHAZINE-DM 6.25-15 MG/5ML PO SYRP: 5.0000 mL | ORAL_SOLUTION | Freq: Four times a day (QID) | ORAL | 0 refills | Status: AC | PRN

## 2024-03-11 MED ORDER — PREDNISONE 20 MG PO TABS: 20.0000 mg | ORAL_TABLET | Freq: Every day | ORAL | 0 refills | Status: AC

## 2024-03-11 MED ORDER — AZITHROMYCIN 250 MG PO TABS: ORAL_TABLET | ORAL | 0 refills | Status: AC

## 2024-03-11 NOTE — Progress Notes (Signed)
" ° °       Established patient visit   History of Present Illness   Discussed the use of AI scribe software for clinical note transcription with the patient, who gave verbal consent to proceed.  History of Present Illness   Mary Cruz is a 49 year old female who presents with upper respiratory symptoms and nausea.  Upper respiratory symptoms - Gradual onset of cough and sneezing since the weekend following exposure to sick coworkers - Facial pain and ear discomfort with muffled, popping, and clicking sensations - No sore throat, shortness of breath, or chest pain - Best symptomatic relief from Sudafed; also taking Tylenol  Cold and Mucinex   Gastrointestinal symptoms - Nausea with vomiting occurred yesterday, resolved today - Mild diarrhea without blood - No blood or coffee-ground material in vomit  Impact on daily activities - Missed one day of work due to symptoms - Considering returning to work soon  Exposure history - Husband has had a similar recent illness      Physical Exam   Physical Exam Vitals reviewed.  Constitutional:      General: She is not in acute distress.    Appearance: Normal appearance. She is not ill-appearing.  HENT:     Head: Normocephalic and atraumatic.  Cardiovascular:     Rate and Rhythm: Normal rate and regular rhythm.     Pulses: Normal pulses.     Heart sounds: Normal heart sounds. No murmur heard.    No friction rub. No gallop.  Pulmonary:     Effort: Pulmonary effort is normal. No respiratory distress.     Breath sounds: Normal breath sounds. No wheezing.  Skin:    General: Skin is warm and dry.  Neurological:     Mental Status: She is alert and oriented to person, place, and time.  Psychiatric:        Mood and Affect: Mood normal.        Behavior: Behavior normal.        Thought Content: Thought content normal.        Judgment: Judgment normal.    Assessment & Plan   Sinobronchitis Symptoms include cough, sneezing, facial  pain, and nasal congestion. Negative for flu and COVID. Allergic to multiple antibiotics; azithromycin  tolerated. Symptoms likely exacerbated by drainage. - Prescribed azithromycin  (Z-Pak). - Prescribed Prednisone  50mg  daily x 5 days.  - Prescribed promethazine  DM for cough and nausea. - Provided work note for absence as discussed.  Follow up   Return if symptoms worsen or fail to improve. __________________________________ Zada FREDRIK Palin, DNP, APRN, FNP-BC [facility] "

## 2024-06-01 ENCOUNTER — Ambulatory Visit: Admitting: Neurology
